# Patient Record
Sex: Male | Born: 1945
Health system: Southern US, Community
[De-identification: ages and names within clinical notes are randomized; demographics above are authoritative.]

## PROBLEM LIST (undated history)

## (undated) DIAGNOSIS — R062 Wheezing: Secondary | ICD-10-CM

## (undated) DIAGNOSIS — F419 Anxiety disorder, unspecified: Secondary | ICD-10-CM

## (undated) DIAGNOSIS — T7840XA Allergy, unspecified, initial encounter: Secondary | ICD-10-CM

## (undated) DIAGNOSIS — K922 Gastrointestinal hemorrhage, unspecified: Secondary | ICD-10-CM

## (undated) DIAGNOSIS — J45909 Unspecified asthma, uncomplicated: Secondary | ICD-10-CM

## (undated) DIAGNOSIS — B9562 Methicillin resistant Staphylococcus aureus infection as the cause of diseases classified elsewhere: Secondary | ICD-10-CM

## (undated) DIAGNOSIS — M109 Gout, unspecified: Secondary | ICD-10-CM

## (undated) DIAGNOSIS — G4730 Sleep apnea, unspecified: Secondary | ICD-10-CM

## (undated) DIAGNOSIS — IMO0001 Reserved for inherently not codable concepts without codable children: Secondary | ICD-10-CM

## (undated) DIAGNOSIS — F32A Depression, unspecified: Secondary | ICD-10-CM

## (undated) DIAGNOSIS — E785 Hyperlipidemia, unspecified: Secondary | ICD-10-CM

## (undated) DIAGNOSIS — I10 Essential (primary) hypertension: Secondary | ICD-10-CM

## (undated) DIAGNOSIS — G4700 Insomnia, unspecified: Secondary | ICD-10-CM

## (undated) DIAGNOSIS — G629 Polyneuropathy, unspecified: Secondary | ICD-10-CM

## (undated) DIAGNOSIS — E669 Obesity, unspecified: Secondary | ICD-10-CM

## (undated) DIAGNOSIS — K219 Gastro-esophageal reflux disease without esophagitis: Secondary | ICD-10-CM

## (undated) DIAGNOSIS — L0390 Cellulitis, unspecified: Secondary | ICD-10-CM

## (undated) DIAGNOSIS — I2510 Atherosclerotic heart disease of native coronary artery without angina pectoris: Secondary | ICD-10-CM

## (undated) DIAGNOSIS — J449 Chronic obstructive pulmonary disease, unspecified: Secondary | ICD-10-CM

## (undated) DIAGNOSIS — E291 Testicular hypofunction: Secondary | ICD-10-CM

## (undated) DIAGNOSIS — M797 Fibromyalgia: Secondary | ICD-10-CM

## (undated) DIAGNOSIS — I509 Heart failure, unspecified: Secondary | ICD-10-CM

## (undated) HISTORY — DX: Testicular hypofunction: E29.1

## (undated) HISTORY — DX: Gout, unspecified: M10.9

## (undated) HISTORY — DX: Wheezing: R06.2

## (undated) HISTORY — DX: Insomnia, unspecified: G47.00

## (undated) HISTORY — DX: Allergy, unspecified, initial encounter: T78.40XA

## (undated) HISTORY — PX: CARDIAC CATHETERIZATION: SHX172

## (undated) HISTORY — DX: Unspecified asthma, uncomplicated: J45.909

## (undated) HISTORY — DX: Sleep apnea, unspecified: G47.30

## (undated) HISTORY — DX: Polyneuropathy, unspecified: G62.9

## (undated) HISTORY — PX: BACK SURGERY: SHX140

## (undated) HISTORY — DX: Hyperlipidemia, unspecified: E78.5

## (undated) HISTORY — PX: COLONOSCOPY: SHX174

## (undated) HISTORY — DX: Essential (primary) hypertension: I10

## (undated) HISTORY — DX: Obesity, unspecified: E66.9

## (undated) HISTORY — PX: OTHER SURGICAL HISTORY: SHX169

## (undated) HISTORY — DX: Cellulitis, unspecified: L03.90

## (undated) HISTORY — DX: Atherosclerotic heart disease of native coronary artery without angina pectoris: I25.10

## (undated) HISTORY — DX: Chronic obstructive pulmonary disease, unspecified: J44.9

## (undated) HISTORY — DX: Fibromyalgia: M79.7

## (undated) HISTORY — DX: Cellulitis, unspecified: B95.62

## (undated) HISTORY — DX: Gastrointestinal hemorrhage, unspecified: K92.2

---

## 2003-01-22 ENCOUNTER — Ambulatory Visit (HOSPITAL_COMMUNITY): Admission: RE | Admit: 2003-01-22 | Discharge: 2003-01-22 | Payer: Self-pay | Admitting: *Deleted

## 2006-05-23 ENCOUNTER — Encounter: Admission: RE | Admit: 2006-05-23 | Discharge: 2006-05-23 | Payer: Self-pay | Admitting: Orthopedic Surgery

## 2006-10-28 ENCOUNTER — Ambulatory Visit: Payer: Self-pay | Admitting: Gastroenterology

## 2006-11-11 ENCOUNTER — Ambulatory Visit: Payer: Self-pay | Admitting: Gastroenterology

## 2006-11-11 ENCOUNTER — Encounter: Payer: Self-pay | Admitting: Gastroenterology

## 2006-11-23 ENCOUNTER — Inpatient Hospital Stay (HOSPITAL_COMMUNITY): Admission: EM | Admit: 2006-11-23 | Discharge: 2006-11-24 | Payer: Self-pay | Admitting: Emergency Medicine

## 2007-01-13 ENCOUNTER — Ambulatory Visit: Payer: Self-pay | Admitting: Gastroenterology

## 2007-01-23 ENCOUNTER — Ambulatory Visit: Payer: Self-pay | Admitting: Gastroenterology

## 2007-02-21 ENCOUNTER — Ambulatory Visit (HOSPITAL_COMMUNITY): Admission: RE | Admit: 2007-02-21 | Discharge: 2007-02-21 | Payer: Self-pay | Admitting: Family Medicine

## 2007-02-25 DIAGNOSIS — R5381 Other malaise: Secondary | ICD-10-CM | POA: Insufficient documentation

## 2007-02-25 DIAGNOSIS — M109 Gout, unspecified: Secondary | ICD-10-CM | POA: Insufficient documentation

## 2007-02-25 DIAGNOSIS — I10 Essential (primary) hypertension: Secondary | ICD-10-CM | POA: Insufficient documentation

## 2007-02-25 DIAGNOSIS — R5383 Other fatigue: Secondary | ICD-10-CM | POA: Insufficient documentation

## 2007-02-25 DIAGNOSIS — D126 Benign neoplasm of colon, unspecified: Secondary | ICD-10-CM | POA: Insufficient documentation

## 2007-02-25 DIAGNOSIS — Z9989 Dependence on other enabling machines and devices: Secondary | ICD-10-CM | POA: Insufficient documentation

## 2007-02-25 DIAGNOSIS — J45991 Cough variant asthma: Secondary | ICD-10-CM | POA: Insufficient documentation

## 2007-02-25 DIAGNOSIS — R079 Chest pain, unspecified: Secondary | ICD-10-CM | POA: Insufficient documentation

## 2007-02-25 DIAGNOSIS — R519 Headache, unspecified: Secondary | ICD-10-CM | POA: Insufficient documentation

## 2007-02-25 DIAGNOSIS — R51 Headache: Secondary | ICD-10-CM | POA: Insufficient documentation

## 2007-02-25 DIAGNOSIS — F411 Generalized anxiety disorder: Secondary | ICD-10-CM | POA: Insufficient documentation

## 2007-02-25 DIAGNOSIS — K219 Gastro-esophageal reflux disease without esophagitis: Secondary | ICD-10-CM | POA: Insufficient documentation

## 2007-02-25 DIAGNOSIS — E119 Type 2 diabetes mellitus without complications: Secondary | ICD-10-CM | POA: Insufficient documentation

## 2007-02-25 DIAGNOSIS — G4733 Obstructive sleep apnea (adult) (pediatric): Secondary | ICD-10-CM | POA: Insufficient documentation

## 2008-02-19 ENCOUNTER — Ambulatory Visit (HOSPITAL_COMMUNITY): Admission: RE | Admit: 2008-02-19 | Discharge: 2008-02-19 | Payer: Self-pay | Admitting: Family Medicine

## 2008-11-27 ENCOUNTER — Ambulatory Visit (HOSPITAL_COMMUNITY): Admission: RE | Admit: 2008-11-27 | Discharge: 2008-11-27 | Payer: Self-pay | Admitting: Family Medicine

## 2008-12-19 ENCOUNTER — Encounter: Admission: RE | Admit: 2008-12-19 | Discharge: 2008-12-19 | Payer: Self-pay | Admitting: Neurosurgery

## 2009-01-20 ENCOUNTER — Ambulatory Visit (HOSPITAL_COMMUNITY): Admission: RE | Admit: 2009-01-20 | Discharge: 2009-01-21 | Payer: Self-pay | Admitting: Neurosurgery

## 2009-04-15 ENCOUNTER — Encounter: Admission: RE | Admit: 2009-04-15 | Discharge: 2009-04-15 | Payer: Self-pay | Admitting: Neurosurgery

## 2009-12-05 ENCOUNTER — Inpatient Hospital Stay (HOSPITAL_COMMUNITY)
Admission: AD | Admit: 2009-12-05 | Discharge: 2009-12-09 | Payer: Self-pay | Source: Home / Self Care | Admitting: Cardiology

## 2010-01-31 ENCOUNTER — Encounter
Admission: RE | Admit: 2010-01-31 | Discharge: 2010-01-31 | Payer: Self-pay | Source: Home / Self Care | Attending: Neurosurgery | Admitting: Neurosurgery

## 2010-04-15 ENCOUNTER — Other Ambulatory Visit: Payer: Self-pay | Admitting: Neurosurgery

## 2010-04-15 DIAGNOSIS — M545 Low back pain, unspecified: Secondary | ICD-10-CM

## 2010-04-15 DIAGNOSIS — M549 Dorsalgia, unspecified: Secondary | ICD-10-CM

## 2010-04-21 LAB — HEMOGLOBIN A1C
Hgb A1c MFr Bld: 6.2 % — ABNORMAL HIGH (ref <5.7)
Mean Plasma Glucose: 131 mg/dL — ABNORMAL HIGH (ref <117)

## 2010-04-21 LAB — BASIC METABOLIC PANEL
BUN: 12 mg/dL (ref 6–23)
CO2: 30 mEq/L (ref 19–32)
Calcium: 9.3 mg/dL (ref 8.4–10.5)
Chloride: 103 mEq/L (ref 96–112)
Creatinine, Ser: 0.8 mg/dL (ref 0.4–1.5)
GFR calc Af Amer: >60 mL/min (ref >60)
GFR calc non Af Amer: >60 mL/min (ref >60)
Glucose, Bld: 105 mg/dL — ABNORMAL HIGH (ref 70–99)
Potassium: 4.1 mEq/L (ref 3.5–5.1)
Sodium: 142 mEq/L (ref 135–145)

## 2010-04-21 LAB — GLUCOSE, CAPILLARY: Glucose-Capillary: 126 mg/dL — ABNORMAL HIGH (ref 70–99)

## 2010-04-22 ENCOUNTER — Ambulatory Visit
Admission: RE | Admit: 2010-04-22 | Discharge: 2010-04-22 | Disposition: A | Discharge status: Home / Self Care | Payer: Medicare Other | Source: Ambulatory Visit | Attending: Neurosurgery | Admitting: Neurosurgery

## 2010-04-22 DIAGNOSIS — M545 Low back pain, unspecified: Secondary | ICD-10-CM

## 2010-04-22 DIAGNOSIS — M549 Dorsalgia, unspecified: Secondary | ICD-10-CM

## 2010-04-22 LAB — CBC
HCT: 47.1 % (ref 39.0–52.0)
Hemoglobin: 14.9 g/dL (ref 13.0–17.0)
Hemoglobin: 15.6 g/dL (ref 13.0–17.0)
MCH: 27.8 pg (ref 26.0–34.0)
MCH: 28 pg (ref 26.0–34.0)
MCH: 28.2 pg (ref 26.0–34.0)
MCHC: 32.8 g/dL (ref 30.0–36.0)
MCHC: 33.4 g/dL (ref 30.0–36.0)
MCV: 84.4 fL (ref 78.0–100.0)
MCV: 84.5 fL (ref 78.0–100.0)
MCV: 84.8 fL (ref 78.0–100.0)
Platelets: 208 10*3/uL (ref 150–400)
Platelets: 213 10*3/uL (ref 150–400)
RBC: 5.58 MIL/uL (ref 4.22–5.81)
RDW: 13.6 % (ref 11.5–15.5)
RDW: 13.8 % (ref 11.5–15.5)
WBC: 7.1 10*3/uL (ref 4.0–10.5)

## 2010-04-22 LAB — COMPREHENSIVE METABOLIC PANEL
ALT: 26 U/L (ref 0–53)
AST: 21 U/L (ref 0–37)
Albumin: 3.9 g/dL (ref 3.5–5.2)
Albumin: 4 g/dL (ref 3.5–5.2)
Alkaline Phosphatase: 62 U/L (ref 39–117)
BUN: 10 mg/dL (ref 6–23)
BUN: 10 mg/dL (ref 6–23)
BUN: 10 mg/dL (ref 6–23)
BUN: 10 mg/dL (ref 6–23)
CO2: 29 mEq/L (ref 19–32)
CO2: 29 mEq/L (ref 19–32)
Calcium: 8.8 mg/dL (ref 8.4–10.5)
Calcium: 9.3 mg/dL (ref 8.4–10.5)
Chloride: 102 mEq/L (ref 96–112)
Chloride: 103 mEq/L (ref 96–112)
Chloride: 103 mEq/L (ref 96–112)
Creatinine, Ser: 0.66 mg/dL (ref 0.4–1.5)
Creatinine, Ser: 0.79 mg/dL (ref 0.4–1.5)
Creatinine, Ser: 0.86 mg/dL (ref 0.4–1.5)
GFR calc Af Amer: >60 mL/min (ref >60)
GFR calc non Af Amer: >60 mL/min (ref >60)
GFR calc non Af Amer: >60 mL/min (ref >60)
Glucose, Bld: 116 mg/dL — ABNORMAL HIGH (ref 70–99)
Glucose, Bld: 99 mg/dL (ref 70–99)
Potassium: 3.8 mEq/L (ref 3.5–5.1)
Sodium: 139 mEq/L (ref 135–145)
Total Bilirubin: 0.4 mg/dL (ref 0.3–1.2)
Total Bilirubin: 0.6 mg/dL (ref 0.3–1.2)
Total Bilirubin: 0.8 mg/dL (ref 0.3–1.2)
Total Protein: 6.1 g/dL (ref 6.0–8.3)

## 2010-04-22 LAB — GLUCOSE, CAPILLARY
Glucose-Capillary: 103 mg/dL — ABNORMAL HIGH (ref 70–99)
Glucose-Capillary: 113 mg/dL — ABNORMAL HIGH (ref 70–99)
Glucose-Capillary: 121 mg/dL — ABNORMAL HIGH (ref 70–99)
Glucose-Capillary: 128 mg/dL — ABNORMAL HIGH (ref 70–99)
Glucose-Capillary: 129 mg/dL — ABNORMAL HIGH (ref 70–99)
Glucose-Capillary: 131 mg/dL — ABNORMAL HIGH (ref 70–99)
Glucose-Capillary: 150 mg/dL — ABNORMAL HIGH (ref 70–99)

## 2010-04-22 LAB — HEPATIC FUNCTION PANEL
ALT: 22 U/L (ref 0–53)
AST: 22 U/L (ref 0–37)
Bilirubin, Direct: 0.2 mg/dL (ref 0.0–0.3)
Total Protein: 6.2 g/dL (ref 6.0–8.3)

## 2010-04-22 LAB — CARDIAC PANEL(CRET KIN+CKTOT+MB+TROPI)
CK, MB: 1.1 ng/mL (ref 0.3–4.0)
CK, MB: 2.4 ng/mL (ref 0.3–4.0)
Relative Index: INVALID (ref 0.0–2.5)
Total CK: 67 U/L (ref 7–232)
Troponin I: <0.01 ng/mL (ref 0.00–0.06)
Troponin I: <0.01 ng/mL (ref 0.00–0.06)

## 2010-04-22 LAB — AMYLASE: Amylase: 36 U/L (ref 0–105)

## 2010-05-12 LAB — BASIC METABOLIC PANEL
CO2: 30 mEq/L (ref 19–32)
Calcium: 9.4 mg/dL (ref 8.4–10.5)
Chloride: 103 mEq/L (ref 96–112)
Creatinine, Ser: 0.86 mg/dL (ref 0.4–1.5)
Glucose, Bld: 99 mg/dL (ref 70–99)

## 2010-05-12 LAB — CBC
Hemoglobin: 16 g/dL (ref 13.0–17.0)
MCHC: 34.1 g/dL (ref 30.0–36.0)
MCV: 86.7 fL (ref 78.0–100.0)
RDW: 15.1 % (ref 11.5–15.5)

## 2010-05-12 LAB — GLUCOSE, CAPILLARY
Glucose-Capillary: 161 mg/dL — ABNORMAL HIGH (ref 70–99)
Glucose-Capillary: 179 mg/dL — ABNORMAL HIGH (ref 70–99)

## 2010-06-23 NOTE — Cardiovascular Report (Signed)
NAME:  CAYDON, FEASEL NO.:  [id number]   MEDICAL RECORD NO.:  [id number]          PATIENT TYPE:  INP   LOCATION:  2033                         FACILITY:  [facility]   PHYSICIAN:  Octavia Heir, MD  DATE OF BIRTH:  1946/01/03   DATE OF PROCEDURE:  11/23/2006  DATE OF DISCHARGE:                            CARDIAC CATHETERIZATION   Mr. Carranza is a 65 year old male patient of mine with a history of  hypertension, noninsulin dependent diabetes mellitus, gout who is status  post abnormal Cardiolite in 2004 with subsequent cardiac catheterization  in December 2004 with no significant CAD with a normal EF.  He recently  has complained of increasing fatigue and was seen by his primary MD on  November 22, 2006 with the complaint of suffering from chest pain  radiating to his left arm.  He was given several nitroglycerin, however,  the patient had a syncopal episode following that with a pressure of 70.  He was subsequently admitted to the hospital for rule out MI.  He did  subsequently rule out with negative enzymes.  He is now brought for  cardiac catheterization to reassess his coronary anatomy.   DESCRIPTION OF PROCEDURE:  After obtaining informed consent, the patient  was brought into the cardiac catheterization laboratory where the right  groin is shaved, prepped and draped in the sterile fashion.  ECG  monitoring was established.  Using modified Seldinger technique, a #6  French arterial sheath inserted up the right femoral artery.  The 6  French diagnostic catheter was used to perform diagnostic angiography.   The left main is a short vessel with no significant disease.   The LAD is a medium to large vessel which courses through the  two  diagonal branches.  The LAD has no significant disease.   The first and second diagonals are small vessels with no significant  disease.   The left circumflex is a large vessel coursing through and gives rise to  one obtuse  marginal as well as a PDA and is noted to be codominant.  AV  view of circumflex, there is no significant disease.   The first OM is a medium size vessel which bifurcates with no  significant disease.   PDA off the circumflex has no significant disease.   The right coronary artery is a medium to large vessel which is also  noted to be codominant and gives rise to a PDA.  There is on significant  disease in the RCA or PDA.   Left ventriculogram reveals a low normal EF of approximately 50%.   HEMODYNAMICS:  System in regard to pressure 99/66, LV significant  pressure 9/3, LV to PF 8.   CONCLUSION:  1. No significant coronary artery disease.  2. Low normal ejection fraction.      Octavia Heir, MD  Electronically Signed     RHM/MEDQ  D:  11/23/2006  T:  11/24/2006  Job:  [id number]

## 2010-06-23 NOTE — Assessment & Plan Note (Signed)
[facility] [place] OFFICE NOTE   HUMBERT, MOROZOV                     MRN:          [id number]  DATE:01/13/2007                            DOB:          11-13-1945    REASON FOR REFERRAL:  Dr. Laurance Flatten asked me to evaluate Mr. Clerk in  consultation regarding intermittent dysphagia and atypical chest pain.   HISTORY OF PRESENT ILLNESS:  Kenneth Fuller is a very pleasant 65-year-  old man who has had several months of atypical chest pain. He describes  a tenderness in his sternum, mid-sternum, and left sternum border. This  has been going on for several months. He actually presented to the  emergency room with this pain and with some other symptoms such as  nausea and diaphoresis. He was evaluated from a cardiac perspective and  underwent chest CT. His coronary workup was essentially negative. He  actually had an angiogram that was essentially normal. He had normal LV  function. He had a CT scan while he was hospitalized and this suggested  some peribronchial soft tissue and he was recommended to have follow up  CT in three months and that is already scheduled for mid-January.   He also has chronic GERD. He says that for years he has had on and off  heart, more typical pyrosis, acid regurgitation. He will have  intermittent, once-a-month or so, solid food dysphagia.   REVIEW OF SYSTEMS:  Notable for a 30 pound weight loss since he began  dieting in June 2008. Otherwise, essentially normal and is available on  his nursing intake sheet.   PAST MEDICAL HISTORY:  Hypertension, asthma, diabetes, anxiety, chronic  headaches, sleep apnea, history of gout, history of chronic fatigue  syndrome, type-2 diabetes, personal history of colon polyps status post  colonoscopy 11/2006, small tubular adenoma removed. He is scheduled for  repeat colonoscopy in 2013.   CURRENT MEDICATIONS:  1. Nexium 1 pill approximately 1 hour  prior to his breakfast meal.  2. Aspirin.  3. Fish oil.  4. Blood pressure medicine of which he does not know the name of.   ALLERGIES:  No known drug allergies.   SOCIAL HISTORY:  He is married with three children. He is not working  due to disability. Non-smoker and non-drinker.   FAMILY HISTORY:  Alcoholism and diabetes runs in his family. Mother with  colon cancer. Mother with ulcerative colitis. Brother and sister with  liver disease.   PHYSICAL EXAMINATION:  VITAL SIGNS:  Height 5 foot 7 inches, weight 246  pounds, blood pressure 122/78, pulse 80.  CONSTITUTIONAL:  Generally well appearing.  NEUROLOGIC:  Awake, alert, and oriented x3.  EYES:  Extraocular movements intact.  MOUTH:  Oropharynx moist, no lesions.  NECK:  Supple. No lymphadenopathy.  HEART:  Regular rate and rhythm.  CHEST:  His sternum is tender to palpation.  LUNGS:  Clear to auscultation bilaterally.  ABDOMEN:  Soft and nontender, nondistended, normal bowel sounds.  EXTREMITIES:  No lower extremity edema.  SKIN:  No rashes or lesions on visible extremities.   ASSESSMENT AND PLAN:  This is a 65 year old  man with atypical chest  pain, chest wall tenderness, GERD with intermittent dysphagia.   I do not think that his chest pains are esophageal related. His chest  wall is actually tender, so I do think this is musculoskeletal  predominantly. I have recommended that he take 1 to 2 extra strength  Tylenol twice daily to see if that helps. He does have chronic GERD  symptoms that are unrelated to his chest wall pain and he takes Nexium  once daily. He does, however, take the Nexium at the incorrect time in  relation to food and so I recommended that he take it 20 to 30 minutes  prior to his breakfast meal rather than an hour prior. He does have  intermittent dysphagia and for that reason and for the fact that he has  had chronic GERD symptoms, I will arrange for him to have an EGD  performed at his soonest  convenience.     Milus Banister, MD  Electronically Signed    DPJ/MedQ  DD: 01/13/2007  DT: 01/14/2007  Job #: [id number]   cc:   Chipper Herb, M.D.

## 2010-06-23 NOTE — H&P (Signed)
Kenneth Fuller, JARNIGAN NO.:  [id number]   MEDICAL RECORD NO.:  [id number]          PATIENT TYPE:  INP   LOCATION:  2033                         FACILITY:  [facility]   PHYSICIAN:  Octavia Heir, MD  DATE OF BIRTH:  Jun 30, 1945   DATE OF ADMISSION:  11/22/2006  DATE OF DISCHARGE:  11/24/2006                              HISTORY & PHYSICAL   CHIEF COMPLAINT:  Chest pain.   HISTORY OF PRESENT ILLNESS:  Mr. Stetzer is a 65 year old male with a  history of an abnormal Myoview in 2004.  Catheterization revealed normal  cores and normal LV function in December of 2004.  He does have a  history of hypertension.  He has normal renal arteries.  He had an  echocardiogram in May of 2008 that showed normal LV function with mild  LVH.  The patient is seen now as a transfer from his primary care  doctor's office by EMS.  He apparently had had some sharp chest pain  which went into his left arm.  He went to his primary care's office.  He  received a nitroglycerin and then became hypotensive.  He is transferred  now via EMS.  The patient does admit to increasing chest pain when he is  under stress and he feels like he is under some increased stress at  home.   MEDICATIONS:  His medications as best we can tell are:  1. Hyzaar 100/25 once a day.  2. Nexium 40 mg a day.  3. Aspirin daily.  4. Lasix 40 mg a day.  5. Allopurinol 100 mg a day.   PAST MEDICAL HISTORY:  His past medical history is remarkable for:  1. Hypertension.  2. Gout.  3. Chronic fatigue syndrome.  4. Non-insulin-dependent diabetes, diet-controlled.   ALLERGIES:  HE HAS NO KNOWN DRUG ALLERGIES, although he has been  intolerant to calcium blockers in the past because of lower extremity  edema.   SOCIAL HISTORY:  He is disabled.  He is a nonsmoker.  He is married.   FAMILY HISTORY:  Remarkable for coronary artery disease, his father died  at 24 of an MI.  He has two brothers with coronary disease.   REVIEW OF SYSTEMS:  He apparently had a syncopal spell after he got up  to use the bathroom last Thursday night.  He has had chest pain off and  on for several years.   PHYSICAL EXAMINATION:  VITAL SIGNS:  Blood pressure 114/76.  Pulse of  86.  Respirations 16.  GENERAL:  He is a well-developed, anxious male in no acute distress.  HEENT:  Normocephalic.  Extraocular movements are intact.  Sclerae are  nonicteric.  NECK:  Without JVD or bruits.  CHEST:  Clear to auscultation and percussion.  CARDIOVASCULAR EXAM:  Reveals regular rate and rhythm without murmurs,  rubs or gallops.  Normal S1, S2.  ABDOMEN:  Nontender, not distended, obese, soft, bowel sounds are  present.  EXTREMITIES:  Without edema.  Distal pulses are 3+/4.  NEUROLOGIC EXAM:  Grossly intact.   LABORATORY DATA:  His EKG shows sinus rhythm without acute changes.  IMPRESSION:  1. Syncope, probably secondary to orthostatic hypotension.  2. Chest pain, normal coronaries in December of 2004 after an abnormal      Myoview study.  3. Treated hypertension, now hypotensive.  4. History of gout.  5. History of stress.  6. Chronic fatigue syndrome.   PLAN:  The patient was seen by Dr. Leslye Peer in the emergency room.  He will be admitted to telemetry.  We will start IV heparin and rule out  MI.  CT scan will be obtained to rule out dissection or pulmonary  embolism.      Erlene Quan, P.A.      Octavia Heir, MD  Electronically Signed    LKK/MEDQ  D:  11/24/2006  T:  11/25/2006  Job:  [id number]   cc:   Chipper Herb, M.D.  Milus Banister, MD

## 2010-06-23 NOTE — Discharge Summary (Signed)
NAMEANDRW, MCGUIRT NO.:  [id number]   MEDICAL RECORD NO.:  [id number]          PATIENT TYPE:  INP   LOCATION:  2033                         FACILITY:  [facility]   PHYSICIAN:  Octavia Heir, MD  DATE OF BIRTH:  1945/06/24   DATE OF ADMISSION:  11/22/2006  DATE OF DISCHARGE:  11/24/2006                               DISCHARGE SUMMARY   DISCHARGE DIAGNOSES:  1. Chest pain, normal coronaries this admission with negative chest CT      this admission.  2. History of hypertension, now somewhat hypotensive with near syncope      on admission.  3. Non-insulin-dependent diabetes, diet-controlled.  4. Chronic fatigue syndrome.  5. History of gout.  6. Emotional stress.   HOSPITAL COURSE:  Mr. Sotomayor is a 65 year old male followed by Dr.  Alla German and seen by Dr. Redge Gainer.  He had an abnormal Myoview  study in 2004.  Catheterization revealed normal coronaries and normal LV  function in December of 2004.  He does have hypertension. He has had  normal renal arteries.  Echocardiogram in May of 2008 showed normal LV  function with mild LVH.  He was admitted November 22, 2006 as a transfer  from his family doctor's office.  He apparently had been having some  sharp left-sided chest pain.  He received a nitroglycerin at his family  doctor's office and became hypotensive.  He was transferred by EMS to  [facility].  CT scan was obtained on admission which was negative for  dissection or pulmonary embolism.  He does have some nonspecific  findings on his CT scan which need a follow-up study in three to six  months.  The patient underwent diagnostic catheterization on November 23, 2006 which revealed essentially normal coronaries and normal LV function  with an EF of 50%.  He tolerated this well.  He requested GI consult, he  apparently has seen Dr. Owens Loffler in the past.  He was seen by  [facility].  Ultimately, they felt the best management would be with  proton  pump inhibitor and they will see him as an outpatient p.r.n.  It  was noted that the patient had lost 30 pounds in the last five or six  months.  This may have contributed to his drop in blood pressure.   DISCHARGE MEDICATIONS:  Discharge medications will be:  1. Nexium 40 mg a day.  2. Allopurinol 100 mg a day.  3. Xanax and Ambien p.r.n.   LABORATORY DATA:  White count 6.0, hemoglobin 13.7, hematocrit 40.9,  platelets 215,000.  Sodium 137, potassium 3.6, BUN 4, creatinine 0.9,  glucose 129.  His LFTs are normal.  His EKG shows normal sinus rhythm  with nonspecific ST changes.  CT scan of his chest showed no aortic  dissection.  He did have prominent hilar peribronchial vascular soft  tissue changes, a follow-up scan was recommended in three to six months.  CT of his abdomen done at the same time showed no aortic dissection with  an enlarged portal node.  There were  incidental findings of DJD in the  lower lumbar spine.  Again, a follow-up CT of his abdomen for this  enlarged portal node was recommended in three to six months.   DISPOSITION:  The patient is discharged in stable condition.  He will  follow-up with Dr. Tami Ribas in a few weeks in the office.  We may need to  resume some antihypertensives in the future, I am not sure if he has  been on an ACE  inhibitor or not in the past.  He apparently cannot tolerate calcium  blockers.  He has also been instructed to follow-up with his primary  care doctor.  It may be that he would benefit from an SSRI, but we will  defer that to his primary care doctor.  He knows to get hold of Dr.  Ardis Hughs p.r.n.      Erlene Quan, P.A.      Octavia Heir, MD  Electronically Signed    LKK/MEDQ  D:  11/24/2006  T:  11/25/2006  Job:  [id number]   cc:   Chipper Herb, M.D.

## 2010-06-24 ENCOUNTER — Encounter: Payer: Self-pay | Admitting: Physician Assistant

## 2010-06-26 NOTE — Cardiovascular Report (Signed)
NAME:  Kenneth Fuller, Kenneth Fuller NO.:  [id number]   MEDICAL RECORD NO.:  [id number]                   PATIENT TYPE:  OIB   LOCATION:  2871                                 FACILITY:  [facility]   PHYSICIAN:  Octavia Heir, M.D.             DATE OF BIRTH:  Jan 15, 1946   DATE OF PROCEDURE:  01/22/2003  DATE OF DISCHARGE:                              CARDIAC CATHETERIZATION   PROCEDURES PERFORMED:  1. Left heart catheterization.  2. Coronary angiography.  3. Left ventriculogram.  4. Abdominal aortogram.   ATTENDING:  Octavia Heir, M.D.   COMPLICATIONS:  None.   INDICATIONS:  Kenneth Fuller is a 65 year old male patient of Dr. Tamera Stands, with a history of hypertension, obesity, unknown lipid status who  recently complained of substernal chest pain and shortness of breath at  rest.  He did undergo a Cardiolite scan in [place] revealing inferior  wall ischemia with a normal EF.  Because of his ongoing symptoms, positive  Cardiolite scan, he is now referred for cardiac catheterization to assess  his coronary status.   DESCRIPTION OF OPERATION:  After giving informed written consent, patient  brought to the cardiac catheterization laboratory.  Right and left groin  shaved, prepped and draped in usual sterile fashion.  ECG monitor  established.  Using a modified Seldinger technique, a number 6-French  arterial sheath inserted in right femoral artery.  A 6-French diagnostic  catheter was then used to perform diagnostic angiography.  This reveals a  large left main which is short.  The LAD is a large vessel coursing the  apex, giving rise to two diagonal branches.  The LAD has no significant  disease.  First and second diagonal branches are large vessels with no  significant disease.   Left circumflex is a large vessel coursing the AV groove and giving rise to  two obtuse marginal branches.  The AV groove circumflex has no significant  disease.  The first  and second OMs are medium sized vessels with no  significant disease.   The right coronary artery is a large vessel, dominant.  Gives rise to both  PDA/posterolateral branch.  There is no significant disease in the RCA, PDA,  or posterolateral branch.   Left ventriculogram reveals preserved EF of 60%.   Abdominal aortogram reveals no evidence of renal artery stenosis.   HEMODYNAMICS:  Systemic arterial pressure 137/94, LV systemic pressure  130/16, LVEDP 22.   CONCLUSION:  1. No significant coronary artery disease.  2. Normal left ventricular systolic function.  3. No evidence of renal artery stenosis.  4. Systemic hypertension.                                               Octavia Heir, M.D.    RHM/MEDQ  D:  01/22/2003  T:  01/22/2003  Job:  [id number]   cc:   Tamera Stands, M.D.  [place], [place]

## 2010-11-18 LAB — CBC
Hemoglobin: 13.7
Hemoglobin: 14
MCHC: 33.5
MCHC: 34
RBC: 4.9
RBC: 4.96
WBC: 6
WBC: 6.8

## 2010-11-18 LAB — BASIC METABOLIC PANEL
Calcium: 7.5 — ABNORMAL LOW
Creatinine, Ser: 0.95
GFR calc Af Amer: >60
GFR calc non Af Amer: >60
Sodium: 137

## 2010-11-18 LAB — TROPONIN I: Troponin I: 0.01

## 2010-11-18 LAB — HEPARIN LEVEL (UNFRACTIONATED): Heparin Unfractionated: 0.78 — ABNORMAL HIGH

## 2010-11-18 LAB — CK TOTAL AND CKMB (NOT AT ARMC)
CK, MB: 2.6
CK, MB: 3
Relative Index: 1.5
Relative Index: 1.6
Total CK: 162

## 2010-11-19 LAB — CK TOTAL AND CKMB (NOT AT ARMC): Total CK: 206

## 2010-11-19 LAB — CBC
Hemoglobin: 14.4
MCHC: 33.5
MCV: 82.5
RBC: 5.19
WBC: 8.4

## 2010-11-19 LAB — URINALYSIS, ROUTINE W REFLEX MICROSCOPIC
Hgb urine dipstick: NEGATIVE
Protein, ur: NEGATIVE
Urobilinogen, UA: 1

## 2010-11-19 LAB — AMYLASE: Amylase: 52

## 2010-11-19 LAB — COMPREHENSIVE METABOLIC PANEL
ALT: 50
AST: 32
Alkaline Phosphatase: 64
CO2: 30
Calcium: 9.4
GFR calc Af Amer: >60
GFR calc non Af Amer: 50 — ABNORMAL LOW
Glucose, Bld: 94
Potassium: 3.1 — ABNORMAL LOW
Sodium: 140
Total Protein: 6.3

## 2010-11-19 LAB — LIPID PANEL
Cholesterol: 154
HDL: 32 — ABNORMAL LOW
LDL Cholesterol: 83
Total CHOL/HDL Ratio: 4.8
Triglycerides: 195 — ABNORMAL HIGH
VLDL: 39

## 2010-11-19 LAB — DIFFERENTIAL
Basophils Relative: 1
Eosinophils Absolute: 0.1
Eosinophils Relative: 2
Lymphs Abs: 2.7
Monocytes Relative: 10

## 2010-11-19 LAB — URINE MICROSCOPIC-ADD ON

## 2010-11-19 LAB — TSH: TSH: 1.567

## 2010-11-19 LAB — LIPASE, BLOOD: Lipase: 29

## 2010-11-19 LAB — PROTIME-INR: Prothrombin Time: 13.9

## 2010-11-19 LAB — HEMOGLOBIN A1C
Hgb A1c MFr Bld: 6.2 — ABNORMAL HIGH
Mean Plasma Glucose: 143

## 2010-11-19 LAB — TROPONIN I: Troponin I: 0.01

## 2010-11-19 LAB — C-REACTIVE PROTEIN: CRP: 0.8 — ABNORMAL HIGH (ref <0.6)

## 2010-11-19 LAB — D-DIMER, QUANTITATIVE: D-Dimer, Quant: <0.22

## 2011-01-12 ENCOUNTER — Encounter: Payer: Self-pay | Admitting: Gastroenterology

## 2011-01-19 ENCOUNTER — Telehealth: Payer: Self-pay | Admitting: Gastroenterology

## 2011-01-19 NOTE — Telephone Encounter (Signed)
Pt has been rescheduled to 01/20/11,Kenneth Fuller will notify pt.

## 2011-01-20 ENCOUNTER — Ambulatory Visit (INDEPENDENT_AMBULATORY_CARE_PROVIDER_SITE_OTHER): Payer: PRIVATE HEALTH INSURANCE | Admitting: Gastroenterology

## 2011-01-20 VITALS — BP 128/82 | HR 62 | Ht 67.0 in | Wt 288.0 lb

## 2011-01-20 DIAGNOSIS — Z860100 Personal history of colon polyps, unspecified: Secondary | ICD-10-CM

## 2011-01-20 DIAGNOSIS — K625 Hemorrhage of anus and rectum: Secondary | ICD-10-CM

## 2011-01-20 MED ORDER — PEG-KCL-NACL-NASULF-NA ASC-C 100 G PO SOLR: 1.0000 | ORAL | Status: DC

## 2011-01-20 NOTE — Progress Notes (Signed)
HPI: This is a  very pleasant 65 year old man  Whom I last sw in 2008.  He has a gnawing, hunger like pain.  Eating makes the pain worse actually.  Had some fresh red blood in stool, started a month ago.  Had dripping blood.  Yesterday had blood with the BM.  No unusual anal symptoms (itching, pains, fullness). Didn't feel any hemorrhoid bumps.   Has his usual minor alternating stools, but no dramatic constipation prior to the bleeding.  He had labs done last week but does not know the results of those.  I performed a colonoscopy October 2008. I found a small tubular adenoma and recommended repeat colonoscopy at 5 year interval. He also had an EGD December 2008 that was done for chest discomfort, it was normal.    Review of systems: Pertinent positive and negative review of systems were noted in the above HPI section. Complete review of systems was performed and was otherwise normal.    Past Medical History  Diagnosis Date  . MRSA cellulitis   . Diabetes mellitus   . Hypogonadism male   . Obesity   . Hypertension   . Hyperlipidemia   . GI bleeding   . Fibromyalgia   . Sleep apnea     Past Surgical History  Procedure Date  . Neck fusion   . Back surgery     Current Outpatient Prescriptions  Medication Sig Dispense Refill  . aliskiren (TEKTURNA) 150 MG tablet Take 150 mg by mouth daily.        [name] amLODipine (NORVASC) 10 MG tablet Take 5 mg by mouth daily.        [name] aspirin 81 MG tablet Take 81 mg by mouth daily.        . diazepam (VALIUM) 5 MG tablet Take 5 mg by mouth every 8 (eight) hours as needed.        . etodolac (LODINE) 500 MG tablet Take 500 mg by mouth 2 (two) times daily.        . furosemide (LASIX) 40 MG tablet Take 80 mg by mouth daily.        . hydrochlorothiazide 25 MG tablet Take 25 mg by mouth daily.        [name] losartan (COZAAR) 100 MG tablet Take 100 mg by mouth daily.        . metFORMIN (GLUCOPHAGE) 500 MG tablet Take 500 mg by mouth daily.        .  metoprolol (LOPRESSOR) 50 MG tablet Take 50 mg by mouth 2 (two) times daily.        [name] omeprazole (PRILOSEC) 40 MG capsule Take 40 mg by mouth daily.        . potassium chloride (KLOR-CON) 10 MEQ CR tablet Take 10 mEq by mouth 2 (two) times daily.        . pravastatin (PRAVACHOL) 40 MG tablet Take 40 mg by mouth daily.          Allergies as of 01/20/2011 - Review Complete 01/20/2011  Allergen Reaction Noted  . Lotrel  06/24/2010    Family History  Problem Relation Age of Onset  . Colon cancer Mother   . Diabetes Father     siblings  . Heart disease Father     brother  . Kidney disease Sister     History   Social History  . Marital Status: Married    Spouse Name: N/A    Number of Children: 3  . Years of Education:  N/A   Occupational History  . retired    Social History Main Topics  . Smoking status: Former Research scientist (life sciences)  . Smokeless tobacco: Never Used  . Alcohol Use: No  . Drug Use: No  . Sexually Active: Not on file   Other Topics Concern  . Not on file   Social History Narrative  . No narrative on file       Physical Exam: BP 128/82  Pulse 62  Ht _0  (1.702 m)  Wt 288 lb (130.636 kg)  BMI 45.11 kg/m2  SpO2 97% Constitutional: generally well-appearing Psychiatric: alert and oriented x3 Eyes: extraocular movements intact Mouth: oral pharynx moist, no lesions Neck: supple no lymphadenopathy Cardiovascular: heart regular rate and rhythm Lungs: clear to auscultation bilaterally Abdomen: soft, nontender, nondistended, no obvious ascites, no peritoneal signs, normal bowel sounds Extremities: no lower extremity edema bilaterally Skin: no lesions on visible extremities Rectal exam: No stool in vault, no external anal hemorrhoids, no anal fissures, no masses in the distal rectum.   Assessment and plan: 65 y.o. male with  minor rectal bleeding, alternating bowel habits, history of tubular adenoma  He was due for surveillance colonoscopy in 2013 and we will  speed that up a bit given his new rectal bleeding. I suspect this is anorectal in origin however his examination today found no clear external or internal anal hemorrhoids or fissures.  We will get his records sent over from his recent labs at his primary care office. He does not appear to be anemic clinically.  I asked him this will probably end up being hemorrhoids and I have started him on fiber supplements to try to even out his alternating bowel habits. This usually results in less anal trauma and can decrease hemorrhoids.

## 2011-01-20 NOTE — Patient Instructions (Signed)
You will be set up for a colonoscopy. We will get lab results from Dr. Tawanna Sat office from last week's visit. Please start taking citrucel (orange flavored) powder fiber supplement.  This may cause some bloating at first but that usually goes away. Begin with a small spoonful and work your way up to a large, heaping spoonful daily over a week.

## 2011-01-21 ENCOUNTER — Telehealth: Payer: Self-pay | Admitting: Gastroenterology

## 2011-01-21 NOTE — Telephone Encounter (Signed)
CBC dated December 2012, done by his primary care office showed normal white count, normal hemoglobin, normal platelets.

## 2011-01-22 ENCOUNTER — Ambulatory Visit (AMBULATORY_SURGERY_CENTER): Payer: PRIVATE HEALTH INSURANCE | Admitting: Gastroenterology

## 2011-01-22 ENCOUNTER — Encounter: Payer: Self-pay | Admitting: Gastroenterology

## 2011-01-22 VITALS — BP 158/83 | HR 92 | Temp 100.3°F | Resp 16 | Ht 67.0 in | Wt 288.0 lb

## 2011-01-22 DIAGNOSIS — D126 Benign neoplasm of colon, unspecified: Secondary | ICD-10-CM

## 2011-01-22 DIAGNOSIS — K625 Hemorrhage of anus and rectum: Secondary | ICD-10-CM

## 2011-01-22 DIAGNOSIS — K648 Other hemorrhoids: Secondary | ICD-10-CM

## 2011-01-22 DIAGNOSIS — Z1211 Encounter for screening for malignant neoplasm of colon: Secondary | ICD-10-CM

## 2011-01-22 DIAGNOSIS — Z86010 Personal history of colonic polyps: Secondary | ICD-10-CM

## 2011-01-22 LAB — GLUCOSE, CAPILLARY
Glucose-Capillary: 150 mg/dL — ABNORMAL HIGH (ref 70–99)
Glucose-Capillary: 124 mg/dL — ABNORMAL HIGH (ref 70–99)

## 2011-01-22 MED ORDER — SODIUM CHLORIDE 0.9 % IV SOLN: 500.0000 mL | INTRAVENOUS | Status: DC

## 2011-01-22 NOTE — Progress Notes (Signed)
Patient did not experience any of the following events: a burn prior to discharge; a fall within the facility; wrong site/side/patient/procedure/implant event; or a hospital transfer or hospital admission upon discharge from the facility. (G8907) Patient did not have preoperative order for IV antibiotic SSI prophylaxis. (G8918)  

## 2011-01-22 NOTE — Op Note (Signed)
[facility] [address] [place], [place]  [id number]  COLONOSCOPY PROCEDURE REPORT  PATIENT:  Kenneth Fuller, Kenneth Fuller  MR#:  [id number] BIRTHDATE:  [birth year], 65 yrs. old  GENDER:  male ENDOSCOPIST:  Milus Banister, MD PROCEDURE DATE:  01/22/2011 PROCEDURE:  Colonoscopy with snare polypectomy ASA CLASS:  Class II INDICATIONS:  recent minor rectal bleeding, TA removed in 2008 MEDICATIONS:   Fentanyl 50 mcg IV, These medications were titrated to patient response per physician's verbal order, Versed 9 mg IV  DESCRIPTION OF PROCEDURE:   After the risks benefits and alternatives of the procedure were thoroughly explained, informed consent was obtained.  Digital rectal exam was performed and revealed no rectal masses.   The LB CF-[id number] endoscope was introduced through the anus and advanced to the cecum, which was identified by both the appendix and ileocecal valve, without limitations.  The quality of the prep was good..  The instrument was then slowly withdrawn as the colon was fully examined. <<PROCEDUREIMAGES>> FINDINGS:  A diminutive polyp was found in the sigmoid colon. This was removed with cold snare and sent to pathology (jar 1) (see image3).  Internal Hemorrhoids were found.  This was otherwise a normal examination of the colon (see image4, image2, and image1). Retroflexed views in the rectum revealed no abnormalities. COMPLICATIONS:  None  ENDOSCOPIC IMPRESSION: 1) Diminutive polyp in the sigmoid colon; removed and sent to pathology 2) Internal hemorrhoids 3) Otherwise normal examination  RECOMMENDATIONS: 1) Given your personal history of adenomatous (pre-cancerous) polyps, you will need a repeat colonoscopy in 5 years even if the polyp removed today is not pre-cancerous. 2) You will receive a letter within 1-2 weeks with the results of your biopsy as well as final recommendations. Please call my office if you have not received a letter after 3  weeks.  ______________________________ Milus Banister, MD  cc: Redge Gainer, MD  n. Lorrin Mais:   Milus Banister at 01/22/2011 03:37 PM  [name], [phone]

## 2011-01-22 NOTE — Progress Notes (Signed)
[employer] Dr. Ardis Hughs advised by Margie Ege RN that pt. Presented with temp. Of 100.3. Pt. Denies symptoms of any kind. No orders given,  Will proceed with colonoscopy.

## 2011-01-22 NOTE — Patient Instructions (Signed)
Please follow all discharge instructions given to you by the recovery room nurse. If you have any questions or problems after discharge please call [phone]. You will receive a phone call in the am to see how you are doing and answer any questions you may have. Thank you for choosing [facility] for your health care needs.

## 2011-01-25 ENCOUNTER — Telehealth: Payer: Self-pay | Admitting: *Deleted

## 2011-01-25 NOTE — Telephone Encounter (Signed)
Follow up Call- Patient questions:  Do you have a fever, pain , or abdominal swelling? no Pain Score  0 *  Have you tolerated food without any problems? yes  Have you been able to return to your normal activities? yes  Do you have any questions about your discharge instructions: Diet   no Medications  no Follow up visit  no  Do you have questions or concerns about your Care? no  Actions: * If pain score is 4 or above: No action needed, pain <4.  Pt states that he has had a fever but he thinks that it is from something else that he has going on.

## 2011-02-03 ENCOUNTER — Ambulatory Visit: Payer: Medicare Other | Admitting: Gastroenterology

## 2012-02-01 ENCOUNTER — Ambulatory Visit (INDEPENDENT_AMBULATORY_CARE_PROVIDER_SITE_OTHER): Payer: Medicare Other | Admitting: Gastroenterology

## 2012-02-01 ENCOUNTER — Encounter: Payer: Self-pay | Admitting: Gastroenterology

## 2012-02-01 ENCOUNTER — Other Ambulatory Visit (INDEPENDENT_AMBULATORY_CARE_PROVIDER_SITE_OTHER): Payer: Medicare Other

## 2012-02-01 VITALS — BP 102/64 | HR 84 | Ht 66.5 in | Wt 275.2 lb

## 2012-02-01 DIAGNOSIS — R109 Unspecified abdominal pain: Secondary | ICD-10-CM

## 2012-02-01 DIAGNOSIS — E119 Type 2 diabetes mellitus without complications: Secondary | ICD-10-CM

## 2012-02-01 LAB — COMPREHENSIVE METABOLIC PANEL
ALT: 30 U/L (ref 0–53)
AST: 22 U/L (ref 0–37)
Albumin: 4 g/dL (ref 3.5–5.2)
Alkaline Phosphatase: 52 U/L (ref 39–117)
BUN: 14 mg/dL (ref 6–23)
Calcium: 9.4 mg/dL (ref 8.4–10.5)
Chloride: 98 mEq/L (ref 96–112)
Potassium: 3.6 mEq/L (ref 3.5–5.1)
Sodium: 138 mEq/L (ref 135–145)
Total Protein: 7.4 g/dL (ref 6.0–8.3)

## 2012-02-01 LAB — CBC WITH DIFFERENTIAL/PLATELET
Basophils Relative: 0.6 % (ref 0.0–3.0)
Eosinophils Absolute: 0.2 10*3/uL (ref 0.0–0.7)
Lymphocytes Relative: 26.8 % (ref 12.0–46.0)
MCHC: 33.7 g/dL (ref 30.0–36.0)
MCV: 83.2 fl (ref 78.0–100.0)
Monocytes Absolute: 0.6 10*3/uL (ref 0.1–1.0)
Neutrophils Relative %: 64 % (ref 43.0–77.0)
Platelets: 287 10*3/uL (ref 150.0–400.0)
RBC: 5.36 Mil/uL (ref 4.22–5.81)
WBC: 9.7 10*3/uL (ref 4.5–10.5)

## 2012-02-01 NOTE — Patient Instructions (Addendum)
You will be set up for a CT scan of abdomen and pelvis with IV and oral contrast for abd pains.  You have been scheduled for a CT scan of the abdomen and pelvis at [facility] ([address]---this is in the same building as Press [facility]).   You are scheduled on 122/26/13 at 130 pm. You should arrive 15 minutes prior to your appointment time for registration. Please follow the written instructions below on the day of your exam:  WARNING: IF YOU ARE ALLERGIC TO IODINE/X-RAY DYE, PLEASE NOTIFY RADIOLOGY IMMEDIATELY AT [phone]! YOU WILL BE GIVEN A 13 HOUR PREMEDICATION PREP.  1) Do not eat or drink anything after 930 am (4 hours prior to your test) 2) You have been given 2 bottles of oral contrast to drink. The solution may taste better if refrigerated, but do NOT add ice or any other liquid to this solution. Shake  well before drinking.    Drink 1 bottle of contrast @ 1130 am (2 hours prior to your exam)  Drink 1 bottle of contrast @ 1230 pm  (1 hour prior to your exam)  You may take any medications as prescribed with a small amount of water except for the following: Metformin, Glucophage, Glucovance, Avandamet, Riomet, Fortamet, Actoplus Met, Janumet, Glumetza or Metaglip. The above medications must be held the day of the exam AND 48 hours after the exam.  The purpose of you drinking the oral contrast is to aid in the visualization of your intestinal tract. The contrast solution may cause some diarrhea. Before your exam is started, you will be given a small amount of fluid to drink. Depending on your individual set of symptoms, you may also receive an intravenous injection of x-ray contrast/dye. Plan on being at [facility] for 30 minutes or long, depending on the type of exam you are having performed.  If you have any questions regarding your exam or if you need to reschedule, you may call the CT department at [phone] between the hours of 8:00 am and 5:00 pm,  Monday-Friday.  ________________________________________________________________________  Start a single imodium (OTC) every morning shortly after waking. You will have labs checked today in the basement lab.  Please head down after you check out with the front desk  (cbc, cmet, esr, tsh, celiac panel). We will get records from PCP office, recent labs. Depending on workup above, you may need upper endoscopy.

## 2012-02-01 NOTE — Progress Notes (Signed)
Review of pertinent gastrointestinal problems: 1. Adenomatous polyp 2008, small; repeat colonoscopy 01/2011 for minor rectal bleeding found small HP, hemorrhoids; was told to have recall in 5 years. 2. EGD normal 2008 for chest discomfort.  HPI: This is a very pleasant        pleasant 66 year old man who is here with his wife today. I last saw him about a year ago.   Was sent by Josie Saunders.  4-5 months of loose stools, alternating with diarrhea.  Sometimes when he eats he will have brisk gastrocolic reflex.  Can have nausea.  Also can have left sided abdominal pains.  He tried 2-3 meds by PCP but he does not recall any of the names of those medicines.  We don't have any of the records from his primary care office regarding trials of medicines, lab test results  He has been on glucophage 500 twice daily.  Has tried pepto to "cool his stomach."  Has some pain with swallowing.  Calls it a sore throat in his belly.    Sometimes has abdominal pains after eating.  He has had recent lab tests by PCP (stool testing, and others).  He has lost a few pounds.    Past Medical History  Diagnosis Date  . MRSA cellulitis   . Diabetes mellitus   . Hypogonadism male   . Obesity   . Hypertension   . Hyperlipidemia   . GI bleeding   . Fibromyalgia   . Sleep apnea     Past Surgical History  Procedure Date  . Neck fusion   . Back surgery   . Colonoscopy     Current Outpatient Prescriptions  Medication Sig Dispense Refill  . amLODipine (NORVASC) 10 MG tablet Take 5 mg by mouth daily.        [name] aspirin 81 MG tablet Take 81 mg by mouth daily.        . diazepam (VALIUM) 5 MG tablet Take 5 mg by mouth every 8 (eight) hours as needed.        . furosemide (LASIX) 40 MG tablet Take 80 mg by mouth daily.        . hydrochlorothiazide 25 MG tablet Take 25 mg by mouth daily.        . metFORMIN (GLUCOPHAGE) 500 MG tablet Take 500 mg by mouth daily.        . metoprolol (LOPRESSOR) 50 MG tablet  Take 50 mg by mouth 2 (two) times daily.        [name] omeprazole (PRILOSEC) 40 MG capsule Take 40 mg by mouth daily.        . potassium chloride (KLOR-CON) 10 MEQ CR tablet Take 10 mEq by mouth 2 (two) times daily.        . pravastatin (PRAVACHOL) 40 MG tablet Take 40 mg by mouth daily.          Allergies as of 02/01/2012 - Review Complete 02/01/2012  Allergen Reaction Noted  . Amlodipine besy-benazepril hcl  06/24/2010    Family History  Problem Relation Age of Onset  . Colon cancer Mother   . Diabetes Father     siblings  . Heart disease Father     brother  . Kidney disease Sister     History   Social History  . Marital Status: Married    Spouse Name: N/A    Number of Children: 3  . Years of Education: N/A   Occupational History  . retired    Science writer  History Main Topics  . Smoking status: Former Research scientist (life sciences)  . Smokeless tobacco: Never Used  . Alcohol Use: No  . Drug Use: No  . Sexually Active: Not on file   Other Topics Concern  . Not on file   Social History Narrative  . No narrative on file      Physical Exam: BP 102/64  Pulse 84  Ht 5' 6.5" (1.689 m)  Wt 275 lb 4 oz (124.853 kg)  BMI 43.76 kg/m2 Constitutional: generally well-appearing Psychiatric: alert and oriented x3 Abdomen: soft, very mildly tender left lower quadrant, nondistended, no obvious ascites, no peritoneal signs, normal bowel sounds     Assessment and plan: 66 y.o. male with upper and lower GI symptoms  The most significant is his abdominal pain in the left sided abdomen like to pursue imaging to work that up as well as basic set of blood work including the labs listed below. He is going to start taking a single Imodium pill every day. We will request records from his primary care office. Depending on the results of the workup he might need EGD as well.

## 2012-02-03 ENCOUNTER — Other Ambulatory Visit: Payer: Medicare Other

## 2012-02-03 LAB — CELIAC PANEL 10
Gliadin IgA: 3 U/mL (ref <20)
Gliadin IgG: 3.7 U/mL (ref <20)
Tissue Transglut Ab: 4.1 U/mL (ref <20)
Tissue Transglutaminase Ab, IgA: 5.8 U/mL (ref <20)

## 2012-02-04 ENCOUNTER — Ambulatory Visit (INDEPENDENT_AMBULATORY_CARE_PROVIDER_SITE_OTHER)
Admission: RE | Admit: 2012-02-04 | Discharge: 2012-02-04 | Disposition: A | Discharge status: Home / Self Care | Payer: Medicare Other | Source: Ambulatory Visit | Attending: Gastroenterology | Admitting: Gastroenterology

## 2012-02-04 DIAGNOSIS — R109 Unspecified abdominal pain: Secondary | ICD-10-CM

## 2012-02-04 MED ORDER — IOHEXOL 300 MG/ML  SOLN
100.0000 mL | Freq: Once | INTRAMUSCULAR | Status: AC | PRN
  Administered 2012-02-04: 100 mL via INTRAVENOUS

## 2012-02-07 ENCOUNTER — Other Ambulatory Visit: Payer: Self-pay

## 2012-02-07 DIAGNOSIS — R1011 Right upper quadrant pain: Secondary | ICD-10-CM

## 2012-02-07 NOTE — Progress Notes (Signed)
[facility] [place] 8 am You have been scheduled for an abdominal ultrasound at [facility] (1st floor of hospital) on 02/08/12 at 8 am. Please arrive 15 minutes prior to your appointment for registration. Make certain not to have anything to eat or drink after midnight. Should you need to reschedule your appointment, please contact radiology at [phone]. This test typically takes about 30 minutes to perform.  Pt aware

## 2012-02-08 ENCOUNTER — Ambulatory Visit (HOSPITAL_COMMUNITY)
Admission: RE | Admit: 2012-02-08 | Discharge: 2012-02-08 | Disposition: A | Discharge status: Home / Self Care | Payer: Medicare Other | Source: Ambulatory Visit | Attending: Gastroenterology | Admitting: Gastroenterology

## 2012-02-08 DIAGNOSIS — R1011 Right upper quadrant pain: Secondary | ICD-10-CM

## 2012-02-11 ENCOUNTER — Telehealth: Payer: Self-pay | Admitting: Gastroenterology

## 2012-02-11 NOTE — Telephone Encounter (Signed)
Pt aware that the results have not been reviewed and as soon as Dr Ardis Hughs reviews I will call with recommendations

## 2012-02-16 ENCOUNTER — Ambulatory Visit (AMBULATORY_SURGERY_CENTER): Payer: Medicare Other

## 2012-02-16 VITALS — Ht 66.5 in | Wt 277.8 lb

## 2012-02-16 DIAGNOSIS — R109 Unspecified abdominal pain: Secondary | ICD-10-CM

## 2012-02-25 ENCOUNTER — Ambulatory Visit (AMBULATORY_SURGERY_CENTER): Payer: Medicare Other | Admitting: Gastroenterology

## 2012-02-25 ENCOUNTER — Encounter: Payer: Self-pay | Admitting: Gastroenterology

## 2012-02-25 VITALS — BP 123/80 | HR 81 | Temp 98.3°F | Resp 27 | Ht 66.5 in | Wt 277.0 lb

## 2012-02-25 DIAGNOSIS — K2970 Gastritis, unspecified, without bleeding: Secondary | ICD-10-CM

## 2012-02-25 DIAGNOSIS — K2990 Gastroduodenitis, unspecified, without bleeding: Secondary | ICD-10-CM

## 2012-02-25 DIAGNOSIS — R109 Unspecified abdominal pain: Secondary | ICD-10-CM

## 2012-02-25 LAB — GLUCOSE, CAPILLARY
Glucose-Capillary: 166 mg/dL — ABNORMAL HIGH (ref 70–99)
Glucose-Capillary: 134 mg/dL — ABNORMAL HIGH (ref 70–99)

## 2012-02-25 MED ORDER — SODIUM CHLORIDE 0.9 % IV SOLN: 500.0000 mL | INTRAVENOUS | Status: DC

## 2012-02-25 NOTE — Op Note (Signed)
[facility]  [address] [place], [id number]   ENDOSCOPY PROCEDURE REPORT  PATIENT: Kenneth Fuller, Kenneth Fuller  MR#: [id number] BIRTHDATE: November 11, 1945 , 66  yrs. old GENDER: Male ENDOSCOPIST: Milus Banister, MD PROCEDURE DATE:  02/25/2012 PROCEDURE:  EGD w/ biopsy ASA CLASS:     Class III INDICATIONS:  abdominal pain; unrevealing CT, [place], CBC, CMET. MEDICATIONS: Fentanyl 50 mcg IV, Versed 6 mg IV, and These medications were titrated to patient response per physician's verbal order TOPICAL ANESTHETIC: Cetacaine Spray  DESCRIPTION OF PROCEDURE: After the risks benefits and alternatives of the procedure were thoroughly explained, informed consent was obtained.  The [facility] GIF-H180 [id number] endoscope was introduced through the mouth and advanced to the second portion of the duodenum. Without limitations.  The instrument was slowly withdrawn as the mucosa was fully examined.    There was moderate, non-specific gastritis in distal stomach.  This was biopsied and sent to pathology.  The examination was otherwise normal.  Retroflexed views revealed no abnormalities.     The scope was then withdrawn from the patient and the procedure completed. COMPLICATIONS: There were no complications.  ENDOSCOPIC IMPRESSION: There was moderate, non-specific gastritis in distal stomach, biopsied to check for H. pylori The examination was otherwise normal.  RECOMMENDATIONS: Await biopsy results    eSigned:  Milus Banister, MD 02/25/2012 3:08 PM

## 2012-02-25 NOTE — Progress Notes (Signed)
The pt tolerated the egd well. Maw

## 2012-02-25 NOTE — Patient Instructions (Addendum)
YOU HAD AN ENDOSCOPIC PROCEDURE TODAY AT [facility]: Refer to the procedure report that was given to you for any specific questions about what was found during the examination.  If the procedure report does not answer your questions, please call your gastroenterologist to clarify.  If you requested that your care partner not be given the details of your procedure findings, then the procedure report has been included in a sealed envelope for you to review at your convenience later.  YOU SHOULD EXPECT: Some feelings of bloating in the abdomen. Passage of more gas than usual.  Walking can help get rid of the air that was put into your GI tract during the procedure and reduce the bloating. If you had a lower endoscopy (such as a colonoscopy or flexible sigmoidoscopy) you may notice spotting of blood in your stool or on the toilet paper. If you underwent a bowel prep for your procedure, then you may not have a normal bowel movement for a few days.  DIET: Your first meal following the procedure should be a light meal and then it is ok to progress to your normal diet.  A half-sandwich or bowl of soup is an example of a good first meal.  Heavy or fried foods are harder to digest and may make you feel nauseous or bloated.  Likewise meals heavy in dairy and vegetables can cause extra gas to form and this can also increase the bloating.  Drink plenty of fluids but you should avoid alcoholic beverages for 24 hours.  ACTIVITY: Your care partner should take you home directly after the procedure.  You should plan to take it easy, moving slowly for the rest of the day.  You can resume normal activity the day after the procedure however you should NOT DRIVE or use heavy machinery for 24 hours (because of the sedation medicines used during the test).    SYMPTOMS TO REPORT IMMEDIATELY: A gastroenterologist can be reached at any hour.  During normal business hours, 8:30 AM to 5:00 PM Monday through Friday,  call [phone].  After hours and on weekends, please call the GI answering service at [phone] who will take a message and have the physician on call contact you.     Following upper endoscopy (EGD)  Vomiting of blood or coffee ground material  New chest pain or pain under the shoulder blades  Painful or persistently difficult swallowing  New shortness of breath  Fever of 100F or higher  Black, tarry-looking stools  FOLLOW UP: If any biopsies were taken you will be contacted by phone or by letter within the next 1-3 weeks.  Call your gastroenterologist if you have not heard about the biopsies in 3 weeks.  Our staff will call the home number listed on your records the next business day following your procedure to check on you and address any questions or concerns that you may have at that time regarding the information given to you following your procedure. This is a courtesy call and so if there is no answer at the home number and we have not heard from you through the emergency physician on call, we will assume that you have returned to your regular daily activities without incident.  SIGNATURES/CONFIDENTIALITY: You and/or your care partner have signed paperwork which will be entered into your electronic medical record.  These signatures attest to the fact that that the information above on your After Visit Summary has been reviewed and is understood.  Full responsibility of the confidentiality of this discharge information lies with you and/or your care-partner.    Information on gastritis given to you today  Await biopsy results

## 2012-02-25 NOTE — Progress Notes (Signed)
Patient did not experience any of the following events: a burn prior to discharge; a fall within the facility; wrong site/side/patient/procedure/implant event; or a hospital transfer or hospital admission upon discharge from the facility. (G8907) Patient did not have preoperative order for IV antibiotic SSI prophylaxis. (G8918)  

## 2012-02-28 ENCOUNTER — Telehealth: Payer: Self-pay | Admitting: *Deleted

## 2012-02-28 NOTE — Telephone Encounter (Signed)
  Follow up Call-  Call back number 02/25/2012 01/22/2011  Post procedure Call Back phone  # [phone] message OK  Permission to leave phone message Yes -     Patient questions:  Do you have a fever, pain , or abdominal swelling? no Pain Score  0 *  Have you tolerated food without any problems? yes  Have you been able to return to your normal activities? yes  Do you have any questions about your discharge instructions: Diet   no Medications  no Follow up visit  no  Do you have questions or concerns about your Care? no  Actions: * If pain score is 4 or above: No action needed, pain <4. Pt having same type of pain he was having prior to procedure which is reason we did procedure but no new discomfort or problems from procedure

## 2012-03-03 ENCOUNTER — Telehealth: Payer: Self-pay | Admitting: Gastroenterology

## 2012-03-06 ENCOUNTER — Encounter: Payer: Self-pay | Admitting: Gastroenterology

## 2012-03-06 ENCOUNTER — Telehealth: Payer: Self-pay | Admitting: Gastroenterology

## 2012-03-06 NOTE — Telephone Encounter (Signed)
Labs 12/2011: stool tests all neg, including FOB; cbc normal, h. Pylori abx negative, amylase normal, cmet normal

## 2012-03-06 NOTE — Telephone Encounter (Signed)
Pt states his diarrhea has gotten better and will continue the imodium daily and call if the diarrhea worsens or he develops any further symptoms

## 2012-05-05 ENCOUNTER — Other Ambulatory Visit: Payer: Self-pay | Admitting: Nurse Practitioner

## 2012-05-05 ENCOUNTER — Telehealth: Payer: Self-pay | Admitting: Nurse Practitioner

## 2012-05-05 MED ORDER — AMOXICILLIN 875 MG PO TABS: 875.0000 mg | ORAL_TABLET | Freq: Two times a day (BID) | ORAL | Status: DC

## 2012-05-05 MED ORDER — HYDROCODONE-HOMATROPINE 5-1.5 MG/5ML PO SYRP: 5.0000 mL | ORAL_SOLUTION | Freq: Three times a day (TID) | ORAL | Status: DC | PRN

## 2012-05-05 NOTE — Telephone Encounter (Signed)
Please advise coughing and cant breath well

## 2012-05-05 NOTE — Telephone Encounter (Signed)
What symptoms is he having

## 2012-05-05 NOTE — Progress Notes (Signed)
Patient aware

## 2012-05-05 NOTE — Telephone Encounter (Signed)
Cant breath with out coughing. Chest congestion

## 2012-06-12 ENCOUNTER — Ambulatory Visit (INDEPENDENT_AMBULATORY_CARE_PROVIDER_SITE_OTHER): Payer: Medicare Other | Admitting: Nurse Practitioner

## 2012-06-12 ENCOUNTER — Encounter: Payer: Self-pay | Admitting: Nurse Practitioner

## 2012-06-12 VITALS — BP 101/77 | HR 74 | Temp 98.0°F | Ht 67.0 in | Wt 266.5 lb

## 2012-06-12 DIAGNOSIS — E785 Hyperlipidemia, unspecified: Secondary | ICD-10-CM

## 2012-06-12 DIAGNOSIS — I10 Essential (primary) hypertension: Secondary | ICD-10-CM

## 2012-06-12 DIAGNOSIS — E119 Type 2 diabetes mellitus without complications: Secondary | ICD-10-CM

## 2012-06-12 MED ORDER — METOPROLOL TARTRATE 50 MG PO TABS: ORAL_TABLET | ORAL | Status: DC

## 2012-06-12 NOTE — Progress Notes (Signed)
Subjective:    Patient ID: Kenneth Fuller, male    DOB: May 18, 1945, 67 y.o.   MRN: [id number]  Hypertension This is a chronic problem. The current episode started more than 1 year ago. The problem has been waxing and waning since onset. The problem is controlled. Associated symptoms include anxiety and shortness of breath. Pertinent negatives include no blurred vision, chest pain, headaches, orthopnea, palpitations, peripheral edema or sweats. There are no associated agents to hypertension. Risk factors for coronary artery disease include obesity, male gender, dyslipidemia and diabetes mellitus. Past treatments include beta blockers, calcium channel blockers and diuretics. The current treatment provides significant improvement. Compliance problems include exercise and diet.   Hyperlipidemia This is a chronic problem. The current episode started more than 1 year ago. The problem is uncontrolled. Recent lipid tests were reviewed and are high. Exacerbating diseases include obesity. He has no history of hypothyroidism or liver disease. Factors aggravating his hyperlipidemia include thiazides. Associated symptoms include shortness of breath. Pertinent negatives include no chest pain or myalgias. Current antihyperlipidemic treatment includes statins. The current treatment provides significant improvement of lipids. Compliance problems include adherence to diet and adherence to exercise.  Risk factors for coronary artery disease include hypertension and diabetes mellitus.  Anxiety Presents for follow-up visit. Symptoms include nausea and shortness of breath. Patient reports no chest pain, confusion, decreased concentration, dizziness, dry mouth, feeling of choking, impotence, insomnia, irritability or palpitations. Symptoms occur occasionally. The severity of symptoms is mild. The quality of sleep is fair. Nighttime awakenings: none.   His past medical history is significant for asthma.  Asthma He  complains of shortness of breath. There is no cough, hoarse voice, sputum production or wheezing. This is a chronic problem. The current episode started more than 1 year ago. The problem occurs intermittently. The problem has been waxing and waning. Pertinent negatives include no chest pain, headaches, myalgias, sweats or weight loss. His symptoms are aggravated by nothing. His past medical history is significant for asthma.  Diabetes He presents for his follow-up diabetic visit. He has type 2 diabetes mellitus. No MedicAlert identification noted. The initial diagnosis of diabetes was made 4 years ago. His disease course has been stable. Pertinent negatives for hypoglycemia include no confusion, dizziness, headaches or sweats. Pertinent negatives for diabetes include no blurred vision, no chest pain, no polydipsia, no polyphagia, no polyuria, no weakness and no weight loss. There are no hypoglycemic complications. Symptoms are stable. There are no diabetic complications. Pertinent negatives for diabetic complications include no impotence. Risk factors for coronary artery disease include dyslipidemia, male sex, obesity and hypertension. Current diabetic treatment includes diet and oral agent (monotherapy). He is compliant with treatment most of the time. His weight is stable. When asked about meal planning, he reported none. He has not had a previous visit with a dietician. He rarely participates in exercise. There is no change in his home blood glucose trend. His breakfast blood glucose is taken between 8-9 am. His breakfast blood glucose range is generally 110-130 mg/dl. His overall blood glucose range is 110-130 mg/dl. An ACE inhibitor/angiotensin II receptor blocker is being taken. He does not see a podiatrist.Eye exam is current (over a year ago).  Hypokalemia Occassional lower ext cramping. Gerd Controlled with omeprazole- No symptoms when takes meds. Peripheral edema Lasix 40 mg daily. Patient says if  he misses a pill then he starts to swell   Review of Systems  Constitutional: Negative for weight loss and irritability.  HENT: Negative for  hoarse voice.   Eyes: Negative for blurred vision.  Respiratory: Positive for shortness of breath. Negative for cough, sputum production and wheezing.   Cardiovascular: Negative for chest pain, palpitations and orthopnea.  Gastrointestinal: Positive for nausea.  Endocrine: Negative for polydipsia, polyphagia and polyuria.  Genitourinary: Negative for impotence.  Musculoskeletal: Negative for myalgias.  Neurological: Negative for dizziness, weakness and headaches.  Psychiatric/Behavioral: Negative for confusion and decreased concentration. The patient does not have insomnia.   All other systems reviewed and are negative.       Objective:   Physical Exam  Constitutional: He is oriented to person, place, and time. He appears well-developed and well-nourished.  HENT:  Head: Normocephalic.  Right Ear: External ear normal.  Left Ear: External ear normal.  Nose: Nose normal.  Mouth/Throat: Oropharynx is clear and moist.  Eyes: EOM are normal. Pupils are equal, round, and reactive to light.  Neck: Normal range of motion. Neck supple. No thyromegaly present.  Cardiovascular: Normal rate, regular rhythm, normal heart sounds and intact distal pulses.   No murmur heard. Pulmonary/Chest: Effort normal and breath sounds normal. He has no wheezes. He has no rales.  Abdominal: Soft. Bowel sounds are normal.  Genitourinary: Prostate normal and penis normal.  Musculoskeletal: Normal range of motion.  Neurological: He is alert and oriented to person, place, and time.  Positive 3/4 monofilament bil  Skin: Skin is warm and dry.  Callus formation bil heels  Psychiatric: He has a normal mood and affect. His behavior is normal. Judgment and thought content normal.    BP 101/77  Pulse 74  Temp(Src) 98 F (36.7 C) (Oral)  Ht _0  (1.702 m)  Wt 266 lb 8 oz  (120.884 kg)  BMI 41.73 kg/m2 Results for orders placed in visit on 06/12/12  POCT GLYCOSYLATED HEMOGLOBIN (HGB A1C)      Result Value Range   Hemoglobin A1C 6.6           Assessment & Plan:  1. DM Low Carb diet Continue glucophage as rx  - POCT glycosylated hemoglobin (Hb A1C) - COMPLETE METABOLIC PANEL WITH GFR - NMR Lipoprofile with Lipids  2. HYPERTENSION Low Na+ diet Continue norvasc, and lopressor as Rx StoP HCTZ - COMPLETE METABOLIC PANEL WITH GFR - NMR Lipoprofile with Lipids  3. Other and unspecified hyperlipidemia Low fat diet and exercise encouraged [place]in[place] Pravachol as Rx - COMPLETE METABOLIC PANEL WITH GFR  4. Hypokalemia Continue K dur as RX  5. Gerd Avoid spicy and fatty foods Do not eat 2 hrs prior to bedtime Continue omeprazole as rx  6. Peripheral edema Lasix as rx Elevate legs when sitting  Mary-Margaret Hassell Done, FNP  - NMR Lipoprofile with Lipids

## 2012-06-12 NOTE — Patient Instructions (Signed)

## 2012-06-13 ENCOUNTER — Other Ambulatory Visit: Payer: Self-pay

## 2012-06-13 LAB — COMPLETE METABOLIC PANEL WITH GFR
AST: 20 U/L (ref 0–37)
Albumin: 4.3 g/dL (ref 3.5–5.2)
Alkaline Phosphatase: 50 U/L (ref 39–117)
Potassium: 4.2 mEq/L (ref 3.5–5.3)
Sodium: 141 mEq/L (ref 135–145)
Total Protein: 6.9 g/dL (ref 6.0–8.3)

## 2012-06-13 MED ORDER — METOPROLOL TARTRATE 50 MG PO TABS: ORAL_TABLET | ORAL | Status: DC

## 2012-06-14 LAB — NMR LIPOPROFILE WITH LIPIDS
HDL Particle Number: 29.2 umol/L — ABNORMAL LOW (ref >=30.5)
HDL-C: 33 mg/dL — ABNORMAL LOW (ref >=40)
Large HDL-P: 1.4 umol/L — ABNORMAL LOW (ref >=4.8)
Triglycerides: 192 mg/dL — ABNORMAL HIGH (ref <150)

## 2012-06-21 NOTE — Telephone Encounter (Signed)
Amoxicillin and cough med called in per mmm. Pt was notified in another encounter per Waverly Ferrari CMA

## 2012-07-27 ENCOUNTER — Other Ambulatory Visit (HOSPITAL_COMMUNITY): Payer: Self-pay | Admitting: Cardiovascular Disease

## 2012-07-27 DIAGNOSIS — R0602 Shortness of breath: Secondary | ICD-10-CM

## 2012-08-14 ENCOUNTER — Ambulatory Visit (HOSPITAL_COMMUNITY)
Admission: RE | Admit: 2012-08-14 | Discharge: 2012-08-14 | Disposition: A | Discharge status: Home / Self Care | Payer: Medicare Other | Source: Ambulatory Visit | Attending: Cardiology | Admitting: Cardiology

## 2012-08-14 DIAGNOSIS — E119 Type 2 diabetes mellitus without complications: Secondary | ICD-10-CM | POA: Insufficient documentation

## 2012-08-14 DIAGNOSIS — R0989 Other specified symptoms and signs involving the circulatory and respiratory systems: Secondary | ICD-10-CM | POA: Insufficient documentation

## 2012-08-14 DIAGNOSIS — R0609 Other forms of dyspnea: Secondary | ICD-10-CM | POA: Insufficient documentation

## 2012-08-14 DIAGNOSIS — I10 Essential (primary) hypertension: Secondary | ICD-10-CM | POA: Insufficient documentation

## 2012-08-14 DIAGNOSIS — R0602 Shortness of breath: Secondary | ICD-10-CM

## 2012-08-14 DIAGNOSIS — I2510 Atherosclerotic heart disease of native coronary artery without angina pectoris: Secondary | ICD-10-CM | POA: Insufficient documentation

## 2012-08-14 DIAGNOSIS — E669 Obesity, unspecified: Secondary | ICD-10-CM | POA: Insufficient documentation

## 2012-08-14 NOTE — Progress Notes (Signed)
2D Echo Performed 08/14/2012    Marygrace Drought, RCS

## 2012-08-16 ENCOUNTER — Telehealth: Payer: Self-pay | Admitting: Nurse Practitioner

## 2012-08-18 ENCOUNTER — Telehealth: Payer: Self-pay | Admitting: Nurse Practitioner

## 2012-08-18 MED ORDER — PRODIGY BLOOD GLUCOSE MONITOR W/DEVICE KIT: 1.0000 [IU] | PACK | Freq: Every day | Status: DC

## 2012-08-18 MED ORDER — GLUCOSE BLOOD VI STRP: ORAL_STRIP | Status: DC

## 2012-08-18 NOTE — Telephone Encounter (Signed)
rx sent to pharmacy

## 2012-08-19 ENCOUNTER — Telehealth: Payer: Self-pay | Admitting: *Deleted

## 2012-08-19 MED ORDER — ONETOUCH DELICA LANCETS 33G MISC: 1.0000 | Freq: Every day | Status: DC

## 2012-08-19 NOTE — Telephone Encounter (Signed)
Left message that  rx sent to pharmacy

## 2012-08-19 NOTE — Telephone Encounter (Signed)
Needs rx for lancets one [facility]

## 2012-08-24 NOTE — Telephone Encounter (Signed)
No return call

## 2013-02-05 ENCOUNTER — Telehealth: Payer: Self-pay | Admitting: Nurse Practitioner

## 2013-02-05 NOTE — Telephone Encounter (Signed)
appt 12/30 with Kenneth Fuller

## 2013-02-06 ENCOUNTER — Ambulatory Visit: Payer: Medicare Other | Admitting: Family Medicine

## 2013-02-13 ENCOUNTER — Ambulatory Visit (INDEPENDENT_AMBULATORY_CARE_PROVIDER_SITE_OTHER): Payer: Medicare Other | Admitting: Nurse Practitioner

## 2013-02-13 ENCOUNTER — Encounter (INDEPENDENT_AMBULATORY_CARE_PROVIDER_SITE_OTHER): Payer: Self-pay

## 2013-02-13 VITALS — BP 140/84 | HR 81 | Temp 97.8°F | Ht 67.0 in | Wt 272.0 lb

## 2013-02-13 DIAGNOSIS — M545 Low back pain, unspecified: Secondary | ICD-10-CM

## 2013-02-13 DIAGNOSIS — R32 Unspecified urinary incontinence: Secondary | ICD-10-CM

## 2013-02-13 DIAGNOSIS — R300 Dysuria: Secondary | ICD-10-CM

## 2013-02-13 LAB — POCT URINALYSIS DIPSTICK
Bilirubin, UA: NEGATIVE
Blood, UA: NEGATIVE
Glucose, UA: NEGATIVE
KETONES UA: NEGATIVE
Leukocytes, UA: NEGATIVE
Nitrite, UA: NEGATIVE
PH UA: 6
PROTEIN UA: NEGATIVE
SPEC GRAV UA: 1.01
Urobilinogen, UA: NEGATIVE

## 2013-02-13 LAB — POCT UA - MICROSCOPIC ONLY
Bacteria, U Microscopic: NEGATIVE
CRYSTALS, UR, HPF, POC: NEGATIVE
Casts, Ur, LPF, POC: NEGATIVE
Mucus, UA: NEGATIVE
RBC, urine, microscopic: NEGATIVE
WBC, Ur, HPF, POC: NEGATIVE
YEAST UA: NEGATIVE

## 2013-02-13 MED ORDER — TRAMADOL HCL 50 MG PO TABS: 50.0000 mg | ORAL_TABLET | Freq: Three times a day (TID) | ORAL | Status: DC | PRN

## 2013-02-13 MED ORDER — METHYLPREDNISOLONE ACETATE 80 MG/ML IJ SUSP
80.0000 mg | Freq: Once | INTRAMUSCULAR | Status: AC
  Administered 2013-02-13: 80 mg via INTRAMUSCULAR

## 2013-02-13 NOTE — Patient Instructions (Signed)
Back Pain, Adult °Low back pain is very common. About 1 in 5 people have back pain. The cause of low back pain is rarely dangerous. The pain often gets better over time. About half of people with a sudden onset of back pain feel better in just 2 weeks. About 8 in 10 people feel better by 6 weeks.  °CAUSES °Some common causes of back pain include: °· Strain of the muscles or ligaments supporting the spine. °· Wear and tear (degeneration) of the spinal discs. °· Arthritis. °· Direct injury to the back. °DIAGNOSIS °Most of the time, the direct cause of low back pain is not known. However, back pain can be treated effectively even when the exact cause of the pain is unknown. Answering your caregiver's questions about your overall health and symptoms is one of the most accurate ways to make sure the cause of your pain is not dangerous. If your caregiver needs more information, he or she may order lab work or imaging tests (X-rays or MRIs). However, even if imaging tests show changes in your back, this usually does not require surgery. °HOME CARE INSTRUCTIONS °For many people, back pain returns. Since low back pain is rarely dangerous, it is often a condition that people can learn to manage on their own.  °· Remain active. It is stressful on the back to sit or stand in one place. Do not sit, drive, or stand in one place for more than 30 minutes at a time. Take short walks on level surfaces as soon as pain allows. Try to increase the length of time you walk each day. °· Do not stay in bed. Resting more than 1 or 2 days can delay your recovery. °· Do not avoid exercise or work. Your body is made to move. It is not dangerous to be active, even though your back may hurt. Your back will likely heal faster if you return to being active before your pain is gone. °· Pay attention to your body when you  bend and lift. Many people have less discomfort when lifting if they bend their knees, keep the load close to their bodies, and  avoid twisting. Often, the most comfortable positions are those that put less stress on your recovering back. °· Find a comfortable position to sleep. Use a firm mattress and lie on your side with your knees slightly bent. If you lie on your back, put a pillow under your knees. °· Only take over-the-counter or prescription medicines as directed by your caregiver. Over-the-counter medicines to reduce pain and inflammation are often the most helpful. Your caregiver may prescribe muscle relaxant drugs. These medicines help dull your pain so you can more quickly return to your normal activities and healthy exercise. °· Put ice on the injured area. °· Put ice in a plastic bag. °· Place a towel between your skin and the bag. °· Leave the ice on for 15-20 minutes, 03-04 times a day for the first 2 to 3 days. After that, ice and heat may be alternated to reduce pain and spasms. °· Ask your caregiver about trying back exercises and gentle massage. This may be of some benefit. °· Avoid feeling anxious or stressed. Stress increases muscle tension and can worsen back pain. It is important to recognize when you are anxious or stressed and learn ways to manage it. Exercise is a great option. °SEEK MEDICAL CARE IF: °· You have pain that is not relieved with rest or medicine. °· You have pain that does not improve in 1 week. °· You have new symptoms. °· You are generally not feeling well. °SEEK   IMMEDIATE MEDICAL CARE IF:   You have pain that radiates from your back into your legs.  You develop new bowel or bladder control problems.  You have unusual weakness or numbness in your arms or legs.  You develop nausea or vomiting.  You develop abdominal pain.  You feel faint. Document Released: 01/25/2005 Document Revised: 07/27/2011 Document Reviewed: 06/15/2010 [facility] Patient Information 2014 [place], [place].

## 2013-02-13 NOTE — Progress Notes (Signed)
Subjective:    Patient ID: Kenneth Fuller, male    DOB: [birth year], 68 y.o.   MRN: [id number]  HPI Patient in c/o lower back pain bilaterally- usually only on one side or the other. Says that he leaks urine after going to the bathroom when he thinks he is finished- Occurs daily.Has never seen a urologist- no trouble starting stream- strong stream- does have urgency when he needs to go.    Review of Systems  Constitutional: Negative.   Respiratory: Negative.   Cardiovascular: Negative.   Genitourinary: Positive for urgency and frequency. Negative for dysuria, hematuria, flank pain, decreased urine volume, discharge, penile swelling, scrotal swelling, enuresis, difficulty urinating, penile pain and testicular pain.  All other systems reviewed and are negative.       Objective:   Physical Exam  Constitutional: He appears well-developed and well-nourished.  Cardiovascular: Normal rate, regular rhythm and normal heart sounds.   Pulmonary/Chest: Effort normal and breath sounds normal.  Abdominal: Soft. Bowel sounds are normal. He exhibits no distension and no mass. There is no tenderness. There is no rebound and no guarding.  Genitourinary:  No CVA tenderness  Musculoskeletal: He exhibits no edema and no tenderness.  Low back pain on palpation biaterally- hx of DDD  Neurological: He has normal reflexes. He displays normal reflexes. He exhibits normal muscle tone.   BP 140/84  Pulse 81  Temp(Src) 97.8 F (36.6 C) (Oral)  Ht _0  (1.702 m)  Wt 272 lb (123.378 kg)  BMI 42.59 kg/m2 Results for orders placed in visit on 02/13/13  POCT URINALYSIS DIPSTICK      Result Value Range   Color, UA yellow     Clarity, UA clear     Glucose, UA neg     Bilirubin, UA neg     Ketones, UA neg     Spec Grav, UA 1.010     Blood, UA neg     pH, UA 6.0     Protein, UA neg     Urobilinogen, UA negative     Nitrite, UA neg     Leukocytes, UA Negative    POCT UA - MICROSCOPIC ONLY   Result Value Range   WBC, Ur, HPF, POC neg     RBC, urine, microscopic neg     Bacteria, U Microscopic neg     Mucus, UA neg     Epithelial cells, urine per micros occ     Crystals, Ur, HPF, POC neg     Casts, Ur, LPF, POC neg     Yeast, UA neg            Assessment & Plan:  1. Dysuria  - POCT urinalysis dipstick - POCT UA - Microscopic Only  2. Low back pain Follow up with specialist if not improving Moist heat Rest No heavy lifting - methylPREDNISolone acetate (DEPO-MEDROL) injection 80 mg; Inject 1 mL (80 mg total) into the muscle once. Meds ordered this encounter  Medications  . methylPREDNISolone acetate (DEPO-MEDROL) injection 80 mg    Sig:   . traMADol (ULTRAM) 50 MG tablet    Sig: Take 1 tablet (50 mg total) by mouth every 8 (eight) hours as needed.    Dispense:  30 tablet    Refill:  0    Order Specific Question:  Supervising Provider    Answer:  Chipper Herb [[id number]]    3. Urinary leakage Labs pending - Ambulatory referral to Urology - PSA, total and free  Mary-Margaret Hassell Done, FNP

## 2013-02-14 ENCOUNTER — Encounter: Payer: Self-pay | Admitting: Nurse Practitioner

## 2013-02-14 LAB — PSA, TOTAL AND FREE
PSA, Free Pct: 60 %
PSA, Free: 0.06 ng/mL
PSA: 0.1 ng/mL (ref 0.0–4.0)

## 2013-03-21 ENCOUNTER — Telehealth: Payer: Self-pay | Admitting: Nurse Practitioner

## 2013-03-21 MED ORDER — POTASSIUM CHLORIDE ER 10 MEQ PO TBCR: 10.0000 meq | EXTENDED_RELEASE_TABLET | Freq: Every day | ORAL | Status: DC

## 2013-03-21 NOTE — Telephone Encounter (Addendum)
No needs to stay on K- NTBS for routine follow up and labs

## 2013-03-23 ENCOUNTER — Telehealth: Payer: Self-pay | Admitting: Nurse Practitioner

## 2013-03-23 ENCOUNTER — Other Ambulatory Visit: Payer: Self-pay | Admitting: *Deleted

## 2013-03-23 NOTE — Telephone Encounter (Signed)
Does it have generic?

## 2013-03-23 NOTE — Telephone Encounter (Signed)
  Patient aware mmm will not be back in office till Monday to handle

## 2013-03-23 NOTE — Telephone Encounter (Addendum)
Last labs done on 06-12-12. Please advise for a 90 day supply. Also requesting a 90 day supply for HCTZ 66m 1 QD. Do not see on med list. Please advise

## 2013-03-25 MED ORDER — AMLODIPINE BESYLATE 10 MG PO TABS: 5.0000 mg | ORAL_TABLET | Freq: Every day | ORAL | Status: DC

## 2013-03-25 MED ORDER — POTASSIUM CHLORIDE ER 10 MEQ PO TBCR: 10.0000 meq | EXTENDED_RELEASE_TABLET | Freq: Every day | ORAL | Status: DC

## 2013-03-25 MED ORDER — OMEPRAZOLE 40 MG PO CPDR
40.0000 mg | DELAYED_RELEASE_CAPSULE | Freq: Every day | ORAL | Status: DC
Start: ? — End: 2014-03-06

## 2013-03-25 MED ORDER — PRAVASTATIN SODIUM 40 MG PO TABS
40.0000 mg | ORAL_TABLET | Freq: Every day | ORAL | Status: DC
Start: ? — End: 2013-08-28

## 2013-03-25 MED ORDER — METFORMIN HCL 500 MG PO TABS: 500.0000 mg | ORAL_TABLET | Freq: Two times a day (BID) | ORAL | Status: DC

## 2013-03-25 MED ORDER — METOPROLOL TARTRATE 50 MG PO TABS: ORAL_TABLET | ORAL | Status: DC

## 2013-03-25 MED ORDER — FUROSEMIDE 40 MG PO TABS: 40.0000 mg | ORAL_TABLET | Freq: Two times a day (BID) | ORAL | Status: DC

## 2013-03-29 ENCOUNTER — Other Ambulatory Visit: Payer: Self-pay

## 2013-03-29 NOTE — Telephone Encounter (Signed)
een 02/13/13  MMM this med not on EPIC list  Came over from mail order

## 2013-03-29 NOTE — Telephone Encounter (Signed)
Patient should not need this if he is taking lasix.

## 2013-05-01 ENCOUNTER — Telehealth: Payer: Self-pay | Admitting: Nurse Practitioner

## 2013-05-01 MED ORDER — METFORMIN HCL 1000 MG PO TABS: 1000.0000 mg | ORAL_TABLET | Freq: Two times a day (BID) | ORAL | Status: DC

## 2013-05-01 NOTE — Telephone Encounter (Signed)
rx sent to pharmacy

## 2013-05-02 NOTE — Telephone Encounter (Signed)
Pt aware this was fixed

## 2013-06-15 ENCOUNTER — Other Ambulatory Visit: Payer: Self-pay

## 2013-06-15 MED ORDER — FUROSEMIDE 40 MG PO TABS: 40.0000 mg | ORAL_TABLET | Freq: Two times a day (BID) | ORAL | Status: DC

## 2013-06-15 MED ORDER — METOPROLOL TARTRATE 50 MG PO TABS: ORAL_TABLET | ORAL | Status: DC

## 2013-06-15 NOTE — Telephone Encounter (Signed)
Last seen 02/13/13  MMM Last lipid 06/12/12  This is 90 day supply mail order

## 2013-06-15 NOTE — Telephone Encounter (Signed)
Patient NTBS for follow up and lab work Will not fill cholesterol meds until has labs-

## 2013-06-21 ENCOUNTER — Other Ambulatory Visit: Payer: Self-pay

## 2013-06-21 NOTE — Telephone Encounter (Signed)
Last seen 02/13/13 MMM  Last lipids 06/12/12

## 2013-06-21 NOTE — Telephone Encounter (Signed)
Patient NTBS for follow up and lab work for pravachol refill

## 2013-07-04 ENCOUNTER — Telehealth: Payer: Self-pay | Admitting: Nurse Practitioner

## 2013-07-04 MED ORDER — POTASSIUM CHLORIDE ER 10 MEQ PO TBCR: 10.0000 meq | EXTENDED_RELEASE_TABLET | Freq: Every day | ORAL | Status: DC

## 2013-07-04 MED ORDER — METOPROLOL TARTRATE 50 MG PO TABS: ORAL_TABLET | ORAL | Status: DC

## 2013-07-04 NOTE — Telephone Encounter (Signed)
Patient needs K+ to [facility] also

## 2013-07-04 NOTE — Telephone Encounter (Signed)
Patient aware appointment made

## 2013-07-04 NOTE — Telephone Encounter (Signed)
hasnt had regular labs done since 06/2012

## 2013-07-04 NOTE — Telephone Encounter (Signed)
sent metoprlol and potassium rx- did not do pravachol because NTBS for labs.

## 2013-07-09 ENCOUNTER — Ambulatory Visit (INDEPENDENT_AMBULATORY_CARE_PROVIDER_SITE_OTHER): Payer: Medicare Other

## 2013-07-09 ENCOUNTER — Encounter: Payer: Self-pay | Admitting: Nurse Practitioner

## 2013-07-09 ENCOUNTER — Ambulatory Visit (INDEPENDENT_AMBULATORY_CARE_PROVIDER_SITE_OTHER): Payer: Medicare Other | Admitting: Nurse Practitioner

## 2013-07-09 VITALS — BP 156/100 | HR 63 | Temp 98.3°F | Ht 67.0 in | Wt 273.0 lb

## 2013-07-09 DIAGNOSIS — R0609 Other forms of dyspnea: Secondary | ICD-10-CM

## 2013-07-09 DIAGNOSIS — I10 Essential (primary) hypertension: Secondary | ICD-10-CM

## 2013-07-09 DIAGNOSIS — M109 Gout, unspecified: Secondary | ICD-10-CM

## 2013-07-09 DIAGNOSIS — J45909 Unspecified asthma, uncomplicated: Secondary | ICD-10-CM

## 2013-07-09 DIAGNOSIS — R0600 Dyspnea, unspecified: Secondary | ICD-10-CM

## 2013-07-09 DIAGNOSIS — R0989 Other specified symptoms and signs involving the circulatory and respiratory systems: Secondary | ICD-10-CM

## 2013-07-09 DIAGNOSIS — E119 Type 2 diabetes mellitus without complications: Secondary | ICD-10-CM

## 2013-07-09 DIAGNOSIS — Z125 Encounter for screening for malignant neoplasm of prostate: Secondary | ICD-10-CM

## 2013-07-09 DIAGNOSIS — F411 Generalized anxiety disorder: Secondary | ICD-10-CM

## 2013-07-09 LAB — POCT GLYCOSYLATED HEMOGLOBIN (HGB A1C): Hemoglobin A1C: 6.4

## 2013-07-09 LAB — POCT UA - MICROALBUMIN: Microalbumin Ur, POC: 20 mg/L

## 2013-07-09 MED ORDER — FLUTICASONE-SALMETEROL 100-50 MCG/DOSE IN AEPB: 1.0000 | INHALATION_SPRAY | Freq: Two times a day (BID) | RESPIRATORY_TRACT | Status: DC

## 2013-07-09 NOTE — Patient Instructions (Signed)

## 2013-07-09 NOTE — Progress Notes (Signed)
Subjective:    Patient ID: Kenneth Fuller, male    DOB: 18-Jun-1945, 68 y.o.   MRN: [id number]  Hypertension This is a chronic problem. The current episode started more than 1 year ago. The problem has been waxing and waning since onset. The problem is controlled. Associated symptoms include anxiety and shortness of breath. Pertinent negatives include no blurred vision, chest pain, headaches, orthopnea, palpitations, peripheral edema or sweats. There are no associated agents to hypertension. Risk factors for coronary artery disease include obesity, male gender, dyslipidemia and diabetes mellitus. Past treatments include beta blockers, calcium channel blockers and diuretics. The current treatment provides significant improvement. Compliance problems include exercise and diet.   Hyperlipidemia This is a chronic problem. The current episode started more than 1 year ago. The problem is uncontrolled. Recent lipid tests were reviewed and are high. Exacerbating diseases include obesity. He has no history of hypothyroidism or liver disease. Factors aggravating his hyperlipidemia include thiazides. Associated symptoms include shortness of breath. Pertinent negatives include no chest pain or myalgias. Current antihyperlipidemic treatment includes statins. The current treatment provides significant improvement of lipids. Compliance problems include adherence to diet and adherence to exercise.  Risk factors for coronary artery disease include hypertension and diabetes mellitus.  Anxiety Presents for follow-up visit. Symptoms include nausea and shortness of breath. Patient reports no chest pain, confusion, decreased concentration, dizziness, dry mouth, feeling of choking, impotence, insomnia, irritability or palpitations. Symptoms occur occasionally. The severity of symptoms is mild. The quality of sleep is fair. Nighttime awakenings: none.   His past medical history is significant for asthma.  Asthma He  complains of shortness of breath. There is no cough, hoarse voice, sputum production or wheezing. This is a chronic problem. The current episode started more than 1 year ago. The problem occurs intermittently. The problem has been waxing and waning. Pertinent negatives include no chest pain, headaches, myalgias, sweats or weight loss. His symptoms are aggravated by nothing. His past medical history is significant for asthma.  Diabetes He presents for his follow-up diabetic visit. He has type 2 diabetes mellitus. No MedicAlert identification noted. The initial diagnosis of diabetes was made 4 years ago. His disease course has been stable. Pertinent negatives for hypoglycemia include no confusion, dizziness, headaches or sweats. Pertinent negatives for diabetes include no blurred vision, no chest pain, no polydipsia, no polyphagia, no polyuria, no weakness and no weight loss. There are no hypoglycemic complications. Symptoms are stable. There are no diabetic complications. Pertinent negatives for diabetic complications include no impotence. Risk factors for coronary artery disease include dyslipidemia, male sex, obesity and hypertension. Current diabetic treatment includes diet and oral agent (monotherapy). He is compliant with treatment most of the time. His weight is stable. When asked about meal planning, he reported none. He has not had a previous visit with a dietician. He rarely participates in exercise. There is no change in his home blood glucose trend. His breakfast blood glucose is taken between 8-9 am. His breakfast blood glucose range is generally 110-130 mg/dl. His overall blood glucose range is 110-130 mg/dl. An ACE inhibitor/angiotensin II receptor blocker is being taken. He does not see a podiatrist.Eye exam is current (over a year ago).  Hypokalemia Occassional lower ext cramping. Gerd Controlled with omeprazole- No symptoms when takes meds. Peripheral edema Lasix 40 mg daily. Patient says if  he misses a pill then he starts to swell  * patient says that he is experencing SOB- seems to notice it at night. He said  he felt that he could not breath in or out He had to get up at times to catch his breath.Ony really happened one night but lasted about 30 min and now he s afraid to go to sleep at night. Stopped smoking in 1971. No cough to speak of. No night sweats. Does have an occasional episodes of dyspnea during the day. Patient does have sleep apnea and is suppose to sleep with CPAPand has not been using it.   Review of Systems  Constitutional: Negative for weight loss and irritability.  HENT: Negative for hoarse voice.   Eyes: Negative for blurred vision.  Respiratory: Positive for shortness of breath. Negative for cough, sputum production and wheezing.   Cardiovascular: Negative for chest pain, palpitations and orthopnea.  Gastrointestinal: Positive for nausea.  Endocrine: Negative for polydipsia, polyphagia and polyuria.  Genitourinary: Negative for impotence.  Musculoskeletal: Negative for myalgias.  Neurological: Negative for dizziness, weakness and headaches.  Psychiatric/Behavioral: Negative for confusion and decreased concentration. The patient does not have insomnia.   All other systems reviewed and are negative.      Objective:   Physical Exam  Constitutional: He is oriented to person, place, and time. He appears well-developed and well-nourished.  HENT:  Head: Normocephalic.  Right Ear: External ear normal.  Left Ear: External ear normal.  Nose: Nose normal.  Mouth/Throat: Oropharynx is clear and moist.  Eyes: EOM are normal. Pupils are equal, round, and reactive to light.  Neck: Normal range of motion. Neck supple. No thyromegaly present.  Cardiovascular: Normal rate, regular rhythm, normal heart sounds and intact distal pulses.   No murmur heard. Pulmonary/Chest: Effort normal and breath sounds normal. He has no wheezes. He has no rales.  Abdominal: Soft.  Bowel sounds are normal.  Musculoskeletal: Normal range of motion.  Neurological: He is alert and oriented to person, place, and time.  Skin: Skin is warm and dry.  Callus formation bil heels  Psychiatric: He has a normal mood and affect. His behavior is normal. Judgment and thought content normal.   BP 156/100  Pulse 63  Temp(Src) 98.3 F (36.8 C) (Oral)  Ht _0  (1.702 m)  Wt 273 lb (123.832 kg)  BMI 42.75 kg/m2    Results for orders placed in visit on 07/09/13  POCT GLYCOSYLATED HEMOGLOBIN (HGB A1C)      Result Value Ref Range   Hemoglobin A1C 6.4    POCT UA - MICROALBUMIN      Result Value Ref Range   Microalbumin Ur, POC 20    EKG_NSR_Mary-Margaret Amiri Tritch, FNP Spirometry - slight improvement with xopenex neb Chest x ray- mild bronchitic changes- no acute findings-Preliminary reading by Ronnald Collum, FNP  [facility]      Assessment & Plan:   1. HYPERTENSION   2. Gout, unspecified   3. DM   4. ASTHMA   5. ANXIETY   6. Prostate cancer screening   7. Dyspnea    Orders Placed This Encounter  Procedures  . DG Chest 2 View    Standing Status: Future     Number of Occurrences: 1     Standing Expiration Date: 09/08/2014    Order Specific Question:  Reason for Exam (SYMPTOM  OR DIAGNOSIS REQUIRED)    Answer:  routine    Order Specific Question:  Preferred imaging location?    Answer:  Internal  . CMP14+EGFR  . NMR, lipoprofile  . PSA, total and free  . Microalbumin, urine  . POCT glycosylated hemoglobin (Hb A1C)  .  POCT UA - Microalbumin  . EKG 12-Lead   Meds ordered this encounter  Medications  . DISCONTD: Fluticasone-Salmeterol (ADVAIR) 100-50 MCG/DOSE AEPB    Sig: Inhale 1 puff into the lungs 2 (two) times daily.    Dispense:  3 each    Refill:  1    Order Specific Question:  Supervising Provider    Answer:  Chipper Herb [[id number]]  . Fluticasone-Salmeterol (ADVAIR) 100-50 MCG/DOSE AEPB    Sig: Inhale 1 puff into the lungs 2 (two) times daily.    Dispense:  1  each    Refill:  1    Order Specific Question:  Supervising Provider    Answer:  Joycelyn Man   Added advair back to medications Must use CPAPmachine at night I f dysnea continues need to see cardiologist Labs pending Health maintenance reviewed Diet and exercise encouraged Continue all meds Follow up  In 3 months    [place], FNP

## 2013-07-10 LAB — CMP14+EGFR
ALT: 29 IU/L (ref 0–44)
AST: 20 IU/L (ref 0–40)
Albumin/Globulin Ratio: 2 (ref 1.1–2.5)
Albumin: 4.5 g/dL (ref 3.6–4.8)
Alkaline Phosphatase: 53 IU/L (ref 39–117)
BILIRUBIN TOTAL: 0.4 mg/dL (ref 0.0–1.2)
BUN/Creatinine Ratio: 14 (ref 10–22)
BUN: 12 mg/dL (ref 8–27)
CHLORIDE: 97 mmol/L (ref 97–108)
CO2: 25 mmol/L (ref 18–29)
Calcium: 9.4 mg/dL (ref 8.6–10.2)
Creatinine, Ser: 0.84 mg/dL (ref 0.76–1.27)
GFR calc non Af Amer: 91 mL/min/{1.73_m2} (ref >59)
GFR, EST AFRICAN AMERICAN: 105 mL/min/{1.73_m2} (ref >59)
GLUCOSE: 140 mg/dL — AB (ref 65–99)
Globulin, Total: 2.2 g/dL (ref 1.5–4.5)
POTASSIUM: 4.5 mmol/L (ref 3.5–5.2)
Sodium: 141 mmol/L (ref 134–144)
Total Protein: 6.7 g/dL (ref 6.0–8.5)

## 2013-07-10 LAB — PSA, TOTAL AND FREE
PSA, Free Pct: 35 %
PSA, Free: 0.07 ng/mL
PSA: 0.2 ng/mL (ref 0.0–4.0)

## 2013-07-10 LAB — NMR, LIPOPROFILE
Cholesterol: 114 mg/dL (ref 100–199)
HDL Cholesterol by NMR: 37 mg/dL — ABNORMAL LOW (ref >39)
HDL Particle Number: 28.8 umol/L — ABNORMAL LOW (ref >=30.5)
LDL Particle Number: 791 nmol/L (ref <1000)
LDL SIZE: 20.1 nm (ref >20.5)
LDLC SERPL CALC-MCNC: 46 mg/dL (ref 0–99)
LP-IR SCORE: 77 — AB (ref <=45)
Small LDL Particle Number: 469 nmol/L (ref <=527)
Triglycerides by NMR: 157 mg/dL — ABNORMAL HIGH (ref 0–149)

## 2013-07-10 LAB — MICROALBUMIN, URINE: Microalbumin, Urine: 37 ug/mL — ABNORMAL HIGH (ref 0.0–17.0)

## 2013-07-11 ENCOUNTER — Telehealth: Payer: Self-pay | Admitting: Nurse Practitioner

## 2013-07-11 NOTE — Telephone Encounter (Signed)
Patient wife aware and will call and check with pharmacy

## 2013-07-11 NOTE — Telephone Encounter (Signed)
Call pharmacy and see if sybicort would be cheaper for u

## 2013-07-17 NOTE — Addendum Note (Signed)
Addended by: Ilean China on: 07/17/2013 11:10 AM   Modules accepted: Orders

## 2013-08-06 ENCOUNTER — Ambulatory Visit: Payer: Medicare Other | Admitting: Nurse Practitioner

## 2013-08-08 ENCOUNTER — Other Ambulatory Visit: Payer: Self-pay | Admitting: *Deleted

## 2013-08-08 ENCOUNTER — Other Ambulatory Visit: Payer: Self-pay | Admitting: Neurosurgery

## 2013-08-08 DIAGNOSIS — M545 Low back pain, unspecified: Secondary | ICD-10-CM

## 2013-08-08 MED ORDER — AMLODIPINE BESYLATE 10 MG PO TABS: 5.0000 mg | ORAL_TABLET | Freq: Every day | ORAL | Status: DC

## 2013-08-21 ENCOUNTER — Other Ambulatory Visit: Payer: Medicare Other

## 2013-08-28 ENCOUNTER — Telehealth: Payer: Self-pay | Admitting: Nurse Practitioner

## 2013-08-28 ENCOUNTER — Other Ambulatory Visit: Payer: Self-pay | Admitting: *Deleted

## 2013-08-28 ENCOUNTER — Ambulatory Visit
Admission: RE | Admit: 2013-08-28 | Discharge: 2013-08-28 | Disposition: A | Discharge status: Home / Self Care | Payer: Medicare Other | Source: Ambulatory Visit | Attending: Neurosurgery | Admitting: Neurosurgery

## 2013-08-28 DIAGNOSIS — M545 Low back pain, unspecified: Secondary | ICD-10-CM

## 2013-08-28 MED ORDER — PRAVASTATIN SODIUM 40 MG PO TABS: 40.0000 mg | ORAL_TABLET | Freq: Every day | ORAL | Status: DC

## 2013-08-28 MED ORDER — METOPROLOL TARTRATE 50 MG PO TABS: ORAL_TABLET | ORAL | Status: DC

## 2013-08-28 NOTE — Telephone Encounter (Signed)
done

## 2013-09-26 ENCOUNTER — Encounter: Payer: Self-pay | Admitting: *Deleted

## 2013-09-28 ENCOUNTER — Other Ambulatory Visit: Payer: Self-pay | Admitting: Specialist

## 2013-09-28 DIAGNOSIS — M1711 Unilateral primary osteoarthritis, right knee: Secondary | ICD-10-CM

## 2013-09-28 DIAGNOSIS — R2241 Localized swelling, mass and lump, right lower limb: Secondary | ICD-10-CM

## 2013-10-02 ENCOUNTER — Ambulatory Visit
Admission: RE | Admit: 2013-10-02 | Discharge: 2013-10-02 | Disposition: A | Discharge status: Home / Self Care | Payer: Medicare Other | Source: Ambulatory Visit | Attending: Specialist | Admitting: Specialist

## 2013-10-02 DIAGNOSIS — M1711 Unilateral primary osteoarthritis, right knee: Secondary | ICD-10-CM

## 2013-10-02 DIAGNOSIS — R2241 Localized swelling, mass and lump, right lower limb: Secondary | ICD-10-CM

## 2013-10-02 MED ORDER — GADOBENATE DIMEGLUMINE 529 MG/ML IV SOLN
20.0000 mL | Freq: Once | INTRAVENOUS | Status: AC | PRN
  Administered 2013-10-02: 20 mL via INTRAVENOUS

## 2013-10-08 ENCOUNTER — Other Ambulatory Visit: Payer: Medicare Other

## 2013-10-24 ENCOUNTER — Other Ambulatory Visit: Payer: Self-pay | Admitting: Neurosurgery

## 2013-10-24 DIAGNOSIS — M4120 Other idiopathic scoliosis, site unspecified: Secondary | ICD-10-CM

## 2013-10-25 ENCOUNTER — Ambulatory Visit (INDEPENDENT_AMBULATORY_CARE_PROVIDER_SITE_OTHER): Payer: Medicare Other | Admitting: Nurse Practitioner

## 2013-10-25 ENCOUNTER — Encounter: Payer: Self-pay | Admitting: Nurse Practitioner

## 2013-10-25 VITALS — BP 143/88 | HR 67 | Temp 98.3°F | Ht 67.0 in | Wt 278.0 lb

## 2013-10-25 DIAGNOSIS — E119 Type 2 diabetes mellitus without complications: Secondary | ICD-10-CM

## 2013-10-25 DIAGNOSIS — R5383 Other fatigue: Secondary | ICD-10-CM

## 2013-10-25 DIAGNOSIS — F411 Generalized anxiety disorder: Secondary | ICD-10-CM

## 2013-10-25 DIAGNOSIS — I10 Essential (primary) hypertension: Secondary | ICD-10-CM

## 2013-10-25 DIAGNOSIS — M109 Gout, unspecified: Secondary | ICD-10-CM

## 2013-10-25 DIAGNOSIS — E785 Hyperlipidemia, unspecified: Secondary | ICD-10-CM | POA: Insufficient documentation

## 2013-10-25 DIAGNOSIS — G4730 Sleep apnea, unspecified: Secondary | ICD-10-CM

## 2013-10-25 DIAGNOSIS — E1169 Type 2 diabetes mellitus with other specified complication: Secondary | ICD-10-CM | POA: Insufficient documentation

## 2013-10-25 DIAGNOSIS — K219 Gastro-esophageal reflux disease without esophagitis: Secondary | ICD-10-CM

## 2013-10-25 DIAGNOSIS — E876 Hypokalemia: Secondary | ICD-10-CM

## 2013-10-25 DIAGNOSIS — R5381 Other malaise: Secondary | ICD-10-CM

## 2013-10-25 DIAGNOSIS — Z713 Dietary counseling and surveillance: Secondary | ICD-10-CM

## 2013-10-25 LAB — POCT GLYCOSYLATED HEMOGLOBIN (HGB A1C): HEMOGLOBIN A1C: 6.3

## 2013-10-25 NOTE — Patient Instructions (Signed)

## 2013-10-25 NOTE — Progress Notes (Signed)
Subjective:    Patient ID: Kenneth Fuller, male    DOB: 04/17/1945, 68 y.o.   MRN: [id number]  Patient here today for follow up of chronic medical problems. Patient has been having back problems and is now walking with a cane- He is seeing specialist.   Diabetes He presents for his follow-up diabetic visit. He has type 2 diabetes mellitus. No MedicAlert identification noted. The initial diagnosis of diabetes was made 4 years ago. His disease course has been stable. Pertinent negatives for hypoglycemia include no confusion, dizziness, headaches or sweats. Pertinent negatives for diabetes include no blurred vision, no chest pain, no polydipsia, no polyphagia, no polyuria, no weakness and no weight loss. There are no hypoglycemic complications. Symptoms are stable. There are no diabetic complications. Pertinent negatives for diabetic complications include no impotence. Risk factors for coronary artery disease include dyslipidemia, male sex, obesity and hypertension. Current diabetic treatment includes diet and oral agent (monotherapy). He is compliant with treatment most of the time. His weight is stable. When asked about meal planning, he reported none. He has not had a previous visit with a dietician. He rarely participates in exercise. There is no change in his home blood glucose trend. His breakfast blood glucose is taken between 8-9 am. His breakfast blood glucose range is generally 110-130 mg/dl. His overall blood glucose range is 110-130 mg/dl. An ACE inhibitor/angiotensin II receptor blocker is being taken. He does not see a podiatrist.Eye exam is current (over a year ago).  Hypertension This is a chronic problem. The current episode started more than 1 year ago. The problem has been waxing and waning since onset. The problem is controlled. Associated symptoms include anxiety and shortness of breath. Pertinent negatives include no blurred vision, chest pain, headaches, orthopnea, palpitations,  peripheral edema or sweats. There are no associated agents to hypertension. Risk factors for coronary artery disease include obesity, male gender, dyslipidemia and diabetes mellitus. Past treatments include beta blockers, calcium channel blockers and diuretics. The current treatment provides significant improvement. Compliance problems include exercise and diet.   Hyperlipidemia This is a chronic problem. The current episode started more than 1 year ago. The problem is uncontrolled. Recent lipid tests were reviewed and are high. Exacerbating diseases include obesity. He has no history of hypothyroidism or liver disease. Factors aggravating his hyperlipidemia include thiazides. Associated symptoms include shortness of breath. Pertinent negatives include no chest pain or myalgias. Current antihyperlipidemic treatment includes statins. The current treatment provides significant improvement of lipids. Compliance problems include adherence to diet and adherence to exercise.  Risk factors for coronary artery disease include hypertension and diabetes mellitus.  Anxiety Presents for follow-up visit. Symptoms include nausea and shortness of breath. Patient reports no chest pain, confusion, decreased concentration, dizziness, dry mouth, feeling of choking, impotence, insomnia, irritability or palpitations. Symptoms occur occasionally. The severity of symptoms is mild. The quality of sleep is fair. Nighttime awakenings: none.   His past medical history is significant for asthma.  Asthma He complains of shortness of breath. There is no cough, hoarse voice, sputum production or wheezing. This is a chronic problem. The current episode started more than 1 year ago. The problem occurs intermittently. The problem has been waxing and waning. Pertinent negatives include no chest pain, headaches, myalgias, sweats or weight loss. His symptoms are aggravated by nothing. His past medical history is significant for asthma.   Hypokalemia Occassional lower ext cramping. Gerd Controlled with omeprazole- No symptoms when takes meds. Peripheral edema Lasix 40 mg daily.   Patient says if he misses a pill then he starts to swell     Review of Systems  Constitutional: Negative for weight loss and irritability.  HENT: Negative for hoarse voice.   Eyes: Negative for blurred vision.  Respiratory: Positive for shortness of breath. Negative for cough, sputum production and wheezing.   Cardiovascular: Negative for chest pain, palpitations and orthopnea.  Gastrointestinal: Positive for nausea.  Endocrine: Negative for polydipsia, polyphagia and polyuria.  Genitourinary: Negative for impotence.  Musculoskeletal: Negative for myalgias.  Neurological: Negative for dizziness, weakness and headaches.  Psychiatric/Behavioral: Negative for confusion and decreased concentration. The patient does not have insomnia.   All other systems reviewed and are negative.      Objective:   Physical Exam  Constitutional: He is oriented to person, place, and time. He appears well-developed and well-nourished.  HENT:  Head: Normocephalic.  Right Ear: External ear normal.  Left Ear: External ear normal.  Nose: Nose normal.  Mouth/Throat: Oropharynx is clear and moist.  Eyes: EOM are normal. Pupils are equal, round, and reactive to light.  Neck: Normal range of motion. Neck supple. No thyromegaly present.  Cardiovascular: Normal rate, regular rhythm, normal heart sounds and intact distal pulses.   No murmur heard. Pulmonary/Chest: Effort normal and breath sounds normal. He has no wheezes. He has no rales.  Abdominal: Soft. Bowel sounds are normal.  Musculoskeletal: Normal range of motion.  Currently walking with cane- gait unsteady  Neurological: He is alert and oriented to person, place, and time.  Skin: Skin is warm and dry.  Callus formation bil heels  Psychiatric: He has a normal mood and affect. His behavior is normal.  Judgment and thought content normal.   BP 143/88  Pulse 67  Temp(Src) 98.3 F (36.8 C) (Oral)  Ht 5' 7" (1.702 m)  Wt 278 lb (126.1 kg)  BMI 43.53 kg/m2    Results for orders placed in visit on 10/25/13  POCT GLYCOSYLATED HEMOGLOBIN (HGB A1C)      Result Value Ref Range   Hemoglobin A1C 6.3          Assessment & Plan:   1. Type II or unspecified type diabetes mellitus without mention of complication, not stated as uncontrolled - POCT glycosylated hemoglobin (Hb A1C) - NMR, lipoprofile  2. HYPERTENSION - CMP14+EGFR  3. SLEEP APNEA  4. Gout, unspecified  5. FATIGUE, CHRONIC  6. ANXIETY  7. Hypokalemia  8. Gastroesophageal reflux disease without esophagitis  9. Hyperlipidemia with target LDL less than 100  10. BMI >40 Discussed diet and exercise for person with BMI >25 Will recheck weight in 3-6 months  Keep appointments with specialist Labs pending Health maintenance reviewed Diet and exercise encouraged Continue all meds Follow up  In 3 months   [place], FNP

## 2013-10-26 ENCOUNTER — Ambulatory Visit
Admission: RE | Admit: 2013-10-26 | Discharge: 2013-10-26 | Disposition: A | Discharge status: Home / Self Care | Payer: Medicare Other | Source: Ambulatory Visit | Attending: Neurosurgery | Admitting: Neurosurgery

## 2013-10-26 DIAGNOSIS — M4120 Other idiopathic scoliosis, site unspecified: Secondary | ICD-10-CM

## 2013-10-26 LAB — CMP14+EGFR
A/G RATIO: 1.6 (ref 1.1–2.5)
ALT: 24 IU/L (ref 0–44)
AST: 13 IU/L (ref 0–40)
Albumin: 3.9 g/dL (ref 3.6–4.8)
Alkaline Phosphatase: 58 IU/L (ref 39–117)
BUN/Creatinine Ratio: 18 (ref 10–22)
BUN: 16 mg/dL (ref 8–27)
CO2: 27 mmol/L (ref 18–29)
Calcium: 9.4 mg/dL (ref 8.6–10.2)
Chloride: 99 mmol/L (ref 97–108)
Creatinine, Ser: 0.91 mg/dL (ref 0.76–1.27)
GFR calc Af Amer: 100 mL/min/{1.73_m2} (ref >59)
GFR, EST NON AFRICAN AMERICAN: 87 mL/min/{1.73_m2} (ref >59)
Globulin, Total: 2.4 g/dL (ref 1.5–4.5)
Glucose: 123 mg/dL — ABNORMAL HIGH (ref 65–99)
POTASSIUM: 4.5 mmol/L (ref 3.5–5.2)
Sodium: 140 mmol/L (ref 134–144)
TOTAL PROTEIN: 6.3 g/dL (ref 6.0–8.5)
Total Bilirubin: 0.4 mg/dL (ref 0.0–1.2)

## 2013-10-26 LAB — NMR, LIPOPROFILE
CHOLESTEROL: 111 mg/dL (ref 100–199)
HDL CHOLESTEROL BY NMR: 36 mg/dL — AB (ref >39)
HDL Particle Number: 28.2 umol/L — ABNORMAL LOW (ref >=30.5)
LDL Particle Number: 578 nmol/L (ref <1000)
LDL Size: 20.6 nm (ref >20.5)
LDLC SERPL CALC-MCNC: 27 mg/dL (ref 0–99)
LP-IR Score: 71 — ABNORMAL HIGH (ref <=45)
SMALL LDL PARTICLE NUMBER: 197 nmol/L (ref <=527)
TRIGLYCERIDES BY NMR: 238 mg/dL — AB (ref 0–149)

## 2013-11-07 ENCOUNTER — Other Ambulatory Visit: Payer: Self-pay

## 2013-11-07 MED ORDER — GLUCOSE BLOOD VI STRP: ORAL_STRIP | Status: DC

## 2013-11-08 ENCOUNTER — Other Ambulatory Visit: Payer: Self-pay | Admitting: *Deleted

## 2013-11-08 MED ORDER — GLUCOSE BLOOD VI STRP: ORAL_STRIP | Status: DC

## 2013-11-27 ENCOUNTER — Other Ambulatory Visit (HOSPITAL_COMMUNITY): Payer: Self-pay | Admitting: Orthopedic Surgery

## 2013-11-27 ENCOUNTER — Ambulatory Visit
Admission: RE | Admit: 2013-11-27 | Discharge: 2013-11-27 | Disposition: A | Discharge status: Home / Self Care | Payer: Medicare Other | Source: Ambulatory Visit | Attending: Orthopedic Surgery | Admitting: Orthopedic Surgery

## 2013-11-27 ENCOUNTER — Other Ambulatory Visit: Payer: Self-pay | Admitting: Orthopedic Surgery

## 2013-11-27 VITALS — BP 135/78 | HR 80

## 2013-11-27 DIAGNOSIS — M48061 Spinal stenosis, lumbar region without neurogenic claudication: Secondary | ICD-10-CM

## 2013-11-27 MED ORDER — IOHEXOL 180 MG/ML  SOLN
15.0000 mL | Freq: Once | INTRAMUSCULAR | Status: AC | PRN
  Administered 2013-11-27: 15 mL via INTRATHECAL

## 2013-11-27 MED ORDER — ONDANSETRON HCL 4 MG/2ML IJ SOLN
4.0000 mg | Freq: Once | INTRAMUSCULAR | Status: AC
  Administered 2013-11-27: 4 mg via INTRAMUSCULAR

## 2013-11-27 MED ORDER — DIAZEPAM 5 MG PO TABS
5.0000 mg | ORAL_TABLET | Freq: Once | ORAL | Status: AC
  Administered 2013-11-27: 5 mg via ORAL

## 2013-11-27 MED ORDER — MEPERIDINE HCL 100 MG/ML IJ SOLN
100.0000 mg | Freq: Once | INTRAMUSCULAR | Status: AC
  Administered 2013-11-27: 100 mg via INTRAMUSCULAR

## 2013-11-27 NOTE — Discharge Instructions (Signed)

## 2013-11-29 ENCOUNTER — Other Ambulatory Visit: Payer: Self-pay | Admitting: *Deleted

## 2013-11-29 MED ORDER — POTASSIUM CHLORIDE ER 10 MEQ PO TBCR: 10.0000 meq | EXTENDED_RELEASE_TABLET | Freq: Every day | ORAL | Status: DC

## 2013-12-03 ENCOUNTER — Telehealth: Payer: Self-pay | Admitting: Nurse Practitioner

## 2013-12-03 NOTE — Telephone Encounter (Signed)
Pt wants needs clearance for back surgery. Can you go without seeing him. Seen last month.

## 2013-12-04 ENCOUNTER — Ambulatory Visit (INDEPENDENT_AMBULATORY_CARE_PROVIDER_SITE_OTHER): Payer: Medicare Other

## 2013-12-04 ENCOUNTER — Encounter: Payer: Self-pay | Admitting: Nurse Practitioner

## 2013-12-04 ENCOUNTER — Other Ambulatory Visit: Payer: Self-pay | Admitting: *Deleted

## 2013-12-04 ENCOUNTER — Ambulatory Visit (INDEPENDENT_AMBULATORY_CARE_PROVIDER_SITE_OTHER): Payer: Medicare Other | Admitting: Nurse Practitioner

## 2013-12-04 VITALS — BP 128/83 | HR 74 | Temp 97.8°F | Ht 67.0 in | Wt 279.0 lb

## 2013-12-04 DIAGNOSIS — Z23 Encounter for immunization: Secondary | ICD-10-CM

## 2013-12-04 DIAGNOSIS — Z01818 Encounter for other preprocedural examination: Secondary | ICD-10-CM

## 2013-12-04 NOTE — Patient Instructions (Signed)
Health Maintenance A healthy lifestyle and preventative care can promote health and wellness.  Maintain regular health, dental, and eye exams.  Eat a healthy diet. Foods like vegetables, fruits, whole grains, low-fat dairy products, and lean protein foods contain the nutrients you need and are low in calories. Decrease your intake of foods high in solid fats, added sugars, and salt. Get information about a proper diet from your health care provider, if necessary.  Regular physical exercise is one of the most important things you can do for your health. Most adults should get at least 150 minutes of moderate-intensity exercise (any activity that increases your heart rate and causes you to sweat) each week. In addition, most adults need muscle-strengthening exercises on 2 or more days a week.   Maintain a healthy weight. The body mass index (BMI) is a screening tool to identify possible weight problems. It provides an estimate of body fat based on height and weight. Your health care provider can find your BMI and can help you achieve or maintain a healthy weight. For males 20 years and older:  A BMI below 18.5 is considered underweight.  A BMI of 18.5 to 24.9 is normal.  A BMI of 25 to 29.9 is considered overweight.  A BMI of 30 and above is considered obese.  Maintain normal blood lipids and cholesterol by exercising and minimizing your intake of saturated fat. Eat a balanced diet with plenty of fruits and vegetables. Blood tests for lipids and cholesterol should begin at age 23 and be repeated every 5 years. If your lipid or cholesterol levels are high, you are over age 74, or you are at high risk for heart disease, you may need your cholesterol levels checked more frequently.Ongoing high lipid and cholesterol levels should be treated with medicines if diet and exercise are not working.  If you smoke, find out from your health care provider how to quit. If you do not use tobacco, do not  start.  Lung cancer screening is recommended for adults aged 67-80 years who are at high risk for developing lung cancer because of a history of smoking. A yearly low-dose CT scan of the lungs is recommended for people who have at least a 30-pack-year history of smoking and are current smokers or have quit within the past 15 years. A pack year of smoking is smoking an average of 1 pack of cigarettes a day for 1 year (for example, a 30-pack-year history of smoking could mean smoking 1 pack a day for 30 years or 2 packs a day for 15 years). Yearly screening should continue until the smoker has stopped smoking for at least 15 years. Yearly screening should be stopped for people who develop a health problem that would prevent them from having lung cancer treatment.  If you choose to drink alcohol, do not have more than 2 drinks per day. One drink is considered to be 12 oz (360 mL) of beer, 5 oz (150 mL) of wine, or 1.5 oz (45 mL) of liquor.  Avoid the use of street drugs. Do not share needles with anyone. Ask for help if you need support or instructions about stopping the use of drugs.  High blood pressure causes heart disease and increases the risk of stroke. Blood pressure should be checked at least every 1-2 years. Ongoing high blood pressure should be treated with medicines if weight loss and exercise are not effective.  If you are 49-42 years old, ask your health care provider if  you should take aspirin to prevent heart disease.  Diabetes screening involves taking a blood sample to check your fasting blood sugar level. This should be done once every 3 years after age 58 if you are at a normal weight and without risk factors for diabetes. Testing should be considered at a younger age or be carried out more frequently if you are overweight and have at least 1 risk factor for diabetes.  Colorectal cancer can be detected and often prevented. Most routine colorectal cancer screening begins at the age of 71  and continues through age 13. However, your health care provider may recommend screening at an earlier age if you have risk factors for colon cancer. On a yearly basis, your health care provider may provide home test kits to check for hidden blood in the stool. A small camera at the end of a tube may be used to directly examine the colon (sigmoidoscopy or colonoscopy) to detect the earliest forms of colorectal cancer. Talk to your health care provider about this at age 35 when routine screening begins. A direct exam of the colon should be repeated every 5-10 years through age 65, unless early forms of precancerous polyps or small growths are found.  People who are at an increased risk for hepatitis B should be screened for this virus. You are considered at high risk for hepatitis B if:  You were born in a country where hepatitis B occurs often. Talk with your health care provider about which countries are considered high risk.  Your parents were born in a high-risk country and you have not received a shot to protect against hepatitis B (hepatitis B vaccine).  You have HIV or AIDS.  You use needles to inject street drugs.  You live with, or have sex with, someone who has hepatitis B.  You are a man who has sex with other men (MSM).  You get hemodialysis treatment.  You take certain medicines for conditions like cancer, organ transplantation, and autoimmune conditions.  Hepatitis C blood testing is recommended for all people born from [birth year] through 1964 and any individual with known risk factors for hepatitis C.  Healthy men should no longer receive prostate-specific antigen (PSA) blood tests as part of routine cancer screening. Talk to your health care provider about prostate cancer screening.  Testicular cancer screening is not recommended for adolescents or adult males who have no symptoms. Screening includes self-exam, a health care provider exam, and other screening tests. Consult with your  health care provider about any symptoms you have or any concerns you have about testicular cancer.  Practice safe sex. Use condoms and avoid high-risk sexual practices to reduce the spread of sexually transmitted infections (STIs).  You should be screened for STIs, including gonorrhea and chlamydia if:  You are sexually active and are younger than 24 years.  You are older than 24 years, and your health care provider tells you that you are at risk for this type of infection.  Your sexual activity has changed since you were last screened, and you are at an increased risk for chlamydia or gonorrhea. Ask your health care provider if you are at risk.  If you are at risk of being infected with HIV, it is recommended that you take a prescription medicine daily to prevent HIV infection. This is called pre-exposure prophylaxis (PrEP). You are considered at risk if:  You are a man who has sex with other men (MSM).  You are a heterosexual man who  is sexually active with multiple partners.  You take drugs by injection.  You are sexually active with a partner who has HIV.  Talk with your health care provider about whether you are at high risk of being infected with HIV. If you choose to begin PrEP, you should first be tested for HIV. You should then be tested every 3 months for as long as you are taking PrEP.  Use sunscreen. Apply sunscreen liberally and repeatedly throughout the day. You should seek shade when your shadow is shorter than you. Protect yourself by wearing long sleeves, pants, a wide-brimmed hat, and sunglasses year round whenever you are outdoors.  Tell your health care provider of new moles or changes in moles, especially if there is a change in shape or color. Also, tell your health care provider if a mole is larger than the size of a pencil eraser.  A one-time screening for abdominal aortic aneurysm (AAA) and surgical repair of large AAAs by ultrasound is recommended for men aged  45-75 years who are current or former smokers.  Stay current with your vaccines (immunizations). Document Released: 07/24/2007 Document Revised: 01/30/2013 Document Reviewed: 06/22/2010 [facility] Patient Information 2015 [place], [place]. This information is not intended to replace advice given to you by your health care provider. Make sure you discuss any questions you have with your health care provider.

## 2013-12-04 NOTE — Progress Notes (Signed)
   Subjective:    Patient ID: Kenneth Fuller, male    DOB: 1946-01-16, 68 y.o.   MRN: [id number]  HPI Patient in today for surgical clearance. He has been having back problems and they want to do surgery on him but he needs surgical clearance in order for them to schedule surgery. He has no complaints other than his chronic back pain- Pain radiates down his right leg and he is having to walk with a cane.    Review of Systems  Constitutional: Negative.   HENT: Negative.   Respiratory: Negative.   Cardiovascular: Positive for leg swelling.  Gastrointestinal: Negative.   Genitourinary: Negative.   Neurological: Negative.   Psychiatric/Behavioral: Negative.   All other systems reviewed and are negative.      Objective:   Physical Exam  Constitutional: He is oriented to person, place, and time. He appears well-developed and well-nourished.  HENT:  Head: Normocephalic.  Right Ear: External ear normal.  Left Ear: External ear normal.  Nose: Nose normal.  Mouth/Throat: Oropharynx is clear and moist.  Eyes: EOM are normal. Pupils are equal, round, and reactive to light.  Neck: Normal range of motion. Neck supple. No JVD present. No thyromegaly present.  Cardiovascular: Normal rate, regular rhythm, normal heart sounds and intact distal pulses.  Exam reveals no gallop and no friction rub.   No murmur heard. Pulmonary/Chest: Effort normal and breath sounds normal. No respiratory distress. He has no wheezes. He has no rales. He exhibits no tenderness.  Abdominal: Soft. Bowel sounds are normal. He exhibits no mass. There is no tenderness.  Musculoskeletal: Normal range of motion. He exhibits no edema.  Lymphadenopathy:    He has no cervical adenopathy.  Neurological: He is alert and oriented to person, place, and time. No cranial nerve deficit.  Skin: Skin is warm and dry.  Psychiatric: He has a normal mood and affect. His behavior is normal. Judgment and thought content normal.   BP  128/83  Pulse 74  Temp(Src) 97.8 F (36.6 C) (Oral)  Ht _0  (1.702 m)  Wt 279 lb (126.554 kg)  BMI 43.69 kg/m2  Adella Nissen, FNP Chest x ray- chroinic bronchitic changes-Preliminary reading by Ronnald Collum, FNP  [facility]       Assessment & Plan:   1. Preop general physical exam    Surgical clearance granted Letter to be sent to Dr. Raeanne Barry, FNP

## 2013-12-05 ENCOUNTER — Other Ambulatory Visit: Payer: Self-pay | Admitting: Surgical

## 2013-12-19 ENCOUNTER — Other Ambulatory Visit: Payer: Self-pay | Admitting: *Deleted

## 2013-12-19 MED ORDER — AMLODIPINE BESYLATE 10 MG PO TABS: 5.0000 mg | ORAL_TABLET | Freq: Every day | ORAL | Status: DC

## 2013-12-25 NOTE — Patient Instructions (Addendum)
Kenneth Fuller  12/25/2013   Your procedure is scheduled on:  01/02/2014    Come thru the Emergency Room entrance.   Follow the Signs to [facility] at   0530     am  Call this number if you have problems the morning of surgery: [phone]   Remember: Eat a good healthy snack prior to bedtime.  Bring CPAP mask and tubing.     Do not eat food or drink liquids after midnight.   Take these medicines the morning of surgery with A SIP OF WATER: Amlidipine ( Norvasc), Advair, Metoprolol, Prilosec, Systane eye drops if needed    Do not wear jewelry,   Do not wear lotions, powders, or perfumes.  deodorant.  . Men may shave face and neck.  Do not bring valuables to the hospital.  Contacts, dentures or bridgework may not be worn into surgery.  Leave suitcase in the car. After surgery it may be brought to your room.  For patients admitted to the hospital, checkout time is 11:00 AM the day of  discharge.        Please read over the following fact sheets that you were given: MRSA Information, coughing and deep breathing exercises, leg exercises            [facility] - Preparing for Surgery Before surgery, you can play an important role.  Because skin is not sterile, your skin needs to be as free of germs as possible.  You can reduce the number of germs on your skin by washing with CHG (chlorahexidine gluconate) soap before surgery.  CHG is an antiseptic cleaner which kills germs and bonds with the skin to continue killing germs even after washing. Please DO NOT use if you have an allergy to CHG or antibacterial soaps.  If your skin becomes reddened/irritated stop using the CHG and inform your nurse when you arrive at Short Stay. Do not shave (including legs and underarms) for at least 48 hours prior to the first CHG shower.  You may shave your face/neck. Please follow these instructions carefully:  1.  Shower with CHG Soap the night before surgery and the  morning of Surgery.  2.  If you  choose to wash your hair, wash your hair first as usual with your  normal  shampoo.  3.  After you shampoo, rinse your hair and body thoroughly to remove the  shampoo.                           4.  Use CHG as you would any other liquid soap.  You can apply chg directly  to the skin and wash                       Gently with a scrungie or clean washcloth.  5.  Apply the CHG Soap to your body ONLY FROM THE NECK DOWN.   Do not use on face/ open                           Wound or open sores. Avoid contact with eyes, ears mouth and genitals (private parts).                       Wash face,  Genitals (private parts) with your normal soap.  6.  Wash thoroughly, paying special attention to the area where your surgery  will be performed.  7.  Thoroughly rinse your body with warm water from the neck down.  8.  DO NOT shower/wash with your normal soap after using and rinsing off  the CHG Soap.                9.  Pat yourself dry with a clean towel.            10.  Wear clean pajamas.            11.  Place clean sheets on your bed the night of your first shower and do not  sleep with pets. Day of Surgery : Do not apply any lotions/deodorants the morning of surgery.  Please wear clean clothes to the hospital/surgery center.  FAILURE TO FOLLOW THESE INSTRUCTIONS MAY RESULT IN THE CANCELLATION OF YOUR SURGERY PATIENT SIGNATURE_________________________________  NURSE SIGNATURE__________________________________  ________________________________________________________________________   Adam Phenix  An incentive spirometer is a tool that can help keep your lungs clear and active. This tool measures how well you are filling your lungs with each breath. Taking long deep breaths may help reverse or decrease the chance of developing breathing (pulmonary) problems (especially infection) following:  A long period of time when you are unable to move or be active. BEFORE THE PROCEDURE   If  the spirometer includes an indicator to show your best effort, your nurse or respiratory therapist will set it to a desired goal.  If possible, sit up straight or lean slightly forward. Try not to slouch.  Hold the incentive spirometer in an upright position. INSTRUCTIONS FOR USE  1. Sit on the edge of your bed if possible, or sit up as far as you can in bed or on a chair. 2. Hold the incentive spirometer in an upright position. 3. Breathe out normally. 4. Place the mouthpiece in your mouth and seal your lips tightly around it. 5. Breathe in slowly and as deeply as possible, raising the piston or the ball toward the top of the column. 6. Hold your breath for 3-5 seconds or for as long as possible. Allow the piston or ball to fall to the bottom of the column. 7. Remove the mouthpiece from your mouth and breathe out normally. 8. Rest for a few seconds and repeat Steps 1 through 7 at least 10 times every 1-2 hours when you are awake. Take your time and take a few normal breaths between deep breaths. 9. The spirometer may include an indicator to show your best effort. Use the indicator as a goal to work toward during each repetition. 10. After each set of 10 deep breaths, practice coughing to be sure your lungs are clear. If you have an incision (the cut made at the time of surgery), support your incision when coughing by placing a pillow or rolled up towels firmly against it. Once you are able to get out of bed, walk around indoors and cough well. You may stop using the incentive spirometer when instructed by your caregiver.  RISKS AND COMPLICATIONS  Take your time so you do not get dizzy or light-headed.  If you are in pain, you may need to take or ask for pain medication before doing incentive spirometry. It is harder to take a deep breath if you are having pain. AFTER USE  Rest and breathe slowly and easily.  It can be helpful to keep track of a log of your  progress. Your caregiver can  provide you with a simple table to help with this. If you are using the spirometer at home, follow these instructions: [place] IF:   You are having difficultly using the spirometer.  You have trouble using the spirometer as often as instructed.  Your pain medication is not giving enough relief while using the spirometer.  You develop fever of 100.5 F (38.1 C) or higher. SEEK IMMEDIATE MEDICAL CARE IF:   You cough up bloody sputum that had not been present before.  You develop fever of 102 F (38.9 C) or greater.  You develop worsening pain at or near the incision site. MAKE SURE YOU:   Understand these instructions.  Will watch your condition.  Will get help right away if you are not doing well or get worse. Document Released: 06/07/2006 Document Revised: 04/19/2011 Document Reviewed: 08/08/2006 [facility] Patient Information 2014 [place], [place].   ________________________________________________________________________

## 2013-12-26 ENCOUNTER — Ambulatory Visit (HOSPITAL_COMMUNITY)
Admission: RE | Admit: 2013-12-26 | Discharge: 2013-12-26 | Disposition: A | Discharge status: Home / Self Care | Payer: Medicare Other | Source: Ambulatory Visit | Attending: Surgical | Admitting: Surgical

## 2013-12-26 ENCOUNTER — Encounter (HOSPITAL_COMMUNITY): Payer: Self-pay

## 2013-12-26 ENCOUNTER — Encounter (HOSPITAL_COMMUNITY)
Admission: RE | Admit: 2013-12-26 | Discharge: 2013-12-26 | Disposition: A | Discharge status: Home / Self Care | Payer: Medicare Other | Source: Ambulatory Visit | Attending: Orthopedic Surgery | Admitting: Orthopedic Surgery

## 2013-12-26 DIAGNOSIS — Z87891 Personal history of nicotine dependence: Secondary | ICD-10-CM | POA: Diagnosis not present

## 2013-12-26 DIAGNOSIS — M4806 Spinal stenosis, lumbar region: Secondary | ICD-10-CM | POA: Diagnosis not present

## 2013-12-26 DIAGNOSIS — M5136 Other intervertebral disc degeneration, lumbar region: Secondary | ICD-10-CM | POA: Insufficient documentation

## 2013-12-26 DIAGNOSIS — E119 Type 2 diabetes mellitus without complications: Secondary | ICD-10-CM | POA: Insufficient documentation

## 2013-12-26 DIAGNOSIS — M48061 Spinal stenosis, lumbar region without neurogenic claudication: Secondary | ICD-10-CM

## 2013-12-26 DIAGNOSIS — I10 Essential (primary) hypertension: Secondary | ICD-10-CM | POA: Insufficient documentation

## 2013-12-26 DIAGNOSIS — Z01818 Encounter for other preprocedural examination: Secondary | ICD-10-CM

## 2013-12-26 DIAGNOSIS — M47816 Spondylosis without myelopathy or radiculopathy, lumbar region: Secondary | ICD-10-CM | POA: Insufficient documentation

## 2013-12-26 DIAGNOSIS — M4322 Fusion of spine, cervical region: Secondary | ICD-10-CM | POA: Diagnosis not present

## 2013-12-26 HISTORY — DX: Gastro-esophageal reflux disease without esophagitis: K21.9

## 2013-12-26 HISTORY — DX: Reserved for inherently not codable concepts without codable children: IMO0001

## 2013-12-26 LAB — COMPREHENSIVE METABOLIC PANEL
ALT: 26 U/L (ref 0–53)
AST: 18 U/L (ref 0–37)
Albumin: 4.1 g/dL (ref 3.5–5.2)
Alkaline Phosphatase: 56 U/L (ref 39–117)
Anion gap: 11 (ref 5–15)
BUN: 17 mg/dL (ref 6–23)
CO2: 29 mEq/L (ref 19–32)
Calcium: 9.7 mg/dL (ref 8.4–10.5)
Chloride: 95 mEq/L — ABNORMAL LOW (ref 96–112)
Creatinine, Ser: 0.9 mg/dL (ref 0.50–1.35)
GFR calc Af Amer: >90 mL/min (ref >90)
GFR calc non Af Amer: 86 mL/min — ABNORMAL LOW (ref >90)
Glucose, Bld: 135 mg/dL — ABNORMAL HIGH (ref 70–99)
Potassium: 4.4 mEq/L (ref 3.7–5.3)
Sodium: 135 mEq/L — ABNORMAL LOW (ref 137–147)
Total Bilirubin: 0.4 mg/dL (ref 0.3–1.2)
Total Protein: 7.7 g/dL (ref 6.0–8.3)

## 2013-12-26 LAB — URINALYSIS, ROUTINE W REFLEX MICROSCOPIC
Bilirubin Urine: NEGATIVE
Glucose, UA: NEGATIVE mg/dL
Hgb urine dipstick: NEGATIVE
Ketones, ur: NEGATIVE mg/dL
Leukocytes, UA: NEGATIVE
Nitrite: NEGATIVE
Protein, ur: NEGATIVE mg/dL
Specific Gravity, Urine: 1.011 (ref 1.005–1.030)
Urobilinogen, UA: 0.2 mg/dL (ref 0.0–1.0)
pH: 5.5 (ref 5.0–8.0)

## 2013-12-26 LAB — CBC
HCT: 46.3 % (ref 39.0–52.0)
HEMOGLOBIN: 15.1 g/dL (ref 13.0–17.0)
MCH: 27.6 pg (ref 26.0–34.0)
MCHC: 32.6 g/dL (ref 30.0–36.0)
MCV: 84.6 fL (ref 78.0–100.0)
Platelets: 268 10*3/uL (ref 150–400)
RBC: 5.47 MIL/uL (ref 4.22–5.81)
RDW: 14.3 % (ref 11.5–15.5)
WBC: 10.3 10*3/uL (ref 4.0–10.5)

## 2013-12-26 LAB — PROTIME-INR
INR: 1.01 (ref 0.00–1.49)
Prothrombin Time: 13.4 seconds (ref 11.6–15.2)

## 2013-12-26 LAB — SURGICAL PCR SCREEN
MRSA, PCR: NEGATIVE
STAPHYLOCOCCUS AUREUS: POSITIVE — AB

## 2013-12-26 NOTE — Progress Notes (Signed)
EKG- 12/04/2013 - EPIC  CXR- 12/04/2013 EPIC  ECHO- 2014 EPIC  Preop exam with Dr Ronnald Collum- 12/04/13 EPIC

## 2014-01-01 MED ORDER — DEXTROSE 5 % IV SOLN
3.0000 g | INTRAVENOUS | Status: AC
  Administered 2014-01-02: 3 g via INTRAVENOUS
  Filled 2014-01-01: qty 3000

## 2014-01-01 NOTE — Anesthesia Preprocedure Evaluation (Addendum)
Anesthesia Evaluation  Patient identified by MRN, date of birth, ID band Patient awake    Reviewed: Allergy & Precautions, H&P , NPO status , Patient's Chart, lab work & pertinent test results  Airway Mallampati: II  TM Distance: >3 FB Neck ROM: Full    Dental no notable dental hx.    Pulmonary shortness of breath, asthma , sleep apnea , former smoker,  breath sounds clear to auscultation  Pulmonary exam normal       Cardiovascular hypertension, Pt. on medications + CAD Rhythm:Regular Rate:Normal     Neuro/Psych  Headaches, Anxiety negative psych ROS   GI/Hepatic Neg liver ROS, GERD-  Medicated,  Endo/Other  diabetes, Type 2, Oral Hypoglycemic AgentsMorbid obesity  Renal/GU negative Renal ROS  negative genitourinary   Musculoskeletal  (+) Fibromyalgia -  Abdominal   Peds  Hematology negative hematology ROS (+)   Anesthesia Other Findings   Reproductive/Obstetrics negative OB ROS                            Anesthesia Physical Anesthesia Plan  ASA: III  Anesthesia Plan: General   Post-op Pain Management:    Induction: Intravenous  Airway Management Planned: Oral ETT  Additional Equipment: None  Intra-op Plan:   Post-operative Plan: Extubation in OR  Informed Consent: I have reviewed the patients History and Physical, chart, labs and discussed the procedure including the risks, benefits and alternatives for the proposed anesthesia with the patient or authorized representative who has indicated his/her understanding and acceptance.   Dental advisory given  Plan Discussed with: CRNA  Anesthesia Plan Comments:         Anesthesia Quick Evaluation

## 2014-01-02 ENCOUNTER — Encounter (HOSPITAL_COMMUNITY)
Admission: RE | Disposition: A | Discharge status: Home / Self Care | Payer: Self-pay | Source: Ambulatory Visit | Attending: Orthopedic Surgery

## 2014-01-02 ENCOUNTER — Ambulatory Visit (HOSPITAL_COMMUNITY): Payer: Medicare Other

## 2014-01-02 ENCOUNTER — Ambulatory Visit (HOSPITAL_COMMUNITY): Payer: Medicare Other | Admitting: Anesthesiology

## 2014-01-02 ENCOUNTER — Observation Stay (HOSPITAL_COMMUNITY)
Admission: RE | Admit: 2014-01-02 | Discharge: 2014-01-03 | Disposition: A | Discharge status: Home / Self Care | Payer: Medicare Other | Source: Ambulatory Visit | Attending: Orthopedic Surgery | Admitting: Orthopedic Surgery

## 2014-01-02 ENCOUNTER — Encounter (HOSPITAL_COMMUNITY): Payer: Self-pay | Admitting: *Deleted

## 2014-01-02 DIAGNOSIS — Z981 Arthrodesis status: Secondary | ICD-10-CM | POA: Insufficient documentation

## 2014-01-02 DIAGNOSIS — Z87891 Personal history of nicotine dependence: Secondary | ICD-10-CM | POA: Diagnosis not present

## 2014-01-02 DIAGNOSIS — Z6841 Body Mass Index (BMI) 40.0 and over, adult: Secondary | ICD-10-CM | POA: Diagnosis not present

## 2014-01-02 DIAGNOSIS — Z8614 Personal history of Methicillin resistant Staphylococcus aureus infection: Secondary | ICD-10-CM | POA: Insufficient documentation

## 2014-01-02 DIAGNOSIS — E669 Obesity, unspecified: Secondary | ICD-10-CM | POA: Insufficient documentation

## 2014-01-02 DIAGNOSIS — I2510 Atherosclerotic heart disease of native coronary artery without angina pectoris: Secondary | ICD-10-CM | POA: Insufficient documentation

## 2014-01-02 DIAGNOSIS — E291 Testicular hypofunction: Secondary | ICD-10-CM | POA: Diagnosis not present

## 2014-01-02 DIAGNOSIS — E119 Type 2 diabetes mellitus without complications: Secondary | ICD-10-CM | POA: Insufficient documentation

## 2014-01-02 DIAGNOSIS — M4806 Spinal stenosis, lumbar region: Secondary | ICD-10-CM | POA: Diagnosis present

## 2014-01-02 DIAGNOSIS — E785 Hyperlipidemia, unspecified: Secondary | ICD-10-CM | POA: Diagnosis not present

## 2014-01-02 DIAGNOSIS — K219 Gastro-esophageal reflux disease without esophagitis: Secondary | ICD-10-CM | POA: Diagnosis not present

## 2014-01-02 DIAGNOSIS — I10 Essential (primary) hypertension: Secondary | ICD-10-CM | POA: Diagnosis not present

## 2014-01-02 DIAGNOSIS — M48062 Spinal stenosis, lumbar region with neurogenic claudication: Secondary | ICD-10-CM | POA: Diagnosis present

## 2014-01-02 DIAGNOSIS — Z800 Family history of malignant neoplasm of digestive organs: Secondary | ICD-10-CM | POA: Diagnosis not present

## 2014-01-02 DIAGNOSIS — M109 Gout, unspecified: Secondary | ICD-10-CM | POA: Diagnosis not present

## 2014-01-02 DIAGNOSIS — Z419 Encounter for procedure for purposes other than remedying health state, unspecified: Secondary | ICD-10-CM

## 2014-01-02 DIAGNOSIS — M21371 Foot drop, right foot: Secondary | ICD-10-CM | POA: Insufficient documentation

## 2014-01-02 DIAGNOSIS — M797 Fibromyalgia: Secondary | ICD-10-CM | POA: Insufficient documentation

## 2014-01-02 HISTORY — PX: LUMBAR LAMINECTOMY/DECOMPRESSION MICRODISCECTOMY: SHX5026

## 2014-01-02 LAB — GLUCOSE, CAPILLARY
GLUCOSE-CAPILLARY: 135 mg/dL — AB (ref 70–99)
GLUCOSE-CAPILLARY: 114 mg/dL — AB (ref 70–99)
Glucose-Capillary: 132 mg/dL — ABNORMAL HIGH (ref 70–99)
Glucose-Capillary: 148 mg/dL — ABNORMAL HIGH (ref 70–99)
Glucose-Capillary: 226 mg/dL — ABNORMAL HIGH (ref 70–99)

## 2014-01-02 SURGERY — LUMBAR LAMINECTOMY/DECOMPRESSION MICRODISCECTOMY 1 LEVEL
Anesthesia: General | Site: Back

## 2014-01-02 MED ORDER — NEOSTIGMINE METHYLSULFATE 10 MG/10ML IV SOLN
INTRAVENOUS | Status: DC | PRN
  Administered 2014-01-02: 4 mg via INTRAVENOUS

## 2014-01-02 MED ORDER — METFORMIN HCL 500 MG PO TABS
1000.0000 mg | ORAL_TABLET | Freq: Two times a day (BID) | ORAL | Status: DC
  Administered 2014-01-02 – 2014-01-03 (×2): 1000 mg via ORAL
  Filled 2014-01-02 (×4): qty 2

## 2014-01-02 MED ORDER — SODIUM CHLORIDE 0.9 % IJ SOLN
INTRAMUSCULAR | Status: AC
  Filled 2014-01-02: qty 10

## 2014-01-02 MED ORDER — SUCCINYLCHOLINE CHLORIDE 20 MG/ML IJ SOLN
INTRAMUSCULAR | Status: DC | PRN
  Administered 2014-01-02: 100 mg via INTRAVENOUS

## 2014-01-02 MED ORDER — ACETAMINOPHEN 650 MG RE SUPP: 650.0000 mg | RECTAL | Status: DC | PRN

## 2014-01-02 MED ORDER — MENTHOL 3 MG MT LOZG: 1.0000 | LOZENGE | OROMUCOSAL | Status: DC | PRN

## 2014-01-02 MED ORDER — BISACODYL 5 MG PO TBEC: 5.0000 mg | DELAYED_RELEASE_TABLET | Freq: Every day | ORAL | Status: DC | PRN

## 2014-01-02 MED ORDER — CEFAZOLIN SODIUM 1-5 GM-% IV SOLN
1.0000 g | Freq: Three times a day (TID) | INTRAVENOUS | Status: AC
  Administered 2014-01-02 – 2014-01-03 (×3): 1 g via INTRAVENOUS
  Filled 2014-01-02 (×3): qty 50

## 2014-01-02 MED ORDER — FLEET ENEMA 7-19 GM/118ML RE ENEM: 1.0000 | ENEMA | Freq: Once | RECTAL | Status: AC | PRN

## 2014-01-02 MED ORDER — ACETAMINOPHEN 325 MG PO TABS
650.0000 mg | ORAL_TABLET | ORAL | Status: DC | PRN
  Administered 2014-01-03: 650 mg via ORAL
  Filled 2014-01-02: qty 2

## 2014-01-02 MED ORDER — METOPROLOL TARTRATE 50 MG PO TABS
50.0000 mg | ORAL_TABLET | Freq: Every day | ORAL | Status: DC
  Administered 2014-01-02: 50 mg via ORAL
  Filled 2014-01-02 (×2): qty 1

## 2014-01-02 MED ORDER — ROCURONIUM BROMIDE 100 MG/10ML IV SOLN
INTRAVENOUS | Status: AC
  Filled 2014-01-02: qty 1

## 2014-01-02 MED ORDER — THROMBIN 5000 UNITS EX SOLR
OROMUCOSAL | Status: DC | PRN
  Administered 2014-01-02: 08:00:00 via TOPICAL

## 2014-01-02 MED ORDER — ONDANSETRON HCL 4 MG/2ML IJ SOLN
INTRAMUSCULAR | Status: AC
  Filled 2014-01-02: qty 2

## 2014-01-02 MED ORDER — ACETAMINOPHEN 10 MG/ML IV SOLN
1000.0000 mg | Freq: Once | INTRAVENOUS | Status: AC
  Administered 2014-01-02: 1000 mg via INTRAVENOUS
  Filled 2014-01-02: qty 100

## 2014-01-02 MED ORDER — EPHEDRINE SULFATE 50 MG/ML IJ SOLN
INTRAMUSCULAR | Status: AC
  Filled 2014-01-02: qty 1

## 2014-01-02 MED ORDER — KETOROLAC TROMETHAMINE 30 MG/ML IJ SOLN
30.0000 mg | Freq: Once | INTRAMUSCULAR | Status: AC
  Administered 2014-01-02: 30 mg via INTRAVENOUS

## 2014-01-02 MED ORDER — AMLODIPINE BESYLATE 10 MG PO TABS
10.0000 mg | ORAL_TABLET | Freq: Every day | ORAL | Status: DC
  Administered 2014-01-03: 10 mg via ORAL
  Filled 2014-01-02: qty 1

## 2014-01-02 MED ORDER — PROMETHAZINE HCL 25 MG/ML IJ SOLN: 6.2500 mg | INTRAMUSCULAR | Status: DC | PRN

## 2014-01-02 MED ORDER — FUROSEMIDE 40 MG PO TABS
40.0000 mg | ORAL_TABLET | Freq: Every morning | ORAL | Status: DC
  Administered 2014-01-02 – 2014-01-03 (×2): 40 mg via ORAL
  Filled 2014-01-02 (×2): qty 1

## 2014-01-02 MED ORDER — HYDROMORPHONE HCL 1 MG/ML IJ SOLN
INTRAMUSCULAR | Status: AC
  Filled 2014-01-02: qty 1

## 2014-01-02 MED ORDER — BUPIVACAINE-EPINEPHRINE (PF) 0.5% -1:200000 IJ SOLN
INTRAMUSCULAR | Status: AC
Start: 2014-01-02 — End: 2014-01-02
  Filled 2014-01-02: qty 30

## 2014-01-02 MED ORDER — EPHEDRINE SULFATE 50 MG/ML IJ SOLN
INTRAMUSCULAR | Status: DC | PRN
  Administered 2014-01-02: 10 mg via INTRAVENOUS
  Administered 2014-01-02: 5 mg via INTRAVENOUS

## 2014-01-02 MED ORDER — HYDROMORPHONE HCL 1 MG/ML IJ SOLN
0.5000 mg | INTRAMUSCULAR | Status: DC | PRN
  Administered 2014-01-02 – 2014-01-03 (×4): 1 mg via INTRAVENOUS
  Filled 2014-01-02 (×4): qty 1

## 2014-01-02 MED ORDER — HYDROMORPHONE HCL 2 MG PO TABS: 2.0000 mg | ORAL_TABLET | Freq: Four times a day (QID) | ORAL | Status: DC | PRN

## 2014-01-02 MED ORDER — ONDANSETRON HCL 4 MG/2ML IJ SOLN
INTRAMUSCULAR | Status: DC | PRN
  Administered 2014-01-02: 4 mg via INTRAVENOUS

## 2014-01-02 MED ORDER — LIDOCAINE HCL (CARDIAC) 20 MG/ML IV SOLN
INTRAVENOUS | Status: DC | PRN
  Administered 2014-01-02: 50 mg via INTRAVENOUS

## 2014-01-02 MED ORDER — SODIUM CHLORIDE 0.9 % IR SOLN
Status: AC
  Filled 2014-01-02: qty 1

## 2014-01-02 MED ORDER — PANTOPRAZOLE SODIUM 40 MG PO TBEC
40.0000 mg | DELAYED_RELEASE_TABLET | Freq: Every day | ORAL | Status: DC
  Administered 2014-01-02 – 2014-01-03 (×2): 40 mg via ORAL
  Filled 2014-01-02 (×2): qty 1

## 2014-01-02 MED ORDER — POLYMYXIN B SULFATE 500000 UNITS IJ SOLR
INTRAMUSCULAR | Status: DC | PRN
  Administered 2014-01-02: 500 mL

## 2014-01-02 MED ORDER — LACTATED RINGERS IV SOLN
INTRAVENOUS | Status: DC
  Administered 2014-01-02: 19:00:00 via INTRAVENOUS

## 2014-01-02 MED ORDER — MIDAZOLAM HCL 5 MG/5ML IJ SOLN
INTRAMUSCULAR | Status: DC | PRN
  Administered 2014-01-02: 2 mg via INTRAVENOUS

## 2014-01-02 MED ORDER — PHENYLEPHRINE 40 MCG/ML (10ML) SYRINGE FOR IV PUSH (FOR BLOOD PRESSURE SUPPORT)
PREFILLED_SYRINGE | INTRAVENOUS | Status: AC
  Filled 2014-01-02: qty 10

## 2014-01-02 MED ORDER — PHENOL 1.4 % MT LIQD: 1.0000 | OROMUCOSAL | Status: DC | PRN

## 2014-01-02 MED ORDER — MOMETASONE FURO-FORMOTEROL FUM 100-5 MCG/ACT IN AERO
2.0000 | INHALATION_SPRAY | Freq: Two times a day (BID) | RESPIRATORY_TRACT | Status: DC
  Administered 2014-01-02 – 2014-01-03 (×2): 2 via RESPIRATORY_TRACT
  Filled 2014-01-02: qty 8.8

## 2014-01-02 MED ORDER — BACITRACIN-NEOMYCIN-POLYMYXIN 400-5-5000 EX OINT
TOPICAL_OINTMENT | CUTANEOUS | Status: DC | PRN
  Administered 2014-01-02: 1 via TOPICAL

## 2014-01-02 MED ORDER — PRAVASTATIN SODIUM 40 MG PO TABS
40.0000 mg | ORAL_TABLET | Freq: Every day | ORAL | Status: DC
  Administered 2014-01-02: 40 mg via ORAL
  Filled 2014-01-02 (×2): qty 1

## 2014-01-02 MED ORDER — METHOCARBAMOL 500 MG PO TABS: 500.0000 mg | ORAL_TABLET | Freq: Four times a day (QID) | ORAL | Status: DC | PRN

## 2014-01-02 MED ORDER — MIDAZOLAM HCL 2 MG/2ML IJ SOLN
INTRAMUSCULAR | Status: AC
  Filled 2014-01-02: qty 2

## 2014-01-02 MED ORDER — LIDOCAINE HCL (CARDIAC) 20 MG/ML IV SOLN
INTRAVENOUS | Status: AC
  Filled 2014-01-02: qty 5

## 2014-01-02 MED ORDER — HYDROMORPHONE HCL 2 MG PO TABS
2.0000 mg | ORAL_TABLET | Freq: Three times a day (TID) | ORAL | Status: DC | PRN
Start: 2014-01-02 — End: 2014-01-03
  Administered 2014-01-02 – 2014-01-03 (×3): 2 mg via ORAL
  Filled 2014-01-02 (×3): qty 1

## 2014-01-02 MED ORDER — LACTATED RINGERS IV SOLN
INTRAVENOUS | Status: DC
  Administered 2014-01-02: 1000 mL via INTRAVENOUS
  Administered 2014-01-02 (×3): via INTRAVENOUS

## 2014-01-02 MED ORDER — KETOROLAC TROMETHAMINE 30 MG/ML IJ SOLN
INTRAMUSCULAR | Status: AC
  Filled 2014-01-02: qty 1

## 2014-01-02 MED ORDER — FENTANYL CITRATE 0.05 MG/ML IJ SOLN
INTRAMUSCULAR | Status: AC
  Filled 2014-01-02: qty 5

## 2014-01-02 MED ORDER — ALLOPURINOL 100 MG PO TABS
100.0000 mg | ORAL_TABLET | Freq: Every day | ORAL | Status: DC
  Administered 2014-01-02 – 2014-01-03 (×2): 100 mg via ORAL
  Filled 2014-01-02 (×2): qty 1

## 2014-01-02 MED ORDER — BUPIVACAINE LIPOSOME 1.3 % IJ SUSP
20.0000 mL | Freq: Once | INTRAMUSCULAR | Status: AC
  Administered 2014-01-02: 20 mL
  Filled 2014-01-02: qty 20

## 2014-01-02 MED ORDER — BUPIVACAINE-EPINEPHRINE 0.5% -1:200000 IJ SOLN
INTRAMUSCULAR | Status: DC | PRN
  Administered 2014-01-02: 20 mL

## 2014-01-02 MED ORDER — PROPOFOL 10 MG/ML IV BOLUS
INTRAVENOUS | Status: AC
  Filled 2014-01-02: qty 20

## 2014-01-02 MED ORDER — PROPOFOL 10 MG/ML IV BOLUS
INTRAVENOUS | Status: DC | PRN
  Administered 2014-01-02: 180 mg via INTRAVENOUS

## 2014-01-02 MED ORDER — HYDROMORPHONE HCL 1 MG/ML IJ SOLN
INTRAMUSCULAR | Status: AC
  Administered 2014-01-02: 1 mg via INTRAVENOUS
  Filled 2014-01-02: qty 1

## 2014-01-02 MED ORDER — FENTANYL CITRATE 0.05 MG/ML IJ SOLN
INTRAMUSCULAR | Status: DC | PRN
  Administered 2014-01-02 (×5): 50 ug via INTRAVENOUS

## 2014-01-02 MED ORDER — OXYCODONE HCL 5 MG PO TABS: 5.0000 mg | ORAL_TABLET | Freq: Once | ORAL | Status: DC | PRN

## 2014-01-02 MED ORDER — POLYETHYLENE GLYCOL 3350 17 G PO PACK: 17.0000 g | PACK | Freq: Every day | ORAL | Status: DC | PRN

## 2014-01-02 MED ORDER — GLYCOPYRROLATE 0.2 MG/ML IJ SOLN
INTRAMUSCULAR | Status: DC | PRN
  Administered 2014-01-02: 0.6 mg via INTRAVENOUS

## 2014-01-02 MED ORDER — PHENYLEPHRINE HCL 10 MG/ML IJ SOLN
INTRAMUSCULAR | Status: DC | PRN
  Administered 2014-01-02: 80 ug via INTRAVENOUS
  Administered 2014-01-02: 40 ug via INTRAVENOUS
  Administered 2014-01-02: 80 ug via INTRAVENOUS
  Administered 2014-01-02 (×2): 40 ug via INTRAVENOUS

## 2014-01-02 MED ORDER — METHOCARBAMOL 1000 MG/10ML IJ SOLN
500.0000 mg | Freq: Four times a day (QID) | INTRAVENOUS | Status: DC | PRN
  Administered 2014-01-02: 500 mg via INTRAVENOUS
  Filled 2014-01-02 (×2): qty 5

## 2014-01-02 MED ORDER — ONDANSETRON HCL 4 MG/2ML IJ SOLN
4.0000 mg | INTRAMUSCULAR | Status: DC | PRN
Start: 2014-01-02 — End: 2014-01-03

## 2014-01-02 MED ORDER — BACITRACIN-NEOMYCIN-POLYMYXIN 400-5-5000 EX OINT
TOPICAL_OINTMENT | CUTANEOUS | Status: AC
  Filled 2014-01-02: qty 1

## 2014-01-02 MED ORDER — HYDROMORPHONE HCL 1 MG/ML IJ SOLN
0.2500 mg | INTRAMUSCULAR | Status: DC | PRN
  Administered 2014-01-02 (×4): 0.5 mg via INTRAVENOUS

## 2014-01-02 MED ORDER — THROMBIN 5000 UNITS EX SOLR
CUTANEOUS | Status: AC
  Filled 2014-01-02: qty 10000

## 2014-01-02 MED ORDER — METHOCARBAMOL 500 MG PO TABS
500.0000 mg | ORAL_TABLET | Freq: Four times a day (QID) | ORAL | Status: DC | PRN
  Administered 2014-01-02 (×2): 500 mg via ORAL
  Filled 2014-01-02 (×2): qty 1

## 2014-01-02 MED ORDER — POTASSIUM CHLORIDE ER 10 MEQ PO TBCR
10.0000 meq | EXTENDED_RELEASE_TABLET | Freq: Every day | ORAL | Status: DC
  Administered 2014-01-02 – 2014-01-03 (×2): 10 meq via ORAL
  Filled 2014-01-02 (×2): qty 1

## 2014-01-02 MED ORDER — OXYCODONE HCL 5 MG/5ML PO SOLN: 5.0000 mg | Freq: Once | ORAL | Status: DC | PRN

## 2014-01-02 MED ORDER — MEPERIDINE HCL 50 MG/ML IJ SOLN
6.2500 mg | INTRAMUSCULAR | Status: DC | PRN
Start: 2014-01-02 — End: 2014-01-02

## 2014-01-02 MED ORDER — ROCURONIUM BROMIDE 100 MG/10ML IV SOLN
INTRAVENOUS | Status: DC | PRN
  Administered 2014-01-02: 25 mg via INTRAVENOUS
  Administered 2014-01-02: 5 mg via INTRAVENOUS

## 2014-01-02 MED ORDER — GELATIN ABSORBABLE MT POWD
OROMUCOSAL | Status: DC | PRN
  Administered 2014-01-02: 08:00:00 via TOPICAL

## 2014-01-02 SURGICAL SUPPLY — 39 items
BAG SPEC THK2 15X12 ZIP CLS (MISCELLANEOUS)
BAG ZIPLOCK 12X15 (MISCELLANEOUS) IMPLANT
CLEANER TIP ELECTROSURG 2X2 (MISCELLANEOUS) ×2 IMPLANT
DRAPE MICROSCOPE LEICA (MISCELLANEOUS) ×2 IMPLANT
DRAPE POUCH INSTRU U-SHP 10X18 (DRAPES) ×2 IMPLANT
DRAPE SURG 17X11 SM STRL (DRAPES) ×2 IMPLANT
DRSG ADAPTIC 3X8 NADH LF (GAUZE/BANDAGES/DRESSINGS) ×2 IMPLANT
DRSG PAD ABDOMINAL 8X10 ST (GAUZE/BANDAGES/DRESSINGS) ×2 IMPLANT
DURAPREP 26ML APPLICATOR (WOUND CARE) ×2 IMPLANT
ELECT BLADE TIP CTD 4 INCH (ELECTRODE) ×2 IMPLANT
ELECT REM PT RETURN 9FT ADLT (ELECTROSURGICAL) ×2
ELECTRODE REM PT RTRN 9FT ADLT (ELECTROSURGICAL) ×1 IMPLANT
GAUZE SPONGE 4X4 12PLY STRL (GAUZE/BANDAGES/DRESSINGS) ×2 IMPLANT
GLOVE BIO SURGEON STRL SZ8 (GLOVE) ×2 IMPLANT
GLOVE BIOGEL PI IND STRL 8 (GLOVE) ×1 IMPLANT
GLOVE BIOGEL PI IND STRL 8.5 (GLOVE) ×1 IMPLANT
GLOVE BIOGEL PI INDICATOR 8 (GLOVE) ×1
GLOVE BIOGEL PI INDICATOR 8.5 (GLOVE) ×1
GLOVE ECLIPSE 8.0 STRL XLNG CF (GLOVE) ×2 IMPLANT
GLOVE SURG SS PI 8.0 STRL IVOR (GLOVE) ×2 IMPLANT
GOWN STRL REUS W/TWL XL LVL3 (GOWN DISPOSABLE) ×6 IMPLANT
KIT BASIN OR (CUSTOM PROCEDURE TRAY) ×2 IMPLANT
KIT POSITIONING SURG ANDREWS (MISCELLANEOUS) ×2 IMPLANT
MANIFOLD NEPTUNE II (INSTRUMENTS) ×2 IMPLANT
NEEDLE SPNL 18GX3.5 QUINCKE PK (NEEDLE) ×4 IMPLANT
PACK LAMINECTOMY ORTHO (CUSTOM PROCEDURE TRAY) ×2 IMPLANT
PATTIES SURGICAL .5 X.5 (GAUZE/BANDAGES/DRESSINGS) IMPLANT
PATTIES SURGICAL .75X.75 (GAUZE/BANDAGES/DRESSINGS) ×2 IMPLANT
PATTIES SURGICAL 1X1 (DISPOSABLE) IMPLANT
SPONGE LAP 4X18 X RAY DECT (DISPOSABLE) ×4 IMPLANT
SPONGE SURGIFOAM ABS GEL 100 (HEMOSTASIS) ×2 IMPLANT
STAPLER VISISTAT 35W (STAPLE) ×2 IMPLANT
SUT VIC AB 0 CT1 27 (SUTURE)
SUT VIC AB 0 CT1 27XBRD ANTBC (SUTURE) IMPLANT
SUT VIC AB 1 CT1 27 (SUTURE) ×4
SUT VIC AB 1 CT1 27XBRD ANTBC (SUTURE) ×2 IMPLANT
SYR 20CC LL (SYRINGE) ×2 IMPLANT
TAPE CLOTH SURG 6X10 WHT LF (GAUZE/BANDAGES/DRESSINGS) ×2 IMPLANT
TOWEL OR 17X26 10 PK STRL BLUE (TOWEL DISPOSABLE) ×2 IMPLANT

## 2014-01-02 NOTE — Evaluation (Signed)
Physical Therapy Evaluation Patient Details Name: Kenneth Fuller MRN: [id number] DOB: [birth year] Today's Date: 01/02/2014   History of Present Illness  CENTRAL DECOMPRESSION LUMBAR LAMINECTOMY L3-L4, L4-L5 (N/A)  Clinical Impression  Patient tolerated very well. Has DME. Pt will benefit from PT to address problems listed.    Follow Up Recommendations Home health PT;Supervision/Assistance - 24 hour    Equipment Recommendations  Rolling walker with 5" wheels;3in1 (PT)    Recommendations for Other Services       Precautions / Restrictions Precautions Precautions: Back Precaution Comments: handout provided and reviewed.      Mobility  Bed Mobility Overal bed mobility: Needs Assistance Bed Mobility: Rolling;Sidelying to Sit Rolling: Min assist Sidelying to sit: Min assist       General bed mobility comments: cues for technique  Transfers Overall transfer level: Needs assistance Equipment used: Rolling walker (2 wheeled) Transfers: Sit to/from Stand Sit to Stand: Supervision;From elevated surface         General transfer comment: cues for technique, from bed and /bsc  Ambulation/Gait Ambulation/Gait assistance: Min assist Ambulation Distance (Feet): 50 Feet Assistive device: Rolling walker (2 wheeled) Gait Pattern/deviations: Step-through pattern     General Gait Details: cues for posture  Stairs            Wheelchair Mobility    Modified Rankin (Stroke Patients Only)       Balance                                             Pertinent Vitals/Pain Pain Assessment: 0-10 Pain Score: 4  Pain Descriptors / Indicators: Discomfort Pain Intervention(s): Patient requesting pain meds-RN notified;Repositioned    Home Living Family/patient expects to be discharged to:: Private residence Living Arrangements: Spouse/significant other Available Help at Discharge: Family Type of Home: House Home Access: Ramped entrance        Home Equipment: Environmental consultant - 4 wheels      Prior Function Level of Independence: Independent with assistive device(s)               Hand Dominance        Extremity/Trunk Assessment               Lower Extremity Assessment: RLE deficits/detail RLE Deficits / Details: dorsiflexion 3+       Communication   Communication: No difficulties  Cognition Arousal/Alertness: Awake/alert Behavior During Therapy: WFL for tasks assessed/performed Overall Cognitive Status: Within Functional Limits for tasks assessed                      General Comments      Exercises        Assessment/Plan    PT Assessment Patient needs continued PT services  PT Diagnosis Difficulty walking;Acute pain   PT Problem List Decreased activity tolerance;Decreased mobility;Decreased knowledge of precautions;Decreased safety awareness;Decreased knowledge of use of DME;Pain  PT Treatment Interventions DME instruction;Gait training;Functional mobility training;Therapeutic activities;Therapeutic exercise;Patient/family education   PT Goals (Current goals can be found in the Care Plan section) Acute Rehab PT Goals Patient Stated Goal: to go home PT Goal Formulation: With patient/family Time For Goal Achievement: 01/05/14 Potential to Achieve Goals: Good    Frequency Min 5X/week   Barriers to discharge        Co-evaluation  End of Session   Activity Tolerance: Patient tolerated treatment well Patient left: in chair;with call bell/phone within reach;with family/visitor present Nurse Communication: Mobility status    Functional Assessment Tool Used: clinical judgement Functional Limitation: Mobility: Walking and moving around Mobility: Walking and Moving Around Current Status ([id number]): At least 20 percent but less than 40 percent impaired, limited or restricted Mobility: Walking and Moving Around Goal Status [phone]): At least 1 percent but less than 20 percent  impaired, limited or restricted    Time: 8250-0370 PT Time Calculation (min) (ACUTE ONLY): 26 min   Charges:   PT Evaluation $Initial PT Evaluation Tier I: 1 Procedure PT Treatments $Gait Training: 8-22 mins $Self Care/Home Management: 8-22   PT G Codes:   Functional Assessment Tool Used: clinical judgement Functional Limitation: Mobility: Walking and moving around    [place] 01/02/2014, 5:49 PM

## 2014-01-02 NOTE — Plan of Care (Signed)
Problem: Phase I Progression Outcomes Goal: Incision/dressings dry and intact Outcome: Completed/Met Date Met:  01/02/14 Goal: Tubes/drains patent Outcome: Completed/Met Date Met:  01/02/14 iv

## 2014-01-02 NOTE — Interval H&P Note (Signed)
History and Physical Interval Note:  01/02/2014 7:30 AM  Kenneth Fuller  has presented today for surgery, with the diagnosis of SPINAL STENOSIS  The various methods of treatment have been discussed with the patient and family. After consideration of risks, benefits and other options for treatment, the patient has consented to  Procedure(s): CENTRAL DECOMPRESSION LUMBAR LAMINECTOMY L4-L5 (N/A) as a surgical intervention .  The patient's history has been reviewed, patient examined, no change in status, stable for surgery.  I have reviewed the patient's chart and labs.  Questions were answered to the patient's satisfaction.     Alayah Knouff A

## 2014-01-02 NOTE — Progress Notes (Signed)
PACU note----on arrival to PACU, pt waving arms around, moaning, unable to understand pt; attempting to reorient, pain med given by CRNA, pt given urinal in case of need to void

## 2014-01-02 NOTE — Progress Notes (Signed)
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 Lissa Hoard to medicate for pain and continue to attempt to reorient; have been unable to assess back dressing due to pt waving arms, attempting to get out of bed, and general condition

## 2014-01-02 NOTE — H&P (Signed)
Kenneth Fuller is an 68 y.o. male.   Chief Complaint: Back and Right Leg Pain HPI: Patient developed a foot drop and severe pain in his Right Leg.Lumbar Myelogram showed severe constriction at L-4-L-4.   Past Medical History  Diagnosis Date  . MRSA cellulitis   . Diabetes mellitus   . Hypogonadism male   . Obesity   . Hypertension   . Hyperlipidemia   . GI bleeding   . Fibromyalgia   . Gout   . CAD (coronary artery disease)   . Neuropathy   . Sleep apnea     cpap- 14   . Shortness of breath dyspnea     with exertion   . GERD (gastroesophageal reflux disease)     Past Surgical History  Procedure Laterality Date  . Neck fusion    . Back surgery    . Colonoscopy    . Cardiac catheterization      Family History  Problem Relation Age of Onset  . Colon cancer Mother   . Diabetes Father     siblings  . Heart disease Father     brother  . Kidney disease Sister    Social History:  reports that he quit smoking about 44 years ago. He has quit using smokeless tobacco. He reports that he does not drink alcohol or use illicit drugs.  Allergies:  Allergies  Allergen Reactions  . Amlodipine Besy-Benazepril Hcl     Makes tongue swell  . Phenergan [Promethazine Hcl]     "I can't remember."  . Hydrocodone Itching    Medications Prior to Admission  Medication Sig Dispense Refill  . amLODipine (NORVASC) 10 MG tablet Take 0.5 tablets (5 mg total) by mouth daily. (Patient taking differently: Take 5 mg by mouth every morning. ) 90 tablet 1  . Blood Glucose Monitoring Suppl (PRODIGY BLOOD GLUCOSE MONITOR) W/DEVICE KIT 1 Units by Does not apply route daily. Check blood sugars 1X per day-- dx 250.02 1 each 0  . Cholecalciferol (VITAMIN D PO) Take 1 tablet by mouth every morning.    . Fluticasone-Salmeterol (ADVAIR) 100-50 MCG/DOSE AEPB Inhale 1 puff into the lungs 2 (two) times daily. 1 each 1  . furosemide (LASIX) 40 MG tablet Take 1 tablet (40 mg total) by mouth 2 (two) times  daily. (Patient taking differently: Take 40 mg by mouth every morning. ) 180 tablet 0  . glucose blood test strip Test 1X per day and prn- Dx Code E11.9. One Touch Ultra Test Strips (Patient taking differently: 1 each by Other route daily. Test 1X per day and prn- Dx Code E11.9. One Touch Ultra Test Strips) 100 each 2  . HYDROmorphone (DILAUDID) 2 MG tablet Take 2 mg by mouth every 8 (eight) hours as needed for moderate pain.     . metFORMIN (GLUCOPHAGE) 1000 MG tablet Take 1 tablet (1,000 mg total) by mouth 2 (two) times daily with a meal. 180 tablet 3  . metoprolol (LOPRESSOR) 50 MG tablet 2 PO qAM and 1 PO qhs (Patient taking differently: Take 50 mg by mouth at bedtime. ) 270 tablet 2  . omeprazole (PRILOSEC) 40 MG capsule Take 1 capsule (40 mg total) by mouth daily. (Patient taking differently: Take 40 mg by mouth every morning. As needed) 90 capsule 0  . ONETOUCH DELICA LANCETS 95M MISC 1 each by Does not apply route daily. Test 1X per day and prn - DX 250.02 (Patient taking differently: 1 each by Other route daily. Test 1X per  day and prn - DX 250.02) 100 each 11  . potassium chloride (KLOR-CON 10) 10 MEQ tablet Take 1 tablet (10 mEq total) by mouth daily. (Patient taking differently: Take 10 mEq by mouth every morning. ) 90 tablet 0  . pravastatin (PRAVACHOL) 40 MG tablet Take 1 tablet (40 mg total) by mouth daily. (Patient taking differently: Take 40 mg by mouth at bedtime. ) 30 tablet 0  . Propylene Glycol (SYSTANE BALANCE OP) Place 1 drop into both eyes daily as needed (dry eyes.).    [name] allopurinol (ZYLOPRIM) 100 MG tablet 100 mg daily.     . traMADol (ULTRAM) 50 MG tablet Take 50 mg by mouth every morning.    . traMADol (ULTRAM-ER) 100 MG 24 hr tablet       Results for orders placed or performed during the hospital encounter of 01/02/14 (from the past 48 hour(s))  Glucose, capillary     Status: Abnormal   Collection Time: 01/02/14  5:16 AM  Result Value Ref Range   Glucose-Capillary 135  (H) 70 - 99 mg/dL   Comment 1 Notify RN    No results found.  Review of Systems  Constitutional: Negative.   HENT: Negative.   Eyes: Negative.   Respiratory: Negative.   Cardiovascular: Negative.   Gastrointestinal: Negative.   Genitourinary: Negative.   Musculoskeletal: Positive for back pain.  Skin: Negative.   Neurological: Positive for focal weakness.  Endo/Heme/Allergies: Negative.   Psychiatric/Behavioral: Negative.     Blood pressure 149/89, pulse 71, temperature 98.2 F (36.8 C), temperature source Oral, resp. rate 18, height _0  (1.702 m), weight 125.646 kg (277 lb), SpO2 97 %. Physical Exam  Constitutional: He appears well-developed.  HENT:  Head: Normocephalic.  Eyes: Pupils are equal, round, and reactive to light.  Neck: Normal range of motion.  Cardiovascular: Normal rate.   Respiratory: Effort normal.  GI: Soft.  Musculoskeletal:  Foot Drop on the Right.  Skin: Skin is warm.     Assessment/Plan Complete Decompression at L-4-L-5 for Spinal Stenosis.  Kenneth Fuller A 01/02/2014, 7:23 AM

## 2014-01-02 NOTE — Progress Notes (Signed)
PACU note----pt continues to wave arms around, attempts to get out of bed; moaning and speaking and unable to understand speech; speech is uncomprehensible; CRNA and Dr. Lissa Hoard and Dr. Gladstone Lighter in; aware of pt's status; continued attempts to reorient pt without success; CRNA to speak with pt's wife to ascertain pt's history----due to foreign speech????

## 2014-01-02 NOTE — Brief Op Note (Signed)
01/02/2014  9:30 AM  PATIENT:  [name] Sparling  68 y.o. male  PRE-OPERATIVE DIAGNOSIS:  SPINAL STENOSIS LUMBAR FOUR TO FIVE and L-3-L-4 and Foraminal Stenosis of L-4 and L-5 Nerve roots Bilaterally.Foot Drop on the Right. POST-OPERATIVE DIAGNOSIS:  Same as Pre-Op  PROCEDURE:  Procedure(s): CENTRAL DECOMPRESSION LUMBAR LAMINECTOMY L3-L4, L4-L5 (N/A) and Foraminotomies for L-4 and L-5 Bilaterally-TWO Levels.  SURGEON:  Surgeon(s) and Role:    * Tobi Bastos, MD - Primary    * Johnn Hai, MD - Assisting     ASSISTANTS: Susa Day MD  ANESTHESIA:   general  EBL:  Total I/O In: 2000 [I.V.:2000] Out: -   BLOOD ADMINISTERED:none  DRAINS: none   LOCAL MEDICATIONS USED:  MARCAINE 20cc of 0.50% with Epinephrine at start of case and Exparel 20cc at end of Case.     SPECIMEN:  No Specimen  DISPOSITION OF SPECIMEN:  N/A  COUNTS:  YES  TOURNIQUET:  * No tourniquets in log *  DICTATION: .Other Dictation: Dictation Number [id number]  PLAN OF CARE: Admit for overnight observation  PATIENT DISPOSITION:  PACU - hemodynamically stable.   Delay start of Pharmacological VTE agent (>24hrs) due to surgical blood loss or risk of bleeding: yes

## 2014-01-02 NOTE — Anesthesia Postprocedure Evaluation (Signed)
Anesthesia Post Note  Patient: Kenneth Fuller  Procedure(s) Performed: Procedure(s) (LRB): CENTRAL DECOMPRESSION LUMBAR LAMINECTOMY L3-L4, L4-L5 (N/A)  Anesthesia type: General  Patient location: PACU  Post pain: Pain level controlled  Post assessment: Post-op Vital signs reviewed  Last Vitals: BP 104/52 mmHg  Pulse 74  Temp(Src) 36.4 C (Oral)  Resp 15  Ht _0  (1.702 m)  Wt 277 lb (125.646 kg)  BMI 43.37 kg/m2  SpO2 96%  Post vital signs: Reviewed  Level of consciousness: sedated  Complications: No apparent anesthesia complications

## 2014-01-02 NOTE — Plan of Care (Signed)
Problem: Phase I Progression Outcomes Goal: Pain controlled with appropriate interventions Outcome: Completed/Met Date Met:  01/02/14 Goal: OOB as tolerated unless otherwise ordered Outcome: Completed/Met Date Met:  01/02/14 Goal: Sutures/staples intact Outcome: Not Applicable Date Met:  38/93/73 Goal: Initial discharge plan identified Outcome: Completed/Met Date Met:  01/02/14 Goal: Voiding-avoid urinary catheter unless indicated Outcome: Completed/Met Date Met:  01/02/14 Goal: Vital signs/hemodynamically stable Outcome: Completed/Met Date Met:  01/02/14 Goal: Other Phase I Outcomes/Goals Outcome: Not Applicable Date Met:  42/87/68

## 2014-01-02 NOTE — Transfer of Care (Signed)
Immediate Anesthesia Transfer of Care Note  Patient: Kenneth Fuller  Procedure(s) Performed: Procedure(s): CENTRAL DECOMPRESSION LUMBAR LAMINECTOMY L3-L4, L4-L5 (N/A)  Patient Location: PACU  Anesthesia Type:General  Level of Consciousness: awake and alert   Airway & Oxygen Therapy: Patient Spontanous Breathing and Patient connected to face mask oxygen  Post-op Assessment: Report given to PACU RN and Post -op Vital signs reviewed and stable  Post vital signs: Reviewed and stable  Complications: No apparent anesthesia complications

## 2014-01-02 NOTE — Discharge Instructions (Signed)
Change your dressing daily. Shower only, no tub bath. For the first few days, remove your dressing, tape a piece of saran wrap over your incision, take your shower. After showering, remove the saran wrap and put a clean dressing on. After two days you can shower without the saran wrap.  Call if any temperatures greater than 101 or any wound complications: [phone] during the day and ask for Dr. Charlestine Night nurse, Brunilda Payor.

## 2014-01-02 NOTE — Progress Notes (Signed)
PACU note----- pt able to state he is in hospital, Dr. Gladstone Lighter is his doctor, and his back hurts; pain meds continued

## 2014-01-02 NOTE — Op Note (Signed)
NAMEMarland Kitchen  KATLIN, CISZEWSKI NO.:  [id number]  MEDICAL RECORD NO.:  [id number]  LOCATION:  [facility]                         FACILITY:  [facility]  PHYSICIAN:  Kipp Brood. Siris Hoos, M.D.DATE OF BIRTH:  [birth year]  DATE OF PROCEDURE:  01/02/2014 DATE OF DISCHARGE:                              OPERATIVE REPORT   SURGEON:  Kipp Brood. Gladstone Lighter, MD  ASSISTANT:  Susa Day, M.D.  PREOPERATIVE DIAGNOSES: 1. Spinal stenosis at L3-4. 2. Spinal stenosis at L4-5. 3. Foraminal stenosis for the L4 and L5 roots bilaterally. 4. Footdrop on the right.  POSTOPERATIVE DIAGNOSES: 1. Spinal stenosis at L3-4. 2. Spinal stenosis at L4-5. 3. Foraminal stenosis for the L4 and L5 roots bilaterally. 4. Footdrop on the right.  OPERATION: 1. Complete decompressive lumbar laminectomy at L3-4. 2. Complete decompressive lumbar laminectomy at L4-5. 3. Foraminotomies for the L4 and L5 roots bilaterally at 2 levels.  DESCRIPTION OF PROCEDURE:  Under general anesthesia, routine orthopedic prep and draping of the lower back was carried out.  The appropriate time-out was carried out.  I did not need to mark the back since we were going central.  At this time, after the time-out was carried out, two needles were placed in the back for localization purposes and x-ray was taken.  This was done after the sterile prep and draping.  Following that, an incision was made over L3-4 and L4-5, bleeders identified and cauterized.  Self-retaining retractors were inserted.  I then separated the muscle from the lamina and spinous processes bilaterally.  Kocher clamp was placed over the spinous processes.  Another x-ray was taken to further identify the space.  At this time, the [employer] [facility] [employer] retractors then were inserted.  We had a clear visibility with a microscope at this point, went down and removed the entire spinous process of L4, portion of L3 and portion of L5.  I then went down and did a complete  central decompression of L4-5 and extended the decompression proximally as well as distally.  We protected the dura at all times as we removed the ligamentum flavum.  Following that, we identified the dura as I mentioned, we now thoroughly decompressed the lateral recesses bilaterally and did decompress the foramina bilaterally.  When the procedure was complete, we then utilized a hockey-stick to go proximal, distal, and out each foramina to make sure we were clear.  We were totally clear now, there were no further constriction.  We thoroughly irrigated out the area, loosely applied some thrombin-soaked Gelfoam and closed the wound in layers in usual fashion except I left a small distal deep and proximal part of the wound open for drainage purposes.  Subcu was closed with #1 Vicryl, skin with metal staples. Now at the beginning of the case, I injected 20 mL of 0.5% Marcaine with epinephrine into the muscle to prevent bleeding.  At the end of the case, I utilized 20 mL of Exparel into the muscle and soft tissue. Sterile Neosporin dressings were applied.  At the beginning of the case, he had 2 g of IV Ancef.          ______________________________ Kipp Brood. Gladstone Lighter, M.D.     RAG/MEDQ  D:  01/02/2014  T:  01/02/2014  Job:  [id number]

## 2014-01-03 DIAGNOSIS — M4806 Spinal stenosis, lumbar region: Secondary | ICD-10-CM | POA: Diagnosis not present

## 2014-01-03 LAB — GLUCOSE, CAPILLARY
GLUCOSE-CAPILLARY: 153 mg/dL — AB (ref 70–99)
GLUCOSE-CAPILLARY: 124 mg/dL — AB (ref 70–99)

## 2014-01-03 MED ORDER — HYDROMORPHONE HCL 2 MG PO TABS
2.0000 mg | ORAL_TABLET | Freq: Four times a day (QID) | ORAL | Status: DC | PRN
  Administered 2014-01-03: 2 mg via ORAL
  Filled 2014-01-03: qty 1

## 2014-01-03 MED ORDER — HYDROMORPHONE HCL 2 MG PO TABS: 2.0000 mg | ORAL_TABLET | ORAL | Status: DC | PRN

## 2014-01-03 NOTE — Progress Notes (Signed)
Pt to d/c home. AVS reviewed and "My Chart" discussed with pt. Pt capable of verbalizing medications, dressing changes, signs and symptoms of infection, and follow-up appointments. Remains hemodynamically stable. No signs and symptoms of distress. Educated pt to return to ER in the case of SOB, dizziness, or chest pain.

## 2014-01-03 NOTE — Evaluation (Signed)
Occupational Therapy Evaluation Patient Details Name: Kenneth Fuller MRN: [id number] DOB: [birth year] Today's Date: 01/03/2014    History of Present Illness CENTRAL DECOMPRESSION LUMBAR LAMINECTOMY L3-L4, L4-L5 (N/A)   Clinical Impression   This 68 year old man was admitted for the above surgery. All education was completed. Pt does not need any further OT at this time.    Follow Up Recommendations  No OT follow up    Equipment Recommendations  None recommended by OT    Recommendations for Other Services       Precautions / Restrictions Precautions Precautions: Back Precaution Comments: handout provided and reviewed. Restrictions Weight Bearing Restrictions: No      Mobility Bed Mobility               General bed mobility comments: pt OOB; verbalizes understanding of log roll and technique to get into/out of bed  Transfers   Equipment used: Rolling walker (2 wheeled) Transfers: Sit to/from Stand Sit to Stand: Supervision         General transfer comment: pt following back precautions    Balance                                            ADL Overall ADL's : Needs assistance/impaired                         Toilet Transfer: Supervision/safety;Ambulation;Grab bars;Comfort height toilet   Toileting- Clothing Manipulation and Hygiene: Moderate assistance;Sit to/from stand         General ADL Comments: pt is able to complete UB adls with set up.  He needs min A for LB bathing with AE and also for LB.  Pt helped aunt with AE:  reviewed with him and also showed toilet aide, as he would benefit from this.  Demonstrated stepping backwards into shower stall; pt has accessible shower in his hospital room.  Reviewed ADLs and back precautions.  Pt verbalizes understanding of all.       Vision                     Perception     Praxis      Pertinent Vitals/Pain Pain Score: 3  Pain Descriptors / Indicators:  Aching;Sore (back) Pain Intervention(s): Limited activity within patient's tolerance;Monitored during session;Premedicated before session;Repositioned     Hand Dominance     Extremity/Trunk Assessment Upper Extremity Assessment Upper Extremity Assessment: Overall WFL for tasks assessed           Communication Communication Communication: No difficulties   Cognition Arousal/Alertness: Awake/alert Behavior During Therapy: WFL for tasks assessed/performed Overall Cognitive Status: Within Functional Limits for tasks assessed                     General Comments       Exercises       Shoulder Instructions      Home Living Family/patient expects to be discharged to:: Private residence Living Arrangements: Spouse/significant other Available Help at Discharge: Family Type of Home: House             Bathroom Shower/Tub: Walk-in shower   Bathroom Toilet: Handicapped height     Home Equipment: Bedside commode;Tub bench;Shower seat (reacher, sock aide)   Additional Comments: 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  They have DME, AE including long tub bench for tub.  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  OT Frequency:     Barriers to D/C:            Co-evaluation              End of Session    Activity Tolerance: Patient tolerated treatment well Patient left: in chair;with call bell/phone within reach   Time: 7505-1833 OT Time Calculation (min): 29 min Charges:  OT General Charges $OT Visit: 1 Procedure OT Evaluation $Initial OT Evaluation Tier I: 1 Procedure OT Treatments $Self  Care/Home Management : 8-22 mins G-Codes: OT G-codes **NOT FOR INPATIENT CLASS** Functional Assessment Tool Used: clinical judgment and observation Functional Limitation: Self care Self Care Current Status (P8251): At least 40 percent but less than 60 percent impaired, limited or restricted Self Care Goal Status (G9842): At least 40 percent but less than 60 percent impaired, limited or restricted Self Care Discharge Status [phone]): At least 40 percent but less than 60 percent impaired, limited or restricted  [facility] 01/03/2014, 9:56 AM Lesle Chris, OTR/L [phone] 01/03/2014

## 2014-01-03 NOTE — Progress Notes (Signed)
Physical Therapy Treatment Patient Details Name: Kenneth Fuller MRN: [id number] DOB: 05/25/1945 Today's Date: 01/03/2014    History of Present Illness CENTRAL DECOMPRESSION LUMBAR LAMINECTOMY L3-L4, L4-L5 (N/A)    PT Comments    Progressing well with mobility.   Follow Up Recommendations  Home health PT;Supervision/Assistance - 24 hour     Equipment Recommendations   (pt states he already has rollator and standard RW)    Recommendations for Other Services       Precautions / Restrictions Precautions Precautions: Back Precaution Comments: Reviewed back precautions-pt able to recall 2/3 Restrictions Weight Bearing Restrictions: No    Mobility  Bed Mobility               General bed mobility comments: pt OOB in recliner  Transfers Overall transfer level: Needs assistance Equipment used: Rolling walker (2 wheeled) Transfers: Sit to/from Stand Sit to Stand: Modified independent (Device/Increase time)         General transfer comment: pt following back precautions  Ambulation/Gait Ambulation/Gait assistance: Supervision Ambulation Distance (Feet): 100 Feet Assistive device: Rolling walker (2 wheeled) Gait Pattern/deviations: Step-through pattern     General Gait Details: VCs adherence to precautions.   Stairs            Wheelchair Mobility    Modified Rankin (Stroke Patients Only)       Balance                                    Cognition Arousal/Alertness: Awake/alert Behavior During Therapy: WFL for tasks assessed/performed Overall Cognitive Status: Within Functional Limits for tasks assessed                      Exercises      General Comments        Pertinent Vitals/Pain Pain Assessment: 0-10 Pain Score: 4  Pain Location: back Pain Descriptors / Indicators: Aching;Sore (back) Pain Intervention(s): Monitored during session    Home Living Family/patient expects to be discharged to:: Private  residence Living Arrangements: Spouse/significant other Available Help at Discharge: Family Type of Home: House       Home Equipment: Bedside commode;Tub bench;Shower seat (reacher, sock aide) Additional Comments: 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  They have DME, AE including long tub bench for tub.  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 towards PT goals: Progressing toward goals    Frequency  Min 5X/week    PT Plan Current plan remains appropriate    Co-evaluation             End of Session   Activity Tolerance: Patient tolerated treatment well Patient left: in chair;with call bell/phone within reach     Time: 0950-1002 PT Time Calculation (min) (ACUTE ONLY): 12 min  Charges:  $Gait Training: 8-22 mins                    G Codes:  Functional Assessment Tool Used: clinical judgement Functional Limitation: Mobility: Walking and moving around Mobility: Walking and Moving Around Current Status [phone]): At least 1 percent but less than  20 percent impaired, limited or restricted Mobility: Walking and Moving Around Goal Status [phone]): At least 1 percent but less than 20 percent impaired, limited or restricted Mobility: Walking and Moving Around Discharge Status [phone]): At least 1 percent but less than 20 percent impaired, limited or restricted   Weston Anna, MPT Pager: [phone]

## 2014-01-03 NOTE — Plan of Care (Signed)
Problem: Phase II Progression Outcomes Goal: Pain controlled Outcome: Progressing Goal: Progress activity as tolerated unless otherwise ordered Outcome: Completed/Met Date Met:  01/03/14 Goal: Progressing with IS, TCDB Outcome: Completed/Met Date Met:  01/03/14 Goal: Vital signs stable Outcome: Completed/Met Date Met:  01/03/14 Goal: Dressings dry/intact Outcome: Completed/Met Date Met:  01/03/14 Goal: Tolerating diet Outcome: Completed/Met Date Met:  01/03/14  Problem: Phase III Progression Outcomes Goal: Activity at appropriate level-compared to baseline (UP IN CHAIR FOR HEMODIALYSIS)  Outcome: Progressing Goal: Voiding independently Outcome: Completed/Met Date Met:  01/03/14

## 2014-01-03 NOTE — Progress Notes (Signed)
Subjective: 1 Day Post-Op Procedure(s) (LRB): CENTRAL DECOMPRESSION LUMBAR LAMINECTOMY L3-L4, L4-L5 (N/A) Patient reports pain as mild.   Patient seen in rounds with Dr. Wynelle Link. Patient is well, but has had some minor complaints of pain in the back, requiring pain medications Patient did get up and walk yesterday.  Will see how he does today and maybe home later today.  Will setup discharge  Objective: Vital signs in last 24 hours: Temp:  [97.5 F (36.4 C)-98 F (36.7 C)] 98 F (36.7 C) (11/26 0600) Pulse Rate:  [68-88] 78 (11/26 0600) Resp:  [14-22] 20 (11/26 0600) BP: (95-134)/(52-99) 134/77 mmHg (11/26 0600) SpO2:  [91 %-99 %] 91 % (11/26 0600)  Intake/Output from previous day:  Intake/Output Summary (Last 24 hours) at 01/03/14 0702 Last data filed at 01/03/14 0600  Gross per 24 hour  Intake 5208.33 ml  Output     75 ml  Net 5133.33 ml    Intake/Output this shift:    Labs: No results for input(s): HGB in the last 72 hours. No results for input(s): WBC, RBC, HCT, PLT in the last 72 hours. No results for input(s): NA, K, CL, CO2, BUN, CREATININE, GLUCOSE, CALCIUM in the last 72 hours. No results for input(s): LABPT, INR in the last 72 hours.  EXAM: General - Patient is Alert and Appropriate Extremity - Neurovascular intact Sensation intact distally HE IS ACTIVELY FLEXING THE RIGHT FOOT (foot drop preop) Dressing - clean, dry Motor Function - intact, moving foot and toes well on exam. Preop foot drop improved.  Active dorsiflexion to right foot  Assessment/Plan: 1 Day Post-Op Procedure(s) (LRB): CENTRAL DECOMPRESSION LUMBAR LAMINECTOMY L3-L4, L4-L5 (N/A) Procedure(s) (LRB): CENTRAL DECOMPRESSION LUMBAR LAMINECTOMY L3-L4, L4-L5 (N/A) Past Medical History  Diagnosis Date  . MRSA cellulitis   . Diabetes mellitus   . Hypogonadism male   . Obesity   . Hypertension   . Hyperlipidemia   . GI bleeding   . Fibromyalgia   . Gout   . CAD (coronary artery  disease)   . Neuropathy   . Sleep apnea     cpap- 14   . Shortness of breath dyspnea     with exertion   . GERD (gastroesophageal reflux disease)    Active Problems:   Spinal stenosis, lumbar region, with neurogenic claudication  Estimated body mass index is 43.37 kg/(m^2) as calculated from the following:   Height as of this encounter: 5' 7" (1.702 m).   Weight as of this encounter: 125.646 kg (277 lb). Up with therapy Discharge home with home health Diet - Cardiac diet and Diabetic diet Follow up - in 2 weeks Activity - up ad lib Disposition - Home Condition Upon Discharge - pending upon therapy today D/C Meds - See DC Summary  Arlee Muslim, PA-C Orthopaedic Surgery 01/03/2014, 7:02 AM

## 2014-01-03 NOTE — Progress Notes (Signed)
UR completed.

## 2014-01-04 ENCOUNTER — Encounter (HOSPITAL_COMMUNITY): Payer: Self-pay | Admitting: Orthopedic Surgery

## 2014-01-07 NOTE — Discharge Summary (Signed)
Physician Discharge Summary   Patient ID: Kenneth Fuller MRN: [id number] DOB/AGE: Apr 28, 1945 68 y.o.  Admit date: 01/02/2014 Discharge date: 01/03/2014  Primary Diagnosis: Lumbar spinal stenosis  Admission Diagnoses:  Past Medical History  Diagnosis Date  . MRSA cellulitis   . Diabetes mellitus   . Hypogonadism male   . Obesity   . Hypertension   . Hyperlipidemia   . GI bleeding   . Fibromyalgia   . Gout   . CAD (coronary artery disease)   . Neuropathy   . Sleep apnea     cpap- 14   . Shortness of breath dyspnea     with exertion   . GERD (gastroesophageal reflux disease)    Discharge Diagnoses:   Active Problems:   Spinal stenosis, lumbar region, with neurogenic claudication  Estimated body mass index is 43.37 kg/(m^2) as calculated from the following:   Height as of this encounter: _0  (1.702 m).   Weight as of this encounter: 125.646 kg (277 lb).  Procedure:  Procedure(s) (LRB): CENTRAL DECOMPRESSION LUMBAR LAMINECTOMY L3-L4, L4-L5 (N/A)   Consults: None  HPI: Patient developed a foot drop and severe pain in his Right Leg. Lumbar Myelogram showed severe constriction at L-4-L-4.   Laboratory Data: Admission on 01/02/2014, Discharged on 01/03/2014  Component Date Value Ref Range Status  . Glucose-Capillary 01/02/2014 135* 70 - 99 mg/dL Final  . Comment 1 01/02/2014 Notify RN   Final  . Glucose-Capillary 01/02/2014 132* 70 - 99 mg/dL Final  . Glucose-Capillary 01/02/2014 148* 70 - 99 mg/dL Final  . Comment 1 01/02/2014 Notify RN   Final  . Comment 2 01/02/2014 Documented in Chart   Final  . Glucose-Capillary 01/02/2014 226* 70 - 99 mg/dL Final  . Comment 1 01/02/2014 Documented in Chart   Final  . Glucose-Capillary 01/02/2014 114* 70 - 99 mg/dL Final  . Comment 1 01/02/2014 Documented in Chart   Final  . Comment 2 01/02/2014 Notify RN   Final  . Glucose-Capillary 01/03/2014 153* 70 - 99 mg/dL Final  . Glucose-Capillary 01/03/2014 124* 70 - 99 mg/dL  Final  Hospital Outpatient Visit on 12/26/2013  Component Date Value Ref Range Status  . Sodium 12/26/2013 135* 137 - 147 mEq/L Final  . Potassium 12/26/2013 4.4  3.7 - 5.3 mEq/L Final  . Chloride 12/26/2013 95* 96 - 112 mEq/L Final  . CO2 12/26/2013 29  19 - 32 mEq/L Final  . Glucose, Bld 12/26/2013 135* 70 - 99 mg/dL Final  . BUN 12/26/2013 17  6 - 23 mg/dL Final  . Creatinine, Ser 12/26/2013 0.90  0.50 - 1.35 mg/dL Final  . Calcium 12/26/2013 9.7  8.4 - 10.5 mg/dL Final  . Total Protein 12/26/2013 7.7  6.0 - 8.3 g/dL Final  . Albumin 12/26/2013 4.1  3.5 - 5.2 g/dL Final  . AST 12/26/2013 18  0 - 37 U/L Final  . ALT 12/26/2013 26  0 - 53 U/L Final  . Alkaline Phosphatase 12/26/2013 56  39 - 117 U/L Final  . Total Bilirubin 12/26/2013 0.4  0.3 - 1.2 mg/dL Final  . GFR calc non Af Amer 12/26/2013 86* >90 mL/min Final  . GFR calc Af Amer 12/26/2013 >90  >90 mL/min Final   Comment: (NOTE) The eGFR has been calculated using the CKD EPI equation. This calculation has not been validated in all clinical situations. eGFR's persistently <90 mL/min signify possible Chronic Kidney Disease.   . Anion gap 12/26/2013 11  5 - 15 Final  .  Prothrombin Time 12/26/2013 13.4  11.6 - 15.2 seconds Final  . INR 12/26/2013 1.01  0.00 - 1.49 Final  . Color, Urine 12/26/2013 YELLOW  YELLOW Final  . APPearance 12/26/2013 CLEAR  CLEAR Final  . Specific Gravity, Urine 12/26/2013 1.011  1.005 - 1.030 Final  . pH 12/26/2013 5.5  5.0 - 8.0 Final  . Glucose, UA 12/26/2013 NEGATIVE  NEGATIVE mg/dL Final  . Hgb urine dipstick 12/26/2013 NEGATIVE  NEGATIVE Final  . Bilirubin Urine 12/26/2013 NEGATIVE  NEGATIVE Final  . Ketones, ur 12/26/2013 NEGATIVE  NEGATIVE mg/dL Final  . Protein, ur 12/26/2013 NEGATIVE  NEGATIVE mg/dL Final  . Urobilinogen, UA 12/26/2013 0.2  0.0 - 1.0 mg/dL Final  . Nitrite 12/26/2013 NEGATIVE  NEGATIVE Final  . Leukocytes, UA 12/26/2013 NEGATIVE  NEGATIVE Final   MICROSCOPIC NOT DONE ON  URINES WITH NEGATIVE PROTEIN, BLOOD, LEUKOCYTES, NITRITE, OR GLUCOSE <1000 mg/dL.  . WBC 12/26/2013 10.3  4.0 - 10.5 K/uL Final  . RBC 12/26/2013 5.47  4.22 - 5.81 MIL/uL Final  . Hemoglobin 12/26/2013 15.1  13.0 - 17.0 g/dL Final  . HCT 12/26/2013 46.3  39.0 - 52.0 % Final  . MCV 12/26/2013 84.6  78.0 - 100.0 fL Final  . MCH 12/26/2013 27.6  26.0 - 34.0 pg Final  . MCHC 12/26/2013 32.6  30.0 - 36.0 g/dL Final  . RDW 12/26/2013 14.3  11.5 - 15.5 % Final  . Platelets 12/26/2013 268  150 - 400 K/uL Final  . MRSA, PCR 12/26/2013 NEGATIVE  NEGATIVE Final  . Staphylococcus aureus 12/26/2013 POSITIVE* NEGATIVE Final   Comment:        The Xpert SA Assay (FDA approved for NASAL specimens in patients over 55 years of age), is one component of a comprehensive surveillance program.  Test performance has been validated by [employer] for patients greater than or equal to 1 year old. It is not intended to diagnose infection nor to guide or monitor treatment.      X-Rays:Dg Chest 2 View  12/26/2013   CLINICAL DATA:  Preop for lumbar spinal stenosis surgery. History of hypertension and diabetes. Ex smoker.  EXAM: CHEST  2 VIEW  COMPARISON:  12/04/2013.  FINDINGS: Cardiac silhouette is normal in size and configuration. Normal mediastinal and hilar contours.  Clear lungs.  No pleural effusion or pneumothorax.  There are changes from previous low anterior cervical spine fusion, stable. Bony thorax is intact.  No change from the prior study.  IMPRESSION: No active cardiopulmonary disease.   Electronically Signed   By: Lajean Manes M.D.   On: 12/26/2013 13:30   Dg Lumbar Spine 2-3 Views  12/26/2013   CLINICAL DATA:  68 year old male under preoperative evaluation prior to surgery for lumbar spinal stenosis.  EXAM: LUMBAR SPINE - 2-3 VIEW  COMPARISON:  CT of the lumbar spine 11/27/2013.  FINDINGS: Three views of the lumbar spine demonstrate no acute displaced fracture or compression type fracture.  Alignment is anatomic. Severe multilevel degenerative disc disease is noted, most pronounced at L4-L5. Severe multilevel facet arthropathy, most severe at L4-L5 and L5-S1.  IMPRESSION: 1. Severe multilevel degenerative disc disease and lumbar spondylosis, as above.   Electronically Signed   By: Vinnie Langton M.D.   On: 12/26/2013 13:31   Dg Spine Portable 1 View  01/02/2014   CLINICAL DATA:  L4-5 decompression  EXAM: PORTABLE SPINE - 1 VIEW  COMPARISON:  Film from earlier in the same day  FINDINGS: Numbering nomenclature is similar to that used  on the prior exams. Surgical instruments are now noted within the spinal canal at the L4 and L5 levels. Correlation with the operative findings is recommended.  IMPRESSION: Intraoperative localization with surgical instruments at L4 and L5 as described.   Electronically Signed   By: Inez Catalina M.D.   On: 01/02/2014 09:15   Dg Spine Portable 1 View  01/02/2014   CLINICAL DATA:  L4-5 decompression  EXAM: PORTABLE SPINE - 1 VIEW  COMPARISON:  Film from earlier in the same day  FINDINGS: The numbering nomenclature similar to that utilized on the initial image. A surgical instrument is now noted adjacent to the spinous process of L5.  IMPRESSION: Surgical instrument adjacent to the spinous process of L5 for lumbar localization.   Electronically Signed   By: Inez Catalina M.D.   On: 01/02/2014 08:34   Dg Spine Portable 1 View  01/02/2014   CLINICAL DATA:  L4-5 decompression  EXAM: PORTABLE SPINE - 1 VIEW  COMPARISON:  08/28/2013  FINDINGS: Lateral radiograph of the lumbar spine reveals multilevel degenerative change. Surgical instruments are noted in the posterior soft tissues at the level of the L4-5 and L5-S1 interspace. The numbering nomenclature similar to that utilized on the prior MRI examination.  IMPRESSION: Intraoperative lumbar localization   Electronically Signed   By: Inez Catalina M.D.   On: 01/02/2014 08:22    EKG: Orders placed or performed during  the hospital encounter of 12/26/13  . EKG  . EKG     Hospital Course: Kenneth Fuller is a 68 y.o. who was admitted to [facility]. They were brought to the operating room on 01/02/2014 and underwent Procedure(s): CENTRAL DECOMPRESSION LUMBAR LAMINECTOMY L3-L4, L4-L5.  Patient tolerated the procedure well and was later transferred to the recovery room and then to the orthopaedic floor for postoperative care.  They were given PO and IV analgesics for pain control following their surgery.  They were given 24 hours of postoperative antibiotics of  Anti-infectives    Start     Dose/Rate Route Frequency Ordered Stop   01/02/14 1600  ceFAZolin (ANCEF) IVPB 1 g/50 mL premix     1 g100 mL/hr over 30 Minutes Intravenous 3 times per day 01/02/14 1328 01/03/14 0640   01/02/14 0820  polymyxin B 500,000 Units, bacitracin 50,000 Units in sodium chloride irrigation 0.9 % 500 mL irrigation  Status:  Discontinued       As needed 01/02/14 0820 01/02/14 0939   01/02/14 0600  ceFAZolin (ANCEF) 3 g in dextrose 5 % 50 mL IVPB     3 g160 mL/hr over 30 Minutes Intravenous On call to O.R. 01/01/14 1510 01/02/14 0751     and started on DVT prophylaxis in the form of None.   PT was ordered.  Discharge planning consulted to help with postop disposition and equipment needs.  Patient had a fair night on the evening of surgery.  They started to get up OOB with therapy on day one.  Patient was seen in rounds and was ready to go home.   Diet: Diabetic diet Activity:WBAT Follow-up:in 2 weeks Disposition - Home Discharged Condition: stable   Discharge Instructions    Call MD / Call 911    Complete by:  As directed   If you experience chest pain or shortness of breath, CALL 911 and be transported to the hospital emergency room.  If you develope a fever above 101 F, pus (white drainage) or increased drainage or redness at the wound,  or calf pain, call your surgeon's office.     Constipation Prevention     Complete by:  As directed   Drink plenty of fluids.  Prune juice may be helpful.  You may use a stool softener, such as Colace (over the counter) 100 mg twice a day.  Use MiraLax (over the counter) for constipation as needed.     Diet Carb Modified    Complete by:  As directed      Discharge instructions    Complete by:  As directed   Change your dressing daily. Shower only, no tub bath. For the first few days, remove your dressing, tape a piece of saran wrap over your incision, take your shower. After showering, remove the saran wrap and put a clean dressing on. After two days you can shower without the saran wrap.  Call if any temperatures greater than 101 or any wound complications: [phone] during the day and ask for Dr. Charlestine Night nurse, Brunilda Payor.     Driving restrictions    Complete by:  As directed   No driving while taking pain medications     Increase activity slowly as tolerated    Complete by:  As directed      Lifting restrictions    Complete by:  As directed   No lifting            Medication List    STOP taking these medications        traMADol 100 MG 24 hr tablet  Commonly known as:  ULTRAM-ER     traMADol 50 MG tablet  Commonly known as:  ULTRAM      TAKE these medications        allopurinol 100 MG tablet  Commonly known as:  ZYLOPRIM  100 mg daily.     amLODipine 10 MG tablet  Commonly known as:  NORVASC  Take 0.5 tablets (5 mg total) by mouth daily.     Fluticasone-Salmeterol 100-50 MCG/DOSE Aepb  Commonly known as:  ADVAIR  Inhale 1 puff into the lungs 2 (two) times daily.     furosemide 40 MG tablet  Commonly known as:  LASIX  Take 1 tablet (40 mg total) by mouth 2 (two) times daily.     glucose blood test strip  Test 1X per day and prn- Dx Code E11.9. One Touch Ultra Test Strips     HYDROmorphone 2 MG tablet  Commonly known as:  DILAUDID  Take 1 tablet (2 mg total) by mouth every 6 (six) hours as needed for moderate pain.      HYDROmorphone 2 MG tablet  Commonly known as:  DILAUDID  Take 1 tablet (2 mg total) by mouth every 4 (four) hours as needed for moderate pain.     metFORMIN 1000 MG tablet  Commonly known as:  GLUCOPHAGE  Take 1 tablet (1,000 mg total) by mouth 2 (two) times daily with a meal.     methocarbamol 500 MG tablet  Commonly known as:  ROBAXIN  Take 1 tablet (500 mg total) by mouth every 6 (six) hours as needed for muscle spasms.     metoprolol 50 MG tablet  Commonly known as:  LOPRESSOR  2 PO qAM and 1 PO qhs     omeprazole 40 MG capsule  Commonly known as:  PRILOSEC  Take 1 capsule (40 mg total) by mouth daily.     ONETOUCH DELICA LANCETS 45W Misc  1 each by Does not apply route daily. Test  1X per day and prn - DX 250.02     potassium chloride 10 MEQ tablet  Commonly known as:  KLOR-CON 10  Take 1 tablet (10 mEq total) by mouth daily.     pravastatin 40 MG tablet  Commonly known as:  PRAVACHOL  Take 1 tablet (40 mg total) by mouth daily.     PRODIGY BLOOD GLUCOSE MONITOR W/DEVICE Kit  1 Units by Does not apply route daily. Check blood sugars 1X per day-- dx 250.02     SYSTANE BALANCE OP  Place 1 drop into both eyes daily as needed (dry eyes.).     VITAMIN D PO  Take 1 tablet by mouth every morning.           Follow-up Information    Follow up with GIOFFRE,RONALD A, MD. Schedule an appointment as soon as possible for a visit in 2 weeks.   Specialty:  Orthopedic Surgery   Contact information:   [address] [place] [address] [phone]       Signed: Ardeen Jourdain, PA-C Orthopaedic Surgery 01/07/2014, 8:58 AM

## 2014-01-19 ENCOUNTER — Other Ambulatory Visit: Payer: Self-pay | Admitting: Family Medicine

## 2014-01-21 ENCOUNTER — Ambulatory Visit: Payer: Medicare Other | Admitting: Nurse Practitioner

## 2014-01-21 NOTE — Telephone Encounter (Signed)
Pravastatin not on med list

## 2014-01-24 ENCOUNTER — Ambulatory Visit (INDEPENDENT_AMBULATORY_CARE_PROVIDER_SITE_OTHER): Payer: Medicare Other | Admitting: Nurse Practitioner

## 2014-01-24 ENCOUNTER — Encounter: Payer: Self-pay | Admitting: Nurse Practitioner

## 2014-01-24 VITALS — BP 137/90 | HR 80 | Temp 99.2°F | Ht 67.0 in | Wt 275.4 lb

## 2014-01-24 DIAGNOSIS — E119 Type 2 diabetes mellitus without complications: Secondary | ICD-10-CM

## 2014-01-24 DIAGNOSIS — E876 Hypokalemia: Secondary | ICD-10-CM

## 2014-01-24 DIAGNOSIS — K219 Gastro-esophageal reflux disease without esophagitis: Secondary | ICD-10-CM

## 2014-01-24 DIAGNOSIS — M109 Gout, unspecified: Secondary | ICD-10-CM | POA: Diagnosis not present

## 2014-01-24 DIAGNOSIS — I10 Essential (primary) hypertension: Secondary | ICD-10-CM

## 2014-01-24 DIAGNOSIS — J4520 Mild intermittent asthma, uncomplicated: Secondary | ICD-10-CM | POA: Diagnosis not present

## 2014-01-24 DIAGNOSIS — E785 Hyperlipidemia, unspecified: Secondary | ICD-10-CM

## 2014-01-24 DIAGNOSIS — F411 Generalized anxiety disorder: Secondary | ICD-10-CM

## 2014-01-24 LAB — POCT GLYCOSYLATED HEMOGLOBIN (HGB A1C): Hemoglobin A1C: 6.7

## 2014-01-24 NOTE — Progress Notes (Signed)
Subjective:    Patient ID: Kenneth Fuller, male    DOB: January 02, 1946, 68 y.o.   MRN: [id number]   Patient here today for follow up of chronic medical problems. Patient has had back surgery since last visit and says hat he is doing some better. STill recuperation but pain is better   Hypertension This is a recurrent problem. The problem has been waxing and waning since onset. Associated symptoms include anxiety and shortness of breath. Pertinent negatives include no chest pain, headaches or palpitations. Risk factors for coronary artery disease include dyslipidemia, male gender, obesity and sedentary lifestyle. Past treatments include calcium channel blockers and beta blockers. The current treatment provides moderate improvement. Compliance problems include diet and exercise.   Hyperlipidemia This is a chronic problem. The current episode started more than 1 year ago. The problem is controlled. Exacerbating diseases include diabetes and obesity. He has no history of hypothyroidism. Associated symptoms include shortness of breath. Pertinent negatives include no chest pain or myalgias. Current antihyperlipidemic treatment includes statins. The current treatment provides moderate improvement of lipids. Compliance problems include adherence to diet and adherence to exercise.  Risk factors for coronary artery disease include dyslipidemia, male sex and obesity.  Anxiety Symptoms include nausea and shortness of breath. Patient reports no chest pain, confusion, decreased concentration, dizziness or palpitations.   His past medical history is significant for asthma.  Asthma He complains of shortness of breath. There is no cough or wheezing. Pertinent negatives include no chest pain, headaches or myalgias. His past medical history is significant for asthma.  Diabetes He presents for his follow-up diabetic visit. He has type 2 diabetes mellitus. No MedicAlert identification noted. His disease course has been  fluctuating. Pertinent negatives for hypoglycemia include no confusion, dizziness or headaches. Pertinent negatives for diabetes include no chest pain, no polydipsia, no polyphagia, no polyuria and no weakness. Risk factors for coronary artery disease include dyslipidemia, family history, hypertension, male sex and obesity. Current diabetic treatment includes oral agent (monotherapy). He is compliant with treatment none of the time. His weight is stable. He has not had a previous visit with a dietitian. His breakfast blood glucose is taken between 9-10 am. His breakfast blood glucose range is generally 130-140 mg/dl. An ACE inhibitor/angiotensin II receptor blocker is not being taken. He does not see a podiatrist.Eye exam is not current.  Hypokalemia Occassional lower ext cramping. Gerd Controlled with omeprazole- No symptoms when takes meds. Peripheral edema Lasix 40 mg daily. Patient says if he misses a pill then he starts to swell  Review of Systems  Respiratory: Positive for shortness of breath. Negative for cough and wheezing.   Cardiovascular: Negative for chest pain and palpitations.  Gastrointestinal: Positive for nausea.  Endocrine: Negative for polydipsia, polyphagia and polyuria.  Musculoskeletal: Negative for myalgias.  Neurological: Negative for dizziness, weakness and headaches.  Psychiatric/Behavioral: Negative for confusion and decreased concentration.  All other systems reviewed and are negative.      Objective:   Physical Exam  Constitutional: He is oriented to person, place, and time. He appears well-developed and well-nourished.  HENT:  Head: Normocephalic.  Right Ear: External ear normal.  Left Ear: External ear normal.  Nose: Nose normal.  Mouth/Throat: Oropharynx is clear and moist.  Eyes: EOM are normal. Pupils are equal, round, and reactive to light.  Neck: Normal range of motion. Neck supple. No JVD present. No thyromegaly present.  Cardiovascular: Normal  rate, regular rhythm, normal heart sounds and intact distal pulses.  Exam  reveals no gallop and no friction rub.   No murmur heard. Pulmonary/Chest: Effort normal and breath sounds normal. No respiratory distress. He has no wheezes. He has no rales. He exhibits no tenderness.  Abdominal: Soft. Bowel sounds are normal. He exhibits no mass. There is no tenderness.  Genitourinary: Prostate normal and penis normal.  Musculoskeletal: Normal range of motion. He exhibits no edema.  Slow steady gait- s/p back surgery  Lymphadenopathy:    He has no cervical adenopathy.  Neurological: He is alert and oriented to person, place, and time. No cranial nerve deficit.  Skin: Skin is warm and dry.  Psychiatric: He has a normal mood and affect. His behavior is normal. Judgment and thought content normal.   BP 137/90 mmHg  Pulse 80  Temp(Src) 99.2 F (37.3 C) (Oral)  Ht _0  (1.702 m)  Wt 275 lb 6.4 oz (124.921 kg)  BMI 43.12 kg/m2    Results for orders placed or performed in visit on 01/24/14  POCT glycosylated hemoglobin (Hb A1C)  Result Value Ref Range   Hemoglobin A1C 6.7%      Assessment & Plan:   1. Hyperlipidemia with target LDL less than 100   2. Essential hypertension   3. Type 2 diabetes mellitus without complication   4. Asthma, mild intermittent, uncomplicated   5. Gastroesophageal reflux disease without esophagitis   6. Gout of right ankle, unspecified cause, unspecified chronicity   7. Anxiety state   8. Hypokalemia    Orders Placed This Encounter  Procedures  . CMP14+EGFR  . NMR, lipoprofile  . Arthritis Panel  . POCT glycosylated hemoglobin (Hb A1C)   Continue all meds Labs pending diet encouraged Health maintenance reviewed RTO in 3 months follow up  [place], FNP

## 2014-01-24 NOTE — Patient Instructions (Signed)
Health Maintenance A healthy lifestyle and preventative care can promote health and wellness.  Maintain regular health, dental, and eye exams.  Eat a healthy diet. Foods like vegetables, fruits, whole grains, low-fat dairy products, and lean protein foods contain the nutrients you need and are low in calories. Decrease your intake of foods high in solid fats, added sugars, and salt. Get information about a proper diet from your health care provider, if necessary.  Regular physical exercise is one of the most important things you can do for your health. Most adults should get at least 150 minutes of moderate-intensity exercise (any activity that increases your heart rate and causes you to sweat) each week. In addition, most adults need muscle-strengthening exercises on 2 or more days a week.   Maintain a healthy weight. The body mass index (BMI) is a screening tool to identify possible weight problems. It provides an estimate of body fat based on height and weight. Your health care provider can find your BMI and can help you achieve or maintain a healthy weight. For males 20 years and older:  A BMI below 18.5 is considered underweight.  A BMI of 18.5 to 24.9 is normal.  A BMI of 25 to 29.9 is considered overweight.  A BMI of 30 and above is considered obese.  Maintain normal blood lipids and cholesterol by exercising and minimizing your intake of saturated fat. Eat a balanced diet with plenty of fruits and vegetables. Blood tests for lipids and cholesterol should begin at age 24 and be repeated every 5 years. If your lipid or cholesterol levels are high, you are over age 84, or you are at high risk for heart disease, you may need your cholesterol levels checked more frequently.Ongoing high lipid and cholesterol levels should be treated with medicines if diet and exercise are not working.  If you smoke, find out from your health care provider how to quit. If you do not use tobacco, do not  start.  Lung cancer screening is recommended for adults aged 19-80 years who are at high risk for developing lung cancer because of a history of smoking. A yearly low-dose CT scan of the lungs is recommended for people who have at least a 30-pack-year history of smoking and are current smokers or have quit within the past 15 years. A pack year of smoking is smoking an average of 1 pack of cigarettes a day for 1 year (for example, a 30-pack-year history of smoking could mean smoking 1 pack a day for 30 years or 2 packs a day for 15 years). Yearly screening should continue until the smoker has stopped smoking for at least 15 years. Yearly screening should be stopped for people who develop a health problem that would prevent them from having lung cancer treatment.  If you choose to drink alcohol, do not have more than 2 drinks per day. One drink is considered to be 12 oz (360 mL) of beer, 5 oz (150 mL) of wine, or 1.5 oz (45 mL) of liquor.  Avoid the use of street drugs. Do not share needles with anyone. Ask for help if you need support or instructions about stopping the use of drugs.  High blood pressure causes heart disease and increases the risk of stroke. Blood pressure should be checked at least every 1-2 years. Ongoing high blood pressure should be treated with medicines if weight loss and exercise are not effective.  If you are 36-13 years old, ask your health care provider if  you should take aspirin to prevent heart disease.  Diabetes screening involves taking a blood sample to check your fasting blood sugar level. This should be done once every 3 years after age 50 if you are at a normal weight and without risk factors for diabetes. Testing should be considered at a younger age or be carried out more frequently if you are overweight and have at least 1 risk factor for diabetes.  Colorectal cancer can be detected and often prevented. Most routine colorectal cancer screening begins at the age of 43  and continues through age 76. However, your health care provider may recommend screening at an earlier age if you have risk factors for colon cancer. On a yearly basis, your health care provider may provide home test kits to check for hidden blood in the stool. A small camera at the end of a tube may be used to directly examine the colon (sigmoidoscopy or colonoscopy) to detect the earliest forms of colorectal cancer. Talk to your health care provider about this at age 17 when routine screening begins. A direct exam of the colon should be repeated every 5-10 years through age 28, unless early forms of precancerous polyps or small growths are found.  People who are at an increased risk for hepatitis B should be screened for this virus. You are considered at high risk for hepatitis B if:  You were born in a country where hepatitis B occurs often. Talk with your health care provider about which countries are considered high risk.  Your parents were born in a high-risk country and you have not received a shot to protect against hepatitis B (hepatitis B vaccine).  You have HIV or AIDS.  You use needles to inject street drugs.  You live with, or have sex with, someone who has hepatitis B.  You are a man who has sex with other men (MSM).  You get hemodialysis treatment.  You take certain medicines for conditions like cancer, organ transplantation, and autoimmune conditions.  Hepatitis C blood testing is recommended for all people born from [birth year] through 1964 and any individual with known risk factors for hepatitis C.  Healthy men should no longer receive prostate-specific antigen (PSA) blood tests as part of routine cancer screening. Talk to your health care provider about prostate cancer screening.  Testicular cancer screening is not recommended for adolescents or adult males who have no symptoms. Screening includes self-exam, a health care provider exam, and other screening tests. Consult with your  health care provider about any symptoms you have or any concerns you have about testicular cancer.  Practice safe sex. Use condoms and avoid high-risk sexual practices to reduce the spread of sexually transmitted infections (STIs).  You should be screened for STIs, including gonorrhea and chlamydia if:  You are sexually active and are younger than 24 years.  You are older than 24 years, and your health care provider tells you that you are at risk for this type of infection.  Your sexual activity has changed since you were last screened, and you are at an increased risk for chlamydia or gonorrhea. Ask your health care provider if you are at risk.  If you are at risk of being infected with HIV, it is recommended that you take a prescription medicine daily to prevent HIV infection. This is called pre-exposure prophylaxis (PrEP). You are considered at risk if:  You are a man who has sex with other men (MSM).  You are a heterosexual man who  is sexually active with multiple partners.  You take drugs by injection.  You are sexually active with a partner who has HIV.  Talk with your health care provider about whether you are at high risk of being infected with HIV. If you choose to begin PrEP, you should first be tested for HIV. You should then be tested every 3 months for as long as you are taking PrEP.  Use sunscreen. Apply sunscreen liberally and repeatedly throughout the day. You should seek shade when your shadow is shorter than you. Protect yourself by wearing long sleeves, pants, a wide-brimmed hat, and sunglasses year round whenever you are outdoors.  Tell your health care provider of new moles or changes in moles, especially if there is a change in shape or color. Also, tell your health care provider if a mole is larger than the size of a pencil eraser.  A one-time screening for abdominal aortic aneurysm (AAA) and surgical repair of large AAAs by ultrasound is recommended for men aged  45-75 years who are current or former smokers.  Stay current with your vaccines (immunizations). Document Released: 07/24/2007 Document Revised: 01/30/2013 Document Reviewed: 06/22/2010 [facility] Patient Information 2015 [place], [place]. This information is not intended to replace advice given to you by your health care provider. Make sure you discuss any questions you have with your health care provider.

## 2014-01-25 LAB — CMP14+EGFR
ALK PHOS: 58 IU/L (ref 39–117)
ALT: 28 IU/L (ref 0–44)
AST: 21 IU/L (ref 0–40)
Albumin/Globulin Ratio: 1.6 (ref 1.1–2.5)
Albumin: 4.2 g/dL (ref 3.6–4.8)
BUN/Creatinine Ratio: 11 (ref 10–22)
BUN: 9 mg/dL (ref 8–27)
CHLORIDE: 96 mmol/L — AB (ref 97–108)
CO2: 26 mmol/L (ref 18–29)
Calcium: 9.4 mg/dL (ref 8.6–10.2)
Creatinine, Ser: 0.8 mg/dL (ref 0.76–1.27)
GFR calc Af Amer: 107 mL/min/{1.73_m2} (ref >59)
GFR calc non Af Amer: 92 mL/min/{1.73_m2} (ref >59)
GLOBULIN, TOTAL: 2.6 g/dL (ref 1.5–4.5)
Glucose: 162 mg/dL — ABNORMAL HIGH (ref 65–99)
Potassium: 4.1 mmol/L (ref 3.5–5.2)
SODIUM: 139 mmol/L (ref 134–144)
Total Bilirubin: 0.4 mg/dL (ref 0.0–1.2)
Total Protein: 6.8 g/dL (ref 6.0–8.5)

## 2014-01-25 LAB — NMR, LIPOPROFILE
Cholesterol: 115 mg/dL (ref 100–199)
HDL Cholesterol by NMR: 38 mg/dL — ABNORMAL LOW (ref >39)
HDL PARTICLE NUMBER: 31.5 umol/L (ref >=30.5)
LDL PARTICLE NUMBER: 699 nmol/L (ref <1000)
LDL Size: 20.3 nm (ref >20.5)
LDL-C: 49 mg/dL (ref 0–99)
LP-IR SCORE: 63 — AB (ref <=45)
Small LDL Particle Number: 364 nmol/L (ref <=527)
Triglycerides by NMR: 141 mg/dL (ref 0–149)

## 2014-01-25 LAB — ARTHRITIS PANEL
BASOS ABS: 0.1 10*3/uL (ref 0.0–0.2)
Basos: 1 %
EOS ABS: 0.2 10*3/uL (ref 0.0–0.4)
EOS: 3 %
HCT: 43.1 % (ref 37.5–51.0)
Hemoglobin: 14.4 g/dL (ref 12.6–17.7)
IMMATURE GRANS (ABS): 0 10*3/uL (ref 0.0–0.1)
Immature Granulocytes: 0 %
Lymphocytes Absolute: 2.6 10*3/uL (ref 0.7–3.1)
Lymphs: 29 %
MCH: 27.5 pg (ref 26.6–33.0)
MCHC: 33.4 g/dL (ref 31.5–35.7)
MCV: 82 fL (ref 79–97)
MONOS ABS: 0.6 10*3/uL (ref 0.1–0.9)
Monocytes: 7 %
NEUTROS PCT: 60 %
Neutrophils Absolute: 5.5 10*3/uL (ref 1.4–7.0)
Platelets: 312 10*3/uL (ref 150–379)
RBC: 5.24 x10E6/uL (ref 4.14–5.80)
RDW: 14.9 % (ref 12.3–15.4)
Rhuematoid fact SerPl-aCnc: 7 IU/mL (ref 0.0–13.9)
Sed Rate: 15 mm/hr (ref 0–30)
URIC ACID: 7.3 mg/dL (ref 3.7–8.6)
WBC: 9 10*3/uL (ref 3.4–10.8)

## 2014-01-28 ENCOUNTER — Telehealth: Payer: Self-pay | Admitting: Nurse Practitioner

## 2014-01-28 NOTE — Telephone Encounter (Signed)
-----  Message from [facility] [place] sent at 01/25/2014  5:03 PM EST ----- Hgba1c discussed at appointment Kidney and liver function stable Cholesterol looks great Arthritis panel normal- uric acid level normal Continue current meds- low fat diet and exercise and recheck in 3 months

## 2014-01-28 NOTE — Telephone Encounter (Signed)
Patient aware

## 2014-03-06 ENCOUNTER — Other Ambulatory Visit: Payer: Self-pay | Admitting: Family Medicine

## 2014-03-18 ENCOUNTER — Other Ambulatory Visit: Payer: Self-pay | Admitting: Family Medicine

## 2014-03-29 ENCOUNTER — Other Ambulatory Visit: Payer: Self-pay | Admitting: Family Medicine

## 2014-04-05 ENCOUNTER — Ambulatory Visit (INDEPENDENT_AMBULATORY_CARE_PROVIDER_SITE_OTHER): Payer: Medicare Other | Admitting: Nurse Practitioner

## 2014-04-05 ENCOUNTER — Encounter: Payer: Self-pay | Admitting: Nurse Practitioner

## 2014-04-05 VITALS — BP 145/88 | HR 88 | Temp 98.9°F | Ht 67.0 in | Wt 281.0 lb

## 2014-04-05 DIAGNOSIS — J0101 Acute recurrent maxillary sinusitis: Secondary | ICD-10-CM

## 2014-04-05 DIAGNOSIS — J209 Acute bronchitis, unspecified: Secondary | ICD-10-CM

## 2014-04-05 MED ORDER — BENZONATATE 100 MG PO CAPS: 100.0000 mg | ORAL_CAPSULE | Freq: Three times a day (TID) | ORAL | Status: DC | PRN

## 2014-04-05 MED ORDER — LEVOFLOXACIN 500 MG PO TABS: 500.0000 mg | ORAL_TABLET | Freq: Every day | ORAL | Status: DC

## 2014-04-05 NOTE — Progress Notes (Signed)
  Subjective:     Kenneth Fuller is a 69 y.o. male who presents for evaluation of dyspnea, nasal congestion, productive cough and sore throat. Symptoms began 1 week ago. Symptoms have been gradually worsening since that time. Past history is significant for occasional episodes of bronchitis.  The following portions of the patient's history were reviewed and updated as appropriate: allergies, current medications, past family history, past medical history, past social history, past surgical history and problem list.  Review of Systems Pertinent items are noted in HPI.    Objective:    BP 145/88 mmHg  Pulse 88  Temp(Src) 98.9 F (37.2 C) (Oral)  Ht 5' 7" (1.702 m)  Wt 281 lb (127.461 kg)  BMI 44.00 kg/m2  SpO2 94% General appearance: alert and cooperative Head: Normocephalic, without obvious abnormality, atraumatic Eyes: conjunctivae/corneas clear. PERRL, EOM's intact. Fundi benign. Ears: normal TM's and external ear canals both ears Nose: copious discharge, moderate congestion, turbinates red, sinus tenderness bilateral Throat: lips, mucosa, and tongue normal; teeth and gums normal Lungs: rhonchi bibasilar Heart: regular rate and rhythm, S1, S2 normal, no murmur, click, rub or gallop    Assessment:    Acute Bronchitis and Sinusitis    Plan:  1. Take meds as prescribed 2. Use a cool mist humidifier especially during the winter months and when heat has been humid. 3. Use saline nose sprays frequently 4. Saline irrigations of the nose can be very helpful if done frequently.  * 4X daily for 1 week*  * Use of a nettie pot can be helpful with this. Follow directions with this* 5. Drink plenty of fluids 6. Keep thermostat turn down low 7.For any cough or congestion  Use plain Mucinex- regular strength or max strength is fine   * Children- consult with Pharmacist for dosing 8. For fever or aces or pains- take tylenol or ibuprofen appropriate for age and weight.  * for fevers  greater than 101 orally you may alternate ibuprofen and tylenol every  3 hours. Meds ordered this encounter  Medications  . levofloxacin (LEVAQUIN) 500 MG tablet    Sig: Take 1 tablet (500 mg total) by mouth daily.    Dispense:  7 tablet    Refill:  0    Order Specific Question:  Supervising Provider    Answer:  Chipper Herb [[id number]]  . benzonatate (TESSALON PERLES) 100 MG capsule    Sig: Take 1 capsule (100 mg total) by mouth 3 (three) times daily as needed for cough.    Dispense:  20 capsule    Refill:  0    Order Specific Question:  Supervising Provider    Answer:  Chipper Herb [place], FNP

## 2014-04-05 NOTE — Patient Instructions (Signed)
1. Take meds as prescribed 2. Use a cool mist humidifier especially during the winter months and when heat has been humid. 3. Use saline nose sprays frequently 4. Saline irrigations of the nose can be very helpful if done frequently.  * 4X daily for 1 week*  * Use of a nettie pot can be helpful with this. Follow directions with this* 5. Drink plenty of fluids 6. Keep thermostat turn down low 7.For any cough or congestion  Use plain Mucinex- regular strength or max strength is fine   * Children- consult with Pharmacist for dosing 8. For fever or aces or pains- take tylenol or ibuprofen appropriate for age and weight.  * for fevers greater than 101 orally you may alternate ibuprofen and tylenol every  3 hours.

## 2014-04-08 ENCOUNTER — Other Ambulatory Visit: Payer: Self-pay | Admitting: Family Medicine

## 2014-04-15 ENCOUNTER — Telehealth: Payer: Self-pay | Admitting: Nurse Practitioner

## 2014-04-15 NOTE — Telephone Encounter (Signed)
Stp advised of provider feedback.

## 2014-04-15 NOTE — Telephone Encounter (Signed)
Cough will last at least 2 weeks just needs to continue cough meds

## 2014-07-12 ENCOUNTER — Other Ambulatory Visit: Payer: Self-pay | Admitting: Family Medicine

## 2014-07-31 ENCOUNTER — Other Ambulatory Visit: Payer: Self-pay | Admitting: Nurse Practitioner

## 2014-08-01 ENCOUNTER — Other Ambulatory Visit: Payer: Self-pay

## 2014-08-01 ENCOUNTER — Telehealth: Payer: Self-pay | Admitting: Nurse Practitioner

## 2014-08-01 MED ORDER — POTASSIUM CHLORIDE CRYS ER 10 MEQ PO TBCR: EXTENDED_RELEASE_TABLET | ORAL | Status: DC

## 2014-10-04 ENCOUNTER — Other Ambulatory Visit: Payer: Self-pay | Admitting: Nurse Practitioner

## 2014-10-04 ENCOUNTER — Other Ambulatory Visit: Payer: Self-pay | Admitting: Family Medicine

## 2014-10-11 LAB — HM DIABETES EYE EXAM

## 2014-10-15 ENCOUNTER — Ambulatory Visit (INDEPENDENT_AMBULATORY_CARE_PROVIDER_SITE_OTHER): Payer: Medicare Other | Admitting: Nurse Practitioner

## 2014-10-15 ENCOUNTER — Encounter: Payer: Self-pay | Admitting: Nurse Practitioner

## 2014-10-15 VITALS — BP 130/86 | HR 67 | Temp 98.5°F | Ht 67.0 in | Wt 279.0 lb

## 2014-10-15 DIAGNOSIS — K219 Gastro-esophageal reflux disease without esophagitis: Secondary | ICD-10-CM

## 2014-10-15 DIAGNOSIS — J4520 Mild intermittent asthma, uncomplicated: Secondary | ICD-10-CM

## 2014-10-15 DIAGNOSIS — I10 Essential (primary) hypertension: Secondary | ICD-10-CM

## 2014-10-15 DIAGNOSIS — R3915 Urgency of urination: Secondary | ICD-10-CM | POA: Insufficient documentation

## 2014-10-15 DIAGNOSIS — E785 Hyperlipidemia, unspecified: Secondary | ICD-10-CM | POA: Diagnosis not present

## 2014-10-15 DIAGNOSIS — E119 Type 2 diabetes mellitus without complications: Secondary | ICD-10-CM

## 2014-10-15 LAB — POCT GLYCOSYLATED HEMOGLOBIN (HGB A1C): Hemoglobin A1C: 7.1

## 2014-10-15 LAB — POCT UA - MICROALBUMIN: MICROALBUMIN (UR) POC: NEGATIVE mg/L

## 2014-10-15 MED ORDER — IBUPROFEN 800 MG PO TABS: 800.0000 mg | ORAL_TABLET | Freq: Three times a day (TID) | ORAL | Status: DC | PRN

## 2014-10-15 MED ORDER — TOLTERODINE TARTRATE ER 4 MG PO CP24: 4.0000 mg | ORAL_CAPSULE | Freq: Every day | ORAL | Status: DC

## 2014-10-15 MED ORDER — NYSTATIN 100000 UNIT/GM EX CREA: 1.0000 "application " | TOPICAL_CREAM | Freq: Two times a day (BID) | CUTANEOUS | Status: DC

## 2014-10-15 NOTE — Patient Instructions (Signed)

## 2014-10-15 NOTE — Addendum Note (Signed)
Addended by: Chevis Pretty on: 10/15/2014 12:27 PM   Modules accepted: Orders

## 2014-10-15 NOTE — Progress Notes (Signed)
Subjective:    Patient ID: Kenneth Fuller, male    DOB: 1945/05/01, 69 y.o.   MRN: [id number]   Patient here today for follow up of chronic medical problems. Only complaint o day is urinary frequency urgency and occasional incontinence. Has seen dr. Jeffie Pollock with no diagnosis of BPh.   Hypertension This is a recurrent problem. The problem has been waxing and waning since onset. Associated symptoms include anxiety. Pertinent negatives include no chest pain, headaches, palpitations or shortness of breath. Risk factors for coronary artery disease include dyslipidemia, male gender, obesity, sedentary lifestyle and diabetes mellitus. Past treatments include calcium channel blockers and beta blockers. The current treatment provides moderate improvement. Compliance problems include diet and exercise.   Hyperlipidemia This is a chronic problem. The current episode started more than 1 year ago. The problem is controlled. Exacerbating diseases include diabetes and obesity. He has no history of hypothyroidism. Pertinent negatives include no chest pain, myalgias or shortness of breath. Current antihyperlipidemic treatment includes statins. The current treatment provides moderate improvement of lipids. Compliance problems include adherence to diet and adherence to exercise.  Risk factors for coronary artery disease include dyslipidemia, male sex and obesity.  Anxiety Patient reports no chest pain, confusion, decreased concentration, dizziness, nausea, palpitations or shortness of breath.   His past medical history is significant for asthma.  Asthma There is no cough, shortness of breath or wheezing. Pertinent negatives include no chest pain, headaches or myalgias. His past medical history is significant for asthma.  Diabetes He presents for his follow-up diabetic visit. He has type 2 diabetes mellitus. No MedicAlert identification noted. His disease course has been fluctuating. Pertinent negatives for  hypoglycemia include no confusion, dizziness or headaches. Pertinent negatives for diabetes include no chest pain, no polydipsia, no polyphagia, no polyuria and no weakness. Symptoms are stable. Risk factors for coronary artery disease include dyslipidemia, family history, hypertension, male sex and obesity. Current diabetic treatment includes oral agent (monotherapy). He is compliant with treatment none of the time. His weight is stable. He is following a high fat/cholesterol diet. He has not had a previous visit with a dietitian. His breakfast blood glucose is taken between 9-10 am. His breakfast blood glucose range is generally 130-140 mg/dl. An ACE inhibitor/angiotensin II receptor blocker is not being taken. He does not see a podiatrist.Eye exam is current (August 2016).  Hypokalemia Occassional lower ext cramping. Gerd Controlled with omeprazole- No symptoms when takes meds. Peripheral edema Lasix 40 mg daily. Patient says if he misses a pill then he starts to swell  Review of Systems  Constitutional: Negative.   Respiratory: Negative for cough, shortness of breath and wheezing.   Cardiovascular: Negative for chest pain and palpitations.  Gastrointestinal: Negative for nausea.  Endocrine: Negative for polydipsia, polyphagia and polyuria.  Genitourinary: Positive for urgency.       Ocassional  Incontinent episodes.   Musculoskeletal: Negative for myalgias.  Neurological: Negative for dizziness, weakness and headaches.  Psychiatric/Behavioral: Negative for confusion and decreased concentration.  All other systems reviewed and are negative.      Objective:   Physical Exam  Constitutional: He is oriented to person, place, and time. He appears well-developed and well-nourished.  HENT:  Head: Normocephalic.  Right Ear: External ear normal.  Left Ear: External ear normal.  Nose: Nose normal.  Mouth/Throat: Oropharynx is clear and moist.  Eyes: EOM are normal. Pupils are equal, round,  and reactive to light.  Neck: Normal range of motion. Neck supple. No JVD  present. No thyromegaly present.  Cardiovascular: Normal rate, regular rhythm, normal heart sounds and intact distal pulses.  Exam reveals no gallop and no friction rub.   No murmur heard. Pulmonary/Chest: Effort normal. No respiratory distress. He has no wheezes. He has no rales. He exhibits no tenderness.  Abdominal: Soft. Bowel sounds are normal. He exhibits no mass. There is no tenderness.  Musculoskeletal: Normal range of motion. He exhibits no edema.  Lymphadenopathy:    He has no cervical adenopathy.  Neurological: He is alert and oriented to person, place, and time. No cranial nerve deficit.  Skin: Skin is warm and dry.  Psychiatric: He has a normal mood and affect. His behavior is normal. Judgment and thought content normal.   BP 130/86 mmHg  Pulse 67  Temp(Src) 98.5 F (36.9 C) (Oral)  Ht _0  (1.702 m)  Wt 279 lb (126.554 kg)  BMI 43.69 kg/m2    Results for orders placed or performed in visit on 10/15/14  POCT glycosylated hemoglobin (Hb A1C)  Result Value Ref Range   Hemoglobin A1C 7.1   POCT UA - Microalbumin  Result Value Ref Range   Microalbumin Ur, POC negative mg/L     Assessment & Plan:  1. Hyperlipidemia with target LDL less than 100 No fatty foods. Continue with exercise on regular basis. - Lipid panel  2. Essential hypertension No added salt to your diet and continue with needed exercise - CMP14+EGFR  3. Type 2 diabetes mellitus without complication Carb counting, continue with CBGs at home. Repeat labs in 3 months - POCT glycosylated hemoglobin (Hb A1C) - POCT UA - Microalbumin  4. Asthma, mild intermittent, uncomplicated   5. Gastroesophageal reflux disease without esophagitis Avoid spicy foods and eating 2 hours prior to bedtime.  6.  Severe obesity (BMI>=40) Diet and exercise   7. Urinary urgency -detrol LA 30m 1 po qd   Continue all meds Labs pending Health  Maintenance reviewed Diet and exercise encouraged RTO 3 months  Mary-Margaret MHassell Done FNP

## 2014-10-16 ENCOUNTER — Other Ambulatory Visit: Payer: Self-pay | Admitting: Family Medicine

## 2014-10-16 LAB — CMP14+EGFR
ALBUMIN: 4.2 g/dL (ref 3.6–4.8)
ALT: 28 IU/L (ref 0–44)
AST: 19 IU/L (ref 0–40)
Albumin/Globulin Ratio: 1.6 (ref 1.1–2.5)
Alkaline Phosphatase: 66 IU/L (ref 39–117)
BUN/Creatinine Ratio: 14 (ref 10–22)
BUN: 13 mg/dL (ref 8–27)
Bilirubin Total: 0.4 mg/dL (ref 0.0–1.2)
CALCIUM: 9.6 mg/dL (ref 8.6–10.2)
CO2: 27 mmol/L (ref 18–29)
CREATININE: 0.94 mg/dL (ref 0.76–1.27)
Chloride: 96 mmol/L — ABNORMAL LOW (ref 97–108)
GFR calc Af Amer: 96 mL/min/{1.73_m2} (ref >59)
GFR, EST NON AFRICAN AMERICAN: 83 mL/min/{1.73_m2} (ref >59)
GLOBULIN, TOTAL: 2.7 g/dL (ref 1.5–4.5)
Glucose: 139 mg/dL — ABNORMAL HIGH (ref 65–99)
Potassium: 4.7 mmol/L (ref 3.5–5.2)
SODIUM: 139 mmol/L (ref 134–144)
Total Protein: 6.9 g/dL (ref 6.0–8.5)

## 2014-10-16 LAB — LIPID PANEL
CHOL/HDL RATIO: 3 ratio (ref 0.0–5.0)
Cholesterol, Total: 124 mg/dL (ref 100–199)
HDL: 41 mg/dL (ref >39)
LDL CALC: 48 mg/dL (ref 0–99)
TRIGLYCERIDES: 173 mg/dL — AB (ref 0–149)
VLDL CHOLESTEROL CAL: 35 mg/dL (ref 5–40)

## 2014-10-25 ENCOUNTER — Encounter: Payer: Self-pay | Admitting: Cardiovascular Disease

## 2014-11-07 ENCOUNTER — Ambulatory Visit: Payer: Medicare Other | Admitting: Family Medicine

## 2014-11-07 ENCOUNTER — Other Ambulatory Visit: Payer: Self-pay | Admitting: Family Medicine

## 2014-11-09 ENCOUNTER — Encounter: Payer: Self-pay | Admitting: *Deleted

## 2014-11-11 ENCOUNTER — Other Ambulatory Visit: Payer: Self-pay | Admitting: Nurse Practitioner

## 2014-11-25 ENCOUNTER — Other Ambulatory Visit: Payer: Self-pay | Admitting: *Deleted

## 2014-11-25 DIAGNOSIS — R3915 Urgency of urination: Secondary | ICD-10-CM

## 2014-11-25 MED ORDER — TOLTERODINE TARTRATE ER 4 MG PO CP24: 4.0000 mg | ORAL_CAPSULE | Freq: Every day | ORAL | Status: DC

## 2014-11-27 ENCOUNTER — Other Ambulatory Visit: Payer: Self-pay | Admitting: Family Medicine

## 2014-12-04 ENCOUNTER — Ambulatory Visit (INDEPENDENT_AMBULATORY_CARE_PROVIDER_SITE_OTHER): Payer: Medicare Other | Admitting: Family Medicine

## 2014-12-04 ENCOUNTER — Encounter: Payer: Self-pay | Admitting: Family Medicine

## 2014-12-04 VITALS — BP 136/85 | HR 65 | Temp 97.3°F | Ht 67.0 in | Wt 282.4 lb

## 2014-12-04 DIAGNOSIS — Z23 Encounter for immunization: Secondary | ICD-10-CM

## 2014-12-04 DIAGNOSIS — Z0000 Encounter for general adult medical examination without abnormal findings: Secondary | ICD-10-CM

## 2014-12-04 DIAGNOSIS — R3915 Urgency of urination: Secondary | ICD-10-CM

## 2014-12-04 DIAGNOSIS — D229 Melanocytic nevi, unspecified: Secondary | ICD-10-CM

## 2014-12-04 MED ORDER — MIRABEGRON ER 50 MG PO TB24: 50.0000 mg | ORAL_TABLET | Freq: Every day | ORAL | Status: DC

## 2014-12-04 NOTE — Progress Notes (Signed)
Subjective:   Kenneth Fuller is a 69 y.o. male who presents for Medicare Annual/Subsequent preventive examination.  Review of Systems:  Review of Systems  Constitutional: Negative for fever and chills.  HENT: Negative for ear pain and tinnitus.   Eyes: Negative for blurred vision and pain.  Respiratory: Negative for cough, shortness of breath and wheezing.   Cardiovascular: Negative for chest pain, palpitations and leg swelling.  Gastrointestinal: Negative for abdominal pain, diarrhea, constipation, blood in stool and melena.  Genitourinary: Positive for urgency and frequency. Negative for dysuria and hematuria.  Musculoskeletal: Negative for myalgias, back pain and joint pain.  Skin: Negative for rash.       Abnormal mole  Neurological: Negative for dizziness, sensory change, focal weakness, weakness and headaches.  Psychiatric/Behavioral: Negative for depression and suicidal ideas.    Cardiac Risk Factors include: advanced age (>43mn, >>75women);diabetes mellitus;dyslipidemia;sedentary lifestyle;obesity (BMI >30kg/m2);male gender;hypertension     Objective:    Vitals: BP 136/85 mmHg  Pulse 65  Temp(Src) 97.3 F (36.3 C) (Oral)  Ht _0  (1.702 m)  Wt 282 lb 6.4 oz (128.096 kg)  BMI 44.22 kg/m2  SpO2 96%  Tobacco History  Smoking status  . Former Smoker  . Quit date: 02/08/1969  Smokeless tobacco  . Former UEngineer, structuralgiven: Not Answered   Past Medical History  Diagnosis Date  . MRSA cellulitis   . Diabetes mellitus   . Hypogonadism male   . Obesity   . Hypertension   . Hyperlipidemia   . GI bleeding   . Fibromyalgia   . Gout   . CAD (coronary artery disease)   . Neuropathy ([place]   . Sleep apnea     cpap- 14   . Shortness of breath dyspnea     with exertion   . GERD (gastroesophageal reflux disease)    Past Surgical History  Procedure Laterality Date  . Neck fusion    . Back surgery    . Colonoscopy    . Cardiac catheterization     . Lumbar laminectomy/decompression microdiscectomy N/A 01/02/2014    Procedure: CENTRAL DECOMPRESSION LUMBAR LAMINECTOMY L3-L4, L4-L5;  Surgeon: RTobi Bastos MD;  Location: [facility];  Service: Orthopedics;  Laterality: N/A;   Family History  Problem Relation Age of Onset  . Colon cancer Mother   . Diabetes Father     siblings  . Heart disease Father     brother  . Kidney disease Sister    History  Sexual Activity  . Sexual Activity: Not on file    Outpatient Encounter Prescriptions as of 12/04/2014  Medication Sig  . allopurinol (ZYLOPRIM) 100 MG tablet 100 mg daily.   .Marland KitchenamLODipine (NORVASC) 10 MG tablet TAKE 1/2 (5MG) BY MOUTH DAILY --MChevis PrettyNP  . Blood Glucose Monitoring Suppl (PRODIGY BLOOD GLUCOSE MONITOR) W/DEVICE KIT 1 Units by Does not apply route daily. Check blood sugars 1X per day-- dx 250.02  . Cholecalciferol (VITAMIN D PO) Take 1 tablet by mouth every morning.  . Fluticasone-Salmeterol (ADVAIR) 100-50 MCG/DOSE AEPB Inhale 1 puff into the lungs 2 (two) times daily.  . furosemide (LASIX) 40 MG tablet TAKE 1 BY MOUTH TWICE DAILY --MARY-MARGARET MARTIN (Patient taking differently: daily)  . glucose blood test strip Test 1X per day and prn- Dx Code E11.9. One Touch Ultra Test Strips (Patient taking differently: 1 each by Other route daily. Test 1X per day and prn- Dx Code E11.9. One Touch Ultra Test  Strips)  . ibuprofen (ADVIL,MOTRIN) 800 MG tablet TAKE 1 (800MG TOTAL) BY MOUTH EVERY 8 HOURS AS NEEDED  . KLOR-CON M10 10 MEQ tablet TAKE ONE TABLET BY MOUTH ONCE DAILY  . metFORMIN (GLUCOPHAGE) 1000 MG tablet Take 1 tablet (1,000 mg total) by mouth 2 (two) times daily with a meal.  . metoprolol (LOPRESSOR) 50 MG tablet TAKE 2 BY MOUTH EVERY MORNING AND 1 BY MOUTH AT BEDTIME (Patient taking differently: one twice daily)  . nystatin cream (MYCOSTATIN) APPLY TOPICALLY 2 TIMES DAILY  . omeprazole (PRILOSEC) 40 MG capsule Take 1 capsule (40 mg total) by mouth daily.    [name] DELICA LANCETS 17O MISC 1 each by Does not apply route daily. Test 1X per day and prn - DX 250.02 (Patient taking differently: 1 each by Other route daily. Test 1X per day and prn - DX 250.02)  . potassium chloride (K-DUR,KLOR-CON) 10 MEQ tablet Take 1 tablet (10 mEq total) by mouth once.  . pravastatin (PRAVACHOL) 40 MG tablet Take 1 tablet (40 mg total) by mouth daily.  [name] tolterodine (DETROL LA) 4 MG 24 hr capsule Take 1 capsule (4 mg total) by mouth daily.  [name] HYDROmorphone (DILAUDID) 2 MG tablet Take 1 tablet (2 mg total) by mouth every 6 (six) hours as needed for moderate pain. (Patient not taking: Reported on 10/15/2014)  . meloxicam (MOBIC) 15 MG tablet   . Propylene Glycol (SYSTANE BALANCE OP) Place 1 drop into both eyes daily as needed (dry eyes.).   No facility-administered encounter medications on file as of 12/04/2014.    Activities of Daily Living In your present state of health, do you have any difficulty performing the following activities: 12/04/2014 01/24/2014  Hearing? N N  Vision? Y N  Difficulty concentrating or making decisions? N N  Walking or climbing stairs? Y Y  Dressing or bathing? N N  Doing errands, shopping? N N  Preparing Food and eating ? N -  Using the Toilet? N -  In the past six months, have you accidently leaked urine? Y -  Do you have problems with loss of bowel control? N -  Managing your Medications? N -  Managing your Finances? N -  Housekeeping or managing your Housekeeping? N -    Patient Care Team: Chevis Pretty, FNP as PCP - General (Nurse Practitioner)   Assessment:    Problem List Items Addressed This Visit      Other   Urinary urgency    Patient feels Detrol is not working and will switch to [employer]       Other Visit Diagnoses    Routine history and physical examination of adult    -  Primary    Nevus, atypical        right posterior arm       Exercise Activities and Dietary recommendations Current  Exercise Habits:: The patient does not participate in regular exercise at present  Goals    None     Fall Risk Fall Risk  12/04/2014 10/15/2014 01/24/2014 07/09/2013  Falls in the past year? No No Yes No  Number falls in past yr: - - 2 or more -   Depression Screen PHQ 2/9 Scores 12/04/2014 10/15/2014 01/24/2014 01/24/2014  PHQ - 2 Score 0 0 0 0    Cognitive Testing MMSE - Mini Mental State Exam 12/04/2014  Orientation to time 5  Orientation to Place 5  Registration 3  Attention/ Calculation 5  Recall 3  Language- name 2  objects 2  Language- repeat 1  Language- follow 3 step command 3  Language- read & follow direction 1  Write a sentence 1  Copy design 1  Total score 30    Immunization History  Administered Date(s) Administered  . Influenza Whole 10/09/2008  . Influenza,inj,Quad PF,36+ Mos 12/04/2013  . Pneumococcal Conjugate-13 12/04/2013   Screening Tests Health Maintenance  Topic Date Due  . INFLUENZA VACCINE  04/14/2015 (Originally 09/09/2014)  . ZOSTAVAX  04/14/2015 (Originally 05/03/2006)  . TETANUS/TDAP  04/14/2015 (Originally 05/02/1965)  . Hepatitis C Screening  04/14/2015 (Originally March 13, 1945)  . PNA vac Low Risk Adult (2 of 2 - PPSV23) 12/05/2014  . HEMOGLOBIN A1C  04/14/2015  . OPHTHALMOLOGY EXAM  10/11/2015  . FOOT EXAM  10/15/2015  . URINE MICROALBUMIN  10/15/2015  . COLONOSCOPY  01/22/2016      Plan:    During the course of the visit the patient was educated and counseled about the following appropriate screening and preventive services:   Vaccines to include Pneumoccal, Influenza, Hepatitis B, Td, Zostavax, HCV  Electrocardiogram  Cardiovascular Disease  Colorectal cancer screening  Diabetes screening  Prostate Cancer Screening  Glaucoma screening  Nutrition counseling   Smoking cessation counseling  Patient Instructions (the written plan) was given to the patient.    Worthy Rancher, MD  12/04/2014

## 2014-12-04 NOTE — Assessment & Plan Note (Signed)
Patient feels Detrol is not working and will switch to [employer]

## 2014-12-05 ENCOUNTER — Ambulatory Visit (INDEPENDENT_AMBULATORY_CARE_PROVIDER_SITE_OTHER): Payer: Medicare Other | Admitting: Cardiovascular Disease

## 2014-12-05 ENCOUNTER — Encounter: Payer: Self-pay | Admitting: Cardiovascular Disease

## 2014-12-05 VITALS — BP 128/88 | HR 62 | Resp 16 | Ht 67.0 in | Wt 283.0 lb

## 2014-12-05 DIAGNOSIS — E662 Morbid (severe) obesity with alveolar hypoventilation: Secondary | ICD-10-CM

## 2014-12-05 DIAGNOSIS — R0600 Dyspnea, unspecified: Secondary | ICD-10-CM

## 2014-12-05 DIAGNOSIS — I10 Essential (primary) hypertension: Secondary | ICD-10-CM

## 2014-12-05 DIAGNOSIS — I2510 Atherosclerotic heart disease of native coronary artery without angina pectoris: Secondary | ICD-10-CM

## 2014-12-05 DIAGNOSIS — G4730 Sleep apnea, unspecified: Secondary | ICD-10-CM

## 2014-12-05 NOTE — Progress Notes (Signed)
Patient ID: Kenneth Fuller, male   DOB: May 22, 1945, 69 y.o.   MRN: [id number]     Cardiology Office Note   Date:  12/05/2014   ID:  Kenneth Fuller, DOB 11-19-1945, MRN [id number]  PCP:  Kenneth Pretty, FNP  Cardiologist:   Sanda Klein, MD   Chief Complaint  Patient presents with  . OVERDUE FOLLOW UP  . Dizziness  . Shortness of Breath  . Chest Pain  . Edema    ankles      History of Present Illness: Kenneth Fuller is a 69 y.o. male who presents for  Follow-up for minor coronary artery disease , obstructive sleep apnea on CPAP, hypertension and hyperlipidemia.   this is his first cardiology evaluation since 2011. At that time he had atypical chest symptoms and his nuclear stress test showed an inferior defect. Cardiac catheterization confirmed that the inferior defect was an artifact related to diaphragmatic attenuation and there were no meaningful coronary obstructive lesions. He did have some 20-40 percent stenoses primarily in the LAD artery and its segmental branches. These were not flow-limiting.    since his last visit in 2011 he has noticed gradually worsening exertional dyspnea which has now reached functional class III. Even taking a shower makes him very short of breath. He does not have anginal chest pain.  He has had occasional chest discomfort after a meal that feels like heartburn and resolves promptly with antacids.   He has morbid obesity with a BMI of 44. He has obstructive sleep apnea but exhibits poor compliant with CPAP. His biggest problem is the fact that he wakes up with very dry mouth. His tongue is literally stuck to the roof of his mouth and he has to drink water to unstick it.   he denies syncope, palpitations, focal nausea deficits and lower extremity edema. He does describe dizziness consistent with brief orthostatic hypotension.   He expresses interest in bariatric surgery    Past Medical History  Diagnosis Date  . MRSA cellulitis    . Diabetes mellitus   . Hypogonadism male   . Obesity   . Hypertension   . Hyperlipidemia   . GI bleeding   . Fibromyalgia   . Gout   . CAD (coronary artery disease)   . Neuropathy ([place])   . Sleep apnea     cpap- 14   . Shortness of breath dyspnea     with exertion   . GERD (gastroesophageal reflux disease)     Past Surgical History  Procedure Laterality Date  . Neck fusion    . Back surgery    . Colonoscopy    . Cardiac catheterization    . Lumbar laminectomy/decompression microdiscectomy N/A 01/02/2014    Procedure: CENTRAL DECOMPRESSION LUMBAR LAMINECTOMY L3-L4, L4-L5;  Surgeon: Tobi Bastos, MD;  Location: [facility];  Service: Orthopedics;  Laterality: N/A;     Current Outpatient Prescriptions  Medication Sig Dispense Refill  . allopurinol (ZYLOPRIM) 100 MG tablet 100 mg as needed.     [name] amLODipine (NORVASC) 10 MG tablet TAKE 1/2 (5MG) BY MOUTH DAILY --MARY-MARGARET MARTIN NP 45 tablet 0  . atorvastatin (LIPITOR) 40 MG tablet Take 40 mg by mouth daily.    . Blood Glucose Monitoring Suppl (PRODIGY BLOOD GLUCOSE MONITOR) W/DEVICE KIT 1 Units by Does not apply route daily. Check blood sugars 1X per day-- dx 250.02 1 each 0  . Cholecalciferol (VITAMIN D PO) Take 1 tablet by mouth every morning.    [name]  Fluticasone-Salmeterol (ADVAIR) 100-50 MCG/DOSE AEPB Inhale 1 puff into the lungs 2 (two) times daily. 1 each 1  . furosemide (LASIX) 40 MG tablet TAKE 1 BY MOUTH TWICE DAILY --MARY-MARGARET MARTIN (Patient taking differently: daily) 180 tablet 0  . glucose blood test strip Test 1X per day and prn- Dx Code E11.9. One Touch Ultra Test Strips (Patient taking differently: 1 each by Other route daily. Test 1X per day and prn- Dx Code E11.9. One Touch Ultra Test Strips) 100 each 2  . ibuprofen (ADVIL,MOTRIN) 800 MG tablet TAKE 1 (800MG TOTAL) BY MOUTH EVERY 8 HOURS AS NEEDED 30 tablet 0  . KLOR-CON M10 10 MEQ tablet TAKE ONE TABLET BY MOUTH ONCE DAILY 90 tablet 1  . metFORMIN  (GLUCOPHAGE) 1000 MG tablet Take 1 tablet (1,000 mg total) by mouth 2 (two) times daily with a meal. 180 tablet 1  . metoprolol (LOPRESSOR) 50 MG tablet TAKE 2 BY MOUTH EVERY MORNING AND 1 BY MOUTH AT BEDTIME (Patient taking differently: one twice daily) 270 tablet 0  . omeprazole (PRILOSEC) 40 MG capsule Take 1 capsule (40 mg total) by mouth daily. 90 capsule 1  . ONETOUCH DELICA LANCETS 35T MISC 1 each by Does not apply route daily. Test 1X per day and prn - DX 250.02 (Patient taking differently: 1 each by Other route daily. Test 1X per day and prn - DX 250.02) 100 each 11  . potassium chloride (K-DUR,KLOR-CON) 10 MEQ tablet Take 1 tablet (10 mEq total) by mouth once. 90 tablet 1  . Propylene Glycol (SYSTANE BALANCE OP) Place 1 drop into both eyes daily as needed (dry eyes.).     No current facility-administered medications for this visit.    Allergies:   Amlodipine besy-benazepril hcl; Phenergan; and Hydrocodone    Social History:  The patient  reports that he quit smoking about 45 years ago. He has quit using smokeless tobacco. He reports that he does not drink alcohol or use illicit drugs.   Family History:  The patient's family history includes Colon cancer in his mother; Diabetes in his father; Heart disease in his father; Kidney disease in his sister.    ROS:  Please see the history of present illness.    Otherwise, review of systems positive for none.   All other systems are reviewed and negative.    PHYSICAL EXAM: VS:  BP 128/88 mmHg  Pulse 62  Resp 16  Ht _0  (1.702 m)  Wt 283 lb (128.368 kg)  BMI 44.31 kg/m2 , BMI Body mass index is 44.31 kg/(m^2).  General: Alert, oriented x3, no distress. Exam is limited somewhat by obesity Head: no evidence of trauma, PERRL, EOMI, no exophtalmos or lid lag, no myxedema, no xanthelasma; normal ears, nose and oropharynx Neck: normal jugular venous pulsations and no hepatojugular reflux; brisk carotid pulses without delay and no carotid  bruits Chest: clear to auscultation, no signs of consolidation by percussion or palpation, normal fremitus, symmetrical and full respiratory excursions Cardiovascular:  Difficult to locate the apical impulse, regular rhythm, normal first and second heart sounds, no  murmurs, rubs or gallops Abdomen: no tenderness or distention, no masses by palpation, no abnormal pulsatility or arterial bruits, normal bowel sounds, no hepatosplenomegaly Extremities: no clubbing, cyanosis or edema; 2+ radial, ulnar and brachial pulses bilaterally; 2+ right femoral, posterior tibial and dorsalis pedis pulses; 2+ left femoral, posterior tibial and dorsalis pedis pulses; no subclavian or femoral bruits Neurological: grossly nonfocal Psych: euthymic mood, full affect   EKG:  EKG is ordered today. The ekg ordered today demonstrates  Normal sinus rhythm, normal tracing   Recent Labs: 01/24/2014: Hemoglobin 14.4; Platelets 312 10/15/2014: ALT 28; BUN 13; Creatinine, Ser 0.94; Potassium 4.7; Sodium 139    Lipid Panel    Component Value Date/Time   CHOL 124 10/15/2014 1125   CHOL 115 01/24/2014 1143   CHOL 111 06/12/2012 1232   TRIG 173* 10/15/2014 1125   TRIG 141 01/24/2014 1143   TRIG 192* 06/12/2012 1232   HDL 41 10/15/2014 1125   HDL 38* 01/24/2014 1143   HDL 33* 06/12/2012 1232   HDL 32* 11/22/2006 1712   CHOLHDL 3.0 10/15/2014 1125   CHOLHDL 4.8 11/22/2006 1712   VLDL 39 11/22/2006 1712   LDLCALC 48 10/15/2014 1125   LDLCALC 27 10/25/2013 1224   LDLCALC 40 06/12/2012 1232   LDLCALC  11/22/2006 1712    83        Total Cholesterol/HDL:CHD Risk Coronary Heart Disease Risk Table                     Men   Women  1/2 Average Risk   3.4   3.3      Wt Readings from Last 3 Encounters:  12/05/14 283 lb (128.368 kg)  12/04/14 282 lb 6.4 oz (128.096 kg)  10/15/14 279 lb (126.554 kg)     ASSESSMENT AND PLAN:  1.  Exertional dyspnea. At his previous evaluation in 2011 he had normal left ventricular  ejection fraction by echo and nuclear stress testing and LV angiography. He also had normal left ventricular end-diastolic pressure by direct invasive measurement. He does have risk factors for diastolic dysfunction especially hypertension and obesity. Would like to repeat an echocardiogram.   is quite likely that his dyspnea is related to pulmonary hypertension , in turn related to obesity and poorly treated obstructive sleep apnea. I think his CPAP equipment should be moderate in eyes. I'm not sure that he has a bilevel machine or one that can record physiological data. I asked him to set up a sleep clinic appointment with Dr. Ellouise Newer and to bring his equipment with him to that appointment. Reviewed the pathophysiology of sleep apnea and how it can lead to pulmonary hypertension/cor pulmonale and congestive heart failure.  2.  Essential hypertension, well controlled  3.  Hyperlipidemia with excellent LDL cholesterol on statin therapy  4.  Coronary artery disease, nonobstructive by angiography in 2011. Current symptoms did not suggest coronary insufficiency. If echo does not provide a mechanism for his dyspnea, consider repeat nuclear stress testing , being wary of the presence of an inferior wall artifact due to diaphragmatic attenuation.  5.  Morbid obesity. If his cardiac workup is benign and we can establish good treatment of her obstructive sleep apnea, I would encourage him to pursue bariatric surgery.    Current medicines are reviewed at length with the patient today.  The patient does not have concerns regarding medicines.  The following changes have been made:  no change  Labs/ tests ordered today include:  Orders Placed This Encounter  Procedures  . EKG 12-Lead  . ECHOCARDIOGRAM COMPLETE    Patient Instructions  Your physician has requested that you have an echocardiogram. Echocardiography is a painless test that uses sound waves to create images of your heart. It provides your  doctor with information about the size and shape of your heart and how well your heart's chambers and valves are working. This procedure takes approximately  one hour. There are no restrictions for this procedure.  Your physician recommends that you schedule a follow-up appointment in: NEXT [place] TO ASSESS YOU CPAP DEVICE.  Dr. Sallyanne Kuster recommends that you schedule a follow-up appointment in: FOLLOWING ECHOCARDIOGRAM NEXT AVAILABLE.        Mikael Spray, MD  12/05/2014 9:58 PM    Sanda Klein, MD, [facility] [phone] office [phone] pager

## 2014-12-05 NOTE — Patient Instructions (Signed)
Your physician has requested that you have an echocardiogram. Echocardiography is a painless test that uses sound waves to create images of your heart. It provides your doctor with information about the size and shape of your heart and how well your heart's chambers and valves are working. This procedure takes approximately one hour. There are no restrictions for this procedure.  Your physician recommends that you schedule a follow-up appointment in: NEXT [place] TO ASSESS YOU CPAP DEVICE.  Dr. Sallyanne Kuster recommends that you schedule a follow-up appointment in: FOLLOWING ECHOCARDIOGRAM NEXT AVAILABLE.

## 2014-12-13 ENCOUNTER — Encounter: Payer: Self-pay | Admitting: *Deleted

## 2014-12-23 ENCOUNTER — Encounter: Payer: Self-pay | Admitting: Family Medicine

## 2014-12-23 ENCOUNTER — Ambulatory Visit (INDEPENDENT_AMBULATORY_CARE_PROVIDER_SITE_OTHER): Payer: Medicare Other | Admitting: Family Medicine

## 2014-12-23 VITALS — BP 125/80 | HR 70 | Temp 97.9°F | Ht 67.0 in | Wt 283.0 lb

## 2014-12-23 DIAGNOSIS — D2361 Other benign neoplasm of skin of right upper limb, including shoulder: Secondary | ICD-10-CM | POA: Diagnosis not present

## 2014-12-23 DIAGNOSIS — D2261 Melanocytic nevi of right upper limb, including shoulder: Secondary | ICD-10-CM

## 2014-12-23 NOTE — Addendum Note (Signed)
Addended by: Selmer Dominion on: 12/23/2014 02:22 PM   Modules accepted: Orders

## 2014-12-23 NOTE — Progress Notes (Signed)
BP 125/80 mmHg  Pulse 70  Temp(Src) 97.9 F (36.6 C) (Oral)  Ht _0  (1.702 m)  Wt 283 lb (128.368 kg)  BMI 44.31 kg/m2   Subjective:    Patient ID: Kenneth Fuller, male    DOB: Oct 12, 1945, 69 y.o.   MRN: [id number]  HPI: Kenneth Fuller is a 69 y.o. male presenting on 12/23/2014 for Mole removal   HPI Atypical nevus Patient comes in today for removal of an atypical skin lesion on his right anterior forearm that is slightly greater than a half a centimeter in size. Has irregular borders and at least 2 different colors to it. He also says that has been growing recently.  Relevant past medical, surgical, family and social history reviewed and updated as indicated. Interim medical history since our last visit reviewed. Allergies and medications reviewed and updated.  Review of Systems  Respiratory: Negative for shortness of breath and wheezing.   Cardiovascular: Negative for chest pain and leg swelling.  Skin: Negative for rash.       Skin lesion  All other systems reviewed and are negative.   Per HPI unless specifically indicated above     Medication List       This list is accurate as of: 12/23/14 11:29 AM.  Always use your most recent med list.               allopurinol 100 MG tablet  Commonly known as:  ZYLOPRIM  100 mg as needed.     amLODipine 10 MG tablet  Commonly known as:  NORVASC  TAKE 1/2 (5MG) BY MOUTH DAILY --MARY-MARGARET MARTIN NP     atorvastatin 40 MG tablet  Commonly known as:  LIPITOR  Take 40 mg by mouth daily.     Fluticasone-Salmeterol 100-50 MCG/DOSE Aepb  Commonly known as:  ADVAIR  Inhale 1 puff into the lungs 2 (two) times daily.     furosemide 40 MG tablet  Commonly known as:  LASIX  TAKE 1 BY MOUTH TWICE DAILY --MARY-MARGARET MARTIN     glucose blood test strip  Test 1X per day and prn- Dx Code E11.9. One Touch Ultra Test Strips     ibuprofen 800 MG tablet  Commonly known as:  ADVIL,MOTRIN  TAKE 1 (800MG TOTAL)  BY MOUTH EVERY 8 HOURS AS NEEDED     KLOR-CON M10 10 MEQ tablet  Generic drug:  potassium chloride  TAKE ONE TABLET BY MOUTH ONCE DAILY     metFORMIN 1000 MG tablet  Commonly known as:  GLUCOPHAGE  Take 1 tablet (1,000 mg total) by mouth 2 (two) times daily with a meal.     metoprolol 50 MG tablet  Commonly known as:  LOPRESSOR  TAKE 2 BY MOUTH EVERY MORNING AND 1 BY MOUTH AT BEDTIME     omeprazole 40 MG capsule  Commonly known as:  PRILOSEC  Take 1 capsule (40 mg total) by mouth daily.     ONETOUCH DELICA LANCETS 17C Misc  1 each by Does not apply route daily. Test 1X per day and prn - DX 250.02     PRODIGY BLOOD GLUCOSE MONITOR W/DEVICE Kit  1 Units by Does not apply route daily. Check blood sugars 1X per day-- dx 250.02     SYSTANE BALANCE OP  Place 1 drop into both eyes daily as needed (dry eyes.).     VITAMIN D PO  Take 1 tablet by mouth every morning.  Objective:    BP 125/80 mmHg  Pulse 70  Temp(Src) 97.9 F (36.6 C) (Oral)  Ht _0  (1.702 m)  Wt 283 lb (128.368 kg)  BMI 44.31 kg/m2  Wt Readings from Last 3 Encounters:  12/23/14 283 lb (128.368 kg)  12/05/14 283 lb (128.368 kg)  12/04/14 282 lb 6.4 oz (128.096 kg)    Physical Exam  Constitutional: He is oriented to person, place, and time. He appears well-developed and well-nourished. No distress.  Eyes: Conjunctivae and EOM are normal. Pupils are equal, round, and reactive to light. Right eye exhibits no discharge. No scleral icterus.  Cardiovascular: Normal rate, regular rhythm, normal heart sounds and intact distal pulses.   No murmur heard. Pulmonary/Chest: Effort normal and breath sounds normal. No respiratory distress. He has no wheezes.  Musculoskeletal: Normal range of motion. He exhibits no edema.  Neurological: He is alert and oriented to person, place, and time. Coordination normal.  Skin: Skin is warm and dry. No rash noted. He is not diaphoretic.     Psychiatric: He has a normal  mood and affect. His behavior is normal.  Vitals reviewed.   Results for orders placed or performed in visit on 11/09/14  HM DIABETES EYE EXAM  Result Value Ref Range   HM Diabetic Eye Exam No Retinopathy No Retinopathy   Skin lesion removal: Elliptical incision was made following along skin lines giving margins of 2 mm on either side of the lesion. 2% lidocaine with epinephrine was used for local anesthesia, 2m. 3-0 PGA was used to repair the wound.  Placed 7 sutures. Incision was approximated well and topical antibiotic was used and then it was covered by 4 x 4 and tape told in place. Procedure was tolerated well     Assessment & Plan:       Problem List Items Addressed This Visit    None    Visit Diagnoses    Atypical nevus of forearm, right    -  Primary    Relevant Orders    Dermatology pathology        Follow up plan: Return in about 9 days (around 01/01/2015), or if symptoms worsen or fail to improve, for Suture removal.  Counseling provided for all of the vaccine components No orders of the defined types were placed in this encounter.    JCaryl Pina MD [facility] 12/23/2014, 11:29 AM

## 2014-12-24 ENCOUNTER — Ambulatory Visit: Payer: Self-pay | Admitting: Family Medicine

## 2014-12-25 ENCOUNTER — Ambulatory Visit (HOSPITAL_COMMUNITY): Payer: Medicare Other | Attending: Cardiovascular Disease

## 2014-12-25 ENCOUNTER — Other Ambulatory Visit: Payer: Self-pay

## 2014-12-25 DIAGNOSIS — I10 Essential (primary) hypertension: Secondary | ICD-10-CM | POA: Diagnosis not present

## 2014-12-25 DIAGNOSIS — Z6841 Body Mass Index (BMI) 40.0 and over, adult: Secondary | ICD-10-CM | POA: Insufficient documentation

## 2014-12-25 DIAGNOSIS — R0600 Dyspnea, unspecified: Secondary | ICD-10-CM | POA: Diagnosis not present

## 2014-12-25 DIAGNOSIS — E785 Hyperlipidemia, unspecified: Secondary | ICD-10-CM | POA: Diagnosis not present

## 2014-12-25 DIAGNOSIS — I517 Cardiomegaly: Secondary | ICD-10-CM | POA: Diagnosis not present

## 2014-12-25 DIAGNOSIS — E119 Type 2 diabetes mellitus without complications: Secondary | ICD-10-CM | POA: Diagnosis not present

## 2014-12-25 LAB — PATHOLOGY

## 2014-12-26 NOTE — Progress Notes (Signed)
Patient informed

## 2015-01-01 ENCOUNTER — Encounter: Payer: Self-pay | Admitting: Family Medicine

## 2015-01-01 ENCOUNTER — Ambulatory Visit (INDEPENDENT_AMBULATORY_CARE_PROVIDER_SITE_OTHER): Payer: Medicare Other | Admitting: Family Medicine

## 2015-01-01 VITALS — BP 137/76 | HR 84 | Temp 97.6°F | Ht 67.0 in | Wt 282.6 lb

## 2015-01-01 DIAGNOSIS — L814 Other melanin hyperpigmentation: Secondary | ICD-10-CM

## 2015-01-01 DIAGNOSIS — B372 Candidiasis of skin and nail: Secondary | ICD-10-CM | POA: Diagnosis not present

## 2015-01-01 MED ORDER — FLUCONAZOLE 150 MG PO TABS: 150.0000 mg | ORAL_TABLET | Freq: Once | ORAL | Status: DC

## 2015-01-01 MED ORDER — SULFAMETHOXAZOLE-TRIMETHOPRIM 800-160 MG PO TABS: 1.0000 | ORAL_TABLET | Freq: Two times a day (BID) | ORAL | Status: DC

## 2015-01-01 NOTE — Progress Notes (Addendum)
BP 137/76 mmHg  Pulse 84  Temp(Src) 97.6 F (36.4 C) (Oral)  Ht _0  (1.702 m)  Wt 282 lb 9.6 oz (128.187 kg)  BMI 44.25 kg/m2   Subjective:    Patient ID: Kenneth Fuller, male    DOB: [birth year], 69 y.o.   MRN: [id number]  HPI: Kenneth Fuller is a 69 y.o. male presenting on 01/01/2015 for Suture / Staple Removal   HPI Solar lentigo and suture removal [name] and suture removal after having his abnormal lesion removed which came back to be a solar lentigo and benign. The lesion site has been looking good until the last couple days when he started to have some more erythema around the site, he denies any purulent drainage or drainage of any kind. He does feel like it's been a little warm on the site as well. He denies any fevers or chills or any redness or warmth anywhere else. 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  Rash in groin Patient has been having a persistent rash in his groin that is dermatitis or jock itch like. It is tender and pink and itchy. He does admit that he gets urinary drainage and plans to go back to see a urologist for this again. He has been trying topical jock itch ointments and athlete's foot powders. They have been keeping it at a steady state but are not working to get rid of it.  Relevant past medical, surgical, family and social history reviewed and updated as indicated. Interim medical history since our last visit reviewed. Allergies and medications reviewed and updated.  Review of Systems  Constitutional: Negative for fever.  HENT: Negative for ear discharge and ear pain.   Eyes: Negative for discharge and visual disturbance.  Respiratory: Negative for shortness of breath and wheezing.   Cardiovascular: Negative for chest pain and leg swelling.  Gastrointestinal: Negative for abdominal pain, diarrhea and constipation.  Genitourinary: Negative for difficulty urinating.  Musculoskeletal: Negative for back pain and  gait problem.  Skin: Positive for rash and wound (healing lesion with small amount of erythema and warmth surrounding it. Possibly early signs of infection).  Neurological: Negative for syncope, light-headedness and headaches.  All other systems reviewed and are negative.   Per HPI unless specifically indicated above     Medication List       This list is accurate as of: 01/01/15 10:28 AM.  Always use your most recent med list.               allopurinol 100 MG tablet  Commonly known as:  ZYLOPRIM  100 mg as needed.     amLODipine 10 MG tablet  Commonly known as:  NORVASC  TAKE 1/2 (5MG) BY MOUTH DAILY --MARY-MARGARET MARTIN NP     atorvastatin 40 MG tablet  Commonly known as:  LIPITOR  Take 40 mg by mouth daily.     fluconazole 150 MG tablet  Commonly known as:  DIFLUCAN  Take 1 tablet (150 mg total) by mouth once.     Fluticasone-Salmeterol 100-50 MCG/DOSE Aepb  Commonly known as:  ADVAIR  Inhale 1 puff into the lungs 2 (two) times daily.     furosemide 40 MG tablet  Commonly known as:  LASIX  TAKE 1 BY MOUTH TWICE DAILY --MARY-MARGARET MARTIN     glucose blood test strip  Test 1X per day and prn- Dx Code E11.9. One Touch Ultra Test Strips     ibuprofen  800 MG tablet  Commonly known as:  ADVIL,MOTRIN  TAKE 1 (800MG TOTAL) BY MOUTH EVERY 8 HOURS AS NEEDED     KLOR-CON M10 10 MEQ tablet  Generic drug:  potassium chloride  TAKE ONE TABLET BY MOUTH ONCE DAILY     metFORMIN 1000 MG tablet  Commonly known as:  GLUCOPHAGE  Take 1 tablet (1,000 mg total) by mouth 2 (two) times daily with a meal.     metoprolol 50 MG tablet  Commonly known as:  LOPRESSOR  TAKE 2 BY MOUTH EVERY MORNING AND 1 BY MOUTH AT BEDTIME     nystatin cream  Commonly known as:  MYCOSTATIN     omeprazole 40 MG capsule  Commonly known as:  PRILOSEC  Take 1 capsule (40 mg total) by mouth daily.     ONETOUCH DELICA LANCETS 58K Misc  1 each by Does not apply route daily. Test 1X per day  and prn - DX 250.02     PRODIGY BLOOD GLUCOSE MONITOR W/DEVICE Kit  1 Units by Does not apply route daily. Check blood sugars 1X per day-- dx 250.02     sulfamethoxazole-trimethoprim 800-160 MG tablet  Commonly known as:  BACTRIM DS,SEPTRA DS  Take 1 tablet by mouth 2 (two) times daily.     SYSTANE BALANCE OP  Place 1 drop into both eyes daily as needed (dry eyes.).     VITAMIN D PO  Take 1 tablet by mouth every morning.           Objective:    BP 137/76 mmHg  Pulse 84  Temp(Src) 97.6 F (36.4 C) (Oral)  Ht _0  (1.702 m)  Wt 282 lb 9.6 oz (128.187 kg)  BMI 44.25 kg/m2  Wt Readings from Last 3 Encounters:  01/01/15 282 lb 9.6 oz (128.187 kg)  12/23/14 283 lb (128.368 kg)  12/05/14 283 lb (128.368 kg)    Physical Exam  Constitutional: He is oriented to person, place, and time. He appears well-developed and well-nourished. No distress.  Eyes: Conjunctivae and EOM are normal. Pupils are equal, round, and reactive to light. Right eye exhibits no discharge. No scleral icterus.  Cardiovascular: Normal rate, regular rhythm, normal heart sounds and intact distal pulses.   No murmur heard. Pulmonary/Chest: Effort normal and breath sounds normal. No respiratory distress. He has no wheezes.  Musculoskeletal: Normal range of motion. He exhibits no edema.  Neurological: He is alert and oriented to person, place, and time. Coordination normal.  Skin: Skin is warm and dry. Rash (pink papules with satellite lesions in groin.) noted. He is not diaphoretic.     Psychiatric: He has a normal mood and affect. His behavior is normal.  Vitals reviewed.   Results for orders placed or performed in visit on 12/23/14  Pathology  Result Value Ref Range   PATH REPORT.SITE OF ORIGIN SPEC Comment    . Comment    PATH REPORT.FINAL DX SPEC Comment    SIGNED OUT BY: Comment    GROSS DESCRIPTION: Comment    . Comment    PAYMENT PROCEDURE Comment       Assessment & Plan:   Problem List  Items Addressed This Visit    None    Visit Diagnoses    Solar lentigo    -  Primary    Patient had lesion removed and came back a solar lentigo, sutures removed today, possibly early signs of infection we'll send Bactrim    Relevant Medications    sulfamethoxazole-trimethoprim (BACTRIM  DS,SEPTRA DS) 800-160 MG tablet    Yeast dermatitis        Patient seen urologist for urine leaking. He continues to have yeast dermatitis despite powder and creams will send Diflucan    Relevant Medications    nystatin cream (MYCOSTATIN)    sulfamethoxazole-trimethoprim (BACTRIM DS,SEPTRA DS) 800-160 MG tablet    fluconazole (DIFLUCAN) 150 MG tablet        Follow up plan: Return if symptoms worsen or fail to improve.  Counseling provided for all of the vaccine components No orders of the defined types were placed in this encounter.    Caryl Pina, MD [facility] 01/01/2015, 10:28 AM

## 2015-01-06 ENCOUNTER — Ambulatory Visit: Payer: Medicare Other | Admitting: Cardiovascular Disease

## 2015-01-06 DIAGNOSIS — R0989 Other specified symptoms and signs involving the circulatory and respiratory systems: Secondary | ICD-10-CM

## 2015-03-10 ENCOUNTER — Ambulatory Visit (INDEPENDENT_AMBULATORY_CARE_PROVIDER_SITE_OTHER): Payer: PPO | Admitting: Family Medicine

## 2015-03-10 ENCOUNTER — Encounter: Payer: Self-pay | Admitting: Family Medicine

## 2015-03-10 VITALS — BP 134/83 | HR 77 | Temp 97.1°F | Ht 67.0 in | Wt 284.4 lb

## 2015-03-10 DIAGNOSIS — L03119 Cellulitis of unspecified part of limb: Secondary | ICD-10-CM

## 2015-03-10 DIAGNOSIS — L02519 Cutaneous abscess of unspecified hand: Secondary | ICD-10-CM | POA: Diagnosis not present

## 2015-03-10 MED ORDER — OXYCODONE-ACETAMINOPHEN 5-325 MG PO TABS: 1.0000 | ORAL_TABLET | Freq: Three times a day (TID) | ORAL | Status: DC | PRN

## 2015-03-10 MED ORDER — SULFAMETHOXAZOLE-TRIMETHOPRIM 800-160 MG PO TABS: 1.0000 | ORAL_TABLET | Freq: Two times a day (BID) | ORAL | Status: DC

## 2015-03-10 NOTE — Progress Notes (Signed)
BP 134/83 mmHg  Pulse 77  Temp(Src) 97.1 F (36.2 C) (Oral)  Ht _0  (1.702 m)  Wt 284 lb 6.4 oz (129.003 kg)  BMI 44.53 kg/m2   Subjective:    Patient ID: Kenneth Fuller, male    DOB: 25-Feb-1945, 70 y.o.   MRN: [id number]  HPI: Kenneth Fuller is a 70 y.o. male presenting on 03/10/2015 for Pain and swelling in right thumb   HPI Pain and swelling at the base of his thumb on his hand Patient has been having trigger finger for quite some time but now he started having significant pain and swelling near the base of his thumb over the thenar eminence. The pain and swelling increased significantly over the past couple days. He also says it's been warm and now he has pain when he moves it in every direction and trouble with grip in that hand. He has never had a like this before. He denies any fevers or chills. He denies any pain or swelling or anywhere else.  Relevant past medical, surgical, family and social history reviewed and updated as indicated. Interim medical history since our last visit reviewed. Allergies and medications reviewed and updated.  Review of Systems  Constitutional: Negative for fever and chills.  HENT: Negative for ear discharge and ear pain.   Eyes: Negative for discharge and visual disturbance.  Respiratory: Negative for shortness of breath and wheezing.   Cardiovascular: Negative for chest pain and leg swelling.  Gastrointestinal: Negative for abdominal pain, diarrhea and constipation.  Genitourinary: Negative for difficulty urinating.  Musculoskeletal: Positive for joint swelling and arthralgias. Negative for back pain and gait problem.  Skin: Positive for color change. Negative for rash and wound.  Neurological: Negative for syncope, light-headedness and headaches.  All other systems reviewed and are negative.   Per HPI unless specifically indicated above     Medication List       This list is accurate as of: 03/10/15  4:44 PM.  Always use  your most recent med list.               allopurinol 100 MG tablet  Commonly known as:  ZYLOPRIM  100 mg as needed.     amLODipine 10 MG tablet  Commonly known as:  NORVASC  TAKE 1/2 (5MG) BY MOUTH DAILY --MARY-MARGARET MARTIN NP     atorvastatin 40 MG tablet  Commonly known as:  LIPITOR  Take 40 mg by mouth daily.     Fluticasone-Salmeterol 100-50 MCG/DOSE Aepb  Commonly known as:  ADVAIR  Inhale 1 puff into the lungs 2 (two) times daily.     furosemide 40 MG tablet  Commonly known as:  LASIX  TAKE 1 BY MOUTH TWICE DAILY --MARY-MARGARET MARTIN     glucose blood test strip  Test 1X per day and prn- Dx Code E11.9. One Touch Ultra Test Strips     ibuprofen 800 MG tablet  Commonly known as:  ADVIL,MOTRIN  TAKE 1 (800MG TOTAL) BY MOUTH EVERY 8 HOURS AS NEEDED     KLOR-CON M10 10 MEQ tablet  Generic drug:  potassium chloride  TAKE ONE TABLET BY MOUTH ONCE DAILY     metFORMIN 1000 MG tablet  Commonly known as:  GLUCOPHAGE  Take 1 tablet (1,000 mg total) by mouth 2 (two) times daily with a meal.     metoprolol 50 MG tablet  Commonly known as:  LOPRESSOR  TAKE 2 BY MOUTH EVERY MORNING AND 1 BY MOUTH AT  BEDTIME     omeprazole 40 MG capsule  Commonly known as:  PRILOSEC  Take 1 capsule (40 mg total) by mouth daily.     ONETOUCH DELICA LANCETS 67R Misc  1 each by Does not apply route daily. Test 1X per day and prn - DX 250.02     oxyCODONE-acetaminophen 5-325 MG tablet  Commonly known as:  ROXICET  Take 1 tablet by mouth every 8 (eight) hours as needed for severe pain.     PRODIGY BLOOD GLUCOSE MONITOR w/Device Kit  1 Units by Does not apply route daily. Check blood sugars 1X per day-- dx 250.02     sulfamethoxazole-trimethoprim 800-160 MG tablet  Commonly known as:  BACTRIM DS,SEPTRA DS  Take 1 tablet by mouth 2 (two) times daily.     VITAMIN D PO  Take 1 tablet by mouth every morning.           Objective:    BP 134/83 mmHg  Pulse 77  Temp(Src) 97.1 F  (36.2 C) (Oral)  Ht _0  (1.702 m)  Wt 284 lb 6.4 oz (129.003 kg)  BMI 44.53 kg/m2  Wt Readings from Last 3 Encounters:  03/10/15 284 lb 6.4 oz (129.003 kg)  01/01/15 282 lb 9.6 oz (128.187 kg)  12/23/14 283 lb (128.368 kg)    Physical Exam  Constitutional: He is oriented to person, place, and time. He appears well-developed and well-nourished. No distress.  Eyes: Conjunctivae and EOM are normal. Pupils are equal, round, and reactive to light. Right eye exhibits no discharge. No scleral icterus.  Cardiovascular: Normal rate, regular rhythm, normal heart sounds and intact distal pulses.   No murmur heard. Pulmonary/Chest: Effort normal and breath sounds normal. No respiratory distress. He has no wheezes.  Musculoskeletal: Normal range of motion. He exhibits no edema.       Right hand: He exhibits tenderness (Pain swelling and erythema and warmth overlying the right thenar eminence, pain with range of motion of thumb in every direction.) and swelling. He exhibits normal range of motion, normal capillary refill, no deformity and no laceration. Normal sensation noted. Decreased strength (Decreased strength due to pain) noted.  Neurological: He is alert and oriented to person, place, and time. Coordination normal.  Skin: Skin is warm and dry. No rash noted. He is not diaphoretic.  Psychiatric: He has a normal mood and affect. His behavior is normal.  Nursing note and vitals reviewed.      Assessment & Plan:   Problem List Items Addressed This Visit    None    Visit Diagnoses    Cellulitis and abscess of hand    -  Primary    near base of thumb, possbily started with trigger finger and then swelling and warmth and redness increased.    Relevant Medications    sulfamethoxazole-trimethoprim (BACTRIM DS,SEPTRA DS) 800-160 MG tablet    Other Relevant Orders    Ambulatory referral to Hand Surgery        Follow up plan: Return in about 1 week (around 03/17/2015), or if symptoms worsen or  fail to improve, for f/u cellulitis.  Counseling provided for all of the vaccine components Orders Placed This Encounter  Procedures  . Ambulatory referral to [place], MD [facility] 03/10/2015, 4:44 PM

## 2015-03-14 DIAGNOSIS — M65311 Trigger thumb, right thumb: Secondary | ICD-10-CM | POA: Diagnosis not present

## 2015-03-17 ENCOUNTER — Ambulatory Visit: Payer: PPO | Admitting: Family Medicine

## 2015-03-24 ENCOUNTER — Ambulatory Visit (INDEPENDENT_AMBULATORY_CARE_PROVIDER_SITE_OTHER): Payer: PPO | Admitting: Family Medicine

## 2015-03-24 ENCOUNTER — Encounter: Payer: Self-pay | Admitting: Family Medicine

## 2015-03-24 VITALS — BP 136/87 | HR 78 | Temp 97.5°F | Ht 67.0 in | Wt 280.6 lb

## 2015-03-24 DIAGNOSIS — J029 Acute pharyngitis, unspecified: Secondary | ICD-10-CM

## 2015-03-24 DIAGNOSIS — J4531 Mild persistent asthma with (acute) exacerbation: Secondary | ICD-10-CM

## 2015-03-24 DIAGNOSIS — R52 Pain, unspecified: Secondary | ICD-10-CM | POA: Diagnosis not present

## 2015-03-24 LAB — POCT INFLUENZA A/B
Influenza A, POC: NEGATIVE
Influenza B, POC: NEGATIVE

## 2015-03-24 MED ORDER — METHYLPREDNISOLONE ACETATE 80 MG/ML IJ SUSP
80.0000 mg | Freq: Once | INTRAMUSCULAR | Status: AC
  Administered 2015-03-24: 80 mg via INTRAMUSCULAR

## 2015-03-24 MED ORDER — PREDNISONE 20 MG PO TABS: ORAL_TABLET | ORAL | Status: DC

## 2015-03-24 MED ORDER — AZITHROMYCIN 250 MG PO TABS: ORAL_TABLET | ORAL | Status: DC

## 2015-03-24 MED ORDER — CEFTRIAXONE SODIUM 1 G IJ SOLR
1.0000 g | Freq: Once | INTRAMUSCULAR | Status: AC
  Administered 2015-03-24: 1 g via INTRAMUSCULAR

## 2015-03-24 MED ORDER — ALBUTEROL SULFATE HFA 108 (90 BASE) MCG/ACT IN AERS: 2.0000 | INHALATION_SPRAY | Freq: Four times a day (QID) | RESPIRATORY_TRACT | Status: DC | PRN

## 2015-03-24 MED ORDER — FLUTICASONE-SALMETEROL 100-50 MCG/DOSE IN AEPB: 1.0000 | INHALATION_SPRAY | Freq: Two times a day (BID) | RESPIRATORY_TRACT | Status: DC

## 2015-03-24 NOTE — Progress Notes (Signed)
BP 136/87 mmHg  Pulse 78  Temp(Src) 97.5 F (36.4 C) (Oral)  Ht _0  (1.702 m)  Wt 280 lb 9.6 oz (127.279 kg)  BMI 43.94 kg/m2   Subjective:    Patient ID: Kenneth Fuller, male    DOB: 19-Nov-1945, 70 y.o.   MRN: [id number]  HPI: Kenneth Fuller is a 70 y.o. male presenting on 03/24/2015 for Cough; Bronchitis; Fever & Chills; and Generalized Body Aches   HPI Cough and wheezing and generalized body aches Patient has had 3 days of cough and chest congestion and wheezing and postnasal drainage and sinus congestion and fevers and chills and body aches. He feels like it is getting worse over the past day. He has been using Advair the dose that he had previously but does not have a rescue inhaler currently. He does complain of the wheezing and coughing being worse at night. His cough is productive of yellow-green sputum. He denies any sick contacts that he knows of. He does have some minimal amount of shortness of breath and significant amount of wheezing. He has tried some over-the-counter Sudafed without much success.  Relevant past medical, surgical, family and social history reviewed and updated as indicated. Interim medical history since our last visit reviewed. Allergies and medications reviewed and updated.  Review of Systems  Constitutional: Positive for fever and chills.  HENT: Positive for congestion, postnasal drip, rhinorrhea, sinus pressure, sneezing and sore throat. Negative for ear discharge, ear pain and voice change.   Eyes: Negative for pain, discharge, redness and visual disturbance.  Respiratory: Positive for cough, chest tightness, shortness of breath and wheezing.   Cardiovascular: Negative for chest pain and leg swelling.  Gastrointestinal: Negative for abdominal pain, diarrhea and constipation.  Genitourinary: Negative for difficulty urinating.  Musculoskeletal: Negative for back pain and gait problem.  Skin: Negative for rash.  Neurological: Negative for  syncope, light-headedness and headaches.  All other systems reviewed and are negative.   Per HPI unless specifically indicated above     Medication List       This list is accurate as of: 03/24/15  7:03 PM.  Always use your most recent med list.               albuterol 108 (90 Base) MCG/ACT inhaler  Commonly known as:  PROVENTIL HFA;VENTOLIN HFA  Inhale 2 puffs into the lungs every 6 (six) hours as needed for wheezing or shortness of breath.     allopurinol 100 MG tablet  Commonly known as:  ZYLOPRIM  100 mg as needed.     amLODipine 10 MG tablet  Commonly known as:  NORVASC  TAKE 1/2 (5MG) BY MOUTH DAILY --MARY-MARGARET MARTIN NP     atorvastatin 40 MG tablet  Commonly known as:  LIPITOR  Take 40 mg by mouth daily.     azithromycin 250 MG tablet  Commonly known as:  ZITHROMAX  Take 2 the first day and then one each day after.     Fluticasone-Salmeterol 100-50 MCG/DOSE Aepb  Commonly known as:  ADVAIR  Inhale 1 puff into the lungs 2 (two) times daily.     furosemide 40 MG tablet  Commonly known as:  LASIX  TAKE 1 BY MOUTH TWICE DAILY --MARY-MARGARET MARTIN     glucose blood test strip  Test 1X per day and prn- Dx Code E11.9. One Touch Ultra Test Strips     ibuprofen 800 MG tablet  Commonly known as:  ADVIL,MOTRIN  TAKE 1 (800MG  TOTAL) BY MOUTH EVERY 8 HOURS AS NEEDED     KLOR-CON M10 10 MEQ tablet  Generic drug:  potassium chloride  TAKE ONE TABLET BY MOUTH ONCE DAILY     metFORMIN 1000 MG tablet  Commonly known as:  GLUCOPHAGE  Take 1 tablet (1,000 mg total) by mouth 2 (two) times daily with a meal.     metoprolol 50 MG tablet  Commonly known as:  LOPRESSOR  TAKE 2 BY MOUTH EVERY MORNING AND 1 BY MOUTH AT BEDTIME     omeprazole 40 MG capsule  Commonly known as:  PRILOSEC  Take 1 capsule (40 mg total) by mouth daily.     ONETOUCH DELICA LANCETS 18E Misc  1 each by Does not apply route daily. Test 1X per day and prn - DX 250.02      oxyCODONE-acetaminophen 5-325 MG tablet  Commonly known as:  ROXICET  Take 1 tablet by mouth every 8 (eight) hours as needed for severe pain.     phenylephrine 10 MG Tabs tablet  Commonly known as:  SUDAFED PE  Take 10 mg by mouth every 4 (four) hours as needed.     predniSONE 20 MG tablet  Commonly known as:  DELTASONE  2 po at same time daily for 5 days     PRODIGY BLOOD GLUCOSE MONITOR w/Device Kit  1 Units by Does not apply route daily. Check blood sugars 1X per day-- dx 250.02     VITAMIN D PO  Take 1 tablet by mouth every morning.           Objective:    BP 136/87 mmHg  Pulse 78  Temp(Src) 97.5 F (36.4 C) (Oral)  Ht _0  (1.702 m)  Wt 280 lb 9.6 oz (127.279 kg)  BMI 43.94 kg/m2  Wt Readings from Last 3 Encounters:  03/24/15 280 lb 9.6 oz (127.279 kg)  03/10/15 284 lb 6.4 oz (129.003 kg)  01/01/15 282 lb 9.6 oz (128.187 kg)    Physical Exam  Constitutional: He is oriented to person, place, and time. He appears well-developed and well-nourished. No distress.  HENT:  Right Ear: Tympanic membrane, external ear and ear canal normal.  Left Ear: Tympanic membrane, external ear and ear canal normal.  Nose: Mucosal edema and rhinorrhea present. No sinus tenderness. No epistaxis. Right sinus exhibits maxillary sinus tenderness. Right sinus exhibits no frontal sinus tenderness. Left sinus exhibits maxillary sinus tenderness. Left sinus exhibits no frontal sinus tenderness.  Mouth/Throat: Uvula is midline and mucous membranes are normal. Posterior oropharyngeal edema and posterior oropharyngeal erythema present. No oropharyngeal exudate or tonsillar abscesses.  Eyes: Conjunctivae and EOM are normal. Pupils are equal, round, and reactive to light. Right eye exhibits no discharge. No scleral icterus.  Neck: Neck supple. No thyromegaly present.  Cardiovascular: Normal rate, regular rhythm, normal heart sounds and intact distal pulses.   No murmur heard. Pulmonary/Chest: Effort  normal. No respiratory distress. He has wheezes. He has no rales.  Musculoskeletal: Normal range of motion. He exhibits no edema.  Lymphadenopathy:    He has no cervical adenopathy.  Neurological: He is alert and oriented to person, place, and time. Coordination normal.  Skin: Skin is warm and dry. No rash noted. He is not diaphoretic.  Psychiatric: He has a normal mood and affect. His behavior is normal.  Vitals reviewed.   Results for orders placed or performed in visit on 03/24/15  POCT Influenza A/B  Result Value Ref Range   Influenza A, POC Negative  Negative   Influenza B, POC Negative Negative      Assessment & Plan:   Problem List Items Addressed This Visit    None    Visit Diagnoses    Body aches    -  Primary    Relevant Medications    cefTRIAXone (ROCEPHIN) injection 1 g (Completed)    methylPREDNISolone acetate (DEPO-MEDROL) injection 80 mg (Completed)    Other Relevant Orders    POCT Influenza A/B (Completed)    Acute pharyngitis, unspecified etiology        Relevant Medications    cefTRIAXone (ROCEPHIN) injection 1 g (Completed)    methylPREDNISolone acetate (DEPO-MEDROL) injection 80 mg (Completed)    Asthma with acute exacerbation, mild persistent        Relevant Medications    albuterol (PROVENTIL HFA;VENTOLIN HFA) 108 (90 Base) MCG/ACT inhaler    Fluticasone-Salmeterol (ADVAIR) 100-50 MCG/DOSE AEPB    cefTRIAXone (ROCEPHIN) injection 1 g (Completed)    methylPREDNISolone acetate (DEPO-MEDROL) injection 80 mg (Completed)    azithromycin (ZITHROMAX) 250 MG tablet    predniSONE (DELTASONE) 20 MG tablet    Other Relevant Orders    POCT Influenza A/B (Completed)        Follow up plan: Return if symptoms worsen or fail to improve.  Counseling provided for all of the vaccine components Orders Placed This Encounter  Procedures  . POCT Influenza A/B    Caryl Pina, MD [facility] 03/24/2015, 7:03 PM

## 2015-04-23 ENCOUNTER — Other Ambulatory Visit: Payer: Self-pay | Admitting: Family Medicine

## 2015-04-23 DIAGNOSIS — J4531 Mild persistent asthma with (acute) exacerbation: Secondary | ICD-10-CM

## 2015-04-23 MED ORDER — METOPROLOL TARTRATE 50 MG PO TABS: ORAL_TABLET | ORAL | Status: DC

## 2015-04-23 MED ORDER — FUROSEMIDE 40 MG PO TABS: ORAL_TABLET | ORAL | Status: DC

## 2015-04-23 MED ORDER — FLUTICASONE-SALMETEROL 100-50 MCG/DOSE IN AEPB: 1.0000 | INHALATION_SPRAY | Freq: Two times a day (BID) | RESPIRATORY_TRACT | Status: DC

## 2015-04-23 MED ORDER — POTASSIUM CHLORIDE CRYS ER 10 MEQ PO TBCR: 10.0000 meq | EXTENDED_RELEASE_TABLET | Freq: Every day | ORAL | Status: DC

## 2015-04-23 MED ORDER — ATORVASTATIN CALCIUM 40 MG PO TABS: 40.0000 mg | ORAL_TABLET | Freq: Every day | ORAL | Status: DC

## 2015-04-23 MED ORDER — METFORMIN HCL 1000 MG PO TABS: 1000.0000 mg | ORAL_TABLET | Freq: Two times a day (BID) | ORAL | Status: DC

## 2015-04-23 MED ORDER — ALBUTEROL SULFATE HFA 108 (90 BASE) MCG/ACT IN AERS: 2.0000 | INHALATION_SPRAY | Freq: Four times a day (QID) | RESPIRATORY_TRACT | Status: DC | PRN

## 2015-04-23 MED ORDER — OMEPRAZOLE 40 MG PO CPDR: 40.0000 mg | DELAYED_RELEASE_CAPSULE | Freq: Every day | ORAL | Status: DC

## 2015-04-23 MED ORDER — AMLODIPINE BESYLATE 10 MG PO TABS: ORAL_TABLET | ORAL | Status: DC

## 2015-04-23 NOTE — Telephone Encounter (Signed)
Prescriptions sent to [facility] per patient's request, with the exception of oxycodone- advised patient he will need to discuss this refill with Dr. Warrick Parisian at the appointment scheduled for him on 05/02/15.  Patient states he does not need a refill on oxycodone at this time.

## 2015-05-02 ENCOUNTER — Encounter: Payer: Self-pay | Admitting: Family Medicine

## 2015-05-02 ENCOUNTER — Ambulatory Visit (INDEPENDENT_AMBULATORY_CARE_PROVIDER_SITE_OTHER): Payer: PPO | Admitting: Family Medicine

## 2015-05-02 VITALS — BP 157/94 | HR 72 | Temp 97.5°F | Ht 67.0 in | Wt 278.8 lb

## 2015-05-02 DIAGNOSIS — R195 Other fecal abnormalities: Secondary | ICD-10-CM | POA: Diagnosis not present

## 2015-05-02 DIAGNOSIS — J4531 Mild persistent asthma with (acute) exacerbation: Secondary | ICD-10-CM | POA: Diagnosis not present

## 2015-05-02 DIAGNOSIS — R1013 Epigastric pain: Secondary | ICD-10-CM

## 2015-05-02 LAB — FINGERSTICK HEMOGLOBIN: HEMOGLOBIN: 15.5 g/dL (ref 12.6–17.7)

## 2015-05-02 MED ORDER — AMLODIPINE BESYLATE 10 MG PO TABS: ORAL_TABLET | ORAL | Status: DC

## 2015-05-02 MED ORDER — ALLOPURINOL 100 MG PO TABS: 100.0000 mg | ORAL_TABLET | Freq: Every day | ORAL | Status: DC

## 2015-05-02 MED ORDER — METOPROLOL TARTRATE 50 MG PO TABS: ORAL_TABLET | ORAL | Status: DC

## 2015-05-02 MED ORDER — FUROSEMIDE 40 MG PO TABS: ORAL_TABLET | ORAL | Status: DC

## 2015-05-02 MED ORDER — OXYCODONE-ACETAMINOPHEN 5-325 MG PO TABS: 1.0000 | ORAL_TABLET | Freq: Three times a day (TID) | ORAL | Status: DC | PRN

## 2015-05-02 MED ORDER — ALBUTEROL SULFATE HFA 108 (90 BASE) MCG/ACT IN AERS: 2.0000 | INHALATION_SPRAY | Freq: Four times a day (QID) | RESPIRATORY_TRACT | Status: DC | PRN

## 2015-05-02 MED ORDER — FLUTICASONE-SALMETEROL 100-50 MCG/DOSE IN AEPB: 1.0000 | INHALATION_SPRAY | Freq: Two times a day (BID) | RESPIRATORY_TRACT | Status: DC

## 2015-05-02 MED ORDER — ATORVASTATIN CALCIUM 40 MG PO TABS: 40.0000 mg | ORAL_TABLET | Freq: Every day | ORAL | Status: DC

## 2015-05-02 MED ORDER — METFORMIN HCL 1000 MG PO TABS: 1000.0000 mg | ORAL_TABLET | Freq: Two times a day (BID) | ORAL | Status: DC

## 2015-05-02 MED ORDER — DEXLANSOPRAZOLE 60 MG PO CPDR: 60.0000 mg | DELAYED_RELEASE_CAPSULE | Freq: Every day | ORAL | Status: DC

## 2015-05-02 NOTE — Patient Instructions (Signed)
Thank you for allowing [place] to care for you today. We strive to provide exceptional quality and compassionate care. Please let us know how we are doing and how we can help serve you better by filling out the survey that you receive from [facility].

## 2015-05-02 NOTE — Progress Notes (Signed)
BP 157/94 mmHg  Pulse 72  Temp(Src) 97.5 F (36.4 C) (Oral)  Ht _0  (1.702 m)  Wt 278 lb 12.8 oz (126.463 kg)  BMI 43.66 kg/m2   Subjective:    Patient ID: Kenneth Fuller, male    DOB: 27-May-1945, 70 y.o.   MRN: [id number]  HPI: Kenneth Fuller is a 69 y.o. male presenting on 05/02/2015 for Follow-up and Abdominal Pain   HPI Epigastric abdominal pain and dark tarry stools Patient has been having epigastric abdominal pain and has had 2 episodes this week with black loose stools associated. Is also been having belching and nausea and decreased appetite associated with it. The pain does not radiate anywhere else. The black loose stools have happened twice in the last week and he has not had this previously. He has been on the omeprazole for his reflux for quite some time and feels like it is helping decrease the burning but not getting rid of the indigestion or the belching. He still has a lot of acid in metallic taste in his mouth when he lays down. Because of that he has started sleeping sitting up.  Asthma recheck Patient is coming in for refill of his asthma medications. He feels like he is doing very well on his asthma medications and rarely has to use his albuterol but once or twice a month. He denies any nighttime episodes of coughing or wheezing currently. He does have exacerbations over the winter but does better through the spring and summer. he continues to take Advair twice a day.  Relevant past medical, surgical, family and social history reviewed and updated as indicated. Interim medical history since our last visit reviewed. Allergies and medications reviewed and updated.  Review of Systems  Constitutional: Negative for fever and chills.  HENT: Negative for ear discharge and ear pain.   Eyes: Negative for discharge and visual disturbance.  Respiratory: Positive for cough (intermittent). Negative for shortness of breath and wheezing.   Cardiovascular: Negative for  chest pain and leg swelling.  Gastrointestinal: Positive for nausea, abdominal pain, diarrhea and blood in stool. Negative for vomiting, constipation and rectal pain.  Genitourinary: Negative for frequency, hematuria, flank pain and difficulty urinating.  Musculoskeletal: Negative for back pain and gait problem.  Skin: Negative for rash.  Neurological: Negative for dizziness, syncope, light-headedness and headaches.  All other systems reviewed and are negative.   Per HPI unless specifically indicated above     Medication List       This list is accurate as of: 05/02/15 10:47 AM.  Always use your most recent med list.               albuterol 108 (90 Base) MCG/ACT inhaler  Commonly known as:  PROVENTIL HFA;VENTOLIN HFA  Inhale 2 puffs into the lungs every 6 (six) hours as needed for wheezing or shortness of breath.     allopurinol 100 MG tablet  Commonly known as:  ZYLOPRIM  Take 1 tablet (100 mg total) by mouth daily.     amLODipine 10 MG tablet  Commonly known as:  NORVASC  TAKE 1/2 (5MG) BY MOUTH DAILY     atorvastatin 40 MG tablet  Commonly known as:  LIPITOR  Take 1 tablet (40 mg total) by mouth daily.     dexlansoprazole 60 MG capsule  Commonly known as:  DEXILANT  Take 1 capsule (60 mg total) by mouth daily.     Fluticasone-Salmeterol 100-50 MCG/DOSE Aepb  Commonly known as:  ADVAIR  Inhale 1 puff into the lungs 2 (two) times daily.     furosemide 40 MG tablet  Commonly known as:  LASIX  TAKE 1 BY MOUTH TWICE DAILY     glucose blood test strip  Test 1X per day and prn- Dx Code E11.9. One Touch Ultra Test Strips     ibuprofen 800 MG tablet  Commonly known as:  ADVIL,MOTRIN  TAKE 1 (800MG TOTAL) BY MOUTH EVERY 8 HOURS AS NEEDED     metFORMIN 1000 MG tablet  Commonly known as:  GLUCOPHAGE  Take 1 tablet (1,000 mg total) by mouth 2 (two) times daily with a meal.     metoprolol 50 MG tablet  Commonly known as:  LOPRESSOR  TAKE 2 BY MOUTH EVERY MORNING AND  1 BY MOUTH AT BEDTIME     omeprazole 40 MG capsule  Commonly known as:  PRILOSEC  Take 1 capsule (40 mg total) by mouth daily.     ONETOUCH DELICA LANCETS 50K Misc  1 each by Does not apply route daily. Test 1X per day and prn - DX 250.02     oxyCODONE-acetaminophen 5-325 MG tablet  Commonly known as:  ROXICET  Take 1 tablet by mouth every 8 (eight) hours as needed for severe pain.     phenylephrine 10 MG Tabs tablet  Commonly known as:  SUDAFED PE  Take 10 mg by mouth every 4 (four) hours as needed.     potassium chloride 10 MEQ tablet  Commonly known as:  KLOR-CON M10  Take 1 tablet (10 mEq total) by mouth daily.     PRODIGY BLOOD GLUCOSE MONITOR w/Device Kit  1 Units by Does not apply route daily. Check blood sugars 1X per day-- dx 250.02     VITAMIN D PO  Take 1 tablet by mouth every morning.           Objective:    BP 157/94 mmHg  Pulse 72  Temp(Src) 97.5 F (36.4 C) (Oral)  Ht _0  (1.702 m)  Wt 278 lb 12.8 oz (126.463 kg)  BMI 43.66 kg/m2  Wt Readings from Last 3 Encounters:  05/02/15 278 lb 12.8 oz (126.463 kg)  03/24/15 280 lb 9.6 oz (127.279 kg)  03/10/15 284 lb 6.4 oz (129.003 kg)    Physical Exam  Constitutional: He is oriented to person, place, and time. He appears well-developed and well-nourished. No distress.  Eyes: Conjunctivae and EOM are normal. Pupils are equal, round, and reactive to light. Right eye exhibits no discharge. No scleral icterus.  Neck: Neck supple. No thyromegaly present.  Cardiovascular: Normal rate, regular rhythm, normal heart sounds and intact distal pulses.   No murmur heard. Pulmonary/Chest: Effort normal and breath sounds normal. No respiratory distress. He has no wheezes.  Abdominal: Soft. Bowel sounds are normal. He exhibits no distension, no abdominal bruit and no mass. There is tenderness in the epigastric area. There is no rigidity, no rebound, no guarding, no CVA tenderness, no tenderness at McBurney's point and  negative Murphy's sign.  Musculoskeletal: Normal range of motion. He exhibits no edema.  Lymphadenopathy:    He has no cervical adenopathy.  Neurological: He is alert and oriented to person, place, and time. Coordination normal.  Skin: Skin is warm and dry. No rash noted. He is not diaphoretic.  Psychiatric: He has a normal mood and affect. His behavior is normal.  Vitals reviewed.     Assessment & Plan:   Problem List Items Addressed This Visit  None    Visit Diagnoses    Abdominal pain, epigastric    -  Primary    Relevant Medications    dexlansoprazole (DEXILANT) 60 MG capsule    Other Relevant Orders    CBC with Differential/Platelet (Completed)    Lipase (Completed)    CMP14+EGFR (Completed)    Fecal occult blood, imunochemical    GI COCKTAIL UP TO 45 CC    Ambulatory referral to Gastroenterology    Fingerstick Hemoglobin (Completed)    Dark stools        Relevant Medications    dexlansoprazole (DEXILANT) 60 MG capsule    Other Relevant Orders    CBC with Differential/Platelet (Completed)    Lipase (Completed)    CMP14+EGFR (Completed)    Fecal occult blood, imunochemical    GI COCKTAIL UP TO 45 CC    Ambulatory referral to Gastroenterology    Fingerstick Hemoglobin (Completed)    Asthma with acute exacerbation, mild persistent        Relevant Medications    Fluticasone-Salmeterol (ADVAIR) 100-50 MCG/DOSE AEPB    albuterol (PROVENTIL HFA;VENTOLIN HFA) 108 (90 Base) MCG/ACT inhaler       Follow up plan: Return in about 2 weeks (around 05/16/2015), or if symptoms worsen or fail to improve, for Follow-up GERD.  Counseling provided for all of the vaccine components Orders Placed This Encounter  Procedures  . Fecal occult blood, imunochemical  . CBC with Differential/Platelet  . Lipase  . CMP14+EGFR  . Fingerstick Hemoglobin  . Ambulatory referral to Gastroenterology  . GI COCKTAIL UP TO 72 CC    Caryl Pina, MD [facility] 05/02/2015, 10:47 AM

## 2015-05-03 LAB — LIPASE: LIPASE: 50 U/L (ref 0–59)

## 2015-05-03 LAB — CBC WITH DIFFERENTIAL/PLATELET
BASOS ABS: 0.1 10*3/uL (ref 0.0–0.2)
Basos: 1 %
EOS (ABSOLUTE): 0.2 10*3/uL (ref 0.0–0.4)
Eos: 2 %
Hematocrit: 45 % (ref 37.5–51.0)
Hemoglobin: 15.1 g/dL (ref 12.6–17.7)
IMMATURE GRANS (ABS): 0 10*3/uL (ref 0.0–0.1)
Immature Granulocytes: 0 %
LYMPHS: 26 %
Lymphocytes Absolute: 2.5 10*3/uL (ref 0.7–3.1)
MCH: 27.5 pg (ref 26.6–33.0)
MCHC: 33.6 g/dL (ref 31.5–35.7)
MCV: 82 fL (ref 79–97)
MONOCYTES: 8 %
Monocytes Absolute: 0.8 10*3/uL (ref 0.1–0.9)
NEUTROS ABS: 6.2 10*3/uL (ref 1.4–7.0)
NEUTROS PCT: 63 %
PLATELETS: 267 10*3/uL (ref 150–379)
RBC: 5.5 x10E6/uL (ref 4.14–5.80)
RDW: 15.4 % (ref 12.3–15.4)
WBC: 9.8 10*3/uL (ref 3.4–10.8)

## 2015-05-03 LAB — CMP14+EGFR
A/G RATIO: 1.7 (ref 1.2–2.2)
ALT: 30 IU/L (ref 0–44)
AST: 21 IU/L (ref 0–40)
Albumin: 4.4 g/dL (ref 3.6–4.8)
Alkaline Phosphatase: 64 IU/L (ref 39–117)
BILIRUBIN TOTAL: 0.3 mg/dL (ref 0.0–1.2)
BUN/Creatinine Ratio: 16 (ref 10–22)
BUN: 13 mg/dL (ref 8–27)
CALCIUM: 9.8 mg/dL (ref 8.6–10.2)
CHLORIDE: 95 mmol/L — AB (ref 96–106)
CO2: 25 mmol/L (ref 18–29)
Creatinine, Ser: 0.81 mg/dL (ref 0.76–1.27)
GFR calc Af Amer: 106 mL/min/{1.73_m2} (ref >59)
GFR calc non Af Amer: 91 mL/min/{1.73_m2} (ref >59)
GLUCOSE: 171 mg/dL — AB (ref 65–99)
Globulin, Total: 2.6 g/dL (ref 1.5–4.5)
POTASSIUM: 4.5 mmol/L (ref 3.5–5.2)
Sodium: 140 mmol/L (ref 134–144)
Total Protein: 7 g/dL (ref 6.0–8.5)

## 2015-05-09 ENCOUNTER — Other Ambulatory Visit: Payer: PPO

## 2015-05-09 DIAGNOSIS — R195 Other fecal abnormalities: Secondary | ICD-10-CM

## 2015-05-09 DIAGNOSIS — R1013 Epigastric pain: Secondary | ICD-10-CM

## 2015-05-12 ENCOUNTER — Other Ambulatory Visit: Payer: Self-pay

## 2015-05-13 LAB — FECAL OCCULT BLOOD, IMMUNOCHEMICAL: FECAL OCCULT BLD: NEGATIVE

## 2015-05-15 ENCOUNTER — Encounter: Payer: Self-pay | Admitting: *Deleted

## 2015-05-15 ENCOUNTER — Encounter (INDEPENDENT_AMBULATORY_CARE_PROVIDER_SITE_OTHER): Payer: Self-pay

## 2015-05-15 ENCOUNTER — Ambulatory Visit (INDEPENDENT_AMBULATORY_CARE_PROVIDER_SITE_OTHER): Payer: PPO

## 2015-05-15 ENCOUNTER — Encounter: Payer: Self-pay | Admitting: Family

## 2015-05-15 ENCOUNTER — Ambulatory Visit (INDEPENDENT_AMBULATORY_CARE_PROVIDER_SITE_OTHER): Payer: PPO | Admitting: Family

## 2015-05-15 VITALS — BP 149/96 | HR 84 | Temp 97.7°F | Ht 67.0 in | Wt 282.0 lb

## 2015-05-15 DIAGNOSIS — R059 Cough, unspecified: Secondary | ICD-10-CM

## 2015-05-15 DIAGNOSIS — R0602 Shortness of breath: Secondary | ICD-10-CM

## 2015-05-15 DIAGNOSIS — J4520 Mild intermittent asthma, uncomplicated: Secondary | ICD-10-CM

## 2015-05-15 DIAGNOSIS — J0101 Acute recurrent maxillary sinusitis: Secondary | ICD-10-CM | POA: Diagnosis not present

## 2015-05-15 DIAGNOSIS — R05 Cough: Secondary | ICD-10-CM

## 2015-05-15 MED ORDER — PREDNISONE 10 MG (21) PO TBPK: 10.0000 mg | ORAL_TABLET | Freq: Every day | ORAL | Status: DC

## 2015-05-15 MED ORDER — BENZONATATE 200 MG PO CAPS: 200.0000 mg | ORAL_CAPSULE | Freq: Three times a day (TID) | ORAL | Status: DC | PRN

## 2015-05-15 MED ORDER — AMOXICILLIN-POT CLAVULANATE 875-125 MG PO TABS: 1.0000 | ORAL_TABLET | Freq: Two times a day (BID) | ORAL | Status: DC

## 2015-05-15 MED ORDER — HYDROCODONE-HOMATROPINE 5-1.5 MG/5ML PO SYRP: 5.0000 mL | ORAL_SOLUTION | Freq: Three times a day (TID) | ORAL | Status: DC | PRN

## 2015-05-15 MED ORDER — FLUTICASONE PROPIONATE 50 MCG/ACT NA SUSP: 2.0000 | Freq: Every day | NASAL | Status: DC

## 2015-05-15 NOTE — Progress Notes (Signed)
Subjective:    Patient ID: Kenneth Fuller, male    DOB: February 21, 1945, 70 y.o.   MRN: [id number]  Pt presents to the office today for recurrent sinus infection and SOB.  Shortness of Breath Associated symptoms include a fever, headaches, rhinorrhea, a sore throat and wheezing. Pertinent negatives include no ear pain. His past medical history is significant for asthma (as a small child). There is no history of COPD.  Cough This is a new problem. The current episode started 1 to 4 weeks ago. The problem has been unchanged. The problem occurs every few minutes. The cough is non-productive. Associated symptoms include a fever, headaches, myalgias, nasal congestion, postnasal drip, rhinorrhea, a sore throat, shortness of breath and wheezing. Pertinent negatives include no chills, ear congestion or ear pain. The symptoms are aggravated by lying down. He has tried rest and OTC cough suppressant for the symptoms. The treatment provided moderate relief. His past medical history is significant for asthma (as a small child). There is no history of COPD.  Sinus Problem Associated symptoms include coughing, headaches, shortness of breath and a sore throat. Pertinent negatives include no chills or ear pain.      Review of Systems  Constitutional: Positive for fever. Negative for chills.  HENT: Positive for postnasal drip, rhinorrhea and sore throat. Negative for ear pain.   Respiratory: Positive for cough, shortness of breath and wheezing.   Cardiovascular: Negative.   Gastrointestinal: Negative.   Endocrine: Negative.   Genitourinary: Negative.   Musculoskeletal: Positive for myalgias.  Neurological: Positive for headaches.  Hematological: Negative.   Psychiatric/Behavioral: Negative.   All other systems reviewed and are negative.      Objective:   Physical Exam  Constitutional: He is oriented to person, place, and time. He appears well-developed and well-nourished. No distress.  HENT:    Head: Normocephalic.  Right Ear: External ear normal.  Left Ear: External ear normal.  Nose: Right sinus exhibits maxillary sinus tenderness. Left sinus exhibits maxillary sinus tenderness.  Mouth/Throat: Oropharynx is clear and moist.  Nasal passage erythemas with mild swelling    Eyes: Pupils are equal, round, and reactive to light. Right eye exhibits no discharge. Left eye exhibits no discharge.  Neck: Normal range of motion. Neck supple. No thyromegaly present.  Cardiovascular: Normal rate, regular rhythm, normal heart sounds and intact distal pulses.   No murmur heard. Pulmonary/Chest: Effort normal and breath sounds normal. No respiratory distress. He has no wheezes.  Dry constant cough  Abdominal: Soft. Bowel sounds are normal. He exhibits no distension. There is no tenderness.  Musculoskeletal: Normal range of motion. He exhibits no edema or tenderness.  Neurological: He is alert and oriented to person, place, and time.  Skin: Skin is warm and dry. No rash noted. No erythema.  Psychiatric: He has a normal mood and affect. His behavior is normal. Judgment and thought content normal.  Vitals reviewed.   BP 149/96 mmHg  Pulse 84  Temp(Src) 97.7 F (36.5 C) (Oral)  Ht _0  (1.702 m)  Wt 282 lb (127.914 kg)  BMI 44.16 kg/m2  SpO2 95%  Chest x-ray- WNL Preliminary reading by Evelina Dun, FNP [facility]      Assessment & Plan:  1. Shortness of breath - DG Chest 2 View; Future  2. Acute recurrent maxillary sinusitis -- Take meds as prescribed - Use a cool mist humidifier  -Use saline nose sprays frequently -Saline irrigations of the nose can be very helpful if done frequently.  *  4X daily for 1 week*  * Use of a nettie pot can be helpful with this. Follow directions with this* -Force fluids -For any cough or congestion  Use plain Mucinex- regular strength or max strength is fine   * Children- consult with Pharmacist for dosing -For fever or aces or pains- take tylenol  or ibuprofen appropriate for age and weight.  * for fevers greater than 101 orally you may alternate ibuprofen and tylenol every  3 hours. -Throat lozenges if help - amoxicillin-clavulanate (AUGMENTIN) 875-125 MG tablet; Take 1 tablet by mouth 2 (two) times daily.  Dispense: 14 tablet; Refill: 0 - fluticasone (FLONASE) 50 MCG/ACT nasal spray; Place 2 sprays into both nostrils daily.  Dispense: 16 g; Refill: 6 - predniSONE (STERAPRED UNI-PAK 21 TAB) 10 MG (21) TBPK tablet; Take 1 tablet (10 mg total) by mouth daily. As directed x 6 days  Dispense: 21 tablet; Refill: 0  3. Asthma, mild intermittent, uncomplicated -Continue Advair - fluticasone (FLONASE) 50 MCG/ACT nasal spray; Place 2 sprays into both nostrils daily.  Dispense: 16 g; Refill: 6 - predniSONE (STERAPRED UNI-PAK 21 TAB) 10 MG (21) TBPK tablet; Take 1 tablet (10 mg total) by mouth daily. As directed x 6 days  Dispense: 21 tablet; Refill: 0  4. Cough - HYDROcodone-homatropine (HYCODAN) 5-1.5 MG/5ML syrup; Take 5 mLs by mouth every 8 (eight) hours as needed for cough.  Dispense: 120 mL; Refill: 0 - benzonatate (TESSALON) 200 MG capsule; Take 1 capsule (200 mg total) by mouth 3 (three) times daily as needed.  Dispense: 30 capsule; Refill: [place], FNP

## 2015-05-15 NOTE — Patient Instructions (Signed)
Sinusitis, Adult °Sinusitis is redness, soreness, and inflammation of the paranasal sinuses. Paranasal sinuses are air pockets within the bones of your face. They are located beneath your eyes, in the middle of your forehead, and above your eyes. In healthy paranasal sinuses, mucus is able to drain out, and air is able to circulate through them by way of your nose. However, when your paranasal sinuses are inflamed, mucus and air can become trapped. This can allow bacteria and other germs to grow and cause infection. °Sinusitis can develop quickly and last only a short time (acute) or continue over a long period (chronic). Sinusitis that lasts for more than 12 weeks is considered chronic. °CAUSES °Causes of sinusitis include: °· Allergies. °· Structural abnormalities, such as displacement of the cartilage that separates your nostrils (deviated septum), which can decrease the air flow through your nose and sinuses and affect sinus drainage. °· Functional abnormalities, such as when the small hairs (cilia) that line your sinuses and help remove mucus do not work properly or are not present. °SIGNS AND SYMPTOMS °Symptoms of acute and chronic sinusitis are the same. The primary symptoms are pain and pressure around the affected sinuses. Other symptoms include: °· Upper toothache. °· Earache. °· Headache. °· Bad breath. °· Decreased sense of smell and taste. °· A cough, which worsens when you are lying flat. °· Fatigue. °· Fever. °· Thick drainage from your nose, which often is green and may contain pus (purulent). °· Swelling and warmth over the affected sinuses. °DIAGNOSIS °Your health care provider will perform a physical exam. During your exam, your health care provider may perform any of the following to help determine if you have acute sinusitis or chronic sinusitis: °· Look in your nose for signs of abnormal growths in your nostrils (nasal polyps). °· Tap over the affected sinus to check for signs of  infection. °· View the inside of your sinuses using an imaging device that has a light attached (endoscope). °If your health care provider suspects that you have chronic sinusitis, one or more of the following tests may be recommended: °· Allergy tests. °· Nasal culture. A sample of mucus is taken from your nose, sent to a lab, and screened for bacteria. °· Nasal cytology. A sample of mucus is taken from your nose and examined by your health care provider to determine if your sinusitis is related to an allergy. °TREATMENT °Most cases of acute sinusitis are related to a viral infection and will resolve on their own within 10 days. Sometimes, medicines are prescribed to help relieve symptoms of both acute and chronic sinusitis. These may include pain medicines, decongestants, nasal steroid sprays, or saline sprays. °However, for sinusitis related to a bacterial infection, your health care provider will prescribe antibiotic medicines. These are medicines that will help kill the bacteria causing the infection. °Rarely, sinusitis is caused by a fungal infection. In these cases, your health care provider will prescribe antifungal medicine. °For some cases of chronic sinusitis, surgery is needed. Generally, these are cases in which sinusitis recurs more than 3 times per year, despite other treatments. °HOME CARE INSTRUCTIONS °· Drink plenty of water. Water helps thin the mucus so your sinuses can drain more easily. °· Use a humidifier. °· Inhale steam 3-4 times a day (for example, sit in the bathroom with the shower running). °· Apply a warm, moist washcloth to your face 3-4 times a day, or as directed by your health care provider. °· Use saline nasal sprays to help   moisten and clean your sinuses.  Take medicines only as directed by your health care provider.  If you were prescribed either an antibiotic or antifungal medicine, finish it all even if you start to feel better. SEEK IMMEDIATE MEDICAL CARE IF:  You have  increasing pain or severe headaches.  You have nausea, vomiting, or drowsiness.  You have swelling around your face.  You have vision problems.  You have a stiff neck.  You have difficulty breathing.   This information is not intended to replace advice given to you by your health care provider. Make sure you discuss any questions you have with your health care provider.   Document Released: 01/25/2005 Document Revised: 02/15/2014 Document Reviewed: 02/09/2011 Elsevier Interactive Patient Education 2016 [place] meds as prescribed - Use a cool mist humidifier  -Use saline nose sprays frequently -Saline irrigations of the nose can be very helpful if done frequently.  * 4X daily for 1 week*  * Use of a nettie pot can be helpful with this. Follow directions with this* -Force fluids -For any cough or congestion  Use plain Mucinex- regular strength or max strength is fine   * Children- consult with Pharmacist for dosing -For fever or aces or pains- take tylenol or ibuprofen appropriate for age and weight.  * for fevers greater than 101 orally you may alternate ibuprofen and tylenol every  3 hours. -Throat lozenges if help -New toothbrush in 3 days   Evelina Dun, FNP

## 2015-05-16 ENCOUNTER — Telehealth: Payer: Self-pay | Admitting: Family

## 2015-05-16 MED ORDER — GUAIFENESIN-CODEINE 100-10 MG/5ML PO SYRP: 5.0000 mL | ORAL_SOLUTION | Freq: Three times a day (TID) | ORAL | Status: DC | PRN

## 2015-05-16 NOTE — Telephone Encounter (Signed)
RX ready for pick up

## 2015-05-16 NOTE — Telephone Encounter (Signed)
Pt aware

## 2015-05-19 ENCOUNTER — Ambulatory Visit: Payer: PPO | Admitting: Family Medicine

## 2015-06-24 ENCOUNTER — Other Ambulatory Visit (INDEPENDENT_AMBULATORY_CARE_PROVIDER_SITE_OTHER): Payer: PPO

## 2015-06-24 ENCOUNTER — Ambulatory Visit (INDEPENDENT_AMBULATORY_CARE_PROVIDER_SITE_OTHER): Payer: PPO | Admitting: Gastroenterology

## 2015-06-24 ENCOUNTER — Encounter: Payer: Self-pay | Admitting: Gastroenterology

## 2015-06-24 VITALS — BP 114/60 | HR 76 | Ht 66.0 in | Wt 284.2 lb

## 2015-06-24 DIAGNOSIS — K921 Melena: Secondary | ICD-10-CM | POA: Diagnosis not present

## 2015-06-24 DIAGNOSIS — K625 Hemorrhage of anus and rectum: Secondary | ICD-10-CM

## 2015-06-24 LAB — CBC WITH DIFFERENTIAL/PLATELET
BASOS PCT: 0.4 % (ref 0.0–3.0)
Basophils Absolute: 0 10*3/uL (ref 0.0–0.1)
EOS PCT: 2 % (ref 0.0–5.0)
Eosinophils Absolute: 0.2 10*3/uL (ref 0.0–0.7)
HEMATOCRIT: 41.9 % (ref 39.0–52.0)
HEMOGLOBIN: 13.8 g/dL (ref 13.0–17.0)
Lymphocytes Relative: 24.6 % (ref 12.0–46.0)
Lymphs Abs: 2.8 10*3/uL (ref 0.7–4.0)
MCHC: 33.1 g/dL (ref 30.0–36.0)
MCV: 81.8 fl (ref 78.0–100.0)
MONOS PCT: 6.6 % (ref 3.0–12.0)
Monocytes Absolute: 0.7 10*3/uL (ref 0.1–1.0)
Neutro Abs: 7.6 10*3/uL (ref 1.4–7.7)
Neutrophils Relative %: 66.4 % (ref 43.0–77.0)
Platelets: 265 10*3/uL (ref 150.0–400.0)
RBC: 5.11 Mil/uL (ref 4.22–5.81)
RDW: 15.8 % — ABNORMAL HIGH (ref 11.5–15.5)
WBC: 11.4 10*3/uL — AB (ref 4.0–10.5)

## 2015-06-24 LAB — BASIC METABOLIC PANEL
BUN: 14 mg/dL (ref 6–23)
CALCIUM: 9 mg/dL (ref 8.4–10.5)
CO2: 30 meq/L (ref 19–32)
CREATININE: 0.83 mg/dL (ref 0.40–1.50)
Chloride: 100 mEq/L (ref 96–112)
GFR: 97.6 mL/min (ref >60.00)
GLUCOSE: 208 mg/dL — AB (ref 70–99)
Potassium: 3.9 mEq/L (ref 3.5–5.1)
SODIUM: 140 meq/L (ref 135–145)

## 2015-06-24 MED ORDER — NA SULFATE-K SULFATE-MG SULF 17.5-3.13-1.6 GM/177ML PO SOLN: 1.0000 | Freq: Once | ORAL | Status: DC

## 2015-06-24 NOTE — Patient Instructions (Addendum)
You will be set up for an upper endoscopy and colonoscopy (for melena and red rectal bleeding).  Miralax/gatorade split dose prep. You will have labs checked today in the basement lab.  Please head down after you check out with the front desk  (cbc, bmet).

## 2015-06-24 NOTE — Progress Notes (Signed)
Review of pertinent gastrointestinal problems: 1. Adenomatous polyp 2008, small; repeat colonoscopy 01/2011 for minor rectal bleeding found small HP, hemorrhoids; was told to have recall in 5 years. 2. EGD normal 2008 for chest discomfort.   3. Intermittent abd pains 01/2012  CT, [place], CBC, cmet all essentially normal.  EGD Dr. Ardis Hughs 02/2012 showed mild gastritis, H. plori neg  HPI: This is a    very pleasant 70 year old man    who was referred to me by [name], [name], MD  to evaluate  melena, nausea, bright red blood per rectum .    Chief complaint is melena, nausea, bright red blood per rectum  Several weeks ago he had very messy, black colored loose stools. Over about a week, 5 dark loose stools.  At that time he was having intermittent abd pains.  Feels nauseas at times. PAins occur daily he's had this for a long time but probably a bit more sensitive.    No NSAIDs  Does not take peptobismol.  Overall his weight has been stable  He has intermittent mild bright red blood per rectum and burning sensation in his anus. He feels he has a lump at his bottom. Indeed I found hemorrhoids in 2012 colonoscopy.  CBC, FOBT testing when he presented to his primary care physician 6 weeks ago were normal.  Review of systems: Pertinent positive and negative review of systems were noted in the above HPI section. Complete review of systems was performed and was otherwise normal.   Past Medical History  Diagnosis Date  . MRSA cellulitis   . Diabetes mellitus   . Hypogonadism male   . Obesity   . Hypertension   . Hyperlipidemia   . GI bleeding   . Fibromyalgia   . Gout   . CAD (coronary artery disease)   . Neuropathy ([place])   . Sleep apnea     cpap- 14   . Shortness of breath dyspnea     with exertion   . GERD (gastroesophageal reflux disease)     Past Surgical History  Procedure Laterality Date  . Neck fusion    . Back surgery    . Colonoscopy    . Cardiac catheterization    .  Lumbar laminectomy/decompression microdiscectomy N/A 01/02/2014    Procedure: CENTRAL DECOMPRESSION LUMBAR LAMINECTOMY L3-L4, L4-L5;  Surgeon: Tobi Bastos, MD;  Location: [facility];  Service: Orthopedics;  Laterality: N/A;    Current Outpatient Prescriptions  Medication Sig Dispense Refill  . albuterol (PROVENTIL HFA;VENTOLIN HFA) 108 (90 Base) MCG/ACT inhaler Inhale 2 puffs into the lungs every 6 (six) hours as needed for wheezing or shortness of breath. 1 Inhaler 2  . allopurinol (ZYLOPRIM) 100 MG tablet Take 1 tablet (100 mg total) by mouth daily. 30 tablet 2  . amLODipine (NORVASC) 10 MG tablet TAKE 1/2 (5MG) BY MOUTH DAILY 45 tablet 0  . atorvastatin (LIPITOR) 40 MG tablet Take 1 tablet (40 mg total) by mouth daily. 90 tablet 0  . Blood Glucose Monitoring Suppl (PRODIGY BLOOD GLUCOSE MONITOR) W/DEVICE KIT 1 Units by Does not apply route daily. Check blood sugars 1X per day-- dx 250.02 1 each 0  . Cholecalciferol (VITAMIN D PO) Take 1 tablet by mouth every morning.    [name] dexlansoprazole (DEXILANT) 60 MG capsule Take 1 capsule (60 mg total) by mouth daily. 30 capsule 1  . fluticasone (FLONASE) 50 MCG/ACT nasal spray Place 2 sprays into both nostrils daily. 16 g 6  . Fluticasone-Salmeterol (ADVAIR) 100-50  MCG/DOSE AEPB Inhale 1 puff into the lungs 2 (two) times daily. 1 each 2  . furosemide (LASIX) 40 MG tablet TAKE 1 BY MOUTH TWICE DAILY 180 tablet 0  . glucose blood test strip Test 1X per day and prn- Dx Code E11.9. One Touch Ultra Test Strips (Patient taking differently: 1 each by Other route daily. Test 1X per day and prn- Dx Code E11.9. One Touch Ultra Test Strips) 100 each 2  . ibuprofen (ADVIL,MOTRIN) 800 MG tablet TAKE 1 (800MG TOTAL) BY MOUTH EVERY 8 HOURS AS NEEDED 30 tablet 0  . metFORMIN (GLUCOPHAGE) 1000 MG tablet Take 1 tablet (1,000 mg total) by mouth 2 (two) times daily with a meal. 180 tablet 0  . metoprolol (LOPRESSOR) 50 MG tablet TAKE 2 BY MOUTH EVERY MORNING AND 1 BY MOUTH  AT BEDTIME 270 tablet 0  . omeprazole (PRILOSEC) 40 MG capsule Take 1 capsule (40 mg total) by mouth daily. 90 capsule 0  . ONETOUCH DELICA LANCETS 55H MISC 1 each by Does not apply route daily. Test 1X per day and prn - DX 250.02 (Patient taking differently: 1 each by Other route daily. Test 1X per day and prn - DX 250.02) 100 each 11  . oxyCODONE-acetaminophen (ROXICET) 5-325 MG tablet Take 1 tablet by mouth every 8 (eight) hours as needed for severe pain. 20 tablet 0  . potassium chloride (KLOR-CON M10) 10 MEQ tablet Take 1 tablet (10 mEq total) by mouth daily. 90 tablet 0   No current facility-administered medications for this visit.    Allergies as of 06/24/2015 - Review Complete 06/24/2015  Allergen Reaction Noted  . Amlodipine besy-benazepril hcl  06/24/2010  . Phenergan [promethazine hcl]  06/12/2012  . Hydrocodone Itching 02/13/2013    Family History  Problem Relation Age of Onset  . Colon cancer Mother   . Diabetes Father     siblings  . Heart disease Father     brother  . Kidney disease Sister     Social History   Social History  . Marital Status: Married    Spouse Name: N/A  . Number of Children: 3  . Years of Education: N/A   Occupational History  . retired    Social History Main Topics  . Smoking status: Former Smoker    Quit date: 02/08/1969  . Smokeless tobacco: Former Systems developer  . Alcohol Use: No  . Drug Use: No  . Sexual Activity: Not on file   Other Topics Concern  . Not on file   Social History Narrative     Physical Exam: BP 114/60 mmHg  Pulse 76  Ht _0  (1.676 m)  Wt 284 lb 4 oz (128.935 kg)  BMI 45.90 kg/m2 Constitutional: generally well-appearing Except for morbid obesity  Psychiatric: alert and oriented x3 Eyes: extraocular movements intact Mouth: oral pharynx moist, no lesions Neck: supple no lymphadenopathy Cardiovascular: heart regular rate and rhythm Lungs: clear to auscultation bilaterally Abdomen: soft, nontender,  nondistended, no obvious ascites, no peritoneal signs, normal bowel sounds Extremities: no lower extremity edema bilaterally Skin: no lesions on visible extremities Rectal exam deferred for upcoming colonoscopy  Assessment and plan: 70 y.o. male with  melena, bright red blood per rectum, nausea he has seen dark stools 6 weeks ago and chronic intermittent red blood per rectum. He has nausea. I recommended we proceed with EGD and colonoscopy at his soonest convenience.  He is going to get a basic set of labs including a CBC, complete metabolic profile as  well.  Owens Loffler, MD [facility] 06/24/2015, 2:17 PM  Cc: [name], [name], MD

## 2015-07-15 ENCOUNTER — Telehealth: Payer: Self-pay | Admitting: Cardiovascular Disease

## 2015-07-15 NOTE — Telephone Encounter (Signed)
New message   Pt is calling because he believes he is due for an appt   Per last ov Dr.Croitoru put in AVS: Your physician recommends that you schedule a follow-up appointment in: [place] CPAP DEVICE.  Dr. Sallyanne Kuster recommends that you schedule a follow-up appointment in: FOLLOWING ECHOCARDIOGRAM NEXT AVAILABLE.    I do not see referral for Dr.Kelly nor the Echo for pt  Last visit with Dr.Croitoru was 12-05-2014 please call pt

## 2015-07-15 NOTE — Telephone Encounter (Signed)
Will send to scheduling so appointments can be made Does look like follow up scheduled with Dr Sallyanne Kuster patient was a no show

## 2015-07-21 ENCOUNTER — Telehealth: Payer: Self-pay | Admitting: Gastroenterology

## 2015-07-21 ENCOUNTER — Encounter: Payer: Self-pay | Admitting: Gastroenterology

## 2015-07-21 NOTE — Telephone Encounter (Signed)
Pt aware that the rx is OTC, he will pick up today

## 2015-07-28 ENCOUNTER — Ambulatory Visit (AMBULATORY_SURGERY_CENTER): Payer: PPO | Admitting: Gastroenterology

## 2015-07-28 ENCOUNTER — Encounter: Payer: Self-pay | Admitting: Gastroenterology

## 2015-07-28 VITALS — BP 117/58 | HR 74 | Temp 98.0°F | Resp 17 | Ht 66.0 in | Wt 284.0 lb

## 2015-07-28 DIAGNOSIS — D125 Benign neoplasm of sigmoid colon: Secondary | ICD-10-CM

## 2015-07-28 DIAGNOSIS — K921 Melena: Secondary | ICD-10-CM

## 2015-07-28 DIAGNOSIS — D120 Benign neoplasm of cecum: Secondary | ICD-10-CM | POA: Diagnosis not present

## 2015-07-28 DIAGNOSIS — E119 Type 2 diabetes mellitus without complications: Secondary | ICD-10-CM | POA: Diagnosis not present

## 2015-07-28 DIAGNOSIS — D122 Benign neoplasm of ascending colon: Secondary | ICD-10-CM

## 2015-07-28 DIAGNOSIS — G4730 Sleep apnea, unspecified: Secondary | ICD-10-CM | POA: Diagnosis not present

## 2015-07-28 LAB — GLUCOSE, CAPILLARY
GLUCOSE-CAPILLARY: 133 mg/dL — AB (ref 65–99)
Glucose-Capillary: 111 mg/dL — ABNORMAL HIGH (ref 65–99)

## 2015-07-28 MED ORDER — SODIUM CHLORIDE 0.9 % IV SOLN: 500.0000 mL | INTRAVENOUS | Status: DC

## 2015-07-28 NOTE — Progress Notes (Signed)
A/ox3, pleased with MAC, report to RN

## 2015-07-28 NOTE — Patient Instructions (Signed)
Discharge instructions given. Handout on polyps. Resume previous medications. YOU HAD AN ENDOSCOPIC PROCEDURE TODAY AT [facility]:   Refer to the procedure report that was given to you for any specific questions about what was found during the examination.  If the procedure report does not answer your questions, please call your gastroenterologist to clarify.  If you requested that your care partner not be given the details of your procedure findings, then the procedure report has been included in a sealed envelope for you to review at your convenience later.  YOU SHOULD EXPECT: Some feelings of bloating in the abdomen. Passage of more gas than usual.  Walking can help get rid of the air that was put into your GI tract during the procedure and reduce the bloating. If you had a lower endoscopy (such as a colonoscopy or flexible sigmoidoscopy) you may notice spotting of blood in your stool or on the toilet paper. If you underwent a bowel prep for your procedure, you may not have a normal bowel movement for a few days.  Please Note:  You might notice some irritation and congestion in your nose or some drainage.  This is from the oxygen used during your procedure.  There is no need for concern and it should clear up in a day or so.  SYMPTOMS TO REPORT IMMEDIATELY:   Following lower endoscopy (colonoscopy or flexible sigmoidoscopy):  Excessive amounts of blood in the stool  Significant tenderness or worsening of abdominal pains  Swelling of the abdomen that is new, acute  Fever of 100F or higher   Following upper endoscopy (EGD)  Vomiting of blood or coffee ground material  New chest pain or pain under the shoulder blades  Painful or persistently difficult swallowing  New shortness of breath  Fever of 100F or higher  Black, tarry-looking stools  For urgent or emergent issues, a gastroenterologist can be reached at any hour by calling [phone].   DIET: Your first  meal following the procedure should be a small meal and then it is ok to progress to your normal diet. Heavy or fried foods are harder to digest and may make you feel nauseous or bloated.  Likewise, meals heavy in dairy and vegetables can increase bloating.  Drink plenty of fluids but you should avoid alcoholic beverages for 24 hours.  ACTIVITY:  You should plan to take it easy for the rest of today and you should NOT DRIVE or use heavy machinery until tomorrow (because of the sedation medicines used during the test).    FOLLOW UP: Our staff will call the number listed on your records the next business day following your procedure to check on you and address any questions or concerns that you may have regarding the information given to you following your procedure. If we do not reach you, we will leave a message.  However, if you are feeling well and you are not experiencing any problems, there is no need to return our call.  We will assume that you have returned to your regular daily activities without incident.  If any biopsies were taken you will be contacted by phone or by letter within the next 1-3 weeks.  Please call us at [phone] if you have not heard about the biopsies in 3 weeks.    SIGNATURES/CONFIDENTIALITY: You and/or your care partner have signed paperwork which will be entered into your electronic medical record.  These signatures attest to the fact that that the information  above on your After Visit Summary has been reviewed and is understood.  Full responsibility of the confidentiality of this discharge information lies with you and/or your care-partner.

## 2015-07-28 NOTE — Progress Notes (Signed)
Called to room to assist during endoscopic procedure.  Patient ID and intended procedure confirmed with present staff. Received instructions for my participation in the procedure from the performing physician.

## 2015-07-28 NOTE — Progress Notes (Addendum)
No egg or soy allergy known to patient  No issues with past sedation with any surgeries  or procedures, no intubation problems  No diet pills per patient No home 02 use per patient  No blood thinners per patient  Pt denies issues with constipation  Pt states he ate white fish yesterday  , informed Ardis Hughs by Red Christians RN

## 2015-07-29 ENCOUNTER — Telehealth: Payer: Self-pay

## 2015-07-29 NOTE — Op Note (Addendum)
[place] Patient Name: Kenneth Fuller Procedure Date: 07/28/2015 2:38 PM MRN: [id number] Endoscopist: Milus Banister , MD Age: 70 Referring MD:  Date of Birth: 1945-07-10 Gender: Male Account #: [id number] Procedure:                Colonoscopy Indications:              Melena Medicines:                Monitored Anesthesia Care Procedure:                Pre-Anesthesia Assessment:                           - Prior to the procedure, a History and Physical                            was performed, and patient medications and                            allergies were reviewed. The patient's tolerance of                            previous anesthesia was also reviewed. The risks                            and benefits of the procedure and the sedation                            options and risks were discussed with the patient.                            All questions were answered, and informed consent                            was obtained. Prior Anticoagulants: The patient has                            taken no previous anticoagulant or antiplatelet                            agents. ASA Grade Assessment: III - A patient with                            severe systemic disease. After reviewing the risks                            and benefits, the patient was deemed in                            satisfactory condition to undergo the procedure.                           After obtaining informed consent, the colonoscope  was passed under direct vision. Throughout the                            procedure, the patient's blood pressure, pulse, and                            oxygen saturations were monitored continuously. The                            Model CF-HQ190L [id number]) scope was introduced                            through the anus and advanced to the the cecum,                            identified by appendiceal orifice and ileocecal                            valve. The colonoscopy was performed without                            difficulty. The patient tolerated the procedure                            well. The quality of the bowel preparation was                            good. The ileocecal valve, appendiceal orifice, and                            rectum were photographed. Scope In: 3:20:10 PM Scope Out: 3:29:27 PM Scope Withdrawal Time: 0 hours 8 minutes 11 seconds  Total Procedure Duration: 0 hours 9 minutes 17 seconds  Findings:                 Three sessile polyps were found in the sigmoid                            colon, ascending colon and cecum. The polyps were 3                            to 5 mm in size. These polyps were removed with a                            cold snare. Resection and retrieval were complete.                           The exam was otherwise without abnormality on                            direct and retroflexion views. Complications:            No immediate complications. Estimated blood loss:  None. Estimated Blood Loss:     Estimated blood loss: none. Impression:               - Three 3 to 5 mm polyps in the sigmoid colon, in                            the ascending colon and in the cecum, removed with                            a cold snare. Resected and retrieved.                           - The examination was otherwise normal on direct                            and retroflexion views. Recommendation:           - Patient has a contact number available for                            emergencies. The signs and symptoms of potential                            delayed complications were discussed with the                            patient. Return to normal activities tomorrow.                            Written discharge instructions were provided to the                            patient.                           - Resume previous diet.                            - Continue present medications.                           You will receive a letter within 2-3 weeks with the                            pathology results and my final recommendations.                           If the polyp(s) is proven to be 'pre-cancerous' on                            pathology, you will need repeat colonoscopy in 3-5                            years. If the polyp(s) is NOT 'precancerous' on  pathology then you should repeat colon cancer                            screening in 10 years with colonoscopy without need                            for colon cancer screening by any method prior to                            then (including stool testing). Milus Banister, MD 07/28/2015 3:32:10 PM This report has been signed electronically.

## 2015-07-29 NOTE — Telephone Encounter (Signed)
  Follow up Call-  Call back number 07/28/2015  Post procedure Call Back phone  # [phone]  Permission to leave phone message Yes    Patient was called for follow up after his procedure on 07/28/2015. No answer at the number given for follow up phone call. A message was left on the answering machine.

## 2015-07-29 NOTE — Op Note (Addendum)
[place] Patient Name: Kenneth Fuller Procedure Date: 07/28/2015 2:38 PM MRN: [id number] Endoscopist: Milus Banister , MD Age: 70 Referring MD:  Date of Birth: 1945/10/17 Gender: Male Account #: [id number] Procedure:                Upper GI endoscopy Indications:              Melena Medicines:                Monitored Anesthesia Care Procedure:                Pre-Anesthesia Assessment:                           - Prior to the procedure, a History and Physical                            was performed, and patient medications and                            allergies were reviewed. The patient's tolerance of                            previous anesthesia was also reviewed. The risks                            and benefits of the procedure and the sedation                            options and risks were discussed with the patient.                            All questions were answered, and informed consent                            was obtained. Prior Anticoagulants: The patient has                            taken no previous anticoagulant or antiplatelet                            agents. ASA Grade Assessment: III - A patient with                            severe systemic disease. After reviewing the risks                            and benefits, the patient was deemed in                            satisfactory condition to undergo the procedure.                           After obtaining informed consent, the endoscope was  passed under direct vision. Throughout the                            procedure, the patient's blood pressure, pulse, and                            oxygen saturations were monitored continuously. The                            Model GIF-[id number]) scope was introduced                            through the mouth, and advanced to the second part                            of duodenum. The upper GI endoscopy was                            accomplished without difficulty. The patient                            tolerated the procedure well. Scope In: Scope Out: Findings:                 The esophagus was normal.                           The stomach was normal.                           The examined duodenum was normal. Complications:            No immediate complications. Estimated blood loss:                            None. Estimated Blood Loss:     Estimated blood loss: none. Impression:               - Normal esophagus.                           - Normal stomach.                           - Normal examined duodenum.                           - No specimens collected. Recommendation:           - Patient has a contact number available for                            emergencies. The signs and symptoms of potential                            delayed complications were discussed with the  patient. Return to normal activities tomorrow.                            Written discharge instructions were provided to the                            patient.                           - Resume previous diet.                           - Continue present medications.                           - No repeat upper endoscopy. Milus Banister, MD 07/28/2015 3:36:38 PM This report has been signed electronically.

## 2015-08-10 ENCOUNTER — Encounter: Payer: Self-pay | Admitting: Gastroenterology

## 2015-08-19 ENCOUNTER — Other Ambulatory Visit: Payer: Self-pay | Admitting: Nurse Practitioner

## 2015-08-21 ENCOUNTER — Encounter: Payer: Self-pay | Admitting: Family Medicine

## 2015-08-21 ENCOUNTER — Ambulatory Visit (INDEPENDENT_AMBULATORY_CARE_PROVIDER_SITE_OTHER): Payer: PPO | Admitting: Family Medicine

## 2015-08-21 VITALS — BP 130/73 | HR 72 | Temp 97.3°F | Ht 66.0 in | Wt 282.0 lb

## 2015-08-21 DIAGNOSIS — E119 Type 2 diabetes mellitus without complications: Secondary | ICD-10-CM

## 2015-08-21 DIAGNOSIS — I10 Essential (primary) hypertension: Secondary | ICD-10-CM

## 2015-08-21 DIAGNOSIS — G4733 Obstructive sleep apnea (adult) (pediatric): Secondary | ICD-10-CM

## 2015-08-21 DIAGNOSIS — R0602 Shortness of breath: Secondary | ICD-10-CM | POA: Diagnosis not present

## 2015-08-21 DIAGNOSIS — Z1159 Encounter for screening for other viral diseases: Secondary | ICD-10-CM | POA: Diagnosis not present

## 2015-08-21 LAB — BAYER DCA HB A1C WAIVED: HB A1C (BAYER DCA - WAIVED): 7.9 % — ABNORMAL HIGH (ref <7.0)

## 2015-08-21 NOTE — Progress Notes (Signed)
BP 130/73 mmHg  Pulse 72  Temp(Src) 97.3 F (36.3 C) (Oral)  Ht 5' 6" (1.676 m)  Wt 282 lb (127.914 kg)  BMI 45.54 kg/m2  SpO2 96%   Subjective:    Patient ID: Kenneth Fuller, male    DOB: [birth year], 70 y.o.   MRN: [id number]  HPI: Kenneth Fuller is a 70 y.o. male presenting on 08/21/2015 for Diabetes; Back Pain; Shortness of Breath; and Insomnia   HPI Diabetes recheck Patient comes in for recheck of his type 2 diabetes. Patient is currently taking metformin 1000 twice a day. He denies any issues with the medication. He says he had an ophthalmology visit just 1 month ago but we did not get the report from it so he is going to request that for [place]. Patient is not currently on ACE inhibitor because he has been as a pro-listed on his allergy list. He denies any issues with his feet. He denies any chest pain. He does complain of some shortness of breath that is more prominent with laying flat. He does have known sleep apnea and is morbidly obese and is not currently using his CPAP like he should. Patient denies shortness of breath at rest but mainly gets it when he is lying flat or does prolonged exertion such as walking more than 3 or 4 blocks.  Morbid obesity Patient wants to discuss going to see a bariatric facility to see if it is possibility for bariatric surgery or weight loss surgery in the future.  Hypertension recheck Patient is coming in for hypertension recheck. His blood pressure is 130/73. He is currently on Norvasc and metoprolol. Patient denies headaches, blurred vision, chest pains or weakness. Denies any side effects from medication and is content with current medication.   Sleep apnea Patient has trouble with his sleep machine and has not gone back to see somebody for sleep apnea in quite some time and would like to be referred back to somebody for that.  Relevant past medical, surgical, family and social history reviewed and updated as indicated. Interim medical  history since our last visit reviewed. Allergies and medications reviewed and updated.  Review of Systems  Constitutional: Negative for fever.  HENT: Negative for congestion, ear discharge, ear pain and sore throat.   Eyes: Negative for discharge and visual disturbance.  Respiratory: Positive for shortness of breath. Negative for cough, chest tightness and wheezing.   Cardiovascular: Negative for chest pain, palpitations and leg swelling.  Gastrointestinal: Negative for abdominal pain, diarrhea and constipation.  Genitourinary: Negative for difficulty urinating.  Musculoskeletal: Negative for back pain and gait problem.  Skin: Negative for rash.  Neurological: Negative for syncope, light-headedness and headaches.  All other systems reviewed and are negative.   Per HPI unless specifically indicated above     Medication List       This list is accurate as of: 08/21/15  1:46 PM.  Always use your most recent med list.               albuterol 108 (90 Base) MCG/ACT inhaler  Commonly known as:  PROVENTIL HFA;VENTOLIN HFA  Inhale 2 puffs into the lungs every 6 (six) hours as needed for wheezing or shortness of breath.     allopurinol 100 MG tablet  Commonly known as:  ZYLOPRIM  Take 1 tablet (100 mg total) by mouth daily.     amLODipine 10 MG tablet  Commonly known as:  NORVASC  TAKE 1/2 (5MG) BY MOUTH DAILY  atorvastatin 40 MG tablet  Commonly known as:  LIPITOR  Take 1 tablet (40 mg total) by mouth daily.     dexlansoprazole 60 MG capsule  Commonly known as:  DEXILANT  Take 1 capsule (60 mg total) by mouth daily.     fluticasone 50 MCG/ACT nasal spray  Commonly known as:  FLONASE  Place 2 sprays into both nostrils daily.     Fluticasone-Salmeterol 100-50 MCG/DOSE Aepb  Commonly known as:  ADVAIR  Inhale 1 puff into the lungs 2 (two) times daily.     furosemide 40 MG tablet  Commonly known as:  LASIX  TAKE 1 BY MOUTH TWICE DAILY     glucose blood test strip    Test 1X per day and prn- Dx Code E11.9. One Touch Ultra Test Strips     ibuprofen 800 MG tablet  Commonly known as:  ADVIL,MOTRIN  TAKE 1 (800MG TOTAL) BY MOUTH EVERY 8 HOURS AS NEEDED     metFORMIN 1000 MG tablet  Commonly known as:  GLUCOPHAGE  Take 1 tablet (1,000 mg total) by mouth 2 (two) times daily with a meal.     metoprolol 50 MG tablet  Commonly known as:  LOPRESSOR  TAKE 2 BY MOUTH EVERY MORNING AND 1 BY MOUTH AT BEDTIME     Na Sulfate-K Sulfate-Mg Sulf 17.5-3.13-1.6 GM/180ML Soln  Take 1 kit by mouth once.     omeprazole 40 MG capsule  Commonly known as:  PRILOSEC  TAKE ONE CAPSULE BY MOUTH ONCE DAILY     ONETOUCH DELICA LANCETS 33G Misc  1 each by Does not apply route daily. Test 1X per day and prn - DX 250.02     potassium chloride 10 MEQ tablet  Commonly known as:  KLOR-CON M10  Take 1 tablet (10 mEq total) by mouth daily.     PRODIGY BLOOD GLUCOSE MONITOR w/Device Kit  1 Units by Does not apply route daily. Check blood sugars 1X per day-- dx 250.02     VITAMIN D PO  Take 1 tablet by mouth every morning.           Objective:    BP 130/73 mmHg  Pulse 72  Temp(Src) 97.3 F (36.3 C) (Oral)  Ht 5' 6" (1.676 m)  Wt 282 lb (127.914 kg)  BMI 45.54 kg/m2  SpO2 96%  Wt Readings from Last 3 Encounters:  08/21/15 282 lb (127.914 kg)  07/28/15 284 lb (128.822 kg)  06/24/15 284 lb 4 oz (128.935 kg)    Physical Exam  Constitutional: He is oriented to person, place, and time. He appears well-developed and well-nourished. No distress.  Obese  HENT:  Right Ear: External ear normal.  Left Ear: External ear normal.  Nose: Nose normal.  Mouth/Throat: Oropharynx is clear and moist. No oropharyngeal exudate.  Eyes: Conjunctivae and EOM are normal. Pupils are equal, round, and reactive to light. Right eye exhibits no discharge. No scleral icterus.  Neck: Neck supple. No thyromegaly present.  Cardiovascular: Normal rate, regular rhythm, normal heart sounds and  intact distal pulses.   No murmur heard. Pulmonary/Chest: Effort normal and breath sounds normal. No respiratory distress. He has no wheezes. He has no rales. He exhibits no tenderness.  Musculoskeletal: Normal range of motion. He exhibits no edema or tenderness.  Lymphadenopathy:    He has no cervical adenopathy.  Neurological: He is alert and oriented to person, place, and time. Coordination normal.  Skin: Skin is warm and dry. No rash noted. He is   not diaphoretic.  Psychiatric: He has a normal mood and affect. His behavior is normal.  Nursing note and vitals reviewed.     Assessment & Plan:       Problem List Items Addressed This Visit      Cardiovascular and Mediastinum   Essential hypertension - Primary   Relevant Orders   CMP14+EGFR     Endocrine   Diabetes ([place])   Relevant Orders   Bayer DCA Hb A1c Waived   CMP14+EGFR   Microalbumin / creatinine urine ratio   Lipid panel     Other   Morbid obesity ([place])   Relevant Orders   TSH   Amb ref to Medical Nutrition Therapy-MNT    Other Visit Diagnoses    Need for hepatitis C screening test        Relevant Orders    Hepatitis C antibody    SOB (shortness of breath)        Increased shortness of breath/persistent, inhalers not seeming to help, run echocardiogram, if normal then sent to pulmonology    Relevant Orders    ECHOCARDIOGRAM COMPLETE    Brain natriuretic peptide    Obstructive sleep apnea        Relevant Orders    Ambulatory referral to Sleep Studies        Follow up plan: Return in about 4 weeks (around 09/18/2015), or if symptoms worsen or fail to improve, for Recheck breathing.  Counseling provided for all of the vaccine components Orders Placed This Encounter  Procedures  . Hepatitis C antibody  . Bayer DCA Hb A1c Waived  . CMP14+EGFR  . Microalbumin / creatinine urine ratio  . TSH  . Lipid panel  . Brain natriuretic peptide  . Ambulatory referral to Sleep Studies  . Amb ref to Medical  Nutrition Therapy-MNT  . ECHOCARDIOGRAM COMPLETE    Caryl Pina, MD [facility] 08/21/2015, 1:46 PM

## 2015-08-22 LAB — CMP14+EGFR
ALT: 40 IU/L (ref 0–44)
AST: 34 IU/L (ref 0–40)
Albumin/Globulin Ratio: 1.7 (ref 1.2–2.2)
Albumin: 4.3 g/dL (ref 3.6–4.8)
Alkaline Phosphatase: 60 IU/L (ref 39–117)
BUN/Creatinine Ratio: 13 (ref 10–24)
BUN: 10 mg/dL (ref 8–27)
Bilirubin Total: 0.4 mg/dL (ref 0.0–1.2)
CO2: 27 mmol/L (ref 18–29)
Calcium: 9 mg/dL (ref 8.6–10.2)
Chloride: 96 mmol/L (ref 96–106)
Creatinine, Ser: 0.8 mg/dL (ref 0.76–1.27)
GFR calc Af Amer: 105 mL/min/{1.73_m2} (ref >59)
GFR calc non Af Amer: 91 mL/min/{1.73_m2} (ref >59)
Globulin, Total: 2.5 g/dL (ref 1.5–4.5)
Glucose: 154 mg/dL — ABNORMAL HIGH (ref 65–99)
Potassium: 4.4 mmol/L (ref 3.5–5.2)
Sodium: 141 mmol/L (ref 134–144)
Total Protein: 6.8 g/dL (ref 6.0–8.5)

## 2015-08-22 LAB — LIPID PANEL
CHOL/HDL RATIO: 2.4 ratio (ref 0.0–5.0)
Cholesterol, Total: 96 mg/dL — ABNORMAL LOW (ref 100–199)
HDL: 40 mg/dL (ref >39)
LDL Calculated: 27 mg/dL (ref 0–99)
Triglycerides: 145 mg/dL (ref 0–149)
VLDL Cholesterol Cal: 29 mg/dL (ref 5–40)

## 2015-08-22 LAB — BRAIN NATRIURETIC PEPTIDE: BNP: 72.6 pg/mL (ref 0.0–100.0)

## 2015-08-22 LAB — TSH: TSH: 2.3 u[IU]/mL (ref 0.450–4.500)

## 2015-08-22 LAB — HEPATITIS C ANTIBODY: Hep C Virus Ab: 0.3 s/co ratio (ref 0.0–0.9)

## 2015-08-23 MED ORDER — CANAGLIFLOZIN 100 MG PO TABS: 100.0000 mg | ORAL_TABLET | Freq: Every day | ORAL | Status: DC

## 2015-08-23 NOTE — Addendum Note (Signed)
Addended by: Shelbie Ammons on: 08/23/2015 10:34 AM   Modules accepted: Orders

## 2015-08-25 ENCOUNTER — Telehealth: Payer: Self-pay | Admitting: Family Medicine

## 2015-08-25 MED ORDER — EMPAGLIFLOZIN 10 MG PO TABS: 10.0000 mg | ORAL_TABLET | Freq: Every day | ORAL | Status: DC

## 2015-08-25 NOTE — Telephone Encounter (Signed)
Please tell patient I sent jardiance for him and have him come by and pick up a coupon for it before getting the medication to see how much this will cost

## 2015-08-25 NOTE — Telephone Encounter (Signed)
Patient aware. We have no coupons and patient is aware.

## 2015-09-05 ENCOUNTER — Other Ambulatory Visit: Payer: Self-pay | Admitting: Family Medicine

## 2015-09-17 ENCOUNTER — Telehealth: Payer: Self-pay | Admitting: Cardiovascular Disease

## 2015-09-17 ENCOUNTER — Ambulatory Visit (INDEPENDENT_AMBULATORY_CARE_PROVIDER_SITE_OTHER): Payer: PPO | Admitting: Cardiovascular Disease

## 2015-09-17 ENCOUNTER — Encounter: Payer: Self-pay | Admitting: Cardiovascular Disease

## 2015-09-17 ENCOUNTER — Telehealth: Payer: Self-pay

## 2015-09-17 VITALS — BP 130/82 | HR 64 | Ht 67.0 in | Wt 274.0 lb

## 2015-09-17 DIAGNOSIS — E785 Hyperlipidemia, unspecified: Secondary | ICD-10-CM

## 2015-09-17 DIAGNOSIS — G4730 Sleep apnea, unspecified: Secondary | ICD-10-CM

## 2015-09-17 DIAGNOSIS — E119 Type 2 diabetes mellitus without complications: Secondary | ICD-10-CM

## 2015-09-17 DIAGNOSIS — I10 Essential (primary) hypertension: Secondary | ICD-10-CM | POA: Diagnosis not present

## 2015-09-17 DIAGNOSIS — R0609 Other forms of dyspnea: Secondary | ICD-10-CM

## 2015-09-17 DIAGNOSIS — I2510 Atherosclerotic heart disease of native coronary artery without angina pectoris: Secondary | ICD-10-CM | POA: Diagnosis not present

## 2015-09-17 NOTE — Telephone Encounter (Signed)
New message   Pt verbalized that he is calling to return rn call

## 2015-09-17 NOTE — Patient Instructions (Signed)
Dr Sallyanne Kuster recommends that you schedule a follow-up appointment in 6 months. You will receive a reminder letter in the mail two months in advance. If you don't receive a letter, please call our office to schedule the follow-up appointment.  If you need a refill on your cardiac medications before your next appointment, please call your pharmacy.

## 2015-09-17 NOTE — Telephone Encounter (Signed)
Patient seen today, by Dr C, complained of issues with CPAP machine.   Returned call to patient.   Gave instructions to increase RAMP time and increase humidity level. Patient unsure how to do this. Recommendation made to contact DME co. I requested the name of DME co, patient did not know.   Patient has an appointment in Sept with Dr Claiborne Billings in sleep clinic. Recommended that he bring his machine to this office visit. Also notified patient that he may have to start the process all over again due to lack of compliance. Patient verbalized understanding and agreed with plan.

## 2015-09-17 NOTE — Telephone Encounter (Signed)
lmtcb

## 2015-09-17 NOTE — Progress Notes (Signed)
Cardiology Office Note    Date:  09/17/2015   ID:  Kenneth Fuller, DOB Sep 28, 1945, MRN [id number]  PCP:  Worthy Rancher, MD  Cardiologist:   Sanda Klein, MD   No chief complaint on file.   History of Present Illness:  Kenneth Fuller is a 70 y.o. male with morbid obesity, obstructive sleep apnea, hyperlipidemia, diabetes mellitus complicated by neuropathy, minor nonobstructive coronary atherosclerosis.  He continues to have complaints of NYHA functional class II-III dyspnea. Taking a shower makes him short of breath. He does not have angina either at rest or with exertion. He does describe occasional chest discomfort associated with meals, described as "heartburn" and resolving with antacids. He does not have leg edema.  The echocardiogram performed in November 2016 showed normal left ventricular systolic function and regional wall motion. There were some findings suggestive of diastolic dysfunction and there was mild left ventricular hypertrophy, but the Doppler study did not clearly demonstrate elevated filling pressure.  He has lost 10 pounds in weight, but this has had little positive impact on his breathing  He had coronary angiography in 2011 for what ended up being a "false positive" nuclear stress test related to diaphragmatic attenuation of the inferior wall. There was 20-40% scattered atherosclerosis in the LAD artery, without any lesions in the vessel supplying the inferior wall.  He is currently not using CPAP to the same complaints of severe dry mouth. He has a fairly old piece of equipment. He does have an in-line humidifier. He complains about the relatively short ramp time of his device that does not allow him to fall asleep before reaching maximum pressure. He has not yet had an appointment in the sleep clinic but is due to see Dr. Claiborne Billings on September 7.  Past Medical History:  Diagnosis Date  . Allergy   . CAD (coronary artery disease)   . Diabetes  mellitus   . Fibromyalgia   . GERD (gastroesophageal reflux disease)   . GI bleeding   . Gout   . Hyperlipidemia   . Hypertension   . Hypogonadism male   . MRSA cellulitis   . Neuropathy ([place])   . Obesity   . Shortness of breath dyspnea    with exertion   . Sleep apnea    cpap- 14   . Wheezing    no asthma diagnosis    Past Surgical History:  Procedure Laterality Date  . BACK SURGERY    . CARDIAC CATHETERIZATION    . COLONOSCOPY    . LUMBAR LAMINECTOMY/DECOMPRESSION MICRODISCECTOMY N/A 01/02/2014   Procedure: CENTRAL DECOMPRESSION LUMBAR LAMINECTOMY L3-L4, L4-L5;  Surgeon: Tobi Bastos, MD;  Location: [facility];  Service: Orthopedics;  Laterality: N/A;  . neck fusion      Current Medications: Outpatient Medications Prior to Visit  Medication Sig Dispense Refill  . albuterol (PROVENTIL HFA;VENTOLIN HFA) 108 (90 Base) MCG/ACT inhaler Inhale 2 puffs into the lungs every 6 (six) hours as needed for wheezing or shortness of breath. 1 Inhaler 2  . allopurinol (ZYLOPRIM) 100 MG tablet Take 1 tablet (100 mg total) by mouth daily. 30 tablet 2  . amLODipine (NORVASC) 10 MG tablet TAKE 1/2 (5MG) BY MOUTH DAILY 45 tablet 0  . atorvastatin (LIPITOR) 40 MG tablet Take 1 tablet (40 mg total) by mouth daily. 90 tablet 0  . Blood Glucose Monitoring Suppl (PRODIGY BLOOD GLUCOSE MONITOR) W/DEVICE KIT 1 Units by Does not apply route daily. Check blood sugars 1X per day-- dx  250.02 1 each 0  . canagliflozin (INVOKANA) 100 MG TABS tablet Take 1 tablet (100 mg total) by mouth daily before breakfast. 30 tablet 2  . Cholecalciferol (VITAMIN D PO) Take 1 tablet by mouth every morning.    . empagliflozin (JARDIANCE) 10 MG TABS tablet Take 10 mg by mouth daily. 30 tablet 3  . fluticasone (FLONASE) 50 MCG/ACT nasal spray Place 2 sprays into both nostrils daily. 16 g 6  . Fluticasone-Salmeterol (ADVAIR) 100-50 MCG/DOSE AEPB Inhale 1 puff into the lungs 2 (two) times daily. 1 each 2  . furosemide (LASIX)  40 MG tablet TAKE 1 BY MOUTH TWICE DAILY 180 tablet 0  . glucose blood test strip Test 1X per day and prn- Dx Code E11.9. One Touch Ultra Test Strips 100 each 2  . ibuprofen (ADVIL,MOTRIN) 800 MG tablet TAKE 1 (800MG TOTAL) BY MOUTH EVERY 8 HOURS AS NEEDED 30 tablet 0  . KLOR-CON M10 10 MEQ tablet TAKE ONE TABLET BY MOUTH ONCE DAILY 90 tablet 1  . metFORMIN (GLUCOPHAGE) 1000 MG tablet Take 1 tablet (1,000 mg total) by mouth 2 (two) times daily with a meal. 180 tablet 0  . omeprazole (PRILOSEC) 40 MG capsule TAKE ONE CAPSULE BY MOUTH ONCE DAILY 90 capsule 0  . ONETOUCH DELICA LANCETS 80D MISC 1 each by Does not apply route daily. Test 1X per day and prn - DX 250.02 100 each 11  . dexlansoprazole (DEXILANT) 60 MG capsule Take 1 capsule (60 mg total) by mouth daily. (Patient not taking: Reported on 09/17/2015) 30 capsule 1  . metoprolol (LOPRESSOR) 50 MG tablet TAKE 2 BY MOUTH EVERY MORNING AND 1 BY MOUTH AT BEDTIME (Patient not taking: Reported on 09/17/2015) 270 tablet 0  . Na Sulfate-K Sulfate-Mg Sulf SOLN Take 1 kit by mouth once. (Patient not taking: Reported on 09/17/2015) 354 mL 0   No facility-administered medications prior to visit.      Allergies:   Amlodipine besy-benazepril hcl; Phenergan [promethazine hcl]; and Hydrocodone   Social History   Social History  . Marital status: Married    Spouse name: N/A  . Number of children: 3  . Years of education: N/A   Occupational History  . retired Disabled   Social History Main Topics  . Smoking status: Former Smoker    Quit date: 02/08/1969  . Smokeless tobacco: Former Systems developer  . Alcohol use No  . Drug use: No  . Sexual activity: Not Asked   Other Topics Concern  . None   Social History Narrative  . None     Family History:  The patient's family history includes Colon cancer in his mother; Diabetes in his father; Heart disease in his father; Kidney disease in his sister.   ROS:   Please see the history of present illness.    ROS  All other systems reviewed and are negative.   PHYSICAL EXAM:   VS:  BP 130/82   Pulse 64   Ht _0  (1.702 m)   Wt 274 lb (124.3 kg)   BMI 42.91 kg/m    GEN: Morbid obesity, well developed, in no acute distress  HEENT: normal except crowded oropharynx Neck: no JVD, carotid bruits, or masses Cardiac: RRR; no murmurs, rubs, or gallops,no edema  Respiratory:  clear to auscultation bilaterally, normal work of breathing GI: soft, nontender, nondistended, + BS MS: no deformity or atrophy  Skin: warm and dry, no rash Neuro:  Alert and Oriented x 3, Strength and sensation are intact Psych:  euthymic mood, full affect  Wt Readings from Last 3 Encounters:  09/17/15 274 lb (124.3 kg)  08/21/15 282 lb (127.9 kg)  07/28/15 284 lb (128.8 kg)      Studies/Labs Reviewed:   EKG:  EKG is ordered today.  The ekg ordered today demonstrates Normal sinus rhythm with a relatively low voltage throughout, otherwise normal  Recent Labs: 06/24/2015: Hemoglobin 13.8; Platelets 265.0 08/21/2015: ALT 40; BNP 72.6; BUN 10; Creatinine, Ser 0.80; Potassium 4.4; Sodium 141; TSH 2.300   Lipid Panel    Component Value Date/Time   CHOL 96 (L) 08/21/2015 1352   CHOL 111 06/12/2012 1232   TRIG 145 08/21/2015 1352   TRIG 141 01/24/2014 1143   TRIG 192 (H) 06/12/2012 1232   HDL 40 08/21/2015 1352   HDL 38 (L) 01/24/2014 1143   HDL 33 (L) 06/12/2012 1232   CHOLHDL 2.4 08/21/2015 1352   CHOLHDL 4.8 11/22/2006 1712   VLDL 39 11/22/2006 1712   LDLCALC 27 08/21/2015 1352   LDLCALC 27 10/25/2013 1224   LDLCALC 40 06/12/2012 1232    Labs from 08/21/2015 BNP 72.6, hemoglobin A1c increased from 7.1% to 7.9%, creatinine 0.8, potassium 4.4, normal liver function tests, normal TSH Total cholesterol 96, triglycerides 145, HDL 40, LDL 27   ASSESSMENT:    1. Exertional dyspnea   2. Sleep apnea   3. Essential hypertension   4. Coronary artery disease involving native coronary artery of native heart without  angina pectoris   5. Hyperlipidemia with target LDL less than 100   6. Type 2 diabetes mellitus without complication, without long-term current use of insulin (HCC)   7. Morbid obesity, unspecified obesity type ([place])      PLAN:  In order of problems listed above:  1. Dyspnea: I think the most likely cause for his dyspnea remains morbid obesity and untreated obstructive sleep apnea. We might have to repeat a right left heart catheterization if he does not respond to treatment for sleep apnea 2. OSA: He has an appointment with Dr. Claiborne Billings scheduled for next month. In the meantime we have tried to put him in contact with the equipment provider to see if there is anything we can change about a solidified that might improve compliance. I think he will benefit from a more modern device 3. HTN: Blood pressure is well controlled 4. CAD: Previous coronary angiography showed only minor nonobstructive atherosclerosis 5. HLP: Lipid profile parameters are at target, even his HDL has improved 6. DM: Asked him to contact his diabetes specialist to make sure he is not simultaneously taking Invokana and Jardiance, as his medication list suggests. The patient believes he is indeed taking both 7. Ultimately his morbid obesity is his biggest liability and likely responsible for many of his symptoms    Medication Adjustments/Labs and Tests Ordered: Current medicines are reviewed at length with the patient today.  Concerns regarding medicines are outlined above.  Medication changes, Labs and Tests ordered today are listed in the Patient Instructions below. Patient Instructions  Dr Sallyanne Kuster recommends that you schedule a follow-up appointment in 6 months. You will receive a reminder letter in the mail two months in advance. If you don't receive a letter, please call our office to schedule the follow-up appointment.  If you need a refill on your cardiac medications before your next appointment, please call your  pharmacy.    Signed, Sanda Klein, MD  09/17/2015 8:07 PM    [place],  [place], [place]  [address] Phone: [phone]; Fax: [phone]

## 2015-09-22 NOTE — Telephone Encounter (Signed)
Patient returned call. Patient had been complaining of increased shortness of breath and that his CPAP machine drys his mouth out. Anderson Malta, a rep from [employer] - Recommended to increase RAMP time and increase humidity level. Patient didn't know how to do this. "Hasn't used CPAP in a while." Recommended that he contact his DME company with this and any questions he has about the machine. I inquired as to who the DME company is, patient didn't know at the time of call. Patient has an upcoming appointment with Dr Claiborne Billings in the sleep clinic. Patient verbalized understanding and agreed with plan.

## 2015-10-10 ENCOUNTER — Telehealth: Payer: Self-pay | Admitting: Family Medicine

## 2015-10-16 ENCOUNTER — Encounter: Payer: Self-pay | Admitting: Cardiovascular Disease

## 2015-10-16 ENCOUNTER — Ambulatory Visit (INDEPENDENT_AMBULATORY_CARE_PROVIDER_SITE_OTHER): Payer: PPO | Admitting: Cardiovascular Disease

## 2015-10-16 VITALS — BP 142/78 | HR 70 | Ht 67.0 in | Wt 270.8 lb

## 2015-10-16 DIAGNOSIS — I10 Essential (primary) hypertension: Secondary | ICD-10-CM | POA: Diagnosis not present

## 2015-10-16 DIAGNOSIS — G4730 Sleep apnea, unspecified: Secondary | ICD-10-CM

## 2015-10-16 DIAGNOSIS — G4733 Obstructive sleep apnea (adult) (pediatric): Secondary | ICD-10-CM | POA: Diagnosis not present

## 2015-10-16 DIAGNOSIS — E785 Hyperlipidemia, unspecified: Secondary | ICD-10-CM

## 2015-10-16 DIAGNOSIS — E662 Morbid (severe) obesity with alveolar hypoventilation: Secondary | ICD-10-CM

## 2015-10-16 DIAGNOSIS — E119 Type 2 diabetes mellitus without complications: Secondary | ICD-10-CM

## 2015-10-16 NOTE — Patient Instructions (Signed)
Your physician has recommended that you have a sleep study. This test records several body functions during sleep, including: brain activity, eye movement, oxygen and carbon dioxide blood levels, heart rate and rhythm, breathing rate and rhythm, the flow of air through your mouth and nose, snoring, body muscle movements, and chest and belly movement. This will be done at [facility].  Your physician recommends that you schedule a follow-up appointment in: 2-3 months in sleep clinic.

## 2015-10-18 NOTE — Progress Notes (Signed)
ID:  PRAVIN PEREZPEREZ, DOB [birth year], MRN [id number]  PCP:  Worthy Rancher, MD                      Cardiologist:   Sanda Klein, MD   HPI: LINZY DARLING is a 70 y.o. male who is referred for sleep clinic to the courtesy of Dr. Sallyanne Kuster for reassessment of obstructive sleep apnea.  Mr. Fitzmaurice states that he had undergone a sleep study approximately 4 years ago in [place], [place].  At that time, he never is evaluated by a sleep physician following his sleep study but CPAP therapy was recommended and he initially used a fullface mask.  He had significant difficulty with a dry mouth.  He ultimately stop using CPAP well over a year ago.  He has a history of morbid obesity, hypertension, and diabetes mellitus.  He previously had undergone coronary angiography which showed mild nonobstructive atherosclerosis.  Presently, he goes to bed between 10 PM and midnight and often times now that he is retired wakes up around 9:51 AM.  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  Presently, he has significant difficulty with nonrestorative sleep, and has frequent awakenings.  He does snore.  His weight has doubled since he initially married and increased from 135 pounds to 270 pounds.  He recently seen Dr. Sallyanne Kuster is referred for sleep evaluation.  Epworth Sleepiness Scale score was calculated today and this endorsed at 9 shown below.   Epworth Sleepiness Scale: Situation   Chance of Dozing/Sleeping (0 = never , 1 = slight chance , 2 = moderate chance , 3 = high chance )   sitting and reading 1   watching TV 2   sitting inactive in a public place 2   being a passenger in a motor vehicle for an hour or more 2   lying down in the afternoon 1   sitting and talking to someone 0   sitting quietly after lunch (no alcohol) 1   while stopped for a few minutes in traffic as the driver 0   Total Score  9    Past Medical History:  Diagnosis Date  .  Allergy   . CAD (coronary artery disease)   . Diabetes mellitus   . Fibromyalgia   . GERD (gastroesophageal reflux disease)   . GI bleeding   . Gout   . Hyperlipidemia   . Hypertension   . Hypogonadism male   . MRSA cellulitis   . Neuropathy ([place])   . Obesity   . Shortness of breath dyspnea    with exertion   . Sleep apnea    cpap- 14   . Wheezing    no asthma diagnosis    Past Surgical History:  Procedure Laterality Date  . BACK SURGERY    . CARDIAC CATHETERIZATION    . COLONOSCOPY    . LUMBAR LAMINECTOMY/DECOMPRESSION MICRODISCECTOMY N/A 01/02/2014   Procedure: CENTRAL DECOMPRESSION LUMBAR LAMINECTOMY L3-L4, L4-L5;  Surgeon: Tobi Bastos, MD;  Location: [facility];  Service: Orthopedics;  Laterality: N/A;  . neck fusion      Allergies  Allergen Reactions  . Amlodipine Besy-Benazepril Hcl     Makes tongue swell  . Phenergan [Promethazine Hcl]     "I can't remember."  . Hydrocodone Itching    Can tolerate in low doses    Current Outpatient Prescriptions  Medication Sig Dispense Refill  . albuterol (PROVENTIL  HFA;VENTOLIN HFA) 108 (90 Base) MCG/ACT inhaler Inhale 2 puffs into the lungs every 6 (six) hours as needed for wheezing or shortness of breath. 1 Inhaler 2  . allopurinol (ZYLOPRIM) 100 MG tablet Take 1 tablet (100 mg total) by mouth daily. 30 tablet 2  . amLODipine (NORVASC) 10 MG tablet TAKE 1/2 (5MG) BY MOUTH DAILY 45 tablet 0  . atorvastatin (LIPITOR) 40 MG tablet Take 1 tablet (40 mg total) by mouth daily. 90 tablet 0  . Blood Glucose Monitoring Suppl (PRODIGY BLOOD GLUCOSE MONITOR) W/DEVICE KIT 1 Units by Does not apply route daily. Check blood sugars 1X per day-- dx 250.02 1 each 0  . canagliflozin (INVOKANA) 100 MG TABS tablet Take 1 tablet (100 mg total) by mouth daily before breakfast. 30 tablet 2  . Cholecalciferol (VITAMIN D PO) Take 1 tablet by mouth every morning.    . empagliflozin (JARDIANCE) 10 MG TABS tablet Take 10 mg by mouth daily. 30 tablet  3  . fluticasone (FLONASE) 50 MCG/ACT nasal spray Place 2 sprays into both nostrils daily. 16 g 6  . Fluticasone-Salmeterol (ADVAIR) 100-50 MCG/DOSE AEPB Inhale 1 puff into the lungs 2 (two) times daily. 1 each 2  . furosemide (LASIX) 40 MG tablet TAKE 1 BY MOUTH TWICE DAILY 180 tablet 0  . glucose blood test strip Test 1X per day and prn- Dx Code E11.9. One Touch Ultra Test Strips 100 each 2  . ibuprofen (ADVIL,MOTRIN) 800 MG tablet TAKE 1 (800MG TOTAL) BY MOUTH EVERY 8 HOURS AS NEEDED 30 tablet 0  . KLOR-CON M10 10 MEQ tablet TAKE ONE TABLET BY MOUTH ONCE DAILY 90 tablet 1  . metFORMIN (GLUCOPHAGE) 1000 MG tablet Take 1 tablet (1,000 mg total) by mouth 2 (two) times daily with a meal. 180 tablet 0  . metoprolol (LOPRESSOR) 50 MG tablet Take 50 mg by mouth 2 (two) times daily.    [name] omeprazole (PRILOSEC) 40 MG capsule TAKE ONE CAPSULE BY MOUTH ONCE DAILY 90 capsule 0  . ONETOUCH DELICA LANCETS 50T MISC 1 each by Does not apply route daily. Test 1X per day and prn - DX 250.02 100 each 11   No current facility-administered medications for this visit.     Social History   Social History  . Marital status: Married    Spouse name: N/A  . Number of children: 3  . Years of education: N/A   Occupational History  . retired Disabled   Social History Main Topics  . Smoking status: Former Smoker    Quit date: 02/08/1969  . Smokeless tobacco: Former Systems developer  . Alcohol use No  . Drug use: No  . Sexual activity: Not on file   Other Topics Concern  . Not on file   Social History Narrative  . No narrative on file    Family History  Problem Relation Age of Onset  . Colon cancer Mother   . Diabetes Father     siblings  . Heart disease Father     brother  . Kidney disease Sister   . Colon polyps Neg Hx      ROS General: Negative; No fevers, chills, or night sweats HEENT: Negative; No changes in vision or hearing, sinus congestion, difficulty swallowing Pulmonary: Negative; No cough,  wheezing, shortness of breath, hemoptysis Cardiovascular: Negative; No chest pain, presyncope, syncope, palpatations GI: Negative; No nausea, vomiting, diarrhea, or abdominal pain GU: Negative; No dysuria, hematuria, or difficulty voiding Musculoskeletal: Negative; no myalgias, joint pain, or weakness Hematologic:  Negative; no easy bruising, bleeding Endocrine: Negative; no heat/cold intolerance Neuro: Negative; no changes in balance, headaches Skin: Negative; No rashes or skin lesions Psychiatric: Negative; No behavioral problems, depression Sleep: Positive for previously diagnosed obstructive sleep apnea.  Positive for occasional daytime sleepiness, no bruxism, restless legs, hypnogognic hallucinations, no cataplexy   Physical Exam BP (!) 142/78 (BP Location: Left Arm, Patient Position: Sitting, Cuff Size: Large)   Pulse 70   Ht 5' 7" (1.702 m)   Wt 270 lb 12.8 oz (122.8 kg)   BMI 42.41 kg/m    Repeat blood pressure by me 124/74.  Wt Readings from Last 3 Encounters:  10/16/15 270 lb 12.8 oz (122.8 kg)  09/17/15 274 lb (124.3 kg)  08/21/15 282 lb (127.9 kg)   General: Alert, oriented, no distress.  Skin: normal turgor, no rashes HEENT: Normocephalic, atraumatic. Pupils round and reactive; sclera anicteric; extraocular muscles intact; Fundi Disks flat.  No hemorrhages or exudates. Nose without nasal septal hypertrophy Mouth/Parynx benign; Mallinpatti scale 3 Neck: No JVD, no carotid bruits; faint 1/6 systolic murmur.  No S3 gallop.  No rubs thrills or heaves. Lungs: clear to ausculatation and percussion; no wheezing or rales  Chest wall: No tenderness to palpation Heart: RRR, s1 s2 normal  Abdomen: soft, nontender; no hepatosplenomehaly, BS+; abdominal aorta nontender and not dilated by palpation. Back: No CVA tenderness Pulses 2+ Extremities: no clubbinbg cyanosis or edema, Homan's sign negative  Neurologic: grossly nonfocal; cranial nerves intact. Psychological: Normal  affect and mood.  ECG (independently read by me): Not done today, but I reviewed his ECG from 09/17/2015 which showed normal sinus rhythm at 64.  There was poor anterior R-wave progression V1 through V3.  LABS:  BMP Latest Ref Rng & Units 08/21/2015 06/24/2015 05/02/2015  Glucose 65 - 99 mg/dL 154(H) 208(H) 171(H)  BUN 8 - 27 mg/dL _0 Creatinine 0.76 - 1.27 mg/dL 0.80 0.83 0.81  BUN/Creat Ratio 10 - 24 13 - 16  Sodium 134 - 144 mmol/L 141 140 140  Potassium 3.5 - 5.2 mmol/L 4.4 3.9 4.5  Chloride 96 - 106 mmol/L 96 100 95(L)  CO2 18 - 29 mmol/L _1 Calcium 8.6 - 10.2 mg/dL 9.0 9.0 9.8     Hepatic Function Latest Ref Rng & Units 08/21/2015 05/02/2015 10/15/2014  Total Protein 6.0 - 8.5 g/dL 6.8 7.0 6.9  Albumin 3.6 - 4.8 g/dL 4.3 4.4 4.2  AST 0 - 40 IU/L 34 21 19  ALT 0 - 44 IU/L 40 30 28  Alk Phosphatase 39 - 117 IU/L 60 64 66  Total Bilirubin 0.0 - 1.2 mg/dL 0.4 0.3 0.4  Bilirubin, Direct 0.0 - 0.3 mg/dL - - -     CBC Latest Ref Rng & Units 06/24/2015 05/02/2015 01/24/2014  WBC 4.0 - 10.5 K/uL 11.4(H) 9.8 9.0  Hemoglobin 13.0 - 17.0 g/dL 13.8 - 14.4  Hematocrit 39.0 - 52.0 % 41.9 45.0 43.1  Platelets 150.0 - 400.0 K/uL 265.0 267 312     Lipid Panel     Component Value Date/Time   CHOL 96 (L) 08/21/2015 1352   CHOL 111 06/12/2012 1232   TRIG 145 08/21/2015 1352   TRIG 141 01/24/2014 1143   TRIG 192 (H) 06/12/2012 1232   HDL 40 08/21/2015 1352   HDL 38 (L) 01/24/2014 1143   HDL 33 (L) 06/12/2012 1232   CHOLHDL 2.4 08/21/2015 1352   CHOLHDL 4.8 11/22/2006 1712   VLDL 39 11/22/2006 1712   LDLCALC  27 08/21/2015 1352   LDLCALC 27 10/25/2013 1224   LDLCALC 40 06/12/2012 1232     RADIOLOGY: No results found.    ASSESSMENT AND PLAN: Mr. Odel Schmid is a 70 year old gentleman who has a history of hypertension, diabetes mellitus, hyperlipidemia, and mild nonobstructive CAD.  Over 4 years ago a sleep study reportedly confirmed obstructive sleep apnea.  The  patient does not know the severity of his sleep apnea.  He had use CPAP initially but had difficulty with the full facemask and significant dry mouth.  He is never seen a sleep physician following his evaluation.  He brought his old machine to the office today.  His machine buns are not consistently working and he states that in the past.  His machine had malfunctioned.  He has a ResMed S9 VPAP auto unit which had been set at BiPAP 17/13.  In the office.  We'll change this to an auto mode with a pressure support of 4, a minimum EPAP of 6 and a maximum IPAP of 25.  In addition, we did turn the climate control to the auto mode.  I am  scheduling him for a sleep apnea reevaluation.  Will schedule him for a sleep study/titration study.  Of note, review of his medications seem to indicate that he is taking both interval, as well as chart.  He ends in addition to metformin for his diabetes mellitus.  I have recommended that he discontinue the interval, as long as he is taken, Giardia, and some in follow-up with his primary M.D.  I will see him in the sleep clinic in follow-up of his sleep study and with his new machine.  I answered all his questions.  Discussed the importance of weight loss with his morbid obesity and BMI of 42.4.   Time spent: 25 minutes  Troy Sine, MD, [facility]  10/18/2015 10:34 PM

## 2015-10-27 ENCOUNTER — Other Ambulatory Visit: Payer: Self-pay | Admitting: Family Medicine

## 2015-10-29 ENCOUNTER — Institutional Professional Consult (permissible substitution): Payer: PPO | Admitting: Pulmonary Disease

## 2015-11-03 ENCOUNTER — Other Ambulatory Visit: Payer: Self-pay | Admitting: Family Medicine

## 2015-11-11 ENCOUNTER — Other Ambulatory Visit: Payer: Self-pay | Admitting: Family Medicine

## 2015-11-13 ENCOUNTER — Ambulatory Visit (HOSPITAL_BASED_OUTPATIENT_CLINIC_OR_DEPARTMENT_OTHER): Payer: PPO | Attending: Cardiovascular Disease | Admitting: Cardiovascular Disease

## 2015-11-13 VITALS — Ht 67.0 in | Wt 270.0 lb

## 2015-11-13 DIAGNOSIS — Z79899 Other long term (current) drug therapy: Secondary | ICD-10-CM | POA: Insufficient documentation

## 2015-11-13 DIAGNOSIS — Z7984 Long term (current) use of oral hypoglycemic drugs: Secondary | ICD-10-CM | POA: Diagnosis not present

## 2015-11-13 DIAGNOSIS — G4730 Sleep apnea, unspecified: Secondary | ICD-10-CM

## 2015-11-13 DIAGNOSIS — G4733 Obstructive sleep apnea (adult) (pediatric): Secondary | ICD-10-CM

## 2015-11-13 DIAGNOSIS — R0683 Snoring: Secondary | ICD-10-CM | POA: Diagnosis not present

## 2015-11-13 DIAGNOSIS — I493 Ventricular premature depolarization: Secondary | ICD-10-CM | POA: Insufficient documentation

## 2015-11-15 NOTE — Procedures (Signed)
Patient Name: Kenneth Fuller, Kenneth Fuller Date: 11/13/2015 Gender: Male D.O.B: [birth year] Age (years): 4 Referring Provider: Shelva Majestic MD, ABSM Height (inches): 67 Interpreting Physician: Shelva Majestic MD, ABSM Weight (lbs): 274 RPSGT: Gerhard Perches BMI: 43 MRN: [id number] Neck Size: 18.75  CLINICAL INFORMATION Sleep Study Type: Split Night CPAP Indication for sleep study: OSA, Snoring Epworth Sleepiness Score: 5  SLEEP STUDY TECHNIQUE As per the AASM Manual for the Scoring of Sleep and Associated Events v2.3 (April 2016) with a hypopnea requiring 4% desaturations. The channels recorded and monitored were frontal, central and occipital EEG, electrooculogram (EOG), submentalis EMG (chin), nasal and oral airflow, thoracic and abdominal wall motion, anterior tibialis EMG, snore microphone, electrocardiogram, and pulse oximetry. Continuous positive airway pressure (CPAP) was initiated when the patient met split night criteria and was titrated according to treat sleep-disordered breathing.  MEDICATIONS albuterol (PROVENTIL HFA;VENTOLIN HFA) 108 (90 Base) MCG/ACT inhaler allopurinol (ZYLOPRIM) 100 MG tablet amLODipine (NORVASC) 10 MG tablet amLODipine (NORVASC) 10 MG tablet atorvastatin (LIPITOR) 40 MG tablet Blood Glucose Monitoring Suppl (PRODIGY BLOOD GLUCOSE MONITOR) W/DEVICE KIT canagliflozin (INVOKANA) 100 MG TABS tablet Cholecalciferol (VITAMIN D PO) empagliflozin (JARDIANCE) 10 MG TABS tablet fluticasone (FLONASE) 50 MCG/ACT nasal spray Fluticasone-Salmeterol (ADVAIR) 100-50 MCG/DOSE AEPB furosemide (LASIX) 40 MG tablet glucose blood test strip ibuprofen (ADVIL,MOTRIN) 800 MG tablet KLOR-CON M10 10 MEQ tablet metFORMIN (GLUCOPHAGE) 1000 MG tablet metoprolol (LOPRESSOR) 50 MG tablet omeprazole (PRILOSEC) 40 MG capsule ONETOUCH DELICA LANCETS 83M MISC  Medications administered by patient during sleep study : No sleep medicine administered.  RESPIRATORY  PARAMETERS Diagnostic Total AHI (/hr): 103.0 RDI (/hr): 103.9 OA Index (/hr): 98.3 CA Index (/hr): 4.7 REM AHI (/hr): N/A NREM AHI (/hr): 103.0               Supine AHI (/hr):  93.7    Non-supine AHI (/hr): 35.1   Min O2 Sat (%): 81.00 Mean O2 (%): 93.00   Time below 88% (min): 32.4      Titration Optimal Pressure (cm): 20 AHI at Optimal Pressure (/hr): 0.0 Min O2 at Optimal Pressure (%): 89.00 Supine % at Optimal (%):  Sleep % at Optimal (%):       SLEEP ARCHITECTURE The recording time for the entire night was 480.0 minutes. During a baseline period of 155.9. minutes, the patient slept for 146.3 minutes in REM and nonREM, yielding a sleep efficiency of  81.4%. Sleep onset after lights out was 9.7 minutes with a REM latency of  N/A. The patient spent 6.7% of the night in stage N1 sleep, 93.3% in stage N2 sleep, 0% in stage N3 and 0% in REM.   During the titration period of 255.6 minutes, the patient slept for 242.3 minutes in REM and nonREM, yielding a sleep efficiency of 87.2%. Sleep onset after CPAP initiation was 13.3 minutes with a REM latency of 41.5 minutes. The patient spent 4.3% of the night in stage N1 sleep, 48.9% in stage N2 sleep, 0% in stage N3 and 46.8% in REM.  CARDIAC DATA The 2 lead EKG demonstrated sinus rhythm. The mean heart rate was 100.00 beats per minute. Other EKG findings include: PVCs.  LEG MOVEMENT DATA The total Periodic Limb Movements of Sleep (PLMS) were 0 during the diagnostic and 200 during the therapeutic portions of the study. . The PLMS index was 53.9.  IMPRESSIONS               - Very severe severe sleep apnea with an AHI of 103.0/h  during the diagnostic portion of the split night study.  CPAP was titrated from 5 cm to 20 cm water pressure to achieve  optimal CPAP pressure. - Mild central sleep apnea occurred during the diagnostic portion of the study ([name] = 4.7/hour). - Abnormal sleep architecture with absence of Stage 3 slow wave sleep and REM sleep  during the diagnostic study. - Significant REM rebound with CPAP therapy. - Oxygen desaturation during the diagnostic portion of the study to a nadir of 81%. - Moderate snoring volume during the diagnostic portion of the study. - EKG findings include PVCs. - Mild periodic limb movements of sleep occurred during the study.  DIAGNOSIS - Obstructive Sleep Apnea (327.23 [G47.33 ICD-10])  RECOMMENDATIONS - Recommend an initial trial of CPAP therapy with an EPR of 3 at 20 cm H2O with heated humidification.  A Medium size Resmed Full Face Mask AirFit F20 mask was used for the titration. - Efforts should be made to optimize nasal and oropharyngeal patency. - If patient is symptomatic with restless legs consider a trial of pharmacotherapy. - Avoid alcohol, sedatives and other CNS depressants that may worsen sleep apnea and disrupt normal sleep architecture. - Sleep hygiene should be reviewed to assess factors that may improve sleep quality. - Weight management (BMI 43) and regular exercise should be initiated or continued. - Recommend a download be obtained after 4 weeks of therapy and sleep clinic evaluation.  [Electronically signed] 11/15/2015 04:05 PM  Shelva Majestic MD, [facility], [place], American Board of Sleep Medicine   NPI: [id number] [place] PH: [phone]   FX: [phone] [place]

## 2015-11-25 ENCOUNTER — Telehealth: Payer: Self-pay | Admitting: *Deleted

## 2015-11-25 NOTE — Telephone Encounter (Signed)
-----  Message from Kenneth Sine, MD sent at 11/15/2015  4:20 PM EDT ----- [name] please set up with dme for CPAP and f/u sleep clinic

## 2015-11-25 NOTE — Telephone Encounter (Signed)
Patient notified sleep study recommendations. Referral for CPAP made to [facility]. Patient already has follow up appointment scheduled in December.

## 2015-12-08 DIAGNOSIS — H2513 Age-related nuclear cataract, bilateral: Secondary | ICD-10-CM | POA: Diagnosis not present

## 2015-12-08 DIAGNOSIS — H40033 Anatomical narrow angle, bilateral: Secondary | ICD-10-CM | POA: Diagnosis not present

## 2015-12-08 LAB — HM DIABETES EYE EXAM

## 2015-12-23 ENCOUNTER — Ambulatory Visit (INDEPENDENT_AMBULATORY_CARE_PROVIDER_SITE_OTHER): Payer: PPO | Admitting: Family Medicine

## 2015-12-23 ENCOUNTER — Encounter: Payer: Self-pay | Admitting: Family Medicine

## 2015-12-23 VITALS — BP 133/87 | HR 70 | Temp 98.1°F | Ht 67.0 in | Wt 281.1 lb

## 2015-12-23 DIAGNOSIS — E1165 Type 2 diabetes mellitus with hyperglycemia: Secondary | ICD-10-CM

## 2015-12-23 DIAGNOSIS — R339 Retention of urine, unspecified: Secondary | ICD-10-CM

## 2015-12-23 DIAGNOSIS — G4700 Insomnia, unspecified: Secondary | ICD-10-CM

## 2015-12-23 DIAGNOSIS — Z0000 Encounter for general adult medical examination without abnormal findings: Secondary | ICD-10-CM

## 2015-12-23 DIAGNOSIS — E11620 Type 2 diabetes mellitus with diabetic dermatitis: Secondary | ICD-10-CM | POA: Diagnosis not present

## 2015-12-23 DIAGNOSIS — Z23 Encounter for immunization: Secondary | ICD-10-CM | POA: Diagnosis not present

## 2015-12-23 DIAGNOSIS — Z0001 Encounter for general adult medical examination with abnormal findings: Secondary | ICD-10-CM

## 2015-12-23 DIAGNOSIS — B3749 Other urogenital candidiasis: Secondary | ICD-10-CM

## 2015-12-23 DIAGNOSIS — IMO0002 Reserved for concepts with insufficient information to code with codable children: Secondary | ICD-10-CM

## 2015-12-23 LAB — BAYER DCA HB A1C WAIVED: HB A1C (BAYER DCA - WAIVED): 6.4 % (ref <7.0)

## 2015-12-23 MED ORDER — FLUCONAZOLE 150 MG PO TABS: 150.0000 mg | ORAL_TABLET | ORAL | 0 refills | Status: DC

## 2015-12-23 MED ORDER — MIRTAZAPINE 15 MG PO TABS: 15.0000 mg | ORAL_TABLET | Freq: Every day | ORAL | 3 refills | Status: DC

## 2015-12-23 NOTE — Progress Notes (Signed)
BP 133/87   Pulse 70   Temp 98.1 F (36.7 C) (Oral)   Ht _0  (1.702 m)   Wt 281 lb 2 oz (127.5 kg)   BMI 44.03 kg/m    Subjective:    Patient ID: Kenneth Fuller, male    DOB: [birth year], 70 y.o.   MRN: [id number]  HPI: Kenneth Fuller is a 70 y.o. male presenting on 12/23/2015 for Annual Exam; Insomnia; and Legs ache and move at night   HPI Adult well exam Patient is coming in today for an adult well exam and physical. He denies any chest pain, shortness of breath, headaches or vision issues, abdominal complaints, diarrhea, nausea, vomiting, or joint issues. The 2 complaints that he is most having is that he is having some problems with his penis and the foreskin that we'll come back anymore and urinary retention because of that. He is seen a urologist who checked his prostate did not think he was having prostate issues but I recommended for him to go back to the urologist for his issues with his foreskin. His frequent urination is waking him up at night which is causing him some issues with insomnia. He says he is also having issues at night with his legs twitching and moving and he feels like he has restless legs. He does have known sleep apnea and is most of the time using his treatment for that but he wants to know if there is something that could help him sleep better. He says the issue with his penis is been going on for couple months and the issues with this sleeping has been going on for 4 or 5 months. He is also due for recheck on his A1c for his diabetes.  Relevant past medical, surgical, family and social history reviewed and updated as indicated. Interim medical history since our last visit reviewed. Allergies and medications reviewed and updated.  Review of Systems  Constitutional: Negative for chills and fever.  Eyes: Negative for discharge.  Respiratory: Negative for shortness of breath and wheezing.   Cardiovascular: Negative for chest pain and leg swelling.    Genitourinary: Positive for difficulty urinating, dysuria, frequency and genital sores. Negative for discharge, scrotal swelling and testicular pain.  Musculoskeletal: Negative for back pain and gait problem.  Skin: Negative for rash.  Psychiatric/Behavioral: Positive for sleep disturbance.  All other systems reviewed and are negative.   Per HPI unless specifically indicated above     Medication List       Accurate as of 12/23/15 12:05 PM. Always use your most recent med list.          albuterol 108 (90 Base) MCG/ACT inhaler Commonly known as:  PROVENTIL HFA;VENTOLIN HFA Inhale 2 puffs into the lungs every 6 (six) hours as needed for wheezing or shortness of breath.   allopurinol 100 MG tablet Commonly known as:  ZYLOPRIM Take 1 tablet (100 mg total) by mouth daily.   amLODipine 10 MG tablet Commonly known as:  NORVASC TAKE 1/2 (5MG) BY MOUTH DAILY   atorvastatin 40 MG tablet Commonly known as:  LIPITOR TAKE ONE TABLET BY MOUTH ONCE DAILY   canagliflozin 100 MG Tabs tablet Commonly known as:  INVOKANA Take 1 tablet (100 mg total) by mouth daily before breakfast.   empagliflozin 10 MG Tabs tablet Commonly known as:  JARDIANCE Take 10 mg by mouth daily.   fluconazole 150 MG tablet Commonly known as:  DIFLUCAN Take 1 tablet (150 mg total) by  mouth once a week.   fluticasone 50 MCG/ACT nasal spray Commonly known as:  FLONASE Place 2 sprays into both nostrils daily.   Fluticasone-Salmeterol 100-50 MCG/DOSE Aepb Commonly known as:  ADVAIR Inhale 1 puff into the lungs 2 (two) times daily.   furosemide 40 MG tablet Commonly known as:  LASIX TAKE 1 BY MOUTH TWICE DAILY   glucose blood test strip Test 1X per day and prn- Dx Code E11.9. One Touch Ultra Test Strips   ibuprofen 800 MG tablet Commonly known as:  ADVIL,MOTRIN TAKE 1 (800MG TOTAL) BY MOUTH EVERY 8 HOURS AS NEEDED   KLOR-CON M10 10 MEQ tablet Generic drug:  potassium chloride TAKE ONE TABLET BY  MOUTH ONCE DAILY   metFORMIN 1000 MG tablet Commonly known as:  GLUCOPHAGE TAKE ONE TABLET BY MOUTH TWICE DAILY WITH A MEAL   metoprolol 50 MG tablet Commonly known as:  LOPRESSOR Take 50 mg by mouth 2 (two) times daily.   mirtazapine 15 MG tablet Commonly known as:  REMERON Take 1 tablet (15 mg total) by mouth at bedtime.   omeprazole 40 MG capsule Commonly known as:  PRILOSEC TAKE ONE CAPSULE BY MOUTH ONCE DAILY   ONETOUCH DELICA LANCETS 06C Misc 1 each by Does not apply route daily. Test 1X per day and prn - DX 250.02   PRODIGY BLOOD GLUCOSE MONITOR w/Device Kit 1 Units by Does not apply route daily. Check blood sugars 1X per day-- dx 250.02   VITAMIN D PO Take 1 tablet by mouth every morning.          Objective:    BP 133/87   Pulse 70   Temp 98.1 F (36.7 C) (Oral)   Ht _0  (1.702 m)   Wt 281 lb 2 oz (127.5 kg)   BMI 44.03 kg/m   Wt Readings from Last 3 Encounters:  12/23/15 281 lb 2 oz (127.5 kg)  11/13/15 270 lb (122.5 kg)  10/16/15 270 lb 12.8 oz (122.8 kg)    Physical Exam  Constitutional: He is oriented to person, place, and time. He appears well-developed and well-nourished. No distress.  Eyes: Conjunctivae are normal. Right eye exhibits no discharge. Left eye exhibits no discharge. No scleral icterus.  Cardiovascular: Normal rate, regular rhythm, normal heart sounds and intact distal pulses.   No murmur heard. Pulmonary/Chest: Effort normal and breath sounds normal. No respiratory distress. He has no wheezes. He has no rales.  Genitourinary: Testes normal. Uncircumcised. Penile erythema and penile tenderness (Patient has tenderness over his foreskin that is nonreducible over his penis.) present. No discharge found.  Musculoskeletal: Normal range of motion. He exhibits no edema.  Lymphadenopathy:       Right: No inguinal adenopathy present.       Left: No inguinal adenopathy present.  Neurological: He is alert and oriented to person, place, and  time. Coordination normal.  Skin: Skin is warm and dry. No rash noted. He is not diaphoretic.  Psychiatric: He has a normal mood and affect. His behavior is normal.  Nursing note and vitals reviewed.     Assessment & Plan:   Problem List Items Addressed This Visit    None    Visit Diagnoses    Well adult exam    -  Primary   Relevant Orders   PSA, total and free   Encounter for immunization       Relevant Orders   Flu Vaccine QUAD 36+ mos IM (Completed)   PSA, total and free  Candidal balano-posthitis       Relevant Medications   fluconazole (DIFLUCAN) 150 MG tablet   Uncontrolled type 2 diabetes mellitus with diabetic dermatitis, without long-term current use of insulin (HCC)       Relevant Medications   fluconazole (DIFLUCAN) 150 MG tablet   Other Relevant Orders   Bayer DCA Hb A1c Waived (Completed)   Insomnia, unspecified type       Relevant Medications   mirtazapine (REMERON) 15 MG tablet   Urinary retention       Relevant Orders   Ambulatory referral to Urology      Follow up plan: Return in about 3 months (around 03/24/2016), or if symptoms worsen or fail to improve, for Diabetes recheck.  Counseling provided for all of the vaccine components Orders Placed This Encounter  Procedures  . Flu Vaccine QUAD 36+ mos IM  . Bayer DCA Hb A1c Waived  . PSA, total and free  . Ambulatory referral to Urology    Caryl Pina, MD [facility] 12/23/2015, 12:05 PM

## 2015-12-24 LAB — PSA, TOTAL AND FREE
PSA, Free Pct: 60 %
PSA, Free: 0.06 ng/mL
Prostate Specific Ag, Serum: 0.1 ng/mL (ref 0.0–4.0)

## 2015-12-26 ENCOUNTER — Observation Stay (HOSPITAL_COMMUNITY)
Admission: EM | Admit: 2015-12-26 | Discharge: 2015-12-27 | Disposition: A | Discharge status: Home / Self Care | Payer: PPO | Attending: Student in an Organized Health Care Education/Training Program | Admitting: Student in an Organized Health Care Education/Training Program

## 2015-12-26 ENCOUNTER — Encounter (HOSPITAL_COMMUNITY): Payer: Self-pay | Admitting: Emergency Medicine

## 2015-12-26 ENCOUNTER — Emergency Department (HOSPITAL_COMMUNITY): Payer: PPO

## 2015-12-26 DIAGNOSIS — Z6841 Body Mass Index (BMI) 40.0 and over, adult: Secondary | ICD-10-CM | POA: Insufficient documentation

## 2015-12-26 DIAGNOSIS — E669 Obesity, unspecified: Secondary | ICD-10-CM | POA: Diagnosis not present

## 2015-12-26 DIAGNOSIS — W01198A Fall on same level from slipping, tripping and stumbling with subsequent striking against other object, initial encounter: Secondary | ICD-10-CM

## 2015-12-26 DIAGNOSIS — S0003XA Contusion of scalp, initial encounter: Secondary | ICD-10-CM | POA: Insufficient documentation

## 2015-12-26 DIAGNOSIS — M79602 Pain in left arm: Secondary | ICD-10-CM

## 2015-12-26 DIAGNOSIS — R40241 Glasgow coma scale score 13-15, unspecified time: Secondary | ICD-10-CM | POA: Diagnosis not present

## 2015-12-26 DIAGNOSIS — S46012A Strain of muscle(s) and tendon(s) of the rotator cuff of left shoulder, initial encounter: Secondary | ICD-10-CM | POA: Diagnosis not present

## 2015-12-26 DIAGNOSIS — Z79899 Other long term (current) drug therapy: Secondary | ICD-10-CM

## 2015-12-26 DIAGNOSIS — Z7951 Long term (current) use of inhaled steroids: Secondary | ICD-10-CM

## 2015-12-26 DIAGNOSIS — R300 Dysuria: Secondary | ICD-10-CM

## 2015-12-26 DIAGNOSIS — I2510 Atherosclerotic heart disease of native coronary artery without angina pectoris: Secondary | ICD-10-CM | POA: Diagnosis not present

## 2015-12-26 DIAGNOSIS — S12090A Other displaced fracture of first cervical vertebra, initial encounter for closed fracture: Secondary | ICD-10-CM | POA: Diagnosis not present

## 2015-12-26 DIAGNOSIS — Z7984 Long term (current) use of oral hypoglycemic drugs: Secondary | ICD-10-CM

## 2015-12-26 DIAGNOSIS — M75102 Unspecified rotator cuff tear or rupture of left shoulder, not specified as traumatic: Secondary | ICD-10-CM | POA: Insufficient documentation

## 2015-12-26 DIAGNOSIS — S59902A Unspecified injury of left elbow, initial encounter: Secondary | ICD-10-CM | POA: Diagnosis not present

## 2015-12-26 DIAGNOSIS — E785 Hyperlipidemia, unspecified: Secondary | ICD-10-CM | POA: Diagnosis not present

## 2015-12-26 DIAGNOSIS — Z800 Family history of malignant neoplasm of digestive organs: Secondary | ICD-10-CM

## 2015-12-26 DIAGNOSIS — S46892A Other injury of other muscles, fascia and tendons at shoulder and upper arm level, left arm, initial encounter: Secondary | ICD-10-CM

## 2015-12-26 DIAGNOSIS — Z8614 Personal history of Methicillin resistant Staphylococcus aureus infection: Secondary | ICD-10-CM | POA: Diagnosis not present

## 2015-12-26 DIAGNOSIS — W1830XA Fall on same level, unspecified, initial encounter: Secondary | ICD-10-CM | POA: Diagnosis not present

## 2015-12-26 DIAGNOSIS — J45909 Unspecified asthma, uncomplicated: Secondary | ICD-10-CM

## 2015-12-26 DIAGNOSIS — Z885 Allergy status to narcotic agent status: Secondary | ICD-10-CM

## 2015-12-26 DIAGNOSIS — S12000A Unspecified displaced fracture of first cervical vertebra, initial encounter for closed fracture: Principal | ICD-10-CM | POA: Diagnosis present

## 2015-12-26 DIAGNOSIS — S4992XA Unspecified injury of left shoulder and upper arm, initial encounter: Secondary | ICD-10-CM | POA: Diagnosis not present

## 2015-12-26 DIAGNOSIS — K219 Gastro-esophageal reflux disease without esophagitis: Secondary | ICD-10-CM | POA: Diagnosis not present

## 2015-12-26 DIAGNOSIS — R52 Pain, unspecified: Secondary | ICD-10-CM

## 2015-12-26 DIAGNOSIS — Y92002 Bathroom of unspecified non-institutional (private) residence single-family (private) house as the place of occurrence of the external cause: Secondary | ICD-10-CM | POA: Diagnosis not present

## 2015-12-26 DIAGNOSIS — E119 Type 2 diabetes mellitus without complications: Secondary | ICD-10-CM | POA: Insufficient documentation

## 2015-12-26 DIAGNOSIS — W19XXXA Unspecified fall, initial encounter: Secondary | ICD-10-CM

## 2015-12-26 DIAGNOSIS — Z888 Allergy status to other drugs, medicaments and biological substances status: Secondary | ICD-10-CM

## 2015-12-26 DIAGNOSIS — M542 Cervicalgia: Secondary | ICD-10-CM | POA: Diagnosis present

## 2015-12-26 DIAGNOSIS — I10 Essential (primary) hypertension: Secondary | ICD-10-CM | POA: Diagnosis not present

## 2015-12-26 DIAGNOSIS — S12091A Other nondisplaced fracture of first cervical vertebra, initial encounter for closed fracture: Secondary | ICD-10-CM | POA: Diagnosis not present

## 2015-12-26 DIAGNOSIS — S43492A Other sprain of left shoulder joint, initial encounter: Secondary | ICD-10-CM | POA: Diagnosis not present

## 2015-12-26 DIAGNOSIS — S41002A Unspecified open wound of left shoulder, initial encounter: Secondary | ICD-10-CM | POA: Diagnosis not present

## 2015-12-26 DIAGNOSIS — J449 Chronic obstructive pulmonary disease, unspecified: Secondary | ICD-10-CM | POA: Diagnosis not present

## 2015-12-26 DIAGNOSIS — M25512 Pain in left shoulder: Secondary | ICD-10-CM | POA: Diagnosis not present

## 2015-12-26 DIAGNOSIS — S0990XA Unspecified injury of head, initial encounter: Secondary | ICD-10-CM | POA: Diagnosis not present

## 2015-12-26 DIAGNOSIS — S199XXA Unspecified injury of neck, initial encounter: Secondary | ICD-10-CM | POA: Diagnosis not present

## 2015-12-26 DIAGNOSIS — Z8249 Family history of ischemic heart disease and other diseases of the circulatory system: Secondary | ICD-10-CM

## 2015-12-26 DIAGNOSIS — Z87891 Personal history of nicotine dependence: Secondary | ICD-10-CM | POA: Diagnosis not present

## 2015-12-26 LAB — URINALYSIS, ROUTINE W REFLEX MICROSCOPIC
BILIRUBIN URINE: NEGATIVE
Glucose, UA: >1000 mg/dL — AB
HGB URINE DIPSTICK: NEGATIVE
Ketones, ur: NEGATIVE mg/dL
Leukocytes, UA: NEGATIVE
Nitrite: NEGATIVE
PROTEIN: NEGATIVE mg/dL
SPECIFIC GRAVITY, URINE: 1.036 — AB (ref 1.005–1.030)
pH: 5 (ref 5.0–8.0)

## 2015-12-26 LAB — I-STAT CHEM 8, ED
BUN: 17 mg/dL (ref 6–20)
CREATININE: 0.7 mg/dL (ref 0.61–1.24)
Calcium, Ion: 1.11 mmol/L — ABNORMAL LOW (ref 1.15–1.40)
Chloride: 101 mmol/L (ref 101–111)
Glucose, Bld: 147 mg/dL — ABNORMAL HIGH (ref 65–99)
HEMATOCRIT: 44 % (ref 39.0–52.0)
HEMOGLOBIN: 15 g/dL (ref 13.0–17.0)
Potassium: 4.2 mmol/L (ref 3.5–5.1)
SODIUM: 141 mmol/L (ref 135–145)
TCO2: 30 mmol/L (ref 0–100)

## 2015-12-26 LAB — URINE MICROSCOPIC-ADD ON: RBC / HPF: NONE SEEN RBC/hpf (ref 0–5)

## 2015-12-26 LAB — CBC WITH DIFFERENTIAL/PLATELET
BASOS ABS: 0 10*3/uL (ref 0.0–0.1)
BASOS PCT: 0 %
EOS ABS: 0.2 10*3/uL (ref 0.0–0.7)
Eosinophils Relative: 2 %
HCT: 43 % (ref 39.0–52.0)
Hemoglobin: 13.9 g/dL (ref 13.0–17.0)
Lymphocytes Relative: 19 %
Lymphs Abs: 2 10*3/uL (ref 0.7–4.0)
MCH: 26.5 pg (ref 26.0–34.0)
MCHC: 32.3 g/dL (ref 30.0–36.0)
MCV: 82.1 fL (ref 78.0–100.0)
MONO ABS: 0.8 10*3/uL (ref 0.1–1.0)
MONOS PCT: 8 %
Neutro Abs: 7.4 10*3/uL (ref 1.7–7.7)
Neutrophils Relative %: 71 %
PLATELETS: 237 10*3/uL (ref 150–400)
RBC: 5.24 MIL/uL (ref 4.22–5.81)
RDW: 15.8 % — AB (ref 11.5–15.5)
WBC: 10.4 10*3/uL (ref 4.0–10.5)

## 2015-12-26 LAB — BASIC METABOLIC PANEL
ANION GAP: 10 (ref 5–15)
BUN: 13 mg/dL (ref 6–20)
CALCIUM: 8.9 mg/dL (ref 8.9–10.3)
CO2: 26 mmol/L (ref 22–32)
CREATININE: 0.85 mg/dL (ref 0.61–1.24)
Chloride: 104 mmol/L (ref 101–111)
Glucose, Bld: 150 mg/dL — ABNORMAL HIGH (ref 65–99)
Potassium: 4.1 mmol/L (ref 3.5–5.1)
SODIUM: 140 mmol/L (ref 135–145)

## 2015-12-26 LAB — TROPONIN I

## 2015-12-26 LAB — GLUCOSE, CAPILLARY
Glucose-Capillary: 123 mg/dL — ABNORMAL HIGH (ref 65–99)
Glucose-Capillary: 140 mg/dL — ABNORMAL HIGH (ref 65–99)

## 2015-12-26 MED ORDER — HYDROMORPHONE HCL 2 MG/ML IJ SOLN
1.0000 mg | INTRAMUSCULAR | Status: DC | PRN
  Administered 2015-12-26: 1 mg via INTRAVENOUS
  Filled 2015-12-26: qty 1

## 2015-12-26 MED ORDER — MOMETASONE FURO-FORMOTEROL FUM 100-5 MCG/ACT IN AERO
2.0000 | INHALATION_SPRAY | Freq: Two times a day (BID) | RESPIRATORY_TRACT | Status: DC
  Administered 2015-12-26 – 2015-12-27 (×3): 2 via RESPIRATORY_TRACT
  Filled 2015-12-26: qty 8.8

## 2015-12-26 MED ORDER — ONDANSETRON HCL 4 MG PO TABS: 4.0000 mg | ORAL_TABLET | Freq: Four times a day (QID) | ORAL | Status: DC | PRN

## 2015-12-26 MED ORDER — ALBUTEROL SULFATE (2.5 MG/3ML) 0.083% IN NEBU: 2.5000 mg | INHALATION_SOLUTION | RESPIRATORY_TRACT | Status: DC | PRN

## 2015-12-26 MED ORDER — DOCUSATE SODIUM 100 MG PO CAPS
100.0000 mg | ORAL_CAPSULE | Freq: Two times a day (BID) | ORAL | Status: DC
  Administered 2015-12-26 – 2015-12-27 (×3): 100 mg via ORAL
  Filled 2015-12-26 (×3): qty 1

## 2015-12-26 MED ORDER — HYDROMORPHONE HCL 2 MG/ML IJ SOLN
1.0000 mg | Freq: Once | INTRAMUSCULAR | Status: AC
  Administered 2015-12-26: 1 mg via INTRAVENOUS
  Filled 2015-12-26: qty 1

## 2015-12-26 MED ORDER — ATORVASTATIN CALCIUM 40 MG PO TABS
40.0000 mg | ORAL_TABLET | Freq: Every day | ORAL | Status: DC
  Administered 2015-12-26: 40 mg via ORAL
  Filled 2015-12-26: qty 1

## 2015-12-26 MED ORDER — SODIUM CHLORIDE 0.9 % IV SOLN
INTRAVENOUS | Status: DC
  Administered 2015-12-26: 08:00:00 via INTRAVENOUS

## 2015-12-26 MED ORDER — AMLODIPINE BESYLATE 5 MG PO TABS
5.0000 mg | ORAL_TABLET | Freq: Every day | ORAL | Status: DC
  Administered 2015-12-26 – 2015-12-27 (×2): 5 mg via ORAL
  Filled 2015-12-26 (×2): qty 1

## 2015-12-26 MED ORDER — MIRTAZAPINE 15 MG PO TABS: 15.0000 mg | ORAL_TABLET | Freq: Every day | ORAL | Status: DC

## 2015-12-26 MED ORDER — ACETAMINOPHEN 650 MG RE SUPP: 650.0000 mg | Freq: Four times a day (QID) | RECTAL | Status: DC | PRN

## 2015-12-26 MED ORDER — ACETAMINOPHEN 325 MG PO TABS: 650.0000 mg | ORAL_TABLET | Freq: Four times a day (QID) | ORAL | Status: DC | PRN

## 2015-12-26 MED ORDER — INSULIN ASPART 100 UNIT/ML ~~LOC~~ SOLN
0.0000 [IU] | Freq: Every day | SUBCUTANEOUS | Status: DC
  Administered 2015-12-26: 2 [IU] via SUBCUTANEOUS

## 2015-12-26 MED ORDER — METOPROLOL TARTRATE 50 MG PO TABS
50.0000 mg | ORAL_TABLET | Freq: Two times a day (BID) | ORAL | Status: DC
  Administered 2015-12-26 – 2015-12-27 (×3): 50 mg via ORAL
  Filled 2015-12-26: qty 1
  Filled 2015-12-26: qty 2
  Filled 2015-12-26: qty 1

## 2015-12-26 MED ORDER — INSULIN ASPART 100 UNIT/ML ~~LOC~~ SOLN
0.0000 [IU] | Freq: Three times a day (TID) | SUBCUTANEOUS | Status: DC
  Administered 2015-12-26 (×2): 2 [IU] via SUBCUTANEOUS
  Administered 2015-12-27: 3 [IU] via SUBCUTANEOUS
  Administered 2015-12-27: 2 [IU] via SUBCUTANEOUS

## 2015-12-26 MED ORDER — HYDROCODONE-ACETAMINOPHEN 5-325 MG PO TABS
1.0000 | ORAL_TABLET | Freq: Four times a day (QID) | ORAL | Status: DC | PRN
  Administered 2015-12-26: 1 via ORAL
  Administered 2015-12-26 – 2015-12-27 (×3): 2 via ORAL
  Filled 2015-12-26: qty 2
  Filled 2015-12-26 (×2): qty 1
  Filled 2015-12-26 (×2): qty 2

## 2015-12-26 MED ORDER — PANTOPRAZOLE SODIUM 40 MG PO TBEC
40.0000 mg | DELAYED_RELEASE_TABLET | Freq: Every day | ORAL | Status: DC
  Administered 2015-12-26 – 2015-12-27 (×2): 40 mg via ORAL
  Filled 2015-12-26 (×2): qty 1

## 2015-12-26 MED ORDER — SODIUM CHLORIDE 0.9 % IV SOLN
INTRAVENOUS | Status: AC
  Administered 2015-12-26 (×2): via INTRAVENOUS

## 2015-12-26 MED ORDER — HYDROMORPHONE HCL 2 MG/ML IJ SOLN
1.0000 mg | INTRAMUSCULAR | Status: DC | PRN
  Administered 2015-12-26 – 2015-12-27 (×2): 1 mg via INTRAVENOUS
  Filled 2015-12-26 (×2): qty 1

## 2015-12-26 MED ORDER — KETOROLAC TROMETHAMINE 15 MG/ML IJ SOLN
15.0000 mg | Freq: Four times a day (QID) | INTRAMUSCULAR | Status: DC
  Administered 2015-12-26 – 2015-12-27 (×5): 15 mg via INTRAVENOUS
  Filled 2015-12-26 (×5): qty 1

## 2015-12-26 MED ORDER — ONDANSETRON HCL 4 MG/2ML IJ SOLN: 4.0000 mg | Freq: Four times a day (QID) | INTRAMUSCULAR | Status: DC | PRN

## 2015-12-26 MED ORDER — ENOXAPARIN SODIUM 40 MG/0.4ML ~~LOC~~ SOLN
40.0000 mg | SUBCUTANEOUS | Status: DC
  Administered 2015-12-26: 40 mg via SUBCUTANEOUS
  Filled 2015-12-26 (×2): qty 0.4

## 2015-12-26 MED ORDER — FLUTICASONE PROPIONATE 50 MCG/ACT NA SUSP
2.0000 | Freq: Every day | NASAL | Status: DC
  Administered 2015-12-26 – 2015-12-27 (×2): 2 via NASAL
  Filled 2015-12-26: qty 16

## 2015-12-26 NOTE — ED Notes (Signed)
c-collar applied

## 2015-12-26 NOTE — ED Provider Notes (Signed)
10:56 AM Assumed care from Dr. Leonides Schanz, please see their note for full history, physical and decision making until this point. In brief this is a 70 y.o. year old male who presented to the ED tonight with Fall; Neck Pain; and Shoulder Pain     Possibly C1 fx on CT, significant neck pain. Awaiting MRI, neuro intact.  On my exam, has slight weakness of left arm and also a radicular pain down left arm when moving neck. Sensation intact.  NSG consulted, but likely non-surgical. They will see, but for now will recommend c collar and no surgery. Pain not well controlled on mujltiple IV doses of pain meds so discussed with medicine for obs admission for pain control.   Labs, studies and imaging reviewed by myself and considered in medical decision making if ordered. Imaging interpreted by radiology.  Labs Reviewed  CBC WITH DIFFERENTIAL/PLATELET - Abnormal; Notable for the following:       Result Value   RDW 15.8 (*)    All other components within normal limits  BASIC METABOLIC PANEL - Abnormal; Notable for the following:    Glucose, Bld 150 (*)    All other components within normal limits  I-STAT CHEM 8, ED - Abnormal; Notable for the following:    Glucose, Bld 147 (*)    Calcium, Ion 1.11 (*)    All other components within normal limits    MR Cervical Spine Wo Contrast  Final Result    CT CERVICAL SPINE WO CONTRAST  Final Result    CT Head Wo Contrast  Final Result    DG Elbow Complete Left  Final Result    DG Shoulder Left  Final Result    DG Humerus Left  Final Result      No Follow-up on file.    Merrily Pew, MD 12/26/15 1056

## 2015-12-26 NOTE — ED Notes (Addendum)
Pt's O2 read 85%-87% Nurse was notified. Placed pt on 2L nasal cannula. Pt's O2 now reads 96%.

## 2015-12-26 NOTE — Progress Notes (Signed)
Patient not wanting CPAP for tonight. Will call if he changes his mind. No issues at this time.

## 2015-12-26 NOTE — ED Notes (Signed)
Patient transported to MRI 

## 2015-12-26 NOTE — H&P (Signed)
Date: 12/26/2015               Patient Name:  Kenneth Fuller MRN: [id number]  DOB: 12/24/1945 Age / Sex: 70 y.o., male   PCP: Worthy Rancher, MD         Medical Service: Internal Medicine Teaching Service         Attending Physician: Dr. Axel Filler, MD    First Contact: Dr. Inda Castle Pager: [phone]  Second Contact: Dr. Benjamine Mola Pager: [phone]       After Hours (After 5p/  First Contact Pager: [phone]  weekends / holidays): Second Contact Pager: [phone]   Chief Complaint: fall  History of Present Illness: 70 year old man with history of CAD, DM2, GERD, HLD, HTN, OSA on CPAP, reactive airway disease presenting after fall. He woke up to use the bathroom. His room is dark. He has to get up often at night to use the restroom. He walked into his bathtub while trying to find the light switch. He hit his head on the tub. He thought he lost consciousness shortly after impact. He is uncertain how long he was unconscious for. When he awoke, he felt his left arm was numb and weak. He fell once before years ago - he did not lose consciousness at that time.  He was asymptomatic prior to falling. Denies post-ictal symptoms. No tongue biting. He did not have any urinary incontinence or stool incontinence. No recent illnesses. No fevers/chills, vision changes, cough, no dyspnea or chest pain, no nausea, vomiting. Occasional mild abdominal pain from taking metformin. No diarrhea. No hematochezia or melena. No hematuria. He has some dysuria. No edema.  He has dyspnea on exertion which has been stable. Denies chest pain with exertion.  Hypoxic to 85-87% on room air in ED. He was placed on 2L [place] and subsequent O2 sat 96%.  Meds:  Current Meds  Medication Sig  . albuterol (PROVENTIL HFA;VENTOLIN HFA) 108 (90 Base) MCG/ACT inhaler Inhale 2 puffs into the lungs every 6 (six) hours as needed for wheezing or shortness of breath.  [name] amLODipine (NORVASC) 10 MG tablet TAKE 1/2 (5MG) BY  MOUTH DAILY  . atorvastatin (LIPITOR) 40 MG tablet TAKE ONE TABLET BY MOUTH ONCE DAILY  . canagliflozin (INVOKANA) 100 MG TABS tablet Take 1 tablet (100 mg total) by mouth daily before breakfast.  . Cholecalciferol (VITAMIN D PO) Take 1 tablet by mouth every morning.  . empagliflozin (JARDIANCE) 10 MG TABS tablet Take 10 mg by mouth daily.  . fluconazole (DIFLUCAN) 150 MG tablet Take 1 tablet (150 mg total) by mouth once a week.  . fluticasone (FLONASE) 50 MCG/ACT nasal spray Place 2 sprays into both nostrils daily.  . Fluticasone-Salmeterol (ADVAIR) 100-50 MCG/DOSE AEPB Inhale 1 puff into the lungs 2 (two) times daily.  . furosemide (LASIX) 40 MG tablet TAKE 1 BY MOUTH TWICE DAILY (Patient taking differently: Take 40 mg by mouth daily. )  . KLOR-CON M10 10 MEQ tablet TAKE ONE TABLET BY MOUTH ONCE DAILY  . metFORMIN (GLUCOPHAGE) 1000 MG tablet TAKE ONE TABLET BY MOUTH TWICE DAILY WITH A MEAL  . metoprolol (LOPRESSOR) 50 MG tablet Take 50 mg by mouth 2 (two) times daily.  . mirtazapine (REMERON) 15 MG tablet Take 1 tablet (15 mg total) by mouth at bedtime.  [name] omeprazole (PRILOSEC) 40 MG capsule TAKE ONE CAPSULE BY MOUTH ONCE DAILY     Allergies: Allergies as of 12/26/2015 - Review Complete 12/26/2015  Allergen Reaction Noted  .  Amlodipine besy-benazepril hcl  06/24/2010  . Phenergan [promethazine hcl]  06/12/2012  . Hydrocodone Itching 02/13/2013   Past Medical History:  Diagnosis Date  . Allergy   . CAD (coronary artery disease)   . Diabetes mellitus   . Fibromyalgia   . GERD (gastroesophageal reflux disease)   . GI bleeding   . Gout   . Hyperlipidemia   . Hypertension   . Hypogonadism male   . MRSA cellulitis   . Neuropathy ([place])   . Obesity   . Shortness of breath dyspnea    with exertion   . Sleep apnea    cpap- 14   . Wheezing    no asthma diagnosis    Family History:  Father had MI at 19 Mother had colon cancer Older brother with thrombus in heart Younger  brother with CHF  Social History:  Quit cigarettes and chewing tobacco in 1971. Used tobacco products for 5 years. Quit alcohol in 1971. No illicits. 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  Review of Systems: A complete ROS was negative except as per HPI.   Physical Exam: Blood pressure 113/77, pulse 83, temperature 97.8 F (36.6 C), temperature source Oral, resp. rate 15, height _0  (1.702 m), weight 268 lb (121.6 kg), SpO2 95 %. General Apperance: NAD Head: Normocephalic, atraumatic Eyes: PERRL, EOMI, anicteric sclera Ears: Normal external ear canal Nose: Nares normal, septum midline, mucosa normal Throat: Lips, mucosa and tongue normal  Neck: Supple, trachea midline Back: No tenderness or bony abnormality  Lungs: Clear to auscultation bilaterally. No wheezes, rhonchi or rales. Breathing comfortably Chest Wall: Nontender, no deformity Heart: Regular rate and rhythm, no murmur/rub/gallop Abdomen: Soft, nontender, nondistended, no rebound/guarding Extremities: Warm and well perfused, no edema. Tenderness to palpation of upper left arm Pulses: 2+ throughout Skin: No rashes or lesions Neurologic: Alert and oriented x 3. CNII-XII intact. Normal strength and sensation  EKG: normal sinus rhythm, unchanged from previous EKG  XR left shoulder/humerus/elbow: Negative for acute fracture, dislocation or radiopaque foreign body.  CT Brain: No acute intracranial process. Small scalp contusion at the right frontal vertex. CT C Spine: Linear lucencies traversing the posterior ring of C1 bilaterally. MRI C Spine: A definite C1 fracture is not identified on the MRI but findings on the CT scan are real and there is abnormal prevertebral fluid and hemorrhage which would suggest a fracture. Central disc protrusion at C3-4 with mild mass effect on the ventral thecal sac and mild bilateral foraminal encroachment.  Assessment & Plan by Problem: 70 year old man with history of  CAD, DM2, GERD, HLD, HTN, OSA on CPAP, reactive airway disease presenting after fall.   Fall, neck pain: [place] this morning as he was trying to find the lightswitch and walked into the bathtub. He did lose consciousness. Syncope most consistent with neurology mediated etiology from pain from striking his head. CT head with no acute abnormalties. CT C spine concerning for C1 fracture and MRI without definite fracture but CT findings worrisome regardless. Neurosurgery was consulted by the ED. Etiology of fall likely from unsafe environment at home, and he will need to adjust his environment to ensure this does not happen in the future. Other factors that could contribute to his fall would be newly started mirtazepine. He is on two SGLT2 inhibitors and these have a risk for increasing falls as well -Appreciate neurosurgery recs -Aspen collar in place -Toradol 38m q6hr -Norco 5/3270m1-2 tablet q6 hr prn moderate pain, Dilaudid 6m40m2hr  prn severe pain -docusate 181m BID -PT/OT eval -Hold mirtazepine and both SGLT2 inhibitors for now -Incentive spirometer.   Dysuria: He is on two SGLT2 inhibitors which can increase risk of UTI. Recently evaluated by PCP for problems with his foreskin (phimosis) which is thought to be causing his urinary frequency. Thought to have candidal balano-posthitis. -Urology follow up as outpatient -Check post void residual -Check UA -Continue weekly fluconazole for two doses  DM2: Last A1c checked 11/14 6.4. -Hold metformin, canagliflozin, and empagliflozin -SSI, accuchecks  HTN: Continue home amlodipine, metoprolol tartrate.  HLD: Continue home Lipitor  Reactive airway disease: Stable presently. Continue home Advair as Dulera, albuterol prn  GERD: Continue Protonix  FEN: Carb mod VTE ppx: Lovenox Code status: FULL  Dispo: Admit patient to Observation with expected length of stay less than 2 midnights.  Signed: JMilagros Loll MD 12/26/2015, 11:04 AM    Pager: [phone]

## 2015-12-26 NOTE — Progress Notes (Signed)
Arrived to 6n19 from ED at this time, oriented to room and surroundings, denies nausea/pain at this time.

## 2015-12-26 NOTE — Progress Notes (Signed)
Orthopedic Tech Progress Note Patient Details:  Kenneth Fuller November 20, 1945 [id number]  Ortho Devices Type of Ortho Device: Arm sling Ortho Device/Splint Location: LUE Ortho Device/Splint Interventions: Ordered, Application   Braulio Bosch 12/26/2015, 11:05 PM

## 2015-12-26 NOTE — ED Notes (Signed)
Provider at bedside

## 2015-12-26 NOTE — ED Notes (Signed)
CT contacted for imaging

## 2015-12-26 NOTE — ED Triage Notes (Signed)
Patient got up this morning and fell and hit his head on the bathtub.  He also hurt his right shoulder, right arm.  He is holding right arm due to pain.  He also is having neck pain.  Movement gives more pain to patient.

## 2015-12-26 NOTE — ED Provider Notes (Signed)
TIME SEEN: 5:10 AM  CHIEF COMPLAINT: Mechanical fall  HPI: Pt is a 70 y.o. male with history of hypertension, diabetes, hyperlipidemia who presents emergency department after he had a fall in his bathroom. Reports he got up in the middle the night to use the restroom and tripped over 70 in the bathroom falling face forward and striking his head on the bathtub. States he did lose consciousness. Denies being on anticoagulation or antiplatelets. Is complaining of headache that is diffuse, neck pain, left humerus pain. No chest pain, abdominal pain, lower extremity pain, back pain. No numbness or focal weakness. Does have some shooting pains down the left arm and felt some numbness in his arm that has resolved. No focal weakness. Unable to move left arm is of pain.  ROS: See HPI Constitutional: no fever  Eyes: no drainage  ENT: no runny nose   Cardiovascular:  no chest pain  Resp: no SOB  GI: no vomiting GU: no dysuria Integumentary: no rash  Allergy: no hives  Musculoskeletal: no leg swelling  Neurological: no slurred speech ROS otherwise negative  PAST MEDICAL HISTORY/PAST SURGICAL HISTORY:  Past Medical History:  Diagnosis Date  . Allergy   . CAD (coronary artery disease)   . Diabetes mellitus   . Fibromyalgia   . GERD (gastroesophageal reflux disease)   . GI bleeding   . Gout   . Hyperlipidemia   . Hypertension   . Hypogonadism male   . MRSA cellulitis   . Neuropathy ([facility])   . Obesity   . Shortness of breath dyspnea    with exertion   . Sleep apnea    cpap- 14   . Wheezing    no asthma diagnosis    MEDICATIONS:  Prior to Admission medications   Medication Sig Start Date End Date Taking? Authorizing Provider  albuterol (PROVENTIL HFA;VENTOLIN HFA) 108 (90 Base) MCG/ACT inhaler Inhale 2 puffs into the lungs every 6 (six) hours as needed for wheezing or shortness of breath. 05/02/15   Joshua A [name], MD  allopurinol (ZYLOPRIM) 100 MG tablet Take 1 tablet (100 mg total)  by mouth daily. Patient not taking: Reported on 12/23/2015 05/02/15   Joshua A [name], MD  amLODipine (NORVASC) 10 MG tablet TAKE 1/2 (5MG) BY MOUTH DAILY 05/02/15   Joshua A [name], MD  atorvastatin (LIPITOR) 40 MG tablet TAKE ONE TABLET BY MOUTH ONCE DAILY 11/03/15   Joshua A [name], MD  Blood Glucose Monitoring Suppl (PRODIGY BLOOD GLUCOSE MONITOR) W/DEVICE KIT 1 Units by Does not apply route daily. Check blood sugars 1X per day-- dx 250.02 08/18/12   Mary-Margaret Martin, FNP  canagliflozin (INVOKANA) 100 MG TABS tablet Take 1 tablet (100 mg total) by mouth daily before breakfast. 08/23/15   Joshua A [name], MD  Cholecalciferol (VITAMIN D PO) Take 1 tablet by mouth every morning.    Historical Provider, MD  empagliflozin (JARDIANCE) 10 MG TABS tablet Take 10 mg by mouth daily. 08/25/15   Joshua A [name], MD  fluconazole (DIFLUCAN) 150 MG tablet Take 1 tablet (150 mg total) by mouth once a week. 12/23/15   Joshua A [name], MD  fluticasone (FLONASE) 50 MCG/ACT nasal spray Place 2 sprays into both nostrils daily. 05/15/15   Christy A Hawks, FNP  Fluticasone-Salmeterol (ADVAIR) 100-50 MCG/DOSE AEPB Inhale 1 puff into the lungs 2 (two) times daily. 05/02/15   Joshua A [name], MD  furosemide (LASIX) 40 MG tablet TAKE 1 BY MOUTH TWICE DAILY Patient taking differently: 40 mg daily. TAKE   1 BY MOUTH TWICE DAILY 05/02/15   Joshua A [name], MD  glucose blood test strip Test 1X per day and prn- Dx Code E11.9. One Touch Ultra Test Strips 11/08/13   Donald W Moore, MD  ibuprofen (ADVIL,MOTRIN) 800 MG tablet TAKE 1 (800MG TOTAL) BY MOUTH EVERY 8 HOURS AS NEEDED 11/11/14   Donald W Moore, MD  KLOR-CON M10 10 MEQ tablet TAKE ONE TABLET BY MOUTH ONCE DAILY 09/08/15   Joshua A [name], MD  metFORMIN (GLUCOPHAGE) 1000 MG tablet TAKE ONE TABLET BY MOUTH TWICE DAILY WITH A MEAL 10/28/15   Joshua A [name], MD  metoprolol (LOPRESSOR) 50 MG tablet Take 50 mg by mouth 2 (two) times daily.    Historical  Provider, MD  mirtazapine (REMERON) 15 MG tablet Take 1 tablet (15 mg total) by mouth at bedtime. 12/23/15   Joshua A [name], MD  omeprazole (PRILOSEC) 40 MG capsule TAKE ONE CAPSULE BY MOUTH ONCE DAILY 11/12/15   Joshua A [name], MD  ONETOUCH DELICA LANCETS 33G MISC 1 each by Does not apply route daily. Test 1X per day and prn - DX 250.02 08/19/12   Mary-Margaret Martin, FNP    ALLERGIES:  Allergies  Allergen Reactions  . Amlodipine Besy-Benazepril Hcl     Makes tongue swell  . Phenergan [Promethazine Hcl]     "I can't remember."  . Hydrocodone Itching    Can tolerate in low doses    SOCIAL HISTORY:  Social History  Substance Use Topics  . Smoking status: Former Smoker    Quit date: 02/08/1969  . Smokeless tobacco: Former User  . Alcohol use No    FAMILY HISTORY: Family History  Problem Relation Age of Onset  . Colon cancer Mother   . Diabetes Father     siblings  . Heart disease Father     brother  . Kidney disease Sister   . Colon polyps Neg Hx     EXAM: BP 162/98 (BP Location: Right Arm)   Pulse 93   Temp 97.8 F (36.6 C) (Oral)   Resp 17   Ht 5' 7" (1.702 m)   Wt 268 lb (121.6 kg)   SpO2 96%   BMI 41.97 kg/m  CONSTITUTIONAL: Alert and oriented and responds appropriately to questions. Obese, appears uncomfortable, GCS 15 HEAD: Normocephalic; atraumatic EYES: Conjunctivae clear, PERRL, EOMI ENT: normal nose; no rhinorrhea; moist mucous membranes; pharynx without lesions noted; no dental injury; no septal hematoma NECK: Supple, no meningismus, no LAD; patient does have diffuse midline spinal tenderness but no step-off or deformity; trachea midline, cervical collar in place CARD: RRR; S1 and S2 appreciated; no murmurs, no clicks, no rubs, no gallops RESP: Normal chest excursion without splinting or tachypnea; breath sounds clear and equal bilaterally; no wheezes, no rhonchi, no rales; no hypoxia or respiratory distress CHEST:  chest wall stable, no crepitus  or ecchymosis or deformity, nontender to palpation; no flail chest ABD/GI: Normal bowel sounds; non-distended; soft, non-tender, no rebound, no guarding; no ecchymosis or other lesions noted PELVIS:  stable, nontender to palpation BACK:  The back appears normal and is non-tender to palpation, there is no CVA tenderness; no midline spinal tenderness, step-off or deformity EXT: Tender throughout the left shoulder, left humerus and left elbow without obvious deformity. Exam is limited given his obesity. No ecchymosis or swelling. Decreased range of motion the left shoulder secondary to pain but normal range of motion in the left elbow and wrist. 2+ pulses bilaterally. Reports slightly diminished sensation at   the tip of the left third digit. No loss of fullness of the left shoulder. Otherwise Normal ROM in all joints; otherwise extremity is are non-tender to palpation; no edema; normal capillary refill; no cyanosis,  no joint effusion, compartments are soft, extremities are warm and well-perfused, no ecchymosis or lacerations    SKIN: Normal color for age and race; warm NEURO: Moves all extremities equally, sensation to light touch intact diffusely other than some numbness in the tip of the left third digit, cranial nerves II through XII intact PSYCH: The patient's mood and manner are appropriate. Grooming and personal hygiene are appropriate.  MEDICAL DECISION MAKING: Patient here with mechanical fall. Will obtain CT of his head, cervical spine, x-rays of his left shoulder, left humerus and left elbow. We'll provide Dilaudid for pain. He is currently in a cervical collar.  ED PROGRESS: 7:45 AM  X-rays of the left arm are unremarkable. CT of the head shows no acute abnormality. CT of the cervical spine shows lucencies of the C1 vertebra. Have discussed this with radiology who recommends noncontrast MRI. Discussed this with patient and family work on trouble with this plan. Will continue IV Dilaudid. He will  be kept NPO.  Signed out to Dr. Dayna Barker to follow-up on patient's MRI of his cervical spine.    I reviewed all nursing notes, vitals, pertinent old records, EKGs, labs, imaging (as available).     Goodman, DO 12/26/15 [phone]

## 2015-12-26 NOTE — ED Notes (Signed)
Pt taken to CT.

## 2015-12-26 NOTE — ED Notes (Signed)
Dr. Dayna Barker at bedside

## 2015-12-26 NOTE — ED Notes (Signed)
Admitting at bedside

## 2015-12-26 NOTE — Consult Note (Signed)
Reason for Consult: C1 fracture Referring Physician: Emergency department  Kenneth Fuller is an 69 y.o. male.  HPI: Kenneth Fuller is a 70 year old Kenneth Fuller with long-standing history of cervical lumbar spondylosis status post both cervical and lumbar fusion surgery Kenneth Fuller had a fall the bathroom last night and is complaining of severe shoulder pain which extends up into his neck workup has shown what appeared be nondisplaced C1 ring fractures with minimal prevertebral edema MRI scan shows no significant cord compression no significant cord injury mild to moderate foraminal stenosis from a chronic disc bulge at C3-4. Kenneth Fuller states most the pain is around the left shoulder.  Past Medical History:  Diagnosis Date  . Allergy   . CAD (coronary artery disease)   . Diabetes mellitus   . Fibromyalgia   . GERD (gastroesophageal reflux disease)   . GI bleeding   . Gout   . Hyperlipidemia   . Hypertension   . Hypogonadism male   . MRSA cellulitis   . Neuropathy ([place])   . Obesity   . Shortness of breath dyspnea    with exertion   . Sleep apnea    cpap- 14   . Wheezing    no asthma diagnosis    Past Surgical History:  Procedure Laterality Date  . BACK SURGERY    . CARDIAC CATHETERIZATION    . COLONOSCOPY    . LUMBAR LAMINECTOMY/DECOMPRESSION MICRODISCECTOMY N/A 01/02/2014   Procedure: CENTRAL DECOMPRESSION LUMBAR LAMINECTOMY L3-L4, L4-L5;  Surgeon: Tobi Bastos, MD;  Location: [facility];  Service: Orthopedics;  Laterality: N/A;  . neck fusion      Family History  Problem Relation Age of Onset  . Colon cancer Mother   . Diabetes Father     siblings  . Heart disease Father     brother  . Kidney disease Sister   . Colon polyps Neg Hx     Social History:  reports that Kenneth Fuller quit smoking about 46 years ago. Kenneth Fuller has quit using smokeless tobacco. Kenneth Fuller reports that Kenneth Fuller does not drink alcohol or use drugs.  Allergies:  Allergies  Allergen Reactions  . Amlodipine Besy-Benazepril Hcl     Makes  tongue swell  . Phenergan [Promethazine Hcl]     "I can't remember."  . Hydrocodone Itching    Can tolerate in low doses    Medications: I have reviewed the Kenneth Fuller's current medications.  Results for orders placed or performed during the hospital encounter of 12/26/15 (from the past 48 hour(s))  CBC with Differential     Status: Abnormal   Collection Time: 12/26/15 10:00 AM  Result Value Ref Range   WBC 10.4 4.0 - 10.5 K/uL   RBC 5.24 4.22 - 5.81 MIL/uL   Hemoglobin 13.9 13.0 - 17.0 g/dL   HCT 43.0 39.0 - 52.0 %   MCV 82.1 78.0 - 100.0 fL   MCH 26.5 26.0 - 34.0 pg   MCHC 32.3 30.0 - 36.0 g/dL   RDW 15.8 (H) 11.5 - 15.5 %   Platelets 237 150 - 400 K/uL   Neutrophils Relative % 71 %   Neutro Abs 7.4 1.7 - 7.7 K/uL   Lymphocytes Relative 19 %   Lymphs Abs 2.0 0.7 - 4.0 K/uL   Monocytes Relative 8 %   Monocytes Absolute 0.8 0.1 - 1.0 K/uL   Eosinophils Relative 2 %   Eosinophils Absolute 0.2 0.0 - 0.7 K/uL   Basophils Relative 0 %   Basophils Absolute 0.0 0.0 - 0.1 K/uL  Basic metabolic panel     Status: Abnormal   Collection Time: 12/26/15 10:00 AM  Result Value Ref Range   Sodium 140 135 - 145 mmol/L   Potassium 4.1 3.5 - 5.1 mmol/L   Chloride 104 101 - 111 mmol/L   CO2 26 22 - 32 mmol/L   Glucose, Bld 150 (H) 65 - 99 mg/dL   BUN 13 6 - 20 mg/dL   Creatinine, Ser 0.85 0.61 - 1.24 mg/dL   Calcium 8.9 8.9 - 10.3 mg/dL   GFR calc non Af Amer >60 >60 mL/min   GFR calc Af Amer >60 >60 mL/min    Comment: (NOTE) The eGFR has been calculated using the CKD EPI equation. This calculation has not been validated in all clinical situations. eGFR's persistently <60 mL/min signify possible Chronic Kidney Disease.    Anion gap 10 5 - 15  I-stat chem 8, ed     Status: Abnormal   Collection Time: 12/26/15 10:11 AM  Result Value Ref Range   Sodium 141 135 - 145 mmol/L   Potassium 4.2 3.5 - 5.1 mmol/L   Chloride 101 101 - 111 mmol/L   BUN 17 6 - 20 mg/dL   Creatinine, Ser 0.70  0.61 - 1.24 mg/dL   Glucose, Bld 147 (H) 65 - 99 mg/dL   Calcium, Ion 1.11 (L) 1.15 - 1.40 mmol/L   TCO2 30 0 - 100 mmol/L   Hemoglobin 15.0 13.0 - 17.0 g/dL   HCT 44.0 39.0 - 52.0 %    Dg Elbow Complete Left  Result Date: 12/26/2015 CLINICAL DATA:  [place] today when getting up to go to the bathroom. EXAM: LEFT ELBOW - COMPLETE 3+ VIEW COMPARISON:  None. FINDINGS: Negative for acute fracture or dislocation. Chronic fragmentation at the medial and lateral epicondyles. These are sequelae of remote trauma or inflammation. IMPRESSION: Negative for acute fracture, dislocation or radiopaque foreign body. Electronically Signed   By: Andreas Newport M.D.   On: 12/26/2015 06:38   Ct Head Wo Contrast  Result Date: 12/26/2015 CLINICAL DATA:  Initial evaluation for acute trauma, fall. EXAM: CT HEAD WITHOUT CONTRAST CT CERVICAL SPINE WITHOUT CONTRAST TECHNIQUE: Multidetector CT imaging of the head and cervical spine was performed following the standard protocol without intravenous contrast. Multiplanar CT image reconstructions of the cervical spine were also generated. COMPARISON:  Prior MRI from 01/31/2010. FINDINGS: CT HEAD FINDINGS Brain: Mild cerebral atrophy with chronic small vessel ischemic disease. No acute intracranial hemorrhage. No evidence for acute large vessel territory infarct. No mass lesion, midline shift or mass effect. No hydrocephalus. No extra-axial fluid collection. Vascular: No hyperdense vessel. Skull: Small soft tissue contusion at the right frontal scalp. No calvarial fracture. Sinuses/Orbits: Globes and orbital soft tissues within normal limits. Paranasal sinuses and mastoid air cells are clear. CT CERVICAL SPINE FINDINGS Alignment: Straightening of the normal cervical lordosis. No listhesis. Skull base and vertebrae: Normal skullbase articulations preserved. Occipital condyles intact. Normal C1-2 articulations preserved. Dens is intact. There are linear lucencies traversing the  posterior ring of C1 bilaterally (series 301, image 17). These are somewhat age indeterminate. Vertebral body heights maintained. No other acute fracture. Kenneth Fuller is status post ACDF at C4 through C7. Hardware intact without complication. There is complete bony osseous fusion at these levels. Soft tissues and spinal canal: Visualized soft tissues of the neck demonstrate no acute abnormality. No significant prevertebral edema. Disc levels: Status post ACDF at C4 through C7. Complete osseous fusion with bony incorporation at these levels. Mild diffuse  congenital canal stenosis. Upper chest: Visualized upper mediastinum within normal limits. Visualized lung apices are clear. No apical pneumothorax. IMPRESSION: CT BRAIN: 1. No acute intracranial process. 2. Small scalp contusion at the right frontal vertex. 3. Mild age-related cerebral atrophy with chronic small vessel ischemic disease. CT CERVICAL SPINE: 1. Linear lucencies traversing the posterior ring of C1 bilaterally. While these are favored to be chronic in nature, these are somewhat age indeterminate, with possible acute fractures not entirely excluded. Further evaluation with MRI is recommended to assess the chronicity of this finding. 2. No other acute traumatic injury within the cervical spine. 3. Status post ACDF at C4 through C7 without complication. Results were called by telephone at the time of interpretation on 12/26/2015 at 6:47 am to Dr. Pryor Curia , who verbally acknowledged these results. Electronically Signed   By: Jeannine Boga M.D.   On: 12/26/2015 06:55   Ct Cervical Spine Wo Contrast  Result Date: 12/26/2015 CLINICAL DATA:  Initial evaluation for acute trauma, fall. EXAM: CT HEAD WITHOUT CONTRAST CT CERVICAL SPINE WITHOUT CONTRAST TECHNIQUE: Multidetector CT imaging of the head and cervical spine was performed following the standard protocol without intravenous contrast. Multiplanar CT image reconstructions of the cervical spine  were also generated. COMPARISON:  Prior MRI from 01/31/2010. FINDINGS: CT HEAD FINDINGS Brain: Mild cerebral atrophy with chronic small vessel ischemic disease. No acute intracranial hemorrhage. No evidence for acute large vessel territory infarct. No mass lesion, midline shift or mass effect. No hydrocephalus. No extra-axial fluid collection. Vascular: No hyperdense vessel. Skull: Small soft tissue contusion at the right frontal scalp. No calvarial fracture. Sinuses/Orbits: Globes and orbital soft tissues within normal limits. Paranasal sinuses and mastoid air cells are clear. CT CERVICAL SPINE FINDINGS Alignment: Straightening of the normal cervical lordosis. No listhesis. Skull base and vertebrae: Normal skullbase articulations preserved. Occipital condyles intact. Normal C1-2 articulations preserved. Dens is intact. There are linear lucencies traversing the posterior ring of C1 bilaterally (series 301, image 17). These are somewhat age indeterminate. Vertebral body heights maintained. No other acute fracture. Kenneth Fuller is status post ACDF at C4 through C7. Hardware intact without complication. There is complete bony osseous fusion at these levels. Soft tissues and spinal canal: Visualized soft tissues of the neck demonstrate no acute abnormality. No significant prevertebral edema. Disc levels: Status post ACDF at C4 through C7. Complete osseous fusion with bony incorporation at these levels. Mild diffuse congenital canal stenosis. Upper chest: Visualized upper mediastinum within normal limits. Visualized lung apices are clear. No apical pneumothorax. IMPRESSION: CT BRAIN: 1. No acute intracranial process. 2. Small scalp contusion at the right frontal vertex. 3. Mild age-related cerebral atrophy with chronic small vessel ischemic disease. CT CERVICAL SPINE: 1. Linear lucencies traversing the posterior ring of C1 bilaterally. While these are favored to be chronic in nature, these are somewhat age indeterminate, with  possible acute fractures not entirely excluded. Further evaluation with MRI is recommended to assess the chronicity of this finding. 2. No other acute traumatic injury within the cervical spine. 3. Status post ACDF at C4 through C7 without complication. Results were called by telephone at the time of interpretation on 12/26/2015 at 6:47 am to Dr. Pryor Curia , who verbally acknowledged these results. Electronically Signed   By: Jeannine Boga M.D.   On: 12/26/2015 06:55   Mr Cervical Spine Wo Contrast  Result Date: 12/26/2015 CLINICAL DATA:  [place] last evening. Neck and left shoulder pain. Followup CT scan. Possible C1 fractures. EXAM: MRI CERVICAL SPINE  WITHOUT CONTRAST TECHNIQUE: Multiplanar, multisequence MR imaging of the cervical spine was performed. No intravenous contrast was administered. COMPARISON:  CT cervical spine 12/26/2015 FINDINGS: Alignment: Normal Vertebrae: Surgical changes with artifact from anterior plate and screws fusing C4-C7. No obvious fracture or bone lesion. Cord: Normal Posterior Fossa, vertebral arteries, paraspinal tissues: There is prevertebral soft tissue swelling/hematoma along the upper cervical region. Disc levels: C2-3:  No significant findings. C3-4: Focal central disc protrusion with mild mass effect on the ventral thecal sac. Mild foraminal encroachment bilaterally due to shallow disc osteophyte complexes. C4-5: Anterior and interbody fusion changes. Mild osteophytic ridging posteriorly but no significant spinal or foraminal stenosis. C5-6: Anterior and interbody fusion changes. Minimal osteophytic ridging but no spinal stenosis. Mild left foraminal encroachment due to uncinate spurring. C6-7: Anterior and interbody fusion changes. Mild osteophytic ridging but no significant spinal or foraminal stenosis. C7-T1:  No significant findings. IMPRESSION: 1. A definite C1 fracture is not identified on the MRI but I believe the findings on the CT scan are real and there is  abnormal prevertebral fluid and hemorrhage which would suggest a fracture. 2. Central disc protrusion at C3-4 with mild mass effect on the ventral thecal sac and mild bilateral foraminal encroachment. 3. Solid fusion changes from D2-K0 without complicating features. 4. Normal appearance of the cervical spinal cord. Electronically Signed   By: Marijo Sanes M.D.   On: 12/26/2015 09:41   Dg Shoulder Left  Result Date: 12/26/2015 CLINICAL DATA:  [place] today when getting up to go to the bathroom. EXAM: LEFT SHOULDER - 2+ VIEW COMPARISON:  None. FINDINGS: Negative for acute fracture or dislocation. Moderate degenerative AC joint changes are present. There is no bone lesion or bony destruction. IMPRESSION: Negative for acute fracture. Electronically Signed   By: Andreas Newport M.D.   On: 12/26/2015 06:38   Dg Humerus Left  Result Date: 12/26/2015 CLINICAL DATA:  [place] today when getting up to go to the bathroom. EXAM: LEFT HUMERUS - 2+ VIEW COMPARISON:  None. FINDINGS: There is no evidence of fracture or other focal bone lesions. Soft tissues are unremarkable. IMPRESSION: Negative. Electronically Signed   By: Andreas Newport M.D.   On: 12/26/2015 06:37    Review of Systems  Musculoskeletal: Positive for joint pain, myalgias and neck pain.  All other systems reviewed and are negative.  Blood pressure 143/73, pulse 73, temperature 97.8 F (36.6 C), temperature source Oral, resp. rate 21, height _0  (1.702 m), weight 121.6 kg (268 lb), SpO2 97 %. Physical Exam  Neurological: GCS eye subscore is 4. GCS verbal subscore is 5. GCS motor subscore is 6.  Kenneth Fuller is awake and alert pupils are equal extremities are intact Kenneth Fuller is wearing cervical collar unable to assess range of motion his neck strength in the right upper extremity is 5 out of 5 deltoid, bicep, tricep, wrist flexion, wrist extensor, hand intrinsics left upper extremity is pain limited secondary to Inc. severe pain left shoulder Kenneth Fuller denies any  numbness and tingling in his hand and fingers tricep appears to be strong grip is strong wrist flexion extension is strong bicep and deltoid are unable to be assessed because of the pain. Lower extremities are 5 out of 5    Assessment/Plan: 70 year old Kenneth Fuller sustained a fall and a C1 ring fracture. Aspirin collar for 3 months maintained at all times I recommend having orthopedics look at his left shoulder this is not consistent with radiculopathy. MRI scan shows no significant cord injury and no significant  foraminal stenosis related to any acute injury. Kenneth Fuller has some chronic spondylosis at C3-4 above his previous cervical fusion. Continue with maintaining aspirin collar at all times physical therapy and pain management.  Retina Bernardy P 12/26/2015, 3:33 PM

## 2015-12-26 NOTE — ED Notes (Signed)
Pt diaphoretic, cold wash cloth applied to his head.

## 2015-12-27 DIAGNOSIS — W01198A Fall on same level from slipping, tripping and stumbling with subsequent striking against other object, initial encounter: Secondary | ICD-10-CM | POA: Diagnosis not present

## 2015-12-27 DIAGNOSIS — S12091A Other nondisplaced fracture of first cervical vertebra, initial encounter for closed fracture: Secondary | ICD-10-CM | POA: Diagnosis not present

## 2015-12-27 DIAGNOSIS — Y92002 Bathroom of unspecified non-institutional (private) residence single-family (private) house as the place of occurrence of the external cause: Secondary | ICD-10-CM | POA: Diagnosis not present

## 2015-12-27 DIAGNOSIS — S12090A Other displaced fracture of first cervical vertebra, initial encounter for closed fracture: Secondary | ICD-10-CM | POA: Diagnosis not present

## 2015-12-27 DIAGNOSIS — S12000A Unspecified displaced fracture of first cervical vertebra, initial encounter for closed fracture: Secondary | ICD-10-CM | POA: Diagnosis present

## 2015-12-27 DIAGNOSIS — S46012A Strain of muscle(s) and tendon(s) of the rotator cuff of left shoulder, initial encounter: Secondary | ICD-10-CM | POA: Diagnosis not present

## 2015-12-27 LAB — CBC
HCT: 44.4 % (ref 39.0–52.0)
Hemoglobin: 13.7 g/dL (ref 13.0–17.0)
MCH: 25.9 pg — ABNORMAL LOW (ref 26.0–34.0)
MCHC: 30.9 g/dL (ref 30.0–36.0)
MCV: 84.1 fL (ref 78.0–100.0)
PLATELETS: 270 10*3/uL (ref 150–400)
RBC: 5.28 MIL/uL (ref 4.22–5.81)
RDW: 15.8 % — AB (ref 11.5–15.5)
WBC: 10.8 10*3/uL — AB (ref 4.0–10.5)

## 2015-12-27 LAB — BASIC METABOLIC PANEL
Anion gap: 10 (ref 5–15)
BUN: 13 mg/dL (ref 6–20)
CO2: 25 mmol/L (ref 22–32)
CREATININE: 0.85 mg/dL (ref 0.61–1.24)
Calcium: 8.4 mg/dL — ABNORMAL LOW (ref 8.9–10.3)
Chloride: 102 mmol/L (ref 101–111)
GFR calc Af Amer: >60 mL/min (ref >60)
GLUCOSE: 140 mg/dL — AB (ref 65–99)
Potassium: 4 mmol/L (ref 3.5–5.1)
SODIUM: 137 mmol/L (ref 135–145)

## 2015-12-27 LAB — GLUCOSE, CAPILLARY
GLUCOSE-CAPILLARY: 175 mg/dL — AB (ref 65–99)
Glucose-Capillary: 126 mg/dL — ABNORMAL HIGH (ref 65–99)

## 2015-12-27 MED ORDER — MUPIROCIN 2 % EX OINT
1.0000 "application " | TOPICAL_OINTMENT | Freq: Two times a day (BID) | CUTANEOUS | Status: DC
  Administered 2015-12-27: 1 via NASAL
  Filled 2015-12-27: qty 22

## 2015-12-27 MED ORDER — CHLORHEXIDINE GLUCONATE CLOTH 2 % EX PADS: 6.0000 | MEDICATED_PAD | Freq: Every day | CUTANEOUS | Status: DC

## 2015-12-27 MED ORDER — OXYCODONE HCL 5 MG PO TABS: 5.0000 mg | ORAL_TABLET | Freq: Four times a day (QID) | ORAL | 0 refills | Status: AC | PRN

## 2015-12-27 NOTE — Care Management Note (Signed)
Case Management Note  Patient Details  Name: Kenneth Fuller MRN: [id number] Date of Birth: [birth year]  Subjective/Objective:                  C1 fracture and rotator cuff injury following mechanical fall Action/Plan: Discharge planning Expected Discharge Date:                  Expected Discharge Plan:  [place]  In-House Referral:     Discharge planning Services  CM Consult  Post Acute Care Choice:    Choice offered to:  Patient  DME Arranged:  N/A DME Agency:  NA  HH Arranged:  Patient Refused [place] Agency:  NA  Status of Service:  Completed, signed off  If discussed at [employer] of Stay Meetings, dates discussed:    Additional Comments: CM met with pt in room to offer choice of home health agency. Pt declines all [facility] services.  Pt states he has walker, cane at home and denies need for additional DME. No other CM needs were communicated. Dellie Catholic, RN 12/27/2015, 4:17 PM

## 2015-12-27 NOTE — Progress Notes (Signed)
   Subjective: No events overnight.  Pain 4/10, worse in his shoulder while trying to sleep.  No numbness or weakness in arms or legs.  Objective:  Vital signs in last 24 hours: Vitals:   12/26/15 2121 12/26/15 2129 12/27/15 0551 12/27/15 0847  BP: (!) 117/57  116/62   Pulse: 68  76   Resp: 19  18   Temp: 97.7 F (36.5 C)  98.5 F (36.9 C)   TempSrc: Oral  Oral   SpO2: 96% 97% 97% 97%  Weight:      Height:       Physical Exam  Constitutional: He is oriented to person, place, and time.  Obese man in rigid collar and left arm in sling In no distress, occasionally wincing in pain  Eyes:  L subconj heme  Neck:  Immobilized in rigid collar  Cardiovascular: Normal rate and regular rhythm.   Pulmonary/Chest: Effort normal and breath sounds normal.  Neurological: He is alert and oriented to person, place, and time.  Psychiatric: He has a normal mood and affect. His behavior is normal.    Assessment/Plan:  Active Problems:   Neck pain   C1 cervical fracture ([place])  70 year old man with stable C1 fracture and rotator cuff injury following mechanical fall.  His pain is controlled and no surgery is indicated.  #Cervical Fracture Stable C1 ring fracture.  Neurosurgery evaluated, no surgery indicated, conservative management with Aspen collar for 3 months. -continue C collar -continue PRN Norco and Toradol  #Rotator Cuff Tear Avulsion of left subscapularis tendon on bedside ultrasound. -Pain management as above -Mobilize shoulder -Outpatient PT referral  #Diabetes -Hold metformin, canagliflozin, and empagliflozin -SSI  #HTN -Continue home amlodipine, metoprolol  #COPD -Continue home Advair and Dulera  Dispo: Anticipated discharge today.   Minus Liberty, MD 12/27/2015, 10:11 AM Pager: [phone]

## 2015-12-27 NOTE — Discharge Summary (Signed)
Name: Kenneth Fuller MRN: [id number] DOB: [birth year] 70 y.o. PCP: Worthy Rancher, MD  Date of Admission: 12/26/2015  4:48 AM Date of Discharge: 12/27/2015 4:43 PM Attending Physician: Lalla Brothers, MD  Discharge Diagnosis:  Active Problems:   Neck pain   C1 cervical fracture ([place])   Avulsion of left subscapularis   Discharge Medications:   Medication List    STOP taking these medications   allopurinol 100 MG tablet Commonly known as:  ZYLOPRIM   canagliflozin 100 MG Tabs tablet Commonly known as:  INVOKANA   ibuprofen 800 MG tablet Commonly known as:  ADVIL,MOTRIN   mirtazapine 15 MG tablet Commonly known as:  REMERON     TAKE these medications   albuterol 108 (90 Base) MCG/ACT inhaler Commonly known as:  PROVENTIL HFA;VENTOLIN HFA Inhale 2 puffs into the lungs every 6 (six) hours as needed for wheezing or shortness of breath.   amLODipine 10 MG tablet Commonly known as:  NORVASC TAKE 1/2 (5MG) BY MOUTH DAILY   atorvastatin 40 MG tablet Commonly known as:  LIPITOR TAKE ONE TABLET BY MOUTH ONCE DAILY   empagliflozin 10 MG Tabs tablet Commonly known as:  JARDIANCE Take 10 mg by mouth daily.   fluconazole 150 MG tablet Commonly known as:  DIFLUCAN Take 1 tablet (150 mg total) by mouth once a week.   fluticasone 50 MCG/ACT nasal spray Commonly known as:  FLONASE Place 2 sprays into both nostrils daily.   Fluticasone-Salmeterol 100-50 MCG/DOSE Aepb Commonly known as:  ADVAIR Inhale 1 puff into the lungs 2 (two) times daily.   furosemide 40 MG tablet Commonly known as:  LASIX TAKE 1 BY MOUTH TWICE DAILY What changed:  how much to take  how to take this  when to take this  additional instructions   glucose blood test strip Test 1X per day and prn- Dx Code E11.9. One Touch Ultra Test Strips   KLOR-CON M10 10 MEQ tablet Generic drug:  potassium chloride TAKE ONE TABLET BY MOUTH ONCE DAILY   metFORMIN 1000 MG tablet Commonly known  as:  GLUCOPHAGE TAKE ONE TABLET BY MOUTH TWICE DAILY WITH A MEAL   metoprolol 50 MG tablet Commonly known as:  LOPRESSOR Take 50 mg by mouth 2 (two) times daily.   omeprazole 40 MG capsule Commonly known as:  PRILOSEC TAKE ONE CAPSULE BY MOUTH ONCE DAILY   ONETOUCH DELICA LANCETS 31S Misc 1 each by Does not apply route daily. Test 1X per day and prn - DX 250.02   oxyCODONE 5 MG immediate release tablet Commonly known as:  Oxy IR/ROXICODONE Take 1 tablet (5 mg total) by mouth every 6 (six) hours as needed for severe pain.   PRODIGY BLOOD GLUCOSE MONITOR w/Device Kit 1 Units by Does not apply route daily. Check blood sugars 1X per day-- dx 250.02   VITAMIN D PO Take 1 tablet by mouth every morning.       Disposition and follow-up:   Kenneth Fuller was discharged from [facility] in Stable condition.  At the hospital follow up visit please address:  1.  Diabetes.  Please continue to manage hypoglycemics.  Discontinued Invokana on discharge since he reported two SGLT2 inhibitors.  2.  Rotator Cuff tear.  Assess improvement in pain and function.  3.  C1 fracture.  Ensure no neurologic symptoms to suggest cord compression have developed.  Emphasize importance of wearing collar at all times.  Ensure appropriate neurosurgery follow-up.  4.  Labs /  imaging needed at time of follow-up: none  5.  Pending labs/ test needing follow-up: none  Follow-up Appointments: Follow-up Information    CRAM,GARY P, MD. Schedule an appointment as soon as possible for a visit in 2 month(s).   Specialty:  Neurosurgery Why:  call them to make appointment in 2-3 months Contact information: [address] [place] [place] [address] [phone]        Worthy Rancher, MD. Schedule an appointment as soon as possible for a visit in 2 week(s).   Specialties:  Family Medicine, Cardiology Why:  Call to make appointment in 1-2 weeks Contact information: [place] [address] [phone]           Hospital Course by problem list:   1. Nondisplaced C1 Fracture Kenneth Fuller presented to the ED after a mechanical fall head-first into his bathtub.  He was in his usually state of health and woke up to urinate in the night and tripped; denied any presyncopal symptoms, palpitations, weakness, or any other symptoms prior to the fall.  He may have lost consciousness, and when he woke his neck and left arm were painful, and his left arm also felt numb and weak.  He was placed in a cervical collar, and CT and MRI of his C-spine found a non-displaced ring fracture of C1 and no bony injuries to his left arm.  His neurologic exam was nonfocal, with strength of left arm limited by pain. Neurosurgery evaluated him and determined that surgery was not indicated, recommended continuous immobilization for 3 months with Aspen collar.  He was observed overnight and pain was controlled with opiates, NSAIDs, and acetaminophen.  Mirtazapine, which was recently started for sleep, was discontinued on discharge in case it contributed to his nocturnal fall.  2. Left Subscapularis Tendon Tear In addition to neck pain, pain in his left shoulder and upper arm was his other principal complaint.  The shoulder was diffusely tender over the glenohumeral joint and lateral humerus, and strength exam was limited by pain.  Bedside ultrasound revealed avulsion of the subscapularis tendon.  Pain was controlled with opiates, NSAIDs, and acetaminophen.  He was evaluated by PT, and recommended to continue PT as outpatient.   3. Diabetes He reported a home regimen of metformin, canagliflozin, and empagliflozin.  His oral hypoglycemics were held and blood sugars controlled with sliding scale corrective insulin as an inpatient.  On discharge, he was instructed to discontinue canagliflozin due to redundancy of SGLT2 inhibitors.  Discharge Vitals:   BP (!) 141/59 (BP Location: Right  Arm)   Pulse 67   Temp 97.5 F (36.4 C) (Oral)   Resp 18   Ht _0  (1.702 m)   Wt 268 lb (121.6 kg)   SpO2 93%   BMI 41.97 kg/m   Pertinent Labs, Studies, and Procedures:   CT Brain and Cervical Spine without Contrast 12/26/2015 IMPRESSION: CT BRAIN:  1. No acute intracranial process. 2. Small scalp contusion at the right frontal vertex. 3. Mild age-related cerebral atrophy with chronic small vessel ischemic disease.  CT CERVICAL SPINE:  1. Linear lucencies traversing the posterior ring of C1 bilaterally. While these are favored to be chronic in nature, these are somewhat age indeterminate, with possible acute fractures not entirely excluded. Further evaluation with MRI is recommended to assess the chronicity of this finding. 2. No other acute traumatic injury within the cervical spine. 3. Status post ACDF at C4 through C7 without complication.  MRI  Cervical Spine 12/26/2015  IMPRESSION: 1. A definite C1 fracture is not identified on the MRI but I believe the findings on the CT scan are real and there is abnormal prevertebral fluid and hemorrhage which would suggest a fracture. 2. Central disc protrusion at C3-4 with mild mass effect on the ventral thecal sac and mild bilateral foraminal encroachment. 3. Solid fusion changes from R1-H6 without complicating features. 4. Normal appearance of the cervical spinal cord.  Radiographs of Left Shoulder, Humerus, and Elbow 12/26/2015 No fractures   Discharge Instructions: Discharge Instructions    Ambulatory referral to Physical Therapy    Complete by:  As directed    Diet - low sodium heart healthy    Complete by:  As directed    Increase activity slowly    Complete by:  As directed     You were admitted to the hospital following your fall.  We found that you have a fracture of the C1 vertebrae in your neck.  There is no need for surgery, but you must wear the collar all the time for 3 months!  This is important to  make sure the fracture heals properly and your spinal cord is not damaged.  Since you cannot turn your head it is not safe for you to drive.  I have prescribed you a few days of oxycodone for pain.  Be careful while taking this medicine, as it can make you sleepy and more likely to fall.  You can also take over-the counter Tylenol (no more than 3000 mg in a day) and Ibuprofen (800 mg three times per day) for pain.  Stop taking the mirtazapine (Remeron), as this could also make it more likely for you to fall.  Please stop taking the Invokana.  Follow-up with your PCP to continue working on your diabetes medicines.  Please call your PCP at the neurosurgeon to make follow-up appointments.  If you have numbness or weakness in your arms of legs, or difficulties urinating or defecating, please go the ED for urgent evaluation.  Signed: Minus Liberty, MD 12/28/2015, 8:58 PM   Pager: [phone]

## 2015-12-27 NOTE — Care Management Obs Status (Signed)
[facility] NOTIFICATION   Patient Details  Name: Kenneth Fuller MRN: [id number] Date of Birth: 08-Jul-1945   Medicare Observation Status Notification Given:  Yes    Dellie Catholic, RN 12/27/2015, 4:16 PM

## 2015-12-27 NOTE — Progress Notes (Signed)
Some neck pain Moving arms and legs well C-collar fitting well No new recommendations

## 2015-12-27 NOTE — Progress Notes (Signed)
Physical Therapy Evaluation Patient Details Name: Kenneth Fuller MRN: [id number] DOB: Aug 07, 1945 Today's Date: 12/27/2015   History of Present Illness  70 year old man with stable C1 fracture and rotator cuff injury following mechanical fall.  Prior history of Lumbar and Cervical spine surgeries.  Clinical Impression  Patient agreeable to therapy evaluation.  Has questions regarding long term pain management (wants to avoid opioids as much as possible).  Patient demonstrates Supervision ability for bed mobility and ambulation, pain in neck and L shoulder currently managed by medications.  Reviewed C-spine precautions (hard collar at all times), and L shoulder sling (as needed for comfort).  Patient is able to manage his mobility, is elevated fall risk at the moment, recommended assistive device use to mitigate risk.  Patient still with some outstanding questions, pain management, orthopedic care for L shoulder, and OT for dressing/bathing training, in addition to further PT for shoulder as indicated by orthopedics.  Patient would like to stay overnight again and hopefully get these issues resolved better before returning home.  Patient will remain on services acutely, may DC when medically appropriate.    Follow Up Recommendations Home health PT;Outpatient PT;Supervision - Intermittent (Home health initially, then Outpatient for shoulder)    Equipment Recommendations  None recommended by PT (Patient has equipment at home)    Recommendations for Other Services OT consult (Pain management? Patient with concerns about pain medication)     Precautions / Restrictions Precautions Precautions: Cervical;Shoulder;Fall Type of Shoulder Precautions: Subscapularis tear Shoulder Interventions: Shoulder sling/immobilizer;For comfort Required Braces or Orthoses: Cervical Brace;Sling Cervical Brace: Hard collar;At all times Restrictions Weight Bearing Restrictions: No Other Position/Activity  Restrictions: Instructed patient to let pain be guide for L shoulder ROM      Mobility  Bed Mobility Overal bed mobility: Needs Assistance Bed Mobility: Sidelying to Sit;Rolling Rolling: Modified independent (Device/Increase time) Sidelying to sit: Supervision       General bed mobility comments: Head of bed elevated, using bed rail.  Transfers Overall transfer level: Needs assistance Equipment used: None Transfers: Sit to/from Stand Sit to Stand: Supervision         General transfer comment: Support from Right arm mainly, mild unsteady on inital stand  Ambulation/Gait Ambulation/Gait assistance: Supervision;Modified independent (Device/Increase time) Ambulation Distance (Feet): 150 Feet Assistive device: None Gait Pattern/deviations: WFL(Within Functional Limits);Wide base of support     General Gait Details: Recommend assistive device (cane or walker) for safety  Stairs Stairs: Yes Stairs assistance: Min guard Stair Management: No rails;One rail Right Number of Stairs: 5 General stair comments: Patient has 2 steps to enter home, no rail, ramp entrance  Wheelchair Mobility    Modified Rankin (Stroke Patients Only)       Balance Overall balance assessment: Needs assistance   Sitting balance-Leahy Scale: Good       Standing balance-Leahy Scale: Good                               Pertinent Vitals/Pain Pain Assessment: 0-10 Pain Score: 3  Pain Location: Neck and L shoulder with movement Pain Descriptors / Indicators: Jabbing;Sharp;Sore    Home Living Family/patient expects to be discharged to:: Private residence Living Arrangements: Spouse/significant other Available Help at Discharge: Family Type of Home: House Home Access: Ramped entrance              Prior Function Level of Independence: Independent         Comments: Chronic  LBP, has cane and walker, does not use.     Hand Dominance        Extremity/Trunk  Assessment   Upper Extremity Assessment: LUE deficits/detail;Defer to OT evaluation       LUE Deficits / Details: Decreased AROM L shoulder, limited by pain to ~40 degree arc against gravity, pain with Internal rotation.   Lower Extremity Assessment: Overall WFL for tasks assessed         Communication   Communication: No difficulties  Cognition Arousal/Alertness: Awake/alert Behavior During Therapy: WFL for tasks assessed/performed Overall Cognitive Status: Within Functional Limits for tasks assessed                      General Comments      Exercises     Assessment/Plan    PT Assessment Patient needs continued PT services  PT Problem List Decreased range of motion;Decreased balance;Decreased mobility;Decreased knowledge of precautions;Obesity;Pain          PT Treatment Interventions Gait training;Functional mobility training;Stair training;Balance training;Patient/family education;Therapeutic exercise;Therapeutic activities    PT Goals (Current goals can be found in the Care Plan section)  Acute Rehab PT Goals Patient Stated Goal: To get the care he needs. PT Goal Formulation: With patient Time For Goal Achievement: 01/10/16 Potential to Achieve Goals: Good    Frequency Min 3X/week   Barriers to discharge        Co-evaluation               End of Session Equipment Utilized During Treatment: Gait belt;Cervical collar Activity Tolerance: Patient tolerated treatment well Patient left: in chair;with call bell/phone within reach;with family/visitor present Nurse Communication: Mobility status    Functional Assessment Tool Used: Clinical Judgement Functional Limitation: Mobility: Walking and moving around Mobility: Walking and Moving Around Current Status (P5361): At least 1 percent but less than 20 percent impaired, limited or restricted Mobility: Walking and Moving Around Goal Status [phone]): 0 percent impaired, limited or restricted     Time: 1250-1350 PT Time Calculation (min) (ACUTE ONLY): 60 min   Charges:   PT Evaluation $PT Eval Moderate Complexity: 1 Procedure PT Treatments $Gait Training: 8-22 mins $Therapeutic Activity: 8-22 mins   PT G Codes:   PT G-Codes **NOT FOR INPATIENT CLASS** Functional Assessment Tool Used: Clinical Judgement Functional Limitation: Mobility: Walking and moving around Mobility: Walking and Moving Around Current Status (Q0086): At least 1 percent but less than 20 percent impaired, limited or restricted Mobility: Walking and Moving Around Goal Status [phone]): 0 percent impaired, limited or restricted    Zenia Resides, Melita Villalona L 12/27/2015, 2:30 PM

## 2015-12-28 DIAGNOSIS — S46892A Other injury of other muscles, fascia and tendons at shoulder and upper arm level, left arm, initial encounter: Secondary | ICD-10-CM

## 2015-12-29 ENCOUNTER — Telehealth: Payer: Self-pay | Admitting: Family Medicine

## 2015-12-29 NOTE — Telephone Encounter (Signed)
Patient's call was returned by Mary Sella

## 2015-12-31 ENCOUNTER — Other Ambulatory Visit: Payer: Self-pay | Admitting: *Deleted

## 2015-12-31 ENCOUNTER — Other Ambulatory Visit: Payer: Self-pay | Admitting: Family Medicine

## 2015-12-31 DIAGNOSIS — E119 Type 2 diabetes mellitus without complications: Secondary | ICD-10-CM

## 2016-01-05 ENCOUNTER — Ambulatory Visit (INDEPENDENT_AMBULATORY_CARE_PROVIDER_SITE_OTHER): Payer: PPO | Admitting: Family Medicine

## 2016-01-05 ENCOUNTER — Encounter: Payer: Self-pay | Admitting: Family Medicine

## 2016-01-05 VITALS — BP 135/84 | HR 78 | Temp 97.1°F | Ht 67.0 in | Wt 270.0 lb

## 2016-01-05 DIAGNOSIS — S12001A Unspecified nondisplaced fracture of first cervical vertebra, initial encounter for closed fracture: Secondary | ICD-10-CM

## 2016-01-05 DIAGNOSIS — S46892D Other injury of other muscles, fascia and tendons at shoulder and upper arm level, left arm, subsequent encounter: Secondary | ICD-10-CM

## 2016-01-05 DIAGNOSIS — S46892A Other injury of other muscles, fascia and tendons at shoulder and upper arm level, left arm, initial encounter: Secondary | ICD-10-CM

## 2016-01-05 DIAGNOSIS — S12001D Unspecified nondisplaced fracture of first cervical vertebra, subsequent encounter for fracture with routine healing: Secondary | ICD-10-CM

## 2016-01-05 MED ORDER — TRAMADOL HCL 50 MG PO TABS: 50.0000 mg | ORAL_TABLET | Freq: Three times a day (TID) | ORAL | 0 refills | Status: DC | PRN

## 2016-01-05 MED ORDER — TIZANIDINE HCL 2 MG PO CAPS: 2.0000 mg | ORAL_CAPSULE | Freq: Three times a day (TID) | ORAL | 1 refills | Status: DC

## 2016-01-05 MED ORDER — METHYLPREDNISOLONE ACETATE 80 MG/ML IJ SUSP: 80.0000 mg | Freq: Once | INTRAMUSCULAR | Status: DC

## 2016-01-05 NOTE — Progress Notes (Signed)
BP 135/84   Pulse 78   Temp 97.1 F (36.2 C) (Oral)   Ht _0  (1.702 m)   Wt 270 lb (122.5 kg)   BMI 42.29 kg/m    Subjective:    Patient ID: Kenneth Fuller, male    DOB: Feb 26, 1945, 70 y.o.   MRN: [id number]  HPI: Kenneth Fuller is a 70 y.o. male presenting on 01/05/2016 for Hospital followup (having a lof of pain in both arms and neck)   HPI Hospital follow-up for a fall that resulted in C1 vertebral fracture and left rotator cuff tear Patient is coming in for hospital follow-up. He was in the hospital on 12/26/2015 for a fall where he hit his head and his shoulder on the left side on the edge of his bathtub. This occurred when he was trying to get up to use the restroom around 2 AM and trips in the dark and fell on the bathtub. He was found to have a C1 vertebral fracture and a partial left rotator cuff tear. He was seen by orthopedic and placed in a c-collar. He was also given some pain medications and told to follow-up if he wanted an injection in his shoulder. He is coming here today because he is still having significant pain and tightness in the musculature of his neck and the back of his head. He is also having significant pain in his left shoulder and especially pain with overhead range of motion. No orthopedic told him that he does not want them in a sling very much wants him moving his arm around. He denies any fevers or chills or numbness or weakness in either arm or leg. He denies any difficulties breathing. He is having significant difficulty sleeping because of the pain in the c-collar. He would like a referral to an orthopedic to follow-up and see if there is a different gynecology can wear. The pain is an 8 out of 10. He was given oxycodone 5 mg in the hospital which helped a little but not much with the pain but caused him to have significant discomfort in that he had a lot of itching all over his entire body. He did not want to take anything that strong anymore and  is wondering if there is something that could help him.  Relevant past medical, surgical, family and social history reviewed and updated as indicated. Interim medical history since our last visit reviewed. Allergies and medications reviewed and updated.  Review of Systems  Constitutional: Negative for chills and fever.  Respiratory: Negative for shortness of breath and wheezing.   Cardiovascular: Negative for chest pain and leg swelling.  Musculoskeletal: Positive for arthralgias, back pain, myalgias and neck pain. Negative for gait problem.  Skin: Negative for color change and rash.  All other systems reviewed and are negative.   Per HPI unless specifically indicated above      Objective:    BP 135/84   Pulse 78   Temp 97.1 F (36.2 C) (Oral)   Ht _1  (1.702 m)   Wt 270 lb (122.5 kg)   BMI 42.29 kg/m   Wt Readings from Last 3 Encounters:  01/05/16 270 lb (122.5 kg)  12/26/15 268 lb (121.6 kg)  12/23/15 281 lb 2 oz (127.5 kg)    Physical Exam  Constitutional: He is oriented to person, place, and time. He appears well-developed and well-nourished. No distress.  Eyes: Conjunctivae are normal. Right eye exhibits no discharge. Left eye exhibits no discharge.  No scleral icterus.  Cardiovascular: Normal rate, regular rhythm, normal heart sounds and intact distal pulses.   No murmur heard. Pulmonary/Chest: Effort normal and breath sounds normal. No respiratory distress. He has no wheezes. He has no rales.  Musculoskeletal: Normal range of motion. He exhibits no edema.       Left shoulder: He exhibits tenderness (Tenderness and swelling in the shoulder and extending down the upper arm.) and swelling. He exhibits normal range of motion, no deformity and normal pulse.       Cervical back: He exhibits tenderness (Patient in c-collar).  Neurological: He is alert and oriented to person, place, and time. Coordination normal.  Skin: Skin is warm and dry. No rash noted. He is not  diaphoretic.  Psychiatric: He has a normal mood and affect. His behavior is normal.  Nursing note and vitals reviewed.   Shoulder injection: Risk factors of bleeding and infection discussed with patient and patient is agreeable towards injection. Patient prepped with Betadine. Posterior approach towards injection used. Injected 80 mg of Depo-Medrol and 1 mL of 2% lidocaine. Patient tolerated procedure well and no side effects from noted. Minimal to no bleeding. Simple bandage applied after.     Assessment & Plan:   Problem List Items Addressed This Visit      Musculoskeletal and Integument   C1 cervical fracture ([place]) - Primary   Relevant Medications   traMADol (ULTRAM) 50 MG tablet   tizanidine (ZANAFLEX) 2 MG capsule   Other Relevant Orders   Ambulatory referral to Orthopedic Surgery     Other   Avulsion of left subscapularis   Relevant Medications   methylPREDNISolone acetate (DEPO-MEDROL) injection 80 mg (Start on 01/05/2016  2:15 PM)   traMADol (ULTRAM) 50 MG tablet   tizanidine (ZANAFLEX) 2 MG capsule   Other Relevant Orders   Ambulatory referral to Orthopedic Surgery       Follow up plan: Return if symptoms worsen or fail to improve.  Counseling provided for all of the vaccine components Orders Placed This Encounter  Procedures  . Ambulatory referral to [place] Rickeya Manus, MD [facility] 01/05/2016, 2:09 PM

## 2016-01-07 DIAGNOSIS — S12031A Nondisplaced posterior arch fracture of first cervical vertebra, initial encounter for closed fracture: Secondary | ICD-10-CM | POA: Diagnosis not present

## 2016-01-08 ENCOUNTER — Telehealth: Payer: Self-pay | Admitting: *Deleted

## 2016-01-08 NOTE — Telephone Encounter (Signed)
Called and spoke with patient to inform him that his sleep study appointment on December the 15th will be cancelled due to him not being set up on therapy yet. Per [facility] they have been waiting for his insurance approval and it was just received today. Patient informed me that 2 weeks ago he fell and fractured C-1 vertebrae in his neck. He will be wearing a neck brace for the next 3 months. Dr Claiborne Billings will be notified and further recommendation will follow. I did recommend that he call his orthopedic doctor and advise him that he will be started on CPAP therapy to see if he can make some adjustments to his brace. Also work with the respiratory therapist on what type of mask that may work with the collar.

## 2016-01-12 NOTE — Telephone Encounter (Signed)
Follow up      Pt is calling because he still has not had his CPAP therapy.  He has not heard from the home health people.  Please call

## 2016-01-15 DIAGNOSIS — G4733 Obstructive sleep apnea (adult) (pediatric): Secondary | ICD-10-CM | POA: Diagnosis not present

## 2016-01-23 ENCOUNTER — Ambulatory Visit: Payer: PPO | Admitting: Cardiovascular Disease

## 2016-01-23 ENCOUNTER — Other Ambulatory Visit: Payer: Self-pay | Admitting: Orthopedic Surgery

## 2016-01-23 DIAGNOSIS — S12031S Nondisplaced posterior arch fracture of first cervical vertebra, sequela: Secondary | ICD-10-CM | POA: Diagnosis not present

## 2016-01-26 ENCOUNTER — Telehealth: Payer: Self-pay | Admitting: Family Medicine

## 2016-01-26 NOTE — Telephone Encounter (Signed)
I don't know what type of physician Dr. Emeterio Reeve is, he does have a referral to urologist that has a name close to that, I don't know if that is what he is talking about. Please look into his referral that his urology and let him know or me know if there is anything different that I may have forgotten or missed.

## 2016-01-26 NOTE — Telephone Encounter (Signed)
Pt does not have a referral to Dr Jess Barters

## 2016-01-27 NOTE — Telephone Encounter (Signed)
Spoke with patient and gave him address for his appointment on 12/22 with [facility], urologist.

## 2016-01-29 ENCOUNTER — Other Ambulatory Visit: Payer: Self-pay | Admitting: Family Medicine

## 2016-01-30 DIAGNOSIS — R350 Frequency of micturition: Secondary | ICD-10-CM | POA: Diagnosis not present

## 2016-01-30 DIAGNOSIS — R3912 Poor urinary stream: Secondary | ICD-10-CM | POA: Diagnosis not present

## 2016-02-04 ENCOUNTER — Telehealth: Payer: Self-pay | Admitting: Family Medicine

## 2016-02-04 ENCOUNTER — Other Ambulatory Visit: Payer: Self-pay | Admitting: Family Medicine

## 2016-02-05 MED ORDER — DAPAGLIFLOZIN PROPANEDIOL 5 MG PO TABS: 5.0000 mg | ORAL_TABLET | Freq: Every day | ORAL | 2 refills | Status: DC

## 2016-02-05 NOTE — Telephone Encounter (Signed)
Pt notified of RX Verbalizes understanding

## 2016-02-06 ENCOUNTER — Ambulatory Visit
Admission: RE | Admit: 2016-02-06 | Discharge: 2016-02-06 | Disposition: A | Discharge status: Home / Self Care | Payer: PPO | Source: Ambulatory Visit | Attending: Orthopedic Surgery | Admitting: Orthopedic Surgery

## 2016-02-06 DIAGNOSIS — M542 Cervicalgia: Secondary | ICD-10-CM | POA: Diagnosis not present

## 2016-02-06 DIAGNOSIS — S12031S Nondisplaced posterior arch fracture of first cervical vertebra, sequela: Secondary | ICD-10-CM

## 2016-02-06 DIAGNOSIS — S199XXA Unspecified injury of neck, initial encounter: Secondary | ICD-10-CM | POA: Diagnosis not present

## 2016-02-11 NOTE — Telephone Encounter (Signed)
Spoke with [facility]. Patient was set up on CPAP therapy December 6th.

## 2016-02-12 ENCOUNTER — Other Ambulatory Visit: Payer: Self-pay | Admitting: Family Medicine

## 2016-02-12 DIAGNOSIS — S12001D Unspecified nondisplaced fracture of first cervical vertebra, subsequent encounter for fracture with routine healing: Secondary | ICD-10-CM

## 2016-02-12 DIAGNOSIS — S46892D Other injury of other muscles, fascia and tendons at shoulder and upper arm level, left arm, subsequent encounter: Secondary | ICD-10-CM

## 2016-02-12 NOTE — Telephone Encounter (Signed)
ok 

## 2016-02-15 DIAGNOSIS — G4733 Obstructive sleep apnea (adult) (pediatric): Secondary | ICD-10-CM | POA: Diagnosis not present

## 2016-02-17 DIAGNOSIS — S12031S Nondisplaced posterior arch fracture of first cervical vertebra, sequela: Secondary | ICD-10-CM | POA: Diagnosis not present

## 2016-02-18 ENCOUNTER — Telehealth: Payer: Self-pay | Admitting: Cardiovascular Disease

## 2016-02-18 DIAGNOSIS — S12031S Nondisplaced posterior arch fracture of first cervical vertebra, sequela: Secondary | ICD-10-CM | POA: Diagnosis not present

## 2016-02-18 NOTE — Telephone Encounter (Signed)
Dr Rolena Infante discussed patient's CPAP mask usage type with Dr Claiborne Billings and myself.  Dr Rolena Infante will Just have the patient sleep in a recliner. He sees no other way for the patient to use his CPAP machine with wearing a neck brace.

## 2016-02-19 ENCOUNTER — Telehealth: Payer: Self-pay | Admitting: Family Medicine

## 2016-02-19 ENCOUNTER — Encounter: Payer: Self-pay | Admitting: Neurology

## 2016-02-19 ENCOUNTER — Ambulatory Visit (INDEPENDENT_AMBULATORY_CARE_PROVIDER_SITE_OTHER): Payer: PPO | Admitting: Neurology

## 2016-02-19 VITALS — BP 140/86 | HR 90 | Resp 20 | Ht 67.0 in | Wt 275.0 lb

## 2016-02-19 DIAGNOSIS — S12001S Unspecified nondisplaced fracture of first cervical vertebra, sequela: Secondary | ICD-10-CM

## 2016-02-19 DIAGNOSIS — R2689 Other abnormalities of gait and mobility: Secondary | ICD-10-CM

## 2016-02-19 DIAGNOSIS — S060X9S Concussion with loss of consciousness of unspecified duration, sequela: Secondary | ICD-10-CM

## 2016-02-19 DIAGNOSIS — R51 Headache: Secondary | ICD-10-CM

## 2016-02-19 DIAGNOSIS — R519 Headache, unspecified: Secondary | ICD-10-CM

## 2016-02-19 NOTE — Telephone Encounter (Signed)
The pt says it is going to be $150 for 3 months, is there anything else that he can use?

## 2016-02-19 NOTE — Progress Notes (Signed)
Subjective:    Patient ID: Kenneth Fuller is a 71 y.o. male.  HPI    Star Age, MD, PhD [facility] [address], [address]O. [place], [place] [address]  Dear Dr. Rolena Infante,   I saw your patient, Kenneth Fuller, upon your kind request in my neurologic clinic today for initial consultation of his balance problem. The patient is accompanied by his wife today. As you know, Kenneth Fuller is a 71 year old right-handed gentleman with an underlying medical history of morbid obesity, neck surgery, low back surgery, GERD, OSA on CPAP, HTN, neck injury secondary to fall in November 2017, during which he sustained a C1 fracture, and left rotator cuff injury, who reports residual headaches and balace issues, post head injury on 12/25/16. He had LOC for some time and was calling out for his wife, but she could not hear him. He had gotten up to use the bathroom around 3 AM. He had not made enough light, she reports, and he did not like to turn on lights at night, but has been using a night light since the accidental fall. He has a cane and a walker from before, from when he had low back surgery.  I reviewed your office note from 02/18/15, which you kindly included. He was treated for his C1 fracture conservatively with a collar. He was admitted on 12/26/2015 after a fall in the bathtub at home. He was discharged on 12/27/2015. He had a head CT without contrast as well as a C-spine CT without contrast on 12/26/2015 which I reviewed: IMPRESSION: CT BRAIN:   1. No acute intracranial process. 2. Small scalp contusion at the right frontal vertex. 3. Mild age-related cerebral atrophy with chronic small vessel ischemic disease.   CT CERVICAL SPINE:   1. Linear lucencies traversing the posterior ring of C1 bilaterally. While these are favored to be chronic in nature, these are somewhat age indeterminate, with possible acute fractures not entirely excluded. Further evaluation  with MRI is recommended to assess the chronicity of this finding. 2. No other acute traumatic injury within the cervical spine. 3. Status post ACDF at C4 through C7 without complication. He had a cervical spine MRI without contrast on 12/26/2015 which I reviewed: IMPRESSION: 1. A definite C1 fracture is not identified on the MRI but I believe the findings on the CT scan are real and there is abnormal prevertebral fluid and hemorrhage which would suggest a fracture. 2. Central disc protrusion at C3-4 with mild mass effect on the ventral thecal sac and mild bilateral foraminal encroachment. 3. Solid fusion changes from I6-E7 without complicating features. 4. Normal appearance of the cervical spinal cord. He had a repeat CT cervical spine without contrast on 02/06/2016 which I reviewed: IMPRESSION: 1. Linear lucency through the inferior margin of the right and left posterior arches of C1 which are unchanged compared with 12/26/2015 and may reflect incomplete ununited nondisplaced fractures versus vascular foramina. No significant interval change compared with 12/26/2015. 2. Anterior cervical disc fusion from C4 through C7.  As I understand, he will be using his hard neck collar for at least 6 more weeks. He has not been driving and has been advised not to drive secondary to his neck fracture and using a hard collar. He has not had any episodes of confusion or convulsions. He does not have any one-sided weakness or numbness. He does have some tingling in the right shoulder area and proximal right arm area. When he fell he initially  was not able to move either arm or leg he remembers. He has a history of obstructive sleep apnea and has been trying to use his CPAP. He tries to sleep in the recliner for more comfort and ease of use with his CPAP. It is difficult for him to use his CPAP currently. He has intermittent right parietal headaches which are short-lived, sometimes just a few minutes, as long  as 5 or 10 minutes at most. They are moderately severe but not long enough for him to take any medication. Of note, he is not on any narcotic pain medication and takes as needed tramadol which he has been taking sparingly. He has not had any visual symptoms or speech impairment, memory seems stable and adequate.   His Past Medical History Is Significant For: Past Medical History:  Diagnosis Date  . Allergy   . CAD (coronary artery disease)   . Diabetes mellitus   . Fibromyalgia   . GERD (gastroesophageal reflux disease)   . GI bleeding   . Gout   . Hyperlipidemia   . Hypertension   . Hypogonadism male   . MRSA cellulitis   . Neuropathy ([place])   . Obesity   . Shortness of breath dyspnea    with exertion   . Sleep apnea    cpap- 14   . Wheezing    no asthma diagnosis    His Past Surgical History Is Significant For: Past Surgical History:  Procedure Laterality Date  . BACK SURGERY    . CARDIAC CATHETERIZATION    . COLONOSCOPY    . LUMBAR LAMINECTOMY/DECOMPRESSION MICRODISCECTOMY N/A 01/02/2014   Procedure: CENTRAL DECOMPRESSION LUMBAR LAMINECTOMY L3-L4, L4-L5;  Surgeon: Tobi Bastos, MD;  Location: [facility];  Service: Orthopedics;  Laterality: N/A;  . neck fusion      His Family History Is Significant For: Family History  Problem Relation Age of Onset  . Colon cancer Mother   . Diabetes Father     siblings  . Heart disease Father     brother  . Kidney disease Sister   . Colon polyps Neg Hx     His Social History Is Significant For: Social History   Social History  . Marital status: Married    Spouse name: N/A  . Number of children: 3  . Years of education: N/A   Occupational History  . retired Disabled   Social History Main Topics  . Smoking status: Former Smoker    Quit date: 02/08/1969  . Smokeless tobacco: Former Systems developer  . Alcohol use No  . Drug use: No  . Sexual activity: Not Asked   Other Topics Concern  . None   Social History Narrative    Drinks caffeine tea occasionally     His Allergies Are:  Allergies  Allergen Reactions  . Amlodipine Besy-Benazepril Hcl     Makes tongue swell  . Phenergan [Promethazine Hcl]     "I can't remember."  . Hydrocodone Itching    Can tolerate in low doses  :   His Current Medications Are:  Outpatient Encounter Prescriptions as of 02/19/2016  Medication Sig  . albuterol (PROVENTIL HFA;VENTOLIN HFA) 108 (90 Base) MCG/ACT inhaler Inhale 2 puffs into the lungs every 6 (six) hours as needed for wheezing or shortness of breath.  [name] amLODipine (NORVASC) 10 MG tablet TAKE 1/2 (5MG) BY MOUTH DAILY  . atorvastatin (LIPITOR) 40 MG tablet TAKE ONE TABLET BY MOUTH ONCE DAILY  . Blood Glucose Monitoring Suppl (PRODIGY BLOOD  GLUCOSE MONITOR) W/DEVICE KIT 1 Units by Does not apply route daily. Check blood sugars 1X per day-- dx 250.02  . Cholecalciferol (VITAMIN D PO) Take 1 tablet by mouth every morning.  . dapagliflozin propanediol (FARXIGA) 5 MG TABS tablet Take 5 mg by mouth daily.  . fluconazole (DIFLUCAN) 150 MG tablet Take 1 tablet (150 mg total) by mouth once a week.  . fluticasone (FLONASE) 50 MCG/ACT nasal spray Place 2 sprays into both nostrils daily.  . Fluticasone-Salmeterol (ADVAIR) 100-50 MCG/DOSE AEPB Inhale 1 puff into the lungs 2 (two) times daily.  . furosemide (LASIX) 40 MG tablet TAKE 1 BY MOUTH TWICE DAILY (Patient taking differently: Take 40 mg by mouth daily. )  . glucose blood test strip Test 1X per day and prn- Dx Code E11.9. One Touch Ultra Test Strips  . KLOR-CON M10 10 MEQ tablet TAKE ONE TABLET BY MOUTH ONCE DAILY  . metFORMIN (GLUCOPHAGE) 1000 MG tablet TAKE ONE TABLET BY MOUTH TWICE DAILY WITH A MEAL  . metoprolol (LOPRESSOR) 50 MG tablet Take 50 mg by mouth 2 (two) times daily.  [name] omeprazole (PRILOSEC) 40 MG capsule TAKE ONE CAPSULE BY MOUTH ONCE DAILY  . ONETOUCH DELICA LANCETS 69G MISC 1 each by Does not apply route daily. Test 1X per day and prn - DX 250.02  .  tizanidine (ZANAFLEX) 2 MG capsule Take 1 capsule (2 mg total) by mouth 3 (three) times daily.  . traMADol (ULTRAM) 50 MG tablet Take 1 tablet (50 mg total) by mouth every 8 (eight) hours as needed.   Facility-Administered Encounter Medications as of 02/19/2016  Medication  . methylPREDNISolone acetate (DEPO-MEDROL) injection 80 mg  :   Review of Systems:  Out of a complete 14 point review of systems, all are reviewed and negative with the exception of these symptoms as listed below:  Review of Systems  Neurological:       Patient had a head injury about 8 weeks ago from a fall into a bath tub. He was told that he has a C1 fracture and rotator cuff injury.  He now has pain in shoulders, arms, hand, unsteady balance, light headedness, and sharp pains in his head.     Objective:  Neurologic Exam  Physical Exam Physical Examination:   Vitals:   02/19/16 1023  BP: 140/86  Pulse: 90  Resp: 20   General Examination: The patient is a very pleasant 71 y.o. male in no acute distress. He appears well-developed and well-nourished and well groomed.   HEENT: Normocephalic, atraumatic, pupils are equal, round and reactive to light and accommodation. Funduscopic exam is normal with sharp disc margins noted. Extraocular tracking is good without limitation to gaze excursion or nystagmus noted. Normal smooth pursuit is noted. Hearing is grossly intact. Face is symmetric with normal facial animation and normal facial sensation. Speech is clear with no dysarthria noted. There is no hypophonia. There is no lip, neck/head, jaw or voice tremor. Neck is in a hard collar, no passive movements done and limited range of active motion. Oropharynx exam reveals: mild mouth dryness, adequate dental hygiene. Tongue protrudes centrally and palate elevates symmetrically.   Chest: Clear to auscultation without wheezing, rhonchi or crackles noted.  Heart: S1+S2+0, regular and normal without murmurs, rubs or gallops  noted.   Abdomen: Soft, non-tender and non-distended with normal bowel sounds appreciated on auscultation.  Extremities: There is some puffiness in the distal lower extremities bilaterally.  Skin: Warm and dry without trophic changes noted.  Musculoskeletal:  exam reveals no obvious joint deformities, tenderness or joint swelling or erythema.   Neurologically:  Mental status: The patient is awake, alert and oriented in all 4 spheres. His immediate and remote memory, attention, language skills and fund of knowledge are appropriate. There is no evidence of aphasia, agnosia, apraxia or anomia. Speech is clear with normal prosody and enunciation. Thought process is linear. Mood is normal and affect is normal.  Cranial nerves II - XII are as described above under HEENT exam. In addition: shoulder shrug is normal with equal shoulder height noted. Motor exam: Normal bulk, strength and tone is noted. There is no drift, tremor or rebound. Romberg is not tested for safety reasons. Reflexes are 1+ throughout. Babinski: Toes are flexor bilaterally. Fine motor skills and coordination: intact with normal finger taps, normal hand movements, normal rapid alternating patting, normal foot taps and normal foot agility.  Cerebellar testing: No dysmetria or intention tremor on finger to nose testing. Heel to shin is unremarkable bilaterally. There is no truncal or gait ataxia.  Sensory exam: intact to light touch, pinprick, vibration, temperature sense in the upper and lower extremities.  Gait, station and balance: He stands with mild difficulty. No veering to one side is noted. No leaning to one side is noted. Posture is age-appropriate and stance is fairly narrow based. Gait shows cautious gate, no cane, no walker brought in today.                Assessment and Plan:    In summary, ARSHAN JABS is a very pleasant 71 y.o.-year old male morbid obesity, neck surgery, low back surgery, GERD, OSA on CPAP, HTN, neck  injury secondary to fall in November 2017, during which she sustained a C1 fracture, who presents for initial consultation for his balance issues and recurrent right-sided headaches. His history suggests concussion with loss of consciousness of unclear or unknown duration. I had a long discussion with the patient and his wife regarding concussion and that it can take months to feel better after a head injury. He had initial head CT which was negative for any acute findings. Nevertheless, because of the recurrence of his brief right-sided headaches, I would like to proceed with a brain MRI without contrast and also proceed with an EEG. Neurologically, he has a nonfocal exam and of course is still in a hard neck collar which he is to continue for at least 6 weeks as I understand. He is advised to change positions slowly, stay well hydrated and well rested and be compliant with his CPAP therapy. He is furthermore advised to avoid any sedating medications including over-the-counter sleep aids. He was at the time of his fall on Remeron as I understand which was since then discontinued. He is advised to continue to stay off of Remeron. We will call him with his MRI and EEG results. Thankfully, there is nothing on exam that suggests any structural lesion in the brain. Symptomatically, he has been using tramadol for pain as needed and sparingly. He can certainly continue to do that and also utilize Tylenol and over-the-counter ibuprofen as needed. Should his headache persist beyond 3 months or longer, we can certainly consider prophylactic headache medication. We will pick up our discussion about headache prevention next time. We talked about fall prevention quite a bit today as well. He is advised to make enough light at night if he has to get up to use the bathroom, furthermore, he is advised to use his walker at  night for gait safety. I will see him back routinely in 3 months, sooner as needed. I answered all their  questions today the patient and his wife were in agreement.  Thank you very much for allowing me to participate in the care of this nice patient. If I can be of any further assistance to you please do not hesitate to call me at [phone].  Sincerely,   Star Age, MD, PhD

## 2016-02-19 NOTE — Patient Instructions (Addendum)
Please refrain from taking any sleep aids, including mirtazapine.  Please refrain from taking any sedating medications, as they can affect your balance.  Post-concussion syndrome is a complex disorder which presents with a variety of symptoms, including headache, dizziness, cognitive issues, balance issues, mood related symptoms, visual disturbances, ringing in the ears, sleeplessness or sleepiness, and motor issues. These can last for weeks and sometimes months after the initial head injury. Concussion is a mild form of traumatic brain injury, and usually occurs after a blow to the head. Loss of consciousness is not a requirement for the diagnosis of concussion or of postconcussion syndrome. The risk of post concussive symptoms does not appear to be correlated with the severity of the initial injury. Most people who have a concussion are without any residual symptoms. Some patients have symptoms that occur within the first 7-10 days of the initial injury and had complete resolution of her symptoms within 3 months. However, keep in mind that postconcussive symptoms can persist for a year and sometimes even longer. There is no specific test for the diagnosis of concussion. There is no specific treatment for concussion or postconcussive symptoms and care is supportive. Treatment is geared towards improving symptoms. Medications commonly used for migraines or tension headaches, including some antidepressants, appear to be helpful with postconcussive headaches. Medications include amitriptyline, Topamax, or gabapentin and others. Keep in mind that overuse of over-the-counter pain medications can exacerbate post concussion headaches.   There are no medications currently recommended specifically for cognitive complaints after mild traumatic brain injury. Usually, time is the best treatment for postconcussive cognitive issues as most of the cognitive complaints resolve on their own in the first few weeks or months  after the injury. Sometimes a referral to a psychiatrist or psychologist is helpful. Certain forms of cognitive therapy may be helpful, and relaxation therapy may also help.  Use your walker if you have to get up at night.   Stay well hydrated and change positions slowly.

## 2016-02-22 NOTE — Telephone Encounter (Signed)
Go ahead and give him either samples for farxiga or jardiance

## 2016-02-23 MED ORDER — CANAGLIFLOZIN 300 MG PO TABS: 300.0000 mg | ORAL_TABLET | Freq: Every day | ORAL | 1 refills | Status: DC

## 2016-02-23 NOTE — Telephone Encounter (Signed)
Aware, samples ready for Jardiance 10 mg .  He says Vania Rea, (tried already), is also very expensive so he will take the samples until provider can send in a cheaper script.

## 2016-02-23 NOTE — Telephone Encounter (Signed)
Pt aware

## 2016-02-24 ENCOUNTER — Ambulatory Visit
Admission: RE | Admit: 2016-02-24 | Discharge: 2016-02-24 | Disposition: A | Discharge status: Home / Self Care | Payer: PPO | Source: Ambulatory Visit | Attending: Neurology | Admitting: Neurology

## 2016-02-24 DIAGNOSIS — R2689 Other abnormalities of gait and mobility: Secondary | ICD-10-CM

## 2016-02-24 DIAGNOSIS — S12001S Unspecified nondisplaced fracture of first cervical vertebra, sequela: Secondary | ICD-10-CM

## 2016-02-24 DIAGNOSIS — R269 Unspecified abnormalities of gait and mobility: Secondary | ICD-10-CM | POA: Diagnosis not present

## 2016-02-24 DIAGNOSIS — S060X9S Concussion with loss of consciousness of unspecified duration, sequela: Secondary | ICD-10-CM

## 2016-02-24 DIAGNOSIS — R519 Headache, unspecified: Secondary | ICD-10-CM

## 2016-02-24 DIAGNOSIS — R51 Headache: Secondary | ICD-10-CM

## 2016-02-27 NOTE — Progress Notes (Signed)
Please call patient regarding the recent brain MRI: The brain scan showed a normal structure of the brain and mild volume loss which we call atrophy. There were changes in the deeper structures of the brain, which we call white matter changes or microvascular changes. These were reported as mild in His case. These are tiny white spots, that occur with time and are seen in a variety of conditions, including with normal aging, chronic hypertension, chronic headaches, especially migraine HAs, chronic diabetes, chronic hyperlipidemia. These are not strokes and no mass or lesion were seen which is reassuring. Again, there were no acute findings, such as a stroke, or mass or blood products. All in all, fairly age-appropriate findings.  No further action is required on this test at this time, other than re-enforcing the importance of good blood pressure control, good cholesterol control, good blood sugar control, and weight management. Please remind patient to keep any upcoming appointments or tests and to call us with any interim questions, concerns, problems or updates.   As far as his cervical fracture and spinal cord of there cervical spine (the parts that were visible on the brain MRI): at C3-4 there was a disc protrusion and spinal cord compression as well as surgical changes from his fusion at the C4 level. For all this, he is advised to follow up with his neck surgeon.   Thanks,  Star Age, MD, PhD

## 2016-03-01 ENCOUNTER — Telehealth: Payer: Self-pay | Admitting: Cardiovascular Disease

## 2016-03-01 NOTE — Telephone Encounter (Signed)
New Message    Pt wife wants to know if you have chin straps for when Jacinto is sleeping it keeps mouth closed

## 2016-03-01 NOTE — Telephone Encounter (Signed)
Spoke to wife, she's requesting chin straps for pt CPAP and wanted to know if we have these. I've informed her we do not keep this type of equipment on hand. advised to contact DME supplier for this request. She voiced understanding and thanks.

## 2016-03-02 ENCOUNTER — Ambulatory Visit: Payer: PPO | Admitting: Cardiovascular Disease

## 2016-03-03 ENCOUNTER — Telehealth: Payer: Self-pay

## 2016-03-03 NOTE — Telephone Encounter (Signed)
I spoke to patient and he is aware of results and recommendations. I have faxed results to Dr. Rolena Infante' office.

## 2016-03-03 NOTE — Telephone Encounter (Signed)
-----  Message from Star Age, MD sent at 02/27/2016  2:45 PM EST ----- Please call patient regarding the recent brain MRI: The brain scan showed a normal structure of the brain and mild volume loss which we call atrophy. There were changes in the deeper structures of the brain, which we call white matter changes or microvascular changes. These were reported as mild in His case. These are tiny white spots, that occur with time and are seen in a variety of conditions, including with normal aging, chronic hypertension, chronic headaches, especially migraine HAs, chronic diabetes, chronic hyperlipidemia. These are not strokes and no mass or lesion were seen which is reassuring. Again, there were no acute findings, such as a stroke, or mass or blood products. All in all, fairly age-appropriate findings.  No further action is required on this test at this time, other than re-enforcing the importance of good blood pressure control, good cholesterol control, good blood sugar control, and weight management. Please remind patient to keep any upcoming appointments or tests and to call us with any interim questions, concerns, problems or updates.   As far as his cervical fracture and spinal cord of there cervical spine (the parts that were visible on the brain MRI): at C3-4 there was a disc protrusion and spinal cord compression as well as surgical changes from his fusion at the C4 level. For all this, he is advised to follow up with his neck surgeon.   Thanks,  Star Age, MD, PhD

## 2016-03-04 ENCOUNTER — Ambulatory Visit (INDEPENDENT_AMBULATORY_CARE_PROVIDER_SITE_OTHER): Payer: PPO | Admitting: Cardiovascular Disease

## 2016-03-04 ENCOUNTER — Encounter: Payer: Self-pay | Admitting: Cardiovascular Disease

## 2016-03-04 VITALS — BP 134/77 | HR 88 | Ht 67.0 in | Wt 278.4 lb

## 2016-03-04 DIAGNOSIS — E662 Morbid (severe) obesity with alveolar hypoventilation: Secondary | ICD-10-CM

## 2016-03-04 DIAGNOSIS — G4733 Obstructive sleep apnea (adult) (pediatric): Secondary | ICD-10-CM

## 2016-03-04 DIAGNOSIS — E785 Hyperlipidemia, unspecified: Secondary | ICD-10-CM

## 2016-03-04 DIAGNOSIS — E119 Type 2 diabetes mellitus without complications: Secondary | ICD-10-CM

## 2016-03-04 DIAGNOSIS — I10 Essential (primary) hypertension: Secondary | ICD-10-CM | POA: Diagnosis not present

## 2016-03-04 NOTE — Patient Instructions (Signed)
Your physician wants you to follow-up in: 1 year or sooner if needed. You will receive a reminder letter in the mail two months in advance. If you don't receive a letter, please call our office to schedule the follow-up appointment.
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
## 2016-03-17 ENCOUNTER — Ambulatory Visit (INDEPENDENT_AMBULATORY_CARE_PROVIDER_SITE_OTHER): Payer: PPO | Admitting: Neurology

## 2016-03-17 DIAGNOSIS — S06891S Other specified intracranial injury with loss of consciousness of 30 minutes or less, sequela: Secondary | ICD-10-CM

## 2016-03-17 DIAGNOSIS — R2689 Other abnormalities of gait and mobility: Secondary | ICD-10-CM

## 2016-03-17 DIAGNOSIS — R519 Headache, unspecified: Secondary | ICD-10-CM

## 2016-03-17 DIAGNOSIS — R51 Headache: Secondary | ICD-10-CM

## 2016-03-17 DIAGNOSIS — S12001S Unspecified nondisplaced fracture of first cervical vertebra, sequela: Secondary | ICD-10-CM

## 2016-03-17 DIAGNOSIS — S060X9S Concussion with loss of consciousness of unspecified duration, sequela: Secondary | ICD-10-CM

## 2016-03-17 DIAGNOSIS — G4733 Obstructive sleep apnea (adult) (pediatric): Secondary | ICD-10-CM | POA: Diagnosis not present

## 2016-03-17 NOTE — Progress Notes (Signed)
Please call and advise the patient that the EEG or brain wave test we performed was reported as normal in the awake state. We checked for abnormal electrical discharges in the brain waves and the report suggested normal findings. No further action is required on this test at this time. Please remind patient to keep any upcoming appointments or tests and to call us with any interim questions, concerns, problems or updates. Thanks,  Star Age, MD, PhD
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
## 2016-03-18 ENCOUNTER — Other Ambulatory Visit: Payer: Self-pay | Admitting: Family Medicine

## 2016-03-18 ENCOUNTER — Telehealth: Payer: Self-pay | Admitting: *Deleted

## 2016-03-18 NOTE — Telephone Encounter (Signed)
Per Dr Rexene Alberts, called patient and left detailed VM informing him his EEG results showed that the EEG or brain wave test we performed was reported as normal in the awake state. We checked for abnormal electrical discharges in the brain waves and the report suggested normal findings. No further action is required on this test at this time. Reminded patient to keep his upcoming appointments in April and to call with any interim questions, concerns, problems or updates. Left office number.

## 2016-03-19 ENCOUNTER — Telehealth: Payer: Self-pay | Admitting: Family Medicine

## 2016-03-19 MED ORDER — EMPAGLIFLOZIN 10 MG PO TABS: 10.0000 mg | ORAL_TABLET | Freq: Every day | ORAL | 0 refills | Status: DC

## 2016-03-19 NOTE — Telephone Encounter (Signed)
Pt aware samples at front desk

## 2016-03-23 DIAGNOSIS — S12031S Nondisplaced posterior arch fracture of first cervical vertebra, sequela: Secondary | ICD-10-CM | POA: Diagnosis not present

## 2016-03-25 ENCOUNTER — Ambulatory Visit (INDEPENDENT_AMBULATORY_CARE_PROVIDER_SITE_OTHER): Payer: PPO | Admitting: Family Medicine

## 2016-03-25 ENCOUNTER — Encounter: Payer: Self-pay | Admitting: Family Medicine

## 2016-03-25 ENCOUNTER — Other Ambulatory Visit: Payer: Self-pay | Admitting: Physician Assistant

## 2016-03-25 VITALS — BP 128/80 | HR 77 | Temp 97.1°F | Ht 67.0 in | Wt 278.6 lb

## 2016-03-25 DIAGNOSIS — E119 Type 2 diabetes mellitus without complications: Secondary | ICD-10-CM

## 2016-03-25 DIAGNOSIS — M109 Gout, unspecified: Secondary | ICD-10-CM

## 2016-03-25 DIAGNOSIS — I10 Essential (primary) hypertension: Secondary | ICD-10-CM | POA: Diagnosis not present

## 2016-03-25 DIAGNOSIS — S12031S Nondisplaced posterior arch fracture of first cervical vertebra, sequela: Secondary | ICD-10-CM

## 2016-03-25 LAB — BAYER DCA HB A1C WAIVED: HB A1C: 6.8 % (ref <7.0)

## 2016-03-25 LAB — CMP14+EGFR
ALBUMIN: 4.2 g/dL (ref 3.6–4.8)
ALT: 37 IU/L (ref 0–44)
AST: 21 IU/L (ref 0–40)
Albumin/Globulin Ratio: 1.7 (ref 1.2–2.2)
Alkaline Phosphatase: 70 IU/L (ref 39–117)
BILIRUBIN TOTAL: 0.4 mg/dL (ref 0.0–1.2)
BUN / CREAT RATIO: 12 (ref 10–24)
BUN: 10 mg/dL (ref 8–27)
CALCIUM: 9.3 mg/dL (ref 8.6–10.2)
CO2: 23 mmol/L (ref 18–29)
CREATININE: 0.81 mg/dL (ref 0.76–1.27)
Chloride: 98 mmol/L (ref 96–106)
GFR, EST AFRICAN AMERICAN: 105 mL/min/{1.73_m2} (ref >59)
GFR, EST NON AFRICAN AMERICAN: 91 mL/min/{1.73_m2} (ref >59)
GLUCOSE: 154 mg/dL — AB (ref 65–99)
Globulin, Total: 2.5 g/dL (ref 1.5–4.5)
Potassium: 4.3 mmol/L (ref 3.5–5.2)
Sodium: 140 mmol/L (ref 134–144)
TOTAL PROTEIN: 6.7 g/dL (ref 6.0–8.5)

## 2016-03-25 LAB — LIPID PANEL
CHOL/HDL RATIO: 2.5 ratio (ref 0.0–5.0)
Cholesterol, Total: 84 mg/dL — ABNORMAL LOW (ref 100–199)
HDL: 33 mg/dL — AB (ref >39)
LDL CALC: 13 mg/dL (ref 0–99)
Triglycerides: 191 mg/dL — ABNORMAL HIGH (ref 0–149)
VLDL CHOLESTEROL CAL: 38 mg/dL (ref 5–40)

## 2016-03-25 MED ORDER — COLCHICINE 0.6 MG PO TABS: 0.6000 mg | ORAL_TABLET | Freq: Every day | ORAL | 3 refills | Status: DC

## 2016-03-25 NOTE — Progress Notes (Signed)
BP 128/80   Pulse 77   Temp 97.1 F (36.2 C) (Oral)   Ht _0  (1.702 m)   Wt 278 lb 9.6 oz (126.4 kg)   BMI 43.63 kg/m    Subjective:    Patient ID: Kenneth Fuller, male    DOB: [birth year], 70 y.o.   MRN: [id number]  HPI: Kenneth Fuller is a 71 y.o. male presenting on 03/25/2016 for Diabetes (3 month recheck); Gout; and Hypertension   HPI Diabetes recheck Patient is coming in today for recheck on his type 2 diabetes. He is currently on Jardiance and metformin. Patient is also on a statin but is not currently on an ACE inhibitor. He has seen an ophthalmologist about 2 months ago up at the local [place] he had his diabetic eye exam and says that he was not having any issues with it. We do not have these records but will request them. He says his blood sugars been running around 100-120 in the mornings.  Hypertension Patient's blood pressure is 128/80 and he is currently on amlodipine and Lopressor. Patient denies headaches, blurred vision, chest pains, shortness of breath, or weakness. Denies any side effects from medication and is content with current medication.   Right ankle pain Patient has had gout in his right ankle before and he feels like it is flaring up again like it has previously. He has used colchicine previously which worked pretty well for him. He says is been at least 2 years since he had another flare like this. The pain has been coming on over the past 2 days. He has not taken anything to help with that just yet.  Relevant past medical, surgical, family and social history reviewed and updated as indicated. Interim medical history since our last visit reviewed. Allergies and medications reviewed and updated.  Review of Systems  Constitutional: Negative for chills and fever.  Respiratory: Negative for shortness of breath and wheezing.   Cardiovascular: Negative for chest pain and leg swelling.  Musculoskeletal: Positive for arthralgias and joint swelling.  Negative for back pain and gait problem.  Skin: Negative for rash.  Neurological: Negative for dizziness, weakness and headaches.  All other systems reviewed and are negative.  Per HPI unless specifically indicated above     Objective:    BP 128/80   Pulse 77   Temp 97.1 F (36.2 C) (Oral)   Ht _1  (1.702 m)   Wt 278 lb 9.6 oz (126.4 kg)   BMI 43.63 kg/m   Wt Readings from Last 3 Encounters:  03/25/16 278 lb 9.6 oz (126.4 kg)  03/04/16 278 lb 6.4 oz (126.3 kg)  02/19/16 275 lb (124.7 kg)    Physical Exam  Constitutional: He is oriented to person, place, and time. He appears well-developed and well-nourished. No distress.  Eyes: Conjunctivae are normal. Right eye exhibits no discharge. Left eye exhibits no discharge. No scleral icterus.  Cardiovascular: Normal rate, regular rhythm, normal heart sounds and intact distal pulses.   No murmur heard. Pulmonary/Chest: Effort normal and breath sounds normal. No respiratory distress. He has no wheezes. He has no rales.  Musculoskeletal: Normal range of motion. He exhibits no edema.       Right ankle: He exhibits swelling. Tenderness (Internist throughout the right joint).  Neurological: He is alert and oriented to person, place, and time. Coordination normal.  Skin: Skin is warm and dry. No rash noted. He is not diaphoretic.  Psychiatric: He has a normal mood and  affect. His behavior is normal.  Nursing note and vitals reviewed.   Diabetic Foot Exam - Simple   Simple Foot Form Diabetic Foot exam was performed with the following findings:  Yes 03/25/2016 11:46 AM  Visual Inspection No deformities, no ulcerations, no other skin breakdown bilaterally:  Yes Sensation Testing Intact to touch and monofilament testing bilaterally:  Yes Pulse Check Posterior Tibialis and Dorsalis pulse intact bilaterally:  Yes Comments Has some swelling and left ankle from gout but otherwise normal exam        Assessment & Plan:   Problem List  Items Addressed This Visit      Cardiovascular and Mediastinum   Essential hypertension - Primary   Relevant Orders   CMP14+EGFR (Completed)   Lipid panel (Completed)   Microalbumin / creatinine urine ratio (Completed)     Endocrine   Diabetes (HCC)   Relevant Orders   CMP14+EGFR (Completed)   Lipid panel (Completed)   Bayer DCA Hb A1c Waived (Completed)   Microalbumin / creatinine urine ratio (Completed)     Other   Gout   Relevant Medications   colchicine 0.6 MG tablet       Follow up plan: Return in about 3 months (around 06/22/2016), or if symptoms worsen or fail to improve, for Recheck hypertension and cholesterol and diabetes.  Counseling provided for all of the vaccine components Orders Placed This Encounter  Procedures  . CMP14+EGFR  . Lipid panel  . Bayer DCA Hb A1c Waived  . Microalbumin / creatinine urine ratio    Caryl Pina, MD [facility] 03/25/2016, 11:45 AM

## 2016-03-26 ENCOUNTER — Encounter: Payer: Self-pay | Admitting: Cardiovascular Disease

## 2016-03-26 ENCOUNTER — Ambulatory Visit
Admission: RE | Admit: 2016-03-26 | Discharge: 2016-03-26 | Disposition: A | Discharge status: Home / Self Care | Payer: PPO | Source: Ambulatory Visit | Attending: Physician Assistant | Admitting: Physician Assistant

## 2016-03-26 ENCOUNTER — Ambulatory Visit (INDEPENDENT_AMBULATORY_CARE_PROVIDER_SITE_OTHER): Payer: PPO | Admitting: Cardiovascular Disease

## 2016-03-26 VITALS — BP 143/82 | HR 81 | Ht 67.0 in | Wt 279.6 lb

## 2016-03-26 DIAGNOSIS — S12001D Unspecified nondisplaced fracture of first cervical vertebra, subsequent encounter for fracture with routine healing: Secondary | ICD-10-CM | POA: Diagnosis not present

## 2016-03-26 DIAGNOSIS — E119 Type 2 diabetes mellitus without complications: Secondary | ICD-10-CM

## 2016-03-26 DIAGNOSIS — E785 Hyperlipidemia, unspecified: Secondary | ICD-10-CM | POA: Diagnosis not present

## 2016-03-26 DIAGNOSIS — G4733 Obstructive sleep apnea (adult) (pediatric): Secondary | ICD-10-CM

## 2016-03-26 DIAGNOSIS — I2510 Atherosclerotic heart disease of native coronary artery without angina pectoris: Secondary | ICD-10-CM

## 2016-03-26 DIAGNOSIS — S12031S Nondisplaced posterior arch fracture of first cervical vertebra, sequela: Secondary | ICD-10-CM

## 2016-03-26 DIAGNOSIS — I10 Essential (primary) hypertension: Secondary | ICD-10-CM

## 2016-03-26 DIAGNOSIS — R0609 Other forms of dyspnea: Secondary | ICD-10-CM | POA: Diagnosis not present

## 2016-03-26 DIAGNOSIS — Z9989 Dependence on other enabling machines and devices: Secondary | ICD-10-CM | POA: Diagnosis not present

## 2016-03-26 DIAGNOSIS — S12000A Unspecified displaced fracture of first cervical vertebra, initial encounter for closed fracture: Secondary | ICD-10-CM | POA: Diagnosis not present

## 2016-03-26 LAB — MICROALBUMIN / CREATININE URINE RATIO
CREATININE, UR: 54.2 mg/dL
Microalb/Creat Ratio: 29.7 mg/g creat (ref 0.0–30.0)
Microalbumin, Urine: 16.1 ug/mL

## 2016-03-26 NOTE — Patient Instructions (Signed)
Dr Croitoru recommends that you schedule a follow-up appointment in 12 months. You will receive a reminder letter in the mail two months in advance. If you don't receive a letter, please call our office to schedule the follow-up appointment.  If you need a refill on your cardiac medications before your next appointment, please call your pharmacy. 

## 2016-03-26 NOTE — Progress Notes (Signed)
Cardiology Office Note    Date:  03/26/2016   ID:  Kenneth Fuller, DOB 07-13-1945, MRN [id number]  PCP:  Worthy Rancher, MD  Cardiologist:   Sanda Klein, MD   Chief Complaint  Patient presents with  . Follow-up  . Dizziness    occasionally.  . Edema    in lef ankle.    History of Present Illness:  Kenneth Fuller is a 71 y.o. male with morbid obesity, obstructive sleep apnea, hyperlipidemia, diabetes mellitus complicated by neuropathy, minor nonobstructive coronary atherosclerosis.  In November, he tripped in the dark and fell head first into his bathtub. He fractured C1 vertebral and had transient symptoms of paraplegia and loss of sensation beneath shoulder level. He is wearing a hard collar and has not required surgery. He is being followed by Dr. Rolena Infante at [place].  He is wearing CPAP consistently now. Adding a chinstrap has made all the difference to tolerability and deficiency of the device.  Most recent labs were fair. He does point out that he had coffee with creamer and a pastry before the labs were drawn. This might explain the mild hyperglycemia and hypertriglyceridemia he is not taking any medication for hyperuricemia and uric acid was not checked on the most recent labs.  Following his neck injury, he has been less active. It's harder to assess his functional status. Activities that he can engage him did not make him short of breath. He does describe occasional chest discomfort associated with meals, described as "heartburn" and resolving with antacids. He does not have leg edema. He has had a gout attack in his left ankle. He has very mild bilateral ankle edema.  The echocardiogram performed in November 2016 showed normal left ventricular systolic function and regional wall motion. There were some findings suggestive of diastolic dysfunction and there was mild left ventricular hypertrophy, but the Doppler study did not clearly demonstrate  elevated filling pressure. He had coronary angiography in 2011 for what ended up being a "false positive" nuclear stress test related to diaphragmatic attenuation of the inferior wall. There was 20-40% scattered atherosclerosis in the LAD artery, without any lesions in the vessel supplying the inferior wall.    Past Medical History:  Diagnosis Date  . Allergy   . CAD (coronary artery disease)   . Diabetes mellitus   . Fibromyalgia   . GERD (gastroesophageal reflux disease)   . GI bleeding   . Gout   . Hyperlipidemia   . Hypertension   . Hypogonadism male   . MRSA cellulitis   . Neuropathy ([place])   . Obesity   . Shortness of breath dyspnea    with exertion   . Sleep apnea    cpap- 14   . Wheezing    no asthma diagnosis    Past Surgical History:  Procedure Laterality Date  . BACK SURGERY    . CARDIAC CATHETERIZATION    . COLONOSCOPY    . LUMBAR LAMINECTOMY/DECOMPRESSION MICRODISCECTOMY N/A 01/02/2014   Procedure: CENTRAL DECOMPRESSION LUMBAR LAMINECTOMY L3-L4, L4-L5;  Surgeon: Tobi Bastos, MD;  Location: [facility];  Service: Orthopedics;  Laterality: N/A;  . neck fusion      Current Medications: Outpatient Medications Prior to Visit  Medication Sig Dispense Refill  . albuterol (PROVENTIL HFA;VENTOLIN HFA) 108 (90 Base) MCG/ACT inhaler Inhale 2 puffs into the lungs every 6 (six) hours as needed for wheezing or shortness of breath. 1 Inhaler 2  . amLODipine (NORVASC) 10 MG tablet TAKE  1/2 (5MG) BY MOUTH DAILY 45 tablet 0  . atorvastatin (LIPITOR) 40 MG tablet TAKE ONE TABLET BY MOUTH ONCE DAILY 90 tablet 0  . Blood Glucose Monitoring Suppl (PRODIGY BLOOD GLUCOSE MONITOR) W/DEVICE KIT 1 Units by Does not apply route daily. Check blood sugars 1X per day-- dx 250.02 1 each 0  . Cholecalciferol (VITAMIN D PO) Take 1 tablet by mouth every morning.    . colchicine 0.6 MG tablet Take 1 tablet (0.6 mg total) by mouth daily. 30 tablet 3  . empagliflozin (JARDIANCE) 10 MG TABS tablet  Take 10 mg by mouth daily. 28 tablet 0  . fluticasone (FLONASE) 50 MCG/ACT nasal spray Place 2 sprays into both nostrils daily. 16 g 6  . Fluticasone-Salmeterol (ADVAIR) 100-50 MCG/DOSE AEPB Inhale 1 puff into the lungs 2 (two) times daily. 1 each 2  . furosemide (LASIX) 40 MG tablet TAKE 1 BY MOUTH TWICE DAILY (Patient taking differently: Take 40 mg by mouth daily. ) 180 tablet 0  . glucose blood test strip Test 1X per day and prn- Dx Code E11.9. One Touch Ultra Test Strips 100 each 2  . KLOR-CON M10 10 MEQ tablet TAKE ONE TABLET BY MOUTH ONCE DAILY 90 tablet 0  . metFORMIN (GLUCOPHAGE) 1000 MG tablet TAKE ONE TABLET BY MOUTH TWICE DAILY WITH A MEAL 180 tablet 0  . metoprolol (LOPRESSOR) 50 MG tablet Take 50 mg by mouth 2 (two) times daily.    [name] omeprazole (PRILOSEC) 40 MG capsule TAKE ONE CAPSULE BY MOUTH ONCE DAILY 90 capsule 1  . ONETOUCH DELICA LANCETS 05L MISC 1 each by Does not apply route daily. Test 1X per day and prn - DX 250.02 100 each 11  . tizanidine (ZANAFLEX) 2 MG capsule Take 1 capsule (2 mg total) by mouth 3 (three) times daily. 30 capsule 1  . traMADol (ULTRAM) 50 MG tablet Take 1 tablet (50 mg total) by mouth every 8 (eight) hours as needed. 40 tablet 0   No facility-administered medications prior to visit.      Allergies:   Amlodipine besy-benazepril hcl; Phenergan [promethazine hcl]; and Hydrocodone   Social History   Social History  . Marital status: Married    Spouse name: N/A  . Number of children: 3  . Years of education: N/A   Occupational History  . retired Disabled   Social History Main Topics  . Smoking status: Former Smoker    Quit date: 02/08/1969  . Smokeless tobacco: Former Systems developer  . Alcohol use No  . Drug use: No  . Sexual activity: Not Asked   Other Topics Concern  . None   Social History Narrative   Drinks caffeine tea occasionally      Family History:  The patient's family history includes Colon cancer in his mother; Diabetes in his  father; Heart disease in his father; Kidney disease in his sister.   ROS:   Please see the history of present illness.    ROS All other systems reviewed and are negative.   PHYSICAL EXAM:   VS:  BP (!) 143/82   Pulse 81   Ht _0  (1.702 m)   Wt 126.8 kg (279 lb 9.6 oz)   BMI 43.79 kg/m    GEN: Morbid obesity, well developed, in no acute distress  HEENT: normal except crowded oropharynx Neck: no JVD, carotid bruits, or masses Cardiac: RRR; no murmurs, rubs, or gallops,no edema  Respiratory:  clear to auscultation bilaterally, normal work of breathing GI: soft, nontender,  nondistended, + BS MS: no deformity or atrophy  Skin: warm and dry, no rash Neuro:  Alert and Oriented x 3, Strength and sensation are intact Psych: euthymic mood, full affect  Wt Readings from Last 3 Encounters:  03/26/16 126.8 kg (279 lb 9.6 oz)  03/25/16 126.4 kg (278 lb 9.6 oz)  03/04/16 126.3 kg (278 lb 6.4 oz)      Studies/Labs Reviewed:   EKG:  EKG is not ordered today.   Recent Labs: 08/21/2015: BNP 72.6; TSH 2.300 12/27/2015: Hemoglobin 13.7; Platelets 270 03/25/2016: ALT 37; BUN 10; Creatinine, Ser 0.81; Potassium 4.3; Sodium 140   Lipid Panel    Component Value Date/Time   CHOL 84 (L) 03/25/2016 1148   CHOL 111 06/12/2012 1232   TRIG 191 (H) 03/25/2016 1148   TRIG 141 01/24/2014 1143   TRIG 192 (H) 06/12/2012 1232   HDL 33 (L) 03/25/2016 1148   HDL 38 (L) 01/24/2014 1143   HDL 33 (L) 06/12/2012 1232   CHOLHDL 2.5 03/25/2016 1148   CHOLHDL 4.8 11/22/2006 1712   VLDL 39 11/22/2006 1712   LDLCALC 13 03/25/2016 1148   LDLCALC 27 10/25/2013 1224   LDLCALC 40 06/12/2012 1232    Labs from 08/21/2015 BNP 72.6, hemoglobin A1c increased from 7.1% to 7.9%, creatinine 0.8, potassium 4.4, normal liver function tests, normal TSH Total cholesterol 96, triglycerides 145, HDL 40, LDL 27   ASSESSMENT:    1. Dyspnea on exertion   2. OSA on CPAP   3. Essential hypertension   4. Coronary  artery disease involving native coronary artery of native heart without angina pectoris   5. Hyperlipidemia with target LDL less than 100   6. Type 2 diabetes mellitus without complication, without long-term current use of insulin (HCC)   7. Morbid obesity ([place])   8. Closed nondisplaced fracture of first cervical vertebra with routine healing, unspecified fracture morphology, subsequent encounter      PLAN:  In order of problems listed above:  1. Dyspnea: I think the most likely cause for his dyspnea remains morbid obesity and obstructive sleep apnea. Currently very inactive due to his neck injury. We'll have to reassess when he is able to do more physical activity. 2. OSA: Adding a chinstrap has improved compliance.  3. HTN: Blood pressure is well controlled 4. CAD: Previous coronary angiography showed only minor nonobstructive atherosclerosis 5. HLP: Lipid profile parameters are at target, very mild elevation in triglycerides, but labs were not truly fasting 6. DM: Don't have his most recent A1c 7. Ultimately his morbid obesity is his biggest liability and likely responsible for many of his symptoms. 8. C1 vertebral fracture: Managed conservatively, seeing Dr. Rolena Infante. Needs to ramp up physical activity once cleared    Medication Adjustments/Labs and Tests Ordered: Current medicines are reviewed at length with the patient today.  Concerns regarding medicines are outlined above.  Medication changes, Labs and Tests ordered today are listed in the Patient Instructions below. Patient Instructions  Dr Sallyanne Kuster recommends that you schedule a follow-up appointment in 12 months. You will receive a reminder letter in the mail two months in advance. If you don't receive a letter, please call our office to schedule the follow-up appointment.  If you need a refill on your cardiac medications before your next appointment, please call your pharmacy.    Signed, Sanda Klein, MD  03/26/2016 12:53  PM    [address], [place], [place]  [address] Phone: [phone]; Fax: [phone]

## 2016-04-07 DIAGNOSIS — N3942 Incontinence without sensory awareness: Secondary | ICD-10-CM | POA: Diagnosis not present

## 2016-04-07 DIAGNOSIS — R3912 Poor urinary stream: Secondary | ICD-10-CM | POA: Diagnosis not present

## 2016-04-09 ENCOUNTER — Telehealth: Payer: Self-pay | Admitting: Family Medicine

## 2016-04-12 NOTE — Telephone Encounter (Signed)
Faxed to [facility] of [place]

## 2016-04-14 DIAGNOSIS — G4733 Obstructive sleep apnea (adult) (pediatric): Secondary | ICD-10-CM | POA: Diagnosis not present

## 2016-04-27 ENCOUNTER — Other Ambulatory Visit: Payer: Self-pay | Admitting: Family Medicine

## 2016-04-28 ENCOUNTER — Other Ambulatory Visit: Payer: Self-pay | Admitting: Urology

## 2016-04-28 DIAGNOSIS — Z711 Person with feared health complaint in whom no diagnosis is made: Secondary | ICD-10-CM | POA: Diagnosis not present

## 2016-04-28 DIAGNOSIS — N358 Other urethral stricture: Secondary | ICD-10-CM | POA: Diagnosis not present

## 2016-04-28 DIAGNOSIS — N471 Phimosis: Secondary | ICD-10-CM | POA: Diagnosis not present

## 2016-04-28 DIAGNOSIS — L986 Other infiltrative disorders of the skin and subcutaneous tissue: Secondary | ICD-10-CM | POA: Diagnosis not present

## 2016-05-03 ENCOUNTER — Telehealth: Payer: Self-pay | Admitting: Family Medicine

## 2016-05-03 MED ORDER — SITAGLIPTIN PHOSPHATE 100 MG PO TABS
100.0000 mg | ORAL_TABLET | Freq: Every day | ORAL | 3 refills | Status: DC
Start: 2016-05-03 — End: 2016-05-06

## 2016-05-03 NOTE — Telephone Encounter (Signed)
Samples of jardiance placed up front. Patient wants to know if he can be switched to something cheaper.

## 2016-05-03 NOTE — Telephone Encounter (Signed)
Pt notified of Dr [name]'s recommendation Will call back to schedule appt

## 2016-05-06 ENCOUNTER — Encounter: Payer: Self-pay | Admitting: Pharmacist

## 2016-05-06 ENCOUNTER — Ambulatory Visit (INDEPENDENT_AMBULATORY_CARE_PROVIDER_SITE_OTHER): Payer: PPO | Admitting: Pharmacist

## 2016-05-06 VITALS — BP 120/82 | Ht 67.0 in | Wt 273.5 lb

## 2016-05-06 DIAGNOSIS — E119 Type 2 diabetes mellitus without complications: Secondary | ICD-10-CM | POA: Diagnosis not present

## 2016-05-06 MED ORDER — EMPAGLIFLOZIN 10 MG PO TABS: 10.0000 mg | ORAL_TABLET | Freq: Every day | ORAL | 0 refills | Status: DC

## 2016-05-06 NOTE — Progress Notes (Signed)
Patient ID: Kenneth Fuller, male   DOB: [birth year], 71 y.o.   MRN: [id number]   Subjective:    Kenneth Fuller is a 71 y.o. male who presents for an initial evaluation of Type 2 diabetes mellitus.  Kenneth Fuller was referred by his PCP, Dr [name] to discuss medication options for type 2 DM.  Kenneth Fuller has been taking metformin 1067m bid and Jardiance 154mdaily.  His last A1c was 6.8%.  However he is concerned with the cost of Jardiance.    Kenneth Fuller [employer] Ad[employer]age (level 2) benefits with Medicare.  I pulled up benefits summary and formulary.  [name] consider tier 3 (preferred Brand name medication)  Tier 1: Preferred Generics - $0 copay for retail and $0 copay for 90 days mail order Tier 2: Generics - $12 copay for retail and $24 copay for 90 days mail order Tier 3: Preferred Brand - $40 copay for retail and $80 copay for 90 days mail order Tier 4: Non-Preferred Drugs - $75 for retail and $150 copay for 90 days mail order Tier 5: Specialty Drugs - 33% of cost for retail and 33% of cost for 90 days mail order   Current monitoring regimen: home blood tests - daily to every other day Home blood sugar records: per patient ranges from 120 to 191 Any episodes of hypoglycemia? no  Known diabetic complications: peripheral neuropathy and cardiovascular disease Cardiovascular risk factors: advanced age (older than 5545or men, 6572or women), diabetes mellitus, dyslipidemia, hypertension, male gender, obesity (BMI >= 30 kg/m2) and sedentary lifestyle  Eye exam current (within one year): yes Weight trend: decreasing steadily - since starting Jardiance Prior visit with CDE: no Current diet: in general, an "unhealthy" diet Current exercise: none Medication Compliance?  Yes   Is He on ACE inhibitor or angiotensin II receptor blocker?  No  - Kenneth Fuller had taken losartan in 2013 but I was unable to determine why it was stopped      The following portions of the  patient's history were reviewed and updated as appropriate: allergies, current medications and problem list.    Objective:    BP 120/82   Ht _0  (1.702 m)   Wt 273 lb 8 oz (124.1 kg)   BMI 42.84 kg/m   Lab Review Glucose (mg/dL)  Date Value  03/25/2016 154 (H)   Glucose, Bld (mg/dL)  Date Value  12/27/2015 140 (H)  12/26/2015 147 (H)  12/26/2015 150 (H)   CO2 (mmol/L)  Date Value  03/25/2016 23  12/27/2015 25  12/26/2015 26   BUN (mg/dL)  Date Value  03/25/2016 10  12/27/2015 13  12/26/2015 17  12/26/2015 13  08/21/2015 10  05/02/2015 13   Creat (mg/dL)  Date Value  06/12/2012 1.04   Creatinine, Ser (mg/dL)  Date Value  03/25/2016 0.81  12/27/2015 0.85  12/26/2015 0.70   A1c = 6.8% (03/25/2016)    Assessment:    Diabetes Mellitus type II, under adequate control.   Obesity - weight has decreased about 6lbs since starting Jardiance   Plan:    1.  Rx changes: none   Discussed pros and cons of various meds to treat DM - SGLT2 (jardiance), sulfonylureas, TZDs.  Discussed insurance coverage, Medicare coverage gap considerations.    I gave patient #[phone] samples for Jardiance 1020mo continue until next appt with PCP.  He will discuss options with his wife and let me know - he has leaning toward continueing jardiance  and getting through mail order.   Continue metformin 1035m bid 2.  Education: Reviewed 'ABCs' of diabetes management (respective goals in parentheses):  A1C (<7), blood pressure (<130/80), and cholesterol (LDL <100). 3. CHO counting diet discussed.  Reviewed CHO amount in various foods and how to read nutrition labels.  Discussed recommended serving sizes.  4.  Recommend check BG every other day But instructed to vary times he checks - am, pm, pre and post prandial. 5.  Recommended increase physical activity - goal is 150 minutes per week 6. Follow up: 4 weeks with PCP

## 2016-05-06 NOTE — Patient Instructions (Signed)
Diabetes and Standards of Medical Care   Diabetes is complicated. You may find that your diabetes team includes a dietitian, nurse, diabetes educator, eye doctor, and more. To help everyone know what is going on and to help you get the care you deserve, the following schedule of care was developed to help keep you on track. Below are the tests, exams, vaccines, medicines, education, and plans you will need.  Blood Glucose Goals Prior to meals = 80 - 130 Within 2 hours of the start of a meal = less than 180  HbA1c test (goal is less than 7.0% - your last value was 6.8%) This test shows how well you have controlled your glucose over the past 2 to 3 months. It is used to see if your diabetes management plan needs to be adjusted.   It is performed at least 2 times a year if you are meeting treatment goals.  It is performed 4 times a year if therapy has changed or if you are not meeting treatment goals.  Blood pressure test  This test is performed at every routine medical visit. The goal is less than 140/90 mmHg for most people, but 130/80 mmHg in some cases. Ask your health care provider about your goal.  Dental exam  Follow up with the dentist regularly.  Eye exam  If you are diagnosed with type 1 diabetes as a child, get an exam upon reaching the age of 56 years or older and have had diabetes for 3 to 5 years. Yearly eye exams are recommended after that initial eye exam.  If you are diagnosed with type 1 diabetes as an adult, get an exam within 5 years of diagnosis and then yearly.  If you are diagnosed with type 2 diabetes, get an exam as soon as possible after the diagnosis and then yearly.  Foot care exam  Visual foot exams are performed at every routine medical visit. The exams check for cuts, injuries, or other problems with the feet.  A comprehensive foot exam should be done yearly. This includes visual inspection as well as assessing foot pulses and testing for loss of  sensation.  Check your feet nightly for cuts, injuries, or other problems with your feet. Tell your health care provider if anything is not healing.  Kidney function test (urine microalbumin)  This test is performed once a year.  Type 1 diabetes: The first test is performed 5 years after diagnosis.  Type 2 diabetes: The first test is performed at the time of diagnosis.  A serum creatinine and estimated glomerular filtration rate (eGFR) test is done once a year to assess the level of chronic kidney disease (CKD), if present.  Lipid profile (cholesterol, HDL, LDL, triglycerides)  Performed every 5 years for most people.  The goal for LDL is less than 100 mg/dL. If you are at high risk, the goal is less than 70 mg/dL.  The goal for HDL is 40 mg/dL to 50 mg/dL for men and 50 mg/dL to 60 mg/dL for women. An HDL cholesterol of 60 mg/dL or higher gives some protection against heart disease.  The goal for triglycerides is less than 150 mg/dL.  Influenza vaccine, pneumococcal vaccine, and hepatitis B vaccine  The influenza vaccine is recommended yearly.  The pneumococcal vaccine is generally given once in a lifetime. However, there are some instances when another vaccination is recommended. Check with your health care provider.  The hepatitis B vaccine is also recommended for adults with diabetes.  Diabetes self-management education  Education is recommended at diagnosis and ongoing as needed.  Treatment plan  Your treatment plan is reviewed at every medical visit.  Document Released: 11/22/2008 Document Revised: 09/27/2012 Document Reviewed: 06/27/2012 [facility] Patient Information 2014 [place].

## 2016-05-10 ENCOUNTER — Other Ambulatory Visit: Payer: Self-pay | Admitting: Family Medicine

## 2016-05-15 DIAGNOSIS — G4733 Obstructive sleep apnea (adult) (pediatric): Secondary | ICD-10-CM | POA: Diagnosis not present

## 2016-05-17 ENCOUNTER — Other Ambulatory Visit: Payer: Self-pay | Admitting: Family Medicine

## 2016-05-19 ENCOUNTER — Encounter: Payer: Self-pay | Admitting: Neurology

## 2016-05-19 ENCOUNTER — Ambulatory Visit (INDEPENDENT_AMBULATORY_CARE_PROVIDER_SITE_OTHER): Payer: PPO | Admitting: Neurology

## 2016-05-19 VITALS — BP 183/97 | HR 78 | Resp 20 | Ht 67.0 in | Wt 268.0 lb

## 2016-05-19 DIAGNOSIS — R2689 Other abnormalities of gait and mobility: Secondary | ICD-10-CM | POA: Diagnosis not present

## 2016-05-19 DIAGNOSIS — S060X9S Concussion with loss of consciousness of unspecified duration, sequela: Secondary | ICD-10-CM

## 2016-05-19 NOTE — Progress Notes (Signed)
Subjective:    Patient ID: Kenneth Fuller is a 71 y.o. male.  HPI     Interim history:   Kenneth Fuller is a 71 year old right-handed gentleman with an underlying complex medical history of morbid obesity, Hx of neck surgery and low back surgery, GERD, OSA on CPAP, HTN, neck injury secondary to fall in November 2017, during which he sustained a C1 fracture, and left rotator cuff injury, who presents for follow-up consultation of his headaches and balance issues with status post head injury in November 2017. The patient is unaccompanied today. I first met him on 02/19/2016 at the request of his neurosurgeon, at which time the patient reported residual headaches and balance issues after he he had sustained a head injury secondary to accidental fall at home which resulted in C1 fracture. I suggested we proceed with a brain MRI and EEG. I also asked patient to refrain from taking any potentially sedating medications and use pain medication sparingly. He had a brain MRI without contrast on 02/24/2016 which showed: IMPRESSION:  Abnormal MRI brain showing mild changes of chronic microvascular ischemia and cortical atrophy. Mild paranasal chronic sinusitis changes. Large disc protusion at C 3-4 with cord compression and post operative changes of anterior cervical fusion at C 4 and below. Recomend MRI C Spine if clinically indicated   We called him with his test results.  He had an EEG on 03/17/2016 which showed: Impression: This is a normal EEG recording in the waking state. No evidence of ictal or interictal discharges are seen.  We called him with his test results.  Today, 05/19/2016: He reports doing better overall, but has tingling in both arms and hands. This, he feels is progressive. He reports a neck brace for about 12 weeks. After he had his latest neck CT which we reviewed today he was able to take the neck brace off per Dr. Rolena Infante instructions. He is worried about having problems with his neck  again. He had his prior neck surgery under Dr. Saintclair Halsted with great results several years ago, could be 10 years ago. He saw Dr. Gladstone Lighter for LBP. He reports no recurrent headaches or lightheadedness or new balance problems, has been active but develops tingling in both shoulder areas and radiating to both arms and hands with even normal day-to-day activities such as reaching. He has had no falls. Diabetes control is good. He has a routine follow-up with his primary care physician next month.  The patient's allergies, current medications, family history, past medical history, past social history, past surgical history and problem list were reviewed and updated as appropriate.   Previously (copied from previous notes for reference):   02/19/2016: He reports residual headaches and balace issues, post head injury on 12/26/15. He had LOC for some time and was calling out for his wife, but she could not hear him. He had gotten up to use the bathroom around 3 AM. He had not made enough light, she reports, and he did not like to turn on lights at night, but has been using a night light since the accidental fall. He has a cane and a walker from before, from when he had low back surgery.  I reviewed your office note from 02/18/15, which you kindly included. He was treated for his C1 fracture conservatively with a collar. He was admitted on 12/26/2015 after a fall in the bathtub at home. He was discharged on 12/27/2015. He had a head CT without contrast as well as a C-spine CT  without contrast on 12/26/2015 which I reviewed: IMPRESSION: CT BRAIN:   1. No acute intracranial process. 2. Small scalp contusion at the right frontal vertex. 3. Mild age-related cerebral atrophy with chronic small vessel ischemic disease.   CT CERVICAL SPINE:   1. Linear lucencies traversing the posterior ring of C1 bilaterally. While these are favored to be chronic in nature, these are somewhat age indeterminate, with possible acute  fractures not entirely excluded. Further evaluation with MRI is recommended to assess the chronicity of this finding. 2. No other acute traumatic injury within the cervical spine. 3. Status post ACDF at C4 through C7 without complication. He had a cervical spine MRI without contrast on 12/26/2015 which I reviewed: IMPRESSION: 1. A definite C1 fracture is not identified on the MRI but I believe the findings on the CT scan are real and there is abnormal prevertebral fluid and hemorrhage which would suggest a fracture. 2. Central disc protrusion at C3-4 with mild mass effect on the ventral thecal sac and mild bilateral foraminal encroachment. 3. Solid fusion changes from T4-S5 without complicating features. 4. Normal appearance of the cervical spinal cord. He had a repeat CT cervical spine without contrast on 02/06/2016 which I reviewed: IMPRESSION: 1. Linear lucency through the inferior margin of the right and left posterior arches of C1 which are unchanged compared with 12/26/2015 and may reflect incomplete ununited nondisplaced fractures versus vascular foramina. No significant interval change compared with 12/26/2015. 2. Anterior cervical disc fusion from C4 through C7.   As I understand, he will be using his hard neck collar for at least 6 more weeks. He has not been driving and has been advised not to drive secondary to his neck fracture and using a hard collar. He has not had any episodes of confusion or convulsions. He does not have any one-sided weakness or numbness. He does have some tingling in the right shoulder area and proximal right arm area. When he fell he initially was not able to move either arm or leg he remembers. He has a history of obstructive sleep apnea and has been trying to use his CPAP. He tries to sleep in the recliner for more comfort and ease of use with his CPAP. It is difficult for him to use his CPAP currently. He has intermittent right parietal headaches which  are short-lived, sometimes just a few minutes, as long as 5 or 10 minutes at most. They are moderately severe but not long enough for him to take any medication. Of note, he is not on any narcotic pain medication and takes as needed tramadol which he has been taking sparingly. He has not had any visual symptoms or speech impairment, memory seems stable and adequate.   His Past Medical History Is Significant For: Past Medical History:  Diagnosis Date  . Allergy   . CAD (coronary artery disease)   . Diabetes mellitus   . Fibromyalgia   . GERD (gastroesophageal reflux disease)   . GI bleeding   . Gout   . Hyperlipidemia   . Hypertension   . Hypogonadism male   . MRSA cellulitis   . Neuropathy ([place])   . Obesity   . Shortness of breath dyspnea    with exertion   . Sleep apnea    cpap- 14   . Wheezing    no asthma diagnosis    His Past Surgical History Is Significant For: Past Surgical History:  Procedure Laterality Date  . BACK SURGERY    .  CARDIAC CATHETERIZATION    . COLONOSCOPY    . LUMBAR LAMINECTOMY/DECOMPRESSION MICRODISCECTOMY N/A 01/02/2014   Procedure: CENTRAL DECOMPRESSION LUMBAR LAMINECTOMY L3-L4, L4-L5;  Surgeon: Tobi Bastos, MD;  Location: [facility];  Service: Orthopedics;  Laterality: N/A;  . neck fusion      His Family History Is Significant For: Family History  Problem Relation Age of Onset  . Colon cancer Mother   . Diabetes Father     siblings  . Heart disease Father     brother  . Kidney disease Sister   . Colon polyps Neg Hx     His Social History Is Significant For: Social History   Social History  . Marital status: Married    Spouse name: N/A  . Number of children: 3  . Years of education: N/A   Occupational History  . retired Disabled   Social History Main Topics  . Smoking status: Former Smoker    Quit date: 02/08/1969  . Smokeless tobacco: Former Systems developer  . Alcohol use No  . Drug use: No  . Sexual activity: Not Asked   Other Topics  Concern  . None   Social History Narrative   Drinks caffeine tea occasionally     His Allergies Are:  Allergies  Allergen Reactions  . Amlodipine Besy-Benazepril Hcl     Makes tongue swell  . Phenergan [Promethazine Hcl]     "I can't remember."  . Hydrocodone Itching    Can tolerate in low doses  :   His Current Medications Are:  Outpatient Encounter Prescriptions as of 05/19/2016  Medication Sig  . albuterol (PROVENTIL HFA;VENTOLIN HFA) 108 (90 Base) MCG/ACT inhaler Inhale 2 puffs into the lungs every 6 (six) hours as needed for wheezing or shortness of breath.  [name] amLODipine (NORVASC) 10 MG tablet TAKE 1/2 (5MG) BY MOUTH DAILY  . atorvastatin (LIPITOR) 40 MG tablet TAKE ONE TABLET BY MOUTH ONCE DAILY  . Blood Glucose Monitoring Suppl (PRODIGY BLOOD GLUCOSE MONITOR) W/DEVICE KIT 1 Units by Does not apply route daily. Check blood sugars 1X per day-- dx 250.02  . Cholecalciferol (VITAMIN D PO) Take 1 tablet by mouth every morning.  . colchicine 0.6 MG tablet Take 1 tablet (0.6 mg total) by mouth daily.  . empagliflozin (JARDIANCE) 10 MG TABS tablet Take 10 mg by mouth daily.  . fluticasone (FLONASE) 50 MCG/ACT nasal spray Place 2 sprays into both nostrils daily.  . Fluticasone-Salmeterol (ADVAIR) 100-50 MCG/DOSE AEPB Inhale 1 puff into the lungs 2 (two) times daily. (Patient taking differently: Inhale 1 puff into the lungs 2 (two) times daily as needed. )  . furosemide (LASIX) 40 MG tablet TAKE 1 BY MOUTH TWICE DAILY (Patient taking differently: Take 40 mg by mouth daily. )  . glucose blood test strip Test 1X per day and prn- Dx Code E11.9. One Touch Ultra Test Strips  . KLOR-CON M10 10 MEQ tablet TAKE ONE TABLET BY MOUTH ONCE DAILY  . metFORMIN (GLUCOPHAGE) 1000 MG tablet TAKE ONE TABLET BY MOUTH TWICE DAILY WITH A MEAL  . metoprolol (LOPRESSOR) 50 MG tablet Take 50 mg by mouth 2 (two) times daily.  [name] omeprazole (PRILOSEC) 40 MG capsule TAKE ONE CAPSULE BY MOUTH ONCE DAILY  .  ONETOUCH DELICA LANCETS 51Z MISC 1 each by Does not apply route daily. Test 1X per day and prn - DX 250.02  . tizanidine (ZANAFLEX) 2 MG capsule Take 1 capsule (2 mg total) by mouth 3 (three) times daily. (Patient taking differently: Take  2 mg by mouth 3 (three) times daily as needed. )  . traMADol (ULTRAM) 50 MG tablet Take 1 tablet (50 mg total) by mouth every 8 (eight) hours as needed.   No facility-administered encounter medications on file as of 05/19/2016.   :  Review of Systems:  Out of a complete 14 point review of systems, all are reviewed and negative with the exception of these symptoms as listed below:  Review of Systems  Neurological:       Pt presents today to discuss his numbness in both arms. Pt believes this may be related to his fall. Pt also wants to discuss MRI and CT results (even though the CT was not ordered by [facility]).     Objective:  Neurologic Exam  Physical Exam Physical Examination:   Vitals:   05/19/16 1111  BP: (!) 183/97  Pulse: 78  Resp: 20   General Examination: The patient is a very pleasant 71 y.o. male in no acute distress. He appears well-developed and well-nourished and well groomed. Good spirits.   HEENT: Normocephalic, atraumatic, pupils are equal, round and reactive to light and accommodation. Extraocular tracking is good without limitation to gaze excursion or nystagmus noted. Normal smooth pursuit is noted. Hearing is grossly intact. Face is symmetric with normal facial animation and normal facial sensation. Speech is clear with no dysarthria noted. There is no hypophonia. There is no lip, neck/head, jaw or voice tremor. Neck is mildly limited in range of motion and I did not do passive range of motion. He has an anterior neck scar. Airway examination reveals moderate mouth dryness, adequate dental hygiene, full dentures on top. Moderate airway crowding. Tongue protrudes centrally and palate elevates symmetrically.   Chest: Clear to auscultation  without wheezing, rhonchi or crackles noted.  Heart: S1+S2+0, regular and normal without murmurs, rubs or gallops noted.   Abdomen: Soft, non-tender and non-distended with normal bowel sounds appreciated on auscultation.  Extremities: There is no pitting edema in the distal lower extremities bilaterally. Pedal pulses are intact.  Skin: Warm and dry without trophic changes noted.  Musculoskeletal: exam reveals no obvious joint deformities, tenderness or joint swelling or erythema.   Neurologically:  Mental status: The patient is awake, alert and oriented in all 4 spheres. His immediate and remote memory, attention, language skills and fund of knowledge are appropriate. There is no evidence of aphasia, agnosia, apraxia or anomia. Speech is clear with normal prosody and enunciation. Thought process is linear. Mood is normal and affect is normal.  Cranial nerves II - XII are as described above under HEENT exam. In addition: shoulder shrug is normal with equal shoulder height noted. Motor exam: Normal bulk, strength and tone is noted. He reports mild tingling in both hands. Romberg is not tested for safety. Reflexes are 1+. Fine motor skills and coordination: intact with normal finger taps, normal hand movements, normal rapid alternating patting, normal foot taps and normal foot agility.  Cerebellar testing: No dysmetria or intention tremor on finger to nose testing. Heel to shin is unremarkable bilaterally. There is no truncal or gait ataxia.  Sensory exam: intact to light touch in the upper and lower extremities.  Gait, station and balance: He stands without significant difficulty. He stands slightly wide-based. Gait shows no limp and preserved arm swing. He turns well, tandem walk is not tested for safety.   Assessment and Plan:  In summary, Kenneth Fuller is a very pleasant 71 y.o.-year old male with an underlying complex medical  history of morbid obesity, Hx of neck surgery and low back  surgery, GERD, OSA on CPAP, HTN, neck injury secondary to fall in November 2017, during which he sustained a C1 fracture, and left rotator cuff injury, who presents for follow-up consultation of his headaches and balance issues with status post head injury in November 2017. From the neurological standpoint he is stable, workup with brain MRI and EEG was nonrevealing for any additional structural lesion or abnormal EEG findings. He has been able to remove his neck collar. He had an interim neck CT on 03/26/2016 which we reviewed. This was ordered by Dr. Rolena Infante' office as I understand. Patient is complaining of tingling that affects both upper extremities, radiating from the neck down to the shoulders and sometimes all the way to both hands. He does have a history of neck degenerative disease and multilevel surgery several years ago under Dr. Saintclair Halsted and has a disc protrusion at C3-4. He is encouraged to talk to Dr. Rolena Infante about his symptoms. From my end of things I suggested as needed follow-up. I answered all his questions today and he was in agreement.  I spent 25 minutes in total face-to-face time with the patient, more than 50% of which was spent in counseling and coordination of care, reviewing test results, reviewing medication and discussing or reviewing the diagnosis of concussion, its prognosis and treatment options. Pertinent laboratory and imaging test results that were available during this visit with the patient were reviewed by me and considered in my medical decision making (see chart for details).

## 2016-05-19 NOTE — Patient Instructions (Signed)
I think you should talk to Dr. Rolena Infante again.  Your exam from my end is stable and your tests were other otherwise non-revealing.

## 2016-06-02 IMAGING — CR DG CHEST 2V
2 series · 2 of 2 positions shown · non-contrast
Comparison: None.

CLINICAL DATA: Preop lumbar surgery.  Diabetes.  Hypertension.

EXAM:
CHEST  2 VIEW

[view not recorded (1 of 2)]
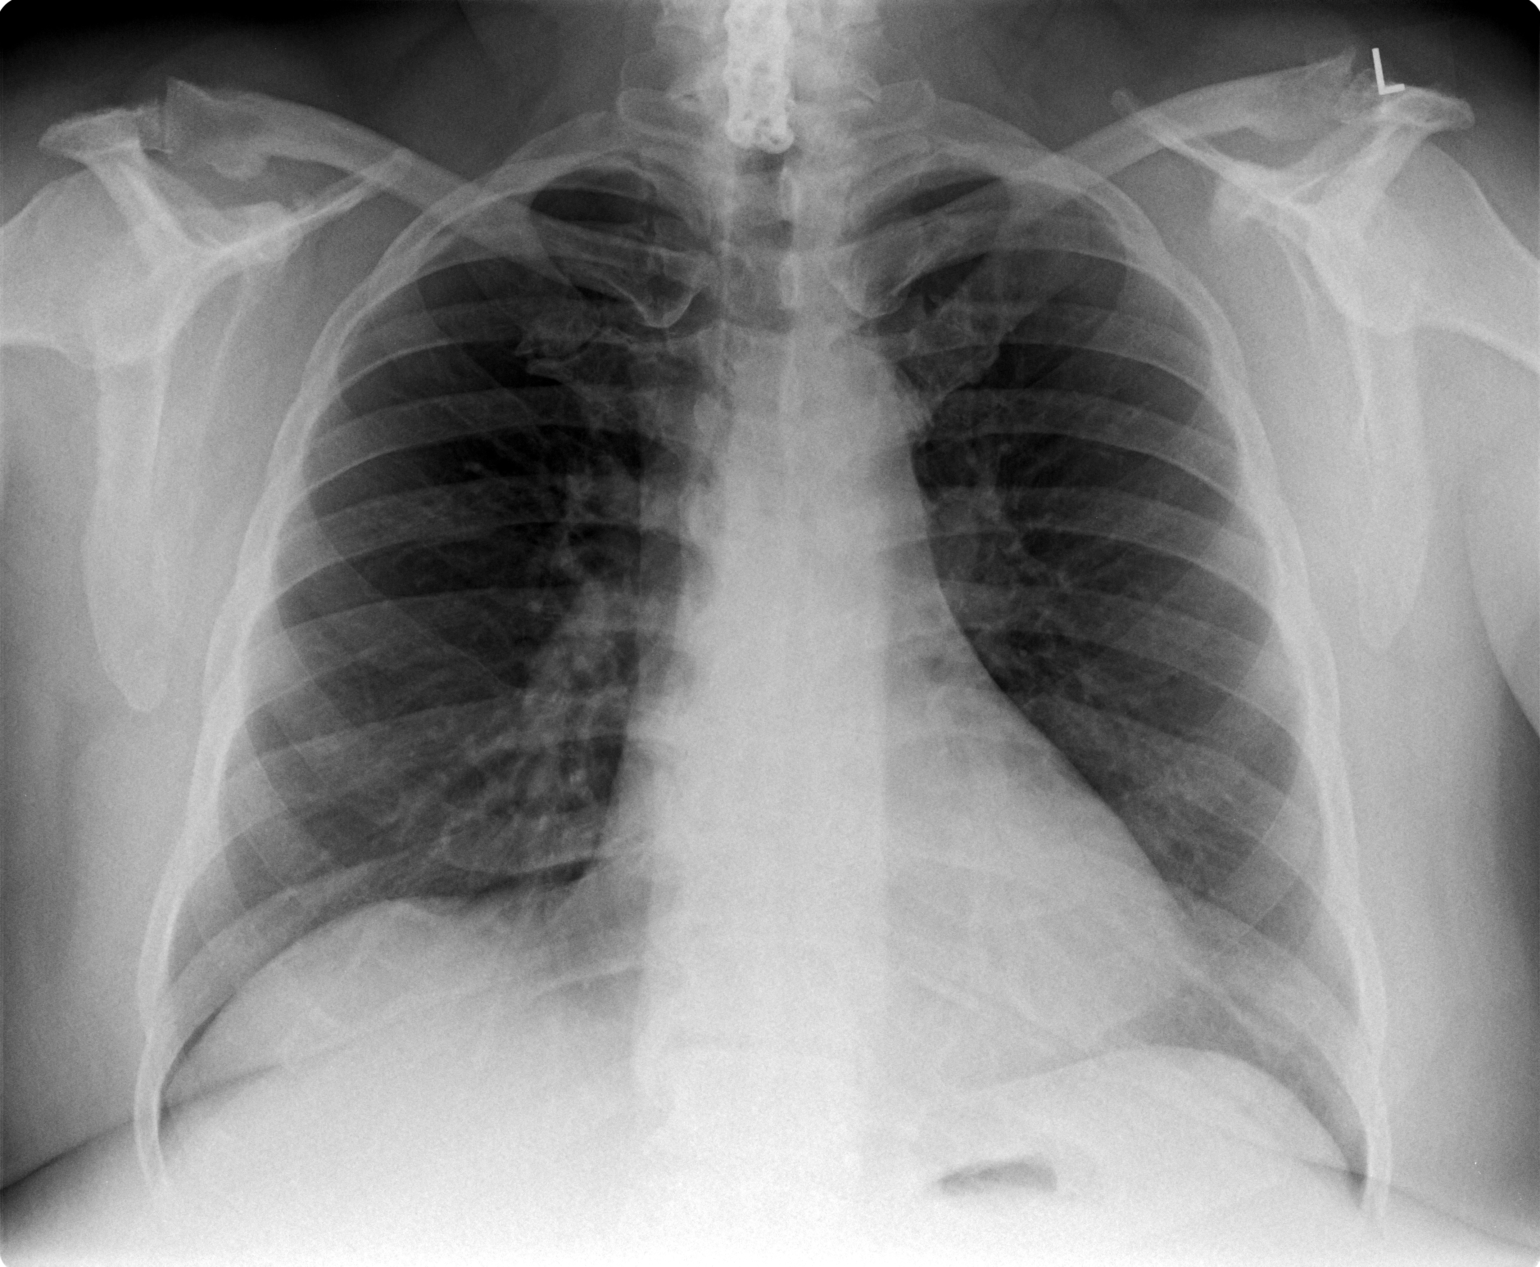

[view not recorded (2 of 2)]
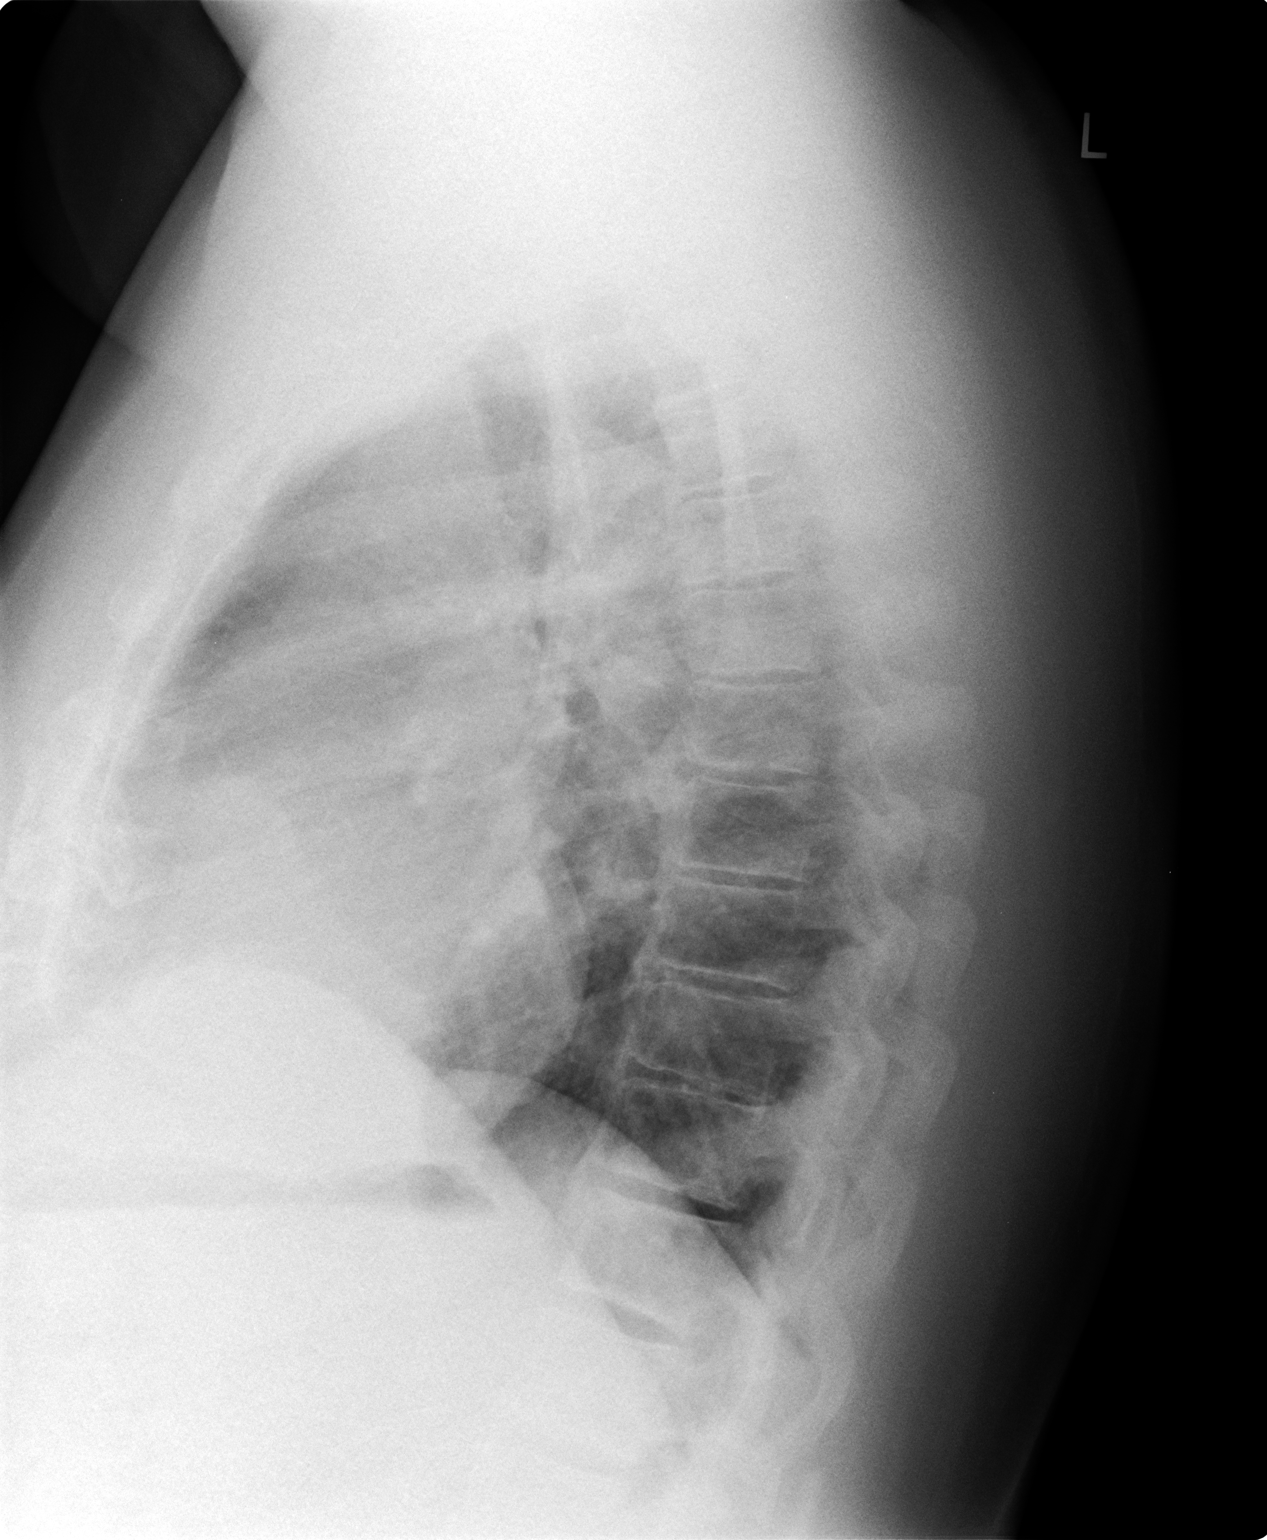

[2 of 2 positions shown; findings below may reference images not displayed]

FINDINGS: Mediastinum hilar structures normal. Lungs are clear. Cardiomegaly
with normal pulmonary vascularity. Cervical spine fusion.
Degenerative changes both shoulders and thoracic spine.
IMPRESSION: 1. Cardiomegaly, no evidence of overt congestive heart failure.
2. No acute pulmonary disease.

## 2016-06-14 DIAGNOSIS — G4733 Obstructive sleep apnea (adult) (pediatric): Secondary | ICD-10-CM | POA: Diagnosis not present

## 2016-06-15 ENCOUNTER — Other Ambulatory Visit: Payer: Self-pay | Admitting: Family Medicine

## 2016-06-23 ENCOUNTER — Other Ambulatory Visit: Payer: Self-pay | Admitting: Family Medicine

## 2016-06-23 ENCOUNTER — Encounter: Payer: Self-pay | Admitting: Family Medicine

## 2016-06-23 ENCOUNTER — Ambulatory Visit (INDEPENDENT_AMBULATORY_CARE_PROVIDER_SITE_OTHER): Payer: PPO | Admitting: Family Medicine

## 2016-06-23 DIAGNOSIS — I10 Essential (primary) hypertension: Secondary | ICD-10-CM

## 2016-06-23 DIAGNOSIS — E119 Type 2 diabetes mellitus without complications: Secondary | ICD-10-CM

## 2016-06-23 DIAGNOSIS — E785 Hyperlipidemia, unspecified: Secondary | ICD-10-CM

## 2016-06-23 LAB — BAYER DCA HB A1C WAIVED: HB A1C: 6.8 % (ref <7.0)

## 2016-06-23 NOTE — Progress Notes (Signed)
BP 135/80   Pulse 72   Temp 97.2 F (36.2 C) (Oral)   Ht _0  (1.702 m)   Wt 270 lb (122.5 kg)   BMI 42.29 kg/m    Subjective:    Patient ID: Kenneth Fuller, male    DOB: [birth year], 71 y.o.   MRN: [id number]  HPI: Kenneth Fuller is a 71 y.o. male presenting on 06/23/2016 for Diabetes (3 month followup; patient is not fasting; patient reports he has not been able to afford Jardiance and has getting samples from [place] - wondering if he can just stop it) and Hypertension   HPI Type 2 diabetes mellitus Patient comes in today for recheck of his diabetes. Patient has been currently taking Jardiance and. Patient is not currently on an ACE inhibitor. Patient has seen an ophthalmologist this year. Patient denies any issues with his feet. Patient given samples for Jardiance.  Hypertension Patient is currently on amlodipine and Lasix and metoprolol, and her blood pressure today is 135/80. Patient denies any lightheadedness or dizziness. Patient denies headaches, blurred vision, chest pains, shortness of breath, or weakness. Denies any side effects from medication and is content with current medication. Patient is morbidly obese and is something that we've discussed and he is trying to make changes but has not made any drastic changes.  Hyperlipidemia Patient is coming in for recheck of his hyperlipidemia. He is currently taking Lipitor 40 mg. He denies any issues with myalgias or history of liver damage from it. He denies any focal numbness or weakness or chest pain.   Relevant past medical, surgical, family and social history reviewed and updated as indicated. Interim medical history since our last visit reviewed. Allergies and medications reviewed and updated.  Review of Systems  Constitutional: Negative for chills and fever.  Respiratory: Negative for shortness of breath and wheezing.   Cardiovascular: Negative for chest pain and leg swelling.  Musculoskeletal: Negative for back  pain and gait problem.  Skin: Negative for rash.  Neurological: Negative for dizziness, weakness, light-headedness and headaches.  All other systems reviewed and are negative.   Per HPI unless specifically indicated above        Objective:    BP 135/80   Pulse 72   Temp 97.2 F (36.2 C) (Oral)   Ht _1  (1.702 m)   Wt 270 lb (122.5 kg)   BMI 42.29 kg/m   Wt Readings from Last 3 Encounters:  06/23/16 270 lb (122.5 kg)  05/19/16 268 lb (121.6 kg)  05/06/16 273 lb 8 oz (124.1 kg)    Physical Exam  Constitutional: He is oriented to person, place, and time. He appears well-developed and well-nourished. No distress.  Eyes: Conjunctivae are normal. No scleral icterus.  Neck: Neck supple. No thyromegaly present.  Cardiovascular: Normal rate, regular rhythm, normal heart sounds and intact distal pulses.   No murmur heard. Pulmonary/Chest: Effort normal and breath sounds normal. No respiratory distress. He has no wheezes. He has no rales.  Musculoskeletal: Normal range of motion. He exhibits no edema.  Lymphadenopathy:    He has no cervical adenopathy.  Neurological: He is alert and oriented to person, place, and time. Coordination normal.  Skin: Skin is warm and dry. No rash noted. He is not diaphoretic.  Psychiatric: He has a normal mood and affect. His behavior is normal.  Nursing note and vitals reviewed.   Results for orders placed or performed in visit on 05/12/16  [facility] DIABETES EYE EXAM  Result Value  Ref Range   HM Diabetic Eye Exam No Retinopathy No Retinopathy      Assessment & Plan:   Problem List Items Addressed This Visit      Cardiovascular and Mediastinum   Essential hypertension     Endocrine   Diabetes ([place])   Relevant Orders   Bayer DCA Hb A1c Waived (Completed)     Other   Hyperlipidemia with target LDL less than 100   Morbid obesity ([place]) - Primary     Continue current medications, no changes, check A1c today.   Follow up plan: Return in  about 3 months (around 09/23/2016), or if symptoms worsen or fail to improve, for Follow-up diabetes.  Counseling provided for all of the vaccine components Orders Placed This Encounter  Procedures  . [facility] Hb A1c [place], MD [facility] 06/23/2016, 11:10 AM

## 2016-07-15 DIAGNOSIS — G4733 Obstructive sleep apnea (adult) (pediatric): Secondary | ICD-10-CM | POA: Diagnosis not present

## 2016-07-28 ENCOUNTER — Ambulatory Visit (INDEPENDENT_AMBULATORY_CARE_PROVIDER_SITE_OTHER): Payer: PPO | Admitting: Family Medicine

## 2016-07-28 ENCOUNTER — Encounter: Payer: Self-pay | Admitting: Family Medicine

## 2016-07-28 VITALS — BP 140/79 | HR 85 | Temp 97.8°F | Resp 20 | Ht 67.0 in | Wt 270.0 lb

## 2016-07-28 DIAGNOSIS — J4531 Mild persistent asthma with (acute) exacerbation: Secondary | ICD-10-CM | POA: Diagnosis not present

## 2016-07-28 MED ORDER — ALBUTEROL SULFATE HFA 108 (90 BASE) MCG/ACT IN AERS: 2.0000 | INHALATION_SPRAY | Freq: Four times a day (QID) | RESPIRATORY_TRACT | 2 refills | Status: DC | PRN

## 2016-07-28 MED ORDER — AZITHROMYCIN 250 MG PO TABS: ORAL_TABLET | ORAL | 0 refills | Status: DC

## 2016-07-28 MED ORDER — PREDNISONE 20 MG PO TABS: ORAL_TABLET | ORAL | 0 refills | Status: DC

## 2016-07-28 MED ORDER — METHYLPREDNISOLONE ACETATE 80 MG/ML IJ SUSP
80.0000 mg | Freq: Once | INTRAMUSCULAR | Status: AC
  Administered 2016-07-28: 80 mg via INTRAMUSCULAR

## 2016-07-28 NOTE — Progress Notes (Signed)
BP 140/79 (BP Location: Left Wrist, Patient Position: Sitting, Cuff Size: Normal)   Pulse 85   Temp 97.8 F (36.6 C) (Oral)   Resp 20   Ht 5' 7" (1.702 m)   Wt 270 lb (122.5 kg)   SpO2 96%   BMI 42.29 kg/m    Subjective:    Patient ID: Kenneth Fuller, male    DOB: 06/04/1945, 71 y.o.   MRN: [id number]  HPI: JYDEN KROMER is a 71 y.o. male presenting on 07/28/2016 for Nasal Congestion (x 3 days. Has taken Dayquil with no relief. ) and Cough (nonproductive cough x 3 days)   HPI Coughing and wheezing and congestion Patient has been having coughing and wheezing and congestion with been going on for the past 3 days. He's also had a nonproductive cough over the past 3 days. He has taken DayQuil but it does not seem to be helping. He says he has been a little bit short of breath mainly just wheezy. He denies any fevers or chills. He was just at a church day camp over the past couple weeks with lots of children there may have been exposed to any number of things. He says is getting worse every day over the past few days.  Relevant past medical, surgical, family and social history reviewed and updated as indicated. Interim medical history since our last visit reviewed. Allergies and medications reviewed and updated.  Review of Systems  Constitutional: Negative for chills and fever.  HENT: Positive for congestion, postnasal drip and rhinorrhea. Negative for ear discharge, ear pain, sinus pressure, sneezing, sore throat and voice change.   Eyes: Negative for pain, discharge, redness and visual disturbance.  Respiratory: Positive for cough, shortness of breath and wheezing.   Cardiovascular: Negative for chest pain and leg swelling.  Musculoskeletal: Negative for gait problem.  Skin: Negative for rash.  All other systems reviewed and are negative.   Per HPI unless specifically indicated above        Objective:    BP 140/79 (BP Location: Left Wrist, Patient Position:  Sitting, Cuff Size: Normal)   Pulse 85   Temp 97.8 F (36.6 C) (Oral)   Resp 20   Ht 5' 7" (1.702 m)   Wt 270 lb (122.5 kg)   SpO2 96%   BMI 42.29 kg/m   Wt Readings from Last 3 Encounters:  07/28/16 270 lb (122.5 kg)  06/23/16 270 lb (122.5 kg)  05/19/16 268 lb (121.6 kg)    Physical Exam  Constitutional: He is oriented to person, place, and time. He appears well-developed and well-nourished. No distress.  HENT:  Right Ear: Tympanic membrane, external ear and ear canal normal.  Left Ear: Tympanic membrane, external ear and ear canal normal.  Nose: Mucosal edema and rhinorrhea present. No sinus tenderness. No epistaxis. Right sinus exhibits maxillary sinus tenderness. Right sinus exhibits no frontal sinus tenderness. Left sinus exhibits maxillary sinus tenderness. Left sinus exhibits no frontal sinus tenderness.  Mouth/Throat: Uvula is midline and mucous membranes are normal. Posterior oropharyngeal edema and posterior oropharyngeal erythema present. No oropharyngeal exudate or tonsillar abscesses.  Eyes: Conjunctivae and EOM are normal. Pupils are equal, round, and reactive to light. Right eye exhibits no discharge. No scleral icterus.  Neck: Neck supple. No thyromegaly present.  Cardiovascular: Normal rate, regular rhythm, normal heart sounds and intact distal pulses.   No murmur heard. Pulmonary/Chest: Effort normal. No accessory muscle usage. No respiratory distress. He has decreased breath sounds in the right  middle field, the right lower field, the left middle field and the left lower field. He has wheezes in the right upper field, the right middle field, the right lower field, the left upper field, the left middle field and the left lower field. He has no rhonchi. He has no rales.  Musculoskeletal: Normal range of motion. He exhibits no edema.  Lymphadenopathy:    He has no cervical adenopathy.  Neurological: He is alert and oriented to person, place, and time. Coordination  normal.  Skin: Skin is warm and dry. No rash noted. He is not diaphoretic.  Psychiatric: He has a normal mood and affect. His behavior is normal.  Nursing note and vitals reviewed.       Assessment & Plan:   Problem List Items Addressed This Visit    None    Visit Diagnoses    Mild persistent asthma with exacerbation    -  Primary   Relevant Medications   albuterol (PROVENTIL HFA;VENTOLIN HFA) 108 (90 Base) MCG/ACT inhaler   predniSONE (DELTASONE) 20 MG tablet   azithromycin (ZITHROMAX) 250 MG tablet   methylPREDNISolone acetate (DEPO-MEDROL) injection 80 mg (Start on 07/28/2016  6:45 PM)      Instructed patient that we will try a Depo-Medrol shot and albuterol and prednisone and azithromycin, if it worsens at all he needs to go to the emergency department.  Follow up plan: Return if symptoms worsen or fail to improve.  Counseling provided for all of the vaccine components No orders of the defined types were placed in this encounter.   Caryl Pina, MD [facility] 07/28/2016, 6:34 PM

## 2016-08-04 ENCOUNTER — Encounter: Payer: Self-pay | Admitting: Family Medicine

## 2016-08-04 ENCOUNTER — Ambulatory Visit (INDEPENDENT_AMBULATORY_CARE_PROVIDER_SITE_OTHER): Payer: PPO | Admitting: Family Medicine

## 2016-08-04 ENCOUNTER — Ambulatory Visit (INDEPENDENT_AMBULATORY_CARE_PROVIDER_SITE_OTHER): Payer: PPO

## 2016-08-04 VITALS — BP 138/83 | HR 69 | Temp 97.1°F | Ht 67.0 in | Wt 267.0 lb

## 2016-08-04 DIAGNOSIS — R062 Wheezing: Secondary | ICD-10-CM

## 2016-08-04 DIAGNOSIS — J4531 Mild persistent asthma with (acute) exacerbation: Secondary | ICD-10-CM

## 2016-08-04 MED ORDER — PREDNISONE 20 MG PO TABS: ORAL_TABLET | ORAL | 0 refills | Status: DC

## 2016-08-04 MED ORDER — METHYLPREDNISOLONE ACETATE 80 MG/ML IJ SUSP
80.0000 mg | Freq: Once | INTRAMUSCULAR | Status: AC
  Administered 2016-08-04: 80 mg via INTRAMUSCULAR

## 2016-08-04 MED ORDER — CEFTRIAXONE SODIUM 1 G IJ SOLR
1.0000 g | Freq: Once | INTRAMUSCULAR | Status: AC
  Administered 2016-08-04: 1 g via INTRAMUSCULAR

## 2016-08-04 MED ORDER — LEVOFLOXACIN 500 MG PO TABS: 500.0000 mg | ORAL_TABLET | Freq: Every day | ORAL | 0 refills | Status: DC

## 2016-08-04 NOTE — Progress Notes (Signed)
BP 138/83   Pulse 69   Temp 97.1 F (36.2 C) (Oral)   Ht _0  (1.702 m)   Wt 267 lb (121.1 kg)   SpO2 95%   BMI 41.82 kg/m    Subjective:    Patient ID: Kenneth Fuller, male    DOB: 1945-06-20, 71 y.o.   MRN: [id number]  HPI: Kenneth Fuller is a 71 y.o. male presenting on 08/04/2016 for Nasal congestion, cough (has not improved since last week)   HPI Nasal congestion and cough and wheezing Patient has been having persistent nasal congestion and cough and wheezing. He did azithromycin and the course of prednisone and had a Depo-Medrol shot a week ago and he got slightly better but it is come right back and he is still having a lot of congestion and wheezing and trouble moving air in trouble sleeping at night because of how tight his chest is. 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 He does feel short of breath and tight. He denies any fevers or chills.  Relevant past medical, surgical, family and social history reviewed and updated as indicated. Interim medical history since our last visit reviewed. Allergies and medications reviewed and updated.  Review of Systems  Constitutional: Negative for chills and fever.  HENT: Positive for congestion, postnasal drip, rhinorrhea, sinus pressure, sneezing and sore throat. Negative for ear discharge, ear pain and voice change.   Eyes: Negative for pain, discharge, redness and visual disturbance.  Respiratory: Positive for cough, shortness of breath and wheezing.   Cardiovascular: Negative for chest pain and leg swelling.  Musculoskeletal: Negative for gait problem.  Skin: Negative for rash.  All other systems reviewed and are negative.   Per HPI unless specifically indicated above        Objective:    BP 138/83   Pulse 69   Temp 97.1 F (36.2 C) (Oral)   Ht _1  (1.702 m)   Wt 267 lb (121.1  kg)   SpO2 95%   BMI 41.82 kg/m   Wt Readings from Last 3 Encounters:  08/04/16 267 lb (121.1 kg)  07/28/16 270 lb (122.5 kg)  06/23/16 270 lb (122.5 kg)    Physical Exam  Constitutional: He is oriented to person, place, and time. He appears well-developed and well-nourished. No distress.  HENT:  Right Ear: Tympanic membrane, external ear and ear canal normal.  Left Ear: Tympanic membrane, external ear and ear canal normal.  Nose: Mucosal edema and rhinorrhea present. No sinus tenderness. No epistaxis. Right sinus exhibits no maxillary sinus tenderness and no frontal sinus tenderness. Left sinus exhibits no maxillary sinus tenderness and no frontal sinus tenderness.  Mouth/Throat: Uvula is midline and mucous membranes are normal. Posterior oropharyngeal edema present. No oropharyngeal exudate, posterior oropharyngeal erythema or tonsillar abscesses.  Eyes: Conjunctivae are normal. No scleral icterus.  Neck: Neck supple. No thyromegaly present.  Cardiovascular: Normal rate, regular rhythm, normal heart sounds and intact distal pulses.   No murmur heard. Pulmonary/Chest: Effort normal. No respiratory distress. He has decreased breath sounds in the right lower field and the left lower field. He has wheezes in the right upper field, the right middle field, the left upper field and the left middle field. He has no rhonchi. He has no rales.  Musculoskeletal: Normal range of motion. He exhibits no  edema.  Lymphadenopathy:    He has no cervical adenopathy.  Neurological: He is alert and oriented to person, place, and time. Coordination normal.  Skin: Skin is warm and dry. No rash noted. He is not diaphoretic.  Psychiatric: He has a normal mood and affect. His behavior is normal.  Nursing note and vitals reviewed.       Assessment & Plan:   Problem List Items Addressed This Visit    None    Visit Diagnoses    Wheezing    -  Primary   Relevant Medications   cefTRIAXone (ROCEPHIN)  injection 1 g (Start on 08/04/2016 11:00 AM)   methylPREDNISolone acetate (DEPO-MEDROL) injection 80 mg (Start on 08/04/2016 11:00 AM)   predniSONE (DELTASONE) 20 MG tablet   levofloxacin (LEVAQUIN) 500 MG tablet   Other Relevant Orders   DG Chest 2 View   Mild persistent asthma with exacerbation       Relevant Medications   cefTRIAXone (ROCEPHIN) injection 1 g (Start on 08/04/2016 11:00 AM)   methylPREDNISolone acetate (DEPO-MEDROL) injection 80 mg (Start on 08/04/2016 11:00 AM)   predniSONE (DELTASONE) 20 MG tablet   levofloxacin (LEVAQUIN) 500 MG tablet       Follow up plan: Return if symptoms worsen or fail to improve.  Counseling provided for all of the vaccine components Orders Placed This Encounter  Procedures  . DG Chest 2 View    Caryl Pina, MD [facility] 08/04/2016, 10:54 AM

## 2016-08-14 DIAGNOSIS — G4733 Obstructive sleep apnea (adult) (pediatric): Secondary | ICD-10-CM | POA: Diagnosis not present

## 2016-08-27 ENCOUNTER — Other Ambulatory Visit: Payer: Self-pay | Admitting: Family Medicine

## 2016-09-14 DIAGNOSIS — G4733 Obstructive sleep apnea (adult) (pediatric): Secondary | ICD-10-CM | POA: Diagnosis not present

## 2016-10-15 DIAGNOSIS — G4733 Obstructive sleep apnea (adult) (pediatric): Secondary | ICD-10-CM | POA: Diagnosis not present

## 2016-10-22 ENCOUNTER — Other Ambulatory Visit: Payer: Self-pay | Admitting: Family Medicine

## 2016-11-13 ENCOUNTER — Other Ambulatory Visit: Payer: Self-pay | Admitting: Family Medicine

## 2016-11-14 DIAGNOSIS — G4733 Obstructive sleep apnea (adult) (pediatric): Secondary | ICD-10-CM | POA: Diagnosis not present

## 2016-11-29 ENCOUNTER — Ambulatory Visit (INDEPENDENT_AMBULATORY_CARE_PROVIDER_SITE_OTHER): Payer: PPO | Admitting: Nurse Practitioner

## 2016-11-29 ENCOUNTER — Ambulatory Visit (INDEPENDENT_AMBULATORY_CARE_PROVIDER_SITE_OTHER): Payer: PPO

## 2016-11-29 ENCOUNTER — Encounter: Payer: Self-pay | Admitting: Nurse Practitioner

## 2016-11-29 ENCOUNTER — Other Ambulatory Visit: Payer: PPO

## 2016-11-29 VITALS — BP 140/85 | HR 76 | Temp 97.1°F | Ht 67.0 in | Wt 275.0 lb

## 2016-11-29 DIAGNOSIS — E119 Type 2 diabetes mellitus without complications: Secondary | ICD-10-CM

## 2016-11-29 DIAGNOSIS — E785 Hyperlipidemia, unspecified: Secondary | ICD-10-CM | POA: Diagnosis not present

## 2016-11-29 DIAGNOSIS — R079 Chest pain, unspecified: Secondary | ICD-10-CM | POA: Diagnosis not present

## 2016-11-29 DIAGNOSIS — I10 Essential (primary) hypertension: Secondary | ICD-10-CM | POA: Diagnosis not present

## 2016-11-29 DIAGNOSIS — Z23 Encounter for immunization: Secondary | ICD-10-CM

## 2016-11-29 DIAGNOSIS — R0602 Shortness of breath: Secondary | ICD-10-CM | POA: Diagnosis not present

## 2016-11-29 LAB — BAYER DCA HB A1C WAIVED: HB A1C: 7.4 % — AB (ref <7.0)

## 2016-11-29 NOTE — Progress Notes (Signed)
   Subjective:    Patient ID: Kenneth Fuller, male    DOB: November 25, 1945, 71 y.o.   MRN: [id number]  HPI Patient has been having SOB and intermittent chest pain for 2 weeks. Describes chest pain as heavy feeling in chest that lasts about a few seconds. The sob is constant. Says he cant do anything without out feeling like he cannot breathe. Breathing is very difficult when he lays down at night. Saw cardiology in February 2018 with dyspnea but EKG was not done and he attributed the dyspnea to his weight and OSA. Has not used CPAP in awhile. Makes him dry so he stopped using it. Rates chest pain right now a 2/10. When it gets bad he rates it a 7/10.   Review of Systems  Constitutional: Positive for fatigue.  HENT: Negative.   Respiratory: Positive for shortness of breath.   Cardiovascular: Positive for chest pain. Negative for palpitations and leg swelling.  Gastrointestinal: Negative.   Genitourinary: Negative.   Neurological: Positive for dizziness (slight).  Psychiatric/Behavioral: Negative.   All other systems reviewed and are negative.      Objective:   Physical Exam  Constitutional: He is oriented to person, place, and time. He appears well-developed and well-nourished. No distress.  Cardiovascular: Normal rate, regular rhythm and normal heart sounds.   Pulmonary/Chest: Effort normal and breath sounds normal.  Neurological: He is alert and oriented to person, place, and time.  Skin: Skin is warm.  Psychiatric: He has a normal mood and affect. His behavior is normal. Judgment and thought content normal.    BP 140/85   Pulse 76   Temp (!) 97.1 F (36.2 C) (Oral)   Ht _0  (1.702 m)   Wt 275 lb (124.7 kg)   SpO2 95%   BMI 43.07 kg/m   EKG- NSR- no changes noted from Larae Grooms, FNP Chest x ray- no changes form previous-Preliminary reading by Ronnald Collum, FNP  [facility]      Assessment & Plan:  1. Chest pain, unspecified type No strenuous activity till  see cardiology If pain develops and doe snot go away need to go to ER - DG Chest 2 View; Future - Ambulatory referral to Cardiology  2. SOB (shortness of breath) - EKG 12-Lead  Mary-Margaret Hassell Done, FNP

## 2016-11-29 NOTE — Patient Instructions (Signed)
Angina Pectoris Angina pectoris is a very bad feeling in the chest, neck, or arm. Your doctor may call it angina. There are four types of angina. Angina is caused by a lack of blood in the middle and thickest layer of the heart wall (myocardium). Angina may feel like a crushing or squeezing pain in the chest. It may feel like tightness or heavy pressure in the chest. Some people say it feels like gas, heartburn, or indigestion. Some people have symptoms other than pain. These include:  Shortness of breath.  Cold sweats.  Feeling sick to your stomach (nausea).  Feeling light-headed.  Many women have chest discomfort and some of the other symptoms. However, women often have different symptoms, such as:  Feeling tired (fatigue).  Feeling nervous for no reason.  Feeling weak for no reason.  Dizziness or fainting.  Women may have angina without any symptoms. Follow these instructions at home:  Take medicines only as told by your doctor.  Take care of other health issues as told by your doctor. These include: ? High blood pressure (hypertension). ? Diabetes.  Follow a heart-healthy diet. Your doctor can help you to choose healthy food options and make changes.  Talk to your doctor to learn more about healthy cooking methods and use them. These include: ? Roasting. ? Grilling. ? Broiling. ? Baking. ? Poaching. ? Steaming. ? Stir-frying.  Follow an exercise program approved by your doctor.  Keep a healthy weight. Lose weight as told by your doctor.  Rest when you are tired.  Learn to manage stress.  Do not use any tobacco, such as cigarettes, chewing tobacco, or electronic cigarettes. If you need help quitting, ask your doctor.  If you drink alcohol, and your doctor says it is okay, limit yourself to no more than 1 drink per day. One drink equals 12 ounces of beer, 5 ounces of wine, or 1 ounces of hard liquor.  Stop illegal drug use.  Keep all follow-up visits as told  by your doctor. This is important. Do not take these medicines unless your doctor says that you can:  Nonsteroidal anti-inflammatory drugs (NSAIDs). These include: ? Ibuprofen. ? Naproxen. ? Celecoxib.  Vitamin supplements that have vitamin A, vitamin E, or both.  Hormone therapy that contains estrogen with or without progestin.  Get help right away if:  You have pain in your chest, neck, arm, jaw, stomach, or back that: ? Lasts more than a few minutes. ? Comes back. ? Does not get better after you take medicine under your tongue (sublingual nitroglycerin).  You have any of these symptoms for no reason: ? Gas, heartburn, or indigestion. ? Sweating a lot. ? Shortness of breath or trouble breathing. ? Feeling sick to your stomach or throwing up. ? Feeling more tired than usual. ? Feeling nervous or worrying more than usual. ? Feeling weak. ? Diarrhea.  You are suddenly dizzy or light-headed.  You faint or pass out. These symptoms may be an emergency. Do not wait to see if the symptoms will go away. Get medical help right away. Call your local emergency services (911 in the [place] Do not drive yourself to the hospital. This information is not intended to replace advice given to you by your health care provider. Make sure you discuss any questions you have with your health care provider. Document Released: 07/14/2007 Document Revised: 07/03/2015 Document Reviewed: 05/29/2013 Elsevier Interactive Patient Education  2017 [employer].

## 2016-11-30 ENCOUNTER — Other Ambulatory Visit: Payer: Self-pay

## 2016-11-30 ENCOUNTER — Ambulatory Visit (INDEPENDENT_AMBULATORY_CARE_PROVIDER_SITE_OTHER): Payer: PPO | Admitting: Cardiology

## 2016-11-30 ENCOUNTER — Encounter: Payer: Self-pay | Admitting: Cardiology

## 2016-11-30 VITALS — BP 138/72 | HR 74 | Resp 16 | Ht 67.0 in | Wt 279.4 lb

## 2016-11-30 DIAGNOSIS — R079 Chest pain, unspecified: Secondary | ICD-10-CM

## 2016-11-30 DIAGNOSIS — R072 Precordial pain: Secondary | ICD-10-CM

## 2016-11-30 DIAGNOSIS — Z01812 Encounter for preprocedural laboratory examination: Secondary | ICD-10-CM

## 2016-11-30 DIAGNOSIS — M7989 Other specified soft tissue disorders: Secondary | ICD-10-CM | POA: Insufficient documentation

## 2016-11-30 DIAGNOSIS — R0602 Shortness of breath: Secondary | ICD-10-CM

## 2016-11-30 DIAGNOSIS — I10 Essential (primary) hypertension: Secondary | ICD-10-CM

## 2016-11-30 DIAGNOSIS — E118 Type 2 diabetes mellitus with unspecified complications: Secondary | ICD-10-CM

## 2016-11-30 DIAGNOSIS — E785 Hyperlipidemia, unspecified: Secondary | ICD-10-CM | POA: Diagnosis not present

## 2016-11-30 DIAGNOSIS — R600 Localized edema: Secondary | ICD-10-CM

## 2016-11-30 LAB — CMP14+EGFR
ALT: 23 IU/L (ref 0–44)
AST: 17 IU/L (ref 0–40)
Albumin/Globulin Ratio: 1.8 (ref 1.2–2.2)
Albumin: 4.3 g/dL (ref 3.5–4.8)
Alkaline Phosphatase: 70 IU/L (ref 39–117)
BUN / CREAT RATIO: 17 (ref 10–24)
BUN: 12 mg/dL (ref 8–27)
Bilirubin Total: 0.4 mg/dL (ref 0.0–1.2)
CO2: 25 mmol/L (ref 20–29)
CREATININE: 0.72 mg/dL — AB (ref 0.76–1.27)
Calcium: 9.1 mg/dL (ref 8.6–10.2)
Chloride: 96 mmol/L (ref 96–106)
GFR calc non Af Amer: 95 mL/min/{1.73_m2} (ref >59)
GFR, EST AFRICAN AMERICAN: 109 mL/min/{1.73_m2} (ref >59)
GLUCOSE: 177 mg/dL — AB (ref 65–99)
Globulin, Total: 2.4 g/dL (ref 1.5–4.5)
Potassium: 3.9 mmol/L (ref 3.5–5.2)
Sodium: 141 mmol/L (ref 134–144)
TOTAL PROTEIN: 6.7 g/dL (ref 6.0–8.5)

## 2016-11-30 LAB — LIPID PANEL
CHOLESTEROL TOTAL: 89 mg/dL — AB (ref 100–199)
Chol/HDL Ratio: 2.5 ratio (ref 0.0–5.0)
HDL: 36 mg/dL — AB (ref >39)
LDL Calculated: 30 mg/dL (ref 0–99)
Triglycerides: 113 mg/dL (ref 0–149)
VLDL Cholesterol Cal: 23 mg/dL (ref 5–40)

## 2016-11-30 NOTE — Assessment & Plan Note (Addendum)
Pt presents with 2-3 week history of exertional chest pain that radiates to his back and neck, associated with SOB, and is relieved with rest and time. Risk factors for ACS include HTN, HLD, DM, obesity, and family history. He will undergo echocardiogram and elective left heart catheterization later this week. He was started on 81 mg ASA daily.

## 2016-11-30 NOTE — Assessment & Plan Note (Signed)
He recently ran out of jardiance. I asked him to call his PCP for a refill as this is a good medication for heart disease.

## 2016-11-30 NOTE — Assessment & Plan Note (Signed)
Bilateral lower extremity swelling noted 3 weeks ago. Echocardiogram scheduled.

## 2016-11-30 NOTE — Assessment & Plan Note (Signed)
Barriers to exercise include chronic back pain from his C1 cervical fractures in 2017. Discussed weight goals.

## 2016-11-30 NOTE — Progress Notes (Signed)
11/30/2016 Kenneth Fuller   [birth year]  [id number]  Primary Physician [name], [name], MD Primary Cardiologist: Dr. Sallyanne Kuster  HPI:    [name] is a 71 yo male with a PMH significant for HTN, HLD, DM, remote accidental C1 cervical fracture, GERD, and morbid obesity. He has a family history of heart disease: father died of heart attack at age 22 and his son just had a MI at age 63 with stent placement. He has a history of heart catheterization in 2008 with nonobstructive disease and myoview stress test in 2011 that was negative for reversible ischemia.   He returns today after presenting to his PCP yesterday with a 2-3 week history of exertional chest pain and shortness of breath. He states that he can no longer walk to the mailbox or shower without chest pain and shortness of breath. The chest pain is located substernally and is described as similar to heart burn, but it lasts longer. He states it feels sharp at first, but transitions to a dull pain and lasts for 15-20 min. It is relieved with time and rest. It is associated with SOB, but he denies palpitations, diaphoresis, nausea, and vomiting. He also states he has new lower extremity swelling that started about 3 weeks ago.    Current Outpatient Prescriptions  Medication Sig Dispense Refill  . albuterol (PROVENTIL HFA;VENTOLIN HFA) 108 (90 Base) MCG/ACT inhaler Inhale 2 puffs into the lungs every 6 (six) hours as needed for wheezing or shortness of breath. 1 Inhaler 2  . amLODipine (NORVASC) 10 MG tablet TAKE ONE-HALF TABLET BY MOUTH ONCE DAILY 45 tablet 0  . atorvastatin (LIPITOR) 40 MG tablet TAKE 1 TABLET BY MOUTH ONCE DAILY 90 tablet 0  . Blood Glucose Monitoring Suppl (PRODIGY BLOOD GLUCOSE MONITOR) W/DEVICE KIT 1 Units by Does not apply route daily. Check blood sugars 1X per day-- dx 250.02 1 each 0  . Cholecalciferol (VITAMIN D PO) Take 1 tablet by mouth every morning.    . colchicine 0.6 MG tablet Take 1 tablet (0.6  mg total) by mouth daily. 30 tablet 3  . empagliflozin (JARDIANCE) 10 MG TABS tablet Take 10 mg by mouth daily. 28 tablet 0  . fluticasone (FLONASE) 50 MCG/ACT nasal spray Place 2 sprays into both nostrils daily. 16 g 6  . Fluticasone-Salmeterol (ADVAIR) 100-50 MCG/DOSE AEPB Inhale 1 puff into the lungs 2 (two) times daily. (Patient taking differently: Inhale 1 puff into the lungs 2 (two) times daily as needed. ) 1 each 2  . furosemide (LASIX) 40 MG tablet TAKE 1 BY MOUTH TWICE DAILY (Patient taking differently: Take 40 mg by mouth daily. ) 180 tablet 0  . glucose blood test strip Test 1X per day and prn- Dx Code E11.9. One Touch Ultra Test Strips 100 each 2  . KLOR-CON M10 10 MEQ tablet TAKE 1 TABLET BY MOUTH ONCE DAILY 90 tablet 1  . metFORMIN (GLUCOPHAGE) 1000 MG tablet TAKE 1 TABLET BY MOUTH TWICE DAILY WITH  A  MEAL 180 tablet 0  . metoprolol (LOPRESSOR) 50 MG tablet Take 50 mg by mouth 2 (two) times daily.    . metoprolol tartrate (LOPRESSOR) 50 MG tablet TAKE TWO TABLETS BY MOUTH IN THE MORNING AND ONE AT BEDTIME 270 tablet 1  . omeprazole (PRILOSEC) 40 MG capsule TAKE 1 CAPSULE BY MOUTH ONCE DAILY 90 capsule 0  . ONETOUCH DELICA LANCETS 28N MISC 1 each by Does not apply route daily. Test 1X per day and prn -  DX 250.02 100 each 11  . tizanidine (ZANAFLEX) 2 MG capsule Take 1 capsule (2 mg total) by mouth 3 (three) times daily. (Patient taking differently: Take 2 mg by mouth 3 (three) times daily as needed. ) 30 capsule 1  . traMADol (ULTRAM) 50 MG tablet Take 1 tablet (50 mg total) by mouth every 8 (eight) hours as needed. 40 tablet 0   No current facility-administered medications for this visit.     Allergies  Allergen Reactions  . Amlodipine Besy-Benazepril Hcl     Makes tongue swell  . Phenergan [Promethazine Hcl]     "I can't remember."  . Hydrocodone Itching    Can tolerate in low doses    Past Medical History:  Diagnosis Date  . Allergy   . Asthma   . CAD (coronary artery  disease)   . Diabetes mellitus   . Fibromyalgia   . GERD (gastroesophageal reflux disease)   . GI bleeding   . Gout   . Hyperlipidemia   . Hypertension   . Hypogonadism male   . MRSA cellulitis   . Neuropathy   . Obesity   . Shortness of breath dyspnea    with exertion   . Sleep apnea    cpap- 14   . Wheezing    no asthma diagnosis    Social History   Social History  . Marital status: Married    Spouse name: N/A  . Number of children: 3  . Years of education: N/A   Occupational History  . retired Disabled   Social History Main Topics  . Smoking status: Former Smoker    Quit date: 02/08/1969  . Smokeless tobacco: Former User  . Alcohol use No  . Drug use: No  . Sexual activity: Not on file   Other Topics Concern  . Not on file   Social History Narrative   Drinks caffeine tea occasionally      Family History  Problem Relation Age of Onset  . Colon cancer Mother   . Diabetes Father        siblings  . Heart disease Father        brother  . Kidney disease Sister   . Colon polyps Neg Hx      Review of Systems: General: negative for chills, fever, night sweats or weight changes.  Cardiovascular: + for chest pain, + dyspnea on exertion, + edema, no orthopnea, palpitations, paroxysmal nocturnal dyspnea, + shortness of breath Dermatological: negative for rash Respiratory: + cough, no wheezing Urologic: negative for hematuria Abdominal: negative for nausea, vomiting, diarrhea, bright red blood per rectum, melena, or hematemesis Neurologic: negative for visual changes, syncope, or dizziness All other systems reviewed and are otherwise negative except as noted above.    Blood pressure 138/72, pulse 74, resp. rate 16, height 5' 7" (1.702 m), weight 279 lb 6.4 oz (126.7 kg), SpO2 96 %.  General appearance: alert, cooperative and no distress Neck: no carotid bruit, no JVD and supple, symmetrical, trachea midline Lungs: clear to auscultation bilaterally Heart:  regular rate and rhythm, S1, S2 normal, no murmur, click, rub or gallop Abdomen: soft, non-tender; bowel sounds normal; no masses,  no organomegaly Extremities: edema trace to 1+ bilateral lower extermities, pulses intact Pulses: 2+ and symmetric Skin: Skin color, texture, turgor normal. No rashes or lesions  EKG NSR  ASSESSMENT AND PLAN:   Chest pain with moderate risk for cardiac etiology Pt presents with 2-3 week history of exertional chest pain that radiates   to his back and neck, associated with SOB, and is relieved with rest and time. Risk factors for ACS include HTN, HLD, DM, obesity, and family history. He will undergo echocardiogram and elective left heart catheterization later this week. He was started on 81 mg ASA daily.  Diabetes He recently ran out of jardiance. I asked him to call his PCP for a refill as this is a good medication for heart disease.  Essential hypertension No medication changes today  Hyperlipidemia with target LDL less than 100 Continue current regimen. LDL below 50.  Swelling of lower extremity Bilateral lower extremity swelling noted 3 weeks ago. Echocardiogram scheduled.  Morbid obesity ([place]) Barriers to exercise include chronic back pain from his C1 cervical fractures in 2017. Discussed weight goals.   PLAN    Patient was seen by Dr. Gwenlyn Found and myself. He will be scheduled for echocardiogram tomorrow in the [facility] [place] and is scheduled for [facility] by Dr. Gwenlyn Found on Thursday, 12/02/16. He was started on 81 mg ASA.  The patient understands that risks included but are not limited to stroke (1 in 1000), death (1 in 10), kidney failure [usually temporary] (1 in 500), bleeding (1 in 200), allergic reaction [possibly serious] (1 in 200).  The patient understands and agrees to proceed.    Kerin Ransom PA-C 11/30/2016 11:44 AM

## 2016-11-30 NOTE — Assessment & Plan Note (Signed)
No medication changes today.

## 2016-11-30 NOTE — Progress Notes (Signed)
Sounds appropriate to cath! Thanks, [employer]

## 2016-11-30 NOTE — Assessment & Plan Note (Signed)
Continue current regimen. LDL below 50.

## 2016-11-30 NOTE — Patient Instructions (Addendum)
Your physician has recommended you make the following change in your medication: START Aspirin 81 mg daily  Your physician has requested that you have an echocardiogram TOMORROW. Echocardiography is a painless test that uses sound waves to create images of your heart. It provides your doctor with information about the size and shape of your heart and how well your heart's chambers and valves are working. This procedure takes approximately one hour. There are no restrictions for this procedure.    [place] [address] [place] [address] Dept: [phone] Loc: [phone]  Kenneth Fuller  11/30/2016  You are scheduled for a Cardiac Catheterization on Thursday, October 25 with Dr. Quay Burow.  1. Please arrive at the [facility] (Main Entrance A) at [facility]: [address] [place], [place] [address] at 11:30 AM (two hours before your procedure to ensure your preparation). Free valet parking service is available.   Special note: Every effort is made to have your procedure done on time. Please understand that emergencies sometimes delay scheduled procedures.  2. Diet: Do not eat or drink anything after midnight prior to your procedure except sips of water to take medications.  3. Labs: You will need to have blood drawn TODAY.  4. Medication instructions in preparation for your procedure:  Stop taking, Glucophage (Metformin) on Wednesday, October 24.  Restart metformin 2 days after the procedure.  HOLD Jardiance Thursday morning.  On the morning of your procedure, take your Aspirin 81 mg and any morning medicines NOT listed above.  You may use sips of water.  5. Plan for one night stay--bring personal belongings.  6. Bring a current list of your medications and current insurance cards.  7. You MUST have a responsible person to drive you home.  8. Someone MUST be with  you the first 24 hours after you arrive home or your discharge will be delayed.  9. Please wear clothes that are easy to get on and off and wear slip-on shoes.  Thank you for allowing [place] to care for you!   -- [facility]

## 2016-12-01 ENCOUNTER — Ambulatory Visit (HOSPITAL_COMMUNITY)
Admission: RE | Admit: 2016-12-01 | Discharge: 2016-12-01 | Disposition: A | Discharge status: Home / Self Care | Payer: PPO | Source: Ambulatory Visit | Attending: Cardiology | Admitting: Cardiology

## 2016-12-01 ENCOUNTER — Telehealth: Payer: Self-pay

## 2016-12-01 ENCOUNTER — Ambulatory Visit: Payer: PPO | Admitting: Family Medicine

## 2016-12-01 DIAGNOSIS — I2510 Atherosclerotic heart disease of native coronary artery without angina pectoris: Secondary | ICD-10-CM | POA: Insufficient documentation

## 2016-12-01 DIAGNOSIS — E785 Hyperlipidemia, unspecified: Secondary | ICD-10-CM | POA: Insufficient documentation

## 2016-12-01 DIAGNOSIS — R600 Localized edema: Secondary | ICD-10-CM | POA: Insufficient documentation

## 2016-12-01 DIAGNOSIS — Z87891 Personal history of nicotine dependence: Secondary | ICD-10-CM | POA: Diagnosis not present

## 2016-12-01 DIAGNOSIS — E119 Type 2 diabetes mellitus without complications: Secondary | ICD-10-CM | POA: Diagnosis not present

## 2016-12-01 DIAGNOSIS — I10 Essential (primary) hypertension: Secondary | ICD-10-CM | POA: Insufficient documentation

## 2016-12-01 DIAGNOSIS — R072 Precordial pain: Secondary | ICD-10-CM | POA: Insufficient documentation

## 2016-12-01 DIAGNOSIS — R0602 Shortness of breath: Secondary | ICD-10-CM | POA: Diagnosis not present

## 2016-12-01 LAB — CBC
Hematocrit: 42 % (ref 37.5–51.0)
Hemoglobin: 13.7 g/dL (ref 13.0–17.7)
MCH: 28.3 pg (ref 26.6–33.0)
MCHC: 32.6 g/dL (ref 31.5–35.7)
MCV: 87 fL (ref 79–97)
Platelets: 285 10*3/uL (ref 150–379)
RBC: 4.84 x10E6/uL (ref 4.14–5.80)
RDW: 15.8 % — ABNORMAL HIGH (ref 12.3–15.4)
WBC: 9.2 10*3/uL (ref 3.4–10.8)

## 2016-12-01 LAB — BASIC METABOLIC PANEL
BUN/Creatinine Ratio: 17 (ref 10–24)
BUN: 13 mg/dL (ref 8–27)
CO2: 24 mmol/L (ref 20–29)
Calcium: 10.1 mg/dL (ref 8.6–10.2)
Chloride: 97 mmol/L (ref 96–106)
Creatinine, Ser: 0.76 mg/dL (ref 0.76–1.27)
GFR calc Af Amer: 107 mL/min/{1.73_m2} (ref >59)
GFR calc non Af Amer: 92 mL/min/{1.73_m2} (ref >59)
Glucose: 137 mg/dL — ABNORMAL HIGH (ref 65–99)
Potassium: 4.5 mmol/L (ref 3.5–5.2)
Sodium: 141 mmol/L (ref 134–144)

## 2016-12-01 LAB — PROTIME-INR
INR: 1 (ref 0.8–1.2)
Prothrombin Time: 10.6 s (ref 9.1–12.0)

## 2016-12-01 MED ORDER — PERFLUTREN LIPID MICROSPHERE
1.0000 mL | INTRAVENOUS | Status: AC | PRN
  Administered 2016-12-01: 2 mL via INTRAVENOUS
  Filled 2016-12-01: qty 10

## 2016-12-01 NOTE — Progress Notes (Signed)
  Echocardiogram 2D Echocardiogram has been performed.  Jennette Dubin 12/01/2016, 11:52 AM

## 2016-12-01 NOTE — Telephone Encounter (Signed)
Patient contacted pre-catheterization at [facility] scheduled for:  12/02/2016 @ 1330 Verified arrival time and place:  NT @ 1130 Confirmed AM meds to be taken pre-cath with sip of water: Take ASA Hold lasix, jardiance morning of Hold metformin-last dose tues pm Confirmed patient has responsible person to drive home post procedure and observe patient for 24 hours:  yes Addl concerns:  none

## 2016-12-02 ENCOUNTER — Ambulatory Visit (HOSPITAL_COMMUNITY)
Admission: RE | Admit: 2016-12-02 | Discharge: 2016-12-02 | Disposition: A | Discharge status: Home / Self Care | Payer: PPO | Source: Ambulatory Visit | Attending: Cardiovascular Disease | Admitting: Cardiovascular Disease

## 2016-12-02 ENCOUNTER — Encounter (HOSPITAL_COMMUNITY)
Admission: RE | Disposition: A | Discharge status: Home / Self Care | Payer: Self-pay | Source: Ambulatory Visit | Attending: Cardiovascular Disease

## 2016-12-02 DIAGNOSIS — M797 Fibromyalgia: Secondary | ICD-10-CM | POA: Insufficient documentation

## 2016-12-02 DIAGNOSIS — E8770 Fluid overload, unspecified: Secondary | ICD-10-CM | POA: Insufficient documentation

## 2016-12-02 DIAGNOSIS — Z9989 Dependence on other enabling machines and devices: Secondary | ICD-10-CM | POA: Insufficient documentation

## 2016-12-02 DIAGNOSIS — E291 Testicular hypofunction: Secondary | ICD-10-CM | POA: Insufficient documentation

## 2016-12-02 DIAGNOSIS — I10 Essential (primary) hypertension: Secondary | ICD-10-CM | POA: Insufficient documentation

## 2016-12-02 DIAGNOSIS — Z87891 Personal history of nicotine dependence: Secondary | ICD-10-CM | POA: Diagnosis not present

## 2016-12-02 DIAGNOSIS — E785 Hyperlipidemia, unspecified: Secondary | ICD-10-CM | POA: Diagnosis not present

## 2016-12-02 DIAGNOSIS — R079 Chest pain, unspecified: Secondary | ICD-10-CM | POA: Diagnosis not present

## 2016-12-02 DIAGNOSIS — Z8614 Personal history of Methicillin resistant Staphylococcus aureus infection: Secondary | ICD-10-CM | POA: Diagnosis not present

## 2016-12-02 DIAGNOSIS — K219 Gastro-esophageal reflux disease without esophagitis: Secondary | ICD-10-CM | POA: Diagnosis not present

## 2016-12-02 DIAGNOSIS — M109 Gout, unspecified: Secondary | ICD-10-CM | POA: Diagnosis not present

## 2016-12-02 DIAGNOSIS — Z8249 Family history of ischemic heart disease and other diseases of the circulatory system: Secondary | ICD-10-CM | POA: Diagnosis not present

## 2016-12-02 DIAGNOSIS — Z6841 Body Mass Index (BMI) 40.0 and over, adult: Secondary | ICD-10-CM | POA: Insufficient documentation

## 2016-12-02 DIAGNOSIS — I2510 Atherosclerotic heart disease of native coronary artery without angina pectoris: Secondary | ICD-10-CM | POA: Diagnosis not present

## 2016-12-02 DIAGNOSIS — E119 Type 2 diabetes mellitus without complications: Secondary | ICD-10-CM | POA: Insufficient documentation

## 2016-12-02 DIAGNOSIS — Z79899 Other long term (current) drug therapy: Secondary | ICD-10-CM | POA: Diagnosis not present

## 2016-12-02 DIAGNOSIS — R600 Localized edema: Secondary | ICD-10-CM | POA: Insufficient documentation

## 2016-12-02 DIAGNOSIS — Z888 Allergy status to other drugs, medicaments and biological substances status: Secondary | ICD-10-CM | POA: Diagnosis not present

## 2016-12-02 DIAGNOSIS — Z7984 Long term (current) use of oral hypoglycemic drugs: Secondary | ICD-10-CM | POA: Insufficient documentation

## 2016-12-02 DIAGNOSIS — G4730 Sleep apnea, unspecified: Secondary | ICD-10-CM | POA: Diagnosis not present

## 2016-12-02 DIAGNOSIS — Z885 Allergy status to narcotic agent status: Secondary | ICD-10-CM | POA: Diagnosis not present

## 2016-12-02 DIAGNOSIS — E1140 Type 2 diabetes mellitus with diabetic neuropathy, unspecified: Secondary | ICD-10-CM | POA: Insufficient documentation

## 2016-12-02 HISTORY — PX: LEFT HEART CATH AND CORONARY ANGIOGRAPHY: CATH118249

## 2016-12-02 LAB — GLUCOSE, CAPILLARY: Glucose-Capillary: 191 mg/dL — ABNORMAL HIGH (ref 65–99)

## 2016-12-02 SURGERY — LEFT HEART CATH AND CORONARY ANGIOGRAPHY
Anesthesia: LOCAL

## 2016-12-02 MED ORDER — MIDAZOLAM HCL 2 MG/2ML IJ SOLN
INTRAMUSCULAR | Status: AC
  Filled 2016-12-02: qty 2

## 2016-12-02 MED ORDER — HEPARIN SODIUM (PORCINE) 1000 UNIT/ML IJ SOLN
INTRAMUSCULAR | Status: AC
  Filled 2016-12-02: qty 1

## 2016-12-02 MED ORDER — LIDOCAINE HCL 2 % IJ SOLN
INTRAMUSCULAR | Status: DC | PRN
  Administered 2016-12-02: 2 mL

## 2016-12-02 MED ORDER — IOPAMIDOL (ISOVUE-370) INJECTION 76%
INTRAVENOUS | Status: AC
  Filled 2016-12-02: qty 100

## 2016-12-02 MED ORDER — ASPIRIN 81 MG PO CHEW
CHEWABLE_TABLET | ORAL | Status: AC
  Administered 2016-12-02: 81 mg via ORAL
  Filled 2016-12-02: qty 1

## 2016-12-02 MED ORDER — SODIUM CHLORIDE 0.9 % IV SOLN
INTRAVENOUS | Status: DC
  Administered 2016-12-02: 13:00:00 via INTRAVENOUS

## 2016-12-02 MED ORDER — VERAPAMIL HCL 2.5 MG/ML IV SOLN
INTRAVENOUS | Status: DC | PRN
  Administered 2016-12-02: 10 mL via INTRA_ARTERIAL

## 2016-12-02 MED ORDER — SODIUM CHLORIDE 0.9% FLUSH: 3.0000 mL | INTRAVENOUS | Status: DC | PRN

## 2016-12-02 MED ORDER — HEPARIN (PORCINE) IN NACL 2-0.9 UNIT/ML-% IJ SOLN
INTRAMUSCULAR | Status: AC
  Filled 2016-12-02: qty 1000

## 2016-12-02 MED ORDER — HEPARIN SODIUM (PORCINE) 1000 UNIT/ML IJ SOLN
INTRAMUSCULAR | Status: DC | PRN
Start: 2016-12-02 — End: 2016-12-02
  Administered 2016-12-02: 6000 [IU] via INTRAVENOUS

## 2016-12-02 MED ORDER — ASPIRIN 81 MG PO CHEW
81.0000 mg | CHEWABLE_TABLET | ORAL | Status: AC
  Administered 2016-12-02: 81 mg via ORAL

## 2016-12-02 MED ORDER — FENTANYL CITRATE (PF) 100 MCG/2ML IJ SOLN
INTRAMUSCULAR | Status: DC | PRN
  Administered 2016-12-02 (×2): 25 ug via INTRAVENOUS

## 2016-12-02 MED ORDER — IOPAMIDOL (ISOVUE-370) INJECTION 76%
INTRAVENOUS | Status: DC | PRN
Start: 2016-12-02 — End: 2016-12-02
  Administered 2016-12-02: 45 mL via INTRA_ARTERIAL

## 2016-12-02 MED ORDER — FENTANYL CITRATE (PF) 100 MCG/2ML IJ SOLN
INTRAMUSCULAR | Status: AC
  Filled 2016-12-02: qty 2

## 2016-12-02 MED ORDER — HEPARIN (PORCINE) IN NACL 2-0.9 UNIT/ML-% IJ SOLN
INTRAMUSCULAR | Status: AC | PRN
  Administered 2016-12-02: 1500 mL

## 2016-12-02 MED ORDER — LIDOCAINE HCL 2 % IJ SOLN
INTRAMUSCULAR | Status: AC
  Filled 2016-12-02: qty 20

## 2016-12-02 MED ORDER — VERAPAMIL HCL 2.5 MG/ML IV SOLN
INTRAVENOUS | Status: AC
  Filled 2016-12-02: qty 2

## 2016-12-02 MED ORDER — MIDAZOLAM HCL 2 MG/2ML IJ SOLN
INTRAMUSCULAR | Status: DC | PRN
  Administered 2016-12-02: 2 mg via INTRAVENOUS
  Administered 2016-12-02: 1 mg via INTRAVENOUS

## 2016-12-02 SURGICAL SUPPLY — 10 items
CATH INFINITI 5 FR JL3.5 (CATHETERS) ×2 IMPLANT
CATH INFINITI JR4 5F (CATHETERS) ×2 IMPLANT
DEVICE RAD COMP TR BAND LRG (VASCULAR PRODUCTS) ×2 IMPLANT
GLIDESHEATH SLEND SS 6F .021 (SHEATH) ×2 IMPLANT
GUIDEWIRE INQWIRE 1.5J.035X260 (WIRE) ×1 IMPLANT
INQWIRE 1.5J .035X260CM (WIRE) ×2
KIT HEART LEFT (KITS) ×2 IMPLANT
PACK CARDIAC CATHETERIZATION (CUSTOM PROCEDURE TRAY) ×2 IMPLANT
TRANSDUCER W/STOPCOCK (MISCELLANEOUS) ×2 IMPLANT
TUBING CIL FLEX 10 FLL-RA (TUBING) ×2 IMPLANT

## 2016-12-02 NOTE — Interval H&P Note (Signed)
Cath Lab Visit (complete for each Cath Lab visit)  Clinical Evaluation Leading to the Procedure:   ACS: No.  Non-ACS:    Anginal Classification: CCS III  Anti-ischemic medical therapy: No Therapy  Non-Invasive Test Results: No non-invasive testing performed  Prior CABG: No previous CABG      History and Physical Interval Note:  12/02/2016 2:12 PM  Kenneth Fuller  has presented today for surgery, with the diagnosis of cp  The various methods of treatment have been discussed with the patient and family. After consideration of risks, benefits and other options for treatment, the patient has consented to  Procedure(s): LEFT HEART CATH AND CORONARY ANGIOGRAPHY (N/A) as a surgical intervention .  The patient's history has been reviewed, patient examined, no change in status, stable for surgery.  I have reviewed the patient's chart and labs.  Questions were answered to the patient's satisfaction.     Larae Grooms

## 2016-12-02 NOTE — Discharge Instructions (Signed)
**Note -Identified via Obfuscation** Radial Site Care  DO NOT RESTART METFORMIN UNTIL Sunday 12/05/2016.   Refer to this sheet in the next few weeks. These instructions provide you with information about caring for yourself after your procedure. Your health care provider may also give you more specific instructions. Your treatment has been planned according to current medical practices, but problems sometimes occur. Call your health care provider if you have any problems or questions after your procedure. What can I expect after the procedure? After your procedure, it is typical to have the following:  Bruising at the radial site that usually fades within 1-2 weeks.  Blood collecting in the tissue (hematoma) that may be painful to the touch. It should usually decrease in size and tenderness within 1-2 weeks.  Follow these instructions at home:  Take medicines only as directed by your health care provider.  You may shower 24-48 hours after the procedure or as directed by your health care provider. Remove the bandage (dressing) and gently wash the site with plain soap and water. Pat the area dry with a clean towel. Do not rub the site, because this may cause bleeding.  Do not take baths, swim, or use a hot tub until your health care provider approves.  Check your insertion site every day for redness, swelling, or drainage.  Do not apply powder or lotion to the site.  Do not flex or bend the affected arm for 24 hours or as directed by your health care provider.  Do not push or pull heavy objects with the affected arm for 24 hours or as directed by your health care provider.  Do not lift over 10 lb (4.5 kg) for 5 days after your procedure or as directed by your health care provider.  Ask your health care provider when it is okay to: ? Return to work or school. ? Resume usual physical activities or sports. ? Resume sexual activity.  Do not drive home if you are discharged the same day as the procedure. Have someone else  drive you.  You may drive 24 hours after the procedure unless otherwise instructed by your health care provider.  Do not operate machinery or power tools for 24 hours after the procedure.  If your procedure was done as an outpatient procedure, which means that you went home the same day as your procedure, a responsible adult should be with you for the first 24 hours after you arrive home.  Keep all follow-up visits as directed by your health care provider. This is important. Contact a health care provider if:  You have a fever.  You have chills.  You have increased bleeding from the radial site. Hold pressure on the site. Get help right away if:  You have unusual pain at the radial site.  You have redness, warmth, or swelling at the radial site.  You have drainage (other than a small amount of blood on the dressing) from the radial site.  The radial site is bleeding, and the bleeding does not stop after 30 minutes of holding steady pressure on the site.  Your arm or hand becomes pale, cool, tingly, or numb. This information is not intended to replace advice given to you by your health care provider. Make sure you discuss any questions you have with your health care provider. Document Released: 02/27/2010 Document Revised: 07/03/2015 Document Reviewed: 08/13/2013 Elsevier Interactive Patient Education  2018 [employer].

## 2016-12-02 NOTE — H&P (View-Only) (Signed)
11/30/2016 Kenneth Fuller   [birth year]  [id number]  Primary Physician [name], [name], MD Primary Cardiologist: Dr. Sallyanne Kuster  HPI:    [name] is a 71 yo male with a PMH significant for HTN, HLD, DM, remote accidental C1 cervical fracture, GERD, and morbid obesity. He has a family history of heart disease: father died of heart attack at age 22 and his son just had a MI at age 63 with stent placement. He has a history of heart catheterization in 2008 with nonobstructive disease and myoview stress test in 2011 that was negative for reversible ischemia.   He returns today after presenting to his PCP yesterday with a 2-3 week history of exertional chest pain and shortness of breath. He states that he can no longer walk to the mailbox or shower without chest pain and shortness of breath. The chest pain is located substernally and is described as similar to heart burn, but it lasts longer. He states it feels sharp at first, but transitions to a dull pain and lasts for 15-20 min. It is relieved with time and rest. It is associated with SOB, but he denies palpitations, diaphoresis, nausea, and vomiting. He also states he has new lower extremity swelling that started about 3 weeks ago.    Current Outpatient Prescriptions  Medication Sig Dispense Refill  . albuterol (PROVENTIL HFA;VENTOLIN HFA) 108 (90 Base) MCG/ACT inhaler Inhale 2 puffs into the lungs every 6 (six) hours as needed for wheezing or shortness of breath. 1 Inhaler 2  . amLODipine (NORVASC) 10 MG tablet TAKE ONE-HALF TABLET BY MOUTH ONCE DAILY 45 tablet 0  . atorvastatin (LIPITOR) 40 MG tablet TAKE 1 TABLET BY MOUTH ONCE DAILY 90 tablet 0  . Blood Glucose Monitoring Suppl (PRODIGY BLOOD GLUCOSE MONITOR) W/DEVICE KIT 1 Units by Does not apply route daily. Check blood sugars 1X per day-- dx 250.02 1 each 0  . Cholecalciferol (VITAMIN D PO) Take 1 tablet by mouth every morning.    . colchicine 0.6 MG tablet Take 1 tablet (0.6  mg total) by mouth daily. 30 tablet 3  . empagliflozin (JARDIANCE) 10 MG TABS tablet Take 10 mg by mouth daily. 28 tablet 0  . fluticasone (FLONASE) 50 MCG/ACT nasal spray Place 2 sprays into both nostrils daily. 16 g 6  . Fluticasone-Salmeterol (ADVAIR) 100-50 MCG/DOSE AEPB Inhale 1 puff into the lungs 2 (two) times daily. (Patient taking differently: Inhale 1 puff into the lungs 2 (two) times daily as needed. ) 1 each 2  . furosemide (LASIX) 40 MG tablet TAKE 1 BY MOUTH TWICE DAILY (Patient taking differently: Take 40 mg by mouth daily. ) 180 tablet 0  . glucose blood test strip Test 1X per day and prn- Dx Code E11.9. One Touch Ultra Test Strips 100 each 2  . KLOR-CON M10 10 MEQ tablet TAKE 1 TABLET BY MOUTH ONCE DAILY 90 tablet 1  . metFORMIN (GLUCOPHAGE) 1000 MG tablet TAKE 1 TABLET BY MOUTH TWICE DAILY WITH  A  MEAL 180 tablet 0  . metoprolol (LOPRESSOR) 50 MG tablet Take 50 mg by mouth 2 (two) times daily.    . metoprolol tartrate (LOPRESSOR) 50 MG tablet TAKE TWO TABLETS BY MOUTH IN THE MORNING AND ONE AT BEDTIME 270 tablet 1  . omeprazole (PRILOSEC) 40 MG capsule TAKE 1 CAPSULE BY MOUTH ONCE DAILY 90 capsule 0  . ONETOUCH DELICA LANCETS 28N MISC 1 each by Does not apply route daily. Test 1X per day and prn -  DX 250.02 100 each 11  . tizanidine (ZANAFLEX) 2 MG capsule Take 1 capsule (2 mg total) by mouth 3 (three) times daily. (Patient taking differently: Take 2 mg by mouth 3 (three) times daily as needed. ) 30 capsule 1  . traMADol (ULTRAM) 50 MG tablet Take 1 tablet (50 mg total) by mouth every 8 (eight) hours as needed. 40 tablet 0   No current facility-administered medications for this visit.     Allergies  Allergen Reactions  . Amlodipine Besy-Benazepril Hcl     Makes tongue swell  . Phenergan [Promethazine Hcl]     "I can't remember."  . Hydrocodone Itching    Can tolerate in low doses    Past Medical History:  Diagnosis Date  . Allergy   . Asthma   . CAD (coronary artery  disease)   . Diabetes mellitus   . Fibromyalgia   . GERD (gastroesophageal reflux disease)   . GI bleeding   . Gout   . Hyperlipidemia   . Hypertension   . Hypogonadism male   . MRSA cellulitis   . Neuropathy   . Obesity   . Shortness of breath dyspnea    with exertion   . Sleep apnea    cpap- 15   . Wheezing    no asthma diagnosis    Social History   Social History  . Marital status: Married    Spouse name: N/A  . Number of children: 3  . Years of education: N/A   Occupational History  . retired Disabled   Social History Main Topics  . Smoking status: Former Smoker    Quit date: 02/08/1969  . Smokeless tobacco: Former Systems developer  . Alcohol use No  . Drug use: No  . Sexual activity: Not on file   Other Topics Concern  . Not on file   Social History Narrative   Drinks caffeine tea occasionally      Family History  Problem Relation Age of Onset  . Colon cancer Mother   . Diabetes Father        siblings  . Heart disease Father        brother  . Kidney disease Sister   . Colon polyps Neg Hx      Review of Systems: General: negative for chills, fever, night sweats or weight changes.  Cardiovascular: + for chest pain, + dyspnea on exertion, + edema, no orthopnea, palpitations, paroxysmal nocturnal dyspnea, + shortness of breath Dermatological: negative for rash Respiratory: + cough, no wheezing Urologic: negative for hematuria Abdominal: negative for nausea, vomiting, diarrhea, bright red blood per rectum, melena, or hematemesis Neurologic: negative for visual changes, syncope, or dizziness All other systems reviewed and are otherwise negative except as noted above.    Blood pressure 138/72, pulse 74, resp. rate 16, height _0  (1.702 m), weight 279 lb 6.4 oz (126.7 kg), SpO2 96 %.  General appearance: alert, cooperative and no distress Neck: no carotid bruit, no JVD and supple, symmetrical, trachea midline Lungs: clear to auscultation bilaterally Heart:  regular rate and rhythm, S1, S2 normal, no murmur, click, rub or gallop Abdomen: soft, non-tender; bowel sounds normal; no masses,  no organomegaly Extremities: edema trace to 1+ bilateral lower extermities, pulses intact Pulses: 2+ and symmetric Skin: Skin color, texture, turgor normal. No rashes or lesions  EKG NSR  ASSESSMENT AND PLAN:   Chest pain with moderate risk for cardiac etiology Pt presents with 2-3 week history of exertional chest pain that radiates  to his back and neck, associated with SOB, and is relieved with rest and time. Risk factors for ACS include HTN, HLD, DM, obesity, and family history. He will undergo echocardiogram and elective left heart catheterization later this week. He was started on 81 mg ASA daily.  Diabetes He recently ran out of jardiance. I asked him to call his PCP for a refill as this is a good medication for heart disease.  Essential hypertension No medication changes today  Hyperlipidemia with target LDL less than 100 Continue current regimen. LDL below 50.  Swelling of lower extremity Bilateral lower extremity swelling noted 3 weeks ago. Echocardiogram scheduled.  Morbid obesity ([place]) Barriers to exercise include chronic back pain from his C1 cervical fractures in 2017. Discussed weight goals.   PLAN    Patient was seen by Dr. Gwenlyn Found and myself. He will be scheduled for echocardiogram tomorrow in the [facility] [place] office and is scheduled for [facility] by Dr. Gwenlyn Found on Thursday, 12/02/16. He was started on 81 mg ASA.  The patient understands that risks included but are not limited to stroke (1 in 1000), death (1 in 10), kidney failure [usually temporary] (1 in 500), bleeding (1 in 200), allergic reaction [possibly serious] (1 in 200).  The patient understands and agrees to proceed.    Kerin Ransom PA-C 11/30/2016 11:44 AM

## 2016-12-03 ENCOUNTER — Encounter (HOSPITAL_COMMUNITY): Payer: Self-pay | Admitting: Interventional Cardiology

## 2016-12-03 ENCOUNTER — Telehealth: Payer: Self-pay | Admitting: Cardiovascular Disease

## 2016-12-03 NOTE — Telephone Encounter (Signed)
Spoke to patient.   Reviewed  D/c from 12/02/16- no appointment schedule. Informed patient -- appointment schedule at 12/13/16 1:40 pm for him  discuss with DR C. About next plan. Patient verbalized understanding.   patient primary is cardiology DR C NOT DR Gwenlyn Found

## 2016-12-03 NOTE — Telephone Encounter (Signed)
Pt had Cath on yesterday,he wants to know what are Dr Gwenlyn Found future plans please?

## 2016-12-10 ENCOUNTER — Ambulatory Visit: Payer: PPO | Admitting: Cardiology

## 2016-12-13 ENCOUNTER — Ambulatory Visit: Payer: PPO | Admitting: Cardiovascular Disease

## 2016-12-13 VITALS — BP 128/74 | HR 73 | Ht 67.0 in | Wt 278.0 lb

## 2016-12-13 DIAGNOSIS — Z9989 Dependence on other enabling machines and devices: Secondary | ICD-10-CM | POA: Diagnosis not present

## 2016-12-13 DIAGNOSIS — I10 Essential (primary) hypertension: Secondary | ICD-10-CM

## 2016-12-13 DIAGNOSIS — R062 Wheezing: Secondary | ICD-10-CM | POA: Diagnosis not present

## 2016-12-13 DIAGNOSIS — E118 Type 2 diabetes mellitus with unspecified complications: Secondary | ICD-10-CM

## 2016-12-13 DIAGNOSIS — E785 Hyperlipidemia, unspecified: Secondary | ICD-10-CM

## 2016-12-13 DIAGNOSIS — G4733 Obstructive sleep apnea (adult) (pediatric): Secondary | ICD-10-CM

## 2016-12-13 DIAGNOSIS — J4531 Mild persistent asthma with (acute) exacerbation: Secondary | ICD-10-CM

## 2016-12-13 DIAGNOSIS — I5032 Chronic diastolic (congestive) heart failure: Secondary | ICD-10-CM | POA: Diagnosis not present

## 2016-12-13 DIAGNOSIS — R0602 Shortness of breath: Secondary | ICD-10-CM

## 2016-12-13 DIAGNOSIS — I2510 Atherosclerotic heart disease of native coronary artery without angina pectoris: Secondary | ICD-10-CM

## 2016-12-13 MED ORDER — FUROSEMIDE 40 MG PO TABS: ORAL_TABLET | ORAL | 3 refills | Status: DC

## 2016-12-13 MED ORDER — POTASSIUM CHLORIDE CRYS ER 10 MEQ PO TBCR: 10.0000 meq | EXTENDED_RELEASE_TABLET | Freq: Two times a day (BID) | ORAL | 3 refills | Status: DC

## 2016-12-13 MED ORDER — ALBUTEROL SULFATE HFA 108 (90 BASE) MCG/ACT IN AERS: 2.0000 | INHALATION_SPRAY | Freq: Four times a day (QID) | RESPIRATORY_TRACT | 3 refills | Status: DC | PRN

## 2016-12-13 NOTE — Patient Instructions (Signed)
Medication Instructions: Dr Sallyanne Kuster has recommended making the following medication changes: 1. INCREASE Furosemide to 80 mg (2 tablets) in the morning and 40 mg (1 tablet) in the afternoon 2. INCREASE Potassium to 10 mEq TWICE daily  Labwork: NONE ORDERED  Testing/Procedures: 1. Pulmonary Function Test - Your physician has recommended that you have a pulmonary function test. Pulmonary Function Tests are a group of tests that measure how well air moves in and out of your lungs.  Follow-up: Dr Sallyanne Kuster recommends that you schedule a follow-up appointment in 4 months.  If you need a refill on your cardiac medications before your next appointment, please call your pharmacy.

## 2016-12-13 NOTE — Progress Notes (Signed)
Cardiology Office Note    Date:  12/14/2016   ID:  [name], DOB 1945-04-28, MRN [id number]  PCP:  [name], [name], MD  Cardiologist:   Sanda Klein, MD   Chief Complaint  Patient presents with  . Follow-up  . Shortness of Breath  . Chest Pain  . Edema    Some    History of Present Illness:  Kenneth Fuller is a 71 y.o. male with morbid obesity, obstructive sleep apnea, hyperlipidemia, diabetes mellitus complicated by neuropathy, minor nonobstructive coronary atherosclerosis.  He is here in follow-up after undergoing left heart catheterization.  The study showed no change from previous catheterization: He has mild scattered plaque, without significant stenoses.  Left ventricular end-diastolic pressure was slightly elevated at 20 mmHg.  There was no evidence of significant aortic or mitral valve disease in the left ventricular ejection fraction was estimated to be 55-65%.  His echocardiogram did show evidence of grade 2 diastolic dysfunction, elevated filling pressure.  There were no significant valvular problems and the EF report was similar with angiography.  He continues to complain of shortness of breath with activity, NYHA functional class 2-3 and also still describes pressure to the left of his sternum with activity.  He is frustrated that we have yet to identify the mechanism for his symptoms.  He also seems to describe orthopnea and he states that he dreads going to bed at night sometimes because he knows the breathing will worsen.  Due to financial reasons he ran out of empagliflozin.  Interestingly his breathing worsened after that, suggesting that may be losing the diuretic effect of this medication may have led to heart failure exacerbation.  Also due to cost issues, he has not been taking his prescription for Advair or albuterol.  He does take a beta 1 selective beta-blocker, metoprolol.  He often has cough productive of thick tenacious sputum.  He will  often have wheezing.  He is wearing CPAP consistently and is using a chin strap.   Past Medical History:  Diagnosis Date  . Allergy   . Asthma   . CAD (coronary artery disease)   . Diabetes mellitus   . Fibromyalgia   . GERD (gastroesophageal reflux disease)   . GI bleeding   . Gout   . Hyperlipidemia   . Hypertension   . Hypogonadism male   . MRSA cellulitis   . Neuropathy   . Obesity   . Shortness of breath dyspnea    with exertion   . Sleep apnea    cpap- 14   . Wheezing    no asthma diagnosis    Past Surgical History:  Procedure Laterality Date  . BACK SURGERY    . CARDIAC CATHETERIZATION    . COLONOSCOPY    . neck fusion      Current Medications: Outpatient Medications Prior to Visit  Medication Sig Dispense Refill  . amLODipine (NORVASC) 10 MG tablet TAKE ONE-HALF TABLET BY MOUTH ONCE DAILY (Patient taking differently: TAKE ONE-HALF (5MG) TABLET BY MOUTH ONCE DAILY) 45 tablet 0  . atorvastatin (LIPITOR) 40 MG tablet TAKE 1 TABLET BY MOUTH ONCE DAILY 90 tablet 0  . benzonatate (TESSALON) 200 MG capsule Take 200 mg by mouth 3 (three) times daily as needed for cough.    . Blood Glucose Monitoring Suppl (PRODIGY BLOOD GLUCOSE MONITOR) W/DEVICE KIT 1 Units by Does not apply route daily. Check blood sugars 1X per day-- dx 250.02 1 each 0  . Cholecalciferol (VITAMIN  D PO) Take 1 tablet by mouth every morning.    . colchicine 0.6 MG tablet Take 1 tablet (0.6 mg total) by mouth daily. (Patient taking differently: Take 0.6 mg by mouth daily as needed (for gout flare ups). ) 30 tablet 3  . cyclobenzaprine (FLEXERIL) 5 MG tablet Take 5 mg by mouth 3 (three) times daily as needed for muscle spasms.    . empagliflozin (JARDIANCE) 10 MG TABS tablet Take 10 mg by mouth daily. 28 tablet 0  . fluticasone (FLONASE) 50 MCG/ACT nasal spray Place 2 sprays into both nostrils daily. (Patient taking differently: Place 2 sprays into both nostrils daily as needed for allergies. ) 16 g 6    . Fluticasone-Salmeterol (ADVAIR) 100-50 MCG/DOSE AEPB Inhale 1 puff into the lungs 2 (two) times daily. (Patient taking differently: Inhale 1 puff into the lungs 2 (two) times daily as needed (for respiratory issues.). ) 1 each 2  . glucose blood test strip Test 1X per day and prn- Dx Code E11.9. One Touch Ultra Test Strips 100 each 2  . metoprolol tartrate (LOPRESSOR) 50 MG tablet TAKE TWO TABLETS BY MOUTH IN THE MORNING AND ONE AT BEDTIME (Patient taking differently: TAKE 1 TABLET (50 MG) BY MOUTH TWICE DAILY) 270 tablet 1  . naphazoline-glycerin (CLEAR EYES) 0.012-0.2 % SOLN Place 1 drop into both eyes 4 (four) times daily as needed for eye irritation.    [name] omeprazole (PRILOSEC) 40 MG capsule TAKE 1 CAPSULE BY MOUTH ONCE DAILY 90 capsule 0  . ONETOUCH DELICA LANCETS 09O MISC 1 each by Does not apply route daily. Test 1X per day and prn - DX 250.02 100 each 11  . tetrahydrozoline 0.05 % ophthalmic solution Place 1 drop into both eyes 3 (three) times daily as needed (for dry eyes).    [name] albuterol (PROVENTIL HFA;VENTOLIN HFA) 108 (90 Base) MCG/ACT inhaler Inhale 2 puffs into the lungs every 6 (six) hours as needed for wheezing or shortness of breath. 1 Inhaler 2  . furosemide (LASIX) 40 MG tablet TAKE 1 BY MOUTH TWICE DAILY (Patient taking differently: Take 40 mg by mouth 2 (two) times daily. ) 180 tablet 0  . KLOR-CON M10 10 MEQ tablet TAKE 1 TABLET BY MOUTH ONCE DAILY 90 tablet 1   No facility-administered medications prior to visit.      Allergies:   Amlodipine besy-benazepril hcl; Phenergan [promethazine hcl]; and Hydrocodone   Social History   Socioeconomic History  . Marital status: Married    Spouse name: Not on file  . Number of children: 3  . Years of education: Not on file  . Highest education level: Not on file  Social Needs  . Financial resource strain: Not on file  . Food insecurity - worry: Not on file  . Food insecurity - inability: Not on file  . Transportation needs -  medical: Not on file  . Transportation needs - non-medical: Not on file  Occupational History  . Occupation: retired    Fish farm manager: DISABLED  Tobacco Use  . Smoking status: Former Smoker    Last attempt to quit: 02/08/1969    Years since quitting: 47.8  . Smokeless tobacco: Former Network engineer and Sexual Activity  . Alcohol use: No    Alcohol/week: 0.0 oz  . Drug use: No  . Sexual activity: Not on file  Other Topics Concern  . Not on file  Social History Narrative   Drinks caffeine tea occasionally      Family History:  The patient's family history includes Colon cancer in his mother; Diabetes in his father; Heart disease in his father; Kidney disease in his sister.   ROS:   Please see the history of present illness.    ROS All other systems reviewed and are negative.   PHYSICAL EXAM:   VS:  BP 128/74   Pulse 73   Ht _0  (1.702 m)   Wt 278 lb (126.1 kg)   BMI 43.54 kg/m     General: Alert, oriented x3, no distress, morbidly obese Head: no evidence of trauma, PERRL, EOMI, no exophtalmos or lid lag, no myxedema, no xanthelasma; normal ears, nose and oropharynx Neck: normal jugular venous pulsations and no hepatojugular reflux; brisk carotid pulses without delay and no carotid bruits Chest: clear to auscultation, no signs of consolidation by percussion or palpation, normal fremitus, symmetrical and full respiratory excursions Cardiovascular: normal position and quality of the apical impulse, regular rhythm, normal first and second heart sounds, no murmurs, rubs or gallops Abdomen: no tenderness or distention, no masses by palpation, no abnormal pulsatility or arterial bruits, normal bowel sounds, no hepatosplenomegaly Extremities: no clubbing, cyanosis or edema; 2+ radial, ulnar and brachial pulses bilaterally; 2+ right femoral, posterior tibial and dorsalis pedis pulses; 2+ left femoral, posterior tibial and dorsalis pedis pulses; no subclavian or femoral  bruits Neurological: grossly nonfocal Psych: Normal mood and affect   Wt Readings from Last 3 Encounters:  12/13/16 278 lb (126.1 kg)  12/02/16 275 lb (124.7 kg)  11/30/16 279 lb 6.4 oz (126.7 kg)      Studies/Labs Reviewed:   EKG:  EKG is not ordered today.  ECG from 11/30/2016 shows normal sinus rhythm and rather low voltage throughout related to obesity  Recent Labs: 11/29/2016: ALT 23 11/30/2016: BUN 13; Creatinine, Ser 0.76; Hemoglobin 13.7; Platelets 285; Potassium 4.5; Sodium 141   Lipid Panel    Component Value Date/Time   CHOL 89 (L) 11/29/2016 0800   CHOL 111 06/12/2012 1232   TRIG 113 11/29/2016 0800   TRIG 141 01/24/2014 1143   TRIG 192 (H) 06/12/2012 1232   HDL 36 (L) 11/29/2016 0800   HDL 38 (L) 01/24/2014 1143   HDL 33 (L) 06/12/2012 1232   CHOLHDL 2.5 11/29/2016 0800   CHOLHDL 4.8 11/22/2006 1712   VLDL 39 11/22/2006 1712   LDLCALC 30 11/29/2016 0800   LDLCALC 27 10/25/2013 1224   LDLCALC 40 06/12/2012 1232    Labs from 08/21/2015 BNP 72.6, hemoglobin A1c increased from 7.1% to 7.9%, creatinine 0.8, potassium 4.4, normal liver function tests, normal TSH Total cholesterol 96, triglycerides 145, HDL 40, LDL 27   ASSESSMENT:    1. Chronic diastolic heart failure ([place])   2. OSA on CPAP   3. Essential hypertension   4. Coronary artery disease involving native coronary artery of native heart without angina pectoris   5. Hyperlipidemia with target LDL less than 100   6. Type 2 diabetes mellitus with complication, without long-term current use of insulin (H[facility])   7. Morbid obesity ([place])   8. Wheezing   9. Shortness of breath   10. Mild persistent asthma with exacerbation      PLAN:  In order of problems listed above:  1. CHF: Although it is clear that morbid obesity and obstructive sleep apnea are a big part of his problem, there is also evidence of diastolic heart failure.  He appears to report orthopnea, but the filling pressures were only mildly  elevated at the time of  his cardiac catheterization.  Clearly there is some discrepancy.  In addition he describes cough productive of thick sputum and wheezing suggesting some component of chronic airway disease such as asthma or COPD.  He has not smoked since 1971 and was never a heavy smoker.  He did work in a Industry in [place] when younger, but only for a couple of years.  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  Plan to increase his diuretics but also refer him for pulmonary function testing before and after bronchodilators.  If PFTs  demonstrate substantial evidence for reactive airway disease, consider switching to an even more selective beta-blocker such as bisoprolol. 2. OSA: Compliant with the device.  He may have significant pulmonary hypertension.  He did not have a right heart catheterization.  His echo could not provide estimation of PA pressure. 3. HTN: Excellent blood pressure control 4. CAD: Repeat coronary angiography showed only minor nonobstructive atherosclerosis, no progression from 2011 5. HLP: his latest lipid profile was excellent with all parameters well within target range, except for the stubbornly decreased HDL 6. DM: Most recent hemoglobin A1c slightly above target at 7.4% 7. morbid obesity is clearly a big part of his problems with shortness of breath, but likely does not explain his orthopnea     Medication Adjustments/Labs and Tests Ordered: Current medicines are reviewed at length with the patient today.  Concerns regarding medicines are outlined above.  Medication changes, Labs and Tests ordered today are listed in the Patient Instructions below. Patient Instructions  Medication Instructions: Dr Sallyanne Kuster has recommended making the following medication changes: 1. INCREASE Furosemide to 80 mg (2 tablets) in the morning and 40 mg (1 tablet) in the afternoon 2. INCREASE Potassium to 10 mEq TWICE  daily  Labwork: NONE ORDERED  Testing/Procedures: 1. Pulmonary Function Test - Your physician has recommended that you have a pulmonary function test. Pulmonary Function Tests are a group of tests that measure how well air moves in and out of your lungs.  Follow-up: Dr Sallyanne Kuster recommends that you schedule a follow-up appointment in 4 months.  If you need a refill on your cardiac medications before your next appointment, please call your pharmacy.    Signed, Sanda Klein, MD  12/14/2016 9:13 AM    [facility] [address], [place], [place]  [address] Phone: [phone]; Fax: [phone]

## 2016-12-14 ENCOUNTER — Encounter: Payer: Self-pay | Admitting: Cardiovascular Disease

## 2016-12-15 DIAGNOSIS — G4733 Obstructive sleep apnea (adult) (pediatric): Secondary | ICD-10-CM | POA: Diagnosis not present

## 2016-12-20 ENCOUNTER — Ambulatory Visit (INDEPENDENT_AMBULATORY_CARE_PROVIDER_SITE_OTHER): Payer: PPO | Admitting: *Deleted

## 2016-12-20 ENCOUNTER — Encounter: Payer: Self-pay | Admitting: *Deleted

## 2016-12-20 VITALS — BP 149/91 | HR 69 | Ht 66.0 in | Wt 283.0 lb

## 2016-12-20 DIAGNOSIS — Z0000 Encounter for general adult medical examination without abnormal findings: Secondary | ICD-10-CM

## 2016-12-20 MED ORDER — AMLODIPINE BESYLATE 5 MG PO TABS: 5.0000 mg | ORAL_TABLET | Freq: Every day | ORAL | 0 refills | Status: DC

## 2016-12-20 NOTE — Patient Instructions (Signed)
  Mr. Powell , Thank you for taking time to come for your Medicare Wellness Visit. I appreciate your ongoing commitment to your health goals. Please review the following plan we discussed and let me know if I can assist you in the future.   These are the goals we discussed: Try to perform chair exercises daily. See handout.   This is a list of the screening recommended for you and due dates:  Health Maintenance  Topic Date Due  . Eye exam for diabetics  12/07/2016  . Tetanus Vaccine  12/24/2016*  . Complete foot exam   03/25/2017  . Urine Protein Check  03/25/2017  . Hemoglobin A1C  05/30/2017  . Colon Cancer Screening  07/30/2020  . Flu Shot  Completed  .  Hepatitis C: One time screening is recommended by Center for Disease Control  (CDC) for  adults born from [birth year] through 1964.   Completed  . Pneumonia vaccines  Addressed  *Topic was postponed. The date shown is not the original due date.

## 2016-12-20 NOTE — Progress Notes (Addendum)
Subjective:   Kenneth Fuller is a 71 y.o. male who presents for a subsequent Medicare Annual Wellness Visit.  Review of Systems  Health is about the same as last year.   Cardiac Risk Factors include: advanced age (>3mn, >>3women);diabetes mellitus;dyslipidemia;male gender;sedentary lifestyle;hypertension;obesity (BMI >30kg/m2);family history of premature cardiovascular disease  Musculoskeletal: chronic back pain. Hx of multiple surgeries.   Urology: Frequent urination and nocturia. Gets up about every 1.5 hours to urinate.     Other systems negative.   Objective:    Today's Vitals   12/20/16 1056  BP: (!) 149/91  Pulse: 69  Weight: 283 lb (128.4 kg)  Height: 5' 6" (1.676 m)   Body mass index is 45.68 kg/m.  Current Medications (verified) Outpatient Encounter Medications as of 12/20/2016  Medication Sig  . albuterol (PROVENTIL HFA;VENTOLIN HFA) 108 (90 Base) MCG/ACT inhaler Inhale 2 puffs every 6 (six) hours as needed into the lungs for wheezing or shortness of breath.  .Marland KitchenamLODipine (NORVASC) 5 MG tablet Take 1 tablet (5 mg total) daily by mouth.  .Marland Kitchenatorvastatin (LIPITOR) 40 MG tablet TAKE 1 TABLET BY MOUTH ONCE DAILY  . benzonatate (TESSALON) 200 MG capsule Take 200 mg by mouth 3 (three) times daily as needed for cough.  . Blood Glucose Monitoring Suppl (PRODIGY BLOOD GLUCOSE MONITOR) W/DEVICE KIT 1 Units by Does not apply route daily. Check blood sugars 1X per day-- dx 250.02  . Cholecalciferol (VITAMIN D PO) Take 1 tablet by mouth every morning.  . colchicine 0.6 MG tablet Take 1 tablet (0.6 mg total) by mouth daily. (Patient taking differently: Take 0.6 mg by mouth daily as needed (for gout flare ups). )  . cyclobenzaprine (FLEXERIL) 5 MG tablet Take 5 mg by mouth 3 (three) times daily as needed for muscle spasms.  . fluticasone (FLONASE) 50 MCG/ACT nasal spray Place 2 sprays into both nostrils daily. (Patient taking differently: Place 2 sprays into both nostrils  daily as needed for allergies. )  . Fluticasone-Salmeterol (ADVAIR) 100-50 MCG/DOSE AEPB Inhale 1 puff into the lungs 2 (two) times daily. (Patient taking differently: Inhale 1 puff into the lungs 2 (two) times daily as needed (for respiratory issues.). )  . furosemide (LASIX) 40 MG tablet Take 2 tablets (80 mg total) every morning by mouth AND 1 tablet (40 mg total) every evening.  .Marland Kitchenglucose blood test strip Test 1X per day and prn- Dx Code E11.9. One Touch Ultra Test Strips  . metoprolol tartrate (LOPRESSOR) 50 MG tablet TAKE TWO TABLETS BY MOUTH IN THE MORNING AND ONE AT BEDTIME (Patient taking differently: TAKE 1 TABLET (50 MG) BY MOUTH TWICE DAILY)  . omeprazole (PRILOSEC) 40 MG capsule TAKE 1 CAPSULE BY MOUTH ONCE DAILY  . ONETOUCH DELICA LANCETS 317CMISC 1 each by Does not apply route daily. Test 1X per day and prn - DX 250.02  . potassium chloride (KLOR-CON M10) 10 MEQ tablet Take 1 tablet (10 mEq total) 2 (two) times daily by mouth.  . empagliflozin (JARDIANCE) 10 MG TABS tablet Take 10 mg by mouth daily. (Patient not taking: Reported on 12/20/2016)  . naphazoline-glycerin (CLEAR EYES) 0.012-0.2 % SOLN Place 1 drop into both eyes 4 (four) times daily as needed for eye irritation.  . [DISCONTINUED] amLODipine (NORVASC) 10 MG tablet TAKE ONE-HALF TABLET BY MOUTH ONCE DAILY (Patient taking differently: TAKE ONE-HALF (5MG) TABLET BY MOUTH ONCE DAILY)  . [DISCONTINUED] tetrahydrozoline 0.05 % ophthalmic solution Place 1 drop into both eyes 3 (three) times daily  as needed (for dry eyes).   No facility-administered encounter medications on file as of 12/20/2016.     Allergies (verified) Amlodipine besy-benazepril hcl; Phenergan [promethazine hcl]; and Hydrocodone   History: Past Medical History:  Diagnosis Date  . Allergy   . Asthma   . CAD (coronary artery disease)   . Diabetes mellitus   . Fibromyalgia   . GERD (gastroesophageal reflux disease)   . GI bleeding   . Gout   .  Hyperlipidemia   . Hypertension   . Hypogonadism male   . MRSA cellulitis   . Neuropathy   . Obesity   . Shortness of breath dyspnea    with exertion   . Sleep apnea    cpap- 14   . Wheezing    no asthma diagnosis   Past Surgical History:  Procedure Laterality Date  . BACK SURGERY    . CARDIAC CATHETERIZATION    . COLONOSCOPY    . neck fusion     Family History  Problem Relation Age of Onset  . Colon cancer Mother   . Diabetes Father        siblings  . Heart disease Father        brother  . Heart attack Father   . Kidney disease Sister   . Heart failure Sister   . Heart disease Brother   . Heart attack Son 42  . Drug abuse Sister   . Heart disease Brother   . Deep vein thrombosis Brother   . Colon polyps Neg Hx    Social History   Occupational History  . 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 Smoking status: Former Smoker    Last attempt to quit: 02/08/1969    Years since quitting: 47.8  . Smokeless tobacco: Former Network engineer and Sexual Activity  . Alcohol use: No    Alcohol/week: 0.0 oz  . Drug use: No  . Sexual activity: Not on file   Tobacco Counseling No tobacco use   Activities of Daily Living In your present state of health, do you have any difficulty performing the following activities: 12/20/2016 12/02/2016  Hearing? N N  Vision? N N  Difficulty concentrating or making decisions? N N  Walking or climbing stairs? Y N  Comment back and leg pain and dyspnea -  Dressing or bathing? N N  Doing errands, shopping? N -  Preparing Food and eating ? N -  Using the Toilet? N -  In the past six months, have you accidently leaked urine? N -  Do you have problems with loss of bowel control? N -  Managing your Medications? N -  Comment uses pill box -  Managing your Finances? N -  Housekeeping or managing your Housekeeping? N -  Some recent data might be hidden    Immunizations and Health  Maintenance Immunization History  Administered Date(s) Administered  . Influenza Whole 10/09/2008  . Influenza, High Dose Seasonal PF 11/29/2016  . Influenza,inj,Quad PF,6+ Mos 12/04/2013, 12/04/2014, 12/23/2015  . Pneumococcal Conjugate-13 12/04/2013   Health Maintenance Due  Topic Date Due  . OPHTHALMOLOGY EXAM  12/07/2016    Patient Care Team: [name], [name], MD as PCP - General (Family Medicine) Melina Schools, MD as Consulting Physician (Orthopedic Surgery) Harlen Labs, MD as Referring Physician (Optometry)   No hospitalizations, ER visits, or surgeries this past year.     Assessment:   This  is a routine wellness examination for [place].   Hearing/Vision screen No deficits noted during visit. Eye exam is due. Will schedule in Jan when insurance changes.   Dietary issues and exercise activities discussed: Current Exercise Habits: The patient does not participate in regular exercise at present, Exercise limited by: cardiac condition(s);orthopedic condition(s)  Goals Chair Exercises daily. Handout given and reviewed.   Depression Screen PHQ 2/9 Scores 11/29/2016 07/28/2016 03/25/2016 12/23/2015  PHQ - 2 Score 0 0 0 2  PHQ- 9 Score - - - 10    Fall Risk Fall Risk  11/29/2016 08/04/2016 07/28/2016 06/23/2016 03/25/2016  Falls in the past year? No Yes No Yes Yes  Number falls in past yr: - 2 or more - 1 1  Injury with Fall? - Yes - Yes Yes  Comment - fractured neck - broken C-1 vertebrate Broken vertibrae in neck.  Follow up - - - - -    Cognitive Function: MMSE - Mini Mental State Exam 12/20/2016 12/04/2014  Orientation to time 5 5  Orientation to Place 5 5  Registration 3 3  Attention/ Calculation 5 5  Recall 3 3  Language- name 2 objects 2 2  Language- repeat 1 1  Language- follow 3 step command 3 3  Language- read & follow direction 1 1  Write a sentence 1 1  Copy design 1 1  Total score 30 30    Normal exam    Screening Tests Health Maintenance   Topic Date Due  . OPHTHALMOLOGY EXAM  12/07/2016  . TETANUS/TDAP  12/24/2016 (Originally 05/02/1964)  . FOOT EXAM  03/25/2017  . URINE MICROALBUMIN  03/25/2017  . HEMOGLOBIN A1C  05/30/2017  . COLONOSCOPY  07/30/2020  . INFLUENZA VACCINE  Completed  . Hepatitis C Screening  Completed  . PNA vac Low Risk Adult  Addressed        Plan:  Chair exercises daily.  Schedule eye exam. Have report sent to our office.  Keep f/u with PCP  I have personally reviewed and noted the following in the patient's chart:   . Medical and social history . Use of alcohol, tobacco or illicit drugs  . Current medications and supplements . Functional ability and status . Nutritional status . Physical activity . Advanced directives . List of other physicians . Hospitalizations, surgeries, and ER visits in previous 12 months . Vitals . Screenings to include cognitive, depression, and falls . Referrals and appointments  In addition, I have reviewed and discussed with patient certain preventive protocols, quality metrics, and best practice recommendations. A written personalized care plan for preventive services as well as general preventive health recommendations were provided to patient.     [name], RN  12/20/2016   I have reviewed and agree with the above AWV documentation.   Evelina Dun, FNP

## 2016-12-23 ENCOUNTER — Ambulatory Visit (HOSPITAL_COMMUNITY)
Admission: RE | Admit: 2016-12-23 | Discharge: 2016-12-23 | Disposition: A | Discharge status: Home / Self Care | Payer: PPO | Source: Ambulatory Visit | Attending: Cardiovascular Disease | Admitting: Cardiovascular Disease

## 2016-12-23 DIAGNOSIS — R062 Wheezing: Secondary | ICD-10-CM | POA: Insufficient documentation

## 2016-12-23 DIAGNOSIS — R0602 Shortness of breath: Secondary | ICD-10-CM

## 2016-12-23 LAB — PULMONARY FUNCTION TEST
DL/VA % pred: 126 %
DL/VA: 5.56 ml/min/mmHg/L
DLCO UNC % PRED: 68 %
DLCO UNC: 19.37 ml/min/mmHg
FEF 25-75 POST: 2.83 L/s
FEF 25-75 Pre: 1.55 L/sec
FEF2575-%Change-Post: 82 %
FEF2575-%PRED-POST: 130 %
FEF2575-%Pred-Pre: 71 %
FEV1-%CHANGE-POST: 22 %
FEV1-%PRED-POST: 56 %
FEV1-%Pred-Pre: 45 %
FEV1-POST: 1.61 L
FEV1-Pre: 1.32 L
FEV1FVC-%CHANGE-POST: -5 %
FEV1FVC-%PRED-PRE: 114 %
FEV6-%Change-Post: 30 %
FEV6-%PRED-PRE: 42 %
FEV6-%Pred-Post: 55 %
FEV6-Post: 2.06 L
FEV6-Pre: 1.58 L
FEV6FVC-%Pred-Post: 106 %
FEV6FVC-%Pred-Pre: 106 %
FVC-%CHANGE-POST: 30 %
FVC-%PRED-PRE: 40 %
FVC-%Pred-Post: 52 %
FVC-POST: 2.06 L
FVC-PRE: 1.58 L
POST FEV6/FVC RATIO: 100 %
PRE FEV1/FVC RATIO: 84 %
PRE FEV6/FVC RATIO: 100 %
Post FEV1/FVC ratio: 79 %
RV % PRED: 115 %
RV: 2.65 L
TLC % pred: 72 %
TLC: 4.64 L

## 2016-12-23 MED ORDER — ALBUTEROL SULFATE (2.5 MG/3ML) 0.083% IN NEBU
2.5000 mg | INHALATION_SOLUTION | Freq: Once | RESPIRATORY_TRACT | Status: AC
  Administered 2016-12-23: 2.5 mg via RESPIRATORY_TRACT

## 2016-12-27 ENCOUNTER — Telehealth: Payer: Self-pay

## 2016-12-27 DIAGNOSIS — R942 Abnormal results of pulmonary function studies: Secondary | ICD-10-CM

## 2016-12-27 MED ORDER — BISOPROLOL FUMARATE 10 MG PO TABS: 10.0000 mg | ORAL_TABLET | Freq: Every day | ORAL | 3 refills | Status: DC

## 2016-12-27 NOTE — Telephone Encounter (Signed)
Called patient with results. Patient verbalized understanding and agreed with plan. Rx(s) sent to patient's preferred pharmacy electronically. Pulmonology referral ordered.

## 2016-12-27 NOTE — Telephone Encounter (Signed)
-----  Message from Sanda Klein, MD sent at 12/23/2016 11:06 AM EST ----- Pulmonary function tests are quite abnormal.  Please refer to pulmonology.  Stop metoprolol and take bisoprolol 10 mg daily instead

## 2017-01-14 ENCOUNTER — Encounter: Payer: Self-pay | Admitting: Family

## 2017-01-14 ENCOUNTER — Other Ambulatory Visit: Payer: Self-pay | Admitting: Family

## 2017-01-14 ENCOUNTER — Ambulatory Visit (INDEPENDENT_AMBULATORY_CARE_PROVIDER_SITE_OTHER): Payer: PPO

## 2017-01-14 ENCOUNTER — Ambulatory Visit: Payer: PPO | Admitting: Family

## 2017-01-14 VITALS — BP 166/86 | HR 74 | Temp 97.1°F | Ht 66.0 in | Wt 286.6 lb

## 2017-01-14 DIAGNOSIS — R05 Cough: Secondary | ICD-10-CM

## 2017-01-14 DIAGNOSIS — J4551 Severe persistent asthma with (acute) exacerbation: Secondary | ICD-10-CM | POA: Diagnosis not present

## 2017-01-14 DIAGNOSIS — G4733 Obstructive sleep apnea (adult) (pediatric): Secondary | ICD-10-CM | POA: Diagnosis not present

## 2017-01-14 DIAGNOSIS — R0602 Shortness of breath: Secondary | ICD-10-CM

## 2017-01-14 DIAGNOSIS — R059 Cough, unspecified: Secondary | ICD-10-CM

## 2017-01-14 MED ORDER — MONTELUKAST SODIUM 10 MG PO TABS: 10.0000 mg | ORAL_TABLET | Freq: Every day | ORAL | 3 refills | Status: DC

## 2017-01-14 MED ORDER — FLUTICASONE-SALMETEROL 250-50 MCG/DOSE IN AEPB: 1.0000 | INHALATION_SPRAY | Freq: Two times a day (BID) | RESPIRATORY_TRACT | 3 refills | Status: DC

## 2017-01-14 MED ORDER — PREDNISONE 10 MG (21) PO TBPK: ORAL_TABLET | ORAL | 0 refills | Status: DC

## 2017-01-14 MED ORDER — LEVOFLOXACIN 500 MG PO TABS: 500.0000 mg | ORAL_TABLET | Freq: Every day | ORAL | 0 refills | Status: AC

## 2017-01-14 NOTE — Patient Instructions (Addendum)

## 2017-01-14 NOTE — Progress Notes (Addendum)
Subjective:    Patient ID: Kenneth Fuller, male    DOB: 1945/11/06, 71 y.o.   MRN: [id number]  PT presents to the office today with complaints of cough and SOB. PT has seen his Cardiologists 12/13/16 for similar symptoms. Pt had Heart cath and pulmonary function test. Pt was referred to Pulmonologist. Pt has appt in next couple of weeks.    Cough  This is a new problem. The current episode started 1 to 4 weeks ago. The problem has been gradually worsening. The problem occurs every few minutes. The cough is non-productive. Associated symptoms include ear congestion, headaches, myalgias, nasal congestion, shortness of breath and wheezing. Pertinent negatives include no chills, ear pain, fever or sore throat. The symptoms are aggravated by lying down. He has tried rest for the symptoms. The treatment provided mild relief. His past medical history is significant for COPD.      Review of Systems  Constitutional: Negative for chills and fever.  HENT: Negative for ear pain and sore throat.   Respiratory: Positive for cough, shortness of breath and wheezing.   Musculoskeletal: Positive for myalgias.  Neurological: Positive for headaches.  All other systems reviewed and are negative.      Objective:   Physical Exam  Constitutional: He is oriented to person, place, and time. He appears well-developed and well-nourished. No distress.  Morbid obese, very strong heavy cologne  HENT:  Head: Normocephalic.  Right Ear: External ear normal.  Left Ear: External ear normal.  Nose: Mucosal edema and rhinorrhea present.  Mouth/Throat: Posterior oropharyngeal erythema present.  Eyes: Pupils are equal, round, and reactive to light. Right eye exhibits no discharge. Left eye exhibits no discharge.  Neck: Normal range of motion. Neck supple. No thyromegaly present.  Cardiovascular: Normal rate, regular rhythm, normal heart sounds and intact distal pulses.  No murmur heard. Pulmonary/Chest: Effort  normal and breath sounds normal. No respiratory distress. He has no wheezes.  Dry constant cough  Abdominal: Soft. Bowel sounds are normal. He exhibits no distension. There is no tenderness.  Musculoskeletal: Normal range of motion. He exhibits no edema or tenderness.  Neurological: He is alert and oriented to person, place, and time.  Skin: Skin is warm and dry. No rash noted. No erythema.  Psychiatric: He has a normal mood and affect. His behavior is normal. Judgment and thought content normal.  Vitals reviewed.  Chest x-ray- Negative  BP (!) 166/86   Pulse 74   Temp (!) 97.1 F (36.2 C) (Oral)   Ht _0  (1.676 m)   Wt 286 lb 9.6 oz (130 kg)   SpO2 93%   BMI 46.26 kg/m      Assessment & Plan:  1. SOB (shortness of breath) - DG Chest 2 View; Future - predniSONE (STERAPRED UNI-PAK 21 TAB) 10 MG (21) TBPK tablet; Use as directed  Dispense: 21 tablet; Refill: 0  2. Cough - DG Chest 2 View; Future - predniSONE (STERAPRED UNI-PAK 21 TAB) 10 MG (21) TBPK tablet; Use as directed  Dispense: 21 tablet; Refill: 0  3. Severe persistent asthma with acute exacerbation - predniSONE (STERAPRED UNI-PAK 21 TAB) 10 MG (21) TBPK tablet; Use as directed  Dispense: 21 tablet; Refill: 0 - Fluticasone-Salmeterol (ADVAIR DISKUS) 250-50 MCG/DOSE AEPB; Inhale 1 puff into the lungs 2 (two) times daily.  Dispense: 1 each; Refill: 3   Pt has VERY heavy cologne on today Discussed importance that triggers of asthma attack- Avoid perfumes, cleaning products, air fresheners, ect  Will  give prednisone and increase Advair to 250-50 mcg from 100-58mg Keep Pul monologists appt!! If SOB worsens or does not improve go to the ED!!!  CEvelina Dun FNP

## 2017-01-14 NOTE — Addendum Note (Signed)
Addended by: Evelina Dun A on: 01/14/2017 03:44 PM   Modules accepted: Orders

## 2017-01-24 ENCOUNTER — Other Ambulatory Visit: Payer: Self-pay | Admitting: Family Medicine

## 2017-01-26 ENCOUNTER — Encounter: Payer: Self-pay | Admitting: Internal Medicine

## 2017-01-26 ENCOUNTER — Ambulatory Visit: Payer: PPO | Admitting: Internal Medicine

## 2017-01-26 VITALS — BP 132/68 | HR 63 | Ht 67.0 in | Wt 281.2 lb

## 2017-01-26 DIAGNOSIS — J4551 Severe persistent asthma with (acute) exacerbation: Secondary | ICD-10-CM

## 2017-01-26 DIAGNOSIS — R0600 Dyspnea, unspecified: Secondary | ICD-10-CM

## 2017-01-26 DIAGNOSIS — R05 Cough: Secondary | ICD-10-CM

## 2017-01-26 DIAGNOSIS — R0602 Shortness of breath: Secondary | ICD-10-CM | POA: Insufficient documentation

## 2017-01-26 DIAGNOSIS — R059 Cough, unspecified: Secondary | ICD-10-CM

## 2017-01-26 DIAGNOSIS — R0609 Other forms of dyspnea: Secondary | ICD-10-CM

## 2017-01-26 LAB — NITRIC OXIDE: Nitric Oxide: 8

## 2017-01-26 NOTE — Progress Notes (Signed)
Subjective:    Patient ID: Kenneth Fuller, male    DOB: Jun 01, 1945, 71 y.o.   MRN: [id number]   PCP [name], Kenneth Kaufmann, MD  HPI  IOV 01/26/2017  Chief Complaint  Patient presents with  . Advice Only    Referred by cardiology due to abn PFT. PFT done 12/23/16.     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  He is here for shortness of breath and cough.  History is somewhat poor and is gathered by talking to him and review of the past medical history in chart.  As best as I can gather he is to have asthma as a child but then he says he outgrew it.  For the last 5 years or so he is on Advair and Singulair for shortness of breath although he says specifically it is not meant to be for asthma or COPD.  However he also tells me that he is only been symptomatic for the last year or so.  He had insidious onset of shortness of breath for the last 1 year progressively worse.  Insidious onset of cough for the last 6 months and progressively worse.  Cough does not bother him at night.  But in the daytime he is coughing all the time it is a dry cough in quality.  He gets short of breath changing clothes and doing minimal activities class III exertion.  Relieved by rest no associated chest pain.  Chart review shows that he has had cardiac workup.  Echocardiogram October 2018 showed grade 2 diastolic dysfunction.  He then underwent cardiac catheterization October 2018 that was nonobstructive coronary artery disease.  Walking desaturation test on 01/26/2017 185 feet x 3 laps on ROOM AIR:  did not desaturate. Rest pulse ox was 99%, final pulse ox was 97%. HR response 67/min at rest to 81/min at peak exertion. Patient Kenneth Fuller  no Desaturate < 88% . Kenneth Fuller did not  Desaturated </= 3% points. Kenneth Fuller did not get tachyardic.  He did get pulmonary function test on December 23, 2016 that shows restriction with reduced diffusion capacity.  Total lung capacity 72% FVC, 1.6  L / 40% and DLCO of 19.4/68%.  Feno 01/26/2017 ->  8ppb and normal  CXR in 2018 personally visualied - suggestive of atelectasis and effusions  Of note in October 2017 he was diagnosed to have very severe sleep apnea with apnea hypotony index of greater than 100/h and with desaturations.[name]  He is being managed by Dr. Ellouise Newer.  He tells me that no oxygen was prescribed.  He supposed to be using CPAP but he is noncompliant with it.   Review of Systems  Constitutional: Negative for fever and unexpected weight change.  HENT: Positive for postnasal drip and sinus pressure. Negative for congestion, dental problem, ear pain, nosebleeds, rhinorrhea, sneezing, sore throat and trouble swallowing.   Eyes: Negative for redness and itching.  Respiratory: Positive for cough, chest tightness and shortness of breath. Negative for wheezing.   Cardiovascular: Positive for leg swelling. Negative for palpitations.  Gastrointestinal: Negative for nausea and vomiting.  Genitourinary: Negative for dysuria.  Musculoskeletal: Negative for joint swelling.  Skin: Negative for rash.  Allergic/Immunologic: Negative.  Negative for environmental allergies, food allergies and immunocompromised state.  Neurological: Negative for headaches.  Hematological: Bruises/bleeds easily.  Psychiatric/Behavioral: Negative for dysphoric mood. The patient is nervous/anxious.      has a past medical history of Allergy, Asthma, CAD (coronary artery disease),  Diabetes mellitus, Fibromyalgia, GERD (gastroesophageal reflux disease), GI bleeding, Gout, Hyperlipidemia, Hypertension, Hypogonadism male, MRSA cellulitis, Neuropathy, Obesity, Shortness of breath dyspnea, Sleep apnea, and Wheezing.   reports that he quit smoking about 47 years ago. He has quit using smokeless tobacco.  Past Surgical History:  Procedure Laterality Date  . BACK SURGERY    . CARDIAC CATHETERIZATION    . COLONOSCOPY    . LEFT HEART CATH AND CORONARY  ANGIOGRAPHY N/A 12/02/2016   Procedure: LEFT HEART CATH AND CORONARY ANGIOGRAPHY;  Surgeon: Jettie Booze, MD;  Location: [place] [facility];  Service: Cardiovascular;  Laterality: N/A;  . LUMBAR LAMINECTOMY/DECOMPRESSION MICRODISCECTOMY N/A 01/02/2014   Procedure: CENTRAL DECOMPRESSION LUMBAR LAMINECTOMY L3-L4, L4-L5;  Surgeon: Tobi Bastos, MD;  Location: [facility];  Service: Orthopedics;  Laterality: N/A;  . neck fusion      Allergies  Allergen Reactions  . Amlodipine Besy-Benazepril Hcl Swelling and Other (See Comments)    Makes tongue swell (lotrel)  . Phenergan [Promethazine Hcl] Other (See Comments)    "I can't remember."  . Hydrocodone Itching    Can tolerate in low doses    Immunization History  Administered Date(s) Administered  . Influenza Whole 10/09/2008  . Influenza, High Dose Seasonal PF 11/29/2016  . Influenza,inj,Quad PF,6+ Mos 12/04/2013, 12/04/2014, 12/23/2015  . Pneumococcal Conjugate-13 12/04/2013    Family History  Problem Relation Age of Onset  . Colon cancer Mother   . Diabetes Father        siblings  . Heart disease Father        brother  . Heart attack Father   . Kidney disease Sister   . Heart failure Sister   . Heart disease Brother   . Heart attack Son 1  . Drug abuse Sister   . Heart disease Brother   . Deep vein thrombosis Brother   . Colon polyps Neg Hx      Current Outpatient Medications:  .  albuterol (PROVENTIL HFA;VENTOLIN HFA) 108 (90 Base) MCG/ACT inhaler, Inhale 2 puffs every 6 (six) hours as needed into the lungs for wheezing or shortness of breath., Disp: 1 Inhaler, Rfl: 3 .  amLODipine (NORVASC) 5 MG tablet, Take 1 tablet (5 mg total) daily by mouth., Disp: 90 tablet, Rfl: 0 .  atorvastatin (LIPITOR) 40 MG tablet, TAKE 1 TABLET BY MOUTH ONCE DAILY, Disp: 90 tablet, Rfl: 0 .  bisoprolol (ZEBETA) 10 MG tablet, Take 1 tablet (10 mg total) daily by mouth., Disp: 90 tablet, Rfl: 3 .  Blood Glucose Monitoring Suppl (PRODIGY  BLOOD GLUCOSE MONITOR) W/DEVICE KIT, 1 Units by Does not apply route daily. Check blood sugars 1X per day-- dx 250.02, Disp: 1 each, Rfl: 0 .  Cholecalciferol (VITAMIN D PO), Take 1 tablet by mouth every morning., Disp: , Rfl:  .  colchicine 0.6 MG tablet, Take 1 tablet (0.6 mg total) by mouth daily. (Patient taking differently: Take 0.6 mg by mouth daily as needed (for gout flare ups). ), Disp: 30 tablet, Rfl: 3 .  cyclobenzaprine (FLEXERIL) 5 MG tablet, Take 5 mg by mouth 3 (three) times daily as needed for muscle spasms., Disp: , Rfl:  .  empagliflozin (JARDIANCE) 10 MG TABS tablet, Take 10 mg by mouth daily., Disp: 28 tablet, Rfl: 0 .  fluticasone (FLONASE) 50 MCG/ACT nasal spray, Place 2 sprays into both nostrils daily. (Patient taking differently: Place 2 sprays into both nostrils daily as needed for allergies. ), Disp: 16 g, Rfl: 6 .  Fluticasone-Salmeterol (ADVAIR  DISKUS) 250-50 MCG/DOSE AEPB, Inhale 1 puff into the lungs 2 (two) times daily., Disp: 1 each, Rfl: 3 .  furosemide (LASIX) 40 MG tablet, Take 2 tablets (80 mg total) every morning by mouth AND 1 tablet (40 mg total) every evening., Disp: 270 tablet, Rfl: 3 .  glucose blood test strip, Test 1X per day and prn- Dx Code E11.9. One Touch Ultra Test Strips, Disp: 100 each, Rfl: 2 .  montelukast (SINGULAIR) 10 MG tablet, Take 1 tablet (10 mg total) by mouth at bedtime., Disp: 30 tablet, Rfl: 3 .  omeprazole (PRILOSEC) 40 MG capsule, TAKE 1 CAPSULE BY MOUTH ONCE DAILY, Disp: 90 capsule, Rfl: 0 .  ONETOUCH DELICA LANCETS 81X MISC, 1 each by Does not apply route daily. Test 1X per day and prn - DX 250.02, Disp: 100 each, Rfl: 11 .  potassium chloride (KLOR-CON M10) 10 MEQ tablet, Take 1 tablet (10 mEq total) 2 (two) times daily by mouth., Disp: 180 tablet, Rfl: 3     Objective:   Physical Exam  Constitutional: He is oriented to person, place, and time. He appears well-developed and well-nourished. No distress.  HENT:  Head: Normocephalic  and atraumatic.  Right Ear: External ear normal.  Left Ear: External ear normal.  Mouth/Throat: Oropharynx is clear and moist. No oropharyngeal exudate.  Eyes: Conjunctivae and EOM are normal. Pupils are equal, round, and reactive to light. Right eye exhibits no discharge. Left eye exhibits no discharge. No scleral icterus.  Neck: Normal range of motion. Neck supple. No JVD present. No tracheal deviation present. No thyromegaly present.  Cardiovascular: Normal rate, regular rhythm and intact distal pulses. Exam reveals no gallop and no friction rub.  No murmur heard. Pulmonary/Chest: Effort normal and breath sounds normal. No respiratory distress. He has no wheezes. He has no rales. He exhibits no tenderness.  Abdominal: Soft. Bowel sounds are normal. He exhibits no distension and no mass. There is no tenderness. There is no rebound and no guarding.  Musculoskeletal: Normal range of motion. He exhibits no edema or tenderness.  Lymphadenopathy:    He has no cervical adenopathy.  Neurological: He is alert and oriented to person, place, and time. He has normal reflexes. No cranial nerve deficit. Coordination normal.  Skin: Skin is warm and dry. No rash noted. He is not diaphoretic. No erythema. No pallor.  Psychiatric: He has a normal mood and affect. His behavior is normal. Judgment and thought content normal.  Nursing note and vitals reviewed.   Vitals:   01/26/17 1400  BP: 132/68  Pulse: 63  SpO2: 95%  Weight: 281 lb 3.2 oz (127.6 kg)  Height: _0  (1.702 m)    Estimated body mass index is 44.04 kg/m as calculated from the following:   Height as of this encounter: _1  (1.702 m).   Weight as of this encounter: 281 lb 3.2 oz (127.6 kg).       Assessment & Plan:     ICD-10-CM   1.     2. Cough R05 Nitric oxide    CANCELED: POCT EXHALED NITRIC OXIDE  3. Dyspnea on exertion R06.09     - remains unexplained but likely due to stiff heart muscle, physical decondtioning - need  to rule out scar tissue or fibrosis of lung  Plan - do HRCT supine and prone  - do ONO test on room air  Followup  - return to see APP after above; if tests are non-contributory then will need CPST bikle test  -  return in next few weeks    Dr. Brand Males, M.D., [facility].C.P Pulmonary and Critical Care Medicine Staff Physician, [place] Director - Interstitial Lung Disease  Program  Pulmonary [place] at [place], [place], [address]  Pager: [phone], If no answer or between  15:00h - 7:00h: call [phone] Telephone: [phone]

## 2017-01-26 NOTE — Addendum Note (Signed)
Addended by: Rosana Berger on: 01/26/2017 02:54 PM   Modules accepted: Orders

## 2017-01-26 NOTE — Patient Instructions (Addendum)
Cough Shortness of breath  - remains unexplained but likely due to stiff heart muscle, physical decondtioning - need to rule out scar tissue or fibrosis of lung  Plan - do HRCT supine and prone  - do ONO test on room air  Followup  - return to see APP after above; if tests are non-contributory then will need CPST bikle test  - return in next few weeks

## 2017-01-28 ENCOUNTER — Encounter: Payer: Self-pay | Admitting: Internal Medicine

## 2017-01-28 ENCOUNTER — Telehealth: Payer: Self-pay | Admitting: Family Medicine

## 2017-01-28 DIAGNOSIS — R0600 Dyspnea, unspecified: Secondary | ICD-10-CM | POA: Diagnosis not present

## 2017-01-28 MED ORDER — METFORMIN HCL 1000 MG PO TABS: 1000.0000 mg | ORAL_TABLET | Freq: Two times a day (BID) | ORAL | 1 refills | Status: DC

## 2017-01-28 NOTE — Telephone Encounter (Signed)
Patient aware and rx sent to pharmacy.

## 2017-01-28 NOTE — Telephone Encounter (Signed)
In chart Metformin 127m was d/c on 10/25 (stop taking at discharge). Patient states he was never told to d/c and he has been taking it. Is out of medication and would like to know if he needs to d/c or continue taking? Please advise and send to pools.

## 2017-01-28 NOTE — Telephone Encounter (Signed)
As far as I am concerned I cannot see a reason why he should stop the metformin, I would have him continue to take the metformin 1000 and give him a refill for at least 3 months of it

## 2017-02-02 ENCOUNTER — Ambulatory Visit (INDEPENDENT_AMBULATORY_CARE_PROVIDER_SITE_OTHER)
Admission: RE | Admit: 2017-02-02 | Discharge: 2017-02-02 | Disposition: A | Discharge status: Home / Self Care | Payer: PPO | Source: Ambulatory Visit | Attending: Internal Medicine | Admitting: Internal Medicine

## 2017-02-02 DIAGNOSIS — R0602 Shortness of breath: Secondary | ICD-10-CM

## 2017-02-02 DIAGNOSIS — R0609 Other forms of dyspnea: Secondary | ICD-10-CM

## 2017-02-02 DIAGNOSIS — R0600 Dyspnea, unspecified: Secondary | ICD-10-CM

## 2017-02-02 DIAGNOSIS — R05 Cough: Secondary | ICD-10-CM | POA: Diagnosis not present

## 2017-02-07 ENCOUNTER — Other Ambulatory Visit: Payer: Self-pay | Admitting: Family Medicine

## 2017-02-09 ENCOUNTER — Telehealth: Payer: Self-pay | Admitting: Internal Medicine

## 2017-02-09 DIAGNOSIS — G4734 Idiopathic sleep related nonobstructive alveolar hypoventilation: Secondary | ICD-10-CM

## 2017-02-09 NOTE — Telephone Encounter (Signed)
Called pt who stated that he has a non prod cough and SOB. Pt stated that he denies any fever or body aches. Scheduled an appt for pt tomorrow, 02/10/17 with MW at 4:00. Nothing further needed.

## 2017-02-09 NOTE — Telephone Encounter (Signed)
Spoke with Danae Chen at [facility]. Gae Gallop that we have received this order request and pt's ONO report. We are waiting for MR to review these results. Erica verbalized understanding.  MR - please advise. Thanks.

## 2017-02-10 ENCOUNTER — Ambulatory Visit: Payer: PPO | Admitting: Internal Medicine

## 2017-02-10 ENCOUNTER — Encounter: Payer: Self-pay | Admitting: Internal Medicine

## 2017-02-10 VITALS — BP 148/96 | HR 72 | Ht 67.0 in | Wt 278.0 lb

## 2017-02-10 DIAGNOSIS — J45991 Cough variant asthma: Secondary | ICD-10-CM

## 2017-02-10 DIAGNOSIS — G4733 Obstructive sleep apnea (adult) (pediatric): Secondary | ICD-10-CM | POA: Diagnosis not present

## 2017-02-10 DIAGNOSIS — R058 Other specified cough: Secondary | ICD-10-CM

## 2017-02-10 DIAGNOSIS — R05 Cough: Secondary | ICD-10-CM | POA: Diagnosis not present

## 2017-02-10 DIAGNOSIS — Z9989 Dependence on other enabling machines and devices: Secondary | ICD-10-CM

## 2017-02-10 MED ORDER — TRAMADOL HCL 50 MG PO TABS: ORAL_TABLET | ORAL | 0 refills | Status: DC

## 2017-02-10 MED ORDER — PREDNISONE 10 MG PO TABS: ORAL_TABLET | ORAL | 0 refills | Status: DC

## 2017-02-10 NOTE — Progress Notes (Signed)
Subjective:    Patient ID: Kenneth Fuller, male    DOB: 1945/12/22, 72 y.o.   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  He is here for shortness of breath and cough.  History is somewhat poor and is gathered by talking to him and review of the past medical history in chart.  As best as I can gather he is to have asthma as a child but then he says he outgrew it.  For the last 5 years prior to OV    he is on Advair and Singulair for shortness of breath although he says specifically it is not meant to be for asthma or COPD.  However he also tells me that he is only been symptomatic for the last year or so.  He had insidious onset of shortness of breath for the last 1 year progressively worse.  Insidious onset of cough for the last 6 months and progressively worse.  Cough/breathing  worse when lie down   he is coughing all the time it is a dry cough in quality min  Green  Mucus.  He gets short of breath changing clothes and doing minimal activities class III exertion.  Relieved by rest no associated chest pain.  Chart review shows that he has had cardiac workup.  Echocardiogram October 2018 showed grade 2 diastolic dysfunction.  He then underwent cardiac catheterization October 2018 that was nonobstructive coronary artery disease.  Walking desaturation test on 01/26/2017 185 feet x 3 laps on ROOM AIR:  did not desaturate. Rest pulse ox was 99%, final pulse ox was 97%. HR response 67/min at rest to 81/min at peak exertion. Patient [name]  no Desaturate < 88% . [name] did not  Desaturated </= 3% points. [name] did not get tachyardic.  He did get pulmonary function test on December 23, 2016 that shows restriction with reduced diffusion capacity.  Total lung capacity 72% FVC, 1.6 L / 40% and DLCO of 19.4/68%.  Feno 01/26/2017 ->  8ppb       Of  note in October 2017 he was diagnosed to have very severe sleep apnea with apnea hypotony index of greater than 100/h and with desaturations.[name]  He is being managed by Dr. Ellouise Newer.   He supposed to be using CPAP but he is noncompliant with it due to smothering when on it  rec No change in medications W/u for ILD > neg (see CT below)     02/10/2017 acute extended ov/Heru Montz re: refractory cough >> sob  Chief Complaint  Patient presents with  . Acute Visit    Breathing has gradually been worse since his last visit. He is coughing more. Hard to produce sputum- yellow to green when he does. He has been waking up in the night coughing. He has been having to sleep in his recliner. He is using his albuterol inhaler 3 x per day on average.    cough is 24/7 and very minimally productive, severe fits to point of feeling choked and cannot lie back at all and breath comfortable more due to cough than sob so has been sleeping in recliner Insists onset was x 5 y and steadily downhill, no better on Advair hfa   No obvious day to day or daytime variability or assoc excess  sputum or mucus plugs or  hemoptysis or cp or chest tightness, subjective wheeze or overt sinus or hb symptoms. No unusual exposure hx or h/o childhood pna/ asthma or knowledge of premature birth.    Also denies any obvious fluctuation of symptoms with weather or environmental changes or other aggravating or alleviating factors except as outlined above   Current Allergies, Complete Past Medical History, Past Surgical History, Family History, and Social History were reviewed in [facility] record.  ROS  The following are not active complaints unless bolded Hoarseness, sore throat, dysphagia, dental problems, itching, sneezing,  nasal congestion or discharge of excess mucus or purulent secretions, ear ache,   fever, chills, sweats, unintended wt loss or wt gain, classically pleuritic or exertional cp,  orthopnea pnd or leg  swelling, presyncope, palpitations, abdominal pain, anorexia, nausea, vomiting, diarrhea  or change in bowel habits or change in bladder habits, change in stools or change in urine, dysuria, hematuria,  rash, arthralgias, visual complaints, headache, numbness, weakness or ataxia or problems with walking or coordination,  change in mood/affect or memory.        Current Meds  Medication Sig  . albuterol (PROVENTIL HFA;VENTOLIN HFA) 108 (90 Base) MCG/ACT inhaler Inhale 2 puffs every 6 (six) hours as needed into the lungs for wheezing or shortness of breath.  [name] amLODipine (NORVASC) 5 MG tablet Take 1 tablet (5 mg total) daily by mouth.  [name] atorvastatin (LIPITOR) 40 MG tablet TAKE 1 TABLET BY MOUTH ONCE DAILY  . bisoprolol (ZEBETA) 10 MG tablet Take 1 tablet (10 mg total) daily by mouth.  . Blood Glucose Monitoring Suppl (PRODIGY BLOOD GLUCOSE MONITOR) W/DEVICE KIT 1 Units by Does not apply route daily. Check blood sugars 1X per day-- dx 250.02  . Cholecalciferol (VITAMIN D PO) Take 1 tablet by mouth every morning.  . colchicine 0.6 MG tablet Take 1 tablet (0.6 mg total) by mouth daily. (Patient taking differently: Take 0.6 mg by mouth daily as needed (for gout flare ups). )  . cyclobenzaprine (FLEXERIL) 5 MG tablet Take 5 mg by mouth 3 (three) times daily as needed for muscle spasms.  . empagliflozin (JARDIANCE) 10 MG TABS tablet Take 10 mg by mouth daily.  . fluticasone (FLONASE) 50 MCG/ACT nasal spray Place 2 sprays into both nostrils daily. (Patient taking differently: Place 2 sprays into both nostrils daily as needed for allergies. )  . furosemide (LASIX) 40 MG tablet Take 2 tablets (80 mg total) every morning by mouth AND 1 tablet (40 mg total) every evening.  [name] glucose blood test strip Test 1X per day and prn- Dx Code E11.9. One Touch Ultra Test Strips  . metFORMIN (GLUCOPHAGE) 1000 MG tablet Take 1 tablet (1,000 mg total) by mouth 2 (two) times daily with a meal.  . montelukast (SINGULAIR) 10 MG  tablet Take 1 tablet (10 mg total) by mouth at bedtime.  [name] omeprazole (PRILOSEC) 40 MG capsule TAKE 1 CAPSULE BY MOUTH ONCE DAILY  . ONETOUCH DELICA LANCETS 29J MISC 1 each by Does not apply route daily. Test 1X per day and prn - DX 250.02  . potassium chloride (KLOR-CON M10) 10 MEQ tablet Take 1 tablet (10 mEq total) 2 (two) times daily by mouth.  . [  Fluticasone-Salmeterol (ADVAIR DISKUS) 250-50 MCG/DOSE AEPB Inhale 1 puff into the lungs 2 (two) times daily.                          Objective:   Physical Exam  amb obese wm severe dry hacking cough    Wt Readings from Last 3 Encounters:  02/10/17 278 lb (126.1 kg)  01/26/17 281 lb 3.2 oz (127.6 kg)  01/14/17 286 lb 9.6 oz (130 kg)     Vital signs reviewed - Note on arrival 02 sats  95% on RA         HEENT: nl  turbinates bilaterally, and oropharynx. Nl external ear canals without cough reflex - full dentures   NECK :  without JVD/Nodes/TM/ nl carotid upstrokes bilaterally   LUNGS: no acc muscle use,  Nl contour chest distant bs bilaterally   without cough on insp or exp maneuvers   CV:  RRR  no s3 or murmur or increase in P2, and no edema   ABD:  Tensely obese but  nontender with limited  inspiratory excursion in the supine position. No bruits or organomegaly appreciated, bowel sounds nl  MS:  Nl gait/ ext warm without deformities, calf tenderness, cyanosis or clubbing No obvious joint restrictions   SKIN: warm and dry without lesions    NEURO:  alert, approp, nl sensorium with  no motor or cerebellar deficits apparent.     I personally reviewed images and agree with radiology impression as follows:   Chest CT  02/02/17 1. No evidence of interstitial lung disease. 2. Mild air trapping indicative of mild small airways disease            Assessment & Plan:

## 2017-02-10 NOTE — Patient Instructions (Addendum)
Omeprazole 40 mg Take 30- 60 min before your first and last meals of the day   GERD (REFLUX)  is an extremely common cause of respiratory symptoms just like yours , many times with no obvious heartburn at all.    It can be treated with medication, but also with lifestyle changes including elevation of the head of your bed (ideally with 6 inch  bed blocks),  Smoking cessation, avoidance of late meals, excessive alcohol, and avoid fatty foods, chocolate, peppermint, colas, red wine, and acidic juices such as orange juice.  NO MINT OR MENTHOL PRODUCTS SO NO COUGH DROPS   USE SUGARLESS CANDY INSTEAD (Jolley ranchers or Stover's or Life Savers) or even ice chips will also do - the key is to swallow to prevent all throat clearing. NO OIL BASED VITAMINS - use powdered substitutes.   Stop advair for now and we'll consider changing you to low dose symbicort or dulera when you return in 2 weeks   Only use your albuterol as a rescue medication to be used if you can't catch your breath by resting or doing a relaxed purse lip breathing pattern.  - The less you use it, the better it will work when you need it. - Ok to use up to 2 puffs  every 4 hours if you must but call for immediate appointment if use goes up over your usual need - Don't leave home without it !!  (think of it like the spare tire for your car)    Prednisone 10 mg take  4 each am x 2 days,   2 each am x 2 days,  1 each am x 2 days and stop    Take delsym two tsp every 12 hours and supplement if needed with  tramadol 50 mg up to 1-2 every 4 hours to suppress the urge to cough. Swallowing water or using ice chips/non mint and menthol containing candies (such as lifesavers or sugarless jolly ranchers) are also effective.  You should rest your voice and avoid activities that you know make you cough.  Once you have eliminated the cough for 3 straight days try reducing the tramadol first,  then the delsym as tolerated.    Ok to re-try the cpap  once you get the cough under control for  A night or two    Keep appt to see Kenneth Fuller - call sooner if needed       .

## 2017-02-11 ENCOUNTER — Encounter: Payer: Self-pay | Admitting: Internal Medicine

## 2017-02-11 DIAGNOSIS — R058 Other specified cough: Secondary | ICD-10-CM | POA: Insufficient documentation

## 2017-02-11 DIAGNOSIS — R05 Cough: Secondary | ICD-10-CM | POA: Insufficient documentation

## 2017-02-11 NOTE — Assessment & Plan Note (Signed)
rec restart cpap as soon as control noct cough

## 2017-02-11 NOTE — Assessment & Plan Note (Addendum)
Spirometry  12/23/16   FEV1 1.61 (56%)  Ratio 79 p 22% improvement from saba    - Advair dpi d/c'd 02/10/2017 due to uacs component >> asthma   On return in 2 weeks consider low dose ics like dulera 100 or symb 80 / continue singulair and in meantime cover airways also with pred x 6 d  And prn saba

## 2017-02-11 NOTE — Assessment & Plan Note (Signed)
DDX of  difficult airways management almost all start with A and  include Adherence, Ace Inhibitors, Acid Reflux, Active Sinus Disease, Alpha 1 Antitripsin deficiency, Anxiety masquerading as Airways dz,  ABPA,  Allergy(esp in young), Aspiration (esp in elderly), Adverse effects of meds,  Active smokers, A bunch of PE's (a small clot burden can't cause this syndrome unless there is already severe underlying pulm or vascular dz with poor reserve) plus two Bs  = Bronchiectasis and Beta blocker use..and one C= CHF   Adherence is always the initial "prime suspect" and is a multilayered concern that requires a "trust but verify" approach in every patient - starting with knowing how to use medications, especially inhalers, correctly, keeping up with refills and understanding the fundamental difference between maintenance and prns vs those medications only taken for a very short course and then stopped and not refilled.  - return with all meds in hand using a trust but verify approach to confirm accurate Medication  Reconciliation The principal here is that until we are certain that the  patients are doing what we've asked, it makes no sense to ask them to do more.    ? Acid (or non-acid) GERD > always difficult to exclude as up to 75% of pts in some series report no assoc GI/ Heartburn symptoms> rec max (24h)  acid suppression and diet restrictions/ reviewed and instructions given in writing.   ? Adverse effects of dpi > try off   ? Allergy/ asthma > Prednisone 10 mg take  4 each am x 2 days,   2 each am x 2 days,  1 each am x 2 days and stop and re-eval in 2 weeks ? Add back low dose dulera or symb hfa / continue singulair and consider allergy profile next ov    ? Anxiety/depression/ deconditioning > usually at the bottom of this list of usual suspects but should be   higher on this pt's based on H and P     ? BB > very unlikely on low dose bisoprolol, the most selective B1 on market   ? CHF > note  lvedp 20 on 12/02/16 LHC> likely related to obesity with mild diastolic dysfunction > f/u cards   I had an extended discussion with the patient reviewing all relevant studies completed to date and  lasting 25 minutes of a 40  minute acute office  visit with pt new to me    re  severe non-specific but potentially very serious refractory respiratory symptoms of uncertain and potentially multiple  etiologies.   Each maintenance medication was reviewed in detail including most importantly the difference between maintenance and prns and under what circumstances the prns are to be triggered using an action plan format that is not reflected in the computer generated alphabetically organized AVS.    Please see AVS for specific instructions unique to this office visit that I personally wrote and verbalized to the the pt in detail and then reviewed with pt  by my nurse highlighting any changes in therapy/plan of care  recommended at today's visit.

## 2017-02-11 NOTE — Assessment & Plan Note (Signed)
Body mass index is 43.54 kg/m.  -    Lab Results  Component Value Date   TSH 2.300 08/21/2015     Contributing to gerd risk/ doe/reviewed the need and the process to achieve and maintain neg calorie balance > defer f/u primary care including intermittently monitoring thyroid status

## 2017-02-15 NOTE — Telephone Encounter (Signed)
ONO 01/28/17 -> on RA </ = 88% is 5h 39 minutes. STart 2LNC at night   Dr. Brand Males, M.D., [facility].C.P Pulmonary and Critical Care Medicine Staff Physician, [place] Director - Interstitial Lung Disease  Program  P[facility] [place] at [place], [place], [address]  Pager: [phone], If no answer or between  15:00h - 7:00h: call [phone] Telephone: [phone]

## 2017-02-15 NOTE — Telephone Encounter (Signed)
Order placed for nocturnal O2.  Nothing further needed.

## 2017-02-15 NOTE — Telephone Encounter (Signed)
MR please advise. Thanks! 

## 2017-02-16 DIAGNOSIS — R0600 Dyspnea, unspecified: Secondary | ICD-10-CM | POA: Diagnosis not present

## 2017-02-16 DIAGNOSIS — R0902 Hypoxemia: Secondary | ICD-10-CM | POA: Diagnosis not present

## 2017-02-17 ENCOUNTER — Ambulatory Visit: Payer: PPO | Admitting: Acute Care

## 2017-02-17 DIAGNOSIS — E119 Type 2 diabetes mellitus without complications: Secondary | ICD-10-CM | POA: Diagnosis not present

## 2017-02-17 DIAGNOSIS — H40033 Anatomical narrow angle, bilateral: Secondary | ICD-10-CM | POA: Diagnosis not present

## 2017-02-17 LAB — HM DIABETES EYE EXAM

## 2017-02-22 ENCOUNTER — Telehealth: Payer: Self-pay

## 2017-02-22 NOTE — Telephone Encounter (Signed)
Getting a prior auth for Kenneth Glade  Do not see this med on his med list??????

## 2017-02-22 NOTE — Telephone Encounter (Signed)
It was on his past medical list and was ordered in 02/05/2016 last, I do not know why it showing up for prior off now.

## 2017-02-25 ENCOUNTER — Ambulatory Visit (INDEPENDENT_AMBULATORY_CARE_PROVIDER_SITE_OTHER): Payer: PPO | Admitting: Internal Medicine

## 2017-02-25 ENCOUNTER — Encounter: Payer: Self-pay | Admitting: Internal Medicine

## 2017-02-25 ENCOUNTER — Other Ambulatory Visit (INDEPENDENT_AMBULATORY_CARE_PROVIDER_SITE_OTHER): Payer: PPO

## 2017-02-25 ENCOUNTER — Ambulatory Visit: Payer: PPO | Admitting: Internal Medicine

## 2017-02-25 VITALS — BP 142/90 | HR 88 | Ht 67.0 in | Wt 281.0 lb

## 2017-02-25 DIAGNOSIS — J9612 Chronic respiratory failure with hypercapnia: Secondary | ICD-10-CM | POA: Diagnosis not present

## 2017-02-25 DIAGNOSIS — R0609 Other forms of dyspnea: Secondary | ICD-10-CM

## 2017-02-25 DIAGNOSIS — R058 Other specified cough: Secondary | ICD-10-CM

## 2017-02-25 DIAGNOSIS — J9621 Acute and chronic respiratory failure with hypoxia: Secondary | ICD-10-CM | POA: Insufficient documentation

## 2017-02-25 DIAGNOSIS — J45991 Cough variant asthma: Secondary | ICD-10-CM

## 2017-02-25 DIAGNOSIS — R05 Cough: Secondary | ICD-10-CM

## 2017-02-25 DIAGNOSIS — J9611 Chronic respiratory failure with hypoxia: Secondary | ICD-10-CM | POA: Diagnosis not present

## 2017-02-25 DIAGNOSIS — R059 Cough, unspecified: Secondary | ICD-10-CM

## 2017-02-25 LAB — BASIC METABOLIC PANEL
BUN: 21 mg/dL (ref 6–23)
CALCIUM: 9.5 mg/dL (ref 8.4–10.5)
CO2: 35 mEq/L — ABNORMAL HIGH (ref 19–32)
Chloride: 95 mEq/L — ABNORMAL LOW (ref 96–112)
Creatinine, Ser: 1.12 mg/dL (ref 0.40–1.50)
GFR: 68.73 mL/min (ref >60.00)
GLUCOSE: 173 mg/dL — AB (ref 70–99)
Potassium: 4.2 mEq/L (ref 3.5–5.1)
Sodium: 138 mEq/L (ref 135–145)

## 2017-02-25 LAB — PULMONARY FUNCTION TEST
FEF 25-75 PRE: 0.55 L/s
FEF 25-75 Post: 0.83 L/sec
FEF2575-%Change-Post: 51 %
FEF2575-%PRED-POST: 38 %
FEF2575-%Pred-Pre: 25 %
FEV1-%Change-Post: 8 %
FEV1-%PRED-POST: 45 %
FEV1-%Pred-Pre: 41 %
FEV1-POST: 1.29 L
FEV1-PRE: 1.19 L
FEV1FVC-%Change-Post: 1 %
FEV1FVC-%Pred-Pre: 91 %
FEV6-%CHANGE-POST: 7 %
FEV6-%PRED-POST: 51 %
FEV6-%Pred-Pre: 47 %
FEV6-PRE: 1.73 L
FEV6-Post: 1.87 L
FEV6FVC-%CHANGE-POST: 0 %
FEV6FVC-%PRED-PRE: 104 %
FEV6FVC-%Pred-Post: 105 %
FVC-%CHANGE-POST: 6 %
FVC-%Pred-Post: 48 %
FVC-%Pred-Pre: 45 %
FVC-Post: 1.89 L
FVC-Pre: 1.77 L
POST FEV1/FVC RATIO: 68 %
POST FEV6/FVC RATIO: 99 %
Pre FEV1/FVC ratio: 67 %
Pre FEV6/FVC Ratio: 98 %

## 2017-02-25 LAB — NITRIC OXIDE: Nitric Oxide: 12

## 2017-02-25 LAB — BRAIN NATRIURETIC PEPTIDE: Pro B Natriuretic peptide (BNP): 97 pg/mL (ref 0.0–100.0)

## 2017-02-25 LAB — TSH: TSH: 3.3 u[IU]/mL (ref 0.35–4.50)

## 2017-02-25 MED ORDER — GABAPENTIN 100 MG PO CAPS: 100.0000 mg | ORAL_CAPSULE | Freq: Three times a day (TID) | ORAL | 2 refills | Status: DC

## 2017-02-25 MED ORDER — TRAMADOL HCL 50 MG PO TABS: ORAL_TABLET | ORAL | 0 refills | Status: DC

## 2017-02-25 NOTE — Assessment & Plan Note (Signed)
Spirometry  12/23/16   FEV1 1.61 (56%)  Ratio 79 p 22% improvement from saba    - Advair dpi d/c'd 02/10/2017 due to uacs component >> asthma  - Spirometry 02/25/2017  FEV1 1.29 (45%)  Ratio 68 s prior rx with mild curvature off advair x 2 weeks with 8% response to saba   He does appear to have a mild asthmatic component but did not note much change from prednisone and any of the ICS but esp advair can aggravate uacs so will leave off maint rx for now and just use saba prn/ check allergy profile to be complete.

## 2017-02-25 NOTE — Progress Notes (Signed)
Spirometry pre and post done today. 

## 2017-02-25 NOTE — Assessment & Plan Note (Addendum)
Stop advair 02/10/2017 with max rx for cyclical cough > 02/17/3157 f/u ov did not follow protocol  - FENO 02/25/2017  =   12 off inhaled steroids  - Allergy profile 02/25/2017 >    IgE  - Sinus CT 02/25/2017 >>>  - Gabapentin 100 tid 02/25/2017 >>>  Min response to prednisone and no flair or demonstrable reversibile airflow obst off advair are most suggestive of large component here of Upper airway cough syndrome (previously labeled PNDS),  is so named because it's frequently impossible to sort out how much is  CR/sinusitis with freq throat clearing (which can be related to primary GERD)   vs  causing  secondary (" extra esophageal")  GERD from wide swings in gastric pressure that occur with throat clearing, often  promoting self use of mint and menthol lozenges that reduce the lower esophageal sphincter tone and exacerbate the problem further in a cyclical fashion.   These are the same pts (now being labeled as having "irritable larynx syndrome" by some cough centers) who not infrequently have a history of having failed to tolerate ace inhibitors,  dry powder inhalers or biphosphonates or report having atypical/extraesophageal reflux symptoms that don't respond to standard doses of PPI  and are easily confused as having aecopd or asthma flares by even experienced allergists/ pulmonologists (myself included).   Will continue max rx for gerd/ repeat cyclical cough protocol with tramadol, and rx for pnds with 1st gen H1 blockers per guidelines    Also based on 5 years of coughing supporting irritable larynx syndrome rec add now gabapentin 100 tid if tolerates  I had an extended discussion with the patient/wife  reviewing all relevant studies completed to date and  lasting 25 minutes of a 40  Minute office visit addressing severe non-specific but potentially very serious refractory respiratory symptoms of uncertain and potentially multiple  etiologies.   Each maintenance medication was reviewed in detail  including most importantly the difference between maintenance and prns and under what circumstances the prns are to be triggered using an action plan format that is not reflected in the computer generated alphabetically organized AVS.    Please see AVS for specific instructions unique to this office visit that I personally wrote and verbalized to the the pt in detail and then reviewed with pt  by my nurse highlighting any changes in therapy/plan of care  recommended at today's visit.   rec return in 2 weeks with all meds in hand using a trust but verify approach to confirm accurate Medication  Reconciliation The principal here is that until we are certain that the  patients are doing what we've asked, it makes no sense to ask them to do more.

## 2017-02-25 NOTE — Assessment & Plan Note (Signed)
ONO 01/28/17 -> on RA  < 89% x 5h 39 minutes. Start 2LNC at night - HCO3  02/25/2017  = 35   Apparently has cpap also but does not use > main rec is wt loss

## 2017-02-25 NOTE — Progress Notes (Signed)
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  He is here for shortness of breath and cough.  History is somewhat poor and is gathered by talking to him and review of the past medical history in chart.  As best as I can gather he is to have asthma as a child but then he says he outgrew it.  For the last 5 years prior to OV    he is on Advair and Singulair for shortness of breath although he says specifically it is not meant to be for asthma or COPD.  However he also tells me that he is only been symptomatic for the last year or so.  He had insidious onset of shortness of breath for the last 1 year progressively worse.  Insidious onset of cough for the last 6 months and progressively worse.  Cough/breathing  worse when lie down   he is coughing all the time it is a dry cough in quality min  Green  Mucus.  He gets short of breath changing clothes and doing minimal activities class III exertion.  Relieved by rest no associated chest pain.  Chart review shows that he has had cardiac workup.  Echocardiogram October 2018 showed grade 2 diastolic dysfunction.  He then underwent cardiac catheterization October 2018 that was nonobstructive coronary artery disease.  Walking desaturation test on 01/26/2017 185 feet x 3 laps on ROOM AIR:  did not desaturate. Rest pulse ox was 99%, final pulse ox was 97%. HR response 67/min at rest to 81/min at peak exertion. Patient Kenneth Fuller  no Desaturate < 88% . Kenneth Fuller did not  Desaturated </= 3% points. Kenneth Fuller did not get tachyardic.  He did get pulmonary function test on December 23, 2016 that shows restriction with reduced diffusion capacity.  Total lung capacity 72% FVC, 1.6 L / 40% and DLCO of 19.4/68%.  Feno 01/26/2017 ->  8ppb       Of note in  October 2017 he was diagnosed to have very severe sleep apnea with apnea hypotony index of greater than 100/h and with desaturations.[name]  He is being managed by Dr. Ellouise Newer.   He supposed to be using CPAP but he is noncompliant with it due to smothering when on it  rec No change in medications W/u for ILD > neg (see CT below)    ONO 01/28/17 -> on RA </ = 88% is 5h 39 minutes. STart 2LNC at night    02/10/2017 acute extended ov/Kenneth Fuller re: refractory cough >> sob  Chief Complaint  Patient presents with  . Acute Visit    Breathing has gradually been worse since his last visit. He is coughing more. Hard to produce sputum- yellow to green when he does. He has been waking up in the night coughing. He has been having to sleep in his recliner. He is using his albuterol inhaler 3 x per day on average.   cough is 24/7 and very minimally productive, severe fits to point of feeling choked and cannot lie back at all and breath comfortably more due to cough than sob so has been sleeping in recliner @ 60 degrees  Insists onset was x 5 y and steadily downhill, no better on Advair hfa  rec Omeprazole 40 mg Take 30- 60 min before your first and last meals of the day  GERD diet   Stop advair for now and we'll consider changing you to low dose symbicort or dulera when you return in 2 weeks  Only use your albuterol  Prednisone 10 mg take  4 each am x 2 days,   2 each am x 2 days,  1 each am x 2 days and stop  Take delsym two tsp every 12 hours and supplement if needed with  tramadol 50 mg up to 1-2 every 4 hours.   Ok to re-try the cpap once you get the cough under control for  A night or two      02/25/2017 acute extended ov/Kenneth Fuller re:  Chief Complaint  Patient presents with  . Follow-up    Breathing is slightly worse. He is coughing slightly less.  He states he is retaining more fluid. He is using his albuterol inhaler once daily on average.   sleeping better not aware of cough keeping him up p tramadol but  still sleepiing 60 degrees recliner  s noct need for saba Has choking spells >  No  better on alb and no increase in alb since d/c'd advair Min improved with pred > mild leg swelling  Constant sense of pnds daytime   No obvious other patterns in day to day or daytime variability or assoc excess/ purulent sputum or mucus plugs or hemoptysis or cp or chest tightness, subjective wheeze or overt sinus or hb symptoms. No unusual exposure hx or h/o childhood pna/ asthma or knowledge of premature birth.  Sleeping ok at 60 degrees in recliner on 2lpm without nocturnal  or early am exacerbation  of respiratory  c/o's or need for noct saba. Also denies any obvious fluctuation of symptoms with weather or environmental changes or other aggravating or alleviating factors except as outlined above   Current Allergies, Complete Past Medical History, Past Surgical History, Family History, and Social History were reviewed in [employer] record.  ROS  The following are not active complaints unless bolded Hoarseness, sore throat, dysphagia, dental problems, itching, sneezing,  nasal congestion or discharge of excess mucus or purulent secretions, ear ache,   fever, chills, sweats, unintended wt loss or wt gain, classically pleuritic or exertional cp,  orthopnea pnd or leg swelling, presyncope, palpitations, abdominal pain, anorexia, nausea, vomiting, diarrhea  or change in bowel habits or change in bladder habits, change in stools or change in urine, dysuria, hematuria,  rash, arthralgias, visual complaints, headache, numbness, weakness or ataxia or problems with walking or coordination,  change in mood/affect or memory.        Current Meds  Medication Sig  . albuterol (PROVENTIL HFA;VENTOLIN HFA) 108 (90 Base) MCG/ACT inhaler Inhale 2 puffs every 6 (six) hours as needed into the lungs for wheezing or shortness of breath.  [name] amLODipine (NORVASC) 5 MG tablet Take 1 tablet (5 mg total) daily by  mouth.  [name] atorvastatin (LIPITOR) 40 MG tablet TAKE 1 TABLET BY MOUTH ONCE DAILY  . bisoprolol (ZEBETA) 10 MG tablet Take 1 tablet (10 mg total) daily by mouth.  . Blood Glucose Monitoring Suppl (PRODIGY BLOOD GLUCOSE MONITOR) W/DEVICE KIT 1 Units by Does not apply route daily. Check blood sugars 1X per day-- dx 250.02  . Cholecalciferol (VITAMIN D PO) Take 1 tablet by mouth every morning.  . colchicine 0.6 MG tablet Take 1 tablet (0.6 mg total) by mouth daily. (Patient  taking differently: Take 0.6 mg by mouth daily as needed (for gout flare ups). )  . cyclobenzaprine (FLEXERIL) 5 MG tablet Take 5 mg by mouth 3 (three) times daily as needed for muscle spasms.  . fluticasone (FLONASE) 50 MCG/ACT nasal spray Place 2 sprays into both nostrils daily. (Patient taking differently: Place 2 sprays into both nostrils daily as needed for allergies. )  . furosemide (LASIX) 40 MG tablet Take 2 tablets (80 mg total) every morning by mouth AND 1 tablet (40 mg total) every evening.  [name] glucose blood test strip Test 1X per day and prn- Dx Code E11.9. One Touch Ultra Test Strips  . metFORMIN (GLUCOPHAGE) 1000 MG tablet Take 1 tablet (1,000 mg total) by mouth 2 (two) times daily with a meal.  . montelukast (SINGULAIR) 10 MG tablet Take 1 tablet (10 mg total) by mouth at bedtime.  [name] omeprazole (PRILOSEC) 40 MG capsule TAKE 1 CAPSULE BY MOUTH ONCE DAILY (Patient taking differently: 1 twice daily before meals)  . ONETOUCH DELICA LANCETS 13Y MISC 1 each by Does not apply route daily. Test 1X per day and prn - DX 250.02  . OXYGEN 2lpm with sleep  . potassium chloride (KLOR-CON M10) 10 MEQ tablet Take 1 tablet (10 mEq total) 2 (two) times daily by mouth.  . traMADol (ULTRAM) 50 MG tablet 1-2 every 4 hours as needed for cough or pain  .                 Objective:   Physical Exam     amb obese wm extreme voice fatigue/ freq throat cleaing     02/25/2017      281   02/10/17 278 lb (126.1 kg)  01/26/17 281 lb 3.2  oz (127.6 kg)  01/14/17 286 lb 9.6 oz (130 kg)    Vital signs reviewed - Note on arrival 02 sats  92% on RA       HEENT: nl   Oropharynx which is pristine/ upper dentures/ moderate bilateral non-specific turbinate edema  . Nl external ear canals without cough reflex   NECK :  without JVD/Nodes/TM/ nl carotid upstrokes bilaterally   LUNGS: no acc muscle use,  Nl contour chest which is clear to A and P with cough early on inspiration and mild pseudowheeze    CV:  RRR  no s3 or murmur or increase in P2, and only trace bilaterally sym pedal edema   ABD:  Quite obese but soft and nontender with limited  inspiratory excursion in the supine position. No bruits or organomegaly appreciated, bowel sounds nl  MS:  Nl gait/ ext warm without deformities, calf tenderness, cyanosis or clubbing No obvious joint restrictions   SKIN: warm and dry without lesions    NEURO:  alert, approp, nl sensorium with  no motor or cerebellar deficits apparent.      Labs ordered 02/25/2017  Allergy profile    Labs ordered/ reviewed:      Chemistry      Component Value Date/Time   NA 138 02/25/2017 1028   NA 141 11/30/2016 1149   K 4.2 02/25/2017 1028   CL 95 (L) 02/25/2017 1028   CO2 35 (H) 02/25/2017 1028   BUN 21 02/25/2017 1028   BUN 13 11/30/2016 1149   CREATININE 1.12 02/25/2017 1028   CREATININE 1.04 06/12/2012 1232      Component Value Date/Time   CALCIUM 9.5 02/25/2017 1028   ALKPHOS 70 11/29/2016 0800   AST 17 11/29/2016 0800  ALT 23 11/29/2016 0800   BILITOT 0.4 11/29/2016 0800        Lab Results  Component Value Date   WBC 9.2 11/30/2016   HGB 13.7 11/30/2016   HCT 42.0 11/30/2016   MCV 87 11/30/2016   PLT 285 11/30/2016         Lab Results  Component Value Date   TSH 3.30 02/25/2017     Lab Results  Component Value Date   PROBNP 97.0 02/25/2017              Assessment & Plan:

## 2017-02-25 NOTE — Patient Instructions (Addendum)
Start Gabapentin 100 mg three times a day   For drainage / throat tickle try take CHLORPHENIRAMINE  4 mg - take one every 4 hours as needed - available over the counter- may cause drowsiness so start with just a bedtime dose or two and see how you tolerate it before trying in daytime    Take delsym two tsp every 12 hours and supplement if needed with  tramadol 50 mg up to 2 every 4 hours to suppress the urge to cough. Swallowing water or using ice chips/non mint and menthol containing candies (such as lifesavers or sugarless jolly ranchers) are also effective.  You should rest your voice and avoid activities that you know make you cough.  Once you have eliminated the cough for 3 straight days try reducing the tramadol first,  then the delsym as tolerated.   Please see patient coordinator before you leave today  to schedule sinus ct  Please remember to go to the lab department downstairs in the basement  for your tests - we will call you with the results when they are available.      Please schedule a follow up office visit in 2  weeks, sooner if needed  with all medications /inhalers/ solutions in hand so we can verify exactly what you are taking. This includes all medications from all doctors and over the counters and should be separated into two bags as we discussed:  All the automatics "no matter what" meds you take until you see the doctor again vs  Bag # 2 up to you = as neededs  rec revisit issue of cpap next ov

## 2017-02-25 NOTE — Assessment & Plan Note (Signed)
Body mass index is 44.01 kg/m.  -  trending up  Lab Results  Component Value Date   TSH 3.30 02/25/2017     Contributing to gerd risk/ doe/reviewed the need and the process to achieve and maintain neg calorie balance > defer f/u primary care including intermittently monitoring thyroid status

## 2017-02-28 LAB — RESPIRATORY ALLERGY PROFILE REGION II ~~LOC~~
Allergen, Cedar tree, t12: <0.1 kU/L
Allergen, D pternoyssinus,d7: <0.1 kU/L
Allergen, Mouse Urine Protein, e78: <0.1 kU/L
Allergen, Mulberry, t76: <0.1 kU/L
Allergen, Oak,t7: <0.1 kU/L
Allergen, P. notatum, m1: <0.1 kU/L
Aspergillus fumigatus, m3: <0.1 kU/L
Box Elder IgE: <0.1 kU/L
CLASS: 0
CLASS: 0
CLASS: 0
CLASS: 0
CLASS: 0
CLASS: 0
CLASS: 0
CLASS: 0
CLASS: 0
CLASS: 0
Cat Dander: <0.1 kU/L
Class: 0
Class: 0
Class: 0
Class: 0
Class: 0
Class: 0
Class: 0
Class: 0
Class: 0
Class: 0
Class: 0
Class: 0
Class: 0
Class: 0
D. farinae: <0.1 kU/L
Dog Dander: 0.11 kU/L — ABNORMAL HIGH
Elm IgE: <0.1 kU/L
IgE (Immunoglobulin E), Serum: 22 kU/L (ref <=114)
Johnson Grass: <0.1 kU/L
Rough Pigweed  IgE: <0.1 kU/L
Timothy Grass: <0.1 kU/L

## 2017-02-28 LAB — INTERPRETATION:

## 2017-03-09 ENCOUNTER — Ambulatory Visit (HOSPITAL_COMMUNITY)
Admission: RE | Admit: 2017-03-09 | Discharge: 2017-03-09 | Disposition: A | Discharge status: Home / Self Care | Payer: PPO | Source: Ambulatory Visit | Attending: Internal Medicine | Admitting: Internal Medicine

## 2017-03-09 DIAGNOSIS — R058 Other specified cough: Secondary | ICD-10-CM

## 2017-03-09 DIAGNOSIS — R05 Cough: Secondary | ICD-10-CM | POA: Diagnosis not present

## 2017-03-09 DIAGNOSIS — R059 Cough, unspecified: Secondary | ICD-10-CM

## 2017-03-10 NOTE — Progress Notes (Signed)
Spoke with pt and notified of results per Dr. Wert. Pt verbalized understanding and denied any questions. 

## 2017-03-11 ENCOUNTER — Encounter: Payer: Self-pay | Admitting: Adult Health

## 2017-03-11 ENCOUNTER — Ambulatory Visit: Payer: PPO | Admitting: Adult Health

## 2017-03-11 DIAGNOSIS — J45991 Cough variant asthma: Secondary | ICD-10-CM

## 2017-03-11 DIAGNOSIS — R05 Cough: Secondary | ICD-10-CM

## 2017-03-11 DIAGNOSIS — J9611 Chronic respiratory failure with hypoxia: Secondary | ICD-10-CM | POA: Diagnosis not present

## 2017-03-11 DIAGNOSIS — R0609 Other forms of dyspnea: Secondary | ICD-10-CM | POA: Diagnosis not present

## 2017-03-11 DIAGNOSIS — J9612 Chronic respiratory failure with hypercapnia: Secondary | ICD-10-CM

## 2017-03-11 DIAGNOSIS — G4733 Obstructive sleep apnea (adult) (pediatric): Secondary | ICD-10-CM

## 2017-03-11 DIAGNOSIS — R058 Other specified cough: Secondary | ICD-10-CM

## 2017-03-11 DIAGNOSIS — K219 Gastro-esophageal reflux disease without esophagitis: Secondary | ICD-10-CM

## 2017-03-11 DIAGNOSIS — Z9989 Dependence on other enabling machines and devices: Secondary | ICD-10-CM | POA: Diagnosis not present

## 2017-03-11 NOTE — Assessment & Plan Note (Signed)
Continue on cough control regimen with Delsym and tramadol.  Advised on cautious use with tramadol. Continue on sugar control. Patient is tolerating gabapentin and cough is decreasing. Patient will follow-up in 4 weeks.  If cough is improving will consider decreasing dose.

## 2017-03-11 NOTE — Progress Notes (Signed)
_0  ID: Kenneth Fuller, male    DOB: [birth year], 71 y.o.   MRN: [id number]  Chief Complaint  Patient presents with  . Follow-up    asthma     Referring provider: [name], [name], MD  HPI: 72 year old male former smoker followed for cough variant asthma and chronic cough Past medical history significant for Severe OSA -CPAP non compliant   TEST  Echocardiogram October 2018 showed grade 2 diastolic dysfunction  cardiac catheterization October 2018 that was nonobstructive coronary artery disease.  Walking desaturation test on 01/26/2017 185 feet x 3 laps on ROOM AIR:  did not desaturate. Rest pulse ox was 99%, final pulse ox was 97%. HR response 67/min at rest to 81/min at peak exertion   pulmonary function test on December 23, 2016 that shows restriction with reduced diffusion capacity.  Total lung capacity 72% FVC, 1.6 L / 40% and DLCO of 19.4/68%.  [place] 01/26/2017 ->  8ppb    High resolution CT chest February 02 2017- for ILD  CT sinus March 09, 2017 clear sinuses  03/11/2017 Follow up : Cough variant Asthma/Dyspnea  Patient presents for a 2-week follow-up.  Patient was seen in December for a pulmonary consult for progressive shortness of breath.  Patient complains over the last 5 years his breathing has been getting worse.  He gets more short of breath with activity.  He is also had associated severe violent cough that has been worse for the last several months.  Patient was set up for a high resolution CT chest that showed no evidence of interstitial lung disease.  Mild small airway trapping.  Pulmonary function test showed minimal airflow obstruction.  Moderate restriction with reduced diffusing capacity.  Exhaled nitric oxide testing was normal.  Advair was stopped due to upper airway irritation.  A CT sinus was clear.  Patient was seen in the office 2 weeks ago with worsening of symptoms and cough.  Patient was started on Delsym and tramadol for cough control.  He  was started on gabapentin 100 mg 3 times daily to help with severe chronic cough.  Patient says his cough is improved significantly.  However has not resolved.  He has had no change in shortness of breath.  Patient has known very severe sleep apnea with previous sleep study showing AHI greater than 100.  He has been tried on CPAP but says he has not been able to tolerate very well.  He had a split-night study that showed optimal control with CPAP 5-20.  Patient says that he has trouble with with exhalation pressures.  Also mask is not comfortable and feels very dry.  We discussed the importance of controlling his sleep apnea and potential complications from severe sleep apnea.  We looked at several new mask and he would like to retry this.  Patient has known diastolic heart failure.  He is on Lasix daily.  But continues to have lower extremity edema.   Patient is followed by cardiology.  Notes were reviewed.  Patient was changed from metoprolol to bisoprolol in November however patient did not understand instructions and continued on both medications also in December metoprolol was increased from his primary care physician.  We reviewed all his medications organize them into a medication encounter with patient education.  Adjustments were made.  Patient has several medications that are on his MAR that he is not taking.      Allergies  Allergen Reactions  . Amlodipine Besy-Benazepril Hcl Swelling and Other (See  Comments)    Makes tongue swell (lotrel)  . Phenergan [Promethazine Hcl] Other (See Comments)    "I can't remember."  . Hydrocodone Itching    Can tolerate in low doses    Immunization History  Administered Date(s) Administered  . Influenza Whole 10/09/2008  . Influenza, High Dose Seasonal PF 11/29/2016  . Influenza,inj,Quad PF,6+ Mos 12/04/2013, 12/04/2014, 12/23/2015  . Pneumococcal Conjugate-13 12/04/2013    Past Medical History:  Diagnosis Date  . Allergy   . Asthma   .  CAD (coronary artery disease)   . Diabetes mellitus   . Fibromyalgia   . GERD (gastroesophageal reflux disease)   . GI bleeding   . Gout   . Hyperlipidemia   . Hypertension   . Hypogonadism male   . MRSA cellulitis   . Neuropathy   . Obesity   . Shortness of breath dyspnea    with exertion   . Sleep apnea    cpap- 14   . Wheezing    no asthma diagnosis    Tobacco History: Social History   Tobacco Use  Smoking Status Former Smoker  . Last attempt to quit: 02/08/1969  . 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 albuterol (PROVENTIL HFA;VENTOLIN HFA) 108 (90 Base) MCG/ACT inhaler Inhale 2 puffs every 6 (six) hours as needed into the lungs for wheezing or shortness of breath.  [name] atorvastatin (LIPITOR) 40 MG tablet TAKE 1 TABLET BY MOUTH ONCE DAILY  . bisoprolol (ZEBETA) 10 MG tablet Take 1 tablet (10 mg total) daily by mouth.  . Blood Glucose Monitoring Suppl (PRODIGY BLOOD GLUCOSE MONITOR) W/DEVICE KIT 1 Units by Does not apply route daily. Check blood sugars 1X per day-- dx 250.02  . Cholecalciferol (VITAMIN D PO) Take 1 tablet by mouth every morning.  . fluticasone (FLONASE) 50 MCG/ACT nasal spray Place 2 sprays into both nostrils daily. (Patient taking differently: Place 2 sprays into both nostrils daily as needed for allergies. )  . furosemide (LASIX) 40 MG tablet Take 2 tablets (80 mg total) every morning by mouth AND 1 tablet (40 mg total) every evening.  . gabapentin (NEURONTIN) 100 MG capsule Take 1 capsule (100 mg total) by mouth 3 (three) times daily. One three times daily  . glucose blood test strip Test 1X per day and prn- Dx Code E11.9. One Touch Ultra Test Strips  . metFORMIN (GLUCOPHAGE) 1000 MG tablet Take 1 tablet (1,000 mg total) by mouth 2 (two) times daily with a meal.  . omeprazole (PRILOSEC) 40 MG capsule TAKE 1 CAPSULE BY MOUTH ONCE DAILY  (Patient taking differently: 1 twice daily before meals)  . ONETOUCH DELICA LANCETS 00Q MISC 1 each by Does not apply route daily. Test 1X per day and prn - DX 250.02  . OXYGEN 2lpm with sleep  . potassium chloride (KLOR-CON M10) 10 MEQ tablet Take 1 tablet (10 mEq total) 2 (two) times daily by mouth.  . traMADol (ULTRAM) 50 MG tablet 1-2 every 4 hours as needed for cough or pain  . amLODipine (NORVASC) 5 MG tablet Take 1 tablet (5 mg total) daily by mouth. (Patient not taking: Reported on 03/11/2017)  . colchicine 0.6 MG tablet Take 1 tablet (0.6 mg total) by mouth daily. (Patient not taking: Reported on 03/11/2017)  . cyclobenzaprine (FLEXERIL) 5 MG tablet Take 5 mg by mouth 3 (three) times daily as needed for  muscle spasms.  . empagliflozin (JARDIANCE) 10 MG TABS tablet Take 10 mg by mouth daily. (Patient not taking: Reported on 03/11/2017)  . montelukast (SINGULAIR) 10 MG tablet Take 1 tablet (10 mg total) by mouth at bedtime. (Patient not taking: Reported on 03/11/2017)   No facility-administered encounter medications on file as of 03/11/2017.      Review of Systems  Constitutional:   No  weight loss, night sweats,  Fevers, chills, + fatigue, or  lassitude.  HEENT:   No headaches,  Difficulty swallowing,  Tooth/dental problems, or  Sore throat,                No sneezing, itching, ear ache,  +nasal congestion, post nasal drip,   CV:  No chest pain,  Orthopnea, PND, swelling in lower extremities, anasarca, dizziness, palpitations, syncope.   GI  No heartburn, indigestion, abdominal pain, nausea, vomiting, diarrhea, change in bowel habits, loss of appetite, bloody stools.   Resp:    No chest wall deformity  Skin: no rash or lesions.  GU: no dysuria, change in color of urine, no urgency or frequency.  No flank pain, no hematuria   MS:  No joint pain or swelling.  No decreased range of motion.  No back pain.    Physical Exam  BP 136/76 (BP Location: Left Arm, Cuff Size: Large)   Pulse  92   Ht _0  (1.702 m)   Wt 286 lb (129.7 kg)   SpO2 93%   BMI 44.79 kg/m   GEN: A/Ox3; pleasant , NAD, obese    HEENT:  [place]/AT,  EACs-clear, TMs-wnl, NOSE-clear, THROAT-clear, no lesions, no postnasal drip or exudate noted. Class 3 MP airway   NECK:  Supple w/ fair ROM; no JVD; normal carotid impulses w/o bruits; no thyromegaly or nodules palpated; no lymphadenopathy.    RESP  Clear  P & A; w/o, wheezes/ rales/ or rhonchi. no accessory muscle use, no dullness to percussion  CARD:  RRR, no m/r/g, 2+ peripheral edema, pulses intact, no cyanosis or clubbing.  GI:   Soft & nt; nml bowel sounds; no organomegaly or masses detected.   Musco: Warm bil, no deformities or joint swelling noted.   Neuro: alert, no focal deficits noted.    Skin: Warm, no lesions or rashes    Lab Results:  CBC    Component Value Date/Time   WBC 9.2 11/30/2016 1149   WBC 10.8 (H) 12/27/2015 0528   RBC 4.84 11/30/2016 1149   RBC 5.28 12/27/2015 0528   HGB 13.7 11/30/2016 1149   HCT 42.0 11/30/2016 1149   PLT 285 11/30/2016 1149   MCV 87 11/30/2016 1149   MCH 28.3 11/30/2016 1149   MCH 25.9 (L) 12/27/2015 0528   MCHC 32.6 11/30/2016 1149   MCHC 30.9 12/27/2015 0528   RDW 15.8 (H) 11/30/2016 1149   LYMPHSABS 2.0 12/26/2015 1000   LYMPHSABS 2.5 05/02/2015 1045   MONOABS 0.8 12/26/2015 1000   EOSABS 0.2 12/26/2015 1000   EOSABS 0.2 05/02/2015 1045   BASOSABS 0.0 12/26/2015 1000   BASOSABS 0.1 05/02/2015 1045    BMET    Component Value Date/Time   NA 138 02/25/2017 1028   NA 141 11/30/2016 1149   K 4.2 02/25/2017 1028   CL 95 (L) 02/25/2017 1028   CO2 35 (H) 02/25/2017 1028   GLUCOSE 173 (H) 02/25/2017 1028   BUN 21 02/25/2017 1028   BUN 13 11/30/2016 1149   CREATININE 1.12 02/25/2017 1028   CREATININE  1.04 06/12/2012 1232   CALCIUM 9.5 02/25/2017 1028   GFRNONAA 92 11/30/2016 1149   GFRNONAA 74 06/12/2012 1232   GFRAA 107 11/30/2016 1149   GFRAA 86 06/12/2012 1232    BNP      Component Value Date/Time   BNP 72.6 08/21/2015 1352    ProBNP    Component Value Date/Time   PROBNP 97.0 02/25/2017 1028    Imaging: [place] [facility] Cm  Result Date: 03/09/2017 CLINICAL DATA:  Initial evaluation for chronic cough and drainage. EXAM: CT PARANASAL SINUS LIMITED WITHOUT CONTRAST TECHNIQUE: Non-contiguous multidetector CT images of the paranasal sinuses were obtained in a single plane without contrast. COMPARISON:  None. FINDINGS: Partially visualized right frontal sinuses clear. Left frontal sinus appears to be underpneumatized. Visualized ethmoidal air cells are largely clear bilaterally. Sphenoid sinuses are clear. Visualized maxillary sinuses are clear bilaterally. Mild rightward bowing of the nasal septum. No concha bullosa. Nasal cavities otherwise clear. Visualized mastoids and middle ear cavities are well pneumatized and clear. Visualized globes and orbital soft tissues within normal limits. Visualized soft tissues of the face within normal limits. Visualized intracranial contents unremarkable. IMPRESSION: Clear sinuses. Electronically Signed   By: Jeannine Boga M.D.   On: 03/09/2017 15:09     Assessment & Plan:   Chronic respiratory failure with hypoxia and hypercapnia (HCC) likely  OHS  Needs to restart CPAP At bedtime   May need CPAP /BIPAP titration if not able to tolerate  Trial of dreamwear full face -sample given   Plan Patient Instructions  Stop Metoprolol .  Continue on Bisoprolol.  Restart CPAP At bedtime  .  Try to wear for at least 4-6hr each night  Work on healthy weight .  Do not drive if sleepy  Follow med calendar closely and bring to each visit.  Follow up with Dr. Chase Caller  Or Jeromey Kruer in 4 weeks.  Please contact office for sooner follow up if symptoms do not improve or worsen or seek emergency care       Cough variant asthma Mild asthma with severe upper airway cough.  Symptoms did not worsen off of Advair.  In fact  cough has improved on current regimen.  Will continue off of maintenance inhaler for now.  May use albuterol as needed.  Control cough and triggers including reflux and rhinitis. Patient's medications were reviewed today and patient education was given. Computerized medication calendar was adjusted/completed  May consider adding Symbicort or Dulera going forward if needed.  D/c Metoprolol as on Bisoprolol   Plan  Patient Instructions  Stop Metoprolol .  Continue on Bisoprolol.  Restart CPAP At bedtime  .  Try to wear for at least 4-6hr each night  Work on healthy weight .  Do not drive if sleepy  Follow med calendar closely and bring to each visit.  Follow up with Dr. Chase Caller  Or Odena Mcquaid in 4 weeks.  Please contact office for sooner follow up if symptoms do not improve or worsen or seek emergency care       Dyspnea on exertion Chronic dyspnea times 5 years suspect is multifactorial with morbid obesity uncontrolled sleep apnea, asthma and diastolic heart failure. Workup with high resolution CT chest and sinus CT are unrevealing.  Patient does have some restriction on PFT suspect is due to morbid obesity.  There is some mild airflow obstruction consistent with asthma.  Patient has grade 2 diastolic dysfunction this may be contributing to shortness of breath along with deconditioning.  Continue  on diuretics as tolerated.    GERD (gastroesophageal reflux disease) Continue on GERD treatment  OSA on CPAP Severe obstructive sleep apnea with suspected OHS.  Patient will restart CPAP.  May need CPAP titration study.   Plan  Patient Instructions  Stop Metoprolol .  Continue on Bisoprolol.  Restart CPAP At bedtime  .  Try to wear for at least 4-6hr each night  Work on healthy weight .  Do not drive if sleepy  Follow med calendar closely and bring to each visit.  Follow up with Dr. Chase Caller  Or Quianna Avery in 4 weeks.  Please contact office for sooner follow up if symptoms do not improve  or worsen or seek emergency care       Upper airway cough syndrome Continue on cough control regimen with Delsym and tramadol.  Advised on cautious use with tramadol. Continue on sugar control. Patient is tolerating gabapentin and cough is decreasing. Patient will follow-up in 4 weeks.  If cough is improving will consider decreasing dose.     Rexene Edison, NP 03/11/2017

## 2017-03-11 NOTE — Patient Instructions (Addendum)
Stop Metoprolol .  Continue on Bisoprolol.  Restart CPAP At bedtime  .  Try to wear for at least 4-6hr each night  Work on healthy weight .  Do not drive if sleepy  Follow med calendar closely and bring to each visit.  Follow up with Dr. Chase Caller  Or Clemmie Buelna in 4 weeks.  Please contact office for sooner follow up if symptoms do not improve or worsen or seek emergency care

## 2017-03-11 NOTE — Assessment & Plan Note (Signed)
Continue on GERD treatment

## 2017-03-11 NOTE — Assessment & Plan Note (Addendum)
Mild asthma with severe upper airway cough.  Symptoms did not worsen off of Advair.  In fact cough has improved on current regimen.  Will continue off of maintenance inhaler for now.  May use albuterol as needed.  Control cough and triggers including reflux and rhinitis. Patient's medications were reviewed today and patient education was given. Computerized medication calendar was adjusted/completed  May consider adding Symbicort or Dulera going forward if needed.  D/c Metoprolol as on Bisoprolol   Plan  Patient Instructions  Stop Metoprolol .  Continue on Bisoprolol.  Restart CPAP At bedtime  .  Try to wear for at least 4-6hr each night  Work on healthy weight .  Do not drive if sleepy  Follow med calendar closely and bring to each visit.  Follow up with Dr. Chase Caller  Or Kip Kautzman in 4 weeks.  Please contact office for sooner follow up if symptoms do not improve or worsen or seek emergency care

## 2017-03-11 NOTE — Assessment & Plan Note (Signed)
Severe obstructive sleep apnea with suspected OHS.  Patient will restart CPAP.  May need CPAP titration study.   Plan  Patient Instructions  Stop Metoprolol .  Continue on Bisoprolol.  Restart CPAP At bedtime  .  Try to wear for at least 4-6hr each night  Work on healthy weight .  Do not drive if sleepy  Follow med calendar closely and bring to each visit.  Follow up with Dr. Chase Caller  Or Yeriel Mineo in 4 weeks.  Please contact office for sooner follow up if symptoms do not improve or worsen or seek emergency care

## 2017-03-11 NOTE — Assessment & Plan Note (Signed)
Needs to restart CPAP At bedtime   May need CPAP /BIPAP titration if not able to tolerate  Trial of dreamwear full face -sample given   Plan Patient Instructions  Stop Metoprolol .  Continue on Bisoprolol.  Restart CPAP At bedtime  .  Try to wear for at least 4-6hr each night  Work on healthy weight .  Do not drive if sleepy  Follow med calendar closely and bring to each visit.  Follow up with Dr. Chase Caller  Or Mahlik Lenn in 4 weeks.  Please contact office for sooner follow up if symptoms do not improve or worsen or seek emergency care

## 2017-03-11 NOTE — Assessment & Plan Note (Signed)
Chronic dyspnea times 5 years suspect is multifactorial with morbid obesity uncontrolled sleep apnea, asthma and diastolic heart failure. Workup with high resolution CT chest and sinus CT are unrevealing.  Patient does have some restriction on PFT suspect is due to morbid obesity.  There is some mild airflow obstruction consistent with asthma.  Patient has grade 2 diastolic dysfunction this may be contributing to shortness of breath along with deconditioning.  Continue on diuretics as tolerated.

## 2017-03-11 NOTE — Addendum Note (Signed)
Addended by: Della Goo C on: 03/11/2017 11:48 AM   Modules accepted: Orders

## 2017-03-14 NOTE — Progress Notes (Signed)
Thank you for fixing the beta blocker error. I see him back March 5. MCr

## 2017-03-15 ENCOUNTER — Encounter: Payer: Self-pay | Admitting: Family Medicine

## 2017-03-15 ENCOUNTER — Ambulatory Visit (INDEPENDENT_AMBULATORY_CARE_PROVIDER_SITE_OTHER): Payer: PPO | Admitting: Family Medicine

## 2017-03-15 VITALS — BP 186/91 | HR 76 | Temp 97.5°F | Ht 67.0 in | Wt 282.0 lb

## 2017-03-15 DIAGNOSIS — E118 Type 2 diabetes mellitus with unspecified complications: Secondary | ICD-10-CM | POA: Diagnosis not present

## 2017-03-15 DIAGNOSIS — I152 Hypertension secondary to endocrine disorders: Secondary | ICD-10-CM

## 2017-03-15 DIAGNOSIS — E1159 Type 2 diabetes mellitus with other circulatory complications: Secondary | ICD-10-CM

## 2017-03-15 DIAGNOSIS — I10 Essential (primary) hypertension: Secondary | ICD-10-CM | POA: Diagnosis not present

## 2017-03-15 DIAGNOSIS — E785 Hyperlipidemia, unspecified: Secondary | ICD-10-CM | POA: Diagnosis not present

## 2017-03-15 DIAGNOSIS — E1169 Type 2 diabetes mellitus with other specified complication: Secondary | ICD-10-CM

## 2017-03-15 LAB — BAYER DCA HB A1C WAIVED: HB A1C (BAYER DCA - WAIVED): 7.6 % — ABNORMAL HIGH (ref <7.0)

## 2017-03-15 MED ORDER — CHLORTHALIDONE 25 MG PO TABS: 25.0000 mg | ORAL_TABLET | Freq: Every day | ORAL | 3 refills | Status: DC

## 2017-03-15 NOTE — Progress Notes (Signed)
BP (!) 186/91   Pulse 76   Temp (!) 97.5 F (36.4 C) (Oral)   Ht 5' 7" (1.702 m)   Wt 282 lb (127.9 kg)   BMI 44.17 kg/m    Subjective:    Patient ID: Kenneth Fuller, male    DOB: 1946/01/20, 72 y.o.   MRN: [id number]  HPI: Kenneth Fuller is a 72 y.o. male presenting on 03/15/2017 for Discuss medications (recently had visit with [facility], they took him off of Metoprolol because they felt it could be causing some of his breathing problems, he only )   HPI Type 2 diabetes mellitus Patient comes in today for recheck of his diabetes. Patient has been currently taking metformin. Patient is not currently on an ACE inhibitor/ARB because had possible angioedema to an ACE inhibitor. Patient has not seen an ophthalmologist this year. Patient denies any issues with their feet.   Hyperlipidemia Patient is coming in for recheck of his hyperlipidemia. The patient is currently taking Lipitor. They deny any issues with myalgias or history of liver damage from it. They deny any focal numbness or weakness or chest pain.   Hypertension Patient is currently on bisoprolol, he was switched from metoprolol because of his breathing issues, and their blood pressure today is 187/96. Patient denies any lightheadedness or dizziness. Patient denies headaches, blurred vision, chest pains, shortness of breath, or weakness. Denies any side effects from medication and is content with current medication.   Difficulty breathing Patient is still having a lot of difficulty with breathing and especially at night.  He was prescribed CPAP and oxygen and he has been using oxygen at night but says that he cannot use the CPAP because it dries him out.  He is still seeing the pulmonologist and he has an appointment back with them to evaluate this further.  He was also given tramadol to help with his cough.  Relevant past medical, surgical, family and social history reviewed and updated as indicated. Interim  medical history since our last visit reviewed. Allergies and medications reviewed and updated.  Review of Systems  Constitutional: Positive for fatigue. Negative for chills and fever.  Respiratory: Positive for chest tightness and shortness of breath. Negative for cough and wheezing.   Cardiovascular: Negative for chest pain and leg swelling.  Musculoskeletal: Negative for back pain and gait problem.  Skin: Negative for rash.  Neurological: Negative for dizziness, weakness and numbness.  Psychiatric/Behavioral: Positive for sleep disturbance. Negative for self-injury and suicidal ideas.  All other systems reviewed and are negative.   Per HPI unless specifically indicated above   Allergies as of 03/15/2017      Reactions   Amlodipine Besy-benazepril Hcl Swelling, Other (See Comments)   Makes tongue swell (lotrel)   Phenergan [promethazine Hcl] Other (See Comments)   "I can't remember."   Hydrocodone Itching   Can tolerate in low doses      Medication List        Accurate as of 03/15/17 11:25 AM. Always use your most recent med list.          albuterol 108 (90 Base) MCG/ACT inhaler Commonly known as:  PROVENTIL HFA;VENTOLIN HFA Inhale 2 puffs every 6 (six) hours as needed into the lungs for wheezing or shortness of breath.   atorvastatin 40 MG tablet Commonly known as:  LIPITOR TAKE 1 TABLET BY MOUTH ONCE DAILY   bisoprolol 10 MG tablet Commonly known as:  ZEBETA Take 1 tablet (10 mg total) daily  by mouth.   chlorpheniramine 2 MG/5ML syrup Commonly known as:  CHLOR-TRIMETON Take 2 mg by mouth every 4 (four) hours as needed for allergies.   chlorthalidone 25 MG tablet Commonly known as:  HYGROTON Take 1 tablet (25 mg total) by mouth daily.   DELSYM PO Take by mouth as needed.   furosemide 40 MG tablet Commonly known as:  LASIX Take 2 tablets (80 mg total) every morning by mouth AND 1 tablet (40 mg total) every evening.   gabapentin 100 MG capsule Commonly known  as:  NEURONTIN Take 1 capsule (100 mg total) by mouth 3 (three) times daily. One three times daily   glucose blood test strip Test 1X per day and prn- Dx Code E11.9. One Touch Ultra Test Strips   LUMIFY OP Apply to eye.   metFORMIN 1000 MG tablet Commonly known as:  GLUCOPHAGE Take 1 tablet (1,000 mg total) by mouth 2 (two) times daily with a meal.   omeprazole 40 MG capsule Commonly known as:  PRILOSEC TAKE 1 CAPSULE BY MOUTH ONCE DAILY   ONETOUCH DELICA LANCETS 09W Misc 1 each by Does not apply route daily. Test 1X per day and prn - DX 250.02   OXYGEN 2lpm with sleep   potassium chloride 10 MEQ tablet Commonly known as:  KLOR-CON M10 Take 1 tablet (10 mEq total) 2 (two) times daily by mouth.   PRODIGY BLOOD GLUCOSE MONITOR w/Device Kit 1 Units by Does not apply route daily. Check blood sugars 1X per day-- dx 250.02   SYSTANE OP Apply to eye.   traMADol 50 MG tablet Commonly known as:  ULTRAM 1-2 every 4 hours as needed for cough or pain   VITAMIN D PO Take 1 tablet by mouth every morning.          Objective:    BP (!) 187/96   Pulse 76   Temp (!) 97.5 F (36.4 C) (Oral)   Ht 5' 7" (1.702 m)   Wt 282 lb (127.9 kg)   BMI 44.17 kg/m   Wt Readings from Last 3 Encounters:  03/15/17 282 lb (127.9 kg)  03/11/17 286 lb (129.7 kg)  02/25/17 281 lb (127.5 kg)    Physical Exam  Constitutional: He is oriented to person, place, and time. He appears well-developed and well-nourished. No distress.  Morbidly obese  Eyes: Conjunctivae are normal. No scleral icterus.  Neck: Neck supple. No thyromegaly present.  Cardiovascular: Normal rate, regular rhythm, normal heart sounds and intact distal pulses.  No murmur heard. Pulmonary/Chest: Effort normal and breath sounds normal. No respiratory distress. He has no wheezes. He has no rales.  Musculoskeletal: Normal range of motion. He exhibits no edema.  Lymphadenopathy:    He has no cervical adenopathy.  Neurological:  He is alert and oriented to person, place, and time. Coordination normal.  Skin: Skin is warm and dry. No rash noted. He is not diaphoretic.  Psychiatric: He has a normal mood and affect. His behavior is normal.  Nursing note and vitals reviewed.       Assessment & Plan:   Problem List Items Addressed This Visit      Cardiovascular and Mediastinum   Hypertension associated with diabetes ([place])   Relevant Medications   chlorthalidone (HYGROTON) 25 MG tablet   Other Relevant Orders   CMP14+EGFR     Endocrine   Diabetes ([place]) - Primary   Relevant Orders   Bayer DCA Hb A1c Waived   CMP14+EGFR   Hyperlipidemia associated with  type 2 diabetes mellitus (HCC)   Relevant Medications   chlorthalidone (HYGROTON) 25 MG tablet     Other   Morbid obesity due to excess calories ([place])       Follow up plan: Return in about 3 months (around 06/12/2017), or if symptoms worsen or fail to improve, for Recheck diabetes and hypertension.  Counseling provided for all of the vaccine components Orders Placed This Encounter  Procedures  . Bayer DCA Hb A1c Waived  . [place] [name], MD [facility] 03/15/2017, 11:25 AM

## 2017-03-16 ENCOUNTER — Other Ambulatory Visit: Payer: Self-pay | Admitting: *Deleted

## 2017-03-16 MED ORDER — GLIPIZIDE ER 2.5 MG PO TB24: 2.5000 mg | ORAL_TABLET | Freq: Every day | ORAL | 0 refills | Status: DC

## 2017-03-19 DIAGNOSIS — R0600 Dyspnea, unspecified: Secondary | ICD-10-CM | POA: Diagnosis not present

## 2017-03-19 DIAGNOSIS — R0902 Hypoxemia: Secondary | ICD-10-CM | POA: Diagnosis not present

## 2017-03-25 DIAGNOSIS — Z961 Presence of intraocular lens: Secondary | ICD-10-CM | POA: Diagnosis not present

## 2017-03-25 DIAGNOSIS — E119 Type 2 diabetes mellitus without complications: Secondary | ICD-10-CM | POA: Diagnosis not present

## 2017-03-25 DIAGNOSIS — H5203 Hypermetropia, bilateral: Secondary | ICD-10-CM | POA: Diagnosis not present

## 2017-03-25 DIAGNOSIS — H52221 Regular astigmatism, right eye: Secondary | ICD-10-CM | POA: Diagnosis not present

## 2017-03-26 ENCOUNTER — Other Ambulatory Visit: Payer: Self-pay | Admitting: Family Medicine

## 2017-03-30 ENCOUNTER — Other Ambulatory Visit: Payer: Self-pay | Admitting: *Deleted

## 2017-03-30 MED ORDER — ONETOUCH DELICA LANCETS 33G MISC: 1.0000 | Freq: Every day | 2 refills | Status: DC

## 2017-03-30 MED ORDER — GLUCOSE BLOOD VI STRP: ORAL_STRIP | 2 refills | Status: DC

## 2017-03-30 NOTE — Addendum Note (Signed)
Addended by: Antonietta Barcelona D on: 03/30/2017 09:00 AM   Modules accepted: Orders

## 2017-04-07 ENCOUNTER — Encounter: Payer: Self-pay | Admitting: Family Medicine

## 2017-04-07 ENCOUNTER — Ambulatory Visit (INDEPENDENT_AMBULATORY_CARE_PROVIDER_SITE_OTHER): Payer: PPO | Admitting: Family Medicine

## 2017-04-07 VITALS — BP 119/77 | HR 66 | Temp 97.7°F | Ht 67.0 in | Wt 276.0 lb

## 2017-04-07 DIAGNOSIS — H1031 Unspecified acute conjunctivitis, right eye: Secondary | ICD-10-CM | POA: Diagnosis not present

## 2017-04-07 DIAGNOSIS — I152 Hypertension secondary to endocrine disorders: Secondary | ICD-10-CM

## 2017-04-07 DIAGNOSIS — I10 Essential (primary) hypertension: Secondary | ICD-10-CM | POA: Diagnosis not present

## 2017-04-07 DIAGNOSIS — E1159 Type 2 diabetes mellitus with other circulatory complications: Secondary | ICD-10-CM | POA: Diagnosis not present

## 2017-04-07 DIAGNOSIS — E118 Type 2 diabetes mellitus with unspecified complications: Secondary | ICD-10-CM

## 2017-04-07 MED ORDER — GLIPIZIDE ER 5 MG PO TB24: 5.0000 mg | ORAL_TABLET | Freq: Every day | ORAL | 1 refills | Status: DC

## 2017-04-07 MED ORDER — POLYMYXIN B-TRIMETHOPRIM 10000-0.1 UNIT/ML-% OP SOLN: 1.0000 [drp] | OPHTHALMIC | 0 refills | Status: DC

## 2017-04-07 MED ORDER — LOSARTAN POTASSIUM 50 MG PO TABS: 25.0000 mg | ORAL_TABLET | Freq: Every day | ORAL | 1 refills | Status: DC

## 2017-04-07 NOTE — Progress Notes (Signed)
BP 119/77   Pulse 66   Temp 97.7 F (36.5 C) (Oral)   Ht 5' 7" (1.702 m)   Wt 276 lb (125.2 kg)   BMI 43.23 kg/m    Subjective:    Patient ID: Kenneth Fuller, male    DOB: 02-20-1945, 72 y.o.   MRN: [id number]  HPI: Kenneth Fuller is a 72 y.o. male presenting on 04/07/2017 for Irritation in right eye and Hypertension, elevated glucose (patient was taking Metoprolol and Bisoprolol both, was seen at pulmonologist on 03/11/17, who recommended he d/c the metoprolol because it could be causing his cough.  patient reports the cough has improved since making this change, however, his blood pressure and glucose have both been elevated.)   HPI  Right eye irritation Pt reports right eye irritation x 1 week. Feels like something is in his eye. Has itching and moderate pain. Has some photophobia as well. Left eye is mildly irritated. He states he wakes up with some matting and also has white discharge. No blurred vision or contact use. Has been using visine and other eye drops, which have not helped. Will try abx eye drops.  Elevated HTN Pt was taking Metoprolol, then was seen at pulmonologist and was told to d/c the medication d/t a persistent dry cough. He was placed on Bisoprolol and the cough has resolved. However, he has had elevated blood pressure since then- running around 160/95 at home. Reports he is taking all of his BP meds-- Bisoprolol and Chlorthaladone and it remains elevated since the change in medications. Will add Losartan.  Elevated blood glucose Pt reports elevated blood glucose since one month ago--around the time of switching blood pressure medications. He is worried this is related to the change in meds. He has been taking his blood sugars and they are around 164-311 and this is very concerning to him. Prior to this, he was not checking his sugars very often because he thinks they were good--around 120. His last A1C was 3 weeks ago and was 7.6. He states he takes Metformin  and Glipizide as instructed. Discussed possibility of beta cell burn out with glipizide. Will trial increase of glipizide to 5 mg and reassess at next visit.   Relevant past medical, surgical, family and social history reviewed and updated as indicated. Interim medical history since our last visit reviewed. Allergies and medications reviewed and updated.  Review of Systems  Constitutional: Negative for activity change, appetite change, chills, fever and unexpected weight change (has lost ~8#--watching wt more closely d/t elevated blood sugars).  HENT: Negative for congestion, ear discharge, ear pain, rhinorrhea and sore throat.   Eyes: Positive for photophobia (right eye), pain (right eye), discharge (right eye clear and white discharge and matting), redness (right eye) and itching (bilateral). Negative for visual disturbance.  Respiratory: Negative for cough, shortness of breath and wheezing.   Endocrine: Negative for polydipsia, polyphagia and polyuria.  Neurological: Negative for dizziness, syncope and light-headedness.    Per HPI unless specifically indicated above   Allergies as of 04/07/2017      Reactions   Amlodipine Besy-benazepril Hcl Swelling, Other (See Comments)   Makes tongue swell (lotrel)   Phenergan [promethazine Hcl] Other (See Comments)   "I can't remember."   Hydrocodone Itching   Can tolerate in low doses      Medication List        Accurate as of 04/07/17 11:15 AM. Always use your most recent med list.  albuterol 108 (90 Base) MCG/ACT inhaler Commonly known as:  PROVENTIL HFA;VENTOLIN HFA Inhale 2 puffs every 6 (six) hours as needed into the lungs for wheezing or shortness of breath.   atorvastatin 40 MG tablet Commonly known as:  LIPITOR TAKE 1 TABLET BY MOUTH ONCE DAILY   bisoprolol 10 MG tablet Commonly known as:  ZEBETA Take 1 tablet (10 mg total) daily by mouth.   chlorpheniramine 2 MG/5ML syrup Commonly known as:  CHLOR-TRIMETON Take  2 mg by mouth every 4 (four) hours as needed for allergies.   chlorthalidone 25 MG tablet Commonly known as:  HYGROTON Take 1 tablet (25 mg total) by mouth daily.   DELSYM PO Take by mouth as needed.   furosemide 40 MG tablet Commonly known as:  LASIX Take 2 tablets (80 mg total) every morning by mouth AND 1 tablet (40 mg total) every evening.   gabapentin 100 MG capsule Commonly known as:  NEURONTIN Take 1 capsule (100 mg total) by mouth 3 (three) times daily. One three times daily   glipiZIDE 5 MG 24 hr tablet Commonly known as:  GLUCOTROL XL Take 1 tablet (5 mg total) by mouth daily with breakfast.   glucose blood test strip Test 1X per day and prn One Touch Ultra test strips   losartan 50 MG tablet Commonly known as:  COZAAR Take 0.5 tablets (25 mg total) by mouth daily.   LUMIFY OP Apply to eye.   metFORMIN 1000 MG tablet Commonly known as:  GLUCOPHAGE Take 1 tablet (1,000 mg total) by mouth 2 (two) times daily with a meal.   omeprazole 40 MG capsule Commonly known as:  PRILOSEC TAKE 1 CAPSULE BY MOUTH ONCE DAILY   ONETOUCH DELICA LANCETS 23J Misc 1 each by Does not apply route daily. Test 1X per day and prn   OXYGEN 2lpm with sleep   potassium chloride 10 MEQ tablet Commonly known as:  KLOR-CON M10 Take 1 tablet (10 mEq total) 2 (two) times daily by mouth.   PRODIGY BLOOD GLUCOSE MONITOR w/Device Kit 1 Units by Does not apply route daily. Check blood sugars 1X per day-- dx 250.02   SYSTANE OP Apply to eye.   traMADol 50 MG tablet Commonly known as:  ULTRAM 1-2 every 4 hours as needed for cough or pain   trimethoprim-polymyxin b ophthalmic solution Commonly known as:  POLYTRIM Place 1 drop into the right eye every 4 (four) hours. Use for 7 days   VITAMIN D PO Take 1 tablet by mouth every morning.          Objective:    BP 119/77   Pulse 66   Temp 97.7 F (36.5 C) (Oral)   Ht _0  (1.702 m)   Wt 276 lb (125.2 kg)   BMI 43.23 kg/m     Wt Readings from Last 3 Encounters:  04/07/17 276 lb (125.2 kg)  03/15/17 282 lb (127.9 kg)  03/11/17 286 lb (129.7 kg)    Physical Exam  Constitutional: He is oriented to person, place, and time. He appears well-developed and well-nourished.  HENT:  Mouth/Throat: Oropharynx is clear and moist and mucous membranes are normal.  Eyes: EOM are normal. Right eye exhibits discharge (clear). Right eye exhibits no hordeolum. No foreign body present in the right eye. Left eye exhibits no discharge. Right conjunctiva is injected (mildly). Left conjunctiva is not injected.  Cardiovascular: Normal rate, regular rhythm and normal heart sounds.  Pulmonary/Chest: Effort normal and breath sounds normal.  Lymphadenopathy:  He has no cervical adenopathy.  Neurological: He is alert and oriented to person, place, and time.  Vitals reviewed.     Assessment & Plan:   Problem List Items Addressed This Visit      Cardiovascular and Mediastinum   Hypertension associated with diabetes ([place])   Relevant Medications   glipiZIDE (GLUCOTROL XL) 5 MG 24 hr tablet   losartan (COZAAR) 50 MG tablet     Endocrine   Diabetes (HCC)   Relevant Medications   glipiZIDE (GLUCOTROL XL) 5 MG 24 hr tablet   losartan (COZAAR) 50 MG tablet    Other Visit Diagnoses    Acute conjunctivitis of right eye, unspecified acute conjunctivitis type    -  Primary   Relevant Medications   trimethoprim-polymyxin b (POLYTRIM) ophthalmic solution     Add losartan 25 mg tab for elevated blood pressure, and beneficial for DM. Continue bisoprolol and chlorthalidone. Monitor BP daily. Increase glipizide to 5 mg daily for elevated blood glucose levels. Monitor for symptoms of low blood sugars. Continue metformin.  Order polytrim for right eye bacterial conjunctivitis.  Follow up plan: Return in about 4 weeks (around 05/05/2017), or if symptoms worsen or fail to improve, for Recheck hypertension and diabetes.  Counseling provided  for all of the vaccine components No orders of the defined types were placed in this encounter.   Patient was seen and examined with Chaney Malling PA student, agree with assessment and plan above. Caryl Pina, MD [facility] 04/12/2017, 6:03 PM

## 2017-04-08 ENCOUNTER — Ambulatory Visit: Payer: PPO | Admitting: Adult Health

## 2017-04-08 ENCOUNTER — Telehealth: Payer: Self-pay | Admitting: Family Medicine

## 2017-04-08 ENCOUNTER — Encounter: Payer: Self-pay | Admitting: Adult Health

## 2017-04-08 VITALS — BP 132/78 | HR 73 | Ht 67.0 in | Wt 273.8 lb

## 2017-04-08 DIAGNOSIS — H1031 Unspecified acute conjunctivitis, right eye: Secondary | ICD-10-CM

## 2017-04-08 DIAGNOSIS — J45991 Cough variant asthma: Secondary | ICD-10-CM | POA: Diagnosis not present

## 2017-04-08 DIAGNOSIS — G4733 Obstructive sleep apnea (adult) (pediatric): Secondary | ICD-10-CM | POA: Diagnosis not present

## 2017-04-08 DIAGNOSIS — Z9989 Dependence on other enabling machines and devices: Secondary | ICD-10-CM | POA: Diagnosis not present

## 2017-04-08 DIAGNOSIS — J9611 Chronic respiratory failure with hypoxia: Secondary | ICD-10-CM

## 2017-04-08 DIAGNOSIS — J9612 Chronic respiratory failure with hypercapnia: Secondary | ICD-10-CM

## 2017-04-08 MED ORDER — BUDESONIDE-FORMOTEROL FUMARATE 80-4.5 MCG/ACT IN AERO: 2.0000 | INHALATION_SPRAY | Freq: Two times a day (BID) | RESPIRATORY_TRACT | 5 refills | Status: DC

## 2017-04-08 MED ORDER — BUDESONIDE-FORMOTEROL FUMARATE 80-4.5 MCG/ACT IN AERO: 2.0000 | INHALATION_SPRAY | Freq: Two times a day (BID) | RESPIRATORY_TRACT | 0 refills | Status: DC

## 2017-04-08 NOTE — Patient Instructions (Addendum)
Begin Symbicort 80 2 puffs Twice daily  , rinse after use.  Decrease Gabapentin 19m Twice daily   Restart CPAP At bedtime  .  Try new mask , call back if you need a referral for mask fitting .  Try to wear for at least 4-6hr each night  Work on healthy weight .  Do not drive if sleepy  Follow up with Dr. RChase Callerin 2  months and As needed   Please contact office for sooner follow up if symptoms do not improve or worsen or seek emergency care

## 2017-04-08 NOTE — Assessment & Plan Note (Addendum)
Moderate persistent asthma with upper airway cough. Improved control with upper airway cough on gabapentin we will decrease this to twice daily.  Continue on cough control regimen.  Discontinuation of Advair with suspected upper airway irritation.  Would like to restart patient on asthma controller.  We will try Symbicort   Plan  Patient Instructions  Begin Symbicort 80 2 puffs Twice daily  , rinse after use.  Decrease Gabapentin 160m Twice daily   Restart CPAP At bedtime  .  Try new mask , call back if you need a referral for mask fitting .  Try to wear for at least 4-6hr each night  Work on healthy weight .  Do not drive if sleepy  Follow up with Dr. RChase Callerin 2  months and As needed   Please contact office for sooner follow up if symptoms do not improve or worsen or seek emergency care

## 2017-04-08 NOTE — Assessment & Plan Note (Signed)
She has severe sleep apnea.  Discussed with patient about restarting CPAP.  We will try a new full facemask to see if this is more comfortable.  Plan  Patient Instructions  Begin Symbicort 80 2 puffs Twice daily  , rinse after use.  Decrease Gabapentin 166m Twice daily   Restart CPAP At bedtime  .  Try new mask , call back if you need a referral for mask fitting .  Try to wear for at least 4-6hr each night  Work on healthy weight .  Do not drive if sleepy  Follow up with Dr. RChase Callerin 2  months and As needed   Please contact office for sooner follow up if symptoms do not improve or worsen or seek emergency care

## 2017-04-08 NOTE — Progress Notes (Signed)
_0  ID: Kenneth Fuller, male    DOB: 1946/01/08, 72 y.o.   MRN: [id number]  Chief Complaint  Patient presents with  . Follow-up    OSA     Referring provider: [name], [name], MD  HPI: 72 year old male former smoker followed for cough variant asthma and chronic cough Past medical history significant for Severe OSA -CPAP non compliant   TEST  Echocardiogram October 2018 showed grade 2 diastolic dysfunction  cardiac catheterization October 2018 that was nonobstructive coronary artery disease.  Walking desaturation test on 01/26/2017 185 feet x 3 laps on ROOM AIR: did not desaturate. Rest pulse ox was 99%, final pulse ox was 97%. HR response 67/min at rest to 81/min at peak exertion   pulmonary function test on December 23, 2016 that shows restriction with reduced diffusion capacity. Total lung capacity 72% FVC, 1.6 L / 40% and DLCO of 19.4/68%.  PFT February 25, 2017 showed FEV1 45%, ratio 68, FVC 48% consistent with moderate to severe airflow obstruction  [place] 01/26/2017 ->8ppb   High resolution CT chest February 02 2017- for ILD  CT sinus March 09, 2017 clear sinuses  04/08/2017 Follow up : OSA  Patient returns for a one-month follow-up.  Patient has underlying severe sleep apnea has been having trouble with his CPAP.  Last visit he was changed to a new mask and restarted on CPAP.  Patient says he does not like his new mask and is having trouble with humidity issues and dry mouth.  Discussed various options to help with CPAP compliance.  Patient has ongoing dyspnea over the last 5 years he was set up for high resolution CT chest that showed no evidence of interstitial lung disease.  Some mild small airway trapping.  Pulmonary function test showed moderate to severe airflow obstruction and   Moderate restriction with reduced diffusing capacity.  Exhaled nitric oxide testing was normal previously on Advair with no perceived benefit and potential upper airway  irritation.  A CT sinus was done and was clear.  Patient was started on gabapentin for upper airway cough.  Patient noticed a significant decrease in cough . Cough is still not totally gone.  Denies any fever chest pain orthopnea PND or increased leg swelling.  He was previously on Advair.  He denies any flare cough or wheezing.  Patient says he had childhood asthma quite severe.     Allergies  Allergen Reactions  . Amlodipine Besy-Benazepril Hcl Swelling and Other (See Comments)    Makes tongue swell (lotrel)  . Phenergan [Promethazine Hcl] Other (See Comments)    "I can't remember."  . Hydrocodone Itching    Can tolerate in low doses    Immunization History  Administered Date(s) Administered  . Influenza Whole 10/09/2008  . Influenza, High Dose Seasonal PF 11/29/2016  . Influenza,inj,Quad PF,6+ Mos 12/04/2013, 12/04/2014, 12/23/2015  . Pneumococcal Conjugate-13 12/04/2013    Past Medical History:  Diagnosis Date  . Allergy   . Asthma   . CAD (coronary artery disease)   . Diabetes mellitus   . Fibromyalgia   . GERD (gastroesophageal reflux disease)   . GI bleeding   . Gout   . Hyperlipidemia   . Hypertension   . Hypogonadism male   . MRSA cellulitis   . Neuropathy   . Obesity   . Shortness of breath dyspnea    with exertion   . Sleep apnea    cpap- 14   . Wheezing    no asthma  diagnosis    Tobacco History: Social History   Tobacco Use  Smoking Status Former Smoker  . Last attempt to quit: 02/08/1969  . Years since quitting: 48.1  Smokeless Tobacco Former Air traffic controller given: Not Answered   Outpatient Encounter Medications as of 04/08/2017  Medication Sig  . albuterol (PROVENTIL HFA;VENTOLIN HFA) 108 (90 Base) MCG/ACT inhaler Inhale 2 puffs every 6 (six) hours as needed into the lungs for wheezing or shortness of breath.  [name] atorvastatin (LIPITOR) 40 MG tablet TAKE 1 TABLET BY MOUTH ONCE DAILY  . bisoprolol (ZEBETA) 10 MG tablet Take 1 tablet (10 mg  total) daily by mouth.  . Blood Glucose Monitoring Suppl (PRODIGY BLOOD GLUCOSE MONITOR) W/DEVICE KIT 1 Units by Does not apply route daily. Check blood sugars 1X per day-- dx 250.02  . Brimonidine Tartrate (LUMIFY OP) Apply to eye.  . chlorpheniramine (CHLOR-TRIMETON) 2 MG/5ML syrup Take 2 mg by mouth every 4 (four) hours as needed for allergies.  . chlorthalidone (HYGROTON) 25 MG tablet Take 1 tablet (25 mg total) by mouth daily.  . Cholecalciferol (VITAMIN D PO) Take 1 tablet by mouth every morning.  [name] Dextromethorphan Polistirex (DELSYM PO) Take by mouth as needed.   . furosemide (LASIX) 40 MG tablet Take 2 tablets (80 mg total) every morning by mouth AND 1 tablet (40 mg total) every evening.  . gabapentin (NEURONTIN) 100 MG capsule Take 1 capsule (100 mg total) by mouth 3 (three) times daily. One three times daily  . glipiZIDE (GLUCOTROL XL) 5 MG 24 hr tablet Take 1 tablet (5 mg total) by mouth daily with breakfast.  . glucose blood test strip Test 1X per day and prn One Touch Ultra test strips  . losartan (COZAAR) 50 MG tablet Take 0.5 tablets (25 mg total) by mouth daily.  . metFORMIN (GLUCOPHAGE) 1000 MG tablet Take 1 tablet (1,000 mg total) by mouth 2 (two) times daily with a meal.  . omeprazole (PRILOSEC) 40 MG capsule TAKE 1 CAPSULE BY MOUTH ONCE DAILY  . ONETOUCH DELICA LANCETS 50P MISC 1 each by Does not apply route daily. Test 1X per day and prn  . OXYGEN 2lpm with sleep  . Polyethyl Glycol-Propyl Glycol (SYSTANE OP) Apply to eye.  . potassium chloride (KLOR-CON M10) 10 MEQ tablet Take 1 tablet (10 mEq total) 2 (two) times daily by mouth.  . traMADol (ULTRAM) 50 MG tablet 1-2 every 4 hours as needed for cough or pain  . trimethoprim-polymyxin b (POLYTRIM) ophthalmic solution Place 1 drop into the right eye every 4 (four) hours. Use for 7 days  . budesonide-formoterol (SYMBICORT) 80-4.5 MCG/ACT inhaler Inhale 2 puffs into the lungs 2 (two) times daily.  . budesonide-formoterol  (SYMBICORT) 80-4.5 MCG/ACT inhaler Inhale 2 puffs into the lungs 2 (two) times daily.   No facility-administered encounter medications on file as of 04/08/2017.      Review of Systems  Constitutional:   No  weight loss, night sweats,  Fevers, chills,  +fatigue, or  lassitude.  HEENT:   No headaches,  Difficulty swallowing,  Tooth/dental problems, or  Sore throat,                No sneezing, itching, ear ache,  +nasal congestion, post nasal drip,   CV:  No chest pain,  Orthopnea, PND, swelling in lower extremities, anasarca, dizziness, palpitations, syncope.   GI  No heartburn, indigestion, abdominal pain, nausea, vomiting, diarrhea, change in bowel habits, loss of appetite, bloody stools.  Resp:  .  No wheezing.  No chest wall deformity  Skin: no rash or lesions.  GU: no dysuria, change in color of urine, no urgency or frequency.  No flank pain, no hematuria   MS:  No joint pain or swelling.  No decreased range of motion.  No back pain.    Physical Exam  BP 132/78 (BP Location: Right Arm, Cuff Size: Normal)   Pulse 73   Ht 5' 7" (1.702 m)   Wt 273 lb 12.8 oz (124.2 kg)   SpO2 91%   BMI 42.88 kg/m   GEN: A/Ox3; pleasant , NAD, obese    HEENT:  [place]/AT,  EACs-clear, TMs-wnl, NOSE-clear, THROAT-clear, no lesions, no postnasal drip or exudate noted. Class 2-3 MP airway   NECK:  Supple w/ fair ROM; no JVD; normal carotid impulses w/o bruits; no thyromegaly or nodules palpated; no lymphadenopathy.    RESP  Clear  P & A; w/o, wheezes/ rales/ or rhonchi. no accessory muscle use, no dullness to percussion  CARD:  RRR, no m/r/g, tr  peripheral edema, pulses intact, no cyanosis or clubbing.  GI:   Soft & nt; nml bowel sounds; no organomegaly or masses detected.   Musco: Warm bil, no deformities or joint swelling noted.   Neuro: alert, no focal deficits noted.    Skin: Warm, no lesions or rashes    Lab Results:  CBC    Component Value Date/Time   WBC 9.2 11/30/2016  1149   WBC 10.8 (H) 12/27/2015 0528   RBC 4.84 11/30/2016 1149   RBC 5.28 12/27/2015 0528   HGB 13.7 11/30/2016 1149   HCT 42.0 11/30/2016 1149   PLT 285 11/30/2016 1149   MCV 87 11/30/2016 1149   MCH 28.3 11/30/2016 1149   MCH 25.9 (L) 12/27/2015 0528   MCHC 32.6 11/30/2016 1149   MCHC 30.9 12/27/2015 0528   RDW 15.8 (H) 11/30/2016 1149   LYMPHSABS 2.0 12/26/2015 1000   LYMPHSABS 2.5 05/02/2015 1045   MONOABS 0.8 12/26/2015 1000   EOSABS 0.2 12/26/2015 1000   EOSABS 0.2 05/02/2015 1045   BASOSABS 0.0 12/26/2015 1000   BASOSABS 0.1 05/02/2015 1045    BMET    Component Value Date/Time   NA 138 02/25/2017 1028   NA 141 11/30/2016 1149   K 4.2 02/25/2017 1028   CL 95 (L) 02/25/2017 1028   CO2 35 (H) 02/25/2017 1028   GLUCOSE 173 (H) 02/25/2017 1028   BUN 21 02/25/2017 1028   BUN 13 11/30/2016 1149   CREATININE 1.12 02/25/2017 1028   CREATININE 1.04 06/12/2012 1232   CALCIUM 9.5 02/25/2017 1028   GFRNONAA 92 11/30/2016 1149   GFRNONAA 74 06/12/2012 1232   GFRAA 107 11/30/2016 1149   GFRAA 86 06/12/2012 1232    BNP    Component Value Date/Time   BNP 72.6 08/21/2015 1352    ProBNP    Component Value Date/Time   PROBNP 97.0 02/25/2017 1028    Imaging: No results found.   Assessment & Plan:   Cough variant asthma Moderate persistent asthma with upper airway cough. Improved control with upper airway cough on gabapentin we will decrease this to twice daily.  Continue on cough control regimen.  Discontinuation of Advair with suspected upper airway irritation.  Would like to restart patient on asthma controller.  We will try Symbicort   Plan  Patient Instructions  Begin Symbicort 80 2 puffs Twice daily  , rinse after use.  Decrease Gabapentin 171m Twice daily  Restart CPAP At bedtime  .  Try new mask , call back if you need a referral for mask fitting .  Try to wear for at least 4-6hr each night  Work on healthy weight .  Do not drive if sleepy  Follow  up with Dr. Chase Caller in 2  months and As needed   Please contact office for sooner follow up if symptoms do not improve or worsen or seek emergency care       OSA on CPAP She has severe sleep apnea.  Discussed with patient about restarting CPAP.  We will try a new full facemask to see if this is more comfortable.  Plan  Patient Instructions  Begin Symbicort 80 2 puffs Twice daily  , rinse after use.  Decrease Gabapentin 132m Twice daily   Restart CPAP At bedtime  .  Try new mask , call back if you need a referral for mask fitting .  Try to wear for at least 4-6hr each night  Work on healthy weight .  Do not drive if sleepy  Follow up with Dr. RChase Callerin 2  months and As needed   Please contact office for sooner follow up if symptoms do not improve or worsen or seek emergency care       Chronic respiratory failure with hypoxia and hypercapnia [place] likely  [facility]  Patient is continue on oxygen at bedtime until CPAP can be restarted.     TRexene Edison NP 04/08/2017

## 2017-04-08 NOTE — Assessment & Plan Note (Signed)
Patient is continue on oxygen at bedtime until CPAP can be restarted.

## 2017-04-08 NOTE — Telephone Encounter (Signed)
Go ahead and do referral to ophthalmology, urgent

## 2017-04-08 NOTE — Telephone Encounter (Signed)
Referral placed, patient aware

## 2017-04-11 ENCOUNTER — Other Ambulatory Visit: Payer: Self-pay | Admitting: Adult Health

## 2017-04-11 MED ORDER — TRAMADOL HCL 50 MG PO TABS: ORAL_TABLET | ORAL | 0 refills | Status: DC

## 2017-04-11 NOTE — Telephone Encounter (Signed)
Called pt letting him know we were sending a refill of Tramadol to his preferred pharmacy.  Pt expressed understanding. Called [facility] in [place] and spoke with pharmacist Robin giving verbal refill instructions of pt's med.  Nothing further needed at this current time.

## 2017-04-11 NOTE — Telephone Encounter (Signed)
Per TP: okay to refill #30, no refills, same directions.  Sorry we forgot to do this at the last office visit.  Thanks.

## 2017-04-12 ENCOUNTER — Encounter: Payer: Self-pay | Admitting: Cardiovascular Disease

## 2017-04-12 ENCOUNTER — Ambulatory Visit: Payer: PPO | Admitting: Cardiovascular Disease

## 2017-04-12 VITALS — BP 132/80 | HR 71 | Ht 67.0 in | Wt 275.0 lb

## 2017-04-12 DIAGNOSIS — I2510 Atherosclerotic heart disease of native coronary artery without angina pectoris: Secondary | ICD-10-CM

## 2017-04-12 DIAGNOSIS — E669 Obesity, unspecified: Secondary | ICD-10-CM

## 2017-04-12 DIAGNOSIS — E785 Hyperlipidemia, unspecified: Secondary | ICD-10-CM

## 2017-04-12 DIAGNOSIS — I5032 Chronic diastolic (congestive) heart failure: Secondary | ICD-10-CM | POA: Diagnosis not present

## 2017-04-12 DIAGNOSIS — E1169 Type 2 diabetes mellitus with other specified complication: Secondary | ICD-10-CM

## 2017-04-12 DIAGNOSIS — J449 Chronic obstructive pulmonary disease, unspecified: Secondary | ICD-10-CM

## 2017-04-12 DIAGNOSIS — I10 Essential (primary) hypertension: Secondary | ICD-10-CM

## 2017-04-12 DIAGNOSIS — G4733 Obstructive sleep apnea (adult) (pediatric): Secondary | ICD-10-CM | POA: Diagnosis not present

## 2017-04-12 NOTE — Patient Instructions (Signed)
Dr Croitoru recommends that you schedule a follow-up appointment in 12 months. You will receive a reminder letter in the mail two months in advance. If you don't receive a letter, please call our office to schedule the follow-up appointment.  If you need a refill on your cardiac medications before your next appointment, please call your pharmacy. 

## 2017-04-12 NOTE — Progress Notes (Signed)
Cardiology Office Note    Date:  04/13/2017   ID:  Kenneth Fuller, DOB 1945-09-06, MRN [id number]  PCP:  [name], [name], MD  Cardiologist:   Sanda Klein, MD   Chief Complaint  Patient presents with  . Follow-up    History of Present Illness:  Kenneth Fuller is a 72 y.o. male with morbid obesity, obstructive sleep apnea, hyperlipidemia, diabetes mellitus complicated by neuropathy, minor nonobstructive coronary atherosclerosis, moderately severe restrictive lung disease.   He has preserved left ventricular systolic function and minimal coronary artery disease.  LVEDP was borderline at the time of catheterization (10-14 mmHg).  His echo did show grade 2 diastolic dysfunction.  However pulmonary function test had shown fairly severe obstructive lung disease with FEV1 in the 40-45% of predicted range.  He did not have much improvement with bronchodilators either on the PFTs or clinically.  He does have a history of roughly 5 pack years of smoking, but quit 40 years ago.  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  Chest CT did not show evidence of severe interstitial lung disease.  Overall clinically seems to be doing a little better, although he still has NYHA functional class II dyspnea.  He still has occasional wheezing.  His cough is substantially better after he started treatment with gabapentin, in addition to switching him to bisoprolol as a more selective beta-blocker and a variety of other interventions by his pulmonary specialist.  He does not have orthopnea or lower extremity edema.  The patient specifically denies any chest pain at rest or exertionorthopnea, paroxysmal nocturnal dyspnea, syncope, palpitations, focal neurological deficits, intermittent claudication, lower extremity edema, unexplained weight changes.  He remains very compliant with CPAP.  He is interested in bariatric surgery.   Past Medical History:  Diagnosis Date    . Allergy   . Asthma   . CAD (coronary artery disease)   . Diabetes mellitus   . Fibromyalgia   . GERD (gastroesophageal reflux disease)   . GI bleeding   . Gout   . Hyperlipidemia   . Hypertension   . Hypogonadism male   . MRSA cellulitis   . Neuropathy   . Obesity   . Shortness of breath dyspnea    with exertion   . Sleep apnea    cpap- 14   . Wheezing    no asthma diagnosis    Past Surgical History:  Procedure Laterality Date  . BACK SURGERY    . CARDIAC CATHETERIZATION    . COLONOSCOPY    . LEFT HEART CATH AND CORONARY ANGIOGRAPHY N/A 12/02/2016   Procedure: LEFT HEART CATH AND CORONARY ANGIOGRAPHY;  Surgeon: Jettie Booze, MD;  Location: [facility];  Service: Cardiovascular;  Laterality: N/A;  . LUMBAR LAMINECTOMY/DECOMPRESSION MICRODISCECTOMY N/A 01/02/2014   Procedure: CENTRAL DECOMPRESSION LUMBAR LAMINECTOMY L3-L4, L4-L5;  Surgeon: Tobi Bastos, MD;  Location: [facility];  Service: Orthopedics;  Laterality: N/A;  . neck fusion      Current Medications: Outpatient Medications Prior to Visit  Medication Sig Dispense Refill  . albuterol (PROVENTIL HFA;VENTOLIN HFA) 108 (90 Base) MCG/ACT inhaler Inhale 2 puffs every 6 (six) hours as needed into the lungs for wheezing or shortness of breath. 1 Inhaler 3  . atorvastatin (LIPITOR) 40 MG tablet TAKE 1 TABLET BY MOUTH ONCE DAILY 90 tablet 0  . bisoprolol (ZEBETA) 10 MG tablet Take 1 tablet (10 mg total) daily by mouth. 90 tablet 3  .  Blood Glucose Monitoring Suppl (PRODIGY BLOOD GLUCOSE MONITOR) W/DEVICE KIT 1 Units by Does not apply route daily. Check blood sugars 1X per day-- dx 250.02 1 each 0  . Brimonidine Tartrate (LUMIFY OP) Apply to eye.    . budesonide-formoterol (SYMBICORT) 80-4.5 MCG/ACT inhaler Inhale 2 puffs into the lungs 2 (two) times daily. 1 Inhaler 0  . budesonide-formoterol (SYMBICORT) 80-4.5 MCG/ACT inhaler Inhale 2 puffs into the lungs 2 (two) times daily. 1 Inhaler 5  .  chlorpheniramine (CHLOR-TRIMETON) 2 MG/5ML syrup Take 2 mg by mouth every 4 (four) hours as needed for allergies.    . chlorthalidone (HYGROTON) 25 MG tablet Take 1 tablet (25 mg total) by mouth daily. 30 tablet 3  . Cholecalciferol (VITAMIN D PO) Take 1 tablet by mouth every morning.    [name] Dextromethorphan Polistirex (DELSYM PO) Take by mouth as needed.     . furosemide (LASIX) 40 MG tablet Take 2 tablets (80 mg total) every morning by mouth AND 1 tablet (40 mg total) every evening. 270 tablet 3  . gabapentin (NEURONTIN) 100 MG capsule Take 1 capsule (100 mg total) by mouth 3 (three) times daily. One three times daily 90 capsule 2  . glipiZIDE (GLUCOTROL XL) 5 MG 24 hr tablet Take 1 tablet (5 mg total) by mouth daily with breakfast. 90 tablet 1  . glucose blood test strip Test 1X per day and prn One Touch Ultra test strips 100 each 2  . losartan (COZAAR) 50 MG tablet Take 0.5 tablets (25 mg total) by mouth daily. 60 tablet 1  . metFORMIN (GLUCOPHAGE) 1000 MG tablet Take 1 tablet (1,000 mg total) by mouth 2 (two) times daily with a meal. 180 tablet 1  . omeprazole (PRILOSEC) 40 MG capsule TAKE 1 CAPSULE BY MOUTH ONCE DAILY 90 capsule 1  . ONETOUCH DELICA LANCETS 38V MISC 1 each by Does not apply route daily. Test 1X per day and prn 100 each 2  . OXYGEN 2lpm with sleep    . Polyethyl Glycol-Propyl Glycol (SYSTANE OP) Apply to eye.    . potassium chloride (KLOR-CON M10) 10 MEQ tablet Take 1 tablet (10 mEq total) 2 (two) times daily by mouth. 180 tablet 3  . traMADol (ULTRAM) 50 MG tablet 1-2 every 4 hours as needed for cough or pain 30 tablet 0  . trimethoprim-polymyxin b (POLYTRIM) ophthalmic solution Place 1 drop into the right eye every 4 (four) hours. Use for 7 days 10 mL 0   No facility-administered medications prior to visit.      Allergies:   Amlodipine besy-benazepril hcl; Phenergan [promethazine hcl]; and Hydrocodone   Social History   Socioeconomic History  . Marital status: Married     Spouse name: None  . Number of children: 3  . Years of education: None  . Highest education level: None  Social Needs  . Financial resource strain: None  . Food insecurity - worry: None  . Food insecurity - inability: None  . Transportation needs - medical: None  . Transportation needs - non-medical: None  Occupational History  . 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 Smoking status: Former Smoker    Last attempt to quit: 02/08/1969    Years since quitting: 48.2  . Smokeless tobacco: Former Network engineer and Sexual Activity  . Alcohol use: No    Alcohol/week: 0.0 oz  . Drug use: No  . Sexual activity: None  Other Topics Concern  .  None  Social History Narrative   Drinks caffeine tea occasionally      Family History:  The patient's family history includes Colon cancer in his mother; Deep vein thrombosis in his brother; Diabetes in his father; Drug abuse in his sister; Heart attack in his father; Heart attack (age of onset: 50) in his son; Heart disease in his brother, brother, and father; Heart failure in his sister; Kidney disease in his sister.   ROS:   Please see the history of present illness.    ROS All other systems reviewed and are negative.   PHYSICAL EXAM:   VS:  BP 132/80   Pulse 71   Ht _0  (1.702 m)   Wt 275 lb (124.7 kg)   BMI 43.07 kg/m      General: Alert, oriented x3, no distress, morbidly obese Head: no evidence of trauma, PERRL, EOMI, no exophtalmos or lid lag, no myxedema, no xanthelasma; normal ears, nose and oropharynx Neck: normal jugular venous pulsations and no hepatojugular reflux; brisk carotid pulses without delay and no carotid bruits Chest: clear to auscultation, no signs of consolidation by percussion or palpation, normal fremitus, symmetrical and full respiratory excursions Cardiovascular: normal position and quality of the apical impulse, regular rhythm, normal first and  second heart sounds, no murmurs, rubs or gallops Abdomen: no tenderness or distention, no masses by palpation, no abnormal pulsatility or arterial bruits, normal bowel sounds, no hepatosplenomegaly Extremities: no clubbing, cyanosis or edema; 2+ radial, ulnar and brachial pulses bilaterally; 2+ right femoral, posterior tibial and dorsalis pedis pulses; 2+ left femoral, posterior tibial and dorsalis pedis pulses; no subclavian or femoral bruits Neurological: grossly nonfocal Psych: Normal mood and affect   Wt Readings from Last 3 Encounters:  04/12/17 275 lb (124.7 kg)  04/08/17 273 lb 12.8 oz (124.2 kg)  04/07/17 276 lb (125.2 kg)      Studies/Labs Reviewed:   EKG:  EKG is ordered today.  It shows normal sinus rhythm with low voltage due to obesity.  Normal repolarization.  QTc 421 ms. Recent Labs: 11/29/2016: ALT 23 11/30/2016: Hemoglobin 13.7; Platelets 285 02/25/2017: BUN 21; Creatinine, Ser 1.12; Potassium 4.2; Pro B Natriuretic peptide (BNP) 97.0; Sodium 138; TSH 3.30   Lipid Panel    Component Value Date/Time   CHOL 89 (L) 11/29/2016 0800   CHOL 111 06/12/2012 1232   TRIG 113 11/29/2016 0800   TRIG 141 01/24/2014 1143   TRIG 192 (H) 06/12/2012 1232   HDL 36 (L) 11/29/2016 0800   HDL 38 (L) 01/24/2014 1143   HDL 33 (L) 06/12/2012 1232   CHOLHDL 2.5 11/29/2016 0800   CHOLHDL 4.8 11/22/2006 1712   VLDL 39 11/22/2006 1712   LDLCALC 30 11/29/2016 0800   LDLCALC 27 10/25/2013 1224   LDLCALC 40 06/12/2012 1232    Labs from 11/29/2016 Total cholesterol 89, HDL 36, LDL 30, triglycerides 113, normal liver function tests, hemoglobin 13.7 02/25/2017 Creatinine 1.12, potassium 4.2, BNP 97, TSH 3.3 03/15/2017 Hemoglobin A1c 7.6%  ASSESSMENT:    1. Chronic diastolic heart failure ([place])   2. Chronic obstructive pulmonary disease, unspecified COPD type ([place])   3. OSA (obstructive sleep apnea)   4. Essential hypertension   5. Coronary artery disease involving native coronary  artery of native heart without angina pectoris   6. Dyslipidemia   7. Diabetes mellitus type 2 in obese ([place])   8. Morbid obesity ([place])      PLAN:  In order of problems listed above:  1. CHF:  It is hard to assess his volume status due to obesity, but there is no overt sign of hypervolemia and his BNP is very low.  He does not have orthopnea.  I do not think he requires any additional diuretics.   2. COPD: I guess this diagnosis is appropriate for the findings on his pulmonary function tests, although the mechanism is not entirely clear since he was never a heavy smoker and did not have heavy industrial exposure.  It seems that he is better after he was switched to bisoprolol.  He has obtained relief from his cough and his overall doing better breathing wise.  Followed by Dr. Melvyn Novas and Tammy Parrett 3. OSA: Compliant with the device.  Pulmonary artery pressures have not been evaluated either by echo or by catheterization. 4. HTN: Very good blood pressure control 5. CAD: Repeat coronary angiography showed only minor nonobstructive atherosclerosis, no progression from 2011 6. HLP: HDL will only improve with marked weight loss, but otherwise his lipid profile is pretty good 7. DM: Most recent hemoglobin A1c above target at 7.6 % 8. Morbid obesity is clearly a big part of his problems with shortness of breath.  I was surprised by his mention of bariatric surgery, but this is not entirely out of the realm of possibility.  I think from a cardiac point of view he would tolerate the surgery well, with a low risk for major cardiovascular complications.  I do not know what the bariatric surgeons would think about his age or the pulmonary situation, whether these would be limiting factors.  From a cardiac point of view, I do not have any hesitation to refer him for such procedure.     Medication Adjustments/Labs and Tests Ordered: Current medicines are reviewed at length with the patient today.  Concerns  regarding medicines are outlined above.  Medication changes, Labs and Tests ordered today are listed in the Patient Instructions below. Patient Instructions  Dr Sallyanne Kuster recommends that you schedule a follow-up appointment in 12 months. You will receive a reminder letter in the mail two months in advance. If you don't receive a letter, please call our office to schedule the follow-up appointment.  If you need a refill on your cardiac medications before your next appointment, please call your pharmacy.    Signed, Sanda Klein, MD  04/13/2017 4:46 PM    [facility] [place], [place], [place]  [address] Phone: [phone]; Fax: [phone]

## 2017-04-13 ENCOUNTER — Encounter: Payer: Self-pay | Admitting: Cardiovascular Disease

## 2017-04-13 ENCOUNTER — Other Ambulatory Visit: Payer: PPO

## 2017-04-13 DIAGNOSIS — E1159 Type 2 diabetes mellitus with other circulatory complications: Secondary | ICD-10-CM

## 2017-04-13 DIAGNOSIS — I5032 Chronic diastolic (congestive) heart failure: Secondary | ICD-10-CM | POA: Insufficient documentation

## 2017-04-13 DIAGNOSIS — E118 Type 2 diabetes mellitus with unspecified complications: Secondary | ICD-10-CM | POA: Diagnosis not present

## 2017-04-13 DIAGNOSIS — I152 Hypertension secondary to endocrine disorders: Secondary | ICD-10-CM

## 2017-04-13 DIAGNOSIS — I10 Essential (primary) hypertension: Secondary | ICD-10-CM | POA: Diagnosis not present

## 2017-04-13 LAB — CMP14+EGFR
A/G RATIO: 1.6 (ref 1.2–2.2)
ALT: 26 IU/L (ref 0–44)
AST: 19 IU/L (ref 0–40)
Albumin: 4.2 g/dL (ref 3.5–4.8)
Alkaline Phosphatase: 63 IU/L (ref 39–117)
BUN/Creatinine Ratio: 17 (ref 10–24)
BUN: 25 mg/dL (ref 8–27)
Bilirubin Total: 0.3 mg/dL (ref 0.0–1.2)
CHLORIDE: 93 mmol/L — AB (ref 96–106)
CO2: 31 mmol/L — ABNORMAL HIGH (ref 20–29)
Calcium: 9.9 mg/dL (ref 8.6–10.2)
Creatinine, Ser: 1.51 mg/dL — ABNORMAL HIGH (ref 0.76–1.27)
GFR calc non Af Amer: 46 mL/min/{1.73_m2} — ABNORMAL LOW (ref >59)
GFR, EST AFRICAN AMERICAN: 53 mL/min/{1.73_m2} — AB (ref >59)
GLOBULIN, TOTAL: 2.6 g/dL (ref 1.5–4.5)
Glucose: 168 mg/dL — ABNORMAL HIGH (ref 65–99)
POTASSIUM: 4 mmol/L (ref 3.5–5.2)
SODIUM: 141 mmol/L (ref 134–144)
TOTAL PROTEIN: 6.8 g/dL (ref 6.0–8.5)

## 2017-04-14 ENCOUNTER — Other Ambulatory Visit: Payer: Self-pay | Admitting: *Deleted

## 2017-04-14 ENCOUNTER — Telehealth: Payer: Self-pay | Admitting: Family Medicine

## 2017-04-14 DIAGNOSIS — N289 Disorder of kidney and ureter, unspecified: Secondary | ICD-10-CM

## 2017-04-14 NOTE — Telephone Encounter (Signed)
See lab results documentation. Pt is aware of labs.

## 2017-04-15 DIAGNOSIS — G4733 Obstructive sleep apnea (adult) (pediatric): Secondary | ICD-10-CM | POA: Diagnosis not present

## 2017-04-16 DIAGNOSIS — R0600 Dyspnea, unspecified: Secondary | ICD-10-CM | POA: Diagnosis not present

## 2017-04-16 DIAGNOSIS — R0902 Hypoxemia: Secondary | ICD-10-CM | POA: Diagnosis not present

## 2017-04-18 ENCOUNTER — Other Ambulatory Visit: Payer: PPO

## 2017-04-18 DIAGNOSIS — N289 Disorder of kidney and ureter, unspecified: Secondary | ICD-10-CM | POA: Diagnosis not present

## 2017-04-18 LAB — CMP14+EGFR
A/G RATIO: 1.5 (ref 1.2–2.2)
ALK PHOS: 62 IU/L (ref 39–117)
ALT: 30 IU/L (ref 0–44)
AST: 26 IU/L (ref 0–40)
Albumin: 4.1 g/dL (ref 3.5–4.8)
BILIRUBIN TOTAL: 0.4 mg/dL (ref 0.0–1.2)
BUN / CREAT RATIO: 17 (ref 10–24)
BUN: 31 mg/dL — AB (ref 8–27)
CHLORIDE: 91 mmol/L — AB (ref 96–106)
CO2: 29 mmol/L (ref 20–29)
Calcium: 9.6 mg/dL (ref 8.6–10.2)
Creatinine, Ser: 1.81 mg/dL — ABNORMAL HIGH (ref 0.76–1.27)
GFR calc Af Amer: 43 mL/min/{1.73_m2} — ABNORMAL LOW (ref >59)
GFR calc non Af Amer: 37 mL/min/{1.73_m2} — ABNORMAL LOW (ref >59)
GLUCOSE: 194 mg/dL — AB (ref 65–99)
Globulin, Total: 2.8 g/dL (ref 1.5–4.5)
POTASSIUM: 3.9 mmol/L (ref 3.5–5.2)
SODIUM: 141 mmol/L (ref 134–144)
Total Protein: 6.9 g/dL (ref 6.0–8.5)

## 2017-04-20 ENCOUNTER — Telehealth: Payer: Self-pay | Admitting: Family Medicine

## 2017-04-20 NOTE — Telephone Encounter (Signed)
Refer to lab notes

## 2017-04-25 ENCOUNTER — Telehealth: Payer: Self-pay | Admitting: Family Medicine

## 2017-04-25 MED ORDER — TELMISARTAN 40 MG PO TABS: 40.0000 mg | ORAL_TABLET | Freq: Every day | ORAL | 2 refills | Status: DC

## 2017-04-25 NOTE — Telephone Encounter (Signed)
Please tell the patient that I have sent in Micardis for him

## 2017-04-28 DIAGNOSIS — T1501XA Foreign body in cornea, right eye, initial encounter: Secondary | ICD-10-CM | POA: Diagnosis not present

## 2017-05-06 DIAGNOSIS — S0501XS Injury of conjunctiva and corneal abrasion without foreign body, right eye, sequela: Secondary | ICD-10-CM | POA: Diagnosis not present

## 2017-05-06 DIAGNOSIS — T1501XA Foreign body in cornea, right eye, initial encounter: Secondary | ICD-10-CM | POA: Diagnosis not present

## 2017-05-06 DIAGNOSIS — H04123 Dry eye syndrome of bilateral lacrimal glands: Secondary | ICD-10-CM | POA: Diagnosis not present

## 2017-05-06 DIAGNOSIS — H2513 Age-related nuclear cataract, bilateral: Secondary | ICD-10-CM | POA: Diagnosis not present

## 2017-05-10 ENCOUNTER — Ambulatory Visit (INDEPENDENT_AMBULATORY_CARE_PROVIDER_SITE_OTHER): Payer: PPO | Admitting: Family Medicine

## 2017-05-10 ENCOUNTER — Encounter: Payer: Self-pay | Admitting: Family Medicine

## 2017-05-10 ENCOUNTER — Telehealth: Payer: Self-pay | Admitting: Cardiovascular Disease

## 2017-05-10 VITALS — BP 149/80 | HR 66 | Temp 97.4°F | Ht 67.0 in | Wt 282.0 lb

## 2017-05-10 DIAGNOSIS — I152 Hypertension secondary to endocrine disorders: Secondary | ICD-10-CM

## 2017-05-10 DIAGNOSIS — I10 Essential (primary) hypertension: Secondary | ICD-10-CM

## 2017-05-10 DIAGNOSIS — R0789 Other chest pain: Secondary | ICD-10-CM | POA: Diagnosis not present

## 2017-05-10 DIAGNOSIS — E1159 Type 2 diabetes mellitus with other circulatory complications: Secondary | ICD-10-CM

## 2017-05-10 LAB — BMP8+EGFR
BUN/Creatinine Ratio: 18 (ref 10–24)
BUN: 23 mg/dL (ref 8–27)
CALCIUM: 9.6 mg/dL (ref 8.6–10.2)
CHLORIDE: 98 mmol/L (ref 96–106)
CO2: 29 mmol/L (ref 20–29)
Creatinine, Ser: 1.25 mg/dL (ref 0.76–1.27)
GFR calc Af Amer: 67 mL/min/{1.73_m2} (ref >59)
GFR calc non Af Amer: 58 mL/min/{1.73_m2} — ABNORMAL LOW (ref >59)
GLUCOSE: 115 mg/dL — AB (ref 65–99)
POTASSIUM: 4.8 mmol/L (ref 3.5–5.2)
Sodium: 141 mmol/L (ref 134–144)

## 2017-05-10 LAB — TROPONIN I

## 2017-05-10 MED ORDER — CLONAZEPAM 0.5 MG PO TABS: 0.5000 mg | ORAL_TABLET | Freq: Two times a day (BID) | ORAL | 1 refills | Status: DC | PRN

## 2017-05-10 NOTE — Telephone Encounter (Signed)
Dr Ellyn Hack spoke to Dr [name] . Appointment schedule for patient to follow up appointment on 05/12/17 at 11:30 am with extender Eulas Post PA

## 2017-05-10 NOTE — Telephone Encounter (Signed)
NEW MESSAGE    Dr [name] calling, requesting to speak with nurse/MD regarding patient having chest discomfort and SOB.

## 2017-05-10 NOTE — Progress Notes (Signed)
BP (!) 149/80 (BP Location: Left Arm, Cuff Size: Large)   Pulse 66   Temp (!) 97.4 F (36.3 C) (Oral)   Ht _0  (1.702 m)   Wt 282 lb (127.9 kg)   BMI 44.17 kg/m    Subjective:    Patient ID: Kenneth Fuller, male    DOB: Aug 11, 1945, 72 y.o.   MRN: [id number]  HPI: Kenneth Fuller is a 72 y.o. male presenting on 05/10/2017 for Hypertension, chest discomfort (nurse from insurance company called yesterday to discuss his meds and have him check BP which was elevated at that time, it was 199/105 this morning, has had some chest discomfort, dizziness)   HPI Elevated blood pressure and chest discomfort and shortness of breath on exertion Patient comes in today with a 12-hour history of elevated blood pressure and chest discomfort and exertional shortness of breath.  Patient says is been going on intermittently since yesterday and the chest discomfort is located on the left lower side of his chest.  He says when he is up moving around he feels the chest discomfort more and feels short of breath.  He does say he has been a lot more stressed over the past couple days because his brother is in the hospital and a lot of his family is calling him for updates and so has had to answer a lot of those updates and have that stress frequently.  Patient did just have a recent catheterization in October 2018 which showed less than 10% blockages in LAD and RCA and an ejection fraction of 55-65%.  His blood pressure when he woke this morning was 199/105 and then here in the office is 149/80.  He denies any shortness of breath at baseline and has been using his albuterol inhaler to see if it can help with the shortness of breath but it does not seem to be making a difference with the shortness of breath.  He denies any cough or fevers or chills or congestion.  Relevant past medical, surgical, family and social history reviewed and updated as indicated. Interim medical history since our last visit  reviewed. Allergies and medications reviewed and updated.  Review of Systems  Constitutional: Negative for chills and fever.  Respiratory: Positive for chest tightness and shortness of breath (on exertion). Negative for wheezing.   Cardiovascular: Positive for chest pain. Negative for leg swelling.  Musculoskeletal: Negative for back pain and gait problem.  Skin: Negative for rash.  Neurological: Negative for dizziness.  All other systems reviewed and are negative.   Per HPI unless specifically indicated above   Allergies as of 05/10/2017      Reactions   Amlodipine Besy-benazepril Hcl Swelling, Other (See Comments)   Makes tongue swell (lotrel)   Phenergan [promethazine Hcl] Other (See Comments)   "I can't remember."   Hydrocodone Itching   Can tolerate in low doses      Medication List        Accurate as of 05/10/17  9:23 AM. Always use your most recent med list.          albuterol 108 (90 Base) MCG/ACT inhaler Commonly known as:  PROVENTIL HFA;VENTOLIN HFA Inhale 2 puffs every 6 (six) hours as needed into the lungs for wheezing or shortness of breath.   atorvastatin 40 MG tablet Commonly known as:  LIPITOR TAKE 1 TABLET BY MOUTH ONCE DAILY   bisoprolol 10 MG tablet Commonly known as:  ZEBETA Take 1 tablet (10 mg total) daily  by mouth.   budesonide-formoterol 80-4.5 MCG/ACT inhaler Commonly known as:  SYMBICORT Inhale 2 puffs into the lungs 2 (two) times daily.   chlorpheniramine 2 MG/5ML syrup Commonly known as:  CHLOR-TRIMETON Take 2 mg by mouth every 4 (four) hours as needed for allergies.   chlorthalidone 25 MG tablet Commonly known as:  HYGROTON Take 1 tablet (25 mg total) by mouth daily.   DELSYM PO Take by mouth as needed.   furosemide 40 MG tablet Commonly known as:  LASIX Take 2 tablets (80 mg total) every morning by mouth AND 1 tablet (40 mg total) every evening.   gabapentin 100 MG capsule Commonly known as:  NEURONTIN Take 1 capsule (100 mg  total) by mouth 3 (three) times daily. One three times daily   glipiZIDE 5 MG 24 hr tablet Commonly known as:  GLUCOTROL XL Take 1 tablet (5 mg total) by mouth daily with breakfast.   glucose blood test strip Test 1X per day and prn One Touch Ultra test strips   LUMIFY OP Apply to eye.   metFORMIN 1000 MG tablet Commonly known as:  GLUCOPHAGE Take 1 tablet (1,000 mg total) by mouth 2 (two) times daily with a meal.   omeprazole 40 MG capsule Commonly known as:  PRILOSEC TAKE 1 CAPSULE BY MOUTH ONCE DAILY   ONETOUCH DELICA LANCETS 58N Misc 1 each by Does not apply route daily. Test 1X per day and prn   OXYGEN 2lpm with sleep   potassium chloride 10 MEQ tablet Commonly known as:  KLOR-CON M10 Take 1 tablet (10 mEq total) 2 (two) times daily by mouth.   PRODIGY BLOOD GLUCOSE MONITOR w/Device Kit 1 Units by Does not apply route daily. Check blood sugars 1X per day-- dx 250.02   SYSTANE OP Apply to eye.   telmisartan 40 MG tablet Commonly known as:  MICARDIS Take 1 tablet (40 mg total) by mouth daily.   traMADol 50 MG tablet Commonly known as:  ULTRAM 1-2 every 4 hours as needed for cough or pain   trimethoprim-polymyxin b ophthalmic solution Commonly known as:  POLYTRIM Place 1 drop into the right eye every 4 (four) hours. Use for 7 days   VITAMIN D PO Take 1 tablet by mouth every morning.          Objective:    BP (!) 149/80 (BP Location: Left Arm, Cuff Size: Large)   Pulse 66   Temp (!) 97.4 F (36.3 C) (Oral)   Ht _0  (1.702 m)   Wt 282 lb (127.9 kg)   BMI 44.17 kg/m   Wt Readings from Last 3 Encounters:  05/10/17 282 lb (127.9 kg)  04/12/17 275 lb (124.7 kg)  04/08/17 273 lb 12.8 oz (124.2 kg)    Physical Exam  Constitutional: He is oriented to person, place, and time. He appears well-developed and well-nourished. No distress.  Eyes: Conjunctivae are normal. No scleral icterus.  Neck: Neck supple. No thyromegaly present.  Cardiovascular:  Normal rate, regular rhythm, normal heart sounds and intact distal pulses.  No murmur heard. Pulmonary/Chest: Effort normal and breath sounds normal. No respiratory distress. He has no wheezes. He has no rales. He exhibits tenderness (Reproducible lower chest wall tenderness).  Lymphadenopathy:    He has no cervical adenopathy.  Neurological: He is alert and oriented to person, place, and time. Coordination normal.  Skin: Skin is warm and dry. No rash noted. He is not diaphoretic.  Psychiatric: He has a normal mood and affect. His  behavior is normal.  Nursing note and vitals reviewed.   EKG shows T wave inversion on V1 V2 but is unchanged from previous.    Assessment & Plan:   Problem List Items Addressed This Visit      Cardiovascular and Mediastinum   Hypertension associated with diabetes ([place])   Relevant Orders   BMP8+EGFR    Other Visit Diagnoses    Chest discomfort    -  Primary   Relevant Orders   EKG 12-Lead (Completed)   BMP8+EGFR   Troponin I      Spoke with Dr. Ellyn Hack who is a partner of Dr. Recardo Evangelist and he has an appointment to go see them on Thursday at 1130 after [address] with 1 of his physician assistants.  He agrees with recent normal catheterization it is unlikely this is cardiac in nature.  Spoke with cardiology and he said to do one stat troponin.  Follow up plan: Return if symptoms worsen or fail to improve.  Counseling provided for all of the vaccine components Orders Placed This Encounter  Procedures  . BMP8+EGFR  . EKG 12-Lead    Caryl Pina, MD [facility] 05/10/2017, 9:23 AM

## 2017-05-10 NOTE — Patient Instructions (Addendum)
Thursday at 11:30 AM appointment with cardiology PA at their [place]. Office.

## 2017-05-10 NOTE — Telephone Encounter (Signed)
Pt with recent Cath & non-obstructive CAD seen by PCP with off & on persistent CP - per PCP somewhat Atypical in nature.  Pt. Non-toxic.  Plan is to check Troponin level & schedule APP f/u.  Glenetta Hew, MD

## 2017-05-11 DIAGNOSIS — H17821 Peripheral opacity of cornea, right eye: Secondary | ICD-10-CM | POA: Diagnosis not present

## 2017-05-11 DIAGNOSIS — H2513 Age-related nuclear cataract, bilateral: Secondary | ICD-10-CM | POA: Diagnosis not present

## 2017-05-11 DIAGNOSIS — H04123 Dry eye syndrome of bilateral lacrimal glands: Secondary | ICD-10-CM | POA: Diagnosis not present

## 2017-05-12 ENCOUNTER — Encounter: Payer: Self-pay | Admitting: Physician Assistant

## 2017-05-12 ENCOUNTER — Ambulatory Visit: Payer: PPO | Admitting: Physician Assistant

## 2017-05-12 VITALS — BP 132/84 | HR 78 | Ht 67.0 in | Wt 282.0 lb

## 2017-05-12 DIAGNOSIS — J984 Other disorders of lung: Secondary | ICD-10-CM | POA: Diagnosis not present

## 2017-05-12 DIAGNOSIS — R0789 Other chest pain: Secondary | ICD-10-CM | POA: Diagnosis not present

## 2017-05-12 DIAGNOSIS — E119 Type 2 diabetes mellitus without complications: Secondary | ICD-10-CM

## 2017-05-12 DIAGNOSIS — E785 Hyperlipidemia, unspecified: Secondary | ICD-10-CM | POA: Diagnosis not present

## 2017-05-12 DIAGNOSIS — I25119 Atherosclerotic heart disease of native coronary artery with unspecified angina pectoris: Secondary | ICD-10-CM

## 2017-05-12 NOTE — Patient Instructions (Signed)
Almyra Deforest, PA-c recommends that you schedule a follow-up appointment in 12 months with Dr Sallyanne Kuster. You will receive a reminder letter in the mail two months in advance. If you don't receive a letter, please call our office to schedule the follow-up appointment.  If you need a refill on your cardiac medications before your next appointment, please call your pharmacy.

## 2017-05-12 NOTE — Progress Notes (Signed)
Cardiology Office Note    Date:  05/13/2017   ID:  Kenneth Fuller, DOB 22-Jun-1945, MRN [id number]  PCP:  [name], Kenneth Kaufmann, MD  Cardiologist:  Dr. Sallyanne Fuller  Chief Complaint  Patient presents with  . Follow-up    seen for Dr. Sallyanne Fuller.     History of Present Illness:  Kenneth Fuller is a 72 y.o. male with PMH of morbid obesity, OSA, HLD, DM II with neuropathy, mild CAD, and moderately severe restrictive lung disease.  Last echocardiogram obtained on 12/01/2016 showed EF 60-65%, grade 2 DD.  Last cardiac catheterization on 12/02/2016 showed 10% disease in mid LAD in the mid RCA, otherwise normal coronaries.  Pulmonary function test previously showed fairly severe obstructive lung disease with FEV1 of 40-45%.  He did not have much improvement with bronchodilator on PFT.  He quit smoking 40 years ago.  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   Patient presents today for cardiology office visit.  He recently had some intermittent chest pain.  He also mentioned his older brother is currently on life support in the ICU in [place].  Prior to that, his brother was admitted for 2 weeks in [facility]. His recent chest pain would last several minutes each time, however he denies any exertional component.  He feels his chest pain is worse when he is anxious.  Given lack of exertional symptoms and essentially normal coronary artery recent cardiac catheterization no further workup is needed.  He says after today's visit, he is going to drive up to [facility] and potentially make final decision for his brother's care.  He also states, he has not been using CPAP, instead he uses 2 L oxygen at night.  He has trouble tolerating the CPAP machine.   Past Medical History:  Diagnosis Date  . Allergy   . Asthma   . CAD (coronary artery disease)   . Diabetes mellitus   . Fibromyalgia   . GERD (gastroesophageal reflux disease)   . GI  bleeding   . Gout   . Hyperlipidemia   . Hypertension   . Hypogonadism male   . MRSA cellulitis   . Neuropathy   . Obesity   . Shortness of breath dyspnea    with exertion   . Sleep apnea    cpap- 14   . Wheezing    no asthma diagnosis    Past Surgical History:  Procedure Laterality Date  . BACK SURGERY    . CARDIAC CATHETERIZATION    . COLONOSCOPY    . LEFT HEART CATH AND CORONARY ANGIOGRAPHY N/A 12/02/2016   Procedure: LEFT HEART CATH AND CORONARY ANGIOGRAPHY;  Surgeon: Jettie Booze, MD;  Location: [facility];  Service: Cardiovascular;  Laterality: N/A;  . LUMBAR LAMINECTOMY/DECOMPRESSION MICRODISCECTOMY N/A 01/02/2014   Procedure: CENTRAL DECOMPRESSION LUMBAR LAMINECTOMY L3-L4, L4-L5;  Surgeon: Tobi Bastos, MD;  Location: [facility];  Service: Orthopedics;  Laterality: N/A;  . neck fusion      Current Medications: Outpatient Medications Prior to Visit  Medication Sig Dispense Refill  . albuterol (PROVENTIL HFA;VENTOLIN HFA) 108 (90 Base) MCG/ACT inhaler Inhale 2 puffs every 6 (six) hours as needed into the lungs for wheezing or shortness of breath. 1 Inhaler 3  . atorvastatin (LIPITOR) 40 MG tablet TAKE 1 TABLET BY MOUTH ONCE DAILY 90 tablet 0  . bisoprolol (ZEBETA) 10 MG tablet Take 1 tablet (10 mg total) daily by  mouth. 90 tablet 3  . Blood Glucose Monitoring Suppl (PRODIGY BLOOD GLUCOSE MONITOR) W/DEVICE KIT 1 Units by Does not apply route daily. Check blood sugars 1X per day-- dx 250.02 1 each 0  . Brimonidine Tartrate (LUMIFY OP) Apply to eye.    . budesonide-formoterol (SYMBICORT) 80-4.5 MCG/ACT inhaler Inhale 2 puffs into the lungs 2 (two) times daily. 1 Inhaler 5  . chlorpheniramine (CHLOR-TRIMETON) 2 MG/5ML syrup Take 2 mg by mouth every 4 (four) hours as needed for allergies.    . chlorthalidone (HYGROTON) 25 MG tablet Take 1 tablet (25 mg total) by mouth daily. 30 tablet 3  . Cholecalciferol (VITAMIN D PO) Take 1 tablet by mouth every morning.      . clonazePAM (KLONOPIN) 0.5 MG tablet Take 1 tablet (0.5 mg total) by mouth 2 (two) times daily as needed for anxiety. 20 tablet 1  . Dextromethorphan Polistirex (DELSYM PO) Take by mouth as needed.     . furosemide (LASIX) 40 MG tablet Take 2 tablets (80 mg total) every morning by mouth AND 1 tablet (40 mg total) every evening. 270 tablet 3  . gabapentin (NEURONTIN) 100 MG capsule Take 1 capsule (100 mg total) by mouth 3 (three) times daily. One three times daily 90 capsule 2  . glipiZIDE (GLUCOTROL XL) 5 MG 24 hr tablet Take 1 tablet (5 mg total) by mouth daily with breakfast. 90 tablet 1  . glucose blood test strip Test 1X per day and prn One Touch Ultra test strips 100 each 2  . metFORMIN (GLUCOPHAGE) 1000 MG tablet Take 1 tablet (1,000 mg total) by mouth 2 (two) times daily with a meal. 180 tablet 1  . omeprazole (PRILOSEC) 40 MG capsule TAKE 1 CAPSULE BY MOUTH ONCE DAILY 90 capsule 1  . ONETOUCH DELICA LANCETS 16S MISC 1 each by Does not apply route daily. Test 1X per day and prn 100 each 2  . OXYGEN 2lpm with sleep    . Polyethyl Glycol-Propyl Glycol (SYSTANE OP) Apply to eye.    . potassium chloride (KLOR-CON M10) 10 MEQ tablet Take 1 tablet (10 mEq total) 2 (two) times daily by mouth. 180 tablet 3  . telmisartan (MICARDIS) 40 MG tablet Take 1 tablet (40 mg total) by mouth daily. 90 tablet 2  . traMADol (ULTRAM) 50 MG tablet 1-2 every 4 hours as needed for cough or pain 30 tablet 0  . trimethoprim-polymyxin b (POLYTRIM) ophthalmic solution Place 1 drop into the right eye every 4 (four) hours. Use for 7 days 10 mL 0   No facility-administered medications prior to visit.      Allergies:   Amlodipine besy-benazepril hcl; Phenergan [promethazine hcl]; and Hydrocodone   Social History   Socioeconomic History  . Marital status: Married    Spouse name: Not on file  . Number of children: 3  . Years of education: Not on file  . Highest education level: Not on file  Occupational History   . Occupation: retired/disability 1999    Employer: DISABLED    Comment: Textiles  Social Needs  . Financial resource strain: Not on file  . Food insecurity:    Worry: Not on file    Inability: Not on file  . Transportation needs:    Medical: Not on file    Non-medical: Not on file  Tobacco Use  . Smoking status: Former Smoker    Last attempt to quit: 02/08/1969    Years since quitting: 48.2  . Smokeless tobacco: Former Systems developer  Substance and Sexual Activity  . Alcohol use: No    Alcohol/week: 0.0 oz  . Drug use: No  . Sexual activity: Not on file  Lifestyle  . Physical activity:    Days per week: Not on file    Minutes per session: Not on file  . Stress: Not on file  Relationships  . Social connections:    Talks on phone: Not on file    Gets together: Not on file    Attends religious service: Not on file    Active member of club or organization: Not on file    Attends meetings of clubs or organizations: Not on file    Relationship status: Not on file  Other Topics Concern  . Not on file  Social History Narrative   Drinks caffeine tea occasionally      Family History:  The patient's family history includes Colon cancer in his mother; Deep vein thrombosis in his brother; Diabetes in his father; Drug abuse in his sister; Heart attack in his father; Heart attack (age of onset: 61) in his son; Heart disease in his brother, brother, and father; Heart failure in his sister; Kidney disease in his sister.   ROS:   Please see the history of present illness.    ROS All other systems reviewed and are negative.   PHYSICAL EXAM:   VS:  BP 132/84   Pulse 78   Ht _0  (1.702 m)   Wt 282 lb (127.9 kg)   SpO2 94%   BMI 44.17 kg/m    GEN: Well nourished, well developed, in no acute distress  HEENT: normal  Neck: no JVD, carotid bruits, or masses Cardiac: RRR; no murmurs, rubs, or gallops,no edema  Respiratory:  clear to auscultation bilaterally, normal work of breathing GI:  soft, nontender, nondistended, + BS MS: no deformity or atrophy  Skin: warm and dry, no rash Neuro:  Alert and Oriented x 3, Strength and sensation are intact Psych: euthymic mood, full affect  Wt Readings from Last 3 Encounters:  05/12/17 282 lb (127.9 kg)  05/10/17 282 lb (127.9 kg)  04/12/17 275 lb (124.7 kg)      Studies/Labs Reviewed:   EKG:  EKG is not ordered today.    Recent Labs: 11/30/2016: Hemoglobin 13.7; Platelets 285 02/25/2017: Pro B Natriuretic peptide (BNP) 97.0; TSH 3.30 04/18/2017: ALT 30 05/10/2017: BUN 23; Creatinine, Ser 1.25; Potassium 4.8; Sodium 141   Lipid Panel    Component Value Date/Time   CHOL 89 (L) 11/29/2016 0800   CHOL 111 06/12/2012 1232   TRIG 113 11/29/2016 0800   TRIG 141 01/24/2014 1143   TRIG 192 (H) 06/12/2012 1232   HDL 36 (L) 11/29/2016 0800   HDL 38 (L) 01/24/2014 1143   HDL 33 (L) 06/12/2012 1232   CHOLHDL 2.5 11/29/2016 0800   CHOLHDL 4.8 11/22/2006 1712   VLDL 39 11/22/2006 1712   LDLCALC 30 11/29/2016 0800   LDLCALC 27 10/25/2013 1224   LDLCALC 40 06/12/2012 1232    Additional studies/ records that were reviewed today include:   Echo 12/01/2016 LV EF: 60% -   65% Study Conclusions  - Left ventricle: The cavity size was normal. Wall thickness was   normal. Systolic function was normal. The estimated ejection   fraction was in the range of 60% to 65%. Wall motion was normal;   there were no regional wall motion abnormalities. Features are   consistent with a pseudonormal left ventricular filling pattern,   with  concomitant abnormal relaxation and increased filling   pressure (grade 2 diastolic dysfunction).  Impressions:  - The echo is technically very difficult and the images are of poor   quality.   Cath 12/02/2016 Conclusion     Mid LAD lesion, 10 %stenosed.  Mid RCA lesion, 10 %stenosed.  The left ventricular systolic function is normal.  LV end diastolic pressure is mildly elevated.  The left  ventricular ejection fraction is 55-65% by visual estimate.  There is no aortic valve stenosis.   Mild, nonobstructive coronary atherosclerosis.  Continue aggressive preventive therapy.    Mild volume overload.  Continue diuretics.      ASSESSMENT:    1. Atypical chest pain   2. Hyperlipidemia, unspecified hyperlipidemia type   3. Controlled type 2 diabetes mellitus without complication, without long-term current use of insulin ([place])   4. Atherosclerosis of native coronary artery of native heart with angina pectoris ([place])   5. Restrictive lung disease      PLAN:  In order of problems listed above:  1. Atypical chest pain: Troponin obtained on 05/10/2017 was negative.  His chest pain seems to be more related to emotion.  His older brother is currently on life support on [place].  He is under a lot of stress.  Recent cardiac catheterization in late 2018 only showed 10% disease, his symptoms does not occur with exertion, no further ischemic workup is needed  2. Hyperlipidemia: On Lipitor 40 mg daily.  Last lipid panel obtained in October 2018 showed total cholesterol 89, triglyceride 113, HDL 36, LDL 30.  3. DM 2: Managed by primary care provider.  4. Chronic restrictive lung disease: No acute disease recently.  His breathing is stable    Medication Adjustments/Labs and Tests Ordered: Current medicines are reviewed at length with the patient today.  Concerns regarding medicines are outlined above.  Medication changes, Labs and Tests ordered today are listed in the Patient Instructions below. Patient Instructions  Almyra Deforest, PA-c recommends that you schedule a follow-up appointment in 12 months with Dr Kenneth Fuller. You will receive a reminder letter in the mail two months in advance. If you don't receive a letter, please call our office to schedule the follow-up appointment.  If you need a refill on your cardiac medications before your next appointment, please call your  pharmacy.    Hilbert Corrigan, [place]  05/13/2017 4:00 PM    [facility] [place], [place], [place]  [address] Phone: [phone]; Fax: [phone]

## 2017-05-13 ENCOUNTER — Encounter: Payer: Self-pay | Admitting: Physician Assistant

## 2017-05-14 ENCOUNTER — Other Ambulatory Visit: Payer: Self-pay | Admitting: Family Medicine

## 2017-05-15 NOTE — Progress Notes (Signed)
Thanks, [employer]

## 2017-05-17 DIAGNOSIS — R0600 Dyspnea, unspecified: Secondary | ICD-10-CM | POA: Diagnosis not present

## 2017-05-17 DIAGNOSIS — R0902 Hypoxemia: Secondary | ICD-10-CM | POA: Diagnosis not present

## 2017-05-31 ENCOUNTER — Ambulatory Visit (INDEPENDENT_AMBULATORY_CARE_PROVIDER_SITE_OTHER): Payer: PPO | Admitting: Family Medicine

## 2017-05-31 ENCOUNTER — Encounter: Payer: Self-pay | Admitting: Family Medicine

## 2017-05-31 VITALS — BP 151/79 | HR 81 | Temp 97.4°F | Ht 67.0 in | Wt 282.0 lb

## 2017-05-31 DIAGNOSIS — J9611 Chronic respiratory failure with hypoxia: Secondary | ICD-10-CM | POA: Diagnosis not present

## 2017-05-31 DIAGNOSIS — J9612 Chronic respiratory failure with hypercapnia: Secondary | ICD-10-CM

## 2017-05-31 DIAGNOSIS — G4733 Obstructive sleep apnea (adult) (pediatric): Secondary | ICD-10-CM | POA: Diagnosis not present

## 2017-05-31 DIAGNOSIS — I10 Essential (primary) hypertension: Secondary | ICD-10-CM | POA: Diagnosis not present

## 2017-05-31 DIAGNOSIS — E1159 Type 2 diabetes mellitus with other circulatory complications: Secondary | ICD-10-CM

## 2017-05-31 DIAGNOSIS — Z9989 Dependence on other enabling machines and devices: Secondary | ICD-10-CM

## 2017-05-31 DIAGNOSIS — I152 Hypertension secondary to endocrine disorders: Secondary | ICD-10-CM

## 2017-05-31 MED ORDER — ALBUTEROL SULFATE 108 (90 BASE) MCG/ACT IN AEPB: 2.0000 | INHALATION_SPRAY | Freq: Four times a day (QID) | RESPIRATORY_TRACT | 0 refills | Status: DC | PRN

## 2017-05-31 NOTE — Progress Notes (Signed)
Subjective: CC: HTN/ SOB PCP: [name], [name], MD BBC:[id number] Kenneth Fuller is a 72 y.o. male presenting to clinic today for:  1. Hypertension Patient reports Blood pressure at home: 200/100s; he has been using a wrist blood pressure cuff, which he brings to the office today.  Meds: Compliant with Micardis, bisoprolol, chlorthalidone.  He has discontinued Lasix, as he notes that he was told that he will need to see a nephrologist, Side effects: None.  He reports that he has intermittent atypical chest pain but he has been evaluated by cardiology regarding this.  Denies headache, nausea, vomiting, LE swelling, abdominal pain.  He does have shortness of breath as below  2. SOB This is a chronic issue for the patient but he feels like it perhaps getting worse.  He sees pulmonology and was evaluated in March.  At that time, he was not using his CPAP secondary to discomfort.  He notes that he wears oxygen at nighttime but has not been utilizing the CPAP.  He states that last week, he was "thinking about going back to it" but that he did not have the proper hose to connected and therefore has not utilized it.  He has a follow-up appointment with pulmonology on 06/08/2017.  He notes compliance with the Symbicort but has not been using the albuterol, as he did not know that he could use this in conjunction with the Symbicort.  He sometimes uses the Symbicort more than twice a day to help with shortness of breath.  He reports that dyspnea is typically on exertion.  He experiences dyspnea with activities such as showering.  Denies any lower extremity edema.  ROS: Per HPI  Allergies  Allergen Reactions  . Amlodipine Besy-Benazepril Hcl Swelling and Other (See Comments)    Makes tongue swell (lotrel)  . Phenergan [Promethazine Hcl] Other (See Comments)    "I can't remember."  . Hydrocodone Itching    Can tolerate in low doses   Past Medical History:  Diagnosis Date  . Allergy   . Asthma   . CAD  (coronary artery disease)   . Diabetes mellitus   . Fibromyalgia   . GERD (gastroesophageal reflux disease)   . GI bleeding   . Gout   . Hyperlipidemia   . Hypertension   . Hypogonadism male   . MRSA cellulitis   . Neuropathy   . Obesity   . Shortness of breath dyspnea    with exertion   . Sleep apnea    cpap- 14   . Wheezing    no asthma diagnosis    Current Outpatient Medications:  .  albuterol (PROVENTIL HFA;VENTOLIN HFA) 108 (90 Base) MCG/ACT inhaler, Inhale 2 puffs every 6 (six) hours as needed into the lungs for wheezing or shortness of breath., Disp: 1 Inhaler, Rfl: 3 .  atorvastatin (LIPITOR) 40 MG tablet, TAKE 1 TABLET BY MOUTH ONCE DAILY, Disp: 90 tablet, Rfl: 0 .  bisoprolol (ZEBETA) 10 MG tablet, Take 1 tablet (10 mg total) daily by mouth., Disp: 90 tablet, Rfl: 3 .  Blood Glucose Monitoring Suppl (PRODIGY BLOOD GLUCOSE MONITOR) W/DEVICE KIT, 1 Units by Does not apply route daily. Check blood sugars 1X per day-- dx 250.02, Disp: 1 each, Rfl: 0 .  Brimonidine Tartrate (LUMIFY OP), Apply to eye., Disp: , Rfl:  .  budesonide-formoterol (SYMBICORT) 80-4.5 MCG/ACT inhaler, Inhale 2 puffs into the lungs 2 (two) times daily., Disp: 1 Inhaler, Rfl: 5 .  chlorpheniramine (CHLOR-TRIMETON) 2 MG/5ML syrup, Take 2  mg by mouth every 4 (four) hours as needed for allergies., Disp: , Rfl:  .  chlorthalidone (HYGROTON) 25 MG tablet, Take 1 tablet (25 mg total) by mouth daily., Disp: 30 tablet, Rfl: 3 .  Cholecalciferol (VITAMIN Kenneth PO), Take 1 tablet by mouth every morning., Disp: , Rfl:  .  clonazePAM (KLONOPIN) 0.5 MG tablet, Take 1 tablet (0.5 mg total) by mouth 2 (two) times daily as needed for anxiety., Disp: 20 tablet, Rfl: 1 .  Dextromethorphan Polistirex (DELSYM PO), Take by mouth as needed. , Disp: , Rfl:  .  furosemide (LASIX) 40 MG tablet, Take 2 tablets (80 mg total) every morning by mouth AND 1 tablet (40 mg total) every evening., Disp: 270 tablet, Rfl: 3 .  gabapentin  (NEURONTIN) 100 MG capsule, Take 1 capsule (100 mg total) by mouth 3 (three) times daily. One three times daily, Disp: 90 capsule, Rfl: 2 .  glipiZIDE (GLUCOTROL XL) 5 MG 24 hr tablet, Take 1 tablet (5 mg total) by mouth daily with breakfast., Disp: 90 tablet, Rfl: 1 .  glucose blood test strip, Test 1X per day and prn One Touch Ultra test strips, Disp: 100 each, Rfl: 2 .  metFORMIN (GLUCOPHAGE) 1000 MG tablet, Take 1 tablet (1,000 mg total) by mouth 2 (two) times daily with a meal., Disp: 180 tablet, Rfl: 1 .  omeprazole (PRILOSEC) 40 MG capsule, TAKE 1 CAPSULE BY MOUTH ONCE DAILY, Disp: 90 capsule, Rfl: 1 .  ONETOUCH DELICA LANCETS 66A MISC, 1 each by Does not apply route daily. Test 1X per day and prn, Disp: 100 each, Rfl: 2 .  OXYGEN, 2lpm with sleep, Disp: , Rfl:  .  Polyethyl Glycol-Propyl Glycol (SYSTANE OP), Apply to eye., Disp: , Rfl:  .  potassium chloride (KLOR-CON M10) 10 MEQ tablet, Take 1 tablet (10 mEq total) 2 (two) times daily by mouth., Disp: 180 tablet, Rfl: 3 .  telmisartan (MICARDIS) 40 MG tablet, Take 1 tablet (40 mg total) by mouth daily., Disp: 90 tablet, Rfl: 2 .  traMADol (ULTRAM) 50 MG tablet, 1-2 every 4 hours as needed for cough or pain, Disp: 30 tablet, Rfl: 0 .  trimethoprim-polymyxin b (POLYTRIM) ophthalmic solution, Place 1 drop into the right eye every 4 (four) hours. Use for 7 days, Disp: 10 mL, Rfl: 0 Social History   Socioeconomic History  . Marital status: Married    Spouse name: Not on file  . Number of children: 3  . Years of education: Not on file  . Highest education level: Not on file  Occupational History  . Occupation: retired/disability 1999    Employer: DISABLED    Comment: Textiles  Social Needs  . Financial resource strain: Not on file  . Food insecurity:    Worry: Not on file    Inability: Not on file  . Transportation needs:    Medical: Not on file    Non-medical: Not on file  Tobacco Use  . Smoking status: Former Smoker    Last  attempt to quit: 02/08/1969    Years since quitting: 48.3  . Smokeless tobacco: Former Network engineer and Sexual Activity  . Alcohol use: No    Alcohol/week: 0.0 oz  . Drug use: No  . Sexual activity: Not on file  Lifestyle  . Physical activity:    Days per week: Not on file    Minutes per session: Not on file  . Stress: Not on file  Relationships  . Social connections:  Talks on phone: Not on file    Gets together: Not on file    Attends religious service: Not on file    Active member of club or organization: Not on file    Attends meetings of clubs or organizations: Not on file    Relationship status: Not on file  . Intimate partner violence:    Fear of current or ex partner: Not on file    Emotionally abused: Not on file    Physically abused: Not on file    Forced sexual activity: Not on file  Other Topics Concern  . Not on file  Social History Narrative   Drinks caffeine tea occasionally    Family History  Problem Relation Age of Onset  . Colon cancer Mother   . Diabetes Father        siblings  . Heart disease Father        brother  . Heart attack Father   . Kidney disease Sister   . Heart failure Sister   . Heart disease Brother   . Heart attack Son 4  . Drug abuse Sister   . Heart disease Brother   . Deep vein thrombosis Brother   . Colon polyps Neg Hx     Objective: Office vital signs reviewed. BP (!) 151/79   Pulse 81   Temp (!) 97.4 F (36.3 C) (Oral)   Ht _0  (1.702 m)   Wt 282 lb (127.9 kg)   SpO2 94%   BMI 44.17 kg/m   Physical Examination:  General: Awake, alert, morbidly obese, No acute distress HEENT: Normal    Neck: No masses palpated. No lymphadenopathy; no JVD Cardio: regular rate and rhythm, S1S2 heard, no murmurs appreciated Pulm: clear to auscultation bilaterally, no wheezes, rhonchi or rales; normal work of breathing on room air Extremities: warm, well perfused, No edema, cyanosis or clubbing; +2 pulses  bilaterally  Assessment/ Plan: 72 y.o. male   1. Chronic respiratory failure with hypoxia and hypercapnia (HCC) likely  [facility]  Not compliant with CPAP.  We discussed the importance of CPAP use.  No evidence of fluid overload on today's exam. I have renewed his albuterol inhaler.  Instructions for use discussed.  We discussed proper use of Symbicort.  He has an appointment next week with pulmonology.  I will defer to them for further medication management.  2. OSA on CPAP See above.  3. Hypertension associated with diabetes ([place]) Not at goal.  Blood pressure was at goal during his last several visits.  Therefore, no medication changes were made today.  I did advise him to reduce salt.  I wonder if the holiday meals have impacted his blood pressure today.  He has a follow-up with his PCP in the next 2 weeks.  I recommended that he keep this.  Given the significant discrepancy in home blood pressures and office blood pressures, I also recommended that he replace his wrist blood pressure cuff with a true arm cuff for blood pressure monitoring.  He voiced good understanding.  Meds ordered this encounter  Medications  . Albuterol Sulfate (PROAIR RESPICLICK) 878 (90 Base) MCG/ACT AEPB    Sig: Inhale 2 puffs into the lungs every 6 (six) hours as needed (for shortness of breath/ wheeze).    Dispense:  1 each    Refill:  [place] [place] [phone]

## 2017-05-31 NOTE — Patient Instructions (Signed)
I have ordered albuterol inhaler for you to use for shortness of breath on exertion.  Follow up with Dr Warrick Parisian as scheduled.  I will wait to change blood pressure medications until that time.   DASH Eating Plan DASH stands for "Dietary Approaches to Stop Hypertension." The DASH eating plan is a healthy eating plan that has been shown to reduce high blood pressure (hypertension). It may also reduce your risk for type 2 diabetes, heart disease, and stroke. The DASH eating plan may also help with weight loss. What are tips for following this plan? General guidelines  Avoid eating more than 2,300 mg (milligrams) of salt (sodium) a day. If you have hypertension, you may need to reduce your sodium intake to 1,500 mg a day.  Limit alcohol intake to no more than 1 drink a day for nonpregnant women and 2 drinks a day for men. One drink equals 12 oz of beer, 5 oz of wine, or 1 oz of hard liquor.  Work with your health care provider to maintain a healthy body weight or to lose weight. Ask what an ideal weight is for you.  Get at least 30 minutes of exercise that causes your heart to beat faster (aerobic exercise) most days of the week. Activities may include walking, swimming, or biking.  Work with your health care provider or diet and nutrition specialist (dietitian) to adjust your eating plan to your individual calorie needs. Reading food labels  Check food labels for the amount of sodium per serving. Choose foods with less than 5 percent of the Daily Value of sodium. Generally, foods with less than 300 mg of sodium per serving fit into this eating plan.  To find whole grains, look for the word "whole" as the first word in the ingredient list. Shopping  Buy products labeled as "low-sodium" or "no salt added."  Buy fresh foods. Avoid canned foods and premade or frozen meals. Cooking  Avoid adding salt when cooking. Use salt-free seasonings or herbs instead of table salt or sea salt. Check  with your health care provider or pharmacist before using salt substitutes.  Do not fry foods. Cook foods using healthy methods such as baking, boiling, grilling, and broiling instead.  Cook with heart-healthy oils, such as olive, canola, soybean, or sunflower oil. Meal planning   Eat a balanced diet that includes: ? 5 or more servings of fruits and vegetables each day. At each meal, try to fill half of your plate with fruits and vegetables. ? Up to 6-8 servings of whole grains each day. ? Less than 6 oz of lean meat, poultry, or fish each day. A 3-oz serving of meat is about the same size as a deck of cards. One egg equals 1 oz. ? 2 servings of low-fat dairy each day. ? A serving of nuts, seeds, or beans 5 times each week. ? Heart-healthy fats. Healthy fats called Omega-3 fatty acids are found in foods such as flaxseeds and coldwater fish, like sardines, salmon, and mackerel.  Limit how much you eat of the following: ? Canned or prepackaged foods. ? Food that is high in trans fat, such as fried foods. ? Food that is high in saturated fat, such as fatty meat. ? Sweets, desserts, sugary drinks, and other foods with added sugar. ? Full-fat dairy products.  Do not salt foods before eating.  Try to eat at least 2 vegetarian meals each week.  Eat more home-cooked food and less restaurant, buffet, and fast food.  When  eating at a restaurant, ask that your food be prepared with less salt or no salt, if possible. What foods are recommended? The items listed may not be a complete list. Talk with your dietitian about what dietary choices are best for you. Grains Whole-grain or whole-wheat bread. Whole-grain or whole-wheat pasta. Brown rice. [place]. Bulgur. Whole-grain and low-sodium cereals. Pita bread. Low-fat, low-sodium crackers. Whole-wheat flour tortillas. Vegetables Fresh or frozen vegetables (raw, steamed, roasted, or grilled). Low-sodium or reduced-sodium tomato and  vegetable juice. Low-sodium or reduced-sodium tomato sauce and tomato paste. Low-sodium or reduced-sodium canned vegetables. Fruits All fresh, dried, or frozen fruit. Canned fruit in natural juice (without added sugar). Meat and other protein foods Skinless chicken or [place]. Ground chicken or [place]. Pork with fat trimmed off. Fish and seafood. Egg whites. Dried beans, peas, or lentils. Unsalted nuts, nut butters, and seeds. Unsalted canned beans. Lean cuts of beef with fat trimmed off. Low-sodium, lean deli meat. Dairy Low-fat (1%) or fat-free (skim) milk. Fat-free, low-fat, or reduced-fat cheeses. Nonfat, low-sodium ricotta or cottage cheese. Low-fat or nonfat yogurt. Low-fat, low-sodium cheese. Fats and oils Soft margarine without trans fats. Vegetable oil. Low-fat, reduced-fat, or light mayonnaise and salad dressings (reduced-sodium). Canola, safflower, olive, soybean, and sunflower oils. Avocado. Seasoning and other foods Herbs. Spices. Seasoning mixes without salt. Unsalted popcorn and pretzels. Fat-free sweets. What foods are not recommended? The items listed may not be a complete list. Talk with your dietitian about what dietary choices are best for you. Grains Baked goods made with fat, such as croissants, muffins, or some breads. Dry pasta or rice meal packs. Vegetables Creamed or fried vegetables. Vegetables in a cheese sauce. Regular canned vegetables (not low-sodium or reduced-sodium). Regular canned tomato sauce and paste (not low-sodium or reduced-sodium). Regular tomato and vegetable juice (not low-sodium or reduced-sodium). Angie Fava. Olives. Fruits Canned fruit in a light or heavy syrup. Fried fruit. Fruit in cream or butter sauce. Meat and other protein foods Fatty cuts of meat. Ribs. Fried meat. Berniece Salines. Sausage. Bologna and other processed lunch meats. Salami. Fatback. Hotdogs. Bratwurst. Salted nuts and seeds. Canned beans with added salt. Canned or smoked fish. Whole eggs or  egg yolks. Chicken or [place] with skin. Dairy Whole or 2% milk, cream, and half-and-half. Whole or full-fat cream cheese. Whole-fat or sweetened yogurt. Full-fat cheese. Nondairy creamers. Whipped toppings. Processed cheese and cheese spreads. Fats and oils Butter. Stick margarine. Lard. Shortening. Ghee. Bacon fat. Tropical oils, such as coconut, palm kernel, or palm oil. Seasoning and other foods Salted popcorn and pretzels. Onion salt, garlic salt, seasoned salt, table salt, and sea salt. Worcestershire sauce. Tartar sauce. Barbecue sauce. Teriyaki sauce. Soy sauce, including reduced-sodium. Steak sauce. Canned and packaged gravies. Fish sauce. Oyster sauce. Cocktail sauce. Horseradish that you find on the shelf. Ketchup. Mustard. Meat flavorings and tenderizers. Bouillon cubes. Hot sauce and Tabasco sauce. Premade or packaged marinades. Premade or packaged taco seasonings. Relishes. Regular salad dressings. Where to find more information:  National Heart, Lung, and [place]: https://wilson-eaton.com/  American Heart Association: www.heart.org Summary  The DASH eating plan is a healthy eating plan that has been shown to reduce high blood pressure (hypertension). It may also reduce your risk for type 2 diabetes, heart disease, and stroke.  With the DASH eating plan, you should limit salt (sodium) intake to 2,300 mg a day. If you have hypertension, you may need to reduce your sodium intake to 1,500 mg a day.  When on the DASH eating plan, aim  to eat more fresh fruits and vegetables, whole grains, lean proteins, low-fat dairy, and heart-healthy fats.  Work with your health care provider or diet and nutrition specialist (dietitian) to adjust your eating plan to your individual calorie needs. This information is not intended to replace advice given to you by your health care provider. Make sure you discuss any questions you have with your health care provider. Document Released: 01/14/2011  Document Revised: 01/19/2016 Document Reviewed: 01/19/2016 Elsevier Interactive Patient Education  [employer].

## 2017-06-03 DIAGNOSIS — N183 Chronic kidney disease, stage 3 (moderate): Secondary | ICD-10-CM | POA: Diagnosis not present

## 2017-06-03 DIAGNOSIS — G4733 Obstructive sleep apnea (adult) (pediatric): Secondary | ICD-10-CM | POA: Diagnosis not present

## 2017-06-03 DIAGNOSIS — E1129 Type 2 diabetes mellitus with other diabetic kidney complication: Secondary | ICD-10-CM | POA: Diagnosis not present

## 2017-06-03 DIAGNOSIS — I5020 Unspecified systolic (congestive) heart failure: Secondary | ICD-10-CM | POA: Diagnosis not present

## 2017-06-03 DIAGNOSIS — R809 Proteinuria, unspecified: Secondary | ICD-10-CM | POA: Diagnosis not present

## 2017-06-03 DIAGNOSIS — I10 Essential (primary) hypertension: Secondary | ICD-10-CM | POA: Diagnosis not present

## 2017-06-08 ENCOUNTER — Encounter: Payer: Self-pay | Admitting: Internal Medicine

## 2017-06-08 ENCOUNTER — Ambulatory Visit: Payer: PPO | Admitting: Internal Medicine

## 2017-06-08 VITALS — BP 122/70 | HR 78 | Ht 67.0 in | Wt 282.4 lb

## 2017-06-08 DIAGNOSIS — R0609 Other forms of dyspnea: Secondary | ICD-10-CM | POA: Diagnosis not present

## 2017-06-08 DIAGNOSIS — G4733 Obstructive sleep apnea (adult) (pediatric): Secondary | ICD-10-CM | POA: Diagnosis not present

## 2017-06-08 DIAGNOSIS — Z9989 Dependence on other enabling machines and devices: Secondary | ICD-10-CM | POA: Diagnosis not present

## 2017-06-08 DIAGNOSIS — R05 Cough: Secondary | ICD-10-CM | POA: Diagnosis not present

## 2017-06-08 DIAGNOSIS — J45991 Cough variant asthma: Secondary | ICD-10-CM

## 2017-06-08 DIAGNOSIS — R058 Other specified cough: Secondary | ICD-10-CM

## 2017-06-08 NOTE — Patient Instructions (Addendum)
OSA on CPAP  - glad you are doing well and better with all symptoms after restart cpap and figuring complianceo ut  - continue cpap and night o2  - ok to skip night o2 for few night due to travel but if prolonged recommend you talk to home care company about options  Cough variant asthma - stable - continue symbicort  Upper airway cough syndrome - this was big issue; now improved - slowly stop gabapentin as follows  - 120m twice daily for May 2019  - 1068monce daily for June 2019  - 10073mvery other day through July 2019  - and then stop  Dyspnea on exertion - due to multiple reasons - obesity , diastolic dysfunctip - refer pulm rehab   Followup 3 months or sooner

## 2017-06-08 NOTE — Progress Notes (Signed)
Subjective:     Patient ID: Kenneth Fuller, male   DOB: 01-04-1946, 72 y.o.   MRN: [id number]  PCP   HPI   IOV 01/26/2017  Chief Complaint  Patient presents with  . Advice Only    Referred by cardiology due to abn PFT. PFT done 12/23/16.     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  He is here for shortness of breath and cough.  History is somewhat poor and is gathered by talking to him and review of the past medical history in chart.  As best as I can gather he is to have asthma as a child but then he says he outgrew it.  For the last 5 years or so he is on Advair and Singulair for shortness of breath although he says specifically it is not meant to be for asthma or COPD.  However he also tells me that he is only been symptomatic for the last year or so.  He had insidious onset of shortness of breath for the last 1 year progressively worse.  Insidious onset of cough for the last 6 months and progressively worse.  Cough does not bother him at night.  But in the daytime he is coughing all the time it is a dry cough in quality.  He gets short of breath changing clothes and doing minimal activities class III exertion.  Relieved by rest no associated chest pain.  Chart review shows that he has had cardiac workup.  Echocardiogram October 2018 showed grade 2 diastolic dysfunction.  He then underwent cardiac catheterization October 2018 that was nonobstructive coronary artery disease.  Walking desaturation test on 01/26/2017 185 feet x 3 laps on ROOM AIR:  did not desaturate. Rest pulse ox was 99%, final pulse ox was 97%. HR response 67/min at rest to 81/min at peak exertion. Patient Kenneth Fuller  no Desaturate < 88% . Kenneth Fuller did not  Desaturated </= 3% points. Kenneth Fuller did not get tachyardic.  He did get pulmonary function test on December 23, 2016 that shows restriction with reduced diffusion capacity.  Total lung capacity 72% FVC, 1.6 L / 40% and DLCO of  19.4/68%.  Feno 01/26/2017 ->  8ppb and normal  CXR in 2018 personally visualied - suggestive of atelectas   ONO 01/28/17 -> on RA </ = 88% is 5h 39 minutes. STart 2LNC at night  02/10/2017 acute extended ov/Wert re: refractory cough >> sob  Chief Complaint  Patient presents with  . Acute Visit    Breathing has gradually been worse since his last visit. He is coughing more. Hard to produce sputum- yellow to green when he does. He has been waking up in the night coughing. He has been having to sleep in his recliner. He is using his albuterol inhaler 3 x per day on average.    cough is 24/7 and very minimally productive, severe fits to point of feeling choked and cannot lie back at all and breath comfortable more due to cough than sob so has been sleeping in recliner Insists onset was x 5 y and steadily downhill, no better on Advair hfais and effusions  Of note in October 2017 he was diagnosed to have very severe sleep apnea with apnea hypotony index of greater than 100/h and with desaturations.Kenneth Fuller  He is being managed by Dr. Ellouise Newer.  He tells me that no oxygen was prescribed.  He supposed to be using CPAP but he is noncompliant with  it.   03/11/2017 Follow up : Cough variant Asthma/Dyspnea  Patient presents for a 2-week follow-up.  Patient was seen in December for a pulmonary consult for progressive shortness of breath.  Patient complains over the last 5 years his breathing has been getting worse.  He gets more short of breath with activity.  He is also had associated severe violent cough that has been worse for the last several months.  Patient was set up for a high resolution CT chest that showed no evidence of interstitial lung disease.  Mild small airway trapping.  Pulmonary function test showed minimal airflow obstruction.  Moderate restriction with reduced diffusing capacity.  Exhaled nitric oxide testing was normal.  Advair was stopped due to upper airway irritation.  A CT sinus was clear.   Patient was seen in the office 2 weeks ago with worsening of symptoms and cough.  Patient was started on Delsym and tramadol for cough control.  He was started on gabapentin 100 mg 3 times daily to help with severe chronic cough.  Patient says his cough is improved significantly.  However has not resolved.  He has had no change in shortness of breath.  Patient has known very severe sleep apnea with previous sleep study showing AHI greater than 100.  He has been tried on CPAP but says he has not been able to tolerate very well.  He had a split-night study that showed optimal control with CPAP 5-20.  Patient says that he has trouble with with exhalation pressures.  Also mask is not comfortable and feels very dry.  We discussed the importance of controlling his sleep apnea and potential complications from severe sleep apnea.  We looked at several new mask and he would like to retry this.  Patient has known diastolic heart failure.  He is on Lasix daily.  But continues to have lower extremity edema.   Patient is followed by cardiology.  Notes were reviewed.  Patient was changed from metoprolol to bisoprolol in November however patient did not understand instructions and continued on both medications also in December metoprolol was increased from his primary care physician.   04/08/2017 Follow up : OSA  Patient returns for a one-month follow-up.  Patient has underlying severe sleep apnea has been having trouble with his CPAP.  Last visit he was changed to a new mask and restarted on CPAP.  Patient says he does not like his new mask and is having trouble with humidity issues and dry mouth.  Discussed various options to help with CPAP compliance.  Patient has ongoing dyspnea over the last 5 years he was set up for high resolution CT chest that showed no evidence of interstitial lung disease.  Some mild small airway trapping.  Pulmonary function test showed moderate to severe airflow obstruction and   Moderate  restriction with reduced diffusing capacity.  Exhaled nitric oxide testing was normal previously on Advair with no perceived benefit and potential upper airway irritation.  A CT sinus was done and was clear.  Patient was started on gabapentin for upper airway cough.  Patient noticed a significant decrease in cough . Cough is still not totally gone.  Denies any fever chest pain orthopnea PND or increased leg swelling.  He was previously on Advair.  He denies any flare cough or wheezing.  Patient says he had childhood asthma quite severe.     OV 06/08/2017  Chief Complaint  Patient presents with  . Follow-up    CPAP, asthma minor  flare ups but has needed to use rescue inhaler,     72 year old morbidly obese male follows for multiple issues chief of them includes  -Chronic cough: This is mostly irritable larynx syndrome upper airway cough syndrome.  At baseline even before I saw him in December 2018 he was already on Advair.  Therefore his exam nitric oxide to be normal.  In January 2019 Dr. Marlene Lard put him on gabapentin and with this the cough is resolved.  In addition he feels that starting CPAP therapy has helped his cough.  At this point in time since March 2019 with the help of nurse practitioner Tammy.  He is on Symbicort.  He is now willing to come down on the gabapentin.  His exam nitric oxide is 15 ppb and normal.  -Sleep apnea: This was not an issue that I was seeing him for but in the time that I saw him since December 2018 by nurse practitioner counseled him and and showed CPAP compliance.  With the CPAP compliance his cough is improved.  He was also found to have nocturnal desaturations and he takes oxygen at night.  He is wondering if he can skip the oxygen when he travels.  He says now he is taking his life seriously and will be compliant with his CPAP  -Shortness of breath: This is also significantly improved after controlling for the cough and CPAP sleep apnea and using nocturnal oxygen.   He is willing to undergo pulmonary rehab.      has a past medical history of Allergy, Asthma, CAD (coronary artery disease), Diabetes mellitus, Fibromyalgia, GERD (gastroesophageal reflux disease), GI bleeding, Gout, Hyperlipidemia, Hypertension, Hypogonadism male, MRSA cellulitis, Neuropathy, Obesity, Shortness of breath dyspnea, Sleep apnea, and Wheezing.   reports that he quit smoking about 48 years ago. He has quit using smokeless tobacco.  Past Surgical History:  Procedure Laterality Date  . BACK SURGERY    . CARDIAC CATHETERIZATION    . COLONOSCOPY    . LEFT HEART CATH AND CORONARY ANGIOGRAPHY N/A 12/02/2016   Procedure: LEFT HEART CATH AND CORONARY ANGIOGRAPHY;  Surgeon: Jettie Booze, MD;  Location: [facility];  Service: Cardiovascular;  Laterality: N/A;  . LUMBAR LAMINECTOMY/DECOMPRESSION MICRODISCECTOMY N/A 01/02/2014   Procedure: CENTRAL DECOMPRESSION LUMBAR LAMINECTOMY L3-L4, L4-L5;  Surgeon: Tobi Bastos, MD;  Location: [facility];  Service: Orthopedics;  Laterality: N/A;  . neck fusion      Allergies  Allergen Reactions  . Amlodipine Besy-Benazepril Hcl Swelling and Other (See Comments)    Makes tongue swell (lotrel)  . Phenergan [Promethazine Hcl] Other (See Comments)    "I can't remember."  . Hydrocodone Itching    Can tolerate in low doses    Immunization History  Administered Date(s) Administered  . Influenza Whole 10/09/2008  . Influenza, High Dose Seasonal PF 11/29/2016  . Influenza,inj,Quad PF,6+ Mos 12/04/2013, 12/04/2014, 12/23/2015  . Pneumococcal Conjugate-13 12/04/2013    Family History  Problem Relation Age of Onset  . Colon cancer Mother   . Diabetes Father        siblings  . Heart disease Father        brother  . Heart attack Father   . Kidney disease Sister   . Heart failure Sister   . Heart disease Brother   . Heart attack Son 39  . Drug abuse Sister   . Heart disease Brother   . Deep vein thrombosis Brother   .  Colon polyps Neg Hx  Current Outpatient Medications:  .  Albuterol Sulfate (PROAIR RESPICLICK) 768 (90 Base) MCG/ACT AEPB, Inhale 2 puffs into the lungs every 6 (six) hours as needed (for shortness of breath/ wheeze)., Disp: 1 each, Rfl: 0 .  atorvastatin (LIPITOR) 40 MG tablet, TAKE 1 TABLET BY MOUTH ONCE DAILY, Disp: 90 tablet, Rfl: 0 .  bisoprolol (ZEBETA) 10 MG tablet, Take 1 tablet (10 mg total) daily by mouth., Disp: 90 tablet, Rfl: 3 .  Blood Glucose Monitoring Suppl (PRODIGY BLOOD GLUCOSE MONITOR) W/DEVICE KIT, 1 Units by Does not apply route daily. Check blood sugars 1X per day-- dx 250.02, Disp: 1 each, Rfl: 0 .  budesonide-formoterol (SYMBICORT) 80-4.5 MCG/ACT inhaler, Inhale 2 puffs into the lungs 2 (two) times daily., Disp: 1 Inhaler, Rfl: 5 .  chlorpheniramine (CHLOR-TRIMETON) 2 MG/5ML syrup, Take 2 mg by mouth every 4 (four) hours as needed for allergies., Disp: , Rfl:  .  chlorthalidone (HYGROTON) 25 MG tablet, Take 1 tablet (25 mg total) by mouth daily., Disp: 30 tablet, Rfl: 3 .  Cholecalciferol (VITAMIN D PO), Take 1 tablet by mouth every morning., Disp: , Rfl:  .  clonazePAM (KLONOPIN) 0.5 MG tablet, Take 1 tablet (0.5 mg total) by mouth 2 (two) times daily as needed for anxiety., Disp: 20 tablet, Rfl: 1 .  Dextromethorphan Polistirex (DELSYM PO), Take by mouth as needed. , Disp: , Rfl:  .  furosemide (LASIX) 40 MG tablet, Take 2 tablets (80 mg total) every morning by mouth AND 1 tablet (40 mg total) every evening., Disp: 270 tablet, Rfl: 3 .  gabapentin (NEURONTIN) 100 MG capsule, Take 1 capsule (100 mg total) by mouth 3 (three) times daily. One three times daily, Disp: 90 capsule, Rfl: 2 .  glipiZIDE (GLUCOTROL XL) 5 MG 24 hr tablet, Take 1 tablet (5 mg total) by mouth daily with breakfast., Disp: 90 tablet, Rfl: 1 .  glucose blood test strip, Test 1X per day and prn One Touch Ultra test strips, Disp: 100 each, Rfl: 2 .  metFORMIN (GLUCOPHAGE) 1000 MG tablet, Take 1  tablet (1,000 mg total) by mouth 2 (two) times daily with a meal., Disp: 180 tablet, Rfl: 1 .  omeprazole (PRILOSEC) 40 MG capsule, TAKE 1 CAPSULE BY MOUTH ONCE DAILY, Disp: 90 capsule, Rfl: 1 .  ONETOUCH DELICA LANCETS 11X MISC, 1 each by Does not apply route daily. Test 1X per day and prn, Disp: 100 each, Rfl: 2 .  OXYGEN, 2lpm with sleep, Disp: , Rfl:  .  Polyethyl Glycol-Propyl Glycol (SYSTANE OP), Apply to eye., Disp: , Rfl:  .  potassium chloride (KLOR-CON M10) 10 MEQ tablet, Take 1 tablet (10 mEq total) 2 (two) times daily by mouth., Disp: 180 tablet, Rfl: 3 .  telmisartan (MICARDIS) 40 MG tablet, Take 1 tablet (40 mg total) by mouth daily., Disp: 90 tablet, Rfl: 2 .  traMADol (ULTRAM) 50 MG tablet, 1-2 every 4 hours as needed for cough or pain, Disp: 30 tablet, Rfl: 0 .  trimethoprim-polymyxin b (POLYTRIM) ophthalmic solution, Place 1 drop into the right eye every 4 (four) hours. Use for 7 days, Disp: 10 mL, Rfl: 0 .  Brimonidine Tartrate (LUMIFY OP), Apply to eye., Disp: , Rfl:    Review of Systems     Objective:   Physical Exam  Constitutional: He is oriented to person, place, and time. He appears well-developed and well-nourished. No distress.  Morbid obese  HENT:  Head: Normocephalic and atraumatic.  Right Ear: External ear normal.  Left Ear:  External ear normal.  Mouth/Throat: Oropharynx is clear and moist. No oropharyngeal exudate.  Eyes: Pupils are equal, round, and reactive to light. Conjunctivae and EOM are normal. Right eye exhibits no discharge. Left eye exhibits no discharge. No scleral icterus.  Neck: Normal range of motion. Neck supple. No JVD present. No tracheal deviation present. No thyromegaly present.  Cardiovascular: Normal rate, regular rhythm and intact distal pulses. Exam reveals no gallop and no friction rub.  No murmur heard. Pulmonary/Chest: Effort normal and breath sounds normal. No respiratory distress. He has no wheezes. He has no rales. He exhibits no  tenderness.  Abdominal: Soft. Bowel sounds are normal. He exhibits no distension and no mass. There is no tenderness. There is no rebound and no guarding.  Musculoskeletal: Normal range of motion. He exhibits no edema or tenderness.  Lymphadenopathy:    He has no cervical adenopathy.  Neurological: He is alert and oriented to person, place, and time. He has normal reflexes. No cranial nerve deficit. Coordination normal.  Skin: Skin is warm and dry. No rash noted. He is not diaphoretic. No erythema. No pallor.  Psychiatric: He has a normal mood and affect. His behavior is normal. Judgment and thought content normal.  Nursing note and vitals reviewed.  . Today's Vitals   06/08/17 1159 06/08/17 1200  BP:  122/70  Pulse:  78  SpO2:  96%  Weight: 282 lb 6.4 oz (128.1 kg)   Height: _0  (1.702 m)     Estimated body mass index is 44.23 kg/m as calculated from the following:   Height as of this encounter: _1  (1.702 m).   Weight as of this encounter: 282 lb 6.4 oz (128.1 kg).      Assessment:       ICD-10-CM   1. OSA on CPAP G47.33    Z99.89   2. Cough variant asthma J45.991 AMB referral to pulmonary rehabilitation  3. Upper airway cough syndrome R05   4. Dyspnea on exertion R06.09 AMB referral to pulmonary rehabilitation       Plan:     OSA on CPAP  - glad you are doing well and better with all symptoms after restart cpap and figuring complianceo ut  - continue cpap and night o2  - ok to skip night o2 for few night due to travel but if prolonged recommend you talk to home care company about options  Cough variant asthma - stable - continue symbicort  Upper airway cough syndrome - this was big issue; now improved - slowly stop gabapentin as follows  - 143m twice daily for May 2019  - 1025monce daily for June 2019  - 10022mvery other day through July 2019  - and then stop  Dyspnea on exertion - due to multiple reasons - obesity , diastolic dysfunctip - refer  pulm rehab   Followup 3 months or sooner    Dr. MurBrand Males.D., F.CWestern Nevada Surgical Center IncP Pulmonary and Critical Care Medicine Staff Physician, ConPicachorector - Interstitial Lung Disease  Program  Pulmonary FibBristow LebHermantown[id number]: [phone] no answer or between  15:00h - 7:00h: call [phone] Telephone: [phone]

## 2017-06-15 ENCOUNTER — Ambulatory Visit (INDEPENDENT_AMBULATORY_CARE_PROVIDER_SITE_OTHER): Payer: PPO | Admitting: Family Medicine

## 2017-06-15 ENCOUNTER — Encounter: Payer: Self-pay | Admitting: Family Medicine

## 2017-06-15 VITALS — BP 137/80 | HR 71 | Temp 98.1°F | Ht 67.0 in | Wt 285.0 lb

## 2017-06-15 DIAGNOSIS — E785 Hyperlipidemia, unspecified: Secondary | ICD-10-CM | POA: Diagnosis not present

## 2017-06-15 DIAGNOSIS — E1142 Type 2 diabetes mellitus with diabetic polyneuropathy: Secondary | ICD-10-CM | POA: Diagnosis not present

## 2017-06-15 DIAGNOSIS — E1159 Type 2 diabetes mellitus with other circulatory complications: Secondary | ICD-10-CM

## 2017-06-15 DIAGNOSIS — I10 Essential (primary) hypertension: Secondary | ICD-10-CM | POA: Diagnosis not present

## 2017-06-15 DIAGNOSIS — E1169 Type 2 diabetes mellitus with other specified complication: Secondary | ICD-10-CM

## 2017-06-15 DIAGNOSIS — I152 Hypertension secondary to endocrine disorders: Secondary | ICD-10-CM

## 2017-06-15 LAB — BAYER DCA HB A1C WAIVED: HB A1C (BAYER DCA - WAIVED): 6.6 % (ref <7.0)

## 2017-06-15 MED ORDER — GLIMEPIRIDE 4 MG PO TABS: 4.0000 mg | ORAL_TABLET | Freq: Every day | ORAL | 1 refills | Status: DC

## 2017-06-15 NOTE — Progress Notes (Signed)
BP 137/80   Pulse 71   Temp 98.1 F (36.7 C) (Oral)   Ht 5' 7" (1.702 m)   Wt 285 lb (129.3 kg)   BMI 44.64 kg/m    Subjective:    Patient ID: Kenneth Fuller, male    DOB: [birth year], 72 y.o.   MRN: [id number]  HPI: Kenneth Fuller is a 72 y.o. male presenting on 06/15/2017 for Diabetes (3 mo - would like to discuss taking Glimepiride; FYI pulmonologist discontinued Gabapentin last week, was told to taper off but patient went ahead and discontinued); Hyperlipidemia; and Hypertension   HPI Hypertension Patient is currently on telmisartan and bisoprolol and chlorthalidone, and their blood pressure today is 137/80. Patient denies any lightheadedness or dizziness. Patient denies headaches, blurred vision, chest pains, shortness of breath, or weakness. Denies any side effects from medication and is content with current medication.   Hyperlipidemia Patient is coming in for recheck of his hyperlipidemia. The patient is currently taking Lipitor. They deny any issues with myalgias or history of liver damage from it. They deny any focal numbness or weakness or chest pain.   Type 2 diabetes mellitus Patient comes in today for recheck of his diabetes. Patient has been currently taking glimepiride and metformin. Patient is currently on an ACE inhibitor/ARB. Patient has not seen an ophthalmologist this year. Patient denies any issues with their feet.  She does have neuropathy but it is stable currently  Relevant past medical, surgical, family and social history reviewed and updated as indicated. Interim medical history since our last visit reviewed. Allergies and medications reviewed and updated.  Review of Systems  Constitutional: Negative for chills and fever.  Respiratory: Negative for shortness of breath and wheezing.   Cardiovascular: Negative for chest pain and leg swelling.  Musculoskeletal: Negative for back pain and gait problem.  Skin: Negative for rash.  Neurological: Negative  for dizziness, weakness, light-headedness, numbness and headaches.  All other systems reviewed and are negative.   Per HPI unless specifically indicated above   Allergies as of 06/15/2017      Reactions   Amlodipine Besy-benazepril Hcl Swelling, Other (See Comments)   Makes tongue swell (lotrel)   Phenergan [promethazine Hcl] Other (See Comments)   "I can't remember."   Hydrocodone Itching   Can tolerate in low doses      Medication List        Accurate as of 06/15/17 11:59 PM. Always use your most recent med list.          Albuterol Sulfate 108 (90 Base) MCG/ACT Aepb Commonly known as:  PROAIR RESPICLICK Inhale 2 puffs into the lungs every 6 (six) hours as needed (for shortness of breath/ wheeze).   atorvastatin 40 MG tablet Commonly known as:  LIPITOR TAKE 1 TABLET BY MOUTH ONCE DAILY   bisoprolol 10 MG tablet Commonly known as:  ZEBETA Take 1 tablet (10 mg total) daily by mouth.   budesonide-formoterol 80-4.5 MCG/ACT inhaler Commonly known as:  SYMBICORT Inhale 2 puffs into the lungs 2 (two) times daily.   chlorpheniramine 2 MG/5ML syrup Commonly known as:  CHLOR-TRIMETON Take 2 mg by mouth every 4 (four) hours as needed for allergies.   chlorthalidone 25 MG tablet Commonly known as:  HYGROTON Take 1 tablet (25 mg total) by mouth daily.   clonazePAM 0.5 MG tablet Commonly known as:  KLONOPIN Take 1 tablet (0.5 mg total) by mouth 2 (two) times daily as needed for anxiety.   DELSYM PO Take by  mouth as needed.   furosemide 40 MG tablet Commonly known as:  LASIX Take 2 tablets (80 mg total) every morning by mouth AND 1 tablet (40 mg total) every evening.   glimepiride 4 MG tablet Commonly known as:  AMARYL Take 1 tablet (4 mg total) by mouth daily with breakfast.   glucose blood test strip Test 1X per day and prn One Touch Ultra test strips   LUMIFY OP Apply to eye.   metFORMIN 1000 MG tablet Commonly known as:  GLUCOPHAGE Take 1 tablet (1,000 mg  total) by mouth 2 (two) times daily with a meal.   omeprazole 40 MG capsule Commonly known as:  PRILOSEC TAKE 1 CAPSULE BY MOUTH ONCE DAILY   ONETOUCH DELICA LANCETS 59B Misc 1 each by Does not apply route daily. Test 1X per day and prn   OXYGEN 2lpm with sleep   potassium chloride 10 MEQ tablet Commonly known as:  KLOR-CON M10 Take 1 tablet (10 mEq total) 2 (two) times daily by mouth.   PRODIGY BLOOD GLUCOSE MONITOR w/Device Kit 1 Units by Does not apply route daily. Check blood sugars 1X per day-- dx 250.02   SYSTANE OP Apply to eye.   telmisartan 40 MG tablet Commonly known as:  MICARDIS Take 1 tablet (40 mg total) by mouth daily.   traMADol 50 MG tablet Commonly known as:  ULTRAM 1-2 every 4 hours as needed for cough or pain   trimethoprim-polymyxin b ophthalmic solution Commonly known as:  POLYTRIM Place 1 drop into the right eye every 4 (four) hours. Use for 7 days   VITAMIN D PO Take 1 tablet by mouth every morning.          Objective:    BP 137/80   Pulse 71   Temp 98.1 F (36.7 C) (Oral)   Ht _0  (1.702 m)   Wt 285 lb (129.3 kg)   BMI 44.64 kg/m   Wt Readings from Last 3 Encounters:  06/15/17 285 lb (129.3 kg)  06/08/17 282 lb 6.4 oz (128.1 kg)  05/31/17 282 lb (127.9 kg)    Physical Exam  Constitutional: He is oriented to person, place, and time. He appears well-developed and well-nourished. No distress.  Eyes: Conjunctivae are normal. No scleral icterus.  Neck: Neck supple. No thyromegaly present.  Cardiovascular: Normal rate, regular rhythm, normal heart sounds and intact distal pulses.  No murmur heard. Pulmonary/Chest: Effort normal and breath sounds normal. No respiratory distress. He has no wheezes.  Musculoskeletal: Normal range of motion. He exhibits no edema.  Lymphadenopathy:    He has no cervical adenopathy.  Neurological: He is alert and oriented to person, place, and time. Coordination normal.  Skin: Skin is warm and dry. No  rash noted. He is not diaphoretic.  Psychiatric: He has a normal mood and affect. His behavior is normal.  Nursing note and vitals reviewed.       Assessment & Plan:   Problem List Items Addressed This Visit      Cardiovascular and Mediastinum   Hypertension associated with diabetes ([place]) - Primary   Relevant Medications   glimepiride (AMARYL) 4 MG tablet     Endocrine   Diabetes (HCC)   Relevant Medications   glimepiride (AMARYL) 4 MG tablet   Other Relevant Orders   Bayer DCA Hb A1c Waived (Completed)   Hyperlipidemia associated with type 2 diabetes mellitus (HCC)   Relevant Medications   glimepiride (AMARYL) 4 MG tablet     Other  Morbid obesity due to excess calories (HCC)   Relevant Medications   glimepiride (AMARYL) 4 MG tablet       Follow up plan: Return in about 3 months (around 09/15/2017), or if symptoms worsen or fail to improve, for Hypertension and cholesterol.  Counseling provided for all of the vaccine components Orders Placed This Encounter  Procedures  . Bayer [facility] Hb A1c [place], MD [facility] 06/21/2017, 9:27 PM

## 2017-06-16 DIAGNOSIS — R0600 Dyspnea, unspecified: Secondary | ICD-10-CM | POA: Diagnosis not present

## 2017-06-16 DIAGNOSIS — R0902 Hypoxemia: Secondary | ICD-10-CM | POA: Diagnosis not present

## 2017-06-20 ENCOUNTER — Other Ambulatory Visit (HOSPITAL_COMMUNITY): Payer: Self-pay | Admitting: Medical

## 2017-06-20 DIAGNOSIS — N183 Chronic kidney disease, stage 3 unspecified: Secondary | ICD-10-CM

## 2017-06-30 ENCOUNTER — Ambulatory Visit (HOSPITAL_COMMUNITY)
Admission: RE | Admit: 2017-06-30 | Discharge: 2017-06-30 | Disposition: A | Discharge status: Home / Self Care | Payer: PPO | Source: Ambulatory Visit | Attending: Medical | Admitting: Medical

## 2017-06-30 DIAGNOSIS — N183 Chronic kidney disease, stage 3 unspecified: Secondary | ICD-10-CM

## 2017-07-01 ENCOUNTER — Encounter: Payer: Self-pay | Admitting: Family Medicine

## 2017-07-01 ENCOUNTER — Ambulatory Visit (INDEPENDENT_AMBULATORY_CARE_PROVIDER_SITE_OTHER): Payer: PPO | Admitting: Family Medicine

## 2017-07-01 VITALS — BP 131/84 | HR 88 | Temp 97.7°F | Ht 67.0 in | Wt 282.0 lb

## 2017-07-01 DIAGNOSIS — J4521 Mild intermittent asthma with (acute) exacerbation: Secondary | ICD-10-CM

## 2017-07-01 DIAGNOSIS — J40 Bronchitis, not specified as acute or chronic: Secondary | ICD-10-CM | POA: Diagnosis not present

## 2017-07-01 MED ORDER — PREDNISONE 20 MG PO TABS: ORAL_TABLET | ORAL | 0 refills | Status: DC

## 2017-07-01 MED ORDER — AMOXICILLIN-POT CLAVULANATE 875-125 MG PO TABS
1.0000 | ORAL_TABLET | Freq: Two times a day (BID) | ORAL | 0 refills | Status: DC
Start: 2017-07-01 — End: 2017-07-11

## 2017-07-01 MED ORDER — CLONAZEPAM 0.5 MG PO TABS: 0.5000 mg | ORAL_TABLET | Freq: Two times a day (BID) | ORAL | 1 refills | Status: DC | PRN

## 2017-07-01 NOTE — Progress Notes (Signed)
BP 131/84   Pulse 88   Temp 97.7 F (36.5 C) (Oral)   Ht _0  (1.702 m)   Wt 282 lb (127.9 kg)   SpO2 95%   BMI 44.17 kg/m    Subjective:    Patient ID: Kenneth Fuller, male    DOB: 07-21-1945, 72 y.o.   MRN: [id number]  HPI: Kenneth Fuller is a 72 y.o. male presenting on 07/01/2017 for Sinusitis (nasal congestion, runny nose x 3 days; taking Sudafed) and Cough   HPI Sinus congestion and cough and runny nose and wheezing Patient is coming in with sinus congestion and cough and runny nose and wheezing that has been going on for the past 3 days.  He does have chronic asthma and frequently gets flareups this time a year.  He has been having low-grade temperatures as well along with this and he is just feeling really ill.  He has been using his inhalers and they have been helping some.  He denies any sick contacts that he knows of but he does work as a Regulatory affairs officer.  He feels like it is worsening and he is getting more more chest congestion  Relevant past medical, surgical, family and social history reviewed and updated as indicated. Interim medical history since our last visit reviewed. Allergies and medications reviewed and updated.  Review of Systems  Constitutional: Positive for fever. Negative for chills.  HENT: Positive for congestion, postnasal drip, rhinorrhea, sinus pressure, sneezing and sore throat. Negative for ear discharge, ear pain and voice change.   Eyes: Negative for pain, discharge, redness and visual disturbance.  Respiratory: Positive for cough. Negative for shortness of breath and wheezing.   Cardiovascular: Negative for chest pain and leg swelling.  Musculoskeletal: Negative for gait problem.  Skin: Negative for rash.  All other systems reviewed and are negative.   Per HPI unless specifically indicated above   Allergies as of 07/01/2017      Reactions   Amlodipine Besy-benazepril Hcl Swelling, Other (See Comments)   Makes tongue swell  (lotrel)   Phenergan [promethazine Hcl] Other (See Comments)   "I can't remember."   Hydrocodone Itching   Can tolerate in low doses      Medication List        Accurate as of 07/01/17  8:30 AM. Always use your most recent med list.          Albuterol Sulfate 108 (90 Base) MCG/ACT Aepb Commonly known as:  PROAIR RESPICLICK Inhale 2 puffs into the lungs every 6 (six) hours as needed (for shortness of breath/ wheeze).   amoxicillin-clavulanate 875-125 MG tablet Commonly known as:  AUGMENTIN Take 1 tablet by mouth 2 (two) times daily.   atorvastatin 40 MG tablet Commonly known as:  LIPITOR TAKE 1 TABLET BY MOUTH ONCE DAILY   bisoprolol 10 MG tablet Commonly known as:  ZEBETA Take 1 tablet (10 mg total) daily by mouth.   budesonide-formoterol 80-4.5 MCG/ACT inhaler Commonly known as:  SYMBICORT Inhale 2 puffs into the lungs 2 (two) times daily.   chlorthalidone 25 MG tablet Commonly known as:  HYGROTON Take 1 tablet (25 mg total) by mouth daily.   clonazePAM 0.5 MG tablet Commonly known as:  KLONOPIN Take 1 tablet (0.5 mg total) by mouth 2 (two) times daily as needed for anxiety.   DELSYM PO Take by mouth as needed.   furosemide 40 MG tablet Commonly known as:  LASIX Take 2 tablets (80 mg total) every  morning by mouth AND 1 tablet (40 mg total) every evening.   glimepiride 4 MG tablet Commonly known as:  AMARYL Take 1 tablet (4 mg total) by mouth daily with breakfast.   glucose blood test strip Test 1X per day and prn One Touch Ultra test strips   LUMIFY OP Apply to eye.   metFORMIN 1000 MG tablet Commonly known as:  GLUCOPHAGE Take 1 tablet (1,000 mg total) by mouth 2 (two) times daily with a meal.   omeprazole 40 MG capsule Commonly known as:  PRILOSEC TAKE 1 CAPSULE BY MOUTH ONCE DAILY   ONETOUCH DELICA LANCETS 16X Misc 1 each by Does not apply route daily. Test 1X per day and prn   OXYGEN 2lpm with sleep   potassium chloride 10 MEQ  tablet Commonly known as:  KLOR-CON M10 Take 1 tablet (10 mEq total) 2 (two) times daily by mouth.   predniSONE 20 MG tablet Commonly known as:  DELTASONE 2 po at same time daily for 5 days   PRODIGY BLOOD GLUCOSE MONITOR w/Device Kit 1 Units by Does not apply route daily. Check blood sugars 1X per day-- dx 250.02   SYSTANE OP Apply to eye.   telmisartan 40 MG tablet Commonly known as:  MICARDIS Take 1 tablet (40 mg total) by mouth daily.   traMADol 50 MG tablet Commonly known as:  ULTRAM 1-2 every 4 hours as needed for cough or pain   trimethoprim-polymyxin b ophthalmic solution Commonly known as:  POLYTRIM Place 1 drop into the right eye every 4 (four) hours. Use for 7 days   VITAMIN D PO Take 1 tablet by mouth every morning.          Objective:    BP 131/84   Pulse 88   Temp 97.7 F (36.5 C) (Oral)   Ht 5' 7" (1.702 m)   Wt 282 lb (127.9 kg)   SpO2 95%   BMI 44.17 kg/m   Wt Readings from Last 3 Encounters:  07/01/17 282 lb (127.9 kg)  06/15/17 285 lb (129.3 kg)  06/08/17 282 lb 6.4 oz (128.1 kg)    Physical Exam  Constitutional: He is oriented to person, place, and time. He appears well-developed and well-nourished. No distress.  HENT:  Right Ear: Tympanic membrane, external ear and ear canal normal.  Left Ear: Tympanic membrane, external ear and ear canal normal.  Nose: Mucosal edema and rhinorrhea present. No sinus tenderness. No epistaxis. Right sinus exhibits maxillary sinus tenderness. Right sinus exhibits no frontal sinus tenderness. Left sinus exhibits maxillary sinus tenderness. Left sinus exhibits no frontal sinus tenderness.  Mouth/Throat: Uvula is midline and mucous membranes are normal. Posterior oropharyngeal edema and posterior oropharyngeal erythema present. No oropharyngeal exudate or tonsillar abscesses.  Eyes: Pupils are equal, round, and reactive to light. Conjunctivae and EOM are normal. Right eye exhibits no discharge. No scleral  icterus.  Neck: Neck supple. No thyromegaly present.  Cardiovascular: Normal rate, regular rhythm, normal heart sounds and intact distal pulses.  No murmur heard. Pulmonary/Chest: Effort normal. No respiratory distress. He has wheezes. He has no rales.  Musculoskeletal: Normal range of motion. He exhibits no edema.  Lymphadenopathy:    He has no cervical adenopathy.  Neurological: He is alert and oriented to person, place, and time. Coordination normal.  Skin: Skin is warm and dry. No rash noted. He is not diaphoretic.  Psychiatric: He has a normal mood and affect. His behavior is normal.  Nursing note and vitals reviewed.  Results for orders placed or performed in visit on 06/15/17  Bayer DCA Hb A1c Waived  Result Value Ref Range   HB A1C (BAYER DCA - WAIVED) 6.6 <7.0 %      Assessment & Plan:   Problem List Items Addressed This Visit    None    Visit Diagnoses    Bronchitis    -  Primary   Relevant Medications   amoxicillin-clavulanate (AUGMENTIN) 875-125 MG tablet   predniSONE (DELTASONE) 20 MG tablet   Mild intermittent asthma with exacerbation       Relevant Medications   amoxicillin-clavulanate (AUGMENTIN) 875-125 MG tablet   predniSONE (DELTASONE) 20 MG tablet       Follow up plan: Return if symptoms worsen or fail to improve.  Counseling provided for all of the vaccine components No orders of the defined types were placed in this encounter.   Caryl Pina, MD [facility] 07/01/2017, 8:30 AM

## 2017-07-04 ENCOUNTER — Emergency Department (HOSPITAL_COMMUNITY)
Admission: EM | Admit: 2017-07-04 | Discharge: 2017-07-04 | Disposition: A | Discharge status: Home / Self Care | Payer: PPO | Attending: Emergency Medicine | Admitting: Emergency Medicine

## 2017-07-04 ENCOUNTER — Emergency Department (HOSPITAL_COMMUNITY): Payer: PPO

## 2017-07-04 ENCOUNTER — Other Ambulatory Visit: Payer: Self-pay

## 2017-07-04 ENCOUNTER — Encounter (HOSPITAL_COMMUNITY): Payer: Self-pay | Admitting: Emergency Medicine

## 2017-07-04 DIAGNOSIS — Z79899 Other long term (current) drug therapy: Secondary | ICD-10-CM | POA: Insufficient documentation

## 2017-07-04 DIAGNOSIS — Z7984 Long term (current) use of oral hypoglycemic drugs: Secondary | ICD-10-CM | POA: Insufficient documentation

## 2017-07-04 DIAGNOSIS — J40 Bronchitis, not specified as acute or chronic: Secondary | ICD-10-CM | POA: Insufficient documentation

## 2017-07-04 DIAGNOSIS — R079 Chest pain, unspecified: Secondary | ICD-10-CM | POA: Diagnosis not present

## 2017-07-04 DIAGNOSIS — Z87891 Personal history of nicotine dependence: Secondary | ICD-10-CM | POA: Diagnosis not present

## 2017-07-04 DIAGNOSIS — I110 Hypertensive heart disease with heart failure: Secondary | ICD-10-CM | POA: Insufficient documentation

## 2017-07-04 DIAGNOSIS — E119 Type 2 diabetes mellitus without complications: Secondary | ICD-10-CM | POA: Diagnosis not present

## 2017-07-04 DIAGNOSIS — R5383 Other fatigue: Secondary | ICD-10-CM | POA: Diagnosis not present

## 2017-07-04 DIAGNOSIS — R0981 Nasal congestion: Secondary | ICD-10-CM | POA: Diagnosis not present

## 2017-07-04 DIAGNOSIS — I5032 Chronic diastolic (congestive) heart failure: Secondary | ICD-10-CM | POA: Insufficient documentation

## 2017-07-04 DIAGNOSIS — R0602 Shortness of breath: Secondary | ICD-10-CM | POA: Diagnosis not present

## 2017-07-04 LAB — CBC
HEMATOCRIT: 35 % — AB (ref 39.0–52.0)
Hemoglobin: 11.4 g/dL — ABNORMAL LOW (ref 13.0–17.0)
MCH: 28.1 pg (ref 26.0–34.0)
MCHC: 32.6 g/dL (ref 30.0–36.0)
MCV: 86.4 fL (ref 78.0–100.0)
Platelets: 268 10*3/uL (ref 150–400)
RBC: 4.05 MIL/uL — ABNORMAL LOW (ref 4.22–5.81)
RDW: 15.4 % (ref 11.5–15.5)
WBC: 11.6 10*3/uL — ABNORMAL HIGH (ref 4.0–10.5)

## 2017-07-04 LAB — BASIC METABOLIC PANEL
Anion gap: 13 (ref 5–15)
BUN: 31 mg/dL — AB (ref 6–20)
CO2: 25 mmol/L (ref 22–32)
CREATININE: 1.65 mg/dL — AB (ref 0.61–1.24)
Calcium: 8.4 mg/dL — ABNORMAL LOW (ref 8.9–10.3)
Chloride: 96 mmol/L — ABNORMAL LOW (ref 101–111)
GFR calc Af Amer: 47 mL/min — ABNORMAL LOW (ref >60)
GFR, EST NON AFRICAN AMERICAN: 40 mL/min — AB (ref >60)
Glucose, Bld: 281 mg/dL — ABNORMAL HIGH (ref 65–99)
POTASSIUM: 4 mmol/L (ref 3.5–5.1)
Sodium: 134 mmol/L — ABNORMAL LOW (ref 135–145)

## 2017-07-04 LAB — TROPONIN I: Troponin I: <0.03 ng/mL (ref <0.03)

## 2017-07-04 MED ORDER — SODIUM CHLORIDE 0.9 % IV BOLUS
500.0000 mL | Freq: Once | INTRAVENOUS | Status: AC
  Administered 2017-07-04: 500 mL via INTRAVENOUS

## 2017-07-04 MED ORDER — PREDNISONE 10 MG PO TABS: 40.0000 mg | ORAL_TABLET | Freq: Every day | ORAL | 0 refills | Status: DC

## 2017-07-04 MED ORDER — IOPAMIDOL (ISOVUE-370) INJECTION 76%
100.0000 mL | Freq: Once | INTRAVENOUS | Status: AC | PRN
  Administered 2017-07-04: 100 mL via INTRAVENOUS

## 2017-07-04 MED ORDER — SODIUM CHLORIDE 0.9 % IV SOLN
INTRAVENOUS | Status: DC
  Administered 2017-07-04: 21:00:00 via INTRAVENOUS

## 2017-07-04 MED ORDER — DM-GUAIFENESIN ER 30-600 MG PO TB12: 1.0000 | ORAL_TABLET | Freq: Two times a day (BID) | ORAL | 1 refills | Status: DC

## 2017-07-04 NOTE — ED Triage Notes (Signed)
Patient complaining of shortness of breath since mowing his grass last week. States he saw PCP last week and was given prednisone and ampicillin. States shortness of breath is worse today, he's been outside a lot today.

## 2017-07-04 NOTE — Discharge Instructions (Addendum)
Use the albuterol inhaler 2 puffs every 6 hours.  Take the Mucinex every 12 hours to help clear the mucus and suppress the cough.  Continue the prednisone for another 5 days.  Feel that you can probably stop the antibiotic.  Make an appointment to follow-up with your doctor.  Return for any new or worse symptoms.  CT scan was negative suspect this is a bronchitis.

## 2017-07-04 NOTE — ED Provider Notes (Signed)
[facility] EMERGENCY DEPARTMENT Provider Note   CSN: [id number] Arrival date & time: 07/04/17  1732     History   Chief Complaint No chief complaint on file.   HPI Kenneth Fuller is a 72 y.o. male.  Patient 72 year old male complaining of shortness of breath since mowing his grass last week.  States he saw his primary care provider last week was given prednisone and Augmentin states that shortness of breath is worse today.  He is been outside a lot today.  Patient uses oxygen at night with his CPAP.  But he has been very consistent with this.  Patient's had some problems with diarrhea and he has been on Augmentin for the past week.  No improvement in his shortness of breath.     Past Medical History:  Diagnosis Date  . Allergy   . Asthma   . CAD (coronary artery disease)   . Diabetes mellitus   . Fibromyalgia   . GERD (gastroesophageal reflux disease)   . GI bleeding   . Gout   . Hyperlipidemia   . Hypertension   . Hypogonadism male   . MRSA cellulitis   . Neuropathy   . Obesity   . Shortness of breath dyspnea    with exertion   . Sleep apnea    cpap- 14   . Wheezing    no asthma diagnosis    Patient Active Problem List   Diagnosis Date Noted  . Chronic diastolic heart failure ([place]) 04/13/2017  . Chronic respiratory failure with hypoxia and hypercapnia ([place]) likely  [facility]  02/25/2017  . Swelling of lower extremity 11/30/2016  . Avulsion of left subscapularis 12/28/2015  . C1 cervical fracture ([place]) 12/27/2015  . Morbid obesity due to excess calories ([place]) 08/21/2015  . Urinary urgency 10/15/2014  . Spinal stenosis, lumbar region, with neurogenic claudication 01/02/2014  . Hyperlipidemia associated with type 2 diabetes mellitus ([place]) 10/25/2013  . COLONIC POLYPS, ADENOMATOUS 02/25/2007  . Diabetes ([place]) 02/25/2007  . Gout 02/25/2007  . Anxiety state 02/25/2007  . Hypertension associated with diabetes ([place]) 02/25/2007  . Cough variant asthma 02/25/2007    . OSA on CPAP 02/25/2007  . FATIGUE, CHRONIC 02/25/2007  . HEADACHE, CHRONIC 02/25/2007  . GERD (gastroesophageal reflux disease) 02/25/2007    Past Surgical History:  Procedure Laterality Date  . BACK SURGERY    . CARDIAC CATHETERIZATION    . COLONOSCOPY    . LEFT HEART CATH AND CORONARY ANGIOGRAPHY N/A 12/02/2016   Procedure: LEFT HEART CATH AND CORONARY ANGIOGRAPHY;  Surgeon: Jettie Booze, MD;  Location: [facility];  Service: Cardiovascular;  Laterality: N/A;  . LUMBAR LAMINECTOMY/DECOMPRESSION MICRODISCECTOMY N/A 01/02/2014   Procedure: CENTRAL DECOMPRESSION LUMBAR LAMINECTOMY L3-L4, L4-L5;  Surgeon: Tobi Bastos, MD;  Location: [facility];  Service: Orthopedics;  Laterality: N/A;  . neck fusion          Home Medications    Prior to Admission medications   Medication Sig Start Date End Date Taking? Authorizing Provider  Albuterol Sulfate (PROAIR RESPICLICK) 947 (90 Base) MCG/ACT AEPB Inhale 2 puffs into the lungs every 6 (six) hours as needed (for shortness of breath/ wheeze). 05/31/17  Yes Ronnie Doss M, DO  amoxicillin-clavulanate (AUGMENTIN) 875-125 MG tablet Take 1 tablet by mouth 2 (two) times daily. 07/01/17  Yes [name], [name], MD  atorvastatin (LIPITOR) 40 MG tablet TAKE 1 TABLET BY MOUTH ONCE DAILY 05/16/17  Yes [name], [name], MD  bisoprolol (ZEBETA) 10 MG tablet  Take 1 tablet (10 mg total) daily by mouth. 12/27/16  Yes Croitoru, Mihai, MD  budesonide-formoterol (SYMBICORT) 80-4.5 MCG/ACT inhaler Inhale 2 puffs into the lungs 2 (two) times daily. Patient taking differently: Inhale 2 puffs into the lungs daily as needed.  04/08/17  Yes Parrett, Tammy S, NP  chlorthalidone (HYGROTON) 25 MG tablet Take 1 tablet (25 mg total) by mouth daily. 03/15/17  Yes [name], [name], MD  Cholecalciferol (VITAMIN D PO) Take 1 tablet by mouth every morning.   Yes [provider]  clonazePAM (KLONOPIN) 0.5 MG tablet Take 1 tablet (0.5 mg total) by  mouth 2 (two) times daily as needed for anxiety. 07/01/17  Yes [name], [name], MD  furosemide (LASIX) 40 MG tablet Take 2 tablets (80 mg total) every morning by mouth AND 1 tablet (40 mg total) every evening. Patient taking differently: TAKE ONE TABLET BY MOUTH DAILY AS NEEDED FOR FLUID RETENTION 12/13/16  Yes Croitoru, Mihai, MD  glimepiride (AMARYL) 4 MG tablet Take 1 tablet (4 mg total) by mouth daily with breakfast. 06/15/17  Yes [name], [name], MD  metFORMIN (GLUCOPHAGE) 1000 MG tablet Take 1 tablet (1,000 mg total) by mouth 2 (two) times daily with a meal. 01/28/17  Yes [name], [name], MD  omeprazole (PRILOSEC) 40 MG capsule TAKE 1 CAPSULE BY MOUTH ONCE DAILY 03/28/17  Yes [name], [name], MD  OXYGEN Inhale 2 L into the lungs at bedtime. 2lpm with sleep    Yes [provider]  potassium chloride (KLOR-CON M10) 10 MEQ tablet Take 1 tablet (10 mEq total) 2 (two) times daily by mouth. 12/13/16  Yes Croitoru, Mihai, MD  predniSONE (DELTASONE) 20 MG tablet 2 po at same time daily for 5 days Patient taking differently: Take 40 mg by mouth daily. 2 po at same time daily for 5 days 07/01/17  Yes [name], [name], MD  telmisartan (MICARDIS) 40 MG tablet Take 1 tablet (40 mg total) by mouth daily. 04/25/17  Yes [name], [name], MD  Blood Glucose Monitoring Suppl (PRODIGY BLOOD GLUCOSE MONITOR) W/DEVICE KIT 1 Units by Does not apply route daily. Check blood sugars 1X per day-- dx 250.02 08/18/12   Hassell Done, Mary-Margaret, FNP  dextromethorphan-guaiFENesin [facility] DM) 30-600 MG 12hr tablet Take 1 tablet by mouth 2 (two) times daily. 07/04/17   Fredia Sorrow, MD  glucose blood test strip Test 1X per day and prn One Touch Ultra test strips 03/30/17   [name], [name], MD  [facility] DELICA LANCETS 26V MISC 1 each by Does not apply route daily. Test 1X per day and prn 03/30/17   [name], [name], MD  Polyethyl Glycol-Propyl Glycol (SYSTANE OP) Apply to eye.    [provider]  predniSONE (DELTASONE) 10 MG tablet Take 4 tablets (40 mg total) by mouth daily. 07/04/17   Fredia Sorrow, MD    Family History Family History  Problem Relation Age of Onset  . Colon cancer Mother   . Diabetes Father        siblings  . Heart disease Father        brother  . Heart attack Father   . Kidney disease Sister   . Heart failure Sister   . Heart disease Brother   . Heart attack Son 75  . Drug abuse Sister   . Heart disease Brother   . Deep vein thrombosis Brother   . Colon polyps Neg Hx     Social History Social History   Tobacco Use  .  Smoking status: Former Smoker    Last attempt to quit: 02/08/1969    Years since quitting: 48.4  . Smokeless tobacco: Former Network engineer Use Topics  . Alcohol use: No    Alcohol/week: 0.0 oz  . Drug use: No     Allergies   Amlodipine besy-benazepril hcl; Phenergan [promethazine hcl]; and Hydrocodone   Review of Systems Review of Systems  Constitutional: Positive for fatigue. Negative for fever.  HENT: Positive for congestion. Negative for sore throat.   Eyes: Negative for visual disturbance.  Respiratory: Positive for cough and shortness of breath.   Cardiovascular: Negative for chest pain.  Gastrointestinal: Positive for diarrhea. Negative for abdominal pain, nausea and vomiting.  Genitourinary: Negative for dysuria.  Musculoskeletal: Negative for back pain.  Neurological: Negative for headaches.  Hematological: Does not bruise/bleed easily.  Psychiatric/Behavioral: Negative for confusion.     Physical Exam Updated Vital Signs BP (!) 151/86   Pulse 86   Temp 98.3 F (36.8 C) (Oral)   Resp 18   Ht 1.702 m (_0 )   Wt 127.9 kg (282 lb)   SpO2 97%   BMI 44.17 kg/m   Physical Exam  Constitutional: He is oriented to person, place, and time. He appears well-developed and well-nourished. He appears distressed.  HENT:  Head: Normocephalic and atraumatic.  Mouth/Throat: Oropharynx is clear and moist.    Eyes: Pupils are equal, round, and reactive to light. Conjunctivae and EOM are normal.  Neck: Normal range of motion. Neck supple.  Cardiovascular: Normal rate.  Pulmonary/Chest: Effort normal and breath sounds normal. He has no wheezes. He has no rales.  Abdominal: Soft. Bowel sounds are normal. There is no tenderness.  Musculoskeletal: Normal range of motion. He exhibits no edema.  Neurological: He is alert and oriented to person, place, and time. No cranial nerve deficit or sensory deficit. He exhibits normal muscle tone. Coordination normal.  Nursing note and vitals reviewed.    ED Treatments / Results  Labs (all labs ordered are listed, but only abnormal results are displayed) Labs Reviewed  BASIC METABOLIC PANEL - Abnormal; Notable for the following components:      Result Value   Sodium 134 (*)    Chloride 96 (*)    Glucose, Bld 281 (*)    BUN 31 (*)    Creatinine, Ser 1.65 (*)    Calcium 8.4 (*)    GFR calc non Af Amer 40 (*)    GFR calc Af Amer 47 (*)    All other components within normal limits  CBC - Abnormal; Notable for the following components:   WBC 11.6 (*)    RBC 4.05 (*)    Hemoglobin 11.4 (*)    HCT 35.0 (*)    All other components within normal limits  TROPONIN I    EKG EKG Interpretation  Date/Time:  Monday Jul 04 2017 17:38:10 EDT Ventricular Rate:  96 PR Interval:  142 QRS Duration: 92 QT Interval:  374 QTC Calculation: 472 R Axis:   52 Text Interpretation:  Normal sinus rhythm Normal ECG Confirmed by Fredia Sorrow [phone]) on 07/04/2017 7:13:54 PM   Radiology Dg Chest 2 View  Result Date: 07/04/2017 CLINICAL DATA:  Acute onset of shortness of breath and generalized chest pain. EXAM: CHEST - 2 VIEW COMPARISON:  Chest radiograph performed 01/14/2017, and CT of the chest performed 02/02/2017 FINDINGS: The lungs are well-aerated. Mild left basilar airspace opacity likely reflects atelectasis. There is no evidence of pleural effusion or  pneumothorax. The heart is normal in size; the mediastinal contour is within normal limits. No acute osseous abnormalities are seen. Cervical spinal fusion hardware is partially imaged. IMPRESSION: Mild left basilar airspace opacity likely reflects atelectasis; lungs otherwise clear. Electronically Signed   By: Garald Balding M.D.   On: 07/04/2017 18:29   Ct Angio Chest Pe W/cm &/or Wo Cm  Result Date: 07/04/2017 CLINICAL DATA:  Worsening shortness of breath over the past week. Ex-smoker. EXAM: CT ANGIOGRAPHY CHEST WITH CONTRAST TECHNIQUE: Multidetector CT imaging of the chest was performed using the standard protocol during bolus administration of intravenous contrast. Multiplanar CT image reconstructions and MIPs were obtained to evaluate the vascular anatomy. CONTRAST:  173m ISOVUE-370 IOPAMIDOL (ISOVUE-370) INJECTION 76% COMPARISON:  Chest radiographs obtained earlier today. High-resolution chest CT dated 02/02/2017. FINDINGS: Cardiovascular: Mild atheromatous arterial calcifications, including the thoracic aorta. Normally opacified pulmonary arteries with no pulmonary arterial filling defects seen. Mediastinum/Nodes: Minimally prominent subcarinal and right hilar lymph nodes without significant change. A subcarinal node measures 9 mm in short axis diameter on image number 45 series 4. A right hilar node measures 8 mm in short axis diameter on image number 48 series 4. Unremarkable esophagus and thyroid gland. Lungs/Pleura: Mild linear scarring at the left lung base without significant change. No pleural fluid or airspace consolidation. Prominent extrapleural fat at the posterior left lung base. Upper Abdomen: Mild diffuse low density of the liver with mild diffuse enlargement of the liver, including all lobes. Minimal, tiny punctate calcified granulomata in the liver and spleen. Proximal descending colon diverticulosis. Musculoskeletal: Thoracic spine degenerative changes with anterior fusion at multiple  levels. Cervical spine fixation hardware. Review of the MIP images confirms the above findings. IMPRESSION: 1. No pulmonary emboli or acute abnormality. 2. Stable minimally prominent subcarinal and right hilar lymph nodes with no definite pathologically enlarged lymph nodes. 3. Stable hepatomegaly with minimal steatosis. 4. Mild calcific aortic atherosclerosis. Aortic Atherosclerosis (ICD10-I70.0). Electronically Signed   By: SClaudie ReveringM.D.   On: 07/04/2017 20:57    Procedures Procedures (including critical care time)  Medications Ordered in ED Medications  0.9 %  sodium chloride infusion ( Intravenous New Bag/Given 07/04/17 2046)  sodium chloride 0.9 % bolus 500 mL (0 mLs Intravenous Stopped 07/04/17 2123)  iopamidol (ISOVUE-370) 76 % injection 100 mL (100 mLs Intravenous Contrast Given 07/04/17 2027)     Initial Impression / Assessment and Plan / ED Course  I have reviewed the triage vital signs and the nursing notes.  Pertinent labs & imaging results that were available during my care of the patient were reviewed by me and considered in my medical decision making (see chart for details).     With extensive work-up for the shortness of breath.  No evidence of pulmonary embolus.  No ends of pneumonia.  Suspect this is probably a bronchitis treated with steroids and albuterol nebulizers here with some improvement.  Will be continued on Mucinex DM and prednisone and an albuterol inhaler.  Close follow-up by his primary care doctor.  Probably does not need to take the Augmentin anymore.  In addition patient's troponin was negative.  No evidence of an acute cardiac event.  Final Clinical Impressions(s) / ED Diagnoses   Final diagnoses:  Bronchitis    ED Discharge Orders        Ordered    dextromethorphan-guaiFENesin (MUCINEX DM) 30-600 MG 12hr tablet  2 times daily     07/04/17 2242    predniSONE (DELTASONE) 10 MG tablet  Daily     07/04/17 2242       Fredia Sorrow, MD 07/07/17  [phone]

## 2017-07-04 NOTE — ED Notes (Signed)
Pt tolerated ambulation well with o2 sats 98-100%.

## 2017-07-08 ENCOUNTER — Encounter: Payer: Self-pay | Admitting: *Deleted

## 2017-07-08 ENCOUNTER — Other Ambulatory Visit: Payer: Self-pay | Admitting: *Deleted

## 2017-07-08 NOTE — Patient Outreach (Signed)
Initial telephone outreach for [facility] referal. Pt was recently in ED for SOB, hx asthma, this episode thought to be bronchitis. Pt had been outside all day in the extreme heat and became very SOB. He did call MD office but it was closed for [facility] Day and he went to the ED. He had been on an antibiotic and the ER MD felt he didn't need to continue this but did start him on a prednisone pack and mucinex.  When pt answered the phone today, he was clearly SOB, unable to complete a sentence without stopping for a breath. He was also outside working in the heat.  I advised him of our program and referral from [facility]. He agreed to participate and be a "good student."  I asked him if he had his inhalers and if he was taking them as directed. He said he had them but only uses them if he feels he needs them because they are so expensive and he would never be able to afford them if he used them as directed.  I explained that in his case, he needs to use these inhalers as directed for control and for increased SOB. I told him we have a pharmacy dept and they could possibly assist him with medication costs and he agreed to a referral for this. I advised he would be asked about his income for this assessment.  I encouraged him to avoid the extreme heat, stay in the [facility], do outdoor chores in the early am. If he has noted anything else triggers his SOB he should try to avoid this.  I advised that his assigned nurse care manager will be in touch with him next week.  Eulah Pont. Myrtie Neither, MSN, [facility] Gerontological Nurse Practitioner [facility] Care Management [phone]

## 2017-07-11 ENCOUNTER — Other Ambulatory Visit: Payer: Self-pay

## 2017-07-11 NOTE — Addendum Note (Signed)
Addended by: [name] D on: 07/11/2017 04:44 PM   Modules accepted: Orders

## 2017-07-11 NOTE — Patient Outreach (Addendum)
[facility]) Care Management  71/04/2017  [place]ledge Selsor Ferdig [birth year] [id number]  72 year old male referred to [place] Management.  [place] services requested for medication management and medication assistance with his inhalers.  PMHx includes, but not limited to, hypertension, heart failure, asthma, GERD, gout, Type 2 diabetes mellitus and anxiety.   Successful  outreach attempt to Mr. Glidden.  HIPAA identifiers verified.   Incoming call received from Mr. And Mrs. Alden to further discuss medications.  HIPAA identifiers verified.   Subjective: Mr. Boschert reports that he is doing better, but that he had recently had a rough time when he had to go to the ED on [facility] Day for bronchitis.  He reports that he is non compliant with his Symbicort because the copay is $45 and he tries to make it last as long as possible.  He reports that he was using Symbicort as needed, but since his ED visit he has been using 1 puff twice daily.  The prescription is for 2 puffs twice daily.  He reports that he checks his CBGs about every 3 days with the last value of 143 mg/dL.    Objective:  Scr 1.83m/dL  ~ 41 ml/min on 71/27/19 HgA1c 6.6% on 71/8/19 down from 7.6% on 71/5/19  Current Medications: Current Outpatient Medications  Medication Sig Dispense Refill  . atorvastatin (LIPITOR) 40 MG tablet TAKE 1 TABLET BY MOUTH ONCE DAILY 90 tablet 0  . Blood Glucose Monitoring Suppl (PRODIGY BLOOD GLUCOSE MONITOR) W/DEVICE KIT 1 Units by Does not apply route daily. Check blood sugars 1X per day-- dx 250.02 1 each 0  . budesonide-formoterol (SYMBICORT) 80-4.5 MCG/ACT inhaler Inhale 2 puffs into the lungs 2 (two) times daily. (Patient taking differently: Inhale 2 puffs into the lungs daily as needed. Takes 1 puff twice daily.) 1 Inhaler 5  . chlorthalidone (HYGROTON) 25 MG tablet Take 1 tablet (25 mg total) by mouth daily. 30 tablet 3  . Cholecalciferol (VITAMIN D PO) Take 5,000  Units by mouth every morning.     . clonazePAM (KLONOPIN) 0.5 MG tablet Take 1 tablet (0.5 mg total) by mouth 2 (two) times daily as needed for anxiety. 20 tablet 1  . dextromethorphan-guaiFENesin (MUCINEX DM) 30-600 MG 12hr tablet Take 1 tablet by mouth 2 (two) times daily. 14 tablet 1  . glimepiride (AMARYL) 4 MG tablet Take 1 tablet (4 mg total) by mouth daily with breakfast. 90 tablet 1  . glucose blood test strip Test 1X per day and prn One Touch Ultra test strips 100 each 2  . metFORMIN (GLUCOPHAGE) 1000 MG tablet Take 1 tablet (1,000 mg total) by mouth 2 (two) times daily with a meal. 180 tablet 1  . omeprazole (PRILOSEC) 40 MG capsule TAKE 1 CAPSULE BY MOUTH ONCE DAILY 90 capsule 1  . ONETOUCH DELICA LANCETS 309OMISC 1 each by Does not apply route daily. Test 1X per day and prn 100 each 2  . OXYGEN Inhale 2 L into the lungs at bedtime. 2lpm with sleep     . Polyethyl Glycol-Propyl Glycol (SYSTANE OP) Apply to eye.    . potassium chloride (KLOR-CON M10) 10 MEQ tablet Take 1 tablet (10 mEq total) 2 (two) times daily by mouth. 180 tablet 3  . telmisartan (MICARDIS) 40 MG tablet Take 1 tablet (40 mg total) by mouth daily. 90 tablet 2  . Albuterol Sulfate (PROAIR RESPICLICK) 1709(90 Base) MCG/ACT AEPB Inhale 2 puffs into the lungs every 6 (six)  hours as needed (for shortness of breath/ wheeze). 1 each 0  . bisoprolol (ZEBETA) 10 MG tablet Take 1 tablet (10 mg total) daily by mouth. (Patient not taking: Reported on 71/04/2017) 90 tablet 3  . furosemide (LASIX) 40 MG tablet Take 2 tablets (80 mg total) every morning by mouth AND 1 tablet (40 mg total) every evening. (Patient not taking: Reported on 71/04/2017) 270 tablet 3   No current facility-administered medications for this visit.     Functional Status: In your present state of health, do you have any difficulty performing the following activities: 71/01/2017 71/25/2018  Hearing? N N  Vision? N N  Difficulty concentrating or making decisions?  N N  Walking or climbing stairs? Y N  Comment back and leg pain and dyspnea -  Dressing or bathing? N N  Doing errands, shopping? N -  Preparing Food and eating ? N -  Using the Toilet? N -  In the past six months, have you accidently leaked urine? N -  Do you have problems with loss of bowel control? N -  Managing your Medications? N -  Comment uses pill box -  Managing your Finances? N -  Housekeeping or managing your Housekeeping? N -  Some recent data might be hidden    Fall/Depression Screening: Fall Risk  71/24/2019 71/23/2019 71/03/2017  Falls in the past year? No No No  Number falls in past yr: - - -  Injury with Fall? - - -  Comment - - -  Follow up - - -   PHQ 2/9 Scores 71/24/2019 06/15/2017 71/23/2019 71/03/2017 71/28/2019 71/06/2017 71/08/2016  PHQ - 2 Score _0 0 0 4 2  PHQ- 9 Score _1 - - 15 10   ASSESSMENT: Date Discharged from Hospital: 71/27/19 Date Medication Reconciliation Performed: 71/04/2017  Medications Discontinued at Discharge:  Augmentin  New Medications at Discharge:  Mucinex DM   Patient has recently discharged from hospital and all medications have been reviewed   Drugs sorted by system:  Neurologic/Psychologic: clonazepam  Cardiovascular: atorvastatin, bisoprolol, chlorthalidone, furosemide, potassium chloride, telmisartan  Pulmonary/Allergy: albuterol MDI, budesonide/formoterol  Gastrointestinal: omeprazole  Endocrine: glimepiride, metformin  Topical: systane eye drops  Vitamins/Minerals: cholecalciferol  Miscellaneous: dextromethorphan/guaifenesin  Medications to avoid in the elderly:  Per the [place] List, glimepiride has a higher risk of severe, prolonged hypoglycemia in older adults.   There is strong recommendation to avoid use in the elderly.  Other issues noted:   Symbicort-uses 1 puff twice daily.  He was taking less frequently until his last ED visit for bronchitis.    Bisoprolol is not in the home.  Is patient supposed  to be on this medication?    Furosemide is taking prn weigh gain.  Patient reports Dr. Warrick Parisian told him to weigh daily and take when he notices his weight is up and to stop taking it when he is back to his usual weight. Patient reports taking 80 mg last night and losing 8 lbs.  Patient states he can tell he needs lasix because it is harder to breath and his feet/ankels swell.  Patient reports he has an appointment with his nephrologist on 6/11.  Metformin- watch closely as renal function continues to decline.  His estimated CrCl is 41 ml/min.    Gabapentin- patient discontinued in May, despite Dr.Ramaswamy's taper regimen through July.    Medication Assistance: Symbicort is a LABA/ICS made by [place] that requires 3% OOP prescription expenditure to apply for patient  assistance.  If pulmonologist would allow a switch to [facility], another LABA/ICS made by [employer], [place] could apply for patient assistance along with Proventil HFA which is also made by [employer].  [employer] does not require any OOP expenditure to apply for their patient assistance program.    Medication Management: Reviewed the difference between his maintenance inhaler (Symbicort) and his rescue inhaler (albuterol) and why he MUST be compliant with his Symbicort.  Discussed his perceived short term savings of not getting a medication refilled verses the expense of an ED visit or hospital stay.  Patient verbalized understanding.   Plan: Route note to Pulmonologist, Dr. Chase Caller to see if he will allow switch to [facility] and Proventil HFA.  If approved, begin patient assistance application through [employer]. Inform him that patient discontinued gabapentin last month.  Route note to PCP, Dr. Warrick Parisian, to see if patient is supposed to take bisoprolol.  Inform him of patient's diuresis last night after lasix.   [name], PharmD Clinical Pharmacist [place] [phone]  Addendum: Via in-basket message, the  bisoprolol was discontinued because of frequent respiratory exacerbations.    [name], PharmD [place] [phone]

## 2017-07-12 ENCOUNTER — Other Ambulatory Visit: Payer: Self-pay | Admitting: *Deleted

## 2017-07-12 NOTE — Patient Outreach (Signed)
Telephone call to pt to schedule initial home visit, spoke with pt, HIPAA verified, pt is in the car and can only have a brief conversation, pt states he is using inhalers as prescribed. 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  RN CM scheduled visit for week of 07/25/17.  PLAN See pt for initial home visit week of 07/25/17  Jacqlyn Larsen [facility], [place] Coordinator [phone]

## 2017-07-13 ENCOUNTER — Telehealth: Payer: Self-pay | Admitting: Internal Medicine

## 2017-07-13 NOTE — Telephone Encounter (Signed)
Attempted to call [facility] with [facility]. I did not receive an answer. I have left a message for Anderson Malta to return our call.

## 2017-07-13 NOTE — Telephone Encounter (Signed)
Anderson Malta, [facility], pharm is returning call. Cb is [phone]

## 2017-07-13 NOTE — Telephone Encounter (Signed)
ATC Jennifer, no answer. Left message for her to call back.

## 2017-07-14 ENCOUNTER — Telehealth: Payer: Self-pay | Admitting: Family Medicine

## 2017-07-14 ENCOUNTER — Other Ambulatory Visit: Payer: Self-pay | Admitting: Family Medicine

## 2017-07-14 NOTE — Telephone Encounter (Signed)
Pharmacist calling to verify patient is no longer taking the losartan along with the telmisartan.  Verified to patient that he is only taking the telmisartan.

## 2017-07-15 NOTE — Telephone Encounter (Signed)
lmtcb x2 for [place] with [facility].

## 2017-07-17 DIAGNOSIS — R0600 Dyspnea, unspecified: Secondary | ICD-10-CM | POA: Diagnosis not present

## 2017-07-17 DIAGNOSIS — R0902 Hypoxemia: Secondary | ICD-10-CM | POA: Diagnosis not present

## 2017-07-18 ENCOUNTER — Ambulatory Visit: Payer: Self-pay

## 2017-07-18 NOTE — Telephone Encounter (Signed)
Ok to make the switch

## 2017-07-18 NOTE — Telephone Encounter (Signed)
Anderson Malta with [facility] returning call, [phone]

## 2017-07-18 NOTE — Telephone Encounter (Signed)
Spoke with [place] from [facility]. She stated that she wanted to see if MR would be willing to switch the patient over to [facility] and Proventil. Patient is currently taking Symbicort 80 and Pro Air and those manufacturers have very strict guidelines for patient assistance, [employer] does not.   MR, please advise if you are ok with switching his medications. Thanks!   Anderson Malta also stated that she is constantly on the phone and it is ok to send her an in-basket message. Her name is [name].

## 2017-07-18 NOTE — Telephone Encounter (Signed)
Will send an in-basket message to Anderson Malta so she is aware of MR's approval.    MR, would like for him to be on Dulera 100 or 200?

## 2017-07-19 ENCOUNTER — Other Ambulatory Visit: Payer: Self-pay | Admitting: Pharmacy Technician

## 2017-07-19 ENCOUNTER — Other Ambulatory Visit: Payer: Self-pay

## 2017-07-19 NOTE — Patient Outreach (Signed)
[facility]) Care Management  07/19/2017  Delfino Friesen Offenberger March 31, 1945 [id number]   Received [employer] patient assistance referral from [facility] for [facility] and Proventil HFA. Prepared patient and provider (Dr. Chase Caller) to be mailed out.  Will follow up with patient in 5-7 business days.  Maud Deed L[place], [place] Management [phone]

## 2017-07-19 NOTE — Patient Outreach (Signed)
[facility]) Care Management  07/19/2017  Dilraj Killgore Dipiero Sep 24, 1945 [id number]  72 year old male referred to [place] Management.  [place] services requested for medication management and medication assistance with his inhalers.  PMHx includes, but not limited to, hypertension, heart failure, asthma, GERD, gout, Type 2 diabetes mellitus and anxiety.   Successful  outreach attempt to Mr. Malmberg.  HIPAA identifiers verified.   Medication Assistance: In-basket message received from [place] at  Dr. Golden Pop office.  MD has approved switch to [facility] and Proventil HFA manufacturered by [employer].  I have spoken with Mr. Schrom and he verbalized that he would like to apply to [facility] patient assistance program.    Plan: Route note to CPhT, Heywood Bene to initiate patient assistance  application process for [facility] and Proventil HFA made by [employer].   [name], PharmD Clinical Pharmacist [place] [phone]

## 2017-07-19 NOTE — Telephone Encounter (Signed)
Dulera 11mg 2 puff bid  Dr. MBrand Males M.D., FBridgepoint National HarborC.P Pulmonary and Critical Care Medicine Staff Physician, CRaymondDirector - Interstitial Lung Disease  Program  Pulmonary FWyn[place]hamat [place] [id number] Pager: [phone] If no answer or between  15:00h - 7:00h: call [phone] Telephone: [phone]

## 2017-07-20 MED ORDER — MOMETASONE FURO-FORMOTEROL FUM 100-5 MCG/ACT IN AERO: 2.0000 | INHALATION_SPRAY | Freq: Two times a day (BID) | RESPIRATORY_TRACT | 6 refills | Status: DC

## 2017-07-20 MED ORDER — ALBUTEROL SULFATE HFA 108 (90 BASE) MCG/ACT IN AERS: 2.0000 | INHALATION_SPRAY | Freq: Four times a day (QID) | RESPIRATORY_TRACT | 2 refills | Status: DC | PRN

## 2017-07-20 NOTE — Telephone Encounter (Signed)
Called [place], unable to reach. Left message to give [place] a call back. Per Cherina's last message we are to send this to Anderson Malta to make her aware of the change. Will switch medications.

## 2017-07-20 NOTE — Telephone Encounter (Addendum)
Message closed in error.

## 2017-07-26 ENCOUNTER — Encounter: Payer: Self-pay | Admitting: *Deleted

## 2017-07-26 ENCOUNTER — Other Ambulatory Visit: Payer: Self-pay | Admitting: *Deleted

## 2017-07-26 NOTE — Patient Outreach (Signed)
[name]) Care Management   07/26/2017  Kenneth Fuller 17-Apr-1945 [id number]  Kenneth Fuller Henk is an 72 y.o. male  Subjective: Initial home visit with pt, HIPAA verified, pt reports he has CHF and weighs daily and feels he is managing well, states AIC recently came down to 6.6 "and not all that concerned with diabetes" although pt states he would like to come off metformin at some point and could work on diet, pt checks CBG daily. Pt reports he thinks dust from mowing the yard precipitated his most recent asthma attack and ED visit 07/04/17.  Pt states he is very active with his church and busy around his house.  Pt states he has had "slight depression at times or down days at times" but much better and states " I'm not taking a pill for that"   Objective:   Vitals:   07/26/17 1206  BP: 130/64  Pulse: 87  Resp: 18  SpO2: 97%  Weight: 283 lb (128.4 kg)  Height: 1.702 m (_0 )  CBG today 174 Fasting ranges 130-180's ROS  Physical Exam  Constitutional: He is oriented to person, place, and time. He appears well-developed and well-nourished.  HENT:  Head: Normocephalic.  Neck: Normal range of motion. Neck supple.  Cardiovascular: Normal rate.  Respiratory: Effort normal and breath sounds normal.  GI: Soft. Bowel sounds are normal.  Musculoskeletal: Normal range of motion.  Dependent edema lower extremities bil  Neurological: He is alert and oriented to person, place, and time.  Skin: Skin is warm and dry.  Psychiatric: He has a normal mood and affect. His behavior is normal. Judgment and thought content normal.    Encounter Medications:   Outpatient Encounter Medications as of 07/26/2017  Medication Sig Note  . albuterol (PROVENTIL HFA;VENTOLIN HFA) 108 (90 Base) MCG/ACT inhaler Inhale 2 puffs into the lungs every 6 (six) hours as needed for wheezing or shortness of breath.   [name] atorvastatin (LIPITOR) 40 MG tablet TAKE 1 TABLET BY MOUTH ONCE DAILY   .  Blood Glucose Monitoring Suppl (PRODIGY BLOOD GLUCOSE MONITOR) W/DEVICE KIT 1 Units by Does not apply route daily. Check blood sugars 1X per day-- dx 250.02   . budesonide-formoterol (SYMBICORT) 80-4.5 MCG/ACT inhaler Inhale 2 puffs into the lungs 2 (two) times daily. (Patient taking differently: Inhale 2 puffs into the lungs daily as needed. Takes 1 puff twice daily.)   . Cholecalciferol (VITAMIN D PO) Take 5,000 Units by mouth every morning.    . clonazePAM (KLONOPIN) 0.5 MG tablet Take 1 tablet (0.5 mg total) by mouth 2 (two) times daily as needed for anxiety.   [name] dextromethorphan-guaiFENesin (MUCINEX DM) 30-600 MG 12hr tablet Take 1 tablet by mouth 2 (two) times daily.   [name] glimepiride (AMARYL) 4 MG tablet Take 1 tablet (4 mg total) by mouth daily with breakfast.   . glucose blood test strip Test 1X per day and prn One Touch Ultra test strips   . metFORMIN (GLUCOPHAGE) 1000 MG tablet Take 1 tablet (1,000 mg total) by mouth 2 (two) times daily with a meal.   . omeprazole (PRILOSEC) 40 MG capsule TAKE 1 CAPSULE BY MOUTH ONCE DAILY 07/04/2017: AWAITING REFILL  . ONETOUCH DELICA LANCETS 40J MISC 1 each by Does not apply route daily. Test 1X per day and prn   . OXYGEN Inhale 2 L into the lungs at bedtime. 2lpm with sleep    . potassium chloride (KLOR-CON M10) 10 MEQ tablet Take 1 tablet (10 mEq  total) 2 (two) times daily by mouth.   . telmisartan (MICARDIS) 40 MG tablet Take 1 tablet (40 mg total) by mouth daily.   . chlorthalidone (HYGROTON) 25 MG tablet TAKE 1 TABLET BY MOUTH ONCE DAILY (Patient not taking: Reported on 07/26/2017)   . furosemide (LASIX) 40 MG tablet Take 2 tablets (80 mg total) every morning by mouth AND 1 tablet (40 mg total) every evening. (Patient not taking: Reported on 07/11/2017)   . mometasone-formoterol (DULERA) 100-5 MCG/ACT AERO Inhale 2 puffs into the lungs 2 (two) times daily. (Patient not taking: Reported on 07/26/2017)   . Polyethyl Glycol-Propyl Glycol (SYSTANE OP) Apply to  eye.    No facility-administered encounter medications on file as of 07/26/2017.     Functional Status:   In your present state of health, do you have any difficulty performing the following activities: 07/26/2017 12/20/2016  Hearing? N N  Vision? N N  Difficulty concentrating or making decisions? N N  Walking or climbing stairs? N Y  Comment - back and leg pain and dyspnea  Dressing or bathing? N N  Doing errands, shopping? N N  Preparing Food and eating ? N N  Using the Toilet? N N  In the past six months, have you accidently leaked urine? N N  Do you have problems with loss of bowel control? N N  Managing your Medications? N N  Comment - uses pill box  Managing your Finances? N N  Housekeeping or managing your Housekeeping? Y N  Some recent data might be hidden    Fall/Depression Screening:    Fall Risk  07/26/2017 07/01/2017 05/31/2017  Falls in the past year? No No No  Number falls in past yr: - - -  Injury with Fall? - - -  Comment - - -  Follow up - - -   PHQ 2/9 Scores 07/26/2017 07/26/2017 07/01/2017 06/15/2017 05/31/2017 05/10/2017 04/07/2017  PHQ - 2 Score _0 0 0  PHQ- 9 Score _1 - -    Assessment:  RN CM observed and reviewed medications with pt, reviewed co-morbidities and preserving kidney function, pt states his main goal is to " maintain my status and not get worse"  RN CM praised pt for weighing daily, checking blood sugar and encouraged pt to continuing doing so.  RN CM reviewed EMMI handouts with emphasis on environmental triggers.  RN CM faxed initial home visit and barrier letter to primary MD.  Pt prefers telephone assessment next month.  THN CM Care Plan Problem One     Most Recent Value  Care Plan Problem One  Knowledge deficit related to asthma  Role Documenting the Problem One  Care Management Coordinator  Care Plan for Problem One  Active  THN Long Term Goal   Pt will have improved outcomes related to asthma with no ED/ hospitalizations within  60 days{"  THN Long Term Goal Start Date  07/26/17  Interventions for Problem One Long Term Goal  RN CM gave EMMI handouts and reviewed with pt, gave 24 hour nurse line magnet, reviewed resources to call and which resource to utilize, (MD, urgent care, ED)  [facility] CM Short Term Goal #1   pt will verbalize asthma action plan within 30 days  THN CM Short Term Goal #1 Start Date  07/26/17  Interventions for Short Term Goal #1  RN CM reviewed asthma action plan and importance of calling MD early on for changes in health  status/ symptoms    THN CM Care Plan Problem Two     Most Recent Value  Care Plan Problem Two  Knowledge deficit related to diabetes  Role Documenting the Problem Two  Care Management Coordinator  Care Plan for Problem Two  Active  THN CM Short Term Goal #1   pt will verbalize plate method and appropriate food choices within 30 days  THN CM Short Term Goal #1 Start Date  07/26/17  Interventions for Short Term Goal #2   RN CM reviewed plate method, being mindful of carbohydrates and how many at each meal, reviewed portion sizes      Plan: call pt next month for telephone assessment  Jacqlyn Larsen [facility], [place] Coordinator [phone]

## 2017-07-27 ENCOUNTER — Other Ambulatory Visit: Payer: PPO

## 2017-07-27 DIAGNOSIS — D509 Iron deficiency anemia, unspecified: Secondary | ICD-10-CM | POA: Diagnosis not present

## 2017-07-27 DIAGNOSIS — E559 Vitamin D deficiency, unspecified: Secondary | ICD-10-CM | POA: Diagnosis not present

## 2017-07-27 DIAGNOSIS — Z79899 Other long term (current) drug therapy: Secondary | ICD-10-CM | POA: Diagnosis not present

## 2017-07-27 DIAGNOSIS — N183 Chronic kidney disease, stage 3 (moderate): Secondary | ICD-10-CM | POA: Diagnosis not present

## 2017-07-27 DIAGNOSIS — Z1159 Encounter for screening for other viral diseases: Secondary | ICD-10-CM | POA: Diagnosis not present

## 2017-07-27 DIAGNOSIS — I10 Essential (primary) hypertension: Secondary | ICD-10-CM | POA: Diagnosis not present

## 2017-07-27 DIAGNOSIS — R809 Proteinuria, unspecified: Secondary | ICD-10-CM | POA: Diagnosis not present

## 2017-08-01 ENCOUNTER — Other Ambulatory Visit: Payer: Self-pay | Admitting: Family Medicine

## 2017-08-04 DIAGNOSIS — L03031 Cellulitis of right toe: Secondary | ICD-10-CM | POA: Diagnosis not present

## 2017-08-04 DIAGNOSIS — M79674 Pain in right toe(s): Secondary | ICD-10-CM | POA: Diagnosis not present

## 2017-08-07 ENCOUNTER — Other Ambulatory Visit: Payer: Self-pay | Admitting: Family Medicine

## 2017-08-08 NOTE — Telephone Encounter (Signed)
Last lipid 11/29/16

## 2017-08-09 DIAGNOSIS — M25562 Pain in left knee: Secondary | ICD-10-CM | POA: Diagnosis not present

## 2017-08-09 DIAGNOSIS — M2392 Unspecified internal derangement of left knee: Secondary | ICD-10-CM | POA: Diagnosis not present

## 2017-08-12 ENCOUNTER — Other Ambulatory Visit: Payer: Self-pay | Admitting: Pharmacy Technician

## 2017-08-12 NOTE — Patient Outreach (Addendum)
[facility]) Care Management  08/12/2017  Kenneth Fuller [birth year] [id number]   Received provider portion of [employer] patient assistance application, prepared completed application to be mailed to [employer] patient assistance.  Will follow up with [employer] to check status of application in 26-71 business days.  Maud Deed [place], [place] Management [phone]

## 2017-08-16 DIAGNOSIS — R0600 Dyspnea, unspecified: Secondary | ICD-10-CM | POA: Diagnosis not present

## 2017-08-16 DIAGNOSIS — N183 Chronic kidney disease, stage 3 (moderate): Secondary | ICD-10-CM | POA: Diagnosis not present

## 2017-08-16 DIAGNOSIS — D638 Anemia in other chronic diseases classified elsewhere: Secondary | ICD-10-CM | POA: Diagnosis not present

## 2017-08-16 DIAGNOSIS — R809 Proteinuria, unspecified: Secondary | ICD-10-CM | POA: Diagnosis not present

## 2017-08-16 DIAGNOSIS — E1121 Type 2 diabetes mellitus with diabetic nephropathy: Secondary | ICD-10-CM | POA: Diagnosis not present

## 2017-08-16 DIAGNOSIS — R0902 Hypoxemia: Secondary | ICD-10-CM | POA: Diagnosis not present

## 2017-08-16 DIAGNOSIS — I10 Essential (primary) hypertension: Secondary | ICD-10-CM | POA: Diagnosis not present

## 2017-08-22 ENCOUNTER — Other Ambulatory Visit: Payer: Self-pay | Admitting: *Deleted

## 2017-08-22 NOTE — Patient Outreach (Signed)
Telephone call to pt for telephonic assessment, no answer to telephone, left voicemail requesting return phone call. Unsuccessful outreach letter mailed to pt home.  PLAN Outreach pt 3-4 business days  Jacqlyn Larsen [facility], [place] Coordinator [phone]

## 2017-08-25 ENCOUNTER — Other Ambulatory Visit: Payer: Self-pay | Admitting: *Deleted

## 2017-08-25 ENCOUNTER — Encounter: Payer: Self-pay | Admitting: *Deleted

## 2017-08-25 NOTE — Patient Outreach (Signed)
Telephone call to pt for telephone assessment, spoke with pt, HIPAA verified, pt states he has been busy with church functions/ activities, states weight 278 pounds and he is taking lasix as needed, CBG ranges 130-140's, pt not concerned with CBG. Pt states no medication changes.  Pt agreeable to telephone assessment next month.  THN CM Care Plan Problem One     Most Recent Value  Care Plan Problem One  Knowledge deficit related to asthma  Role Documenting the Problem One  Care Management Coordinator  Care Plan for Problem One  Active  THN Long Term Goal   Pt will have improved outcomes related to asthma with no ED/ hospitalizations within 60 days{"  THN Long Term Goal Start Date  07/26/17  Interventions for Problem One Long Term Goal  RN CM reviewed medications, no changes per pt, pt is taking lasix prn, pt is staying active  THN CM Short Term Goal #1   pt will verbalize asthma action plan within 30 days  THN CM Short Term Goal #1 Start Date  08/25/17 [goal re-established]  Interventions for Short Term Goal #1  RN CM reviewed action plan, importance of avoiding the outside extreme hot weather    [facility] CM Care Plan Problem Two     Most Recent Value  Care Plan Problem Two  Knowledge deficit related to diabetes  Role Documenting the Problem Two  Care Management Coordinator  Care Plan for Problem Two  Active  THN CM Short Term Goal #1   pt will verbalize plate method and appropriate food choices within 30 days  THN CM Short Term Goal #1 Start Date  07/26/17  [facility] CM Short Term Goal #1 Met Date   08/25/17  Interventions for Short Term Goal #2   RN CM reviewed plate method, nutritious food choices

## 2017-08-28 ENCOUNTER — Other Ambulatory Visit: Payer: Self-pay | Admitting: Family Medicine

## 2017-09-08 ENCOUNTER — Other Ambulatory Visit: Payer: Self-pay | Admitting: Pharmacy Technician

## 2017-09-08 NOTE — Patient Outreach (Signed)
Kenneth Fuller) Care Management  09/08/2017  Kenneth Fuller [place] 08-06-1945 [id number]   Follow up call to Kenneth Fuller patient assistance to check the status of patients application for Kenneth Fuller and Kenneth Fuller. Kenneth Fuller stated that the application had been received and the attestation letter had been mailed out to patient on 07/16.   Successful outreach call to patient, HIPAA identifiers verifed. Kenneth Fuller states he does not remember receiving the attestation letter in the mail from Kenneth Fuller, but he would look around in case it was over looked.  Will follow up with patient in 7-10 days to see if letter has been received.  Kenneth Fuller [place], [place] Management [phone]

## 2017-09-16 DIAGNOSIS — R0902 Hypoxemia: Secondary | ICD-10-CM | POA: Diagnosis not present

## 2017-09-16 DIAGNOSIS — R0600 Dyspnea, unspecified: Secondary | ICD-10-CM | POA: Diagnosis not present

## 2017-09-23 DIAGNOSIS — M1712 Unilateral primary osteoarthritis, left knee: Secondary | ICD-10-CM | POA: Diagnosis not present

## 2017-09-23 DIAGNOSIS — M2392 Unspecified internal derangement of left knee: Secondary | ICD-10-CM | POA: Diagnosis not present

## 2017-09-23 DIAGNOSIS — M25562 Pain in left knee: Secondary | ICD-10-CM | POA: Diagnosis not present

## 2017-09-26 ENCOUNTER — Encounter: Payer: Self-pay | Admitting: *Deleted

## 2017-09-26 ENCOUNTER — Other Ambulatory Visit: Payer: Self-pay | Admitting: *Deleted

## 2017-09-26 NOTE — Patient Outreach (Signed)
Outreach call to pt for telephonic assessment, spoke with pt, HIPAA verified, pt reports he is at work today and continues to be very active and busy, reports will be having MRI this Friday for left knee citing several weeks ago his knee "gave way on me when I was getting in my jeep and hasn't been right since"  Pt reports he continues taking diuretic as ordered, continues weighing daily with weight today 280 pounds, CBG 120-130's range.  [facility] pharmacist continues to assist pt.  RN CM discussed discharge plan and pt not interested in RN health coach due to being extremely busy and pt feels he is knowledgeable with action plan.  RN CM mailed case closure letter to patient's home for RN CM services and faxed primary MD case closure letter for RN CM services, sent in basket to [facility] pharmacist informing of RN CM closure.  THN CM Care Plan Problem One     Most Recent Value  Care Plan Problem One  Knowledge deficit related to asthma  Role Documenting the Problem One  Care Management Coordinator  Care Plan for Problem One  Active  THN Long Term Goal   Pt will have improved outcomes related to asthma with no ED/ hospitalizations within 60 days{"  THN Long Term Goal Start Date  07/26/17  [facility] Long Term Goal Met Date  09/26/17  Interventions for Problem One Long Term Goal  Pt self reports he has all medications and taking as prescribed, using inhalers.  RN CM encouraged pt to remain active and reviewed energy conservation.  THN CM Short Term Goal #1   pt will verbalize asthma action plan within 30 days  THN CM Short Term Goal #1 Start Date  08/25/17 [goal re-established]  THN CM Short Term Goal #1 Met Date  09/26/17  Interventions for Short Term Goal #1  RN CM reviewed action plan for asthma, importance of calling MD early for change in health status, symptoms    THN CM Care Plan Problem Two     Most Recent Value  Care Plan Problem Two  Knowledge deficit related to diabetes  Role Documenting the Problem Two   Care Management Coordinator  Care Plan for Problem Two  Active  THN CM Short Term Goal #1   pt will verbalize plate method and appropriate food choices within 30 days  THN CM Short Term Goal #1 Start Date  07/26/17  [facility] CM Short Term Goal #1 Met Date   08/25/17      PLAN Close case for RN CM today [facility] pharmacist continues to assist  Jacqlyn Larsen [facility] Coordinator [phone]

## 2017-09-28 DIAGNOSIS — E559 Vitamin D deficiency, unspecified: Secondary | ICD-10-CM | POA: Diagnosis not present

## 2017-09-28 DIAGNOSIS — Z79899 Other long term (current) drug therapy: Secondary | ICD-10-CM | POA: Diagnosis not present

## 2017-09-28 DIAGNOSIS — R809 Proteinuria, unspecified: Secondary | ICD-10-CM | POA: Diagnosis not present

## 2017-09-28 DIAGNOSIS — D509 Iron deficiency anemia, unspecified: Secondary | ICD-10-CM | POA: Diagnosis not present

## 2017-09-28 DIAGNOSIS — I10 Essential (primary) hypertension: Secondary | ICD-10-CM | POA: Diagnosis not present

## 2017-09-28 DIAGNOSIS — N183 Chronic kidney disease, stage 3 (moderate): Secondary | ICD-10-CM | POA: Diagnosis not present

## 2017-09-30 ENCOUNTER — Other Ambulatory Visit: Payer: Self-pay

## 2017-09-30 DIAGNOSIS — M25562 Pain in left knee: Secondary | ICD-10-CM | POA: Diagnosis not present

## 2017-09-30 NOTE — Patient Outreach (Addendum)
[place] [facility]) Care Management  09/30/2017  Kenneth Fuller February 08, 1946 [id number]  Successful outreach to [name]. HIPAA identifiers verified.  Medication Assistance: [name] states that he believes the threw away the attestation letter from [employer] believing that it was a denial letter.    Informed him that we will have to start the application process over again.  Requested that he call either CPhT, Etter Sjogren or myself when he receives the application, when he mails it back to [place] and when he gets any mail from [employer].  He verbalized understanding.   Plan: Route note to ChT, Etter Sjogren to reinitiate application process for [employer]).  [name], PharmD Clinical Pharmacist [place] [phone]

## 2017-10-04 DIAGNOSIS — N183 Chronic kidney disease, stage 3 (moderate): Secondary | ICD-10-CM | POA: Diagnosis not present

## 2017-10-04 DIAGNOSIS — I10 Essential (primary) hypertension: Secondary | ICD-10-CM | POA: Diagnosis not present

## 2017-10-04 DIAGNOSIS — R809 Proteinuria, unspecified: Secondary | ICD-10-CM | POA: Diagnosis not present

## 2017-10-04 DIAGNOSIS — E1121 Type 2 diabetes mellitus with diabetic nephropathy: Secondary | ICD-10-CM | POA: Diagnosis not present

## 2017-10-05 ENCOUNTER — Other Ambulatory Visit: Payer: Self-pay | Admitting: Pharmacy Technician

## 2017-10-05 NOTE — Patient Outreach (Signed)
[place] [facility]) Care Management  10/05/2017  Lerone Onder Splinter 1945-05-20 [id number]   Informed per [facility] [name] that patient would like to re-start [employer] patient assistance process due to misplacement of documents from old application. Prepared patient portion to be mailed and prepared provider portion to be interofficed to Dr. Chase Caller.  Will follow up with patient in 5-7 business days to confirm application has been received.  Maud Deed [place], [place] Management [phone]

## 2017-10-17 ENCOUNTER — Other Ambulatory Visit: Payer: Self-pay

## 2017-10-17 DIAGNOSIS — R0600 Dyspnea, unspecified: Secondary | ICD-10-CM | POA: Diagnosis not present

## 2017-10-17 DIAGNOSIS — R0902 Hypoxemia: Secondary | ICD-10-CM | POA: Diagnosis not present

## 2017-10-17 NOTE — Patient Outreach (Signed)
[place] [facility]) Care Management  10/17/2017  Kenneth Fuller Feb 28, 1945 [id number]  Unsuccessful outreach to [name].  Left HIPAA compliant voice message requesting a return call.  Plan: Outreach attempt in 3-4 business days to determine if [name] received his patient assistance application for [employer] [name] and Proventil HFA.)  [name], [place] [phone]

## 2017-10-20 ENCOUNTER — Ambulatory Visit: Payer: Self-pay

## 2017-10-20 ENCOUNTER — Other Ambulatory Visit: Payer: Self-pay

## 2017-10-20 NOTE — Patient Outreach (Signed)
[place] [facility]) Care Management  10/20/2017  Kenneth Fuller [birth year] [id number]  Unsuccessful outreach attempt to [name], HIPAA identifiers verified.    Plan: Outreach attempt in 3-4 business days to determine if [name] received his patient assistance application for [employer] [name] and Proventil HFA.)  [name], [place] [phone]

## 2017-10-25 ENCOUNTER — Ambulatory Visit: Payer: Self-pay

## 2017-10-25 ENCOUNTER — Other Ambulatory Visit: Payer: Self-pay

## 2017-10-25 NOTE — Patient Outreach (Addendum)
[place] [facility]) Care Management  10/25/2017  Kenneth Fuller May 08, 1945 [id number]  Incoming voice message received from Kenneth Fuller.  He states that he and his wife have "looked all over and must have misplaced" the second [employer] patient assistance application that was sent to him for [facility] and Proventil HFA.   Per [facility] Etter Sjogren, she has not received the prescriptions for these medications from the provider either.    Because [employer] requires all paperwork via mail, starting the  application process for the third time will take at least another 1-1.5 months to get approval.  Realistically, he will only receive 1-2 months of patient assistance before the process is restarted again for 2020.  It may be better to wait and start paperwork in mid-December for next year, than to restart for the third time in 2019.  Informed Kenneth Fuller of the options and he states that he would prefer to wait until December to begin the 5615 application.    Kenneth Fuller stated that he has no further medication related questions or concerns at this time.  Informed him that I will close his [place] case.  Requested that he call me in early December to begin the 3794 application process.   Plan: Route note to CPhT, Etter Sjogren.  Send case closure letter to Dr. Warrick Parisian.   [name], PharmD Clinical Pharmacist [place] [phone]

## 2017-10-31 ENCOUNTER — Other Ambulatory Visit: Payer: Self-pay | Admitting: Family Medicine

## 2017-11-01 NOTE — Telephone Encounter (Signed)
Ov 11/11/17

## 2017-11-04 DIAGNOSIS — S83242A Other tear of medial meniscus, current injury, left knee, initial encounter: Secondary | ICD-10-CM | POA: Diagnosis not present

## 2017-11-04 DIAGNOSIS — M7712 Lateral epicondylitis, left elbow: Secondary | ICD-10-CM | POA: Diagnosis not present

## 2017-11-04 DIAGNOSIS — S83282A Other tear of lateral meniscus, current injury, left knee, initial encounter: Secondary | ICD-10-CM | POA: Diagnosis not present

## 2017-11-04 DIAGNOSIS — M25562 Pain in left knee: Secondary | ICD-10-CM | POA: Diagnosis not present

## 2017-11-07 ENCOUNTER — Telehealth: Payer: Self-pay | Admitting: Family Medicine

## 2017-11-07 NOTE — Telephone Encounter (Signed)
appt scheduled Pt notified 

## 2017-11-08 ENCOUNTER — Ambulatory Visit (INDEPENDENT_AMBULATORY_CARE_PROVIDER_SITE_OTHER): Payer: PPO | Admitting: Nurse Practitioner

## 2017-11-08 ENCOUNTER — Encounter: Payer: Self-pay | Admitting: Nurse Practitioner

## 2017-11-08 VITALS — BP 167/95 | HR 88 | Temp 98.1°F | Ht 67.0 in | Wt 283.0 lb

## 2017-11-08 DIAGNOSIS — S91332A Puncture wound without foreign body, left foot, initial encounter: Secondary | ICD-10-CM | POA: Diagnosis not present

## 2017-11-08 DIAGNOSIS — Z23 Encounter for immunization: Secondary | ICD-10-CM

## 2017-11-08 NOTE — Progress Notes (Signed)
   Subjective:    Patient ID: Kenneth Fuller, male    DOB: Apr 16, 1945, 72 y.o.   MRN: [id number]   Chief Complaint: Stepped on nail yesterday with left foot   HPI Patient was doing some weeding eating yesterday and stepped on a booard that had a nail in it and nail went up in his foot. He has not had tetanus shot in many years.   Review of Systems  Constitutional: Negative for activity change and appetite change.  HENT: Negative.   Eyes: Negative for pain.  Respiratory: Negative for shortness of breath.   Cardiovascular: Negative for chest pain, palpitations and leg swelling.  Gastrointestinal: Negative for abdominal pain.  Endocrine: Negative for polydipsia.  Genitourinary: Negative.   Skin: Negative for rash.  Neurological: Negative for dizziness, weakness and headaches.  Hematological: Does not bruise/bleed easily.  Psychiatric/Behavioral: Negative.   All other systems reviewed and are negative.      Objective:   Physical Exam  Constitutional: He is oriented to person, place, and time. He appears well-developed and well-nourished.  HENT:  Head: Normocephalic.  Nose: Nose normal.  Mouth/Throat: Oropharynx is clear and moist.  Eyes: Pupils are equal, round, and reactive to light. EOM are normal.  Neck: Normal range of motion and phonation normal. Neck supple. No JVD present. Carotid bruit is not present. No thyroid mass and no thyromegaly present.  Cardiovascular: Normal rate and regular rhythm.  Pulmonary/Chest: Effort normal and breath sounds normal. No respiratory distress.  Abdominal: Soft. Normal appearance, normal aorta and bowel sounds are normal. There is no tenderness.  Musculoskeletal: Normal range of motion.  Lymphadenopathy:    He has no cervical adenopathy.  Neurological: He is alert and oriented to person, place, and time.  Skin: Skin is warm and dry.  Puncture wound to ball of left foot  Psychiatric: He has a normal mood and affect. His behavior is  normal. Judgment and thought content normal.  Nursing note and vitals reviewed.  BP (!) 167/95   Pulse 88   Temp 98.1 F (36.7 C) (Oral)   Ht _0  (1.702 m)   Wt 283 lb (128.4 kg)   BMI 44.32 kg/m      Assessment & Plan:  Kenneth Fuller in today with chief complaint of Stepped on nail yesterday with left foot   1. Puncture wound of left foot, initial encounter Soak in epsom salt bid Watch for signs of infection Tetanus shot today rto prn  Mary-Margaret Hassell Done, FNP

## 2017-11-08 NOTE — Patient Instructions (Signed)
Tetanus Immune Globulin, Human, TIG injection What is this medicine? TETANUS IMMUNE GLOBULIN, TIG, (TET n uhs i MYOON GLOB yoo lin) is used to prevent or to treat tetanus infection. This medicine may be used for other purposes; ask your health care provider or pharmacist if you have questions. COMMON BRAND NAME(S): BayTet, HyperTET S/D What should I tell my health care provider before I take this medicine? They need to know if you have any of these conditions: -bleeding disorders -low levels of platelets in the blood -an unusual or allergic reaction to human immune globulin, other medicines, foods, dyes, or preservatives -pregnant or trying to get pregnant -breast-feeding How should I use this medicine? This medicine is for injection into a muscle. It is given by a health care professional in a hospital or clinic setting. Talk to your pediatrician regarding the use of this medicine in children. While this drug may be prescribed for selected conditions, precautions do apply. Overdosage: If you think you have taken too much of this medicine contact a poison control center or emergency room at once. NOTE: This medicine is only for you. Do not share this medicine with others. What if I miss a dose? This does not apply. What may interact with this medicine? -live virus vaccines, like measles, mumps, or rubella This list may not describe all possible interactions. Give your health care provider a list of all the medicines, herbs, non-prescription drugs, or dietary supplements you use. Also tell them if you smoke, drink alcohol, or use illegal drugs. Some items may interact with your medicine. What should I watch for while using this medicine? Your condition will be monitored carefully while you are receiving this medicine. This medicine is made from human blood. It may be possible to pass an infection in this medicine. Talk to your doctor about the risks and benefits of this medicine. This  medicine may interfere with live virus vaccines. Before you get other live virus vaccines tell your health care professional if you have received this medicine within the past 3 months. What side effects may I notice from receiving this medicine? Side effects that you should report to your doctor or health care professional as soon as possible: -allergic reactions like skin rash, itching or hives, swelling of the face, lips, or tongue -breathing problems -chest pain or tightness -trouble passing urine or change in the amount of urine Side effects that usually do not require medical attention (report to your doctor or health care professional if they continue or are bothersome): -fever -pain and tenderness at site where injected This list may not describe all possible side effects. Call your doctor for medical advice about side effects. You may report side effects to FDA at 1-800-FDA-1088. Where should I keep my medicine? This drug is given in a hospital or clinic and will not be stored at home. NOTE: This sheet is a summary. It may not cover all possible information. If you have questions about this medicine, talk to your doctor, pharmacist, or health care provider.  2018 Elsevier/Gold Standard (2007-10-12 11:27:35)

## 2017-11-10 DIAGNOSIS — G4733 Obstructive sleep apnea (adult) (pediatric): Secondary | ICD-10-CM | POA: Diagnosis not present

## 2017-11-11 ENCOUNTER — Encounter: Payer: Self-pay | Admitting: Family Medicine

## 2017-11-11 ENCOUNTER — Ambulatory Visit (INDEPENDENT_AMBULATORY_CARE_PROVIDER_SITE_OTHER): Payer: PPO | Admitting: Family Medicine

## 2017-11-11 VITALS — BP 148/82 | HR 95 | Temp 97.6°F | Ht 67.0 in | Wt 289.4 lb

## 2017-11-11 DIAGNOSIS — F411 Generalized anxiety disorder: Secondary | ICD-10-CM | POA: Diagnosis not present

## 2017-11-11 DIAGNOSIS — E1142 Type 2 diabetes mellitus with diabetic polyneuropathy: Secondary | ICD-10-CM

## 2017-11-11 DIAGNOSIS — F339 Major depressive disorder, recurrent, unspecified: Secondary | ICD-10-CM | POA: Diagnosis not present

## 2017-11-11 LAB — BAYER DCA HB A1C WAIVED: HB A1C: 6 % (ref <7.0)

## 2017-11-11 MED ORDER — CLONAZEPAM 0.5 MG PO TABS: 0.5000 mg | ORAL_TABLET | Freq: Two times a day (BID) | ORAL | 1 refills | Status: DC | PRN

## 2017-11-11 MED ORDER — SERTRALINE HCL 50 MG PO TABS: 50.0000 mg | ORAL_TABLET | Freq: Every day | ORAL | 1 refills | Status: DC

## 2017-11-11 NOTE — Progress Notes (Signed)
BP (!) 148/82   Pulse 95   Temp 97.6 F (36.4 C) (Oral)   Ht 5' 7" (1.702 m)   Wt 289 lb 6.4 oz (131.3 kg)   BMI 45.33 kg/m    Subjective:    Patient ID: Kenneth Fuller, male    DOB: [birth year], 72 y.o.   MRN: [id number]  HPI: Kenneth Fuller is a 72 y.o. male presenting on 11/11/2017 for Anxiety (Patient states that this has been going on for 5 months and has gotten worse.); Fatigue; and Depression   HPI Anxiety depression Patient comes in to discuss anxiety depression.  A lot of it stems from his health issues and his wife's health issues and she is been in out of the hospital over the past 5 months with a fall and a fracture and then he had a fall and now he is going to go need an ACL repair possibly.  He just feels down and just feels depressed and just not feeling like himself and is coming in for help for this.  He says he is not sleeping well at night.  He denies any suicidal ideations or thoughts of hurting himself. Depression screen [facility] 2/9 11/11/2017 11/08/2017 07/26/2017 07/26/2017 07/01/2017  Decreased Interest _0 Down, Depressed, Hopeless _1 PHQ - 2 Score _2 Altered sleeping 3 3 0 1 1  Tired, decreased energy _3 Change in appetite _4 Feeling bad or failure about yourself  3 0 _5 Trouble concentrating 3 1 0 1 1  Moving slowly or fidgety/restless 1 0 0 0 0  Suicidal thoughts 0 0 0 0 0  PHQ-9 Score _6 Difficult doing work/chores Somewhat difficult - Not difficult at all Somewhat difficult -  Some recent data might be hidden     Type 2 diabetes mellitus Patient comes in today for recheck of his diabetes. Patient has been currently taking glimepiride and metformin. Patient is currently on an ACE inhibitor/ARB. Patient has not seen an ophthalmologist this year. Patient denies any new issues with their feet.  She has known diabetic neuropathy  Relevant past medical, surgical, family and social history reviewed and  updated as indicated. Interim medical history since our last visit reviewed. Allergies and medications reviewed and updated.  Review of Systems  Constitutional: Negative for chills and fever.  Eyes: Negative for visual disturbance.  Respiratory: Negative for shortness of breath and wheezing.   Cardiovascular: Negative for chest pain and leg swelling.  Musculoskeletal: Negative for back pain and gait problem.  Skin: Negative for rash.  Neurological: Negative for dizziness, weakness and light-headedness.  All other systems reviewed and are negative.   Per HPI unless specifically indicated above   Allergies as of 11/11/2017      Reactions   Amlodipine Besy-benazepril Hcl Swelling, Other (See Comments)   Makes tongue swell (lotrel)   Oxycodone Itching   Phenergan [promethazine Hcl] Other (See Comments)   "I can't remember."   Hydrocodone Itching   Can tolerate in low doses      Medication List        Accurate as of 11/11/17  2:58 PM. Always use your most recent med list.          albuterol 108 (90 Base) MCG/ACT inhaler Commonly known as:  PROVENTIL HFA;VENTOLIN HFA Inhale 2 puffs into the  lungs every 6 (six) hours as needed for wheezing or shortness of breath.   atorvastatin 40 MG tablet Commonly known as:  LIPITOR TAKE 1 TABLET BY MOUTH ONCE DAILY   budesonide-formoterol 80-4.5 MCG/ACT inhaler Commonly known as:  SYMBICORT Inhale 2 puffs into the lungs 2 (two) times daily.   chlorthalidone 25 MG tablet Commonly known as:  HYGROTON TAKE 1 TABLET BY MOUTH ONCE DAILY   clonazePAM 0.5 MG tablet Commonly known as:  KLONOPIN Take 1 tablet (0.5 mg total) by mouth 2 (two) times daily as needed for anxiety.   dextromethorphan-guaiFENesin 30-600 MG 12hr tablet Commonly known as:  MUCINEX DM Take 1 tablet by mouth 2 (two) times daily.   furosemide 40 MG tablet Commonly known as:  LASIX Take 2 tablets (80 mg total) every morning by mouth AND 1 tablet (40 mg total) every  evening.   glimepiride 4 MG tablet Commonly known as:  AMARYL Take 1 tablet (4 mg total) by mouth daily with breakfast.   glucose blood test strip Test 1X per day and prn One Touch Ultra test strips   metFORMIN 1000 MG tablet Commonly known as:  GLUCOPHAGE TAKE 1 TABLET BY MOUTH TWICE DAILY WITH MEALS   mometasone-formoterol 100-5 MCG/ACT Aero Commonly known as:  DULERA Inhale 2 puffs into the lungs 2 (two) times daily.   omeprazole 40 MG capsule Commonly known as:  PRILOSEC TAKE 1 CAPSULE BY MOUTH ONCE DAILY   ONETOUCH DELICA LANCETS 47W Misc 1 each by Does not apply route daily. Test 1X per day and prn   OXYGEN Inhale 2 L into the lungs at bedtime. 2lpm with sleep   potassium chloride 10 MEQ tablet Commonly known as:  K-DUR,KLOR-CON Take 1 tablet (10 mEq total) 2 (two) times daily by mouth.   PRODIGY BLOOD GLUCOSE MONITOR w/Device Kit 1 Units by Does not apply route daily. Check blood sugars 1X per day-- dx 250.02   sertraline 50 MG tablet Commonly known as:  ZOLOFT Take 1 tablet (50 mg total) by mouth daily.   SYSTANE OP Apply to eye.   telmisartan 40 MG tablet Commonly known as:  MICARDIS Take 1 tablet (40 mg total) by mouth daily.   VITAMIN D PO Take 5,000 Units by mouth every morning.          Objective:    BP (!) 148/82   Pulse 95   Temp 97.6 F (36.4 C) (Oral)   Ht 5' 7" (1.702 m)   Wt 289 lb 6.4 oz (131.3 kg)   BMI 45.33 kg/m   Wt Readings from Last 3 Encounters:  11/11/17 289 lb 6.4 oz (131.3 kg)  11/08/17 283 lb (128.4 kg)  07/26/17 283 lb (128.4 kg)    Physical Exam  Constitutional: He is oriented to person, place, and time. He appears well-developed and well-nourished. No distress.  Eyes: Conjunctivae are normal. No scleral icterus.  Neck: Neck supple. No thyromegaly present.  Cardiovascular: Normal rate, regular rhythm, normal heart sounds and intact distal pulses.  No murmur heard. Pulmonary/Chest: Effort normal and breath sounds  normal. No respiratory distress. He has no wheezes.  Musculoskeletal: Normal range of motion. He exhibits no edema.  Lymphadenopathy:    He has no cervical adenopathy.  Neurological: He is alert and oriented to person, place, and time. Coordination normal.  Skin: Skin is warm and dry. No rash noted. He is not diaphoretic.  Psychiatric: His behavior is normal. His mood appears anxious. He exhibits a depressed mood. He expresses  no suicidal ideation. He expresses no suicidal plans.  Nursing note and vitals reviewed.       Assessment & Plan:   Problem List Items Addressed This Visit      Endocrine   Diabetes ([place])   Relevant Orders   CMP14+EGFR   Bayer DCA Hb A1c Waived     Other   Anxiety state - Primary   Relevant Medications   sertraline (ZOLOFT) 50 MG tablet   clonazePAM (KLONOPIN) 0.5 MG tablet   Depression, recurrent (HCC)   Relevant Medications   sertraline (ZOLOFT) 50 MG tablet   clonazePAM (KLONOPIN) 0.5 MG tablet       Follow up plan: Return in about 3 months (around 02/11/2018), or if symptoms worsen or fail to improve, for Depression recheck.  Counseling provided for all of the vaccine components Orders Placed This Encounter  Procedures  . CMP14+EGFR  . Bayer [facility] Hb A1c Waived    Caryl Pina, MD [facility] 11/11/2017, 2:58 PM

## 2017-11-12 LAB — CMP14+EGFR
ALBUMIN: 4.3 g/dL (ref 3.5–4.8)
ALT: 28 IU/L (ref 0–44)
AST: 20 IU/L (ref 0–40)
Albumin/Globulin Ratio: 2 (ref 1.2–2.2)
Alkaline Phosphatase: 67 IU/L (ref 39–117)
BUN / CREAT RATIO: 11 (ref 10–24)
BUN: 15 mg/dL (ref 8–27)
Bilirubin Total: 0.3 mg/dL (ref 0.0–1.2)
CO2: 21 mmol/L (ref 20–29)
CREATININE: 1.36 mg/dL — AB (ref 0.76–1.27)
Calcium: 8.7 mg/dL (ref 8.6–10.2)
Chloride: 99 mmol/L (ref 96–106)
GFR, EST AFRICAN AMERICAN: 60 mL/min/{1.73_m2} (ref >59)
GFR, EST NON AFRICAN AMERICAN: 52 mL/min/{1.73_m2} — AB (ref >59)
GLOBULIN, TOTAL: 2.1 g/dL (ref 1.5–4.5)
GLUCOSE: 167 mg/dL — AB (ref 65–99)
Potassium: 3.8 mmol/L (ref 3.5–5.2)
Sodium: 138 mmol/L (ref 134–144)
Total Protein: 6.4 g/dL (ref 6.0–8.5)

## 2017-11-16 DIAGNOSIS — R0902 Hypoxemia: Secondary | ICD-10-CM | POA: Diagnosis not present

## 2017-11-16 DIAGNOSIS — R0600 Dyspnea, unspecified: Secondary | ICD-10-CM | POA: Diagnosis not present

## 2017-11-29 ENCOUNTER — Other Ambulatory Visit: Payer: Self-pay | Admitting: Family Medicine

## 2017-12-14 ENCOUNTER — Encounter: Payer: Self-pay | Admitting: Family Medicine

## 2017-12-14 ENCOUNTER — Ambulatory Visit (INDEPENDENT_AMBULATORY_CARE_PROVIDER_SITE_OTHER): Payer: PPO | Admitting: Family Medicine

## 2017-12-14 VITALS — BP 141/80 | HR 93 | Temp 97.7°F | Ht 67.0 in | Wt 282.6 lb

## 2017-12-14 DIAGNOSIS — F339 Major depressive disorder, recurrent, unspecified: Secondary | ICD-10-CM

## 2017-12-14 DIAGNOSIS — F411 Generalized anxiety disorder: Secondary | ICD-10-CM | POA: Diagnosis not present

## 2017-12-14 MED ORDER — BUPROPION HCL ER (XL) 150 MG PO TB24: 150.0000 mg | ORAL_TABLET | Freq: Every day | ORAL | 1 refills | Status: DC

## 2017-12-14 MED ORDER — CLONAZEPAM 0.5 MG PO TABS: 0.5000 mg | ORAL_TABLET | Freq: Every evening | ORAL | 1 refills | Status: DC | PRN

## 2017-12-14 NOTE — Progress Notes (Signed)
BP (!) 160/94   Pulse 93   Temp 97.7 F (36.5 C) (Oral)   Ht _0  (1.702 m)   Wt 282 lb 9.6 oz (128.2 kg)   BMI 44.26 kg/m    Subjective:    Patient ID: Kenneth Fuller, male    DOB: 1945-04-19, 72 y.o.   MRN: [id number]  HPI: [name] is a 72 y.o. male presenting on 12/14/2017 for Anxiety (4 week re check - Patient states that the Zoloft is not working and it has caused him to feel lightheaded and sick on his stomach.)   HPI Anxiety depression recheck Patient is coming in for anxiety depression recheck.  He says that the Zoloft has made him feel very jittery and he has had a lot of stomach issues and diarrhea basically the whole time that he is been on it.  He says he is felt lightheaded as well at times.  He denies any suicidal ideations or thoughts of hurting himself.  He says his anxiety is not any better than when he was not on the Zoloft.  He does state that the Klonopin does help him some and he has been taking it mainly at night to help calm down and sleep. Depression screen [facility] 2/9 12/14/2017 11/11/2017 11/08/2017 07/26/2017 07/26/2017  Decreased Interest _1 Down, Depressed, Hopeless _2 PHQ - 2 Score _3 Altered sleeping _4 0 1  Tired, decreased energy _5 Change in appetite _6 Feeling bad or failure about yourself  1 3 0 1 1  Trouble concentrating _7 0 1  Moving slowly or fidgety/restless 1 1 0 0 0  Suicidal thoughts 0 0 0 0 0  PHQ-9 Score _8 Difficult doing work/chores - Somewhat difficult - Not difficult at all Somewhat difficult  Some recent data might be hidden     Relevant past medical, surgical, family and social history reviewed and updated as indicated. Interim medical history since our last visit reviewed. Allergies and medications reviewed and updated.  Review of Systems  Constitutional: Negative for chills and fever.  Eyes: Negative for discharge.  Respiratory: Negative for shortness of  breath and wheezing.   Cardiovascular: Negative for chest pain and leg swelling.  Musculoskeletal: Negative for back pain and gait problem.  Skin: Negative for rash.  Psychiatric/Behavioral: Positive for decreased concentration and dysphoric mood. Negative for self-injury, sleep disturbance and suicidal ideas. The patient is nervous/anxious.   All other systems reviewed and are negative.   Per HPI unless specifically indicated above   Allergies as of 12/14/2017      Reactions   Amlodipine Besy-benazepril Hcl Swelling, Other (See Comments)   Makes tongue swell (lotrel)   Oxycodone Itching   Phenergan [promethazine Hcl] Other (See Comments)   "I can't remember."   Hydrocodone Itching   Can tolerate in low doses      Medication List        Accurate as of 12/14/17  1:21 PM. Always use your most recent med list.          albuterol 108 (90 Base) MCG/ACT inhaler Commonly known as:  PROVENTIL HFA;VENTOLIN HFA Inhale 2 puffs into the lungs every 6 (six) hours as needed for wheezing or shortness of breath.   atorvastatin 40 MG tablet Commonly known as:  LIPITOR TAKE 1 TABLET  BY MOUTH ONCE DAILY   budesonide-formoterol 80-4.5 MCG/ACT inhaler Commonly known as:  SYMBICORT Inhale 2 puffs into the lungs 2 (two) times daily.   buPROPion 150 MG 24 hr tablet Commonly known as:  WELLBUTRIN XL Take 1 tablet (150 mg total) by mouth daily.   chlorthalidone 25 MG tablet Commonly known as:  HYGROTON TAKE 1 TABLET BY MOUTH ONCE DAILY   clonazePAM 0.5 MG tablet Commonly known as:  KLONOPIN Take 1 tablet (0.5 mg total) by mouth at bedtime as needed for anxiety.   dextromethorphan-guaiFENesin 30-600 MG 12hr tablet Commonly known as:  MUCINEX DM Take 1 tablet by mouth 2 (two) times daily.   furosemide 40 MG tablet Commonly known as:  LASIX Take 2 tablets (80 mg total) every morning by mouth AND 1 tablet (40 mg total) every evening.   glimepiride 4 MG tablet Commonly known as:   AMARYL Take 1 tablet (4 mg total) by mouth daily with breakfast.   glucose blood test strip Test 1X per day and prn One Touch Ultra test strips   metFORMIN 1000 MG tablet Commonly known as:  GLUCOPHAGE TAKE 1 TABLET BY MOUTH TWICE DAILY WITH MEALS   mometasone-formoterol 100-5 MCG/ACT Aero Commonly known as:  DULERA Inhale 2 puffs into the lungs 2 (two) times daily.   omeprazole 40 MG capsule Commonly known as:  PRILOSEC TAKE 1 CAPSULE BY MOUTH ONCE DAILY   ONETOUCH DELICA LANCETS 16X Misc 1 each by Does not apply route daily. Test 1X per day and prn   OXYGEN Inhale 2 L into the lungs at bedtime. 2lpm with sleep   potassium chloride 10 MEQ tablet Commonly known as:  K-DUR,KLOR-CON Take 1 tablet (10 mEq total) 2 (two) times daily by mouth.   PRODIGY BLOOD GLUCOSE MONITOR w/Device Kit 1 Units by Does not apply route daily. Check blood sugars 1X per day-- dx 250.02   SYSTANE OP Apply to eye.   telmisartan 40 MG tablet Commonly known as:  MICARDIS Take 1 tablet (40 mg total) by mouth daily.   VITAMIN D PO Take 5,000 Units by mouth every morning.          Objective:    BP (!) 160/94   Pulse 93   Temp 97.7 F (36.5 C) (Oral)   Ht _0  (1.702 m)   Wt 282 lb 9.6 oz (128.2 kg)   BMI 44.26 kg/m   Wt Readings from Last 3 Encounters:  12/14/17 282 lb 9.6 oz (128.2 kg)  11/11/17 289 lb 6.4 oz (131.3 kg)  11/08/17 283 lb (128.4 kg)    Physical Exam  Constitutional: He is oriented to person, place, and time. He appears well-developed and well-nourished. No distress.  Eyes: Conjunctivae are normal. Right eye exhibits no discharge. No scleral icterus.  Neck: Neck supple. No thyromegaly present.  Cardiovascular: Normal rate, regular rhythm, normal heart sounds and intact distal pulses.  No murmur heard. Pulmonary/Chest: Effort normal and breath sounds normal. No respiratory distress. He has no wheezes.  Lymphadenopathy:    He has no cervical adenopathy.   Neurological: He is alert and oriented to person, place, and time. Coordination normal.  Skin: Skin is warm and dry. No rash noted. He is not diaphoretic.  Psychiatric: His behavior is normal. His mood appears anxious. He exhibits a depressed mood. He expresses no suicidal ideation. He expresses no suicidal plans.  Nursing note and vitals reviewed.       Assessment & Plan:   Problem List Items Addressed  This Visit      Other   Anxiety state   Relevant Medications   clonazePAM (KLONOPIN) 0.5 MG tablet   buPROPion (WELLBUTRIN XL) 150 MG 24 hr tablet   Depression, recurrent (HCC) - Primary   Relevant Medications   clonazePAM (KLONOPIN) 0.5 MG tablet   buPROPion (WELLBUTRIN XL) 150 MG 24 hr tablet      Added Wellbutrin to his regiment will try and see if he does better on that, Follow up plan: Return in about 4 weeks (around 01/11/2018), or if symptoms worsen or fail to improve, for Return in 3 to 4 weeks for recheck of anxiety.  Counseling provided for all of the vaccine components No orders of the defined types were placed in this encounter.   Caryl Pina, MD [facility] 12/14/2017, 1:21 PM

## 2017-12-15 ENCOUNTER — Telehealth: Payer: Self-pay

## 2017-12-15 NOTE — Telephone Encounter (Signed)
[facility] - Left Message

## 2017-12-17 ENCOUNTER — Other Ambulatory Visit: Payer: Self-pay | Admitting: Cardiovascular Disease

## 2017-12-17 DIAGNOSIS — R0902 Hypoxemia: Secondary | ICD-10-CM | POA: Diagnosis not present

## 2017-12-17 DIAGNOSIS — R0600 Dyspnea, unspecified: Secondary | ICD-10-CM | POA: Diagnosis not present

## 2018-01-02 ENCOUNTER — Other Ambulatory Visit: Payer: Self-pay | Admitting: Pharmacy Technician

## 2018-01-02 ENCOUNTER — Other Ambulatory Visit: Payer: Self-pay

## 2018-01-02 NOTE — Patient Outreach (Addendum)
[facility]) Care Management  01/02/2018  Kenneth Fuller Oct 15, 1945 [id number]  Incoming call received from Kenneth Fuller.  HIPAA identifiers verified.   Medication Assistance: [name] previously lost two sets of [employer] patient assistance applications this past fall and decided to wait and apply for 2020.  He outreached to me today and requested that [place] send him another pateint assistance application for [facility] and Proventil HFA from [employer].     Plan: Route note to Marvin, Etter Sjogren.  [name], PharmD Clinical Pharmacist [place] [phone]

## 2018-01-02 NOTE — Patient Outreach (Signed)
[facility]) Care Management  01/02/2018  Kenneth Fuller 08/24/1945 [id number]   Informed by [facility] [name] that patient wants to apply for 2020 [employer] patient assistance for [facility] and Proventil. Prepared patient portion of app to be mailed and provider portion to be inter-officed to Dr. Warrick Parisian.  Will follow up with patient in 7-10 business days (due to holiday) to confirm application has been received.  Maud Deed Chana Bode [place] Certified Pharmacy Technician [place] Management Direct Dial:[phone]

## 2018-01-04 ENCOUNTER — Inpatient Hospital Stay (HOSPITAL_COMMUNITY)
Admission: EM | Admit: 2018-01-04 | Discharge: 2018-01-09 | DRG: 291 | Disposition: A | Discharge status: Home / Self Care | Payer: PPO | Attending: Internal Medicine | Admitting: Internal Medicine

## 2018-01-04 ENCOUNTER — Emergency Department (HOSPITAL_COMMUNITY): Payer: PPO

## 2018-01-04 ENCOUNTER — Encounter (HOSPITAL_COMMUNITY): Payer: Self-pay

## 2018-01-04 ENCOUNTER — Other Ambulatory Visit: Payer: Self-pay

## 2018-01-04 DIAGNOSIS — Z7984 Long term (current) use of oral hypoglycemic drugs: Secondary | ICD-10-CM | POA: Diagnosis not present

## 2018-01-04 DIAGNOSIS — I152 Hypertension secondary to endocrine disorders: Secondary | ICD-10-CM | POA: Diagnosis not present

## 2018-01-04 DIAGNOSIS — K219 Gastro-esophageal reflux disease without esophagitis: Secondary | ICD-10-CM | POA: Diagnosis present

## 2018-01-04 DIAGNOSIS — J449 Chronic obstructive pulmonary disease, unspecified: Secondary | ICD-10-CM | POA: Diagnosis present

## 2018-01-04 DIAGNOSIS — J9621 Acute and chronic respiratory failure with hypoxia: Secondary | ICD-10-CM | POA: Diagnosis not present

## 2018-01-04 DIAGNOSIS — I5043 Acute on chronic combined systolic (congestive) and diastolic (congestive) heart failure: Secondary | ICD-10-CM | POA: Diagnosis not present

## 2018-01-04 DIAGNOSIS — J441 Chronic obstructive pulmonary disease with (acute) exacerbation: Secondary | ICD-10-CM

## 2018-01-04 DIAGNOSIS — E1159 Type 2 diabetes mellitus with other circulatory complications: Secondary | ICD-10-CM

## 2018-01-04 DIAGNOSIS — J471 Bronchiectasis with (acute) exacerbation: Secondary | ICD-10-CM | POA: Diagnosis present

## 2018-01-04 DIAGNOSIS — I10 Essential (primary) hypertension: Secondary | ICD-10-CM | POA: Diagnosis present

## 2018-01-04 DIAGNOSIS — Z885 Allergy status to narcotic agent status: Secondary | ICD-10-CM | POA: Diagnosis not present

## 2018-01-04 DIAGNOSIS — Z7951 Long term (current) use of inhaled steroids: Secondary | ICD-10-CM

## 2018-01-04 DIAGNOSIS — Z6841 Body Mass Index (BMI) 40.0 and over, adult: Secondary | ICD-10-CM

## 2018-01-04 DIAGNOSIS — J4489 Other specified chronic obstructive pulmonary disease: Secondary | ICD-10-CM | POA: Diagnosis present

## 2018-01-04 DIAGNOSIS — E785 Hyperlipidemia, unspecified: Secondary | ICD-10-CM | POA: Diagnosis not present

## 2018-01-04 DIAGNOSIS — I509 Heart failure, unspecified: Secondary | ICD-10-CM

## 2018-01-04 DIAGNOSIS — I340 Nonrheumatic mitral (valve) insufficiency: Secondary | ICD-10-CM | POA: Diagnosis not present

## 2018-01-04 DIAGNOSIS — Z888 Allergy status to other drugs, medicaments and biological substances status: Secondary | ICD-10-CM

## 2018-01-04 DIAGNOSIS — E1122 Type 2 diabetes mellitus with diabetic chronic kidney disease: Secondary | ICD-10-CM | POA: Diagnosis not present

## 2018-01-04 DIAGNOSIS — F418 Other specified anxiety disorders: Secondary | ICD-10-CM | POA: Diagnosis not present

## 2018-01-04 DIAGNOSIS — Z79899 Other long term (current) drug therapy: Secondary | ICD-10-CM | POA: Diagnosis not present

## 2018-01-04 DIAGNOSIS — Z9989 Dependence on other enabling machines and devices: Secondary | ICD-10-CM | POA: Diagnosis not present

## 2018-01-04 DIAGNOSIS — I160 Hypertensive urgency: Secondary | ICD-10-CM | POA: Diagnosis not present

## 2018-01-04 DIAGNOSIS — I130 Hypertensive heart and chronic kidney disease with heart failure and stage 1 through stage 4 chronic kidney disease, or unspecified chronic kidney disease: Secondary | ICD-10-CM | POA: Diagnosis not present

## 2018-01-04 DIAGNOSIS — G4733 Obstructive sleep apnea (adult) (pediatric): Secondary | ICD-10-CM

## 2018-01-04 DIAGNOSIS — N183 Chronic kidney disease, stage 3 unspecified: Secondary | ICD-10-CM | POA: Diagnosis present

## 2018-01-04 DIAGNOSIS — I110 Hypertensive heart disease with heart failure: Secondary | ICD-10-CM | POA: Diagnosis not present

## 2018-01-04 DIAGNOSIS — E119 Type 2 diabetes mellitus without complications: Secondary | ICD-10-CM

## 2018-01-04 DIAGNOSIS — E66813 Obesity, class 3: Secondary | ICD-10-CM

## 2018-01-04 DIAGNOSIS — I5033 Acute on chronic diastolic (congestive) heart failure: Secondary | ICD-10-CM | POA: Diagnosis not present

## 2018-01-04 DIAGNOSIS — R0602 Shortness of breath: Secondary | ICD-10-CM | POA: Diagnosis not present

## 2018-01-04 LAB — BASIC METABOLIC PANEL
Anion gap: 7 (ref 5–15)
BUN: 18 mg/dL (ref 8–23)
CHLORIDE: 107 mmol/L (ref 98–111)
CO2: 24 mmol/L (ref 22–32)
Calcium: 8.7 mg/dL — ABNORMAL LOW (ref 8.9–10.3)
Creatinine, Ser: 1.35 mg/dL — ABNORMAL HIGH (ref 0.61–1.24)
GFR, EST NON AFRICAN AMERICAN: 52 mL/min — AB (ref >60)
Glucose, Bld: 116 mg/dL — ABNORMAL HIGH (ref 70–99)
Potassium: 4.1 mmol/L (ref 3.5–5.1)
SODIUM: 138 mmol/L (ref 135–145)

## 2018-01-04 LAB — CBC
HEMATOCRIT: 35.3 % — AB (ref 39.0–52.0)
Hemoglobin: 10.8 g/dL — ABNORMAL LOW (ref 13.0–17.0)
MCH: 26.4 pg (ref 26.0–34.0)
MCHC: 30.6 g/dL (ref 30.0–36.0)
MCV: 86.3 fL (ref 80.0–100.0)
NRBC: 0 % (ref 0.0–0.2)
Platelets: 260 10*3/uL (ref 150–400)
RBC: 4.09 MIL/uL — AB (ref 4.22–5.81)
RDW: 15 % (ref 11.5–15.5)
WBC: 10.1 10*3/uL (ref 4.0–10.5)

## 2018-01-04 LAB — GLUCOSE, CAPILLARY: Glucose-Capillary: 192 mg/dL — ABNORMAL HIGH (ref 70–99)

## 2018-01-04 LAB — BRAIN NATRIURETIC PEPTIDE: B NATRIURETIC PEPTIDE 5: 330 pg/mL — AB (ref 0.0–100.0)

## 2018-01-04 LAB — TROPONIN I

## 2018-01-04 MED ORDER — ALBUTEROL SULFATE (2.5 MG/3ML) 0.083% IN NEBU
2.5000 mg | INHALATION_SOLUTION | RESPIRATORY_TRACT | Status: DC | PRN
  Administered 2018-01-05: 2.5 mg via RESPIRATORY_TRACT
  Filled 2018-01-04 (×2): qty 3

## 2018-01-04 MED ORDER — MOMETASONE FURO-FORMOTEROL FUM 100-5 MCG/ACT IN AERO
2.0000 | INHALATION_SPRAY | Freq: Two times a day (BID) | RESPIRATORY_TRACT | Status: DC
  Administered 2018-01-05: 2 via RESPIRATORY_TRACT
  Filled 2018-01-04: qty 8.8

## 2018-01-04 MED ORDER — ACETAMINOPHEN 325 MG PO TABS
650.0000 mg | ORAL_TABLET | ORAL | Status: DC | PRN
  Administered 2018-01-05 – 2018-01-08 (×8): 650 mg via ORAL
  Filled 2018-01-04 (×8): qty 2

## 2018-01-04 MED ORDER — INSULIN ASPART 100 UNIT/ML ~~LOC~~ SOLN
0.0000 [IU] | Freq: Every day | SUBCUTANEOUS | Status: DC
  Administered 2018-01-05: 3 [IU] via SUBCUTANEOUS
  Administered 2018-01-07: 2 [IU] via SUBCUTANEOUS
  Administered 2018-01-08: 3 [IU] via SUBCUTANEOUS

## 2018-01-04 MED ORDER — HEPARIN SODIUM (PORCINE) 5000 UNIT/ML IJ SOLN
5000.0000 [IU] | Freq: Three times a day (TID) | INTRAMUSCULAR | Status: DC
  Administered 2018-01-05 – 2018-01-09 (×12): 5000 [IU] via SUBCUTANEOUS
  Filled 2018-01-04 (×10): qty 1

## 2018-01-04 MED ORDER — IRBESARTAN 150 MG PO TABS
150.0000 mg | ORAL_TABLET | Freq: Every day | ORAL | Status: DC
  Filled 2018-01-04: qty 1

## 2018-01-04 MED ORDER — FUROSEMIDE 10 MG/ML IJ SOLN
40.0000 mg | Freq: Two times a day (BID) | INTRAMUSCULAR | Status: DC
  Administered 2018-01-05 – 2018-01-07 (×4): 40 mg via INTRAVENOUS
  Filled 2018-01-04 (×4): qty 4

## 2018-01-04 MED ORDER — PANTOPRAZOLE SODIUM 40 MG PO TBEC
40.0000 mg | DELAYED_RELEASE_TABLET | Freq: Every day | ORAL | Status: DC
  Administered 2018-01-05 – 2018-01-09 (×5): 40 mg via ORAL
  Filled 2018-01-04 (×5): qty 1

## 2018-01-04 MED ORDER — ONDANSETRON HCL 4 MG/2ML IJ SOLN: 4.0000 mg | Freq: Four times a day (QID) | INTRAMUSCULAR | Status: DC | PRN

## 2018-01-04 MED ORDER — SODIUM CHLORIDE 0.9% FLUSH
3.0000 mL | Freq: Two times a day (BID) | INTRAVENOUS | Status: DC
  Administered 2018-01-05 – 2018-01-09 (×9): 3 mL via INTRAVENOUS

## 2018-01-04 MED ORDER — METHYLPREDNISOLONE SODIUM SUCC 125 MG IJ SOLR
125.0000 mg | Freq: Once | INTRAMUSCULAR | Status: AC
  Administered 2018-01-04: 125 mg via INTRAVENOUS
  Filled 2018-01-04: qty 2

## 2018-01-04 MED ORDER — SODIUM CHLORIDE 0.9% FLUSH: 3.0000 mL | INTRAVENOUS | Status: DC | PRN

## 2018-01-04 MED ORDER — ATORVASTATIN CALCIUM 40 MG PO TABS
40.0000 mg | ORAL_TABLET | Freq: Every day | ORAL | Status: DC
  Administered 2018-01-05 – 2018-01-08 (×4): 40 mg via ORAL
  Filled 2018-01-04 (×4): qty 1

## 2018-01-04 MED ORDER — FUROSEMIDE 10 MG/ML IJ SOLN
40.0000 mg | Freq: Once | INTRAMUSCULAR | Status: AC
  Administered 2018-01-04: 40 mg via INTRAVENOUS
  Filled 2018-01-04: qty 4

## 2018-01-04 MED ORDER — BUPROPION HCL ER (XL) 150 MG PO TB24: 150.0000 mg | ORAL_TABLET | Freq: Every day | ORAL | Status: DC

## 2018-01-04 MED ORDER — SODIUM CHLORIDE 0.9 % IV SOLN
500.0000 mg | INTRAVENOUS | Status: DC
  Administered 2018-01-04: 500 mg via INTRAVENOUS
  Filled 2018-01-04 (×3): qty 500

## 2018-01-04 MED ORDER — IPRATROPIUM-ALBUTEROL 0.5-2.5 (3) MG/3ML IN SOLN
3.0000 mL | Freq: Once | RESPIRATORY_TRACT | Status: AC
  Administered 2018-01-04: 3 mL via RESPIRATORY_TRACT
  Filled 2018-01-04: qty 3

## 2018-01-04 MED ORDER — PREDNISOLONE 5 MG PO TABS
40.0000 mg | ORAL_TABLET | Freq: Every day | ORAL | Status: DC
  Filled 2018-01-04 (×2): qty 8

## 2018-01-04 MED ORDER — DM-GUAIFENESIN ER 30-600 MG PO TB12
1.0000 | ORAL_TABLET | Freq: Two times a day (BID) | ORAL | Status: DC
  Administered 2018-01-04 – 2018-01-06 (×4): 1 via ORAL
  Filled 2018-01-04 (×4): qty 1

## 2018-01-04 MED ORDER — INSULIN ASPART 100 UNIT/ML ~~LOC~~ SOLN
0.0000 [IU] | Freq: Three times a day (TID) | SUBCUTANEOUS | Status: DC
  Administered 2018-01-05: 3 [IU] via SUBCUTANEOUS
  Administered 2018-01-05: 7 [IU] via SUBCUTANEOUS
  Administered 2018-01-05: 5 [IU] via SUBCUTANEOUS
  Administered 2018-01-06: 2 [IU] via SUBCUTANEOUS
  Administered 2018-01-06: 9 [IU] via SUBCUTANEOUS
  Administered 2018-01-06: 3 [IU] via SUBCUTANEOUS
  Administered 2018-01-07 (×3): 5 [IU] via SUBCUTANEOUS
  Administered 2018-01-08: 2 [IU] via SUBCUTANEOUS
  Administered 2018-01-08: 9 [IU] via SUBCUTANEOUS
  Administered 2018-01-08: 5 [IU] via SUBCUTANEOUS
  Administered 2018-01-09: 1 [IU] via SUBCUTANEOUS

## 2018-01-04 MED ORDER — CHLORTHALIDONE 25 MG PO TABS
25.0000 mg | ORAL_TABLET | Freq: Every day | ORAL | Status: DC
  Administered 2018-01-04: 25 mg via ORAL
  Filled 2018-01-04: qty 1

## 2018-01-04 MED ORDER — SODIUM CHLORIDE 0.9 % IV SOLN: 250.0000 mL | INTRAVENOUS | Status: DC | PRN

## 2018-01-04 MED ORDER — CLONAZEPAM 0.5 MG PO TABS
0.5000 mg | ORAL_TABLET | Freq: Every evening | ORAL | Status: DC | PRN
  Administered 2018-01-05 – 2018-01-08 (×4): 0.5 mg via ORAL
  Filled 2018-01-04 (×4): qty 1

## 2018-01-04 MED ORDER — HYDRALAZINE HCL 20 MG/ML IJ SOLN: 10.0000 mg | INTRAMUSCULAR | Status: DC | PRN

## 2018-01-04 MED ORDER — METHYLPREDNISOLONE SODIUM SUCC 40 MG IJ SOLR
40.0000 mg | Freq: Three times a day (TID) | INTRAMUSCULAR | Status: DC
  Administered 2018-01-05 – 2018-01-07 (×8): 40 mg via INTRAVENOUS
  Filled 2018-01-04 (×8): qty 1

## 2018-01-04 NOTE — ED Triage Notes (Signed)
Pt reports SOB for one hour. Pt states he has been coughing for 2 weeks. Non productive . Chest feels tight

## 2018-01-04 NOTE — ED Provider Notes (Signed)
[facility] EMERGENCY DEPARTMENT Provider Note   CSN: [id number] Arrival date & time: 01/04/18  1805     History   Chief Complaint Chief Complaint  Patient presents with  . Shortness of Breath    HPI Kenneth Fuller is a 72 y.o. male.  Patient is a 72 year old male with past medical history of coronary artery disease, diabetes, congestive heart failure, and COPD.  He presents today with complaints of shortness of breath.  This is been going on for the past several days.  It is worse when he exerts himself and lies flat.  He denies any worsening ankle swelling or weight gain.  He denies any fevers, chills, or productive cough.  The history is provided by the patient.  Shortness of Breath  This is a recurrent problem. The average episode lasts 2 days. The problem occurs continuously.The problem has been rapidly worsening. Associated symptoms include orthopnea. Pertinent negatives include no fever, no cough and no sputum production. It is unknown what precipitated the problem. He has tried beta-agonist inhalers for the symptoms. The treatment provided mild relief.    Past Medical History:  Diagnosis Date  . Allergy   . Asthma   . CAD (coronary artery disease)   . Diabetes mellitus   . Fibromyalgia   . GERD (gastroesophageal reflux disease)   . GI bleeding   . Gout   . Hyperlipidemia   . Hypertension   . Hypogonadism male   . MRSA cellulitis   . Neuropathy   . Obesity   . Shortness of breath dyspnea    with exertion   . Sleep apnea    cpap- 14   . Wheezing    no asthma diagnosis    Patient Active Problem List   Diagnosis Date Noted  . Depression, recurrent ([place]) 11/11/2017  . Chronic diastolic heart failure ([place]) 04/13/2017  . Chronic respiratory failure with hypoxia and hypercapnia ([place]) likely  OHS  02/25/2017  . Swelling of lower extremity 11/30/2016  . Avulsion of left subscapularis 12/28/2015  . C1 cervical fracture ([place]) 12/27/2015  . Morbid obesity due  to excess calories ([place]) 08/21/2015  . Urinary urgency 10/15/2014  . Spinal stenosis, lumbar region, with neurogenic claudication 01/02/2014  . Hyperlipidemia associated with type 2 diabetes mellitus ([place]) 10/25/2013  . COLONIC POLYPS, ADENOMATOUS 02/25/2007  . Diabetes ([place]) 02/25/2007  . Gout 02/25/2007  . Anxiety state 02/25/2007  . Hypertension associated with diabetes ([place]) 02/25/2007  . Cough variant asthma 02/25/2007  . OSA on CPAP 02/25/2007  . FATIGUE, CHRONIC 02/25/2007  . HEADACHE, CHRONIC 02/25/2007  . GERD (gastroesophageal reflux disease) 02/25/2007    Past Surgical History:  Procedure Laterality Date  . BACK SURGERY    . CARDIAC CATHETERIZATION    . COLONOSCOPY    . LEFT HEART CATH AND CORONARY ANGIOGRAPHY N/A 12/02/2016   Procedure: LEFT HEART CATH AND CORONARY ANGIOGRAPHY;  Surgeon: Jettie Booze, MD;  Location: [place] [facility];  Service: Cardiovascular;  Laterality: N/A;  . LUMBAR LAMINECTOMY/DECOMPRESSION MICRODISCECTOMY N/A 01/02/2014   Procedure: CENTRAL DECOMPRESSION LUMBAR LAMINECTOMY L3-L4, L4-L5;  Surgeon: Tobi Bastos, MD;  Location: [facility];  Service: Orthopedics;  Laterality: N/A;  . neck fusion          Home Medications    Prior to Admission medications   Medication Sig Start Date End Date Taking? Authorizing Provider  albuterol (PROVENTIL HFA;VENTOLIN HFA) 108 (90 Base) MCG/ACT inhaler Inhale 2 puffs into the lungs every 6 (six) hours as  needed for wheezing or shortness of breath. 07/20/17   Brand Males, MD  atorvastatin (LIPITOR) 40 MG tablet TAKE 1 TABLET BY MOUTH ONCE DAILY 11/01/17   [name], [name], MD  Blood Glucose Monitoring Suppl (PRODIGY BLOOD GLUCOSE MONITOR) W/DEVICE KIT 1 Units by Does not apply route daily. Check blood sugars 1X per day-- dx 250.02 08/18/12   Chevis Pretty, FNP  budesonide-formoterol (SYMBICORT) 80-4.5 MCG/ACT inhaler Inhale 2 puffs into the lungs 2 (two) times daily. Patient taking  differently: Inhale 2 puffs into the lungs daily as needed. Takes 1 puff twice daily. 04/08/17   Parrett, Fonnie Mu, NP  buPROPion (WELLBUTRIN XL) 150 MG 24 hr tablet Take 1 tablet (150 mg total) by mouth daily. 12/14/17   [name], [name], MD  chlorthalidone (HYGROTON) 25 MG tablet TAKE 1 TABLET BY MOUTH ONCE DAILY 07/14/17   [name], [name], MD  Cholecalciferol (VITAMIN D PO) Take 5,000 Units by mouth every morning.     [provider]  clonazePAM (KLONOPIN) 0.5 MG tablet Take 1 tablet (0.5 mg total) by mouth at bedtime as needed for anxiety. 12/14/17   [name], [name], MD  dextromethorphan-guaiFENesin (MUCINEX DM) 30-600 MG 12hr tablet Take 1 tablet by mouth 2 (two) times daily. 07/04/17   Fredia Sorrow, MD  furosemide (LASIX) 40 MG tablet Take 2 tablets (80 mg total) every morning by mouth AND 1 tablet (40 mg total) every evening. 12/13/16   Croitoru, Mihai, MD  glimepiride (AMARYL) 4 MG tablet Take 1 tablet (4 mg total) by mouth daily with breakfast. 06/15/17   [name], [name], MD  glucose blood test strip Test 1X per day and prn One Touch Ultra test strips 03/30/17   [name], [name], MD  metFORMIN (GLUCOPHAGE) 1000 MG tablet TAKE 1 TABLET BY MOUTH TWICE DAILY WITH MEALS 11/01/17   [name], [name], MD  mometasone-formoterol (DULERA) 100-5 MCG/ACT AERO Inhale 2 puffs into the lungs 2 (two) times daily. 07/20/17   Brand Males, MD  omeprazole (PRILOSEC) 40 MG capsule TAKE 1 CAPSULE BY MOUTH ONCE DAILY 11/30/17   [name], [name], MD  [facility] DELICA LANCETS 20N MISC 1 each by Does not apply route daily. Test 1X per day and prn 03/30/17   [name], [name], MD  OXYGEN Inhale 2 L into the lungs at bedtime. 2lpm with sleep     [provider]  Polyethyl Glycol-Propyl Glycol (SYSTANE OP) Apply to eye.    [provider]  potassium chloride (K-DUR) 10 MEQ tablet TAKE 1 TABLET BY MOUTH TWICE DAILY 12/19/17   Croitoru, Mihai, MD  telmisartan (MICARDIS) 40 MG  tablet Take 1 tablet (40 mg total) by mouth daily. 04/25/17   [name], [name], MD    Family History Family History  Problem Relation Age of Onset  . Colon cancer Mother   . Diabetes Father        siblings  . Heart disease Father        brother  . Heart attack Father   . Kidney disease Sister   . Heart failure Sister   . Heart disease Brother   . Heart attack Son 86  . Drug abuse Sister   . Heart disease Brother   . Deep vein thrombosis Brother   . Colon polyps Neg Hx     Social History Social History   Tobacco Use  . Smoking status: Former Smoker    Last attempt to quit: 02/08/1969    Years since quitting: 48.9  .  Smokeless tobacco: Former Network engineer Use Topics  . Alcohol use: No    Alcohol/week: 0.0 standard drinks  . Drug use: No     Allergies   Amlodipine besy-benazepril hcl; Oxycodone; Phenergan [promethazine hcl]; and Hydrocodone   Review of Systems Review of Systems  Constitutional: Negative for fever.  Respiratory: Positive for shortness of breath. Negative for cough and sputum production.   Cardiovascular: Positive for orthopnea.  All other systems reviewed and are negative.    Physical Exam Updated Vital Signs BP (!) 156/97   Pulse 93   Temp 97.7 F (36.5 C) (Oral)   Resp (!) 21   Wt 124.7 kg   SpO2 100%   BMI 43.07 kg/m   Physical Exam  Constitutional: He is oriented to person, place, and time. He appears well-developed and well-nourished. No distress.  HENT:  Head: Normocephalic and atraumatic.  Mouth/Throat: Oropharynx is clear and moist.  Neck: Normal range of motion. Neck supple.  Cardiovascular: Normal rate and regular rhythm. Exam reveals no friction rub.  No murmur heard. Pulmonary/Chest: Effort normal. No respiratory distress. He has no wheezes. He has rales in the right lower field and the left lower field.  Abdominal: Soft. Bowel sounds are normal. He exhibits no distension. There is no tenderness.  Musculoskeletal:  Normal range of motion.       Right lower leg: He exhibits edema.       Left lower leg: He exhibits edema.  There is trace edema of both lower extremities.  Neurological: He is alert and oriented to person, place, and time. Coordination normal.  Skin: Skin is warm and dry. He is not diaphoretic.  Nursing note and vitals reviewed.    ED Treatments / Results  Labs (all labs ordered are listed, but only abnormal results are displayed) Labs Reviewed  BASIC METABOLIC PANEL - Abnormal; Notable for the following components:      Result Value   Glucose, Bld 116 (*)    Creatinine, Ser 1.35 (*)    Calcium 8.7 (*)    GFR calc non Af Amer 52 (*)    All other components within normal limits  CBC - Abnormal; Notable for the following components:   RBC 4.09 (*)    Hemoglobin 10.8 (*)    HCT 35.3 (*)    All other components within normal limits  BRAIN NATRIURETIC PEPTIDE - Abnormal; Notable for the following components:   B Natriuretic Peptide 330.0 (*)    All other components within normal limits  TROPONIN I    EKG None  Radiology Dg Chest 2 View  Result Date: 01/04/2018 CLINICAL DATA:  Shortness of breath EXAM: CHEST - 2 VIEW COMPARISON:  07/04/2017 FINDINGS: Surgical changes in the cervical spine. Trace pleural effusions. Cardiomegaly with vascular congestion. Mild diffuse interstitial opacity suspect for edema. IMPRESSION: Borderline to mild cardiomegaly with small pleural effusion, vascular congestion and mild interstitial edema Electronically Signed   By: Donavan Foil M.D.   On: 01/04/2018 19:24    Procedures Procedures (including critical care time)  Medications Ordered in ED Medications  ipratropium-albuterol (DUONEB) 0.5-2.5 (3) MG/3ML nebulizer solution 3 mL (3 mLs Nebulization Given 01/04/18 1923)  methylPREDNISolone sodium succinate (SOLU-MEDROL) 125 mg/2 mL injection 125 mg (125 mg Intravenous Given 01/04/18 1917)  furosemide (LASIX) injection 40 mg (40 mg Intravenous  Given 01/04/18 1955)     Initial Impression / Assessment and Plan / ED Course  I have reviewed the triage vital signs and the nursing notes.  Pertinent labs & imaging results that were available during my care of the patient were reviewed by me and considered in my medical decision making (see chart for details).  Patient presenting with shortness of breath that based on the work-up is likely related to CHF.  There may also be a component of COPD as well.  Chest x-ray shows mild pulmonary edema, BNP is mildly elevated, but remainder of the work-up is unremarkable.  Patient given Lasix, albuterol, and Solu-Medrol.  He was ambulated to the bathroom but became more dyspneic and oxygen saturations dropped to the 90s.  Patient will be admitted for additional treatment.  CRITICAL CARE Performed by: Veryl Speak Total critical care time: 35 minutes Critical care time was exclusive of separately billable procedures and treating other patients. Critical care was necessary to treat or prevent imminent or life-threatening deterioration. Critical care was time spent personally by me on the following activities: development of treatment plan with patient and/or surrogate as well as nursing, discussions with consultants, evaluation of patient's response to treatment, examination of patient, obtaining history from patient or surrogate, ordering and performing treatments and interventions, ordering and review of laboratory studies, ordering and review of radiographic studies, pulse oximetry and re-evaluation of patient's condition.   Final Clinical Impressions(s) / ED Diagnoses   Final diagnoses:  None    ED Discharge Orders    None       Veryl Speak, MD 01/04/18 2048

## 2018-01-04 NOTE — Progress Notes (Signed)
Patient states he will place self on when ready. RT informed patient if he has any trouble have RN contact RT.

## 2018-01-04 NOTE — ED Notes (Signed)
Roselyn Reef, RRT notified and stated she would be down shortly to complete breathing treatment.

## 2018-01-04 NOTE — H&P (Signed)
History and Physical    Kenneth Fuller TDH:[id number] DOB: 1945-03-12 DOA: 01/04/2018  PCP: [name], [name], MD   Patient coming from: Home   Chief Complaint: SOB   HPI: Kenneth Fuller is a 72 y.o. male with medical history significant for COPD with chronic hypoxic respiratory failure, chronic diastolic CHF, depression with anxiety, hypertension, type 2 diabetes mellitus, and OSA on CPAP, now presenting to the emergency department for evaluation of progressive shortness of breath and cough.  Patient reports that he developed a cough approximately 2 weeks ago, mainly nonproductive, and has also experienced insidious worsening in his chronic dyspnea since that time.  Shortness of breath worsened significantly today, he laid down, but this only seemed to worsen his breathing, and he asked his wife to call EMS for transport to the hospital.  He denies any chest pain, fevers, or chills.  Reports that his chronic mild leg swelling seems to be unchanged.  ED Course: Upon arrival to the ED, patient is found to be afebrile, saturating mid 90s on 2 L/min supplemental oxygen, tachypneic in the 30s, slightly tachycardic, and hypertensive to 160/105.  EKG features sinus tachycardia with rate 104.  Chest x-ray is notable for cardiomegaly with small pleural effusion and mild interstitial edema.  Chemistry panel features a creatinine 1.35, similar to priors.  CBC is notable for a hemoglobin of 10.8, down from 11.4 last May.  Troponin is undetectable and BNP elevated to 330.  Patient was given DuoNeb's, 125 mg IV Solu-Medrol, and 40 mg IV Lasix in the ED.  He has begun to diurese, reports some slight improvement, but remains dyspneic at rest and will be observed for ongoing evaluation and management.  Review of Systems:  All other systems reviewed and apart from HPI, are negative.  Past Medical History:  Diagnosis Date  . Allergy   . Asthma   . CAD (coronary artery disease)   . Diabetes mellitus     . Fibromyalgia   . GERD (gastroesophageal reflux disease)   . GI bleeding   . Gout   . Hyperlipidemia   . Hypertension   . Hypogonadism male   . MRSA cellulitis   . Neuropathy   . Obesity   . Shortness of breath dyspnea    with exertion   . Sleep apnea    cpap- 14   . Wheezing    no asthma diagnosis    Past Surgical History:  Procedure Laterality Date  . BACK SURGERY    . CARDIAC CATHETERIZATION    . COLONOSCOPY    . LEFT HEART CATH AND CORONARY ANGIOGRAPHY N/A 12/02/2016   Procedure: LEFT HEART CATH AND CORONARY ANGIOGRAPHY;  Surgeon: Jettie Booze, MD;  Location: [facility];  Service: Cardiovascular;  Laterality: N/A;  . LUMBAR LAMINECTOMY/DECOMPRESSION MICRODISCECTOMY N/A 01/02/2014   Procedure: CENTRAL DECOMPRESSION LUMBAR LAMINECTOMY L3-L4, L4-L5;  Surgeon: Tobi Bastos, MD;  Location: [facility];  Service: Orthopedics;  Laterality: N/A;  . neck fusion       reports that he quit smoking about 48 years ago. He has quit using smokeless tobacco. He reports that he does not drink alcohol or use drugs.  Allergies  Allergen Reactions  . Amlodipine Besy-Benazepril Hcl Swelling and Other (See Comments)    Makes tongue swell (lotrel)  . Oxycodone Itching  . Phenergan [Promethazine Hcl] Other (See Comments)    "I can't remember."  . Hydrocodone Itching    Can tolerate in low doses    Family  History  Problem Relation Age of Onset  . Colon cancer Mother   . Diabetes Father        siblings  . Heart disease Father        brother  . Heart attack Father   . Kidney disease Sister   . Heart failure Sister   . Heart disease Brother   . Heart attack Son 65  . Drug abuse Sister   . Heart disease Brother   . Deep vein thrombosis Brother   . Colon polyps Neg Hx      Prior to Admission medications   Medication Sig Start Date End Date Taking? Authorizing Provider  albuterol (PROVENTIL HFA;VENTOLIN HFA) 108 (90 Base) MCG/ACT inhaler Inhale 2 puffs into the  lungs every 6 (six) hours as needed for wheezing or shortness of breath. 07/20/17   Brand Males, MD  atorvastatin (LIPITOR) 40 MG tablet TAKE 1 TABLET BY MOUTH ONCE DAILY 11/01/17   [name], [name], MD  Blood Glucose Monitoring Suppl (PRODIGY BLOOD GLUCOSE MONITOR) W/DEVICE KIT 1 Units by Does not apply route daily. Check blood sugars 1X per day-- dx 250.02 08/18/12   Chevis Pretty, FNP  budesonide-formoterol (SYMBICORT) 80-4.5 MCG/ACT inhaler Inhale 2 puffs into the lungs 2 (two) times daily. Patient taking differently: Inhale 2 puffs into the lungs daily as needed. Takes 1 puff twice daily. 04/08/17   Parrett, Fonnie Mu, NP  buPROPion (WELLBUTRIN XL) 150 MG 24 hr tablet Take 1 tablet (150 mg total) by mouth daily. 12/14/17   [name], [name], MD  chlorthalidone (HYGROTON) 25 MG tablet TAKE 1 TABLET BY MOUTH ONCE DAILY 07/14/17   [name], [name], MD  Cholecalciferol (VITAMIN D PO) Take 5,000 Units by mouth every morning.     [provider]  clonazePAM (KLONOPIN) 0.5 MG tablet Take 1 tablet (0.5 mg total) by mouth at bedtime as needed for anxiety. 12/14/17   [name], [name], MD  dextromethorphan-guaiFENesin (MUCINEX DM) 30-600 MG 12hr tablet Take 1 tablet by mouth 2 (two) times daily. 07/04/17   Fredia Sorrow, MD  furosemide (LASIX) 40 MG tablet Take 2 tablets (80 mg total) every morning by mouth AND 1 tablet (40 mg total) every evening. 12/13/16   Croitoru, Mihai, MD  glimepiride (AMARYL) 4 MG tablet Take 1 tablet (4 mg total) by mouth daily with breakfast. 06/15/17   [name], [name], MD  glucose blood test strip Test 1X per day and prn One Touch Ultra test strips 03/30/17   [name], [name], MD  metFORMIN (GLUCOPHAGE) 1000 MG tablet TAKE 1 TABLET BY MOUTH TWICE DAILY WITH MEALS 11/01/17   [name], [name], MD  mometasone-formoterol (DULERA) 100-5 MCG/ACT AERO Inhale 2 puffs into the lungs 2 (two) times daily. 07/20/17   Brand Males, MD  omeprazole (PRILOSEC)  40 MG capsule TAKE 1 CAPSULE BY MOUTH ONCE DAILY 11/30/17   [name], [name], MD  [facility] DELICA LANCETS 41S MISC 1 each by Does not apply route daily. Test 1X per day and prn 03/30/17   [name], [name], MD  OXYGEN Inhale 2 L into the lungs at bedtime. 2lpm with sleep     [provider]  Polyethyl Glycol-Propyl Glycol (SYSTANE OP) Apply to eye.    [provider]  potassium chloride (K-DUR) 10 MEQ tablet TAKE 1 TABLET BY MOUTH TWICE DAILY 12/19/17   Croitoru, Mihai, MD  telmisartan (MICARDIS) 40 MG tablet Take 1 tablet (40 mg total) by mouth daily. 04/25/17   [name], [name],  MD    Physical Exam: Vitals:   01/04/18 1851 01/04/18 1900 01/04/18 1923 01/04/18 1930  BP: (!) 153/98 (!) 156/97  (!) 164/100  Pulse: 97 93  94  Resp: 20 (!) 21  18  Temp:      TempSrc:      SpO2: 98% 99% 100% 99%  Weight:        Constitutional: Tachypneic at rest, dyspneic with speech, no acute distress, no pallor  Eyes: PERTLA, lids and conjunctivae normal ENMT: Mucous membranes are moist. Posterior pharynx clear of any exudate or lesions.   Neck: normal, supple, no masses, no thyromegaly Respiratory: Breath sounds diminished bilaterally with prolonged expiratory phase. Rales at bilateral bases. No accessory muscle use.  Cardiovascular: S1 & S2 heard, regular rate and rhythm. 1+ pretibial pitting edema bilaterally. Abdomen: No distension, no tenderness, soft. Bowel sounds active.  Musculoskeletal: no clubbing / cyanosis. No joint deformity upper and lower extremities.    Skin: no significant rashes, lesions, ulcers. Warm, dry, well-perfused. Neurologic: No facial asymmetry. Sensation intact. Moving all extremities.  Psychiatric: Alert and oriented x 3. Pleasant, cooperative.    Labs on Admission: I have personally reviewed following labs and imaging studies  CBC: Recent Labs  Lab 01/04/18 1843  WBC 10.1  HGB 10.8*  HCT 35.3*  MCV 86.3  PLT 382   Basic Metabolic  Panel: Recent Labs  Lab 01/04/18 1843  NA 138  K 4.1  CL 107  CO2 24  GLUCOSE 116*  BUN 18  CREATININE 1.35*  CALCIUM 8.7*   GFR: Estimated Creatinine Clearance: 63.5 mL/min (A) (by C-G formula based on SCr of 1.35 mg/dL (H)). Liver Function Tests: No results for input(s): AST, ALT, ALKPHOS, BILITOT, PROT, ALBUMIN in the last 168 hours. No results for input(s): LIPASE, AMYLASE in the last 168 hours. No results for input(s): AMMONIA in the last 168 hours. Coagulation Profile: No results for input(s): INR, PROTIME in the last 168 hours. Cardiac Enzymes: Recent Labs  Lab 01/04/18 1843  TROPONINI <0.03   BNP (last 3 results) Recent Labs    02/25/17 1028  PROBNP 97.0   HbA1C: No results for input(s): HGBA1C in the last 72 hours. CBG: No results for input(s): GLUCAP in the last 168 hours. Lipid Profile: No results for input(s): CHOL, HDL, LDLCALC, TRIG, CHOLHDL, LDLDIRECT in the last 72 hours. Thyroid Function Tests: No results for input(s): TSH, T4TOTAL, FREET4, T3FREE, THYROIDAB in the last 72 hours. Anemia Panel: No results for input(s): VITAMINB12, FOLATE, FERRITIN, TIBC, IRON, RETICCTPCT in the last 72 hours. Urine analysis:    Component Value Date/Time   COLORURINE YELLOW 12/26/2015 2033   APPEARANCEUR CLEAR 12/26/2015 2033   LABSPEC 1.036 (H) 12/26/2015 2033   PHURINE 5.0 12/26/2015 2033   GLUCOSEU >1000 (A) 12/26/2015 2033   HGBUR NEGATIVE 12/26/2015 2033   BILIRUBINUR NEGATIVE 12/26/2015 2033   BILIRUBINUR neg 02/13/2013 1428   KETONESUR NEGATIVE 12/26/2015 2033   PROTEINUR NEGATIVE 12/26/2015 2033   UROBILINOGEN 0.2 12/26/2013 1135   NITRITE NEGATIVE 12/26/2015 2033   LEUKOCYTESUR NEGATIVE 12/26/2015 2033   Sepsis Labs: _0 (procalcitonin:4,lacticidven:4) )No results found for this or any previous visit (from the past 240 hour(s)).   Radiological Exams on Admission: Dg Chest 2 View  Result Date: 01/04/2018 CLINICAL DATA:  Shortness of  breath EXAM: CHEST - 2 VIEW COMPARISON:  07/04/2017 FINDINGS: Surgical changes in the cervical spine. Trace pleural effusions. Cardiomegaly with vascular congestion. Mild diffuse interstitial opacity suspect for edema. IMPRESSION: Borderline to mild cardiomegaly  with small pleural effusion, vascular congestion and mild interstitial edema Electronically Signed   By: Donavan Foil M.D.   On: 01/04/2018 19:24    EKG: Independently reviewed. Sinus tachycardia (rate 104).   Assessment/Plan  1. Acute on chronic diastolic CHF  - Presents with 2 wks of increased SOB and non-productive cough  - Wt appears to be stable and pt reports leg swelling to be unchanged, but was experiencing orthopnea, noted to have rales on exam, and found to have edema on CXR and elevated BNP  - Treated in ED with 40 mg IV Lasix and has begun to diurese  - Continue diuresis with Lasix 40 mg IV q12h, continue ARB, follow daily wt and I/O's, update echocardiogram   2. COPD with acute exacerbation  - Presents with 2 wks of increased cough and increased SOB  - No fever or apparent PNA on CXR  - Treated with steroids and nebs in ED  - Check sputum culture, continue systemic steroid and albuterol nebs, start azithromycin, continue ICS/LABA and supplemental O2    3. Hypertension with hypertensive urgency  - SBP in low 100's in ED, anticipate improvement with diuresis  - Continue ARB and chlorthalidone, use hydralazine IVP's as needed   4. Type II DM  - A1c was 6.0% in October  - Managed at home with glimepiride and metformin, held on admission  - Check CBG's and use a SSI with Novolog while in hospital    5. CKD stage III  - SCr is 1.35 on admission, consistent with his apparent baseline  - Renally-dose medications, follow daily chem panel during diuresis   6. Depression, anxiety  - Continue Wellbutrin and Klonopin     DVT prophylaxis: sq heparin  Code Status: Full  Family Communication: Wife updated at  bedside Consults called: none Admission status: Observation     Vianne Bulls, MD [facility] Pager [phone]  If 7PM-7AM, please contact night-coverage [email or web address] Password [email or web address]  01/04/2018, 9:02 PM

## 2018-01-04 NOTE — ED Notes (Signed)
Pt ambulated to the bathroom and back to the room on Room Air, once back in the room O2 sats were 90% and pt was very short of breath

## 2018-01-05 ENCOUNTER — Observation Stay (HOSPITAL_BASED_OUTPATIENT_CLINIC_OR_DEPARTMENT_OTHER): Payer: PPO

## 2018-01-05 DIAGNOSIS — Z9989 Dependence on other enabling machines and devices: Secondary | ICD-10-CM

## 2018-01-05 DIAGNOSIS — I340 Nonrheumatic mitral (valve) insufficiency: Secondary | ICD-10-CM

## 2018-01-05 DIAGNOSIS — G4733 Obstructive sleep apnea (adult) (pediatric): Secondary | ICD-10-CM

## 2018-01-05 LAB — BASIC METABOLIC PANEL
ANION GAP: 11 (ref 5–15)
BUN: 23 mg/dL (ref 8–23)
CO2: 24 mmol/L (ref 22–32)
Calcium: 8.8 mg/dL — ABNORMAL LOW (ref 8.9–10.3)
Chloride: 101 mmol/L (ref 98–111)
Creatinine, Ser: 1.5 mg/dL — ABNORMAL HIGH (ref 0.61–1.24)
GFR calc non Af Amer: 46 mL/min — ABNORMAL LOW (ref >60)
GFR, EST AFRICAN AMERICAN: 54 mL/min — AB (ref >60)
Glucose, Bld: 369 mg/dL — ABNORMAL HIGH (ref 70–99)
POTASSIUM: 4.1 mmol/L (ref 3.5–5.1)
SODIUM: 136 mmol/L (ref 135–145)

## 2018-01-05 LAB — GLUCOSE, CAPILLARY
GLUCOSE-CAPILLARY: 206 mg/dL — AB (ref 70–99)
Glucose-Capillary: 281 mg/dL — ABNORMAL HIGH (ref 70–99)
Glucose-Capillary: 310 mg/dL — ABNORMAL HIGH (ref 70–99)
Glucose-Capillary: 251 mg/dL — ABNORMAL HIGH (ref 70–99)

## 2018-01-05 LAB — ECHOCARDIOGRAM COMPLETE
HEIGHTINCHES: 67 in
WEIGHTICAEL: 4419.78 [oz_av]

## 2018-01-05 MED ORDER — INSULIN DETEMIR 100 UNIT/ML ~~LOC~~ SOLN
10.0000 [IU] | Freq: Every day | SUBCUTANEOUS | Status: DC
  Administered 2018-01-05 – 2018-01-08 (×4): 10 [IU] via SUBCUTANEOUS
  Filled 2018-01-05 (×5): qty 0.1

## 2018-01-05 MED ORDER — ALBUTEROL SULFATE (2.5 MG/3ML) 0.083% IN NEBU
2.5000 mg | INHALATION_SOLUTION | Freq: Two times a day (BID) | RESPIRATORY_TRACT | Status: DC
  Administered 2018-01-05 – 2018-01-09 (×8): 2.5 mg via RESPIRATORY_TRACT
  Filled 2018-01-05 (×9): qty 3

## 2018-01-05 MED ORDER — BUDESONIDE 0.5 MG/2ML IN SUSP
0.5000 mg | Freq: Two times a day (BID) | RESPIRATORY_TRACT | Status: DC
  Administered 2018-01-05 – 2018-01-09 (×8): 0.5 mg via RESPIRATORY_TRACT
  Filled 2018-01-05 (×8): qty 2

## 2018-01-05 MED ORDER — DOXYCYCLINE HYCLATE 100 MG PO TABS
100.0000 mg | ORAL_TABLET | Freq: Two times a day (BID) | ORAL | Status: DC
  Administered 2018-01-05 – 2018-01-09 (×9): 100 mg via ORAL
  Filled 2018-01-05 (×9): qty 1

## 2018-01-05 MED ORDER — ISOSORB DINITRATE-HYDRALAZINE 20-37.5 MG PO TABS
1.0000 | ORAL_TABLET | Freq: Two times a day (BID) | ORAL | Status: DC
  Administered 2018-01-05 – 2018-01-09 (×9): 1 via ORAL
  Filled 2018-01-05 (×13): qty 1

## 2018-01-05 MED ORDER — ARFORMOTEROL TARTRATE 15 MCG/2ML IN NEBU
15.0000 ug | INHALATION_SOLUTION | Freq: Two times a day (BID) | RESPIRATORY_TRACT | Status: DC
  Administered 2018-01-05 – 2018-01-09 (×8): 15 ug via RESPIRATORY_TRACT
  Filled 2018-01-05 (×8): qty 2

## 2018-01-05 NOTE — Care Management (Signed)
Pt from home with strong family support. Pt has insurance and PCP. Pt reports compliance with medications, not with diet. Pt has a scale. Pt drives. Pt has home O2 for nighttime use only through [employer]. Pt on supplemental oxygen continuously now. Pt has had [place] nurse come out before but pta. Pt active with [facility] but does not mention this when asked if followed by anyone. Pt sates, "I don't know why you'll are so worried about my heart when my lungs are the real problem".

## 2018-01-05 NOTE — Progress Notes (Addendum)
PROGRESS NOTE    Kenneth Fuller  ZHY:[id number] DOB: 1945/04/21 DOA: 01/04/2018 PCP: [name], [name], MD     Brief Narrative:  72 y.o. male with medical history significant for COPD with chronic hypoxic respiratory failure, chronic diastolic CHF, depression with anxiety, hypertension, type 2 diabetes mellitus, and OSA on CPAP, now presenting to the emergency department for evaluation of progressive shortness of breath and cough.  Patient reports that he developed a cough approximately 2 weeks ago, mainly nonproductive, and has also experienced insidious worsening in his chronic dyspnea since that time.  Shortness of breath worsened significantly today, he laid down, but this only seemed to worsen his breathing, and he asked his wife to call EMS for transport to the hospital.  He denies any chest pain, fevers, or chills.  Reports that his chronic mild leg swelling seems to be unchanged.  ED Course: Upon arrival to the ED, patient is found to be afebrile, saturating mid 90s on 2 L/min supplemental oxygen, tachypneic in the 30s, slightly tachycardic, and hypertensive to 160/105.  EKG features sinus tachycardia with rate 104.  Chest x-ray is notable for cardiomegaly with small pleural effusion and mild interstitial edema.  Chemistry panel features a creatinine 1.35, similar to priors.  CBC is notable for a hemoglobin of 10.8, down from 11.4 last May.  Troponin is undetectable and BNP elevated to 330.  Patient was given DuoNeb's, 125 mg IV Solu-Medrol, and 40 mg IV Lasix in the ED.  He has begun to diurese, reports some slight improvement, but remains dyspneic at rest. Admitted for further management of CHF and COPD exacerbation.   Assessment & Plan: 1-acute on chronic respiratory failure: In the setting of CHF and COPD exacerbation. -Patient chronically uses about 2 L of oxygen supplementation only at bedtime -Presented with symptoms of orthopnea, elevated BNP, vascular congestion on chest  x-ray and hypoxia. -Currently using 2.5 L of nasal cannula supplementation -Feeling better; good response in urine output to IV Lasix. -Will continue daily weights, strict intake and output, low-sodium diet and IV diuresis -For his COPD treatment will continue steroids, duo neb, start flutter valve, continue Mucinex and initiate treatment with Brovana and Pulmicort.   -Wean oxygen supplementation as tolerated -complete treatment with oral doxycycline for associated bronchiectasis.  -Follow clinical response.   -Patient is still with significant shortness of breath, diffuse wheezing and easily winded with any exertion.  2-Acute on chronic diastolic CHF (congestive heart failure) ([place]) -Continue treatment as mentioned above -Follow results of repeat 2D echo -Continue daily weights and low sodium diet. -Continue strict intake and output. -Close follow-up to patient's electrolytes and renal function.  3-Diabetes mellitus type II, non insulin dependent ([place]): With nephropathy -CBGs elevated in the setting of his steroids use -Will continue the use of a sliding scale insulin and Levemir while inpatient -Anticipate a stabilization of his CBGs once his steroids completely tapered off. -Continue holding oral hypoglycemic agents while inpatient.  4-Hypertension associated with diabetes ([place]) -Overall stable -Will hold ARB's acutely while actively diuresing him for CHF exacerbation -Will start the use of BiDil -Continue beta-blocker.  5-OSA -Continue nightly CPAP  6-Depression with anxiety -Stable mood -No suicidal ideation -continue home antidepressant regimen.  7-hyperlipidemia -Continue statins  8-GERD -Continue PPI.  9-CKD stage 3 -Cr stable and essentially at baseline -will monitor closely and minimize use of nephrotoxic agents while actively diuresing him  10-morbid obesity -Body mass index is 43.26 kg/m. -Low calorie diet, portion control and increase physical activity  has been recommended.   DVT prophylaxis: Heparin Code Status: Full code Family Communication: Daughter at bedside Disposition Plan: Remains inpatient.  Continue IV diuresis, steroids and antibiotics by mouth.  Patient is still with significant shortness of breath and requiring inpatient treatment.  Consultants:   None  Procedures:   2D echo: Pending  See below for x-ray reports  Antimicrobials:  Anti-infectives (From admission, onward)   Start     Dose/Rate Route Frequency Ordered Stop   01/05/18 1545  doxycycline (VIBRA-TABS) tablet 100 mg     100 mg Oral Every 12 hours 01/05/18 1536     01/04/18 2200  azithromycin (ZITHROMAX) 500 mg in sodium chloride 0.9 % 250 mL IVPB  Status:  Discontinued     500 mg 250 mL/hr over 60 Minutes Intravenous Every 24 hours 01/04/18 2157 01/05/18 1536       Subjective: Patient reports an improvement, still short of breath and requiring oxygen supplementation (2.5 L through nasal cannula).  Patient denies chest pain, no nausea, no vomiting, no dysuria.  Reports good urine output.  Objective: Vitals:   01/05/18 0605 01/05/18 0645 01/05/18 1001 01/05/18 1444  BP: (!) 150/94   132/76  Pulse: (!) 102   94  Resp:    18  Temp: 97.6 F (36.4 C)   97.6 F (36.4 C)  TempSrc: Oral   Oral  SpO2: 97%  93% 95%  Weight:  125.3 kg    Height:        Intake/Output Summary (Last 24 hours) at 01/05/2018 1537 Last data filed at 01/05/2018 1300 Gross per 24 hour  Intake 480 ml  Output -  Net 480 ml   Filed Weights   01/04/18 1812 01/04/18 2231 01/05/18 0645  Weight: 124.7 kg 126.7 kg 125.3 kg    Examination: General exam: Alert, awake, oriented x 3; denies chest pain, no nausea, no vomiting.  Patient reports shortness of breath, is requiring around-the-clock oxygen supplementation and express positive orthopnea. Respiratory system: Fine crackles at baseline, positive expiratory wheezing; positive diffuse rhonchi.  No using accessory muscle.    Cardiovascular system:RRR. No murmurs, rubs, gallops; unable to properly assess JVD secondary to body habitus. Gastrointestinal system: Abdomen is obese, nondistended, soft and nontender. No organomegaly or masses felt. Normal bowel sounds heard. Central nervous system: Alert and oriented. No focal neurological deficits. Extremities: No cyanosis or clubbing; trace edema appreciated bilaterally. Skin: No rashes, no open wounds, no lesions or ulcers Psychiatry: Judgement and insight appear normal. Mood & affect appropriate.     Data Reviewed: I have personally reviewed following labs and imaging studies  CBC: Recent Labs  Lab 01/04/18 1843  WBC 10.1  HGB 10.8*  HCT 35.3*  MCV 86.3  PLT 262   Basic Metabolic Panel: Recent Labs  Lab 01/04/18 1843 01/05/18 0434  NA 138 136  K 4.1 4.1  CL 107 101  CO2 24 24  GLUCOSE 116* 369*  BUN 18 23  CREATININE 1.35* 1.50*  CALCIUM 8.7* 8.8*   GFR: Estimated Creatinine Clearance: 57.4 mL/min (A) (by C-G formula based on SCr of 1.5 mg/dL (H)).  Cardiac Enzymes: Recent Labs  Lab 01/04/18 1843  TROPONINI <0.03   BNP (last 3 results) Recent Labs    02/25/17 1028  PROBNP 97.0   HbA1C: No results for input(s): HGBA1C in the last 72 hours.   CBG: Recent Labs  Lab 01/04/18 2341 01/05/18 0724 01/05/18 1107  GLUCAP 192* 281* 206*   Urine analysis:    Component  Value Date/Time   COLORURINE YELLOW 12/26/2015 2033   APPEARANCEUR CLEAR 12/26/2015 2033   LABSPEC 1.036 (H) 12/26/2015 2033   PHURINE 5.0 12/26/2015 2033   GLUCOSEU >1000 (A) 12/26/2015 2033   HGBUR NEGATIVE 12/26/2015 2033   BILIRUBINUR NEGATIVE 12/26/2015 2033   BILIRUBINUR neg 02/13/2013 1428   KETONESUR NEGATIVE 12/26/2015 2033   PROTEINUR NEGATIVE 12/26/2015 2033   UROBILINOGEN 0.2 12/26/2013 1135   NITRITE NEGATIVE 12/26/2015 2033   LEUKOCYTESUR NEGATIVE 12/26/2015 2033   Radiology Studies: Dg Chest 2 View  Result Date: 01/04/2018 CLINICAL DATA:   Shortness of breath EXAM: CHEST - 2 VIEW COMPARISON:  07/04/2017 FINDINGS: Surgical changes in the cervical spine. Trace pleural effusions. Cardiomegaly with vascular congestion. Mild diffuse interstitial opacity suspect for edema. IMPRESSION: Borderline to mild cardiomegaly with small pleural effusion, vascular congestion and mild interstitial edema Electronically Signed   By: Donavan Foil M.D.   On: 01/04/2018 19:24    Scheduled Meds: . albuterol  2.5 mg Nebulization BID  . arformoterol  15 mcg Nebulization BID  . atorvastatin  40 mg Oral q1800  . budesonide (PULMICORT) nebulizer solution  0.5 mg Nebulization BID  . dextromethorphan-guaiFENesin  1 tablet Oral BID  . doxycycline  100 mg Oral Q12H  . furosemide  40 mg Intravenous Q12H  . heparin  5,000 Units Subcutaneous Q8H  . insulin aspart  0-5 Units Subcutaneous QHS  . insulin aspart  0-9 Units Subcutaneous TID WC  . isosorbide-hydrALAZINE  1 tablet Oral BID  . methylPREDNISolone (SOLU-MEDROL) injection  40 mg Intravenous Q8H  . pantoprazole  40 mg Oral Daily  . sodium chloride flush  3 mL Intravenous Q12H   Continuous Infusions: . sodium chloride       LOS: 0 days    Time spent: 30 minutes   Barton Dubois, MD [facility] Pager [phone]  If 7PM-7AM, please contact night-coverage [email or web address] Password [facility] 01/05/2018, 3:37 PM

## 2018-01-05 NOTE — Progress Notes (Signed)
Patient states he will place self on CPAP when ready if he decides to wear it tonight.

## 2018-01-05 NOTE — Care Management Obs Status (Signed)
[facility] NOTIFICATION   Patient Details  Name: DEAKIN LACEK MRN: [id number] Date of Birth: Jul 19, 1945   Medicare Observation Status Notification Given:  Yes    Sherald Barge, RN 01/05/2018, 10:20 AM

## 2018-01-05 NOTE — Progress Notes (Signed)
Echocardiogram 2D Echocardiogram has been performed.  Kenneth Fuller 01/05/2018, 9:21 AM

## 2018-01-06 DIAGNOSIS — K219 Gastro-esophageal reflux disease without esophagitis: Secondary | ICD-10-CM | POA: Diagnosis present

## 2018-01-06 DIAGNOSIS — Z9989 Dependence on other enabling machines and devices: Secondary | ICD-10-CM | POA: Diagnosis not present

## 2018-01-06 DIAGNOSIS — J9621 Acute and chronic respiratory failure with hypoxia: Secondary | ICD-10-CM

## 2018-01-06 DIAGNOSIS — I509 Heart failure, unspecified: Secondary | ICD-10-CM

## 2018-01-06 DIAGNOSIS — I130 Hypertensive heart and chronic kidney disease with heart failure and stage 1 through stage 4 chronic kidney disease, or unspecified chronic kidney disease: Secondary | ICD-10-CM | POA: Diagnosis present

## 2018-01-06 DIAGNOSIS — Z79899 Other long term (current) drug therapy: Secondary | ICD-10-CM | POA: Diagnosis not present

## 2018-01-06 DIAGNOSIS — J471 Bronchiectasis with (acute) exacerbation: Secondary | ICD-10-CM | POA: Diagnosis present

## 2018-01-06 DIAGNOSIS — Z7951 Long term (current) use of inhaled steroids: Secondary | ICD-10-CM | POA: Diagnosis not present

## 2018-01-06 DIAGNOSIS — I160 Hypertensive urgency: Secondary | ICD-10-CM | POA: Diagnosis present

## 2018-01-06 DIAGNOSIS — Z6841 Body Mass Index (BMI) 40.0 and over, adult: Secondary | ICD-10-CM | POA: Diagnosis not present

## 2018-01-06 DIAGNOSIS — F418 Other specified anxiety disorders: Secondary | ICD-10-CM | POA: Diagnosis present

## 2018-01-06 DIAGNOSIS — E1122 Type 2 diabetes mellitus with diabetic chronic kidney disease: Secondary | ICD-10-CM | POA: Diagnosis present

## 2018-01-06 DIAGNOSIS — Z7984 Long term (current) use of oral hypoglycemic drugs: Secondary | ICD-10-CM | POA: Diagnosis not present

## 2018-01-06 DIAGNOSIS — N183 Chronic kidney disease, stage 3 (moderate): Secondary | ICD-10-CM | POA: Diagnosis present

## 2018-01-06 DIAGNOSIS — I152 Hypertension secondary to endocrine disorders: Secondary | ICD-10-CM | POA: Diagnosis present

## 2018-01-06 DIAGNOSIS — Z888 Allergy status to other drugs, medicaments and biological substances status: Secondary | ICD-10-CM | POA: Diagnosis not present

## 2018-01-06 DIAGNOSIS — E119 Type 2 diabetes mellitus without complications: Secondary | ICD-10-CM | POA: Diagnosis not present

## 2018-01-06 DIAGNOSIS — I5043 Acute on chronic combined systolic (congestive) and diastolic (congestive) heart failure: Secondary | ICD-10-CM | POA: Diagnosis not present

## 2018-01-06 DIAGNOSIS — G4733 Obstructive sleep apnea (adult) (pediatric): Secondary | ICD-10-CM | POA: Diagnosis present

## 2018-01-06 DIAGNOSIS — J441 Chronic obstructive pulmonary disease with (acute) exacerbation: Secondary | ICD-10-CM | POA: Diagnosis not present

## 2018-01-06 DIAGNOSIS — I5033 Acute on chronic diastolic (congestive) heart failure: Secondary | ICD-10-CM | POA: Diagnosis present

## 2018-01-06 DIAGNOSIS — Z885 Allergy status to narcotic agent status: Secondary | ICD-10-CM | POA: Diagnosis not present

## 2018-01-06 DIAGNOSIS — E785 Hyperlipidemia, unspecified: Secondary | ICD-10-CM | POA: Diagnosis present

## 2018-01-06 LAB — BASIC METABOLIC PANEL
Anion gap: 10 (ref 5–15)
BUN: 31 mg/dL — ABNORMAL HIGH (ref 8–23)
CO2: 24 mmol/L (ref 22–32)
Calcium: 8.7 mg/dL — ABNORMAL LOW (ref 8.9–10.3)
Chloride: 101 mmol/L (ref 98–111)
Creatinine, Ser: 1.5 mg/dL — ABNORMAL HIGH (ref 0.61–1.24)
GFR calc Af Amer: 54 mL/min — ABNORMAL LOW (ref >60)
GFR calc non Af Amer: 46 mL/min — ABNORMAL LOW (ref >60)
Glucose, Bld: 205 mg/dL — ABNORMAL HIGH (ref 70–99)
Potassium: 3.9 mmol/L (ref 3.5–5.1)
Sodium: 135 mmol/L (ref 135–145)

## 2018-01-06 LAB — GLUCOSE, CAPILLARY
GLUCOSE-CAPILLARY: 199 mg/dL — AB (ref 70–99)
Glucose-Capillary: 214 mg/dL — ABNORMAL HIGH (ref 70–99)
Glucose-Capillary: 327 mg/dL — ABNORMAL HIGH (ref 70–99)
Glucose-Capillary: 166 mg/dL — ABNORMAL HIGH (ref 70–99)

## 2018-01-06 MED ORDER — METHOCARBAMOL 500 MG PO TABS
500.0000 mg | ORAL_TABLET | Freq: Three times a day (TID) | ORAL | Status: DC | PRN
  Administered 2018-01-07 – 2018-01-08 (×5): 500 mg via ORAL
  Filled 2018-01-06 (×5): qty 1

## 2018-01-06 MED ORDER — BENZONATATE 100 MG PO CAPS
200.0000 mg | ORAL_CAPSULE | Freq: Three times a day (TID) | ORAL | Status: DC | PRN
  Administered 2018-01-06 – 2018-01-07 (×2): 200 mg via ORAL
  Filled 2018-01-06 (×2): qty 2

## 2018-01-06 NOTE — Progress Notes (Signed)
PROGRESS NOTE    Kenneth Fuller  NLZ:[id number] DOB: Mar 08, 1945 DOA: 01/04/2018 PCP: [name], [name], MD     Brief Narrative:  72 y.o. male with medical history significant for COPD with chronic hypoxic respiratory failure, chronic diastolic CHF, depression with anxiety, hypertension, type 2 diabetes mellitus, and OSA on CPAP, now presenting to the emergency department for evaluation of progressive shortness of breath and cough.  Patient reports that he developed a cough approximately 2 weeks ago, mainly nonproductive, and has also experienced insidious worsening in his chronic dyspnea since that time.  Shortness of breath worsened significantly today, he laid down, but this only seemed to worsen his breathing, and he asked his wife to call EMS for transport to the hospital.  He denies any chest pain, fevers, or chills.  Reports that his chronic mild leg swelling seems to be unchanged.  ED Course: Upon arrival to the ED, patient is found to be afebrile, saturating mid 90s on 2 L/min supplemental oxygen, tachypneic in the 30s, slightly tachycardic, and hypertensive to 160/105.  EKG features sinus tachycardia with rate 104.  Chest x-ray is notable for cardiomegaly with small pleural effusion and mild interstitial edema.  Chemistry panel features a creatinine 1.35, similar to priors.  CBC is notable for a hemoglobin of 10.8, down from 11.4 last May.  Troponin is undetectable and BNP elevated to 330.  Patient was given DuoNeb's, 125 mg IV Solu-Medrol, and 40 mg IV Lasix in the ED.  He has begun to diurese, reports some slight improvement, but remains dyspneic at rest. Admitted for further management of CHF and COPD exacerbation.   Assessment & Plan: 1-acute on chronic respiratory failure: In the setting of CHF and COPD exacerbation. -Patient chronically uses about 2 L of oxygen supplementation only at bedtime -Presented with symptoms of orthopnea, elevated BNP, vascular congestion on chest  x-ray and hypoxia. -Currently using 2.0 L of nasal cannula supplementation -Feeling better; good response in urine output to IV Lasix. -Will continue daily weights, strict intake and output, low-sodium diet and IV diuresis -For his COPD treatment will continue steroids, duo neb, start flutter valve,  and continue treatment with Brovana and Pulmicort.   -Wean oxygen supplementation as tolerated -complete treatment with oral doxycycline for associated bronchiectasis.  -Follow clinical response.   -Patient is still with significant shortness of breath, diffuse wheezing and easily winded with minimal exertion.  2-Acute on chronic diastolic CHF (congestive heart failure) ([place]) -Continue treatment as mentioned above -Repeat 2D echo demonstrated grade 1 diastolic dysfunction, no wall motion abnormalities and decreasing his systolic function (ejection fraction 45-50%). -Case discussed with Dr. Domenic Polite who has recommended continue acute treatment for heart failure with close outpatient follow-up at discharge.  He is in agreement with the use of BiDil. -Will resume ARB once renal function and fluid overload stabilizes. -Continue daily weights and low sodium diet. -Continue strict intake and output. -Close follow-up to patient's electrolytes and renal function.  3-Diabetes mellitus type II, non insulin dependent ([place]): With nephropathy -CBGs elevated in the setting of his steroids use -Will continue the use of a sliding scale insulin and Levemir while inpatient -Anticipate a stabilization of his CBGs once his steroids completely tapered off. -Continue holding oral hypoglycemic agents while inpatient.  4-Hypertension associated with diabetes ([place]) -Overall stable -Will continue holding ARB's acutely while actively diuresing him for CHF exacerbation -Continue BiDil and continue beta-blocker  5-OSA -Continue nightly CPAP  6-Depression with anxiety -Stable mood -No suicidal ideation -continue  home  antidepressant regimen.  7-hyperlipidemia -Continue statins  8-GERD -Continue PPI.  9-CKD stage 3 -Cr stable and essentially at baseline -will continue to monitor closely and minimize use of nephrotoxic agents while actively diuresing him  10-morbid obesity -Body mass index is 43.96 kg/m. -Low calorie diet, portion control and increase physical activity has been recommended.   DVT prophylaxis: Heparin Code Status: Full code Family Communication: Daughter at bedside Disposition Plan: Remains inpatient.  Continue IV diuresis, steroids and antibiotics by mouth.  Patient is still with significant shortness of breath and requiring inpatient treatment.  Follow clinical response.  Consultants:   None  Procedures:   2D echo: Ejection fraction 45-50%, no wall motion abnormalities, grade 1 diastolic dysfunction.  See below for x-ray reports  Antimicrobials:  Anti-infectives (From admission, onward)   Start     Dose/Rate Route Frequency Ordered Stop   01/05/18 1545  doxycycline (VIBRA-TABS) tablet 100 mg     100 mg Oral Every 12 hours 01/05/18 1536     01/04/18 2200  azithromycin (ZITHROMAX) 500 mg in sodium chloride 0.9 % 250 mL IVPB  Status:  Discontinued     500 mg 250 mL/hr over 60 Minutes Intravenous Every 24 hours 01/04/18 2157 01/05/18 1536      Subjective: Patient reports significant dry coughing spells throughout the night and worsening shortness of breath.  No chest pain and orbital feeling slightly better this morning.  Still easily winded with any activity.  Objective: Vitals:   01/06/18 0748 01/06/18 0756 01/06/18 0802 01/06/18 1342  BP:    120/71  Pulse:    93  Resp:    20  Temp:    97.7 F (36.5 C)  TempSrc:    Oral  SpO2: 98% 100% 100% 95%  Weight:      Height:        Intake/Output Summary (Last 24 hours) at 01/06/2018 1816 Last data filed at 01/06/2018 1300 Gross per 24 hour  Intake 600 ml  Output 800 ml  Net -200 ml   Filed Weights    01/04/18 2231 01/05/18 0645 01/06/18 0737  Weight: 126.7 kg 125.3 kg 127.3 kg    Examination: General exam: Alert, awake, oriented x 3; still short of breath with minimal exertion and requiring oxygen supplementation (2 L).  No chest pain, no nausea, no vomiting. Respiratory system: Poor air movement bilaterally, fine crackles at the bases, mild expiratory wheezing.  No using accessory muscles.  2 L nasal cannula supplementation in place. Cardiovascular system:RRR. No murmurs, rubs, gallops. Gastrointestinal system: Abdomen is obese, nondistended, soft and nontender. No organomegaly or masses felt. Normal bowel sounds heard. Central nervous system: Alert and oriented. No focal neurological deficits. Extremities: No cyanosis or clubbing; trace edema bilaterally on his lower extremities Skin: No rashes, lesions or ulcers Psychiatry: Judgement and insight appear normal. Mood & affect appropriate.    Data Reviewed: I have personally reviewed following labs and imaging studies  CBC: Recent Labs  Lab 01/04/18 1843  WBC 10.1  HGB 10.8*  HCT 35.3*  MCV 86.3  PLT 827   Basic Metabolic Panel: Recent Labs  Lab 01/04/18 1843 01/05/18 0434 01/06/18 0529  NA 138 136 135  K 4.1 4.1 3.9  CL 107 101 101  CO2 _0 GLUCOSE 116* 369* 205*  BUN 18 23 31*  CREATININE 1.35* 1.50* 1.50*  CALCIUM 8.7* 8.8* 8.7*   GFR: Estimated Creatinine Clearance: 57.9 mL/min (A) (by C-G formula based on SCr of 1.5 mg/dL (H)).  Cardiac Enzymes: Recent Labs  Lab 01/04/18 1843  TROPONINI <0.03   BNP (last 3 results) Recent Labs    02/25/17 1028  PROBNP 97.0   CBG: Recent Labs  Lab 01/05/18 1630 01/05/18 2231 01/06/18 0736 01/06/18 1158 01/06/18 1649  GLUCAP 310* 251* 214* 327* 166*   Urine analysis:    Component Value Date/Time   COLORURINE YELLOW 12/26/2015 2033   APPEARANCEUR CLEAR 12/26/2015 2033   LABSPEC 1.036 (H) 12/26/2015 2033   PHURINE 5.0 12/26/2015 2033   GLUCOSEU  >1000 (A) 12/26/2015 2033   HGBUR NEGATIVE 12/26/2015 2033   BILIRUBINUR NEGATIVE 12/26/2015 2033   BILIRUBINUR neg 02/13/2013 1428   KETONESUR NEGATIVE 12/26/2015 2033   PROTEINUR NEGATIVE 12/26/2015 2033   UROBILINOGEN 0.2 12/26/2013 1135   NITRITE NEGATIVE 12/26/2015 2033   LEUKOCYTESUR NEGATIVE 12/26/2015 2033   Radiology Studies: Dg Chest 2 View  Result Date: 01/04/2018 CLINICAL DATA:  Shortness of breath EXAM: CHEST - 2 VIEW COMPARISON:  07/04/2017 FINDINGS: Surgical changes in the cervical spine. Trace pleural effusions. Cardiomegaly with vascular congestion. Mild diffuse interstitial opacity suspect for edema. IMPRESSION: Borderline to mild cardiomegaly with small pleural effusion, vascular congestion and mild interstitial edema Electronically Signed   By: Donavan Foil M.D.   On: 01/04/2018 19:24    Scheduled Meds: . albuterol  2.5 mg Nebulization BID  . arformoterol  15 mcg Nebulization BID  . atorvastatin  40 mg Oral q1800  . budesonide (PULMICORT) nebulizer solution  0.5 mg Nebulization BID  . doxycycline  100 mg Oral Q12H  . furosemide  40 mg Intravenous Q12H  . heparin  5,000 Units Subcutaneous Q8H  . insulin aspart  0-5 Units Subcutaneous QHS  . insulin aspart  0-9 Units Subcutaneous TID WC  . insulin detemir  10 Units Subcutaneous QHS  . isosorbide-hydrALAZINE  1 tablet Oral BID  . methylPREDNISolone (SOLU-MEDROL) injection  40 mg Intravenous Q8H  . pantoprazole  40 mg Oral Daily  . sodium chloride flush  3 mL Intravenous Q12H   Continuous Infusions: . sodium chloride       LOS: 0 days    Time spent: 30 minutes   Barton Dubois, MD [facility] Pager [phone]  If 7PM-7AM, please contact night-coverage [email or web address] Password [facility] 01/06/2018, 6:16 PM

## 2018-01-06 NOTE — Progress Notes (Signed)
CPAP at bedside. Patient said he probably wouldn't use again tonight due to having Lasix and having to get up to use the bathroom often. Will call if he needs assistance with machine.

## 2018-01-07 LAB — GLUCOSE, CAPILLARY
Glucose-Capillary: 277 mg/dL — ABNORMAL HIGH (ref 70–99)
Glucose-Capillary: 281 mg/dL — ABNORMAL HIGH (ref 70–99)
Glucose-Capillary: 260 mg/dL — ABNORMAL HIGH (ref 70–99)
Glucose-Capillary: 209 mg/dL — ABNORMAL HIGH (ref 70–99)

## 2018-01-07 LAB — BASIC METABOLIC PANEL
ANION GAP: 11 (ref 5–15)
BUN: 40 mg/dL — ABNORMAL HIGH (ref 8–23)
CHLORIDE: 101 mmol/L (ref 98–111)
CO2: 24 mmol/L (ref 22–32)
Calcium: 8.5 mg/dL — ABNORMAL LOW (ref 8.9–10.3)
Creatinine, Ser: 1.62 mg/dL — ABNORMAL HIGH (ref 0.61–1.24)
GFR calc non Af Amer: 42 mL/min — ABNORMAL LOW (ref >60)
GFR, EST AFRICAN AMERICAN: 49 mL/min — AB (ref >60)
Glucose, Bld: 300 mg/dL — ABNORMAL HIGH (ref 70–99)
POTASSIUM: 4 mmol/L (ref 3.5–5.1)
SODIUM: 136 mmol/L (ref 135–145)

## 2018-01-07 MED ORDER — METHYLPREDNISOLONE SODIUM SUCC 40 MG IJ SOLR
40.0000 mg | Freq: Two times a day (BID) | INTRAMUSCULAR | Status: DC
  Administered 2018-01-07 – 2018-01-08 (×2): 40 mg via INTRAVENOUS
  Filled 2018-01-07 (×2): qty 1

## 2018-01-07 MED ORDER — FUROSEMIDE 40 MG PO TABS
40.0000 mg | ORAL_TABLET | Freq: Two times a day (BID) | ORAL | Status: DC
  Administered 2018-01-07 – 2018-01-09 (×5): 40 mg via ORAL
  Filled 2018-01-07 (×5): qty 1

## 2018-01-07 NOTE — Progress Notes (Signed)
Patient states he will call if he decides to wear CPAP and has any trouble with it.

## 2018-01-07 NOTE — Progress Notes (Signed)
PROGRESS NOTE    Kenneth Fuller  QIO:[id number] DOB: February 03, 1946 DOA: 01/04/2018 PCP: [name], [name], MD    Brief Narrative:  72 y.o. male with medical history significant for COPD with chronic hypoxic respiratory failure, chronic diastolic CHF, depression with anxiety, hypertension, type 2 diabetes mellitus, and OSA on CPAP, now presenting to the emergency department for evaluation of progressive shortness of breath and cough.  Patient reports that he developed a cough approximately 2 weeks ago, mainly nonproductive, and has also experienced insidious worsening in his chronic dyspnea since that time.  Shortness of breath worsened significantly today, he laid down, but this only seemed to worsen his breathing, and he asked his wife to call EMS for transport to the hospital.  He denies any chest pain, fevers, or chills.  Reports that his chronic mild leg swelling seems to be unchanged.  ED Course: Upon arrival to the ED, patient is found to be afebrile, saturating mid 90s on 2 L/min supplemental oxygen, tachypneic in the 30s, slightly tachycardic, and hypertensive to 160/105.  EKG features sinus tachycardia with rate 104.  Chest x-ray is notable for cardiomegaly with small pleural effusion and mild interstitial edema.  Chemistry panel features a creatinine 1.35, similar to priors.  CBC is notable for a hemoglobin of 10.8, down from 11.4 last May.  Troponin is undetectable and BNP elevated to 330.  Patient was given DuoNeb's, 125 mg IV Solu-Medrol, and 40 mg IV Lasix in the ED.  He has begun to diurese, reports some slight improvement, but remains dyspneic at rest. Admitted for further management of CHF and COPD exacerbation.   Assessment & Plan: 1-acute on chronic respiratory failure: In the setting of CHF and COPD exacerbation. -Patient chronically uses about 2 L of oxygen supplementation only at bedtime -Presented with symptoms of orthopnea, elevated BNP, vascular congestion on chest x-ray  and hypoxia. -Currently using 2.0 L of nasal cannula supplementation around the clock. -Feeling better; good response in urine output to diuretics; will transition to PO. -Will continue daily weights, strict intake and output, low-sodium diet and transition lasix to Po -For his COPD treatment will continue steroids (starting tapering), duo neb, start flutter valve,  and continue treatment with Brovana and Pulmicort.   -Wean oxygen supplementation as tolerated -complete treatment with oral doxycycline for associated bronchiectasis.  -Follow clinical response.   -Patient is still complaining of shortness of breath and having diffuse wheezing and easily winded with exertion.  2-Acute on chronic diastolic CHF (congestive heart failure) ([place]) -Continue treatment as mentioned above -Repeat 2D echo demonstrated grade 1 diastolic dysfunction, no wall motion abnormalities and decreasing his systolic function (ejection fraction 45-50%). -Case discussed with Dr. Domenic Polite who has recommended continue acute treatment for heart failure with close outpatient follow-up at discharge.  He is in agreement with the use of BiDil. -Will resume ARB once renal function and fluid overload stabilizes. -Continue daily weights and low sodium diet. -Continue strict intake and output. -Close follow-up to patient's electrolytes and renal function.  3-Diabetes mellitus type II, non insulin dependent ([place]): With nephropathy -CBGs elevated in the setting of his steroids use -Will continue the use of a sliding scale insulin and Levemir while inpatient -Anticipate a stabilization of his CBGs once his steroids completely tapered off. -Continue holding oral hypoglycemic agents while inpatient.  4-Hypertension associated with diabetes ([place]) -Overall stable and well controlled -Will continue holding ARB's acutely while actively diuresing him for CHF exacerbation -Continue BiDil and continue beta-blocker  5-OSA -Continue  nightly CPAP -patient instructed to be compliant with it  6-Depression with anxiety -Stable mood -No suicidal ideation -continue home antidepressant regimen.  7-hyperlipidemia -Continue statins  8-GERD -Continue PPI.  9-CKD stage 3 -GFR stable, Cr slightly up -will continue to monitor closely and minimize use of nephrotoxic agents while actively diuresing him  10-morbid obesity -Body mass index is 44.02 kg/m. -Low calorie diet, portion control and increase physical activity has been recommended.   DVT prophylaxis: Heparin Code Status: Full code Family Communication: Daughter at bedside Disposition Plan: Remains inpatient. transition diuretics to PO. Continue IV steroids and antibiotics by mouth.  Patient is still with significant shortness of breath and requiring inpatient treatment.  Follow clinical response.  Consultants:   None  Procedures:   2D echo: Ejection fraction 45-50%, no wall motion abnormalities, grade 1 diastolic dysfunction.  See below for x-ray reports  Antimicrobials:  Anti-infectives (From admission, onward)   Start     Dose/Rate Route Frequency Ordered Stop   01/05/18 1545  doxycycline (VIBRA-TABS) tablet 100 mg     100 mg Oral Every 12 hours 01/05/18 1536     01/04/18 2200  azithromycin (ZITHROMAX) 500 mg in sodium chloride 0.9 % 250 mL IVPB  Status:  Discontinued     500 mg 250 mL/hr over 60 Minutes Intravenous Every 24 hours 01/04/18 2157 01/05/18 1536      Subjective: No fever, no CP, no nausea, no vomiting. Feeling better and breathing somewhat easier. Positive coughing spells and SOB with exertion still present.   Objective: Vitals:   01/06/18 2109 01/07/18 0500 01/07/18 0550 01/07/18 0734  BP: (!) 145/82  (!) 150/90   Pulse: 100  100   Resp:   17   Temp: 97.8 F (36.6 C)  97.6 F (36.4 C)   TempSrc: Oral  Oral   SpO2: 98%  96% 99%  Weight:  127.5 kg    Height:        Intake/Output Summary (Last 24 hours) at 01/07/2018  1031 Last data filed at 01/06/2018 2218 Gross per 24 hour  Intake 240 ml  Output 600 ml  Net -360 ml   Filed Weights   01/05/18 0645 01/06/18 0737 01/07/18 0500  Weight: 125.3 kg 127.3 kg 127.5 kg    Examination: General exam: Alert, awake, oriented x 3; feeling better, no CP, no nausea, no vomiting. Reports still having SOB with exertion and is requiring O2 supplementation. No fever and overall feeling better. Still with intermittent coughing spells.  Respiratory system: no crackles, improved air movement, positive exp wheezing and rhonchi  Cardiovascular system:RRR. No murmurs, rubs, gallops. Gastrointestinal system: Abdomen is obese, nondistended, soft and nontender. No organomegaly or masses felt. Normal bowel sounds heard. Central nervous system: Alert and oriented. No focal neurological deficits. Extremities: No Cyanosis, no clubbing, no edema  Skin: No rashes, lesions or ulcers Psychiatry: Judgement and insight appear normal. Mood & affect appropriate.    Data Reviewed: I have personally reviewed following labs and imaging studies  CBC: Recent Labs  Lab 01/04/18 1843  WBC 10.1  HGB 10.8*  HCT 35.3*  MCV 86.3  PLT 712   Basic Metabolic Panel: Recent Labs  Lab 01/04/18 1843 01/05/18 0434 01/06/18 0529 01/07/18 0703  NA 138 136 135 136  K 4.1 4.1 3.9 4.0  CL 107 101 101 101  CO2 _0 GLUCOSE 116* 369* 205* 300*  BUN 18 23 31* 40*  CREATININE 1.35* 1.50* 1.50* 1.62*  CALCIUM 8.7* 8.8* 8.7*  8.5*   GFR: Estimated Creatinine Clearance: 53.7 mL/min (A) (by C-G formula based on SCr of 1.62 mg/dL (H)).  Cardiac Enzymes: Recent Labs  Lab 01/04/18 1843  TROPONINI <0.03   BNP (last 3 results) Recent Labs    02/25/17 1028  PROBNP 97.0   CBG: Recent Labs  Lab 01/06/18 0736 01/06/18 1158 01/06/18 1649 01/06/18 2110 01/07/18 0741  GLUCAP 214* 327* 166* 199* 277*   Urine analysis:    Component Value Date/Time   COLORURINE YELLOW 12/26/2015  2033   APPEARANCEUR CLEAR 12/26/2015 2033   LABSPEC 1.036 (H) 12/26/2015 2033   PHURINE 5.0 12/26/2015 2033   GLUCOSEU >1000 (A) 12/26/2015 2033   HGBUR NEGATIVE 12/26/2015 2033   BILIRUBINUR NEGATIVE 12/26/2015 2033   BILIRUBINUR neg 02/13/2013 1428   KETONESUR NEGATIVE 12/26/2015 2033   PROTEINUR NEGATIVE 12/26/2015 2033   UROBILINOGEN 0.2 12/26/2013 1135   NITRITE NEGATIVE 12/26/2015 2033   LEUKOCYTESUR NEGATIVE 12/26/2015 2033   Radiology Studies: No results found.  Scheduled Meds: . albuterol  2.5 mg Nebulization BID  . arformoterol  15 mcg Nebulization BID  . atorvastatin  40 mg Oral q1800  . budesonide (PULMICORT) nebulizer solution  0.5 mg Nebulization BID  . doxycycline  100 mg Oral Q12H  . furosemide  40 mg Oral BID  . heparin  5,000 Units Subcutaneous Q8H  . insulin aspart  0-5 Units Subcutaneous QHS  . insulin aspart  0-9 Units Subcutaneous TID WC  . insulin detemir  10 Units Subcutaneous QHS  . isosorbide-hydrALAZINE  1 tablet Oral BID  . methylPREDNISolone (SOLU-MEDROL) injection  40 mg Intravenous Q12H  . pantoprazole  40 mg Oral Daily  . sodium chloride flush  3 mL Intravenous Q12H   Continuous Infusions: . sodium chloride       LOS: 1 day    Time spent: 30 minutes   Barton Dubois, MD [facility] Pager [phone]  If 7PM-7AM, please contact night-coverage [email or web address] Password [email or web address] 01/07/2018, 10:31 AM

## 2018-01-08 LAB — GLUCOSE, CAPILLARY
Glucose-Capillary: 192 mg/dL — ABNORMAL HIGH (ref 70–99)
Glucose-Capillary: 268 mg/dL — ABNORMAL HIGH (ref 70–99)
Glucose-Capillary: 377 mg/dL — ABNORMAL HIGH (ref 70–99)
Glucose-Capillary: 287 mg/dL — ABNORMAL HIGH (ref 70–99)

## 2018-01-08 LAB — BASIC METABOLIC PANEL
Anion gap: 9 (ref 5–15)
BUN: 40 mg/dL — ABNORMAL HIGH (ref 8–23)
CHLORIDE: 99 mmol/L (ref 98–111)
CO2: 27 mmol/L (ref 22–32)
CREATININE: 1.47 mg/dL — AB (ref 0.61–1.24)
Calcium: 8.3 mg/dL — ABNORMAL LOW (ref 8.9–10.3)
GFR calc Af Amer: 55 mL/min — ABNORMAL LOW (ref >60)
GFR calc non Af Amer: 47 mL/min — ABNORMAL LOW (ref >60)
Glucose, Bld: 217 mg/dL — ABNORMAL HIGH (ref 70–99)
Potassium: 4 mmol/L (ref 3.5–5.1)
Sodium: 135 mmol/L (ref 135–145)

## 2018-01-08 MED ORDER — PREDNISONE 20 MG PO TABS
60.0000 mg | ORAL_TABLET | Freq: Every day | ORAL | Status: DC
  Administered 2018-01-09: 60 mg via ORAL
  Filled 2018-01-08: qty 3

## 2018-01-08 NOTE — Progress Notes (Signed)
PROGRESS NOTE    Kenneth Fuller  DEY:[id number] DOB: 13-Mar-1945 DOA: 01/04/2018 PCP: [name], [name], MD    Brief Narrative:  72 y.o. male with medical history significant for COPD with chronic hypoxic respiratory failure, chronic diastolic CHF, depression with anxiety, hypertension, type 2 diabetes mellitus, and OSA on CPAP, now presenting to the emergency department for evaluation of progressive shortness of breath and cough.  Patient reports that he developed a cough approximately 2 weeks ago, mainly nonproductive, and has also experienced insidious worsening in his chronic dyspnea since that time.  Shortness of breath worsened significantly today, he laid down, but this only seemed to worsen his breathing, and he asked his wife to call EMS for transport to the hospital.  He denies any chest pain, fevers, or chills.  Reports that his chronic mild leg swelling seems to be unchanged.  ED Course: Upon arrival to the ED, patient is found to be afebrile, saturating mid 90s on 2 L/min supplemental oxygen, tachypneic in the 30s, slightly tachycardic, and hypertensive to 160/105.  EKG features sinus tachycardia with rate 104.  Chest x-ray is notable for cardiomegaly with small pleural effusion and mild interstitial edema.  Chemistry panel features a creatinine 1.35, similar to priors.  CBC is notable for a hemoglobin of 10.8, down from 11.4 last May.  Troponin is undetectable and BNP elevated to 330.  Patient was given DuoNeb's, 125 mg IV Solu-Medrol, and 40 mg IV Lasix in the ED.  He has begun to diurese, reports some slight improvement, but remains dyspneic at rest. Admitted for further management of CHF and COPD exacerbation.   Assessment & Plan: 1-acute on chronic respiratory failure: In the setting of CHF and COPD exacerbation. -Patient chronically uses about 2 L of oxygen supplementation only at bedtime -Presented with symptoms of orthopnea, elevated BNP, vascular congestion on chest x-ray  and hypoxia. -Currently using 2.0 L of nasal cannula supplementation around the clock. -Feeling better; good response in urine output to diuretics; will transition to PO. -Will continue daily weights, strict intake and output, low-sodium diet and continue oral diuretics.  -For his COPD treatment will continue steroids (transitioning to oral regimen and continue tapering), continue duoneb, start flutter valve,  and continue treatment with Brovana and Pulmicort.   -Continue to wean oxygen supplementation as tolerated -complete treatment with oral doxycycline for associated bronchiectasis.  -If stable will discharge home in a.m. -Patient is still complaining of shortness of breath with exertion; even overall reports improvement.   2-Acute on chronic diastolic CHF (congestive heart failure) ([place]) -Continue treatment as mentioned above -Repeat 2D echo demonstrated grade 1 diastolic dysfunction, no wall motion abnormalities and decreasing his systolic function (ejection fraction 45-50%). -Case discussed with Dr. Domenic Polite who has recommended continue acute treatment for heart failure with close outpatient follow-up at discharge.  He is in agreement with the use of BiDil. -Will resume ARB at discharge once renal function and fluid overload stabilizes. -Continue daily weights and low sodium diet. -Continue strict intake and output. -Close follow-up to patient's electrolytes and renal function.  3-Diabetes mellitus type II, non insulin dependent ([place]): With nephropathy -CBGs elevated in the setting of his steroids use -Will continue the use of a sliding scale insulin and Levemir while inpatient -Anticipate a stabilization of his CBGs once his steroids completely tapered off. -Continue holding oral hypoglycemic agents while inpatient.  4-Hypertension associated with diabetes ([place]) -Overall stable and well controlled -Will continue holding ARB's acutely while actively diuresing him for CHF  exacerbation -Continue BiDil and continue beta-blocker  5-OSA -Continue nightly CPAP -patient instructed to be compliant with it  6-Depression with anxiety -Stable mood -No suicidal ideation -continue home antidepressant regimen.  7-hyperlipidemia -Continue statins  8-GERD -Continue PPI.  9-CKD stage 3 -GFR stable, Cr slightly up -will continue to monitor closely and minimize use of nephrotoxic agents while actively diuresing him  10-morbid obesity -Body mass index is 43.96 kg/m. -Low calorie diet, portion control and increase physical activity has been recommended.   DVT prophylaxis: Heparin Code Status: Full code Family Communication: Daughter at bedside Disposition Plan: Remains inpatient.  Continue oral Lasix and transition his steroids to p.o.  Continue nebulizer treatments and continue to wean oxygen supplementation as tolerated.    Consultants:   None  Procedures:   2D echo: Ejection fraction 45-50%, no wall motion abnormalities, grade 1 diastolic dysfunction.  See below for x-ray reports  Antimicrobials:  Anti-infectives (From admission, onward)   Start     Dose/Rate Route Frequency Ordered Stop   01/05/18 1545  doxycycline (VIBRA-TABS) tablet 100 mg     100 mg Oral Every 12 hours 01/05/18 1536     01/04/18 2200  azithromycin (ZITHROMAX) 500 mg in sodium chloride 0.9 % 250 mL IVPB  Status:  Discontinued     500 mg 250 mL/hr over 60 Minutes Intravenous Every 24 hours 01/04/18 2157 01/05/18 1536      Subjective: Afebrile, denies chest pain, no nausea, no vomiting.  Patient reports breathing continue improving, even is still requiring oxygen supplementation and feeling short of breath with exertion.  Objective: Vitals:   01/07/18 2053 01/08/18 0505 01/08/18 0822 01/08/18 1347  BP: (!) 144/84 (!) 151/92  134/75  Pulse: 83 86  96  Resp: 18 19  (!) 22  Temp: 97.7 F (36.5 C) (!) 97.5 F (36.4 C)  97.8 F (36.6 C)  TempSrc: Oral Oral  Oral  SpO2:  98% 96% 97% 95%  Weight:  127.3 kg    Height:        Intake/Output Summary (Last 24 hours) at 01/08/2018 1456 Last data filed at 01/08/2018 0500 Gross per 24 hour  Intake 360 ml  Output 1900 ml  Net -1540 ml   Filed Weights   01/06/18 0737 01/07/18 0500 01/08/18 0505  Weight: 127.3 kg 127.5 kg 127.3 kg    Examination: General exam: Alert, awake, oriented x 3; reports improvement in his breathing and is still having good urine output.  Still wearing oxygen supplementation and reported having a restless night.  No chest pain.  Patient is still short of breath with exertion. Respiratory system: No frank crackles, improved air movement bilaterally; mild expiratory wheezing, positive rhonchi.  No using accessory muscle.   Cardiovascular system:RRR. No murmurs, rubs, gallops. Gastrointestinal system: Abdomen is obese, nondistended, soft and nontender. No organomegaly or masses felt. Normal bowel sounds heard. Central nervous system: Alert and oriented. No focal neurological deficits. Extremities: No cyanosis or clubbing.  No edema appreciated on his lower extremities. Skin: No rashes, lesions or ulcers Psychiatry: Judgement and insight appear normal. Mood & affect appropriate.   Data Reviewed: I have personally reviewed following labs and imaging studies  CBC: Recent Labs  Lab 01/04/18 1843  WBC 10.1  HGB 10.8*  HCT 35.3*  MCV 86.3  PLT 213   Basic Metabolic Panel: Recent Labs  Lab 01/04/18 1843 01/05/18 0434 01/06/18 0529 01/07/18 0703 01/08/18 0516  NA 138 136 135 136 135  K 4.1 4.1 3.9 4.0 4.0  CL 107 101 101 101 99  CO2 _0 GLUCOSE 116* 369* 205* 300* 217*  BUN 18 23 31* 40* 40*  CREATININE 1.35* 1.50* 1.50* 1.62* 1.47*  CALCIUM 8.7* 8.8* 8.7* 8.5* 8.3*   GFR: Estimated Creatinine Clearance: 59.1 mL/min (A) (by C-G formula based on SCr of 1.47 mg/dL (H)).  Cardiac Enzymes: Recent Labs  Lab 01/04/18 1843  TROPONINI <0.03   BNP (last 3  results) Recent Labs    02/25/17 1028  PROBNP 97.0   CBG: Recent Labs  Lab 01/07/18 1154 01/07/18 1610 01/07/18 2050 01/08/18 0729 01/08/18 1206  GLUCAP 281* 260* 209* 192* 268*   Urine analysis:    Component Value Date/Time   COLORURINE YELLOW 12/26/2015 2033   APPEARANCEUR CLEAR 12/26/2015 2033   LABSPEC 1.036 (H) 12/26/2015 2033   PHURINE 5.0 12/26/2015 2033   GLUCOSEU >1000 (A) 12/26/2015 2033   HGBUR NEGATIVE 12/26/2015 2033   BILIRUBINUR NEGATIVE 12/26/2015 2033   BILIRUBINUR neg 02/13/2013 1428   KETONESUR NEGATIVE 12/26/2015 2033   PROTEINUR NEGATIVE 12/26/2015 2033   UROBILINOGEN 0.2 12/26/2013 1135   NITRITE NEGATIVE 12/26/2015 2033   LEUKOCYTESUR NEGATIVE 12/26/2015 2033   Radiology Studies: No results found.  Scheduled Meds: . albuterol  2.5 mg Nebulization BID  . arformoterol  15 mcg Nebulization BID  . atorvastatin  40 mg Oral q1800  . budesonide (PULMICORT) nebulizer solution  0.5 mg Nebulization BID  . doxycycline  100 mg Oral Q12H  . furosemide  40 mg Oral BID  . heparin  5,000 Units Subcutaneous Q8H  . insulin aspart  0-5 Units Subcutaneous QHS  . insulin aspart  0-9 Units Subcutaneous TID WC  . insulin detemir  10 Units Subcutaneous QHS  . isosorbide-hydrALAZINE  1 tablet Oral BID  . pantoprazole  40 mg Oral Daily  . predniSONE  60 mg Oral Q breakfast  . sodium chloride flush  3 mL Intravenous Q12H   Continuous Infusions: . sodium chloride       LOS: 2 days    Time spent: 25 minutes   Barton Dubois, MD [facility] Pager [phone]  If 7PM-7AM, please contact night-coverage [email or web address] Password [email or web address] 01/08/2018, 2:56 PM

## 2018-01-09 DIAGNOSIS — I5043 Acute on chronic combined systolic (congestive) and diastolic (congestive) heart failure: Secondary | ICD-10-CM

## 2018-01-09 LAB — BASIC METABOLIC PANEL
Anion gap: 7 (ref 5–15)
BUN: 38 mg/dL — AB (ref 8–23)
CO2: 32 mmol/L (ref 22–32)
Calcium: 8.4 mg/dL — ABNORMAL LOW (ref 8.9–10.3)
Chloride: 101 mmol/L (ref 98–111)
Creatinine, Ser: 1.51 mg/dL — ABNORMAL HIGH (ref 0.61–1.24)
GFR calc Af Amer: 53 mL/min — ABNORMAL LOW (ref >60)
GFR calc non Af Amer: 46 mL/min — ABNORMAL LOW (ref >60)
Glucose, Bld: 143 mg/dL — ABNORMAL HIGH (ref 70–99)
Potassium: 4.3 mmol/L (ref 3.5–5.1)
Sodium: 140 mmol/L (ref 135–145)

## 2018-01-09 LAB — GLUCOSE, CAPILLARY
Glucose-Capillary: 121 mg/dL — ABNORMAL HIGH (ref 70–99)
Glucose-Capillary: 244 mg/dL — ABNORMAL HIGH (ref 70–99)

## 2018-01-09 MED ORDER — ISOSORBIDE DINITRATE 30 MG PO TABS: 30.0000 mg | ORAL_TABLET | Freq: Two times a day (BID) | ORAL | 1 refills | Status: DC

## 2018-01-09 MED ORDER — METOPROLOL TARTRATE 25 MG PO TABS: 12.5000 mg | ORAL_TABLET | Freq: Two times a day (BID) | ORAL | 3 refills | Status: DC

## 2018-01-09 MED ORDER — POTASSIUM CHLORIDE ER 20 MEQ PO TBCR: 20.0000 meq | EXTENDED_RELEASE_TABLET | Freq: Every day | ORAL | 1 refills | Status: DC

## 2018-01-09 MED ORDER — DOXYCYCLINE HYCLATE 100 MG PO TABS: 100.0000 mg | ORAL_TABLET | Freq: Two times a day (BID) | ORAL | 0 refills | Status: AC

## 2018-01-09 MED ORDER — BUDESONIDE-FORMOTEROL FUMARATE 160-4.5 MCG/ACT IN AERO: 2.0000 | INHALATION_SPRAY | Freq: Two times a day (BID) | RESPIRATORY_TRACT | 3 refills | Status: DC

## 2018-01-09 MED ORDER — PREDNISONE 20 MG PO TABS: ORAL_TABLET | ORAL | 0 refills | Status: DC

## 2018-01-09 MED ORDER — FUROSEMIDE 40 MG PO TABS: 40.0000 mg | ORAL_TABLET | Freq: Two times a day (BID) | ORAL | 1 refills | Status: DC

## 2018-01-09 MED ORDER — BENZONATATE 200 MG PO CAPS: 200.0000 mg | ORAL_CAPSULE | Freq: Three times a day (TID) | ORAL | 0 refills | Status: DC | PRN

## 2018-01-09 MED ORDER — HYDRALAZINE HCL 25 MG PO TABS: 25.0000 mg | ORAL_TABLET | Freq: Two times a day (BID) | ORAL | 1 refills | Status: DC

## 2018-01-09 MED ORDER — TELMISARTAN 40 MG PO TABS: 40.0000 mg | ORAL_TABLET | Freq: Every day | ORAL | Status: DC

## 2018-01-09 NOTE — Progress Notes (Signed)
SATURATION QUALIFICATIONS: (This note is used to comply with regulatory documentation for home oxygen)  Patient Saturations on Room Air at Rest = 95%  Patient Saturations on Room Air while Ambulating = 90%  Patient Saturations on 2 Liters of oxygen while Ambulating = n/a  Please briefly explain why patient needs home oxygen: Pt does not require oxygen  Garwin Brothers Dishmon, RN

## 2018-01-09 NOTE — Discharge Summary (Signed)
Physician Discharge Summary  Kenneth Fuller:[id number] DOB: 14-Jun-1945 DOA: 01/04/2018  PCP: [name], [name], MD  Admit date: 01/04/2018 Discharge date: 01/09/2018  Time spent: 35 minutes  Recommendations for Outpatient Follow-up:  Repeat basic metabolic panel to follow electrolytes and renal function Reassess blood pressure and further adjust antihypertensive regimen Patient needs outpatient follow-up with his cardiologist in 2 weeks (Dr. Sallyanne Kuster) and also will benefit of follow-up with his pulmonologist (Dr. Halford Chessman).  Discharge Diagnoses:  Principal Problem:   Acute on chronic diastolic CHF (congestive heart failure) (HCC) Active Problems:   Diabetes mellitus type II, non insulin dependent ([place])   Hypertension associated with diabetes (HCC)   OSA on CPAP   Obesity, Class III, BMI 40-49.9 (morbid obesity) ([place])   Acute on chronic respiratory failure with hypoxia (HCC)   Depression with anxiety   CKD (chronic kidney disease), stage III (HCC)   COPD with acute exacerbation (HCC)   Hypertensive urgency   CHF (congestive heart failure) ([place])   Discharge Condition: Stable and improved.  Patient discharged home with instruction to follow-up with PCP in 3 days and also to follow-up with his cardiologist in 2 weeks.  Diet recommendation: Heart healthy, low calorie diet and modify carbohydrates.  Filed Weights   01/07/18 0500 01/08/18 0505 01/09/18 0500  Weight: 127.5 kg 127.3 kg 127.4 kg    History of present illness:  As per H&P written by Dr. Myna Hidalgo on 01/04/2017 72 y.o.malewith medical history significant forCOPD with chronic hypoxic respiratory failure, chronic diastolic CHF, depression with anxiety, hypertension, type 2 diabetes mellitus, and OSA on CPAP, now presenting to the emergency department for evaluation of progressive shortness of breath and cough. Patient reports that he developed a cough approximately 2 weeks ago, mainly nonproductive, and has also  experienced insidious worsening in his chronic dyspnea since that time. Shortness of breath worsened significantly today, he laid down, but this only seemed to worsen his breathing, and he asked his wife to call EMS for transport to the hospital. He denies any chest pain, fevers, or chills. Reports that his chronic mild leg swelling seems to be unchanged.  ED Course:Upon arrival to the ED, patient is found to be afebrile, saturating mid 90s on 2 L/min supplemental oxygen, tachypneic in the 30s, slightly tachycardic, and hypertensive to 160/105. EKG features sinus tachycardia with rate 104. Chest x-ray is notable for cardiomegaly with small pleural effusion and mild interstitial edema. Chemistry panel features a creatinine 1.35, similar to priors. CBC is notable for a hemoglobin of 10.8, down from 11.4 last May. Troponin is undetectable and BNP elevated to 330. Patient was given DuoNeb's, 125 mg IV Solu-Medrol, and 40 mg IV Lasix in the ED. He has begun to diurese, reports some slight improvement, but remains dyspneic at rest. Admitted for further management of CHF and COPD exacerbation.  Hospital Course:  1-acute on chronic respiratory failure: In the setting of CHF and COPD exacerbation. -Patient chronically uses about 2 L of oxygen supplementation only at bedtime -Presented with symptoms of orthopnea, elevated BNP, vascular congestion on chest x-ray and hypoxia. -at discharge only using oxygen at night time (back to baseline) -Feeling better; good response in urine output to diuretics; will discharge on lasix 26m BID -Will continue daily weights, low-sodium diet and  outpatient follow-up with cardiology service. -For his COPD treatment will continue steroids (transitioning to oral regimen and continue tapering), continue duoneb, flutter valve,  and continue treatment with as needed albuterol for rescue and initiation of Symbicort. -complete  treatment with oral doxycycline for associated  bronchiectasis.   2-Acute on chronic diastolic CHF (congestive heart failure) ([place]) -Continue treatment as mentioned above -Repeat 2D echo demonstrated grade 1 diastolic dysfunction, no wall motion abnormalities and decreasing his systolic function (ejection fraction 45-50%). -Case discussed with Dr. Domenic Polite who has recommended continue acute treatment for heart failure with close outpatient follow-up at discharge.  He is in agreement with the use of isordil and hydralazine. -Will resume ARB at follow up visit, once renal function stability is proved.  -Continue daily weights and low sodium diet. -repeat electrolytes and renal function at follow up visit  3-Diabetes mellitus type II, non insulin dependent ([place]): With nephropathy -CBGs elevated in the setting of his steroids use -Anticipate a stabilization of his CBGs once his steroids completely tapered off. -resume oral hypoglycemic agents at discharge -Patient advised to follow modified carbohydrate diet.  4-Hypertension associated with diabetes ([place]) -Overall stable and well controlled -Will continue holding ARB's acutely due to elevation in Cr and the need for diuresis. -discharge on beta-blocker, lasix isordil and hydralazine.  5-OSA -Continue nightly CPAP -patient instructed to be compliant with it  6-Depression with anxiety -Stable mood -No suicidal ideation -continue home antidepressant regimen.  7-hyperlipidemia -Continue statins  8-GERD -Continue PPI.  9-CKD stage 3 -GFR stable, Cr 1.4 -Continue to monitor closely his basic metabolic panel to assess renal function trend. -Patient advised to maintain adequate hydration. -Continue holding Micardis also follow-up with PCP.  10-morbid obesity -Body mass index is 43.96 kg/m. -Low calorie diet, portion control and increase physical activity has been recommended.  Procedures:  2D echo: Ejection fraction 45-50%, no wall motion abnormalities, grade 1  diastolic dysfunction.  See below for x-ray reports  Consultations:  None  Discharge Exam: Vitals:   01/09/18 0908 01/09/18 1504  BP: 138/71 (!) 159/83  Pulse: 95 87  Resp:  16  Temp:  98 F (36.7 C)  SpO2: 95% 93%   General exam: Alert, awake, oriented x 3; reports significant improvement in his breathing and is having good urine output.    Currently off oxygen supplementation and able to speak in full sentences.  Patient reported having a better night sleep.  No chest pain and expressed in coughing spells. Respiratory system: No frank crackles, improved air movement bilaterally; mild expiratory wheezing, scattered rhonchi.  No using accessory muscle.   Cardiovascular system:RRR. No murmurs, rubs, gallops. Gastrointestinal system: Abdomen is obese, nondistended, soft and nontender. No organomegaly or masses felt. Normal bowel sounds heard. Central nervous system: Alert and oriented. No focal neurological deficits. Extremities: No cyanosis or clubbing.  No edema appreciated on his lower extremities. Skin: No rashes, lesions or ulcers Psychiatry: Judgement and insight appear normal. Mood & affect appropriate.   Discharge Instructions   Discharge Instructions    (HEART FAILURE PATIENTS) Call MD:  Anytime you have any of the following symptoms: 1) 3 pound weight gain in 24 hours or 5 pounds in 1 week 2) shortness of breath, with or without a dry hacking cough 3) swelling in the hands, feet or stomach 4) if you have to sleep on extra pillows at night in order to breathe.   Complete by:  As directed    Diet - low sodium heart healthy   Complete by:  As directed    Discharge instructions   Complete by:  As directed    Take medications as prescribed Follow heart healthy/low-sodium diet (less than 2-2.5 g of sodium daily) Maintain adequate hydration Arrange follow-up  with PCP in 10 days Follow-up with cardiology service (Dr. Sallyanne Kuster) in 2 weeks. Check your weight on daily  basis Follow-up with pulmonology and nephrologist as previously scheduled.     Allergies as of 01/09/2018      Reactions   Amlodipine Besy-benazepril Hcl Swelling, Other (See Comments)   Makes tongue swell (lotrel)   Oxycodone Itching   Phenergan [promethazine Hcl] Other (See Comments)   "I can't remember."   Hydrocodone Itching   Can tolerate in low doses      Medication List    STOP taking these medications   chlorthalidone 25 MG tablet Commonly known as:  HYGROTON   dextromethorphan-guaiFENesin 30-600 MG 12hr tablet Commonly known as:  MUCINEX DM     TAKE these medications   albuterol 108 (90 Base) MCG/ACT inhaler Commonly known as:  PROVENTIL HFA;VENTOLIN HFA Inhale 2 puffs into the lungs every 6 (six) hours as needed for wheezing or shortness of breath.   atorvastatin 40 MG tablet Commonly known as:  LIPITOR TAKE 1 TABLET BY MOUTH ONCE DAILY What changed:  when to take this   benzonatate 200 MG capsule Commonly known as:  TESSALON Take 1 capsule (200 mg total) by mouth 3 (three) times daily as needed (Recurrent uncontrolled coughing spells.).   budesonide-formoterol 160-4.5 MCG/ACT inhaler Commonly known as:  SYMBICORT Inhale 2 puffs into the lungs 2 (two) times daily.   clonazePAM 0.5 MG tablet Commonly known as:  KLONOPIN Take 1 tablet (0.5 mg total) by mouth at bedtime as needed for anxiety. What changed:  when to take this   doxycycline 100 MG tablet Commonly known as:  VIBRA-TABS Take 1 tablet (100 mg total) by mouth every 12 (twelve) hours for 3 days.   furosemide 40 MG tablet Commonly known as:  LASIX Take 1 tablet (40 mg total) by mouth 2 (two) times daily. What changed:  See the new instructions.   glimepiride 4 MG tablet Commonly known as:  AMARYL Take 1 tablet (4 mg total) by mouth daily with breakfast. What changed:    how much to take  when to take this   glucose blood test strip Test 1X per day and prn One Touch Ultra test strips    hydrALAZINE 25 MG tablet Commonly known as:  APRESOLINE Take 1 tablet (25 mg total) by mouth 2 (two) times daily.   isosorbide dinitrate 30 MG tablet Commonly known as:  ISORDIL Take 1 tablet (30 mg total) by mouth 2 (two) times daily.   metFORMIN 1000 MG tablet Commonly known as:  GLUCOPHAGE TAKE 1 TABLET BY MOUTH TWICE DAILY WITH MEALS   metoprolol tartrate 25 MG tablet Commonly known as:  LOPRESSOR Take 0.5 tablets (12.5 mg total) by mouth 2 (two) times daily.   omeprazole 40 MG capsule Commonly known as:  PRILOSEC TAKE 1 CAPSULE BY MOUTH ONCE DAILY What changed:  when to take this   ONETOUCH DELICA LANCETS 51V Misc 1 each by Does not apply route daily. Test 1X per day and prn   OXYGEN Inhale 2 L into the lungs at bedtime. 2lpm with sleep   Potassium Chloride ER 20 MEQ Tbcr Take 20 mEq by mouth daily. What changed:    medication strength  how much to take  when to take this   predniSONE 20 MG tablet Commonly known as:  DELTASONE Take 3 tablets by mouth daily x1 day; then 2 tablets by mouth daily x2 days; then 1 tablet by mouth daily x2 days; then 0.5  tablet by mouth daily x3 days and stop prednisone.   PRODIGY BLOOD GLUCOSE MONITOR w/Device Kit 1 Units by Does not apply route daily. Check blood sugars 1X per day-- dx 250.02   sertraline 50 MG tablet Commonly known as:  ZOLOFT Take 50 mg by mouth every morning.   SYSTANE OP Apply 1 drop to eye daily as needed (for dry eye relief).   telmisartan 40 MG tablet Commonly known as:  MICARDIS Take 1 tablet (40 mg total) by mouth daily. Hold this medication until follow-up with cardiology service. What changed:  additional instructions   VITAMIN D PO Take 5,000 Units by mouth every morning.      Allergies  Allergen Reactions  . Amlodipine Besy-Benazepril Hcl Swelling and Other (See Comments)    Makes tongue swell (lotrel)  . Oxycodone Itching  . Phenergan [Promethazine Hcl] Other (See Comments)    "I  can't remember."  . Hydrocodone Itching    Can tolerate in low doses   Follow-up Information    [name], [name], MD. Schedule an appointment as soon as possible for a visit in 10 day(s).   Specialties:  Family Medicine, Cardiology Contact information: [place] [address] [phone]        Croitoru, Dani Gobble, MD. Schedule an appointment as soon as possible for a visit in 2 week(s).   Specialty:  Cardiology Contact information: [address] [place] [address] [phone]           The results of significant diagnostics from this hospitalization (including imaging, microbiology, ancillary and laboratory) are listed below for reference.    Significant Diagnostic Studies: Dg Chest 2 View  Result Date: 01/04/2018 CLINICAL DATA:  Shortness of breath EXAM: CHEST - 2 VIEW COMPARISON:  07/04/2017 FINDINGS: Surgical changes in the cervical spine. Trace pleural effusions. Cardiomegaly with vascular congestion. Mild diffuse interstitial opacity suspect for edema. IMPRESSION: Borderline to mild cardiomegaly with small pleural effusion, vascular congestion and mild interstitial edema Electronically Signed   By: Donavan Foil M.D.   On: 01/04/2018 19:24   Labs: Basic Metabolic Panel: Recent Labs  Lab 01/05/18 0434 01/06/18 0529 01/07/18 0703 01/08/18 0516 01/09/18 0527  NA 136 135 136 135 140  K 4.1 3.9 4.0 4.0 4.3  CL 101 101 101 99 101  CO2 _0 32  GLUCOSE 369* 205* 300* 217* 143*  BUN 23 31* 40* 40* 38*  CREATININE 1.50* 1.50* 1.62* 1.47* 1.51*  CALCIUM 8.8* 8.7* 8.5* 8.3* 8.4*   CBC: Recent Labs  Lab 01/04/18 1843  WBC 10.1  HGB 10.8*  HCT 35.3*  MCV 86.3  PLT 260   Cardiac Enzymes: Recent Labs  Lab 01/04/18 1843  TROPONINI <0.03   BNP: BNP (last 3 results) Recent Labs    01/04/18 1843  BNP 330.0*    ProBNP (last 3 results) Recent Labs    02/25/17 1028  PROBNP 97.0    CBG: Recent Labs  Lab  01/08/18 1206 01/08/18 1620 01/08/18 2107 01/09/18 0757 01/09/18 1209  GLUCAP 268* 377* 287* 121* 244*   Signed:  Barton Dubois MD.  [facility] 01/09/2018, 3:53 PM

## 2018-01-09 NOTE — Care Management Note (Signed)
Case Management Note  Patient Details  Name: TRAVERS GOODLEY MRN: [id number] Date of Birth: [birth year]  Action/Plan: DC home today. Pt has weaned from supplemental oxygen. CM contacted [facility] to verify pt has continuous oxygen with port tanks pta. Plans to continue to wear O2 HS after DC. Pt will continued to be followed by [facility] at DC. Pt concerned about cost of new medications. CM could not identify new medication that may be costly on pt's MAR. CM encouraged pt to discuss further with MD and offered to do a benefits check if new costly med identified.   Expected Discharge Date:     01/09/18             Expected Discharge Plan:  Home/Self Care  In-House Referral:  NA  Discharge planning Services  CM Consult  Post Acute Care Choice:  NA Choice offered to:  NA  Status of Service:  Completed, signed off  Sherald Barge, RN 01/09/2018, 11:00 AM

## 2018-01-09 NOTE — Progress Notes (Signed)
Patient is to be discharged home and in stable condition. Patient's IV and telemetry removed, WNL. Patient given discharge instructions and verbalized understanding. Patient to be escorted out by staff via wheelchair.  Celestia Khat, RN

## 2018-01-09 NOTE — Care Management Important Message (Signed)
Important Message  Patient Details  Name: Kenneth Fuller MRN: [id number] Date of Birth: March 20, 1945   Medicare Important Message Given:  Yes    Shelda Altes 01/09/2018, 2:20 PM

## 2018-01-10 ENCOUNTER — Telehealth: Payer: Self-pay | Admitting: *Deleted

## 2018-01-10 DIAGNOSIS — I5032 Chronic diastolic (congestive) heart failure: Secondary | ICD-10-CM

## 2018-01-10 NOTE — Telephone Encounter (Signed)
Call Completed and Appointment Scheduled: Yes, Date: 01/13/18 (pre-existing appt. Will add appt notes for TCM) with Dr [name]   DISCHARGE INFORMATION Date of Discharge:01/09/2018  Discharge Facility: [facility]  Principal Discharge Diagnosis: Acute on chronic heart failure  Patient and/or caregiver is knowledgeable of his/her condition(s) and treatment: Yes  MEDICATION RECONCILIATION Current medication list reviewed with patient:Yes  Outpatient Encounter Medications as of 01/10/2018  Medication Sig  . albuterol (PROVENTIL HFA;VENTOLIN HFA) 108 (90 Base) MCG/ACT inhaler Inhale 2 puffs into the lungs every 6 (six) hours as needed for wheezing or shortness of breath.  [name] atorvastatin (LIPITOR) 40 MG tablet TAKE 1 TABLET BY MOUTH ONCE DAILY (Patient taking differently: Take 40 mg by mouth every evening. )  . benzonatate (TESSALON) 200 MG capsule Take 1 capsule (200 mg total) by mouth 3 (three) times daily as needed (Recurrent uncontrolled coughing spells.).  [name] Blood Glucose Monitoring Suppl (PRODIGY BLOOD GLUCOSE MONITOR) W/DEVICE KIT 1 Units by Does not apply route daily. Check blood sugars 1X per day-- dx 250.02  . budesonide-formoterol (SYMBICORT) 160-4.5 MCG/ACT inhaler Inhale 2 puffs into the lungs 2 (two) times daily.  . Cholecalciferol (VITAMIN D PO) Take 5,000 Units by mouth every morning.   . clonazePAM (KLONOPIN) 0.5 MG tablet Take 1 tablet (0.5 mg total) by mouth at bedtime as needed for anxiety. (Patient taking differently: Take 0.5 mg by mouth at bedtime. )  . doxycycline (VIBRA-TABS) 100 MG tablet Take 1 tablet (100 mg total) by mouth every 12 (twelve) hours for 3 days.  . furosemide (LASIX) 40 MG tablet Take 1 tablet (40 mg total) by mouth 2 (two) times daily.  [name] glimepiride (AMARYL) 4 MG tablet Take 1 tablet (4 mg total) by mouth daily with breakfast. (Patient taking differently: Take 2 mg by mouth every morning. )  . glucose blood test strip Test 1X per day and prn One Touch  Ultra test strips  . hydrALAZINE (APRESOLINE) 25 MG tablet Take 1 tablet (25 mg total) by mouth 2 (two) times daily.  . isosorbide dinitrate (ISORDIL) 30 MG tablet Take 1 tablet (30 mg total) by mouth 2 (two) times daily.  . metFORMIN (GLUCOPHAGE) 1000 MG tablet TAKE 1 TABLET BY MOUTH TWICE DAILY WITH MEALS (Patient taking differently: Take 1,000 mg by mouth 2 (two) times daily with a meal. )  . metoprolol tartrate (LOPRESSOR) 25 MG tablet Take 0.5 tablets (12.5 mg total) by mouth 2 (two) times daily.  [name] omeprazole (PRILOSEC) 40 MG capsule TAKE 1 CAPSULE BY MOUTH ONCE DAILY (Patient taking differently: Take 40 mg by mouth every morning. )  . ONETOUCH DELICA LANCETS 30Z MISC 1 each by Does not apply route daily. Test 1X per day and prn  . OXYGEN Inhale 2 L into the lungs at bedtime. 2lpm with sleep   . Polyethyl Glycol-Propyl Glycol (SYSTANE OP) Apply 1 drop to eye daily as needed (for dry eye relief).   . potassium chloride 20 MEQ TBCR Take 20 mEq by mouth daily.  . predniSONE (DELTASONE) 20 MG tablet Take 3 tablets by mouth daily x1 day; then 2 tablets by mouth daily x2 days; then 1 tablet by mouth daily x2 days; then 0.5 tablet by mouth daily x3 days and stop prednisone.  . sertraline (ZOLOFT) 50 MG tablet Take 50 mg by mouth every morning.  [name] telmisartan (MICARDIS) 40 MG tablet Take 1 tablet (40 mg total) by mouth daily. Hold this medication until follow-up with cardiology service.   No facility-administered encounter medications  on file as of 01/10/2018.     Discharge Medications reviewed and reconciled with current medications.yes  Patient is able to obtain needed medications:Yes  ACTIVITIES OF DAILY LIVING  Is the patient able to perform his/her own ADLs: Yes.    Patient is receiving home health services: No. Has [facility] services  PATIENT EDUCATION Questions/Concerns Discussed: Questioned if he needed to setup his own cardiology appointment.    Recommendations for Outpatient Follow-up:   Repeat basic metabolic panel to follow electrolytes and renal function Reassess blood pressure and further adjust antihypertensive regimen Patient needs outpatient follow-up with his cardiologist in 2 weeks (Dr. Sallyanne Kuster) and also will benefit of follow-up with his pulmonologist (Dr. Halford Chessman).  Orders Placed This Encounter  Procedures  . Ambulatory referral to Cardiology    Referral Priority:   Urgent    Referral Type:   Consultation    Referral Reason:   Specialty Services Required    Referred to Provider:   Sanda Klein, MD    Requested Specialty:   Cardiology    Number of Visits Requested:   1  Established patient that I would like for them to call and schedule hosp f/u within 2 weeks of discharge date of 01/09/2018.   [name], RN-BC, BSN Nurse Case Manager [facility] Ph: [phone]

## 2018-01-11 ENCOUNTER — Other Ambulatory Visit: Payer: PPO

## 2018-01-11 ENCOUNTER — Telehealth: Payer: Self-pay | Admitting: Family Medicine

## 2018-01-11 DIAGNOSIS — I10 Essential (primary) hypertension: Secondary | ICD-10-CM | POA: Diagnosis not present

## 2018-01-11 DIAGNOSIS — I509 Heart failure, unspecified: Secondary | ICD-10-CM | POA: Diagnosis not present

## 2018-01-11 DIAGNOSIS — N183 Chronic kidney disease, stage 3 (moderate): Secondary | ICD-10-CM | POA: Diagnosis not present

## 2018-01-11 DIAGNOSIS — E1129 Type 2 diabetes mellitus with other diabetic kidney complication: Secondary | ICD-10-CM | POA: Diagnosis not present

## 2018-01-11 DIAGNOSIS — R809 Proteinuria, unspecified: Secondary | ICD-10-CM | POA: Diagnosis not present

## 2018-01-11 DIAGNOSIS — Z79899 Other long term (current) drug therapy: Secondary | ICD-10-CM | POA: Diagnosis not present

## 2018-01-12 ENCOUNTER — Other Ambulatory Visit: Payer: Self-pay

## 2018-01-12 ENCOUNTER — Ambulatory Visit (INDEPENDENT_AMBULATORY_CARE_PROVIDER_SITE_OTHER): Payer: PPO | Admitting: Family Medicine

## 2018-01-12 ENCOUNTER — Encounter: Payer: Self-pay | Admitting: Family Medicine

## 2018-01-12 VITALS — BP 125/73 | HR 69 | Temp 97.2°F | Ht 67.0 in | Wt 284.0 lb

## 2018-01-12 DIAGNOSIS — I5021 Acute systolic (congestive) heart failure: Secondary | ICD-10-CM

## 2018-01-12 NOTE — Progress Notes (Signed)
BP 125/73   Pulse 69   Temp (!) 97.2 F (36.2 C) (Oral)   Ht 5' 7" (1.702 m)   Wt 284 lb (128.8 kg)   SpO2 92%   BMI 44.48 kg/m    Subjective:    Patient ID: Kenneth Fuller, male    DOB: 04-17-1945, 72 y.o.   MRN: [id number]  HPI: Kenneth Fuller is a 72 y.o. male presenting on 01/12/2018 for Hospitalization Follow-up (AP CHF, COPD)   HPI Hospital follow-up for CHF and COPD Patient is coming in for hospital follow-up.  Patient was in the hospital from 01/04/2017 until 01/09/2017.  Patient since leaving the hospital says that his swelling has come down and his fluid has improved and his breathing has improved but is still just feeling fatigued and does not have the energy that he had before.  He says his energy is just sit down and it is gradually coming back but slowly.  He does have a hospital follow-up appointment with his cardiologist as well.  Patient says is been taking his daily weights and it has been stable  Relevant past medical, surgical, family and social history reviewed and updated as indicated. Interim medical history since our last visit reviewed. Allergies and medications reviewed and updated.  Review of Systems  Constitutional: Negative for chills and fever.  Eyes: Negative for visual disturbance.  Respiratory: Positive for shortness of breath. Negative for cough and wheezing.   Cardiovascular: Negative for chest pain and leg swelling.  Musculoskeletal: Negative for back pain and gait problem.  Skin: Negative for rash.  Neurological: Negative for dizziness, weakness, light-headedness and numbness.  All other systems reviewed and are negative.   Per HPI unless specifically indicated above   Allergies as of 01/12/2018      Reactions   Amlodipine Besy-benazepril Hcl Swelling, Other (See Comments)   Makes tongue swell (lotrel)   Oxycodone Itching   Phenergan [promethazine Hcl] Other (See Comments)   "I can't remember."   Hydrocodone Itching   Can  tolerate in low doses      Medication List        Accurate as of 01/12/18 11:59 PM. Always use your most recent med list.          albuterol 108 (90 Base) MCG/ACT inhaler Commonly known as:  PROVENTIL HFA;VENTOLIN HFA Inhale 2 puffs into the lungs every 6 (six) hours as needed for wheezing or shortness of breath.   atorvastatin 40 MG tablet Commonly known as:  LIPITOR TAKE 1 TABLET BY MOUTH ONCE DAILY   budesonide-formoterol 160-4.5 MCG/ACT inhaler Commonly known as:  SYMBICORT Inhale 2 puffs into the lungs 2 (two) times daily.   clonazePAM 0.5 MG tablet Commonly known as:  KLONOPIN Take 1 tablet (0.5 mg total) by mouth at bedtime as needed for anxiety.   doxycycline 100 MG tablet Commonly known as:  VIBRA-TABS Take 1 tablet (100 mg total) by mouth every 12 (twelve) hours for 3 days.   furosemide 40 MG tablet Commonly known as:  LASIX Take 1 tablet (40 mg total) by mouth 2 (two) times daily.   glimepiride 4 MG tablet Commonly known as:  AMARYL Take 1 tablet (4 mg total) by mouth daily with breakfast.   glucose blood test strip Test 1X per day and prn One Touch Ultra test strips   hydrALAZINE 25 MG tablet Commonly known as:  APRESOLINE Take 1 tablet (25 mg total) by mouth 2 (two) times daily.   isosorbide dinitrate 30  MG tablet Commonly known as:  ISORDIL Take 1 tablet (30 mg total) by mouth 2 (two) times daily.   metFORMIN 1000 MG tablet Commonly known as:  GLUCOPHAGE TAKE 1 TABLET BY MOUTH TWICE DAILY WITH MEALS   metoprolol tartrate 25 MG tablet Commonly known as:  LOPRESSOR Take 0.5 tablets (12.5 mg total) by mouth 2 (two) times daily.   omeprazole 40 MG capsule Commonly known as:  PRILOSEC TAKE 1 CAPSULE BY MOUTH ONCE DAILY   ONETOUCH DELICA LANCETS 32R Misc 1 each by Does not apply route daily. Test 1X per day and prn   OXYGEN Inhale 2 L into the lungs at bedtime. 2lpm with sleep   Potassium Chloride ER 20 MEQ Tbcr Take 20 mEq by mouth daily.    predniSONE 20 MG tablet Commonly known as:  DELTASONE Take 3 tablets by mouth daily x1 day; then 2 tablets by mouth daily x2 days; then 1 tablet by mouth daily x2 days; then 0.5 tablet by mouth daily x3 days and stop prednisone.   PRODIGY BLOOD GLUCOSE MONITOR w/Device Kit 1 Units by Does not apply route daily. Check blood sugars 1X per day-- dx 250.02   sertraline 50 MG tablet Commonly known as:  ZOLOFT Take 50 mg by mouth every morning.   SYSTANE OP Apply 1 drop to eye daily as needed (for dry eye relief).   telmisartan 40 MG tablet Commonly known as:  MICARDIS Take 1 tablet (40 mg total) by mouth daily. Hold this medication until follow-up with cardiology service.   VITAMIN D PO Take 5,000 Units by mouth every morning.          Objective:    BP 125/73   Pulse 69   Temp (!) 97.2 F (36.2 C) (Oral)   Ht 5' 7" (1.702 m)   Wt 284 lb (128.8 kg)   SpO2 92%   BMI 44.48 kg/m   Wt Readings from Last 3 Encounters:  01/17/18 275 lb 6.4 oz (124.9 kg)  01/13/18 280 lb 3.2 oz (127.1 kg)  01/12/18 284 lb (128.8 kg)    Physical Exam  Constitutional: He is oriented to person, place, and time. He appears well-developed and well-nourished. No distress.  Eyes: Conjunctivae are normal. No scleral icterus.  Neck: Neck supple. No thyromegaly present.  Cardiovascular: Normal rate, regular rhythm, normal heart sounds and intact distal pulses.  No murmur heard. Pulmonary/Chest: Effort normal and breath sounds normal. No respiratory distress. He has no wheezes. He has no rales. He exhibits no tenderness.  Musculoskeletal: Normal range of motion. He exhibits no edema.  Lymphadenopathy:    He has no cervical adenopathy.  Neurological: He is alert and oriented to person, place, and time. Coordination normal.  Skin: Skin is warm and dry. No rash noted. He is not diaphoretic.  Psychiatric: He has a normal mood and affect. His behavior is normal.  Nursing note and vitals  reviewed.   Results for orders placed or performed during the hospital encounter of 51/88/41  Basic metabolic panel  Result Value Ref Range   Sodium 138 135 - 145 mmol/L   Potassium 4.1 3.5 - 5.1 mmol/L   Chloride 107 98 - 111 mmol/L   CO2 24 22 - 32 mmol/L   Glucose, Bld 116 (H) 70 - 99 mg/dL   BUN 18 8 - 23 mg/dL   Creatinine, Ser 1.35 (H) 0.61 - 1.24 mg/dL   Calcium 8.7 (L) 8.9 - 10.3 mg/dL   GFR calc non Af Amer 52 (  L) >60 mL/min   GFR calc Af Amer >60 >60 mL/min   Anion gap 7 5 - 15  CBC  Result Value Ref Range   WBC 10.1 4.0 - 10.5 K/uL   RBC 4.09 (L) 4.22 - 5.81 MIL/uL   Hemoglobin 10.8 (L) 13.0 - 17.0 g/dL   HCT 35.3 (L) 39.0 - 52.0 %   MCV 86.3 80.0 - 100.0 fL   MCH 26.4 26.0 - 34.0 pg   MCHC 30.6 30.0 - 36.0 g/dL   RDW 15.0 11.5 - 15.5 %   Platelets 260 150 - 400 K/uL   nRBC 0.0 0.0 - 0.2 %  Troponin I - ONCE - STAT  Result Value Ref Range   Troponin I <0.03 <0.03 ng/mL  Brain natriuretic peptide  Result Value Ref Range   B Natriuretic Peptide 330.0 (H) 0.0 - 100.0 pg/mL  Basic metabolic panel  Result Value Ref Range   Sodium 136 135 - 145 mmol/L   Potassium 4.1 3.5 - 5.1 mmol/L   Chloride 101 98 - 111 mmol/L   CO2 24 22 - 32 mmol/L   Glucose, Bld 369 (H) 70 - 99 mg/dL   BUN 23 8 - 23 mg/dL   Creatinine, Ser 1.50 (H) 0.61 - 1.24 mg/dL   Calcium 8.8 (L) 8.9 - 10.3 mg/dL   GFR calc non Af Amer 46 (L) >60 mL/min   GFR calc Af Amer 54 (L) >60 mL/min   Anion gap 11 5 - 15  Glucose, capillary  Result Value Ref Range   Glucose-Capillary 192 (H) 70 - 99 mg/dL   Comment 1 Notify RN    Comment 2 Document in Chart   Glucose, capillary  Result Value Ref Range   Glucose-Capillary 281 (H) 70 - 99 mg/dL   Comment 1 Notify RN   Glucose, capillary  Result Value Ref Range   Glucose-Capillary 206 (H) 70 - 99 mg/dL  Glucose, capillary  Result Value Ref Range   Glucose-Capillary 310 (H) 70 - 99 mg/dL  Basic metabolic panel  Result Value Ref Range   Sodium 135 135  - 145 mmol/L   Potassium 3.9 3.5 - 5.1 mmol/L   Chloride 101 98 - 111 mmol/L   CO2 24 22 - 32 mmol/L   Glucose, Bld 205 (H) 70 - 99 mg/dL   BUN 31 (H) 8 - 23 mg/dL   Creatinine, Ser 1.50 (H) 0.61 - 1.24 mg/dL   Calcium 8.7 (L) 8.9 - 10.3 mg/dL   GFR calc non Af Amer 46 (L) >60 mL/min   GFR calc Af Amer 54 (L) >60 mL/min   Anion gap 10 5 - 15  Glucose, capillary  Result Value Ref Range   Glucose-Capillary 251 (H) 70 - 99 mg/dL   Comment 1 Notify RN   Glucose, capillary  Result Value Ref Range   Glucose-Capillary 214 (H) 70 - 99 mg/dL  Glucose, capillary  Result Value Ref Range   Glucose-Capillary 327 (H) 70 - 99 mg/dL  Glucose, capillary  Result Value Ref Range   Glucose-Capillary 166 (H) 70 - 99 mg/dL  Basic metabolic panel  Result Value Ref Range   Sodium 136 135 - 145 mmol/L   Potassium 4.0 3.5 - 5.1 mmol/L   Chloride 101 98 - 111 mmol/L   CO2 24 22 - 32 mmol/L   Glucose, Bld 300 (H) 70 - 99 mg/dL   BUN 40 (H) 8 - 23 mg/dL   Creatinine, Ser 1.62 (H) 0.61 - 1.24 mg/dL  Calcium 8.5 (L) 8.9 - 10.3 mg/dL   GFR calc non Af Amer 42 (L) >60 mL/min   GFR calc Af Amer 49 (L) >60 mL/min   Anion gap 11 5 - 15  Glucose, capillary  Result Value Ref Range   Glucose-Capillary 199 (H) 70 - 99 mg/dL   Comment 1 Notify RN    Comment 2 Document in Chart   Glucose, capillary  Result Value Ref Range   Glucose-Capillary 277 (H) 70 - 99 mg/dL   Comment 1 Notify RN    Comment 2 Document in Chart   Glucose, capillary  Result Value Ref Range   Glucose-Capillary 281 (H) 70 - 99 mg/dL   Comment 1 Notify RN    Comment 2 Document in Chart   Glucose, capillary  Result Value Ref Range   Glucose-Capillary 260 (H) 70 - 99 mg/dL   Comment 1 Notify RN    Comment 2 Document in Chart   Basic metabolic panel  Result Value Ref Range   Sodium 135 135 - 145 mmol/L   Potassium 4.0 3.5 - 5.1 mmol/L   Chloride 99 98 - 111 mmol/L   CO2 27 22 - 32 mmol/L   Glucose, Bld 217 (H) 70 - 99 mg/dL   BUN  40 (H) 8 - 23 mg/dL   Creatinine, Ser 1.47 (H) 0.61 - 1.24 mg/dL   Calcium 8.3 (L) 8.9 - 10.3 mg/dL   GFR calc non Af Amer 47 (L) >60 mL/min   GFR calc Af Amer 55 (L) >60 mL/min   Anion gap 9 5 - 15  Glucose, capillary  Result Value Ref Range   Glucose-Capillary 209 (H) 70 - 99 mg/dL   Comment 1 Notify RN    Comment 2 Document in Chart   Glucose, capillary  Result Value Ref Range   Glucose-Capillary 192 (H) 70 - 99 mg/dL   Comment 1 Notify RN    Comment 2 Document in Chart   Glucose, capillary  Result Value Ref Range   Glucose-Capillary 268 (H) 70 - 99 mg/dL   Comment 1 Notify RN    Comment 2 Document in Chart   Glucose, capillary  Result Value Ref Range   Glucose-Capillary 377 (H) 70 - 99 mg/dL   Comment 1 Notify RN    Comment 2 Document in Chart   Basic metabolic panel  Result Value Ref Range   Sodium 140 135 - 145 mmol/L   Potassium 4.3 3.5 - 5.1 mmol/L   Chloride 101 98 - 111 mmol/L   CO2 32 22 - 32 mmol/L   Glucose, Bld 143 (H) 70 - 99 mg/dL   BUN 38 (H) 8 - 23 mg/dL   Creatinine, Ser 1.51 (H) 0.61 - 1.24 mg/dL   Calcium 8.4 (L) 8.9 - 10.3 mg/dL   GFR calc non Af Amer 46 (L) >60 mL/min   GFR calc Af Amer 53 (L) >60 mL/min   Anion gap 7 5 - 15  Glucose, capillary  Result Value Ref Range   Glucose-Capillary 287 (H) 70 - 99 mg/dL   Comment 1 Notify RN    Comment 2 Document in Chart   Glucose, capillary  Result Value Ref Range   Glucose-Capillary 121 (H) 70 - 99 mg/dL   Comment 1 Notify RN   Glucose, capillary  Result Value Ref Range   Glucose-Capillary 244 (H) 70 - 99 mg/dL  ECHOCARDIOGRAM COMPLETE  Result Value Ref Range   Weight 4,419.78 oz   Height 67 in  BP 150/94 mmHg      Assessment & Plan:   Problem List Items Addressed This Visit      Cardiovascular and Mediastinum   CHF (congestive heart failure) ([place]) - Primary      Patient has 2 prescriptions of potassium 10 mEq twice daily and 20 once daily, I told him he could take either of those  because it was still 20 mEq daily  Patient has been started on a few different new blood pressure medications including Imdur and metoprolol by cardiology, continue these, he is currently holding his telmisartan and since his blood pressure today is down we will continue to hold his telmisartan for now   Follow up plan: Return in about 4 weeks (around 02/09/2018), or if symptoms worsen or fail to improve, for CHF follow-up.  Counseling provided for all of the vaccine components No orders of the defined types were placed in this encounter.   Caryl Pina, MD [facility] 01/18/2018, 9:02 PM

## 2018-01-12 NOTE — Patient Outreach (Signed)
[place] [facility]) Care Management  [place]  01/12/2018  Peighton Edgin Mcclatchey [birth year] [phone]  Reason for referral: 30 day post discharge medication review  Successful telephone call attempt #1 to Mr. Neely.  He states he just saw his PCP this morning and was tired.  He requested that I call him back next week to complete the medication review.  Plan:  I will make another outreach attempt to patient within 3-4 business days.  [name], PharmD Clinical Pharmacist [place] [phone]

## 2018-01-13 ENCOUNTER — Encounter: Payer: Self-pay | Admitting: Cardiovascular Disease

## 2018-01-13 ENCOUNTER — Ambulatory Visit: Payer: PPO | Admitting: Family Medicine

## 2018-01-13 ENCOUNTER — Ambulatory Visit: Payer: PPO | Admitting: Cardiovascular Disease

## 2018-01-13 VITALS — BP 124/77 | HR 71 | Ht 67.0 in | Wt 280.2 lb

## 2018-01-13 DIAGNOSIS — I5032 Chronic diastolic (congestive) heart failure: Secondary | ICD-10-CM | POA: Diagnosis not present

## 2018-01-13 DIAGNOSIS — E785 Hyperlipidemia, unspecified: Secondary | ICD-10-CM | POA: Diagnosis not present

## 2018-01-13 DIAGNOSIS — I2510 Atherosclerotic heart disease of native coronary artery without angina pectoris: Secondary | ICD-10-CM

## 2018-01-13 DIAGNOSIS — E669 Obesity, unspecified: Secondary | ICD-10-CM

## 2018-01-13 DIAGNOSIS — J441 Chronic obstructive pulmonary disease with (acute) exacerbation: Secondary | ICD-10-CM | POA: Diagnosis not present

## 2018-01-13 DIAGNOSIS — N183 Chronic kidney disease, stage 3 unspecified: Secondary | ICD-10-CM

## 2018-01-13 DIAGNOSIS — G4733 Obstructive sleep apnea (adult) (pediatric): Secondary | ICD-10-CM

## 2018-01-13 DIAGNOSIS — I10 Essential (primary) hypertension: Secondary | ICD-10-CM

## 2018-01-13 DIAGNOSIS — E1169 Type 2 diabetes mellitus with other specified complication: Secondary | ICD-10-CM | POA: Diagnosis not present

## 2018-01-13 MED ORDER — HYDRALAZINE HCL 25 MG PO TABS: 25.0000 mg | ORAL_TABLET | Freq: Three times a day (TID) | ORAL | 3 refills | Status: DC

## 2018-01-13 MED ORDER — ISOSORBIDE DINITRATE 30 MG PO TABS: 30.0000 mg | ORAL_TABLET | Freq: Three times a day (TID) | ORAL | 3 refills | Status: DC

## 2018-01-13 MED ORDER — FUROSEMIDE 40 MG PO TABS: ORAL_TABLET | ORAL | 3 refills | Status: DC

## 2018-01-13 MED ORDER — BISOPROLOL FUMARATE 5 MG PO TABS: 2.5000 mg | ORAL_TABLET | Freq: Every day | ORAL | 3 refills | Status: DC

## 2018-01-13 NOTE — Patient Instructions (Signed)
Medication Instructions:  Dr Sallyanne Kuster has recommended making the following medication changes: 1. INCREASE Furosemide to 80 mg (2 tablets) in the morning and 40 mg (1 tablet) in the afternoon 2. INCREASE Hydralazine to 25 mg THREE times daily 3. INCREASE Isosorbide to 30 mg THREE times daily 4. STOP Metoprolol 5.START Bisoprolol 2.5 mg (0.5 tablet) daily  If you need a refill on your cardiac medications before your next appointment, please call your pharmacy.   Lab work: Your physician recommends that you return for lab work prior to your appointment with Almyra Deforest, PA.  If you have labs (blood work) drawn today and your tests are completely normal, you will receive your results only by: [name] MyChart Message (if you have MyChart) OR . A paper copy in the mail If you have any lab test that is abnormal or we need to change your treatment, we will call you to review the results.  Follow-Up: Your physician recommends that you schedule a follow-up appointment in 1 week with Almyra Deforest, PA.   Your physician recommends that you weigh, daily, at the same time every day, and in the same amount of clothing. Please record your daily weights on the handout provided and bring it to your next appointment.

## 2018-01-13 NOTE — Progress Notes (Signed)
Cardiology Office Note    Date:  01/13/2018   ID:  [name], DOB 1945-07-30, MRN [id number]  PCP:  [name], [name], MD  Cardiologist:   Sanda Klein, MD   Chief Complaint  Patient presents with  . Shortness of Breath    History of Present Illness:  Kenneth Fuller is a 72 y.o. male with chronic diastolic heart failure, morbid obesity, obstructive sleep apnea, hyperlipidemia, diabetes mellitus complicated by neuropathy, minor nonobstructive coronary atherosclerosis, moderately severe restrictive lung disease.   He is short of breath.  He appears to report orthopnea.  He sleeps in a recliner.  He had wheezing earlier in November, but this is no longer present.  He frequently coughs, without producing sputum or hemoptysis.  He denies chest pain, either pleuritic or anginal.  He has not had fever or chills.  He has not had edema.  He reports that his weight at home is steady at around 275 pounds, which is what he weighed today (5 pounds less than our office scale).  He recalls weighing 286 pounds on his home scale when he first got home from the hospital.  Since returning home he has had difficulty using the CPAP.  That makes him feel worse.  He was recently hospitalized for severe shortness of breath from November 27 through December 2.  Dyspnea onset was insidious.  It was accompanied by coughing and wheezing.  Chronic mild leg swelling was described as unchanged.  On admission his BNP was slightly elevated at 330 (previous baseline 72.6 in 2017).  His chest x-ray showed "borderline to mild cardiomegaly with small pleural effusion, vascular congestion and mild interstitial edema".  The ECG rhythm was sinus tachycardia.  An echo during that hospitalization which was a mediocre quality study but showed an ejection fraction decreased at 45-50%, lower than previously reported.  Data was missing to permit full evaluation of diastolic function, but the A-wave dominant mitral inflow  suggested that he was "not wet".  Admission weight was not recorded, but the next day on November 28 he weighed 125.3 kg (276.6 pounds).  He was treated with intravenous steroids, nebulized bronchodilators, antibiotics as well as loop diuretics and oxygen supplementation.  Cardiology was not consulted.  Discharge weight is documented at 127.4 kg (281.2 pounds).  He reports "peeing a whole lot".  But there is no formal record of his in/out available for review.  The patient was prescribed an angiotensin receptor blocker prior to this admission, but this was held at discharge due to elevation in creatinine level.  Noted that he has a history of previous angioedema when taking Lotrel (amlodipine-benazepril).  He was discharged on hydralazine and long-acting nitrates in a twice daily regimen.  His beta-blocker, bisoprolol was held several months ago by Dr. Warrick Parisian, due to concerns about frequent respiratory exacerbations.  He was prescribed metoprolol at discharge from this last hospitalization.  He underwent cardiac catheterization in October 2018 he has preserved left ventricular systolic function and minimal coronary artery disease.  LVEDP was borderline at the time of catheterization (10-14 mmHg).  His echo did show grade 2 diastolic dysfunction.  However pulmonary function test had shown fairly severe obstructive lung disease with FEV1 in the 40-45% of predicted range.  He did not have much improvement with bronchodilators either on the PFTs or clinically.  He does have a history of roughly 5 pack years of smoking, but quit 40 years ago.  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  Chest CT did not show evidence of severe interstitial lung disease.    Past Medical History:  Diagnosis Date  . Allergy   . Asthma   . CAD (coronary artery disease)   . Diabetes mellitus   . Fibromyalgia   . GERD (gastroesophageal reflux disease)   . GI bleeding   . Gout   .  Hyperlipidemia   . Hypertension   . Hypogonadism male   . MRSA cellulitis   . Neuropathy   . Obesity   . Shortness of breath dyspnea    with exertion   . Sleep apnea    cpap- 14   . Wheezing    no asthma diagnosis    Past Surgical History:  Procedure Laterality Date  . BACK SURGERY    . CARDIAC CATHETERIZATION    . COLONOSCOPY    . LEFT HEART CATH AND CORONARY ANGIOGRAPHY N/A 12/02/2016   Procedure: LEFT HEART CATH AND CORONARY ANGIOGRAPHY;  Surgeon: Jettie Booze, MD;  Location: [facility];  Service: Cardiovascular;  Laterality: N/A;  . LUMBAR LAMINECTOMY/DECOMPRESSION MICRODISCECTOMY N/A 01/02/2014   Procedure: CENTRAL DECOMPRESSION LUMBAR LAMINECTOMY L3-L4, L4-L5;  Surgeon: Tobi Bastos, MD;  Location: [facility];  Service: Orthopedics;  Laterality: N/A;  . neck fusion      Current Medications: Outpatient Medications Prior to Visit  Medication Sig Dispense Refill  . albuterol (PROVENTIL HFA;VENTOLIN HFA) 108 (90 Base) MCG/ACT inhaler Inhale 2 puffs into the lungs every 6 (six) hours as needed for wheezing or shortness of breath. 1 Inhaler 2  . atorvastatin (LIPITOR) 40 MG tablet TAKE 1 TABLET BY MOUTH ONCE DAILY (Patient taking differently: Take 40 mg by mouth every evening. ) 90 tablet 0  . Blood Glucose Monitoring Suppl (PRODIGY BLOOD GLUCOSE MONITOR) W/DEVICE KIT 1 Units by Does not apply route daily. Check blood sugars 1X per day-- dx 250.02 1 each 0  . budesonide-formoterol (SYMBICORT) 160-4.5 MCG/ACT inhaler Inhale 2 puffs into the lungs 2 (two) times daily. 1 Inhaler 3  . buPROPion (ZYBAN) 150 MG 12 hr tablet Take 150 mg by mouth daily.    . Cholecalciferol (VITAMIN D PO) Take 5,000 Units by mouth every morning.     . clonazePAM (KLONOPIN) 0.5 MG tablet Take 1 tablet (0.5 mg total) by mouth at bedtime as needed for anxiety. (Patient taking differently: Take 0.5 mg by mouth at bedtime. ) 25 tablet 1  . glimepiride (AMARYL) 4 MG tablet Take 1 tablet (4 mg  total) by mouth daily with breakfast. (Patient taking differently: Take 2 mg by mouth every morning. ) 90 tablet 1  . glucose blood test strip Test 1X per day and prn One Touch Ultra test strips 100 each 2  . metFORMIN (GLUCOPHAGE) 1000 MG tablet TAKE 1 TABLET BY MOUTH TWICE DAILY WITH MEALS (Patient taking differently: Take 1,000 mg by mouth 2 (two) times daily with a meal. ) 180 tablet 0  . omeprazole (PRILOSEC) 40 MG capsule TAKE 1 CAPSULE BY MOUTH ONCE DAILY (Patient taking differently: Take 40 mg by mouth every morning. ) 90 capsule 1  . ONETOUCH DELICA LANCETS 84O MISC 1 each by Does not apply route daily. Test 1X per day and prn 100 each 2  . OXYGEN Inhale 2 L into the lungs at bedtime. 2lpm with sleep     . Polyethyl Glycol-Propyl Glycol (SYSTANE OP) Apply 1 drop to eye daily as needed (for dry eye relief).     [name]  potassium chloride 20 MEQ TBCR Take 20 mEq by mouth daily. 30 tablet 1  . predniSONE (DELTASONE) 20 MG tablet Take 3 tablets by mouth daily x1 day; then 2 tablets by mouth daily x2 days; then 1 tablet by mouth daily x2 days; then 0.5 tablet by mouth daily x3 days and stop prednisone. 12 tablet 0  . sertraline (ZOLOFT) 50 MG tablet Take 50 mg by mouth every morning.  1  . telmisartan (MICARDIS) 40 MG tablet Take 1 tablet (40 mg total) by mouth daily. Hold this medication until follow-up with cardiology service.    . furosemide (LASIX) 40 MG tablet Take 1 tablet (40 mg total) by mouth 2 (two) times daily. 60 tablet 1  . hydrALAZINE (APRESOLINE) 25 MG tablet Take 1 tablet (25 mg total) by mouth 2 (two) times daily. 60 tablet 1  . isosorbide dinitrate (ISORDIL) 30 MG tablet Take 1 tablet (30 mg total) by mouth 2 (two) times daily. 60 tablet 1  . metoprolol tartrate (LOPRESSOR) 25 MG tablet Take 0.5 tablets (12.5 mg total) by mouth 2 (two) times daily. 60 tablet 3   No facility-administered medications prior to visit.      Allergies:   Amlodipine besy-benazepril hcl; Oxycodone;  Phenergan [promethazine hcl]; and Hydrocodone   Social History   Socioeconomic History  . Marital status: Married    Spouse name: Not on file  . Number of children: 3  . Years of education: Not on file  . Highest education level: Not on file  Occupational History  . Occupation: retired/disability 1999    Employer: DISABLED    Comment: Textiles  Social Needs  . Financial resource strain: Not on file  . Food insecurity:    Worry: Not on file    Inability: Not on file  . Transportation needs:    Medical: Not on file    Non-medical: Not on file  Tobacco Use  . Smoking status: Former Smoker    Last attempt to quit: 02/08/1969    Years since quitting: 48.9  . Smokeless tobacco: Former Network engineer and Sexual Activity  . Alcohol use: No    Alcohol/week: 0.0 standard drinks  . Drug use: No  . Sexual activity: Not on file  Lifestyle  . Physical activity:    Days per week: Not on file    Minutes per session: Not on file  . Stress: Not on file  Relationships  . Social connections:    Talks on phone: Not on file    Gets together: Not on file    Attends religious service: Not on file    Active member of club or organization: Not on file    Attends meetings of clubs or organizations: Not on file    Relationship status: Not on file  Other Topics Concern  . Not on file  Social History Narrative   Drinks caffeine tea occasionally      Family History:  The patient's family history includes Colon cancer in his mother; Deep vein thrombosis in his brother; Diabetes in his father; Drug abuse in his sister; Heart attack in his father; Heart attack (age of onset: 60) in his son; Heart disease in his brother, brother, and father; Heart failure in his sister; Kidney disease in his sister.   ROS:   Please see the history of present illness.    ROS All other systems reviewed and are negative.   PHYSICAL EXAM:   VS:  BP 124/77   Pulse 71  Ht _0  (1.702 m)   Wt 280 lb 3.2 oz  (127.1 kg)   BMI 43.89 kg/m     General: Alert, oriented x3, no distress, morbid obesity Head: no evidence of trauma, PERRL, EOMI, no exophtalmos or lid lag, no myxedema, no xanthelasma; normal ears, nose and oropharynx Neck: normal jugular venous pulsations and no hepatojugular reflux; brisk carotid pulses without delay and no carotid bruits Chest: clear to auscultation, no signs of consolidation by percussion or palpation, normal fremitus, symmetrical and full respiratory excursions Cardiovascular: normal position and quality of the apical impulse, regular rhythm, normal first and second heart sounds, no murmurs, rubs or gallops Abdomen: no tenderness or distention, no masses by palpation, no abnormal pulsatility or arterial bruits, normal bowel sounds, no hepatosplenomegaly Extremities: no clubbing, cyanosis or edema; 2+ radial, ulnar and brachial pulses bilaterally; 2+ right femoral, posterior tibial and dorsalis pedis pulses; 2+ left femoral, posterior tibial and dorsalis pedis pulses; no subclavian or femoral bruits Neurological: grossly nonfocal Psych: Normal mood and affect    Wt Readings from Last 3 Encounters:  01/13/18 280 lb 3.2 oz (127.1 kg)  01/12/18 284 lb (128.8 kg)  01/09/18 280 lb 14.4 oz (127.4 kg)      Studies/Labs Reviewed:   EKG:  EKG is not ordered today. 01/04/2018 tracing shows mild sinus tachycardia, rightward axis, no repolarization abnormalities. Recent Labs: 02/25/2017: Pro B Natriuretic peptide (BNP) 97.0; TSH 3.30 11/11/2017: ALT 28 01/04/2018: B Natriuretic Peptide 330.0; Hemoglobin 10.8; Platelets 260 01/09/2018: BUN 38; Creatinine, Ser 1.51; Potassium 4.3; Sodium 140   Lipid Panel    Component Value Date/Time   CHOL 89 (L) 11/29/2016 0800   CHOL 111 06/12/2012 1232   TRIG 113 11/29/2016 0800   TRIG 141 01/24/2014 1143   TRIG 192 (H) 06/12/2012 1232   HDL 36 (L) 11/29/2016 0800   HDL 38 (L) 01/24/2014 1143   HDL 33 (L) 06/12/2012 1232   CHOLHDL  2.5 11/29/2016 0800   CHOLHDL 4.8 11/22/2006 1712   VLDL 39 11/22/2006 1712   LDLCALC 30 11/29/2016 0800   LDLCALC 27 10/25/2013 1224   LDLCALC 40 06/12/2012 1232    Labs from 11/29/2016 Total cholesterol 89, HDL 36, LDL 30, triglycerides 113, normal liver function tests, hemoglobin 13.7 02/25/2017 Creatinine 1.12, potassium 4.2, BNP 97, TSH 3.3 03/15/2017 Hemoglobin A1c 7.6%  ASSESSMENT:    1. Chronic diastolic heart failure ([place])   2. Chronic obstructive pulmonary disease with acute exacerbation ([place])   3. OSA (obstructive sleep apnea)   4. Essential hypertension   5. Atherosclerosis of native coronary artery of native heart without angina pectoris   6. Dyslipidemia (high LDL; low HDL)   7. Diabetes mellitus type 2 in obese ([place])   8. CKD (chronic kidney disease) stage 3, GFR 30-59 ml/min ([facility])   9. Morbid obesity ([place])      PLAN:  In order of problems listed above:  1. CHF: Very puzzling situation.  Hard to decide what degree his dyspnea was due to cardiac illness.  His BNP was higher than previously reported baseline and his chest x-ray showed "mild interstitial edema".  On the other hand his weight is discharge was higher than his admission weight and despite this he felt better at discharge.  His weight today is average for the range throughout the last 12 months.  His physical exam is very challenging due to morbid obesity, but I really do not find any overt signs of hypervolemia.  His echo was an incomplete  study, but did suggest that there is been a decrease in left ventricular systolic function (he did not have meaningful coronary disease on cardiac catheterization performed just a year ago).  We will increase his diuretic to 80 mg in the morning and 40 mg in the afternoon, increase the hydralazine/nitrates to a 3 times daily prescription and bring him back in a week with his weight log, repeat BNP and metabolic panel.  We might have to resort to a right heart  catheterization to clarify the cause for his dyspnea. 2. COPD: He had markedly abnormal PFTs in the past which I thought were responsible for his chronic dyspnea.  If his EF is low he would indeed benefit from a beta-blocker and will switch back to bisoprolol rather than metoprolol for its better cardiac selectivity.  It may be worthwhile to have him follow-up again with his pulmonary specialist. 3. OSA: Compliant with the device in the recent years.  Cannot tolerate it right now.  We have never been able to evaluate his pulmonary artery pressure by echo. 4. HTN:  well-controlled 5. CAD: Repeat coronary angiography in October 2018 showed only minor nonobstructive atherosclerosis, no progression from 2011 6. HLP: Very good LDL on lipid profile, chronically low HDL related to obesity. 7. DM: Earlier in the year his glycemic control was slightly inadequate with an A1c of 7.6%, but in October his hemoglobin A1c had decreased to 6.0%.  Currently his blood sugars are elevated due to treatment with steroids. 8. CKD: Admission creatinine was 1.35, discharge creatinine was 1.51, GFR around 50.  In 2017 his creatinine was less than 1.0, with a lowest creatinine in the last 12 months was 1.25 in April 9. Morbid obesity is clearly a big part of his problems with shortness of breath.  It definitely makes it hard to assess his volume status.    Medication Adjustments/Labs and Tests Ordered: Current medicines are reviewed at length with the patient today.  Concerns regarding medicines are outlined above.  Medication changes, Labs and Tests ordered today are listed in the Patient Instructions below. Patient Instructions  Medication Instructions:  Dr Sallyanne Kuster has recommended making the following medication changes: 1. INCREASE Furosemide to 80 mg (2 tablets) in the morning and 40 mg (1 tablet) in the afternoon 2. INCREASE Hydralazine to 25 mg THREE times daily 3. INCREASE Isosorbide to 30 mg THREE times daily 4.  STOP Metoprolol 5.START Bisoprolol 2.5 mg (0.5 tablet) daily  If you need a refill on your cardiac medications before your next appointment, please call your pharmacy.   Lab work: Your physician recommends that you return for lab work prior to your appointment with Almyra Deforest, PA.  If you have labs (blood work) drawn today and your tests are completely normal, you will receive your results only by: [name] MyChart Message (if you have MyChart) OR . A paper copy in the mail If you have any lab test that is abnormal or we need to change your treatment, we will call you to review the results.  Follow-Up: Your physician recommends that you schedule a follow-up appointment in 1 week with Almyra Deforest, PA.   Your physician recommends that you weigh, daily, at the same time every day, and in the same amount of clothing. Please record your daily weights on the handout provided and bring it to your next appointment.    Signed, Sanda Klein, MD  01/13/2018 3:08 PM    [place],  [place]  [address] Phone: [phone]; Fax: [phone]

## 2018-01-16 ENCOUNTER — Other Ambulatory Visit: Payer: PPO

## 2018-01-16 ENCOUNTER — Other Ambulatory Visit: Payer: Self-pay

## 2018-01-16 ENCOUNTER — Other Ambulatory Visit: Payer: Self-pay | Admitting: Family Medicine

## 2018-01-16 ENCOUNTER — Ambulatory Visit: Payer: Self-pay | Admitting: Pharmacy Technician

## 2018-01-16 DIAGNOSIS — R0600 Dyspnea, unspecified: Secondary | ICD-10-CM | POA: Diagnosis not present

## 2018-01-16 DIAGNOSIS — R0902 Hypoxemia: Secondary | ICD-10-CM | POA: Diagnosis not present

## 2018-01-16 DIAGNOSIS — I5032 Chronic diastolic (congestive) heart failure: Secondary | ICD-10-CM | POA: Diagnosis not present

## 2018-01-16 MED ORDER — PRODIGY BLOOD GLUCOSE MONITOR W/DEVICE KIT: 1.0000 [IU] | PACK | Freq: Every day | 0 refills | Status: DC

## 2018-01-17 ENCOUNTER — Other Ambulatory Visit: Payer: Self-pay | Admitting: Family Medicine

## 2018-01-17 ENCOUNTER — Encounter: Payer: Self-pay | Admitting: Physician Assistant

## 2018-01-17 ENCOUNTER — Other Ambulatory Visit: Payer: Self-pay

## 2018-01-17 ENCOUNTER — Ambulatory Visit (INDEPENDENT_AMBULATORY_CARE_PROVIDER_SITE_OTHER): Payer: PPO | Admitting: Physician Assistant

## 2018-01-17 ENCOUNTER — Other Ambulatory Visit: Payer: Self-pay | Admitting: *Deleted

## 2018-01-17 ENCOUNTER — Other Ambulatory Visit: Payer: PPO

## 2018-01-17 VITALS — BP 130/82 | HR 74 | Ht 67.0 in | Wt 275.4 lb

## 2018-01-17 DIAGNOSIS — R809 Proteinuria, unspecified: Secondary | ICD-10-CM | POA: Diagnosis not present

## 2018-01-17 DIAGNOSIS — E1129 Type 2 diabetes mellitus with other diabetic kidney complication: Secondary | ICD-10-CM | POA: Diagnosis not present

## 2018-01-17 DIAGNOSIS — N183 Chronic kidney disease, stage 3 (moderate): Secondary | ICD-10-CM | POA: Diagnosis not present

## 2018-01-17 DIAGNOSIS — E1142 Type 2 diabetes mellitus with diabetic polyneuropathy: Secondary | ICD-10-CM

## 2018-01-17 DIAGNOSIS — J984 Other disorders of lung: Secondary | ICD-10-CM

## 2018-01-17 DIAGNOSIS — E785 Hyperlipidemia, unspecified: Secondary | ICD-10-CM

## 2018-01-17 DIAGNOSIS — E119 Type 2 diabetes mellitus without complications: Secondary | ICD-10-CM

## 2018-01-17 DIAGNOSIS — D509 Iron deficiency anemia, unspecified: Secondary | ICD-10-CM | POA: Diagnosis not present

## 2018-01-17 DIAGNOSIS — I5032 Chronic diastolic (congestive) heart failure: Secondary | ICD-10-CM

## 2018-01-17 DIAGNOSIS — I2510 Atherosclerotic heart disease of native coronary artery without angina pectoris: Secondary | ICD-10-CM | POA: Diagnosis not present

## 2018-01-17 DIAGNOSIS — I509 Heart failure, unspecified: Secondary | ICD-10-CM | POA: Diagnosis not present

## 2018-01-17 DIAGNOSIS — I10 Essential (primary) hypertension: Secondary | ICD-10-CM | POA: Diagnosis not present

## 2018-01-17 DIAGNOSIS — E876 Hypokalemia: Secondary | ICD-10-CM | POA: Diagnosis not present

## 2018-01-17 DIAGNOSIS — Z79899 Other long term (current) drug therapy: Secondary | ICD-10-CM | POA: Diagnosis not present

## 2018-01-17 DIAGNOSIS — G4733 Obstructive sleep apnea (adult) (pediatric): Secondary | ICD-10-CM | POA: Diagnosis not present

## 2018-01-17 LAB — PRO B NATRIURETIC PEPTIDE: NT-Pro BNP: 652 pg/mL — ABNORMAL HIGH (ref 0–376)

## 2018-01-17 LAB — BASIC METABOLIC PANEL
BUN/Creatinine Ratio: 18 (ref 10–24)
BUN: 21 mg/dL (ref 8–27)
CO2: 25 mmol/L (ref 20–29)
Calcium: 9.6 mg/dL (ref 8.6–10.2)
Chloride: 96 mmol/L (ref 96–106)
Creatinine, Ser: 1.14 mg/dL (ref 0.76–1.27)
GFR calc Af Amer: 74 mL/min/{1.73_m2} (ref >59)
GFR calc non Af Amer: 64 mL/min/{1.73_m2} (ref >59)
Glucose: 199 mg/dL — ABNORMAL HIGH (ref 65–99)
Potassium: 4.8 mmol/L (ref 3.5–5.2)
Sodium: 138 mmol/L (ref 134–144)

## 2018-01-17 LAB — SPECIMEN STATUS REPORT

## 2018-01-17 MED ORDER — POTASSIUM CHLORIDE ER 20 MEQ PO TBCR: 20.0000 meq | EXTENDED_RELEASE_TABLET | Freq: Two times a day (BID) | ORAL | 1 refills | Status: DC

## 2018-01-17 MED ORDER — FUROSEMIDE 40 MG PO TABS: ORAL_TABLET | ORAL | 3 refills | Status: DC

## 2018-01-17 NOTE — Progress Notes (Signed)
Cardiology Office Note    Date:  01/20/2018   ID:  Kenneth Fuller, DOB 04-20-1945, MRN [id number]  PCP:  [name], [name], MD  Cardiologist: Dr. Sallyanne Fuller  Chief Complaint  Patient presents with  . Follow-up    seen for Dr. Sallyanne Fuller.     History of Present Illness:  Kenneth Fuller is a 72 y.o. male with PMH of chronic diastolic heart failure, morbid obesity, obstructive sleep apnea, hyperlipidemia, DM 2 with diabetic neuropathy, history of nonobstructive coronary artery disease and moderate severe restrictive lung disease.  He had a cardiac catheterization in October 2018 which showed minimal coronary artery disease.  LVEDP was borderline.  Echocardiogram showed grade 2 DD.  Pulmonary function test showed fairly severe obstructive disease but FEV1 in the 40 to 45% of predicted range.  He did not have significant improvement with bronchodilator.  He used to work in a [employer] in [place]. Patient was recently admitted in November with severe shortness of breath with coughing and wheezing.  BNP was mildly elevated at 330.  Chest x-ray reported borderline to mild cardiomegaly with a small pleural effusion, vascular congestion and interstitial edema.  Echocardiogram showed EF 45 to 50%, which is lower than previously reported.  He was treated with IV steroid, nebulized bronchodilator, antibiotic, loop diuretic and oxygen supplementation.  Discharge weight was 281.2 pounds.  His diuretic was increased to 80 mg in the a.m. and 40 mg in p.m.  Hydralazine/nitrate was increased to 3 times daily.  [name], renal function is actually quite stable on the current therapy.  He continues to have mild lower extremity edema on the current therapy, I wish to do a trial on increased dose of diuretic, I plan to increase Lasix to 80 mg twice daily.  He will need basic metabolic panel in 1 week.  I plan to bring the patient back in 2 weeks for reassessment.  Otherwise he  denies any chest pain, orthopnea or PND.  Past Medical History:  Diagnosis Date  . Allergy   . Asthma   . CAD (coronary artery disease)   . Diabetes mellitus   . Fibromyalgia   . GERD (gastroesophageal reflux disease)   . GI bleeding   . Gout   . Hyperlipidemia   . Hypertension   . Hypogonadism male   . MRSA cellulitis   . Neuropathy   . Obesity   . Shortness of breath dyspnea    with exertion   . Sleep apnea    cpap- 14   . Wheezing    no asthma diagnosis    Past Surgical History:  Procedure Laterality Date  . BACK SURGERY    . CARDIAC CATHETERIZATION    . COLONOSCOPY    . LEFT HEART CATH AND CORONARY ANGIOGRAPHY N/A 12/02/2016   Procedure: LEFT HEART CATH AND CORONARY ANGIOGRAPHY;  Surgeon: Kenneth Booze, MD;  Location: [facility];  Service: Cardiovascular;  Laterality: N/A;  . LUMBAR LAMINECTOMY/DECOMPRESSION MICRODISCECTOMY N/A 01/02/2014   Procedure: CENTRAL DECOMPRESSION LUMBAR LAMINECTOMY L3-L4, L4-L5;  Surgeon: Kenneth Bastos, MD;  Location: [facility];  Service: Orthopedics;  Laterality: N/A;  . neck fusion      Current Medications: Outpatient Medications Prior to Visit  Medication Sig Dispense Refill  . albuterol (PROVENTIL HFA;VENTOLIN HFA) 108 (90 Base) MCG/ACT inhaler Inhale 2 puffs into the lungs every 6 (six) hours as needed for wheezing or shortness of breath. 1 Inhaler 2  . atorvastatin (LIPITOR)  40 MG tablet TAKE 1 TABLET BY MOUTH ONCE DAILY (Patient taking differently: Take 40 mg by mouth every evening. ) 90 tablet 0  . bisoprolol (ZEBETA) 5 MG tablet Take 0.5 tablets (2.5 mg total) by mouth daily. 45 tablet 3  . budesonide-formoterol (SYMBICORT) 160-4.5 MCG/ACT inhaler Inhale 2 puffs into the lungs 2 (two) times daily. 1 Inhaler 3  . buPROPion (WELLBUTRIN XL) 150 MG 24 hr tablet Take 150 mg by mouth daily.    . Cholecalciferol (VITAMIN D PO) Take 5,000 Units by mouth every morning.     . clonazePAM (KLONOPIN) 0.5 MG tablet Take 1 tablet  (0.5 mg total) by mouth at bedtime as needed for anxiety. (Patient taking differently: Take 0.5 mg by mouth at bedtime. ) 25 tablet 1  . hydrALAZINE (APRESOLINE) 25 MG tablet Take 1 tablet (25 mg total) by mouth 3 (three) times daily. 270 tablet 3  . isosorbide dinitrate (ISORDIL) 30 MG tablet Take 1 tablet (30 mg total) by mouth 3 (three) times daily. 270 tablet 3  . metFORMIN (GLUCOPHAGE) 1000 MG tablet TAKE 1 TABLET BY MOUTH TWICE DAILY WITH MEALS (Patient taking differently: Take 1,000 mg by mouth 2 (two) times daily with a meal. ) 180 tablet 0  . omeprazole (PRILOSEC) 40 MG capsule TAKE 1 CAPSULE BY MOUTH ONCE DAILY (Patient taking differently: Take 40 mg by mouth every morning. ) 90 capsule 1  . ONETOUCH DELICA LANCETS 90W MISC 1 each by Does not apply route daily. Test 1X per day and prn 100 each 2  . OXYGEN Inhale 2 L into the lungs at bedtime. 2lpm with sleep     . Polyethyl Glycol-Propyl Glycol (SYSTANE OP) Apply 1 drop to eye daily as needed (for dry eye relief).     [name] PRODIGY NO CODING BLOOD GLUC test strip CHECK BLOOD SUGARS ONCE DAILY 50 each 11  . sertraline (ZOLOFT) 50 MG tablet Take 50 mg by mouth every morning.  1  . furosemide (LASIX) 40 MG tablet Take 2 tablets (80 mg total) by mouth every morning AND 1 tablet (40 mg total) every evening. 270 tablet 3  . glimepiride (AMARYL) 4 MG tablet Take 1 tablet (4 mg total) by mouth daily with breakfast. 90 tablet 1  . potassium chloride 20 MEQ TBCR Take 20 mEq by mouth daily. 30 tablet 1   No facility-administered medications prior to visit.      Allergies:   Amlodipine besy-benazepril hcl; Oxycodone; Phenergan [promethazine hcl]; and Hydrocodone   Social History   Socioeconomic History  . Marital status: Married    Spouse name: Not on file  . Number of children: 3  . Years of education: Not on file  . Highest education level: Not on file  Occupational History  . Occupation: retired/disability 1999    Employer: DISABLED     Comment: Textiles  Social Needs  . Financial resource strain: Not on file  . Food insecurity:    Worry: Not on file    Inability: Not on file  . Transportation needs:    Medical: Not on file    Non-medical: Not on file  Tobacco Use  . Smoking status: Former Smoker    Last attempt to quit: 02/08/1969    Years since quitting: 48.9  . Smokeless tobacco: Former Network engineer and Sexual Activity  . Alcohol use: No    Alcohol/week: 0.0 standard drinks  . Drug use: No  . Sexual activity: Not on file  Lifestyle  . Physical  activity:    Days per week: Not on file    Minutes per session: Not on file  . Stress: Not on file  Relationships  . Social connections:    Talks on phone: Not on file    Gets together: Not on file    Attends religious service: Not on file    Active member of club or organization: Not on file    Attends meetings of clubs or organizations: Not on file    Relationship status: Not on file  Other Topics Concern  . Not on file  Social History Narrative   Drinks caffeine tea occasionally      Family History:  The patient's family history includes Colon cancer in his mother; Deep vein thrombosis in his brother; Diabetes in his father; Drug abuse in his sister; Heart attack in his father; Heart attack (age of onset: 65) in his son; Heart disease in his brother, brother, and father; Heart failure in his sister; Kidney disease in his sister.   ROS:   Please see the history of present illness.    ROS All other systems reviewed and are negative.   PHYSICAL EXAM:   VS:  BP 130/82 (BP Location: Right Arm, Patient Position: Sitting)   Pulse 74   Ht _0  (1.702 m)   Wt 275 lb 6.4 oz (124.9 kg)   BMI 43.13 kg/m    GEN: Well nourished, well developed, in no acute distress  HEENT: normal  Neck: no JVD, carotid bruits, or masses Cardiac: RRR; no murmurs, rubs, or gallops. 1+ pitting edema  Respiratory:  clear to auscultation bilaterally, normal work of breathing GI:  soft, nontender, nondistended, + BS MS: no deformity or atrophy  Skin: warm and dry, no rash Neuro:  Alert and Oriented x 3, Strength and sensation are intact Psych: euthymic mood, full affect  Wt Readings from Last 3 Encounters:  01/17/18 275 lb 6.4 oz (124.9 kg)  01/13/18 280 lb 3.2 oz (127.1 kg)  01/12/18 284 lb (128.8 kg)      Studies/Labs Reviewed:   EKG:  EKG is not ordered today.   Recent Labs: 02/25/2017: TSH 3.30 11/11/2017: ALT 28 01/04/2018: B Natriuretic Peptide 330.0; Hemoglobin 10.8; Platelets 260 01/16/2018: BUN 21; Creatinine, Ser 1.14; NT-Pro BNP 652; Potassium 4.8; Sodium 138   Lipid Panel    Component Value Date/Time   CHOL 89 (L) 11/29/2016 0800   CHOL 111 06/12/2012 1232   TRIG 113 11/29/2016 0800   TRIG 141 01/24/2014 1143   TRIG 192 (H) 06/12/2012 1232   HDL 36 (L) 11/29/2016 0800   HDL 38 (L) 01/24/2014 1143   HDL 33 (L) 06/12/2012 1232   CHOLHDL 2.5 11/29/2016 0800   CHOLHDL 4.8 11/22/2006 1712   VLDL 39 11/22/2006 1712   LDLCALC 30 11/29/2016 0800   LDLCALC 27 10/25/2013 1224   LDLCALC 40 06/12/2012 1232    Additional studies/ records that were reviewed today include:   Echo 01/05/2018 LV EF: 45% -   50% Study Conclusions  - Left ventricle: The cavity size was mildly dilated. Wall   thickness was normal. Systolic function was mildly reduced. The   estimated ejection fraction was in the range of 45% to 50%. There   was an increased relative contribution of atrial contraction to   ventricular filling. Doppler parameters are consistent with   abnormal left ventricular relaxation (grade 1 diastolic   dysfunction). Mitral annulus tissue Doppler was not recorded.   Diastolic function assessment is incomplete,  but avalable data   suggest normal mean left atrial pressure. - Mitral valve: There was mild regurgitation. - Left atrium: The atrium was moderately dilated.  Impressions:  - Poor quality apical views. The study should be repeated  with   Definity contrast.   LV systolic function appears to have worsened since the previous   study, but there is low confidence in this assessment due to   image quality.    ASSESSMENT:    1. Chronic diastolic heart failure ([place])   2. OSA (obstructive sleep apnea)   3. Hyperlipidemia, unspecified hyperlipidemia type   4. Controlled type 2 diabetes mellitus without complication, without long-term current use of insulin ([place])   5. Coronary artery disease involving native coronary artery of native heart without angina pectoris   6. Restrictive lung disease      PLAN:  In order of problems listed above:  1. Chronic diastolic heart failure: Echocardiogram shows borderline low ejection fraction.  His diuretic was increased to 80 mg a.m. and 40 mg p.m., he did notice some improvement in his lower extremity edema.  However he continued to have at least 1+ pitting edema in bilateral lower extremity, I plan to do a trial with higher dose of Lasix with 80 mg twice daily.  I did advise him to increase potassium to 20 meq twice daily as well. He will need 1 week base metabolic panel  2. Restrictive lung disease: Noted on previous PFT.  3. Minimal nonobstructive CAD: Noted on previous cardiac catheterization.  Denies any obvious chest discomfort  4. Hyperlipidemia: Continue Lipitor  5. DM 2: Managed by primary care provider.    Medication Adjustments/Labs and Tests Ordered: Current medicines are reviewed at length with the patient today.  Concerns regarding medicines are outlined above.  Medication changes, Labs and Tests ordered today are listed in the Patient Instructions below. Patient Instructions  Medication Instructions:  Increase lasix to 80 mg twice daily. Increase potassium to 20 mg twice daily.  Lab work: BMP If you have labs (blood work) drawn today and your tests are completely normal, you will receive your results only by: [name] MyChart Message (if you have MyChart) OR . A  paper copy in the mail If you have any lab test that is abnormal or we need to change your treatment, we will call you to review the results.  Testing/Procedures: None ordered.  Follow-Up: At [facility], you and your health needs are our priority.  As part of our continuing mission to provide you with exceptional heart care, we have created designated Provider Care Teams.  These Care Teams include your primary Cardiologist (physician) and Advanced Practice Providers (APPs -  Physician Assistants and Nurse Practitioners) who all work together to provide you with the care you need, when you need it. You will need a follow up appointment in 2 weeks.  Please call our office 2 months in advance to schedule this appointment.  You may see Dr. Sallyanne Fuller or one of the following Advanced Practice Providers on your designated Care Team: Almyra Deforest, [place] . Fabian Sharp, PA-C  Any Other Special Instructions Will Be Listed Below (If Applicable). None      Hilbert Corrigan, [place]  01/20/2018 12:13 AM    [address], [place], [place]  [address] Phone: [phone]; Fax: [phone]

## 2018-01-17 NOTE — Patient Instructions (Addendum)
Medication Instructions:  Increase lasix to 80 mg twice daily. Increase potassium to 20 mg twice daily.  Lab work: BMP If you have labs (blood work) drawn today and your tests are completely normal, you will receive your results only by: [name] MyChart Message (if you have MyChart) OR . A paper copy in the mail If you have any lab test that is abnormal or we need to change your treatment, we will call you to review the results.  Testing/Procedures: None ordered.  Follow-Up: At [facility], you and your health needs are our priority.  As part of our continuing mission to provide you with exceptional heart care, we have created designated Provider Care Teams.  These Care Teams include your primary Cardiologist (physician) and Advanced Practice Providers (APPs -  Physician Assistants and Nurse Practitioners) who all work together to provide you with the care you need, when you need it. You will need a follow up appointment in 2 weeks.  Please call our office 2 months in advance to schedule this appointment.  You may see Dr. Sallyanne Kuster or one of the following Advanced Practice Providers on your designated Care Team: Almyra Deforest, [place] . Fabian Sharp, PA-C  Any Other Special Instructions Will Be Listed Below (If Applicable). None

## 2018-01-17 NOTE — Patient Outreach (Signed)
  [facility]) Care Management  01/17/2018  Nicholes Hibler [place] 04-Jan-1946 [id number]  EMMI-general discharge, COPD, Heart failure, medication adherence, pneumonia, stroke  RED ON EMMI ALERT Day # 4 Date: 01/14/18 1012 Red Alert Reason: Lost interest in things? No    Insurance: HTA  [facility] admissions x 1 ED visitsx 1 in the last 6 months    Outreach attempt # 1 No answer. [facility] RN CM left HIPAA compliant voicemail message along with CM's contact info.   Plan: [facility] RN CM sent an unsuccessful outreach letter and scheduled this patient for another call attempt within 4 business days  Manessa Buley L. Lavina Hamman, RN, BSN, [place] Coordinator Office number [phone] Mobile number [phone]  Main [facility] number [phone] Fax number [phone]

## 2018-01-17 NOTE — Patient Outreach (Addendum)
[facility]) Care Management  [place]   01/17/2018  [name] Bomar [birth year] [id number]  Reason for referral: 30 day post discharge medication review  Current insurance:HTA  PMHx: Hypertension, chronic diastolic heart failure, COPD, GERD, type 2 diabetes mellitus, CKD Stage III, gout and hyperlipidemia  HPI:  Mr. Kresse reports he was recently hospitalized for  heart failure.  He states that he is still not feeling well and is retaining some fluid.  He reports that he weighs daily and knows to call his cardiologist if he gains 3 lbs/day or 5 lbs/week.   Mr. Recore states he has a cardiology appointment this afternoon.    Objective: Lab Results  Component Value Date   CREATININE 1.14 01/16/2018   CREATININE 1.51 (H) 01/09/2018   CREATININE 1.47 (H) 01/08/2018    Lab Results  Component Value Date   HGBA1C 7.1 10/15/2014    Lipid Panel     Component Value Date/Time   CHOL 89 (L) 11/29/2016 0800   CHOL 111 06/12/2012 1232   TRIG 113 11/29/2016 0800   TRIG 141 01/24/2014 1143   TRIG 192 (H) 06/12/2012 1232   HDL 36 (L) 11/29/2016 0800   HDL 38 (L) 01/24/2014 1143   HDL 33 (L) 06/12/2012 1232   CHOLHDL 2.5 11/29/2016 0800   CHOLHDL 4.8 11/22/2006 1712   VLDL 39 11/22/2006 1712   LDLCALC 30 11/29/2016 0800   LDLCALC 27 10/25/2013 1224   LDLCALC 40 06/12/2012 1232    BP Readings from Last 3 Encounters:  01/13/18 124/77  01/12/18 125/73  01/09/18 (!) 159/83    Allergies  Allergen Reactions  . Amlodipine Besy-Benazepril Hcl Swelling and Other (See Comments)    Makes tongue swell (lotrel)  . Oxycodone Itching  . Phenergan [Promethazine Hcl] Other (See Comments)    "I can't remember."  . Hydrocodone Itching    Can tolerate in low doses    Medications Reviewed Today    Reviewed by Dionne Milo, [facility] (Pharmacist) on 01/17/18 at 1523  Med List Status: <None>  Medication Order Taking? Sig Documenting Provider Last  Dose Status Informant  albuterol (PROVENTIL HFA;VENTOLIN HFA) 108 (90 Base) MCG/ACT inhaler [id number] Yes Inhale 2 puffs into the lungs every 6 (six) hours as needed for wheezing or shortness of breath. Brand Males, MD Taking Active Self  atorvastatin (LIPITOR) 40 MG tablet [id number] Yes TAKE 1 TABLET BY MOUTH ONCE DAILY  Patient taking differently:  Take 40 mg by mouth every evening.    [name], [name], MD Taking Active Self  bisoprolol (ZEBETA) 5 MG tablet [id number] Yes Take 0.5 tablets (2.5 mg total) by mouth daily. Croitoru, Mihai, MD Taking Active   budesonide-formoterol [facility]) 160-4.5 MCG/ACT inhaler [id number] Yes Inhale 2 puffs into the lungs 2 (two) times daily. Barton Dubois, MD Taking Active   buPROPion (WELLBUTRIN XL) 150 MG 24 hr tablet [id number] Yes Take 150 mg by mouth daily. [provider] Taking Active         Discontinued 01/17/18 1522 (Change in therapy)   Cholecalciferol (VITAMIN D PO) [id number] Yes Take 5,000 Units by mouth every morning.  [provider] Taking Active Self  clonazePAM (KLONOPIN) 0.5 MG tablet [id number] Yes Take 1 tablet (0.5 mg total) by mouth at bedtime as needed for anxiety.  Patient taking differently:  Take 0.5 mg by mouth at bedtime.    [name], [name], MD Taking Active Self  furosemide (LASIX) 40 MG tablet [id number] Yes Take 2 tablets (80 mg  total) by mouth every morning AND 1 tablet (40 mg total) every evening. Croitoru, Mihai, MD Taking Active   glimepiride (AMARYL) 4 MG tablet [id number] No Take 1 tablet (4 mg total) by mouth daily with breakfast.  Patient not taking:  Reported on 01/17/2018   [name], [name], MD Not Taking Active Self  hydrALAZINE (APRESOLINE) 25 MG tablet [id number] Yes Take 1 tablet (25 mg total) by mouth 3 (three) times daily. Croitoru, Mihai, MD Taking Active   isosorbide dinitrate (ISORDIL) 30 MG tablet [id number] Yes Take 1 tablet (30 mg total) by mouth 3 (three) times daily. Croitoru,  Mihai, MD Taking Active   metFORMIN (GLUCOPHAGE) 1000 MG tablet [id number] Yes TAKE 1 TABLET BY MOUTH TWICE DAILY WITH MEALS  Patient taking differently:  Take 1,000 mg by mouth 2 (two) times daily with a meal.    [name], [name], MD Taking Active Self  omeprazole (PRILOSEC) 40 MG capsule [id number] Yes TAKE 1 CAPSULE BY MOUTH ONCE DAILY  Patient taking differently:  Take 40 mg by mouth every morning.    [name], [name], MD Taking Active Self  [facility] LANCETS 70J MISC [id number] Yes 1 each by Does not apply route daily. Test 1X per day and prn [name], [name], MD Taking Active Self  OXYGEN [id number] Yes Inhale 2 L into the lungs at bedtime. 2lpm with sleep  [provider] Taking Active Self  Polyethyl Glycol-Propyl Glycol (SYSTANE OP) [id number] Yes Apply 1 drop to eye daily as needed (for dry eye relief).  [provider] Taking Active Self  potassium chloride 20 MEQ TBCR [id number] Yes Take 20 mEq by mouth daily. Barton Dubois, MD Taking Active   PRODIGY NO CODING BLOOD GLUC test strip [id number] Yes CHECK BLOOD SUGARS ONCE DAILY [name], [name], MD Taking Active   sertraline (ZOLOFT) 50 MG tablet [id number] Yes Take 50 mg by mouth every morning. [provider] Taking Active Self          ASSESSMENT: Date Discharged from Hospital: 01/09/18 Date Medication Reconciliation Performed: 01/17/2018  Medications Discontinued at Discharge:   Chlorthalidone  dextromethorphan/guifenesin  New Medications at Discharge:   Bisoprolol started 01/13/18  Hydralazine  Isosorbide dinitrate  potassium  Medications with Dose Adjustments at Discharge: . furosemide . glimepiride Patient was recently discharged from hospital and all medications have been reviewed.  Drugs sorted by system:  Neurologic/Psychologic: bupropion, clonazepam, sertraline  Cardiovascular: atorvastatin, bisoprolol, furosemide, hydralazine, isosorbide  dinitrate  Pulmonary/Allergy: albuterol MDI, budesonide/formoterol  Gastrointestinal: omeprazole  Endocrine: glimepiride, metformin  Topical: Systane opthal.  Vitamins/Minerals/Supplements: cholecalciferol, potassium chloride,  Medication Review Findings:  . Glimepiride- Patient states is unsure he is taking glimepiride.  This medication is on his PCP med list from 12/5.  [facility] reports he had it last filled on 9/15 for a 90 day supply.  Glimepiride is on the [employer] list as having the potential to cause severe and prolonged hypoglycemia in the elderly.  There is strong evidence to avoid this medication in advanced age. . Prednisone taper- Patient reports completing prednisone taper on Sunday.  He states he was told his WBC were high, up to 22K.  Elevated WBC may be due to steroids vs infection.   PLAN: Route note to PCP, Dr. Warrick Parisian.   Follow up with patient 12/11 when he is at home to make sure he can locate his bottle of glimepiride.   [name], PharmD Clinical Pharmacist [place] [phone]

## 2018-01-18 ENCOUNTER — Other Ambulatory Visit: Payer: Self-pay | Admitting: *Deleted

## 2018-01-18 ENCOUNTER — Other Ambulatory Visit: Payer: Self-pay | Admitting: Pharmacy Technician

## 2018-01-18 ENCOUNTER — Other Ambulatory Visit: Payer: Self-pay

## 2018-01-18 ENCOUNTER — Ambulatory Visit: Payer: Self-pay

## 2018-01-18 ENCOUNTER — Telehealth: Payer: Self-pay | Admitting: Pharmacist Clinician (PhC)/ Clinical Pharmacy Specialist

## 2018-01-18 NOTE — Patient Outreach (Addendum)
[facility]) Care Management  01/18/2018  Kenneth Fuller Aug 05, 1945 [id number]  Successful outreach to [name].  HIPAA identifiers verified.   I had spoken with [name] yesterday to complete a medication review and he was unable to confirm that he was still taking glimepiride.  Shortly after my call he had an episode of hypoglycemia at his doctor's office. Follow up outreach to [name] today and he states that he is still taking glimepiride 4 mg daily.  He states that he has gotten hypoglycemic before, but that this incident was the lowest it had ever dropped.  Discussed foods to eat if he becomes hypoglycemic along with making sure that he eats enough, so that it doesn't happen again.  He confirms that he does have glucose tablets available.   [name] states that he did not receive his [employer] patient assistance application.  Plan: Route note to [facility] CPhT, Etter Sjogren and request that she resend the application.  [name], PharmD Clinical Pharmacist [place] [phone]

## 2018-01-18 NOTE — Patient Outreach (Signed)
[facility]) Care Management  01/18/2018  Kenneth Fuller Apr 13, 1945 [id number]   EMMI- Heart failure,  RED ON EMMI ALERT Day #4 Date:01/14/18 1012 Red Alert Reason:Lost interest in things?yes   Insurance:HTA [facility] admissions[facility] 1ED visits x 1in the last 6 months    Outreach attempt # 3 successful [name] reports "I don't even remember that question. Half of the time I forget."  He confirms the answer should have been no He states he does not have as much stamina, not much energy but is on CPAP which dries him out  He reports he does not need [facility] SW referral after [facility] RN CM reviewed available resources an [facility] SW services for counseling. [facility] RN CM and [name] discussed medical issues related to low stamina and energy. He was recommended to address issues also with his MD which he states he will do    Social Mr Nodal is married and has support from his wife. He denies transportation concerns. He reports remaining active as possible  Condition COPD, asthma, HTN, CHF, OSA with CPAP Has home oxygen, CKD, DM,  Advance directives Denies need for assist with advance directives   Consent: [facility] RN CM reviewed [facility] services with patient. Patient gave verbal consent for services.   Advised patient that there will be further automated EMMI- post discharge calls to assess how the patient is doing following the recent hospitalization Advised the patient that another call may be received from a nurse if any of their responses were abnormal. Patient voiced understanding and was appreciative of f/u call.  Plan: [facility] RN CM will close case at this time as patient has been assessed and no needs identified.   Pt encouraged to return a call to [facility] RN CM prn  [name], RN, BSN, [place] Coordinator Office number [phone] Mobile number [phone]  Main [facility] number [phone] Fax number [phone]

## 2018-01-18 NOTE — Telephone Encounter (Signed)
Patient saw Almyra Deforest in office today.  When at checkout desk he became weak, shaky and diaphoretic.  [name] (scheduling) called me and I checked his blood sugar.  Was at 65.  Patient had a piece of hard candy and was given a cup of orange juice.  After 10 minutes blood sugar was back up to 77, however he was still feeling weak.  He was then given another cup of orange juice (with 4-5 packs of sugar) as well as a peanut granola bar.   He was further watched for another 15-20 minutes and his blood sugar was up to 11.  He was feeling much better and left the office shortly thereafter.  I advised that he check in with his endocrinologist, as he has recently started dieting, and as of today's incident had only eaten a banana and cup of yogurt.  Explained that with his glimipiride he would need to eat a little more than than to avoid hypoglycemia.

## 2018-01-18 NOTE — Patient Outreach (Signed)
[facility]) Care Management  01/18/2018  Ted Goodner Franckowiak [birth year] [id number]   Informed by [facility] RPh [name] that patient states he has not received patient assistance application that was mailed out to him. Prepared another copy of application to be mailed to patient.  Will follow up with patient in 5-7 business days to confirm application has been received.  Maud Deed Chana Bode [place] Certified Pharmacy Technician [place] Management Direct Dial:[phone]

## 2018-01-18 NOTE — Patient Outreach (Signed)
[place]) Care Management  01/18/2018  Kenneth Fuller [birth year] [id number]   EMMI-general discharge, COPD, Heart failure, medication adherence, pneumonia, stroke  RED ON EMMI ALERT Day # 4 Date: 01/14/18 1012 Red Alert Reason: Lost interest in things? No    Insurance: HTA  [facility] admissions x 1 ED visitsx 1 in the last 6 months    Outreach attempt # 2 No answer. [facility] RN CM left HIPAA compliant voicemail message along with CM's contact info.   Plan: [facility] RN CM scheduled this patient for another call attempt within 4 business days  [name] L. Lavina Hamman, RN, BSN, [place] Coordinator Office number [phone] Mobile number [phone]  Main [facility] number [phone] Fax number [phone]

## 2018-01-19 ENCOUNTER — Ambulatory Visit: Payer: PPO | Admitting: Physician Assistant

## 2018-01-20 ENCOUNTER — Ambulatory Visit: Payer: Self-pay | Admitting: Pharmacy Technician

## 2018-01-20 ENCOUNTER — Ambulatory Visit: Payer: Self-pay | Admitting: *Deleted

## 2018-01-20 ENCOUNTER — Encounter: Payer: Self-pay | Admitting: Physician Assistant

## 2018-01-25 ENCOUNTER — Other Ambulatory Visit: Payer: PPO

## 2018-01-25 DIAGNOSIS — I5032 Chronic diastolic (congestive) heart failure: Secondary | ICD-10-CM | POA: Diagnosis not present

## 2018-01-26 ENCOUNTER — Other Ambulatory Visit: Payer: Self-pay | Admitting: Pharmacy Technician

## 2018-01-26 LAB — BASIC METABOLIC PANEL
BUN/Creatinine Ratio: 15 (ref 10–24)
BUN: 27 mg/dL (ref 8–27)
CO2: 21 mmol/L (ref 20–29)
Calcium: 9.5 mg/dL (ref 8.6–10.2)
Chloride: 92 mmol/L — ABNORMAL LOW (ref 96–106)
Creatinine, Ser: 1.79 mg/dL — ABNORMAL HIGH (ref 0.76–1.27)
GFR calc non Af Amer: 37 mL/min/{1.73_m2} — ABNORMAL LOW (ref >59)
GFR, EST AFRICAN AMERICAN: 43 mL/min/{1.73_m2} — AB (ref >59)
Glucose: 205 mg/dL — ABNORMAL HIGH (ref 65–99)
Potassium: 4.9 mmol/L (ref 3.5–5.2)
Sodium: 140 mmol/L (ref 134–144)

## 2018-01-27 ENCOUNTER — Telehealth: Payer: Self-pay | Admitting: Family Medicine

## 2018-01-27 ENCOUNTER — Other Ambulatory Visit: Payer: Self-pay | Admitting: Pharmacy Technician

## 2018-01-27 MED ORDER — OSELTAMIVIR PHOSPHATE 75 MG PO CAPS: 75.0000 mg | ORAL_CAPSULE | Freq: Two times a day (BID) | ORAL | 0 refills | Status: DC

## 2018-01-27 MED ORDER — ONDANSETRON 4 MG PO TBDP: 4.0000 mg | ORAL_TABLET | Freq: Three times a day (TID) | ORAL | 0 refills | Status: DC | PRN

## 2018-01-27 NOTE — Telephone Encounter (Signed)
Pt states he has been in the bed for 2 days feeling really weak, diarrhea, fever of 102 and body hurts all over with some nausea. Pt thinks he may have the flu. We don't have any openings left today, can you send something in or should I have him come in to be seen tomorrow.

## 2018-01-27 NOTE — Telephone Encounter (Signed)
I sent in Tamiflu for him and Zofran

## 2018-01-27 NOTE — Telephone Encounter (Signed)
Pt aware rx sent in.

## 2018-01-30 ENCOUNTER — Other Ambulatory Visit: Payer: Self-pay | Admitting: Pharmacy Technician

## 2018-01-30 NOTE — Patient Outreach (Signed)
Kenneth Fuller  01/30/2018  Kenneth Fuller Oct 27, 1945 [id number]     Successful call placed to patient regarding patient assistance application(s) for [facility] and Proventil HFA , HIPAA identifiers verified. Patient states he does not think he has received the 2nd application that was mailed out for [facility] and Proventil. Requested that he check with his wife who checks the mail primarily, also confirmed mailing address.   Will follow up with patient in 5-10 business days due to holidays to verify is application has been received.   Maud Deed Chana Bode [place] Certified Pharmacy Technician [place] Fuller Direct [phone]

## 2018-02-03 ENCOUNTER — Encounter: Payer: Self-pay | Admitting: Physician Assistant

## 2018-02-03 ENCOUNTER — Ambulatory Visit (INDEPENDENT_AMBULATORY_CARE_PROVIDER_SITE_OTHER): Payer: PPO | Admitting: Physician Assistant

## 2018-02-03 ENCOUNTER — Other Ambulatory Visit: Payer: Self-pay | Admitting: Family Medicine

## 2018-02-03 VITALS — BP 120/70 | HR 91 | Ht 67.0 in | Wt 274.0 lb

## 2018-02-03 DIAGNOSIS — Z794 Long term (current) use of insulin: Secondary | ICD-10-CM | POA: Diagnosis not present

## 2018-02-03 DIAGNOSIS — E785 Hyperlipidemia, unspecified: Secondary | ICD-10-CM | POA: Diagnosis not present

## 2018-02-03 DIAGNOSIS — J449 Chronic obstructive pulmonary disease, unspecified: Secondary | ICD-10-CM | POA: Diagnosis not present

## 2018-02-03 DIAGNOSIS — I2510 Atherosclerotic heart disease of native coronary artery without angina pectoris: Secondary | ICD-10-CM

## 2018-02-03 DIAGNOSIS — G4733 Obstructive sleep apnea (adult) (pediatric): Secondary | ICD-10-CM | POA: Diagnosis not present

## 2018-02-03 DIAGNOSIS — R531 Weakness: Secondary | ICD-10-CM | POA: Diagnosis not present

## 2018-02-03 DIAGNOSIS — E1169 Type 2 diabetes mellitus with other specified complication: Secondary | ICD-10-CM

## 2018-02-03 DIAGNOSIS — I5032 Chronic diastolic (congestive) heart failure: Secondary | ICD-10-CM | POA: Diagnosis not present

## 2018-02-03 LAB — COMPREHENSIVE METABOLIC PANEL
ALT: 48 IU/L — ABNORMAL HIGH (ref 0–44)
AST: 27 IU/L (ref 0–40)
Albumin/Globulin Ratio: 1.5 (ref 1.2–2.2)
Albumin: 3.7 g/dL (ref 3.5–4.8)
Alkaline Phosphatase: 80 IU/L (ref 39–117)
BUN/Creatinine Ratio: 15 (ref 10–24)
BUN: 22 mg/dL (ref 8–27)
Bilirubin Total: 0.4 mg/dL (ref 0.0–1.2)
CO2: 23 mmol/L (ref 20–29)
Calcium: 9.2 mg/dL (ref 8.6–10.2)
Chloride: 96 mmol/L (ref 96–106)
Creatinine, Ser: 1.42 mg/dL — ABNORMAL HIGH (ref 0.76–1.27)
GFR calc non Af Amer: 49 mL/min/{1.73_m2} — ABNORMAL LOW (ref >59)
GFR, EST AFRICAN AMERICAN: 57 mL/min/{1.73_m2} — AB (ref >59)
Globulin, Total: 2.5 g/dL (ref 1.5–4.5)
Glucose: 121 mg/dL — ABNORMAL HIGH (ref 65–99)
Potassium: 5.1 mmol/L (ref 3.5–5.2)
Sodium: 137 mmol/L (ref 134–144)
TOTAL PROTEIN: 6.2 g/dL (ref 6.0–8.5)

## 2018-02-03 LAB — CBC
Hematocrit: 33.4 % — ABNORMAL LOW (ref 37.5–51.0)
Hemoglobin: 10.9 g/dL — ABNORMAL LOW (ref 13.0–17.7)
MCH: 26.6 pg (ref 26.6–33.0)
MCHC: 32.6 g/dL (ref 31.5–35.7)
MCV: 82 fL (ref 79–97)
Platelets: 388 10*3/uL (ref 150–450)
RBC: 4.1 x10E6/uL — ABNORMAL LOW (ref 4.14–5.80)
RDW: 15.1 % (ref 12.3–15.4)
WBC: 9.2 10*3/uL (ref 3.4–10.8)

## 2018-02-03 LAB — LIPASE: Lipase: 97 U/L — ABNORMAL HIGH (ref 13–78)

## 2018-02-03 MED ORDER — HYDRALAZINE HCL 25 MG PO TABS: 25.0000 mg | ORAL_TABLET | Freq: Three times a day (TID) | ORAL | 3 refills | Status: DC

## 2018-02-03 NOTE — Progress Notes (Signed)
Cardiology Office Note    Date:  02/05/2018   ID:  LATHANIEL LEGATE, DOB [birth year], MRN [id number]  PCP:  [name], [name], MD  Cardiologist:  Dr. Sallyanne Kuster  Chief Complaint  Patient presents with  . Follow-up    seen for Dr. Sallyanne Kuster.     History of Present Illness:  Kenneth Fuller is a 72 y.o. male with PMH of chronic diastolic heart failure, morbid obesity, obstructive sleep apnea, hyperlipidemia, DM 2 with diabetic neuropathy, history of nonobstructive coronary artery disease and moderate severe restrictive lung disease.  He had a cardiac catheterization in October 2018 which showed minimal coronary artery disease.  LVEDP was borderline.  Echocardiogram showed grade 2 DD.  Pulmonary function test showed fairly severe obstructive disease but FEV1 in the 40 to 45% of predicted range.  He did not have significant improvement with bronchodilator.  He used to work in a [employer] in [place]. Patient was recently admitted in November with severe shortness of breath with coughing and wheezing.  BNP was mildly elevated at 330.  Chest x-ray reported borderline to mild cardiomegaly with a small pleural effusion, vascular congestion and interstitial edema.  Echocardiogram showed EF 45 to 50%, which is lower than previously reported.  He was treated with IV steroid, nebulized bronchodilator, antibiotic, loop diuretic and oxygen supplementation.  Discharge weight was 281.2 pounds.  During the last office visit, he still appears to be mildly volume overloaded, so I did a trial of higher dose of Lasix with 80 mg twice daily dosing.  Based on his home blood pressure and weight diary, his weight did drop back down to 265 pounds based on home scale from previous 275 pounds, however later increased back up to 271 pounds.  His recent lab work shows some mild dehydration, therefore I reduced his Lasix back down to the previous dose.  However patient misunderstood instead of going back to 80 mg a.m.  and 40 mg p.m., he has been taking 80 mg a.m. only.  I will switch him back to 80 mg a.m. and 40 mg p.m. at this time.  He is complaining of a lot of other issues today.  One of the issue is weakness and occasional fever since his November discharge.  He says over a week ago, he had a fever of 102.  He does complain of occasional nonproductive cough.  On physical exam today, it was noted he has severely tender epigastric area, left upper quadrant and the left lower quadrant on physical exam.  I am not sure if the tenderness is due to the recent cough or is there another underlying process.  However given the recent weakness, I recommend a complete metabolic panel, CBC and lipase.  He is aware that if his symptoms continue to worsen, he will need to seek medical attention in the hospital again.   Past Medical History:  Diagnosis Date  . Allergy   . Asthma   . CAD (coronary artery disease)   . Diabetes mellitus   . Fibromyalgia   . GERD (gastroesophageal reflux disease)   . GI bleeding   . Gout   . Hyperlipidemia   . Hypertension   . Hypogonadism male   . MRSA cellulitis   . Neuropathy   . Obesity   . Shortness of breath dyspnea    with exertion   . Sleep apnea    cpap- 14   . Wheezing    no asthma diagnosis    Past  Surgical History:  Procedure Laterality Date  . BACK SURGERY    . CARDIAC CATHETERIZATION    . COLONOSCOPY    . LEFT HEART CATH AND CORONARY ANGIOGRAPHY N/A 12/02/2016   Procedure: LEFT HEART CATH AND CORONARY ANGIOGRAPHY;  Surgeon: Jettie Booze, MD;  Location: [place] [facility];  Service: Cardiovascular;  Laterality: N/A;  . LUMBAR LAMINECTOMY/DECOMPRESSION MICRODISCECTOMY N/A 01/02/2014   Procedure: CENTRAL DECOMPRESSION LUMBAR LAMINECTOMY L3-L4, L4-L5;  Surgeon: Tobi Bastos, MD;  Location: [facility];  Service: Orthopedics;  Laterality: N/A;  . neck fusion      Current Medications: Outpatient Medications Prior to Visit  Medication Sig Dispense Refill    . albuterol (PROVENTIL HFA;VENTOLIN HFA) 108 (90 Base) MCG/ACT inhaler Inhale 2 puffs into the lungs every 6 (six) hours as needed for wheezing or shortness of breath. 1 Inhaler 2  . atorvastatin (LIPITOR) 40 MG tablet TAKE 1 TABLET BY MOUTH ONCE DAILY 90 tablet 0  . bisoprolol (ZEBETA) 5 MG tablet Take 0.5 tablets (2.5 mg total) by mouth daily. 45 tablet 3  . budesonide-formoterol (SYMBICORT) 160-4.5 MCG/ACT inhaler Inhale 2 puffs into the lungs 2 (two) times daily. 1 Inhaler 3  . buPROPion (WELLBUTRIN XL) 150 MG 24 hr tablet Take 150 mg by mouth daily.    . Cholecalciferol (VITAMIN D PO) Take 5,000 Units by mouth every morning.     . clonazePAM (KLONOPIN) 0.5 MG tablet Take 1 tablet (0.5 mg total) by mouth at bedtime as needed for anxiety. (Patient taking differently: Take 0.5 mg by mouth at bedtime. ) 25 tablet 1  . furosemide (LASIX) 40 MG tablet Take 80 mg by mouth twice daily. 270 tablet 3  . glimepiride (AMARYL) 4 MG tablet TAKE 1 TABLET BY MOUTH WITH BREAKFAST 90 tablet 0  . isosorbide dinitrate (ISORDIL) 30 MG tablet Take 1 tablet (30 mg total) by mouth 3 (three) times daily. 270 tablet 3  . metFORMIN (GLUCOPHAGE) 1000 MG tablet TAKE 1 TABLET BY MOUTH TWICE DAILY WITH MEALS 180 tablet 0  . omeprazole (PRILOSEC) 40 MG capsule TAKE 1 CAPSULE BY MOUTH ONCE DAILY (Patient taking differently: Take 40 mg by mouth every morning. ) 90 capsule 1  . ondansetron (ZOFRAN ODT) 4 MG disintegrating tablet Take 1 tablet (4 mg total) by mouth every 8 (eight) hours as needed for nausea or vomiting. 20 tablet 0  . ONETOUCH DELICA LANCETS 30S MISC 1 each by Does not apply route daily. Test 1X per day and prn 100 each 2  . oseltamivir (TAMIFLU) 75 MG capsule Take 1 capsule (75 mg total) by mouth 2 (two) times daily. 10 capsule 0  . OXYGEN Inhale 2 L into the lungs at bedtime. 2lpm with sleep     . Polyethyl Glycol-Propyl Glycol (SYSTANE OP) Apply 1 drop to eye daily as needed (for dry eye relief).     .  Potassium Chloride ER 20 MEQ TBCR Take 20 mEq by mouth 2 (two) times daily. 30 tablet 1  . PRODIGY NO CODING BLOOD GLUC test strip CHECK BLOOD SUGARS ONCE DAILY 50 each 11  . sertraline (ZOLOFT) 50 MG tablet TAKE 1 TABLET BY MOUTH ONCE DAILY 30 tablet 2  . hydrALAZINE (APRESOLINE) 25 MG tablet Take 1 tablet (25 mg total) by mouth 3 (three) times daily. 270 tablet 3   No facility-administered medications prior to visit.      Allergies:   Amlodipine besy-benazepril hcl; Oxycodone; Phenergan [promethazine hcl]; and Hydrocodone   Social History   Socioeconomic  History  . Marital status: Married    Spouse name: Not on file  . Number of children: 3  . Years of education: Not on file  . Highest education level: Not on file  Occupational History  . Occupation: retired/disability 1999    Employer: DISABLED    Comment: Textiles  Social Needs  . Financial resource strain: Not on file  . Food insecurity:    Worry: Not on file    Inability: Not on file  . Transportation needs:    Medical: Not on file    Non-medical: Not on file  Tobacco Use  . Smoking status: Former Smoker    Last attempt to quit: 02/08/1969    Years since quitting: 49.0  . Smokeless tobacco: Former Network engineer and Sexual Activity  . Alcohol use: No    Alcohol/week: 0.0 standard drinks  . Drug use: No  . Sexual activity: Not on file  Lifestyle  . Physical activity:    Days per week: Not on file    Minutes per session: Not on file  . Stress: Not on file  Relationships  . Social connections:    Talks on phone: Not on file    Gets together: Not on file    Attends religious service: Not on file    Active member of club or organization: Not on file    Attends meetings of clubs or organizations: Not on file    Relationship status: Not on file  Other Topics Concern  . Not on file  Social History Narrative   Drinks caffeine tea occasionally      Family History:  The patient's family history includes Colon  cancer in his mother; Deep vein thrombosis in his brother; Diabetes in his father; Drug abuse in his sister; Heart attack in his father; Heart attack (age of onset: 20) in his son; Heart disease in his brother, brother, and father; Heart failure in his sister; Kidney disease in his sister.   ROS:   Please see the history of present illness.    ROS All other systems reviewed and are negative.   PHYSICAL EXAM:   VS:  BP 120/70   Pulse 91   Ht _0  (1.702 m)   Wt 274 lb (124.3 kg)   BMI 42.91 kg/m    GEN: Well nourished, well developed, in no acute distress  HEENT: normal  Neck: no JVD, carotid bruits, or masses Cardiac: RRR; no murmurs, rubs, or gallops,no edema  Respiratory:  clear to auscultation bilaterally, normal work of breathing GI: soft, nondistended, + BS +epigastric tenderness MS: no deformity or atrophy  Skin: warm and dry, no rash Neuro:  Alert and Oriented x 3, Strength and sensation are intact Psych: euthymic mood, full affect  Wt Readings from Last 3 Encounters:  02/03/18 274 lb (124.3 kg)  01/17/18 275 lb 6.4 oz (124.9 kg)  01/13/18 280 lb 3.2 oz (127.1 kg)      Studies/Labs Reviewed:   EKG:  EKG is not ordered today.   Recent Labs: 02/25/2017: TSH 3.30 01/04/2018: B Natriuretic Peptide 330.0 01/16/2018: NT-Pro BNP 652 02/03/2018: ALT 48; BUN 22; Creatinine, Ser 1.42; Hemoglobin 10.9; Platelets 388; Potassium 5.1; Sodium 137   Lipid Panel    Component Value Date/Time   CHOL 89 (L) 11/29/2016 0800   CHOL 111 06/12/2012 1232   TRIG 113 11/29/2016 0800   TRIG 141 01/24/2014 1143   TRIG 192 (H) 06/12/2012 1232   HDL 36 (L) 11/29/2016 0800  HDL 38 (L) 01/24/2014 1143   HDL 33 (L) 06/12/2012 1232   CHOLHDL 2.5 11/29/2016 0800   CHOLHDL 4.8 11/22/2006 1712   VLDL 39 11/22/2006 1712   LDLCALC 30 11/29/2016 0800   LDLCALC 27 10/25/2013 1224   LDLCALC 40 06/12/2012 1232    Additional studies/ records that were reviewed today include:    Echo  01/05/2018 LV EF: 45% -   50% Study Conclusions  - Left ventricle: The cavity size was mildly dilated. Wall   thickness was normal. Systolic function was mildly reduced. The   estimated ejection fraction was in the range of 45% to 50%. There   was an increased relative contribution of atrial contraction to   ventricular filling. Doppler parameters are consistent with   abnormal left ventricular relaxation (grade 1 diastolic   dysfunction). Mitral annulus tissue Doppler was not recorded.   Diastolic function assessment is incomplete, but avalable data   suggest normal mean left atrial pressure. - Mitral valve: There was mild regurgitation. - Left atrium: The atrium was moderately dilated.  Impressions:  - Poor quality apical views. The study should be repeated with   Definity contrast.   LV systolic function appears to have worsened since the previous   study, but there is low confidence in this assessment due to   image quality.  ASSESSMENT:    1. Weakness   2. Chronic diastolic heart failure ([place])   3. OSA (obstructive sleep apnea)   4. Hyperlipidemia, unspecified hyperlipidemia type   5. Type 2 diabetes mellitus with other specified complication, with long-term current use of insulin ([place])   6. Coronary artery disease involving native coronary artery of native heart without angina pectoris   7. Chronic obstructive pulmonary disease, unspecified COPD type ([place])      PLAN:  In order of problems listed above:  1. Weakness: This is a nonspecific finding, patient complains of fever and occasional nonproductive cough.  He also has a very tender epigastric area and also left upper and lower quadrant area on physical exam.  I am not entirely sure if the 2 are interrelated.  I will obtain complete metabolic panel, CBC and lipase as initial work-up.  He likely will need additional work-up by primary care provider.  2. Chronic diastolic heart failure: Due to worsening renal  function, I did decrease his Lasix to 80 mg a.m. and 40 mg p.m.  3. CAD: Minimal disease noted on cardiac catheterization in 2018   4. Hyperlipidemia: On Lipitor 40 mg daily  5. DM2: Managed by primary care provider  6. COPD: Severe obstructive disease noted on previous PFT.  Not oxygen dependent.    Medication Adjustments/Labs and Tests Ordered: Current medicines are reviewed at length with the patient today.  Concerns regarding medicines are outlined above.  Medication changes, Labs and Tests ordered today are listed in the Patient Instructions below. Patient Instructions  Medication Instructions:  Start Lasix 80 mg in the morning, and 40 mg in the evening. If you need a refill on your cardiac medications before your next appointment, please call your pharmacy.   Lab work: CMP, CBC, Lipase today. If you have labs (blood work) drawn today and your tests are completely normal, you will receive your results only by: [name] MyChart Message (if you have MyChart) OR . A paper copy in the mail If you have any lab test that is abnormal or we need to change your treatment, we will call you to review the results.  Follow-Up:  At [facility], you and your health needs are our priority.  As part of our continuing mission to provide you with exceptional heart care, we have created designated Provider Care Teams.  These Care Teams include your primary Cardiologist (physician) and Advanced Practice Providers (APPs -  Physician Assistants and Nurse Practitioners) who all work together to provide you with the care you need, when you need it. [name] Keep follow up appointment as scheduled.      Hilbert Corrigan, [place]  02/05/2018 11:22 PM    [address], [place], [place]  [address] Phone: [phone]; Fax: [phone]

## 2018-02-03 NOTE — Patient Instructions (Signed)
Medication Instructions:  Start Lasix 80 mg in the morning, and 40 mg in the evening. If you need a refill on your cardiac medications before your next appointment, please call your pharmacy.   Lab work: CMP, CBC, Lipase today. If you have labs (blood work) drawn today and your tests are completely normal, you will receive your results only by: [name] MyChart Message (if you have MyChart) OR . A paper copy in the mail If you have any lab test that is abnormal or we need to change your treatment, we will call you to review the results.  Follow-Up: At [facility], you and your health needs are our priority.  As part of our continuing mission to provide you with exceptional heart care, we have created designated Provider Care Teams.  These Care Teams include your primary Cardiologist (physician) and Advanced Practice Providers (APPs -  Physician Assistants and Nurse Practitioners) who all work together to provide you with the care you need, when you need it. [name] Keep follow up appointment as scheduled.

## 2018-02-05 ENCOUNTER — Encounter: Payer: Self-pay | Admitting: Physician Assistant

## 2018-02-06 ENCOUNTER — Other Ambulatory Visit: Payer: Self-pay | Admitting: Pharmacy Technician

## 2018-02-06 ENCOUNTER — Other Ambulatory Visit: Payer: PPO

## 2018-02-06 DIAGNOSIS — E1142 Type 2 diabetes mellitus with diabetic polyneuropathy: Secondary | ICD-10-CM

## 2018-02-06 DIAGNOSIS — I10 Essential (primary) hypertension: Secondary | ICD-10-CM

## 2018-02-06 DIAGNOSIS — E1169 Type 2 diabetes mellitus with other specified complication: Secondary | ICD-10-CM | POA: Diagnosis not present

## 2018-02-06 DIAGNOSIS — E1159 Type 2 diabetes mellitus with other circulatory complications: Secondary | ICD-10-CM

## 2018-02-06 DIAGNOSIS — E785 Hyperlipidemia, unspecified: Secondary | ICD-10-CM | POA: Diagnosis not present

## 2018-02-06 DIAGNOSIS — I152 Hypertension secondary to endocrine disorders: Secondary | ICD-10-CM

## 2018-02-06 NOTE — Progress Notes (Signed)
Omeprazole, statins and furosemide are among the drugs that can cause pancreatitis. Have we ordered repeat lipase? Do you know when he was to see his PCP? MCr

## 2018-02-06 NOTE — Patient Outreach (Signed)
[facility]) Care Management  02/06/2018  Breckan Cafiero [place]an[place] [birth year] [id number]    Successful call placed to patient regarding patient assistance application(s) for [facility] and Proventil HFA , HIPAA identifiers verified. Patient states that he received assistance applications and thinks his wife mailed them back in last week.  Will follow up with patient in 10-14 business days if application has not been received.   Maud Deed Chana Bode [place] Certified Pharmacy Technician [place] Management Direct Dial:[phone]

## 2018-02-06 NOTE — Progress Notes (Signed)
Kidney function slowly improving, repeat BMET in 1 week. Red blood cell count stable. Lipase is borderline high, given his recent abdominal pain, he will need to make sure he is seen by PCP as soon as possible. Forward lab to primary care provider

## 2018-02-07 LAB — PSA, TOTAL AND FREE
PSA, Free Pct: 20 %
PSA, Free: 0.04 ng/mL
Prostate Specific Ag, Serum: 0.2 ng/mL (ref 0.0–4.0)

## 2018-02-07 LAB — LIPID PANEL
Chol/HDL Ratio: 2.3 ratio (ref 0.0–5.0)
Cholesterol, Total: 74 mg/dL — ABNORMAL LOW (ref 100–199)
HDL: 32 mg/dL — ABNORMAL LOW (ref >39)
LDL Calculated: 19 mg/dL (ref 0–99)
Triglycerides: 113 mg/dL (ref 0–149)
VLDL Cholesterol Cal: 23 mg/dL (ref 5–40)

## 2018-02-07 LAB — HEPATIC FUNCTION PANEL
ALT: 35 IU/L (ref 0–44)
AST: 20 IU/L (ref 0–40)
Albumin: 3.8 g/dL (ref 3.5–4.8)
Alkaline Phosphatase: 71 IU/L (ref 39–117)
Bilirubin Total: 0.3 mg/dL (ref 0.0–1.2)
Bilirubin, Direct: 0.16 mg/dL (ref 0.00–0.40)
TOTAL PROTEIN: 6.1 g/dL (ref 6.0–8.5)

## 2018-02-13 ENCOUNTER — Encounter

## 2018-02-13 ENCOUNTER — Encounter: Payer: Self-pay | Admitting: Cardiovascular Disease

## 2018-02-13 ENCOUNTER — Ambulatory Visit: Payer: PPO | Admitting: Cardiovascular Disease

## 2018-02-13 VITALS — BP 114/60 | HR 78 | Ht 67.0 in | Wt 267.0 lb

## 2018-02-13 DIAGNOSIS — J449 Chronic obstructive pulmonary disease, unspecified: Secondary | ICD-10-CM | POA: Diagnosis not present

## 2018-02-13 DIAGNOSIS — I10 Essential (primary) hypertension: Secondary | ICD-10-CM

## 2018-02-13 DIAGNOSIS — I5042 Chronic combined systolic (congestive) and diastolic (congestive) heart failure: Secondary | ICD-10-CM

## 2018-02-13 DIAGNOSIS — G4733 Obstructive sleep apnea (adult) (pediatric): Secondary | ICD-10-CM | POA: Diagnosis not present

## 2018-02-13 DIAGNOSIS — E1169 Type 2 diabetes mellitus with other specified complication: Secondary | ICD-10-CM | POA: Diagnosis not present

## 2018-02-13 DIAGNOSIS — E785 Hyperlipidemia, unspecified: Secondary | ICD-10-CM | POA: Diagnosis not present

## 2018-02-13 DIAGNOSIS — K859 Acute pancreatitis without necrosis or infection, unspecified: Secondary | ICD-10-CM

## 2018-02-13 DIAGNOSIS — E669 Obesity, unspecified: Secondary | ICD-10-CM | POA: Diagnosis not present

## 2018-02-13 DIAGNOSIS — I2510 Atherosclerotic heart disease of native coronary artery without angina pectoris: Secondary | ICD-10-CM | POA: Diagnosis not present

## 2018-02-13 DIAGNOSIS — N183 Chronic kidney disease, stage 3 unspecified: Secondary | ICD-10-CM

## 2018-02-13 LAB — BASIC METABOLIC PANEL
BUN/Creatinine Ratio: 12 (ref 10–24)
BUN: 23 mg/dL (ref 8–27)
CO2: 24 mmol/L (ref 20–29)
Calcium: 9.5 mg/dL (ref 8.6–10.2)
Chloride: 92 mmol/L — ABNORMAL LOW (ref 96–106)
Creatinine, Ser: 1.92 mg/dL — ABNORMAL HIGH (ref 0.76–1.27)
GFR calc Af Amer: 40 mL/min/{1.73_m2} — ABNORMAL LOW (ref >59)
GFR, EST NON AFRICAN AMERICAN: 34 mL/min/{1.73_m2} — AB (ref >59)
Glucose: 91 mg/dL (ref 65–99)
POTASSIUM: 4.5 mmol/L (ref 3.5–5.2)
Sodium: 135 mmol/L (ref 134–144)

## 2018-02-13 LAB — LIPASE: Lipase: 65 U/L (ref 13–78)

## 2018-02-13 MED ORDER — FUROSEMIDE 40 MG PO TABS: ORAL_TABLET | ORAL | 3 refills | Status: DC

## 2018-02-13 NOTE — Patient Instructions (Signed)
Medication Instructions:  Dr Sallyanne Kuster has recommended making the following medication changes: 1. STOP Metformin 2. STOP Omeprazole 3. TAKE Furosemide based on your daily weight Take 80 mg (2 tablets) in the morning Take 40 mg (1 tablet) in the afternoon ONLY if your weight greater than 265 pounds on your home scale  If you need a refill on your cardiac medications before your next appointment, please call your pharmacy.   Lab work: Your physician recommends that you return for lab work TODAY.  If you have labs (blood work) drawn today and your tests are completely normal, you will receive your results only by: [name] MyChart Message (if you have MyChart) OR . A paper copy in the mail If you have any lab test that is abnormal or we need to change your treatment, we will call you to review the results.  Follow-Up: Your physician recommends that you schedule a follow-up appointment in 2 months with Almyra Deforest, PA.  Dr Sallyanne Kuster recommends that you schedule a follow-up appointment in 6 months. You will receive a reminder letter in the mail two months in advance. If you don't receive a letter, please call our office to schedule the follow-up appointment.

## 2018-02-13 NOTE — Progress Notes (Signed)
Cardiology Office Note    Date:  02/13/2018   ID:  Kenneth Fuller, DOB February 27, 1945, MRN [id number]  PCP:  [name], [name], MD  Cardiologist:   Sanda Klein, MD   Chief Complaint  Patient presents with  . Follow-up  . Shortness of Breath    At times.  . Headache    History of Present Illness:  Kenneth Fuller is a 73 y.o. male with chronic diastolic heart failure, morbid obesity, obstructive sleep apnea, hyperlipidemia, diabetes mellitus complicated by neuropathy, minor nonobstructive coronary atherosclerosis, moderately severe restrictive lung disease.  She has a history of angioedema with ACE inhibitors.  He is breathing better.  He denies orthopnea or PND and is able to climb a flight of stairs without stopping to catch his breath.  Seems to have NYHA functional class II.  Occasionally gets dizzy when getting out of bed first thing in the morning.  Currently taking furosemide 80 mg in the morning and 40 mg in the afternoon.  On his home scale today he weighed 264 pounds (3 pounds less than our office scale, the lowest he is been in a long time.  Continues to have problems with nausea and "dry heaves".  Had some transient cold sweats.  Abdominal pain has resolved.  His lipase was slightly elevated at his last appointment in late December and has not been rechecked.  Liver tests were normal.  Potassium was borderline high.  Creatinine was normal.  Had taken Tamiflu about 2 or 3 weeks before that.  Reviewed his list of medications with furosemide and omeprazole standing out of potential causes for pancreatitis, although both medications are long-term therapy.  He was recently hospitalized for severe shortness of breath from November 27 through December 2.  Dyspnea onset was insidious.  It was accompanied by coughing and wheezing.  Chronic mild leg swelling was described as unchanged.  On admission his BNP was slightly elevated at 330 (previous baseline 72.6 in 2017).  His chest  x-ray showed "borderline to mild cardiomegaly with small pleural effusion, vascular congestion and mild interstitial edema".  The ECG rhythm was sinus tachycardia.  An echo during that hospitalization which was a mediocre quality study but showed an ejection fraction decreased at 45-50%, lower than previously reported.  Data was missing to permit full evaluation of diastolic function, but the A-wave dominant mitral inflow suggested that he was "not wet".  Admission weight was not recorded, but the next day on November 28 he weighed 125.3 kg (276.6 pounds).  He was treated with intravenous steroids, nebulized bronchodilators, antibiotics as well as loop diuretics and oxygen supplementation.  Cardiology was not consulted.  Discharge weight is documented at 127.4 kg (281.2 pounds).  He reports "peeing a whole lot".  But there is no formal record of his in/out available for review.  The patient was prescribed an angiotensin receptor blocker prior to this admission, but this was held at discharge due to elevation in creatinine level.  Noted that he has a history of previous angioedema when taking Lotrel (amlodipine-benazepril).  He was discharged on hydralazine and long-acting nitrates in a twice daily regimen.  His beta-blocker, bisoprolol was held several months ago by Dr. Warrick Parisian, due to concerns about frequent respiratory exacerbations.  He was prescribed metoprolol at discharge from this last hospitalization.  He underwent cardiac catheterization in October 2018 he has preserved left ventricular systolic function and minimal coronary artery disease.  LVEDP was borderline at the time of catheterization (10-14 mmHg).  His echo did show grade 2 diastolic dysfunction.  However pulmonary function test had shown fairly severe obstructive lung disease with FEV1 in the 40-45% of predicted range.  He did not have much improvement with bronchodilators either on the PFTs or clinically.  He does have a history of  roughly 5 pack years of smoking, but quit 40 years ago.  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  Chest CT did not show evidence of severe interstitial lung disease.    Past Medical History:  Diagnosis Date  . Allergy   . Asthma   . CAD (coronary artery disease)   . Diabetes mellitus   . Fibromyalgia   . GERD (gastroesophageal reflux disease)   . GI bleeding   . Gout   . Hyperlipidemia   . Hypertension   . Hypogonadism male   . MRSA cellulitis   . Neuropathy   . Obesity   . Shortness of breath dyspnea    with exertion   . Sleep apnea    cpap- 14   . Wheezing    no asthma diagnosis    Past Surgical History:  Procedure Laterality Date  . BACK SURGERY    . CARDIAC CATHETERIZATION    . COLONOSCOPY    . LEFT HEART CATH AND CORONARY ANGIOGRAPHY N/A 12/02/2016   Procedure: LEFT HEART CATH AND CORONARY ANGIOGRAPHY;  Surgeon: Jettie Booze, MD;  Location: [facility];  Service: Cardiovascular;  Laterality: N/A;  . LUMBAR LAMINECTOMY/DECOMPRESSION MICRODISCECTOMY N/A 01/02/2014   Procedure: CENTRAL DECOMPRESSION LUMBAR LAMINECTOMY L3-L4, L4-L5;  Surgeon: Tobi Bastos, MD;  Location: [facility];  Service: Orthopedics;  Laterality: N/A;  . neck fusion      Current Medications: Outpatient Medications Prior to Visit  Medication Sig Dispense Refill  . albuterol (PROVENTIL HFA;VENTOLIN HFA) 108 (90 Base) MCG/ACT inhaler Inhale 2 puffs into the lungs every 6 (six) hours as needed for wheezing or shortness of breath. 1 Inhaler 2  . atorvastatin (LIPITOR) 40 MG tablet TAKE 1 TABLET BY MOUTH ONCE DAILY 90 tablet 0  . bisoprolol (ZEBETA) 5 MG tablet Take 0.5 tablets (2.5 mg total) by mouth daily. 45 tablet 3  . budesonide-formoterol (SYMBICORT) 160-4.5 MCG/ACT inhaler Inhale 2 puffs into the lungs 2 (two) times daily. 1 Inhaler 3  . buPROPion (WELLBUTRIN XL) 150 MG 24 hr tablet Take 150 mg by mouth daily.    . Cholecalciferol (VITAMIN D PO)  Take 5,000 Units by mouth every morning.     . clonazePAM (KLONOPIN) 0.5 MG tablet Take 1 tablet (0.5 mg total) by mouth at bedtime as needed for anxiety. (Patient taking differently: Take 0.5 mg by mouth at bedtime. ) 25 tablet 1  . glimepiride (AMARYL) 4 MG tablet TAKE 1 TABLET BY MOUTH WITH BREAKFAST 90 tablet 0  . hydrALAZINE (APRESOLINE) 25 MG tablet Take 1 tablet (25 mg total) by mouth 3 (three) times daily. 270 tablet 3  . isosorbide dinitrate (ISORDIL) 30 MG tablet Take 1 tablet (30 mg total) by mouth 3 (three) times daily. 270 tablet 3  . ondansetron (ZOFRAN ODT) 4 MG disintegrating tablet Take 1 tablet (4 mg total) by mouth every 8 (eight) hours as needed for nausea or vomiting. 20 tablet 0  . ONETOUCH DELICA LANCETS 76L MISC 1 each by Does not apply route daily. Test 1X per day and prn 100 each 2  . oseltamivir (TAMIFLU) 75 MG capsule Take 1 capsule (75 mg total) by  mouth 2 (two) times daily. 10 capsule 0  . OXYGEN Inhale 2 L into the lungs at bedtime. 2lpm with sleep     . Polyethyl Glycol-Propyl Glycol (SYSTANE OP) Apply 1 drop to eye daily as needed (for dry eye relief).     . Potassium Chloride ER 20 MEQ TBCR Take 20 mEq by mouth 2 (two) times daily. 30 tablet 1  . PRODIGY NO CODING BLOOD GLUC test strip CHECK BLOOD SUGARS ONCE DAILY 50 each 11  . sertraline (ZOLOFT) 50 MG tablet TAKE 1 TABLET BY MOUTH ONCE DAILY 30 tablet 2  . furosemide (LASIX) 40 MG tablet Take 80 mg by mouth twice daily. 270 tablet 3  . metFORMIN (GLUCOPHAGE) 1000 MG tablet TAKE 1 TABLET BY MOUTH TWICE DAILY WITH MEALS 180 tablet 0  . omeprazole (PRILOSEC) 40 MG capsule TAKE 1 CAPSULE BY MOUTH ONCE DAILY (Patient taking differently: Take 40 mg by mouth every morning. ) 90 capsule 1   No facility-administered medications prior to visit.      Allergies:   Amlodipine besy-benazepril hcl; Oxycodone; Phenergan [promethazine hcl]; and Hydrocodone   Social History   Socioeconomic History  . Marital status:  Married    Spouse name: Not on file  . Number of children: 3  . Years of education: Not on file  . Highest education level: Not on file  Occupational History  . Occupation: retired/disability 1999    Employer: DISABLED    Comment: Textiles  Social Needs  . Financial resource strain: Not on file  . Food insecurity:    Worry: Not on file    Inability: Not on file  . Transportation needs:    Medical: Not on file    Non-medical: Not on file  Tobacco Use  . Smoking status: Former Smoker    Last attempt to quit: 02/08/1969    Years since quitting: 49.0  . Smokeless tobacco: Former Network engineer and Sexual Activity  . Alcohol use: No    Alcohol/week: 0.0 standard drinks  . Drug use: No  . Sexual activity: Not on file  Lifestyle  . Physical activity:    Days per week: Not on file    Minutes per session: Not on file  . Stress: Not on file  Relationships  . Social connections:    Talks on phone: Not on file    Gets together: Not on file    Attends religious service: Not on file    Active member of club or organization: Not on file    Attends meetings of clubs or organizations: Not on file    Relationship status: Not on file  Other Topics Concern  . Not on file  Social History Narrative   Drinks caffeine tea occasionally      Family History:  The patient's family history includes Colon cancer in his mother; Deep vein thrombosis in his brother; Diabetes in his father; Drug abuse in his sister; Heart attack in his father; Heart attack (age of onset: 30) in his son; Heart disease in his brother, brother, and father; Heart failure in his sister; Kidney disease in his sister.   ROS:   Please see the history of present illness.    ROS all other symptoms are reviewed and are negative   PHYSICAL EXAM:   VS:  BP 114/60 (BP Location: Left Arm, Patient Position: Sitting, Cuff Size: Large)   Pulse 78   Ht _0  (1.702 m)   Wt 267 lb (121.1 kg)  BMI 41.82 kg/m      General:  Alert, oriented x3, no distress, exam is limited by morbid obesity Head: no evidence of trauma, PERRL, EOMI, no exophtalmos or lid lag, no myxedema, no xanthelasma; normal ears, nose and oropharynx Neck: normal jugular venous pulsations and no hepatojugular reflux; brisk carotid pulses without delay and no carotid bruits Chest: clear to auscultation, no signs of consolidation by percussion or palpation, normal fremitus, symmetrical and full respiratory excursions Cardiovascular: normal position and quality of the apical impulse, regular rhythm, normal first and second heart sounds, no murmurs, rubs or gallops Abdomen: no tenderness or distention, no masses by palpation, no abnormal pulsatility or arterial bruits, normal bowel sounds, no hepatosplenomegaly Extremities: no clubbing, cyanosis or edema; 2+ radial, ulnar and brachial pulses bilaterally; 2+ right femoral, posterior tibial and dorsalis pedis pulses; 2+ left femoral, posterior tibial and dorsalis pedis pulses; no subclavian or femoral bruits Neurological: grossly nonfocal Psych: Normal mood and affect   Wt Readings from Last 3 Encounters:  02/13/18 267 lb (121.1 kg)  02/03/18 274 lb (124.3 kg)  01/17/18 275 lb 6.4 oz (124.9 kg)      Studies/Labs Reviewed:   EKG:  EKG is not ordered today. 01/04/2018 tracing shows mild sinus tachycardia, rightward axis, no repolarization abnormalities. Recent Labs: 02/25/2017: TSH 3.30 01/04/2018: B Natriuretic Peptide 330.0 01/16/2018: NT-Pro BNP 652 02/03/2018: BUN 22; Creatinine, Ser 1.42; Hemoglobin 10.9; Platelets 388; Potassium 5.1; Sodium 137 02/06/2018: ALT 35   Lipid Panel    Component Value Date/Time   CHOL 74 (L) 02/06/2018 1113   CHOL 111 06/12/2012 1232   TRIG 113 02/06/2018 1113   TRIG 141 01/24/2014 1143   TRIG 192 (H) 06/12/2012 1232   HDL 32 (L) 02/06/2018 1113   HDL 38 (L) 01/24/2014 1143   HDL 33 (L) 06/12/2012 1232   CHOLHDL 2.3 02/06/2018 1113   CHOLHDL 4.8  11/22/2006 1712   VLDL 39 11/22/2006 1712   LDLCALC 19 02/06/2018 1113   LDLCALC 27 10/25/2013 1224   LDLCALC 40 06/12/2012 1232    Labs from 11/29/2016 Total cholesterol 89, HDL 36, LDL 30, triglycerides 113, normal liver function tests, hemoglobin 13.7 02/25/2017 Creatinine 1.12, potassium 4.2, BNP 97, TSH 3.3 03/15/2017 Hemoglobin A1c 7.6%  ASSESSMENT:    1. Chronic combined systolic and diastolic heart failure ([place])   2. Chronic obstructive pulmonary disease, unspecified COPD type ([place])   3. OSA (obstructive sleep apnea)   4. Essential hypertension   5. Coronary artery disease involving native coronary artery of native heart without angina pectoris   6. Dyslipidemia   7. Diabetes mellitus type 2 in obese ([place])   8. CKD (chronic kidney disease) stage 3, GFR 30-59 ml/min ([facility])   9. Morbid obesity ([place])   10. Acute pancreatitis without infection or necrosis, unspecified pancreatitis type      PLAN:  In order of problems listed above:  1. CHF: EF estimated to be 45-50% most recently.  He has improved dyspnea on a higher dose of diuretic and with approximately 10 pounds of weight loss.  As far as I can tell he is NYHA functional class II and euvolemic, may be slightly hypovolemic.  He now has some symptoms suggesting orthostatic hypotension.  We will give him a weight-based prescription for furosemide.  If he weighs less than 265 pounds he should only take 80 mg of furosemide in the morning.  If he weighs more he will take an additional 40 mg in the afternoon.  Continue 3  times daily hydralazine/nitrates. 2. COPD: He had markedly abnormal PFTs in the past which I thought were responsible for his chronic dyspnea.  Using bisoprolol rather than less selective agent since he has reactive airway disease. 3. OSA: Compliant with therapy and tolerating it well with good results. 4. HTN: Well-controlled 5. CAD: No angina pectoris reported.  Repeat coronary angiography in October 2018 showed  only minor nonobstructive atherosclerosis, no progression from 2011 6. HLP: Very good LDL on lipid profile, chronically low HDL related to obesity. 7. DM: Earlier in the year his glycemic control was slightly inadequate with an A1c of 7.6%, but in October his hemoglobin A1c had decreased to 6.0%.  Currently his blood sugars are elevated due to treatment with steroids. 8. CKD: Most recent creatinine 1.4, in his usual range.  Admission creatinine was 1.35, discharge creatinine was 1.51, GFR around 50.  In 2017 his creatinine was less than 1.0, with a lowest creatinine in the last 12 months was 1.25 in April 9. Morbid obesity is clearly a big part of his problems with shortness of breath.  It definitely makes it hard to assess his volume status. 10. Elevated lipase: Together with nausea/vomiting and abdominal discomfort this would suggest 30 diagnosis of mild pancreatitis.  Symptoms seem to be improving but have not resolved completely.  Hold omeprazole and metformin which may be causing diarrhea.  Omeprazole has also been occasionally implicated in pancreatitis.  Recheck a lipase level and basic metabolic panel.  He has an appointment with his PCP in another couple of days.    Medication Adjustments/Labs and Tests Ordered: Current medicines are reviewed at length with the patient today.  Concerns regarding medicines are outlined above.  Medication changes, Labs and Tests ordered today are listed in the Patient Instructions below. Patient Instructions  Medication Instructions:  Dr Sallyanne Kuster has recommended making the following medication changes: 1. STOP Metformin 2. STOP Omeprazole 3. TAKE Furosemide based on your daily weight Take 80 mg (2 tablets) in the morning Take 40 mg (1 tablet) in the afternoon ONLY if your weight greater than 265 pounds on your home scale  If you need a refill on your cardiac medications before your next appointment, please call your pharmacy.   Lab work: Your physician  recommends that you return for lab work TODAY.  If you have labs (blood work) drawn today and your tests are completely normal, you will receive your results only by: [name] MyChart Message (if you have MyChart) OR . A paper copy in the mail If you have any lab test that is abnormal or we need to change your treatment, we will call you to review the results.  Follow-Up: Your physician recommends that you schedule a follow-up appointment in 2 months with Almyra Deforest, PA.  Dr Sallyanne Kuster recommends that you schedule a follow-up appointment in 6 months. You will receive a reminder letter in the mail two months in advance. If you don't receive a letter, please call our office to schedule the follow-up appointment.    Signed, Sanda Klein, MD  02/13/2018 12:00 PM    [address], [place], [place]  [address] Phone: [phone]; Fax: [phone]

## 2018-02-14 ENCOUNTER — Other Ambulatory Visit: Payer: Self-pay | Admitting: Pharmacy Technician

## 2018-02-14 ENCOUNTER — Other Ambulatory Visit: Payer: PPO

## 2018-02-14 NOTE — Patient Outreach (Signed)
[facility] [facility]) Care Management  02/14/2018  Kenneth Fuller 1945/03/16 [id number]   Received patient portion of [employer] patient assistance application for [facility] and [employer] HFA. Prepared completed application to be mailed to company.   Will follow up with [employer] in 10-14 business days to check status of application.  Maud Deed Chana Bode [place] Certified Pharmacy Technician [place] Management Direct [phone]

## 2018-02-16 ENCOUNTER — Ambulatory Visit (INDEPENDENT_AMBULATORY_CARE_PROVIDER_SITE_OTHER): Payer: PPO | Admitting: Family Medicine

## 2018-02-16 ENCOUNTER — Encounter: Payer: Self-pay | Admitting: Family Medicine

## 2018-02-16 VITALS — BP 118/70 | HR 69 | Temp 97.6°F | Ht 67.0 in | Wt 268.8 lb

## 2018-02-16 DIAGNOSIS — I5042 Chronic combined systolic (congestive) and diastolic (congestive) heart failure: Secondary | ICD-10-CM | POA: Diagnosis not present

## 2018-02-16 DIAGNOSIS — E119 Type 2 diabetes mellitus without complications: Secondary | ICD-10-CM | POA: Diagnosis not present

## 2018-02-16 DIAGNOSIS — K8020 Calculus of gallbladder without cholecystitis without obstruction: Secondary | ICD-10-CM | POA: Diagnosis not present

## 2018-02-16 DIAGNOSIS — N183 Chronic kidney disease, stage 3 unspecified: Secondary | ICD-10-CM

## 2018-02-16 DIAGNOSIS — R0902 Hypoxemia: Secondary | ICD-10-CM | POA: Diagnosis not present

## 2018-02-16 DIAGNOSIS — Z79899 Other long term (current) drug therapy: Secondary | ICD-10-CM | POA: Diagnosis not present

## 2018-02-16 DIAGNOSIS — D649 Anemia, unspecified: Secondary | ICD-10-CM | POA: Diagnosis not present

## 2018-02-16 DIAGNOSIS — R0600 Dyspnea, unspecified: Secondary | ICD-10-CM | POA: Diagnosis not present

## 2018-02-16 LAB — CMP14+EGFR
ALT: 23 IU/L (ref 0–44)
AST: 21 IU/L (ref 0–40)
Albumin/Globulin Ratio: 1.7 (ref 1.2–2.2)
Albumin: 4 g/dL (ref 3.5–4.8)
Alkaline Phosphatase: 67 IU/L (ref 39–117)
BUN/Creatinine Ratio: 10 (ref 10–24)
BUN: 15 mg/dL (ref 8–27)
Bilirubin Total: 0.5 mg/dL (ref 0.0–1.2)
CO2: 23 mmol/L (ref 20–29)
CREATININE: 1.54 mg/dL — AB (ref 0.76–1.27)
Calcium: 9.5 mg/dL (ref 8.6–10.2)
Chloride: 96 mmol/L (ref 96–106)
GFR calc Af Amer: 52 mL/min/{1.73_m2} — ABNORMAL LOW (ref >59)
GFR, EST NON AFRICAN AMERICAN: 45 mL/min/{1.73_m2} — AB (ref >59)
Globulin, Total: 2.4 g/dL (ref 1.5–4.5)
Glucose: 142 mg/dL — ABNORMAL HIGH (ref 65–99)
Potassium: 4.3 mmol/L (ref 3.5–5.2)
Sodium: 136 mmol/L (ref 134–144)
Total Protein: 6.4 g/dL (ref 6.0–8.5)

## 2018-02-16 LAB — BAYER DCA HB A1C WAIVED: HB A1C (BAYER DCA - WAIVED): 6.5 % (ref <7.0)

## 2018-02-16 NOTE — Progress Notes (Signed)
BP 118/70   Pulse 69   Temp 97.6 F (36.4 C) (Oral)   Ht _0  (1.702 m)   Wt 268 lb 12.8 oz (121.9 kg)   BMI 42.10 kg/m    Subjective:    Patient ID: Kenneth Fuller, male    DOB: 05/20/1945, 73 y.o.   MRN: [id number]  HPI: Kenneth Fuller is a 73 y.o. male presenting on 02/16/2018 for Congestive Heart Failure (1 month follow up. Review labs from 1/6 ) and Nausea (Patient states it has been going on sicne 12/20- phone call in chart )   HPI Type 2 diabetes mellitus Patient comes in today for recheck of his diabetes. Patient has been currently taking glimepiride, he has been having some hypoglycemic episodes down in the 50s, recommended for him to take the glimepiride 2 mg at lunchtime and breakfast instead of taking the full 4 at breakfast to see if that reduces the morning time hypoglycemic episodes, patient did have to stop metformin because of renal function already.. Patient is not currently on an ACE inhibitor/ARB. Patient has not seen an ophthalmologist this year. Patient denies any issues with their feet.   Patient had a recent pancreatitis bout and had a history of gallstones in the past, will do a right upper quadrant ultrasound.  Patient also has increased renal function recently and they did back off on his diuretic for that and stopped his metformin and we will recheck his renal function today, he does have a nephrologist already.  Relevant past medical, surgical, family and social history reviewed and updated as indicated. Interim medical history since our last visit reviewed. Allergies and medications reviewed and updated.  Review of Systems  Constitutional: Negative for chills and fever.  Eyes: Negative for visual disturbance.  Respiratory: Negative for shortness of breath and wheezing.   Cardiovascular: Negative for chest pain and leg swelling.  Musculoskeletal: Negative for arthralgias, back pain and gait problem.  Skin: Negative for rash.  All other systems  reviewed and are negative.   Per HPI unless specifically indicated above   Allergies as of 02/16/2018      Reactions   Amlodipine Besy-benazepril Hcl Swelling, Other (See Comments)   Makes tongue swell (lotrel)   Oxycodone Itching   Phenergan [promethazine Hcl] Other (See Comments)   "I can't remember."   Hydrocodone Itching   Can tolerate in low doses      Medication List       Accurate as of February 16, 2018 10:54 AM. Always use your most recent med list.        albuterol 108 (90 Base) MCG/ACT inhaler Commonly known as:  PROVENTIL HFA;VENTOLIN HFA Inhale 2 puffs into the lungs every 6 (six) hours as needed for wheezing or shortness of breath.   atorvastatin 40 MG tablet Commonly known as:  LIPITOR TAKE 1 TABLET BY MOUTH ONCE DAILY   bisoprolol 5 MG tablet Commonly known as:  ZEBETA Take 0.5 tablets (2.5 mg total) by mouth daily.   budesonide-formoterol 160-4.5 MCG/ACT inhaler Commonly known as:  SYMBICORT Inhale 2 puffs into the lungs 2 (two) times daily.   buPROPion 150 MG 24 hr tablet Commonly known as:  WELLBUTRIN XL Take 150 mg by mouth daily.   clonazePAM 0.5 MG tablet Commonly known as:  KLONOPIN Take 1 tablet (0.5 mg total) by mouth at bedtime as needed for anxiety.   furosemide 40 MG tablet Commonly known as:  LASIX Take 2 tablets (80 mg total) by mouth  every morning. Take extra 40 mg tablet as directed.   glimepiride 4 MG tablet Commonly known as:  AMARYL TAKE 1 TABLET BY MOUTH WITH BREAKFAST   hydrALAZINE 25 MG tablet Commonly known as:  APRESOLINE Take 1 tablet (25 mg total) by mouth 3 (three) times daily.   isosorbide dinitrate 30 MG tablet Commonly known as:  ISORDIL Take 1 tablet (30 mg total) by mouth 3 (three) times daily.   ondansetron 4 MG disintegrating tablet Commonly known as:  ZOFRAN ODT Take 1 tablet (4 mg total) by mouth every 8 (eight) hours as needed for nausea or vomiting.   ONETOUCH DELICA LANCETS 16X Misc 1 each by Does  not apply route daily. Test 1X per day and prn   OXYGEN Inhale 2 L into the lungs at bedtime. 2lpm with sleep   Potassium Chloride ER 20 MEQ Tbcr Take 20 mEq by mouth 2 (two) times daily.   PRODIGY NO CODING BLOOD GLUC test strip Generic drug:  glucose blood CHECK BLOOD SUGARS ONCE DAILY   sertraline 50 MG tablet Commonly known as:  ZOLOFT TAKE 1 TABLET BY MOUTH ONCE DAILY   SYSTANE OP Apply 1 drop to eye daily as needed (for dry eye relief).   VITAMIN D PO Take 5,000 Units by mouth every morning.          Objective:    BP 118/70   Pulse 69   Temp 97.6 F (36.4 C) (Oral)   Ht _0  (1.702 m)   Wt 268 lb 12.8 oz (121.9 kg)   BMI 42.10 kg/m   Wt Readings from Last 3 Encounters:  02/16/18 268 lb 12.8 oz (121.9 kg)  02/13/18 267 lb (121.1 kg)  02/03/18 274 lb (124.3 kg)    Physical Exam Vitals signs and nursing note reviewed.  Constitutional:      General: He is not in acute distress.    Appearance: He is well-developed. He is not diaphoretic.  Eyes:     General: No scleral icterus.    Conjunctiva/sclera: Conjunctivae normal.  Neck:     Musculoskeletal: Neck supple.     Thyroid: No thyromegaly.  Cardiovascular:     Rate and Rhythm: Normal rate and regular rhythm.     Heart sounds: Normal heart sounds. No murmur.  Pulmonary:     Effort: Pulmonary effort is normal. No respiratory distress.     Breath sounds: Normal breath sounds. No wheezing.  Abdominal:     General: Abdomen is flat. Bowel sounds are normal. There is no distension.     Tenderness: There is no abdominal tenderness. There is no guarding or rebound.  Musculoskeletal: Normal range of motion.        General: Swelling (1+ peripheral edema bilaterally) present.  Lymphadenopathy:     Cervical: No cervical adenopathy.  Skin:    General: Skin is warm and dry.     Findings: No rash.  Neurological:     Mental Status: He is alert and oriented to person, place, and time.     Coordination:  Coordination normal.  Psychiatric:        Behavior: Behavior normal.     Results for orders placed or performed in visit on 09/60/45  Basic metabolic panel  Result Value Ref Range   Glucose 91 65 - 99 mg/dL   BUN 23 8 - 27 mg/dL   Creatinine, Ser 1.92 (H) 0.76 - 1.27 mg/dL   GFR calc non Af Amer 34 (L) >59 mL/min/1.73   GFR  calc Af Amer 40 (L) >59 mL/min/1.73   BUN/Creatinine Ratio 12 10 - 24   Sodium 135 134 - 144 mmol/L   Potassium 4.5 3.5 - 5.2 mmol/L   Chloride 92 (L) 96 - 106 mmol/L   CO2 24 20 - 29 mmol/L   Calcium 9.5 8.6 - 10.2 mg/dL  Lipase  Result Value Ref Range   Lipase 65 13 - 78 U/L      Assessment & Plan:   Problem List Items Addressed This Visit      Cardiovascular and Mediastinum   CHF (congestive heart failure) (HCC)     Endocrine   Diabetes mellitus type II, non insulin dependent (HCC)   Relevant Orders   CMP14+EGFR   Bayer DCA Hb A1c Waived     Genitourinary   CKD (chronic kidney disease), stage III (HCC) - Primary   Relevant Orders   CMP14+EGFR    Other Visit Diagnoses    Gallstones       Patient has a history of gallstones and recently had a bout of pancreatitis, will rescan for gallstone to see if it is because   Relevant Orders   US Abdomen Limited RUQ      Patient has a nephrologist already but will follow up with them, we will recheck the renal function today.  Continue to hold the metformin, continue on the glimepiride but split it in half so he takes 2 in the morning and 2 at lunch to hopefully prevent hypoglycemic episodes in the near future.  We will recheck renal function today. Follow up plan: Return in about 3 months (around 05/18/2018), or if symptoms worsen or fail to improve, for Diabetes recheck.  Counseling provided for all of the vaccine components Orders Placed This Encounter  Procedures  . US Abdomen Limited RUQ  . CMP14+EGFR  . Bayer [facility] Hb A1c Waived    Caryl Pina, MD [facility] 02/16/2018, 10:54 AM

## 2018-02-17 ENCOUNTER — Other Ambulatory Visit: Payer: Self-pay | Admitting: Family Medicine

## 2018-02-22 ENCOUNTER — Ambulatory Visit (INDEPENDENT_AMBULATORY_CARE_PROVIDER_SITE_OTHER): Payer: PPO | Admitting: Family Medicine

## 2018-02-22 VITALS — BP 115/67 | HR 81 | Temp 100.3°F | Ht 67.0 in | Wt 268.0 lb

## 2018-02-22 DIAGNOSIS — R062 Wheezing: Secondary | ICD-10-CM | POA: Diagnosis not present

## 2018-02-22 DIAGNOSIS — J101 Influenza due to other identified influenza virus with other respiratory manifestations: Secondary | ICD-10-CM | POA: Diagnosis not present

## 2018-02-22 DIAGNOSIS — R059 Cough, unspecified: Secondary | ICD-10-CM

## 2018-02-22 DIAGNOSIS — R05 Cough: Secondary | ICD-10-CM | POA: Diagnosis not present

## 2018-02-22 LAB — VERITOR FLU A/B WAIVED
INFLUENZA A: POSITIVE — AB
Influenza B: NEGATIVE

## 2018-02-22 MED ORDER — METHYLPREDNISOLONE ACETATE 80 MG/ML IJ SUSP
80.0000 mg | Freq: Once | INTRAMUSCULAR | Status: AC
  Administered 2018-02-22: 80 mg via INTRAMUSCULAR

## 2018-02-22 MED ORDER — BENZONATATE 100 MG PO CAPS: 100.0000 mg | ORAL_CAPSULE | Freq: Three times a day (TID) | ORAL | 0 refills | Status: DC | PRN

## 2018-02-22 MED ORDER — OSELTAMIVIR PHOSPHATE 30 MG PO CAPS: 30.0000 mg | ORAL_CAPSULE | Freq: Two times a day (BID) | ORAL | 0 refills | Status: AC

## 2018-02-22 MED ORDER — PREDNISONE 20 MG PO TABS: 40.0000 mg | ORAL_TABLET | Freq: Every day | ORAL | 0 refills | Status: AC

## 2018-02-22 MED ORDER — IPRATROPIUM-ALBUTEROL 0.5-2.5 (3) MG/3ML IN SOLN
3.0000 mL | Freq: Once | RESPIRATORY_TRACT | Status: AC
  Administered 2018-02-22: 3 mL via RESPIRATORY_TRACT

## 2018-02-22 NOTE — Progress Notes (Signed)
Subjective: CC: Flulike symptoms PCP: [name], [name], MD NOM:VEHMCN D Carelli is a 73 y.o. male presenting to clinic today for:  1.  Flulike symptoms Patient reports abrupt onset of flulike symptoms Monday evening.  He describes cough, wheeze, sore throat, chills and subjective fevers.  He has been using Mucinex but this has not been helping.  He is also used his albuterol with last use this morning.  Cough is not productive.  No hemoptysis.  Blood sugars have been running in the 80s.  He has had his flu shot this year.   ROS: Per HPI  Allergies  Allergen Reactions  . Amlodipine Besy-Benazepril Hcl Swelling and Other (See Comments)    Makes tongue swell (lotrel)  . Oxycodone Itching  . Phenergan [Promethazine Hcl] Other (See Comments)    "I can't remember."  . Hydrocodone Itching    Can tolerate in low doses   Past Medical History:  Diagnosis Date  . Allergy   . Asthma   . CAD (coronary artery disease)   . Diabetes mellitus   . Fibromyalgia   . GERD (gastroesophageal reflux disease)   . GI bleeding   . Gout   . Hyperlipidemia   . Hypertension   . Hypogonadism male   . MRSA cellulitis   . Neuropathy   . Obesity   . Shortness of breath dyspnea    with exertion   . Sleep apnea    cpap- 14   . Wheezing    no asthma diagnosis    Current Outpatient Medications:  .  albuterol (PROVENTIL HFA;VENTOLIN HFA) 108 (90 Base) MCG/ACT inhaler, Inhale 2 puffs into the lungs every 6 (six) hours as needed for wheezing or shortness of breath., Disp: 1 Inhaler, Rfl: 2 .  atorvastatin (LIPITOR) 40 MG tablet, TAKE 1 TABLET BY MOUTH ONCE DAILY, Disp: 90 tablet, Rfl: 0 .  bisoprolol (ZEBETA) 5 MG tablet, Take 0.5 tablets (2.5 mg total) by mouth daily., Disp: 45 tablet, Rfl: 3 .  budesonide-formoterol (SYMBICORT) 160-4.5 MCG/ACT inhaler, Inhale 2 puffs into the lungs 2 (two) times daily., Disp: 1 Inhaler, Rfl: 3 .  buPROPion (WELLBUTRIN XL) 150 MG 24 hr tablet, TAKE 1 TABLET BY  MOUTH ONCE DAILY, Disp: 30 tablet, Rfl: 0 .  Cholecalciferol (VITAMIN D PO), Take 5,000 Units by mouth every morning. , Disp: , Rfl:  .  clonazePAM (KLONOPIN) 0.5 MG tablet, Take 1 tablet (0.5 mg total) by mouth at bedtime as needed for anxiety. (Patient taking differently: Take 0.5 mg by mouth at bedtime. ), Disp: 25 tablet, Rfl: 1 .  furosemide (LASIX) 40 MG tablet, Take 2 tablets (80 mg total) by mouth every morning. Take extra 40 mg tablet as directed., Disp: 270 tablet, Rfl: 3 .  glimepiride (AMARYL) 4 MG tablet, TAKE 1 TABLET BY MOUTH WITH BREAKFAST, Disp: 90 tablet, Rfl: 0 .  hydrALAZINE (APRESOLINE) 25 MG tablet, Take 1 tablet (25 mg total) by mouth 3 (three) times daily., Disp: 270 tablet, Rfl: 3 .  isosorbide dinitrate (ISORDIL) 30 MG tablet, Take 1 tablet (30 mg total) by mouth 3 (three) times daily., Disp: 270 tablet, Rfl: 3 .  ondansetron (ZOFRAN ODT) 4 MG disintegrating tablet, Take 1 tablet (4 mg total) by mouth every 8 (eight) hours as needed for nausea or vomiting., Disp: 20 tablet, Rfl: 0 .  ONETOUCH DELICA LANCETS 47S MISC, 1 each by Does not apply route daily. Test 1X per day and prn, Disp: 100 each, Rfl: 2 .  OXYGEN, Inhale  2 L into the lungs at bedtime. 2lpm with sleep , Disp: , Rfl:  .  Polyethyl Glycol-Propyl Glycol (SYSTANE OP), Apply 1 drop to eye daily as needed (for dry eye relief). , Disp: , Rfl:  .  Potassium Chloride ER 20 MEQ TBCR, Take 20 mEq by mouth 2 (two) times daily., Disp: 30 tablet, Rfl: 1 .  PRODIGY NO CODING BLOOD GLUC test strip, CHECK BLOOD SUGARS ONCE DAILY, Disp: 50 each, Rfl: 11 .  sertraline (ZOLOFT) 50 MG tablet, TAKE 1 TABLET BY MOUTH ONCE DAILY, Disp: 30 tablet, Rfl: 2 Social History   Socioeconomic History  . Marital status: Married    Spouse name: Not on file  . Number of children: 3  . Years of education: Not on file  . Highest education level: Not on file  Occupational History  . Occupation: retired/disability 1999    Employer: DISABLED      Comment: Textiles  Social Needs  . Financial resource strain: Not on file  . Food insecurity:    Worry: Not on file    Inability: Not on file  . Transportation needs:    Medical: Not on file    Non-medical: Not on file  Tobacco Use  . Smoking status: Former Smoker    Last attempt to quit: 02/08/1969    Years since quitting: 49.0  . Smokeless tobacco: Former Network engineer and Sexual Activity  . Alcohol use: No    Alcohol/week: 0.0 standard drinks  . Drug use: No  . Sexual activity: Not on file  Lifestyle  . Physical activity:    Days per week: Not on file    Minutes per session: Not on file  . Stress: Not on file  Relationships  . Social connections:    Talks on phone: Not on file    Gets together: Not on file    Attends religious service: Not on file    Active member of club or organization: Not on file    Attends meetings of clubs or organizations: Not on file    Relationship status: Not on file  . Intimate partner violence:    Fear of current or ex partner: Not on file    Emotionally abused: Not on file    Physically abused: Not on file    Forced sexual activity: Not on file  Other Topics Concern  . Not on file  Social History Narrative   Drinks caffeine tea occasionally    Family History  Problem Relation Age of Onset  . Colon cancer Mother   . Diabetes Father        siblings  . Heart disease Father        brother  . Heart attack Father   . Kidney disease Sister   . Heart failure Sister   . Heart disease Brother   . Heart attack Son 35  . Drug abuse Sister   . Heart disease Brother   . Deep vein thrombosis Brother   . Colon polyps Neg Hx     Objective: Office vital signs reviewed. BP 115/67 (BP Location: Left Arm, Patient Position: Sitting, Cuff Size: Large)   Pulse 81   Temp 100.3 F (37.9 C) (Oral)   Ht _0  (1.702 m)   Wt 268 lb (121.6 kg)   SpO2 95%   BMI 41.97 kg/m   Physical Examination:  General: Awake, alert, ill appearing. No  acute distress HEENT: Normal    Neck: No masses palpated. No lymphadenopathy  Ears: Tympanic membranes intact, normal light reflex, no erythema, no bulging    Eyes: PERRLA, extraocular membranes intact, sclera white    Nose: nasal turbinates moist, clear nasal discharge    Throat: moist mucus membranes, no erythema, no tonsillar exudate.  Airway is patent Cardio: regular rate and rhythm, S1S2 heard, no murmurs appreciated Pulm: Global expiratory wheezes.  Air movement good. No rhonchi or rales; normal work of breathing on room air; coughing intermittently  Results for orders placed or performed in visit on 02/22/18 (from the past 24 hour(s))  Veritor Flu A/B Waived     Status: Abnormal   Collection Time: 02/22/18  6:08 PM  Result Value Ref Range   Influenza A Positive (A) Negative   Influenza B Negative Negative   Narrative   Performed at:  [facility] [address], [place], [place]  [id number] Lab Director: Colletta [place] [facility], Phone:  [phone]     Assessment/ Plan: 73 y.o. male   1. Influenza A Patient is febrile and ill-appearing.  His physical exam was remarkable for global expiratory wheezes.  I suspect exacerbation of COPD related to influenza infection.  He was positive for influenza A here in office.  He was given a dose of Depo-Medrol 80 and also provided a DuoNeb here in office.  I have advised him to use his albuterol inhaler 2 puffs every 6 hours scheduled for the next 2 days.  Tamiflu also renally dose today to 30 mg twice daily for the next 5 days.  Tessalon Perles also prescribed for cough.  Home care instructions were reviewed and reasons for return discussed.  He will follow-up PRN. - methylPREDNISolone acetate (DEPO-MEDROL) injection 80 mg - ipratropium-albuterol (DUONEB) 0.5-2.5 (3) MG/3ML nebulizer solution 3 mL  2. Cough - Veritor Flu A/B Waived - methylPREDNISolone acetate (DEPO-MEDROL) injection 80 mg - ipratropium-albuterol (DUONEB) 0.5-2.5  (3) MG/3ML nebulizer solution 3 mL  3. Wheezing - ipratropium-albuterol (DUONEB) 0.5-2.5 (3) MG/3ML nebulizer solution 3 mL   Orders Placed This Encounter  Procedures  . Veritor Flu A/B Waived    Order Specific Question:   Source    Answer:   cough   Meds ordered this encounter  Medications  . benzonatate (TESSALON PERLES) 100 MG capsule    Sig: Take 1 capsule (100 mg total) by mouth 3 (three) times daily as needed for cough.    Dispense:  20 capsule    Refill:  0  . predniSONE (DELTASONE) 20 MG tablet    Sig: Take 2 tablets (40 mg total) by mouth daily with breakfast for 5 days.    Dispense:  10 tablet    Refill:  0  . oseltamivir (TAMIFLU) 30 MG capsule    Sig: Take 1 capsule (30 mg total) by mouth 2 (two) times daily for 5 days.    Dispense:  10 capsule    Refill:  0  . methylPREDNISolone acetate (DEPO-MEDROL) injection 80 mg  . ipratropium-albuterol (DUONEB) 0.5-2.5 (3) MG/3ML nebulizer solution 3 mL     Janora Norlander, DO [place] [phone]

## 2018-02-22 NOTE — Patient Instructions (Addendum)
I have prescribed you Tamiflu 30 mg to take twice daily for the next 5 days for influenza.  I have also prescribed you prednisone burst I would like you to start tomorrow.  I think that you are having some bronchial spasm and flare of your COPD related to the virus.  If your symptoms worsen or do not improve, please seek immediate medical attention.   Influenza, Adult Influenza is also called "the flu." It is an infection in the lungs, nose, and throat (respiratory tract). It is caused by a virus. The flu causes symptoms that are similar to symptoms of a cold. It also causes a high fever and body aches. The flu spreads easily from person to person (is contagious). Getting a flu shot (influenza vaccination) every year is the best way to prevent the flu. What are the causes? This condition is caused by the influenza virus. You can get the virus by:  Breathing in droplets that are in the air from the cough or sneeze of a person who has the virus.  Touching something that has the virus on it (is contaminated) and then touching your mouth, nose, or eyes. What increases the risk? Certain things may make you more likely to get the flu. These include:  Not washing your hands often.  Having close contact with many people during cold and flu season.  Touching your mouth, eyes, or nose without first washing your hands.  Not getting a flu shot every year. You may have a higher risk for the flu, along with serious problems such as a lung infection (pneumonia), if you:  Are older than 65.  Are pregnant.  Have a weakened disease-fighting system (immune system) because of a disease or taking certain medicines.  Have a long-term (chronic) illness, such as: ? Heart, kidney, or lung disease. ? Diabetes. ? Asthma.  Have a liver disorder.  Are very overweight (morbidly obese).  Have anemia. This is a condition that affects your red blood cells. What are the signs or symptoms? Symptoms usually  begin suddenly and last 4-14 days. They may include:  Fever and chills.  Headaches, body aches, or muscle aches.  Sore throat.  Cough.  Runny or stuffy (congested) nose.  Chest discomfort.  Not wanting to eat as much as normal (poor appetite).  Weakness or feeling tired (fatigue).  Dizziness.  Feeling sick to your stomach (nauseous) or throwing up (vomiting). How is this treated? If the flu is found early, you can be treated with medicine that can help reduce how bad the illness is and how long it lasts (antiviral medicine). This may be given by mouth (orally) or through an IV tube. Taking care of yourself at home can help your symptoms get better. Your doctor may suggest:  Taking over-the-counter medicines.  Drinking plenty of fluids. The flu often goes away on its own. If you have very bad symptoms or other problems, you may be treated in a hospital. Follow these instructions at home:     Activity  Rest as needed. Get plenty of sleep.  Stay home from work or school as told by your doctor. ? Do not leave home until you do not have a fever for 24 hours without taking medicine. ? Leave home only to visit your doctor. Eating and drinking  Take an ORS (oral rehydration solution). This is a drink that is sold at pharmacies and stores.  Drink enough fluid to keep your pee (urine) pale yellow.  Drink clear fluids in  small amounts as you are able. Clear fluids include: ? Water. ? Ice chips. ? Fruit juice that has water added (diluted fruit juice). ? Low-calorie sports drinks.  Eat bland, easy-to-digest foods in small amounts as you are able. These foods include: ? Bananas. ? Applesauce. ? Rice. ? Lean meats. ? Toast. ? Crackers.  Do not eat or drink: ? Fluids that have a lot of sugar or caffeine. ? Alcohol. ? Spicy or fatty foods. General instructions  Take over-the-counter and prescription medicines only as told by your doctor.  Use a cool mist humidifier  to add moisture to the air in your home. This can make it easier for you to breathe.  Cover your mouth and nose when you cough or sneeze.  Wash your hands with soap and water often, especially after you cough or sneeze. If you cannot use soap and water, use alcohol-based hand sanitizer.  Keep all follow-up visits as told by your doctor. This is important. How is this prevented?   Get a flu shot every year. You may get the flu shot in late summer, fall, or winter. Ask your doctor when you should get your flu shot.  Avoid contact with people who are sick during fall and winter (cold and flu season). Contact a doctor if:  You get new symptoms.  You have: ? Chest pain. ? Watery poop (diarrhea). ? A fever.  Your cough gets worse.  You start to have more mucus.  You feel sick to your stomach.  You throw up. Get help right away if you:  Have shortness of breath.  Have trouble breathing.  Have skin or nails that turn a bluish color.  Have very bad pain or stiffness in your neck.  Get a sudden headache.  Get sudden pain in your face or ear.  Cannot eat or drink without throwing up. Summary  Influenza ("the flu") is an infection in the lungs, nose, and throat. It is caused by a virus.  Take over-the-counter and prescription medicines only as told by your doctor.  Getting a flu shot every year is the best way to avoid getting the flu. This information is not intended to replace advice given to you by your health care provider. Make sure you discuss any questions you have with your health care provider. Document Released: 11/04/2007 Document Revised: 07/13/2017 Document Reviewed: 07/13/2017 Elsevier Interactive Patient Education  2019 [employer].

## 2018-02-23 ENCOUNTER — Telehealth: Payer: Self-pay | Admitting: Family Medicine

## 2018-02-23 NOTE — Telephone Encounter (Signed)
Spoke with pt- he is concerned about rapid spike in blood sugar after starting prednisone.  Advised him this is a normal response the prednisone with diabetes.  Advised him to drink plenty of water, avoid foods and drinks with sugar especially while taking prednisone.  Advised him to check blood sugars a couple times tomorrow and call to let us know what they are running.  Spoke with Dr. Evette Doffing, she agrees with plan.

## 2018-02-24 NOTE — Telephone Encounter (Signed)
I agree, if he continues to run high then we may have to give him something temporarily help but for now just keep a close eye on it.

## 2018-02-24 NOTE — Telephone Encounter (Signed)
Pt aware - this morning was 131.

## 2018-02-28 ENCOUNTER — Ambulatory Visit (HOSPITAL_COMMUNITY)
Admission: RE | Admit: 2018-02-28 | Discharge: 2018-02-28 | Disposition: A | Discharge status: Home / Self Care | Payer: PPO | Source: Ambulatory Visit | Attending: Family Medicine | Admitting: Family Medicine

## 2018-02-28 DIAGNOSIS — K8020 Calculus of gallbladder without cholecystitis without obstruction: Secondary | ICD-10-CM | POA: Diagnosis not present

## 2018-02-28 DIAGNOSIS — K859 Acute pancreatitis without necrosis or infection, unspecified: Secondary | ICD-10-CM | POA: Diagnosis not present

## 2018-03-13 ENCOUNTER — Other Ambulatory Visit: Payer: Self-pay | Admitting: Pharmacy Technician

## 2018-03-13 NOTE — Patient Outreach (Signed)
[facility]) Care Management  03/13/2018  Kenneth Fuller [birth year] [id number]    Follow up call placed to [employer] regarding patient assistance application(s) for [facility] and Proventil HFA , Inrene confirms that application was received and attestation form was mailed out to patient on 1/31.    Successful call placed to patient regarding patient assistance update for [facility] and Proventil HFA, HIPAA identifiers verified. Informed [name] of attestation form being mailed out and requested that he contact me when he receives it so that I may assist him with filling it out. Confirmed that he has my contact phone number.  Will follow up with patient in 3-5 business days if he has not contacted me.  Maud Deed Chana Bode Saunemin Certified Pharmacy Technician [place] [facility] Management Direct [phone]

## 2018-03-17 ENCOUNTER — Other Ambulatory Visit: Payer: Self-pay | Admitting: Family Medicine

## 2018-03-19 DIAGNOSIS — R0600 Dyspnea, unspecified: Secondary | ICD-10-CM | POA: Diagnosis not present

## 2018-03-19 DIAGNOSIS — R0902 Hypoxemia: Secondary | ICD-10-CM | POA: Diagnosis not present

## 2018-03-20 ENCOUNTER — Other Ambulatory Visit: Payer: Self-pay | Admitting: Pharmacy Technician

## 2018-03-20 NOTE — Patient Outreach (Signed)
[place]) Care Management  03/20/2018  Daemyn Gariepy Esterly 03-Feb-1946 [id number]    Unsuccessful call #1 placed to patient regarding patient assistance application(s) for [facility] and Proventil HFA , detailed voicemail left. Calling to confirm verify attestation form from [employer] patient assistance has been received.  Will make 2nd call attempt in 2-3 business days if call has not been returned.  Maud Deed Chana Bode [place] Certified Pharmacy Technician [place] Management Direct [phone]

## 2018-03-22 ENCOUNTER — Telehealth: Payer: Self-pay | Admitting: *Deleted

## 2018-03-22 NOTE — Telephone Encounter (Signed)
Left message for patient to call and schedule March 2020 appointment with Almyra Deforest, PA

## 2018-03-23 DIAGNOSIS — H04123 Dry eye syndrome of bilateral lacrimal glands: Secondary | ICD-10-CM | POA: Diagnosis not present

## 2018-03-23 DIAGNOSIS — E119 Type 2 diabetes mellitus without complications: Secondary | ICD-10-CM | POA: Diagnosis not present

## 2018-03-23 DIAGNOSIS — H2513 Age-related nuclear cataract, bilateral: Secondary | ICD-10-CM | POA: Diagnosis not present

## 2018-03-23 DIAGNOSIS — Z7984 Long term (current) use of oral hypoglycemic drugs: Secondary | ICD-10-CM | POA: Diagnosis not present

## 2018-03-23 LAB — HM DIABETES EYE EXAM

## 2018-04-05 ENCOUNTER — Telehealth: Payer: Self-pay | Admitting: Family Medicine

## 2018-04-05 NOTE — Telephone Encounter (Signed)
LM due for AWV

## 2018-04-06 ENCOUNTER — Other Ambulatory Visit: Payer: Self-pay | Admitting: Pharmacy Technician

## 2018-04-06 NOTE — Patient Outreach (Signed)
[place]) Care Management  04/06/2018  Marwin Primmer Achenbach 1945-10-29 [id number]    Follow up call placed to [employer] regarding patient assistance application(s) for [facility] and Proventil HFA , Ralph Leyden confirms patient has been approved as of 2/20 until 02/08/2019. Medication should arrive at patient home in 10-14 business days from the 20th.    Successful call placed to patient regarding patient assistance update for [facility] and Proventil HFA, HIPAA identifiers verified. Informed patient of details above.  Will follow up with patient in 10-14 business days to confirm medication has been received.  Maud Deed Chana Bode [place] Certified Pharmacy Technician [place] Management Direct Dial:[phone]

## 2018-04-17 DIAGNOSIS — R0902 Hypoxemia: Secondary | ICD-10-CM | POA: Diagnosis not present

## 2018-04-17 DIAGNOSIS — R0600 Dyspnea, unspecified: Secondary | ICD-10-CM | POA: Diagnosis not present

## 2018-04-18 ENCOUNTER — Other Ambulatory Visit: Payer: Self-pay | Admitting: Family Medicine

## 2018-04-18 MED ORDER — ACCU-CHEK SOFT TOUCH LANCETS MISC: 1.0000 | Freq: Every day | 12 refills | Status: DC

## 2018-04-18 MED ORDER — ACCU-CHEK AVIVA DEVI: 0 refills | Status: AC

## 2018-04-18 MED ORDER — GLUCOSE BLOOD VI STRP: 1.0000 | ORAL_STRIP | Freq: Every day | 12 refills | Status: DC

## 2018-04-18 NOTE — Telephone Encounter (Signed)
Glucometer sent and patient aware

## 2018-04-19 ENCOUNTER — Ambulatory Visit: Payer: PPO | Admitting: Family Medicine

## 2018-04-24 ENCOUNTER — Other Ambulatory Visit: Payer: Self-pay

## 2018-04-24 ENCOUNTER — Ambulatory Visit (INDEPENDENT_AMBULATORY_CARE_PROVIDER_SITE_OTHER): Payer: PPO | Admitting: Family Medicine

## 2018-04-24 ENCOUNTER — Encounter: Payer: Self-pay | Admitting: Family Medicine

## 2018-04-24 ENCOUNTER — Encounter: Payer: Self-pay | Admitting: *Deleted

## 2018-04-24 ENCOUNTER — Other Ambulatory Visit: Payer: Self-pay | Admitting: Family Medicine

## 2018-04-24 VITALS — BP 135/69 | HR 73 | Temp 97.5°F | Ht 67.0 in | Wt 265.4 lb

## 2018-04-24 DIAGNOSIS — R6889 Other general symptoms and signs: Secondary | ICD-10-CM

## 2018-04-24 DIAGNOSIS — J441 Chronic obstructive pulmonary disease with (acute) exacerbation: Secondary | ICD-10-CM

## 2018-04-24 LAB — VERITOR FLU A/B WAIVED
Influenza A: NEGATIVE
Influenza B: NEGATIVE

## 2018-04-24 MED ORDER — AMOXICILLIN-POT CLAVULANATE 875-125 MG PO TABS: 1.0000 | ORAL_TABLET | Freq: Two times a day (BID) | ORAL | 0 refills | Status: DC

## 2018-04-24 MED ORDER — PREDNISONE 20 MG PO TABS: ORAL_TABLET | ORAL | 0 refills | Status: DC

## 2018-04-24 NOTE — Addendum Note (Signed)
Addended by: Caryl Pina on: 04/24/2018 03:42 PM   Modules accepted: Orders

## 2018-04-24 NOTE — Addendum Note (Signed)
Addended by: Caryl Pina on: 04/24/2018 03:26 PM   Modules accepted: Orders

## 2018-04-24 NOTE — Progress Notes (Signed)
BP 135/69   Pulse 73   Temp (!) 97.5 F (36.4 C) (Oral)   Ht _0  (1.702 m)   Wt 265 lb 6.4 oz (120.4 kg)   SpO2 92%   BMI 41.57 kg/m    Subjective:    Patient ID: BENEDICTO CAPOZZI, male    DOB: [birth year], 73 y.o.   MRN: [id number]  HPI: NICKOLAS CHALFIN is a 73 y.o. male presenting on 04/24/2018 for Sore Throat (x 2 days); Cough; Chills; and Generalized Body Aches   HPI Patient comes in today complaining of constant congestion and sore throat and chills and body aches and subjective fevers that have been going on for the past 2 days.  Patient does have known lung disease and cardiac disease.  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  Patient complains of increasingly short of breath and tightness and a cough that is productive.  He has not traveled anywhere outside the county but he does work as a Theme park manager and sees multiple people on a regular basis.  Relevant past medical, surgical, family and social history reviewed and updated as indicated. Interim medical history since our last visit reviewed. Allergies and medications reviewed and updated.  Review of Systems  Constitutional: Positive for fever. Negative for chills.  HENT: Positive for congestion, postnasal drip, rhinorrhea, sinus pressure, sneezing and sore throat. Negative for ear discharge, ear pain and voice change.   Eyes: Negative for pain, discharge, redness and visual disturbance.  Respiratory: Positive for cough, shortness of breath and wheezing.   Cardiovascular: Negative for chest pain and leg swelling.  Musculoskeletal: Negative for gait problem.  Skin: Negative for rash.  All other systems reviewed and are negative.   Per HPI unless specifically indicated above   Allergies as of 04/24/2018      Reactions   Amlodipine Besy-benazepril Hcl Swelling, Other (See Comments)   Makes tongue swell (lotrel)   Oxycodone Itching   Phenergan  [promethazine Hcl] Other (See Comments)   "I can't remember."   Hydrocodone Itching   Can tolerate in low doses      Medication List       Accurate as of April 24, 2018 12:58 PM. Always use your most recent med list.        Accu-Chek Aviva device Use as instructed   albuterol 108 (90 Base) MCG/ACT inhaler Commonly known as:  PROVENTIL HFA;VENTOLIN HFA Inhale 2 puffs into the lungs every 6 (six) hours as needed for wheezing or shortness of breath.   amoxicillin-clavulanate 875-125 MG tablet Commonly known as:  AUGMENTIN Take 1 tablet by mouth 2 (two) times daily.   atorvastatin 40 MG tablet Commonly known as:  LIPITOR TAKE 1 TABLET BY MOUTH ONCE DAILY   bisoprolol 5 MG tablet Commonly known as:  ZEBETA Take 0.5 tablets (2.5 mg total) by mouth daily.   budesonide-formoterol 160-4.5 MCG/ACT inhaler Commonly known as:  Symbicort Inhale 2 puffs into the lungs 2 (two) times daily.   buPROPion 150 MG 24 hr tablet Commonly known as:  WELLBUTRIN XL TAKE 1 TABLET BY MOUTH ONCE DAILY   clonazePAM 0.5 MG tablet Commonly known as:  KLONOPIN Take 1 tablet (0.5 mg total) by mouth at bedtime as needed for anxiety.   furosemide 40 MG tablet Commonly known as:  LASIX Take 2 tablets (80 mg total) by mouth every morning. Take extra 40 mg tablet as directed.  glimepiride 4 MG tablet Commonly known as:  AMARYL TAKE 1 TABLET BY MOUTH WITH BREAKFAST   hydrALAZINE 25 MG tablet Commonly known as:  APRESOLINE Take 1 tablet (25 mg total) by mouth 3 (three) times daily.   isosorbide dinitrate 30 MG tablet Commonly known as:  ISORDIL Take 1 tablet (30 mg total) by mouth 3 (three) times daily.   ondansetron 4 MG disintegrating tablet Commonly known as:  Zofran ODT Take 1 tablet (4 mg total) by mouth every 8 (eight) hours as needed for nausea or vomiting.   OneTouch Delica Lancets 29V Misc 1 each by Does not apply route daily. Test 1X per day and prn   accu-chek soft touch lancets  1 each by Other route daily. Use as instructed   OXYGEN Inhale 2 L into the lungs at bedtime. 2lpm with sleep   Potassium Chloride ER 20 MEQ Tbcr Take 20 mEq by mouth 2 (two) times daily.   predniSONE 20 MG tablet Commonly known as:  DELTASONE 2 po at same time daily for 5 days   Prodigy No Coding Blood Gluc test strip Generic drug:  glucose blood CHECK BLOOD SUGARS ONCE DAILY   glucose blood test strip Commonly known as:  Accu-Chek Aviva 1 each by Other route daily. Use as instructed   sertraline 50 MG tablet Commonly known as:  ZOLOFT TAKE 1 TABLET BY MOUTH ONCE DAILY   SYSTANE OP Apply 1 drop to eye daily as needed (for dry eye relief).   VITAMIN D PO Take 5,000 Units by mouth every morning.          Objective:    BP 135/69   Pulse 73   Temp (!) 97.5 F (36.4 C) (Oral)   Ht _0  (1.702 m)   Wt 265 lb 6.4 oz (120.4 kg)   SpO2 92%   BMI 41.57 kg/m   Wt Readings from Last 3 Encounters:  04/24/18 265 lb 6.4 oz (120.4 kg)  02/22/18 268 lb (121.6 kg)  02/16/18 268 lb 12.8 oz (121.9 kg)    Physical Exam Vitals signs and nursing note reviewed.  Constitutional:      General: He is not in acute distress.    Appearance: He is well-developed. He is not diaphoretic.  HENT:     Right Ear: Tympanic membrane, ear canal and external ear normal.     Left Ear: Tympanic membrane, ear canal and external ear normal.     Nose: Mucosal edema and rhinorrhea present.     Right Sinus: Maxillary sinus tenderness present. No frontal sinus tenderness.     Left Sinus: Maxillary sinus tenderness present. No frontal sinus tenderness.     Mouth/Throat:     Pharynx: Uvula midline. Posterior oropharyngeal erythema present. No oropharyngeal exudate.     Tonsils: No tonsillar abscesses.  Eyes:     General: No scleral icterus.       Right eye: No discharge.     Conjunctiva/sclera: Conjunctivae normal.     Pupils: Pupils are equal, round, and reactive to light.  Neck:      Musculoskeletal: Neck supple.     Thyroid: No thyromegaly.  Cardiovascular:     Rate and Rhythm: Normal rate and regular rhythm.     Heart sounds: Normal heart sounds. No murmur.  Pulmonary:     Effort: Pulmonary effort is normal. No respiratory distress.     Breath sounds: No stridor. Wheezing and rhonchi present. No rales.  Chest:     Chest wall:  No tenderness.  Musculoskeletal: Normal range of motion.  Lymphadenopathy:     Cervical: No cervical adenopathy.  Skin:    General: Skin is warm and dry.     Findings: No rash.  Neurological:     Mental Status: He is alert and oriented to person, place, and time.     Coordination: Coordination normal.  Psychiatric:        Behavior: Behavior normal.         Assessment & Plan:   Problem List Items Addressed This Visit    None    Visit Diagnoses    COPD exacerbation ([place])    -  Primary   Relevant Medications   predniSONE (DELTASONE) 20 MG tablet   amoxicillin-clavulanate (AUGMENTIN) 875-125 MG tablet   Other Relevant Orders   Coronavirus CoVID-19 ([facility])   Novel Coronavirus, NAA ([facility])   Flu-like symptoms       Relevant Medications   predniSONE (DELTASONE) 20 MG tablet   amoxicillin-clavulanate (AUGMENTIN) 875-125 MG tablet   Other Relevant Orders   Veritor Flu A/B Waived (Completed)   Coronavirus CoVID-19 ([facility])   Novel Coronavirus, NAA ([facility])      We contacted the county health and their triage nurse let us know that because the patient is high risk 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  Counseling provided for all of the vaccine components Orders Placed This Encounter  Procedures  . Coronavirus CoVID-19 ([facility])  . Novel Coronavirus, NAA ([facility])  . Veritor Flu A/B Ulyses [place], MD [facility] 04/24/2018, 12:58 PM

## 2018-04-25 ENCOUNTER — Ambulatory Visit: Payer: Self-pay | Admitting: Licensed Clinical Social Worker

## 2018-04-25 DIAGNOSIS — N183 Chronic kidney disease, stage 3 unspecified: Secondary | ICD-10-CM

## 2018-04-25 DIAGNOSIS — K219 Gastro-esophageal reflux disease without esophagitis: Secondary | ICD-10-CM

## 2018-04-25 DIAGNOSIS — J441 Chronic obstructive pulmonary disease with (acute) exacerbation: Secondary | ICD-10-CM

## 2018-04-25 DIAGNOSIS — E1142 Type 2 diabetes mellitus with diabetic polyneuropathy: Secondary | ICD-10-CM

## 2018-04-25 DIAGNOSIS — F411 Generalized anxiety disorder: Secondary | ICD-10-CM

## 2018-04-25 NOTE — Chronic Care Management (AMB) (Signed)
  Care Management Note   Kenneth Fuller is a 73 y.o. year old male who is a primary care patient of [name], [name], MD. The CM team was consulted for assistance with chronic disease management and care coordination.   I reached out to [name] by phone today.   [name] was given information about Chronic Care Management services today including:  1. CCM service includes personalized support from designated clinical staff supervised by his physician, including individualized plan of care and coordination with other care providers 2. 24/7 contact phone numbers for assistance for urgent and routine care needs. 3. Service will only be billed when office clinical staff spend 20 minutes or more in a month to coordinate care. 4. Only one practitioner may furnish and bill the service in a calendar month. 5. The patient may stop CCM services at any time (effective at the end of the month) by phone call to the office staff. 6. The patient will be responsible for cost sharing (co-pay) of up to 20% of the service fee (after annual deductible is met). Patient did not agree to services and wishes to consider information provided before deciding about enrollment in CCM services.    Review of patient status, including review of consultants reports, relevant laboratory and other test results, and collaboration with appropriate care team members and the patient's provider was performed as part of comprehensive patient evaluation and provision of chronic care management services.   Follow Up Plan: LCSW spoke with client about LCSW services and RN CM services as part of CCM program. Client agreed for RN CM [name] to return call to him to talk further with him about CCM nursing services available. RN CM to call client in next 2 weeks to further discuss CCM program services with client  [name].Eban Weick MSW, LCSW Licensed Clinical Social Worker [facility]  Care Management [phone]

## 2018-04-25 NOTE — Patient Instructions (Signed)
Licensed Clinical Social Worker Visit Information  Materials provided: No  Mr. Cavallero was given information about Chronic Care Management services today including:  1. CCM service includes personalized support from designated clinical staff supervised by his physician, including individualized plan of care and coordination with other care providers 2. 24/7 contact phone numbers for assistance for urgent and routine care needs. 3. Service will only be billed when office clinical staff spend 20 minutes or more in a month to coordinate care. 4. Only one practitioner may furnish and bill the service in a calendar month. 5. The patient may stop CCM services at any time (effective at the end of the month) by phone call to the office staff. 6. The patient will be responsible for cost sharing (co-pay) of up to 20% of the service fee (after annual deductible is met).  Patient did not agree to services and wishes to consider information provided before deciding about enrollment in CCM services.    Follow Up Plan:  RNCM Kristen Hudy to call client in next 2 weeks to talk further with client about CCM nursing services available  The patient verbalized understanding of instructions provided today and declined a print copy of patient instruction materials.   [name].Hallis Meditz MSW, LCSW Licensed Clinical Social Worker [facility] Care Management [phone]

## 2018-04-26 ENCOUNTER — Telehealth: Payer: PPO | Admitting: *Deleted

## 2018-04-27 LAB — NOVEL CORONAVIRUS, NAA: SARS-CoV-2, NAA: NOT DETECTED

## 2018-04-28 ENCOUNTER — Telehealth: Payer: Self-pay | Admitting: Family Medicine

## 2018-04-28 ENCOUNTER — Telehealth: Payer: Self-pay | Admitting: Cardiovascular Disease

## 2018-04-28 ENCOUNTER — Encounter: Payer: Self-pay | Admitting: Family Medicine

## 2018-04-28 MED ORDER — PREDNISONE 20 MG PO TABS: ORAL_TABLET | ORAL | 0 refills | Status: DC

## 2018-04-28 NOTE — Telephone Encounter (Signed)
New Message    Pt is calling about his upcoming appt. He did say he has had an upper respiratory infection this week  But he does not want to cancel his appointment at this time   Please call

## 2018-04-28 NOTE — Telephone Encounter (Signed)
Spoke with Pt as well As Dr Building control surveyor.  Per Dr D - will will send in prednisone for pt.

## 2018-04-28 NOTE — Telephone Encounter (Signed)
Message sent to Dr.Croitoru  through Epic messenger to advise. Awaiting MD response.

## 2018-04-28 NOTE — Telephone Encounter (Signed)
I sent in prednisone for him

## 2018-05-01 NOTE — Telephone Encounter (Signed)
Spoke with the pt. Pt sts that he is doing well from a cardiac standpoint, and agreeable with cancelling his 05/04/18 appt. With Almyra Deforest, PA. Per COVID-19 protocol cardiac questionnaire answered. Adv pt that he will be contacted to have his appt rescheduled. He understands to contact the office sooner if cardiac symptoms develop.     Cardiac Questionnaire:    Since your last visit or hospitalization:    1. Have you been having new or worsening chest pain? No   2. Have you been having new or worsening shortness of breath? No 3. Have you been having new or worsening leg swelling, wt gain, or increase in abdominal girth (pants fitting more tightly)? No   4. Have you had any passing out spells? No   _____________   EAKLT-07 Pre-Screening Questions:  . Do you currently have a fever? No (yes = cancel and refer to pcp for e-visit) . Have you recently travelled on a cruise, internationally, or to [place], [place], [place], [place], [place], or [place], [place] [employer]) ? No (yes = cancel, stay home, monitor symptoms, and contact pcp or initiate e-visit if symptoms develop) . Have you been in contact with someone that is currently pending confirmation of Covid19 testing or has been confirmed to have the [place] virus?  No (yes = cancel, stay home, away from tested individual, monitor symptoms, and contact pcp or initiate e-visit if symptoms develop) Are you currently experiencing fatigue or cough? yes                    Primary Cardiologist: Dr.Croitoru  Patient contacted.  History reviewed.  No symptoms to suggest any unstable cardiac conditions.  Based on discussion, with current pandemic situation, we will be postponing this appointment for _0 @.  If symptoms change, he has been instructed to contact our office.   Routing to C19 CANCEL pool for tracking (P CV DIV CV19 CANCEL) and assigning priority (1 = 4-6 wks, 2 = 6-12 wks, 3 = >12 wks).  Lamar Laundry, RN  05/01/2018 8:50 AM          .

## 2018-05-02 ENCOUNTER — Encounter: Payer: Self-pay | Admitting: Family Medicine

## 2018-05-02 ENCOUNTER — Other Ambulatory Visit: Payer: Self-pay | Admitting: Pharmacy Technician

## 2018-05-02 NOTE — Patient Outreach (Signed)
[facility]) Care Management  05/02/2018  Kenneth Fuller [birth year] [id number]    Successful call placed to patient regarding patient assistance medication receipt from [place], HIPAA identifiers verified. [name] confirms her received his Dulera and Proventil HFA inhalers. Reviewed with patient how to obtain refills and requested he contact me if he runs into any issues. He stated he would and does not currently have any additional questions.  Follow up:  Will route note to [place] for case closure  [employer]. Chana Bode [place] Certified Pharmacy Technician [place] Management Direct Dial:[phone]

## 2018-05-04 ENCOUNTER — Ambulatory Visit: Payer: PPO | Admitting: Physician Assistant

## 2018-05-04 ENCOUNTER — Other Ambulatory Visit: Payer: Self-pay | Admitting: Pharmacist

## 2018-05-04 NOTE — Patient Outreach (Signed)
Kenneth Fuller  05/04/2018  Kenneth Fuller 08-May-1945 [id number]  Received message from pharmacy technician Caryl Pina, patient received his patient assistance supplied [facility] and Proventil inhalers from manufacturer patient assistance program.    Plan:  Pharmacy episode closed.  Dr [name] made aware via in-basket message patient received patient assistance.   Karrie Meres, PharmD, [place] [phone]

## 2018-05-05 ENCOUNTER — Other Ambulatory Visit: Payer: Self-pay | Admitting: Family Medicine

## 2018-05-10 ENCOUNTER — Telehealth: Payer: PPO

## 2018-05-13 ENCOUNTER — Other Ambulatory Visit: Payer: Self-pay | Admitting: Family Medicine

## 2018-05-14 ENCOUNTER — Other Ambulatory Visit: Payer: Self-pay | Admitting: Family Medicine

## 2018-05-14 DIAGNOSIS — F339 Major depressive disorder, recurrent, unspecified: Secondary | ICD-10-CM

## 2018-05-14 DIAGNOSIS — F411 Generalized anxiety disorder: Secondary | ICD-10-CM

## 2018-05-15 ENCOUNTER — Telehealth: Payer: PPO | Admitting: *Deleted

## 2018-05-16 ENCOUNTER — Ambulatory Visit: Payer: Self-pay | Admitting: Licensed Clinical Social Worker

## 2018-05-16 ENCOUNTER — Telehealth: Payer: Self-pay

## 2018-05-16 ENCOUNTER — Other Ambulatory Visit: Payer: Self-pay

## 2018-05-16 DIAGNOSIS — F411 Generalized anxiety disorder: Secondary | ICD-10-CM

## 2018-05-16 DIAGNOSIS — K219 Gastro-esophageal reflux disease without esophagitis: Secondary | ICD-10-CM

## 2018-05-16 DIAGNOSIS — E1142 Type 2 diabetes mellitus with diabetic polyneuropathy: Secondary | ICD-10-CM

## 2018-05-16 DIAGNOSIS — N183 Chronic kidney disease, stage 3 unspecified: Secondary | ICD-10-CM

## 2018-05-16 DIAGNOSIS — J441 Chronic obstructive pulmonary disease with (acute) exacerbation: Secondary | ICD-10-CM

## 2018-05-16 NOTE — Telephone Encounter (Signed)
Virtual Visit Pre-Appointment Phone Call  Kenneth Fuller has been deemed a candidate for a follow-up tele-health visit to limit community exposure during the Covid-19 pandemic. I spoke with the patient via phone to ensure availability of phone/video source, confirm preferred email & phone number, and discuss instructions and expectations.  I reminded Kenneth Fuller to be prepared with any vital sign and/or heart rhythm information that could potentially be obtained via home monitoring, at the time of his visit. I reminded Kenneth Fuller to expect a phone call at the time of his visit if his visit.  Did the patient verbally acknowledge consent to treatment? Patient provided verbal consent. Consent information sent to mychart.  Kenneth Fuller, Kenneth Fuller, Kenneth Fuller 05/16/2018 3:01 PM   DOWNLOADING THE [place]  - If Apple, go to [employer] and type in WebEx in the search bar. Kenneth Fuller Hotels, the blue/green circle. The app is free but as with any other app downloads, their phone may require them to verify saved payment information or Apple password. The patient does NOT have to create an account.  - If Android, ask patient to go to [employer] and type in WebEx in the search bar. Kenneth Fuller Hotels, the blue/green circle. The app is free but as with any other app downloads, their phone may require them to verify saved payment information or Android password. The patient does NOT have to create an account.   CONSENT FOR TELE-HEALTH VISIT - PLEASE REVIEW  I hereby voluntarily request, consent and authorize [facility] and its employed or contracted physicians, physician assistants, nurse practitioners or other licensed health care professionals (the Practitioner), to provide me with telemedicine health care services (the "Services") as deemed necessary by the treating Practitioner. I acknowledge and consent to receive the Services by the  Practitioner via telemedicine. I understand that the telemedicine visit will involve communicating with the Practitioner through live audiovisual communication technology and the disclosure of certain medical information by electronic transmission. I acknowledge that I have been given the opportunity to request an in-person assessment or other available alternative prior to the telemedicine visit and am voluntarily participating in the telemedicine visit.  I understand that I have the right to withhold or withdraw my consent to the use of telemedicine in the course of my care at any time, without affecting my right to future care or treatment, and that the Practitioner or I may terminate the telemedicine visit at any time. I understand that I have the right to inspect all information obtained and/or recorded in the course of the telemedicine visit and may receive copies of available information for a reasonable fee.  I understand that some of the potential risks of receiving the Services via telemedicine include:  [name] Delay or interruption in medical evaluation due to technological equipment failure or disruption; . Information transmitted may not be sufficient (e.g. poor resolution of images) to allow for appropriate medical decision making by the Practitioner; and/or  . In rare instances, security protocols could fail, causing a breach of personal health information.  Furthermore, I acknowledge that it is my responsibility to provide information about my medical history, conditions and care that is complete and accurate to the best of my ability. I acknowledge that Practitioner's advice, recommendations, and/or decision may be based on factors not within their control, such as incomplete or inaccurate data provided by me or distortions of diagnostic images or specimens that may result from electronic transmissions.  I understand that the practice of medicine is not an exact science and that Practitioner makes  no warranties or guarantees regarding treatment outcomes. I acknowledge that I will receive a copy of this consent concurrently upon execution via email to the email address I last provided but may also request a printed copy by calling the office of [place].    I understand that my insurance will be billed for this visit.   I have read or had this consent read to me. . I understand the contents of this consent, which adequately explains the benefits and risks of the Services being provided via telemedicine.  . I have been provided ample opportunity to ask questions regarding this consent and the Services and have had my questions answered to my satisfaction. . I give my informed consent for the services to be provided through the use of telemedicine in my medical care  By participating in this telemedicine visit I agree to the above.

## 2018-05-16 NOTE — Patient Instructions (Signed)
Licensed Clinical Social Worker Visit Information  Materials provided: No  Mr. Wessell was given information about Chronic Care Management services today including:  1. CCM service includes personalized support from designated clinical staff supervised by his physician, including individualized plan of care and coordination with other care providers 2. 24/7 contact phone numbers for assistance for urgent and routine care needs. 3. Service will only be billed when office clinical staff spend 20 minutes or more in a month to coordinate care. 4. Only one practitioner may furnish and bill the service in a calendar month. 5. The patient may stop CCM services at any time (effective at the end of the month) by phone call to the office staff. 6. The patient will be responsible for cost sharing (co-pay) of up to 20% of the service fee (after annual deductible is met).  Patient did not agree to services and wishes to consider information provided before deciding about enrollment in CCM services.    Follow Up Plan:  Client requested that RN CM [name] call him to further discuss nursing support with CCM program services. LCSW to call client In 2 weeks to further discuss CCM program services  The patient verbalized understanding of instructions provided today and declined a print copy of patient instruction materials.   [name].Truc Winfree MSW, LCSW Licensed Clinical Social Worker [facility] Care Management [phone]

## 2018-05-16 NOTE — Chronic Care Management (AMB) (Signed)
  Care Management Note   Kenneth Fuller is a 73 y.o. year old male who is a primary care patient of [name], [name], MD. The CM team was consulted for assistance with chronic disease management and care coordination.   I reached out to Kenneth Fuller by phone today.   [name] was given information about Chronic Care Management services today including:  1. CCM service includes personalized support from designated clinical staff supervised by his physician, including individualized plan of care and coordination with other care providers 2. 24/7 contact phone numbers for assistance for urgent and routine care needs. 3. Service will only be billed when office clinical staff spend 20 minutes or more in a month to coordinate care. 4. Only one practitioner may furnish and bill the service in a calendar month. 5. The patient may stop CCM services at any time (effective at the end of the month) by phone call to the office staff. 6. The patient will be responsible for cost sharing (co-pay) of up to 20% of the service fee (after annual deductible is met). Patient did not agree to services and wishes to consider information provided before deciding about enrollment in CCM services.  Client did request that RN CM Kenneth Fuller call him to talk with him further about nursing support with CCM program.  Review of patient status, including review of consultants reports, relevant laboratory and other test results, and collaboration with appropriate care team members and the patient's provider was performed as part of comprehensive patient evaluation and provision of chronic care management services.  Of note:  Client did communicate with LCSW that client uses oxygen at night to assist with breathing. He said he had a heavy oxygen unit and was wondering about portable oxygen unit to use. 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 He asked about portable oxygen unit. He asked for Kenneth Fuller to call him to discuss CCM program.    Follow Up Plan: LCSW to call client in 2 weeks to talk further with client about CCM program services  [name].Klaryssa Fauth MSW, LCSW Licensed Clinical Social Worker [facility] [phone]

## 2018-05-17 ENCOUNTER — Telehealth: Payer: PPO | Admitting: *Deleted

## 2018-05-17 ENCOUNTER — Telehealth (INDEPENDENT_AMBULATORY_CARE_PROVIDER_SITE_OTHER): Payer: PPO | Admitting: Cardiovascular Disease

## 2018-05-17 ENCOUNTER — Encounter: Payer: Self-pay | Admitting: Cardiovascular Disease

## 2018-05-17 VITALS — Ht 67.0 in | Wt 262.0 lb

## 2018-05-17 DIAGNOSIS — N183 Chronic kidney disease, stage 3 unspecified: Secondary | ICD-10-CM

## 2018-05-17 DIAGNOSIS — E785 Hyperlipidemia, unspecified: Secondary | ICD-10-CM

## 2018-05-17 DIAGNOSIS — I5042 Chronic combined systolic (congestive) and diastolic (congestive) heart failure: Secondary | ICD-10-CM | POA: Diagnosis not present

## 2018-05-17 DIAGNOSIS — Z9989 Dependence on other enabling machines and devices: Secondary | ICD-10-CM

## 2018-05-17 DIAGNOSIS — J449 Chronic obstructive pulmonary disease, unspecified: Secondary | ICD-10-CM

## 2018-05-17 DIAGNOSIS — I10 Essential (primary) hypertension: Secondary | ICD-10-CM

## 2018-05-17 DIAGNOSIS — I110 Hypertensive heart disease with heart failure: Secondary | ICD-10-CM

## 2018-05-17 DIAGNOSIS — I2510 Atherosclerotic heart disease of native coronary artery without angina pectoris: Secondary | ICD-10-CM | POA: Diagnosis not present

## 2018-05-17 DIAGNOSIS — J9621 Acute and chronic respiratory failure with hypoxia: Secondary | ICD-10-CM | POA: Diagnosis not present

## 2018-05-17 DIAGNOSIS — G4733 Obstructive sleep apnea (adult) (pediatric): Secondary | ICD-10-CM | POA: Diagnosis not present

## 2018-05-17 DIAGNOSIS — E119 Type 2 diabetes mellitus without complications: Secondary | ICD-10-CM

## 2018-05-17 DIAGNOSIS — E1169 Type 2 diabetes mellitus with other specified complication: Secondary | ICD-10-CM

## 2018-05-17 NOTE — Progress Notes (Signed)
Virtual Visit via Video Note   This visit type was conducted due to national recommendations for restrictions regarding the COVID-19 Pandemic (e.g. social distancing) in an effort to limit this patient's exposure and mitigate transmission in our community.  Due to his co-morbid illnesses, this patient is at least at moderate risk for complications without adequate follow up.  This format is felt to be most appropriate for this patient at this time.  All issues noted in this document were discussed and addressed.  A limited physical exam was performed with this format.  Please refer to the patient's chart for his consent to telehealth for [facility].   Evaluation Performed:  Follow-up visit  Date:  05/17/2018   ID:  ALEXY HELDT, DOB Apr 23, 1945, MRN [id number]  Patient Location: Other:  campground  Provider Location: Home  PCP:  [name], [name], MD  Cardiologist:  Takeia Ciaravino Electrophysiologist:  None   Chief Complaint:  CHF follow up  History of Present Illness:    Kenneth Fuller is a 73 y.o. male who presents via audio/video conferencing for a telehealth visit today.    Donnie has chronic diastolic heart failure, morbid obesity, obstructive sleep apnea, type 2 diabetes mellitus complicated by neuropathy and CKD stage III, moderate-to-severe restrictive lung disease, pulmonary artery hypertension.  He had only minor nonobstructive CAD by cardiac catheterization.  He has a history of angioedema with ACE inhibitors and has had pancreatitis of uncertain etiology.  He's done well since his last appointment.  Continues to have NYHA functional class 2-3 exertional dyspnea.  He has not had problems with edema and has been monitoring his weight closely.  He takes furosemide 80 mg every morning and will take an extra 40 mg in the afternoon if his weight exceeds 265 pounds.  Typically he weighs around 262 pounds, but his weight has gone down as low as 259 pounds without developing  dizziness or syncope.  He denies orthopnea and PND.  He has not had chest pain.  Glycemic control is good with the most recent hemoglobin A1c of 6.5%.  Creatinine has been stable around 1.4-1.5.  Potassium level was normal when last checked in January.  He underwent cardiac catheterization in October 2018 he has preserved left ventricular systolic function and minimal coronary artery disease.  LVEDP was borderline at the time of catheterization (10-14 mmHg).  His echo did show grade 2 diastolic dysfunction.  However pulmonary function test had shown fairly severe obstructive lung disease with FEV1 in the 40-45% of predicted range.  He did not have much improvement with bronchodilators either on the PFTs or clinically.  He does have a history of roughly 5 pack years of smoking, but quit 40 years ago.  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  Chest CT did not show evidence of severe interstitial lung disease.  His most recent echocardiogram was a technically difficult study performed with [facility] in November 2019 showed borderline left ventricular systolic function with ejection fraction of 45-50%.  The PA pressure could not be calculated.  Previous studies have shown ejection fraction of 55-60% and varying degrees of diastolic dysfunction.  The patient does not have symptoms concerning for COVID-19 infection (fever, chills, cough, or new shortness of breath).    Past Medical History:  Diagnosis Date  . Allergy   . Asthma   . CAD (coronary artery disease)   . Diabetes mellitus   . Fibromyalgia   . GERD (gastroesophageal  reflux disease)   . GI bleeding   . Gout   . Hyperlipidemia   . Hypertension   . Hypogonadism male   . MRSA cellulitis   . Neuropathy   . Obesity   . Shortness of breath dyspnea    with exertion   . Sleep apnea    cpap- 14   . Wheezing    no asthma diagnosis   Past Surgical History:  Procedure Laterality Date  . BACK SURGERY    .  CARDIAC CATHETERIZATION    . COLONOSCOPY    . LEFT HEART CATH AND CORONARY ANGIOGRAPHY N/A 12/02/2016   Procedure: LEFT HEART CATH AND CORONARY ANGIOGRAPHY;  Surgeon: Jettie Booze, MD;  Location: [facility];  Service: Cardiovascular;  Laterality: N/A;  . LUMBAR LAMINECTOMY/DECOMPRESSION MICRODISCECTOMY N/A 01/02/2014   Procedure: CENTRAL DECOMPRESSION LUMBAR LAMINECTOMY L3-L4, L4-L5;  Surgeon: Tobi Bastos, MD;  Location: [facility];  Service: Orthopedics;  Laterality: N/A;  . neck fusion       Current Meds  Medication Sig  . albuterol (PROVENTIL HFA;VENTOLIN HFA) 108 (90 Base) MCG/ACT inhaler Inhale 2 puffs into the lungs every 6 (six) hours as needed for wheezing or shortness of breath.  [name] atorvastatin (LIPITOR) 40 MG tablet Take 1 tablet by mouth once daily  . bisoprolol (ZEBETA) 5 MG tablet Take 0.5 tablets (2.5 mg total) by mouth daily.  . Blood Glucose Monitoring Suppl (ACCU-CHEK AVIVA) device Use as instructed  . budesonide-formoterol (SYMBICORT) 160-4.5 MCG/ACT inhaler Inhale 2 puffs into the lungs 2 (two) times daily.  [name] buPROPion (WELLBUTRIN XL) 150 MG 24 hr tablet Take 1 tablet by mouth once daily  . Cholecalciferol (VITAMIN D PO) Take 5,000 Units by mouth every morning.   . clonazePAM (KLONOPIN) 0.5 MG tablet TAKE 1 TABLET BY MOUTH AT BEDTIME AS NEEDED  . furosemide (LASIX) 40 MG tablet Take 2 tablets (80 mg total) by mouth every morning. Take extra 40 mg tablet as directed.  [name] glimepiride (AMARYL) 4 MG tablet TAKE 1 TABLET BY MOUTH WITH BREAKFAST  . glucose blood (ACCU-CHEK AVIVA) test strip 1 each by Other route daily. Use as instructed  . hydrALAZINE (APRESOLINE) 25 MG tablet Take 1 tablet (25 mg total) by mouth 3 (three) times daily.  . isosorbide dinitrate (ISORDIL) 30 MG tablet Take 1 tablet (30 mg total) by mouth 3 (three) times daily.  . Lancets (ACCU-CHEK SOFT TOUCH) lancets 1 each by Other route daily. Use as instructed  . ondansetron (ZOFRAN ODT) 4 MG  disintegrating tablet Take 1 tablet (4 mg total) by mouth every 8 (eight) hours as needed for nausea or vomiting.  [name] DELICA LANCETS 10F MISC 1 each by Does not apply route daily. Test 1X per day and prn  . OXYGEN Inhale 2 L into the lungs at bedtime. 2lpm with sleep   . Polyethyl Glycol-Propyl Glycol (SYSTANE OP) Apply 1 drop to eye daily as needed (for dry eye relief).   . potassium chloride SA (K-DUR,KLOR-CON) 20 MEQ tablet Take 20 mEq by mouth daily.  . predniSONE (DELTASONE) 20 MG tablet Take 3 tabs daily for 1 week, then 2 tabs daily for week 2, then 1 tab daily for week 3.  . PRODIGY NO CODING BLOOD GLUC test strip CHECK BLOOD SUGARS ONCE DAILY     Allergies:   Amlodipine besy-benazepril hcl; Oxycodone; Phenergan [promethazine hcl]; and Hydrocodone   Social History   Tobacco Use  . Smoking status: Former Smoker    Last  attempt to quit: 02/08/1969    Years since quitting: 49.3  . Smokeless tobacco: Former Network engineer Use Topics  . Alcohol use: No    Alcohol/week: 0.0 standard drinks  . Drug use: No     Family Hx: The patient's family history includes Colon cancer in his mother; Deep vein thrombosis in his brother; Diabetes in his father; Drug abuse in his sister; Heart attack in his father; Heart attack (age of onset: 66) in his son; Heart disease in his brother, brother, and father; Heart failure in his sister; Kidney disease in his sister. There is no history of Colon polyps.  ROS:   Please see the history of present illness.     All other systems reviewed and are negative.   Prior CV studies:   The following studies were reviewed today:   Labs/Other Tests and Data Reviewed:    EKG:  An ECG dated 01/04/2018 was personally reviewed today and demonstrated:  Mild sinus tachycardia, rightward axis deviation  Recent Labs: 01/04/2018: B Natriuretic Peptide 330.0 01/16/2018: NT-Pro BNP 652 02/03/2018: Hemoglobin 10.9; Platelets 388 02/16/2018: ALT 23; BUN 15;  Creatinine, Ser 1.54; Potassium 4.3; Sodium 136   Recent Lipid Panel Lab Results  Component Value Date/Time   CHOL 74 (L) 02/06/2018 11:13 AM   CHOL 111 06/12/2012 12:32 PM   TRIG 113 02/06/2018 11:13 AM   TRIG 141 01/24/2014 11:43 AM   TRIG 192 (H) 06/12/2012 12:32 PM   HDL 32 (L) 02/06/2018 11:13 AM   HDL 38 (L) 01/24/2014 11:43 AM   HDL 33 (L) 06/12/2012 12:32 PM   CHOLHDL 2.3 02/06/2018 11:13 AM   CHOLHDL 4.8 11/22/2006 05:12 PM   LDLCALC 19 02/06/2018 11:13 AM   LDLCALC 27 10/25/2013 12:24 PM   LDLCALC 40 06/12/2012 12:32 PM    Wt Readings from Last 3 Encounters:  05/17/18 262 lb (118.8 kg)  04/24/18 265 lb 6.4 oz (120.4 kg)  02/22/18 268 lb (121.6 kg)     Objective:    Vital Signs:  Ht _0  (1.702 m)   Wt 262 lb (118.8 kg)   BMI 41.04 kg/m    Well nourished, well developed male in no acute distress. Obese.  No ankle edema.  Unable to evaluate jugular venous pulsations.  Breathing comfortably and speaking in uninterrupted sentences.  ASSESSMENT & PLAN:    1. Chronic combined systolic and diastolic heart failure ([place])   2. Chronic obstructive pulmonary disease, unspecified COPD type ([place])   3. OSA (obstructive sleep apnea)   4. Essential hypertension   5. Coronary artery disease involving native coronary artery of native heart without angina pectoris   6. Hyperlipidemia associated with type 2 diabetes mellitus ([place])   7. Diabetes mellitus type II, non insulin dependent ([place])   8. CKD (chronic kidney disease), stage III (H[facility])   9. Obesity, Class III, BMI 40-49.9 (morbid obesity) ([place])     1. CHF:  Assessment of volume status was a challenge even when we were able to meet in person, due to his obesity.  As far as I can tell he is close to euvolemia although we could try to push his "dry weight" lower.  His dyspnea is clearly multifactorial.  He is doing a good job of adjusting his own diuretic dose based on weight and symptoms.  EF estimated to be 45-50% most  recently, but I am not sure that was necessarily an accurate assessment.    History of ACE inhibitor induced angioedema.  He  is on hydralazine/nitrates.  Recommended that he get a blood pressure cuff.  No recent symptoms of orthostatic hypotension.   2. COPD:  He has both reactive airway disease and significant restrictive lung abnormalities (probably a combination of obesity, maybe occupational exposure in his youth). Using bisoprolol rather than less selective agent since he has reactive airway disease.  He has a large oxygen concentrator at home that is not portable and finds it difficult to be autonomous with the oxygen tanks which do not last enough.  We will try to get him a portable oxygen concentrator. 3. OSA: Compliant with therapy and tolerating it well with good results. 4. HTN: reportedly well controlled 5. CAD:  Asymptomatic.  Repeat coronary angiography in October 2018 showed only minor nonobstructive atherosclerosis, no progression from 2011 6. HLP: LDL<70, chronically low HDL related to obesity. 7. DM: Good glycemic control. 8. CKD:  Renal function at baseline creatinine around 1.4-1.5, GFR around 50.   9. Morbid obesity is clearly a big part of his problems with shortness of breath.  He is trying to lose some true weight and we will have to adjust his "dry weight" accordingly.   COVID-19 Education: The signs and symptoms of COVID-19 were discussed with the patient and how to seek care for testing (follow up with PCP or arrange E-visit).  The importance of social distancing was discussed today.  Time:   Today, I have spent 22 minutes with the patient with telehealth technology discussing the above problems.     Medication Adjustments/Labs and Tests Ordered: Current medicines are reviewed at length with the patient today.  Concerns regarding medicines are outlined above.  Tests Ordered: Orders Placed This Encounter  Procedures  . PR PORTABLE OXYGEN CONCENTRATOR   Medication  Changes: No orders of the defined types were placed in this encounter.   Disposition:  Follow up 12 months  Signed, Sanda Klein, MD  05/17/2018 12:52 PM    [place]

## 2018-05-17 NOTE — Patient Instructions (Signed)
Medication Instructions:  Your physician recommends that you continue on your current medications as directed. Please refer to the Current Medication list given to you today.  If you need a refill on your cardiac medications before your next appointment, please call your pharmacy.   Lab work: NONE  Testing/Procedures: NONE  Follow-Up: At [facility], you and your health needs are our priority.  As part of our continuing mission to provide you with exceptional heart care, we have created designated Provider Care Teams.  These Care Teams include your primary Cardiologist (physician) and Advanced Practice Providers (APPs -  Physician Assistants and Nurse Practitioners) who all work together to provide you with the care you need, when you need it. You will need a follow up appointment in 12 months.  Please call our office 2 months in advance to schedule this appointment.  You may see Dr Sallyanne Kuster or one of the following Advanced Practice Providers on your designated Care Team: Almyra Deforest, [place] . Fabian Sharp, PA-C  Any Other Special Instructions Will Be Listed Below (If Applicable). RX FOR PORTABLE OXYGEN SENT TO [facility]

## 2018-05-18 DIAGNOSIS — R0902 Hypoxemia: Secondary | ICD-10-CM | POA: Diagnosis not present

## 2018-05-18 DIAGNOSIS — R0600 Dyspnea, unspecified: Secondary | ICD-10-CM | POA: Diagnosis not present

## 2018-05-19 ENCOUNTER — Other Ambulatory Visit: Payer: Self-pay | Admitting: Family Medicine

## 2018-05-22 ENCOUNTER — Encounter: Payer: Self-pay | Admitting: Family Medicine

## 2018-05-22 ENCOUNTER — Other Ambulatory Visit: Payer: Self-pay

## 2018-05-22 ENCOUNTER — Ambulatory Visit (INDEPENDENT_AMBULATORY_CARE_PROVIDER_SITE_OTHER): Payer: PPO | Admitting: Family Medicine

## 2018-05-22 DIAGNOSIS — E785 Hyperlipidemia, unspecified: Secondary | ICD-10-CM | POA: Diagnosis not present

## 2018-05-22 DIAGNOSIS — E1169 Type 2 diabetes mellitus with other specified complication: Secondary | ICD-10-CM

## 2018-05-22 DIAGNOSIS — K219 Gastro-esophageal reflux disease without esophagitis: Secondary | ICD-10-CM

## 2018-05-22 DIAGNOSIS — F339 Major depressive disorder, recurrent, unspecified: Secondary | ICD-10-CM | POA: Diagnosis not present

## 2018-05-22 DIAGNOSIS — I10 Essential (primary) hypertension: Secondary | ICD-10-CM | POA: Diagnosis not present

## 2018-05-22 MED ORDER — BLOOD PRESSURE CUFF MISC: 1.0000 | Freq: Every day | 0 refills | Status: DC

## 2018-05-22 NOTE — Progress Notes (Signed)
Virtual Visit via telephone Note  I connected with Kenneth Fuller on 05/22/18 at 1008 by telephone and verified that I am speaking with the correct person using two identifiers. Kenneth Fuller is currently located at home and no other people are currently with her during visit. The provider, [name] Maraki Macquarrie, MD is located in their office at time of visit.  Call ended at 1021  I discussed the limitations, risks, security and privacy concerns of performing an evaluation and management service by telephone and the availability of in person appointments. I also discussed with the patient that there may be a patient responsible charge related to this service. The patient expressed understanding and agreed to proceed.   History and Present Illness: Hyperlipidemia Patient is coming in for recheck of his hyperlipidemia. The patient is currently taking atorvastatin. They deny any issues with myalgias or history of liver damage from it. They deny any focal numbness or weakness or chest pain.   Hypertension Patient is currently on isosorbide dinitrate and hydralazine and bisoprolol, and their blood pressure today is unknown because patient does not have a blood pressure cuff at home and we did a tele-visit today. Patient denies any lightheadedness or dizziness. Patient denies headaches, blurred vision, chest pains, shortness of breath, or weakness. Denies any side effects from medication and is content with current medication.   GERD Patient is currently on no medication and denies issues.  She denies any major symptoms or abdominal pain or belching or burping. She denies any blood in her stool or lightheadedness or dizziness.   Depression and anxiety Patient feels like depression and anxiety are doing well.  He is currently taking Zoloft and Wellbutrin and feels like they are both doing so well for him that he would like to try seeing if he can come off of them without needing it.  We  recommended not to stop both of them at the same time but to stop 1 for couple weeks and see how it goes and if it goes well then consider the other one.  He will consider that.  Patient denies any suicidal ideations or major depression or anxiety currently.  No diagnosis found.  Outpatient Encounter Medications as of 05/22/2018  Medication Sig  . albuterol (PROVENTIL HFA;VENTOLIN HFA) 108 (90 Base) MCG/ACT inhaler Inhale 2 puffs into the lungs every 6 (six) hours as needed for wheezing or shortness of breath.  [name] atorvastatin (LIPITOR) 40 MG tablet Take 1 tablet by mouth once daily  . bisoprolol (ZEBETA) 5 MG tablet Take 0.5 tablets (2.5 mg total) by mouth daily.  . Blood Glucose Monitoring Suppl (ACCU-CHEK AVIVA) device Use as instructed  . budesonide-formoterol (SYMBICORT) 160-4.5 MCG/ACT inhaler Inhale 2 puffs into the lungs 2 (two) times daily.  [name] buPROPion (WELLBUTRIN XL) 150 MG 24 hr tablet Take 1 tablet by mouth once daily  . Cholecalciferol (VITAMIN D PO) Take 5,000 Units by mouth every morning.   . clonazePAM (KLONOPIN) 0.5 MG tablet TAKE 1 TABLET BY MOUTH AT BEDTIME AS NEEDED  . furosemide (LASIX) 40 MG tablet Take 2 tablets (80 mg total) by mouth every morning. Take extra 40 mg tablet as directed.  [name] glimepiride (AMARYL) 4 MG tablet TAKE 1 TABLET BY MOUTH WITH BREAKFAST  . glucose blood (ACCU-CHEK AVIVA) test strip 1 each by Other route daily. Use as instructed  . hydrALAZINE (APRESOLINE) 25 MG tablet Take 1 tablet (25 mg total) by mouth 3 (three) times daily.  . isosorbide dinitrate (  ISORDIL) 30 MG tablet Take 1 tablet (30 mg total) by mouth 3 (three) times daily.  . Lancets (ACCU-CHEK SOFT TOUCH) lancets 1 each by Other route daily. Use as instructed  . ondansetron (ZOFRAN ODT) 4 MG disintegrating tablet Take 1 tablet (4 mg total) by mouth every 8 (eight) hours as needed for nausea or vomiting.  [name] DELICA LANCETS 87Z MISC 1 each by Does not apply route daily. Test 1X per day  and prn  . OXYGEN Inhale 2 L into the lungs at bedtime. 2lpm with sleep   . Polyethyl Glycol-Propyl Glycol (SYSTANE OP) Apply 1 drop to eye daily as needed (for dry eye relief).   . potassium chloride SA (K-DUR,KLOR-CON) 20 MEQ tablet Take 20 mEq by mouth daily.  . predniSONE (DELTASONE) 20 MG tablet Take 3 tabs daily for 1 week, then 2 tabs daily for week 2, then 1 tab daily for week 3.  . PRODIGY NO CODING BLOOD GLUC test strip CHECK BLOOD SUGARS ONCE DAILY  . sertraline (ZOLOFT) 50 MG tablet Take 1 tablet by mouth once daily   No facility-administered encounter medications on file as of 05/22/2018.     Review of Systems  Constitutional: Negative for chills and fever.  Eyes: Negative for visual disturbance.  Respiratory: Negative for shortness of breath and wheezing.   Cardiovascular: Negative for chest pain and leg swelling.  Musculoskeletal: Negative for back pain and gait problem.  Skin: Negative for rash.  Neurological: Negative for dizziness and weakness.  Psychiatric/Behavioral: Negative for decreased concentration, dysphoric mood and sleep disturbance. The patient is not nervous/anxious.   All other systems reviewed and are negative.   Observations/Objective: Patient sounds comfortable on the phone and in no acute distress  Assessment and Plan: Problem List Items Addressed This Visit      Cardiovascular and Mediastinum   Essential hypertension     Digestive   GERD (gastroesophageal reflux disease)     Endocrine   Hyperlipidemia associated with type 2 diabetes mellitus ([place]) - Primary     Other   Depression, recurrent ([place])       Follow Up Instructions: Patient is happy with current medications denies any major issues and we will have him come back in 3 months for recheck    I discussed the assessment and treatment plan with the patient. The patient was provided an opportunity to ask questions and all were answered. The patient agreed with the plan and  demonstrated an understanding of the instructions.   The patient was advised to call back or seek an in-person evaluation if the symptoms worsen or if the condition fails to improve as anticipated.  The above assessment and management plan was discussed with the patient. The patient verbalized understanding of and has agreed to the management plan. Patient is aware to call the clinic if symptoms persist or worsen. Patient is aware when to return to the clinic for a follow-up visit. Patient educated on when it is appropriate to go to the emergency department.    I provided 13 minutes of non-face-to-face time during this encounter.    Worthy Rancher, MD

## 2018-05-29 ENCOUNTER — Other Ambulatory Visit: Payer: Self-pay | Admitting: Family Medicine

## 2018-05-30 ENCOUNTER — Telehealth: Payer: Self-pay

## 2018-05-31 ENCOUNTER — Ambulatory Visit: Payer: Self-pay | Admitting: Licensed Clinical Social Worker

## 2018-05-31 DIAGNOSIS — F411 Generalized anxiety disorder: Secondary | ICD-10-CM

## 2018-05-31 DIAGNOSIS — I10 Essential (primary) hypertension: Secondary | ICD-10-CM

## 2018-05-31 DIAGNOSIS — K219 Gastro-esophageal reflux disease without esophagitis: Secondary | ICD-10-CM

## 2018-05-31 DIAGNOSIS — J441 Chronic obstructive pulmonary disease with (acute) exacerbation: Secondary | ICD-10-CM

## 2018-05-31 DIAGNOSIS — E1142 Type 2 diabetes mellitus with diabetic polyneuropathy: Secondary | ICD-10-CM

## 2018-05-31 DIAGNOSIS — N183 Chronic kidney disease, stage 3 unspecified: Secondary | ICD-10-CM

## 2018-05-31 NOTE — Chronic Care Management (AMB) (Signed)
  Care Management Note   Kenneth Fuller is a 73 y.o. year old male who is a primary care patient of [name], [name], MD. The CM team was consulted for assistance with chronic disease management and care coordination.   I reached out to Kenneth Fuller by phone today.   Kenneth Fuller was given information about Chronic Care Management services today including:  1. CCM service includes personalized support from designated clinical staff supervised by his physician, including individualized plan of care and coordination with other care providers 2. 24/7 contact phone numbers for assistance for urgent and routine care needs. 3. Service will only be billed when office clinical staff spend 20 minutes or more in a month to coordinate care. 4. Only one practitioner may furnish and bill the service in a calendar month. 5. The patient may stop CCM services at any time (effective at the end of the month) by phone call to the office staff. 6. The patient will be responsible for cost sharing (co-pay) of up to 20% of the service fee (after annual deductible is met). Patient did not agree to services and wishes to consider information provided before deciding about enrollment in CCM services. Client has asked for RN CM to call him to discuss CCM services. He also asked for  CCM literature to  be mailed to him for him to review.   Review of patient status, including review of consultants reports, relevant laboratory and other test results, and collaboration with appropriate care team members and the patient's provider was performed as part of comprehensive patient evaluation and provision of chronic care management services.   Follow Up Plan:  LCSW to collaborate with [facility] regarding client requests. LCSW to call client in next 2 weeks to talk with client about CCM program services.  Kenneth Fuller.Kenneth Fuller MSW, LCSW Licensed Clinical Social Worker [facility]  [phone]

## 2018-05-31 NOTE — Patient Instructions (Signed)
Licensed Clinical Social Worker Visit Information  Materials Provided: No  Kenneth Fuller was given information about Chronic Care Management services today including:  1. CCM service includes personalized support from designated clinical staff supervised by his physician, including individualized plan of care and coordination with other care providers 2. 24/7 contact phone numbers for assistance for urgent and routine care needs. 3. Service will only be billed when office clinical staff spend 20 minutes or more in a month to coordinate care. 4. Only one practitioner may furnish and bill the service in a calendar month. 5. The patient may stop CCM services at any time (effective at the end of the month) by phone call to the office staff. 6. The patient will be responsible for cost sharing (co-pay) of up to 20% of the service fee (after annual deductible is met).  Patient did not agree to services and wishes to consider information provided before deciding about enrollment in CCM services.  Kenneth Fuller requested that RNCM call him to talk further with him about nursing support with CCM program. Kenneth Fuller also asked if CCM materials  Could be mailed to him for him to review  Follow Up Plan: LCSW to collaborate with RNCM regarding client requests. LCSW to call client in next 2 weeks to talk further with client about CCM program services.  The patient verbalized understanding of instructions provided today and declined a print copy of patient instruction materials.    S. MSW, LCSW Licensed Clinical Social Worker [facility] Care [facility] [phone] 

## 2018-06-02 ENCOUNTER — Ambulatory Visit: Payer: PPO | Admitting: *Deleted

## 2018-06-02 DIAGNOSIS — E119 Type 2 diabetes mellitus without complications: Secondary | ICD-10-CM

## 2018-06-02 DIAGNOSIS — I10 Essential (primary) hypertension: Secondary | ICD-10-CM

## 2018-06-02 DIAGNOSIS — G4700 Insomnia, unspecified: Secondary | ICD-10-CM | POA: Insufficient documentation

## 2018-06-02 NOTE — Chronic Care Management (AMB) (Signed)
Chronic Care Management   Telephone Consult Note  06/02/2018 Name: Kenneth Fuller MRN: [id number] DOB: 12/08/1945  Referred by: [name], Joshua A, MD Reason for referral : Chronic Care Management (RN initial consult)  Kenneth Fuller is a 73 y.o. year old male who sees [name], Joshua A, MD for primary care. The CM team was consulted for assistance with care coordination and chronic disease management. I spoke with Kenneth Fuller by telephone today. He has been talking with Kenneth "Scott" Forrest, LCSW with the [facility] CCM Team regarding his psychosocial needs and wanted to consult with me to discuss nursing needs prior to consenting to CCM services. Our conversation today primarily focused on the role of CCM in assisting with self management of chronic medical conditions. Kenneth Fuller did want to address his difficulty sleeping during today's telephone call.    Kenneth. Kenneth Fuller was given information about Chronic Care Management services today including:  1. CCM service includes personalized support from designated clinical staff supervised by his physician, including individualized plan of care and coordination with other care providers 2. 24/7 contact phone numbers for assistance for urgent and routine care needs. 3. Service will only be billed when office clinical staff spend 20 minutes or more in a month to coordinate care. 4. Only one practitioner may furnish and bill the service in a calendar month. 5. The patient may stop CCM services at any time (effective at the end of the month) by phone call to the office staff. 6. The patient will be responsible for cost sharing (co-pay) of up to 20% of the service fee (after annual deductible is met). HTA covers CCM services at 100% with no cost to the patient.   Patient agreed to services and verbal consent obtained.     Goals Addressed      Patient Stated   . "I would like to sleep better at night" (pt-stated)       Current Barriers:   . Knowledge Deficits related to sleep hygiene.  Nurse Case Manager Clinical Goal(s):  . Over the next 30 days, patient will verbalize understanding of plan for insomnia. . Over the next 30 days, patient will work with RN Case Manager, PCP, and LCSW to address needs related to insomnia. . Over the next 30 days, patient will verbalize basic understanding of insomnia disease process and self health management plan as evidenced by improved quality of sleep.  Interventions:  . Evaluation of current treatment plan related to insomnia and patient's adherence to plan as established by provider. . Provided education to patient re: sleep hygiene . Reviewed medications with patient and discussed clonazepam usage and perceived effectiveness. o He is falling asleep better with the clonazepam but he still wakes up during the night and early morning and is unable to go back to sleep . Discussed plans with patient for ongoing care management follow up and provided patient with direct contact information for care management team  . Questioned previous treatments o Has used Ambien in the past with good results . Planned collaboration with PCP regarding medications  Patient Self Care Activities:  . Self administers medications as prescribed . Attends all scheduled provider appointments . Calls pharmacy for medication refills . Attends church or other social activities . Performs ADL's independently . Performs IADL's independently . Calls provider office for new concerns or questions        Follow Up Plan:  RNCM will reach out to patient via telephone over the next 7 days      , RN-BC, BSN Nurse Case Manager [facility] [phone]   

## 2018-06-02 NOTE — Patient Instructions (Signed)
Visit Information  Goals Addressed      Patient Stated   . "I would like to sleep better at night" (pt-stated)       Current Barriers:  [name] Knowledge Deficits related to sleep hygiene.  Nurse Case Manager Clinical Goal(s):  [name] Over the next 30 days, patient will verbalize understanding of plan for insomnia. . Over the next 30 days, patient will work with Chief Strategy Officer, PCP, and LCSW to address needs related to insomnia. . Over the next 30 days, patient will verbalize basic understanding of insomnia disease process and self health management plan as evidenced by improved quality of sleep.  Interventions:  . Evaluation of current treatment plan related to insomnia and patient's adherence to plan as established by provider. . Provided education to patient re: sleep hygiene . Reviewed medications with patient and discussed clonazepam usage and perceived effectiveness. o He is falling asleep better with the clonazepam but he still wakes up during the night and early morning and is unable to go back to sleep . Discussed plans with patient for ongoing care management follow up and provided patient with direct contact information for care management team  . Questioned previous treatments o Has used Ambien in the past with good results . Planned collaboration with PCP regarding medications  Patient Self Care Activities:  . Self administers medications as prescribed . Attends all scheduled provider appointments . Calls pharmacy for medication refills . Attends church or other social activities . Performs ADL's independently . Performs IADL's independently . Calls provider office for new concerns or questions  Initial goal documentation         The patient verbalized understanding of instructions provided today and declined a print copy of patient instruction materials.   The CM team will reach out to the patient again over the next 7 days.   [name], RN-BC, BSN Nurse Case  Manager [facility] [phone]

## 2018-06-06 ENCOUNTER — Telehealth: Payer: Self-pay | Admitting: Family Medicine

## 2018-06-06 ENCOUNTER — Telehealth: Payer: PPO | Admitting: *Deleted

## 2018-06-06 NOTE — Telephone Encounter (Signed)
Error == "so that they will NOT refill again.

## 2018-06-06 NOTE — Telephone Encounter (Signed)
Pt aware not to take metformin - of DR [name] told him not to.  Removed from list - so that they will fill again.

## 2018-06-14 ENCOUNTER — Ambulatory Visit (INDEPENDENT_AMBULATORY_CARE_PROVIDER_SITE_OTHER): Payer: PPO | Admitting: Licensed Clinical Social Worker

## 2018-06-14 DIAGNOSIS — N183 Chronic kidney disease, stage 3 unspecified: Secondary | ICD-10-CM

## 2018-06-14 DIAGNOSIS — J441 Chronic obstructive pulmonary disease with (acute) exacerbation: Secondary | ICD-10-CM

## 2018-06-14 DIAGNOSIS — K219 Gastro-esophageal reflux disease without esophagitis: Secondary | ICD-10-CM

## 2018-06-14 DIAGNOSIS — I10 Essential (primary) hypertension: Secondary | ICD-10-CM

## 2018-06-14 DIAGNOSIS — G4700 Insomnia, unspecified: Secondary | ICD-10-CM

## 2018-06-14 DIAGNOSIS — F411 Generalized anxiety disorder: Secondary | ICD-10-CM

## 2018-06-14 DIAGNOSIS — E119 Type 2 diabetes mellitus without complications: Secondary | ICD-10-CM | POA: Diagnosis not present
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
## 2018-06-14 NOTE — Patient Instructions (Signed)
Licensed Clinical Social Worker Visit Information  Goals we discussed today:  Goals Addressed               .       . Client has stress related to ongoing health issues and wishes to talk more about stress over current health issues (pt-stated)       Current Barriers:  [name] Mental Health Concerns    .  Challenges related to managing health issues of client   Clinical Social Work Clinical Goal(s):  [name] Over the next 30 days, client will work with SW to address concerns related to health issues of client and client management of health issues faced  Interventions: . Provided patient with information about CCM program services  . Encouraged client to communicate with RNCM to discuss nursing needs of client .   Patient Self Care Activities:  . Self administers medications as prescribed . Attends all scheduled provider appointments . Performs ADL's independently   Plan:  LCSW to call client in next 3 weeks to talk with client about stress issues related to his management of health issues faced Client to communicate with RNCM to discuss nursing needs of client Client to attend scheduled client medical appointments   Initial goal documentation      Materials Provided: No  Follow Up Plan: LCSW to call client in next 3 weeks to talk with client about stress issues related to his  management of health issues faced.  The patient verbalized understanding of instructions provided today and declined a print copy of patient instruction materials.   Kenneth Fuller.Kenneth Fuller MSW, LCSW Licensed Clinical Social Worker [facility] [phone]

## 2018-06-17 DIAGNOSIS — R0600 Dyspnea, unspecified: Secondary | ICD-10-CM | POA: Diagnosis not present

## 2018-06-17 DIAGNOSIS — R0902 Hypoxemia: Secondary | ICD-10-CM | POA: Diagnosis not present

## 2018-06-19 ENCOUNTER — Other Ambulatory Visit: Payer: Self-pay | Admitting: Family Medicine

## 2018-06-19 DIAGNOSIS — F411 Generalized anxiety disorder: Secondary | ICD-10-CM

## 2018-06-19 DIAGNOSIS — F339 Major depressive disorder, recurrent, unspecified: Secondary | ICD-10-CM

## 2018-07-05 ENCOUNTER — Ambulatory Visit: Payer: Self-pay | Admitting: Licensed Clinical Social Worker

## 2018-07-05 DIAGNOSIS — N183 Chronic kidney disease, stage 3 unspecified: Secondary | ICD-10-CM

## 2018-07-05 DIAGNOSIS — G4700 Insomnia, unspecified: Secondary | ICD-10-CM

## 2018-07-05 DIAGNOSIS — F411 Generalized anxiety disorder: Secondary | ICD-10-CM

## 2018-07-05 DIAGNOSIS — J441 Chronic obstructive pulmonary disease with (acute) exacerbation: Secondary | ICD-10-CM

## 2018-07-05 DIAGNOSIS — E119 Type 2 diabetes mellitus without complications: Secondary | ICD-10-CM

## 2018-07-05 DIAGNOSIS — I10 Essential (primary) hypertension: Secondary | ICD-10-CM

## 2018-07-05 DIAGNOSIS — K219 Gastro-esophageal reflux disease without esophagitis: Secondary | ICD-10-CM

## 2018-07-05 NOTE — Chronic Care Management (AMB) (Signed)
Care Management Note   Kenneth Fuller is a 73 y.o. year old male who is a primary care patient of [name], [name], MD. The CM team was consulted for assistance with chronic disease management and care coordination.   I reached out to Kenneth Fuller by phone today.   Review of patient status, including review of consultants reports, relevant laboratory and other test results, and collaboration with appropriate care team members and the patient's provider was performed as part of comprehensive patient evaluation and provision of chronic care management services.   Social Determinants of Health Risk of tobacco exposure    Office Visit from 12/14/2017 in [place]  PHQ-9 Total Score  12     Goals Addressed            This Visit's Progress   . Client has stress related to ongoing health issues and wishes to talk more about stress over current health issues (pt-stated)       Current Barriers:  [name] Mental Health Concerns    .  Challenges related to managing health issues of client   Clinical Social Work Clinical Goal(s):  [name] Over the next 30 days, client will work with LCSW to address concerns related to health issues of client and client management of health issues faced  Interventions: . Provided patient with information about CCM program services  . Encouraged client to communicate with RNCM to discuss nursing needs of client . Talked with client about stress issues related to health issues faced by client . Talked with client about sleep issues faced by client . Talked with client about breathing issues for client .   Patient Self Care Activities:  . Self administers medications as prescribed . Attends all scheduled provider appointments . Performs ADL's independently   Plan:  LCSW to call client in next 3 weeks to talk with client about stress issues related to his management of health issues faced Client to communicate with RNCM to  discuss nursing needs of client Client to attend scheduled client medical appointments   Initial goal documentation        Client spoke of being independent with ADLs and IADLs. He said he has prescribed medications and is taking medications as prescribed. He did speak of insomnia and said he sleeps about 4-5 hours per night. Client said he has restless nights. Client has C Pap equipment and said it is working well . Client said that C Pap machine dries him out. Nasal passage is dry, throat is dry, tongue is dry."Patient said that sometimes his tongue is so dry that it has been stuck to roof of his mouth and he had to use water to loosen tongue." He did say when he uses CPap he does sleep better.  CSW encouraged client to speak with RNCM to discuss nursing needs of client. Client said he has breathing issues.  He said he has oxygen concentrator he uses at night but it is so heavy; he asked about options related to portable oxygen cannister use.  Client said he has back up oxygen cannisters at home.  Client said he spoke with cardiologist recently about portable oxygen. Kenneth Fuller said that cardiologist planned to send in prescription for a portable oxygen cannister system . Kenneth Fuller said he spoke with cardiologist in past 2 weeks regarding portable oxgyen system Kenneth Fuller said that  breathing issues are a main concern at present. Client has no transport needs.  Client has good appetite.  LCSW talked  with client about CCM program support services.  Follow Up Plan: LCSW to call client in next 3 weeks to talk with client about stress issues related to his management of health issues faced.  [name].Lemma Tetro MSW, LCSW Licensed Clinical Social Worker [facility] Care [facility] [phone]

## 2018-07-05 NOTE — Patient Instructions (Addendum)
Licensed Clinical Social Worker Visit Information  Goals we discussed today:  Goals Addressed            This Visit's Progress   . Client has stress related to ongoing health issues and wishes to talk more about stress over current health issues (pt-stated)       Current Barriers:  [name] Mental Health Concerns    .  Challenges related to managing health issues of client   Clinical Social Work Clinical Goal(s):  [name] Over the next 30 days, client will work with LCSW to address concerns related to health issues of client and client management of health issues faced  Interventions: . Provided patient with information about CCM program services  . Encouraged client to communicate with RNCM to discuss nursing needs of client . Talked with client about sleep issues for client . Talked with client about breathing issues for client . Talked with client about stress issues related to health management for client  Patient Self Care Activities:  . Self administers medications as prescribed . Attends all scheduled provider appointments . Performs ADL's independently   Plan:  LCSW to call client in next 3 weeks to talk with client about stress issues related to his management of health issues faced Client to communicate with RNCM to discuss nursing needs of client Client to attend scheduled client medical appointments   Initial goal documentation      Materials Provided: No  Follow Up Plan:LCSW to call client in next 3 weeks to talk with client about stress issues related to his management of health issues faced.  The patient verbalized understanding of instructions provided today and declined a print copy of patient instruction materials.   [name].Mikiya Nebergall MSW, LCSW Licensed Clinical Social Worker [facility] [phone]

## 2018-07-06 ENCOUNTER — Telehealth: Payer: Self-pay | Admitting: *Deleted

## 2018-07-06 DIAGNOSIS — J449 Chronic obstructive pulmonary disease, unspecified: Secondary | ICD-10-CM

## 2018-07-06 DIAGNOSIS — I5042 Chronic combined systolic (congestive) and diastolic (congestive) heart failure: Secondary | ICD-10-CM

## 2018-07-06 NOTE — Telephone Encounter (Signed)
The patient has been called and notified that a prescription for a portable oxygen condenser will be sent to [facility] once it has the physician's signature.

## 2018-07-10 ENCOUNTER — Ambulatory Visit: Payer: PPO | Admitting: *Deleted

## 2018-07-10 ENCOUNTER — Other Ambulatory Visit: Payer: Self-pay | Admitting: *Deleted

## 2018-07-10 DIAGNOSIS — J449 Chronic obstructive pulmonary disease, unspecified: Secondary | ICD-10-CM

## 2018-07-10 DIAGNOSIS — G4700 Insomnia, unspecified: Secondary | ICD-10-CM

## 2018-07-10 MED ORDER — ZOLPIDEM TARTRATE ER 12.5 MG PO TBCR: 12.5000 mg | EXTENDED_RELEASE_TABLET | Freq: Every evening | ORAL | 0 refills | Status: DC | PRN

## 2018-07-10 NOTE — Progress Notes (Signed)
See CCM note. We talked about a month ago about switching from Clonazepam to Ambien for sleep because he had good results with Ambien. He stated that his insurance wouldn't cover a full 30 day supply back then though. He is still having trouble with staying asleep and would like to see if his insurance will cover Ambien now.   Ambien CR 6.25 or 12.81m is on the formulary but the immediate release form is not covered. I've set the Rx up if you would like to prescribe it. He will d/c clonazepam if he is able to purchase Ambien.   KChong Sicilian RN-BC, BSN Nurse Care Manager [facility] ([phone]

## 2018-07-10 NOTE — Chronic Care Management (AMB) (Signed)
Chronic Care Management   Follow Up Note   07/10/2018 Name: Kenneth Fuller MRN: [id number] DOB: [birth year]  Referred by: [name], [name], MD Reason for referral : Chronic Care Management (RN follow up)   Kenneth Fuller is a 73 y.o. year old male who is a primary care patient of [name], [name], MD. The CCM team was consulted for assistance with chronic disease management and care coordination needs.    Review of patient status, including review of consultants reports, relevant laboratory and other test results, and collaboration with appropriate care team members and the patient's provider was performed as part of comprehensive patient evaluation and provision of chronic care management services.    Goals Addressed            This Visit's Progress     Patient Stated   . "I would like to sleep better at night" (pt-stated)       Current Barriers:  [name] Knowledge Deficits related to sleep hygiene.  Nurse Case Manager Clinical Goal(s):  [name] Over the next 30 days, patient will verbalize understanding of plan for managing insomnia. . Over the next 30 days, patient will work with Consulting civil engineer and PCP to address needs related to insomnia.  . Over the next 30 days, patient will verbalize basic understanding of insomina disease process and self health management plan as evidenced by reporting better quality of sleep.  Interventions:  . Evaluation of current treatment plan related to insomnia and patient's adherence to plan as established by provider. . Evaluated prior treatments.  o Ambien worked well.  Kenneth Fuller with PCP regarding sleep medications.  o Authorized trial of Ambien  o D/C clonazepam  . Will setup script and send to provider for authorization o Quantity limit when used previously o ambien CR is on formulary, other forms are not . Discussed desired effects and side effects of Ambien . Advised to report any unexpected side effects to our office by calling  [phone]  Patient Self Care Activities:  . Performs ADL's independently . Performs IADL's independently      . I would like to be able to leave my house and not have to carry around an oxygen tank" (pt-stated)       Current Barriers:  . None  Nurse Case Manager Clinical Goal(s):  [name] Over the next 7 days, patient will work with Consulting civil engineer to address needs related to obtaining a portable oxygen concentrator.   Interventions:  . Evaluation of current treatment plan related to oxygen therapy and patient's adherence to plan as established by provider. . Advised patient to contact me by calling [phone] if [facility] is unable to fill the Rx for a portable oxygen concentrator. . Discussed plans with patient for ongoing care management follow up and provided patient with direct contact information for care management team  . Recent cardiology and PCP notes reviewed . Cardiology has written a script for portable oxygen and they plan to send it to [facility].  . Verified with Kenneth Fuller that he does have an oxygen concentrator in his home and portable oxygen tank. He is leasing these from [employer].  . Advised that he will likely need additional office notes and documented O2 saturations for medicare to cover the cost of a portable oxygen concentrator.   Patient Self Care Activities:  . Performs ADL's independently . Performs IADL's independently  Initial goal documentation         Follow up Plan  The care management team will reach out to the patient again over the next 14 days.    Kenneth Sicilian, RN-BC, BSN Nurse Care Manager [facility] [phone]

## 2018-07-10 NOTE — Progress Notes (Signed)
Please let him know that I did send in the medication and not use both of them together, I sent him a 30-day supply and if it does work does have him recheck with me in 4 weeks and we can always discuss further treatment at that point. Caryl Pina, MD [facility] 07/10/2018, 3:15 PM

## 2018-07-10 NOTE — Patient Instructions (Signed)
Visit Information  Goals Addressed            This Visit's Progress     Patient Stated   . "I would like to sleep better at night" (pt-stated)       Current Barriers:  [name] Knowledge Deficits related to sleep hygiene.  Nurse Case Manager Clinical Goal(s):  [name] Over the next 30 days, patient will verbalize understanding of plan for managing insomnia. . Over the next 30 days, patient will work with Consulting civil engineer and PCP to address needs related to insomnia.  . Over the next 30 days, patient will verbalize basic understanding of insomina disease process and self health management plan as evidenced by reporting better quality of sleep.  Interventions:  . Evaluation of current treatment plan related to insomnia and patient's adherence to plan as established by provider. . Evaluated prior treatments.  o Ambien worked well.  Geanie Cooley with PCP regarding sleep medications.  o Authorized trial of Ambien  o D/C clonazepam  . Will setup script and send to provider for authorization o Quantity limit when used previously o Ambien CR is on formulary but other is not . Discussed desired effects and side effects of Ambien . Advised to report any unexpected side effects to our office by calling [phone]  Patient Self Care Activities:  . Performs ADL's independently . Performs IADL's independently  Please see past updates related to this goal by clicking on the "Past Updates" button in the selected goal       . I would like to be able to leave my house and not have to carry around an oxygen tank" (pt-stated)       Current Barriers:  . None  Nurse Case Manager Clinical Goal(s):  [name] Over the next 7 days, patient will work with Consulting civil engineer to address needs related to obtaining a portable oxygen concentrator.   Interventions:  . Evaluation of current treatment plan related to oxygen therapy and patient's adherence to plan as established by provider. . Advised patient to contact me by  calling [phone] if [facility] is unable to fill the Rx for a portable oxygen concentrator. . Discussed plans with patient for ongoing care management follow up and provided patient with direct contact information for care management team  . Recent cardiology and PCP notes reviewed . Cardiology has written a script for portable oxygen and they plan to send it to [facility].  . Verified with Mr Skeens that he does have an oxygen concentrator in his home and portable oxygen tank. He is leasing these from [employer].  . Advised that he will likely need additional office notes and documented O2 saturations for medicare to cover the cost of a portable oxygen concentrator.   Patient Self Care Activities:  . Performs ADL's independently . Performs IADL's independently  Initial goal documentation         The patient verbalized understanding of instructions provided today and declined a print copy of patient instruction materials.   The care management team will reach out to the patient again over the next 14 days.    [name], RN-BC, BSN Nurse Care Manager [facility] [phone]

## 2018-07-17 ENCOUNTER — Ambulatory Visit (INDEPENDENT_AMBULATORY_CARE_PROVIDER_SITE_OTHER): Payer: PPO | Admitting: *Deleted

## 2018-07-17 ENCOUNTER — Other Ambulatory Visit: Payer: Self-pay

## 2018-07-17 DIAGNOSIS — Z0000 Encounter for general adult medical examination without abnormal findings: Secondary | ICD-10-CM

## 2018-07-17 NOTE — Patient Instructions (Signed)
Preventive Care 2 Years and Older, Male Preventive care refers to lifestyle choices and visits with your health care provider that can promote health and wellness. What does preventive care include?   A yearly physical exam. This is also called an annual well check.  Dental exams once or twice a year.  Routine eye exams. Ask your health care provider how often you should have your eyes checked.  Personal lifestyle choices, including: ? Daily care of your teeth and gums. ? Regular physical activity. ? Eating a healthy diet. ? Avoiding tobacco and drug use. ? Limiting alcohol use. ? Practicing safe sex. ? Taking low doses of aspirin every day. ? Taking vitamin and mineral supplements as recommended by your health care provider. What happens during an annual well check? The services and screenings done by your health care provider during your annual well check will depend on your age, overall health, lifestyle risk factors, and family history of disease. Counseling Your health care provider may ask you questions about your:  Alcohol use.  Tobacco use.  Drug use.  Emotional well-being.  Home and relationship well-being.  Sexual activity.  Eating habits.  History of falls.  Memory and ability to understand (cognition).  Work and work Statistician. Screening You may have the following tests or measurements:  Height, weight, and BMI.  Blood pressure.  Lipid and cholesterol levels. These may be checked every 5 years, or more frequently if you are over 9 years old.  Skin check.  Lung cancer screening. You may have this screening every year starting at age 57 if you have a 30-pack-year history of smoking and currently smoke or have quit within the past 15 years.  Colorectal cancer screening. All adults should have this screening starting at age 90 and continuing until age 69. You will have tests every 1-10 years, depending on your results and the type of screening  test. People at increased risk should start screening at an earlier age. Screening tests may include: ? Guaiac-based fecal occult blood testing. ? Fecal immunochemical test (FIT). ? Stool DNA test. ? Virtual colonoscopy. ? Sigmoidoscopy. During this test, a flexible tube with a tiny camera (sigmoidoscope) is used to examine your rectum and lower colon. The sigmoidoscope is inserted through your anus into your rectum and lower colon. ? Colonoscopy. During this test, a long, thin, flexible tube with a tiny camera (colonoscope) is used to examine your entire colon and rectum.  Prostate cancer screening. Recommendations will vary depending on your family history and other risks.  Hepatitis C blood test.  Hepatitis B blood test.  Sexually transmitted disease (STD) testing.  Diabetes screening. This is done by checking your blood sugar (glucose) after you have not eaten for a while (fasting). You may have this done every 1-3 years.  Abdominal aortic aneurysm (AAA) screening. You may need this if you are a current or former smoker.  Osteoporosis. You may be screened starting at age 30 if you are at high risk. Talk with your health care provider about your test results, treatment options, and if necessary, the need for more tests. Vaccines Your health care provider may recommend certain vaccines, such as:  Influenza vaccine. This is recommended every year.  Tetanus, diphtheria, and acellular pertussis (Tdap, Td) vaccine. You may need a Td booster every 10 years.  Varicella vaccine. You may need this if you have not been vaccinated.  Zoster vaccine. You may need this after age 42.  Measles, mumps, and rubella (MMR) vaccine.  You may need at least one dose of MMR if you were born in 1957 or later. You may also need a second dose.  Pneumococcal 13-valent conjugate (PCV13) vaccine. One dose is recommended after age 65.  Pneumococcal polysaccharide (PPSV23) vaccine. One dose is recommended  after age 65.  Meningococcal vaccine. You may need this if you have certain conditions.  Hepatitis A vaccine. You may need this if you have certain conditions or if you travel or work in places where you may be exposed to hepatitis A.  Hepatitis B vaccine. You may need this if you have certain conditions or if you travel or work in places where you may be exposed to hepatitis B.  Haemophilus influenzae type b (Hib) vaccine. You may need this if you have certain risk factors. Talk to your health care provider about which screenings and vaccines you need and how often you need them. This information is not intended to replace advice given to you by your health care provider. Make sure you discuss any questions you have with your health care provider. Document Released: 02/21/2015 Document Revised: 03/17/2017 Document Reviewed: 11/26/2014 Elsevier Interactive Patient Education  2019 Elsevier Inc.  

## 2018-07-17 NOTE — Progress Notes (Addendum)
MEDICARE ANNUAL WELLNESS VISIT  07/17/2018  Telephone Visit Disclaimer This Medicare AWV was conducted by telephone due to national recommendations for restrictions regarding the COVID-19 Pandemic (e.g. social distancing).  I verified, using two identifiers, that I am speaking with Kenneth Fuller or their authorized healthcare agent. I discussed the limitations, risks, security, and privacy concerns of performing an evaluation and management service by telephone and the potential availability of an in-person appointment in the future. The patient expressed understanding and agreed to proceed.   Subjective:  Kenneth Fuller is a 73 y.o. male patient of [name], [name], MD who had a Medicare Annual Wellness Visit today via telephone. Kenneth Fuller is Retired and lives with their spouse. he has 3 children. he reports that he is socially active and does interact with friends/family regularly. he is not physically active and enjoys worshiping, ministering at church and reading religious articles.  Patient Care Team: [name], [name], MD as PCP - General (Family Medicine) Melina Schools, MD as Consulting Physician (Orthopedic Surgery) Harlen Labs, MD as Referring Physician (Optometry) Shea Evans, [name], LCSW as Social Worker (Licensed Clinical Social Worker) Ilean China, RN as Case Manager  Advanced Directives 07/17/2018 01/18/2018 01/04/2018 01/04/2018 07/26/2017 07/04/2017 12/02/2016  Does Patient Have a Medical Advance Directive? _0  No No  Would patient like information on creating a medical advance directive? No - Patient declined No - Patient declined No - Patient declined - No - Patient declined - No - Patient declined    Hospital Utilization Over the Past 12 Months: # of hospitalizations or ER visits: 1 # of surgeries: 0  Review of Systems    Patient reports that his overall health is unchanged compared to last year.  Patient Reported Readings (BP, Pulse,  CBG, Weight, etc) none  Review of Systems: lower back pain which is r/t his lumbar surgeries, no new pain  All other systems negative.  Pain Assessment Pain : 0-10 Pain Score: 5  Pain Type: Chronic pain Pain Location: Back Pain Orientation: (lower) Pain Descriptors / Indicators: Aching Pain Onset: More than a month ago Pain Frequency: Occasional Pain Relieving Factors: Sitting down, resting Effect of Pain on Daily Activities: can still do activities but has to take frequent breaks  Pain Relieving Factors: Sitting down, resting  Current Medications & Allergies (verified) Allergies as of 07/17/2018      Reactions   Amlodipine Besy-benazepril Hcl Swelling, Other (See Comments)   Makes tongue swell (lotrel)   Oxycodone Itching   Phenergan [promethazine Hcl] Other (See Comments)   "I can't remember."   Hydrocodone Itching   Can tolerate in low doses      Medication List       Accurate as of July 17, 2018  4:00 PM. If you have any questions, ask your nurse or doctor.        STOP taking these medications   ondansetron 4 MG disintegrating tablet Commonly known as:  Zofran ODT   SYSTANE OP     TAKE these medications   Accu-Chek Aviva device Use as instructed   albuterol 108 (90 Base) MCG/ACT inhaler Commonly known as:  VENTOLIN HFA Inhale 2 puffs into the lungs every 6 (six) hours as needed for wheezing or shortness of breath.   atorvastatin 40 MG tablet Commonly known as:  LIPITOR Take 1 tablet by mouth once daily   bisoprolol 5 MG tablet Commonly known as:  ZEBETA Take 0.5 tablets (2.5 mg  total) by mouth daily.   Blood Pressure Cuff Misc 1 each by Does not apply route daily.   budesonide-formoterol 160-4.5 MCG/ACT inhaler Commonly known as:  Symbicort Inhale 2 puffs into the lungs 2 (two) times daily.   buPROPion 150 MG 24 hr tablet Commonly known as:  WELLBUTRIN XL Take 1 tablet by mouth once daily   clonazePAM 0.5 MG tablet Commonly known as:   KLONOPIN TAKE 1 TABLET BY MOUTH AT BEDTIME AS NEEDED   furosemide 40 MG tablet Commonly known as:  LASIX Take 2 tablets (80 mg total) by mouth every morning. Take extra 40 mg tablet as directed.   glimepiride 4 MG tablet Commonly known as:  AMARYL TAKE 1 TABLET BY MOUTH WITH BREAKFAST   hydrALAZINE 25 MG tablet Commonly known as:  APRESOLINE Take 1 tablet (25 mg total) by mouth 3 (three) times daily.   isosorbide dinitrate 30 MG tablet Commonly known as:  ISORDIL Take 1 tablet (30 mg total) by mouth 3 (three) times daily.   lisinopril 20 MG tablet Commonly known as:  ZESTRIL Take 20 mg by mouth daily.   OneTouch Delica Lancets 71U Misc 1 each by Does not apply route daily. Test 1X per day and prn   accu-chek soft touch lancets 1 each by Other route daily. Use as instructed   OXYGEN Inhale 2 L into the lungs at bedtime. 2lpm with sleep   potassium chloride SA 20 MEQ tablet Commonly known as:  K-DUR Take 20 mEq by mouth daily.   Prodigy No Coding Blood Gluc test strip Generic drug:  glucose blood CHECK BLOOD SUGARS ONCE DAILY   glucose blood test strip Commonly known as:  Accu-Chek Aviva 1 each by Other route daily. Use as instructed   sertraline 50 MG tablet Commonly known as:  ZOLOFT Take 1 tablet by mouth once daily   VITAMIN D PO Take 5,000 Units by mouth every morning.   zolpidem 12.5 MG CR tablet Commonly known as:  AMBIEN CR Take 1 tablet (12.5 mg total) by mouth at bedtime as needed for sleep.       History (reviewed): Past Medical History:  Diagnosis Date  . Allergy   . Asthma   . CAD (coronary artery disease)   . Diabetes mellitus   . Fibromyalgia   . GERD (gastroesophageal reflux disease)   . GI bleeding   . Gout   . Hyperlipidemia   . Hypertension   . Hypogonadism male   . Insomnia   . MRSA cellulitis   . Neuropathy   . Obesity   . Shortness of breath dyspnea    with exertion   . Sleep apnea    cpap- 14   . Wheezing    no  asthma diagnosis   Past Surgical History:  Procedure Laterality Date  . BACK SURGERY    . CARDIAC CATHETERIZATION    . COLONOSCOPY    . LEFT HEART CATH AND CORONARY ANGIOGRAPHY N/A 12/02/2016   Procedure: LEFT HEART CATH AND CORONARY ANGIOGRAPHY;  Surgeon: Jettie Booze, MD;  Location: [facility];  Service: Cardiovascular;  Laterality: N/A;  . LUMBAR LAMINECTOMY/DECOMPRESSION MICRODISCECTOMY N/A 01/02/2014   Procedure: CENTRAL DECOMPRESSION LUMBAR LAMINECTOMY L3-L4, L4-L5;  Surgeon: Tobi Bastos, MD;  Location: [facility];  Service: Orthopedics;  Laterality: N/A;  . neck fusion     Family History  Problem Relation Age of Onset  . Colon cancer Mother   . Diabetes Father  siblings  . Heart disease Father        brother  . Heart attack Father   . Kidney disease Sister   . Heart failure Sister   . Heart disease Brother   . Heart attack Son 91  . Drug abuse Sister   . Heart disease Brother   . Deep vein thrombosis Brother   . Colon polyps Neg Hx    Social History   Socioeconomic History  . Marital status: Married    Spouse name: Butch Penny  . Number of children: 3  . Years of education: 8  . Highest education level: GED or equivalent  Occupational History  . Occupation: retired/disability 1999    Employer: DISABLED    Comment: Textiles  Social Needs  . Financial resource strain: Not hard at all  . Food insecurity:    Worry: Never true    Inability: Never true  . Transportation needs:    Medical: No    Non-medical: No  Tobacco Use  . Smoking status: Former Smoker    Last attempt to quit: 02/08/1969    Years since quitting: 49.4  . Smokeless tobacco: Former Systems developer    Quit date: 1971  Substance and Sexual Activity  . Alcohol use: No    Alcohol/week: 0.0 standard drinks  . Drug use: No  . Sexual activity: Not Currently  Lifestyle  . Physical activity:    Days per week: 0 days    Minutes per session: 0 min  . Stress: Only a little  Relationships  .  Social connections:    Talks on phone: More than three times a week    Gets together: More than three times a week    Attends religious service: More than 4 times per year    Active member of club or organization: Yes    Attends meetings of clubs or organizations: More than 4 times per year    Relationship status: Married  Other Topics Concern  . Not on file  Social History Narrative   Drinks caffeine tea occasionally     Activities of Daily Living In your present state of health, do you have any difficulty performing the following activities: 07/17/2018 01/04/2018  Hearing? N N  Vision? N N  Difficulty concentrating or making decisions? N N  Walking or climbing stairs? Y N  Comment due to SOB -  Dressing or bathing? N N  Doing errands, shopping? N N  Preparing Food and eating ? N -  Using the Toilet? N -  In the past six months, have you accidently leaked urine? Y -  Comment pt states when he has to go he has to get there quick or he will leak -  Do you have problems with loss of bowel control? N -  Managing your Medications? N -  Managing your Finances? N -  Housekeeping or managing your Housekeeping? N -  Some recent data might be hidden    Patient Literacy How often do you need to have someone help you when you read instructions, pamphlets, or other written materials from your doctor or pharmacy?: 1 - Never What is the last grade level you completed in school?: GED  Exercise Current Exercise Habits: The patient does not participate in regular exercise at present, Exercise limited by: respiratory conditions(s);orthopedic condition(s)  Diet Patient reports consuming 2 meals a day and 1 snack(s) a day Patient reports that his primary diet is: Regular Patient reports that she does have regular access to  food.   Depression Screen PHQ 2/9 Scores 07/17/2018 04/24/2018 02/16/2018 01/12/2018 12/14/2017 11/11/2017 11/08/2017  PHQ - 2 Score 0 1 0 _0 PHQ- 9 Score - - - - _1 Fall Risk Fall Risk  07/17/2018 02/16/2018 01/12/2018 11/08/2017 08/25/2017  Falls in the past year? 0 0 0 No No  Number falls in past yr: - - - - -  Injury with Fall? - - - - -  Comment - - - - -  Risk for fall due to : - - - - Medication side effect  Follow up - - - - -     Objective:  Kenneth Fuller seemed alert and oriented and he participated appropriately during our telephone visit.  Blood Pressure Weight BMI  BP Readings from Last 3 Encounters:  04/24/18 135/69  02/22/18 115/67  02/16/18 118/70   Wt Readings from Last 3 Encounters:  05/17/18 262 lb (118.8 kg)  04/24/18 265 lb 6.4 oz (120.4 kg)  02/22/18 268 lb (121.6 kg)   BMI Readings from Last 1 Encounters:  05/17/18 41.04 kg/m    *Unable to obtain current vital signs, weight, and BMI due to telephone visit type  Hearing/Vision  . Kenneth Fuller did not seem to have difficulty with hearing/understanding during the telephone conversation . Reports that he has not had a formal eye exam by an eye care professional within the past year . Reports that he has not had a formal hearing evaluation within the past year *Unable to fully assess hearing and vision during telephone visit type  Cognitive Function: 6CIT Screen 07/17/2018  What Year? 0 points  What month? 0 points  What time? 0 points  Count back from 20 0 points  Months in reverse 2 points  Repeat phrase 0 points  Total Score 2    Normal Cognitive Function Screening: Yes (Normal:0-7, Significant for Dysfunction: >8)  Immunization & Health Maintenance Record Immunization History  Administered Date(s) Administered  . Influenza Whole 10/09/2008  . Influenza, High Dose Seasonal PF 11/29/2016, 11/08/2017  . Influenza,inj,Quad PF,6+ Mos 12/04/2013, 12/04/2014, 12/23/2015  . Pneumococcal Conjugate-13 12/04/2013  . Pneumococcal Polysaccharide-23 08/26/2011  . Td 11/08/2017    Health Maintenance  Topic Date Due  . FOOT EXAM  06/16/2018  . HEMOGLOBIN A1C   08/17/2018  . INFLUENZA VACCINE  09/09/2018  . OPHTHALMOLOGY EXAM  03/24/2019  . COLONOSCOPY  07/30/2020  . TETANUS/TDAP  11/09/2027  . Hepatitis C Screening  Completed  . PNA vac Low Risk Adult  Addressed       Assessment  This is a routine wellness examination for Kenneth Fuller.  Health Maintenance: Due or Overdue Health Maintenance Due  Topic Date Due  . FOOT EXAM  06/16/2018    Kenneth Fuller does not need a referral for Community Assistance: Care Management:   no Social Work:    no Prescription Assistance:  no Nutrition/Diabetes Education:  no   Plan:  Personalized Goals Goals Addressed            This Visit's Progress   . Patient Stated (pt-stated)       " I would like to keep on keeping on"      Personalized Health Maintenance & Screening Recommendations  Shingles vaccine  Lung Cancer Screening Recommended: no (Low Dose CT Chest recommended if Age 73-80 years, 30 pack-year currently smoking OR have quit w/in past 15 years) Hepatitis C Screening recommended: no HIV Screening recommended:  no  Advanced Directives: Written information was not prepared per patient's request.  Referrals & Orders No orders of the defined types were placed in this encounter.   Follow-up Plan . Follow-up with [name], [name], MD as planned . Consider Shingles vaccine at your next visit with your PCP    I have personally reviewed and noted the following in the patient's chart:   . Medical and social history . Use of alcohol, tobacco or illicit drugs  . Current medications and supplements . Functional ability and status . Nutritional status . Physical activity . Advanced directives . List of other physicians . Hospitalizations, surgeries, and ER visits in previous 12 months . Vitals . Screenings to include cognitive, depression, and falls . Referrals and appointments  In addition, I have reviewed and discussed with Kenneth Fuller certain preventive  protocols, quality metrics, and best practice recommendations. A written personalized care plan for preventive services as well as general preventive health recommendations is available and can be mailed to the patient at his request.      Marylin Crosby  07/17/2018   I have reviewed and agree with the above AWV documentation.   Evelina Dun, FNP

## 2018-07-18 DIAGNOSIS — R0902 Hypoxemia: Secondary | ICD-10-CM | POA: Diagnosis not present

## 2018-07-18 DIAGNOSIS — R0600 Dyspnea, unspecified: Secondary | ICD-10-CM | POA: Diagnosis not present

## 2018-07-21 ENCOUNTER — Telehealth: Payer: Self-pay | Admitting: Family Medicine

## 2018-07-21 NOTE — Telephone Encounter (Signed)
lmtcb

## 2018-07-21 NOTE — Telephone Encounter (Signed)
As far as I know these are the current testing guidelines Criteria for [facility] COVID-19 Testing .Patients in long-term care facilities with symptoms .Patients of any age with symptoms  .Patients with underlying conditions with symptoms  .First responders with symptoms  .Asymptomatic patients from intrinsically risky/high density environments .Asymptomatic COVID-19 convalescent plasma donors   As far as him for these currently I do not think that he would be a candidate for testing but if he develops any symptoms at all have him schedule an E-visit with that team or a tele-visit with [place] and can do the testing, just monitor closely for now and if anything develops including cough sinus congestion fevers chills body aches shortness of breath sinus pressure or ear pressure or changes in taste or smell then have him call us. Caryl Pina, MD [facility] 07/21/2018, 2:15 PM

## 2018-07-21 NOTE — Telephone Encounter (Signed)
Patient aware and verbalized understanding.

## 2018-07-21 NOTE — Telephone Encounter (Signed)
Pt exposed at church on Sunday to Covid + person Pt wants to be tested Pt is not symptomatic Please advise

## 2018-07-25 ENCOUNTER — Ambulatory Visit: Payer: Self-pay | Admitting: Licensed Clinical Social Worker

## 2018-07-25 DIAGNOSIS — E1142 Type 2 diabetes mellitus with diabetic polyneuropathy: Secondary | ICD-10-CM

## 2018-07-25 DIAGNOSIS — J441 Chronic obstructive pulmonary disease with (acute) exacerbation: Secondary | ICD-10-CM

## 2018-07-25 DIAGNOSIS — N183 Chronic kidney disease, stage 3 unspecified: Secondary | ICD-10-CM

## 2018-07-25 DIAGNOSIS — E119 Type 2 diabetes mellitus without complications: Secondary | ICD-10-CM

## 2018-07-25 DIAGNOSIS — G4700 Insomnia, unspecified: Secondary | ICD-10-CM

## 2018-07-25 DIAGNOSIS — F411 Generalized anxiety disorder: Secondary | ICD-10-CM

## 2018-07-25 DIAGNOSIS — K219 Gastro-esophageal reflux disease without esophagitis: Secondary | ICD-10-CM

## 2018-07-25 DIAGNOSIS — I10 Essential (primary) hypertension: Secondary | ICD-10-CM

## 2018-07-25 NOTE — Chronic Care Management (AMB) (Signed)
Chronic Care Management    Clinical Social Work CCM Outreach Note  07/25/2018 Name: Kenneth Fuller MRN: [id number] DOB: Jul 05, 1945  [name] is a 73 y.o. year old male who is a primary care patient of [name], [name], MD . The CCM team was consulted for assistance with assessment of psychosocial needs.   LCSW reached out via phone to client , Kenneth Fuller, today.  Review of patient status, including review of consultants reports, relevant laboratory and other test results, and collaboration with appropriate care team members and the patient's provider was performed as part of comprehensive patient evaluation and provision of chronic care management services.   Social Determinants of Health Risk of tobacco exposure      PHQ-9 Total Score  12              Goals Addressed                           . Client has stress related to ongoing health issues and wishes to talk more about stress over current health issues (pt-stated)        Current Barriers:   Mental Health Concerns      Challenges related to managing health issues of client   Clinical Social Work Clinical Goal(s):   Over the next 30 days, client will work with LCSW to address concerns related to health issues of client and client management of health issues faced  Interventions:  Encouraged client to communicate with RNCM to discuss nursing needs of client  Talked with client about stress issues related to health issues faced by client  Talked with client about sleep issues faced by client  Provided counseling support for client   Patient Self Care Activities:   Self administers medications as prescribed  Attends all scheduled provider appointments  Performs ADL's independently   Plan:  LCSW to call client in next 3 weeks to talk with client about stress issues related to his management of health issues faced Client to communicate with RNCM to discuss nursing  needs of client Client to attend scheduled client medical appointments   Initial goal documentation    Client spoke of being independent with ADLs and IADLs. He said he has prescribed medications and is taking medications as prescribed. He did speak of insomnia and said he sleeps about 4-5 hours per night. Client said he has restless nights. Client has C Pap equipment and said it is working well . Client said that C Pap machine dries him out. Nasal passage is dry, throat is dry, tongue is dry."Patient said that sometimes his tongue is so dry that it has been stuck to roof of his mouth and he had to use water to loosen tongue." He did say when he uses CPap he does sleep better.  CSW encouraged client to speak with RNCM to discuss nursing needs of client. Client said he has breathing issues.  He said he has oxygen concentrator he uses at night but it is so heavy; he asked about options related to portable oxygen cannister use.  Client said he has back up oxygen cannisters at home.  Client said he spoke with cardiologist recently about portable oxygen. Abayomi said that cardiologist planned to send in prescription for a portable oxygen cannister system . Edric said he spoke with cardiologist in past 2 weeks regarding portable oxgyen system Symeon said that  breathing issues are a main concern at present. Client  has no transport needs.  Client has good appetite.  LCSW talked with client about CCM program support services.  Client said he has his prescribed medications and is taking medications as prescribed.  Client said he thought that he may not be approved for portable oxygen system.  He said he had talked with representative at [facility] in [place], [place] about portable oxygen system. Client said he is fatigued occasionally.  He said he did not have a fever, did not have nausea, did not have a headache. LCSW encouraged client to communicate with RNCM regarding nursing needs of client  Follow  Up Plan: LCSW to call client in next 3 weeks to talk with client about stress issues related to his management of health issues faced.  [name].Noor Witte MSW, LCSW Licensed Clinical Social Worker [facility] [phone]

## 2018-07-25 NOTE — Patient Instructions (Addendum)
Licensed Clinical Water engineer Provided: No   LCSW reached out via phone to client , Kenneth Fuller, today.   Review of patient status, including review of consultants reports, relevant laboratory and other test results, and collaboration with appropriate care team members and the patient's provider was performed as part of comprehensive patient evaluation and provision of chronic care management services.    Social Determinants of Health  Risk of tobacco exposure        PHQ-9 Total Score 12                                      Goals Addressed                           . Client has stress related to ongoing health issues and wishes to talk more about stress over current health issues (pt-stated)         Current Barriers:  Mental Health Concerns  Challenges related to managing health issues of client  Clinical Social Work Clinical Goal(s):  Over the next 30 days, client will work with LCSW to address concerns related to health issues of client and client management of health issues faced Interventions:  Encouraged client to communicate with RNCM to discuss nursing needs of client  Talked with client about stress issues related to health issues faced by client  Talked with client about sleep issues faced by client    Provided counseling support for client   Patient Self Care Activities:  Self administers medications as prescribed  Attends all scheduled provider appointments   Performs ADL's independently    Plan:   LCSW to call client in next 3 weeks to talk with client about stress issues related to his management of health issues faced  Client to communicate with RNCM to discuss nursing needs of client  Client to attend scheduled client medical appointments   Initial goal documentation     Client spoke of being independent with ADLs and IADLs. He said he has prescribed medications and is taking medications as prescribed. He did speak of  insomnia and said he sleeps about 4-5 hours per night. Client said he has restless nights. Client has C Pap equipment and said it is working well . Client said that C Pap machine dries him out. Nasal passage is dry, throat is dry, tongue is dry."Patient said that sometimes his tongue is so dry that it has been stuck to roof of his mouth and he had to use water to loosen tongue." He did say when he uses CPap he does sleep better. CSW encouraged client to speak with RNCM to discuss nursing needs of client. Client said he has breathing issues. He said he has oxygen concentrator he uses at night but it is so heavy; he asked about options related to portable oxygen cannister use. Client said he has back up oxygen cannisters at home. Client said he spoke with cardiologist recently about portable oxygen. Kenneth Fuller said that cardiologist planned to send in prescription for a portable oxygen cannister system . Kenneth Fuller said he spoke with cardiologist in past 2 weeks regarding portable oxgyen system Kenneth Fuller said that breathing issues are a main concern at present. Client has no transport needs. Client has good appetite. LCSW talked with client about CCM program support services.    Client said he has  his prescribed medications and is taking medications as prescribed. Client said he thought that he may not be approved for portable oxygen system. He said he had talked with representative at [facility] in [place], [place] about portable oxygen system. Client said he is fatigued occasionally. He said he did not have a fever, did not have nausea, did not have a headache.  LCSW encouraged client to communicate with RNCM regarding nursing needs of client   Follow Up Plan: LCSW to call client in next 3 weeks to talk with client about stress issues related to his management of health issues faced.  The patient verbalized understanding of instructions provided today and declined a print copy of patient instruction materials.     [name].Jackee Glasner MSW, LCSW  Licensed Clinical Social Worker  [facility]  [phone]

## 2018-07-26 ENCOUNTER — Ambulatory Visit (INDEPENDENT_AMBULATORY_CARE_PROVIDER_SITE_OTHER): Payer: PPO

## 2018-07-26 DIAGNOSIS — J449 Chronic obstructive pulmonary disease, unspecified: Secondary | ICD-10-CM | POA: Diagnosis not present

## 2018-07-26 DIAGNOSIS — I5042 Chronic combined systolic (congestive) and diastolic (congestive) heart failure: Secondary | ICD-10-CM | POA: Diagnosis not present

## 2018-07-26 DIAGNOSIS — G4700 Insomnia, unspecified: Secondary | ICD-10-CM

## 2018-07-26 MED ORDER — PULSE OXIMETER MISC: 1.0000 | 0 refills | Status: DC | PRN

## 2018-07-26 NOTE — Patient Instructions (Signed)
Visit Information  Goals Addressed            This Visit's Progress     Patient Stated   . "I would like to sleep better at night" (pt-stated)       Current Barriers:  [name] Knowledge Deficits related to sleep hygiene.  Nurse Case Manager Clinical Goal(s):  [name] Over the next 30 days, patient will verbalize understanding of plan for managing insomnia. . Over the next 30 days, patient will work with Consulting civil engineer and PCP to address needs related to insomnia.  . Over the next 30 days, patient will verbalize basic understanding of insomina disease process and self health management plan as evidenced by reporting better quality of sleep.  Interventions:  . Evaluation of current treatment plan  o Taking zolpidem 72m at bedtime o Sleep has improved since starting zolpidem  o Only complaint is higher copay of $35 . Advised to report any unexpected side effects to our office by calling [phone] Patient Self Care Activities:  . Performs ADL's independently . Performs IADL's independently  Please see past updates related to this goal by clicking on the "Past Updates" button in the selected goal       . I would like to be able to leave my house and not have to carry around an oxygen tank" (pt-stated)       Current Barriers:  . None  Nurse Case Manager Clinical Goal(s):  .Marland KitchenOver the next 14 days, patient will work with RConsulting civil engineerto address needs related to obtaining a portable oxygen concentrator.   Interventions:  . Reviewed and discussed current oxygen recommendations and usage . Insurance did not cover script for portable oxygen from cardiologist . Scheduled appointment for patient next with RNCM to obtain the O2 sats required for Medicare coverage of portable oxygen . Script for pulse oximeter printed for patient to pickup at his visit next week  o Pharmacy does not have then in stock due to backorder but said that they would be able to hold one for Mr RDelmundoif he had  a prescription from his doctor  Patient Self Care Activities:  . Performs ADL's independently . Performs IADL's independently  Please see past updates related to this goal by clicking on the "Past Updates" button in the selected goal         Meds ordered this encounter  Medications  . Misc. Devices (PULSE OXIMETER) MISC    Sig: 1 each by Does not apply route continuous as needed.    Dispense:  1 each    Refill:  0    The patient verbalized understanding of instructions provided today and declined a print copy of patient instruction materials.   Face to Face appointment with care management team member scheduled for: 08/03/18 with RNCM   KChong Sicilian RN-BC, BSN Nurse Care Manager [place]([phone]

## 2018-07-26 NOTE — Chronic Care Management (AMB) (Signed)
  Chronic Care Management   Follow Up Note   07/26/2018 Name: Kenneth Fuller MRN: [id number] DOB: [birth year]  Referred by: [name], [name], MD Reason for referral : Chronic Care Management (Follow Up)   TOBBY FAWCETT is a 73 y.o. year old male who is a primary care patient of [name], [name], MD. The CCM team was consulted for assistance with chronic disease management and care coordination needs.    Review of patient status, including review of consultants reports, relevant laboratory and other test results, and collaboration with appropriate care team members and the patient's provider was performed as part of comprehensive patient evaluation and provision of chronic care management services.    I spoke with Kenneth Fuller by telephone today.   Goals Addressed      Patient Stated   . "I would like to sleep better at night" (pt-stated)       Current Barriers:  [name] Knowledge Deficits related to sleep hygiene.  Nurse Case Manager Clinical Goal(s):  [name] Over the next 30 days, patient will verbalize understanding of plan for managing insomnia. . Over the next 30 days, patient will work with Consulting civil engineer and PCP to address needs related to insomnia.  . Over the next 30 days, patient will verbalize basic understanding of insomina disease process and self health management plan as evidenced by reporting better quality of sleep.  Interventions:  . Evaluation of current treatment plan  o Taking zolpidem 33m at bedtime o Sleep has improved since starting zolpidem  o Only complaint is higher copay of $35 . Advised to report any unexpected side effects to our office by calling [phone] Patient Self Care Activities:  . Performs ADL's independently . Performs IADL's independently     . I would like to be able to leave my house and not have to carry around an oxygen tank" (pt-stated)       Current Barriers:  . None  Nurse Case Manager Clinical Goal(s):  .Marland KitchenOver the  next 14 days, patient will work with RConsulting civil engineerto address needs related to obtaining a portable oxygen concentrator.   Interventions:  . Reviewed and discussed current oxygen recommendations and usage . Insurance did not cover script for portable oxygen from cardiologist . Scheduled appointment for patient next with RNCM to obtain the O2 sats required for Medicare coverage of portable oxygen . Script for pulse oximeter printed for patient to pickup at his visit next week  o Pharmacy does not have then in stock due to backorder but said that they would be able to hold one for Kenneth RKnobelif he had a prescription from his doctor  Patient Self Care Activities:  . Performs ADL's independently . Performs IADL's independently        Meds ordered this encounter  Medications  . Misc. Devices (PULSE OXIMETER) MISC    Sig: 1 each by Does not apply route continuous as needed.    Dispense:  1 each    Refill:  0     Follow Up Plan Face to Face appointment with care management team member scheduled for: 08/03/2018 with RNCM   KChong Sicilian RN-BC, BSN Nurse Care Manager [place]([phone]

## 2018-08-03 ENCOUNTER — Other Ambulatory Visit: Payer: Self-pay

## 2018-08-03 ENCOUNTER — Ambulatory Visit: Payer: PPO | Admitting: *Deleted

## 2018-08-03 ENCOUNTER — Other Ambulatory Visit: Payer: Self-pay | Admitting: Family Medicine

## 2018-08-03 ENCOUNTER — Other Ambulatory Visit: Payer: Self-pay | Admitting: Physician Assistant

## 2018-08-03 VITALS — HR 95

## 2018-08-03 DIAGNOSIS — F339 Major depressive disorder, recurrent, unspecified: Secondary | ICD-10-CM

## 2018-08-03 DIAGNOSIS — F411 Generalized anxiety disorder: Secondary | ICD-10-CM

## 2018-08-03 DIAGNOSIS — I5032 Chronic diastolic (congestive) heart failure: Secondary | ICD-10-CM

## 2018-08-03 DIAGNOSIS — J449 Chronic obstructive pulmonary disease, unspecified: Secondary | ICD-10-CM

## 2018-08-03 NOTE — Patient Instructions (Signed)
Visit Information  Goals Addressed            This Visit's Progress     Patient Stated   . "I would like get a portable oxygen concentrator so that I can be more active and perform IADLs better" (pt-stated)       Current Barriers:  [name] Knowledge deficit related to portable oxygen procurement   Nurse Case Manager Clinical Goal(s):  Over the next 14 days, patient will work with Consulting civil engineer and [employer] to address needs related to obtaining a portable oxygen concentrator Over the next 14 days, patient will obtain a portable pulse oximeter  Interventions:  . Script given for pulse oximeter for patient to take to pharmacy of his choice . Oxygen saturation measured sitting on room air, ambulating on room air, and ambulating on 3L of O2. . Reviewed results with patient: o Current Encounter SPO2 o  08/03/18 o 1144 97% ambulating on 3L of O2 via [place] o  08/03/18 o 1143 (!) 89% ambulating on room air o  08/03/18 o 1142 93% at rest on room air .  Will send order and supporting notes for portable oxygen concentrator to [place] [facility]  Patient Self Care Activities:  . Performs ADL's independently . Performs IADL's independently-has difficulty due to shortness of breath  Please see past updates related to this goal by clicking on the "Past Updates" button in the selected goal          The patient verbalized understanding of instructions provided today and declined a print copy of patient instruction materials.   The care management team will reach out to the patient again over the next 7 days.   [name], RN-BC, BSN Nurse Care Manager [facility] [phone]

## 2018-08-03 NOTE — Chronic Care Management (AMB) (Signed)
  Chronic Care Management   Follow Up Note   08/03/2018 Name: Kenneth Fuller MRN: [id number] DOB: 1945-10-31  Referred by: [name], [name], MD Reason for referral : No chief complaint on file.   Kenneth Fuller is a 73 y.o. year old male who is a primary care patient of [name], [name], MD. The CCM team was consulted for assistance with chronic disease management and care coordination needs.    Review of patient status, including review of consultants reports, relevant laboratory and other test results, and collaboration with appropriate care team members and the patient's provider was performed as part of comprehensive patient evaluation and provision of chronic care management services.    Subective I saw [name] in the office today specifically to evaluate his oxygen needs. He currently uses 2.5L of O2 via nasal canula at home. His ability to care for himself is limited because of his oxygen needs and he does not have a portable oxygen concentrator.  Goals Addressed      Patient Stated   . "I would like get a portable oxygen concentrator so that I can be more active and perform IADLs better" (pt-stated)       Current Barriers:  [name] Knowledge deficit related to portable oxygen procurement   Nurse Case Manager Clinical Goal(s):   Over the next 14 days, patient will work with Consulting civil engineer and [employer] to address needs related to obtaining a portable oxygen concentrator   Over the next 14 days, patient will obtain a portable pulse oximeter  Interventions:  . Script given for pulse oximeter for patient to take to pharmacy of his choice . Oxygen saturation measured on room air at rest, ambulating on room air, and ambulating on 3L of O2. . Reviewed results with patient: Current Encounter SPO2  08/03/18 1144 97% ambulating on 3L of O2 via [place]  08/03/18 1143 (!) 89% ambulating on room air  08/03/18 1142 93% at rest on room air .  Recommendation: A  portable oxygen concentrator would provide him with more freedom of movement. He will be able to increase his activity level and will be able to more directly participate in ADLs and IADLs. . Will send order and supporting notes for portable oxygen concentrator to [place]  Patient Self Care Activities:  . Performs ADL's independently . Performs IADL's independently-has difficulty due to shortness of breath  Please see past updates related to this goal by clicking on the "Past Updates" button in the selected goal          Follow-Up Plan The care management team will reach out to the patient again over the next 7 days.    [name], RN-BC, BSN Nurse Care Manager [facility] [phone]

## 2018-08-08 NOTE — Telephone Encounter (Signed)
Please verify if patient is actually taking the potassium or not. If not taking, no need to refill

## 2018-08-08 NOTE — Telephone Encounter (Signed)
Called patient and he verified that he is taking Potassium 1 tablet (20 Meq) daily. Verified pharmacy and he asked for a 90 supply

## 2018-08-09 NOTE — Telephone Encounter (Signed)
Saline with refill

## 2018-08-10 ENCOUNTER — Ambulatory Visit: Payer: Self-pay | Admitting: *Deleted

## 2018-08-10 DIAGNOSIS — J449 Chronic obstructive pulmonary disease, unspecified: Secondary | ICD-10-CM

## 2018-08-10 DIAGNOSIS — I5032 Chronic diastolic (congestive) heart failure: Secondary | ICD-10-CM

## 2018-08-10 NOTE — Progress Notes (Addendum)
  Chronic Care Management   Follow Up Note  08/10/2018 Name: Kenneth Fuller MRN: [id number] DOB: Sep 21, 1945  Received a prior authorization approval from [employer] for a portable oxygen concentrator from [employer]. The approval letter was sent to Mr Minor as well as Psychiatrist.   Follow up plan: The care management team will reach out to the patient again over the next 30 days.   [name], RN-BC, BSN Nurse Care Manager [facility] [phone]    I have reviewed this encounter including the documentation in this note and/or discussed this patient with the provider. I am certifying that I agree with the content of this note as supervising physician.  Caryl Pina, MD [facility] 08/13/2018, 8:16 PM

## 2018-08-11 ENCOUNTER — Encounter: Payer: Self-pay | Admitting: Nurse Practitioner

## 2018-08-11 ENCOUNTER — Telehealth: Payer: PPO

## 2018-08-11 ENCOUNTER — Other Ambulatory Visit: Payer: Self-pay

## 2018-08-11 ENCOUNTER — Ambulatory Visit (INDEPENDENT_AMBULATORY_CARE_PROVIDER_SITE_OTHER): Payer: PPO | Admitting: Nurse Practitioner

## 2018-08-11 ENCOUNTER — Telehealth: Payer: Self-pay | Admitting: Family Medicine

## 2018-08-11 VITALS — BP 111/58 | HR 83 | Temp 99.0°F | Ht 67.0 in | Wt 277.0 lb

## 2018-08-11 DIAGNOSIS — R0600 Dyspnea, unspecified: Secondary | ICD-10-CM | POA: Diagnosis not present

## 2018-08-11 DIAGNOSIS — R0902 Hypoxemia: Secondary | ICD-10-CM | POA: Diagnosis not present

## 2018-08-11 DIAGNOSIS — N3000 Acute cystitis without hematuria: Secondary | ICD-10-CM

## 2018-08-11 DIAGNOSIS — R300 Dysuria: Secondary | ICD-10-CM

## 2018-08-11 DIAGNOSIS — I5032 Chronic diastolic (congestive) heart failure: Secondary | ICD-10-CM | POA: Diagnosis not present

## 2018-08-11 LAB — MICROSCOPIC EXAMINATION
RBC, Urine: NONE SEEN /hpf (ref 0–2)
Renal Epithel, UA: NONE SEEN /hpf

## 2018-08-11 LAB — URINALYSIS, COMPLETE
Bilirubin, UA: NEGATIVE
Glucose, UA: NEGATIVE
Ketones, UA: NEGATIVE
Nitrite, UA: NEGATIVE
Protein,UA: NEGATIVE
RBC, UA: NEGATIVE
Specific Gravity, UA: 1.015 (ref 1.005–1.030)
Urobilinogen, Ur: 0.2 mg/dL (ref 0.2–1.0)
pH, UA: 5.5 (ref 5.0–7.5)

## 2018-08-11 MED ORDER — SULFAMETHOXAZOLE-TRIMETHOPRIM 800-160 MG PO TABS: 1.0000 | ORAL_TABLET | Freq: Two times a day (BID) | ORAL | 0 refills | Status: DC

## 2018-08-11 NOTE — Progress Notes (Signed)
   Subjective:    Patient ID: Kenneth Fuller, male    DOB: Nov 27, 1945, 73 y.o.   MRN: [id number]   Chief Complaint: Urinary Retention   HPI Patient come sin c/o urinary retention. The last several days he feels like he is going to pee on hisself then he gets to the bathroom and he just pees a little. Mild dysuria. Some trouble with stream. Started about 2 days ago.   Review of Systems  Constitutional: Negative for activity change and appetite change.  HENT: Negative.   Eyes: Negative for pain.  Respiratory: Negative for shortness of breath.   Cardiovascular: Negative for chest pain, palpitations and leg swelling.  Gastrointestinal: Negative for abdominal pain.  Endocrine: Negative for polydipsia.  Genitourinary: Positive for difficulty urinating, dysuria, frequency and urgency.  Skin: Negative for rash.  Neurological: Negative for dizziness, weakness and headaches.  Hematological: Does not bruise/bleed easily.  Psychiatric/Behavioral: Negative.   All other systems reviewed and are negative.      Objective:   Physical Exam Vitals signs and nursing note reviewed.  Constitutional:      Appearance: Normal appearance.  Cardiovascular:     Rate and Rhythm: Normal rate and regular rhythm.     Heart sounds: Normal heart sounds.  Pulmonary:     Effort: Pulmonary effort is normal.     Breath sounds: Normal breath sounds.  Abdominal:     General: Abdomen is flat. Bowel sounds are normal.     Palpations: Abdomen is soft.     Tenderness: There is no right CVA tenderness or left CVA tenderness.  Neurological:     General: No focal deficit present.     Mental Status: He is alert and oriented to person, place, and time.    BP (!) 111/58   Pulse 83   Temp 99 F (37.2 C) (Oral)   Ht _0  (1.702 m)   Wt 277 lb (125.6 kg)   BMI 43.38 kg/m        Assessment & Plan:  Kenneth Fuller in today with chief complaint of Urinary Retention   1. Dysuria - Urinalysis,  Complete  2. Acute cystitis without hematuria Take medication as prescribe Cotton underwear Take shower not bath Cranberry juice, yogurt Force fluids AZO over the counter X2 days Culture pending RTO prn  Meds ordered this encounter  Medications  . sulfamethoxazole-trimethoprim (BACTRIM DS) 800-160 MG tablet    Sig: Take 1 tablet by mouth 2 (two) times daily.    Dispense:  20 tablet    Refill:  0    Order Specific Question:   Supervising Provider    Answer:   Worthy Rancher [[id number]]   [place], FNP

## 2018-08-11 NOTE — Patient Instructions (Signed)
Urinary Tract Infection, Adult A urinary tract infection (UTI) is an infection of any part of the urinary tract. The urinary tract includes:  The kidneys.  The ureters.  The bladder.  The urethra. These organs make, store, and get rid of pee (urine) in the body. What are the causes? This is caused by germs (bacteria) in your genital area. These germs grow and cause swelling (inflammation) of your urinary tract. What increases the risk? You are more likely to develop this condition if:  You have a small, thin tube (catheter) to drain pee.  You cannot control when you pee or poop (incontinence).  You are male, and: ? You use these methods to prevent pregnancy: ? A medicine that kills sperm (spermicide). ? A device that blocks sperm (diaphragm). ? You have low levels of a male hormone (estrogen). ? You are pregnant.  You have genes that add to your risk.  You are sexually active.  You take antibiotic medicines.  You have trouble peeing because of: ? A prostate that is bigger than normal, if you are male. ? A blockage in the part of your body that drains pee from the bladder (urethra). ? A kidney stone. ? A nerve condition that affects your bladder (neurogenic bladder). ? Not getting enough to drink. ? Not peeing often enough.  You have other conditions, such as: ? Diabetes. ? A weak disease-fighting system (immune system). ? Sickle cell disease. ? Gout. ? Injury of the spine. What are the signs or symptoms? Symptoms of this condition include:  Needing to pee right away (urgently).  Peeing often.  Peeing small amounts often.  Pain or burning when peeing.  Blood in the pee.  Pee that smells bad or not like normal.  Trouble peeing.  Pee that is cloudy.  Fluid coming from the vagina, if you are male.  Pain in the belly or lower back. Other symptoms include:  Throwing up (vomiting).  No urge to eat.  Feeling mixed up (confused).  Being tired  and grouchy (irritable).  A fever.  Watery poop (diarrhea). How is this treated? This condition may be treated with:  Antibiotic medicine.  Other medicines.  Drinking enough water. Follow these instructions at home:  Medicines  Take over-the-counter and prescription medicines only as told by your doctor.  If you were prescribed an antibiotic medicine, take it as told by your doctor. Do not stop taking it even if you start to feel better. General instructions  Make sure you: ? Pee until your bladder is empty. ? Do not hold pee for a long time. ? Empty your bladder after sex. ? Wipe from front to back after pooping if you are a male. Use each tissue one time when you wipe.  Drink enough fluid to keep your pee pale yellow.  Keep all follow-up visits as told by your doctor. This is important. Contact a doctor if:  You do not get better after 1-2 days.  Your symptoms go away and then come back. Get help right away if:  You have very bad back pain.  You have very bad pain in your lower belly.  You have a fever.  You are sick to your stomach (nauseous).  You are throwing up. Summary  A urinary tract infection (UTI) is an infection of any part of the urinary tract.  This condition is caused by germs in your genital area.  There are many risk factors for a UTI. These include having a small, thin  tube to drain pee and not being able to control when you pee or poop.  Treatment includes antibiotic medicines for germs.  Drink enough fluid to keep your pee pale yellow. This information is not intended to replace advice given to you by your health care provider. Make sure you discuss any questions you have with your health care provider. Document Released: 07/14/2007 Document Revised: 01/12/2018 Document Reviewed: 08/04/2017 Elsevier Patient Education  2020 [employer].

## 2018-08-11 NOTE — Telephone Encounter (Signed)
Appt made with MMM

## 2018-08-14 ENCOUNTER — Ambulatory Visit (INDEPENDENT_AMBULATORY_CARE_PROVIDER_SITE_OTHER): Payer: PPO | Admitting: Licensed Clinical Social Worker

## 2018-08-14 DIAGNOSIS — E1142 Type 2 diabetes mellitus with diabetic polyneuropathy: Secondary | ICD-10-CM | POA: Diagnosis not present

## 2018-08-14 DIAGNOSIS — I10 Essential (primary) hypertension: Secondary | ICD-10-CM | POA: Diagnosis not present

## 2018-08-14 DIAGNOSIS — J441 Chronic obstructive pulmonary disease with (acute) exacerbation: Secondary | ICD-10-CM

## 2018-08-14 DIAGNOSIS — N183 Chronic kidney disease, stage 3 unspecified: Secondary | ICD-10-CM

## 2018-08-14 DIAGNOSIS — G4700 Insomnia, unspecified: Secondary | ICD-10-CM

## 2018-08-14 DIAGNOSIS — K219 Gastro-esophageal reflux disease without esophagitis: Secondary | ICD-10-CM

## 2018-08-14 DIAGNOSIS — F411 Generalized anxiety disorder: Secondary | ICD-10-CM

## 2018-08-14 NOTE — Patient Instructions (Signed)
Licensed Clinical Social Worker Visit Information  Goals we discussed today:  Goals Addressed            This Visit's Progress   . Client has stress related to ongoing health issues and wishes to talk more about stress over current health issues (pt-stated)       Current Barriers:  Kenneth Fuller Mental Health Concerns    .  Challenges related to managing health issues of client   Clinical Social Work Clinical Goal(s):  Kenneth Fuller Over the next 30 days, client will work with LCSW to address concerns related to health issues of client and client management of health issues faced  Interventions: . Provided patient with information about CCM program services   . Encouraged client to communicate with RNCM to discuss nursing needs of client . Talked with client about his management of health issues faced by client  Patient Self Care Activities:  . Self administers medications as prescribed . Attends all scheduled provider appointments . Performs ADL's independently   Plan:  LCSW to call client in next 3 weeks to talk with client about stress issues related to his management of health issues faced Client to communicate with RNCM to discuss nursing needs of client Client to attend scheduled client medical appointments   Initial goal documentation       Materials Provided: No  Follow Up Plan: LCSW to call client in next 3 weeks to talk with client about stress issues related to management of health issues faced.  The patient verbalized understanding of instructions provided today and declined a print copy of patient instruction materials.   [name].Ardine Iacovelli MSW, LCSW Licensed Clinical Social Worker [facility] [phone]

## 2018-08-14 NOTE — Chronic Care Management (AMB) (Signed)
  Care Management Note   Kenneth Fuller is a 73 y.o. year old male who is a primary care patient of [name], Kenneth Kaufmann, MD. The CM team was consulted for assistance with chronic disease management and care coordination.   I reached out to Kenneth Fuller by phone today.   Review of patient status, including review of consultants reports, relevant laboratory and other test results, and collaboration with appropriate care team members and the patient's provider was performed as part of comprehensive patient evaluation and provision of chronic care management services.    Social Determinants of Health: Risk for Inactivity, physically; risk for tobacco use    Office Visit from 12/14/2017 in [place]  PHQ-9 Total Score  12     GAD 7 : Generalized Anxiety Score 11/11/2017  Nervous, Anxious, on Edge 3  Control/stop worrying 3  Worry too much - different things 3  Trouble relaxing 3  Restless 3  Easily annoyed or irritable 3  Afraid - awful might happen 0  Total GAD 7 Score 18  Anxiety Difficulty Somewhat difficult    Goals Addressed            This Visit's Progress   . Client has stress related to ongoing health issues and wishes to talk more about stress over current health issues (pt-stated)       Current Barriers:  Kenneth Fuller Kitchen Mental Health Concerns    .  Challenges related to managing health issues of client   Clinical Social Work Clinical Goal(s):  Kenneth Fuller Kitchen Over the next 30 days, client will work with LCSW to address concerns related to health issues of client and client management of health issues faced  Interventions: . Provided patient with information about CCM program services   . Encouraged client to communicate with RNCM to discuss nursing needs of client . Talked with client about his management of health issues faced by client  Patient Self Care Activities:  . Self administers medications as prescribed . Attends all scheduled provider appointments  . Performs ADL's independently   Plan:  LCSW to call client in next 3 weeks to talk with client about stress issues related to his management of health issues faced Client to communicate with RNCM to discuss nursing needs of client Client to attend scheduled client medical appointments   Initial goal documentation     Client said he received portable oxygen system last Friday from Kenneth Fuller.  He said he is using portable system around the house at present. LCSW encouraged Kenneth Fuller to talk with [facility] about nursing needs faced by client.   Client said he is taking antibiotic as prescribed. Client said he still has pain when urinating. He is sleeping adequately. He said he had his prescribed medications and was taking medications as prescribed. LCSW encouraged Kenneth Fuller to call LCSW as needed to discuss social work needs of client.  Follow Up Plan: LCSW to call client in next 3 weeks to talk with client about stress issues related to his management of health issues faced.  [name].Makailey Hodgkin MSW, LCSW Licensed Clinical Social Worker [facility] [phone]

## 2018-08-15 ENCOUNTER — Telehealth: Payer: Self-pay

## 2018-08-16 ENCOUNTER — Other Ambulatory Visit: Payer: Self-pay | Admitting: Family Medicine

## 2018-08-17 DIAGNOSIS — R0902 Hypoxemia: Secondary | ICD-10-CM | POA: Diagnosis not present

## 2018-08-17 DIAGNOSIS — R0600 Dyspnea, unspecified: Secondary | ICD-10-CM | POA: Diagnosis not present

## 2018-08-17 LAB — URINE CULTURE

## 2018-08-18 ENCOUNTER — Other Ambulatory Visit: Payer: Self-pay | Admitting: *Deleted

## 2018-08-18 MED ORDER — LISINOPRIL 20 MG PO TABS: 20.0000 mg | ORAL_TABLET | Freq: Every day | ORAL | 0 refills | Status: DC

## 2018-08-23 ENCOUNTER — Telehealth: Payer: Self-pay | Admitting: Family Medicine

## 2018-08-23 NOTE — Telephone Encounter (Signed)
Spoke with [employer] is ready for pick up Pt notified

## 2018-09-04 ENCOUNTER — Ambulatory Visit: Payer: Self-pay | Admitting: Licensed Clinical Social Worker

## 2018-09-04 DIAGNOSIS — G4700 Insomnia, unspecified: Secondary | ICD-10-CM

## 2018-09-04 DIAGNOSIS — K219 Gastro-esophageal reflux disease without esophagitis: Secondary | ICD-10-CM

## 2018-09-04 DIAGNOSIS — J449 Chronic obstructive pulmonary disease, unspecified: Secondary | ICD-10-CM

## 2018-09-04 DIAGNOSIS — E1142 Type 2 diabetes mellitus with diabetic polyneuropathy: Secondary | ICD-10-CM

## 2018-09-04 DIAGNOSIS — F411 Generalized anxiety disorder: Secondary | ICD-10-CM

## 2018-09-04 DIAGNOSIS — J441 Chronic obstructive pulmonary disease with (acute) exacerbation: Secondary | ICD-10-CM

## 2018-09-04 DIAGNOSIS — I10 Essential (primary) hypertension: Secondary | ICD-10-CM

## 2018-09-04 DIAGNOSIS — N183 Chronic kidney disease, stage 3 unspecified: Secondary | ICD-10-CM

## 2018-09-04 NOTE — Chronic Care Management (AMB) (Signed)
  Care Management Note   Kenneth Fuller is a 73 y.o. year old male who is a primary care patient of [name], [name], MD. The CM team was consulted for assistance with chronic disease management and care coordination.   I reached out to [name] by phone today.   Review of patient status, including review of consultants reports, relevant laboratory and other test results, and collaboration with appropriate care team members and the patient's provider was performed as part of comprehensive patient evaluation and provision of chronic care management services.   Social Determinants of Health: risk of tobacco use; risk of Stress; risk for depression   Office Visit from 12/14/2017 in [place]  PHQ-9 Total Score  12     GAD 7 : Generalized Anxiety Score 11/11/2017  Nervous, Anxious, on Edge 3  Control/stop worrying 3  Worry too much - different things 3  Trouble relaxing 3  Restless 3  Easily annoyed or irritable 3  Afraid - awful might happen 0  Total GAD 7 Score 18  Anxiety Difficulty Somewhat difficult   Goals        . Client has stress related to ongoing health issues and wishes to talk more about stress over current health issues (pt-stated)     Current Barriers:  [name] Mental Health Concerns    .  Challenges related to managing health issues of client   Clinical Social Work Clinical Goal(s):  [name] Over the next 30 days, client will work with LCSW to address concerns related to health issues of client and client management of health issues faced  Interventions: . Previously encouraged client to communicate with RNCM to discuss nursing needs of client . Previously talked with client about his management of health issues faced by client . Previously talked with client about relaxation techniques of choice  Patient Self Care Activities:  . Self administers medications as prescribed . Attends all scheduled provider appointments . Performs ADL's  independently   Plan:  LCSW to call client in next 3 weeks to talk with client about stress issues related to his management of health issues faced Client to communicate with RNCM to discuss nursing needs of client Client to attend scheduled client medical appointments   Initial goal documentation     Client previously said he received portable oxygen system recently from [place].  He had said he was using portable system around the house at present. LCSW encouraged [name] to talk with [facility] about nursing needs faced by client.  He is sleeping adequately. He said he had his prescribed medications and was taking medications as prescribed. LCSW encouraged [name] to call LCSW as needed to discuss social work needs of client.  Follow Up Plan: LCSW to call client in next 3 weeks to talk with client about stress issues related to management of health issues faced  [name].Zylee Marchiano MSW, LCSW Licensed Clinical Social Worker [facility] [phone]

## 2018-09-04 NOTE — Patient Instructions (Addendum)
Licensed Clinical Social Worker Visit Information  Goals we discussed today:  Goals        . Client has stress related to ongoing health issues and wishes to talk more about stress over current health issues (pt-stated)     Current Barriers:  [name] Mental Health Concerns    .  Challenges related to managing health issues of client   Clinical Social Work Clinical Goal(s):  [name] Over the next 30 days, client will work with LCSW to address concerns related to health issues of client and client management of health issues faced  Interventions: . Previously encouraged client to communicate with RNCM to discuss nursing needs of client .  Talked with client about his management of health issues faced by client . Previously talked with client about relaxation techniques of choice  Patient Self Care Activities:  . Self administers medications as prescribed . Attends all scheduled provider appointments . Performs ADL's independently   Plan:  LCSW to call client in next 3 weeks to talk with client about stress issues related to his management of health issues faced Client to communicate with RNCM to discuss nursing needs of client Client to attend scheduled client medical appointments   Initial goal documentation    .      Materials Provided: No  Follow Up Plan: LCSW to call client in next 3 weeks to talk with client about  stress issues related to management of health issues faced.  The patient verbalized understanding of instructions provided today and declined a print copy of patient instruction materials.   [name].Javier Mamone MSW, LCSW Licensed Clinical Social Worker [facility] [phone]

## 2018-09-11 DIAGNOSIS — I5032 Chronic diastolic (congestive) heart failure: Secondary | ICD-10-CM | POA: Diagnosis not present

## 2018-09-11 DIAGNOSIS — R0902 Hypoxemia: Secondary | ICD-10-CM | POA: Diagnosis not present

## 2018-09-11 DIAGNOSIS — R0600 Dyspnea, unspecified: Secondary | ICD-10-CM | POA: Diagnosis not present

## 2018-09-17 DIAGNOSIS — R0902 Hypoxemia: Secondary | ICD-10-CM | POA: Diagnosis not present

## 2018-09-17 DIAGNOSIS — R0600 Dyspnea, unspecified: Secondary | ICD-10-CM | POA: Diagnosis not present

## 2018-09-21 ENCOUNTER — Ambulatory Visit: Payer: PPO | Admitting: Family Medicine

## 2018-09-22 ENCOUNTER — Other Ambulatory Visit: Payer: Self-pay

## 2018-09-22 ENCOUNTER — Emergency Department (HOSPITAL_COMMUNITY): Payer: PPO

## 2018-09-22 ENCOUNTER — Ambulatory Visit (INDEPENDENT_AMBULATORY_CARE_PROVIDER_SITE_OTHER): Payer: PPO | Admitting: Licensed Clinical Social Worker

## 2018-09-22 ENCOUNTER — Inpatient Hospital Stay (HOSPITAL_COMMUNITY)
Admission: EM | Admit: 2018-09-22 | Discharge: 2018-09-28 | DRG: 291 | Disposition: A | Discharge status: Home / Self Care | Payer: PPO | Attending: Internal Medicine | Admitting: Internal Medicine

## 2018-09-22 ENCOUNTER — Telehealth: Payer: Self-pay | Admitting: *Deleted

## 2018-09-22 ENCOUNTER — Encounter (HOSPITAL_COMMUNITY): Payer: Self-pay | Admitting: Emergency Medicine

## 2018-09-22 DIAGNOSIS — E876 Hypokalemia: Secondary | ICD-10-CM | POA: Diagnosis not present

## 2018-09-22 DIAGNOSIS — I10 Essential (primary) hypertension: Secondary | ICD-10-CM

## 2018-09-22 DIAGNOSIS — D649 Anemia, unspecified: Secondary | ICD-10-CM | POA: Diagnosis not present

## 2018-09-22 DIAGNOSIS — E1142 Type 2 diabetes mellitus with diabetic polyneuropathy: Secondary | ICD-10-CM | POA: Diagnosis not present

## 2018-09-22 DIAGNOSIS — Z833 Family history of diabetes mellitus: Secondary | ICD-10-CM

## 2018-09-22 DIAGNOSIS — M109 Gout, unspecified: Secondary | ICD-10-CM | POA: Diagnosis present

## 2018-09-22 DIAGNOSIS — Z6841 Body Mass Index (BMI) 40.0 and over, adult: Secondary | ICD-10-CM | POA: Diagnosis not present

## 2018-09-22 DIAGNOSIS — Z885 Allergy status to narcotic agent status: Secondary | ICD-10-CM

## 2018-09-22 DIAGNOSIS — Z981 Arthrodesis status: Secondary | ICD-10-CM | POA: Diagnosis not present

## 2018-09-22 DIAGNOSIS — J449 Chronic obstructive pulmonary disease, unspecified: Secondary | ICD-10-CM | POA: Diagnosis present

## 2018-09-22 DIAGNOSIS — M797 Fibromyalgia: Secondary | ICD-10-CM | POA: Diagnosis present

## 2018-09-22 DIAGNOSIS — N183 Chronic kidney disease, stage 3 unspecified: Secondary | ICD-10-CM | POA: Diagnosis present

## 2018-09-22 DIAGNOSIS — I2510 Atherosclerotic heart disease of native coronary artery without angina pectoris: Secondary | ICD-10-CM | POA: Diagnosis present

## 2018-09-22 DIAGNOSIS — E1140 Type 2 diabetes mellitus with diabetic neuropathy, unspecified: Secondary | ICD-10-CM | POA: Diagnosis present

## 2018-09-22 DIAGNOSIS — K219 Gastro-esophageal reflux disease without esophagitis: Secondary | ICD-10-CM

## 2018-09-22 DIAGNOSIS — E1122 Type 2 diabetes mellitus with diabetic chronic kidney disease: Secondary | ICD-10-CM | POA: Diagnosis present

## 2018-09-22 DIAGNOSIS — J441 Chronic obstructive pulmonary disease with (acute) exacerbation: Secondary | ICD-10-CM | POA: Diagnosis not present

## 2018-09-22 DIAGNOSIS — R079 Chest pain, unspecified: Secondary | ICD-10-CM

## 2018-09-22 DIAGNOSIS — J9611 Chronic respiratory failure with hypoxia: Secondary | ICD-10-CM | POA: Diagnosis present

## 2018-09-22 DIAGNOSIS — E559 Vitamin D deficiency, unspecified: Secondary | ICD-10-CM | POA: Diagnosis not present

## 2018-09-22 DIAGNOSIS — F419 Anxiety disorder, unspecified: Secondary | ICD-10-CM | POA: Diagnosis present

## 2018-09-22 DIAGNOSIS — G4733 Obstructive sleep apnea (adult) (pediatric): Secondary | ICD-10-CM | POA: Diagnosis present

## 2018-09-22 DIAGNOSIS — Z841 Family history of disorders of kidney and ureter: Secondary | ICD-10-CM

## 2018-09-22 DIAGNOSIS — E1169 Type 2 diabetes mellitus with other specified complication: Secondary | ICD-10-CM | POA: Diagnosis present

## 2018-09-22 DIAGNOSIS — Z800 Family history of malignant neoplasm of digestive organs: Secondary | ICD-10-CM

## 2018-09-22 DIAGNOSIS — Z87891 Personal history of nicotine dependence: Secondary | ICD-10-CM

## 2018-09-22 DIAGNOSIS — I5032 Chronic diastolic (congestive) heart failure: Secondary | ICD-10-CM | POA: Diagnosis not present

## 2018-09-22 DIAGNOSIS — Z79899 Other long term (current) drug therapy: Secondary | ICD-10-CM

## 2018-09-22 DIAGNOSIS — I2723 Pulmonary hypertension due to lung diseases and hypoxia: Secondary | ICD-10-CM | POA: Diagnosis present

## 2018-09-22 DIAGNOSIS — F411 Generalized anxiety disorder: Secondary | ICD-10-CM

## 2018-09-22 DIAGNOSIS — I130 Hypertensive heart and chronic kidney disease with heart failure and stage 1 through stage 4 chronic kidney disease, or unspecified chronic kidney disease: Principal | ICD-10-CM | POA: Diagnosis present

## 2018-09-22 DIAGNOSIS — Z7951 Long term (current) use of inhaled steroids: Secondary | ICD-10-CM

## 2018-09-22 DIAGNOSIS — Z888 Allergy status to other drugs, medicaments and biological substances status: Secondary | ICD-10-CM

## 2018-09-22 DIAGNOSIS — R809 Proteinuria, unspecified: Secondary | ICD-10-CM | POA: Diagnosis not present

## 2018-09-22 DIAGNOSIS — I371 Nonrheumatic pulmonary valve insufficiency: Secondary | ICD-10-CM | POA: Diagnosis not present

## 2018-09-22 DIAGNOSIS — E785 Hyperlipidemia, unspecified: Secondary | ICD-10-CM | POA: Diagnosis present

## 2018-09-22 DIAGNOSIS — N179 Acute kidney failure, unspecified: Secondary | ICD-10-CM | POA: Diagnosis present

## 2018-09-22 DIAGNOSIS — Z20828 Contact with and (suspected) exposure to other viral communicable diseases: Secondary | ICD-10-CM | POA: Diagnosis present

## 2018-09-22 DIAGNOSIS — Z8249 Family history of ischemic heart disease and other diseases of the circulatory system: Secondary | ICD-10-CM

## 2018-09-22 DIAGNOSIS — I5033 Acute on chronic diastolic (congestive) heart failure: Secondary | ICD-10-CM | POA: Diagnosis present

## 2018-09-22 DIAGNOSIS — R0602 Shortness of breath: Secondary | ICD-10-CM | POA: Diagnosis not present

## 2018-09-22 DIAGNOSIS — G4700 Insomnia, unspecified: Secondary | ICD-10-CM

## 2018-09-22 DIAGNOSIS — R0989 Other specified symptoms and signs involving the circulatory and respiratory systems: Secondary | ICD-10-CM | POA: Diagnosis not present

## 2018-09-22 DIAGNOSIS — I2781 Cor pulmonale (chronic): Secondary | ICD-10-CM | POA: Diagnosis present

## 2018-09-22 DIAGNOSIS — F329 Major depressive disorder, single episode, unspecified: Secondary | ICD-10-CM | POA: Diagnosis present

## 2018-09-22 DIAGNOSIS — Z7984 Long term (current) use of oral hypoglycemic drugs: Secondary | ICD-10-CM

## 2018-09-22 DIAGNOSIS — E119 Type 2 diabetes mellitus without complications: Secondary | ICD-10-CM | POA: Diagnosis not present

## 2018-09-22 DIAGNOSIS — Z9981 Dependence on supplemental oxygen: Secondary | ICD-10-CM

## 2018-09-22 LAB — BASIC METABOLIC PANEL
Anion gap: 11 (ref 5–15)
BUN: 19 mg/dL (ref 8–23)
CO2: 28 mmol/L (ref 22–32)
Calcium: 9.1 mg/dL (ref 8.9–10.3)
Chloride: 102 mmol/L (ref 98–111)
Creatinine, Ser: 1.38 mg/dL — ABNORMAL HIGH (ref 0.61–1.24)
GFR calc Af Amer: 59 mL/min — ABNORMAL LOW (ref >60)
GFR calc non Af Amer: 51 mL/min — ABNORMAL LOW (ref >60)
Glucose, Bld: 110 mg/dL — ABNORMAL HIGH (ref 70–99)
Potassium: 4 mmol/L (ref 3.5–5.1)
Sodium: 141 mmol/L (ref 135–145)

## 2018-09-22 LAB — TROPONIN I (HIGH SENSITIVITY)
Troponin I (High Sensitivity): 6 ng/L (ref <18)
Troponin I (High Sensitivity): 6 ng/L (ref <18)

## 2018-09-22 LAB — CBC
HCT: 34.6 % — ABNORMAL LOW (ref 39.0–52.0)
Hemoglobin: 10.4 g/dL — ABNORMAL LOW (ref 13.0–17.0)
MCH: 24.6 pg — ABNORMAL LOW (ref 26.0–34.0)
MCHC: 30.1 g/dL (ref 30.0–36.0)
MCV: 82 fL (ref 80.0–100.0)
Platelets: 248 10*3/uL (ref 150–400)
RBC: 4.22 MIL/uL (ref 4.22–5.81)
RDW: 16.3 % — ABNORMAL HIGH (ref 11.5–15.5)
WBC: 9.1 10*3/uL (ref 4.0–10.5)
nRBC: 0 % (ref 0.0–0.2)

## 2018-09-22 LAB — BRAIN NATRIURETIC PEPTIDE: B Natriuretic Peptide: 176 pg/mL — ABNORMAL HIGH (ref 0.0–100.0)

## 2018-09-22 LAB — SARS CORONAVIRUS 2 BY RT PCR (HOSPITAL ORDER, PERFORMED IN ~~LOC~~ HOSPITAL LAB): SARS Coronavirus 2: NEGATIVE

## 2018-09-22 MED ORDER — NITROGLYCERIN 0.4 MG SL SUBL
0.4000 mg | SUBLINGUAL_TABLET | SUBLINGUAL | Status: AC | PRN
  Administered 2018-09-23 (×3): 0.4 mg via SUBLINGUAL
  Filled 2018-09-22 (×3): qty 1

## 2018-09-22 MED ORDER — IOHEXOL 350 MG/ML SOLN
100.0000 mL | Freq: Once | INTRAVENOUS | Status: AC | PRN
  Administered 2018-09-22: 100 mL via INTRAVENOUS

## 2018-09-22 MED ORDER — ASPIRIN 81 MG PO CHEW
324.0000 mg | CHEWABLE_TABLET | Freq: Once | ORAL | Status: AC
  Administered 2018-09-22: 21:00:00 324 mg via ORAL
  Filled 2018-09-22: qty 4

## 2018-09-22 MED ORDER — MORPHINE SULFATE (PF) 4 MG/ML IV SOLN
4.0000 mg | Freq: Once | INTRAVENOUS | Status: AC
  Administered 2018-09-23: 4 mg via INTRAVENOUS
  Filled 2018-09-22: qty 1

## 2018-09-22 NOTE — Chronic Care Management (AMB) (Signed)
Care Management Note   Kenneth Fuller is a 73 y.o. year old male who is a primary care patient of [name], Kenneth Kaufmann, MD. The CM team was consulted for assistance with chronic disease management and care coordination.   I reached out to Kenneth Fuller by phone today.   Review of patient status, including review of consultants reports, relevant laboratory and other test results, and collaboration with appropriate care team members and the patient's provider was performed as part of comprehensive patient evaluation and provision of chronic care management services.  Social Determinants of Health:risk of tobacco use; risk of physical inactivity    Office Visit from 12/14/2017 in [place]  PHQ-9 Total Score  12      GAD 7 : Generalized Anxiety Score 11/11/2017  Nervous, Anxious, on Edge 3  Control/stop worrying 3  Worry too much - different things 3  Trouble relaxing 3  Restless 3  Easily annoyed or irritable 3  Afraid - awful might happen 0  Total GAD 7 Score 18  Anxiety Difficulty Somewhat difficult   Goals    .     [name] Client has stress related to ongoing health issues and wishes to talk more about stress over current health issues (pt-stated)     Current Barriers:  [name] Mental Health Concerns    .  Challenges related to managing health issues of client   Clinical Social Work Clinical Goal(s):  [name] Over the next 30 days, client will work with LCSW to address concerns related to health issues of client and client management of health issues faced  Interventions: . Provided patient with information about CCM program services   . Encouraged client to communicate with RNCM to discuss nursing needs of client . Talked with client about his management of health issues faced by client  Patient Self Care Activities:  . Self administers medications as prescribed . Attends all scheduled provider appointments . Performs ADL's independently   Plan:  LCSW  to call client in next 3 weeks to talk with client about stress issues related to his management of health issues faced Client to communicate with RNCM to discuss nursing needs of client Client to attend scheduled client medical appointments   Initial goal documentation     Client said he has received portable oxygen system from [place].  He said he is using portable system around the house at present. LCSW encouraged Kenneth Fuller to talk with Kenneth Fuller about nursing needs faced by client.   He said he had his prescribed medications and was taking medications as prescribed. LCSW encouraged Kenneth Fuller to call LCSW as needed to discuss social work needs of client. Client said he only felt as if he got puffs of oxygen from portable oxygen system. He said he had been having pressure in his chest recently and felt that he had been more short of breath in recent days. LCSW informed client that LCSW would contact nurse at [facility] to contact patient today to discuss client symptoms. Client agreed to plan. LCSW talked with [name] Kenneth Fuller today and updated Kenneth Fuller on client symptoms. Kenneth Fuller called Kenneth Fuller today and spoke with client about client symptoms. Kenneth Fuller recommended to client today that client go immediately to emergency room for medical evaluation. Client said he did have a family member to transport him to emergency room. Plan was for client and family member to go immediately to emergency room for client to be medically evaluated. Client agreed  to this plan.   Follow Up Plan: LCSW to call client in next 3 weeks to talk with client about stress issues related to his management of health issues faced  Kenneth Fuller.Kenneth Fuller MSW, LCSW Licensed Clinical Social Worker [facility] [phone]

## 2018-09-22 NOTE — Telephone Encounter (Signed)
Spoke with pt regarding SOB Pt is having chest pressure with SOB Chest pressure is "pretty bad at times" Advised pt to go to ER for evaluation Pt verbalizes understanding

## 2018-09-22 NOTE — ED Triage Notes (Signed)
Patient has COPD, starting having increased shortness of breath and chest pain on Tuesday evening, inhaler and breathing treatments, didn't decrease shortness of breath.

## 2018-09-22 NOTE — ED Provider Notes (Signed)
[facility] EMERGENCY DEPARTMENT Provider Note   CSN: [id number] Arrival date & time: 09/22/18  1656     History   Chief Complaint Chief Complaint  Patient presents with  . Chest Pain  . Shortness of Breath    HPI Kenneth Fuller is a 73 y.o. male.     Pt complains of shortness of breath and pain on the left side of his chest since Tuesday.  Pt reports he drove to [place] on Tuesday and stayed at his daughter house.  Pt reports he had his portable 02 but felt more short of breath. Pt thought it could be portable 02.  Pt states he has continued to feel short of breath.  Pt has had CHF in the past,  He does not have any extremity swelling   The history is provided by the patient. No language interpreter was used.  Shortness of Breath Pt has a history of hypertension, high cholesterol and diabetes.  Pt reports he has been having chest pain since Tuesday.  Pt is on home 02 but feels more short of breath than usual. Exertion causes increased shortness of breath.  Pt denies any history of coronary artery disease.  Pt had a cardiac cath in 11/2016. 10% stenosis of mid lad and mid rca. Pt's last EF was 45-50 12/2017.    Past Medical History:  Diagnosis Date  . Allergy   . Asthma   . CAD (coronary artery disease)   . Diabetes mellitus   . Fibromyalgia   . GERD (gastroesophageal reflux disease)   . GI bleeding   . Gout   . Hyperlipidemia   . Hypertension   . Hypogonadism male   . Insomnia   . MRSA cellulitis   . Neuropathy   . Obesity   . Shortness of breath dyspnea    with exertion   . Sleep apnea    cpap- 14   . Wheezing    no asthma diagnosis    Patient Active Problem List   Diagnosis Date Noted  . Insomnia   . CHF (congestive heart failure) ([place]) 01/06/2018  . Depression with anxiety 01/04/2018  . CKD (chronic kidney disease), stage III ([place]) 01/04/2018  . Chronic obstructive pulmonary disease ([place]) 01/04/2018  . Depression, recurrent ([place]) 11/11/2017  .  Chronic diastolic heart failure ([place]) 04/13/2017  . Swelling of lower extremity 11/30/2016  . Obesity, Class III, BMI 40-49.9 (morbid obesity) ([place]) 08/21/2015  . Coronary artery disease involving native coronary artery of native heart without angina pectoris 12/05/2014  . Urinary urgency 10/15/2014  . Spinal stenosis, lumbar region, with neurogenic claudication 01/02/2014  . Hyperlipidemia associated with type 2 diabetes mellitus ([place]) 10/25/2013  . COLONIC POLYPS, ADENOMATOUS 02/25/2007  . Diabetes mellitus type II, non insulin dependent ([place]) 02/25/2007  . Gout 02/25/2007  . Essential hypertension 02/25/2007  . Cough variant asthma 02/25/2007  . OSA on CPAP 02/25/2007  . FATIGUE, CHRONIC 02/25/2007  . HEADACHE, CHRONIC 02/25/2007  . GERD (gastroesophageal reflux disease) 02/25/2007    Past Surgical History:  Procedure Laterality Date  . BACK SURGERY    . CARDIAC CATHETERIZATION    . COLONOSCOPY    . LEFT HEART CATH AND CORONARY ANGIOGRAPHY N/A 12/02/2016   Procedure: LEFT HEART CATH AND CORONARY ANGIOGRAPHY;  Surgeon: Jettie Booze, MD;  Location: [facility];  Service: Cardiovascular;  Laterality: N/A;  . LUMBAR LAMINECTOMY/DECOMPRESSION MICRODISCECTOMY N/A 01/02/2014   Procedure: CENTRAL DECOMPRESSION LUMBAR LAMINECTOMY L3-L4, L4-L5;  Surgeon: Tobi Bastos,  MD;  Location: [facility];  Service: Orthopedics;  Laterality: N/A;  . neck fusion          Home Medications    Prior to Admission medications   Medication Sig Start Date End Date Taking? Authorizing Provider  albuterol (PROVENTIL HFA;VENTOLIN HFA) 108 (90 Base) MCG/ACT inhaler Inhale 2 puffs into the lungs every 6 (six) hours as needed for wheezing or shortness of breath. 07/20/17   Brand Males, MD  atorvastatin (LIPITOR) 40 MG tablet Take 1 tablet by mouth once daily 08/03/18   [name], [name], MD  bisoprolol (ZEBETA) 5 MG tablet Take 0.5 tablets (2.5 mg total) by mouth daily. 01/13/18   Croitoru,  Mihai, MD  Blood Glucose Monitoring Suppl (ACCU-CHEK AVIVA) device Use as instructed 04/18/18 04/18/19  [name], [name], MD  Blood Pressure Monitoring (BLOOD PRESSURE CUFF) MISC 1 each by Does not apply route daily. 05/22/18   [name], [name], MD  budesonide-formoterol (SYMBICORT) 160-4.5 MCG/ACT inhaler Inhale 2 puffs into the lungs 2 (two) times daily. 01/09/18   Barton Dubois, MD  buPROPion (WELLBUTRIN XL) 150 MG 24 hr tablet Take 1 tablet by mouth once daily 05/22/18   [name], [name], MD  Cholecalciferol (VITAMIN D PO) Take 5,000 Units by mouth every morning.     [provider]  clonazePAM (KLONOPIN) 0.5 MG tablet TAKE 1 TABLET BY MOUTH AT BEDTIME AS NEEDED 08/03/18   [name], [name], MD  furosemide (LASIX) 40 MG tablet Take 2 tablets (80 mg total) by mouth every morning. Take extra 40 mg tablet as directed. 02/13/18   Croitoru, Mihai, MD  glimepiride (AMARYL) 4 MG tablet TAKE 1 TABLET BY MOUTH WITH BREAKFAST 01/18/18   [name], [name], MD  glucose blood (ACCU-CHEK AVIVA) test strip 1 each by Other route daily. Use as instructed 04/18/18   [name], [name], MD  hydrALAZINE (APRESOLINE) 25 MG tablet Take 1 tablet (25 mg total) by mouth 3 (three) times daily. 02/03/18 02/03/19  Almyra Deforest, PA  isosorbide dinitrate (ISORDIL) 30 MG tablet Take 1 tablet (30 mg total) by mouth 3 (three) times daily. 01/13/18   Croitoru, Mihai, MD  Lancets (ACCU-CHEK SOFT TOUCH) lancets 1 each by Other route daily. Use as instructed 04/18/18   [name], [name], MD  lisinopril (ZESTRIL) 20 MG tablet Take 1 tablet (20 mg total) by mouth daily. 08/18/18   [name], [name], MD  Misc. Devices (PULSE OXIMETER) MISC 1 each by Does not apply route continuous as needed. 07/26/18   [name], [name], MD  [facility] DELICA LANCETS 91M MISC 1 each by Does not apply route daily. Test 1X per day and prn 03/30/17   [name], [name], MD  OXYGEN Inhale 2 L into the lungs at bedtime. 2lpm with sleep      [provider]  Potassium Chloride ER 20 MEQ TBCR Take 1 tablet by mouth daily. 08/08/18   Almyra Deforest, PA  potassium chloride SA (K-DUR,KLOR-CON) 20 MEQ tablet Take 20 mEq by mouth daily.    [provider]  PRODIGY NO CODING BLOOD GLUC test strip CHECK BLOOD SUGARS ONCE DAILY 01/17/18   [name], [name], MD  sertraline (ZOLOFT) 50 MG tablet Take 1 tablet by mouth once daily 05/05/18   [name], [name], MD  sulfamethoxazole-trimethoprim (BACTRIM DS) 800-160 MG tablet Take 1 tablet by mouth 2 (two) times daily. 08/11/18   Hassell Done, Mary-Margaret, FNP  zolpidem (AMBIEN CR) 12.5 MG CR tablet TAKE 1 TABLET BY MOUTH AT BEDTIME AS  NEEDED FOR SLEEP 08/17/18   [name], [name], MD    Family History Family History  Problem Relation Age of Onset  . Colon cancer Mother   . Diabetes Father        siblings  . Heart disease Father        brother  . Heart attack Father   . Kidney disease Sister   . Heart failure Sister   . Heart disease Brother   . Heart attack Son 16  . Drug abuse Sister   . Heart disease Brother   . Deep vein thrombosis Brother   . Colon polyps Neg Hx     Social History Social History   Tobacco Use  . Smoking status: Former Smoker    Quit date: 02/08/1969    Years since quitting: 49.6  . Smokeless tobacco: Former Systems developer    Quit date: 1971  Substance Use Topics  . Alcohol use: No    Alcohol/week: 0.0 standard drinks  . Drug use: No     Allergies   Amlodipine besy-benazepril hcl, Oxycodone, Phenergan [promethazine hcl], and Hydrocodone   Review of Systems Review of Systems  All other systems reviewed and are negative.    Physical Exam Updated Vital Signs BP (!) 181/85 (BP Location: Left Arm)   Pulse 65   Temp 97.9 F (36.6 C) (Oral)   Resp 15   Ht _0  (1.702 m)   Wt 127 kg   SpO2 100%   BMI 43.85 kg/m   Physical Exam Vitals signs and nursing note reviewed.  Constitutional:      Appearance: He is well-developed.  HENT:      Head: Normocephalic and atraumatic.  Eyes:     Conjunctiva/sclera: Conjunctivae normal.  Neck:     Musculoskeletal: Normal range of motion and neck supple.  Cardiovascular:     Rate and Rhythm: Normal rate and regular rhythm.     Heart sounds: Normal heart sounds. No murmur.  Pulmonary:     Effort: Pulmonary effort is normal. No respiratory distress.     Breath sounds: Normal breath sounds.  Abdominal:     Palpations: Abdomen is soft.     Tenderness: There is no abdominal tenderness.  Skin:    General: Skin is warm and dry.  Neurological:     General: No focal deficit present.     Mental Status: He is alert.      ED Treatments / Results  Labs (all labs ordered are listed, but only abnormal results are displayed) Labs Reviewed  BASIC METABOLIC PANEL - Abnormal; Notable for the following components:      Result Value   Glucose, Bld 110 (*)    Creatinine, Ser 1.38 (*)    GFR calc non Af Amer 51 (*)    GFR calc Af Amer 59 (*)    All other components within normal limits  CBC - Abnormal; Notable for the following components:   Hemoglobin 10.4 (*)    HCT 34.6 (*)    MCH 24.6 (*)    RDW 16.3 (*)    All other components within normal limits  BRAIN NATRIURETIC PEPTIDE - Abnormal; Notable for the following components:   B Natriuretic Peptide 176.0 (*)    All other components within normal limits  TROPONIN I (HIGH SENSITIVITY)  TROPONIN I (HIGH SENSITIVITY)    EKG EKG Interpretation  Date/Time:  Friday September 22 2018 17:22:48 EDT Ventricular Rate:  66 PR Interval:    QRS Duration: 105 QT  Interval:  442 QTC Calculation: 464 R Axis:   59 Text Interpretation:  Sinus rhythm When compared with ECG of 01/04/2018 Rate slower Confirmed by Francine Graven [phone]) on 09/22/2018 5:52:36 PM   Radiology Dg Chest Port 1 View  Result Date: 09/22/2018 CLINICAL DATA:  Increased short of breath EXAM: PORTABLE CHEST 1 VIEW COMPARISON:  01/04/2018 FINDINGS: Hardware in the cervical  spine. Borderline cardiomegaly. Mild central congestion without acute acute focal opacity or pleural effusion. Aortic atherosclerosis. No pneumothorax. IMPRESSION: Borderline to mild cardiomegaly with slight central congestion Electronically Signed   By: Donavan Foil M.D.   On: 09/22/2018 18:05    Procedures Procedures (including critical care time)  Medications Ordered in ED Medications - No data to display   Initial Impression / Assessment and Plan / ED Course  I have reviewed the triage vital signs and the nursing notes.  Pertinent labs & imaging results that were available during my care of the patient were reviewed by me and considered in my medical decision making (see chart for details).        MDM   Pt's troponin is negative.  Ct scan of chest pending.  Pt's care turned over to [place] pending  Final Clinical Impressions(s) / ED Diagnoses   Final diagnoses:  Chest pain    ED Discharge Orders    None       Sidney Ace 09/22/18 2158    Francine Graven, DO 09/27/18 1347

## 2018-09-22 NOTE — Patient Instructions (Addendum)
Licensed Clinical Water engineer Provided: No   Client said he has received portable oxygen system from [employer]. He said he is using portable system around the house at present. LCSW encouraged [place] to [place] about nursing needs faced by client.He said he had his prescribed medications and was taking medications as prescribed. LCSW encouraged [place] to call LCSW as needed to discuss social work needs of client. Client said he only felt as if he got puffs of oxygen from portable oxygen system. He said he had been having pressure in his chest recently and felt that he had been more short of breath in recent days. LCSW informed client that LCSW would contact nurse at [facility] to contact patient today to discuss client symptoms. Client agreed to plan. LCSW talked with [facility] Triage Nurse [name] today and updated [name] on client symptoms. [name] called [name] today and spoke with client about client symptoms. [name] recommended to client today that client go immediately to emergency room for medical evaluation. Client said he did have a family member to transport him to emergency room. Plan was for client and family member to go immediately to emergency room for client to be medically evaluated. Client agreed to this plan.   Follow Up Plan: LCSW to call client in next 3 weeks to talk with client about stress issues related to management of his health condition  The patient verbalized understanding of instructions provided today and declined a print copy of patient instruction materials.   [name].Olvin Rohr MSW, LCSW Licensed Clinical Social Worker [facility] [phone]

## 2018-09-23 ENCOUNTER — Encounter (HOSPITAL_COMMUNITY): Payer: Self-pay | Admitting: Internal Medicine

## 2018-09-23 DIAGNOSIS — I5032 Chronic diastolic (congestive) heart failure: Secondary | ICD-10-CM | POA: Diagnosis not present

## 2018-09-23 DIAGNOSIS — J9611 Chronic respiratory failure with hypoxia: Secondary | ICD-10-CM | POA: Diagnosis present

## 2018-09-23 DIAGNOSIS — N179 Acute kidney failure, unspecified: Secondary | ICD-10-CM | POA: Diagnosis present

## 2018-09-23 DIAGNOSIS — I5033 Acute on chronic diastolic (congestive) heart failure: Secondary | ICD-10-CM | POA: Diagnosis present

## 2018-09-23 DIAGNOSIS — I2510 Atherosclerotic heart disease of native coronary artery without angina pectoris: Secondary | ICD-10-CM

## 2018-09-23 DIAGNOSIS — G4733 Obstructive sleep apnea (adult) (pediatric): Secondary | ICD-10-CM | POA: Diagnosis present

## 2018-09-23 DIAGNOSIS — I2723 Pulmonary hypertension due to lung diseases and hypoxia: Secondary | ICD-10-CM | POA: Diagnosis present

## 2018-09-23 DIAGNOSIS — K219 Gastro-esophageal reflux disease without esophagitis: Secondary | ICD-10-CM | POA: Diagnosis present

## 2018-09-23 DIAGNOSIS — R079 Chest pain, unspecified: Secondary | ICD-10-CM | POA: Diagnosis present

## 2018-09-23 DIAGNOSIS — Z981 Arthrodesis status: Secondary | ICD-10-CM | POA: Diagnosis not present

## 2018-09-23 DIAGNOSIS — E1122 Type 2 diabetes mellitus with diabetic chronic kidney disease: Secondary | ICD-10-CM | POA: Diagnosis present

## 2018-09-23 DIAGNOSIS — M109 Gout, unspecified: Secondary | ICD-10-CM | POA: Diagnosis present

## 2018-09-23 DIAGNOSIS — I371 Nonrheumatic pulmonary valve insufficiency: Secondary | ICD-10-CM | POA: Diagnosis not present

## 2018-09-23 DIAGNOSIS — Z20828 Contact with and (suspected) exposure to other viral communicable diseases: Secondary | ICD-10-CM | POA: Diagnosis present

## 2018-09-23 DIAGNOSIS — J449 Chronic obstructive pulmonary disease, unspecified: Secondary | ICD-10-CM | POA: Diagnosis present

## 2018-09-23 DIAGNOSIS — F329 Major depressive disorder, single episode, unspecified: Secondary | ICD-10-CM | POA: Diagnosis present

## 2018-09-23 DIAGNOSIS — N183 Chronic kidney disease, stage 3 (moderate): Secondary | ICD-10-CM

## 2018-09-23 DIAGNOSIS — Z6841 Body Mass Index (BMI) 40.0 and over, adult: Secondary | ICD-10-CM | POA: Diagnosis not present

## 2018-09-23 DIAGNOSIS — I2781 Cor pulmonale (chronic): Secondary | ICD-10-CM | POA: Diagnosis present

## 2018-09-23 DIAGNOSIS — E785 Hyperlipidemia, unspecified: Secondary | ICD-10-CM | POA: Diagnosis present

## 2018-09-23 DIAGNOSIS — E119 Type 2 diabetes mellitus without complications: Secondary | ICD-10-CM | POA: Diagnosis not present

## 2018-09-23 DIAGNOSIS — E876 Hypokalemia: Secondary | ICD-10-CM | POA: Diagnosis not present

## 2018-09-23 DIAGNOSIS — E1140 Type 2 diabetes mellitus with diabetic neuropathy, unspecified: Secondary | ICD-10-CM | POA: Diagnosis present

## 2018-09-23 DIAGNOSIS — M797 Fibromyalgia: Secondary | ICD-10-CM | POA: Diagnosis present

## 2018-09-23 DIAGNOSIS — I130 Hypertensive heart and chronic kidney disease with heart failure and stage 1 through stage 4 chronic kidney disease, or unspecified chronic kidney disease: Secondary | ICD-10-CM | POA: Diagnosis present

## 2018-09-23 DIAGNOSIS — E1169 Type 2 diabetes mellitus with other specified complication: Secondary | ICD-10-CM | POA: Diagnosis present

## 2018-09-23 DIAGNOSIS — F419 Anxiety disorder, unspecified: Secondary | ICD-10-CM | POA: Diagnosis present

## 2018-09-23 LAB — GLUCOSE, CAPILLARY
Glucose-Capillary: 84 mg/dL (ref 70–99)
Glucose-Capillary: 98 mg/dL (ref 70–99)
Glucose-Capillary: 116 mg/dL — ABNORMAL HIGH (ref 70–99)
Glucose-Capillary: 129 mg/dL — ABNORMAL HIGH (ref 70–99)

## 2018-09-23 LAB — CBG MONITORING, ED
Glucose-Capillary: 87 mg/dL (ref 70–99)
Glucose-Capillary: 88 mg/dL (ref 70–99)

## 2018-09-23 MED ORDER — FUROSEMIDE 10 MG/ML IJ SOLN
60.0000 mg | Freq: Three times a day (TID) | INTRAMUSCULAR | Status: DC
  Administered 2018-09-23 – 2018-09-24 (×3): 60 mg via INTRAVENOUS
  Filled 2018-09-23 (×3): qty 6

## 2018-09-23 MED ORDER — ZOLPIDEM TARTRATE 5 MG PO TABS
5.0000 mg | ORAL_TABLET | Freq: Every evening | ORAL | Status: DC | PRN
  Administered 2018-09-24 – 2018-09-27 (×4): 5 mg via ORAL
  Filled 2018-09-23 (×5): qty 1

## 2018-09-23 MED ORDER — MOMETASONE FURO-FORMOTEROL FUM 200-5 MCG/ACT IN AERO
2.0000 | INHALATION_SPRAY | Freq: Two times a day (BID) | RESPIRATORY_TRACT | Status: DC
  Administered 2018-09-23 – 2018-09-28 (×11): 2 via RESPIRATORY_TRACT
  Filled 2018-09-23: qty 8.8

## 2018-09-23 MED ORDER — ENOXAPARIN SODIUM 80 MG/0.8ML ~~LOC~~ SOLN
0.5000 mg/kg | SUBCUTANEOUS | Status: DC
  Administered 2018-09-23: 65 mg via SUBCUTANEOUS
  Filled 2018-09-23: qty 0.8

## 2018-09-23 MED ORDER — SODIUM CHLORIDE 0.9 % IV SOLN: INTRAVENOUS | Status: DC

## 2018-09-23 MED ORDER — LISINOPRIL 20 MG PO TABS
20.0000 mg | ORAL_TABLET | Freq: Every day | ORAL | Status: DC
  Administered 2018-09-23: 20 mg via ORAL
  Filled 2018-09-23: qty 1

## 2018-09-23 MED ORDER — BUPROPION HCL ER (XL) 150 MG PO TB24
150.0000 mg | ORAL_TABLET | Freq: Every day | ORAL | Status: DC
  Administered 2018-09-23 – 2018-09-28 (×6): 150 mg via ORAL
  Filled 2018-09-23 (×9): qty 1

## 2018-09-23 MED ORDER — ACETAMINOPHEN 650 MG RE SUPP: 650.0000 mg | Freq: Four times a day (QID) | RECTAL | Status: DC | PRN

## 2018-09-23 MED ORDER — CLONAZEPAM 0.5 MG PO TABS
0.5000 mg | ORAL_TABLET | Freq: Every evening | ORAL | Status: DC | PRN
  Administered 2018-09-23: 0.5 mg via ORAL
  Filled 2018-09-23: qty 1

## 2018-09-23 MED ORDER — ISOSORBIDE DINITRATE 10 MG PO TABS
30.0000 mg | ORAL_TABLET | Freq: Three times a day (TID) | ORAL | Status: DC
  Administered 2018-09-23 – 2018-09-26 (×10): 30 mg via ORAL
  Filled 2018-09-23 (×3): qty 1
  Filled 2018-09-23: qty 3
  Filled 2018-09-23: qty 1
  Filled 2018-09-23: qty 3
  Filled 2018-09-23 (×2): qty 1
  Filled 2018-09-23 (×7): qty 3
  Filled 2018-09-23: qty 1
  Filled 2018-09-23: qty 3

## 2018-09-23 MED ORDER — POTASSIUM CHLORIDE CRYS ER 20 MEQ PO TBCR
20.0000 meq | EXTENDED_RELEASE_TABLET | Freq: Every day | ORAL | Status: DC
  Administered 2018-09-23: 20 meq via ORAL
  Filled 2018-09-23: qty 1

## 2018-09-23 MED ORDER — HYDRALAZINE HCL 25 MG PO TABS
25.0000 mg | ORAL_TABLET | Freq: Three times a day (TID) | ORAL | Status: DC
  Administered 2018-09-23 – 2018-09-24 (×4): 25 mg via ORAL
  Filled 2018-09-23 (×4): qty 1

## 2018-09-23 MED ORDER — ALBUTEROL SULFATE HFA 108 (90 BASE) MCG/ACT IN AERS
2.0000 | INHALATION_SPRAY | Freq: Four times a day (QID) | RESPIRATORY_TRACT | Status: DC | PRN
  Filled 2018-09-23: qty 6.7

## 2018-09-23 MED ORDER — ENOXAPARIN SODIUM 40 MG/0.4ML ~~LOC~~ SOLN: 40.0000 mg | SUBCUTANEOUS | Status: DC

## 2018-09-23 MED ORDER — GUAIFENESIN 100 MG/5ML PO SOLN
5.0000 mL | ORAL | Status: DC | PRN
  Administered 2018-09-23 – 2018-09-24 (×3): 100 mg via ORAL
  Filled 2018-09-23 (×3): qty 5

## 2018-09-23 MED ORDER — INSULIN ASPART 100 UNIT/ML ~~LOC~~ SOLN
0.0000 [IU] | SUBCUTANEOUS | Status: DC
  Administered 2018-09-24: 12:00:00 2 [IU] via SUBCUTANEOUS
  Administered 2018-09-24: 19:00:00 1 [IU] via SUBCUTANEOUS
  Administered 2018-09-24: 2 [IU] via SUBCUTANEOUS
  Administered 2018-09-25: 09:00:00 1 [IU] via SUBCUTANEOUS
  Administered 2018-09-25: 2 [IU] via SUBCUTANEOUS
  Administered 2018-09-26: 3 [IU] via SUBCUTANEOUS
  Administered 2018-09-27 – 2018-09-28 (×2): 1 [IU] via SUBCUTANEOUS

## 2018-09-23 MED ORDER — HEPARIN SODIUM (PORCINE) 5000 UNIT/ML IJ SOLN
5000.0000 [IU] | Freq: Three times a day (TID) | INTRAMUSCULAR | Status: DC
  Administered 2018-09-23 – 2018-09-28 (×8): 5000 [IU] via SUBCUTANEOUS
  Filled 2018-09-23 (×8): qty 1

## 2018-09-23 MED ORDER — VITAMIN D 25 MCG (1000 UNIT) PO TABS
5000.0000 [IU] | ORAL_TABLET | Freq: Every day | ORAL | Status: DC
  Administered 2018-09-23 – 2018-09-28 (×6): 5000 [IU] via ORAL
  Filled 2018-09-23 (×6): qty 5

## 2018-09-23 MED ORDER — SERTRALINE HCL 50 MG PO TABS
50.0000 mg | ORAL_TABLET | Freq: Every day | ORAL | Status: DC
  Administered 2018-09-23 – 2018-09-28 (×6): 50 mg via ORAL
  Filled 2018-09-23 (×6): qty 1

## 2018-09-23 MED ORDER — FUROSEMIDE 80 MG PO TABS
80.0000 mg | ORAL_TABLET | Freq: Every day | ORAL | Status: DC
  Administered 2018-09-23: 10:00:00 80 mg via ORAL
  Filled 2018-09-23: qty 1

## 2018-09-23 MED ORDER — BISOPROLOL FUMARATE 5 MG PO TABS
2.5000 mg | ORAL_TABLET | Freq: Every day | ORAL | Status: DC
  Administered 2018-09-23 – 2018-09-28 (×6): 2.5 mg via ORAL
  Filled 2018-09-23 (×2): qty 1
  Filled 2018-09-23: qty 0.5
  Filled 2018-09-23 (×4): qty 1
  Filled 2018-09-23 (×2): qty 0.5

## 2018-09-23 MED ORDER — ASPIRIN 325 MG PO TABS: 325.0000 mg | ORAL_TABLET | Freq: Every day | ORAL | Status: DC

## 2018-09-23 MED ORDER — ATORVASTATIN CALCIUM 40 MG PO TABS
40.0000 mg | ORAL_TABLET | Freq: Every day | ORAL | Status: DC
  Administered 2018-09-23 – 2018-09-28 (×6): 40 mg via ORAL
  Filled 2018-09-23 (×6): qty 1

## 2018-09-23 MED ORDER — ACETAMINOPHEN 325 MG PO TABS
650.0000 mg | ORAL_TABLET | Freq: Four times a day (QID) | ORAL | Status: DC | PRN
  Administered 2018-09-26: 21:00:00 650 mg via ORAL
  Filled 2018-09-23: qty 2

## 2018-09-23 MED ORDER — ALBUTEROL SULFATE (2.5 MG/3ML) 0.083% IN NEBU: 2.5000 mg | INHALATION_SOLUTION | Freq: Four times a day (QID) | RESPIRATORY_TRACT | Status: DC | PRN

## 2018-09-23 NOTE — Plan of Care (Signed)
  Problem: Education: Goal: Knowledge of General Education information will improve Description: Including pain rating scale, medication(s)/side effects and non-pharmacologic comfort measures Outcome: Progressing

## 2018-09-23 NOTE — ED Notes (Signed)
Moved onto hospital bed.

## 2018-09-23 NOTE — ED Provider Notes (Signed)
Patient signed out to me by Marcene Brawn, PA-C at end of shift.  CT scan of chest pain pending.  Kenneth Fuller is a 73 year old male with complaints of left-sided chest pain since Tuesday.  He is oxygen dependent at home and also has a history of CHF.  His high-sensitivity troponins were within normal limits and CT angiogram of the chest is negative for PE.  Patient continues to have left-sided chest pain despite aspirin and morphine here.  It is felt that he needs hospital admission for further work-up of his ongoing chest pain.  I will consult hospitalist.  [phone]  Consulted Dr. Maudie Mercury who agrees to admit and arrange transfer to [facility] given that no cardiology is available her on weekend        Results for orders placed or performed during the hospital encounter of 09/22/18  SARS Coronavirus 2 [facility] order, Performed in [facility] hospital lab) Nasopharyngeal Nasopharyngeal Swab   Specimen: Nasopharyngeal Swab  Result Value Ref Range   SARS Coronavirus 2 NEGATIVE NEGATIVE  Basic metabolic panel  Result Value Ref Range   Sodium 141 135 - 145 mmol/L   Potassium 4.0 3.5 - 5.1 mmol/L   Chloride 102 98 - 111 mmol/L   CO2 28 22 - 32 mmol/L   Glucose, Bld 110 (H) 70 - 99 mg/dL   BUN 19 8 - 23 mg/dL   Creatinine, Ser 1.38 (H) 0.61 - 1.24 mg/dL   Calcium 9.1 8.9 - 10.3 mg/dL   GFR calc non Af Amer 51 (L) >60 mL/min   GFR calc Af Amer 59 (L) >60 mL/min   Anion gap 11 5 - 15  CBC  Result Value Ref Range   WBC 9.1 4.0 - 10.5 K/uL   RBC 4.22 4.22 - 5.81 MIL/uL   Hemoglobin 10.4 (L) 13.0 - 17.0 g/dL   HCT 34.6 (L) 39.0 - 52.0 %   MCV 82.0 80.0 - 100.0 fL   MCH 24.6 (L) 26.0 - 34.0 pg   MCHC 30.1 30.0 - 36.0 g/dL   RDW 16.3 (H) 11.5 - 15.5 %   Platelets 248 150 - 400 K/uL   nRBC 0.0 0.0 - 0.2 %  Brain natriuretic peptide  Result Value Ref Range   B Natriuretic Peptide 176.0 (H) 0.0 - 100.0 pg/mL  Troponin I (High Sensitivity)  Result Value Ref Range   Troponin I (High Sensitivity) 6  <18 ng/L  Troponin I (High Sensitivity)  Result Value Ref Range   Troponin I (High Sensitivity) 6 <18 ng/L   Ct Angio Chest Pe W And/or Wo Contrast  Result Date: 09/23/2018 CLINICAL DATA:  73 year old male with COPD presenting with increased shortness of breath. EXAM: CT ANGIOGRAPHY CHEST WITH CONTRAST TECHNIQUE: Multidetector CT imaging of the chest was performed using the standard protocol during bolus administration of intravenous contrast. Multiplanar CT image reconstructions and MIPs were obtained to evaluate the vascular anatomy. CONTRAST:  186m OMNIPAQUE IOHEXOL 350 MG/ML SOLN COMPARISON:  Chest CT dated 07/04/2017 FINDINGS: Cardiovascular: Stable cardiomegaly. No pericardial effusion. Mild atherosclerotic calcification of the thoracic aorta. There is no CT evidence of pulmonary embolism. Mediastinum/Nodes: No hilar or mediastinal adenopathy. The esophagus and the thyroid gland are grossly unremarkable. No mediastinal fluid collection. Lungs/Pleura: Minimal left lung base linear atelectasis. There is no focal consolidation, pleural effusion, or pneumothorax. The central airways are patent. Upper Abdomen: No acute abnormality. Musculoskeletal: Degenerative changes of the spine. No acute osseous pathology partially visualized lower cervical ACDF. Review of the MIP images confirms  the above findings. IMPRESSION: 1. No acute intrathoracic pathology. No CT evidence of pulmonary embolism. 2. Stable cardiomegaly. 3. Aortic Atherosclerosis (ICD10-I70.0). Electronically Signed   By: Anner Crete M.D.   On: 09/23/2018 00:08   Dg Chest Port 1 View  Result Date: 09/22/2018 CLINICAL DATA:  Increased short of breath EXAM: PORTABLE CHEST 1 VIEW COMPARISON:  01/04/2018 FINDINGS: Hardware in the cervical spine. Borderline cardiomegaly. Mild central congestion without acute acute focal opacity or pleural effusion. Aortic atherosclerosis. No pneumothorax. IMPRESSION: Borderline to mild cardiomegaly with slight  central congestion Electronically Signed   By: Donavan Foil M.D.   On: 09/22/2018 18:05      Kenneth Parkinson, PA-C 09/23/18 [place], [place], DO 09/27/18 1348

## 2018-09-23 NOTE — ED Notes (Signed)
Pt states he is having mild pain and requesting nitro.  Pain 3/10 prior to ntg given.

## 2018-09-23 NOTE — ED Notes (Signed)
Report given to Beatrice at [facility].

## 2018-09-23 NOTE — H&P (Signed)
TRH H&P    Patient Demographics:    Kenneth Fuller, is a 73 y.o. male  MRN: [id number]  DOB - 1945-12-21  Admit Date - 09/22/2018  Referring MD/NP/PA:  Kem Parkinson  Outpatient Primary MD for the patient is [name], [name], MD  Patient coming from:   home  Chief complaint- chest pain   HPI:    Kenneth Fuller  is a 73 y.o. male,  w hypertension, hyperlipidemia, Dm2, , COPD , OSA , CAD w L heart cath 12/02/2016-> 10% mid LAD, 10% mid RCA,  presents with c/o chest pain . Substernal  Tightness since Tuesday, no radiation. Pain intermittent but fairly persistent. Pt notes slight dysnea, in particular with exertion.  Pt denies fever, chills, cough, palp, n/v, abd pain, diarrhea, brbpr, black stool, dysuria, hematuria.   In ED,  T 97.9 P 65 R 15  Bp 181/85 pox 100% WT 127kg   CTA chest IMPRESSION: 1. No acute intrathoracic pathology. No CT evidence of pulmonary embolism. 2. Stable cardiomegaly. 3. Aortic Atherosclerosis (ICD10-I70.0).   BNP 176 Na 141, K 4.0, Bun 19, Creatinine 1.38 Glucose 110 Wbc 9.1, Hgb 10.4, Plt 248  Trop 6-> 6  covid - 19 negative  EKG ST at 105, nl axis, nl int, no st-t changes c/w ischemia  Pt will be admitted for w/up of chest pain   Reviewed cardiac catheterization 12/02/2016  Mid LAD lesion, 10 %stenosed.  Mid RCA lesion, 10 %stenosed.  The left ventricular systolic function is normal.  LV end diastolic pressure is mildly elevated.  The left ventricular ejection fraction is 55-65% by visual estimate.  There is no aortic valve stenosis.    Review of systems:    In addition to the HPI above,  No Fever-chills, No Headache, No changes with Vision or hearing, No problems swallowing food or Liquids, No Cough No Abdominal pain, No Nausea or Vomiting, bowel movements are regular, No Blood in stool or Urine, No dysuria, No new skin rashes or  bruises, No new joints pains-aches,  No new weakness, tingling, numbness in any extremity, No recent weight gain or loss, No polyuria, polydypsia or polyphagia, No significant Mental Stressors.  All other systems reviewed and are negative.    Past History of the following :    Past Medical History:  Diagnosis Date  . Allergy   . Asthma   . CAD (coronary artery disease)   . Diabetes mellitus   . Fibromyalgia   . GERD (gastroesophageal reflux disease)   . GI bleeding   . Gout   . Hyperlipidemia   . Hypertension   . Hypogonadism male   . Insomnia   . MRSA cellulitis   . Neuropathy   . Obesity   . Shortness of breath dyspnea    with exertion   . Sleep apnea    cpap- 14   . Wheezing    no asthma diagnosis      Past Surgical History:  Procedure Laterality Date  . BACK SURGERY    . CARDIAC CATHETERIZATION    .  COLONOSCOPY    . LEFT HEART CATH AND CORONARY ANGIOGRAPHY N/A 12/02/2016   Procedure: LEFT HEART CATH AND CORONARY ANGIOGRAPHY;  Surgeon: Jettie Booze, MD;  Location: [place] [facility];  Service: Cardiovascular;  Laterality: N/A;  . LUMBAR LAMINECTOMY/DECOMPRESSION MICRODISCECTOMY N/A 01/02/2014   Procedure: CENTRAL DECOMPRESSION LUMBAR LAMINECTOMY L3-L4, L4-L5;  Surgeon: Tobi Bastos, MD;  Location: [facility];  Service: Orthopedics;  Laterality: N/A;  . neck fusion        Social History:      Social History   Tobacco Use  . Smoking status: Former Smoker    Quit date: 02/08/1969    Years since quitting: 49.6  . Smokeless tobacco: Former Systems developer    Quit date: 1971  Substance Use Topics  . Alcohol use: No    Alcohol/week: 0.0 standard drinks       Family History :     Family History  Problem Relation Age of Onset  . Colon cancer Mother   . Diabetes Father        siblings  . Heart disease Father        brother  . Heart attack Father   . Kidney disease Sister   . Heart failure Sister   . Heart disease Brother   . Heart attack Son 36   . Drug abuse Sister   . Heart disease Brother   . Deep vein thrombosis Brother   . Colon polyps Neg Hx        Home Medications:   Prior to Admission medications   Medication Sig Start Date End Date Taking? Authorizing Provider  albuterol (PROVENTIL HFA;VENTOLIN HFA) 108 (90 Base) MCG/ACT inhaler Inhale 2 puffs into the lungs every 6 (six) hours as needed for wheezing or shortness of breath. 07/20/17   Brand Males, MD  atorvastatin (LIPITOR) 40 MG tablet Take 1 tablet by mouth once daily 08/03/18   [name], [name], MD  bisoprolol (ZEBETA) 5 MG tablet Take 0.5 tablets (2.5 mg total) by mouth daily. 01/13/18   Croitoru, Mihai, MD  Blood Glucose Monitoring Suppl (ACCU-CHEK AVIVA) device Use as instructed 04/18/18 04/18/19  [name], [name], MD  Blood Pressure Monitoring (BLOOD PRESSURE CUFF) MISC 1 each by Does not apply route daily. 05/22/18   [name], [name], MD  budesonide-formoterol (SYMBICORT) 160-4.5 MCG/ACT inhaler Inhale 2 puffs into the lungs 2 (two) times daily. 01/09/18   Barton Dubois, MD  buPROPion (WELLBUTRIN XL) 150 MG 24 hr tablet Take 1 tablet by mouth once daily 05/22/18   [name], [name], MD  Cholecalciferol (VITAMIN D PO) Take 5,000 Units by mouth every morning.     [provider]  clonazePAM (KLONOPIN) 0.5 MG tablet TAKE 1 TABLET BY MOUTH AT BEDTIME AS NEEDED 08/03/18   [name], [name], MD  furosemide (LASIX) 40 MG tablet Take 2 tablets (80 mg total) by mouth every morning. Take extra 40 mg tablet as directed. 02/13/18   Croitoru, Mihai, MD  glimepiride (AMARYL) 4 MG tablet TAKE 1 TABLET BY MOUTH WITH BREAKFAST 01/18/18   [name], [name], MD  glucose blood (ACCU-CHEK AVIVA) test strip 1 each by Other route daily. Use as instructed 04/18/18   [name], [name], MD  hydrALAZINE (APRESOLINE) 25 MG tablet Take 1 tablet (25 mg total) by mouth 3 (three) times daily. 02/03/18 02/03/19  Almyra Deforest, PA  isosorbide dinitrate (ISORDIL) 30 MG tablet Take 1  tablet (30 mg total) by mouth 3 (three) times daily. 01/13/18   Croitoru, [place],  MD  Lancets (ACCU-CHEK SOFT TOUCH) lancets 1 each by Other route daily. Use as instructed 04/18/18   [name], [name], MD  lisinopril (ZESTRIL) 20 MG tablet Take 1 tablet (20 mg total) by mouth daily. 08/18/18   [name], [name], MD  Misc. Devices (PULSE OXIMETER) MISC 1 each by Does not apply route continuous as needed. 07/26/18   [name], [name], MD  [facility] DELICA LANCETS 29H MISC 1 each by Does not apply route daily. Test 1X per day and prn 03/30/17   [name], [name], MD  OXYGEN Inhale 2 L into the lungs at bedtime. 2lpm with sleep     [provider]  Potassium Chloride ER 20 MEQ TBCR Take 1 tablet by mouth daily. 08/08/18   Almyra Deforest, PA  potassium chloride SA (K-DUR,KLOR-CON) 20 MEQ tablet Take 20 mEq by mouth daily.    [provider]  PRODIGY NO CODING BLOOD GLUC test strip CHECK BLOOD SUGARS ONCE DAILY 01/17/18   [name], [name], MD  sertraline (ZOLOFT) 50 MG tablet Take 1 tablet by mouth once daily 05/05/18   [name], [name], MD  sulfamethoxazole-trimethoprim (BACTRIM DS) 800-160 MG tablet Take 1 tablet by mouth 2 (two) times daily. 08/11/18   Hassell Done, Mary-Margaret, FNP  zolpidem (AMBIEN CR) 12.5 MG CR tablet TAKE 1 TABLET BY MOUTH AT BEDTIME AS NEEDED FOR SLEEP 08/17/18   [name], [name], MD     Allergies:     Allergies  Allergen Reactions  . Amlodipine Besy-Benazepril Hcl Swelling and Other (See Comments)    Makes tongue swell (lotrel)  . Oxycodone Itching  . Phenergan [Promethazine Hcl] Other (See Comments)    "I can't remember."  . Hydrocodone Itching    Can tolerate in low doses     Physical Exam:   Vitals  Blood pressure (!) 146/91, pulse (!) 59, temperature 97.9 F (36.6 C), temperature source Oral, resp. rate (!) 21, height _0  (1.702 m), weight 127 kg, SpO2 100 %.  1.  General: axoxo3  2. Psychiatric: euthymic  3. Neurologic: cn2-12 intact,  reflexes 2+ symmetric, diffuse with no clonus, motor 5/5 in all 4 ext  4. HEENMT:  Anicteric, pupils 1.71m symmetric, direct, consensual, near intact  5. Respiratory : CTAB  6. Cardiovascular : rrr s1, s2, no m/g/r  7. Gastrointestinal:  Abd: soft, nt, nd, +bs  8. Skin:  Ext: no c/c/e,  No rash  9.Musculoskeletal:  Good ROM    Data Review:    CBC Recent Labs  Lab 09/22/18 1730  WBC 9.1  HGB 10.4*  HCT 34.6*  PLT 248  MCV 82.0  MCH 24.6*  MCHC 30.1  RDW 16.3*   ------------------------------------------------------------------------------------------------------------------  Results for orders placed or performed during the hospital encounter of 09/22/18 (from the past 48 hour(s))  Basic metabolic panel     Status: Abnormal   Collection Time: 09/22/18  5:30 PM  Result Value Ref Range   Sodium 141 135 - 145 mmol/L   Potassium 4.0 3.5 - 5.1 mmol/L   Chloride 102 98 - 111 mmol/L   CO2 28 22 - 32 mmol/L   Glucose, Bld 110 (H) 70 - 99 mg/dL   BUN 19 8 - 23 mg/dL   Creatinine, Ser 1.38 (H) 0.61 - 1.24 mg/dL   Calcium 9.1 8.9 - 10.3 mg/dL   GFR calc non Af Amer 51 (L) >60 mL/min   GFR calc Af Amer 59 (L) >60 mL/min   Anion gap 11 5 - 15  Comment: Performed at [facility], [address], [place], [place] [address]  CBC     Status: Abnormal   Collection Time: 09/22/18  5:30 PM  Result Value Ref Range   WBC 9.1 4.0 - 10.5 K/uL   RBC 4.22 4.22 - 5.81 MIL/uL   Hemoglobin 10.4 (L) 13.0 - 17.0 g/dL   HCT 34.6 (L) 39.0 - 52.0 %   MCV 82.0 80.0 - 100.0 fL   MCH 24.6 (L) 26.0 - 34.0 pg   MCHC 30.1 30.0 - 36.0 g/dL   RDW 16.3 (H) 11.5 - 15.5 %   Platelets 248 150 - 400 K/uL   nRBC 0.0 0.0 - 0.2 %    Comment: Performed at [facility], [address], [place], [place] [address]  Troponin I (High Sensitivity)     Status: None   Collection Time: 09/22/18  5:30 PM  Result Value Ref Range   Troponin I (High Sensitivity) 6 <18 ng/L    Comment: (NOTE) Elevated high  sensitivity troponin I (hsTnI) values and significant  changes across serial measurements may suggest ACS but many other  chronic and acute conditions are known to elevate hsTnI results.  Refer to the "Links" section for chest pain algorithms and additional  guidance. Performed at [facility], [address] [place], [place] [address]   Brain natriuretic peptide     Status: Abnormal   Collection Time: 09/22/18  5:30 PM  Result Value Ref Range   B Natriuretic Peptide 176.0 (H) 0.0 - 100.0 pg/mL    Comment: Performed at [facility], [address], [place], [place] [address]  Troponin I (High Sensitivity)     Status: None   Collection Time: 09/22/18  7:37 PM  Result Value Ref Range   Troponin I (High Sensitivity) 6 <18 ng/L    Comment: (NOTE) Elevated high sensitivity troponin I (hsTnI) values and significant  changes across serial measurements may suggest ACS but many other  chronic and acute conditions are known to elevate hsTnI results.  Refer to the "Links" section for chest pain algorithms and additional  guidance. Performed at [facility], [address] [place], [place] [address]   SARS Coronavirus 2 [facility] order, Performed in [facility] hospital lab) Nasopharyngeal Nasopharyngeal Swab     Status: None   Collection Time: 09/22/18  8:59 PM   Specimen: Nasopharyngeal Swab  Result Value Ref Range   SARS Coronavirus 2 NEGATIVE NEGATIVE    Comment: (NOTE) If result is NEGATIVE SARS-CoV-2 target nucleic acids are NOT DETECTED. The SARS-CoV-2 RNA is generally detectable in upper and lower  respiratory specimens during the acute phase of infection. The lowest  concentration of SARS-CoV-2 viral copies this assay can detect is 250  copies / mL. A negative result does not preclude SARS-CoV-2 infection  and should not be used as the sole basis for treatment or other  patient management decisions.  A negative result may occur with  improper specimen collection / handling,  submission of specimen other  than nasopharyngeal swab, presence of viral mutation(s) within the  areas targeted by this assay, and inadequate number of viral copies  (<250 copies / mL). A negative result must be combined with clinical  observations, patient history, and epidemiological information. If result is POSITIVE SARS-CoV-2 target nucleic acids are DETECTED. The SARS-CoV-2 RNA is generally detectable in upper and lower  respiratory specimens dur ing the acute phase of infection.  Positive  results are indicative of active infection with SARS-CoV-2.  Clinical  correlation with patient history and other diagnostic information is  necessary to determine patient infection status.  Positive results do  not rule out bacterial infection or co-infection with other viruses. If result is PRESUMPTIVE POSTIVE SARS-CoV-2 nucleic acids MAY BE PRESENT.   A presumptive positive result was obtained on the submitted specimen  and confirmed on repeat testing.  While 2019 novel coronavirus  (SARS-CoV-2) nucleic acids may be present in the submitted sample  additional confirmatory testing may be necessary for epidemiological  and / or clinical management purposes  to differentiate between  SARS-CoV-2 and other Sarbecovirus currently known to infect humans.  If clinically indicated additional testing with an alternate test  methodology [phone]) is advised. The SARS-CoV-2 RNA is generally  detectable in upper and lower respiratory sp ecimens during the acute  phase of infection. The expected result is Negative. Fact Sheet for Patients:  StrictlyIdeas.no Fact Sheet for Healthcare Providers: BankingDealers.co.za This test is not yet approved or cleared by the [place] FDA and has been authorized for detection and/or diagnosis of SARS-CoV-2 by FDA under an Emergency Use Authorization (EUA).  This EUA will remain in effect (meaning this test can be  used) for the duration of the COVID-19 declaration under Section 564(b)(1) of the Act, 21 U.S.C. section 360bbb-3(b)(1), unless the authorization is terminated or revoked sooner. Performed at [facility], [address], [place], [place] [address]     Chemistries  Recent Labs  Lab 09/22/18 1730  NA 141  K 4.0  CL 102  CO2 28  GLUCOSE 110*  BUN 19  CREATININE 1.38*  CALCIUM 9.1   ------------------------------------------------------------------------------------------------------------------  ------------------------------------------------------------------------------------------------------------------ GFR: Estimated Creatinine Clearance: 61.9 mL/min (A) (by C-G formula based on SCr of 1.38 mg/dL (H)). Liver Function Tests: No results for input(s): AST, ALT, ALKPHOS, BILITOT, PROT, ALBUMIN in the last 168 hours. No results for input(s): LIPASE, AMYLASE in the last 168 hours. No results for input(s): AMMONIA in the last 168 hours. Coagulation Profile: No results for input(s): INR, PROTIME in the last 168 hours. Cardiac Enzymes: No results for input(s): CKTOTAL, CKMB, CKMBINDEX, TROPONINI in the last 168 hours. BNP (last 3 results) Recent Labs    01/16/18 0000  PROBNP 652*   HbA1C: No results for input(s): HGBA1C in the last 72 hours. CBG: No results for input(s): GLUCAP in the last 168 hours. Lipid Profile: No results for input(s): CHOL, HDL, LDLCALC, TRIG, CHOLHDL, LDLDIRECT in the last 72 hours. Thyroid Function Tests: No results for input(s): TSH, T4TOTAL, FREET4, T3FREE, THYROIDAB in the last 72 hours. Anemia Panel: No results for input(s): VITAMINB12, FOLATE, FERRITIN, TIBC, IRON, RETICCTPCT in the last 72 hours.  --------------------------------------------------------------------------------------------------------------- Urine analysis:    Component Value Date/Time   COLORURINE YELLOW 12/26/2015 2033   APPEARANCEUR Clear 08/11/2018 1439   LABSPEC  1.036 (H) 12/26/2015 2033   PHURINE 5.0 12/26/2015 2033   GLUCOSEU Negative 08/11/2018 1439   HGBUR NEGATIVE 12/26/2015 2033   BILIRUBINUR Negative 08/11/2018 1439   KETONESUR NEGATIVE 12/26/2015 2033   PROTEINUR Negative 08/11/2018 1439   PROTEINUR NEGATIVE 12/26/2015 2033   UROBILINOGEN 0.2 12/26/2013 1135   NITRITE Negative 08/11/2018 1439   NITRITE NEGATIVE 12/26/2015 2033   LEUKOCYTESUR 1+ (A) 08/11/2018 1439      Imaging Results:    Ct Angio Chest Pe W And/or Wo Contrast  Result Date: 09/23/2018 CLINICAL DATA:  73 year old male with COPD presenting with increased shortness of breath. EXAM: CT ANGIOGRAPHY CHEST WITH CONTRAST TECHNIQUE: Multidetector CT imaging of the chest was  performed using the standard protocol during bolus administration of intravenous contrast. Multiplanar CT image reconstructions and MIPs were obtained to evaluate the vascular anatomy. CONTRAST:  173m OMNIPAQUE IOHEXOL 350 MG/ML SOLN COMPARISON:  Chest CT dated 07/04/2017 FINDINGS: Cardiovascular: Stable cardiomegaly. No pericardial effusion. Mild atherosclerotic calcification of the thoracic aorta. There is no CT evidence of pulmonary embolism. Mediastinum/Nodes: No hilar or mediastinal adenopathy. The esophagus and the thyroid gland are grossly unremarkable. No mediastinal fluid collection. Lungs/Pleura: Minimal left lung base linear atelectasis. There is no focal consolidation, pleural effusion, or pneumothorax. The central airways are patent. Upper Abdomen: No acute abnormality. Musculoskeletal: Degenerative changes of the spine. No acute osseous pathology partially visualized lower cervical ACDF. Review of the MIP images confirms the above findings. IMPRESSION: 1. No acute intrathoracic pathology. No CT evidence of pulmonary embolism. 2. Stable cardiomegaly. 3. Aortic Atherosclerosis (ICD10-I70.0). Electronically Signed   By: AAnner CreteM.D.   On: 09/23/2018 00:08   Dg Chest Port 1 View  Result Date:  09/22/2018 CLINICAL DATA:  Increased short of breath EXAM: PORTABLE CHEST 1 VIEW COMPARISON:  01/04/2018 FINDINGS: Hardware in the cervical spine. Borderline cardiomegaly. Mild central congestion without acute acute focal opacity or pleural effusion. Aortic atherosclerosis. No pneumothorax. IMPRESSION: Borderline to mild cardiomegaly with slight central congestion Electronically Signed   By: KDonavan FoilM.D.   On: 09/22/2018 18:05       Assessment & Plan:    Principal Problem:   Chest pain Active Problems:   Diabetes mellitus type II, non insulin dependent (HCC)   Gout   Essential hypertension   Hyperlipidemia associated with type 2 diabetes mellitus (HCC)   Coronary artery disease involving native coronary artery of native heart without angina pectoris   Chronic diastolic heart failure (HCC)   CKD (chronic kidney disease), stage III (HCC)  Chest pain Tele Trop I q3h x3 Check cardiac echo  Check hga1c, lipid Aspirin 3240mpo qday Cont Lipitor 4074mo qhs Cont Zebeta 2.5mg29m qday Cont Isordil 30mg58mqday Cont Lisinopril 20mg 67mday  Chronic Diastolic CHF Cont Lasix 80mg p18DUay Cont Kcl 20 meq po qday Cont Hydralazine 25mg p37md Cont Isordil Cont Lisinopril Cont Zebeta  Dm2 Hold Amaryl fsbs q4h, ISS  Anxiety Cont Wellbutrin XL 150mg po84my Cont Zoloft 50mg po 71m Cont Klonopin 0.5mg po qd64mprn   Copd Cont Symbicort 2puff bid Cont Albuterol prn   DVT Prophylaxis-   Lovenox - SCDs   AM Labs Ordered, also please review Full Orders  Family Communication: Admission, patients condition and plan of care including tests being ordered have been discussed with the patient  who indicate understanding and agree with the plan and Code Status.  Code Status:  FULL CODE, spoke with wife regarding pt will be admitted to [facility]  [facility] Observation: Based on patients clinical presentation and evaluation of above clinical data, I have made determination that  patient meets observation criteria at this time.  Time spent in minutes : 70   Sayge Brienza Jani Gravel15/2020 at 1:14 AM

## 2018-09-23 NOTE — ED Notes (Signed)
Report given to [facility] with [place].

## 2018-09-23 NOTE — Plan of Care (Signed)
  Problem: Health Behavior/Discharge Planning: Goal: Ability to manage health-related needs will improve Outcome: Progressing

## 2018-09-23 NOTE — Progress Notes (Signed)
PROGRESS NOTE    Kenneth Fuller  UXL:[id number] DOB: [birth year] DOA: 09/22/2018 PCP: [name], [name], MD    Brief Narrative:  73 year old male who presented with chest pain.  He does have significant past medical history for hypertension, dyslipidemia, type II that is mellitus, COPD, coronary artery disease who presents with chest pain.  Reports upper sternal chest pressure for the last 3 days, persistent in nature, associated with dyspnea, symptoms worse with exertion.  On his initial physical examination his blood pressure 146/91, heart rate 59, temperature 97.9, respirate 21, his lungs are clear to auscultation bilaterally, heart S1-S2 present rhythm, abdomen soft, no lower extremity edema. Sodium 141, potassium 4.0, chloride 102, bicarb 28, glucose 110, BUN 19, creatinine 1.38, high-sensitivity troponin 6, 6, white count 9.1, hemoglobin 10.4, hematocrit 34.6, platelets 248.  SARS COVID-19 was negative. Chest film with no infiltrates, chest CT negative for PE. EKG 66 bpm, with normal axis, normal intervals, with no st segment or T wave changes.  Patient was admitted to the hospital with the working diagnosis of chest pain to rule out acute coronary syndrome.    Assessment & Plan:   Principal Problem:   Chest pain Active Problems:   Diabetes mellitus type II, non insulin dependent (HCC)   Gout   Essential hypertension   Hyperlipidemia associated with type 2 diabetes mellitus ([place])   Coronary artery disease involving native coronary artery of native heart without angina pectoris   Chronic diastolic heart failure (HCC)   CKD (chronic kidney disease), stage III ([place])   1. Acute on chronic diastolic heart failure exacerbation/ EF 45 to 50% (11/19). Patient with persistent dyspnea and lower extremity edema, at home on 80 mg of furosemide. Still hypervolemic today. Will increase furosemide to 60 mg IV q8 H and follow on strict in and out, target a negative fluid balance. Continue  blood pressure control and supplemental 02 per [place]. Follow on repeat echocardiogram. Continue telemetry monitoring, patient at high risk of clinical deterioration.   2. HTN. Blood pressure 143/78 mmHg, will hold on lisinopril for now, until patient more euvolemic, continue hydralazone and isosorbide.   3. CKD stage 3a. Base cr at 1,5 (old records personally reviewed), will continue aggressive diuresis with furosemide, follow with renal function in am.   4. T2DM. Will continue glucose cover and monitoring with insulin sliding scale, patient tolerating po well.   5. CAD. Chest pressure not angina type, high sensitive troponin negative, patient ruled out for a acute coronary syndrome.     DVT prophylaxis: heparin   Code Status: full Family Communication: no family at the bedside Disposition Plan/ discharge barriers: pending clinical improvement.   Body mass index is 44.26 kg/m. Malnutrition Type:      Malnutrition Characteristics:      Nutrition Interventions:     RN Pressure Injury Documentation:     Consultants:     Procedures:     Antimicrobials:       Subjective: Patient with with persistent dyspnea, associated with dry cough and occasional wheezing, positive lower extremity edema and pnd, at home on 80 mg of furosemide daily, and 2 lpm 02 per [place] at night.   Objective: Vitals:   09/23/18 0859 09/23/18 1021 09/23/18 1125 09/23/18 1300  BP: (!) 146/77 121/72  (!) 143/78  Pulse: (!) 55  (!) 58   Resp:   16   Temp: 97.8 F (36.6 C)     TempSrc:      SpO2: 100%  98%  Weight:      Height:       No intake or output data in the 24 hours ending 09/23/18 1344 Filed Weights   09/22/18 1714 09/23/18 0853  Weight: 127 kg 128.2 kg    Examination:   General: deconditioned and ill looking appearing Neurology: Awake and alert, non focal  E ENT: no pallor, no icterus, oral mucosa moist/ short neck Cardiovascular: No JVD. S1-S2 present, rhythmic, no gallops,  rubs, or murmurs. ++ pitting bilateral lower extremity edema. Pulmonary: positive breath sounds bilaterally, no wheezing, or rhonchi, scattered rales. Gastrointestinal. Abdomen with no organomegaly, non tender, no rebound or guarding Skin. No rashes Musculoskeletal: no joint deformities     Data Reviewed: I have personally reviewed following labs and imaging studies  CBC: Recent Labs  Lab 09/22/18 1730  WBC 9.1  HGB 10.4*  HCT 34.6*  MCV 82.0  PLT 735   Basic Metabolic Panel: Recent Labs  Lab 09/22/18 1730  NA 141  K 4.0  CL 102  CO2 28  GLUCOSE 110*  BUN 19  CREATININE 1.38*  CALCIUM 9.1   GFR: Estimated Creatinine Clearance: 62.2 mL/min (A) (by C-G formula based on SCr of 1.38 mg/dL (H)). Liver Function Tests: No results for input(s): AST, ALT, ALKPHOS, BILITOT, PROT, ALBUMIN in the last 168 hours. No results for input(s): LIPASE, AMYLASE in the last 168 hours. No results for input(s): AMMONIA in the last 168 hours. Coagulation Profile: No results for input(s): INR, PROTIME in the last 168 hours. Cardiac Enzymes: No results for input(s): CKTOTAL, CKMB, CKMBINDEX, TROPONINI in the last 168 hours. BNP (last 3 results) Recent Labs    01/16/18 0000  PROBNP 652*   HbA1C: No results for input(s): HGBA1C in the last 72 hours. CBG: Recent Labs  Lab 09/23/18 0353 09/23/18 0730 09/23/18 0857 09/23/18 1148  GLUCAP 87 88 84 98   Lipid Profile: No results for input(s): CHOL, HDL, LDLCALC, TRIG, CHOLHDL, LDLDIRECT in the last 72 hours. Thyroid Function Tests: No results for input(s): TSH, T4TOTAL, FREET4, T3FREE, THYROIDAB in the last 72 hours. Anemia Panel: No results for input(s): VITAMINB12, FOLATE, FERRITIN, TIBC, IRON, RETICCTPCT in the last 72 hours.    Radiology Studies: I have reviewed all of the imaging during this hospital visit personally     Scheduled Meds: . aspirin  325 mg Oral Daily  . atorvastatin  40 mg Oral Daily  . bisoprolol  2.5  mg Oral Daily  . buPROPion  150 mg Oral Daily  . cholecalciferol  5,000 Units Oral Daily  . enoxaparin (LOVENOX) injection  0.5 mg/kg Subcutaneous Q24H  . furosemide  80 mg Oral Daily  . hydrALAZINE  25 mg Oral TID  . insulin aspart  0-9 Units Subcutaneous Q4H  . isosorbide dinitrate  30 mg Oral TID  . lisinopril  20 mg Oral Daily  . mometasone-formoterol  2 puff Inhalation BID  . potassium chloride SA  20 mEq Oral Daily  . sertraline  50 mg Oral Daily   Continuous Infusions:   LOS: 0 days        Kenneth Fuller Gerome Apley, MD

## 2018-09-24 ENCOUNTER — Inpatient Hospital Stay (HOSPITAL_COMMUNITY): Payer: PPO

## 2018-09-24 DIAGNOSIS — I371 Nonrheumatic pulmonary valve insufficiency: Secondary | ICD-10-CM

## 2018-09-24 LAB — BASIC METABOLIC PANEL
Anion gap: 11 (ref 5–15)
BUN: 21 mg/dL (ref 8–23)
CO2: 30 mmol/L (ref 22–32)
Calcium: 8.6 mg/dL — ABNORMAL LOW (ref 8.9–10.3)
Chloride: 99 mmol/L (ref 98–111)
Creatinine, Ser: 1.3 mg/dL — ABNORMAL HIGH (ref 0.61–1.24)
GFR calc Af Amer: >60 mL/min (ref >60)
GFR calc non Af Amer: 55 mL/min — ABNORMAL LOW (ref >60)
Glucose, Bld: 123 mg/dL — ABNORMAL HIGH (ref 70–99)
Potassium: 3.6 mmol/L (ref 3.5–5.1)
Sodium: 140 mmol/L (ref 135–145)

## 2018-09-24 LAB — ECHOCARDIOGRAM COMPLETE
Height: 67 in
Weight: 4452.8 oz

## 2018-09-24 LAB — GLUCOSE, CAPILLARY
Glucose-Capillary: 112 mg/dL — ABNORMAL HIGH (ref 70–99)
Glucose-Capillary: 179 mg/dL — ABNORMAL HIGH (ref 70–99)
Glucose-Capillary: 140 mg/dL — ABNORMAL HIGH (ref 70–99)
Glucose-Capillary: 167 mg/dL — ABNORMAL HIGH (ref 70–99)

## 2018-09-24 MED ORDER — POTASSIUM CHLORIDE CRYS ER 20 MEQ PO TBCR
40.0000 meq | EXTENDED_RELEASE_TABLET | Freq: Once | ORAL | Status: AC
  Administered 2018-09-24: 15:00:00 40 meq via ORAL
  Filled 2018-09-24: qty 2

## 2018-09-24 MED ORDER — HYDROCHLOROTHIAZIDE 25 MG PO TABS
25.0000 mg | ORAL_TABLET | Freq: Every day | ORAL | Status: DC
  Administered 2018-09-24 – 2018-09-27 (×4): 25 mg via ORAL
  Filled 2018-09-24 (×4): qty 1

## 2018-09-24 MED ORDER — PERFLUTREN LIPID MICROSPHERE
INTRAVENOUS | Status: AC
  Administered 2018-09-24: 15:00:00 3 mL via INTRAVENOUS
  Filled 2018-09-24: qty 10

## 2018-09-24 MED ORDER — PERFLUTREN LIPID MICROSPHERE
1.0000 mL | INTRAVENOUS | Status: AC | PRN
  Filled 2018-09-24: qty 10

## 2018-09-24 MED ORDER — TORSEMIDE 20 MG PO TABS
40.0000 mg | ORAL_TABLET | Freq: Every day | ORAL | Status: DC
  Administered 2018-09-25: 10:00:00 40 mg via ORAL
  Filled 2018-09-24: qty 2

## 2018-09-24 MED ORDER — SALINE SPRAY 0.65 % NA SOLN
1.0000 | NASAL | Status: DC | PRN
  Filled 2018-09-24 (×2): qty 44

## 2018-09-24 MED ORDER — BISACODYL 5 MG PO TBEC
10.0000 mg | DELAYED_RELEASE_TABLET | Freq: Once | ORAL | Status: AC
  Administered 2018-09-24: 21:00:00 10 mg via ORAL
  Filled 2018-09-24: qty 2

## 2018-09-24 NOTE — Progress Notes (Signed)
  Echocardiogram 2D Echocardiogram has been performed.  Kenneth Fuller 09/24/2018, 3:33 PM

## 2018-09-24 NOTE — Progress Notes (Signed)
Patient states he is not ready for CPAP at this time and will place self on when ready. RT informed patient to call if he has any trouble.

## 2018-09-24 NOTE — Progress Notes (Addendum)
PROGRESS NOTE    Kenneth Fuller  QIH:[id number] DOB: 11-02-1945 DOA: 09/22/2018 PCP: [name], [name], MD    Brief Narrative:  73 year old male who presented with chest pain.  He does have significant past medical history for hypertension, dyslipidemia, type II that is mellitus, COPD, coronary artery disease who presents with chest pain.  Reports upper sternal chest pressure for the last 3 days, persistent in nature, associated with dyspnea, symptoms worse with exertion.  On his initial physical examination his blood pressure 146/91, heart rate 59, temperature 97.9, respirate 21, his lungs are clear to auscultation bilaterally, heart S1-S2 present rhythm, abdomen soft, no lower extremity edema. Sodium 141, potassium 4.0, chloride 102, bicarb 28, glucose 110, BUN 19, creatinine 1.38, high-sensitivity troponin 6, 6, white count 9.1, hemoglobin 10.4, hematocrit 34.6, platelets 248.  SARS COVID-19 was negative. Chest film with no infiltrates, chest CT negative for PE. EKG 66 bpm, with normal axis, normal intervals, with no st segment or T wave changes.  Patient was admitted to the hospital with the working diagnosis of chest pain to rule out acute coronary syndrome.    Assessment & Plan:   Principal Problem:   Chest pain Active Problems:   Diabetes mellitus type II, non insulin dependent (HCC)   Gout   Essential hypertension   Hyperlipidemia associated with type 2 diabetes mellitus (HCC)   Coronary artery disease involving native coronary artery of native heart without angina pectoris   Chronic diastolic heart failure (HCC)   CKD (chronic kidney disease), stage III (HCC)   Acute on chronic diastolic CHF (congestive heart failure) ([place])   1. Acute on chronic diastolic heart failure exacerbation/ EF 45 to 50% (11/19). Urine output over last 24 2,200 ml with improvement of his symptoms, will continue diuresis with oral torsemide to start in am, add hctz, to keep negative fluid balance.  Will need heart failure teaching. Follow with echocardiogram.   2. HTN.  Stable blood pressure, 163/84, will continue with bisoprolol, and isosorbide. Will add hctz in exchange from hydralazine for now.   3. CKD stage 3a with hypokalemia. Stable renal function with serum cr at 1,30 with K at 3,6 and serum bicarbonate at 30. Will follow renal panel in am, will continue diuresis with torsemide and hctz.  Continue K correction with Kcl.   4. T2DM. Glucose cover and monitoring with insulin sliding scale, patient tolerating po well. Fasting gflucose 123.   5. CAD. Ruled out for a acute coronary syndrome.    6. Chronic hypoxic respiratory failure due to COPD, complicated with core pulmonale. Contiune diuresis to keep negative fluid balance, will continue supplemental 02 per [place]. Out of bed tid to the chair and physical therapy. Continue dulera and as needed albuterol, no signs of COPD exacerbation.   7. Obesity. Calculated BMI is 43, will need life style modifications.   DVT prophylaxis: heparin   Code Status: full Family Communication: no family at the bedside Disposition Plan/ discharge barriers: pending clinical improvement.    Body mass index is 43.59 kg/m. Malnutrition Type:      Malnutrition Characteristics:      Nutrition Interventions:     RN Pressure Injury Documentation:     Consultants:     Procedures:     Antimicrobials:       Subjective: Patient is feeling better but not back to baseline, he has not been walking yet, dull chest pressure, no nausea or vomiting. At home uses supplemental 02 per [place]. At home not following  strict sodium restriction.   Objective: Vitals:   09/23/18 2137 09/23/18 2241 09/24/18 0633 09/24/18 0752  BP:   (!) 163/84   Pulse: 62 61 62 62  Resp:  18  15  Temp:   98.1 F (36.7 C)   TempSrc:   Oral   SpO2: 98%  95% 95%  Weight:   126.2 kg   Height:        Intake/Output Summary (Last 24 hours) at 09/24/2018 1309 Last  data filed at 09/24/2018 9233 Gross per 24 hour  Intake -  Output 2200 ml  Net -2200 ml   Filed Weights   09/22/18 1714 09/23/18 0853 09/24/18 0076  Weight: 127 kg 128.2 kg 126.2 kg    Examination:   General: deconditioned  Neurology: Awake and alert, non focal  E ENT: mild pallor, no icterus, oral mucosa moist Cardiovascular: No JVD. S1-S2 present, rhythmic, no gallops, rubs, or murmurs. + non pitting lower extremity edema. Pulmonary: positive breath sounds bilaterally, decreased breath sounds at bases, no wheezing, rhonchi or rales. Gastrointestinal. Abdomen protuberant with no organomegaly, non tender, no rebound or guarding Skin. No rashes Musculoskeletal: no joint deformities     Data Reviewed: I have personally reviewed following labs and imaging studies  CBC: Recent Labs  Lab 09/22/18 1730  WBC 9.1  HGB 10.4*  HCT 34.6*  MCV 82.0  PLT 226   Basic Metabolic Panel: Recent Labs  Lab 09/22/18 1730 09/24/18 0347  NA 141 140  K 4.0 3.6  CL 102 99  CO2 28 30  GLUCOSE 110* 123*  BUN 19 21  CREATININE 1.38* 1.30*  CALCIUM 9.1 8.6*   GFR: Estimated Creatinine Clearance: 65.5 mL/min (A) (by C-G formula based on SCr of 1.3 mg/dL (H)). Liver Function Tests: No results for input(s): AST, ALT, ALKPHOS, BILITOT, PROT, ALBUMIN in the last 168 hours. No results for input(s): LIPASE, AMYLASE in the last 168 hours. No results for input(s): AMMONIA in the last 168 hours. Coagulation Profile: No results for input(s): INR, PROTIME in the last 168 hours. Cardiac Enzymes: No results for input(s): CKTOTAL, CKMB, CKMBINDEX, TROPONINI in the last 168 hours. BNP (last 3 results) Recent Labs    01/16/18 0000  PROBNP 652*   HbA1C: No results for input(s): HGBA1C in the last 72 hours. CBG: Recent Labs  Lab 09/23/18 1148 09/23/18 1618 09/23/18 2134 09/24/18 0803 09/24/18 1128  GLUCAP 98 116* 129* 112* 179*   Lipid Profile: No results for input(s): CHOL, HDL,  LDLCALC, TRIG, CHOLHDL, LDLDIRECT in the last 72 hours. Thyroid Function Tests: No results for input(s): TSH, T4TOTAL, FREET4, T3FREE, THYROIDAB in the last 72 hours. Anemia Panel: No results for input(s): VITAMINB12, FOLATE, FERRITIN, TIBC, IRON, RETICCTPCT in the last 72 hours.    Radiology Studies: I have reviewed all of the imaging during this hospital visit personally     Scheduled Meds: . atorvastatin  40 mg Oral Daily  . bisoprolol  2.5 mg Oral Daily  . buPROPion  150 mg Oral Daily  . cholecalciferol  5,000 Units Oral Daily  . furosemide  60 mg Intravenous TID  . heparin injection (subcutaneous)  5,000 Units Subcutaneous Q8H  . hydrALAZINE  25 mg Oral TID  . insulin aspart  0-9 Units Subcutaneous Q4H  . isosorbide dinitrate  30 mg Oral TID  . mometasone-formoterol  2 puff Inhalation BID  . sertraline  50 mg Oral Daily   Continuous Infusions:   LOS: 1 day  Dijon Cosens Gerome Apley, MD

## 2018-09-25 LAB — BASIC METABOLIC PANEL
Anion gap: 10 (ref 5–15)
BUN: 21 mg/dL (ref 8–23)
CO2: 29 mmol/L (ref 22–32)
Calcium: 8.8 mg/dL — ABNORMAL LOW (ref 8.9–10.3)
Chloride: 99 mmol/L (ref 98–111)
Creatinine, Ser: 1.35 mg/dL — ABNORMAL HIGH (ref 0.61–1.24)
GFR calc Af Amer: >60 mL/min (ref >60)
GFR calc non Af Amer: 52 mL/min — ABNORMAL LOW (ref >60)
Glucose, Bld: 141 mg/dL — ABNORMAL HIGH (ref 70–99)
Potassium: 3.8 mmol/L (ref 3.5–5.1)
Sodium: 138 mmol/L (ref 135–145)

## 2018-09-25 LAB — GLUCOSE, CAPILLARY
Glucose-Capillary: 125 mg/dL — ABNORMAL HIGH (ref 70–99)
Glucose-Capillary: 128 mg/dL — ABNORMAL HIGH (ref 70–99)
Glucose-Capillary: 184 mg/dL — ABNORMAL HIGH (ref 70–99)
Glucose-Capillary: 95 mg/dL (ref 70–99)
Glucose-Capillary: 143 mg/dL — ABNORMAL HIGH (ref 70–99)

## 2018-09-25 MED ORDER — TORSEMIDE 20 MG PO TABS
40.0000 mg | ORAL_TABLET | Freq: Two times a day (BID) | ORAL | Status: DC
  Administered 2018-09-25 – 2018-09-26 (×2): 40 mg via ORAL
  Filled 2018-09-25 (×2): qty 2

## 2018-09-25 NOTE — Progress Notes (Signed)
Pt has CPAP at beside within reach and states ge will place himself on it when he is ready for bed.

## 2018-09-25 NOTE — Progress Notes (Signed)
PROGRESS NOTE    Kenneth Fuller  ASN:[id number] DOB: 11-06-1945 DOA: 09/22/2018 PCP: [name], [name], MD    Brief Narrative:  73 year old male who presented with chest pain. He does have significant past medical history for hypertension, dyslipidemia, type II that is mellitus, COPD, coronary artery disease who presents with chest pain. Reports upper sternal chest pressure for the last 3 days, persistent in nature, associated with dyspnea, symptoms worse with exertion. On his initial physical examination his blood pressure 146/91, heart rate 59, temperature 97.9, respirate 21, his lungs are clear to auscultation bilaterally, heart S1-S2 present rhythm, abdomen soft, no lower extremity edema. Sodium 141, potassium 4.0, chloride 102, bicarb 28, glucose 110, BUN 19, creatinine 1.38, high-sensitivity troponin6, 6, white count 9.1, hemoglobin 10.4, hematocrit 34.6, platelets 248.SARS COVID-19 was negative. Chest film with no infiltrates, chest CT negative for PE. EKG 66 bpm, with normal axis, normal intervals, with no st segment or T wave changes.  Patient was admitted to the hospital with the working diagnosis of chest pain to rule out acute coronary syndrome, in the setting of acute decompensation of chronic heart failure.    Assessment & Plan:   Principal Problem:   Chest pain Active Problems:   Diabetes mellitus type II, non insulin dependent (HCC)   Gout   Essential hypertension   Hyperlipidemia associated with type 2 diabetes mellitus (HCC)   Coronary artery disease involving native coronary artery of native heart without angina pectoris   Chronic diastolic heart failure (HCC)   CKD (chronic kidney disease), stage III (HCC)   Acute on chronic diastolic CHF (congestive heart failure) ([place])     1. Acute on chronic diastolic heart failure exacerbation/ EF 45 to 50% (11/19). Urine output over last 24 is 1,450 ml, but continue to have lower extremity edema and dyspnea, will  continue diuresis with torsemide 40 mg bid and hctz, follow on urine out put and symptoms. Follow up echocardiogram with preserved LV systolic dysfunction.   2. HTN. Continue  Blood pressure control with isosorbide, bisoprolol, continue to hold on lisinopril for now.  3. CKD stage 3a with hypokalemia. Stable renal function with serum cr at 1,35 with K at 3,8 and serum bicarbonate at 29, will follow on renal panel in am.  4. T2DM. Glucose this am at 141, will continue sliding scale for glucose cover and monitoring.    5. CAD. Stable with no angina. Continue with atorvastatin.   6. Chronic hypoxic respiratory failure due to COPD, complicated with core pulmonale. No signs of acute COPD exacerbation, will continue with needed albuterol. Suspected pulmonary HTN due to OSA and  COPD.    7. Obesity. BMI is 43, follow as outpatient.   DVT prophylaxis:heparin Code Status:full Family Communication:no family at the bedside Disposition Plan/ discharge barriers:pending clinical improvement, possible dc in am.   Body mass index is 43.96 kg/m. Malnutrition Type:      Malnutrition Characteristics:      Nutrition Interventions:     RN Pressure Injury Documentation:     Consultants:     Procedures:     Antimicrobials:       Subjective: Patient with improved dyspnea but not yet back to baseline, continue to have lower extremity edema, able to ambulate and get out of bed, no nausea or vomiting.   Objective: Vitals:   09/24/18 1953 09/24/18 2103 09/25/18 0400 09/25/18 0722  BP:  (!) 170/83 (!) 155/82   Pulse:  65    Resp:  Temp:  98.4 F (36.9 C) 97.7 F (36.5 C)   TempSrc:  Oral Oral   SpO2: 96% 94% 96% 96%  Weight:   127.3 kg   Height:        Intake/Output Summary (Last 24 hours) at 09/25/2018 1057 Last data filed at 09/25/2018 0400 Gross per 24 hour  Intake 720 ml  Output 1450 ml  Net -730 ml   Filed Weights   09/23/18 0853 09/24/18 0633  09/25/18 0400  Weight: 128.2 kg 126.2 kg 127.3 kg    Examination:   General: Not in pain or dyspnea, deconditioned  Neurology: Awake and alert, non focal  E ENT: mild pallor, no icterus, oral mucosa moist Cardiovascular: No JVD. S1-S2 present, rhythmic, no gallops, rubs, or murmurs. ++ pitting bilateral lower extremity edema. Pulmonary: positive breath sounds bilaterally,  poor air movement, no wheezing, rhonchi or rales. Gastrointestinal. Abdomen protuberant with no organomegaly, non tender, no rebound or guarding Skin. No rashes Musculoskeletal: no joint deformities     Data Reviewed: I have personally reviewed following labs and imaging studies  CBC: Recent Labs  Lab 09/22/18 1730  WBC 9.1  HGB 10.4*  HCT 34.6*  MCV 82.0  PLT 295   Basic Metabolic Panel: Recent Labs  Lab 09/22/18 1730 09/24/18 0347 09/25/18 0643  NA 141 140 138  K 4.0 3.6 3.8  CL 102 99 99  CO2 _0 GLUCOSE 110* 123* 141*  BUN _1 CREATININE 1.38* 1.30* 1.35*  CALCIUM 9.1 8.6* 8.8*   GFR: Estimated Creatinine Clearance: 63.4 mL/min (A) (by C-G formula based on SCr of 1.35 mg/dL (H)). Liver Function Tests: No results for input(s): AST, ALT, ALKPHOS, BILITOT, PROT, ALBUMIN in the last 168 hours. No results for input(s): LIPASE, AMYLASE in the last 168 hours. No results for input(s): AMMONIA in the last 168 hours. Coagulation Profile: No results for input(s): INR, PROTIME in the last 168 hours. Cardiac Enzymes: No results for input(s): CKTOTAL, CKMB, CKMBINDEX, TROPONINI in the last 168 hours. BNP (last 3 results) Recent Labs    01/16/18 0000  PROBNP 652*   HbA1C: No results for input(s): HGBA1C in the last 72 hours. CBG: Recent Labs  Lab 09/24/18 1128 09/24/18 1657 09/24/18 2102 09/25/18 0453 09/25/18 0744  GLUCAP 179* 140* 167* 125* 128*   Lipid Profile: No results for input(s): CHOL, HDL, LDLCALC, TRIG, CHOLHDL, LDLDIRECT in the last 72 hours. Thyroid Function  Tests: No results for input(s): TSH, T4TOTAL, FREET4, T3FREE, THYROIDAB in the last 72 hours. Anemia Panel: No results for input(s): VITAMINB12, FOLATE, FERRITIN, TIBC, IRON, RETICCTPCT in the last 72 hours.    Radiology Studies: I have reviewed all of the imaging during this hospital visit personally     Scheduled Meds: . atorvastatin  40 mg Oral Daily  . bisoprolol  2.5 mg Oral Daily  . buPROPion  150 mg Oral Daily  . cholecalciferol  5,000 Units Oral Daily  . heparin injection (subcutaneous)  5,000 Units Subcutaneous Q8H  . hydrochlorothiazide  25 mg Oral Daily  . insulin aspart  0-9 Units Subcutaneous Q4H  . isosorbide dinitrate  30 mg Oral TID  . mometasone-formoterol  2 puff Inhalation BID  . sertraline  50 mg Oral Daily  . torsemide  40 mg Oral Daily   Continuous Infusions:   LOS: 2 days        Auda Finfrock Gerome Apley, MD

## 2018-09-25 NOTE — Evaluation (Signed)
Physical Therapy Evaluation Patient Details Name: Kenneth Fuller MRN: [id number] DOB: 01-23-1946 Today's Date: 09/25/2018   History of Present Illness  73 y.o. male,  w hypertension, hyperlipidemia, Dm2, , COPD , OSA , CAD w L heart cath 12/02/2016-> 10% mid LAD, 10% mid RCA,  presents with c/o chest pain. Admitted 09/23/18 for treatment of Acute on chronic diastolic heart failure exacerbation/ EF 45 to 50%  Clinical Impression  PTA pt living with wife and son in multi-floor home with bed and bath on main level and ramped entrance. Pt is independent with mobility and iADLs. Pt is mod I for transfers and ambulation. Pt has no PT or equipment needs at this time. PT is signing off.     Follow Up Recommendations No PT follow up    Equipment Recommendations  None recommended by PT       Precautions / Restrictions Precautions Precautions: None Restrictions Weight Bearing Restrictions: No      Mobility  Bed Mobility               General bed mobility comments: OOB in bathroom on entry  Transfers Overall transfer level: Modified independent Equipment used: (handrail in bathroom)             General transfer comment: good power up and steadying with use of handrail in bathroom  Ambulation/Gait Ambulation/Gait assistance: Modified independent (Device/Increase time) Gait Distance (Feet): 450 Feet Assistive device: None Gait Pattern/deviations: WFL(Within Functional Limits);Decreased stride length Gait velocity: WFL Gait velocity interpretation: <1.31 ft/sec, indicative of household ambulator General Gait Details: slow, steady pace with 2/4 DOE        Balance Overall balance assessment: Mild deficits observed, not formally tested                                           Pertinent Vitals/Pain Pain Assessment: No/denies pain    Home Living Family/patient expects to be discharged to:: Private residence Living Arrangements: Spouse/significant  other Available Help at Discharge: Family;Available 24 hours/day Type of Home: House Home Access: Ramped entrance     Home Layout: Multi-level;Laundry or work area in basement;Able to live on main level with bedroom/bathroom Home Equipment: Environmental consultant - 2 wheels;Grab bars - tub/shower;Shower seat - built in      Prior Function Level of Independence: Independent                  Extremity/Trunk Assessment   Upper Extremity Assessment Upper Extremity Assessment: Overall WFL for tasks assessed    Lower Extremity Assessment Lower Extremity Assessment: Overall WFL for tasks assessed       Communication   Communication: No difficulties  Cognition Arousal/Alertness: Awake/alert Behavior During Therapy: WFL for tasks assessed/performed Overall Cognitive Status: Within Functional Limits for tasks assessed                                        General Comments General comments (skin integrity, edema, etc.):  2/4 DoE with ambulation of 450 ft SaO2 on RA 96%O2     Assessment/Plan    PT Assessment Patent does not need any further PT services         PT Goals (Current goals can be found in the Care Plan section)  Acute Rehab PT Goals Patient Stated Goal: go  home today PT Goal Formulation: With patient     AM-PAC PT "6 Clicks" Mobility  Outcome Measure Help needed turning from your back to your side while in a flat bed without using bedrails?: None Help needed moving from lying on your back to sitting on the side of a flat bed without using bedrails?: None Help needed moving to and from a bed to a chair (including a wheelchair)?: None Help needed standing up from a chair using your arms (e.g., wheelchair or bedside chair)?: None Help needed to walk in hospital room?: None Help needed climbing 3-5 steps with a railing? : None 6 Click Score: 24    End of Session   Activity Tolerance: Patient tolerated treatment well Patient left: in bed;with call  bell/phone within reach Nurse Communication: Mobility status PT Visit Diagnosis: Muscle weakness (generalized) (M62.81)    Time: 4081-4481 PT Time Calculation (min) (ACUTE ONLY): 17 min   Charges:   PT Evaluation $PT Eval Moderate Complexity: 1 Mod          Loni Abdon B. Migdalia Dk PT, DPT Acute Rehabilitation Services Pager [phone] Office [phone]   [place] 09/25/2018, 9:31 AM

## 2018-09-26 LAB — GLUCOSE, CAPILLARY
Glucose-Capillary: 202 mg/dL — ABNORMAL HIGH (ref 70–99)
Glucose-Capillary: 142 mg/dL — ABNORMAL HIGH (ref 70–99)
Glucose-Capillary: 111 mg/dL — ABNORMAL HIGH (ref 70–99)
Glucose-Capillary: 129 mg/dL — ABNORMAL HIGH (ref 70–99)

## 2018-09-26 LAB — BASIC METABOLIC PANEL
Anion gap: 13 (ref 5–15)
BUN: 25 mg/dL — ABNORMAL HIGH (ref 8–23)
CO2: 30 mmol/L (ref 22–32)
Calcium: 8.9 mg/dL (ref 8.9–10.3)
Chloride: 95 mmol/L — ABNORMAL LOW (ref 98–111)
Creatinine, Ser: 1.49 mg/dL — ABNORMAL HIGH (ref 0.61–1.24)
GFR calc Af Amer: 54 mL/min — ABNORMAL LOW (ref >60)
GFR calc non Af Amer: 46 mL/min — ABNORMAL LOW (ref >60)
Glucose, Bld: 114 mg/dL — ABNORMAL HIGH (ref 70–99)
Potassium: 3.3 mmol/L — ABNORMAL LOW (ref 3.5–5.1)
Sodium: 138 mmol/L (ref 135–145)

## 2018-09-26 MED ORDER — TORSEMIDE 20 MG PO TABS: 40.0000 mg | ORAL_TABLET | Freq: Two times a day (BID) | ORAL | Status: DC

## 2018-09-26 MED ORDER — POTASSIUM CHLORIDE CRYS ER 20 MEQ PO TBCR
40.0000 meq | EXTENDED_RELEASE_TABLET | Freq: Once | ORAL | Status: AC
  Administered 2018-09-26: 40 meq via ORAL
  Filled 2018-09-26: qty 2

## 2018-09-26 NOTE — Care Management Important Message (Signed)
Important Message  Patient Details  Name: BENTLEY FISSEL MRN: [id number] Date of Birth: 02/16/1945   Medicare Important Message Given:  Yes     Shelda Altes 09/26/2018, 12:42 PM

## 2018-09-26 NOTE — Progress Notes (Signed)
Pt self administers CPAP.  CPAP H20 chamber has been filled with H20 and is ready for patient use.

## 2018-09-26 NOTE — Progress Notes (Addendum)
PROGRESS NOTE    Kenneth Fuller  BMW:[id number] DOB: 04-Oct-1945 DOA: 09/22/2018 PCP: [name], [name], MD    Brief Narrative:  73 year old male who presented with chest pain. He does have significant past medical history for hypertension, dyslipidemia, type II that is mellitus, COPD, coronary artery disease who presents with chest pain. Reports upper sternal chest pressure for the last 3 days, persistent in nature, associated with dyspnea, symptoms worse with exertion. On his initial physical examination his blood pressure 146/91, heart rate 59, temperature 97.9, respirate 21, his lungs are clear to auscultation bilaterally, heart S1-S2 present rhythm, abdomen soft, no lower extremity edema. Sodium 141, potassium 4.0, chloride 102, bicarb 28, glucose 110, BUN 19, creatinine 1.38, high-sensitivity troponin6, 6, white count 9.1, hemoglobin 10.4, hematocrit 34.6, platelets 248.SARS COVID-19 was negative. Chest film with no infiltrates, chest CT negative for PE. EKG 66 bpm, with normal axis, normal intervals, with no st segment or T wave changes.  Patient was admitted to the hospital with the working diagnosis of chest pain to rule out acute coronary syndrome, in the setting of acute decompensation of chronic heart failure.    Assessment & Plan:   Principal Problem:   Chest pain Active Problems:   Diabetes mellitus type II, non insulin dependent (HCC)   Gout   Essential hypertension   Hyperlipidemia associated with type 2 diabetes mellitus (HCC)   Coronary artery disease involving native coronary artery of native heart without angina pectoris   Chronic diastolic heart failure (HCC)   CKD (chronic kidney disease), stage III (HCC)   Acute on chronic diastolic CHF (congestive heart failure) ([place])    1. Acute on chronic diastolic heart failure exacerbation/ EF 50 to 55% with moderately dilated cavity with mild LVH. Urine output over last 24 is 3,500 ml, since admission is 5,790 ml  negative fluid balance with significant improvement in his symptoms, will continue torsemide 40 mg daily and follow up renal function in am. Patient with preserved LV function heart failure will dc after load reducing agents  hydralazine and isosorbide. Plan to dc patient on once daily torsemide with instruction to take extra dose in pm if signs of volume overlaod. Heart failure teaching before discharge.   2. HTN. Continue bisoprolol, and hctz for blood pressure control will  continue to hold on lisinopril due to low gfr per worsening serum creatine.  3. CKD stage 3awith hypokalemia.Worsening renal function with serum ct this am at 1,34, will change torsemide to 40 mg daily and will follow on renal panel in am. Continue K correction with Kcl.   4. T2DM.Glucose this am at 114. On insulin sliding scale for glucose cover and monitoring.   5. CAD.On atorvastatin.   6. Chronic hypoxic respiratory failure due to COPD, complicated with core pulmonale/ Suspected pulmonary HTN due to OSA and  COPD.[name]  No COPD exacerbation, on as needed albuterol. Continue supplemental 02 per [place].     7. Obesity. BMI is 43, needs lifestyle modifications.    8. Depression. No confusion or agitation, will continue with sertraline, clonazepam and bupropion.   DVT prophylaxis:heparin Code Status:full Family Communication:no family at the bedside Disposition Plan/ discharge barriers:pending clinical improvement, possible dc in am.   Body mass index is 42.77 kg/m. Malnutrition Type:      Malnutrition Characteristics:      Nutrition Interventions:     RN Pressure Injury Documentation:     Consultants:     Procedures:     Antimicrobials:  Subjective: Patient is feeling dizzy and light headed, no nausea or vomiting, dyspnea and lower extremity edema continue to improve, no nausea or vomiting, No further chest pain.   Objective: Vitals:   09/25/18 2008 09/25/18 2015  09/26/18 0550 09/26/18 0713  BP:  (!) 173/89 (!) 166/93   Pulse:  61 65   Resp:      Temp:  98.4 F (36.9 C) 98.2 F (36.8 C)   TempSrc:  Oral Oral   SpO2: 96% 93% 93% 96%  Weight:   123.9 kg   Height:        Intake/Output Summary (Last 24 hours) at 09/26/2018 1018 Last data filed at 09/26/2018 0717 Gross per 24 hour  Intake 920 ml  Output 4500 ml  Net -3580 ml   Filed Weights   09/24/18 0633 09/25/18 0400 09/26/18 0550  Weight: 126.2 kg 127.3 kg 123.9 kg    Examination:   General: Not in pain or dyspnea, deconditioned  Neurology: Awake and alert, non focal  E ENT: mild pallor, no icterus, oral mucosa moist Cardiovascular: No JVD. S1-S2 present, rhythmic, no gallops, rubs, or murmurs. + pitting lower extremity edema. Pulmonary: positive breath sounds bilaterally, adequate air movement, no wheezing, rhonchi or rales. Gastrointestinal. Abdomen protuberant with no organomegaly, non tender, no rebound or guarding Skin. No rashes Musculoskeletal: no joint deformities     Data Reviewed: I have personally reviewed following labs and imaging studies  CBC: Recent Labs  Lab 09/22/18 1730  WBC 9.1  HGB 10.4*  HCT 34.6*  MCV 82.0  PLT 574   Basic Metabolic Panel: Recent Labs  Lab 09/22/18 1730 09/24/18 0347 09/25/18 0643 09/26/18 0322  NA 141 140 138 138  K 4.0 3.6 3.8 3.3*  CL 102 99 99 95*  CO2 _0 GLUCOSE 110* 123* 141* 114*  BUN _1 25*  CREATININE 1.38* 1.30* 1.35* 1.49*  CALCIUM 9.1 8.6* 8.8* 8.9   GFR: Estimated Creatinine Clearance: 56.5 mL/min (A) (by C-G formula based on SCr of 1.49 mg/dL (H)). Liver Function Tests: No results for input(s): AST, ALT, ALKPHOS, BILITOT, PROT, ALBUMIN in the last 168 hours. No results for input(s): LIPASE, AMYLASE in the last 168 hours. No results for input(s): AMMONIA in the last 168 hours. Coagulation Profile: No results for input(s): INR, PROTIME in the last 168 hours. Cardiac Enzymes: No results  for input(s): CKTOTAL, CKMB, CKMBINDEX, TROPONINI in the last 168 hours. BNP (last 3 results) Recent Labs    01/16/18 0000  PROBNP 652*   HbA1C: No results for input(s): HGBA1C in the last 72 hours. CBG: Recent Labs  Lab 09/25/18 0744 09/25/18 1105 09/25/18 1656 09/25/18 2118 09/26/18 0817  GLUCAP 128* 184* 95 143* 202*   Lipid Profile: No results for input(s): CHOL, HDL, LDLCALC, TRIG, CHOLHDL, LDLDIRECT in the last 72 hours. Thyroid Function Tests: No results for input(s): TSH, T4TOTAL, FREET4, T3FREE, THYROIDAB in the last 72 hours. Anemia Panel: No results for input(s): VITAMINB12, FOLATE, FERRITIN, TIBC, IRON, RETICCTPCT in the last 72 hours.    Radiology Studies: I have reviewed all of the imaging during this hospital visit personally     Scheduled Meds: . atorvastatin  40 mg Oral Daily  . bisoprolol  2.5 mg Oral Daily  . buPROPion  150 mg Oral Daily  . cholecalciferol  5,000 Units Oral Daily  . heparin injection (subcutaneous)  5,000 Units Subcutaneous Q8H  . hydrochlorothiazide  25 mg Oral Daily  . insulin aspart  0-9 Units Subcutaneous Q4H  . isosorbide dinitrate  30 mg Oral TID  . mometasone-formoterol  2 puff Inhalation BID  . sertraline  50 mg Oral Daily  . torsemide  40 mg Oral BID   Continuous Infusions:   LOS: 3 days        Nioma Mccubbins Gerome Apley, MD

## 2018-09-27 DIAGNOSIS — I10 Essential (primary) hypertension: Secondary | ICD-10-CM

## 2018-09-27 LAB — BASIC METABOLIC PANEL
Anion gap: 14 (ref 5–15)
BUN: 38 mg/dL — ABNORMAL HIGH (ref 8–23)
CO2: 28 mmol/L (ref 22–32)
Calcium: 9.3 mg/dL (ref 8.9–10.3)
Chloride: 94 mmol/L — ABNORMAL LOW (ref 98–111)
Creatinine, Ser: 1.79 mg/dL — ABNORMAL HIGH (ref 0.61–1.24)
GFR calc Af Amer: 43 mL/min — ABNORMAL LOW (ref >60)
GFR calc non Af Amer: 37 mL/min — ABNORMAL LOW (ref >60)
Glucose, Bld: 110 mg/dL — ABNORMAL HIGH (ref 70–99)
Potassium: 3.3 mmol/L — ABNORMAL LOW (ref 3.5–5.1)
Sodium: 136 mmol/L (ref 135–145)

## 2018-09-27 LAB — GLUCOSE, CAPILLARY
Glucose-Capillary: 131 mg/dL — ABNORMAL HIGH (ref 70–99)
Glucose-Capillary: 108 mg/dL — ABNORMAL HIGH (ref 70–99)
Glucose-Capillary: 158 mg/dL — ABNORMAL HIGH (ref 70–99)

## 2018-09-27 MED ORDER — POTASSIUM CHLORIDE CRYS ER 20 MEQ PO TBCR
40.0000 meq | EXTENDED_RELEASE_TABLET | Freq: Once | ORAL | Status: AC
  Administered 2018-09-27: 09:00:00 40 meq via ORAL
  Filled 2018-09-27: qty 2

## 2018-09-27 MED ORDER — HYDRALAZINE HCL 25 MG PO TABS
25.0000 mg | ORAL_TABLET | Freq: Three times a day (TID) | ORAL | Status: DC
  Administered 2018-09-27 – 2018-09-28 (×3): 25 mg via ORAL
  Filled 2018-09-27 (×3): qty 1

## 2018-09-27 NOTE — Progress Notes (Signed)
Pt states he is having mild back pain feels like his kidneys are sore. Pt denies difficulty or pain with urination. Pt given Tylenol and hot packs. Will continue to monitor. Jessie Foot, RN

## 2018-09-27 NOTE — Plan of Care (Signed)
  Problem: Clinical Measurements: Goal: Will remain free from infection Outcome: Completed/Met Goal: Cardiovascular complication will be avoided Outcome: Completed/Met   Problem: Nutrition: Goal: Adequate nutrition will be maintained Outcome: Completed/Met   Problem: Elimination: Goal: Will not experience complications related to bowel motility Outcome: Completed/Met Goal: Will not experience complications related to urinary retention Outcome: Completed/Met   Problem: Safety: Goal: Ability to remain free from injury will improve Outcome: Completed/Met   Problem: Skin Integrity: Goal: Risk for impaired skin integrity will decrease Outcome: Completed/Met

## 2018-09-27 NOTE — Progress Notes (Signed)
PROGRESS NOTE    Kenneth Fuller  WEX:[id number] DOB: [birth year] DOA: 09/22/2018 PCP: [name], [name], MD    Brief Narrative:  73 year old male who presented with chest pain. He does have significant past medical history for hypertension, dyslipidemia, type II that is mellitus, COPD, coronary artery disease who presents with chest pain. Reports upper sternal chest pressure for the last 3 days, persistent in nature, associated with dyspnea, symptoms worse with exertion. Chest film with no infiltrates, chest CT negative for PE. EKG 66 bpm, with normal axis, normal intervals, with no st segment or T wave changes. Patient was admitted to the hospital with the working diagnosis of chest pain to rule out acute coronary syndrome, in the setting of acute decompensation of chronic heart failure.    Assessment & Plan:   Principal Problem:   Chest pain Active Problems:   Diabetes mellitus type II, non insulin dependent (HCC)   Gout   Essential hypertension   Hyperlipidemia associated with type 2 diabetes mellitus (HCC)   Coronary artery disease involving native coronary artery of native heart without angina pectoris   Chronic diastolic heart failure (HCC)   CKD (chronic kidney disease), stage III (HCC)   Acute on chronic diastolic CHF (congestive heart failure) (HCC)    Acute on chronic diastolic heart failure exacerbation/ EF 50 to 55% with moderately dilated cavity with mild LVH Improving, with adequate diuresis, about 10,310 mils negative fluid balance Held torsemide twice daily 40 mg due to worsening renal function Strict I's and O's, daily weights BMP in the a.m.  HTN Continue bisoprolol, hydralazine Discontinued Hctz due to CKD, continue to hold home lisinopril Monitor closely  Mild AKI on CKD stage 3 with hypokalemia Baseline creatinine around 1.5 Spoke to nephrology, recommended holding torsemide and rechecking BMP Held torsemide, held home Lasix for now Daily BMP   Diabetes mellitus type 2 SSI, Accu-Cheks, hypoglycemic protocol  CAD Currently chest pain-free Continue Lipitor  Chronic hypoxic respiratory failure due to COPD, complicated with core pulmonale/ Suspected pulmonary HTN due to OSA and  COPD Stable Continue supplemental oxygen PRN   Morbid obesity BMI 42.66 Lifestyle modification advised   Depression Continue home sertraline, clonazepam and bupropion.     DVT prophylaxis:heparin Code Status:full Family Communication:no family at the bedside Disposition Plan/ discharge barriers:pending clinical improvement   Body mass index is 42.66 kg/m. Malnutrition Type:      Malnutrition Characteristics:      Nutrition Interventions:     RN Pressure Injury Documentation:     Consultants:   Spoke to nephrology on 09/27/2018  Procedures:   None  Antimicrobials:   None   Subjective: Patient reports feeling dizzy when out of bed.  Patient denies any worsening shortness of breath, denies any chest pain, abdominal pain, nausea/vomiting, fever/chills.  Objective: Vitals:   09/26/18 2200 09/27/18 0540 09/27/18 0811 09/27/18 1335  BP: (!) 174/93 (!) 173/93  (!) 155/88  Pulse: 61 (!) 56 (!) 59 (!) 58  Resp:   16 18  Temp: 98.1 F (36.7 C) 97.6 F (36.4 C)  98.1 F (36.7 C)  TempSrc: Oral Oral  Oral  SpO2: 95% 97% 98% 96%  Weight:  123.6 kg    Height:        Intake/Output Summary (Last 24 hours) at 09/27/2018 1658 Last data filed at 09/27/2018 0542 Gross per 24 hour  Intake 480 ml  Output 5000 ml  Net -4520 ml   Filed Weights   09/25/18 0400 09/26/18 0550 09/27/18 0540  Weight: 127.3 kg 123.9 kg 123.6 kg    Examination:   General: Not in pain or dyspnea, deconditioned  Neurology: Awake and alert, non focal  E ENT: mild pallor, no icterus, oral mucosa moist Cardiovascular: No JVD. S1-S2 present, rhythmic, no gallops, rubs, or murmurs. + pitting lower extremity edema. Pulmonary: positive  breath sounds bilaterally, adequate air movement, no wheezing, rhonchi or rales. Gastrointestinal. Abdomen protuberant with no organomegaly, non tender, no rebound or guarding Skin. No rashes Musculoskeletal: no joint deformities     Data Reviewed: I have personally reviewed following labs and imaging studies  CBC: Recent Labs  Lab 09/22/18 1730  WBC 9.1  HGB 10.4*  HCT 34.6*  MCV 82.0  PLT 161   Basic Metabolic Panel: Recent Labs  Lab 09/22/18 1730 09/24/18 0347 09/25/18 0643 09/26/18 0322 09/27/18 0303  NA 141 140 138 138 136  K 4.0 3.6 3.8 3.3* 3.3*  CL 102 99 99 95* 94*  CO2 _0 GLUCOSE 110* 123* 141* 114* 110*  BUN _1 25* 38*  CREATININE 1.38* 1.30* 1.35* 1.49* 1.79*  CALCIUM 9.1 8.6* 8.8* 8.9 9.3   GFR: Estimated Creatinine Clearance: 47 mL/min (A) (by C-G formula based on SCr of 1.79 mg/dL (H)). Liver Function Tests: No results for input(s): AST, ALT, ALKPHOS, BILITOT, PROT, ALBUMIN in the last 168 hours. No results for input(s): LIPASE, AMYLASE in the last 168 hours. No results for input(s): AMMONIA in the last 168 hours. Coagulation Profile: No results for input(s): INR, PROTIME in the last 168 hours. Cardiac Enzymes: No results for input(s): CKTOTAL, CKMB, CKMBINDEX, TROPONINI in the last 168 hours. BNP (last 3 results) Recent Labs    01/16/18 0000  PROBNP 652*   HbA1C: No results for input(s): HGBA1C in the last 72 hours. CBG: Recent Labs  Lab 09/26/18 1234 09/26/18 1630 09/26/18 2159 09/27/18 0828 09/27/18 1554  GLUCAP 142* 111* 129* 131* 108*   Lipid Profile: No results for input(s): CHOL, HDL, LDLCALC, TRIG, CHOLHDL, LDLDIRECT in the last 72 hours. Thyroid Function Tests: No results for input(s): TSH, T4TOTAL, FREET4, T3FREE, THYROIDAB in the last 72 hours. Anemia Panel: No results for input(s): VITAMINB12, FOLATE, FERRITIN, TIBC, IRON, RETICCTPCT in the last 72 hours.    Radiology Studies: I have reviewed all  of the imaging during this hospital visit personally     Scheduled Meds: . atorvastatin  40 mg Oral Daily  . bisoprolol  2.5 mg Oral Daily  . buPROPion  150 mg Oral Daily  . cholecalciferol  5,000 Units Oral Daily  . heparin injection (subcutaneous)  5,000 Units Subcutaneous Q8H  . insulin aspart  0-9 Units Subcutaneous Q4H  . mometasone-formoterol  2 puff Inhalation BID  . sertraline  50 mg Oral Daily   Continuous Infusions:   LOS: 4 days        Alma Friendly, MD

## 2018-09-28 ENCOUNTER — Telehealth: Payer: Self-pay | Admitting: Family Medicine

## 2018-09-28 DIAGNOSIS — E785 Hyperlipidemia, unspecified: Secondary | ICD-10-CM

## 2018-09-28 DIAGNOSIS — E1169 Type 2 diabetes mellitus with other specified complication: Secondary | ICD-10-CM

## 2018-09-28 LAB — GLUCOSE, CAPILLARY: Glucose-Capillary: 128 mg/dL — ABNORMAL HIGH (ref 70–99)

## 2018-09-28 LAB — BASIC METABOLIC PANEL
Anion gap: 14 (ref 5–15)
BUN: 31 mg/dL — ABNORMAL HIGH (ref 8–23)
CO2: 26 mmol/L (ref 22–32)
Calcium: 9.6 mg/dL (ref 8.9–10.3)
Chloride: 97 mmol/L — ABNORMAL LOW (ref 98–111)
Creatinine, Ser: 1.43 mg/dL — ABNORMAL HIGH (ref 0.61–1.24)
GFR calc Af Amer: 56 mL/min — ABNORMAL LOW (ref >60)
GFR calc non Af Amer: 49 mL/min — ABNORMAL LOW (ref >60)
Glucose, Bld: 108 mg/dL — ABNORMAL HIGH (ref 70–99)
Potassium: 4 mmol/L (ref 3.5–5.1)
Sodium: 137 mmol/L (ref 135–145)

## 2018-09-28 LAB — CBC WITH DIFFERENTIAL/PLATELET
Abs Immature Granulocytes: 0.05 10*3/uL (ref 0.00–0.07)
Basophils Absolute: 0.1 10*3/uL (ref 0.0–0.1)
Basophils Relative: 1 %
Eosinophils Absolute: 0.2 10*3/uL (ref 0.0–0.5)
Eosinophils Relative: 1 %
HCT: 41.4 % (ref 39.0–52.0)
Hemoglobin: 13.2 g/dL (ref 13.0–17.0)
Immature Granulocytes: 0 %
Lymphocytes Relative: 24 %
Lymphs Abs: 2.7 10*3/uL (ref 0.7–4.0)
MCH: 25.4 pg — ABNORMAL LOW (ref 26.0–34.0)
MCHC: 31.9 g/dL (ref 30.0–36.0)
MCV: 79.8 fL — ABNORMAL LOW (ref 80.0–100.0)
Monocytes Absolute: 0.8 10*3/uL (ref 0.1–1.0)
Monocytes Relative: 7 %
Neutro Abs: 7.6 10*3/uL (ref 1.7–7.7)
Neutrophils Relative %: 67 %
Platelets: 317 10*3/uL (ref 150–400)
RBC: 5.19 MIL/uL (ref 4.22–5.81)
RDW: 16.1 % — ABNORMAL HIGH (ref 11.5–15.5)
WBC: 11.5 10*3/uL — ABNORMAL HIGH (ref 4.0–10.5)
nRBC: 0 % (ref 0.0–0.2)

## 2018-09-28 MED ORDER — FUROSEMIDE 40 MG PO TABS: ORAL_TABLET | ORAL | 3 refills | Status: DC

## 2018-09-28 NOTE — Discharge Instructions (Signed)
Reducing Sodium in diet    Goal: <2,000 mg sodium daily  Fluid restriction: 1200 ml / 24 hours  At the store/while shopping for food:  Choose packaged and prepared foods carefully. Compare labels and choose the product with the lowest amount of sodium (per serving) you can find in your store. You might be surprised that different brands of the same food can have different sodium levels.  Pick fresh and frozen poultry that hasnt been injected with a sodium solution. Check the fine print on the packaging for terms like broth, saline or sodium solution. Sodium levels in unseasoned fresh meats are around 100 milligrams (mg) or less per 4-ounce serving.  Select condiments with care. For example, soy sauce, bottled salad dressings, dips, ketchup, jarred salsas, capers, mustard, pickles, olives and relish can be sky-high in sodium. Look for a reduced- or lower-sodium version.  Opt for canned vegetables labeled no salt added and frozen vegetables without salty sauces. When theyre added to a casserole, soup or other mixed dish, there are so many other ingredients involved that you wont miss the salt.  When preparing food:  Use onions, garlic, herbs, spices, citrus juices and vinegars in place of some or all of the salt to add flavor. Our recipes and tips can help!  Drain and rinse canned beans (like chickpeas, kidney beans, etc.) and vegetables. Youll cut the sodium by up to 40 percent.  Combine lower-sodium versions of food with regular versions. If you dont like the taste of lower-sodium foods right now, try combining them in equal parts with a regular version of the same food. Youll get less salt and probably wont notice much difference in taste. This works especially well for broths, soups and tomato-based pasta sauces.  Cook pasta, rice and hot cereal without salt. Youre likely going to add other flavorful ingredients, so you wont miss the salt.  Cook by grilling, braising,  roasting, searing and sauting to bring out natural flavors. This will reduce the need to add salt.  Incorporate foods with potassium like sweet potatoes, potatoes, greens, tomatoes and lower-sodium tomato sauce, white beans, kidney beans, nonfat yogurt, oranges, bananas and cantaloupe. Potassium helps counter the effects of sodium and may help lower your blood pressure.  At restaurants:  Tell them how you like it. Ask for your dish to be made without extra salt.  Taste your food before adding salt. If you think it needs a boost of flavor, add freshly ground black pepper or a squeeze of fresh lemon or lime and test it again before adding salt. Lemon and pepper are especially good on fish, chicken and vegetables.  Watch out for these food words: pickled, brined, barbecued, cured, smoked, broth, au jus, soy sauce, miso or teriyaki sauce. These tend to be high in sodium. Foods that are steamed, baked, grilled, poached or roasted may have less sodium.  Control portion sizes. When you cut calories, you usually cut the sodium too. Ask if smaller portions are available, share the meal with a friend or ask for a to-go box when you order and place half the meal in the box to eat later.  From the [employer]

## 2018-09-28 NOTE — Telephone Encounter (Signed)
Yes that is fine, make sure he can actually be seen because if he is follow-up for respiratory he can come here for 21 days

## 2018-09-28 NOTE — Progress Notes (Signed)
Pt self administers CPAP when he is ready for bed

## 2018-09-28 NOTE — Telephone Encounter (Signed)
Pt made.  Patient aware

## 2018-09-28 NOTE — Discharge Summary (Signed)
Discharge Summary  Kenneth Fuller SWF:[id number] DOB: [birth year]  PCP: [name], [name], MD  Admit date: 09/22/2018 Discharge date: 09/28/2018  Time spent: 40 mins  Recommendations for Outpatient Follow-up:  1. PCP in 1 week 2. Nephrology in 1 week 3. Cardiology in 1 week   Discharge Diagnoses:  Active Hospital Problems   Diagnosis Date Noted  . Chest pain 09/23/2018  . Acute on chronic diastolic CHF (congestive heart failure) ([place]) 09/23/2018  . CKD (chronic kidney disease), stage III ([place]) 01/04/2018  . Chronic diastolic heart failure ([place]) 04/13/2017  . Coronary artery disease involving native coronary artery of native heart without angina pectoris 12/05/2014  . Hyperlipidemia associated with type 2 diabetes mellitus ([place]) 10/25/2013  . Diabetes mellitus type II, non insulin dependent ([place]) 02/25/2007  . Essential hypertension 02/25/2007  . Gout 02/25/2007    Resolved Hospital Problems  No resolved problems to display.    Discharge Condition: Stable  Diet recommendation: Heart healthy  Vitals:   09/28/18 0830 09/28/18 0834  BP:  140/70  Pulse: 65   Resp: 18   Temp:    SpO2: 94%     History of present illness:  73 year old male who presented with chest pain. He does have significant past medical history for hypertension, dyslipidemia, type II that is mellitus, COPD, coronary artery disease who presents with chest pain. Reports upper sternal chest pressure for the last 3 days, persistent in nature, associated with dyspnea, symptoms worse with exertion. Chest film with no infiltrates, chest CT negative for PE. EKG 66 bpm, with normal axis, normal intervals, with no st segment or T wave changes. Patient was admitted to the hospital with the working diagnosis of chest pain to rule out acute coronary syndrome, in the setting of acute decompensation of chronic heart failure.   Today, patient denies any new complaints, denies any further dizziness, chest  pain/pressure, shortness of breath, abdominal pain, nausea/vomiting, fever/chills.  Patient stable to be discharged home with close follow-up with PCP, cardiology, nephrology.  Patient reported poor compliance with his Lasix, advised patient to be very compliant with his Lasix and follow-up appointments.  Advised patient about diet modification.  Hospital Course:  Principal Problem:   Chest pain Active Problems:   Diabetes mellitus type II, non insulin dependent (HCC)   Gout   Essential hypertension   Hyperlipidemia associated with type 2 diabetes mellitus ([place])   Coronary artery disease involving native coronary artery of native heart without angina pectoris   Chronic diastolic heart failure (HCC)   CKD (chronic kidney disease), stage III (HCC)   Acute on chronic diastolic CHF (congestive heart failure) (HCC)   Acute on chronic diastolic heart failure exacerbation/ EF 50 to 55% with moderately dilated cavity with mild LVH Improved, with adequate diuresis, about 10,310 mils negative fluid balance Patient was diuresed adequately with torsemide, but worsening her renal function, so discontinued torsemide Advised patient to be more compliant with his home dose of Lasix (reports he does not take it always, especially when he has to drive around for long distances) If Lasix not adequate enough for diuresis, may consider switching over to a lower dose of torsemide per PCP/cardiology/nephrology Advised diet modification Follow-up with PCP/cardiology in 1 week, with repeat labs  HTN Continue bisoprolol, hydralazine, lisinopril Follow-up with PCP  Mild AKI on CKD stage 3 with hypokalemia Improved Baseline creatinine around 1.5 Spoke to nephrology on 09/27/2018, recommended holding torsemide for now and rechecking BMP Follow-up with outpatient nephrology, with repeat labs  Diabetes  mellitus type 2 Continue home regimen  CAD Currently chest pain-free Continue Lipitor  Chronic  hypoxic respiratory failure due to COPD, complicated with core pulmonale/ Suspected pulmonary HTN due to OSA and COPD Stable Continue home supplemental oxygen PRN  Morbid obesity BMI 42.66 Lifestyle modification advised  Depression Continue home sertraline, clonazepam and bupropion.             Malnutrition Type:      Malnutrition Characteristics:      Nutrition Interventions:      Estimated body mass index is 42.43 kg/m as calculated from the following:   Height as of this encounter: _0  (1.702 m).   Weight as of this encounter: 122.9 kg.    Procedures:  None  Consultations:  Spoke to nephrology on 09/27/2018  Discharge Exam: BP 140/70   Pulse 65   Temp 97.8 F (36.6 C) (Oral)   Resp 18   Ht _1  (1.702 m)   Wt 122.9 kg   SpO2 94%   BMI 42.43 kg/m   General: NAD Cardiovascular: S1, S2 present Respiratory: CTA B  Discharge Instructions You were cared for by a hospitalist during your hospital stay. If you have any questions about your discharge medications or the care you received while you were in the hospital after you are discharged, you can call the unit and asked to speak with the hospitalist on call if the hospitalist that took care of you is not available. Once you are discharged, your primary care physician will handle any further medical issues. Please note that NO REFILLS for any discharge medications will be authorized once you are discharged, as it is imperative that you return to your primary care physician (or establish a relationship with a primary care physician if you do not have one) for your aftercare needs so that they can reassess your need for medications and monitor your lab values.   Allergies as of 09/28/2018      Reactions   Amlodipine Besy-benazepril Hcl Swelling, Other (See Comments)   Makes tongue swell (lotrel)   Oxycodone Itching   Phenergan [promethazine Hcl] Other (See Comments)   "I can't remember."    Hydrocodone Itching   Can tolerate in low doses      Medication List    STOP taking these medications   potassium chloride SA 20 MEQ tablet Commonly known as: K-DUR   sulfamethoxazole-trimethoprim 800-160 MG tablet Commonly known as: Bactrim DS     TAKE these medications   Accu-Chek Aviva device Use as instructed   albuterol 108 (90 Base) MCG/ACT inhaler Commonly known as: VENTOLIN HFA Inhale 2 puffs into the lungs every 6 (six) hours as needed for wheezing or shortness of breath.   atorvastatin 40 MG tablet Commonly known as: LIPITOR Take 1 tablet by mouth once daily   bisoprolol 5 MG tablet Commonly known as: ZEBETA Take 0.5 tablets (2.5 mg total) by mouth daily.   Blood Pressure Cuff Misc 1 each by Does not apply route daily.   budesonide-formoterol 160-4.5 MCG/ACT inhaler Commonly known as: Symbicort Inhale 2 puffs into the lungs 2 (two) times daily.   clonazePAM 0.5 MG tablet Commonly known as: KLONOPIN TAKE 1 TABLET BY MOUTH AT BEDTIME AS NEEDED   furosemide 40 MG tablet Commonly known as: LASIX Take 2 tablets (80 mg total) by mouth every morning. Take extra 40 mg tablet as directed. What changed:   how much to take  how to take this  when to take this  glimepiride 4 MG tablet Commonly known as: AMARYL TAKE 1 TABLET BY MOUTH WITH BREAKFAST   hydrALAZINE 25 MG tablet Commonly known as: APRESOLINE Take 1 tablet (25 mg total) by mouth 3 (three) times daily.   isosorbide dinitrate 30 MG tablet Commonly known as: ISORDIL Take 1 tablet (30 mg total) by mouth 3 (three) times daily.   lisinopril 20 MG tablet Commonly known as: ZESTRIL Take 1 tablet (20 mg total) by mouth daily.   mometasone-formoterol 100-5 MCG/ACT Aero Commonly known as: DULERA Inhale 2 puffs into the lungs 2 (two) times daily.   OneTouch Delica Lancets 88Q Misc 1 each by Does not apply route daily. Test 1X per day and prn   accu-chek soft touch lancets 1 each by Other route  daily. Use as instructed   OXYGEN Inhale 2 L into the lungs at bedtime. 2lpm with sleep   Potassium Chloride ER 20 MEQ Tbcr Take 1 tablet by mouth daily.   Prodigy No Coding Blood Gluc test strip Generic drug: glucose blood CHECK BLOOD SUGARS ONCE DAILY   glucose blood test strip Commonly known as: Accu-Chek Aviva 1 each by Other route daily. Use as instructed   Pulse Oximeter Misc 1 each by Does not apply route continuous as needed.   sertraline 50 MG tablet Commonly known as: ZOLOFT Take 1 tablet by mouth once daily   VITAMIN D PO Take 5,000 Units by mouth every morning.   zolpidem 12.5 MG CR tablet Commonly known as: AMBIEN CR TAKE 1 TABLET BY MOUTH AT BEDTIME AS NEEDED FOR SLEEP What changed:   reasons to take this  additional instructions      Allergies  Allergen Reactions  . Amlodipine Besy-Benazepril Hcl Swelling and Other (See Comments)    Makes tongue swell (lotrel)  . Oxycodone Itching  . Phenergan [Promethazine Hcl] Other (See Comments)    "I can't remember."  . Hydrocodone Itching    Can tolerate in low doses   Follow-up Information    [name], [name], MD. Schedule an appointment as soon as possible for a visit in 1 week(s).   Specialties: Family Medicine, Cardiology Contact information: [place] [address] [phone]        Croitoru, Dani Gobble, MD. Schedule an appointment as soon as possible for a visit in 1 week(s).   Specialty: Cardiology Why: Let them know you were just discharged from the hospital, thanks Contact information: [address] [place] [address] [phone]        Nephrology (Kidney doctor). Schedule an appointment as soon as possible for a visit in 1 week(s).   Why: Tell them you were just discharged from the hospital           The results of significant diagnostics from this hospitalization (including imaging, microbiology, ancillary and laboratory) are listed below for  reference.    Significant Diagnostic Studies: Ct Angio Chest Pe W And/or Wo Contrast  Result Date: 09/23/2018 CLINICAL DATA:  73 year old male with COPD presenting with increased shortness of breath. EXAM: CT ANGIOGRAPHY CHEST WITH CONTRAST TECHNIQUE: Multidetector CT imaging of the chest was performed using the standard protocol during bolus administration of intravenous contrast. Multiplanar CT image reconstructions and MIPs were obtained to evaluate the vascular anatomy. CONTRAST:  170m OMNIPAQUE IOHEXOL 350 MG/ML SOLN COMPARISON:  Chest CT dated 07/04/2017 FINDINGS: Cardiovascular: Stable cardiomegaly. No pericardial effusion. Mild atherosclerotic calcification of the thoracic aorta. There is no CT evidence of pulmonary embolism. Mediastinum/Nodes: No hilar or  mediastinal adenopathy. The esophagus and the thyroid gland are grossly unremarkable. No mediastinal fluid collection. Lungs/Pleura: Minimal left lung base linear atelectasis. There is no focal consolidation, pleural effusion, or pneumothorax. The central airways are patent. Upper Abdomen: No acute abnormality. Musculoskeletal: Degenerative changes of the spine. No acute osseous pathology partially visualized lower cervical ACDF. Review of the MIP images confirms the above findings. IMPRESSION: 1. No acute intrathoracic pathology. No CT evidence of pulmonary embolism. 2. Stable cardiomegaly. 3. Aortic Atherosclerosis (ICD10-I70.0). Electronically Signed   By: Anner Crete M.D.   On: 09/23/2018 00:08   Dg Chest Port 1 View  Result Date: 09/22/2018 CLINICAL DATA:  Increased short of breath EXAM: PORTABLE CHEST 1 VIEW COMPARISON:  01/04/2018 FINDINGS: Hardware in the cervical spine. Borderline cardiomegaly. Mild central congestion without acute acute focal opacity or pleural effusion. Aortic atherosclerosis. No pneumothorax. IMPRESSION: Borderline to mild cardiomegaly with slight central congestion Electronically Signed   By: Donavan Foil M.D.    On: 09/22/2018 18:05    Microbiology: Recent Results (from the past 240 hour(s))  SARS Coronavirus 2 [facility] order, Performed in [facility] hospital lab) Nasopharyngeal Nasopharyngeal Swab     Status: None   Collection Time: 09/22/18  8:59 PM   Specimen: Nasopharyngeal Swab  Result Value Ref Range Status   SARS Coronavirus 2 NEGATIVE NEGATIVE Final    Comment: (NOTE) If result is NEGATIVE SARS-CoV-2 target nucleic acids are NOT DETECTED. The SARS-CoV-2 RNA is generally detectable in upper and lower  respiratory specimens during the acute phase of infection. The lowest  concentration of SARS-CoV-2 viral copies this assay can detect is 250  copies / mL. A negative result does not preclude SARS-CoV-2 infection  and should not be used as the sole basis for treatment or other  patient management decisions.  A negative result may occur with  improper specimen collection / handling, submission of specimen other  than nasopharyngeal swab, presence of viral mutation(s) within the  areas targeted by this assay, and inadequate number of viral copies  (<250 copies / mL). A negative result must be combined with clinical  observations, patient history, and epidemiological information. If result is POSITIVE SARS-CoV-2 target nucleic acids are DETECTED. The SARS-CoV-2 RNA is generally detectable in upper and lower  respiratory specimens dur ing the acute phase of infection.  Positive  results are indicative of active infection with SARS-CoV-2.  Clinical  correlation with patient history and other diagnostic information is  necessary to determine patient infection status.  Positive results do  not rule out bacterial infection or co-infection with other viruses. If result is PRESUMPTIVE POSTIVE SARS-CoV-2 nucleic acids MAY BE PRESENT.   A presumptive positive result was obtained on the submitted specimen  and confirmed on repeat testing.  While 2019 novel coronavirus  (SARS-CoV-2) nucleic  acids may be present in the submitted sample  additional confirmatory testing may be necessary for epidemiological  and / or clinical management purposes  to differentiate between  SARS-CoV-2 and other Sarbecovirus currently known to infect humans.  If clinically indicated additional testing with an alternate test  methodology [phone]) is advised. The SARS-CoV-2 RNA is generally  detectable in upper and lower respiratory sp ecimens during the acute  phase of infection. The expected result is Negative. Fact Sheet for Patients:  StrictlyIdeas.no Fact Sheet for Healthcare Providers: BankingDealers.co.za This test is not yet approved or cleared by the [place] FDA and has been authorized for detection and/or diagnosis of SARS-CoV-2 by FDA under an Emergency  Use Authorization (EUA).  This EUA will remain in effect (meaning this test can be used) for the duration of the COVID-19 declaration under Section 564(b)(1) of the Act, 21 U.S.C. section 360bbb-3(b)(1), unless the authorization is terminated or revoked sooner. Performed at [facility], [address], [place], [place] [address]      Labs: Basic Metabolic Panel: Recent Labs  Lab 09/24/18 0347 09/25/18 0643 09/26/18 0322 09/27/18 0303 09/28/18 0419  NA 140 138 138 136 137  K 3.6 3.8 3.3* 3.3* 4.0  CL 99 99 95* 94* 97*  CO2 _0 GLUCOSE 123* 141* 114* 110* 108*  BUN 21 21 25* 38* 31*  CREATININE 1.30* 1.35* 1.49* 1.79* 1.43*  CALCIUM 8.6* 8.8* 8.9 9.3 9.6   Liver Function Tests: No results for input(s): AST, ALT, ALKPHOS, BILITOT, PROT, ALBUMIN in the last 168 hours. No results for input(s): LIPASE, AMYLASE in the last 168 hours. No results for input(s): AMMONIA in the last 168 hours. CBC: Recent Labs  Lab 09/22/18 1730 09/28/18 0419  WBC 9.1 11.5*  NEUTROABS  --  7.6  HGB 10.4* 13.2  HCT 34.6* 41.4  MCV 82.0 79.8*  PLT 248 317   Cardiac Enzymes: No  results for input(s): CKTOTAL, CKMB, CKMBINDEX, TROPONINI in the last 168 hours. BNP: BNP (last 3 results) Recent Labs    01/04/18 1843 09/22/18 1730  BNP 330.0* 176.0*    ProBNP (last 3 results) Recent Labs    01/16/18 0000  PROBNP 652*    CBG: Recent Labs  Lab 09/26/18 2159 09/27/18 0828 09/27/18 1554 09/27/18 2234 09/28/18 0733  GLUCAP 129* 131* 108* 158* 128*       Signed:  Alma Friendly, MD [facility] 09/28/2018, 11:03 AM

## 2018-09-28 NOTE — Telephone Encounter (Signed)
appt made per CB

## 2018-09-28 NOTE — Telephone Encounter (Signed)
That is fine he can be seen, he always has a cough, and long as it is not worse than his normal cough he can be seen

## 2018-09-28 NOTE — Telephone Encounter (Signed)
Patient states that he has a slight cough and congestion. Negative COVID test results 6 days ago.  No fevers, diagnosed with CHF

## 2018-09-28 NOTE — Plan of Care (Signed)
Nutrition Education Note  RD consulted for nutrition education regarding CHF. Patient advised to follow a 2 gm sodium diet with 1200 ml fluid restriction.  **[place]. Answered all questions pt had about diet.   RD to provide Low Sodium diet information and fluid restriction in discharge instructions. RD reviewed patient's dietary recall. Provided examples on ways to decrease sodium intake in diet. Discouraged intake of processed foods and use of salt shaker. Encouraged fresh fruits and vegetables as well as whole grain sources of carbohydrates to maximize fiber intake.   RD discussed why it is important for patient to adhere to diet recommendations, and emphasized the role of fluids, foods to avoid, and importance of weighing self daily. Teach back method used.  Expect good compliance.  Body mass index is 42.43 kg/m. Pt meets criteria for morbid obesity based on current BMI.  Current diet order is Heart Healthy/ CHO modified, patient is consuming approximately 75% of meals at this time. Labs and medications reviewed. No further nutrition interventions warranted at this time. If additional nutrition issues arise, please re-consult RD.   Kenneth Bibles, MS, RD, LDN Inpatient Clinical Dietitian Pager: [phone] After Hours Pager: [phone]

## 2018-10-02 ENCOUNTER — Other Ambulatory Visit: Payer: Self-pay

## 2018-10-02 ENCOUNTER — Other Ambulatory Visit: Payer: PPO

## 2018-10-02 DIAGNOSIS — E1142 Type 2 diabetes mellitus with diabetic polyneuropathy: Secondary | ICD-10-CM

## 2018-10-03 DIAGNOSIS — E1121 Type 2 diabetes mellitus with diabetic nephropathy: Secondary | ICD-10-CM | POA: Diagnosis not present

## 2018-10-03 DIAGNOSIS — I10 Essential (primary) hypertension: Secondary | ICD-10-CM | POA: Diagnosis not present

## 2018-10-03 DIAGNOSIS — N183 Chronic kidney disease, stage 3 (moderate): Secondary | ICD-10-CM | POA: Diagnosis not present

## 2018-10-03 DIAGNOSIS — G4733 Obstructive sleep apnea (adult) (pediatric): Secondary | ICD-10-CM | POA: Diagnosis not present

## 2018-10-03 DIAGNOSIS — R809 Proteinuria, unspecified: Secondary | ICD-10-CM | POA: Diagnosis not present

## 2018-10-03 DIAGNOSIS — I509 Heart failure, unspecified: Secondary | ICD-10-CM | POA: Diagnosis not present

## 2018-10-03 LAB — MICROALBUMIN / CREATININE URINE RATIO
Creatinine, Urine: 59.6 mg/dL
Microalb/Creat Ratio: 9 mg/g creat (ref 0–29)
Microalbumin, Urine: 5.2 ug/mL

## 2018-10-04 ENCOUNTER — Other Ambulatory Visit: Payer: Self-pay

## 2018-10-05 ENCOUNTER — Ambulatory Visit (INDEPENDENT_AMBULATORY_CARE_PROVIDER_SITE_OTHER): Payer: PPO | Admitting: Family Medicine

## 2018-10-05 ENCOUNTER — Encounter: Payer: Self-pay | Admitting: Family Medicine

## 2018-10-05 VITALS — BP 118/69 | HR 64 | Temp 98.6°F | Ht 67.0 in | Wt 275.6 lb

## 2018-10-05 DIAGNOSIS — F411 Generalized anxiety disorder: Secondary | ICD-10-CM | POA: Diagnosis not present

## 2018-10-05 DIAGNOSIS — I10 Essential (primary) hypertension: Secondary | ICD-10-CM

## 2018-10-05 DIAGNOSIS — I5042 Chronic combined systolic (congestive) and diastolic (congestive) heart failure: Secondary | ICD-10-CM | POA: Diagnosis not present

## 2018-10-05 DIAGNOSIS — F339 Major depressive disorder, recurrent, unspecified: Secondary | ICD-10-CM

## 2018-10-05 MED ORDER — BISOPROLOL FUMARATE 5 MG PO TABS: 2.5000 mg | ORAL_TABLET | Freq: Every day | ORAL | 3 refills | Status: DC

## 2018-10-05 MED ORDER — CLONAZEPAM 0.5 MG PO TABS: 0.5000 mg | ORAL_TABLET | Freq: Every evening | ORAL | 2 refills | Status: DC | PRN

## 2018-10-05 MED ORDER — SERTRALINE HCL 50 MG PO TABS: 50.0000 mg | ORAL_TABLET | Freq: Every day | ORAL | 1 refills | Status: DC

## 2018-10-05 MED ORDER — LISINOPRIL 20 MG PO TABS: 20.0000 mg | ORAL_TABLET | Freq: Every day | ORAL | 0 refills | Status: DC

## 2018-10-05 MED ORDER — HYDRALAZINE HCL 10 MG PO TABS: 10.0000 mg | ORAL_TABLET | Freq: Three times a day (TID) | ORAL | 3 refills | Status: DC

## 2018-10-05 NOTE — Progress Notes (Signed)
BP 118/69   Pulse 64   Temp 98.6 F (37 C) (Temporal)   Ht _0  (1.702 m)   Wt 275 lb 9.6 oz (125 kg)   BMI 43.17 kg/m    Subjective:   Patient ID: Kenneth Fuller, male    DOB: January 25, 1946, 73 y.o.   MRN: [id number]  HPI: Kenneth Fuller is a 73 y.o. male presenting on 10/05/2018 for Hospitalization Follow-up (8/14- [facility]- CHF), Fatigue, and Dizziness   HPI Hospital follow-up for CHF and also having fatigue and dizziness. Patient is coming in today for hospital follow-up for which he was in the hospital on 09/22/2018 for congestive heart failure and dizziness.  Since that time he has been having some fatigue and still some dizziness but it is gradually improving.  Patient denies any shortness of breath or swelling.  He says his weight is actually coming down with a diuretic.  Patient still been having some lightheadedness and dizziness and his blood pressure has been down even more than it is here in the office, he says at home it is been as low as 90 systolic.  He says he gets lightheaded when he is up and on his feet or when he gets up.  Hypertension Patient is currently on hydralazine and lisinopril and bisoprolol, and their blood pressure today is 118/69. Patient denies any lightheadedness or dizziness. Patient denies headaches, blurred vision, chest pains, shortness of breath, or weakness. Denies any side effects from medication and is content with current medication.   Patient is coming in for anxiety and depression recheck. Patient is currently taking Zoloft and clonazepam currently and says that they are working mostly well for his mood and anxiety.  He says his health issues have been playing a factor into his mood and anxiety but he still feels like the medication is working mostly well and does not want to change currently.  Patient denies any suicidal ideations or thoughts of hurting himself.  Relevant past medical, surgical, family and social history reviewed and updated  as indicated. Interim medical history since our last visit reviewed. Allergies and medications reviewed and updated.  Review of Systems  Constitutional: Negative for chills and fever.  Respiratory: Negative for shortness of breath and wheezing.   Cardiovascular: Negative for chest pain, palpitations and leg swelling.  Musculoskeletal: Negative for back pain and gait problem.  Skin: Negative for rash.  Neurological: Positive for light-headedness. Negative for weakness.  All other systems reviewed and are negative.   Per HPI unless specifically indicated above   Allergies as of 10/05/2018      Reactions   Amlodipine Besy-benazepril Hcl Swelling, Other (See Comments)   Makes tongue swell (lotrel)   Oxycodone Itching   Phenergan [promethazine Hcl] Other (See Comments)   "I can't remember."   Hydrocodone Itching   Can tolerate in low doses      Medication List       Accurate as of October 05, 2018  4:18 PM. If you have any questions, ask your nurse or doctor.        Accu-Chek Aviva device Use as instructed   albuterol 108 (90 Base) MCG/ACT inhaler Commonly known as: VENTOLIN HFA Inhale 2 puffs into the lungs every 6 (six) hours as needed for wheezing or shortness of breath.   atorvastatin 40 MG tablet Commonly known as: LIPITOR Take 1 tablet by mouth once daily   bisoprolol 5 MG tablet Commonly known as: ZEBETA Take 0.5 tablets (2.5 mg total)  by mouth daily.   Blood Pressure Cuff Misc 1 each by Does not apply route daily.   budesonide-formoterol 160-4.5 MCG/ACT inhaler Commonly known as: Symbicort Inhale 2 puffs into the lungs 2 (two) times daily.   clonazePAM 0.5 MG tablet Commonly known as: KLONOPIN TAKE 1 TABLET BY MOUTH AT BEDTIME AS NEEDED   furosemide 40 MG tablet Commonly known as: LASIX Take 2 tablets (80 mg total) by mouth every morning. Take extra 40 mg tablet as directed.   glimepiride 4 MG tablet Commonly known as: AMARYL TAKE 1 TABLET BY MOUTH  WITH BREAKFAST   hydrALAZINE 25 MG tablet Commonly known as: APRESOLINE Take 1 tablet (25 mg total) by mouth 3 (three) times daily.   isosorbide dinitrate 30 MG tablet Commonly known as: ISORDIL Take 1 tablet (30 mg total) by mouth 3 (three) times daily.   lisinopril 20 MG tablet Commonly known as: ZESTRIL Take 1 tablet (20 mg total) by mouth daily.   mometasone-formoterol 100-5 MCG/ACT Aero Commonly known as: DULERA Inhale 2 puffs into the lungs 2 (two) times daily.   OneTouch Delica Lancets 42H Misc 1 each by Does not apply route daily. Test 1X per day and prn   accu-chek soft touch lancets 1 each by Other route daily. Use as instructed   OXYGEN Inhale 2 L into the lungs at bedtime. 2lpm with sleep   Potassium Chloride ER 20 MEQ Tbcr Take 1 tablet by mouth daily.   Prodigy No Coding Blood Gluc test strip Generic drug: glucose blood CHECK BLOOD SUGARS ONCE DAILY   glucose blood test strip Commonly known as: Accu-Chek Aviva 1 each by Other route daily. Use as instructed   Pulse Oximeter Misc 1 each by Does not apply route continuous as needed.   sertraline 50 MG tablet Commonly known as: ZOLOFT Take 1 tablet by mouth once daily   VITAMIN D PO Take 5,000 Units by mouth every morning.   zolpidem 12.5 MG CR tablet Commonly known as: AMBIEN CR TAKE 1 TABLET BY MOUTH AT BEDTIME AS NEEDED FOR SLEEP What changed:   reasons to take this  additional instructions        Objective:   BP 118/69   Pulse 64   Temp 98.6 F (37 C) (Temporal)   Ht _0  (1.702 m)   Wt 275 lb 9.6 oz (125 kg)   BMI 43.17 kg/m   Wt Readings from Last 3 Encounters:  10/05/18 275 lb 9.6 oz (125 kg)  09/28/18 270 lb 14.4 oz (122.9 kg)  08/11/18 277 lb (125.6 kg)    Physical Exam Vitals signs and nursing note reviewed.  Constitutional:      General: He is not in acute distress.    Appearance: He is well-developed. He is not diaphoretic.  Eyes:     General: No scleral icterus.     Conjunctiva/sclera: Conjunctivae normal.  Neck:     Musculoskeletal: Neck supple.     Thyroid: No thyromegaly.  Cardiovascular:     Rate and Rhythm: Normal rate and regular rhythm.     Heart sounds: Normal heart sounds. No murmur.  Pulmonary:     Effort: Pulmonary effort is normal. No respiratory distress.     Breath sounds: Normal breath sounds. No wheezing.  Musculoskeletal: Normal range of motion.        General: No swelling.  Lymphadenopathy:     Cervical: No cervical adenopathy.  Skin:    General: Skin is warm and dry.  Findings: No rash.  Neurological:     Mental Status: He is alert and oriented to person, place, and time.     Coordination: Coordination normal.  Psychiatric:        Behavior: Behavior normal.       Assessment & Plan:   Problem List Items Addressed This Visit      Cardiovascular and Mediastinum   Essential hypertension   Relevant Medications   bisoprolol (ZEBETA) 5 MG tablet   lisinopril (ZESTRIL) 20 MG tablet   hydrALAZINE (APRESOLINE) 10 MG tablet   Other Relevant Orders   CBC with Differential/Platelet (Completed)   CMP14+EGFR (Completed)   CHF (congestive heart failure) (HCC) - Primary   Relevant Medications   bisoprolol (ZEBETA) 5 MG tablet   lisinopril (ZESTRIL) 20 MG tablet   hydrALAZINE (APRESOLINE) 10 MG tablet   Other Relevant Orders   CBC with Differential/Platelet (Completed)   CMP14+EGFR (Completed)     Other   Depression, recurrent (HCC)   Relevant Medications   clonazePAM (KLONOPIN) 0.5 MG tablet   sertraline (ZOLOFT) 50 MG tablet    Other Visit Diagnoses    Anxiety state       Relevant Medications   clonazePAM (KLONOPIN) 0.5 MG tablet   sertraline (ZOLOFT) 50 MG tablet      Continue patient's current medication for depression and anxiety including Klonopin and Zoloft and gave refills for such.  We lowered his dose of the hydralazine from 25 mg 3 times daily to 10 mg 3 times daily because he has been having  orthostatic dizziness and he has been very lightheaded and weak and will make this adjustment.  Have sent to pharmacy. Follow up plan: Return in about 4 weeks (around 11/02/2018), or if symptoms worsen or fail to improve, for Blood pressure and CHF recheck.  Counseling provided for all of the vaccine components No orders of the defined types were placed in this encounter.   Caryl Pina, MD [facility] 10/05/2018, 4:18 PM

## 2018-10-06 LAB — CBC WITH DIFFERENTIAL/PLATELET
Basophils Absolute: 0.1 10*3/uL (ref 0.0–0.2)
Basos: 1 %
EOS (ABSOLUTE): 0.2 10*3/uL (ref 0.0–0.4)
Eos: 2 %
Hematocrit: 37.5 % (ref 37.5–51.0)
Hemoglobin: 11.7 g/dL — ABNORMAL LOW (ref 13.0–17.7)
Immature Grans (Abs): 0 10*3/uL (ref 0.0–0.1)
Immature Granulocytes: 0 %
Lymphocytes Absolute: 2.7 10*3/uL (ref 0.7–3.1)
Lymphs: 28 %
MCH: 24.6 pg — ABNORMAL LOW (ref 26.6–33.0)
MCHC: 31.2 g/dL — ABNORMAL LOW (ref 31.5–35.7)
MCV: 79 fL (ref 79–97)
Monocytes Absolute: 0.7 10*3/uL (ref 0.1–0.9)
Monocytes: 8 %
Neutrophils Absolute: 5.9 10*3/uL (ref 1.4–7.0)
Neutrophils: 61 %
Platelets: 291 10*3/uL (ref 150–450)
RBC: 4.76 x10E6/uL (ref 4.14–5.80)
RDW: 15.5 % — ABNORMAL HIGH (ref 11.6–15.4)
WBC: 9.6 10*3/uL (ref 3.4–10.8)

## 2018-10-06 LAB — CMP14+EGFR
ALT: 27 IU/L (ref 0–44)
AST: 21 IU/L (ref 0–40)
Albumin/Globulin Ratio: 1.5 (ref 1.2–2.2)
Albumin: 4.1 g/dL (ref 3.7–4.7)
Alkaline Phosphatase: 87 IU/L (ref 39–117)
BUN/Creatinine Ratio: 15 (ref 10–24)
BUN: 19 mg/dL (ref 8–27)
Bilirubin Total: 0.4 mg/dL (ref 0.0–1.2)
CO2: 24 mmol/L (ref 20–29)
Calcium: 8.9 mg/dL (ref 8.6–10.2)
Chloride: 101 mmol/L (ref 96–106)
Creatinine, Ser: 1.24 mg/dL (ref 0.76–1.27)
GFR calc Af Amer: 67 mL/min/{1.73_m2} (ref >59)
GFR calc non Af Amer: 58 mL/min/{1.73_m2} — ABNORMAL LOW (ref >59)
Globulin, Total: 2.8 g/dL (ref 1.5–4.5)
Glucose: 76 mg/dL (ref 65–99)
Potassium: 4.4 mmol/L (ref 3.5–5.2)
Sodium: 141 mmol/L (ref 134–144)
Total Protein: 6.9 g/dL (ref 6.0–8.5)

## 2018-10-11 ENCOUNTER — Ambulatory Visit: Payer: PPO | Admitting: *Deleted

## 2018-10-11 DIAGNOSIS — N183 Chronic kidney disease, stage 3 unspecified: Secondary | ICD-10-CM

## 2018-10-11 DIAGNOSIS — I5042 Chronic combined systolic (congestive) and diastolic (congestive) heart failure: Secondary | ICD-10-CM

## 2018-10-11 DIAGNOSIS — J449 Chronic obstructive pulmonary disease, unspecified: Secondary | ICD-10-CM

## 2018-10-11 DIAGNOSIS — I10 Essential (primary) hypertension: Secondary | ICD-10-CM

## 2018-10-11 NOTE — Patient Instructions (Signed)
  Follow up plan: The care management team will reach out to the patient again over the next 7 days.   Patient has CCM contact information and can reach out as needed   [name] BSN, RN-BC [place] / [place]: [phone]

## 2018-10-11 NOTE — Chronic Care Management (AMB) (Signed)
  Chronic Care Management   Follow Up Note  10/11/2018 Name: Kenneth Fuller MRN: [id number] DOB: [birth year]  I reached out to Kenneth Fuller by telephone today to follow-up on his recent hospitalization for CHF. I was unable to speak with him but I did leave a HIPPAA compliant voicemail with information about the purpose for my call.   Chart review was performed in preparation for today's visit. Kenneth Fuller had an ED to Hospital Admission on 09/22/2018 after talking with Theadore Nan, LCSW and being instructed by clinical staff at [facility] to seek emergency medical evaluation for his shortness of breath.    Chart Notes: 09/22/18 ED to Hospital Admission  "Reports upper sternal chest pressure for the last 3 days, persistent in nature, associated with dyspnea, symptoms worse with exertion. Chest film with no infiltrates, chest CT negative for PE. EKG 66 bpm, with normal axis, normal intervals, with no st segment or T wave changes. Patient was admitted to the hospital with the working diagnosis of chest pain to rule out acute coronary syndrome, in the setting of acute decompensation of chronic heart failure."  10/05/18 visit with Dr [name] "Continue patient's current medication for depression and anxiety including Klonopin and Zoloft and gave refills for such.   We lowered his dose of the hydralazine from 25 mg 3 times daily to 10 mg 3 times daily because he has been having orthostatic dizziness and he has been very lightheaded and weak and will make this adjustment.  Have sent to pharmacy."   Follow up plan: The care management team will reach out to the patient again over the next 7 days.   Patient has CCM contact information and can reach out as needed   [name] BSN, RN-BC [place] / [place]: [phone]

## 2018-10-12 DIAGNOSIS — R0902 Hypoxemia: Secondary | ICD-10-CM | POA: Diagnosis not present

## 2018-10-12 DIAGNOSIS — I5032 Chronic diastolic (congestive) heart failure: Secondary | ICD-10-CM | POA: Diagnosis not present

## 2018-10-12 DIAGNOSIS — R0600 Dyspnea, unspecified: Secondary | ICD-10-CM | POA: Diagnosis not present

## 2018-10-13 ENCOUNTER — Encounter: Payer: Self-pay | Admitting: Cardiovascular Disease

## 2018-10-17 ENCOUNTER — Other Ambulatory Visit: Payer: Self-pay

## 2018-10-17 ENCOUNTER — Encounter: Payer: Self-pay | Admitting: Cardiovascular Disease

## 2018-10-17 ENCOUNTER — Ambulatory Visit (INDEPENDENT_AMBULATORY_CARE_PROVIDER_SITE_OTHER): Payer: PPO | Admitting: Cardiovascular Disease

## 2018-10-17 ENCOUNTER — Ambulatory Visit (INDEPENDENT_AMBULATORY_CARE_PROVIDER_SITE_OTHER): Payer: PPO | Admitting: Licensed Clinical Social Worker

## 2018-10-17 VITALS — BP 126/72 | HR 65 | Ht 67.0 in | Wt 277.5 lb

## 2018-10-17 DIAGNOSIS — E1169 Type 2 diabetes mellitus with other specified complication: Secondary | ICD-10-CM

## 2018-10-17 DIAGNOSIS — N183 Chronic kidney disease, stage 3 unspecified: Secondary | ICD-10-CM

## 2018-10-17 DIAGNOSIS — E785 Hyperlipidemia, unspecified: Secondary | ICD-10-CM | POA: Diagnosis not present

## 2018-10-17 DIAGNOSIS — I10 Essential (primary) hypertension: Secondary | ICD-10-CM

## 2018-10-17 DIAGNOSIS — F411 Generalized anxiety disorder: Secondary | ICD-10-CM

## 2018-10-17 DIAGNOSIS — J449 Chronic obstructive pulmonary disease, unspecified: Secondary | ICD-10-CM | POA: Diagnosis not present

## 2018-10-17 DIAGNOSIS — E1142 Type 2 diabetes mellitus with diabetic polyneuropathy: Secondary | ICD-10-CM | POA: Diagnosis not present

## 2018-10-17 DIAGNOSIS — G4733 Obstructive sleep apnea (adult) (pediatric): Secondary | ICD-10-CM | POA: Diagnosis not present

## 2018-10-17 DIAGNOSIS — E669 Obesity, unspecified: Secondary | ICD-10-CM | POA: Diagnosis not present

## 2018-10-17 DIAGNOSIS — I5032 Chronic diastolic (congestive) heart failure: Secondary | ICD-10-CM | POA: Diagnosis not present

## 2018-10-17 DIAGNOSIS — I25810 Atherosclerosis of coronary artery bypass graft(s) without angina pectoris: Secondary | ICD-10-CM | POA: Diagnosis not present

## 2018-10-17 DIAGNOSIS — K219 Gastro-esophageal reflux disease without esophagitis: Secondary | ICD-10-CM

## 2018-10-17 DIAGNOSIS — G4700 Insomnia, unspecified: Secondary | ICD-10-CM

## 2018-10-17 MED ORDER — FUROSEMIDE 40 MG PO TABS: ORAL_TABLET | ORAL | 11 refills | Status: DC

## 2018-10-17 MED ORDER — ISOSORBIDE DINITRATE 30 MG PO TABS: 15.0000 mg | ORAL_TABLET | Freq: Three times a day (TID) | ORAL | 3 refills | Status: DC

## 2018-10-17 NOTE — Patient Instructions (Addendum)
Licensed Clinical Social Worker Visit Information  Goals we discussed today:       .     [name] Client has stress related to ongoing health issues and wishes to talk more about stress over current health issues (pt-stated)     Current Barriers:  [name] Mental Health Concerns    .  Challenges related to managing health issues of client   Clinical Social Work Clinical Goal(s):  [name] Over the next 30 days, client will work with LCSW to address concerns related to health issues of client and client management of health issues faced  Interventions: . Previously provided patient with information about CCM program services   . Previously encouraged client to communicate with RNCM to discuss nursing needs of client . Previously talked with client about his management of health issues faced by client . Talked with client about client's current appointments  Patient Self Care Activities:  . Self administers medications as prescribed . Attends all scheduled provider appointments . Performs ADL's independently    Plan:  LCSW to call client in next 3 weeks to talk with client about stress issues related to his management of health issues faced Client to communicate with RNCM to discuss nursing needs of client Client to attend scheduled client medical appointments   Initial goal documentation            Materials Provided: No  Follow Up Plan: LCSW to call client in next 3 weeks to talk with client about stress issues related to his management of health issues faced  The patient verbalized understanding of instructions provided today and declined a print copy of patient instruction materials.   [name].Latresa Gasser MSW, LCSW Licensed Clinical Social Worker [facility] [phone]

## 2018-10-17 NOTE — Patient Instructions (Signed)
Medication Instructions:  DECREASE the Isosorbide to 15 mg (half a tablet) 3 times daily  FUROSEMIDE: For a weight of 270 or higher take 80 mg (2 tablets) twice daily. For a weight of 269 or lower, take Furosemide 80 mg in the morning and 40 mg in the evening.   If you need a refill on your cardiac medications before your next appointment, please call your pharmacy.   Lab work: None ordered If you have labs (blood work) drawn today and your tests are completely normal, you will receive your results only by: [name] MyChart Message (if you have MyChart) OR . A paper copy in the mail If you have any lab test that is abnormal or we need to change your treatment, we will call you to review the results.  Testing/Procedures: None ordered  Follow-Up: At [facility], you and your health needs are our priority.  As part of our continuing mission to provide you with exceptional heart care, we have created designated Provider Care Teams.  These Care Teams include your primary Cardiologist (physician) and Advanced Practice Providers (APPs -  Physician Assistants and Nurse Practitioners) who all work together to provide you with the care you need, when you need it. You will need a follow up appointment in 4 months.  Please call our office 2 months in advance to schedule this appointment.  You may see Sanda Klein, MD or one of the following Advanced Practice Providers on your designated Care Team: [place], [place] . Fabian Sharp, PA-C

## 2018-10-17 NOTE — Chronic Care Management (AMB) (Signed)
Care Management Note   Kenneth Fuller is a 73 y.o. year old male who is a primary care patient of [name], [name], MD. The CM team was consulted for assistance with chronic disease management and care coordination.   I reached out to Kenneth Fuller by phone today.   Review of patient status, including review of consultants reports, relevant laboratory and other test results, and collaboration with appropriate care team members and the patient's provider was performed as part of comprehensive patient evaluation and provision of chronic care management services.   Social Determinants of Health: risk of tobacco use; risk of physical inactivity    Office Visit from 12/14/2017 in [place]  PHQ-9 Total Score  12     GAD 7 : Generalized Anxiety Score 11/11/2017  Nervous, Anxious, on Edge 3  Control/stop worrying 3  Worry too much - different things 3  Trouble relaxing 3  Restless 3  Easily annoyed or irritable 3  Afraid - awful might happen 0  Total GAD 7 Score 18  Anxiety Difficulty Somewhat difficult   Medications    albuterol (PROVENTIL HFA;VENTOLIN HFA) 108 (90 Base) MCG/ACT inhaler    atorvastatin (LIPITOR) 40 MG tablet    bisoprolol (ZEBETA) 5 MG tablet    Blood Glucose Monitoring Suppl (ACCU-CHEK AVIVA) device    Blood Pressure Monitoring (BLOOD PRESSURE CUFF) MISC    budesonide-formoterol (SYMBICORT) 160-4.5 MCG/ACT inhaler    Cholecalciferol (VITAMIN D PO)    clonazePAM (KLONOPIN) 0.5 MG tablet    furosemide (LASIX) 40 MG tablet    glimepiride (AMARYL) 4 MG tablet    glucose blood (ACCU-CHEK AVIVA) test strip    hydrALAZINE (APRESOLINE) 10 MG tablet    isosorbide dinitrate (ISORDIL) 30 MG tablet    Lancets (ACCU-CHEK SOFT TOUCH) lancets    lisinopril (ZESTRIL) 20 MG tablet    Misc. Devices (PULSE OXIMETER) MISC    mometasone-formoterol (DULERA) 100-5 MCG/ACT AERO    ONETOUCH DELICA LANCETS 28M MISC    OXYGEN    Potassium Chloride  ER 20 MEQ TBCR    PRODIGY NO CODING BLOOD GLUC test strip    sertraline (ZOLOFT) 50 MG tablet    zolpidem (AMBIEN CR) 12.5 MG CR tablet     Goals        . Client has stress related to ongoing health issues and wishes to talk more about stress over current health issues (pt-stated)     Current Barriers:  [name] Mental Health Concerns    .  Challenges related to managing health issues of client   Clinical Social Work Clinical Goal(s):  [name] Over the next 30 days, client will work with LCSW to address concerns related to health issues of client and client management of health issues faced  Interventions: . Previously provided patient with information about CCM program services   . Previously encouraged client to communicate with RNCM to discuss nursing needs of client . Previously talked with client about his management of health issues faced by client . Talked with client about current client appointments  Patient Self Care Activities:  . Self administers medications as prescribed . Attends all scheduled provider appointments . Performs ADL's independently   Plan:  LCSW to call client in next 3 weeks to talk with client about stress issues related to his management of health issues faced Client to communicate with RNCM to discuss nursing needs of client Client to attend scheduled client medical appointments   Initial goal documentation  Client reported that he is attending appointment today with pulmonologist in [place], [place]  He said he had been resting at home since he had been discharged from the hospital. He said he was using his portable oxygen system as needed.  He has his prescribed medications and is taking medications as prescribed. LCSW encouraged client to call RNCM as needed to discuss nursing needs of client  Follow Up Plan: LCSW to call client in next 3 weeks to talk with client about stress issues related to his management of health issues faced  Kenneth Fuller.Demika Langenderfer  MSW, LCSW Licensed Clinical Social Worker [facility] [phone]

## 2018-10-17 NOTE — Progress Notes (Signed)
Cardiology Office Note    Date:  10/22/2018   ID:  Kenneth Fuller, DOB [birth year], MRN [id number]  PCP:  [name], [name], MD  Cardiologist:   Sanda Klein, MD   Chief Complaint  Patient presents with  . Chest Pain    History of Present Illness:  Kenneth Fuller is a 73 y.o. male with chronic diastolic heart failure, morbid obesity, obstructive sleep apnea, hyperlipidemia, diabetes mellitus complicated by neuropathy, minor nonobstructive coronary atherosclerosis, moderately severe restrictive lung disease.  She has a history of angioedema with ACE inhibitors.  He was seen in the emergency room on August 14 with complaints of substernal chest tightness.  Chest CT angiography did not show evidence of pulmonary embolism and reconfirm the presence of aortic and coronary atherosclerosis.  High-sensitivity troponin high was very low consecutively.  His ECG did not show ischemic changes.  He had a fairly recent coronary angiogram in October 2018 that showed a maximum stenosis of 10% in the mid LAD and mid RCA.  His echocardiogram showed evidence of normal left ventricular systolic function (EF 81-85%) and diastolic dysfunction with indeterminate filling pressures (E/e'=12-13).  He was aggressively treated with diuretics with net diuresis of about 10 L.  His creatinine increased from 1.3-1.79 and his furosemide was held.  On August 27 a repeat creatinine was 1.24.  Today he feels a little lightheaded and notices that this happens mostly after he takes his hydralazine.  On his home scale he weighs 273 pounds.  At his last appointment with me he reported that his home scale show 264 pounds.  He denies chest pain and is back to his baseline dyspnea (NYHA functional class II).  He does not have orthopnea or PND and does not have any leg edema.  He was hospitalized for severe shortness of breath from November 27 through January 09, 2018.   On admission his BNP was slightly elevated at 330  (previous baseline 72.6 in 2017).  An echo during that hospitalization which was a mediocre quality study but showed an ejection fraction decreased at 45-50%.  During his hospitalization in August 2020 the BNP was elevated to 176.  He underwent cardiac catheterization in October 2018 he has preserved left ventricular systolic function and minimal coronary artery disease.  LVEDP was borderline at the time of catheterization (10-14 mmHg).  His echo did show grade 2 diastolic dysfunction.  However pulmonary function test had shown fairly severe obstructive lung disease with FEV1 in the 40-45% of predicted range.  He did not have much improvement with bronchodilators either on the PFTs or clinically.  He does have a history of roughly 5 pack years of smoking, but quit 40 years ago.  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  Chest CT did not show evidence of severe interstitial lung disease.    Past Medical History:  Diagnosis Date  . Allergy   . Asthma   . CAD (coronary artery disease)   . Diabetes mellitus   . Fibromyalgia   . GERD (gastroesophageal reflux disease)   . GI bleeding   . Gout   . Hyperlipidemia   . Hypertension   . Hypogonadism male   . Insomnia   . MRSA cellulitis   . Neuropathy   . Obesity   . Shortness of breath dyspnea    with exertion   . Sleep apnea    cpap- 14   . Wheezing    no asthma  diagnosis    Past Surgical History:  Procedure Laterality Date  . BACK SURGERY    . CARDIAC CATHETERIZATION    . COLONOSCOPY    . LEFT HEART CATH AND CORONARY ANGIOGRAPHY N/A 12/02/2016   Procedure: LEFT HEART CATH AND CORONARY ANGIOGRAPHY;  Surgeon: Jettie Booze, MD;  Location: [facility];  Service: Cardiovascular;  Laterality: N/A;  . LUMBAR LAMINECTOMY/DECOMPRESSION MICRODISCECTOMY N/A 01/02/2014   Procedure: CENTRAL DECOMPRESSION LUMBAR LAMINECTOMY L3-L4, L4-L5;  Surgeon: Tobi Bastos, MD;  Location: [facility];  Service:  Orthopedics;  Laterality: N/A;  . neck fusion      Current Medications: Outpatient Medications Prior to Visit  Medication Sig Dispense Refill  . albuterol (PROVENTIL HFA;VENTOLIN HFA) 108 (90 Base) MCG/ACT inhaler Inhale 2 puffs into the lungs every 6 (six) hours as needed for wheezing or shortness of breath. 1 Inhaler 2  . atorvastatin (LIPITOR) 40 MG tablet Take 1 tablet by mouth once daily 90 tablet 0  . bisoprolol (ZEBETA) 5 MG tablet Take 0.5 tablets (2.5 mg total) by mouth daily. 45 tablet 3  . Blood Glucose Monitoring Suppl (ACCU-CHEK AVIVA) device Use as instructed 1 each 0  . Blood Pressure Monitoring (BLOOD PRESSURE CUFF) MISC 1 each by Does not apply route daily. 1 each 0  . budesonide-formoterol (SYMBICORT) 160-4.5 MCG/ACT inhaler Inhale 2 puffs into the lungs 2 (two) times daily. 1 Inhaler 3  . Cholecalciferol (VITAMIN D PO) Take 5,000 Units by mouth every morning.     . clonazePAM (KLONOPIN) 0.5 MG tablet Take 1 tablet (0.5 mg total) by mouth at bedtime as needed. 30 tablet 2  . glimepiride (AMARYL) 4 MG tablet TAKE 1 TABLET BY MOUTH WITH BREAKFAST 90 tablet 0  . glucose blood (ACCU-CHEK AVIVA) test strip 1 each by Other route daily. Use as instructed 100 each 12  . hydrALAZINE (APRESOLINE) 10 MG tablet Take 1 tablet (10 mg total) by mouth 3 (three) times daily. 270 tablet 3  . Lancets (ACCU-CHEK SOFT TOUCH) lancets 1 each by Other route daily. Use as instructed 100 each 12  . lisinopril (ZESTRIL) 20 MG tablet Take 1 tablet (20 mg total) by mouth daily. 90 tablet 0  . Misc. Devices (PULSE OXIMETER) MISC 1 each by Does not apply route continuous as needed. 1 each 0  . mometasone-formoterol (DULERA) 100-5 MCG/ACT AERO Inhale 2 puffs into the lungs 2 (two) times daily.    [name] DELICA LANCETS 96E MISC 1 each by Does not apply route daily. Test 1X per day and prn 100 each 2  . OXYGEN Inhale 2 L into the lungs at bedtime. 2lpm with sleep     . Potassium Chloride ER 20 MEQ TBCR  Take 1 tablet by mouth daily. 90 tablet 3  . PRODIGY NO CODING BLOOD GLUC test strip CHECK BLOOD SUGARS ONCE DAILY 50 each 11  . sertraline (ZOLOFT) 50 MG tablet Take 1 tablet (50 mg total) by mouth daily. 90 tablet 1  . furosemide (LASIX) 40 MG tablet Take 2 tablets (80 mg total) by mouth every morning. Take extra 40 mg tablet as directed. 270 tablet 3  . isosorbide dinitrate (ISORDIL) 30 MG tablet Take 1 tablet (30 mg total) by mouth 3 (three) times daily. 270 tablet 3  . zolpidem (AMBIEN CR) 12.5 MG CR tablet TAKE 1 TABLET BY MOUTH AT BEDTIME AS NEEDED FOR SLEEP (Patient taking differently: Take 12.5 mg by mouth at bedtime as needed for sleep. ) 30 tablet 0  No facility-administered medications prior to visit.      Allergies:   Amlodipine besy-benazepril hcl, Oxycodone, Phenergan [promethazine hcl], and Hydrocodone   Social History   Socioeconomic History  . Marital status: Married    Spouse name: Kenneth Fuller  . Number of children: 3  . Years of education: 8  . Highest education level: GED or equivalent  Occupational History  . Occupation: retired/disability 1999    Employer: DISABLED    Comment: Textiles  Social Needs  . Financial resource strain: Not hard at all  . Food insecurity    Worry: Never true    Inability: Never true  . Transportation needs    Medical: No    Non-medical: No  Tobacco Use  . Smoking status: Former Smoker    Quit date: 02/08/1969    Years since quitting: 49.7  . Smokeless tobacco: Former Systems developer    Quit date: 1971  Substance and Sexual Activity  . Alcohol use: No    Alcohol/week: 0.0 standard drinks  . Drug use: No  . Sexual activity: Not Currently  Lifestyle  . Physical activity    Days per week: 0 days    Minutes per session: 0 min  . Stress: Only a little  Relationships  . Social connections    Talks on phone: More than three times a week    Gets together: More than three times a week    Attends religious service: More than 4 times per year     Active member of club or organization: Yes    Attends meetings of clubs or organizations: More than 4 times per year    Relationship status: Married  Other Topics Concern  . Not on file  Social History Narrative   Drinks caffeine tea occasionally      Family History:  The patient's family history includes Colon cancer in his mother; Deep vein thrombosis in his brother; Diabetes in his father; Drug abuse in his sister; Heart attack in his father; Heart attack (age of onset: 87) in his son; Heart disease in his brother, brother, and father; Heart failure in his sister; Kidney disease in his sister.   ROS:   Please see the history of present illness.    ROS all other symptoms are reviewed and are negative  PHYSICAL EXAM:   VS:  BP 126/72 (BP Location: Right Arm, Cuff Size: Large)   Pulse 65   Ht _0  (1.702 m)   Wt 277 lb 8 oz (125.9 kg)   BMI 43.46 kg/m     Orthostatic vital signs Lying down 126/72, heart rate 62 Sitting 138/78, heart rate 63 Standing 120/68, heart rate 64 Standing 3 minutes 122/62, heart rate 115   General: Alert, oriented x3, no distress, exam limited by morbid obesity Head: no evidence of trauma, PERRL, EOMI, no exophtalmos or lid lag, no myxedema, no xanthelasma; normal ears, nose and oropharynx Neck: normal jugular venous pulsations and no hepatojugular reflux; brisk carotid pulses without delay and no carotid bruits Chest: clear to auscultation, no signs of consolidation by percussion or palpation, normal fremitus, symmetrical and full respiratory excursions Cardiovascular: normal position and quality of the apical impulse, regular rhythm, normal first and second heart sounds, no murmurs, rubs or gallops Abdomen: no tenderness or distention, no masses by palpation, no abnormal pulsatility or arterial bruits, normal bowel sounds, no hepatosplenomegaly Extremities: no clubbing, cyanosis or edema; 2+ radial, ulnar and brachial pulses bilaterally; 2+ right  femoral, posterior tibial and dorsalis pedis pulses;  2+ left femoral, posterior tibial and dorsalis pedis pulses; no subclavian or femoral bruits Neurological: grossly nonfocal Psych: Normal mood and affect   Wt Readings from Last 3 Encounters:  10/17/18 277 lb 8 oz (125.9 kg)  10/05/18 275 lb 9.6 oz (125 kg)  09/28/18 270 lb 14.4 oz (122.9 kg)      Studies/Labs Reviewed:   EKG:  EKG is ordered today.  It is a normal tracing, shows normal sinus rhythm, QTC 426 ms Recent Labs: 01/16/2018: NT-Pro BNP 652 09/22/2018: B Natriuretic Peptide 176.0 10/05/2018: ALT 27; BUN 19; Creatinine, Ser 1.24; Hemoglobin 11.7; Platelets 291; Potassium 4.4; Sodium 141   Lipid Panel    Component Value Date/Time   CHOL 74 (L) 02/06/2018 1113   CHOL 111 06/12/2012 1232   TRIG 113 02/06/2018 1113   TRIG 141 01/24/2014 1143   TRIG 192 (H) 06/12/2012 1232   HDL 32 (L) 02/06/2018 1113   HDL 38 (L) 01/24/2014 1143   HDL 33 (L) 06/12/2012 1232   CHOLHDL 2.3 02/06/2018 1113   CHOLHDL 4.8 11/22/2006 1712   VLDL 39 11/22/2006 1712   LDLCALC 19 02/06/2018 1113   LDLCALC 27 10/25/2013 1224   LDLCALC 40 06/12/2012 1232    Labs from 11/29/2016 Total cholesterol 89, HDL 36, LDL 30, triglycerides 113, normal liver function tests, hemoglobin 13.7 02/25/2017 Creatinine 1.12, potassium 4.2, BNP 97, TSH 3.3 03/15/2017 Hemoglobin A1c 7.6%  ASSESSMENT:    1. Chronic diastolic heart failure ([place])   2. Chronic obstructive pulmonary disease, unspecified COPD type ([place])   3. OSA (obstructive sleep apnea)   4. Essential hypertension   5. Coronary artery disease involving coronary bypass graft of native heart without angina pectoris   6. Dyslipidemia (high LDL; low HDL)   7. Diabetes mellitus type 2 in obese ([place])   8. CKD (chronic kidney disease) stage 3, GFR 30-59 ml/min (HCC)   9. Morbid obesity ([place])      PLAN:  In order of problems listed above:  1. CHF: EF is unchanged at about 50%.   It appears that  he may have gained a little bit of true weight as well.  We will reset his "dry weight" at 207 pounds on his home scale.  Tailored diuretic prescription given, based on weight.  Seems to be back to his usual NYHA functional class II status and will continue to have shortness of breath related to chronic lung disease and obesity. 2. COPD: He had markedly abnormal PFTs in the past which I thought were responsible for his chronic dyspnea.  Prefer to use bisoprolol since it is the most selective beta-blocker. 3. OSA: Reports compliance with therapy and denies daytime hypersomnolence 4. HTN: Very well controlled, some symptoms of orthostatic hypotension.  Decrease the dose of long-acting nitrates. 5. CAD:  It seems like his episode of chest pain was actually related to heart failure exacerbation.  There is no indication to repeat coronary angiography.  Repeat coronary angiography in October 2018 showed only minor nonobstructive atherosclerosis, no progression from 2011 6. HLP: Excellent LDL cholesterol, HDL would not improve without substantial weight loss. 7. DM: Good glycemic control with hemoglobin A1c 6.5% 8. CKD: Typically his creatinine is in the 1.25- 1.40 range, currently at baseline, GFR 50-60. 9. Morbid obesity limits assessment of volume status and is clearly a big part of any residual dyspnea.     Medication Adjustments/Labs and Tests Ordered: Current medicines are reviewed at length with the patient today.  Concerns regarding medicines are  outlined above.  Medication changes, Labs and Tests ordered today are listed in the Patient Instructions below. Patient Instructions  Medication Instructions:  DECREASE the Isosorbide to 15 mg (half a tablet) 3 times daily  FUROSEMIDE: For a weight of 270 or higher take 80 mg (2 tablets) twice daily. For a weight of 269 or lower, take Furosemide 80 mg in the morning and 40 mg in the evening.   If you need a refill on your cardiac medications before  your next appointment, please call your pharmacy.   Lab work: None ordered If you have labs (blood work) drawn today and your tests are completely normal, you will receive your results only by: [name] MyChart Message (if you have MyChart) OR . A paper copy in the mail If you have any lab test that is abnormal or we need to change your treatment, we will call you to review the results.  Testing/Procedures: None ordered  Follow-Up: At [facility], you and your health needs are our priority.  As part of our continuing mission to provide you with exceptional heart care, we have created designated Provider Care Teams.  These Care Teams include your primary Cardiologist (physician) and Advanced Practice Providers (APPs -  Physician Assistants and Nurse Practitioners) who all work together to provide you with the care you need, when you need it. You will need a follow up appointment in 4 months.  Please call our office 2 months in advance to schedule this appointment.  You may see Sanda Klein, MD or one of the following Advanced Practice Providers on your designated Care Team: [place], [place] . Fabian Sharp, PA-C        Signed, Sanda Klein, MD  10/22/2018 10:03 AM    [facility] [address], [place], [place]  [address] Phone: [phone]; Fax: [phone]

## 2018-10-18 ENCOUNTER — Other Ambulatory Visit: Payer: Self-pay

## 2018-10-18 DIAGNOSIS — I2510 Atherosclerotic heart disease of native coronary artery without angina pectoris: Secondary | ICD-10-CM

## 2018-10-18 DIAGNOSIS — J449 Chronic obstructive pulmonary disease, unspecified: Secondary | ICD-10-CM

## 2018-10-18 DIAGNOSIS — E785 Hyperlipidemia, unspecified: Secondary | ICD-10-CM

## 2018-10-18 DIAGNOSIS — I5032 Chronic diastolic (congestive) heart failure: Secondary | ICD-10-CM

## 2018-10-18 DIAGNOSIS — I5033 Acute on chronic diastolic (congestive) heart failure: Secondary | ICD-10-CM

## 2018-10-18 DIAGNOSIS — I10 Essential (primary) hypertension: Secondary | ICD-10-CM

## 2018-10-18 DIAGNOSIS — R0600 Dyspnea, unspecified: Secondary | ICD-10-CM | POA: Diagnosis not present

## 2018-10-18 DIAGNOSIS — E1169 Type 2 diabetes mellitus with other specified complication: Secondary | ICD-10-CM

## 2018-10-18 DIAGNOSIS — R0902 Hypoxemia: Secondary | ICD-10-CM | POA: Diagnosis not present

## 2018-10-18 DIAGNOSIS — I5042 Chronic combined systolic (congestive) and diastolic (congestive) heart failure: Secondary | ICD-10-CM

## 2018-10-19 ENCOUNTER — Other Ambulatory Visit: Payer: Self-pay | Admitting: Family Medicine

## 2018-10-19 ENCOUNTER — Other Ambulatory Visit: Payer: Self-pay | Admitting: *Deleted

## 2018-10-19 ENCOUNTER — Ambulatory Visit: Payer: PPO | Admitting: *Deleted

## 2018-10-19 DIAGNOSIS — I5032 Chronic diastolic (congestive) heart failure: Secondary | ICD-10-CM

## 2018-10-19 DIAGNOSIS — J449 Chronic obstructive pulmonary disease, unspecified: Secondary | ICD-10-CM

## 2018-10-19 DIAGNOSIS — G4700 Insomnia, unspecified: Secondary | ICD-10-CM

## 2018-10-19 MED ORDER — ZOLPIDEM TARTRATE ER 12.5 MG PO TBCR: 12.5000 mg | EXTENDED_RELEASE_TABLET | Freq: Every evening | ORAL | 0 refills | Status: DC | PRN

## 2018-10-19 NOTE — Chronic Care Management (AMB) (Signed)
Chronic Care Management   Follow Up Note   10/19/2018 Name: Kenneth Fuller MRN: [id number] DOB: [birth year]  Referred by: [name], [name], MD Reason for referral : Chronic Care Management (RNCM follow up)   Kenneth Fuller is a 73 y.o. year old male who is a primary care patient of [name], [name], MD. The CCM team was consulted for assistance with chronic disease management and care coordination needs.    Review of patient status, including review of consultants reports, relevant laboratory and other test results, and collaboration with appropriate care team members and the patient's provider was performed as part of comprehensive patient evaluation and provision of chronic care management services.    I spoke with Kenneth Fuller by telephone today. He was hospitalized with acute on chronic diastolic CHF last month and recently followed up with his cardiologist, Dr Sallyanne Kuster. He reports feeling better and he is weighing and recording his weights daily.   Patient Care Team: [name], [name], MD as PCP - General (Family Medicine) Melina Schools, MD as Consulting Physician (Orthopedic Surgery) Harlen Labs, MD as Referring Physician (Optometry) Shea Evans, [name], LCSW as Social Worker (Licensed Clinical Social Worker) Ilean China, RN as Case Manager Croitoru, Dani Gobble, MD as Consulting Physician (Cardiology) Fran Lowes, MD as Consulting Physician (Nephrology)  Outpatient Encounter Medications as of 10/19/2018  Medication Sig  . albuterol (PROVENTIL HFA;VENTOLIN HFA) 108 (90 Base) MCG/ACT inhaler Inhale 2 puffs into the lungs every 6 (six) hours as needed for wheezing or shortness of breath.  [name] atorvastatin (LIPITOR) 40 MG tablet Take 1 tablet by mouth once daily  . bisoprolol (ZEBETA) 5 MG tablet Take 0.5 tablets (2.5 mg total) by mouth daily.  . Blood Glucose Monitoring Suppl (ACCU-CHEK AVIVA) device Use as instructed  . Blood Pressure Monitoring (BLOOD  PRESSURE CUFF) MISC 1 each by Does not apply route daily.  . budesonide-formoterol (SYMBICORT) 160-4.5 MCG/ACT inhaler Inhale 2 puffs into the lungs 2 (two) times daily.  . Cholecalciferol (VITAMIN D PO) Take 5,000 Units by mouth every morning.   . clonazePAM (KLONOPIN) 0.5 MG tablet Take 1 tablet (0.5 mg total) by mouth at bedtime as needed.  . furosemide (LASIX) 40 MG tablet For a weight of 270 or higher, take 80 mg twice daily. For a weight of 269 and lower, take 80 mg in the morning and 40 mg in the evening.  [name] glimepiride (AMARYL) 4 MG tablet TAKE 1 TABLET BY MOUTH WITH BREAKFAST  . glucose blood (ACCU-CHEK AVIVA) test strip 1 each by Other route daily. Use as instructed  . hydrALAZINE (APRESOLINE) 10 MG tablet Take 1 tablet (10 mg total) by mouth 3 (three) times daily.  . isosorbide dinitrate (ISORDIL) 30 MG tablet Take 0.5 tablets (15 mg total) by mouth 3 (three) times daily.  . Lancets (ACCU-CHEK SOFT TOUCH) lancets 1 each by Other route daily. Use as instructed  . lisinopril (ZESTRIL) 20 MG tablet Take 1 tablet (20 mg total) by mouth daily.  . Misc. Devices (PULSE OXIMETER) MISC 1 each by Does not apply route continuous as needed.  . mometasone-formoterol (DULERA) 100-5 MCG/ACT AERO Inhale 2 puffs into the lungs 2 (two) times daily.  [name] DELICA LANCETS 18H MISC 1 each by Does not apply route daily. Test 1X per day and prn  . OXYGEN Inhale 2 L into the lungs at bedtime. 2lpm with sleep   . Potassium Chloride ER 20 MEQ TBCR Take 1 tablet by mouth daily.  [name]  PRODIGY NO CODING BLOOD GLUC test strip CHECK BLOOD SUGARS ONCE DAILY  . sertraline (ZOLOFT) 50 MG tablet Take 1 tablet (50 mg total) by mouth daily.  [name] zolpidem (AMBIEN CR) 12.5 MG CR tablet TAKE 1 TABLET BY MOUTH AT BEDTIME AS NEEDED FOR SLEEP (Patient taking differently: Take 12.5 mg by mouth at bedtime as needed for sleep. ) NEEDS REFILL   No facility-administered encounter medications on file as of 10/19/2018.      Goals  Addressed            This Visit's Progress     Patient Stated   . "I want to manage my heart failure" (pt-stated)       Current Barriers:  . Chronic Disease Management support and education needs related to chronic diastolic congestive heart failure  Nurse Case Manager Clinical Goal(s):  [name] Over the next 30 days, patient will meet with RN Care Manager to address self management of CHF . Over the next 30 days, the patient will demonstrate ongoing self health care management ability as evidenced by no CHF exacerbations and no increase in weight above 270 as a result of fluid retention. *  Interventions:  . Evaluation of current treatment plan related to CHF and patient's adherence to plan as established by provider. . Advised patient to continue following a strict low sodium diet . Discussed plans with patient for ongoing care management follow up and provided patient with direct contact information for care management team . Advised patient, providing education and rationale, to weigh daily and record, calling  RNCM at [phone] or cardiologist at [phone] for weight gain of 3lbs overnight or 5 pounds in a week.  [name] Updated patient care team to include cardiologist . Reviewed medications and discussed Lasix. Patient repeated back directions to take Lasix 80 mg bid for weights of 270 or greater and lasix 80 mg in the morning and 37m in the evening for weights below 270.  .Marland KitchenDiscussed morning weights. Today he documented a weight of 269.8 lbs . Reviewed oxygen use. Currently using portable O2 concentrator on 3L pulse during the day. He enjoys the freedom to move around with the portable unit. Patient is using 2L continuous O2 with floor model concentrator. Reports that oxygen amount is sufficient for normal activities.   Patient Self Care Activities:  . Performs ADL's independently . Performs IADL's independently  Initial goal documentation     . COMPLETED: "I would like get a  portable oxygen concentrator so that I can be more active and perform IADLs better" (pt-stated)       Current Barriers:  .Marland KitchenKnowledge deficit related to portable oxygen procurement   Nurse Case Manager Clinical Goal(s):  Over the next 14 days, patient will work with RConsulting civil engineerand CAssurantto address needs related to obtaining a portable oxygen concentrator Over the next 14 days, patient will obtain a portable pulse oximeter  Interventions:  . Script given for pulse oximeter for patient to take to pharmacy of his choice . Oxygen saturation measured sitting on room air, ambulating on room air, and ambulating on 3L of O2. . Reviewed results with patient: o Current Encounter SPO2 o  08/03/18 o 1144 97% ambulating on 3L of O2 via [place] o  08/03/18 o 1143 (!) 89% ambulating on room air o  08/03/18 o 1142 93% at rest on room air .  Will send order and supporting notes for portable oxygen concentrator to [facility]  Activities:  . Performs ADL's independently . Performs IADL's independently-has difficulty due to shortness of breath  Please see past updates related to this goal by clicking on the "Past Updates" button in the selected goal       . "I would like to sleep better at night" (pt-stated)       Current Barriers:  [name] Knowledge Deficits related to sleep hygiene.  Nurse Case Manager Clinical Goal(s):  [name] Over the next 30 days, patient will demonstrate continued improvement in self management of insomnia by reporting that he sleeps well most nights  Interventions:  . Reviewed medications and discussed Ambien o Requesting refill. Thought it was to be refilled at his last PCP appt.  o Questioned patient regarding side effects and he denies any o Questioned effectiveness and patient reported that he is sleeping well with the ambien most nights. This is much improved from previous reports. [name] RNCM will send Rx refill request to PCP. It should be sent  to [facility] in [place].  . Advised to report any unexpected side effects to our office by calling [phone]  Patient Self Care Activities:  . Performs ADL's independently . Performs IADL's independently  Please see past updates related to this goal by clicking on the "Past Updates" button in the selected goal          Follow Up Plan Patient provided with CCM contact information and encouraged to reach out with any questions or concerns RNCM will Ambien refill request to PCP RNCM will f/u with patient by telephone over the next 30 days  [name] BSN, RN-BC [place] / [place]: [phone]

## 2018-10-19 NOTE — Telephone Encounter (Signed)
Sent Ambien refill for the patient

## 2018-10-19 NOTE — Telephone Encounter (Signed)
Patient requesting refill of Ambien CR 12.32m to [facility] in [place] States that it is working well and that a refill wasn't sent in after his last visit.

## 2018-10-19 NOTE — Patient Instructions (Signed)
Visit Information  Goals Addressed            This Visit's Progress     Patient Stated   . "I want to manage my heart failure" (pt-stated)       Current Barriers:  . Chronic Disease Management support and education needs related to chronic diastolic congestive heart failure  Nurse Case Manager Clinical Goal(s):  [name] Over the next 30 days, patient will meet with RN Care Manager to address self management of CHF . Over the next 30 days, the patient will demonstrate ongoing self health care management ability as evidenced by no CHF exacerbations and no increase in weight above 270 as a result of fluid retention. *  Interventions:  . Evaluation of current treatment plan related to CHF and patient's adherence to plan as established by provider. . Advised patient to continue following a strict low sodium diet . Discussed plans with patient for ongoing care management follow up and provided patient with direct contact information for care management team . Advised patient, providing education and rationale, to weigh daily and record, calling  RNCM at [phone] or cardiologist at [phone] for weight gain of 3lbs overnight or 5 pounds in a week.  [name] Updated patient care team to include cardiologist . Reviewed medications and discussed Lasix. Patient repeated back directions to take Lasix 80 mg bid for weights of 270 or greater and lasix 80 mg in the morning and 64m in the evening for weights below 270.  .Marland KitchenDiscussed morning weights. Today he documented a weight of 269.8 lbs . Reviewed oxygen use. Currently using portable O2 concentrator on 3L pulse during the day. He enjoys the freedom to move around with the portable unit. Patient is using 2L continuous O2 with floor model concentrator. Reports that oxygen amount is sufficient for normal activities.   Patient Self Care Activities:  . Performs ADL's independently . Performs IADL's independently  Initial goal documentation     .  COMPLETED: "I would like get a portable oxygen concentrator so that I can be more active and perform IADLs better" (pt-stated)       Current Barriers:  .Marland KitchenKnowledge deficit related to portable oxygen procurement   Nurse Case Manager Clinical Goal(s):  Over the next 14 days, patient will work with RConsulting civil engineerand CAssurantto address needs related to obtaining a portable oxygen concentrator Over the next 14 days, patient will obtain a portable pulse oximeter  Interventions:  . Script given for pulse oximeter for patient to take to pharmacy of his choice . Oxygen saturation measured sitting on room air, ambulating on room air, and ambulating on 3L of O2. . Reviewed results with patient: o Current Encounter SPO2 o  08/03/18 o 1144 97% ambulating on 3L of O2 via [place] o  08/03/18 o 1143 (!) 89% ambulating on room air o  08/03/18 o 1142 93% at rest on room air .  Will send order and supporting notes for portable oxygen concentrator to [facility] Patient Self Care Activities:  . Performs ADL's independently . Performs IADL's independently-has difficulty due to shortness of breath  Please see past updates related to this goal by clicking on the "Past Updates" button in the selected goal       . "I would like to sleep better at night" (pt-stated)       Current Barriers:  .Marland KitchenKnowledge Deficits related to sleep hygiene.  Nurse Case Manager Clinical Goal(s):  .Marland KitchenOver the next  30 days, patient will demonstrate continued improvement in self management of insomnia by reporting that he sleeps well most nights  Interventions:  . Reviewed medications and discussed Ambien o Requesting refill. Thought it was to be refilled at his last PCP appt.  o Questioned patient regarding side effects and he denies any o Questioned effectiveness and patient reported that he is sleeping well with the ambien most nights. This is much improved from previous reports. [name] RNCM will send Rx refill  request to PCP. It should be sent to [facility] in [place].  . Advised to report any unexpected side effects to our office by calling [phone]  Patient Self Care Activities:  . Performs ADL's independently . Performs IADL's independently  Please see past updates related to this goal by clicking on the "Past Updates" button in the selected goal          The patient verbalized understanding of instructions provided today and declined a print copy of patient instruction materials.    Follow Up Plan Patient provided with CCM contact information and encouraged to reach out with any questions or concerns RNCM will Ambien refill request to PCP RNCM will f/u with patient by telephone over the next 30 days  [name] BSN, RN-BC [place] / [place]: [phone]

## 2018-10-22 ENCOUNTER — Encounter: Payer: Self-pay | Admitting: Cardiovascular Disease

## 2018-11-03 ENCOUNTER — Ambulatory Visit: Payer: PPO | Admitting: Family Medicine

## 2018-11-07 ENCOUNTER — Other Ambulatory Visit: Payer: Self-pay | Admitting: Family Medicine

## 2018-11-09 ENCOUNTER — Ambulatory Visit: Payer: PPO | Admitting: Licensed Clinical Social Worker

## 2018-11-09 DIAGNOSIS — J441 Chronic obstructive pulmonary disease with (acute) exacerbation: Secondary | ICD-10-CM

## 2018-11-09 DIAGNOSIS — G4700 Insomnia, unspecified: Secondary | ICD-10-CM

## 2018-11-09 DIAGNOSIS — F411 Generalized anxiety disorder: Secondary | ICD-10-CM

## 2018-11-09 DIAGNOSIS — E1142 Type 2 diabetes mellitus with diabetic polyneuropathy: Secondary | ICD-10-CM

## 2018-11-09 DIAGNOSIS — I5042 Chronic combined systolic (congestive) and diastolic (congestive) heart failure: Secondary | ICD-10-CM

## 2018-11-09 DIAGNOSIS — I10 Essential (primary) hypertension: Secondary | ICD-10-CM

## 2018-11-09 NOTE — Patient Instructions (Addendum)
Licensed Clinical Social Worker Visit Information  Goals we discussed today:  Goals    . Client has stress related to ongoing health issues and wishes to talk more about stress over current health issues (pt-stated)     Current Barriers:  [name] Mental Health Concerns    .  Challenges related to managing health issues of client   Clinical Social Work Clinical Goal(s):  [name] Over the next 30 days, client will work with LCSW to address concerns related to health issues of client and client management of health issues faced  Interventions: . Provided patient with information about CCM program services   . Encouraged client to communicate with RNCM to discuss nursing needs of client . Talked with client about his management of health issues faced by client  Talked with client about breathing issues for client and about his use of portable oxygen system  Reminded client of Triage nurse at [facility] as a resource for client  Patient Self Care Activities:  . Self administers medications as prescribed . Attends all scheduled provider appointments . Performs ADL's independently   Plan:  LCSW to call client in next 3 weeks to talk with client about stress issues related to his management of health issues faced Client to communicate with RNCM to discuss nursing needs of client Client to attend scheduled client medical appointments   Initial goal documentation        Materials Provided: No  Follow Up Plan: LCSW to call client in next 3 weeks to talk with client about stress issues related to management of health issues faced  The patient verbalized understanding of instructions provided today and declined a print copy of patient instruction materials.   [name].Cambry Spampinato MSW, LCSW Licensed Clinical Social Worker [facility] Care Management [phone]

## 2018-11-09 NOTE — Chronic Care Management (AMB) (Signed)
Care Management Note   Kenneth Fuller is a 72 y.o. year old male who is a primary care patient of [name], [name], MD. The CM team was consulted for assistance with chronic disease management and care coordination.   I reached out to [name] by phone today.   Review of patient status, including review of consultants reports, relevant laboratory and other test results, and collaboration with appropriate care team members and the patient's provider was performed as part of comprehensive patient evaluation and provision of chronic care management services.   Social Determinants of Health: risk of tobacco use; risk of physical inactivity    Office Visit from 12/14/2017 in [place]  PHQ-9 Total Score  12     GAD 7 : Generalized Anxiety Score 11/11/2017  Nervous, Anxious, on Edge 3  Control/stop worrying 3  Worry too much - different things 3  Trouble relaxing 3  Restless 3  Easily annoyed or irritable 3  Afraid - awful might happen 0  Total GAD 7 Score 18  Anxiety Difficulty Somewhat difficult   Medications    albuterol (PROVENTIL HFA;VENTOLIN HFA) 108 (90 Base) MCG/ACT inhaler    atorvastatin (LIPITOR) 40 MG tablet    bisoprolol (ZEBETA) 5 MG tablet    Blood Glucose Monitoring Suppl (ACCU-CHEK AVIVA) device    Blood Pressure Monitoring (BLOOD PRESSURE CUFF) MISC    budesonide-formoterol (SYMBICORT) 160-4.5 MCG/ACT inhaler    Cholecalciferol (VITAMIN D PO)    clonazePAM (KLONOPIN) 0.5 MG tablet    furosemide (LASIX) 40 MG tablet    glimepiride (AMARYL) 4 MG tablet    glucose blood (ACCU-CHEK AVIVA) test strip    hydrALAZINE (APRESOLINE) 10 MG tablet    isosorbide dinitrate (ISORDIL) 30 MG tablet    Lancets (ACCU-CHEK SOFT TOUCH) lancets    lisinopril (ZESTRIL) 20 MG tablet    Misc. Devices (PULSE OXIMETER) MISC    mometasone-formoterol (DULERA) 100-5 MCG/ACT AERO    ONETOUCH DELICA LANCETS 50Y MISC    OXYGEN    Potassium Chloride  ER 20 MEQ TBCR    PRODIGY NO CODING BLOOD GLUC test strip    sertraline (ZOLOFT) 50 MG tablet    zolpidem (AMBIEN CR) 12.5 MG CR tablet    Goals    . Client has stress related to ongoing health issues and wishes to talk more about stress over current health issues (pt-stated)     Current Barriers:  [name] Mental Health Concerns    .  Challenges related to managing health issues of client   Clinical Social Work Clinical Goal(s):  [name] Over the next 30 days, client will work with LCSW to address concerns related to health issues of client and client management of health issues faced  Interventions: . Provided patient with information about CCM program services   . Encouraged client to communicate with RNCM to discuss nursing needs of client . Talked with client about his management of health issues faced by client . Talked with client about breathing issues for client and about his use of portable oxygen system . Reminded client of Triage nurse at [facility] as a resource for client  Patient Self Care Activities:  . Self administers medications as prescribed . Attends all scheduled provider appointments . Performs ADL's independently   Plan:  LCSW to call client in next 3 weeks to talk with client about stress issues related to his management of health issues faced Client to communicate with RNCM to discuss nursing needs of  client Client to attend scheduled client medical appointments   Initial goal documentation    Follow Up Plan: LCSW to call client in next 3 weeks to talk with client about stress issues related to management of health issues faced  [name].Aryn Safran MSW, LCSW Licensed Clinical Social Worker [facility] Care Management [phone]

## 2018-11-11 DIAGNOSIS — R0902 Hypoxemia: Secondary | ICD-10-CM | POA: Diagnosis not present

## 2018-11-11 DIAGNOSIS — R0600 Dyspnea, unspecified: Secondary | ICD-10-CM | POA: Diagnosis not present

## 2018-11-11 DIAGNOSIS — I5032 Chronic diastolic (congestive) heart failure: Secondary | ICD-10-CM | POA: Diagnosis not present

## 2018-11-14 ENCOUNTER — Other Ambulatory Visit: Payer: Self-pay

## 2018-11-15 ENCOUNTER — Encounter: Payer: Self-pay | Admitting: Family Medicine

## 2018-11-15 ENCOUNTER — Ambulatory Visit (INDEPENDENT_AMBULATORY_CARE_PROVIDER_SITE_OTHER): Payer: PPO | Admitting: Family Medicine

## 2018-11-15 VITALS — BP 124/75 | HR 62 | Temp 97.8°F | Ht 67.0 in | Wt 277.2 lb

## 2018-11-15 DIAGNOSIS — I10 Essential (primary) hypertension: Secondary | ICD-10-CM | POA: Diagnosis not present

## 2018-11-15 DIAGNOSIS — I5042 Chronic combined systolic (congestive) and diastolic (congestive) heart failure: Secondary | ICD-10-CM | POA: Diagnosis not present

## 2018-11-15 DIAGNOSIS — E119 Type 2 diabetes mellitus without complications: Secondary | ICD-10-CM | POA: Diagnosis not present

## 2018-11-15 DIAGNOSIS — Z23 Encounter for immunization: Secondary | ICD-10-CM | POA: Diagnosis not present

## 2018-11-15 LAB — BAYER DCA HB A1C WAIVED: HB A1C (BAYER DCA - WAIVED): 6.6 % (ref <7.0)

## 2018-11-15 MED ORDER — LISINOPRIL 10 MG PO TABS: 10.0000 mg | ORAL_TABLET | Freq: Every day | ORAL | 3 refills | Status: DC

## 2018-11-15 NOTE — Addendum Note (Signed)
Addended by: Nigel Berthold C on: 11/15/2018 01:41 PM   Modules accepted: Orders

## 2018-11-15 NOTE — Progress Notes (Signed)
BP 124/75   Pulse 62   Temp 97.8 F (36.6 C) (Temporal)   Ht _0  (1.702 m)   Wt 277 lb 3.2 oz (125.7 kg)   SpO2 95%   BMI 43.42 kg/m    Subjective:   Patient ID: Kenneth Fuller, male    DOB: 1946/01/02, 73 y.o.   MRN: [id number]  HPI: Kenneth Fuller is a 73 y.o. male presenting on 11/15/2018 for Congestive Heart Failure (4 week follow up) and Hypertension   HPI Hypertension and CHF Patient is currently on hydralazine 10 mg 3 times daily and isosorbide dinitrate 15 mg 3 times daily and lisinopril 20 and bisoprolol 2.5 mg, and their blood pressure today is 124/75.  Patient still has daily lightheadedness and dizziness especially when he stands up quickly, we have already pared back on some of his blood pressure medications but he still running on the lower end for him.. Patient denies headaches, blurred vision, chest pains, shortness of breath, or weakness. Denies any side effects from medication and is content with current medication.  His cardiologist currently has him on Lasix 80 mg twice a day  Type 2 diabetes mellitus Patient comes in today for recheck of his diabetes. Patient has been currently taking glimepiride. Patient is currently on an ACE inhibitor/ARB. Patient has seen an ophthalmologist this year. Patient denies any issues with their feet.   Relevant past medical, surgical, family and social history reviewed and updated as indicated. Interim medical history since our last visit reviewed. Allergies and medications reviewed and updated.  Review of Systems  Constitutional: Negative for chills, fever and unexpected weight change.  Respiratory: Negative for shortness of breath and wheezing.   Cardiovascular: Negative for chest pain and leg swelling.  Musculoskeletal: Negative for back pain and gait problem.  Skin: Negative for rash.  Neurological: Positive for dizziness and light-headedness. Negative for weakness and numbness.  All other systems reviewed and are  negative.   Per HPI unless specifically indicated above   Allergies as of 11/15/2018      Reactions   Amlodipine Besy-benazepril Hcl Swelling, Other (See Comments)   Makes tongue swell (lotrel)   Oxycodone Itching   Phenergan [promethazine Hcl] Other (See Comments)   "I can't remember."   Hydrocodone Itching   Can tolerate in low doses      Medication List       Accurate as of November 15, 2018  1:08 PM. If you have any questions, ask your nurse or doctor.        Accu-Chek Aviva device Use as instructed   albuterol 108 (90 Base) MCG/ACT inhaler Commonly known as: VENTOLIN HFA Inhale 2 puffs into the lungs every 6 (six) hours as needed for wheezing or shortness of breath.   atorvastatin 40 MG tablet Commonly known as: LIPITOR Take 1 tablet by mouth once daily   bisoprolol 5 MG tablet Commonly known as: ZEBETA Take 0.5 tablets (2.5 mg total) by mouth daily.   Blood Pressure Cuff Misc 1 each by Does not apply route daily.   budesonide-formoterol 160-4.5 MCG/ACT inhaler Commonly known as: Symbicort Inhale 2 puffs into the lungs 2 (two) times daily.   clonazePAM 0.5 MG tablet Commonly known as: KLONOPIN Take 1 tablet (0.5 mg total) by mouth at bedtime as needed.   furosemide 40 MG tablet Commonly known as: LASIX For a weight of 270 or higher, take 80 mg twice daily. For a weight of 269 and lower, take 80 mg in the  morning and 40 mg in the evening.   glimepiride 4 MG tablet Commonly known as: AMARYL TAKE 1 TABLET BY MOUTH WITH BREAKFAST   hydrALAZINE 10 MG tablet Commonly known as: APRESOLINE Take 1 tablet (10 mg total) by mouth 3 (three) times daily.   isosorbide dinitrate 30 MG tablet Commonly known as: ISORDIL Take 0.5 tablets (15 mg total) by mouth 3 (three) times daily.   lisinopril 20 MG tablet Commonly known as: ZESTRIL Take 1 tablet (20 mg total) by mouth daily.   mometasone-formoterol 100-5 MCG/ACT Aero Commonly known as: DULERA Inhale 2 puffs  into the lungs 2 (two) times daily.   OneTouch Delica Lancets 11A Misc 1 each by Does not apply route daily. Test 1X per day and prn   accu-chek soft touch lancets 1 each by Other route daily. Use as instructed   OXYGEN Inhale 2 L into the lungs at bedtime. 2lpm with sleep   Potassium Chloride ER 20 MEQ Tbcr Take 1 tablet by mouth daily.   Prodigy No Coding Blood Gluc test strip Generic drug: glucose blood CHECK BLOOD SUGARS ONCE DAILY   glucose blood test strip Commonly known as: Accu-Chek Aviva 1 each by Other route daily. Use as instructed   Pulse Oximeter Misc 1 each by Does not apply route continuous as needed.   sertraline 50 MG tablet Commonly known as: ZOLOFT Take 1 tablet (50 mg total) by mouth daily.   VITAMIN D PO Take 5,000 Units by mouth every morning.   zolpidem 12.5 MG CR tablet Commonly known as: AMBIEN CR Take 1 tablet (12.5 mg total) by mouth at bedtime as needed. for sleep        Objective:   BP 124/75   Pulse 62   Temp 97.8 F (36.6 C) (Temporal)   Ht _0  (1.702 m)   Wt 277 lb 3.2 oz (125.7 kg)   SpO2 95%   BMI 43.42 kg/m   Wt Readings from Last 3 Encounters:  11/15/18 277 lb 3.2 oz (125.7 kg)  10/17/18 277 lb 8 oz (125.9 kg)  10/05/18 275 lb 9.6 oz (125 kg)    Physical Exam Vitals signs and nursing note reviewed.  Constitutional:      General: He is not in acute distress.    Appearance: He is well-developed. He is not diaphoretic.  Eyes:     General: No scleral icterus.       Right eye: No discharge.     Conjunctiva/sclera: Conjunctivae normal.     Pupils: Pupils are equal, round, and reactive to light.  Neck:     Musculoskeletal: Neck supple.     Thyroid: No thyromegaly.  Cardiovascular:     Rate and Rhythm: Normal rate and regular rhythm.     Heart sounds: Normal heart sounds. No murmur.  Pulmonary:     Effort: Pulmonary effort is normal. No respiratory distress.     Breath sounds: Normal breath sounds. No wheezing.   Musculoskeletal: Normal range of motion.  Lymphadenopathy:     Cervical: No cervical adenopathy.  Skin:    General: Skin is warm and dry.     Findings: No rash.  Neurological:     Mental Status: He is alert and oriented to person, place, and time.     Coordination: Coordination normal.  Psychiatric:        Behavior: Behavior normal.       Assessment & Plan:   Problem List Items Addressed This Visit  Cardiovascular and Mediastinum   Essential hypertension   Relevant Medications   lisinopril (ZESTRIL) 10 MG tablet   Other Relevant Orders   BMP8+EGFR   CHF (congestive heart failure) (HCC) - Primary   Relevant Medications   lisinopril (ZESTRIL) 10 MG tablet   Other Relevant Orders   BMP8+EGFR     Endocrine   Diabetes mellitus type II, non insulin dependent (HCC)   Relevant Medications   lisinopril (ZESTRIL) 10 MG tablet   Other Relevant Orders   Hemoglobin A1c   BMP8+EGFR      Will cut his lisinopril in half to 10 mg and see if that does better for the lightheadedness and dizziness, keep his other medications on the same.  Check A1c kidney function today Follow up plan: Return in about 3 months (around 02/15/2019), or if symptoms worsen or fail to improve, for Follow-up diabetes and hypertension.  Counseling provided for all of the vaccine components No orders of the defined types were placed in this encounter.   Caryl Pina, MD [facility] 11/15/2018, 1:08 PM

## 2018-11-16 LAB — BMP8+EGFR
BUN/Creatinine Ratio: 18 (ref 10–24)
BUN: 21 mg/dL (ref 8–27)
CO2: 26 mmol/L (ref 20–29)
Calcium: 9 mg/dL (ref 8.6–10.2)
Chloride: 99 mmol/L (ref 96–106)
Creatinine, Ser: 1.17 mg/dL (ref 0.76–1.27)
GFR calc Af Amer: 72 mL/min/{1.73_m2} (ref >59)
GFR calc non Af Amer: 62 mL/min/{1.73_m2} (ref >59)
Glucose: 134 mg/dL — ABNORMAL HIGH (ref 65–99)
Potassium: 4.2 mmol/L (ref 3.5–5.2)
Sodium: 140 mmol/L (ref 134–144)

## 2018-11-17 DIAGNOSIS — R0902 Hypoxemia: Secondary | ICD-10-CM | POA: Diagnosis not present

## 2018-11-17 DIAGNOSIS — R0600 Dyspnea, unspecified: Secondary | ICD-10-CM | POA: Diagnosis not present

## 2018-11-30 ENCOUNTER — Ambulatory Visit: Payer: Self-pay | Admitting: Licensed Clinical Social Worker

## 2018-11-30 DIAGNOSIS — G4700 Insomnia, unspecified: Secondary | ICD-10-CM

## 2018-11-30 DIAGNOSIS — K219 Gastro-esophageal reflux disease without esophagitis: Secondary | ICD-10-CM

## 2018-11-30 DIAGNOSIS — I5042 Chronic combined systolic (congestive) and diastolic (congestive) heart failure: Secondary | ICD-10-CM

## 2018-11-30 DIAGNOSIS — F411 Generalized anxiety disorder: Secondary | ICD-10-CM

## 2018-11-30 DIAGNOSIS — J441 Chronic obstructive pulmonary disease with (acute) exacerbation: Secondary | ICD-10-CM

## 2018-11-30 DIAGNOSIS — I10 Essential (primary) hypertension: Secondary | ICD-10-CM

## 2018-11-30 DIAGNOSIS — E119 Type 2 diabetes mellitus without complications: Secondary | ICD-10-CM

## 2018-11-30 NOTE — Chronic Care Management (AMB) (Signed)
Care Management Note   Kenneth Fuller is a 73 y.o. year old male who is a primary care patient of [name], [name], MD. The CM team was consulted for assistance with chronic disease management and care coordination.   I reached out to [name] by phone today.   Review of patient status, including review of consultants reports, relevant laboratory and other test results, and collaboration with appropriate care team members and the patient's provider was performed as part of comprehensive patient evaluation and provision of chronic care management services.   Social determinants of health: risk of physical inactivity; risk of tobacco use    Office Visit from 12/14/2017 in [place]  PHQ-9 Total Score  12     GAD 7 : Generalized Anxiety Score 11/11/2017  Nervous, Anxious, on Edge 3  Control/stop worrying 3  Worry too much - different things 3  Trouble relaxing 3  Restless 3  Easily annoyed or irritable 3  Afraid - awful might happen 0  Total GAD 7 Score 18  Anxiety Difficulty Somewhat difficult   Medications    albuterol (PROVENTIL HFA;VENTOLIN HFA) 108 (90 Base) MCG/ACT inhaler    atorvastatin (LIPITOR) 40 MG tablet    bisoprolol (ZEBETA) 5 MG tablet    Blood Glucose Monitoring Suppl (ACCU-CHEK AVIVA) device    Blood Pressure Monitoring (BLOOD PRESSURE CUFF) MISC    budesonide-formoterol (SYMBICORT) 160-4.5 MCG/ACT inhaler    Cholecalciferol (VITAMIN D PO)    clonazePAM (KLONOPIN) 0.5 MG tablet    furosemide (LASIX) 40 MG tablet    glimepiride (AMARYL) 4 MG tablet    glucose blood (ACCU-CHEK AVIVA) test strip    hydrALAZINE (APRESOLINE) 10 MG tablet    isosorbide dinitrate (ISORDIL) 30 MG tablet    Lancets (ACCU-CHEK SOFT TOUCH) lancets    lisinopril (ZESTRIL) 10 MG tablet    Misc. Devices (PULSE OXIMETER) MISC    mometasone-formoterol (DULERA) 100-5 MCG/ACT AERO    ONETOUCH DELICA LANCETS 43X MISC    OXYGEN    Potassium Chloride  ER 20 MEQ TBCR    PRODIGY NO CODING BLOOD GLUC test strip    sertraline (ZOLOFT) 50 MG tablet    zolpidem (AMBIEN CR) 12.5 MG CR tablet     Goals    . Client has stress related to ongoing health issues and wishes to talk more about stress over current health issues (pt-stated)     Current Barriers:  [name] Mental Health Concerns    .  Challenges related to managing health issues of client   Clinical Social Work Clinical Goal(s):  [name] Over the next 30 days, client will work with LCSW to address concerns related to health issues of client and client management of health issues faced  Interventions: . Provided patient with information about CCM program services   . Encouraged client to communicate with RNCM to discuss nursing needs of client . Talked with client about his management of health issues faced by client . Talked with client about medication procurement for client  Patient Self Care Activities:  . Self administers medications as prescribed . Attends all scheduled provider appointments . Performs ADL's independently   Plan:  LCSW to call client in next 3 weeks to talk with client about stress issues related to his management of health issues faced Client to communicate with RNCM to discuss nursing needs of client Client to attend scheduled client medical appointments   Initial goal documentation       Follow  Up Plan: LCSW to call client in next 3 weeks to talk with client about stress issues related to his management of health issues faced  [name].Desean Heemstra MSW, LCSW Licensed Clinical Social Worker [facility] Care Management [phone]

## 2018-11-30 NOTE — Patient Instructions (Signed)
Licensed Clinical Social Worker Visit Information  Goals we discussed today:  Goals        .     [name] Client has stress related to ongoing health issues and wishes to talk more about stress over current health issues (pt-stated)     Current Barriers:  [name] Mental Health Concerns    .  Challenges related to managing health issues of client   Clinical Social Work Clinical Goal(s):  [name] Over the next 30 days, client will work with LCSW to address concerns related to health issues of client and client management of health issues faced  Interventions: . Provided patient with information about CCM program services   . Encouraged client to communicate with RNCM to discuss nursing needs of client . Talked with client about his management of health issues faced by client . Talked with client about medication procurement for client  Patient Self Care Activities:  . Self administers medications as prescribed . Attends all scheduled provider appointments . Performs ADL's independently   Plan:  LCSW to call client in next 3 weeks to talk with client about stress issues related to his management of health issues faced Client to communicate with RNCM to discuss nursing needs of client Client to attend scheduled client medical appointments   Initial goal documentation    .             Materials Provided: No  Follow Up Plan: LCSW to call client in next 3 weeks to talk with client about stress issues related  to management of health issues faced  The patient verbalized understanding of instructions provided today and declined a print copy of patient instruction materials.   [name].Bitania Shankland MSW, LCSW Licensed Clinical Social Worker [facility] Care [facility] [phone]

## 2018-12-04 ENCOUNTER — Emergency Department (HOSPITAL_COMMUNITY)
Admission: EM | Admit: 2018-12-04 | Discharge: 2018-12-04 | Disposition: A | Discharge status: Home / Self Care | Payer: PPO | Attending: Emergency Medicine | Admitting: Emergency Medicine

## 2018-12-04 ENCOUNTER — Other Ambulatory Visit: Payer: Self-pay

## 2018-12-04 ENCOUNTER — Encounter (HOSPITAL_COMMUNITY): Payer: Self-pay

## 2018-12-04 DIAGNOSIS — R0602 Shortness of breath: Secondary | ICD-10-CM | POA: Diagnosis not present

## 2018-12-04 DIAGNOSIS — Z5321 Procedure and treatment not carried out due to patient leaving prior to being seen by health care provider: Secondary | ICD-10-CM | POA: Diagnosis not present

## 2018-12-04 DIAGNOSIS — R05 Cough: Secondary | ICD-10-CM | POA: Diagnosis present

## 2018-12-04 HISTORY — DX: Heart failure, unspecified: I50.9

## 2018-12-04 NOTE — ED Triage Notes (Signed)
Pt reports that he was exposed to positive covid. Pt reports cough and SOB. Pt has hx of CHF does not have any swelling. Uses O2 at night. And PRN during the day

## 2018-12-05 ENCOUNTER — Other Ambulatory Visit: Payer: Self-pay

## 2018-12-05 ENCOUNTER — Ambulatory Visit (INDEPENDENT_AMBULATORY_CARE_PROVIDER_SITE_OTHER): Payer: PPO

## 2018-12-05 ENCOUNTER — Ambulatory Visit (INDEPENDENT_AMBULATORY_CARE_PROVIDER_SITE_OTHER): Payer: PPO | Admitting: *Deleted

## 2018-12-05 ENCOUNTER — Ambulatory Visit
Admission: EM | Admit: 2018-12-05 | Discharge: 2018-12-05 | Disposition: A | Discharge status: Home / Self Care | Payer: PPO | Attending: Family Medicine | Admitting: Family Medicine

## 2018-12-05 DIAGNOSIS — R05 Cough: Secondary | ICD-10-CM | POA: Diagnosis not present

## 2018-12-05 DIAGNOSIS — R059 Cough, unspecified: Secondary | ICD-10-CM

## 2018-12-05 DIAGNOSIS — Z20828 Contact with and (suspected) exposure to other viral communicable diseases: Secondary | ICD-10-CM | POA: Diagnosis not present

## 2018-12-05 DIAGNOSIS — B9789 Other viral agents as the cause of diseases classified elsewhere: Secondary | ICD-10-CM

## 2018-12-05 DIAGNOSIS — J069 Acute upper respiratory infection, unspecified: Secondary | ICD-10-CM | POA: Diagnosis not present

## 2018-12-05 DIAGNOSIS — J449 Chronic obstructive pulmonary disease, unspecified: Secondary | ICD-10-CM

## 2018-12-05 DIAGNOSIS — Z20822 Contact with and (suspected) exposure to covid-19: Secondary | ICD-10-CM

## 2018-12-05 DIAGNOSIS — R0602 Shortness of breath: Secondary | ICD-10-CM

## 2018-12-05 MED ORDER — BENZONATATE 200 MG PO CAPS: 200.0000 mg | ORAL_CAPSULE | Freq: Three times a day (TID) | ORAL | 0 refills | Status: AC | PRN

## 2018-12-05 MED ORDER — PREDNISONE 20 MG PO TABS: 40.0000 mg | ORAL_TABLET | Freq: Every day | ORAL | 0 refills | Status: AC

## 2018-12-05 NOTE — Chronic Care Management (AMB) (Signed)
Chronic Care Management   Follow Up Note   12/05/2018 Name: Kenneth Fuller MRN: [id number] DOB: 1945-09-06  Referred by: [name], [name], MD Reason for referral : Chronic Care Management (RN Follow Up)   Kenneth Fuller is a 73 y.o. year old male who is a primary care patient of [name], [name], MD. The CCM team was consulted for assistance with chronic disease management and care coordination needs.    Review of patient status, including review of consultants reports, relevant laboratory and other test results, and collaboration with appropriate care team members and the patient's provider was performed as part of comprehensive patient evaluation and provision of chronic care management services.    I spoke with Kenneth Fuller by telephone today. He was scheduled for an RN follow-up but chart review revealed that he presented to the ED last night with SOB and URI symptoms. He states that he's been sick since 12/01/2018 and that he is taking some OTC meds without relief. His symptoms are not worsening but not improving either. He does have a productive cough at times with thick, discolored sputum. Tactile fever and chills but thermometer reads normal. He does not have a home pulse oximeter to check his oxygen level. He's using 3L of pulse O2 via nasal canula during the day and 2.5L of continuous O2 at night. That is his normal usage.    Outpatient Encounter Medications as of 12/05/2018  Medication Sig  . albuterol (PROVENTIL HFA;VENTOLIN HFA) 108 (90 Base) MCG/ACT inhaler Inhale 2 puffs into the lungs every 6 (six) hours as needed for wheezing or shortness of breath.  [name] atorvastatin (LIPITOR) 40 MG tablet Take 1 tablet by mouth once daily  . bisoprolol (ZEBETA) 5 MG tablet Take 0.5 tablets (2.5 mg total) by mouth daily.  . Blood Glucose Monitoring Suppl (ACCU-CHEK AVIVA) device Use as instructed  . Blood Pressure Monitoring (BLOOD PRESSURE CUFF) MISC 1 each by Does not apply  route daily.  . budesonide-formoterol (SYMBICORT) 160-4.5 MCG/ACT inhaler Inhale 2 puffs into the lungs 2 (two) times daily.  . Cholecalciferol (VITAMIN D PO) Take 5,000 Units by mouth every morning.   . clonazePAM (KLONOPIN) 0.5 MG tablet Take 1 tablet (0.5 mg total) by mouth at bedtime as needed.  . furosemide (LASIX) 40 MG tablet For a weight of 270 or higher, take 80 mg twice daily. For a weight of 269 and lower, take 80 mg in the morning and 40 mg in the evening.  [name] glimepiride (AMARYL) 4 MG tablet TAKE 1 TABLET BY MOUTH WITH BREAKFAST  . glucose blood (ACCU-CHEK AVIVA) test strip 1 each by Other route daily. Use as instructed  . hydrALAZINE (APRESOLINE) 10 MG tablet Take 1 tablet (10 mg total) by mouth 3 (three) times daily.  . isosorbide dinitrate (ISORDIL) 30 MG tablet Take 0.5 tablets (15 mg total) by mouth 3 (three) times daily.  . Lancets (ACCU-CHEK SOFT TOUCH) lancets 1 each by Other route daily. Use as instructed  . lisinopril (ZESTRIL) 10 MG tablet Take 1 tablet (10 mg total) by mouth daily.  . Misc. Devices (PULSE OXIMETER) MISC 1 each by Does not apply route continuous as needed.  . mometasone-formoterol (DULERA) 100-5 MCG/ACT AERO Inhale 2 puffs into the lungs 2 (two) times daily.  [name] DELICA LANCETS 66Y MISC 1 each by Does not apply route daily. Test 1X per day and prn  . OXYGEN Inhale 2 L into the lungs at bedtime. 2lpm with sleep   .  Potassium Chloride ER 20 MEQ TBCR Take 1 tablet by mouth daily.  [name] PRODIGY NO CODING BLOOD GLUC test strip CHECK BLOOD SUGARS ONCE DAILY  . sertraline (ZOLOFT) 50 MG tablet Take 1 tablet (50 mg total) by mouth daily.  [name] zolpidem (AMBIEN CR) 12.5 MG CR tablet Take 1 tablet (12.5 mg total) by mouth at bedtime as needed. for sleep   No facility-administered encounter medications on file as of 12/05/2018.     Goals Addressed            This Visit's Progress     Patient Stated   . COPD Symptom Management (pt-stated)       Current  Barriers:  . Chronic Disease Management support and education needs related to COPD & shortness of breath  Nurse Case Manager Clinical Goal(s):  [name] Within the next 24 hours, patient will seek evaluation of URI symptoms at [facility] in [place] . Within the next 24 hours, patient will have Covid-19 testing  Interventions:  . Chart reviewed. Patient presented to [facility] ED last night with URI symptoms. He was triaged but left before being seen after waiting 3 hours. . Discussed current symptoms of nasal & chest congestion, productive cough, chills, and SOB 3-4 days. Has used OTC treatments. Using 3L O2 via nasal canula. Does not have a pulse ox at home. . Discussed plans with patient for ongoing care management follow up and provided patient with direct contact information for care management team . RN to assist with obtaining pulse oximeter for home use. Placed order for [facility] to research options.  Nash Dimmer with PCP clinical staff . Advised patient to present to [facility] on [address] in [place] for assessment of URI symptoms in a patient with COPD and continuous oxygen use.  [name] Provided patient with RN CCM direct contact information. Encouraged to reach out as needed.  . RN to follow up with patient by telephone tomorrow.  . Verified that he received inhalers from patient assistance program by mail yesterday  Patient Self Care Activities:  . Performs ADL's independently . Performs IADL's independently . Unable to independently N/A  Initial goal documentation       I attempted to call ahead to the Urgent Care but there was no answer.   Plan Patient to go to [facility] for evaluation today.  Covid testing recommended. Patient will call RN CCM at [phone] if he needs any assistance RN will f/u with patient by telephone tomorrow   [name], BSN, RN-BC [place] /  [place] Management Direct Dial: [phone]

## 2018-12-05 NOTE — Patient Instructions (Signed)
Visit Information  Goals Addressed            This Visit's Progress     Patient Stated   . COPD Symptom Management (pt-stated)       Current Barriers:  . Chronic Disease Management support and education needs related to COPD & shortness of breath  Nurse Case Manager Clinical Goal(s):  [name] Within the next 24 hours, patient will seek evaluation of URI symptoms at [facility] in [place] . Within the next 24 hours, patient will have Covid-19 testing  Interventions:  . Chart reviewed. Patient presented to [facility] ED last night with URI symptoms. He was triaged but left before being seen after waiting 3 hours. . Discussed current symptoms of nasal & chest congestion, productive cough, chills, and SOB 3-4 days. Has used OTC treatments. Using 3L O2 via nasal canula. Does not have a pulse ox at home. . Discussed plans with patient for ongoing care management follow up and provided patient with direct contact information for care management team . RN to assist with obtaining pulse oximeter for home use. Placed order for [facility] to research options.  Nash Dimmer with PCP clinical staff . Advised patient to present to [facility] on [address] in [place] for assessment of URI symptoms in a patient with COPD and continuous oxygen use.  [name] Provided patient with RN CCM direct contact information. Encouraged to reach out as needed.  . RN to follow up with patient by telephone tomorrow.  . Verified that he received inhalers from patient assistance program by mail yesterday  Patient Self Care Activities:  . Performs ADL's independently . Performs IADL's independently . Unable to independently N/A  Initial goal documentation        The patient verbalized understanding of instructions provided today and declined a print copy of patient instruction materials.   Plan Patient to go to [facility] for evaluation today.  Covid testing  recommended. Patient will call RN CCM at [phone] if he needs any assistance RN will f/u with patient by telephone tomorrow   [name], BSN, RN-BC [place] / [place] Management Direct Dial: [phone]

## 2018-12-05 NOTE — ED Provider Notes (Signed)
[facility]    CSN: [id number] Arrival date & time: 12/05/18  1405      History   Chief Complaint Chief Complaint  Patient presents with  . Cough  . Shortness of Breath    HPI Kenneth Fuller is a 73 y.o. male history of COPD, CKD stage 3, CAD, CHF, DM type II, GERD, hypertension, hyperlipidemia, OSA presenting today for evaluation of cough and shortness of breath.  Patient had positive exposure to Covid approximately 1 week ago.  Since he has developed cough, congestion and shortness of breath.  Symptoms began on Friday, approximately 4 to 5 days ago.  He denies any known fevers.  Has felt some chills and body aches.  Cough and symptoms feel different from his typical COPD exacerbations.  He wears 2.5 L oxygen at nighttime, wears 2-2.5 L as needed during the day, but has increased this to 3 of recently.  Has had some occasional wheezing.  Has tried Sudafed and Mucinex for symptoms.  Cough is occasionally productive.  Does feel worse short of breath with lying flat.  Has not noticed any swelling to extremities.  HPI  Past Medical History:  Diagnosis Date  . Allergy   . Asthma   . CAD (coronary artery disease)   . CHF (congestive heart failure) ([place])   . Diabetes mellitus   . Fibromyalgia   . GERD (gastroesophageal reflux disease)   . GI bleeding   . Gout   . Hyperlipidemia   . Hypertension   . Hypogonadism male   . Insomnia   . MRSA cellulitis   . Neuropathy   . Obesity   . Shortness of breath dyspnea    with exertion   . Sleep apnea    cpap- 14   . Wheezing    no asthma diagnosis    Patient Active Problem List   Diagnosis Date Noted  . Chest pain 09/23/2018  . Acute on chronic diastolic CHF (congestive heart failure) ([place]) 09/23/2018  . Insomnia   . CHF (congestive heart failure) ([place]) 01/06/2018  . Depression with anxiety 01/04/2018  . CKD (chronic kidney disease), stage III 01/04/2018  . Chronic obstructive pulmonary disease ([place]) 01/04/2018   . Depression, recurrent ([place]) 11/11/2017  . Chronic diastolic heart failure ([place]) 04/13/2017  . Swelling of lower extremity 11/30/2016  . Obesity, Class III, BMI 40-49.9 (morbid obesity) ([place]) 08/21/2015  . Coronary artery disease involving native coronary artery of native heart without angina pectoris 12/05/2014  . Urinary urgency 10/15/2014  . Spinal stenosis, lumbar region, with neurogenic claudication 01/02/2014  . Hyperlipidemia associated with type 2 diabetes mellitus ([place]) 10/25/2013  . COLONIC POLYPS, ADENOMATOUS 02/25/2007  . Diabetes mellitus type II, non insulin dependent ([place]) 02/25/2007  . Gout 02/25/2007  . Essential hypertension 02/25/2007  . Cough variant asthma 02/25/2007  . OSA on CPAP 02/25/2007  . FATIGUE, CHRONIC 02/25/2007  . HEADACHE, CHRONIC 02/25/2007  . GERD (gastroesophageal reflux disease) 02/25/2007    Past Surgical History:  Procedure Laterality Date  . BACK SURGERY    . CARDIAC CATHETERIZATION    . COLONOSCOPY    . LEFT HEART CATH AND CORONARY ANGIOGRAPHY N/A 12/02/2016   Procedure: LEFT HEART CATH AND CORONARY ANGIOGRAPHY;  Surgeon: Jettie Booze, MD;  Location: [facility];  Service: Cardiovascular;  Laterality: N/A;  . LUMBAR LAMINECTOMY/DECOMPRESSION MICRODISCECTOMY N/A 01/02/2014   Procedure: CENTRAL DECOMPRESSION LUMBAR LAMINECTOMY L3-L4, L4-L5;  Surgeon: Tobi Bastos, MD;  Location: [facility];  Service: Orthopedics;  Laterality: N/A;  . neck fusion         Home Medications    Prior to Admission medications   Medication Sig Start Date End Date Taking? Authorizing Provider  albuterol (PROVENTIL HFA;VENTOLIN HFA) 108 (90 Base) MCG/ACT inhaler Inhale 2 puffs into the lungs every 6 (six) hours as needed for wheezing or shortness of breath. 07/20/17   Brand Males, MD  atorvastatin (LIPITOR) 40 MG tablet Take 1 tablet by mouth once daily 11/07/18   [name], [name], MD  benzonatate (TESSALON) 200 MG capsule Take 1 capsule  (200 mg total) by mouth 3 (three) times daily as needed for up to 7 days for cough. 12/05/18 12/12/18  ,  C, PA-C  bisoprolol (ZEBETA) 5 MG tablet Take 0.5 tablets (2.5 mg total) by mouth daily. 10/05/18   [name], [name], MD  Blood Glucose Monitoring Suppl (ACCU-CHEK AVIVA) device Use as instructed 04/18/18 04/18/19  [name], [name], MD  Blood Pressure Monitoring (BLOOD PRESSURE CUFF) MISC 1 each by Does not apply route daily. 05/22/18   [name], [name], MD  budesonide-formoterol (SYMBICORT) 160-4.5 MCG/ACT inhaler Inhale 2 puffs into the lungs 2 (two) times daily. 01/09/18   Barton Dubois, MD  Cholecalciferol (VITAMIN D PO) Take 5,000 Units by mouth every morning.     [provider]  clonazePAM (KLONOPIN) 0.5 MG tablet Take 1 tablet (0.5 mg total) by mouth at bedtime as needed. 10/05/18   [name], [name], MD  furosemide (LASIX) 40 MG tablet For a weight of 270 or higher, take 80 mg twice daily. For a weight of 269 and lower, take 80 mg in the morning and 40 mg in the evening. 10/17/18   Croitoru, Mihai, MD  glimepiride (AMARYL) 4 MG tablet TAKE 1 TABLET BY MOUTH WITH BREAKFAST 01/18/18   [name], [name], MD  glucose blood (ACCU-CHEK AVIVA) test strip 1 each by Other route daily. Use as instructed 04/18/18   [name], [name], MD  hydrALAZINE (APRESOLINE) 10 MG tablet Take 1 tablet (10 mg total) by mouth 3 (three) times daily. 10/05/18 10/05/19  [name], [name], MD  isosorbide dinitrate (ISORDIL) 30 MG tablet Take 0.5 tablets (15 mg total) by mouth 3 (three) times daily. 10/17/18   Croitoru, Mihai, MD  Lancets (ACCU-CHEK SOFT TOUCH) lancets 1 each by Other route daily. Use as instructed 04/18/18   [name], [name], MD  lisinopril (ZESTRIL) 10 MG tablet Take 1 tablet (10 mg total) by mouth daily. 11/15/18   [name], [name], MD  Misc. Devices (PULSE OXIMETER) MISC 1 each by Does not apply route continuous as needed. 07/26/18   [name], [name], MD   mometasone-formoterol (DULERA) 100-5 MCG/ACT AERO Inhale 2 puffs into the lungs 2 (two) times daily.    [provider]  [facility] DELICA LANCETS 00F MISC 1 each by Does not apply route daily. Test 1X per day and prn 03/30/17   [name], [name], MD  OXYGEN Inhale 2 L into the lungs at bedtime. 2lpm with sleep     [provider]  Potassium Chloride ER 20 MEQ TBCR Take 1 tablet by mouth daily. 08/08/18   Almyra Deforest, PA  predniSONE (DELTASONE) 20 MG tablet Take 2 tablets (40 mg total) by mouth daily with breakfast for 5 days. 12/05/18 12/10/18  ,  C, PA-C  PRODIGY NO CODING BLOOD GLUC test strip CHECK BLOOD SUGARS ONCE DAILY 01/17/18   [name], [name], MD  sertraline (ZOLOFT) 50 MG tablet Take 1 tablet (  50 mg total) by mouth daily. 10/05/18   [name], [name], MD  zolpidem (AMBIEN CR) 12.5 MG CR tablet Take 1 tablet (12.5 mg total) by mouth at bedtime as needed. for sleep 10/19/18   [name], [name], MD    Family History Family History  Problem Relation Age of Onset  . Colon cancer Mother   . Diabetes Father        siblings  . Heart disease Father        brother  . Heart attack Father   . Kidney disease Sister   . Heart failure Sister   . Heart disease Brother   . Heart attack Son 36  . Drug abuse Sister   . Heart disease Brother   . Deep vein thrombosis Brother   . Colon polyps Neg Hx     Social History Social History   Tobacco Use  . Smoking status: Former Smoker    Quit date: 02/08/1969    Years since quitting: 49.8  . Smokeless tobacco: Former Systems developer    Quit date: 1971  Substance Use Topics  . Alcohol use: No    Alcohol/week: 0.0 standard drinks  . Drug use: No     Allergies   Amlodipine besy-benazepril hcl, Oxycodone, Phenergan [promethazine hcl], and Hydrocodone   Review of Systems Review of Systems  Constitutional: Positive for chills. Negative for activity change, appetite change, fatigue and fever.  HENT: Positive for  congestion. Negative for ear pain, rhinorrhea, sinus pressure, sore throat and trouble swallowing.   Eyes: Negative for discharge and redness.  Respiratory: Positive for cough and shortness of breath. Negative for chest tightness.   Cardiovascular: Negative for chest pain.  Gastrointestinal: Negative for abdominal pain, diarrhea, nausea and vomiting.  Musculoskeletal: Positive for myalgias.  Skin: Negative for rash.  Neurological: Negative for dizziness, light-headedness and headaches.     Physical Exam Triage Vital Signs ED Triage Vitals  Enc Vitals Group     BP      Pulse      Resp      Temp      Temp src      SpO2      Weight      Height      Head Circumference      Peak Flow      Pain Score      Pain Loc      Pain Edu?      Excl. in [place]?    Orthostatic VS for the past 24 hrs:  BP- Sitting Pulse- Sitting BP- Standing at 0 minutes Pulse- Standing at 0 minutes  12/05/18 1529 160/79 65 (!) 167/92 68    Updated Vital Signs BP (!) 160/79   Pulse 64   Temp 98.2 F (36.8 C)   Resp (!) 22   SpO2 97%   Visual Acuity Right Eye Distance:   Left Eye Distance:   Bilateral Distance:    Right Eye Near:   Left Eye Near:    Bilateral Near:     Physical Exam Vitals signs and nursing note reviewed.  Constitutional:      Appearance: He is well-developed.     Comments: Appears slightly uncomfortable, but no acute distress  HENT:     Head: Normocephalic and atraumatic.     Ears:     Comments: Bilateral ears without tenderness to palpation of external auricle, tragus and mastoid, EAC's without erythema or swelling, TM's with good bony landmarks and cone of light. Non  erythematous.     Mouth/Throat:     Comments: Oral mucosa pink and moist, no tonsillar enlargement or exudate. Posterior pharynx patent and nonerythematous, no uvula deviation or swelling. Normal phonation. Eyes:     Conjunctiva/sclera: Conjunctivae normal.  Neck:     Musculoskeletal: Neck supple.   Cardiovascular:     Rate and Rhythm: Normal rate and regular rhythm.     Heart sounds: No murmur.  Pulmonary:     Effort: Pulmonary effort is normal. No respiratory distress.     Breath sounds: Normal breath sounds.     Comments: Breathing comfortably at rest, frequent coughing during visit, faint expiratory wheezing noted inconsistently throughout all lung fields Abdominal:     Palpations: Abdomen is soft.     Tenderness: There is no abdominal tenderness.  Skin:    General: Skin is warm and dry.  Neurological:     Mental Status: He is alert.      UC Treatments / Results  Labs (all labs ordered are listed, but only abnormal results are displayed) Labs Reviewed  NOVEL CORONAVIRUS, NAA    EKG   Radiology Dg Chest 2 View  Result Date: 12/05/2018 CLINICAL DATA:  Cough, shortness of breath, covid exposure EXAM: CHEST - 2 VIEW COMPARISON:  September 22, 2018 FINDINGS: The heart size and mediastinal contours are within normal limits. Both lungs are clear. The visualized skeletal structures are unremarkable. IMPRESSION: No acute cardiopulmonary process. Electronically Signed   By: Prudencio Pair M.D.   On: 12/05/2018 15:40    Procedures Procedures (including critical care time)  Medications Ordered in UC Medications - No data to display  Initial Impression / Assessment and Plan / UC Course  I have reviewed the triage vital signs and the nursing notes.  Pertinent labs & imaging results that were available during my care of the patient were reviewed by me and considered in my medical decision making (see chart for details).     Covid swab pending.  Chest x-ray negative for pulmonary edema or pneumonia.  Most likely viral.  Given wheezing auscultated will go ahead and place on short course of prednisone, also continue with inhalers as prescribed, Tessalon as needed for cough.  Blood pressure initially 94/62, repeat blood pressure readings reassuring.  Discussed strict return  precautions. Patient verbalized understanding and is agreeable with plan.  Final Clinical Impressions(s) / UC Diagnoses   Final diagnoses:  Viral URI with cough  Exposure to COVID-19 virus     Discharge Instructions     COVID swab pending I will call with results of chest xray May use tessalon as needed for cough every 8 hours Prednisone 40 mg daily with food x 5 days for inflammation in chest/wheezing Continue inhalers as needed  Please follow up in emergency room if symptoms worsening, developing increased shortness of breath, weakness, or lightheadedness    ED Prescriptions    Medication Sig Dispense Auth. Provider   predniSONE (DELTASONE) 20 MG tablet Take 2 tablets (40 mg total) by mouth daily with breakfast for 5 days. 10 tablet ,  C, PA-C   benzonatate (TESSALON) 200 MG capsule Take 1 capsule (200 mg total) by mouth 3 (three) times daily as needed for up to 7 days for cough. 28 capsule , Circle C, PA-C     I have reviewed the PDMP during this encounter.   Janith Lima, [place] 12/05/18 1617

## 2018-12-05 NOTE — ED Triage Notes (Signed)
Pt presents with cough and sob that began a couple days ago. Pt has h/o copd and had positive exposure on Saturday . Pt left without being seen in ed last night

## 2018-12-05 NOTE — Discharge Instructions (Addendum)
COVID swab pending I will call with results of chest xray May use tessalon as needed for cough every 8 hours Prednisone 40 mg daily with food x 5 days for inflammation in chest/wheezing Continue inhalers as needed  Please follow up in emergency room if symptoms worsening, developing increased shortness of breath, weakness, or lightheadedness

## 2018-12-06 ENCOUNTER — Telehealth: Payer: PPO | Admitting: *Deleted

## 2018-12-07 ENCOUNTER — Telehealth (HOSPITAL_COMMUNITY): Payer: Self-pay | Admitting: Emergency Medicine

## 2018-12-07 ENCOUNTER — Telehealth: Payer: Self-pay

## 2018-12-07 LAB — NOVEL CORONAVIRUS, NAA: SARS-CoV-2, NAA: DETECTED — AB

## 2018-12-07 NOTE — Telephone Encounter (Signed)
12/07/2018 Spoke with patient about contacting his [employer] Medicare insurance they may cover the cost of a pulse oximeter. Left message on voicemail for Drenda Freeze, RN regarding this. Kenneth Fuller [phone]

## 2018-12-07 NOTE — Telephone Encounter (Signed)
Patient and several other positive patients all live together in the same home. I called the patient and spoke to everyone on speaker about their test results. Gave family quarantine time periods, offered documentation if needed, they did not have any further questions. Instructed them on s/s to watch out for and when to head to the emergency room for evaluation. Gave my office number for follow up if they need it. Pt and family verbalized understanding, all questions answered. Kenneth Fuller

## 2018-12-08 ENCOUNTER — Ambulatory Visit: Payer: PPO | Admitting: *Deleted

## 2018-12-08 DIAGNOSIS — J449 Chronic obstructive pulmonary disease, unspecified: Secondary | ICD-10-CM

## 2018-12-08 DIAGNOSIS — J9611 Chronic respiratory failure with hypoxia: Secondary | ICD-10-CM

## 2018-12-08 DIAGNOSIS — J9612 Chronic respiratory failure with hypercapnia: Secondary | ICD-10-CM

## 2018-12-08 DIAGNOSIS — U071 COVID-19: Secondary | ICD-10-CM

## 2018-12-08 NOTE — Chronic Care Management (AMB) (Signed)
  Chronic Care Management   Follow Up Note   12/08/2018 Name: Kenneth Fuller MRN: [id number] DOB: 08-31-1945  Referred by: [name], [name], MD Reason for referral : Chronic Care Management (RN follow-up)   Kenneth Fuller is a 73 y.o. year old male who is a primary care patient of [name], [name], MD. The CCM team was consulted for assistance with chronic disease management and care coordination needs.    Review of patient status, including review of consultants reports, relevant laboratory and other test results, and collaboration with appropriate care team members and the patient's provider was performed as part of comprehensive patient evaluation and provision of chronic care management services.    I spoke with patient by telephone today. He, his wife, and their 64 yr-old son had a positive Covid-19 test a few days ago after I encouraged them to seek evaluation and testing at [facility] in [place].    Care Plan     . COPD Symptom Management (pt-stated)       Respiratory management with acute Covid-19 infection in setting of chronic lung disease  Current Barriers:  . Chronic Disease Management support and education needs related to COPD & shortness of breath  Nurse Case Manager Clinical Goal(s):  [name] Over the next 7 days, patient will see an improvement in acute respiratory symptoms related to COVID-19 infection and complicated by chronic lung disease.  . Over the next 7 days, patient will f/u with healthcare team as needed . Over the next 14 days, patient will obtain a home pulse oximeter for personal use  Interventions:  . Chart reviewed o Verified Positive Covid-19 test o He was notified and educated . Discussed current symptoms with patient o Continues to have increased SOB above baesline o Using O2 at 3L via nasal canula o Productive cough at times with thick sputum o No fever but pos for chilss . Encouraged increased water intake to thin  mucus . Encouraged to seek emergency medical attention if needed . F/u with PCP or other healthcare provider with new or worsening symptoms . Reviewed s/s of worsening infection and potential emergency situations . Provided with RN CCM telephone number. Reach out with any nursing needs . Collaboration with PCP/clinical staff regarding DME order for pulse ox o Discussed potential insurance coverage with patient o Can purchase out-of-pocket if insurance does not cover o DME Rx may encourage pharmacy/DME supplier to hold a pulse ox for patient pick-up since they sell out quickly o Script printed. Clinical staff will obtain MD signature and fax to [place] to check on coverage . Continue using oxygen as directed . General education Re: Covid provided . Follow recommended quarantine guidelines  Patient Self Care Activities:  . Performs ADL's independently . Performs IADL's independently . Unable to independently N/A  Please see past updates related to this goal by clicking on the "Past Updates" button in the selected goal         Follow-up RN CCM will f/u with patient by telephone over the next 7 days PCP office will order DME Pulse Ox Patient will seek medical attention for any new or worsening symptoms  [name], BSN, RN-BC [place] [facility] [place] [facility] Direct Dial: [phone]

## 2018-12-08 NOTE — Patient Instructions (Signed)
  Care Plan     . COPD Symptom Management (pt-stated)       Respiratory management with acute Covid-19 infection in setting of chronic lung disease  Current Barriers:  . Chronic Disease Management support and education needs related to COPD & shortness of breath  Nurse Case Manager Clinical Goal(s):  [name] Over the next 7 days, patient will see an improvement in acute respiratory symptoms related to COVID-19 infection and complicated by chronic lung disease.  . Over the next 7 days, patient will f/u with healthcare team as needed . Over the next 14 days, patient will obtain a home pulse oximeter for personal use  Interventions:  . Chart reviewed o Verified Positive Covid-19 test o He was notified and educated . Discussed current symptoms with patient o Continues to have increased SOB above baesline o Using O2 at 3L via nasal canula o Productive cough at times with thick sputum o No fever but pos for chilss . Encouraged increased water intake to thin mucus . Encouraged to seek emergency medical attention if needed . F/u with PCP or other healthcare provider with new or worsening symptoms . Reviewed s/s of worsening infection and potential emergency situations . Provided with RN CCM telephone number. Reach out with any nursing needs . Collaboration with PCP/clinical staff regarding DME order for pulse ox o Discussed potential insurance coverage with patient o Can purchase out-of-pocket if insurance does not cover o DME Rx may encourage pharmacy/DME supplier to hold a pulse ox for patient pick-up since they sell out quickly o Script printed. Clinical staff will obtain MD signature and fax to [place] to check on coverage . Continue using oxygen as directed . General education Re: Covid provided . Follow recommended quarantine guidelines  Patient Self Care Activities:  . Performs ADL's independently . Performs IADL's independently . Unable to independently N/A  Please see  past updates related to this goal by clicking on the "Past Updates" button in the selected goal         Follow-up RN CCM will f/u with patient by telephone over the next 7 days PCP office will order DME Pulse Ox Patient will seek medical attention for any new or worsening symptoms  [name], BSN, RN-BC [place] [facility] [place] Management Direct Dial: [phone]

## 2018-12-11 ENCOUNTER — Encounter: Payer: Self-pay | Admitting: Family Medicine

## 2018-12-11 ENCOUNTER — Ambulatory Visit: Payer: Self-pay | Admitting: *Deleted

## 2018-12-11 ENCOUNTER — Telehealth: Payer: Self-pay | Admitting: Family Medicine

## 2018-12-11 ENCOUNTER — Ambulatory Visit (INDEPENDENT_AMBULATORY_CARE_PROVIDER_SITE_OTHER): Payer: PPO | Admitting: Family Medicine

## 2018-12-11 DIAGNOSIS — J449 Chronic obstructive pulmonary disease, unspecified: Secondary | ICD-10-CM

## 2018-12-11 DIAGNOSIS — U071 COVID-19: Secondary | ICD-10-CM | POA: Diagnosis not present

## 2018-12-11 DIAGNOSIS — E119 Type 2 diabetes mellitus without complications: Secondary | ICD-10-CM

## 2018-12-11 DIAGNOSIS — J984 Other disorders of lung: Secondary | ICD-10-CM

## 2018-12-11 MED ORDER — AZITHROMYCIN 250 MG PO TABS: ORAL_TABLET | ORAL | 0 refills | Status: DC

## 2018-12-11 NOTE — Patient Instructions (Signed)
Prevent the Spread of COVID-19 if You Are Sick If you are sick with COVID-19 or think you might have COVID-19, follow the steps below to help protect other people in your home and community. Stay home except to get medical care.  Stay home. Most people with COVID-19 have mild illness and are able to recover at home without medical care. Do not leave your home, except to get medical care. Do not visit public areas.  Take care of yourself. Get rest and stay hydrated.  Get medical care when needed. Call your doctor before you go to their office for care. But, if you have trouble breathing or other concerning symptoms, call 911 for immediate help.  Avoid public transportation, ride-sharing, or taxis. Separate yourself from other people and pets in your home.  As much as possible, stay in a specific room and away from other people and pets in your home. Also, you should use a separate bathroom, if available. If you need to be around other people or animals in or outside of the home, wear a cloth face covering. ? See COVID-19 and Animals if you have questions about pets: [email or web address].biz/ Monitor your symptoms.  Common symptoms of COVID-19 include fever and cough. Trouble breathing is a more serious symptom that means you should get medical attention.  Follow care instructions from your healthcare provider and local health department. Your local health authorities will give instructions on checking your symptoms and reporting information. If you develop emergency warning signs for COVID-19 get medical attention immediately.  Emergency warning signs include*:  Trouble breathing  Persistent pain or pressure in the chest  New confusion or not able to be woken  Bluish lips or face *This list is not all inclusive. Please consult your medical provider for any other symptoms that are severe or concerning to you. Call 911 if you have a medical  emergency. If you have a medical emergency and need to call 911, notify the operator that you have or think you might have, COVID-19. If possible, put on a facemask before medical help arrives. Call ahead before visiting your doctor.  Call ahead. Many medical visits for routine care are being postponed or done by phone or telemedicine.  If you have a medical appointment that cannot be postponed, call your doctor's office. This will help the office protect themselves and other patients. If you are sick, wear a cloth covering over your nose and mouth.  You should wear a cloth face covering over your nose and mouth if you must be around other people or animals, including pets (even at home).  You don't need to wear the cloth face covering if you are alone. If you can't put on a cloth face covering (because of trouble breathing for example), cover your coughs and sneezes in some other way. Try to stay at least 6 feet away from other people. This will help protect the people around you. Note: During the COVID-19 pandemic, medical grade facemasks are reserved for healthcare workers and some first responders. You may need to make a cloth face covering using a scarf or bandana. Cover your coughs and sneezes.  Cover your mouth and nose with a tissue when you cough or sneeze.  Throw used tissues in a lined trash can.  Immediately wash your hands with soap and water for at least 20 seconds. If soap and water are not available, clean your hands with an alcohol-based hand sanitizer that contains at least 60% alcohol. Clean your hands often.  Wash your hands often with soap and water for at least 20 seconds. This is especially important after blowing your nose, coughing, or sneezing; going to the bathroom; and before eating or preparing food.  Use hand sanitizer if soap and water are not available. Use an alcohol-based hand sanitizer with at least 60% alcohol, covering all surfaces of your hands and rubbing  them together until they feel dry.  Soap and water are the best option, especially if your hands are visibly dirty.  Avoid touching your eyes, nose, and mouth with unwashed hands. Avoid sharing personal household items.  Do not share dishes, drinking glasses, cups, eating utensils, towels, or bedding with other people in your home.  Wash these items thoroughly after using them with soap and water or put them in the dishwasher. Clean all "high-touch" surfaces everyday.  Clean and disinfect high-touch surfaces in your "sick room" and bathroom. Let someone else clean and disinfect surfaces in common areas, but not your bedroom and bathroom.  If a caregiver or other person needs to clean and disinfect a sick person's bedroom or bathroom, they should do so on an as-needed basis. The caregiver/other person should wear a mask and wait as long as possible after the sick person has used the bathroom. High-touch surfaces include phones, remote controls, counters, tabletops, doorknobs, bathroom fixtures, toilets, keyboards, tablets, and bedside tables.  Clean and disinfect areas that may have blood, stool, or body fluids on them.  Use household cleaners and disinfectants. Clean the area or item with soap and water or another detergent if it is dirty. Then use a household disinfectant. ? Be sure to follow the instructions on the label to ensure safe and effective use of the product. Many products recommend keeping the surface wet for several minutes to ensure germs are killed. Many also recommend precautions such as wearing gloves and making sure you have good ventilation during use of the product. ? Most EPA-registered household disinfectants should be effective. How to discontinue home isolation  People with COVID-19 who have stayed home (home isolated) can stop home isolation under the following conditions: ? If you will not have a test to determine if you are still contagious, you can leave home  after these three things have happened:  You have had no fever for at least 72 hours (that is three full days of no fever without the use of medicine that reduces fevers) AND  other symptoms have improved (for example, when your cough or shortness of breath has improved) AND  at least 10 days have passed since your symptoms first appeared. ? If you will be tested to determine if you are still contagious, you can leave home after these three things have happened:  You no longer have a fever (without the use of medicine that reduces fevers) AND  other symptoms have improved (for example, when your cough or shortness of breath has improved) AND  you received two negative tests in a row, 24 hours apart. Your doctor will follow CDC guidelines. In all cases, follow the guidance of your healthcare provider and local health department. The decision to stop home isolation should be made in consultation with your healthcare provider and state and local health departments. Local decisions depend on local circumstances. [email or web address] 06/11/2018 This information is not intended to replace advice given to you by your health care provider. Make sure you discuss any questions you have with your health care provider. Document Released: 05/23/2018 Document Revised: 06/21/2018 Document Reviewed: 05/23/2018  Elsevier Patient Education  [employer].

## 2018-12-11 NOTE — Telephone Encounter (Signed)
Please advise on request for cough medicine.

## 2018-12-11 NOTE — Telephone Encounter (Signed)
I spoke with patient as part of CCM and scheduled him a televisit with Dr Lajuana Ripple for today.   [name], BSN, RN-BC Embedded Chronic Care Manager [facility] / [place] [facility] Direct Dial: [phone]

## 2018-12-11 NOTE — Patient Instructions (Signed)
CCM Care Plan     . COPD Symptom Management       Current Barriers:  . Chronic Disease Management support and education needs related to COPD & shortness of breath  Nurse Case Manager Clinical Goal(s):  [name] Over the next 7 days, patient will see an improvement in acute respiratory symptoms related to COVID-19 infection and complicated by chronic lung disease.  . Over the next 7 days, patient will f/u with healthcare team as needed . Over the next 14 days, patient will obtain a home pulse oximeter for personal use  Interventions:  . Chart reviewed . Collaborated with PCP clinical team . Order for home pulse ox obtained and records printed to support medical need . To be faxed to [facility]  Patient Self Care Activities:  . Performs ADL's independently . Performs IADL's independently . Unable to independently N/A  Please see past updates related to this goal by clicking on the "Past Updates" button in the selected goal      . Management of Covid Symptoms        Current Barriers:  [name] Knowledge Deficits related to Covid-19 diagnosis and condition management  Nurse Case Manager Clinical Goal(s):  [name] Over the next 24 hours, patient will have a visit with provider to discuss symptoms . Over the next 10 days, patient will see improvement in Covid-19 symptoms  Interventions:  . Provided education re: Covid . Appt scheduled with provider for televisit . Encouraged patient to f/u as needed and seek emergency medical care if necessary  Patient Self Care Activities:  . Performs ADL's independently . Performs IADL's independently . Unable to independently n/a  Initial goal documentation     . 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   Nurse Case Manager Clinical Goal(s):  [name] Over the next 30 days, patient will meet with RN Care Manager to address prescription assistance form completion  for 2021  Interventions:  . Chart reviewed . RN will assist patient with completing [employer] Patient Assistance forms for 2021 . Requested that patient complete his portion and then mail or drop-off portion for provider to complete. Wait until well from Covid first.   Patient Self Care Activities:  . Performs ADL's independently . Performs IADL's independently . Unable to independently n/a  Initial goal documentation       Follow-up Plan  The care management team will reach out to the patient again over the next 7 days.    [name], BSN, RN-BC Embedded Chronic Care Manager [facility] / [place] [facility] Direct Dial: [phone]

## 2018-12-11 NOTE — Progress Notes (Signed)
Telephone visit  Subjective: CC: COVID19 PCP: [name], [name], MD PXT:GGYIRS D Ma is a 73 y.o. male calls for telephone consult today. Patient provides verbal consent for consult held via phone.  Location of patient: home Location of provider: Working remotely from home Others present for call: none  1.  Cough Patient with ongoing intermittently productive cough that started over a week ago.  He was seen in urgent care on 12/05/2018 and had a chest x-ray and coronavirus test.  He tested positive for coronavirus.  Chest x-ray was negative for acute pulmonary infiltrates.  He is status post treatment with 5 days of prednisone.  He continues to use his inhalers and Tessalon Perles as prescribed.  He notes that the mucus appears thick and brownish-red.  He denies any overt wheezes but reports chills.  He reports feeling weak.  He has intermittent wheezes that is relieved by his inhaler.  Shortness of breath is stable and at baseline.   ROS: Per HPI  Allergies  Allergen Reactions  . Amlodipine Besy-Benazepril Hcl Swelling and Other (See Comments)    Makes tongue swell (lotrel)  . Oxycodone Itching  . Phenergan [Promethazine Hcl] Other (See Comments)    "I can't remember."  . Hydrocodone Itching    Can tolerate in low doses   Past Medical History:  Diagnosis Date  . Allergy   . Asthma   . CAD (coronary artery disease)   . CHF (congestive heart failure) ([place])   . Diabetes mellitus   . Fibromyalgia   . GERD (gastroesophageal reflux disease)   . GI bleeding   . Gout   . Hyperlipidemia   . Hypertension   . Hypogonadism male   . Insomnia   . MRSA cellulitis   . Neuropathy   . Obesity   . Shortness of breath dyspnea    with exertion   . Sleep apnea    cpap- 14   . Wheezing    no asthma diagnosis    Current Outpatient Medications:  .  albuterol (PROVENTIL HFA;VENTOLIN HFA) 108 (90 Base) MCG/ACT inhaler, Inhale 2 puffs into the lungs every 6 (six) hours as needed  for wheezing or shortness of breath., Disp: 1 Inhaler, Rfl: 2 .  atorvastatin (LIPITOR) 40 MG tablet, Take 1 tablet by mouth once daily, Disp: 90 tablet, Rfl: 0 .  benzonatate (TESSALON) 200 MG capsule, Take 1 capsule (200 mg total) by mouth 3 (three) times daily as needed for up to 7 days for cough., Disp: 28 capsule, Rfl: 0 .  bisoprolol (ZEBETA) 5 MG tablet, Take 0.5 tablets (2.5 mg total) by mouth daily., Disp: 45 tablet, Rfl: 3 .  Blood Glucose Monitoring Suppl (ACCU-CHEK AVIVA) device, Use as instructed, Disp: 1 each, Rfl: 0 .  Blood Pressure Monitoring (BLOOD PRESSURE CUFF) MISC, 1 each by Does not apply route daily., Disp: 1 each, Rfl: 0 .  budesonide-formoterol (SYMBICORT) 160-4.5 MCG/ACT inhaler, Inhale 2 puffs into the lungs 2 (two) times daily., Disp: 1 Inhaler, Rfl: 3 .  Cholecalciferol (VITAMIN D PO), Take 5,000 Units by mouth every morning. , Disp: , Rfl:  .  clonazePAM (KLONOPIN) 0.5 MG tablet, Take 1 tablet (0.5 mg total) by mouth at bedtime as needed., Disp: 30 tablet, Rfl: 2 .  furosemide (LASIX) 40 MG tablet, For a weight of 270 or higher, take 80 mg twice daily. For a weight of 269 and lower, take 80 mg in the morning and 40 mg in the evening., Disp: 120 tablet, Rfl: 11 .  glimepiride (AMARYL) 4 MG tablet, TAKE 1 TABLET BY MOUTH WITH BREAKFAST, Disp: 90 tablet, Rfl: 0 .  glucose blood (ACCU-CHEK AVIVA) test strip, 1 each by Other route daily. Use as instructed, Disp: 100 each, Rfl: 12 .  hydrALAZINE (APRESOLINE) 10 MG tablet, Take 1 tablet (10 mg total) by mouth 3 (three) times daily., Disp: 270 tablet, Rfl: 3 .  isosorbide dinitrate (ISORDIL) 30 MG tablet, Take 0.5 tablets (15 mg total) by mouth 3 (three) times daily., Disp: 90 tablet, Rfl: 3 .  Lancets (ACCU-CHEK SOFT TOUCH) lancets, 1 each by Other route daily. Use as instructed, Disp: 100 each, Rfl: 12 .  lisinopril (ZESTRIL) 10 MG tablet, Take 1 tablet (10 mg total) by mouth daily., Disp: 90 tablet, Rfl: 3 .  Misc. Devices  (PULSE OXIMETER) MISC, 1 each by Does not apply route continuous as needed., Disp: 1 each, Rfl: 0 .  mometasone-formoterol (DULERA) 100-5 MCG/ACT AERO, Inhale 2 puffs into the lungs 2 (two) times daily., Disp: , Rfl:  .  ONETOUCH DELICA LANCETS 98X MISC, 1 each by Does not apply route daily. Test 1X per day and prn, Disp: 100 each, Rfl: 2 .  OXYGEN, Inhale 2 L into the lungs at bedtime. 2lpm with sleep , Disp: , Rfl:  .  Potassium Chloride ER 20 MEQ TBCR, Take 1 tablet by mouth daily., Disp: 90 tablet, Rfl: 3 .  PRODIGY NO CODING BLOOD GLUC test strip, CHECK BLOOD SUGARS ONCE DAILY, Disp: 50 each, Rfl: 11 .  sertraline (ZOLOFT) 50 MG tablet, Take 1 tablet (50 mg total) by mouth daily., Disp: 90 tablet, Rfl: 1 .  zolpidem (AMBIEN CR) 12.5 MG CR tablet, Take 1 tablet (12.5 mg total) by mouth at bedtime as needed. for sleep, Disp: 30 tablet, Rfl: 0  Assessment/ Plan: 73 y.o. male   1. COVID-19 with pulmonary comorbidity Recent diagnosis on 12/05/2018.  He is status post treatment with oral corticosteroids which I believe is appropriate given underlying COPD.  We discussed continuing inhalers as prescribed.  Have added azithromycin.  I reviewed his last chest x-ray and EKG. QTC borderline. Precautions reviewed.  Home care instructions reviewed and reasons for emergent evaluation emergency department discussed.  He voiced good understanding will follow-up as needed - azithromycin (ZITHROMAX Z-PAK) 250 MG tablet; As directed  Dispense: 6 tablet; Refill: 0   Start time 4:04pm End time: 4:13pm  Total time spent on patient care (including telephone call/ virtual visit): 15 minutes  [place], [place] [phone]

## 2018-12-11 NOTE — Chronic Care Management (AMB) (Signed)
Chronic Care Management   Follow Up Note   12/11/2018 Name: Kenneth Fuller MRN: [id number] DOB: 11-Feb-1945  Referred by: [name], [name], MD Reason for referral : Chronic Care Management (Care Coordination)   Kenneth Fuller is a 73 y.o. year old male who is a primary care patient of [name], [name], MD. The CCM team was consulted for assistance with chronic disease management and care coordination needs.    Review of patient status, including review of consultants reports, relevant laboratory and other test results, and collaboration with appropriate care team members and the patient's provider was performed as part of comprehensive patient evaluation and provision of chronic care management services.    I spoke with Kenneth Fuller by telephone today. We are trying to order a home pulse oximeter that he can use to monitor his oxygen levels. Updated patient on status of order. Patient continuous to have a cough that is becoming more productive. Reports heavy, green phlegm. 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  Outpatient Encounter Medications as of 12/11/2018  Medication Sig  . albuterol (PROVENTIL HFA;VENTOLIN HFA) 108 (90 Base) MCG/ACT inhaler Inhale 2 puffs into the lungs every 6 (six) hours as needed for wheezing or shortness of breath.  [name] atorvastatin (LIPITOR) 40 MG tablet Take 1 tablet by mouth once daily  . benzonatate (TESSALON) 200 MG capsule Take 1 capsule (200 mg total) by mouth 3 (three) times daily as needed for up to 7 days for cough.  . bisoprolol (ZEBETA) 5 MG tablet Take 0.5 tablets (2.5 mg total) by mouth daily.  . Blood Glucose Monitoring Suppl (ACCU-CHEK AVIVA) device Use as instructed  . Blood Pressure Monitoring (BLOOD PRESSURE CUFF) MISC 1 each by Does not apply route daily.  . budesonide-formoterol (SYMBICORT) 160-4.5 MCG/ACT inhaler Inhale 2 puffs into the lungs 2 (two) times daily.  . Cholecalciferol (VITAMIN D PO) Take 5,000  Units by mouth every morning.   . clonazePAM (KLONOPIN) 0.5 MG tablet Take 1 tablet (0.5 mg total) by mouth at bedtime as needed.  . furosemide (LASIX) 40 MG tablet For a weight of 270 or higher, take 80 mg twice daily. For a weight of 269 and lower, take 80 mg in the morning and 40 mg in the evening.  [name] glimepiride (AMARYL) 4 MG tablet TAKE 1 TABLET BY MOUTH WITH BREAKFAST  . glucose blood (ACCU-CHEK AVIVA) test strip 1 each by Other route daily. Use as instructed  . hydrALAZINE (APRESOLINE) 10 MG tablet Take 1 tablet (10 mg total) by mouth 3 (three) times daily.  . isosorbide dinitrate (ISORDIL) 30 MG tablet Take 0.5 tablets (15 mg total) by mouth 3 (three) times daily.  . Lancets (ACCU-CHEK SOFT TOUCH) lancets 1 each by Other route daily. Use as instructed  . lisinopril (ZESTRIL) 10 MG tablet Take 1 tablet (10 mg total) by mouth daily.  . Misc. Devices (PULSE OXIMETER) MISC 1 each by Does not apply route continuous as needed.  . mometasone-formoterol (DULERA) 100-5 MCG/ACT AERO Inhale 2 puffs into the lungs 2 (two) times daily.  [name] DELICA LANCETS 46N MISC 1 each by Does not apply route daily. Test 1X per day and prn  . OXYGEN Inhale 2 L into the lungs at bedtime. 2lpm with sleep   . Potassium Chloride ER 20 MEQ TBCR Take 1 tablet by mouth daily.  [name] PRODIGY NO CODING BLOOD GLUC test strip CHECK BLOOD SUGARS ONCE DAILY  . sertraline (ZOLOFT) 50 MG tablet Take 1 tablet (  50 mg total) by mouth daily.  [name] zolpidem (AMBIEN CR) 12.5 MG CR tablet Take 1 tablet (12.5 mg total) by mouth at bedtime as needed. for sleep   No facility-administered encounter medications on file as of 12/11/2018.     CCM Care Plan     . COPD Symptom Management       Current Barriers:  . Chronic Disease Management support and education needs related to COPD & shortness of breath  Nurse Case Manager Clinical Goal(s):  [name] Over the next 7 days, patient will see an improvement in acute respiratory symptoms related to  COVID-19 infection and complicated by chronic lung disease.  . Over the next 7 days, patient will f/u with healthcare team as needed . Over the next 14 days, patient will obtain a home pulse oximeter for personal use  Interventions:  . Chart reviewed . Collaborated with PCP clinical team . Order for home pulse ox obtained and records printed to support medical need . To be faxed to [place]  Patient Self Care Activities:  . Performs ADL's independently . Performs IADL's independently . Unable to independently N/A  Please see past updates related to this goal by clicking on the "Past Updates" button in the selected goal      . Management of Covid Symptoms        Current Barriers:  [name] Knowledge Deficits related to Covid-19 diagnosis and condition management  Nurse Case Manager Clinical Goal(s):  [name] Over the next 24 hours, patient will have a visit with provider to discuss symptoms . Over the next 10 days, patient will see improvement in Covid-19 symptoms  Interventions:  . Provided education re: Covid . Appt scheduled with provider for televisit . Encouraged patient to f/u as needed and seek emergency medical care if necessary  Patient Self Care Activities:  . Performs ADL's independently . Performs IADL's independently . Unable to independently n/a  Initial goal documentation     . 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   Nurse Case Manager Clinical Goal(s):  [name] Over the next 30 days, patient will meet with RN Care Manager to address prescription assistance form completion for 2021  Interventions:  . Chart reviewed . RN will assist patient with completing [employer] Patient Assistance forms for 2021 . Requested that patient complete his portion and then mail or drop-off portion for provider to complete. Wait until well from Covid first.   Patient Self Care Activities:  .  Performs ADL's independently . Performs IADL's independently . Unable to independently n/a  Initial goal documentation       Follow-up Plan  The care management team will reach out to the patient again over the next 7 days.    [name], BSN, RN-BC Embedded Chronic Care Manager [facility] Direct Dial: [phone]

## 2018-12-12 DIAGNOSIS — R0902 Hypoxemia: Secondary | ICD-10-CM | POA: Diagnosis not present

## 2018-12-12 DIAGNOSIS — R0600 Dyspnea, unspecified: Secondary | ICD-10-CM | POA: Diagnosis not present

## 2018-12-12 DIAGNOSIS — I5032 Chronic diastolic (congestive) heart failure: Secondary | ICD-10-CM | POA: Diagnosis not present

## 2018-12-18 DIAGNOSIS — R0902 Hypoxemia: Secondary | ICD-10-CM | POA: Diagnosis not present

## 2018-12-18 DIAGNOSIS — R0600 Dyspnea, unspecified: Secondary | ICD-10-CM | POA: Diagnosis not present

## 2018-12-20 ENCOUNTER — Ambulatory Visit: Payer: PPO | Admitting: *Deleted

## 2018-12-21 ENCOUNTER — Ambulatory Visit: Payer: Self-pay | Admitting: Licensed Clinical Social Worker

## 2018-12-21 DIAGNOSIS — G4700 Insomnia, unspecified: Secondary | ICD-10-CM

## 2018-12-21 DIAGNOSIS — E119 Type 2 diabetes mellitus without complications: Secondary | ICD-10-CM

## 2018-12-21 DIAGNOSIS — J9611 Chronic respiratory failure with hypoxia: Secondary | ICD-10-CM

## 2018-12-21 DIAGNOSIS — F411 Generalized anxiety disorder: Secondary | ICD-10-CM

## 2018-12-21 DIAGNOSIS — K219 Gastro-esophageal reflux disease without esophagitis: Secondary | ICD-10-CM

## 2018-12-21 DIAGNOSIS — I10 Essential (primary) hypertension: Secondary | ICD-10-CM

## 2018-12-21 DIAGNOSIS — I5042 Chronic combined systolic (congestive) and diastolic (congestive) heart failure: Secondary | ICD-10-CM

## 2018-12-21 DIAGNOSIS — J449 Chronic obstructive pulmonary disease, unspecified: Secondary | ICD-10-CM

## 2018-12-21 NOTE — Chronic Care Management (AMB) (Signed)
Care Management Note   Kenneth Fuller is a 73 y.o. year old male who is a primary care patient of [name], [name], MD. The CM team was consulted for assistance with chronic disease management and care coordination.   I reached out to [name] by phone today.   Review of patient status, including review of consultants reports, relevant laboratory and other test results, and collaboration with appropriate care team members and the patient's provider was performed as part of comprehensive patient evaluation and provision of chronic care management services.   Social determinants of health: risk of tobacco use; risk of physical inactivity    Office Visit from 12/14/2017 in [facility]  PHQ-9 Total Score  12      GAD 7 : Generalized Anxiety Score 11/11/2017  Nervous, Anxious, on Edge 3  Control/stop worrying 3  Worry too much - different things 3  Trouble relaxing 3  Restless 3  Easily annoyed or irritable 3  Afraid - awful might happen 0  Total GAD 7 Score 18  Anxiety Difficulty Somewhat difficult   Medications   New medications from outside sources are available for reconciliation   albuterol (PROVENTIL HFA;VENTOLIN HFA) 108 (90 Base) MCG/ACT inhaler    atorvastatin (LIPITOR) 40 MG tablet    azithromycin (ZITHROMAX Z-PAK) 250 MG tablet    bisoprolol (ZEBETA) 5 MG tablet    Blood Glucose Monitoring Suppl (ACCU-CHEK AVIVA) device    Blood Pressure Monitoring (BLOOD PRESSURE CUFF) MISC    budesonide-formoterol (SYMBICORT) 160-4.5 MCG/ACT inhaler    Cholecalciferol (VITAMIN D PO)    clonazePAM (KLONOPIN) 0.5 MG tablet    furosemide (LASIX) 40 MG tablet    glimepiride (AMARYL) 4 MG tablet    glucose blood (ACCU-CHEK AVIVA) test strip    hydrALAZINE (APRESOLINE) 10 MG tablet    isosorbide dinitrate (ISORDIL) 30 MG tablet    Lancets (ACCU-CHEK SOFT TOUCH) lancets    lisinopril (ZESTRIL) 10 MG tablet    Misc. Devices (PULSE OXIMETER) MISC    mometasone-formoterol (DULERA) 100-5 MCG/ACT AERO    ONETOUCH DELICA LANCETS 72Q MISC    OXYGEN    Potassium Chloride ER 20 MEQ TBCR    PRODIGY NO CODING BLOOD GLUC test strip    sertraline (ZOLOFT) 50 MG tablet    zolpidem (AMBIEN CR) 12.5 MG CR tablet     Goals    . Client has stress related to ongoing health issues and wishes to talk more about stress over current health issues (pt-stated)     Current Barriers:  [name] Mental Health Concerns    .  Challenges related to managing health issues of client   Clinical Social Work Clinical Goal(s):  [name] Over the next 30 days, client will work with LCSW to address concerns related to health issues of client and client management of health issues faced  Interventions:    . Encouraged client to communicate with RNCM to discuss nursing needs of client . Talked with client about his management of health issues faced by client . Talked with client about ambulation challenges of client  . Talked with client about pain issues of client   Talked with client about breathing issues of client   . Patient Self Care Activities:  . Self administers medications as prescribed . Attends all scheduled provider appointments . Performs ADL's independently   Plan:  LCSW to call client in next 3 weeks to talk with client about stress issues related to his management of  health issues faced Client to communicate with RNCM to discuss nursing needs of client Client to attend scheduled client medical appointments   Initial goal documentation        Follow Up Plan: LCSW to call client in next 3 weeks to talk with client about stress issues related to his management of health  issues faced  [name]. Sherika Kubicki MSW, LCSW Licensed Clinical Social Worker [facility] [phone]

## 2018-12-21 NOTE — Patient Instructions (Addendum)
Licensed Clinical Social Worker Visit Information  Goals we discussed today:           . Client has stress related to ongoing health issues and wishes to talk more about stress over current health issues (pt-stated)     Current Barriers:  [name] Mental Health Concerns    .  Challenges related to managing health issues of client   Clinical Social Work Clinical Goal(s):  [name] Over the next 30 days, client will work with LCSW to address concerns related to health issues of client and client management of health issues faced  Interventions:  . Encouraged client to communicate with RNCM to discuss nursing needs of client . Talked with client about his management of health issues faced by client  Talked with client about ambulation challenges of client   Talked with client about pain issues of client   Talked with client about breathing issues of client  Patient Self Care Activities:  . Self administers medications as prescribed . Attends all scheduled provider appointments . Performs ADL's independently   Plan:  LCSW to call client in next 3 weeks to talk with client about stress issues related to his management of health issues faced Client to communicate with RNCM to discuss nursing needs of client Client to attend scheduled client medical appointments   Initial goal documentation                    Materials Provided: No  Follow Up Plan: LCSW to call client in next 3 weeks to talk with client about stress issues related  to management of health issues faced  The patient verbalized understanding of instructions provided today and declined a print copy of patient instruction materials.   [name].Kelcey Wickstrom MSW, LCSW Licensed Clinical Social Worker [facility] [phone]

## 2018-12-22 ENCOUNTER — Telehealth: Payer: Self-pay

## 2018-12-22 NOTE — Telephone Encounter (Signed)
Okay thanks for the information, have him keep [place] in close touch and if anything worsens please let me know.

## 2018-12-22 NOTE — Telephone Encounter (Signed)
Nicki Reaper, our clinical social worker here, wanted me to let you know this patient has tested positive for Covid.  Scott spoke with him yesterday afternoon for chronic care management and in talking to him noticed he was slightly winded.  Patient is on oxygen at home.  He has purchased an 02 meter to keep track of his oxygen levels. Scott wanted to make you aware of his diagnosis.

## 2018-12-27 ENCOUNTER — Telehealth: Payer: PPO

## 2018-12-28 ENCOUNTER — Ambulatory Visit (INDEPENDENT_AMBULATORY_CARE_PROVIDER_SITE_OTHER): Payer: PPO | Admitting: Family Medicine

## 2018-12-28 ENCOUNTER — Encounter (HOSPITAL_COMMUNITY): Payer: Self-pay

## 2018-12-28 ENCOUNTER — Encounter: Payer: Self-pay | Admitting: Family Medicine

## 2018-12-28 ENCOUNTER — Ambulatory Visit (INDEPENDENT_AMBULATORY_CARE_PROVIDER_SITE_OTHER): Payer: PPO | Admitting: *Deleted

## 2018-12-28 ENCOUNTER — Emergency Department (HOSPITAL_COMMUNITY)
Admission: EM | Admit: 2018-12-28 | Discharge: 2018-12-29 | Disposition: A | Discharge status: Home / Self Care | Payer: PPO | Attending: Emergency Medicine | Admitting: Emergency Medicine

## 2018-12-28 ENCOUNTER — Other Ambulatory Visit: Payer: Self-pay

## 2018-12-28 VITALS — BP 157/93

## 2018-12-28 DIAGNOSIS — I2510 Atherosclerotic heart disease of native coronary artery without angina pectoris: Secondary | ICD-10-CM | POA: Diagnosis not present

## 2018-12-28 DIAGNOSIS — E785 Hyperlipidemia, unspecified: Secondary | ICD-10-CM | POA: Diagnosis not present

## 2018-12-28 DIAGNOSIS — N183 Chronic kidney disease, stage 3 unspecified: Secondary | ICD-10-CM | POA: Insufficient documentation

## 2018-12-28 DIAGNOSIS — U071 COVID-19: Secondary | ICD-10-CM | POA: Diagnosis not present

## 2018-12-28 DIAGNOSIS — G301 Alzheimer's disease with late onset: Secondary | ICD-10-CM | POA: Diagnosis not present

## 2018-12-28 DIAGNOSIS — I130 Hypertensive heart and chronic kidney disease with heart failure and stage 1 through stage 4 chronic kidney disease, or unspecified chronic kidney disease: Secondary | ICD-10-CM | POA: Diagnosis not present

## 2018-12-28 DIAGNOSIS — I5042 Chronic combined systolic (congestive) and diastolic (congestive) heart failure: Secondary | ICD-10-CM | POA: Diagnosis not present

## 2018-12-28 DIAGNOSIS — E1122 Type 2 diabetes mellitus with diabetic chronic kidney disease: Secondary | ICD-10-CM | POA: Insufficient documentation

## 2018-12-28 DIAGNOSIS — Z79899 Other long term (current) drug therapy: Secondary | ICD-10-CM | POA: Diagnosis not present

## 2018-12-28 DIAGNOSIS — F0280 Dementia in other diseases classified elsewhere without behavioral disturbance: Secondary | ICD-10-CM

## 2018-12-28 DIAGNOSIS — J449 Chronic obstructive pulmonary disease, unspecified: Secondary | ICD-10-CM | POA: Diagnosis not present

## 2018-12-28 DIAGNOSIS — Z7951 Long term (current) use of inhaled steroids: Secondary | ICD-10-CM | POA: Diagnosis not present

## 2018-12-28 DIAGNOSIS — I509 Heart failure, unspecified: Secondary | ICD-10-CM | POA: Insufficient documentation

## 2018-12-28 DIAGNOSIS — I10 Essential (primary) hypertension: Secondary | ICD-10-CM | POA: Diagnosis not present

## 2018-12-28 DIAGNOSIS — Z7984 Long term (current) use of oral hypoglycemic drugs: Secondary | ICD-10-CM | POA: Insufficient documentation

## 2018-12-28 DIAGNOSIS — R0602 Shortness of breath: Secondary | ICD-10-CM | POA: Diagnosis not present

## 2018-12-28 DIAGNOSIS — G4733 Obstructive sleep apnea (adult) (pediatric): Secondary | ICD-10-CM | POA: Diagnosis not present

## 2018-12-28 DIAGNOSIS — R739 Hyperglycemia, unspecified: Secondary | ICD-10-CM

## 2018-12-28 DIAGNOSIS — Z87891 Personal history of nicotine dependence: Secondary | ICD-10-CM | POA: Insufficient documentation

## 2018-12-28 DIAGNOSIS — R0609 Other forms of dyspnea: Secondary | ICD-10-CM

## 2018-12-28 DIAGNOSIS — E1165 Type 2 diabetes mellitus with hyperglycemia: Secondary | ICD-10-CM | POA: Diagnosis not present

## 2018-12-28 DIAGNOSIS — E119 Type 2 diabetes mellitus without complications: Secondary | ICD-10-CM

## 2018-12-28 MED ORDER — HYDROXYCHLOROQUINE SULFATE 200 MG PO TABS: 400.0000 mg | ORAL_TABLET | Freq: Every day | ORAL | 0 refills | Status: DC

## 2018-12-28 MED ORDER — PREDNISONE 10 MG PO TABS: ORAL_TABLET | ORAL | 0 refills | Status: DC

## 2018-12-28 MED ORDER — AZITHROMYCIN 250 MG PO TABS: ORAL_TABLET | ORAL | 0 refills | Status: DC

## 2018-12-28 NOTE — Patient Instructions (Signed)
Visit Information  Goals Addressed            This Visit's Progress     Patient Stated   . COPD Symptom Management (pt-stated)       Current Barriers:  . Chronic Disease Management support and education needs related to COPD & shortness of breath  Nurse Case Manager Clinical Goal(s):  [name] Over the next 7 days, patient will see an improvement in acute respiratory symptoms related to COVID-19 infection and complicated by chronic lung disease.  . Over the next 7 days, patient will f/u with healthcare team as needed . Over the next 14 days, patient will obtain a home pulse oximeter for personal use  Interventions:  . Discussed HPI with patient o SOB has improved o Still has a dry cough . Confirmed that he did obtain a home pulse ox o Checking SPO2 levels regularly and they are >90 on 2.5L O2 . Encouraged patient to continue medications . Patient provided with CCM contact information and encouraged to reach out as needed . Encouraged to all RN CCM or PCP at [phone] with any O2 levels that are below 90%    Patient Self Care Activities:  . Performs ADL's independently . Performs IADL's independently . Unable to independently N/A  Please see past updates related to this goal by clicking on the "Past Updates" button in the selected goal      . Hypertension Management (pt-stated)       "I don't know why my blood pressure is up and I forgot to mention it to the doctor during my telephone visit earlier today"  Current Barriers:  [name] Knowledge Deficits related to acute on chronic elevation of blood pressure . Chronic Disease Management support and education needs related to Hypertension  Nurse Case Manager Clinical Goal(s):  [name] Over the next 3 days, patient will work with Consulting civil engineer to return blood pressure readings to normal range  Interventions:  . Discussed HPI with patient o Reports headache for several days o Reports blood pressure of 159/119 at 930 this  morning o 157/93 during telephone call at 2:45 pm . Chart reviewed including relevant office notes and labs . Medication list reviewed with patient including lisinopril, bisoprolol, and hydralazine. Recent changes in dosage noted. . Requested that patient check and record blood pressure twice a day and PRN. Record any symptoms as well.  . D/C ibuprofen that he has been using for headache. Last had 674m early this morning.  . RN Will consult with PCP regarding blood pressure elevation . Advised patient to call PCP or provider on call at [phone] any worsening symptoms or if readings are above 150/100 . Advised to seek emergency medical attention if needed  Patient Self Care Activities:  . Performs ADL's independently . Performs IADL's independently  Initial goal documentation     . COMPLETED: Management of Covid Symptoms (pt-stated)        Current Barriers:  .Marland KitchenKnowledge Deficits related to Covid-19 diagnosis and condition management  Nurse Case Manager Clinical Goal(s):  .Marland KitchenOver the next 24 hours, patient will have a visit with provider to discuss symptoms . Over the next 10 days, patient will see improvement in Covid-19 symptoms  Interventions:  . Provided education re: Covid . Appt scheduled with provider for televisit . Encouraged patient to f/u as needed and seek emergency medical care if necessary  Patient Self Care Activities:  . Performs ADL's independently . Performs IADL's independently . Unable to independently  n/a  Initial goal documentation  Resolved: Patient continues to have a mild dry cough but other covid symptoms have resolved         Other   . Client will verbalize knowledge of self management of Hypertension as evidences by BP reading of 140/90 or less; or as defined by provider         The patient verbalized understanding of instructions provided today and declined a print copy of patient instruction materials.   The care management team  will reach out to the patient again over the next 1 days.   [name], BSN, RN-BC Embedded Chronic Care Manager [facility] Direct Dial: [phone]

## 2018-12-28 NOTE — Progress Notes (Signed)
Subjective:    Patient ID: Kenneth Fuller, male    DOB: 02/28/1945, 73 y.o.   MRN: [id number]   HPI: Kenneth Fuller is a 73 y.o. male presenting for cough and headache. Severe for 1 week. Behind eyes around to back of head. Nonproductive cough. Has COPD. Uses O2 at night 2.5 liters. Had positive COVID test on 12/05/2018.   PRN daytime 3 liters is all the time for now. Dyspneic with mild exertion.   Depression screen [facility] 2/9 11/15/2018 10/05/2018 08/11/2018 07/17/2018 04/24/2018  Decreased Interest 0 0 0 0 1  Down, Depressed, Hopeless 0 1 - 0 0  PHQ - 2 Score 0 1 0 0 1  Altered sleeping - - - - -  Tired, decreased energy - - - - -  Change in appetite - - - - -  Feeling bad or failure about yourself  - - - - -  Trouble concentrating - - - - -  Moving slowly or fidgety/restless - - - - -  Suicidal thoughts - - - - -  PHQ-9 Score - - - - -  Difficult doing work/chores - - - - -  Some recent data might be hidden     Relevant past medical, surgical, family and social history reviewed and updated as indicated.  Interim medical history since our last visit reviewed. Allergies and medications reviewed and updated.  ROS:  Review of Systems  Constitutional: Negative for fever.  Respiratory: Negative for shortness of breath.   Cardiovascular: Negative for chest pain.  Musculoskeletal: Negative for arthralgias.  Skin: Negative for rash.  Psychiatric/Behavioral: Positive for confusion (intermittent). Negative for dysphoric mood. The patient is not nervous/anxious.      Social History   Tobacco Use  Smoking Status Former Smoker  . Quit date: 02/08/1969  . Years since quitting: 49.9  Smokeless Tobacco Former Systems developer  . Quit date: 1971       Objective:     Wt Readings from Last 3 Encounters:  11/15/18 277 lb 3.2 oz (125.7 kg)  10/17/18 277 lb 8 oz (125.9 kg)  10/05/18 275 lb 9.6 oz (125 kg)     Exam deferred. Pt. Harboring due to COVID 19. Phone visit performed.    Assessment & Plan:   1. Diabetes mellitus type II, non insulin dependent ([place])   2. Late onset Alzheimer's disease without behavioral disturbance ([place])     Meds ordered this encounter  Medications  . hydroxychloroquine (PLAQUENIL) 200 MG tablet    Sig: Take 2 tablets (400 mg total) by mouth daily.    Dispense:  40 tablet    Refill:  0  . predniSONE (DELTASONE) 10 MG tablet    Sig: Take 5 daily for 2 days followed by 4,3,2 and 1 for 2 days each.    Dispense:  30 tablet    Refill:  0  . azithromycin (ZITHROMAX Z-PAK) 250 MG tablet    Sig: Take two right away Then one a day for the next 4 days.    Dispense:  6 each    Refill:  0    No orders of the defined types were placed in this encounter.     Diagnoses and all orders for this visit:  Diabetes mellitus type II, non insulin dependent ([place])  Late onset Alzheimer's disease without behavioral disturbance ([place])  Other orders -     hydroxychloroquine (PLAQUENIL) 200 MG tablet; Take 2 tablets (400 mg total) by mouth daily. -  predniSONE (DELTASONE) 10 MG tablet; Take 5 daily for 2 days followed by 4,3,2 and 1 for 2 days each. -     azithromycin (ZITHROMAX Z-PAK) 250 MG tablet; Take two right away Then one a day for the next 4 days.    Virtual Visit via telephone Note  I discussed the limitations, risks, security and privacy concerns of performing an evaluation and management service by telephone and the availability of in person appointments. The patient was identified with two identifiers. Pt.expressed understanding and agreed to proceed. Pt. Is at home. Dr. Livia Snellen is in his office.  Follow Up Instructions:   I discussed the assessment and treatment plan with the patient. The patient was provided an opportunity to ask questions and all were answered. The patient agreed with the plan and demonstrated an understanding of the instructions.   The patient was advised to call back or seek an in-person evaluation if the symptoms  worsen or if the condition fails to improve as anticipated.   Total minutes including chart review and phone contact time: 22   Follow up plan: No follow-ups on file.  Claretta Fraise, MD [place]

## 2018-12-28 NOTE — Chronic Care Management (AMB) (Signed)
Chronic Care Management   Follow Up Note   12/28/2018 Name: Kenneth Fuller MRN: [id number] DOB: 31-Aug-1945  Referred by: [name], [name], MD Reason for referral : No chief complaint on file.   Kenneth Fuller is a 73 y.o. year old male who is a primary care patient of [name], [name], MD. The CCM team was consulted for assistance with chronic disease management and care coordination needs.    Review of patient status, including review of consultants reports, relevant laboratory and other test results, and collaboration with appropriate care team members and the patient's provider was performed as part of comprehensive patient evaluation and provision of chronic care management services.    Subjective: I spoke with Kenneth Fuller by telephone today as part of a routine follow-up. He did have a telephone visit with Dr Livia Snellen this morning for a headache and continued dry cough s/p Covid recovery. His blood pressure was 159/119 this morning. He did not report his blood pressure elevation to Dr Livia Snellen. Reports that is normal is usually much lower, around 130/80.   Objective: BP Readings from Last 4 Encounters:  12/28/18 (!) 159/119 Patient Reported  12/28/18 (!) 157/93 Patient Reported  12/05/18 (!) 160/79   11/15/18 124/75  10/17/18 126/72   Plan from 11/15/18 visit with Dr [name]: Will cut his lisinopril in half to 10 mg and see if that does better for the lightheadedness and dizziness, keep his other medications on the same.   Notes/Plan from 10/17/2018 visit with Dr Sallyanne Kuster: Patient is currently on hydralazine 10 mg 3 times daily and isosorbide dinitrate 15 mg 3 times daily and lisinopril 20 and bisoprolol 2.5 mg, and their blood pressure today is 124/75.  Patient still has daily lightheadedness and dizziness especially when he stands up quickly, we have already pared back on some of his blood pressure medications but he still running on the lower end for him..      Outpatient Encounter Medications as of 12/28/2018  Medication Sig  . albuterol (PROVENTIL HFA;VENTOLIN HFA) 108 (90 Base) MCG/ACT inhaler Inhale 2 puffs into the lungs every 6 (six) hours as needed for wheezing or shortness of breath.  [name] atorvastatin (LIPITOR) 40 MG tablet Take 1 tablet by mouth once daily  . azithromycin (ZITHROMAX Z-PAK) 250 MG tablet Take two right away Then one a day for the next 4 days.  . bisoprolol (ZEBETA) 5 MG tablet Take 0.5 tablets (2.5 mg total) by mouth daily.  . Blood Glucose Monitoring Suppl (ACCU-CHEK AVIVA) device Use as instructed  . Blood Pressure Monitoring (BLOOD PRESSURE CUFF) MISC 1 each by Does not apply route daily.  . budesonide-formoterol (SYMBICORT) 160-4.5 MCG/ACT inhaler Inhale 2 puffs into the lungs 2 (two) times daily.  . Cholecalciferol (VITAMIN D PO) Take 5,000 Units by mouth every morning.   . clonazePAM (KLONOPIN) 0.5 MG tablet Take 1 tablet (0.5 mg total) by mouth at bedtime as needed.  . furosemide (LASIX) 40 MG tablet For a weight of 270 or higher, take 80 mg twice daily. For a weight of 269 and lower, take 80 mg in the morning and 40 mg in the evening.  [name] glimepiride (AMARYL) 4 MG tablet TAKE 1 TABLET BY MOUTH WITH BREAKFAST  . glucose blood (ACCU-CHEK AVIVA) test strip 1 each by Other route daily. Use as instructed  . hydrALAZINE (APRESOLINE) 10 MG tablet Take 1 tablet (10 mg total) by mouth 3 (three) times daily.  . hydroxychloroquine (PLAQUENIL) 200 MG tablet Take 2 tablets (  400 mg total) by mouth daily.  . isosorbide dinitrate (ISORDIL) 30 MG tablet Take 0.5 tablets (15 mg total) by mouth 3 (three) times daily.  . Lancets (ACCU-CHEK SOFT TOUCH) lancets 1 each by Other route daily. Use as instructed  . lisinopril (ZESTRIL) 10 MG tablet Take 1 tablet (10 mg total) by mouth daily.  . Misc. Devices (PULSE OXIMETER) MISC 1 each by Does not apply route continuous as needed.  . mometasone-formoterol (DULERA) 100-5 MCG/ACT AERO Inhale 2 puffs  into the lungs 2 (two) times daily.  [name] DELICA LANCETS 28N MISC 1 each by Does not apply route daily. Test 1X per day and prn  . OXYGEN Inhale 2 L into the lungs at bedtime. 2lpm with sleep   . Potassium Chloride ER 20 MEQ TBCR Take 1 tablet by mouth daily.  . predniSONE (DELTASONE) 10 MG tablet Take 5 daily for 2 days followed by 4,3,2 and 1 for 2 days each.  [name] PRODIGY NO CODING BLOOD GLUC test strip CHECK BLOOD SUGARS ONCE DAILY  . sertraline (ZOLOFT) 50 MG tablet Take 1 tablet (50 mg total) by mouth daily.  [name] zolpidem (AMBIEN CR) 12.5 MG CR tablet Take 1 tablet (12.5 mg total) by mouth at bedtime as needed. for sleep   No facility-administered encounter medications on file as of 12/28/2018.      RN Assessment & Care Plan   . COPD Symptom Management (pt-stated)       Current Barriers:  . Chronic Disease Management support and education needs related to COPD & shortness of breath  Nurse Case Manager Clinical Goal(s):  [name] Over the next 7 days, patient will see an improvement in acute respiratory symptoms related to COVID-19 infection and complicated by chronic lung disease.  . Over the next 7 days, patient will f/u with healthcare team as needed . Over the next 14 days, patient will obtain a home pulse oximeter for personal use  Interventions:  . Discussed HPI with patient o SOB has improved o Still has a dry cough . Confirmed that he did obtain a home pulse ox o Checking SPO2 levels regularly and they are >90 on 2.5L O2 . Encouraged patient to continue medications . Patient provided with CCM contact information and encouraged to reach out as needed . Encouraged to all RN CCM or PCP at [phone] with any O2 levels that are below 90%    Patient Self Care Activities:  . Performs ADL's independently . Performs IADL's independently . Unable to independently N/A  Please see past updates related to this goal by clicking on the "Past Updates" button in the selected goal       . Hypertension Management (pt-stated)       "I don't know why my blood pressure is up and I forgot to mention it to the doctor during my telephone visit earlier today"  Current Barriers:  [name] Knowledge Deficits related to acute on chronic elevation of blood pressure . Chronic Disease Management support and education needs related to Hypertension  Nurse Case Manager Clinical Goal(s):  [name] Over the next 3 days, patient will work with Consulting civil engineer to return blood pressure readings to normal range  Interventions:  . Discussed HPI with patient o Reports headache for several days o Reports blood pressure of 159/119 at 930 this morning o 157/93 during telephone call at 2:45 pm . Chart reviewed including relevant office notes and labs . Medication list reviewed with patient including lisinopril, bisoprolol, and hydralazine. Recent  changes in dosage noted. . Requested that patient check and record blood pressure twice a day and PRN. Record any symptoms as well.  . D/C ibuprofen that he has been using for headache. Last had 616m early this morning.  . RN reached out to PCP regarding blood pressure elevation and awaiting response . Advised patient to call PCP or provider on call at [phone] any worsening symptoms or if readings are above 150/100 . Advised to seek emergency medical attention if needed  Patient Self Care Activities:  . Performs ADL's independently . Performs IADL's independently  Initial goal documentation     . COMPLETED: Management of Covid Symptoms (pt-stated)        Current Barriers:  .Marland KitchenKnowledge Deficits related to Covid-19 diagnosis and condition management  Nurse Case Manager Clinical Goal(s):  .Marland KitchenOver the next 24 hours, patient will have a visit with provider to discuss symptoms . Over the next 10 days, patient will see improvement in Covid-19 symptoms  Interventions:  . Provided education re: Covid . Appt scheduled with provider for televisit .  Encouraged patient to f/u as needed and seek emergency medical care if necessary  Patient Self Care Activities:  . Performs ADL's independently . Performs IADL's independently . Unable to independently n/a  Initial goal documentation  Resolved: Patient continues to have a mild dry cough but other covid symptoms have resolved          Follow-up Plan RN Will f/u with patient over the next 24 hours Patient will seek medical attention as needed  KChong Sicilian BSN, RN-BC [place]/ TWitheeManagement Direct Dial: [phone]

## 2018-12-28 NOTE — ED Triage Notes (Signed)
SOB worsening over the past several days, states he called his doctor and not changes were made in his treatment.  Pt denies cp

## 2018-12-29 ENCOUNTER — Emergency Department (HOSPITAL_COMMUNITY): Payer: PPO

## 2018-12-29 ENCOUNTER — Ambulatory Visit: Payer: PPO | Admitting: *Deleted

## 2018-12-29 DIAGNOSIS — R0602 Shortness of breath: Secondary | ICD-10-CM | POA: Diagnosis not present

## 2018-12-29 DIAGNOSIS — J449 Chronic obstructive pulmonary disease, unspecified: Secondary | ICD-10-CM

## 2018-12-29 LAB — BASIC METABOLIC PANEL
Anion gap: 9 (ref 5–15)
BUN: 20 mg/dL (ref 8–23)
CO2: 24 mmol/L (ref 22–32)
Calcium: 8.7 mg/dL — ABNORMAL LOW (ref 8.9–10.3)
Chloride: 102 mmol/L (ref 98–111)
Creatinine, Ser: 1.22 mg/dL (ref 0.61–1.24)
GFR calc Af Amer: >60 mL/min (ref >60)
GFR calc non Af Amer: 59 mL/min — ABNORMAL LOW (ref >60)
Glucose, Bld: 358 mg/dL — ABNORMAL HIGH (ref 70–99)
Potassium: 4.1 mmol/L (ref 3.5–5.1)
Sodium: 135 mmol/L (ref 135–145)

## 2018-12-29 LAB — CBC WITH DIFFERENTIAL/PLATELET
Abs Immature Granulocytes: 0.04 10*3/uL (ref 0.00–0.07)
Basophils Absolute: 0 10*3/uL (ref 0.0–0.1)
Basophils Relative: 0 %
Eosinophils Absolute: 0 10*3/uL (ref 0.0–0.5)
Eosinophils Relative: 0 %
HCT: 38 % — ABNORMAL LOW (ref 39.0–52.0)
Hemoglobin: 11.8 g/dL — ABNORMAL LOW (ref 13.0–17.0)
Immature Granulocytes: 0 %
Lymphocytes Relative: 10 %
Lymphs Abs: 1 10*3/uL (ref 0.7–4.0)
MCH: 25.8 pg — ABNORMAL LOW (ref 26.0–34.0)
MCHC: 31.1 g/dL (ref 30.0–36.0)
MCV: 83.2 fL (ref 80.0–100.0)
Monocytes Absolute: 0.2 10*3/uL (ref 0.1–1.0)
Monocytes Relative: 2 %
Neutro Abs: 8.5 10*3/uL — ABNORMAL HIGH (ref 1.7–7.7)
Neutrophils Relative %: 88 %
Platelets: 258 10*3/uL (ref 150–400)
RBC: 4.57 MIL/uL (ref 4.22–5.81)
RDW: 16.9 % — ABNORMAL HIGH (ref 11.5–15.5)
WBC: 9.7 10*3/uL (ref 4.0–10.5)
nRBC: 0 % (ref 0.0–0.2)

## 2018-12-29 LAB — SARS CORONAVIRUS 2 (TAT 6-24 HRS): SARS Coronavirus 2: POSITIVE — AB

## 2018-12-29 LAB — BRAIN NATRIURETIC PEPTIDE: B Natriuretic Peptide: 187 pg/mL — ABNORMAL HIGH (ref 0.0–100.0)

## 2018-12-29 LAB — TROPONIN I (HIGH SENSITIVITY): Troponin I (High Sensitivity): 7 ng/L (ref <18)

## 2018-12-29 MED ORDER — ALBUTEROL SULFATE HFA 108 (90 BASE) MCG/ACT IN AERS
2.0000 | INHALATION_SPRAY | Freq: Once | RESPIRATORY_TRACT | Status: AC
  Administered 2018-12-29: 2 via RESPIRATORY_TRACT
  Filled 2018-12-29: qty 6.7

## 2018-12-29 NOTE — ED Notes (Signed)
Patient ambulated in room on 3 liters of oxygen. Patient is on 2 liters at home. Patient became very exerted from little movement during ambulation. Oxygen saturations on 3 liters dropped to 93 percent.

## 2018-12-29 NOTE — ED Provider Notes (Signed)
[facility] EMERGENCY DEPARTMENT Provider Note   CSN: [id number] Arrival date & time: 12/28/18  2303     History   Chief Complaint Chief Complaint  Patient presents with  . Shortness of Breath    HPI Kenneth Fuller is a 73 y.o. male.     The history is provided by the patient.  Shortness of Breath Severity:  Moderate Onset quality:  Gradual Duration:  5 days Timing:  Intermittent Progression:  Worsening Chronicity:  New Relieved by:  Nothing Worsened by:  Exertion Associated symptoms: chest pain and cough   Associated symptoms: no fever and no vomiting   Associated symptoms comment:  Chest soreness Patient with history of CAD, CHF, diabetes, fibromyalgia, oxygen dependence presents with shortness of breath Patient reports for the past 5 days she has had increasing shortness of breath, worse with exertion.  He reports some lower extremity edema.  He reports cough and some chest soreness earlier in the day.  No chest pain at this time.  No fevers. Patient was diagnosed with COVID-19 at the end of October He spoke to his PCP today who prescribed Plaquenil, azithromycin and prednisone Past Medical History:  Diagnosis Date  . Allergy   . Asthma   . CAD (coronary artery disease)   . CHF (congestive heart failure) ([place])   . Diabetes mellitus   . Fibromyalgia   . GERD (gastroesophageal reflux disease)   . GI bleeding   . Gout   . Hyperlipidemia   . Hypertension   . Hypogonadism male   . Insomnia   . MRSA cellulitis   . Neuropathy   . Obesity   . Shortness of breath dyspnea    with exertion   . Sleep apnea    cpap- 14   . Wheezing    no asthma diagnosis    Patient Active Problem List   Diagnosis Date Noted  . Chest pain 09/23/2018  . Acute on chronic diastolic CHF (congestive heart failure) ([place]) 09/23/2018  . Insomnia   . CHF (congestive heart failure) ([place]) 01/06/2018  . Depression with anxiety 01/04/2018  . CKD (chronic kidney disease), stage III  01/04/2018  . Chronic obstructive pulmonary disease ([place]) 01/04/2018  . Depression, recurrent ([place]) 11/11/2017  . Chronic diastolic heart failure ([place]) 04/13/2017  . Swelling of lower extremity 11/30/2016  . Obesity, Class III, BMI 40-49.9 (morbid obesity) ([place]) 08/21/2015  . Coronary artery disease involving native coronary artery of native heart without angina pectoris 12/05/2014  . Urinary urgency 10/15/2014  . Spinal stenosis, lumbar region, with neurogenic claudication 01/02/2014  . Hyperlipidemia associated with type 2 diabetes mellitus ([place]) 10/25/2013  . COLONIC POLYPS, ADENOMATOUS 02/25/2007  . Diabetes mellitus type II, non insulin dependent ([place]) 02/25/2007  . Gout 02/25/2007  . Essential hypertension 02/25/2007  . Cough variant asthma 02/25/2007  . OSA on CPAP 02/25/2007  . FATIGUE, CHRONIC 02/25/2007  . HEADACHE, CHRONIC 02/25/2007  . GERD (gastroesophageal reflux disease) 02/25/2007    Past Surgical History:  Procedure Laterality Date  . BACK SURGERY    . CARDIAC CATHETERIZATION    . COLONOSCOPY    . LEFT HEART CATH AND CORONARY ANGIOGRAPHY N/A 12/02/2016   Procedure: LEFT HEART CATH AND CORONARY ANGIOGRAPHY;  Surgeon: Jettie Booze, MD;  Location: [facility];  Service: Cardiovascular;  Laterality: N/A;  . LUMBAR LAMINECTOMY/DECOMPRESSION MICRODISCECTOMY N/A 01/02/2014   Procedure: CENTRAL DECOMPRESSION LUMBAR LAMINECTOMY L3-L4, L4-L5;  Surgeon: Tobi Bastos, MD;  Location: [facility];  Service: Orthopedics;  Laterality: N/A;  . neck fusion          Home Medications    Prior to Admission medications   Medication Sig Start Date End Date Taking? Authorizing Provider  albuterol (PROVENTIL HFA;VENTOLIN HFA) 108 (90 Base) MCG/ACT inhaler Inhale 2 puffs into the lungs every 6 (six) hours as needed for wheezing or shortness of breath. 07/20/17   Brand Males, MD  atorvastatin (LIPITOR) 40 MG tablet Take 1 tablet by mouth once daily 11/07/18    [name], [name], MD  azithromycin (ZITHROMAX Z-PAK) 250 MG tablet Take two right away Then one a day for the next 4 days. 12/28/18   Claretta Fraise, MD  bisoprolol (ZEBETA) 5 MG tablet Take 0.5 tablets (2.5 mg total) by mouth daily. 10/05/18   [name], [name], MD  Blood Glucose Monitoring Suppl (ACCU-CHEK AVIVA) device Use as instructed 04/18/18 04/18/19  [name], [name], MD  Blood Pressure Monitoring (BLOOD PRESSURE CUFF) MISC 1 each by Does not apply route daily. 05/22/18   [name], [name], MD  budesonide-formoterol (SYMBICORT) 160-4.5 MCG/ACT inhaler Inhale 2 puffs into the lungs 2 (two) times daily. 01/09/18   Barton Dubois, MD  Cholecalciferol (VITAMIN D PO) Take 5,000 Units by mouth every morning.     [provider]  clonazePAM (KLONOPIN) 0.5 MG tablet Take 1 tablet (0.5 mg total) by mouth at bedtime as needed. 10/05/18   [name], [name], MD  furosemide (LASIX) 40 MG tablet For a weight of 270 or higher, take 80 mg twice daily. For a weight of 269 and lower, take 80 mg in the morning and 40 mg in the evening. 10/17/18   Croitoru, Mihai, MD  glimepiride (AMARYL) 4 MG tablet TAKE 1 TABLET BY MOUTH WITH BREAKFAST 01/18/18   [name], [name], MD  glucose blood (ACCU-CHEK AVIVA) test strip 1 each by Other route daily. Use as instructed 04/18/18   [name], [name], MD  hydrALAZINE (APRESOLINE) 10 MG tablet Take 1 tablet (10 mg total) by mouth 3 (three) times daily. 10/05/18 10/05/19  [name], [name], MD  hydroxychloroquine (PLAQUENIL) 200 MG tablet Take 2 tablets (400 mg total) by mouth daily. 12/28/18   Claretta Fraise, MD  isosorbide dinitrate (ISORDIL) 30 MG tablet Take 0.5 tablets (15 mg total) by mouth 3 (three) times daily. 10/17/18   Croitoru, Mihai, MD  Lancets (ACCU-CHEK SOFT TOUCH) lancets 1 each by Other route daily. Use as instructed 04/18/18   [name], [name], MD  lisinopril (ZESTRIL) 10 MG tablet Take 1 tablet (10 mg total) by mouth daily. 11/15/18    [name], [name], MD  Misc. Devices (PULSE OXIMETER) MISC 1 each by Does not apply route continuous as needed. 07/26/18   [name], [name], MD  mometasone-formoterol (DULERA) 100-5 MCG/ACT AERO Inhale 2 puffs into the lungs 2 (two) times daily.    [provider]  [facility] DELICA LANCETS 41C MISC 1 each by Does not apply route daily. Test 1X per day and prn 03/30/17   [name], [name], MD  OXYGEN Inhale 2 L into the lungs at bedtime. 2lpm with sleep     [provider]  Potassium Chloride ER 20 MEQ TBCR Take 1 tablet by mouth daily. 08/08/18   Almyra Deforest, PA  predniSONE (DELTASONE) 10 MG tablet Take 5 daily for 2 days followed by 4,3,2 and 1 for 2 days each. 12/28/18   Claretta Fraise, MD  PRODIGY NO CODING BLOOD GLUC test strip CHECK BLOOD SUGARS ONCE DAILY 01/17/18  [name], [name], MD  sertraline (ZOLOFT) 50 MG tablet Take 1 tablet (50 mg total) by mouth daily. 10/05/18   [name], [name], MD  zolpidem (AMBIEN CR) 12.5 MG CR tablet Take 1 tablet (12.5 mg total) by mouth at bedtime as needed. for sleep 10/19/18   [name], [name], MD    Family History Family History  Problem Relation Age of Onset  . Colon cancer Mother   . Diabetes Father        siblings  . Heart disease Father        brother  . Heart attack Father   . Kidney disease Sister   . Heart failure Sister   . Heart disease Brother   . Heart attack Son 19  . Drug abuse Sister   . Heart disease Brother   . Deep vein thrombosis Brother   . Colon polyps Neg Hx     Social History Social History   Tobacco Use  . Smoking status: Former Smoker    Quit date: 02/08/1969    Years since quitting: 49.9  . Smokeless tobacco: Former Systems developer    Quit date: 1971  Substance Use Topics  . Alcohol use: No    Alcohol/week: 0.0 standard drinks  . Drug use: No     Allergies   Amlodipine besy-benazepril hcl, Oxycodone, Phenergan [promethazine hcl], and Hydrocodone   Review of Systems Review of Systems   Constitutional: Positive for chills. Negative for fever.  Respiratory: Positive for cough and shortness of breath.   Cardiovascular: Positive for chest pain and leg swelling.  Gastrointestinal: Negative for vomiting.  All other systems reviewed and are negative.    Physical Exam Updated Vital Signs BP (!) 181/96 (BP Location: Right Arm)   Pulse 85   Temp 98.8 F (37.1 C) (Oral)   Resp (!) 21   Ht 1.702 m (_0 )   Wt 124.7 kg   SpO2 100%   BMI 43.07 kg/m   Physical Exam  CONSTITUTIONAL: Well developed/well nourished, mildly anxious HEAD: Normocephalic/atraumatic EYES: EOMI ENMT: Mucous membranes moist NECK: supple no meningeal signs SPINE/BACK:entire spine nontender CV: S1/S2 noted, no murmurs/rubs/gallops noted LUNGS: Scattered wheezing bilaterally no apparent distress ABDOMEN: soft, nontender, no rebound or guarding, bowel sounds noted throughout abdomen GU:no cva tenderness NEURO: Pt is awake/alert/appropriate, moves all extremitiesx4.  No facial droop.   EXTREMITIES: pulses normal/equal, full ROM, minimal pitting edema lower extremities SKIN: warm, color normal PSYCH: Anxious  ED Treatments / Results  Labs (all labs ordered are listed, but only abnormal results are displayed) Labs Reviewed  BASIC METABOLIC PANEL - Abnormal; Notable for the following components:      Result Value   Glucose, Bld 358 (*)    Calcium 8.7 (*)    GFR calc non Af Amer 59 (*)    All other components within normal limits  CBC WITH DIFFERENTIAL/PLATELET - Abnormal; Notable for the following components:   Hemoglobin 11.8 (*)    HCT 38.0 (*)    MCH 25.8 (*)    RDW 16.9 (*)    Neutro Abs 8.5 (*)    All other components within normal limits  BRAIN NATRIURETIC PEPTIDE - Abnormal; Notable for the following components:   B Natriuretic Peptide 187.0 (*)    All other components within normal limits  SARS CORONAVIRUS 2 (TAT 6-24 HRS)  TROPONIN I (HIGH SENSITIVITY)    EKG EKG  Interpretation  Date/Time:  Thursday December 28 2018 23:28:46 EST Ventricular Rate:  83 PR Interval:  QRS Duration: 107 QT Interval:  411 QTC Calculation: 483 R Axis:   60 Text Interpretation: Sinus rhythm Borderline prolonged QT interval Baseline wander in lead(s) I II aVR Confirmed by Ripley Fraise [phone]) on 12/28/2018 11:59:41 PM   Radiology Dg Chest Port 1 View  Result Date: 12/29/2018 CLINICAL DATA:  Worsening shortness of breath for 1 week. COVID-19 positive 12/05/2018. EXAM: PORTABLE CHEST 1 VIEW COMPARISON:  Radiograph 12/05/2018 FINDINGS: Borderline mild cardiomegaly. Minimal vague opacities in the bilateral lower lung zones. No pulmonary edema, large pleural effusion, or pneumothorax. Surgical hardware in the lower cervical spine partially included. IMPRESSION: Minimal vague opacities in the bilateral lower lung zones, may be atelectasis or pneumonia in this setting. Electronically Signed   By: Keith Rake M.D.   On: 12/29/2018 00:18    Procedures Procedures    Medications Ordered in ED Medications  albuterol (VENTOLIN HFA) 108 (90 Base) MCG/ACT inhaler 2 puff (2 puffs Inhalation Given 12/29/18 0013)     Initial Impression / Assessment and Plan / ED Course  I have reviewed the triage vital signs and the nursing notes.  Pertinent labs & imaging results that were available during my care of the patient were reviewed by me and considered in my medical decision making (see chart for details).        12:05 AM Patient presents with shortness of breath.  Patient was already diagnosed with COVID-19 less than a month ago.  Will defer further testing for Covid, but will proceed with x-ray and labs.  He has scattered wheezing and will give albuterol 1:36 AM Overall patient is improved. He is sitting in his chair in no acute distress.  Pulse ox dropped to 93% while walking on his home oxygen.  Nursing reported he appears exerted, but patient reports he feels near  baseline.  He prefers to be discharged home. He would like repeat testing for COVID-19, but I cautioned him that it will likely remain positive as he had a positive test less than a month ago No signs of acute heart failure.  Residual infiltrates on x-ray, but no other acute finding Low suspicion for ACS/PE. He was already placed on Plaquenil/Z-Pak/prednisone by PCP.  I cautioned him about the use of prednisone with his hyperglycemia. Patient overall stable for discharge  DAXEN LANUM was evaluated in Emergency Department on 12/29/2018 for the symptoms described in the history of present illness. He was evaluated in the context of the global COVID-19 pandemic, which necessitated consideration that the patient might be at risk for infection with the SARS-CoV-2 virus that causes COVID-19. Institutional protocols and algorithms that pertain to the evaluation of patients at risk for COVID-19 are in a state of rapid change based on information released by regulatory bodies including the CDC and federal and state organizations. These policies and algorithms were followed during the patient's care in the ED.   Final Clinical Impressions(s) / ED Diagnoses   Final diagnoses:  Dyspnea on exertion  Hyperglycemia  Chronic obstructive pulmonary disease, unspecified COPD type [facility])    ED Discharge Orders    None       Ripley Fraise, MD 12/29/18 [phone]

## 2019-01-03 ENCOUNTER — Ambulatory Visit: Payer: PPO | Admitting: Family Medicine

## 2019-01-08 ENCOUNTER — Telehealth: Payer: Self-pay | Admitting: Family Medicine

## 2019-01-08 MED ORDER — CEFDINIR 300 MG PO CAPS: 300.0000 mg | ORAL_CAPSULE | Freq: Two times a day (BID) | ORAL | 0 refills | Status: DC

## 2019-01-08 NOTE — Telephone Encounter (Signed)
Patient requesting medication to help with symptoms due to covid.

## 2019-01-08 NOTE — Telephone Encounter (Signed)
Please let her know that I sent in cefdinir for him, this is an antibiotic, see if I can help, notes a viral illness but it could possibly lower things down.  If it worsens or does not improve then give [place] a call.

## 2019-01-08 NOTE — Telephone Encounter (Signed)
lmtcb

## 2019-01-08 NOTE — Telephone Encounter (Signed)
What symptoms do you have? Terrible headache and sore throat  How long have you been sick? 12/29/2018 tested pos for COVID  Have you been seen for this problem? No at the ED 12/28/2018  If your provider decides to give you a prescription, which pharmacy would you like for it to be sent to? [facility]   Patient informed that this information will be sent to the clinical staff for review and that they should receive a follow up call.  3

## 2019-01-09 NOTE — Telephone Encounter (Signed)
Patient is aware that antibiotic has been sent in.  He will pick it up and begin taking.

## 2019-01-11 DIAGNOSIS — R0600 Dyspnea, unspecified: Secondary | ICD-10-CM | POA: Diagnosis not present

## 2019-01-11 DIAGNOSIS — R0902 Hypoxemia: Secondary | ICD-10-CM | POA: Diagnosis not present

## 2019-01-11 DIAGNOSIS — I5032 Chronic diastolic (congestive) heart failure: Secondary | ICD-10-CM | POA: Diagnosis not present

## 2019-01-12 ENCOUNTER — Telehealth: Payer: Self-pay | Admitting: Family Medicine

## 2019-01-12 NOTE — Telephone Encounter (Signed)
Advised pt he has a televisit with Scott on Monday 01/15/19 at 11:30.

## 2019-01-12 NOTE — Telephone Encounter (Signed)
Patient says he was just talking with nurse and would like call back.

## 2019-01-15 ENCOUNTER — Ambulatory Visit (INDEPENDENT_AMBULATORY_CARE_PROVIDER_SITE_OTHER): Payer: PPO | Admitting: Licensed Clinical Social Worker

## 2019-01-15 ENCOUNTER — Other Ambulatory Visit: Payer: Self-pay

## 2019-01-15 DIAGNOSIS — F339 Major depressive disorder, recurrent, unspecified: Secondary | ICD-10-CM

## 2019-01-15 DIAGNOSIS — F411 Generalized anxiety disorder: Secondary | ICD-10-CM

## 2019-01-15 DIAGNOSIS — E119 Type 2 diabetes mellitus without complications: Secondary | ICD-10-CM

## 2019-01-15 DIAGNOSIS — I10 Essential (primary) hypertension: Secondary | ICD-10-CM | POA: Diagnosis not present

## 2019-01-15 DIAGNOSIS — I5042 Chronic combined systolic (congestive) and diastolic (congestive) heart failure: Secondary | ICD-10-CM

## 2019-01-15 NOTE — Patient Instructions (Addendum)
Licensed Clinical Social Worker Visit Information  Materials Provided:  No  Goals    . Client has stress related to ongoing health issues and wishes to talk more about stress over current health issues (pt-stated)       Current Barriers:   Mental Health Concerns of client with chronic diagnoses of HTN, GERD, DM type 2, OSA .   Challenges related to managing health issues of client   Clinical Social Work Clinical Goal(s):  [name] Over the next 30 days, client will work with LCSW to address concerns related to health issues of client and client management of health issues faced  Interventions:  Encouraged client to communicate with RNCM to discuss nursing needs of client  Talked with client about his management of health issues faced by client  Talked with client about ambulation challenges of client   Talked with client about pain issues of client   Talked with client about breathing issues of client  Talked with client about client's upcoming client appointments  Patient Self Care Activities:  . Self administers medications as prescribed . Attends all scheduled provider appointments . Performs ADL's independently   Plan:  LCSW to call client in next 3 weeks to talk with client about stress issues related to his management of health issues faced Client to communicate with RNCM to discuss nursing needs of client Client to attend scheduled client medical appointments   Initial goal documentation              Follow Up Plan: LCSW to call client in next 3 weeks to talk with client about stress issues related to management of health issues faced  The patient verbalized understanding of instructions provided today and declined a print copy of patient instruction materials.   [name].Myra Weng MSW, LCSW Licensed Clinical Social Worker [facility] [phone]

## 2019-01-15 NOTE — Chronic Care Management (AMB) (Signed)
Care Management Note   Kenneth Fuller is a 73 y.o. year old male who is a primary care patient of [name], [name], MD. The CM team was consulted for assistance with chronic disease management and care coordination.   I reached out to [name] via phone today   Review of patient status, including review of consultants reports, relevant laboratory and other test results, and collaboration with appropriate care team members and the patient's provider was performed as part of comprehensive patient evaluation and provision of chronic care management services.   Social Determinants of Health: risk of tobacco use; risk of physical inactivity   Office Visit from 12/14/2017 in [place]  PHQ-9 Total Score  12     GAD 7 : Generalized Anxiety Score 11/11/2017  Nervous, Anxious, on Edge 3  Control/stop worrying 3  Worry too much - different things 3  Trouble relaxing 3  Restless 3  Easily annoyed or irritable 3  Afraid - awful might happen 0  Total GAD 7 Score 18  Anxiety Difficulty Somewhat difficult   Medications   New medications from outside sources are available for reconciliation   albuterol (PROVENTIL HFA;VENTOLIN HFA) 108 (90 Base) MCG/ACT inhaler    atorvastatin (LIPITOR) 40 MG tablet    azithromycin (ZITHROMAX Z-PAK) 250 MG tablet    bisoprolol (ZEBETA) 5 MG tablet    Blood Glucose Monitoring Suppl (ACCU-CHEK AVIVA) device    Blood Pressure Monitoring (BLOOD PRESSURE CUFF) MISC    budesonide-formoterol (SYMBICORT) 160-4.5 MCG/ACT inhaler    cefdinir (OMNICEF) 300 MG capsule    Cholecalciferol (VITAMIN D PO)    clonazePAM (KLONOPIN) 0.5 MG tablet    furosemide (LASIX) 40 MG tablet    glimepiride (AMARYL) 4 MG tablet    glucose blood (ACCU-CHEK AVIVA) test strip    hydrALAZINE (APRESOLINE) 10 MG tablet    hydroxychloroquine (PLAQUENIL) 200 MG tablet    isosorbide dinitrate (ISORDIL) 30 MG tablet    Lancets (ACCU-CHEK SOFT TOUCH)  lancets    lisinopril (ZESTRIL) 10 MG tablet    Misc. Devices (PULSE OXIMETER) MISC    mometasone-formoterol (DULERA) 100-5 MCG/ACT AERO    ONETOUCH DELICA LANCETS 81X MISC    OXYGEN    Potassium Chloride ER 20 MEQ TBCR    predniSONE (DELTASONE) 10 MG tablet    PRODIGY NO CODING BLOOD GLUC test strip    sertraline (ZOLOFT) 50 MG tablet    zolpidem (AMBIEN CR) 12.5 MG CR tablet     Goals        . Client has stress related to ongoing health issues and wishes to talk more about stress over current health issues (pt-stated)     Current Barriers:  [name] Mental Health Concerns of client with chronic diagnoses of HTN, GERD, DM type 2, OSA .  Challenges related to managing health issues of client   Clinical Social Work Clinical Goal(s):  [name] Over the next 30 days, client will work with LCSW to address concerns related to health issues of client and client management of health issues faced  Interventions:  Encouraged client to communicate with RNCM to discuss nursing needs of client  Talked with client about his management of health issues faced by client  Talked with client about ambulation challenges of client   Talked with client about pain issues of client   Talked with client about breathing issues of client  Talked with client about client's upcoming client appointments  Patient Self Care Activities:  .  Self administers medications as prescribed . Attends all scheduled provider appointments . Performs ADL's independently   Plan:  LCSW to call client in next 4 weeks to talk with client about stress issues related to his management of health issues faced Client to communicate with RNCM to discuss nursing needs of client Client to attend scheduled client medical appointments   Initial goal documentation          Follow Up Plan: LCSW to call client in next 4 weeks to talk with client about stress issues related to management of his health conditions  [name].[name] MSW,  LCSW Licensed Clinical Social Worker [facility] [phone]

## 2019-01-16 ENCOUNTER — Other Ambulatory Visit: Payer: Self-pay

## 2019-01-16 ENCOUNTER — Encounter: Payer: Self-pay | Admitting: Family Medicine

## 2019-01-16 ENCOUNTER — Ambulatory Visit (INDEPENDENT_AMBULATORY_CARE_PROVIDER_SITE_OTHER): Payer: PPO | Admitting: Family Medicine

## 2019-01-16 DIAGNOSIS — I10 Essential (primary) hypertension: Secondary | ICD-10-CM | POA: Diagnosis not present

## 2019-01-16 DIAGNOSIS — Z20822 Contact with and (suspected) exposure to covid-19: Secondary | ICD-10-CM

## 2019-01-16 MED ORDER — ISOSORBIDE DINITRATE 30 MG PO TABS: 30.0000 mg | ORAL_TABLET | Freq: Three times a day (TID) | ORAL | 3 refills | Status: DC

## 2019-01-16 NOTE — Progress Notes (Signed)
Virtual Visit via telephone Note  I connected with Kenneth Fuller on 01/16/19 at 1009 by telephone and verified that I am speaking with the correct person using two identifiers. Kenneth Fuller is currently located at home and no other people are currently with her during visit. The provider, [name] Olis Viverette, MD is located in their office at time of visit.  Call ended at 1025  I discussed the limitations, risks, security and privacy concerns of performing an evaluation and management service by telephone and the availability of in person appointments. I also discussed with the patient that there may be a patient responsible charge related to this service. The patient expressed understanding and agreed to proceed.   History and Present Illness: Patient is calling in for er f/u and was shortness of breath and he is back to where normally is.  He feels like his breathing is doing good and he is getting over the Covid finally, he tested positive for over a month from October into November.  Hypertension Patient is currently on hydralazine an disosorbide dinatrate and hydralazine and bisoprolol, and their blood pressure today is 180/99 and has been higher than that. Patient denies any lightheadedness or dizziness. Patient denies chest pains, shortness of breath, or weakness. Denies any side effects from medication and is content with current medication. Patient is seeing spots and blurred vision. His vision is worsening. 173/114, 179/104, 10/104, 191/111.  He denies any chest pain but does have some mild headaches at times and says that his shortness of breath is back in his baseline.   No diagnosis found.  Outpatient Encounter Medications as of 01/16/2019  Medication Sig  . albuterol (PROVENTIL HFA;VENTOLIN HFA) 108 (90 Base) MCG/ACT inhaler Inhale 2 puffs into the lungs every 6 (six) hours as needed for wheezing or shortness of breath.  [name] atorvastatin (LIPITOR) 40 MG tablet Take 1  tablet by mouth once daily  . azithromycin (ZITHROMAX Z-PAK) 250 MG tablet Take two right away Then one a day for the next 4 days.  . bisoprolol (ZEBETA) 5 MG tablet Take 0.5 tablets (2.5 mg total) by mouth daily.  . Blood Glucose Monitoring Suppl (ACCU-CHEK AVIVA) device Use as instructed  . Blood Pressure Monitoring (BLOOD PRESSURE CUFF) MISC 1 each by Does not apply route daily.  . budesonide-formoterol (SYMBICORT) 160-4.5 MCG/ACT inhaler Inhale 2 puffs into the lungs 2 (two) times daily.  . cefdinir (OMNICEF) 300 MG capsule Take 1 capsule (300 mg total) by mouth 2 (two) times daily. 1 po BID  . Cholecalciferol (VITAMIN D PO) Take 5,000 Units by mouth every morning.   . clonazePAM (KLONOPIN) 0.5 MG tablet Take 1 tablet (0.5 mg total) by mouth at bedtime as needed.  . furosemide (LASIX) 40 MG tablet For a weight of 270 or higher, take 80 mg twice daily. For a weight of 269 and lower, take 80 mg in the morning and 40 mg in the evening.  [name] glimepiride (AMARYL) 4 MG tablet TAKE 1 TABLET BY MOUTH WITH BREAKFAST  . glucose blood (ACCU-CHEK AVIVA) test strip 1 each by Other route daily. Use as instructed  . hydrALAZINE (APRESOLINE) 10 MG tablet Take 1 tablet (10 mg total) by mouth 3 (three) times daily.  . hydroxychloroquine (PLAQUENIL) 200 MG tablet Take 2 tablets (400 mg total) by mouth daily.  . isosorbide dinitrate (ISORDIL) 30 MG tablet Take 0.5 tablets (15 mg total) by mouth 3 (three) times daily.  . Lancets (ACCU-CHEK SOFT TOUCH) lancets 1  each by Other route daily. Use as instructed  . lisinopril (ZESTRIL) 10 MG tablet Take 1 tablet (10 mg total) by mouth daily.  . Misc. Devices (PULSE OXIMETER) MISC 1 each by Does not apply route continuous as needed.  . mometasone-formoterol (DULERA) 100-5 MCG/ACT AERO Inhale 2 puffs into the lungs 2 (two) times daily.  [name] DELICA LANCETS 90Z MISC 1 each by Does not apply route daily. Test 1X per day and prn  . OXYGEN Inhale 2 L into the lungs at  bedtime. 2lpm with sleep   . Potassium Chloride ER 20 MEQ TBCR Take 1 tablet by mouth daily.  . predniSONE (DELTASONE) 10 MG tablet Take 5 daily for 2 days followed by 4,3,2 and 1 for 2 days each.  [name] PRODIGY NO CODING BLOOD GLUC test strip CHECK BLOOD SUGARS ONCE DAILY  . sertraline (ZOLOFT) 50 MG tablet Take 1 tablet (50 mg total) by mouth daily.  [name] zolpidem (AMBIEN CR) 12.5 MG CR tablet Take 1 tablet (12.5 mg total) by mouth at bedtime as needed. for sleep   No facility-administered encounter medications on file as of 01/16/2019.     Review of Systems  Constitutional: Negative for chills and fever.  Eyes: Positive for visual disturbance.  Respiratory: Negative for cough, shortness of breath and wheezing.   Cardiovascular: Negative for chest pain and leg swelling.  Musculoskeletal: Negative for back pain and gait problem.  Skin: Negative for rash.  Neurological: Positive for headaches. Negative for dizziness, weakness and light-headedness.  All other systems reviewed and are negative.   Observations/Objective: Patient sounds comfortable and in no acute distress  Assessment and Plan: Problem List Items Addressed This Visit      Cardiovascular and Mediastinum   Essential hypertension - Primary   Relevant Medications   isosorbide dinitrate (ISORDIL) 30 MG tablet      He has tried to get in the eye doctor but they will not see him until he has a negative Covid test so we recommended that he go get tested again today to see where its at. Follow Up Instructions: Instructed patient to increase his isosorbide dinitrate to take 30 mg 3 times a day and keep a close eye on the blood pressure, if he starts feeling lightheaded and dizzy again to let us know.    I discussed the assessment and treatment plan with the patient. The patient was provided an opportunity to ask questions and all were answered. The patient agreed with the plan and demonstrated an understanding of the instructions.    The patient was advised to call back or seek an in-person evaluation if the symptoms worsen or if the condition fails to improve as anticipated.  The above assessment and management plan was discussed with the patient. The patient verbalized understanding of and has agreed to the management plan. Patient is aware to call the clinic if symptoms persist or worsen. Patient is aware when to return to the clinic for a follow-up visit. Patient educated on when it is appropriate to go to the emergency department.    I provided 16 minutes of non-face-to-face time during this encounter.    Worthy Rancher, MD

## 2019-01-17 DIAGNOSIS — R0600 Dyspnea, unspecified: Secondary | ICD-10-CM | POA: Diagnosis not present

## 2019-01-17 DIAGNOSIS — R0902 Hypoxemia: Secondary | ICD-10-CM | POA: Diagnosis not present

## 2019-01-18 LAB — NOVEL CORONAVIRUS, NAA: SARS-CoV-2, NAA: NOT DETECTED

## 2019-01-19 ENCOUNTER — Emergency Department (HOSPITAL_COMMUNITY): Payer: PPO

## 2019-01-19 ENCOUNTER — Telehealth: Payer: Self-pay | Admitting: *Deleted

## 2019-01-19 ENCOUNTER — Other Ambulatory Visit: Payer: Self-pay

## 2019-01-19 ENCOUNTER — Emergency Department (HOSPITAL_COMMUNITY)
Admission: EM | Admit: 2019-01-19 | Discharge: 2019-01-19 | Disposition: A | Discharge status: Home / Self Care | Payer: PPO | Attending: Emergency Medicine | Admitting: Emergency Medicine

## 2019-01-19 ENCOUNTER — Encounter (HOSPITAL_COMMUNITY): Payer: Self-pay

## 2019-01-19 DIAGNOSIS — N183 Chronic kidney disease, stage 3 unspecified: Secondary | ICD-10-CM | POA: Insufficient documentation

## 2019-01-19 DIAGNOSIS — Z79899 Other long term (current) drug therapy: Secondary | ICD-10-CM | POA: Diagnosis not present

## 2019-01-19 DIAGNOSIS — E1122 Type 2 diabetes mellitus with diabetic chronic kidney disease: Secondary | ICD-10-CM | POA: Insufficient documentation

## 2019-01-19 DIAGNOSIS — I509 Heart failure, unspecified: Secondary | ICD-10-CM | POA: Diagnosis not present

## 2019-01-19 DIAGNOSIS — Z87891 Personal history of nicotine dependence: Secondary | ICD-10-CM | POA: Diagnosis not present

## 2019-01-19 DIAGNOSIS — R42 Dizziness and giddiness: Secondary | ICD-10-CM | POA: Diagnosis not present

## 2019-01-19 DIAGNOSIS — F0781 Postconcussional syndrome: Secondary | ICD-10-CM | POA: Diagnosis not present

## 2019-01-19 DIAGNOSIS — I130 Hypertensive heart and chronic kidney disease with heart failure and stage 1 through stage 4 chronic kidney disease, or unspecified chronic kidney disease: Secondary | ICD-10-CM | POA: Diagnosis not present

## 2019-01-19 DIAGNOSIS — S199XXA Unspecified injury of neck, initial encounter: Secondary | ICD-10-CM | POA: Diagnosis not present

## 2019-01-19 DIAGNOSIS — R519 Headache, unspecified: Secondary | ICD-10-CM | POA: Diagnosis not present

## 2019-01-19 DIAGNOSIS — S0990XA Unspecified injury of head, initial encounter: Secondary | ICD-10-CM | POA: Diagnosis not present

## 2019-01-19 DIAGNOSIS — I2510 Atherosclerotic heart disease of native coronary artery without angina pectoris: Secondary | ICD-10-CM | POA: Insufficient documentation

## 2019-01-19 LAB — CBC
HCT: 37 % — ABNORMAL LOW (ref 39.0–52.0)
Hemoglobin: 11.4 g/dL — ABNORMAL LOW (ref 13.0–17.0)
MCH: 25.9 pg — ABNORMAL LOW (ref 26.0–34.0)
MCHC: 30.8 g/dL (ref 30.0–36.0)
MCV: 83.9 fL (ref 80.0–100.0)
Platelets: 215 10*3/uL (ref 150–400)
RBC: 4.41 MIL/uL (ref 4.22–5.81)
RDW: 17.1 % — ABNORMAL HIGH (ref 11.5–15.5)
WBC: 7 10*3/uL (ref 4.0–10.5)
nRBC: 0 % (ref 0.0–0.2)

## 2019-01-19 LAB — BASIC METABOLIC PANEL
Anion gap: 7 (ref 5–15)
BUN: 21 mg/dL (ref 8–23)
CO2: 26 mmol/L (ref 22–32)
Calcium: 8.6 mg/dL — ABNORMAL LOW (ref 8.9–10.3)
Chloride: 104 mmol/L (ref 98–111)
Creatinine, Ser: 1.11 mg/dL (ref 0.61–1.24)
GFR calc Af Amer: >60 mL/min (ref >60)
GFR calc non Af Amer: >60 mL/min (ref >60)
Glucose, Bld: 179 mg/dL — ABNORMAL HIGH (ref 70–99)
Potassium: 4.1 mmol/L (ref 3.5–5.1)
Sodium: 137 mmol/L (ref 135–145)

## 2019-01-19 MED ORDER — ONDANSETRON HCL 4 MG/2ML IJ SOLN
4.0000 mg | Freq: Once | INTRAMUSCULAR | Status: AC
  Administered 2019-01-19: 4 mg via INTRAVENOUS
  Filled 2019-01-19: qty 2

## 2019-01-19 MED ORDER — METOCLOPRAMIDE HCL 5 MG/ML IJ SOLN
5.0000 mg | Freq: Once | INTRAMUSCULAR | Status: AC
  Administered 2019-01-19: 02:00:00 5 mg via INTRAVENOUS
  Filled 2019-01-19: qty 2

## 2019-01-19 MED ORDER — TRAMADOL HCL 50 MG PO TABS: 50.0000 mg | ORAL_TABLET | Freq: Four times a day (QID) | ORAL | 0 refills | Status: DC | PRN

## 2019-01-19 MED ORDER — HYDROMORPHONE HCL 1 MG/ML IJ SOLN
1.0000 mg | Freq: Once | INTRAMUSCULAR | Status: AC
  Administered 2019-01-19: 04:00:00 1 mg via INTRAVENOUS
  Filled 2019-01-19: qty 1

## 2019-01-19 MED ORDER — DIPHENHYDRAMINE HCL 50 MG/ML IJ SOLN
25.0000 mg | Freq: Once | INTRAMUSCULAR | Status: AC
  Administered 2019-01-19: 02:00:00 25 mg via INTRAVENOUS
  Filled 2019-01-19: qty 1

## 2019-01-19 MED ORDER — KETOROLAC TROMETHAMINE 30 MG/ML IJ SOLN
15.0000 mg | Freq: Once | INTRAMUSCULAR | Status: AC
  Administered 2019-01-19: 02:00:00 15 mg via INTRAVENOUS
  Filled 2019-01-19: qty 1

## 2019-01-19 MED ORDER — HYDRALAZINE HCL 20 MG/ML IJ SOLN
20.0000 mg | Freq: Once | INTRAMUSCULAR | Status: AC
  Administered 2019-01-19: 20 mg via INTRAVENOUS
  Filled 2019-01-19: qty 1

## 2019-01-19 NOTE — ED Triage Notes (Signed)
Pt fell backward hitting the back of his head on the floor 2 weeks ago, has been unable to get into his doctor for an appointment.   Pt states his head has been hurting and has had some dizziness as well.

## 2019-01-19 NOTE — Telephone Encounter (Signed)
From [place] w/ [facility] FYI Pt has now tested negative for COVID Pt is still having HA's & floaters w/ BP of 162/72 despite increase in BP meds. [facility] sent him to ED to have CT done. Pt also having SOB since having been diagnosed previuosly with COVID.

## 2019-01-19 NOTE — ED Notes (Signed)
Pt blood pressures noted to be increasingly elevated as well of pt c/o throbbing headache and blurry vision. Pt states that bp has been high since around the time of the fall (at home) 2 weeks ago. EDP made aware.

## 2019-01-19 NOTE — ED Provider Notes (Addendum)
[facility] Provider Note   CSN: [id number] Arrival date & time: 01/19/19  0021     History Chief Complaint  Patient presents with  . Headache  . Dizziness    Kenneth Fuller is a 73 y.o. male.  Patient presents to the emergency department for evaluation of headache.  Patient reports that he fell approximately 2-1/2 weeks ago.  He fell backwards and hit the back of his head.  He did not have any loss of consciousness but has had persistent headaches ever since.  He did not come in to be seen after the fall.  Patient reports that the headache has been mostly posterior and he has had some visual disturbance with it.  Tonight the headache has gotten worse.        Past Medical History:  Diagnosis Date  . Allergy   . Asthma   . CAD (coronary artery disease)   . CHF (congestive heart failure) ([place])   . Diabetes mellitus   . Fibromyalgia   . GERD (gastroesophageal reflux disease)   . GI bleeding   . Gout   . Hyperlipidemia   . Hypertension   . Hypogonadism male   . Insomnia   . MRSA cellulitis   . Neuropathy   . Obesity   . Shortness of breath dyspnea    with exertion   . Sleep apnea    cpap- 14   . Wheezing    no asthma diagnosis    Patient Active Problem List   Diagnosis Date Noted  . Chest pain 09/23/2018  . Acute on chronic diastolic CHF (congestive heart failure) ([place]) 09/23/2018  . Insomnia   . CHF (congestive heart failure) ([place]) 01/06/2018  . Depression with anxiety 01/04/2018  . CKD (chronic kidney disease), stage III 01/04/2018  . Chronic obstructive pulmonary disease ([place]) 01/04/2018  . Depression, recurrent ([place]) 11/11/2017  . Chronic diastolic heart failure ([place]) 04/13/2017  . Swelling of lower extremity 11/30/2016  . Obesity, Class III, BMI 40-49.9 (morbid obesity) ([place]) 08/21/2015  . Coronary artery disease involving native coronary artery of native heart without angina pectoris 12/05/2014  . Urinary urgency 10/15/2014    . Spinal stenosis, lumbar region, with neurogenic claudication 01/02/2014  . Hyperlipidemia associated with type 2 diabetes mellitus ([place]) 10/25/2013  . COLONIC POLYPS, ADENOMATOUS 02/25/2007  . Diabetes mellitus type II, non insulin dependent ([place]) 02/25/2007  . Gout 02/25/2007  . Essential hypertension 02/25/2007  . Cough variant asthma 02/25/2007  . OSA on CPAP 02/25/2007  . FATIGUE, CHRONIC 02/25/2007  . HEADACHE, CHRONIC 02/25/2007  . GERD (gastroesophageal reflux disease) 02/25/2007    Past Surgical History:  Procedure Laterality Date  . BACK SURGERY    . CARDIAC CATHETERIZATION    . COLONOSCOPY    . LEFT HEART CATH AND CORONARY ANGIOGRAPHY N/A 12/02/2016   Procedure: LEFT HEART CATH AND CORONARY ANGIOGRAPHY;  Surgeon: Jettie Booze, MD;  Location: [facility];  Service: Cardiovascular;  Laterality: N/A;  . LUMBAR LAMINECTOMY/DECOMPRESSION MICRODISCECTOMY N/A 01/02/2014   Procedure: CENTRAL DECOMPRESSION LUMBAR LAMINECTOMY L3-L4, L4-L5;  Surgeon: Tobi Bastos, MD;  Location: [facility];  Service: Orthopedics;  Laterality: N/A;  . neck fusion         Family History  Problem Relation Age of Onset  . Colon cancer Mother   . Diabetes Father        siblings  . Heart disease Father        brother  . Heart attack Father   .  Kidney disease Sister   . Heart failure Sister   . Heart disease Brother   . Heart attack Son 47  . Drug abuse Sister   . Heart disease Brother   . Deep vein thrombosis Brother   . Colon polyps Neg Hx     Social History   Tobacco Use  . Smoking status: Former Smoker    Quit date: 02/08/1969    Years since quitting: 49.9  . Smokeless tobacco: Former Systems developer    Quit date: 1971  Substance Use Topics  . Alcohol use: No    Alcohol/week: 0.0 standard drinks  . Drug use: No    Home Medications Prior to Admission medications   Medication Sig Start Date End Date Taking? Authorizing Provider  albuterol (PROVENTIL HFA;VENTOLIN HFA) 108  (90 Base) MCG/ACT inhaler Inhale 2 puffs into the lungs every 6 (six) hours as needed for wheezing or shortness of breath. 07/20/17   Brand Males, MD  atorvastatin (LIPITOR) 40 MG tablet Take 1 tablet by mouth once daily 11/07/18   [name], [name], MD  azithromycin (ZITHROMAX Z-PAK) 250 MG tablet Take two right away Then one a day for the next 4 days. 12/28/18   Claretta Fraise, MD  bisoprolol (ZEBETA) 5 MG tablet Take 0.5 tablets (2.5 mg total) by mouth daily. 10/05/18   [name], [name], MD  Blood Glucose Monitoring Suppl (ACCU-CHEK AVIVA) device Use as instructed 04/18/18 04/18/19  [name], [name], MD  Blood Pressure Monitoring (BLOOD PRESSURE CUFF) MISC 1 each by Does not apply route daily. 05/22/18   [name], [name], MD  budesonide-formoterol (SYMBICORT) 160-4.5 MCG/ACT inhaler Inhale 2 puffs into the lungs 2 (two) times daily. 01/09/18   Barton Dubois, MD  Cholecalciferol (VITAMIN D PO) Take 5,000 Units by mouth every morning.     [provider]  clonazePAM (KLONOPIN) 0.5 MG tablet Take 1 tablet (0.5 mg total) by mouth at bedtime as needed. 10/05/18   [name], [name], MD  furosemide (LASIX) 40 MG tablet For a weight of 270 or higher, take 80 mg twice daily. For a weight of 269 and lower, take 80 mg in the morning and 40 mg in the evening. 10/17/18   Croitoru, Mihai, MD  glimepiride (AMARYL) 4 MG tablet TAKE 1 TABLET BY MOUTH WITH BREAKFAST 01/18/18   [name], [name], MD  glucose blood (ACCU-CHEK AVIVA) test strip 1 each by Other route daily. Use as instructed 04/18/18   [name], [name], MD  hydrALAZINE (APRESOLINE) 10 MG tablet Take 1 tablet (10 mg total) by mouth 3 (three) times daily. 10/05/18 10/05/19  [name], [name], MD  hydroxychloroquine (PLAQUENIL) 200 MG tablet Take 2 tablets (400 mg total) by mouth daily. 12/28/18   Claretta Fraise, MD  isosorbide dinitrate (ISORDIL) 30 MG tablet Take 1 tablet (30 mg total) by mouth 3 (three) times daily. 01/16/19    [name], [name], MD  Lancets (ACCU-CHEK SOFT TOUCH) lancets 1 each by Other route daily. Use as instructed 04/18/18   [name], [name], MD  lisinopril (ZESTRIL) 10 MG tablet Take 1 tablet (10 mg total) by mouth daily. 11/15/18   [name], [name], MD  Misc. Devices (PULSE OXIMETER) MISC 1 each by Does not apply route continuous as needed. 07/26/18   [name], [name], MD  mometasone-formoterol (DULERA) 100-5 MCG/ACT AERO Inhale 2 puffs into the lungs 2 (two) times daily.    [provider]  [facility] DELICA LANCETS 79G MISC 1 each by Does not apply route  daily. Test 1X per day and prn 03/30/17   [name], [name], MD  OXYGEN Inhale 2 L into the lungs at bedtime. 2lpm with sleep     [provider]  Potassium Chloride ER 20 MEQ TBCR Take 1 tablet by mouth daily. 08/08/18   Almyra Deforest, PA  predniSONE (DELTASONE) 10 MG tablet Take 5 daily for 2 days followed by 4,3,2 and 1 for 2 days each. 12/28/18   Claretta Fraise, MD  PRODIGY NO CODING BLOOD GLUC test strip CHECK BLOOD SUGARS ONCE DAILY 01/17/18   [name], [name], MD  sertraline (ZOLOFT) 50 MG tablet Take 1 tablet (50 mg total) by mouth daily. 10/05/18   [name], [name], MD  traMADol (ULTRAM) 50 MG tablet Take 1 tablet (50 mg total) by mouth every 6 (six) hours as needed. 01/19/19   Orpah Greek, MD  zolpidem (AMBIEN CR) 12.5 MG CR tablet Take 1 tablet (12.5 mg total) by mouth at bedtime as needed. for sleep 10/19/18   [name], [name], MD    Allergies    Amlodipine besy-benazepril hcl, Oxycodone, Phenergan [promethazine hcl], and Hydrocodone  Review of Systems   Review of Systems  Eyes: Positive for visual disturbance.  Neurological: Positive for dizziness and headaches.  All other systems reviewed and are negative.   Physical Exam Updated Vital Signs BP (!) 150/67   Pulse 65   Temp 98.6 F (37 C) (Oral)   Resp 13   Ht _0  (1.702 m)   Wt 127 kg   SpO2 96%   BMI 43.85 kg/m   Physical  Exam Vitals and nursing note reviewed.  Constitutional:      General: He is not in acute distress.    Appearance: Normal appearance. He is well-developed.  HENT:     Head: Normocephalic. Contusion present.      Right Ear: Hearing normal.     Left Ear: Hearing normal.     Nose: Nose normal.  Eyes:     Conjunctiva/sclera: Conjunctivae normal.     Pupils: Pupils are equal, round, and reactive to light.  Neck:   Cardiovascular:     Rate and Rhythm: Regular rhythm.     Heart sounds: S1 normal and S2 normal. No murmur. No friction rub. No gallop.   Pulmonary:     Effort: Pulmonary effort is normal. No respiratory distress.     Breath sounds: Normal breath sounds.  Chest:     Chest wall: No tenderness.  Abdominal:     General: Bowel sounds are normal.     Palpations: Abdomen is soft.     Tenderness: There is no abdominal tenderness. There is no guarding or rebound. Negative signs include Murphy's sign and McBurney's sign.     Hernia: No hernia is present.  Musculoskeletal:        General: Normal range of motion.     Cervical back: Normal range of motion and neck supple.  Skin:    General: Skin is warm and dry.     Findings: No rash.  Neurological:     Mental Status: He is alert and oriented to person, place, and time.     GCS: GCS eye subscore is 4. GCS verbal subscore is 5. GCS motor subscore is 6.     Cranial Nerves: No cranial nerve deficit.     Sensory: No sensory deficit.     Coordination: Coordination normal.     Comments: Extraocular muscle movement: normal No visual field cut Pupils:  equal and reactive both direct and consensual response is normal No nystagmus present    Sensory function is intact to light touch, pinprick Proprioception intact  Grip strength 5/5 symmetric in upper extremities No pronator drift Normal finger to nose bilaterally  Lower extremity strength 5/5 against gravity Normal heel to shin bilaterally  Gait: normal   Psychiatric:         Speech: Speech normal.        Behavior: Behavior normal.        Thought Content: Thought content normal.     ED Results / Procedures / Treatments   Labs (all labs ordered are listed, but only abnormal results are displayed) Labs Reviewed  CBC - Abnormal; Notable for the following components:      Result Value   Hemoglobin 11.4 (*)    HCT 37.0 (*)    MCH 25.9 (*)    RDW 17.1 (*)    All other components within normal limits  BASIC METABOLIC PANEL - Abnormal; Notable for the following components:   Glucose, Bld 179 (*)    Calcium 8.6 (*)    All other components within normal limits    EKG None  Radiology CT Head Wo Contrast  Result Date: 01/19/2019 CLINICAL DATA:  Fall striking back of head on floor. Dizziness and head pain. EXAM: CT HEAD WITHOUT CONTRAST CT CERVICAL SPINE WITHOUT CONTRAST TECHNIQUE: Multidetector CT imaging of the head and cervical spine was performed following the standard protocol without intravenous contrast. Multiplanar CT image reconstructions of the cervical spine were also generated. COMPARISON:  Head and cervical spine CT 12/26/2015 FINDINGS: CT HEAD FINDINGS Brain: No intracranial hemorrhage, mass effect, or midline shift. No hydrocephalus. Brain volume is normal for age. Mild chronic small vessel ischemia stable from prior. The basilar cisterns are patent. No evidence of territorial infarct or acute ischemia. No extra-axial or intracranial fluid collection. Vascular: No hyperdense vessel or unexpected calcification. Skull: No fracture or focal lesion. Sinuses/Orbits: Paranasal sinuses and mastoid air cells are clear. The visualized orbits are unremarkable. Other: None. CT CERVICAL SPINE FINDINGS Alignment: Straightening of normal lordosis secondary to anterior fusion hardware from C4 through C7. No traumatic subluxation. Skull base and vertebrae: The linear lucencies through posterior arch of C1 are no longer seen, likely healed fractures. No acute fracture. The  dens is intact. No skull base fracture. Soft tissues and spinal canal: No prevertebral soft tissue edema. No obvious canal hematoma, evaluation slightly limited by streak artifact from fusion hardware. Disc levels: Anterior fusion C4 through C7. similar mild canal stenosis primarily at C6-C7. Disc space narrowing and endplate spurring at M0-N0 and C2-C3, unchanged. Upper chest: Negative. Other: None. IMPRESSION: 1. No acute intracranial abnormality. No skull fracture. 2. No acute fracture or subluxation of the cervical spine. Anterior fusion hardware from C4 through C7. 3. Previous lucencies within the posterior arch of C1 are no longer seen, likely healed fractures. Electronically Signed   By: Keith Rake M.D.   On: 01/19/2019 02:14   CT Cervical Spine Wo Contrast  Result Date: 01/19/2019 CLINICAL DATA:  Fall striking back of head on floor. Dizziness and head pain. EXAM: CT HEAD WITHOUT CONTRAST CT CERVICAL SPINE WITHOUT CONTRAST TECHNIQUE: Multidetector CT imaging of the head and cervical spine was performed following the standard protocol without intravenous contrast. Multiplanar CT image reconstructions of the cervical spine were also generated. COMPARISON:  Head and cervical spine CT 12/26/2015 FINDINGS: CT HEAD FINDINGS Brain: No intracranial hemorrhage, mass effect, or midline shift.  No hydrocephalus. Brain volume is normal for age. Mild chronic small vessel ischemia stable from prior. The basilar cisterns are patent. No evidence of territorial infarct or acute ischemia. No extra-axial or intracranial fluid collection. Vascular: No hyperdense vessel or unexpected calcification. Skull: No fracture or focal lesion. Sinuses/Orbits: Paranasal sinuses and mastoid air cells are clear. The visualized orbits are unremarkable. Other: None. CT CERVICAL SPINE FINDINGS Alignment: Straightening of normal lordosis secondary to anterior fusion hardware from C4 through C7. No traumatic subluxation. Skull base and  vertebrae: The linear lucencies through posterior arch of C1 are no longer seen, likely healed fractures. No acute fracture. The dens is intact. No skull base fracture. Soft tissues and spinal canal: No prevertebral soft tissue edema. No obvious canal hematoma, evaluation slightly limited by streak artifact from fusion hardware. Disc levels: Anterior fusion C4 through C7. similar mild canal stenosis primarily at C6-C7. Disc space narrowing and endplate spurring at Q4-O9 and C2-C3, unchanged. Upper chest: Negative. Other: None. IMPRESSION: 1. No acute intracranial abnormality. No skull fracture. 2. No acute fracture or subluxation of the cervical spine. Anterior fusion hardware from C4 through C7. 3. Previous lucencies within the posterior arch of C1 are no longer seen, likely healed fractures. Electronically Signed   By: Keith Rake M.D.   On: 01/19/2019 02:14    Procedures Procedures (including critical care time)  Medications Ordered in ED Medications  ketorolac (TORADOL) 30 MG/ML injection 15 mg (15 mg Intravenous Given 01/19/19 0226)  metoCLOPramide (REGLAN) injection 5 mg (5 mg Intravenous Given 01/19/19 0227)  diphenhydrAMINE (BENADRYL) injection 25 mg (25 mg Intravenous Given 01/19/19 0228)    ED Course  I have reviewed the triage vital signs and the nursing notes.  Pertinent labs & imaging results that were available during my care of the patient were reviewed by me and considered in my medical decision making (see chart for details).    MDM Rules/Calculators/A&P     CHA2DS2/VAS Stroke Risk Points      N/A >= 2 Points: High Risk  1 - 1.99 Points: Medium Risk  0 Points: Low Risk    A final score could not be computed because of missing components.: Last  Change: N/A     This score determines the patient's risk of having a stroke if the  patient has atrial fibrillation.      This score is not applicable to this patient. Components are not  calculated.                    Patient presents to the emergency department for evaluation of headache.  Patient fell over 2 weeks ago.  He did not seek care after the fall.  Since then, however, he has been experiencing daily headaches and some visual disturbance as well as dizziness.  Patient has a completely normal neurologic exam.  He was sent for CT head to further evaluate.  He has a history of cervical fracture and did have some tenderness at the base of the skull, top of cervical spine.  C-spine was also imaged and neither image showed any acute pathology.  Patient likely experiencing some persistent headaches after the initial head injury, treated as possible migraine here in the ER initially. Only had some improvement. BP elevated given hydralazine. Dilaudid for headache with improvement. Will continue analgesia and follow-up with neurology.  Final Clinical Impression(s) / ED Diagnoses Final diagnoses:  Post concussive syndrome    Rx / DC Orders ED Discharge Orders  Ordered    traMADol (ULTRAM) 50 MG tablet  Every 6 hours PRN     01/19/19 0250           Orpah Greek, MD 01/19/19 8502    Orpah Greek, MD 01/19/19 0425

## 2019-01-22 NOTE — Telephone Encounter (Signed)
I agree with patient going emergency department

## 2019-01-24 ENCOUNTER — Ambulatory Visit (INDEPENDENT_AMBULATORY_CARE_PROVIDER_SITE_OTHER): Payer: PPO | Admitting: Family Medicine

## 2019-01-24 ENCOUNTER — Telehealth: Payer: Self-pay | Admitting: Family Medicine

## 2019-01-24 ENCOUNTER — Encounter: Payer: Self-pay | Admitting: Family Medicine

## 2019-01-24 ENCOUNTER — Other Ambulatory Visit: Payer: Self-pay

## 2019-01-24 DIAGNOSIS — S060X0A Concussion without loss of consciousness, initial encounter: Secondary | ICD-10-CM

## 2019-01-24 MED ORDER — PREDNISONE 10 MG PO TABS: ORAL_TABLET | ORAL | 0 refills | Status: DC

## 2019-01-24 MED ORDER — TRAMADOL HCL 50 MG PO TABS: 50.0000 mg | ORAL_TABLET | Freq: Four times a day (QID) | ORAL | 0 refills | Status: DC | PRN

## 2019-01-24 NOTE — Progress Notes (Signed)
Subjective:    Patient ID: Kenneth Fuller, male    DOB: May 26, 1945, 73 y.o.   MRN: [id number]   HPI: Kenneth Fuller is a 73 y.o. male presenting for headache for three weeks. Rough. Had a fall. Struck the back of his head on the floor at onset. Pain radiates from front of head to the back. No history of HA. BP 177/102 an hour ago. Tramadol takes the edge off, but not strong enough. HA Sharpness like an ice pick sticking in it. Made worse by activities such as being around a lot of people conversing.    Depression screen [facility] 2/9 11/15/2018 10/05/2018 08/11/2018 07/17/2018 04/24/2018  Decreased Interest 0 0 0 0 1  Down, Depressed, Hopeless 0 1 - 0 0  PHQ - 2 Score 0 1 0 0 1  Altered sleeping - - - - -  Tired, decreased energy - - - - -  Change in appetite - - - - -  Feeling bad or failure about yourself  - - - - -  Trouble concentrating - - - - -  Moving slowly or fidgety/restless - - - - -  Suicidal thoughts - - - - -  PHQ-9 Score - - - - -  Difficult doing work/chores - - - - -  Some recent data might be hidden     Relevant past medical, surgical, family and social history reviewed and updated as indicated.  Interim medical history since our last visit reviewed. Allergies and medications reviewed and updated.  ROS:  Review of Systems  Constitutional: Negative.   HENT: Negative.   Eyes: Negative for visual disturbance.  Respiratory: Positive for shortness of breath (baseline).   Cardiovascular: Negative for chest pain and leg swelling.  Gastrointestinal: Negative for abdominal pain, nausea and vomiting.  Genitourinary: Negative for difficulty urinating.  Musculoskeletal: Negative for arthralgias and myalgias.  Skin: Negative for rash.  Neurological: Positive for headaches.  Psychiatric/Behavioral: Positive for sleep disturbance.     Social History   Tobacco Use  Smoking Status Former Smoker  . Quit date: 02/08/1969  . Years since quitting: 49.9  Smokeless Tobacco  Former Systems developer  . Quit date: 1971       Objective:     Wt Readings from Last 3 Encounters:  01/19/19 280 lb (127 kg)  12/28/18 275 lb (124.7 kg)  11/15/18 277 lb 3.2 oz (125.7 kg)     Exam deferred. Pt. Harboring due to COVID 19. Phone visit performed.   Assessment & Plan:   1. Concussion without loss of consciousness, initial encounter     Meds ordered this encounter  Medications  . traMADol (ULTRAM) 50 MG tablet    Sig: Take 1 tablet (50 mg total) by mouth every 6 (six) hours as needed.    Dispense:  15 tablet    Refill:  0  . predniSONE (DELTASONE) 10 MG tablet    Sig: Take 5 daily for 2 days followed by 4,3,2 and 1 for 2 days each.    Dispense:  30 tablet    Refill:  0    Orders Placed This Encounter  Procedures  . Neurology referral    Referral Priority:   Routine    Referral Type:   Consultation    Referral Reason:   Specialty Services Required    Requested Specialty:   Neurology    Number of Visits Requested:   1    Pt. Advised to rest, avoid excess stimuli.  Diagnoses  and all orders for this visit:  Concussion without loss of consciousness, initial encounter -     Neurology referral  Other orders -     traMADol (ULTRAM) 50 MG tablet; Take 1 tablet (50 mg total) by mouth every 6 (six) hours as needed. -     predniSONE (DELTASONE) 10 MG tablet; Take 5 daily for 2 days followed by 4,3,2 and 1 for 2 days each.    Virtual Visit via telephone Note  I discussed the limitations, risks, security and privacy concerns of performing an evaluation and management service by telephone and the availability of in person appointments. The patient was identified with two identifiers. Pt.expressed understanding and agreed to proceed. Pt. Is at home. Dr. Livia Snellen is in his office.  Follow Up Instructions:   I discussed the assessment and treatment plan with the patient. The patient was provided an opportunity to ask questions and all were answered. The patient agreed with  the plan and demonstrated an understanding of the instructions.   The patient was advised to call back or seek an in-person evaluation if the symptoms worsen or if the condition fails to improve as anticipated.   Total minutes including chart review and phone contact time: 30   Follow up plan: Return if symptoms worsen or fail to improve.  Claretta Fraise, MD [place]

## 2019-01-24 NOTE — Telephone Encounter (Signed)
Please schedule for a visit with someone as soon as possible, can be the acute provider, can be a televisit or in person depending on what he is able to do but I think would this needs to be discussed at more.  Continue to check blood pressure and get more numbers.

## 2019-01-29 ENCOUNTER — Ambulatory Visit: Payer: Self-pay | Admitting: Licensed Clinical Social Worker

## 2019-01-29 DIAGNOSIS — J441 Chronic obstructive pulmonary disease with (acute) exacerbation: Secondary | ICD-10-CM

## 2019-01-29 DIAGNOSIS — I2510 Atherosclerotic heart disease of native coronary artery without angina pectoris: Secondary | ICD-10-CM

## 2019-01-29 DIAGNOSIS — I5042 Chronic combined systolic (congestive) and diastolic (congestive) heart failure: Secondary | ICD-10-CM

## 2019-01-29 DIAGNOSIS — E119 Type 2 diabetes mellitus without complications: Secondary | ICD-10-CM

## 2019-01-29 DIAGNOSIS — F339 Major depressive disorder, recurrent, unspecified: Secondary | ICD-10-CM

## 2019-01-29 NOTE — Chronic Care Management (AMB) (Signed)
Care Management Note   Kenneth Fuller is a 73 y.o. year old male who is a primary care patient of [name], [name], MD. The CM team was consulted for assistance with chronic disease management and care coordination.   I reached out to Kenneth Fuller by phone today.    Review of patient status, including review of consultants reports, relevant laboratory and other test results, and collaboration with appropriate care team members and the patient's provider was performed as part of comprehensive patient evaluation and provision of chronic care management services.   Social determinants of health: risk of tobacco use; risk of physical inactivity    Office Visit from 12/14/2017 in [place]  PHQ-9 Total Score  12      GAD 7 : Generalized Anxiety Score 11/11/2017  Nervous, Anxious, on Edge 3  Control/stop worrying 3  Worry too much - different things 3  Trouble relaxing 3  Restless 3  Easily annoyed or irritable 3  Afraid - awful might happen 0  Total GAD 7 Score 18  Anxiety Difficulty Somewhat difficult   Medications   (very important)  New medications from outside sources are available for reconciliation  albuterol (PROVENTIL HFA;VENTOLIN HFA) 108 (90 Base) MCG/ACT inhaler atorvastatin (LIPITOR) 40 MG tablet azithromycin (ZITHROMAX Z-PAK) 250 MG tablet bisoprolol (ZEBETA) 5 MG tablet Blood Glucose Monitoring Suppl (ACCU-CHEK AVIVA) device Blood Pressure Monitoring (BLOOD PRESSURE CUFF) MISC budesonide-formoterol (SYMBICORT) 160-4.5 MCG/ACT inhaler Cholecalciferol (VITAMIN D PO) clonazePAM (KLONOPIN) 0.5 MG tablet furosemide (LASIX) 40 MG tablet glimepiride (AMARYL) 4 MG tablet glucose blood (ACCU-CHEK AVIVA) test strip hydrALAZINE (APRESOLINE) 10 MG tablet hydroxychloroquine (PLAQUENIL) 200 MG tablet isosorbide dinitrate (ISORDIL) 30 MG tablet Lancets (ACCU-CHEK SOFT TOUCH) lancets lisinopril (ZESTRIL) 10 MG tablet Misc. Devices (PULSE  OXIMETER) MISC mometasone-formoterol (DULERA) 100-5 MCG/ACT AERO ONETOUCH DELICA LANCETS 88L MISC OXYGEN Potassium Chloride ER 20 MEQ TBCR predniSONE (DELTASONE) 10 MG tablet PRODIGY NO CODING BLOOD GLUC test strip sertraline (ZOLOFT) 50 MG tablet traMADol (ULTRAM) 50 MG tablet zolpidem (AMBIEN CR) 12.5 MG CR tablet  Goals Addressed            This Visit's Progress   . Client has stress related to ongoing health issues and wishes to talk more about stress over current health issues (pt-stated)       Current Barriers:  [name] Mental Health Concerns  in patient with Chronic Diagnoses of CAD, Depression,recurrent, DM Type II, CKD, COPD and CHF .  Challenges related to managing health issues of client   Clinical Social Work Clinical Goal(s):  [name] Over the next 30 days, client will work with LCSW to address concerns related to health issues of client and client management of health issues faced  Interventions:  Talked with client about ambulation challenges of client   Talked with client about pain issues of client   Talked with client about breathing issues of client  Talked with client about client's upcoming client appointments  Talked with client about his current headaches (client is taking pain medication as prescribed)  Talked with client about recent eye exam of client  Patient Self Care Activities:  . Self administers medications as prescribed . Attends all scheduled provider appointments . Performs ADL's independently   Plan:  LCSW to call client in next 4 weeks to talk with client about stress issues related to his management of health issues faced Client to communicate with RNCM to discuss nursing needs of client Client to attend scheduled client medical appointments  Initial goal documentation      Follow Up Plan: LCSW to call client in next 4 weeks to talk with client about stress issues related to his management of health issues faced  Kenneth Fuller.Kenneth Fuller  MSW, LCSW Licensed Clinical Social Worker [facility] [phone]

## 2019-01-29 NOTE — Patient Instructions (Addendum)
Licensed Clinical Social Worker Visit Information  Goals we discussed today:  Goals Addressed            This Visit's Progress   . Client has stress related to ongoing health issues and wishes to talk more about stress over current health issues (pt-stated)       Current Barriers:  [name] Mental Health Concerns  in patient with Chronic Diagnoses of CAD, DM Type II, Depression,recurrent, CKD, COPD and CHF .  Challenges related to managing health issues of client   Clinical Social Work Clinical Goal(s):  [name] Over the next 30 days, client will work with LCSW to address concerns related to health issues of client and client management of health issues faced  Interventions:  Talked with client about ambulation challenges of client   Talked with client about pain issues of client   Talked with client about breathing issues of client  Talked with client about client's upcoming client appointments  Talked with client about his current headaches (client is taking pain medication as prescribed)  Talked with client about recent eye exam of client   Patient Self Care Activities:  . Self administers medications as prescribed . Attends all scheduled provider appointments . Performs ADL's independently   Plan:  LCSW to call client in next 4 weeks to talk with client about stress issues related to his management of health issues faced Client to communicate with RNCM to discuss nursing needs of client Client to attend scheduled client medical appointments   Initial goal documentation        Materials Provided: No  Follow Up Plan: LCSW to call client in next 4 weeks to talk with client about stress issues related to management of health issues faced  The patient verbalized understanding of instructions provided today and declined a print copy of patient instruction materials.   [name].Aquita Simmering MSW, LCSW Licensed Clinical Social Worker [facility]  [facility] [phone]

## 2019-01-30 ENCOUNTER — Ambulatory Visit: Payer: PPO | Admitting: *Deleted

## 2019-01-30 DIAGNOSIS — E119 Type 2 diabetes mellitus without complications: Secondary | ICD-10-CM

## 2019-01-30 DIAGNOSIS — I10 Essential (primary) hypertension: Secondary | ICD-10-CM

## 2019-01-30 DIAGNOSIS — J45991 Cough variant asthma: Secondary | ICD-10-CM

## 2019-01-30 NOTE — Chronic Care Management (AMB) (Addendum)
Chronic Care Management   Follow Up Note   01/30/2019 Name: Kenneth Fuller MRN: [id number] DOB: Nov 16, 1945  Referred by: Kenneth Fuller, Kenneth Kaufmann, MD Reason for referral : Chronic Care Management (Incoming Patient Call)   Kenneth Fuller is a 73 y.o. year old male who is a primary care patient of Kenneth Fuller, Kenneth Kaufmann, MD. The CCM team was consulted for assistance with chronic disease management and care coordination needs.    Review of patient status, including review of consultants reports, relevant laboratory and other test results, and collaboration with appropriate care team members and the patient's provider was performed as part of comprehensive patient evaluation and provision of chronic care management services.    I spoke with Kenneth Fuller by telephone today regarding his application for [employer] Patient Assistance.   Outpatient Encounter Medications as of 01/30/2019  Medication Sig  . albuterol (PROVENTIL HFA;VENTOLIN HFA) 108 (90 Base) MCG/ACT inhaler Inhale 2 puffs into the lungs every 6 (six) hours as needed for wheezing or shortness of breath.  [name] atorvastatin (LIPITOR) 40 MG tablet Take 1 tablet by mouth once daily  . azithromycin (ZITHROMAX Z-PAK) 250 MG tablet Take two right away Then one a day for the next 4 days.  . bisoprolol (ZEBETA) 5 MG tablet Take 0.5 tablets (2.5 mg total) by mouth daily.  . Blood Glucose Monitoring Suppl (ACCU-CHEK AVIVA) device Use as instructed  . Blood Pressure Monitoring (BLOOD PRESSURE CUFF) MISC 1 each by Does not apply route daily.  . budesonide-formoterol (SYMBICORT) 160-4.5 MCG/ACT inhaler Inhale 2 puffs into the lungs 2 (two) times daily.  . Cholecalciferol (VITAMIN D PO) Take 5,000 Units by mouth every morning.   . clonazePAM (KLONOPIN) 0.5 MG tablet Take 1 tablet (0.5 mg total) by mouth at bedtime as needed.  . furosemide (LASIX) 40 MG tablet For a weight of 270 or higher, take 80 mg twice daily. For a weight of 269 and lower, take 80  mg in the morning and 40 mg in the evening.  [name] glimepiride (AMARYL) 4 MG tablet TAKE 1 TABLET BY MOUTH WITH BREAKFAST  . glucose blood (ACCU-CHEK AVIVA) test strip 1 each by Other route daily. Use as instructed  . hydrALAZINE (APRESOLINE) 10 MG tablet Take 1 tablet (10 mg total) by mouth 3 (three) times daily.  . hydroxychloroquine (PLAQUENIL) 200 MG tablet Take 2 tablets (400 mg total) by mouth daily.  . isosorbide dinitrate (ISORDIL) 30 MG tablet Take 1 tablet (30 mg total) by mouth 3 (three) times daily.  . Lancets (ACCU-CHEK SOFT TOUCH) lancets 1 each by Other route daily. Use as instructed  . lisinopril (ZESTRIL) 10 MG tablet Take 1 tablet (10 mg total) by mouth daily.  . Misc. Devices (PULSE OXIMETER) MISC 1 each by Does not apply route continuous as needed.  . mometasone-formoterol (DULERA) 100-5 MCG/ACT AERO Inhale 2 puffs into the lungs 2 (two) times daily.  [name] DELICA LANCETS 03J MISC 1 each by Does not apply route daily. Test 1X per day and prn  . OXYGEN Inhale 2 L into the lungs at bedtime. 2lpm with sleep   . Potassium Chloride ER 20 MEQ TBCR Take 1 tablet by mouth daily.  . predniSONE (DELTASONE) 10 MG tablet Take 5 daily for 2 days followed by 4,3,2 and 1 for 2 days each.  [name] PRODIGY NO CODING BLOOD GLUC test strip CHECK BLOOD SUGARS ONCE DAILY  . sertraline (ZOLOFT) 50 MG tablet Take 1 tablet (50 mg total) by mouth daily.  [name]  traMADol (ULTRAM) 50 MG tablet Take 1 tablet (50 mg total) by mouth every 6 (six) hours as needed.  . zolpidem (AMBIEN CR) 12.5 MG CR tablet Take 1 tablet (12.5 mg total) by mouth at bedtime as needed. for sleep   No facility-administered encounter medications on file as of 01/30/2019.     RN Care Plan           This Visit's Progress     . Prescription Assistance (pt-stated)       Prescription assistance needs in a patient with diabetes, asthma, HTN, and OSA.  Current Barriers:  [name] Knowledge Deficits related to prescription assistance .  Film/video editor.   Nurse Case Manager Clinical Goal(s):  [name] Over the next 30 days, patient will meet with RN Care Manager to address prescription assistance form completion for 2021  Interventions:  . Talked with patient by telephone regarding [employer] Application . Patient reports that it was denied because he has insurance coverage . Discussed denial . Advised patient to complete and sign appeal request that was included with the letter . Advised that I feel certain it is a formality and that the appeal should be approved . Encouraged patient to follow-up with me if he has any additional questions or concerns . Advised of Medicare Extra Help o Printed resources to mail patient  Patient Self Care Activities:  . Performs ADL's independently . Performs IADL's independently . Unable to independently n/a  Please see past updates related to this goal by clicking on the "Past Updates" button in the selected goal          The care management team will reach out to the patient again over the next 60 days.    Kenneth Fuller, BSN, RN-BC Embedded Chronic Care Manager [facility] Direct Dial: [phone]

## 2019-01-30 NOTE — Patient Instructions (Signed)
Visit Information  Goals Addressed            This Visit's Progress     Patient Stated   . Prescription Assistance (pt-stated)       Prescription assistance needs in a patient with diabetes, asthma, HTN, and OSA.  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   Nurse Case Manager Clinical Goal(s):  [name] Over the next 30 days, patient will meet with RN Care Manager to address prescription assistance form completion for 2021  Interventions:  . Talked with patient by telephone regarding [employer] Application . Patient reports that it was denied because he has insurance coverage . Discussed denial . Advised patient to complete and sign appeal request that was included with the letter . Advised that I feel certain it is a formality and that the appeal should be approved . Encouraged patient to follow-up with me if he has any additional questions or concerns  Patient Self Care Activities:  . Performs ADL's independently . Performs IADL's independently . Unable to independently n/a  Please see past updates related to this goal by clicking on the "Past Updates" button in the selected goal         The care management team will reach out to the patient again over the next 60 days.   [name], BSN, RN-BC Embedded Chronic Care Manager [facility] Direct Dial: [phone]   The patient verbalized understanding of instructions provided today and declined a print copy of patient instruction materials.

## 2019-02-03 ENCOUNTER — Other Ambulatory Visit: Payer: Self-pay | Admitting: Family Medicine

## 2019-02-05 ENCOUNTER — Other Ambulatory Visit: Payer: Self-pay | Admitting: Family Medicine

## 2019-02-11 DIAGNOSIS — R0600 Dyspnea, unspecified: Secondary | ICD-10-CM | POA: Diagnosis not present

## 2019-02-11 DIAGNOSIS — R0902 Hypoxemia: Secondary | ICD-10-CM | POA: Diagnosis not present

## 2019-02-11 DIAGNOSIS — I5032 Chronic diastolic (congestive) heart failure: Secondary | ICD-10-CM | POA: Diagnosis not present

## 2019-02-14 ENCOUNTER — Other Ambulatory Visit: Payer: Self-pay

## 2019-02-15 ENCOUNTER — Ambulatory Visit (INDEPENDENT_AMBULATORY_CARE_PROVIDER_SITE_OTHER): Payer: PPO | Admitting: Family Medicine

## 2019-02-15 ENCOUNTER — Encounter: Payer: Self-pay | Admitting: Family Medicine

## 2019-02-15 ENCOUNTER — Telehealth: Payer: Self-pay | Admitting: Family Medicine

## 2019-02-15 VITALS — BP 151/76 | HR 65 | Temp 98.9°F | Ht 67.0 in | Wt 285.4 lb

## 2019-02-15 DIAGNOSIS — I10 Essential (primary) hypertension: Secondary | ICD-10-CM | POA: Diagnosis not present

## 2019-02-15 DIAGNOSIS — E1142 Type 2 diabetes mellitus with diabetic polyneuropathy: Secondary | ICD-10-CM | POA: Diagnosis not present

## 2019-02-15 DIAGNOSIS — N1831 Chronic kidney disease, stage 3a: Secondary | ICD-10-CM | POA: Diagnosis not present

## 2019-02-15 DIAGNOSIS — E119 Type 2 diabetes mellitus without complications: Secondary | ICD-10-CM

## 2019-02-15 LAB — BAYER DCA HB A1C WAIVED: HB A1C (BAYER DCA - WAIVED): 8 % — ABNORMAL HIGH (ref <7.0)

## 2019-02-15 MED ORDER — RIZATRIPTAN BENZOATE 5 MG PO TABS: 5.0000 mg | ORAL_TABLET | ORAL | 0 refills | Status: DC | PRN

## 2019-02-15 MED ORDER — ATORVASTATIN CALCIUM 40 MG PO TABS: 40.0000 mg | ORAL_TABLET | Freq: Every day | ORAL | 3 refills | Status: DC

## 2019-02-15 MED ORDER — GLIMEPIRIDE 4 MG PO TABS: 4.0000 mg | ORAL_TABLET | Freq: Three times a day (TID) | ORAL | 3 refills | Status: DC

## 2019-02-15 NOTE — Telephone Encounter (Signed)
Patient aware and verbalized understanding.

## 2019-02-15 NOTE — Telephone Encounter (Signed)
Patient followed up with Eye doctor for the floaters like you told him. She said it was age related. Do you think it could be anything else. Pt state he has headaches with and with out floaters. Patient did get new glasses help with blurry vision, but not headache or floaters bright lights make the headache worse with loud noises.

## 2019-02-15 NOTE — Telephone Encounter (Signed)
I sent in a medication called Maxalt for him that he can use when he has a headache, this is a migraine treatment so have him try it and see if it works, he can only use it once or twice a day no more than twice a day.

## 2019-02-15 NOTE — Progress Notes (Signed)
BP (!) 151/76   Pulse 65   Temp 98.9 F (37.2 C) (Temporal)   Ht _0  (1.702 m)   Wt 285 lb 6.4 oz (129.5 kg)   SpO2 96%   BMI 44.70 kg/m    Subjective:   Patient ID: Kenneth Fuller, male    DOB: 02/22/1945, 74 y.o.   MRN: [id number]  HPI: Kenneth Fuller is a 74 y.o. male presenting on 02/15/2019 for Diabetes (3 month follow up), Headache (Patient states since he went to the ER on 12/11 he has been getting a lot of headaches and eye floaters ), and trouble sleeping   HPI Type 2 diabetes mellitus Patient comes in today for recheck of his diabetes. Patient has been currently taking glimepiride. Patient is currently on an ACE inhibitor/ARB. Patient has not seen an ophthalmologist this year. Patient denies any issues with their feet.  Patient also has neuropathy and stage III CKD that is stable.  Hypertension Patient is currently on hydralazine and bisoprolol and isosorbide dinitrate and lisinopril, and their blood pressure today is 151/76. Patient denies any lightheadedness or dizziness. Patient denies chest pains, shortness of breath, or weakness. Denies any side effects from medication and is content with current medication.   Patient has been having some headaches and floaters and trouble sleeping just recently, he did get his vision checked a couple months ago but not recently.  He said this is been going on over the past month.  Relevant past medical, surgical, family and social history reviewed and updated as indicated. Interim medical history since our last visit reviewed. Allergies and medications reviewed and updated.  Review of Systems  Constitutional: Negative for chills and fever.  Eyes: Positive for visual disturbance.  Respiratory: Negative for shortness of breath and wheezing.   Cardiovascular: Negative for chest pain and leg swelling.  Musculoskeletal: Negative for back pain and gait problem.  Skin: Negative for rash.  Neurological: Positive for headaches.  Negative for dizziness, weakness and light-headedness.  All other systems reviewed and are negative.   Per HPI unless specifically indicated above   Allergies as of 02/15/2019      Reactions   Amlodipine Besy-benazepril Hcl Swelling, Other (See Comments)   Makes tongue swell (lotrel)   Oxycodone Itching   Phenergan [promethazine Hcl] Other (See Comments)   "I can't remember."   Hydrocodone Itching   Can tolerate in low doses      Medication List       Accurate as of February 15, 2019  1:47 PM. If you have any questions, ask your nurse or doctor.        STOP taking these medications   azithromycin 250 MG tablet Commonly known as: Zithromax Z-Pak Stopped by: [name] Yahel Fuston, MD   predniSONE 10 MG tablet Commonly known as: DELTASONE Stopped by: [name] Caron Ode, MD   zolpidem 12.5 MG CR tablet Commonly known as: AMBIEN CR Stopped by: [name] Chloey Ricard, MD     TAKE these medications   Accu-Chek Aviva device Use as instructed   albuterol 108 (90 Base) MCG/ACT inhaler Commonly known as: VENTOLIN HFA Inhale 2 puffs into the lungs every 6 (six) hours as needed for wheezing or shortness of breath.   atorvastatin 40 MG tablet Commonly known as: LIPITOR Take 1 tablet (40 mg total) by mouth daily.   bisoprolol 5 MG tablet Commonly known as: ZEBETA Take 0.5 tablets (2.5 mg total) by mouth daily.   Blood Pressure Cuff Misc 1 each  by Does not apply route daily.   budesonide-formoterol 160-4.5 MCG/ACT inhaler Commonly known as: Symbicort Inhale 2 puffs into the lungs 2 (two) times daily.   clonazePAM 0.5 MG tablet Commonly known as: KLONOPIN Take 1 tablet (0.5 mg total) by mouth at bedtime as needed.   furosemide 40 MG tablet Commonly known as: LASIX For a weight of 270 or higher, take 80 mg twice daily. For a weight of 269 and lower, take 80 mg in the morning and 40 mg in the evening.   glimepiride 4 MG tablet Commonly known as: AMARYL Take 1 tablet (4 mg  total) by mouth 3 (three) times daily. What changed: See the new instructions. Changed by: [name] Sofia Vanmeter, MD   hydrALAZINE 10 MG tablet Commonly known as: APRESOLINE Take 1 tablet (10 mg total) by mouth 3 (three) times daily.   hydroxychloroquine 200 MG tablet Commonly known as: Plaquenil Take 2 tablets (400 mg total) by mouth daily.   isosorbide dinitrate 30 MG tablet Commonly known as: ISORDIL Take 1 tablet (30 mg total) by mouth 3 (three) times daily.   lisinopril 10 MG tablet Commonly known as: ZESTRIL Take 1 tablet (10 mg total) by mouth daily.   mometasone-formoterol 100-5 MCG/ACT Aero Commonly known as: DULERA Inhale 2 puffs into the lungs 2 (two) times daily.   OneTouch Delica Lancets 18E Misc 1 each by Does not apply route daily. Test 1X per day and prn   accu-chek soft touch lancets 1 each by Other route daily. Use as instructed   OXYGEN Inhale 2 L into the lungs at bedtime. 2lpm with sleep   Potassium Chloride ER 20 MEQ Tbcr Take 1 tablet by mouth daily.   Prodigy No Coding Blood Gluc test strip Generic drug: glucose blood CHECK BLOOD SUGARS ONCE DAILY   glucose blood test strip Commonly known as: Accu-Chek Aviva 1 each by Other route daily. Use as instructed   Pulse Oximeter Misc 1 each by Does not apply route continuous as needed.   sertraline 50 MG tablet Commonly known as: ZOLOFT Take 1 tablet (50 mg total) by mouth daily.   traMADol 50 MG tablet Commonly known as: ULTRAM Take 1 tablet (50 mg total) by mouth every 6 (six) hours as needed.   VITAMIN D PO Take 5,000 Units by mouth every morning.        Objective:   BP (!) 151/76   Pulse 65   Temp 98.9 F (37.2 C) (Temporal)   Ht _0  (1.702 m)   Wt 285 lb 6.4 oz (129.5 kg)   SpO2 96%   BMI 44.70 kg/m   Wt Readings from Last 3 Encounters:  02/15/19 285 lb 6.4 oz (129.5 kg)  01/19/19 280 lb (127 kg)  12/28/18 275 lb (124.7 kg)    Physical Exam Vitals and nursing note  reviewed.  Constitutional:      General: He is not in acute distress.    Appearance: He is well-developed. He is not diaphoretic.  Eyes:     General: No scleral icterus.    Conjunctiva/sclera: Conjunctivae normal.  Neck:     Thyroid: No thyromegaly.  Cardiovascular:     Rate and Rhythm: Normal rate and regular rhythm.     Heart sounds: Normal heart sounds. No murmur.  Pulmonary:     Effort: Pulmonary effort is normal. No respiratory distress.     Breath sounds: Normal breath sounds. No wheezing.  Musculoskeletal:     Cervical back: Neck supple.  Lymphadenopathy:  Cervical: No cervical adenopathy.  Skin:    General: Skin is warm and dry.     Findings: No rash.  Neurological:     Mental Status: He is alert and oriented to person, place, and time.     Coordination: Coordination normal.  Psychiatric:        Behavior: Behavior normal.       Assessment & Plan:   Problem List Items Addressed This Visit      Cardiovascular and Mediastinum   Essential hypertension   Relevant Medications   atorvastatin (LIPITOR) 40 MG tablet   Other Relevant Orders   CBC with Differential/Platelet   CMP14+EGFR   Lipid panel     Endocrine   Diabetes mellitus type II, non insulin dependent (HCC) - Primary   Relevant Medications   atorvastatin (LIPITOR) 40 MG tablet   glimepiride (AMARYL) 4 MG tablet   Other Relevant Orders   hgba1c   CBC with Differential/Platelet   CMP14+EGFR   Lipid panel     Genitourinary   CKD (chronic kidney disease), stage III   Relevant Orders   CBC with Differential/Platelet   CMP14+EGFR   Lipid panel    Other Visit Diagnoses    Type 2 diabetes mellitus with diabetic polyneuropathy, without long-term current use of insulin (HCC)       Relevant Medications   atorvastatin (LIPITOR) 40 MG tablet   glimepiride (AMARYL) 4 MG tablet      Patient complains of headache with floaters, recommended for him to go back to the optometrist and reexamined again as  it has worsened.  Is possibility for migraines but with the floaters I want him to be examined again for the eyes.  A1c was up at 8.0 but likely because as well as steroids, gave him a sample of Basaglar to use 5 units daily to see how he is doing with that. Follow up plan: Return in about 3 months (around 05/16/2019), or if symptoms worsen or fail to improve, for Diabetes and hypertension recheck.  Counseling provided for all of the vaccine components Orders Placed This Encounter  Procedures  . hgba1c  . CBC with Differential/Platelet  . CMP14+EGFR  . Lipid panel    Caryl Pina, MD [facility] 02/15/2019, 1:47 PM

## 2019-02-16 LAB — CBC WITH DIFFERENTIAL/PLATELET
Basophils Absolute: 0.1 10*3/uL (ref 0.0–0.2)
Basos: 1 %
EOS (ABSOLUTE): 0.3 10*3/uL (ref 0.0–0.4)
Eos: 3 %
Hematocrit: 38.6 % (ref 37.5–51.0)
Hemoglobin: 12.3 g/dL — ABNORMAL LOW (ref 13.0–17.7)
Immature Grans (Abs): 0 10*3/uL (ref 0.0–0.1)
Immature Granulocytes: 0 %
Lymphocytes Absolute: 2.5 10*3/uL (ref 0.7–3.1)
Lymphs: 28 %
MCH: 26.2 pg — ABNORMAL LOW (ref 26.6–33.0)
MCHC: 31.9 g/dL (ref 31.5–35.7)
MCV: 82 fL (ref 79–97)
Monocytes Absolute: 0.8 10*3/uL (ref 0.1–0.9)
Monocytes: 9 %
Neutrophils Absolute: 5.2 10*3/uL (ref 1.4–7.0)
Neutrophils: 59 %
Platelets: 245 10*3/uL (ref 150–450)
RBC: 4.7 x10E6/uL (ref 4.14–5.80)
RDW: 15.3 % (ref 11.6–15.4)
WBC: 8.8 10*3/uL (ref 3.4–10.8)

## 2019-02-16 LAB — CMP14+EGFR
ALT: 16 IU/L (ref 0–44)
AST: 17 IU/L (ref 0–40)
Albumin/Globulin Ratio: 1.9 (ref 1.2–2.2)
Albumin: 4.2 g/dL (ref 3.7–4.7)
Alkaline Phosphatase: 96 IU/L (ref 39–117)
BUN/Creatinine Ratio: 12 (ref 10–24)
BUN: 14 mg/dL (ref 8–27)
Bilirubin Total: 0.3 mg/dL (ref 0.0–1.2)
CO2: 27 mmol/L (ref 20–29)
Calcium: 9.2 mg/dL (ref 8.6–10.2)
Chloride: 100 mmol/L (ref 96–106)
Creatinine, Ser: 1.14 mg/dL (ref 0.76–1.27)
GFR calc Af Amer: 74 mL/min/{1.73_m2} (ref >59)
GFR calc non Af Amer: 64 mL/min/{1.73_m2} (ref >59)
Globulin, Total: 2.2 g/dL (ref 1.5–4.5)
Glucose: 169 mg/dL — ABNORMAL HIGH (ref 65–99)
Potassium: 4.6 mmol/L (ref 3.5–5.2)
Sodium: 140 mmol/L (ref 134–144)
Total Protein: 6.4 g/dL (ref 6.0–8.5)

## 2019-02-16 LAB — LIPID PANEL
Chol/HDL Ratio: 2.1 ratio (ref 0.0–5.0)
Cholesterol, Total: 81 mg/dL — ABNORMAL LOW (ref 100–199)
HDL: 38 mg/dL — ABNORMAL LOW (ref >39)
LDL Chol Calc (NIH): 19 mg/dL (ref 0–99)
Triglycerides: 142 mg/dL (ref 0–149)
VLDL Cholesterol Cal: 24 mg/dL (ref 5–40)

## 2019-02-17 DIAGNOSIS — R0902 Hypoxemia: Secondary | ICD-10-CM | POA: Diagnosis not present

## 2019-02-17 DIAGNOSIS — R0600 Dyspnea, unspecified: Secondary | ICD-10-CM | POA: Diagnosis not present

## 2019-02-23 ENCOUNTER — Other Ambulatory Visit: Payer: Self-pay | Admitting: Physician Assistant

## 2019-02-27 ENCOUNTER — Ambulatory Visit: Payer: PPO | Admitting: *Deleted

## 2019-02-27 ENCOUNTER — Ambulatory Visit (INDEPENDENT_AMBULATORY_CARE_PROVIDER_SITE_OTHER): Payer: PPO | Admitting: Licensed Clinical Social Worker

## 2019-02-27 DIAGNOSIS — J449 Chronic obstructive pulmonary disease, unspecified: Secondary | ICD-10-CM | POA: Diagnosis not present

## 2019-02-27 DIAGNOSIS — E119 Type 2 diabetes mellitus without complications: Secondary | ICD-10-CM

## 2019-02-27 DIAGNOSIS — I5042 Chronic combined systolic (congestive) and diastolic (congestive) heart failure: Secondary | ICD-10-CM

## 2019-02-27 DIAGNOSIS — J441 Chronic obstructive pulmonary disease with (acute) exacerbation: Secondary | ICD-10-CM

## 2019-02-27 DIAGNOSIS — F339 Major depressive disorder, recurrent, unspecified: Secondary | ICD-10-CM

## 2019-02-27 DIAGNOSIS — I2510 Atherosclerotic heart disease of native coronary artery without angina pectoris: Secondary | ICD-10-CM

## 2019-02-27 NOTE — Patient Instructions (Addendum)
Licensed Clinical Social Worker Visit Information  Goals we discussed today:  Goals        . Client has stress related to ongoing health issues and wishes to talk more about stress over current health issues (pt-stated)     Current Barriers:  [name] Mental Health Concerns  in patient with Chronic Diagnoses of CAD, DM Type II, Depression,recurrent, CKD, COPD and CHF .  Challenges related to managing health issues of client   Clinical Social Work Clinical Goal(s):  [name] Over the next 30 days, client will work with LCSW to address concerns related to health issues of client and client management of health issues faced  Interventions: Talked with client about ambulation challenges of client  Talked with client about pain issues of client  Talked with client about breathing issues of client  Talked with client about client's upcoming client appointments (client said he has appointment scheduled with neurologist on 03/18/2019.)  Talked with client about his current headaches (client is taking pain medication as prescribed)  Talked with client about recent eye exam of client  Patient Self Care Activities:  . Self administers medications as prescribed . Attends all scheduled provider appointments . Performs ADL's independently   Plan:  LCSW to call client in next 4 weeks to talk with client about stress issues related to his management of health issues faced Client to communicate with RNCM to discuss nursing needs of client Client to attend scheduled client medical appointments   Initial goal documentation            Materials Provided: No  Follow Up Plan: LCSW to call client in next 4 weeks to talk with client about stress issues related to client management of health issues faced  The patient verbalized understanding of instructions provided today and declined a print copy of patient instruction materials.   [name].Kahdijah Errickson MSW, LCSW Licensed Clinical Social Worker [facility] [phone]

## 2019-02-27 NOTE — Chronic Care Management (AMB) (Signed)
Chronic Care Management   Care Coordination Note   02/27/2019 Name: Kenneth Fuller MRN: [id number] DOB: 09-07-1945  Referred by: [name], [name], MD Reason for referral : Chronic Care Management (Care coordination)   Kenneth Fuller is a 74 y.o. year old male who is a primary care patient of [name], [name], MD. The CCM team was consulted for assistance with chronic disease management and care coordination needs.    Review of patient status, including review of consultants reports, relevant laboratory and other test results, and collaboration with appropriate care team members and the patient's provider was performed as part of comprehensive patient evaluation and provision of chronic care management services.     Outpatient Encounter Medications as of 02/27/2019  Medication Sig  . albuterol (PROVENTIL HFA;VENTOLIN HFA) 108 (90 Base) MCG/ACT inhaler Inhale 2 puffs into the lungs every 6 (six) hours as needed for wheezing or shortness of breath.  [name] atorvastatin (LIPITOR) 40 MG tablet Take 1 tablet (40 mg total) by mouth daily.  . bisoprolol (ZEBETA) 5 MG tablet Take 0.5 tablets (2.5 mg total) by mouth daily.  . Blood Glucose Monitoring Suppl (ACCU-CHEK AVIVA) device Use as instructed  . Blood Pressure Monitoring (BLOOD PRESSURE CUFF) MISC 1 each by Does not apply route daily.  . budesonide-formoterol (SYMBICORT) 160-4.5 MCG/ACT inhaler Inhale 2 puffs into the lungs 2 (two) times daily.  . Cholecalciferol (VITAMIN D PO) Take 5,000 Units by mouth every morning.   . clonazePAM (KLONOPIN) 0.5 MG tablet Take 1 tablet (0.5 mg total) by mouth at bedtime as needed.  . furosemide (LASIX) 40 MG tablet Take 2 tablets by mouth twice daily  . glimepiride (AMARYL) 4 MG tablet Take 1 tablet (4 mg total) by mouth 3 (three) times daily.  [name] glucose blood (ACCU-CHEK AVIVA) test strip 1 each by Other route daily. Use as instructed  . hydrALAZINE (APRESOLINE) 10 MG tablet Take 1 tablet (10 mg  total) by mouth 3 (three) times daily.  . hydroxychloroquine (PLAQUENIL) 200 MG tablet Take 2 tablets (400 mg total) by mouth daily.  . isosorbide dinitrate (ISORDIL) 30 MG tablet Take 1 tablet (30 mg total) by mouth 3 (three) times daily.  . Lancets (ACCU-CHEK SOFT TOUCH) lancets 1 each by Other route daily. Use as instructed  . lisinopril (ZESTRIL) 10 MG tablet Take 1 tablet (10 mg total) by mouth daily.  . Misc. Devices (PULSE OXIMETER) MISC 1 each by Does not apply route continuous as needed.  . mometasone-formoterol (DULERA) 100-5 MCG/ACT AERO Inhale 2 puffs into the lungs 2 (two) times daily.  Kenneth Fuller DELICA LANCETS 59X MISC 1 each by Does not apply route daily. Test 1X per day and prn  . OXYGEN Inhale 2 L into the lungs at bedtime. 2lpm with sleep   . Potassium Chloride ER 20 MEQ TBCR Take 1 tablet by mouth daily.  [name] PRODIGY NO CODING BLOOD GLUC test strip CHECK BLOOD SUGARS ONCE DAILY  . rizatriptan (MAXALT) 5 MG tablet Take 1 tablet (5 mg total) by mouth as needed for migraine. May repeat in 2 hours if needed  . sertraline (ZOLOFT) 50 MG tablet Take 1 tablet (50 mg total) by mouth daily.  . traMADol (ULTRAM) 50 MG tablet Take 1 tablet (50 mg total) by mouth every 6 (six) hours as needed.   No facility-administered encounter medications on file as of 02/27/2019.      RN Care Plan   . Prescription Assistance (pt-stated)  Prescription assistance needs in a patient with diabetes, asthma, HTN, and OSA.  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   Nurse Case Manager Clinical Goal(s):  [name] Over the next 30 days, patient will meet with RN Care Manager to address prescription assistance form completion for 2021  Interventions:  . Contacted by Theadore Nan, LCSW after he spoke with patient today o Patient's [employer] application for prescription assistance was denied and he has not heard back since he sent in the appeal  letter . Verified that he is not out of medication . RN to f/u with patient regarding appeal over the next 2 weeeks  Patient Self Care Activities:  . Performs ADL's independently . Performs IADL's independently . Unable to independently n/a  Please see past updates related to this goal by clicking on the "Past Updates" button in the selected goal          Plan:   The care management team will reach out to the patient again over the next 14 days.    [name], BSN, RN-BC Embedded Chronic Care Manager [facility] Direct Dial: [phone]

## 2019-02-27 NOTE — Patient Instructions (Signed)
Visit Information  Goals Addressed            This Visit's Progress     Patient Stated   . Prescription Assistance (pt-stated)       Prescription assistance needs in a patient with diabetes, asthma, HTN, and OSA.  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   Nurse Case Manager Clinical Goal(s):  [name] Over the next 30 days, patient will meet with RN Care Manager to address prescription assistance form completion for 2021  Interventions:  . Contacted by Theadore Nan, LCSW after he spoke with patient today o Patient's [employer] application for prescription assistance was denied and he has not heard back since he sent in the appeal letter . Verified that he is not out of medication . RN to f/u with patient regarding appeal over the next 2 weeeks  Patient Self Care Activities:  . Performs ADL's independently . Performs IADL's independently . Unable to independently n/a  Please see past updates related to this goal by clicking on the "Past Updates" button in the selected goal        The care management team will reach out to the patient again over the next 14 days.   [name], BSN, RN-BC Embedded Chronic Care Manager [facility] [place] [facility] Direct Dial: [phone]

## 2019-02-27 NOTE — Chronic Care Management (AMB) (Signed)
Care Management Note   Kenneth Fuller is a 74 y.o. year old male who is a primary care patient of [name], [name], MD. The CM team was consulted for assistance with chronic disease management and care coordination.   I reached out to [name] by phone today.     Review of patient status, including review of consultants reports, relevant laboratory and other test results, and collaboration with appropriate care team members and the patient's provider was performed as part of comprehensive patient evaluation and provision of chronic care management services.  Social determinants of health: risk of tobacco use; risk of depression; risk of physical inactivity    Office Visit from 12/14/2017 in [facility]  PHQ-9 Total Score  12     GAD 7 : Generalized Anxiety Score 11/11/2017  Nervous, Anxious, on Edge 3  Control/stop worrying 3  Worry too much - different things 3  Trouble relaxing 3  Restless 3  Easily annoyed or irritable 3  Afraid - awful might happen 0  Total GAD 7 Score 18  Anxiety Difficulty Somewhat difficult   Medications   (very important)  New medications from outside sources are available for reconciliation  albuterol (PROVENTIL HFA;VENTOLIN HFA) 108 (90 Base) MCG/ACT inhaler atorvastatin (LIPITOR) 40 MG tablet bisoprolol (ZEBETA) 5 MG tablet Blood Glucose Monitoring Suppl (ACCU-CHEK AVIVA) device Blood Pressure Monitoring (BLOOD PRESSURE CUFF) MISC budesonide-formoterol (SYMBICORT) 160-4.5 MCG/ACT inhaler Cholecalciferol (VITAMIN D PO) clonazePAM (KLONOPIN) 0.5 MG tablet furosemide (LASIX) 40 MG tablet glimepiride (AMARYL) 4 MG tablet glucose blood (ACCU-CHEK AVIVA) test strip hydrALAZINE (APRESOLINE) 10 MG tablet hydroxychloroquine (PLAQUENIL) 200 MG tablet isosorbide dinitrate (ISORDIL) 30 MG tablet Lancets (ACCU-CHEK SOFT TOUCH) lancets lisinopril (ZESTRIL) 10 MG tablet Misc. Devices (PULSE OXIMETER)  MISC mometasone-formoterol (DULERA) 100-5 MCG/ACT AERO ONETOUCH DELICA LANCETS 33I MISC OXYGEN Potassium Chloride ER 20 MEQ TBCR PRODIGY NO CODING BLOOD GLUC test strip rizatriptan (MAXALT) 5 MG tablet sertraline (ZOLOFT) 50 MG tablet traMADol (ULTRAM) 50 MG tablet  GOAL:  .     [name] Client has stress related to ongoing health issues and wishes to talk more about stress over current health issues (pt-stated)     Current Barriers:  [name] Mental Health Concerns  in patient with Chronic Diagnoses of CAD, DM Type II, Depression,recurrent, CKD, COPD and CHF .  Challenges related to managing health issues of client   Clinical Social Work Clinical Goal(s):  [name] Over the next 30 days, client will work with LCSW to address concerns related to health issues of client and client management of health issues faced  Interventions:  Talked with client about ambulation challenges of client   Talked with client about pain issues of client   Talked with client about breathing issues of client  Talked with client about client's upcoming client appointments (client said he has appointment scheduled with neurologist on 03/18/2019.)  Talked with client about his current headaches (client is taking pain medication as prescribed)  Talked with client about recent eye exam of client  Patient Self Care Activities:  . Self administers medications as prescribed . Attends all scheduled provider appointments . Performs ADL's independently   Plan:  LCSW to call client in next 4 weeks to talk with client about stress issues related to his management of health issues faced Client to communicate with RNCM to discuss nursing needs of client Client to attend scheduled client medical appointments   Initial goal documentation        Follow Up  Plan: LCSW to call client in next 4 weeks to talk with client about stress issues related to his management of health issues faced  [name].Winfield Caba MSW, LCSW Licensed  Clinical Social Worker [facility] Care Management [phone]

## 2019-03-06 ENCOUNTER — Ambulatory Visit: Payer: PPO | Admitting: *Deleted

## 2019-03-06 NOTE — Chronic Care Management (AMB) (Signed)
  Chronic Care Management   Outreach Note  03/06/2019 Name: Kenneth Fuller MRN: [id number] DOB: 1945-02-26  Referred by: [name], [name], MD Reason for referral : Chronic Care Management (RN follow up)   An unsuccessful telephone follow-up was attempted today. The patient was referred to the case management team by for assistance with care management and care coordination.   Follow Up Plan: The care management team will reach out to the patient again over the next 15 days.   [name], BSN, RN-BC Embedded Chronic Care Manager [facility] [place] [facility] Direct Dial: [phone]

## 2019-03-09 ENCOUNTER — Other Ambulatory Visit: Payer: Self-pay

## 2019-03-09 ENCOUNTER — Encounter: Payer: Self-pay | Admitting: Medical

## 2019-03-09 ENCOUNTER — Telehealth: Payer: Self-pay | Admitting: Family Medicine

## 2019-03-09 ENCOUNTER — Ambulatory Visit: Payer: PPO | Admitting: Family Medicine

## 2019-03-09 VITALS — BP 186/94 | HR 75 | Temp 97.7°F | Ht 67.0 in | Wt 287.0 lb

## 2019-03-09 DIAGNOSIS — E785 Hyperlipidemia, unspecified: Secondary | ICD-10-CM

## 2019-03-09 DIAGNOSIS — I10 Essential (primary) hypertension: Secondary | ICD-10-CM

## 2019-03-09 DIAGNOSIS — E1169 Type 2 diabetes mellitus with other specified complication: Secondary | ICD-10-CM

## 2019-03-09 DIAGNOSIS — I5042 Chronic combined systolic (congestive) and diastolic (congestive) heart failure: Secondary | ICD-10-CM | POA: Diagnosis not present

## 2019-03-09 DIAGNOSIS — Z79899 Other long term (current) drug therapy: Secondary | ICD-10-CM

## 2019-03-09 DIAGNOSIS — I25810 Atherosclerosis of coronary artery bypass graft(s) without angina pectoris: Secondary | ICD-10-CM

## 2019-03-09 DIAGNOSIS — E669 Obesity, unspecified: Secondary | ICD-10-CM

## 2019-03-09 DIAGNOSIS — G4733 Obstructive sleep apnea (adult) (pediatric): Secondary | ICD-10-CM

## 2019-03-09 MED ORDER — LISINOPRIL 20 MG PO TABS: 20.0000 mg | ORAL_TABLET | Freq: Every day | ORAL | 0 refills | Status: DC

## 2019-03-09 NOTE — Telephone Encounter (Signed)
The 150/90 sounds better, please have him keep an eye on it and every time he can check it or have them, check it over the next week and let me know the numbers.

## 2019-03-09 NOTE — Telephone Encounter (Signed)
Pt saw cardiologist today and he increased his Lisinopril to 20 mg and goes back out there in 2 wks

## 2019-03-09 NOTE — Telephone Encounter (Signed)
Katie from [facility] called to let Dr [name] know that she made a home visit to see patient today and when she checked his BP it was 152/96. Says patient then took his BP medication and 30 minutes later patients BP was still 150/90 while resting.   Also wanted to let Dr [name] know that they were working on getting patient a Nebulizer since patient is still having shortness of breath.

## 2019-03-09 NOTE — Patient Instructions (Signed)
Medication Instructions:   INCREASE LISINOPRIL TO 20 MG DAILY *If you need a refill on your cardiac medications before your next appointment, please call your pharmacy*  Lab Work: Your physician recommends that you return for lab work in 2 WEEKS:  BMET If you have labs (blood work) drawn today and your tests are completely normal, you will receive your results only by: [name] MyChart Message (if you have MyChart) OR . A paper copy in the mail If you have any lab test that is abnormal or we need to change your treatment, we will call you to review the results.  Testing/Procedures: NONE ordered at this time of appointment   Follow-Up: At [facility], you and your health needs are our priority.  As part of our continuing mission to provide you with exceptional heart care, we have created designated Provider Care Teams.  These Care Teams include your primary Cardiologist (physician) and Advanced Practice Providers (APPs -  Physician Assistants and Nurse Practitioners) who all work together to provide you with the care you need, when you need it.  Your next appointment:   6 month(s)  The format for your next appointment:   In Person  Provider:   Sanda Klein, MD  Other Instructions  You will need to be schedule with [facility] in 1-2 weeks

## 2019-03-09 NOTE — Progress Notes (Signed)
Cardiology Office Note  Date: 03/09/2019   ID: OMARRI EICH, DOB 1945/07/12, MRN [id number]  PCP:  [name], [name], MD  Cardiologist:  Sanda Klein, MD Electrophysiologist:  None   No chief complaint on file.   History of Present Illness: KOEN ANTILLA is a 74 y.o. male last encounter with Dr. Sallyanne Kuster October 17, 2018 follow-up for chest pain.  Patient has a history of multiple medical problems including chronic combined systolic and diastolic heart failure, morbid obesity, obstructive sleep apnea, hyperlipidemia, type 2 diabetes, nonobstructive coronary artery disease, restrictive and obstructive lung disease, hyperlipidemia, hypertension, COPD.   Cardiac catheterization October 2018 again showed minimal CAD.  He has been preserved left ventricular systolic function.  However he did have grade 2 diastolic dysfunction.  He did had a pulmonary function test showing fairly severe obstructive lung disease with FEV1 in the 40 to 50% of predicted range.  Previous history of smoking for approximately 5 years remotely.  Chest CT did not show any evidence of interstitial lung disease.  He had another hospital stay for significant shortness of breath starting November 27 to 09 January 2018.  His BNP was 330.  Previous baseline was 72.6 in 2017.  His echocardiogram during that visit showed ejection fraction had decreased to 45 to 50%.  Patient had been seen September 22, 2018 for complaints of substernal chest tightness.  CT angiography did not show PE but showed presence of aortic and coronary atherosclerosis.  High-sensitivity troponin was very low.  EKG showed no ischemic changes.  Cardiac cath in October 2018 which showed a maximal stenosis of 10% in the mid LAD and mid RCA.  Echocardiogram systolic function was an EF of 50 to 70% and diastolic function was indeterminate filling pressures.  At the hospital visit he was aggressively treated with diuretics and diuresed around 10 L of  fluid.His "dry weight" appears to be somewhere around 207 pounds.  At this office visit he was back to his baseline of [place] heart association functional class II.  He denied any dyspnea or lower extremity edema during that visit.  Patient fell back in early December striking the back of his head on the floor with dizziness and head pain CT scan showed no acute abnormality or skull fracture.  Has evidence of previous surgery with anterior fusion hardware from C3-C7.  Previous lucencies within the posterior arch of C1 are no longer seen, likely healed fractures.  Patient denies any recent acute illnesses, hospitalizations, surgeries, or travels.  States he has been having trouble with headaches and floaters in his eyes.  He is recently had an eye exam and new prescription for eyeglasses.  States he has some issues with dyspnea on exertion.  States when walking from his automobile to the office he became winded and short of breath.  He is not very active on a daily basis.  He has sleep apnea but is not very compliant with the device.  He describes it as cumbersome and a nuisance at times.  From November 15, 2018 til  today's date his weight has increased approximately 10 pounds.  He describes the holidays as being a hard time to watch his diet.  He has had 2 previous hospital admissions for heart failure.  . Past Medical History:  Diagnosis Date  . Allergy   . Asthma   . CAD (coronary artery disease)   . CHF (congestive heart failure) ([place])   . Diabetes mellitus   . Fibromyalgia   .  GERD (gastroesophageal reflux disease)   . GI bleeding   . Gout   . Hyperlipidemia   . Hypertension   . Hypogonadism male   . Insomnia   . MRSA cellulitis   . Neuropathy   . Obesity   . Shortness of breath dyspnea    with exertion   . Sleep apnea    cpap- 14   . Wheezing    no asthma diagnosis    Past Surgical History:  Procedure Laterality Date  . BACK SURGERY    . CARDIAC CATHETERIZATION    .  COLONOSCOPY    . LEFT HEART CATH AND CORONARY ANGIOGRAPHY N/A 12/02/2016   Procedure: LEFT HEART CATH AND CORONARY ANGIOGRAPHY;  Surgeon: Jettie Booze, MD;  Location: [facility];  Service: Cardiovascular;  Laterality: N/A;  . LUMBAR LAMINECTOMY/DECOMPRESSION MICRODISCECTOMY N/A 01/02/2014   Procedure: CENTRAL DECOMPRESSION LUMBAR LAMINECTOMY L3-L4, L4-L5;  Surgeon: Tobi Bastos, MD;  Location: [facility];  Service: Orthopedics;  Laterality: N/A;  . neck fusion      Current Outpatient Medications  Medication Sig Dispense Refill  . albuterol (PROVENTIL HFA;VENTOLIN HFA) 108 (90 Base) MCG/ACT inhaler Inhale 2 puffs into the lungs every 6 (six) hours as needed for wheezing or shortness of breath. 1 Inhaler 2  . atorvastatin (LIPITOR) 40 MG tablet Take 1 tablet (40 mg total) by mouth daily. 90 tablet 3  . bisoprolol (ZEBETA) 5 MG tablet Take 0.5 tablets (2.5 mg total) by mouth daily. 45 tablet 3  . Blood Glucose Monitoring Suppl (ACCU-CHEK AVIVA) device Use as instructed 1 each 0  . Blood Pressure Monitoring (BLOOD PRESSURE CUFF) MISC 1 each by Does not apply route daily. 1 each 0  . budesonide-formoterol (SYMBICORT) 160-4.5 MCG/ACT inhaler Inhale 2 puffs into the lungs 2 (two) times daily. 1 Inhaler 3  . Cholecalciferol (VITAMIN D PO) Take 5,000 Units by mouth every morning.     . clonazePAM (KLONOPIN) 0.5 MG tablet Take 1 tablet (0.5 mg total) by mouth at bedtime as needed. 30 tablet 2  . furosemide (LASIX) 40 MG tablet Take 2 tablets by mouth twice daily 270 tablet 0  . glimepiride (AMARYL) 4 MG tablet Take 1 tablet (4 mg total) by mouth 3 (three) times daily. 270 tablet 3  . glucose blood (ACCU-CHEK AVIVA) test strip 1 each by Other route daily. Use as instructed 100 each 12  . hydrALAZINE (APRESOLINE) 10 MG tablet Take 1 tablet (10 mg total) by mouth 3 (three) times daily. 270 tablet 3  . hydroxychloroquine (PLAQUENIL) 200 MG tablet Take 2 tablets (400 mg total) by mouth daily.  40 tablet 0  . isosorbide dinitrate (ISORDIL) 30 MG tablet Take 1 tablet (30 mg total) by mouth 3 (three) times daily. 90 tablet 3  . Lancets (ACCU-CHEK SOFT TOUCH) lancets 1 each by Other route daily. Use as instructed 100 each 12  . lisinopril (ZESTRIL) 10 MG tablet Take 1 tablet (10 mg total) by mouth daily. 90 tablet 3  . Misc. Devices (PULSE OXIMETER) MISC 1 each by Does not apply route continuous as needed. 1 each 0  . mometasone-formoterol (DULERA) 100-5 MCG/ACT AERO Inhale 2 puffs into the lungs 2 (two) times daily.    [name] DELICA LANCETS 24M MISC 1 each by Does not apply route daily. Test 1X per day and prn 100 each 2  . OXYGEN Inhale 2 L into the lungs at bedtime. 2lpm with sleep     . Potassium Chloride ER 20  MEQ TBCR Take 1 tablet by mouth daily. 90 tablet 3  . PRODIGY NO CODING BLOOD GLUC test strip CHECK BLOOD SUGARS ONCE DAILY 50 each 11  . rizatriptan (MAXALT) 5 MG tablet Take 1 tablet (5 mg total) by mouth as needed for migraine. May repeat in 2 hours if needed 10 tablet 0  . sertraline (ZOLOFT) 50 MG tablet Take 1 tablet (50 mg total) by mouth daily. 90 tablet 1  . traMADol (ULTRAM) 50 MG tablet Take 1 tablet (50 mg total) by mouth every 6 (six) hours as needed. 15 tablet 0   No current facility-administered medications for this visit.   Allergies:  Amlodipine besy-benazepril hcl, Oxycodone, Phenergan [promethazine hcl], and Hydrocodone   Social History: The patient  reports that he quit smoking about 50 years ago. He quit smokeless tobacco use about 50 years ago. He reports that he does not drink alcohol or use drugs.   Family History: The patient's family history includes Colon cancer in his mother; Deep vein thrombosis in his brother; Diabetes in his father; Drug abuse in his sister; Heart attack in his father; Heart attack (age of onset: 69) in his son; Heart disease in his brother, brother, and father; Heart failure in his sister; Kidney disease in his sister.    ROS:  Please see the history of present illness. Otherwise, complete review of systems is positive for none.  All other systems are reviewed and negative.   Physical Exam: VS:  There were no vitals taken for this visit., BMI There is no height or weight on file to calculate BMI.  Wt Readings from Last 3 Encounters:  02/15/19 285 lb 6.4 oz (129.5 kg)  01/19/19 280 lb (127 kg)  12/28/18 275 lb (124.7 kg)    General: Obese patient appears comfortable at rest. Neck: Supple, no elevated JVP or carotid bruits, no thyromegaly. Lungs: Clear to auscultation, nonlabored breathing at rest. Cardiac: Regular rate and rhythm, no S3 or significant systolic murmur, no pericardial rub. Extremities: No pitting edema, distal pulses 2+. Skin: Warm and dry. Neuropsychiatric: Alert and oriented x3, affect grossly appropriate.  ECG:  An ECG dated 01/19/2019 was personally reviewed today and demonstrated:  Sinus rhythm 64, short PR interval, incomplete right bundle branch block  Recent Labwork: 12/28/2018: B Natriuretic Peptide 187.0 02/15/2019: ALT 16; AST 17; BUN 14; Creatinine, Ser 1.14; Hemoglobin 12.3; Platelets 245; Potassium 4.6; Sodium 140     Component Value Date/Time   CHOL 81 (L) 02/15/2019 1403   CHOL 111 06/12/2012 1232   TRIG 142 02/15/2019 1403   TRIG 141 01/24/2014 1143   TRIG 192 (H) 06/12/2012 1232   HDL 38 (L) 02/15/2019 1403   HDL 38 (L) 01/24/2014 1143   HDL 33 (L) 06/12/2012 1232   CHOLHDL 2.1 02/15/2019 1403   CHOLHDL 4.8 11/22/2006 1712   VLDL 39 11/22/2006 1712   LDLCALC 19 02/15/2019 1403   LDLCALC 27 10/25/2013 1224   LDLCALC 40 06/12/2012 1232    Other Studies Reviewed Today:   Echocardiogram September 24, 2018  1. The left ventricle has low normal systolic function, with an ejection fraction of 50-55%. The cavity size was moderately dilated. There is mild concentric left ventricular hypertrophy. Diastolic dysfunction, grade indeterminate. Indeterminate filling  pressures. 2. The right ventricle has normal systolic function. The cavity was normal. There is no increase in right ventricular wall thickness. 3. The aortic valve is tricuspid. Mild thickening of the aortic valve. Mild to moderate aortic annular calcification noted. 4.  The mitral valve is grossly normal. There is mild mitral annular calcification present. 5. The aorta is normal unless otherwise noted. Left Ventricle: The left ventricle has low normal systolic function, with an ejection fraction of 50-55%. The cavity size was moderately dilated. There is mild concentric left ventricular hypertrophy. Diastolic dysfunction, grade indeterminate. Indeterminate filling pressures Right Ventricle: The right ventricle has normal systolic function. The cavity was normal. There is no increase in right ventricular wall thickness. Left Atrium: Left atrial size was normal in size. Right Atrium: Right atrial size was normal in size. Right atrial pressure is estimated at 10 mmHg. Interatrial Septum: No atrial level shunt detected by color flow Doppler. Pericardium: There is no evidence of pericardial effusion. Mitral Valve: The mitral valve is grossly normal. There is mild mitral annular calcification present. Mitral valve regurgitation is trivial by color flow Doppler. Tricuspid Valve: The tricuspid valve is not well visualized. Tricuspid valve regurgitation was not visualized by color flow Doppler Aortic Valve: The aortic valve is tricuspid Mild thickening of the aortic valve. Aortic valve regurgitation was not visualized by color flow Doppler. There is no evidence of aortic valve stenosis. Mild to moderate aortic annular calcification noted. Pulmonic Valve: The pulmonic valve was grossly normal. Pulmonic valve regurgitation is mild by color flow Doppler. Aorta: The aorta is normal unless otherwise noted. Venous: The inferior vena cava is normal in size with greater than 50% respiratory  variability  . Cardiac catheterization 12/02/2016  Mid LAD lesion, 10 %stenosed.  Mid RCA lesion, 10 %stenosed.  The left ventricular systolic function is normal.  LV end diastolic pressure is mildly elevated.  The left ventricular ejection fraction is 55-65% by visual estimate.  There is no aortic valve stenosis.  Mild, nonobstructive coronary atherosclerosis.  Continue aggressive preventive therapy.  Mild volume overload.  Continue diuretics.   Assessment and Plan:  1. Chronic combined systolic and diastolic heart failure ([place])   2. Essential hypertension   3. Chronic obstructive pulmonary disease, unspecified COPD type ([place])   4. OSA (obstructive sleep apnea)   5. Coronary artery disease involving coronary bypass graft of native heart without angina pectoris   6. Dyslipidemia (high LDL; low HDL)   7. Morbid obesity ([place])    1. Chronic combined systolic and diastolic heart failure [facility]) Patient has had 2 previous hospital admissions for acute on chronic diastolic heart failure.  Last hospital admission was in August of last year.  Patient appears to be euvolemic today.  Lungs are clear to auscultation.  He has no lower extremity edema.  Since October 2020 until today his weight is increased from 277 in October 2020 to 287 pounds today's visit.  Continue bisoprolol 5 mg, Lasix 2 tablets by mouth twice daily.  Continue isosorbide dinitrate 30 mg 3 times daily.  May want to switch to Imdur at next visit for once a day dosing.  She cannot Okay to take me sotalol 2.  Essential hypertension Blood pressure on arrival 186/94.  Recheck 160/88.  Patient states his blood pressures been steadily going up since around Christmas.  States averaging around 160s to 180s recently.  Increase lisinopril to 20 mg daily.  Continue hydralazine 10 mg 3 times daily.  4. OSA (obstructive sleep apnea) Patient has obstructive sleep apnea.  He is not very compliant at all with CPAP.  Patient finds it  cumbersome.  States the mask becomes displaced the pressures is so high it causes discomfort.  5. Coronary artery disease involving coronary bypass graft  of native heart without angina pectoris Patient denies any classic angina such as chest pain, pressure, tightness, arm, back, jaw, or neck pain.  Denies any associated nausea or diaphoresis.  Continue isosorbide dinitrate 30 mg 3 times daily.  6. Dyslipidemia (high LDL; low HDL) Recent lipid panel showed a total cholesterol of 81, triglycerides 142, HDL 38, LDL of 19.  Continue Lipitor 40 mg daily.  7. Morbid obesity ([place]) Patient has a history of morbid obesity.  Previous weight in October was 277.  Patient states since the Christmas holidays his weight has increased.  Today's weight is 287.  He is not very active on a daily basis.  8. Diabetes mellitus type 2 in obese [facility]) Patient states his diet has not been particularly good recently due to the holidays.  Recent hemoglobin A1c was 8%.  Advised dietary changes decreasing intake of simple carbohydrates, saturated fats.  Increase activity level.  Encouraged weight loss..  Follow-up with PCP for management.  9. Medication management Get be met in 2 weeks after increasing dose of lisinopril.  Medication Adjustments/Labs and Tests Ordered: Current medicines are reviewed at length with the patient today.  Concerns regarding medicines are outlined above.    There are no Patient Instructions on file for this visit.       Signed, Levell July, NP 03/09/2019 8:31 AM    [facility] [place], [place], [place] [address] Phone: [phone]; Fax: [phone]

## 2019-03-11 NOTE — Telephone Encounter (Signed)
Okay thanks for the information

## 2019-03-14 ENCOUNTER — Other Ambulatory Visit: Payer: Self-pay

## 2019-03-14 ENCOUNTER — Encounter: Payer: Self-pay | Admitting: Neurology

## 2019-03-14 ENCOUNTER — Ambulatory Visit: Payer: PPO | Admitting: Neurology

## 2019-03-14 VITALS — BP 142/84 | HR 68 | Temp 97.3°F | Ht 67.0 in | Wt 284.0 lb

## 2019-03-14 DIAGNOSIS — Z9181 History of falling: Secondary | ICD-10-CM | POA: Diagnosis not present

## 2019-03-14 DIAGNOSIS — H43393 Other vitreous opacities, bilateral: Secondary | ICD-10-CM | POA: Diagnosis not present

## 2019-03-14 DIAGNOSIS — R0902 Hypoxemia: Secondary | ICD-10-CM | POA: Diagnosis not present

## 2019-03-14 DIAGNOSIS — R519 Headache, unspecified: Secondary | ICD-10-CM

## 2019-03-14 DIAGNOSIS — R0600 Dyspnea, unspecified: Secondary | ICD-10-CM | POA: Diagnosis not present

## 2019-03-14 DIAGNOSIS — I5032 Chronic diastolic (congestive) heart failure: Secondary | ICD-10-CM | POA: Diagnosis not present

## 2019-03-14 DIAGNOSIS — G4733 Obstructive sleep apnea (adult) (pediatric): Secondary | ICD-10-CM

## 2019-03-14 DIAGNOSIS — G4489 Other headache syndrome: Secondary | ICD-10-CM

## 2019-03-14 MED ORDER — AMITRIPTYLINE HCL 25 MG PO TABS: ORAL_TABLET | ORAL | 3 refills | Status: DC

## 2019-03-14 NOTE — Patient Instructions (Signed)
I do not see any obvious neurological deficits on your exam.  Nevertheless, for your recurrent headaches I suggest daily preventative medication in the form of Elavil (generic name: amitriptyline) 25 mg: Take half a pill daily at bedtime for one week, then one pill daily at bedtime for one week, then one and a half pills daily at bedtime for one week, then 2 pills daily at bedtime thereafter. Common side effects reported are: mouth dryness, drowsiness, confusion, dizziness.   As discussed, we will proceed with a brain MRI with and without contrast to rule out a structural cause of your recurrent headaches.  Please make a follow-up appointment with your sleep specialist as soon as possible.  You had sleep study testing in October 2017 and as you indicated, you do have severe obstructive sleep apnea which is currently not fully treated as you are not using your CPAP Nightly.  Untreated sleep apnea can cause congestive heart failure, irregular heartbeat, high blood pressure, and recurrent headaches.   Your headaches are probably in part related to untreated obstructive sleep apnea.   You have seen an eye doctor, I would recommend that you see a eye specialist physician, please make an appointment with an ophthalmologist of your choosing.  Please continue to work on weight loss.  Please follow-up in 3 months to see the nurse practitioner.

## 2019-03-14 NOTE — Progress Notes (Signed)
Subjective:    Patient ID: ABBAS BEYENE, male    DOB: 07-Aug-1945, 74 y.o.   MRN: [id number]  HPI   Star Age, MD, PhD [facility] [address]O. [place],  [address]  Dear Dr. Livia Snellen, I saw your patient, Mouhamad Teed, upon your kind request in my neurologic clinic today for initial consultation of his recurrent headaches, with recent history of fall.  Patient is unaccompanied today.  As you know, Mr. Swett is a 74 year old right-handed gentleman with an underlying complex medical history of hypertension, hyperlipidemia, reflux disease, diabetes, CHF, coronary artery disease, sleep apnea, asthma, allergies, history of neuropathy, history of MRSA cellulitis, hypogonadism, low back pain, history of C1 fracture, status post neck surgery, rotator cuff surgery, prior head injury and fall with injuries and morbid obesity with a BMI of over 40, who reports recurrent headaches for the past 2 months.  He does not have a history of migraines.  He had a prescription for Maxalt as needed from Dr. Warrick Parisian.  He felt it was marginally helpful.  He has been taking ibuprofen, nearly daily, he has nearly daily headaches, sometimes he has nausea, he has seen some floaters in his visual field in both eyes.  He had an eye examination with Dr. Marin Comment his optometrist and was told he has age-related findings.  Of note, he has severe obstructive sleep apnea and is currently not using his CPAP consistently.  He feels dry mouth which is bothersome.  He has not had a follow-up with his sleep specialist, he used to see Dr. Claiborne Billings for this.  I was able to review his split-night sleep study from October 2017, it looks like he had severe obstructive sleep apnea with an AHI of 103/h at baseline.  He reports that his CPAP pressure is at 14.  He estimates that he uses his CPAP maybe twice a week.  He reports no significant morning headaches but has sleep disruption from  nocturia which can be once or twice per night or more.  He does not drink caffeine on a day-to-day basis.  He tries to hydrate well with water.  He does use oxygen at night.  He denies any sudden onset of one-sided weakness or numbness or tingling or droopy face or slurring of speech.  Headaches are bilateral, sometimes in the front and sometimes in the back.  I have previously seen him for headaches and balance problems with status post head injury in November 2017.  I reviewed your office note from 01/24/2019.  He has been on tramadol as needed and had a oral prednisone course.  He presented to the emergency room on 01/19/2019 and reported a fall backwards hitting his head on the back about 2-1/2 weeks prior.  I reviewed the emergency room records. He had a head CT without contrast as well as cervical spine CT without contrast on 01/19/2019 and I reviewed the results: IMPRESSION: 1. No acute intracranial abnormality. No skull fracture. 2. No acute fracture or subluxation of the cervical spine. Anterior fusion hardware from C4 through C7. 3. Previous lucencies within the posterior arch of C1 are no longer seen, likely healed fractures.   Of note, he is on multiple medications.  He is on clonazepam at night.  He was recently advised to stop taking Ambien CR.    Previously:   05/19/2016: 74 year old right-handed gentleman with an underlying complex medical history of morbid obesity, Hx of neck surgery and low back surgery, GERD,  OSA on CPAP, HTN, neck injury secondary to fall in November 2017, during which he sustained a C1 fracture, and left rotator cuff injury, who presents for follow-up consultation of his headaches and balance issues with status post head injury in November 2017. The patient is unaccompanied today.   05/19/2016: He reports doing better overall, but has tingling in both arms and hands. This, he feels is progressive. He reports a neck brace for about 12 weeks. After he had his  latest neck CT which we reviewed today he was able to take the neck brace off per Dr. Rolena Infante instructions. He is worried about having problems with his neck again. He had his prior neck surgery under Dr. Saintclair Halsted with great results several years ago, could be 10 years ago. He saw Dr. Gladstone Lighter for LBP. He reports no recurrent headaches or lightheadedness or new balance problems, has been active but develops tingling in both shoulder areas and radiating to both arms and hands with even normal day-to-day activities such as reaching. He has had no falls. Diabetes control is good. He has a routine follow-up with his primary care physician next month.   The patient's allergies, current medications, family history, past medical history, past social history, past surgical history and problem list were reviewed and updated as appropriate.    Previously (copied from previous notes for reference):     I first met him on 02/19/2016 at the request of his neurosurgeon, at which time the patient reported residual headaches and balance issues after he he had sustained a head injury secondary to accidental fall at home which resulted in C1 fracture. I suggested we proceed with a brain MRI and EEG. I also asked patient to refrain from taking any potentially sedating medications and use pain medication sparingly. He had a brain MRI without contrast on 02/24/2016 which showed: IMPRESSION:  Abnormal MRI brain showing mild changes of chronic microvascular ischemia and cortical atrophy. Mild paranasal chronic sinusitis changes. Large disc protusion at C 3-4 with cord compression and post operative changes of anterior cervical fusion at C 4 and below. Recomend MRI C Spine if clinically indicated   We called him with his test results.   He had an EEG on 03/17/2016 which showed: Impression: This is a normal EEG recording in the waking state. No evidence of ictal or interictal discharges are seen.   We called him with his test  results.     02/19/2016: He reports residual headaches and balace issues, post head injury on 12/26/15. He had LOC for some time and was calling out for his wife, but she could not hear him. He had gotten up to use the bathroom around 3 AM. He had not made enough light, she reports, and he did not like to turn on lights at night, but has been using a night light since the accidental fall. He has a cane and a walker from before, from when he had low back surgery.  I reviewed your office note from 02/18/15, which you kindly included. He was treated for his C1 fracture conservatively with a collar. He was admitted on 12/26/2015 after a fall in the bathtub at home. He was discharged on 12/27/2015. He had a head CT without contrast as well as a C-spine CT without contrast on 12/26/2015 which I reviewed: IMPRESSION: CT BRAIN:   1. No acute intracranial process. 2. Small scalp contusion at the right frontal vertex. 3. Mild age-related cerebral atrophy with chronic small vessel ischemic disease.  CT CERVICAL SPINE:   1. Linear lucencies traversing the posterior ring of C1 bilaterally. While these are favored to be chronic in nature, these are somewhat age indeterminate, with possible acute fractures not entirely excluded. Further evaluation with MRI is recommended to assess the chronicity of this finding. 2. No other acute traumatic injury within the cervical spine. 3. Status post ACDF at C4 through C7 without complication. He had a cervical spine MRI without contrast on 12/26/2015 which I reviewed: IMPRESSION: 1. A definite C1 fracture is not identified on the MRI but I believe the findings on the CT scan are real and there is abnormal prevertebral fluid and hemorrhage which would suggest a fracture. 2. Central disc protrusion at C3-4 with mild mass effect on the ventral thecal sac and mild bilateral foraminal encroachment. 3. Solid fusion changes from N2-D7 without complicating features. 4.  Normal appearance of the cervical spinal cord. He had a repeat CT cervical spine without contrast on 02/06/2016 which I reviewed: IMPRESSION: 1. Linear lucency through the inferior margin of the right and left posterior arches of C1 which are unchanged compared with 12/26/2015 and may reflect incomplete ununited nondisplaced fractures versus vascular foramina. No significant interval change compared with 12/26/2015. 2. Anterior cervical disc fusion from C4 through C7.   As I understand, he will be using his hard neck collar for at least 6 more weeks. He has not been driving and has been advised not to drive secondary to his neck fracture and using a hard collar. He has not had any episodes of confusion or convulsions. He does not have any one-sided weakness or numbness. He does have some tingling in the right shoulder area and proximal right arm area. When he fell he initially was not able to move either arm or leg he remembers. He has a history of obstructive sleep apnea and has been trying to use his CPAP. He tries to sleep in the recliner for more comfort and ease of use with his CPAP. It is difficult for him to use his CPAP currently. He has intermittent right parietal headaches which are short-lived, sometimes just a few minutes, as long as 5 or 10 minutes at most. They are moderately severe but not long enough for him to take any medication. Of note, he is not on any narcotic pain medication and takes as needed tramadol which he has been taking sparingly. He has not had any visual symptoms or speech impairment, memory seems stable and adequate.  His Past Medical History Is Significant For: Past Medical History:  Diagnosis Date  . Allergy   . Asthma   . CAD (coronary artery disease)   . CHF (congestive heart failure) ([place])   . Diabetes mellitus   . Fibromyalgia   . GERD (gastroesophageal reflux disease)   . GI bleeding   . Gout   . Hyperlipidemia   . Hypertension   . Hypogonadism male    . Insomnia   . MRSA cellulitis   . Neuropathy   . Obesity   . Shortness of breath dyspnea    with exertion   . Sleep apnea    cpap- 14   . Wheezing    no asthma diagnosis    His Past Surgical History Is Significant For: Past Surgical History:  Procedure Laterality Date  . BACK SURGERY    . CARDIAC CATHETERIZATION    . COLONOSCOPY    . LEFT HEART CATH AND CORONARY ANGIOGRAPHY N/A 12/02/2016   Procedure: LEFT HEART CATH  AND CORONARY ANGIOGRAPHY;  Surgeon: Jettie Booze, MD;  Location: [facility];  Service: Cardiovascular;  Laterality: N/A;  . LUMBAR LAMINECTOMY/DECOMPRESSION MICRODISCECTOMY N/A 01/02/2014   Procedure: CENTRAL DECOMPRESSION LUMBAR LAMINECTOMY L3-L4, L4-L5;  Surgeon: Tobi Bastos, MD;  Location: [facility];  Service: Orthopedics;  Laterality: N/A;  . neck fusion      His Family History Is Significant For: Family History  Problem Relation Age of Onset  . Colon cancer Mother   . Diabetes Father        siblings  . Heart disease Father        brother  . Heart attack Father   . Kidney disease Sister   . Heart failure Sister   . Heart disease Brother   . Heart attack Son 70  . Drug abuse Sister   . Heart disease Brother   . Deep vein thrombosis Brother   . Colon polyps Neg Hx     His Social History Is Significant For: Social History   Socioeconomic History  . Marital status: Married    Spouse name: Butch Penny  . Number of children: 3  . Years of education: 8  . Highest education level: GED or equivalent  Occupational History  . 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 Smoking status: Former Smoker    Quit date: 02/08/1969    Years since quitting: 50.1  . Smokeless tobacco: Former Systems developer    Quit date: 1971  Substance and Sexual Activity  . Alcohol use: No    Alcohol/week: 0.0 standard drinks  . Drug use: No  . Sexual activity: Not Currently  Other Topics Concern  . Not on file    Social History Narrative   Drinks caffeine tea occasionally    Social Determinants of Health   Financial Resource Strain: Low Risk   . Difficulty of Paying Living Expenses: Not hard at all  Food Insecurity: No Food Insecurity  . Worried About Charity fundraiser in the Last Year: Never true  . Ran Out of Food in the Last Year: Never true  Transportation Needs: No Transportation Needs  . Lack of Transportation (Medical): No  . Lack of Transportation (Non-Medical): No  Physical Activity: Inactive  . Days of Exercise per Week: 0 days  . Minutes of Exercise per Session: 0 min  Stress: No Stress Concern Present  . Feeling of Stress : Only a little  Social Connections: Not Isolated  . Frequency of Communication with Friends and Family: More than three times a week  . Frequency of Social Gatherings with Friends and Family: More than three times a week  . Attends Religious Services: More than 4 times per year  . Active Member of Clubs or Organizations: Yes  . Attends Archivist Meetings: More than 4 times per year  . Marital Status: Married    His Allergies Are:  Allergies  Allergen Reactions  . Amlodipine Besy-Benazepril Hcl Swelling and Other (See Comments)    Makes tongue swell (lotrel)  . Oxycodone Itching  . Phenergan [Promethazine Hcl] Other (See Comments)    "I can't remember."  . Hydrocodone Itching    Can tolerate in low doses  :   His Current Medications Are:  Outpatient Encounter Medications as of 03/14/2019  Medication Sig  . albuterol (PROVENTIL HFA;VENTOLIN HFA) 108 (90 Base) MCG/ACT inhaler Inhale 2 puffs into the lungs every 6 (six) hours as needed for wheezing  or shortness of breath.  [name] atorvastatin (LIPITOR) 40 MG tablet Take 1 tablet (40 mg total) by mouth daily.  . bisoprolol (ZEBETA) 5 MG tablet Take 0.5 tablets (2.5 mg total) by mouth daily.  . Blood Glucose Monitoring Suppl (ACCU-CHEK AVIVA) device Use as instructed  . Blood Pressure Monitoring  (BLOOD PRESSURE CUFF) MISC 1 each by Does not apply route daily.  . budesonide-formoterol (SYMBICORT) 160-4.5 MCG/ACT inhaler Inhale 2 puffs into the lungs 2 (two) times daily.  . Cholecalciferol (VITAMIN D PO) Take 5,000 Units by mouth every morning.   . clonazePAM (KLONOPIN) 0.5 MG tablet Take 1 tablet (0.5 mg total) by mouth at bedtime as needed.  . furosemide (LASIX) 40 MG tablet Take 2 tablets by mouth twice daily  . glimepiride (AMARYL) 4 MG tablet Take 1 tablet (4 mg total) by mouth 3 (three) times daily.  [name] glucose blood (ACCU-CHEK AVIVA) test strip 1 each by Other route daily. Use as instructed  . hydrALAZINE (APRESOLINE) 10 MG tablet Take 1 tablet (10 mg total) by mouth 3 (three) times daily.  . hydroxychloroquine (PLAQUENIL) 200 MG tablet Take 2 tablets (400 mg total) by mouth daily.  . isosorbide dinitrate (ISORDIL) 30 MG tablet Take 1 tablet (30 mg total) by mouth 3 (three) times daily.  . Lancets (ACCU-CHEK SOFT TOUCH) lancets 1 each by Other route daily. Use as instructed  . lisinopril (ZESTRIL) 20 MG tablet Take 1 tablet (20 mg total) by mouth daily.  . Misc. Devices (PULSE OXIMETER) MISC 1 each by Does not apply route continuous as needed.  . mometasone-formoterol (DULERA) 100-5 MCG/ACT AERO Inhale 2 puffs into the lungs 2 (two) times daily.  [name] DELICA LANCETS 83M MISC 1 each by Does not apply route daily. Test 1X per day and prn  . OXYGEN Inhale 2 L into the lungs at bedtime. 2lpm with sleep   . Potassium Chloride ER 20 MEQ TBCR Take 1 tablet by mouth daily.  [name] PRODIGY NO CODING BLOOD GLUC test strip CHECK BLOOD SUGARS ONCE DAILY  . rizatriptan (MAXALT) 5 MG tablet Take 1 tablet (5 mg total) by mouth as needed for migraine. May repeat in 2 hours if needed  . sertraline (ZOLOFT) 50 MG tablet Take 1 tablet (50 mg total) by mouth daily.  . traMADol (ULTRAM) 50 MG tablet Take 1 tablet (50 mg total) by mouth every 6 (six) hours as needed.   No facility-administered  encounter medications on file as of 03/14/2019.  :  Review of Systems:  Out of a complete 14 point review of systems, all are reviewed and negative with the exception of these symptoms as listed below:   Review of Systems  Constitutional: Negative.   HENT: Negative.   Eyes:       Floaters in both eyes  Respiratory: Positive for shortness of breath and wheezing.        Patient has copd and bronchitis   Gastrointestinal: Negative.   Endocrine: Negative.   Genitourinary: Negative.   Musculoskeletal: Positive for back pain.  Neurological: Positive for dizziness, light-headedness and headaches.  Psychiatric/Behavioral: Positive for sleep disturbance.       Objective:   Physical Exam Physical Examination:   Vitals:   03/14/19 1450  BP: (!) 142/84  Pulse: 68  Temp: (!) 97.3 F (36.3 C)   General Examination: The patient is a very pleasant 74 y.o. male in no acute distress. He appears well-developed and well-nourished and well groomed.   HEENT: Normocephalic, atraumatic, pupils  are Slightly unequal, with left pupil slightly smaller than right, both reactive, could be normal for him.  He has corrective eyeglasses, mild bilateral cataracts noted and funduscopic exam is unremarkable.  Face is symmetric with normal facial animation and normal hearing noted.  No carotid bruits, neck mobility is limited. Oropharynx exam reveals: mild mouth dryness, adequate dental hygiene and moderate airway crowding. Tongue protrudes centrally and palate elevates symmetrically.  Chest: Clear to auscultation without wheezing, rhonchi or crackles noted.  Heart: S1+S2+0, regular and normal without murmurs, rubs or gallops noted.   Abdomen: Soft, non-tender and non-distended with normal bowel sounds appreciated on auscultation.  Extremities: There is no pitting edema in the distal lower extremities bilaterally.   Skin: Warm and dry without trophic changes noted.  Musculoskeletal: exam reveals Low back  pain.   Neurologically:  Mental status: The patient is awake, alert and oriented in all 4 spheres. His immediate and remote memory, attention, language skills and fund of knowledge are appropriate. There is no evidence of aphasia, agnosia, apraxia or anomia. Speech is clear with normal prosody and enunciation. Thought process is linear. Mood is normal and affect is normal.  Cranial nerves II - XII are as described above under HEENT exam. In addition: shoulder shrug is normal with equal shoulder height noted. Motor exam: Normal bulk, strength and tone is noted. There is no drift, tremor or rebound. Romberg is negative. Reflexes are 1+ throughout. Babinski: Toes are flexor bilaterally. Fine motor skills and coordination: intact with normal finger taps, normal hand movements, normal rapid alternating patting, normal foot taps and normal foot agility.  Cerebellar testing: No dysmetria or intention tremor on finger to nose testing. There is no truncal or gait ataxia.  Sensory exam: intact to light touch in the upper and lower extremities.  Gait, station and balance: He stands easily. No veering to one side is noted. No leaning to one side is noted. Posture is age-appropriate and stance is narrow based. Gait shows normal stride length and normal pace. No problems turning are noted.     Assessment & Plan:  Assessment and Plan:  In summary, ANQUAN AZZARELLO is a very pleasant 74 y.o.-year old male with an underlying complex medical history of hypertension, hyperlipidemia, reflux disease, diabetes, CHF, coronary artery disease, sleep apnea, asthma, allergies, history of neuropathy, history of MRSA cellulitis, hypogonadism, low back pain, history of C1 fracture, status post neck surgery, rotator cuff surgery, prior head injury and fall with injuries and morbid obesity with a BMI of over 25, who Presents for evaluation of his recurrent headaches.  He has had falls with injuries before, fall with head injury in  November 2017 as well.  He reports nearly daily headaches, he does not have a history of migraines.  His headaches may be secondary to multiple issues, he had a recent fall and bumped his head on the back.  CT Of the head and cervical spine showed no acute injuries thankfully.  He has had floaters.  He had an optometrist appointment, he is encouraged to seek evaluation with a ophthalmologist as well.  On my examination, there are no focal findings thankfully; He does have a slight discrepancy in pupillary size, left pupil slightly smaller than right, both are reactive and no abnormalities were seen on funduscopic exam.  He could have a normal or physiological discrepancy in pupillary size. Nevertheless, he is encouraged to seek consultation with an ophthalmologist. He had some success with Maxalt.  I Recommended daily preventative treatment  with amitriptyline.  He is encouraged to start on a low-dose and titrate up.  He is reminded to stay well-hydrated.  He is currently untreated or suboptimally treated for his severe obstructive sleep apnea.  He indicates that he uses his CPAP may be twice a week.  He has not had a recheck with his sleep specialist.  He is advised to make a follow-up appointment as soon as possible.  Maybe he needs adjustment in his mask or his settings.  He is advised that untreated severe obstructive sleep apnea can cause problems with recurrent headaches.  In part, his recurrent headaches may very well be related to untreated sleep apnea.In addition, he has significant heart disease including congestive heart failure and coronary artery disease, he is strongly reminded to get back on track with his CPAP therapy. I suggested we proceed with a brain MRI with and without contrast to rule out a structural cause of his recurrent headaches.  He was given written instructions and a new prescription for amitriptyline.  I suggested follow-up routinely with the nurse practitioner in our office in 3  months, sooner if needed.  I answered all his questions today and he was in agreement. Thank you very much for allowing me to participate in the care of this nice patient. If I can be of any further assistance to you please do not hesitate to call me at [phone].  Sincerely,   Star Age, MD, PhD

## 2019-03-15 ENCOUNTER — Ambulatory Visit (INDEPENDENT_AMBULATORY_CARE_PROVIDER_SITE_OTHER): Payer: PPO | Admitting: *Deleted

## 2019-03-15 ENCOUNTER — Telehealth: Payer: Self-pay | Admitting: Neurology

## 2019-03-15 DIAGNOSIS — J449 Chronic obstructive pulmonary disease, unspecified: Secondary | ICD-10-CM

## 2019-03-15 NOTE — Chronic Care Management (AMB) (Signed)
Chronic Care Management   Follow Up Note   03/15/2019 Name: Kenneth Fuller MRN: [id number] DOB: 1945/04/16  Referred by: [name], [name], MD Reason for referral : Chronic Care Management (RN follow up)   Kenneth Fuller is a 74 y.o. year old male who is a primary care patient of [name], [name], MD. The CCM team was consulted for assistance with chronic disease management and care coordination needs.    Review of patient status, including review of consultants reports, relevant laboratory and other test results, and collaboration with appropriate care team members and the patient's provider was performed as part of comprehensive patient evaluation and provision of chronic care management services.    Spoke to patient by telephone today regarding prescription assistance and COPD management.   Outpatient Encounter Medications as of 03/15/2019  Medication Sig  . albuterol (ACCUNEB) 1.25 MG/3ML nebulizer solution Take 1 ampule by nebulization every 6 (six) hours as needed for wheezing.  [name] albuterol (PROVENTIL HFA;VENTOLIN HFA) 108 (90 Base) MCG/ACT inhaler Inhale 2 puffs into the lungs every 6 (six) hours as needed for wheezing or shortness of breath. (Patient not taking: Reported on 03/15/2019)  . amitriptyline (ELAVIL) 25 MG tablet 1/2 pill each bedtime x 1 week, then 1 pill nightly x 1 week, then 1 1/2 pills nightly x 1 week, then 2 pills nightly thereafter.  [name] atorvastatin (LIPITOR) 40 MG tablet Take 1 tablet (40 mg total) by mouth daily.  . bisoprolol (ZEBETA) 5 MG tablet Take 0.5 tablets (2.5 mg total) by mouth daily.  . Blood Glucose Monitoring Suppl (ACCU-CHEK AVIVA) device Use as instructed  . Blood Pressure Monitoring (BLOOD PRESSURE CUFF) MISC 1 each by Does not apply route daily.  . budesonide-formoterol (SYMBICORT) 160-4.5 MCG/ACT inhaler Inhale 2 puffs into the lungs 2 (two) times daily.  . Cholecalciferol (VITAMIN D PO) Take 5,000 Units by mouth every morning.   .  clonazePAM (KLONOPIN) 0.5 MG tablet Take 1 tablet (0.5 mg total) by mouth at bedtime as needed.  . furosemide (LASIX) 40 MG tablet Take 2 tablets by mouth twice daily  . glimepiride (AMARYL) 4 MG tablet Take 1 tablet (4 mg total) by mouth 3 (three) times daily.  [name] glucose blood (ACCU-CHEK AVIVA) test strip 1 each by Other route daily. Use as instructed  . hydrALAZINE (APRESOLINE) 10 MG tablet Take 1 tablet (10 mg total) by mouth 3 (three) times daily.  . hydroxychloroquine (PLAQUENIL) 200 MG tablet Take 2 tablets (400 mg total) by mouth daily.  . isosorbide dinitrate (ISORDIL) 30 MG tablet Take 1 tablet (30 mg total) by mouth 3 (three) times daily.  . Lancets (ACCU-CHEK SOFT TOUCH) lancets 1 each by Other route daily. Use as instructed  . lisinopril (ZESTRIL) 20 MG tablet Take 1 tablet (20 mg total) by mouth daily.  . Misc. Devices (PULSE OXIMETER) MISC 1 each by Does not apply route continuous as needed.  . mometasone-formoterol (DULERA) 100-5 MCG/ACT AERO Inhale 2 puffs into the lungs 2 (two) times daily.  [name] DELICA LANCETS 79X MISC 1 each by Does not apply route daily. Test 1X per day and prn  . OXYGEN Inhale 2 L into the lungs at bedtime. 2lpm with sleep   . Potassium Chloride ER 20 MEQ TBCR Take 1 tablet by mouth daily.  [name] PRODIGY NO CODING BLOOD GLUC test strip CHECK BLOOD SUGARS ONCE DAILY  . rizatriptan (MAXALT) 5 MG tablet Take 1 tablet (5 mg total) by mouth as needed for migraine.  May repeat in 2 hours if needed  . sertraline (ZOLOFT) 50 MG tablet Take 1 tablet (50 mg total) by mouth daily.  . traMADol (ULTRAM) 50 MG tablet Take 1 tablet (50 mg total) by mouth every 6 (six) hours as needed.   No facility-administered encounter medications on file as of 03/15/2019.       RN Care Plan   . COPD Symptom Management        Current Barriers:  . Chronic Disease Management support and education needs related to COPD & shortness of breath  Nurse Case Manager Clinical Goal(s):   [name] Over the next 90 days, patient will continue to demonstrate self management of COPD as evidenced by no acute exacerbations   Interventions:  . Discussed HPI with patient . Chart reviewed . Reviewed and discussed medications o Albuterol inhaler was switched to nebulizer solution by [facility]. Continuing to use maintenance inhalers . Advised patient to reach out to PCP or RN CCM with any COPD needs  Patient Self Care Activities:  . Performs ADL's independently . Performs IADL's independently . Unable to independently N/A  Please see past updates related to this goal by clicking on the "Past Updates" button in the selected goal      . Prescription Assistance (pt-stated)       Prescription assistance needs in a patient with diabetes, asthma, HTN, and OSA.  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   Nurse Case Manager Clinical Goal(s):  [name] Over the next year, patient will reach out to RN CCM if he needs prescription assistance.   Interventions:  . Talked with patient about prescription assistance through [employer]. Reports that he has not heard from the appeal. . Discussed current treatment and patient stated that [place] came out for an evaluation last week and was able to change his albuterol to a nebulizer solution and that he no longer needs [employer] Rx assistance.   Patient Self Care Activities:  . Performs ADL's independently . Performs IADL's independently . Unable to independently n/a  Please see past updates related to this goal by clicking on the "Past Updates" button in the selected goal   Medications were changed. No longer needs Rx assistance at this time. Patient will reach out if that changes.          Follow-up Plan:   The care management team will reach out to the patient again over the next 60 days.    [name], BSN, RN-BC Embedded Chronic Care Manager [facility] [place] [facility] Direct Dial: [phone]

## 2019-03-15 NOTE — Patient Instructions (Signed)
Visit Information  Goals Addressed            This Visit's Progress     Patient Stated   . COPD Symptom Management (pt-stated)       Current Barriers:  . Chronic Disease Management support and education needs related to COPD & shortness of breath  Nurse Case Manager Clinical Goal(s):  [name] Over the next 90 days, patient will continue to demonstrate self management of COPD as evidenced by no acute exacerbations   Interventions:  . Discussed HPI with patient . Chart reviewed . Reviewed and discussed medications o Albuterol inhaler was switched to nebulizer solution by [facility]. Continuing to use maintenance inhalers . Advised patient to reach out to PCP or RN CCM with any COPD needs  Patient Self Care Activities:  . Performs ADL's independently . Performs IADL's independently . Unable to independently N/A  Please see past updates related to this goal by clicking on the "Past Updates" button in the selected goal      . Prescription Assistance (pt-stated)       Prescription assistance needs in a patient with diabetes, asthma, HTN, and OSA.  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   Nurse Case Manager Clinical Goal(s):  [name] Over the next year, patient will reach out to RN CCM if he needs prescription assistance.   Interventions:  . Talked with patient about prescription assistance through [employer]. Reports that he has not heard from the appeal. . Discussed current treatment and patient stated that [place] came out for an evaluation last week and was able to change his albuterol to a nebulizer solution and that he no longer needs [employer] Rx assistance.   Patient Self Care Activities:  . Performs ADL's independently . Performs IADL's independently . Unable to independently n/a  Please see past updates related to this goal by clicking on the "Past Updates" button in the selected goal   Medications were  changed. No longer needs Rx assistance at this time. Patient will reach out if that changes.        The care management team will reach out to the patient again over the next 60 days.   [name], BSN, RN-BC Embedded Chronic Care Manager [facility] [place] [facility] Direct Dial: [phone]   The patient verbalized understanding of instructions provided today and declined a print copy of patient instruction materials.

## 2019-03-15 NOTE — Telephone Encounter (Signed)
Health team order sent to GI. No auth they will reach out to the patient to schedule. °

## 2019-03-20 DIAGNOSIS — R0902 Hypoxemia: Secondary | ICD-10-CM | POA: Diagnosis not present

## 2019-03-20 DIAGNOSIS — R0600 Dyspnea, unspecified: Secondary | ICD-10-CM | POA: Diagnosis not present

## 2019-03-22 ENCOUNTER — Ambulatory Visit: Payer: PPO

## 2019-03-26 ENCOUNTER — Ambulatory Visit: Payer: PPO | Admitting: *Deleted

## 2019-03-26 ENCOUNTER — Ambulatory Visit: Payer: Self-pay | Admitting: Licensed Clinical Social Worker

## 2019-03-26 DIAGNOSIS — J449 Chronic obstructive pulmonary disease, unspecified: Secondary | ICD-10-CM | POA: Diagnosis not present

## 2019-03-26 DIAGNOSIS — I5042 Chronic combined systolic (congestive) and diastolic (congestive) heart failure: Secondary | ICD-10-CM

## 2019-03-26 DIAGNOSIS — F339 Major depressive disorder, recurrent, unspecified: Secondary | ICD-10-CM | POA: Diagnosis not present

## 2019-03-26 DIAGNOSIS — I2510 Atherosclerotic heart disease of native coronary artery without angina pectoris: Secondary | ICD-10-CM

## 2019-03-26 DIAGNOSIS — I10 Essential (primary) hypertension: Secondary | ICD-10-CM

## 2019-03-26 DIAGNOSIS — J441 Chronic obstructive pulmonary disease with (acute) exacerbation: Secondary | ICD-10-CM | POA: Diagnosis not present

## 2019-03-26 DIAGNOSIS — E119 Type 2 diabetes mellitus without complications: Secondary | ICD-10-CM

## 2019-03-26 DIAGNOSIS — N1831 Chronic kidney disease, stage 3a: Secondary | ICD-10-CM

## 2019-03-26 NOTE — Patient Instructions (Addendum)
Licensed Clinical Social Worker Visit Information  Goals we discussed today:  Goals        . Client has stress related to ongoing health issues and wishes to talk more about stress over current health issues (pt-stated)     Current Barriers:  [name] Mental Health Concerns  in patient with Chronic Diagnoses of CAD, DM Type II, Depression,recurrent, CKD, COPD and CHF .  Challenges related to managing health issues of client   Clinical Social Work Clinical Goal(s):  [name] Over the next 30 days, client will work with LCSW to address concerns related to health issues of client and client management of health issues faced  Interventions: Talked with client about pain issues of client  Talked with client about breathing issues of client  Talked with client about his current headaches (client is taking pain medication as prescribed)  Talked with client about recent eye exam of client  Talked with client about his upcoming MRI on 04/07/19  Talked with client about his current needs  Talked with client about his upcoming medical appointments  Collaborated with [facility] regarding nursing needs of client  Patient Self Care Activities:  . Self administers medications as prescribed . Attends all scheduled provider appointments . Performs ADL's independently   Plan:  LCSW to call client in next 4 weeks to talk with client about stress issues related to his management of health issues faced Client to communicate with RNCM to discuss nursing needs of client Client to attend scheduled client medical appointments   Initial goal documentation          Materials Provided: No  Follow Up Plan:  LCSW to call client in next 4 weeks to talk with client about stress issues related to managing health issues faced  The patient verbalized understanding of instructions provided today and declined a print copy of patient instruction materials.   [name].Shaya Reddick MSW, LCSW Licensed Clinical Social Worker [facility] Care Management [phone]

## 2019-03-26 NOTE — Chronic Care Management (AMB) (Signed)
Care Management Note   Kenneth Fuller is a 74 y.o. year old male who is a primary care patient of [name], [name], MD. The CM team was consulted for assistance with chronic disease management and care coordination.   I reached out to Kenneth Fuller by phone today.   Review of patient status, including review of consultants reports, relevant laboratory and other test results, and collaboration with appropriate care team members and the patient's provider was performed as part of comprehensive patient evaluation and provision of chronic care management services.   Social determinants of health:risk of tobacco use;risk of physical inactivity    Office Visit from 12/14/2017 in [facility]  PHQ-9 Total Score  12     GAD 7 : Generalized Anxiety Score 11/11/2017  Nervous, Anxious, on Edge 3  Control/stop worrying 3  Worry too much - different things 3  Trouble relaxing 3  Restless 3  Easily annoyed or irritable 3  Afraid - awful might happen 0  Total GAD 7 Score 18  Anxiety Difficulty Somewhat difficult   Medications   albuterol (ACCUNEB) 1.25 MG/3ML nebulizer solution albuterol (PROVENTIL HFA;VENTOLIN HFA) 108 (90 Base) MCG/ACT inhaler amitriptyline (ELAVIL) 25 MG tablet atorvastatin (LIPITOR) 40 MG tablet bisoprolol (ZEBETA) 5 MG tablet Blood Glucose Monitoring Suppl (ACCU-CHEK AVIVA) device Blood Pressure Monitoring (BLOOD PRESSURE CUFF) MISC budesonide-formoterol (SYMBICORT) 160-4.5 MCG/ACT inhaler Cholecalciferol (VITAMIN D PO) clonazePAM (KLONOPIN) 0.5 MG tablet furosemide (LASIX) 40 MG tablet glimepiride (AMARYL) 4 MG tablet glucose blood (ACCU-CHEK AVIVA) test strip hydrALAZINE (APRESOLINE) 10 MG tablet hydroxychloroquine (PLAQUENIL) 200 MG tablet isosorbide dinitrate (ISORDIL) 30 MG tablet Lancets (ACCU-CHEK SOFT TOUCH) lancets lisinopril (ZESTRIL) 20 MG tablet Misc. Devices (PULSE OXIMETER) MISC mometasone-formoterol (DULERA) 100-5  MCG/ACT AERO ONETOUCH DELICA LANCETS 81X MISC OXYGEN Potassium Chloride ER 20 MEQ TBCR PRODIGY NO CODING BLOOD GLUC test strip rizatriptan (MAXALT) 5 MG tablet sertraline (ZOLOFT) 50 MG tablet traMADol (ULTRAM) 50 MG tablet  GOAL:      . Client derstands the importance of follow-up with providers by attending scheduled visits     Current Barriers:  [name] Mental Health Concerns  in patient with Chronic Diagnoses of CAD, DM Type II, Depression,recurrent, CKD, COPD and CHF .  Challenges related to managing health issues of client   Clinical Social Work Clinical Goal(s):  [name] Over the next 30 days, client will work with LCSW to address concerns related to health issues of client and client management of health issues faced  Interventions:  Talked with client about pain issues of client   Talked with client about breathing issues of client  Talked with client about his current headaches (client is taking pain medication as prescribed)  Talked with client about recent eye exam of client  Talked with client about his upcoming MRI on 04/07/19  Talked with client about his current needs  Talked with client about his upcoming medical appointments  Collaborated with Kenneth Fuller regarding nursing needs of client  Patient Self Care Activities:  . Self administers medications as prescribed . Attends all scheduled provider appointments . Performs ADL's independently   Plan:  LCSW to call client in next 4 weeks to talk with client about stress issues related to his management of health issues faced Client to communicate with RNCM to discuss nursing needs of client Client to attend scheduled client medical appointments   Initial goal documentation          Follow Up Plan: LCSW to call client in next 4  weeks to talk with client about stress issues related to management of health issues faced  Kenneth Fuller.Kenneth Fuller MSW, LCSW Licensed Clinical Social Worker [name]  Care Management [phone]

## 2019-03-26 NOTE — Patient Instructions (Signed)
Visit Information  Goals Addressed            This Visit's Progress     Patient Stated   . Hypertension Management (pt-stated)       Current Barriers:  [name] Knowledge Deficits related to acute on chronic elevation of blood pressure . Chronic Disease Management support and education needs related to Hypertension  Nurse Case Manager Clinical Goal(s):  [name] Over the next 14 days, patient will talk with RN CCM regarding blood pressure management . Over the next 30 days, patient will take all medications as prescribed . Over the next 90 days, patient will keep all scheduled medical appointments  Interventions:  . Consulted by LCSW who spoke with patient by telephone today o Patient reported elevated blood pressure reading of 169/109 this morning . Chart reviewed . Medications reviewed and discussed with patient o Lisinopril was increased to 34m daily at last cardiology visit . Appointments reviewed: Cardiology 04/17/2019 . Asked patient to check blood pressure again and it was 164/84 which is more consistent with previous readings . Encouraged patient to check and record blood pressure twice daily . Advised patient to reach out to cardiology office if readings are outside of recommended range . Patient has been provided with RN CCM contact information and advised to reach out as needed  Patient Self Care Activities:  . Performs ADL's independently . Performs IADL's independently  Please see past updates related to this goal by clicking on the "Past Updates" button in the selected goal        The care management team will reach out to the patient again over the next 30 days.    KChong Sicilian BSN, RN-BC Embedded Chronic Care Manager [facility] Direct Dial: [phone]  The patient verbalized understanding of instructions provided today and declined a print copy of patient instruction materials.

## 2019-03-26 NOTE — Chronic Care Management (AMB) (Signed)
Chronic Care Management   Follow Up Note   03/26/2019 Name: Kenneth Fuller MRN: [id number] DOB: Jun 27, 1945  Referred by: [name], [name], MD Reason for referral : Chronic Care Management (Care Coordination)   Kenneth Fuller is a 74 y.o. year old male who is a primary care patient of [name], [name], MD. The CCM team was consulted for assistance with chronic disease management and care coordination needs.    Review of patient status, including review of consultants reports, relevant laboratory and other test results, and collaboration with appropriate care team members and the patient's provider was performed as part of comprehensive patient evaluation and provision of chronic care management services.    Subjective: I spoke with Kenneth Fuller by telephone today regarding an elevated blood pressure reading after being consulted by Theadore Nan, LCSW.  Objective:  Outpatient Encounter Medications as of 03/26/2019  Medication Sig  . albuterol (ACCUNEB) 1.25 MG/3ML nebulizer solution Take 1 ampule by nebulization every 6 (six) hours as needed for wheezing.  [name] albuterol (PROVENTIL HFA;VENTOLIN HFA) 108 (90 Base) MCG/ACT inhaler Inhale 2 puffs into the lungs every 6 (six) hours as needed for wheezing or shortness of breath. (Patient not taking: Reported on 03/15/2019)  . amitriptyline (ELAVIL) 25 MG tablet 1/2 pill each bedtime x 1 week, then 1 pill nightly x 1 week, then 1 1/2 pills nightly x 1 week, then 2 pills nightly thereafter.  [name] atorvastatin (LIPITOR) 40 MG tablet Take 1 tablet (40 mg total) by mouth daily.  . bisoprolol (ZEBETA) 5 MG tablet Take 0.5 tablets (2.5 mg total) by mouth daily.  . Blood Glucose Monitoring Suppl (ACCU-CHEK AVIVA) device Use as instructed  . Blood Pressure Monitoring (BLOOD PRESSURE CUFF) MISC 1 each by Does not apply route daily.  . budesonide-formoterol (SYMBICORT) 160-4.5 MCG/ACT inhaler Inhale 2 puffs into the lungs 2 (two) times daily.  .  Cholecalciferol (VITAMIN D PO) Take 5,000 Units by mouth every morning.   . clonazePAM (KLONOPIN) 0.5 MG tablet Take 1 tablet (0.5 mg total) by mouth at bedtime as needed.  . furosemide (LASIX) 40 MG tablet Take 2 tablets by mouth twice daily  . glimepiride (AMARYL) 4 MG tablet Take 1 tablet (4 mg total) by mouth 3 (three) times daily.  [name] glucose blood (ACCU-CHEK AVIVA) test strip 1 each by Other route daily. Use as instructed  . hydrALAZINE (APRESOLINE) 10 MG tablet Take 1 tablet (10 mg total) by mouth 3 (three) times daily.  . hydroxychloroquine (PLAQUENIL) 200 MG tablet Take 2 tablets (400 mg total) by mouth daily.  . isosorbide dinitrate (ISORDIL) 30 MG tablet Take 1 tablet (30 mg total) by mouth 3 (three) times daily.  . Lancets (ACCU-CHEK SOFT TOUCH) lancets 1 each by Other route daily. Use as instructed  . lisinopril (ZESTRIL) 20 MG tablet Take 1 tablet (20 mg total) by mouth daily.  . Misc. Devices (PULSE OXIMETER) MISC 1 each by Does not apply route continuous as needed.  . mometasone-formoterol (DULERA) 100-5 MCG/ACT AERO Inhale 2 puffs into the lungs 2 (two) times daily.  [name] DELICA LANCETS 93G MISC 1 each by Does not apply route daily. Test 1X per day and prn  . OXYGEN Inhale 2 L into the lungs at bedtime. 2lpm with sleep   . Potassium Chloride ER 20 MEQ TBCR Take 1 tablet by mouth daily.  [name] PRODIGY NO CODING BLOOD GLUC test strip CHECK BLOOD SUGARS ONCE DAILY  . rizatriptan (MAXALT) 5 MG tablet Take 1  tablet (5 mg total) by mouth as needed for migraine. May repeat in 2 hours if needed  . sertraline (ZOLOFT) 50 MG tablet Take 1 tablet (50 mg total) by mouth daily.  . traMADol (ULTRAM) 50 MG tablet Take 1 tablet (50 mg total) by mouth every 6 (six) hours as needed.   No facility-administered encounter medications on file as of 03/26/2019.     BP Readings from Last 3 Encounters:  03/14/19 (!) 142/84  03/09/19 (!) 186/94  02/15/19 (!) 151/76    RN Care Plan   .  Hypertension Management       Current Barriers:  [name] Knowledge Deficits related to acute on chronic elevation of blood pressure . Chronic Disease Management support and education needs related to Hypertension  Nurse Case Manager Clinical Goal(s):  [name] Over the next 14 days, patient will talk with RN CCM regarding blood pressure management . Over the next 30 days, patient will take all medications as prescribed . Over the next 90 days, patient will keep all scheduled medical appointments  Interventions:  . Consulted by LCSW who spoke with patient by telephone today o Patient reported elevated blood pressure reading of 169/109 this morning . Chart reviewed . Medications reviewed and discussed with patient o Lisinopril was increased to 70m daily at last cardiology visit . Appointments reviewed: Cardiology 04/17/2019 . Asked patient to check blood pressure again and it was 164/84 which is more consistent with previous readings . Encouraged patient to check and record blood pressure twice daily . Advised patient to reach out to cardiology office if readings are outside of recommended range . Patient has been provided with RN CCM contact information and advised to reach out as needed  Patient Self Care Activities:  . Performs ADL's independently . Performs IADL's independently  Please see past updates related to this goal by clicking on the "Past Updates" button in the selected goal          Plan:   The care management team will reach out to the patient again over the next 30 days.   KChong Sicilian BSN, RN-BC Embedded Chronic Care Manager [facility] Direct Dial: [phone]

## 2019-04-03 ENCOUNTER — Telehealth: Payer: Self-pay

## 2019-04-03 ENCOUNTER — Other Ambulatory Visit: Payer: Self-pay

## 2019-04-03 ENCOUNTER — Telehealth: Payer: Self-pay | Admitting: Cardiovascular Disease

## 2019-04-03 ENCOUNTER — Encounter (HOSPITAL_COMMUNITY): Payer: Self-pay | Admitting: Emergency Medicine

## 2019-04-03 ENCOUNTER — Emergency Department (HOSPITAL_COMMUNITY): Payer: PPO

## 2019-04-03 ENCOUNTER — Emergency Department (HOSPITAL_COMMUNITY)
Admission: EM | Admit: 2019-04-03 | Discharge: 2019-04-04 | Disposition: A | Discharge status: Home / Self Care | Payer: PPO | Attending: Emergency Medicine | Admitting: Emergency Medicine

## 2019-04-03 DIAGNOSIS — R030 Elevated blood-pressure reading, without diagnosis of hypertension: Secondary | ICD-10-CM | POA: Insufficient documentation

## 2019-04-03 DIAGNOSIS — I5032 Chronic diastolic (congestive) heart failure: Secondary | ICD-10-CM | POA: Insufficient documentation

## 2019-04-03 DIAGNOSIS — I10 Essential (primary) hypertension: Secondary | ICD-10-CM | POA: Diagnosis not present

## 2019-04-03 DIAGNOSIS — E1140 Type 2 diabetes mellitus with diabetic neuropathy, unspecified: Secondary | ICD-10-CM | POA: Diagnosis not present

## 2019-04-03 DIAGNOSIS — R0602 Shortness of breath: Secondary | ICD-10-CM | POA: Insufficient documentation

## 2019-04-03 DIAGNOSIS — R110 Nausea: Secondary | ICD-10-CM | POA: Diagnosis not present

## 2019-04-03 DIAGNOSIS — Z87891 Personal history of nicotine dependence: Secondary | ICD-10-CM | POA: Insufficient documentation

## 2019-04-03 DIAGNOSIS — R519 Headache, unspecified: Secondary | ICD-10-CM | POA: Insufficient documentation

## 2019-04-03 DIAGNOSIS — Z79899 Other long term (current) drug therapy: Secondary | ICD-10-CM | POA: Diagnosis not present

## 2019-04-03 DIAGNOSIS — N183 Chronic kidney disease, stage 3 unspecified: Secondary | ICD-10-CM | POA: Diagnosis not present

## 2019-04-03 DIAGNOSIS — R0789 Other chest pain: Secondary | ICD-10-CM | POA: Diagnosis not present

## 2019-04-03 DIAGNOSIS — E1122 Type 2 diabetes mellitus with diabetic chronic kidney disease: Secondary | ICD-10-CM | POA: Diagnosis not present

## 2019-04-03 DIAGNOSIS — H53149 Visual discomfort, unspecified: Secondary | ICD-10-CM | POA: Diagnosis not present

## 2019-04-03 DIAGNOSIS — I130 Hypertensive heart and chronic kidney disease with heart failure and stage 1 through stage 4 chronic kidney disease, or unspecified chronic kidney disease: Secondary | ICD-10-CM | POA: Diagnosis present

## 2019-04-03 DIAGNOSIS — R05 Cough: Secondary | ICD-10-CM | POA: Diagnosis not present

## 2019-04-03 LAB — CBC
HCT: 40.9 % (ref 39.0–52.0)
Hemoglobin: 12.7 g/dL — ABNORMAL LOW (ref 13.0–17.0)
MCH: 25.9 pg — ABNORMAL LOW (ref 26.0–34.0)
MCHC: 31.1 g/dL (ref 30.0–36.0)
MCV: 83.3 fL (ref 80.0–100.0)
Platelets: 262 10*3/uL (ref 150–400)
RBC: 4.91 MIL/uL (ref 4.22–5.81)
RDW: 14.7 % (ref 11.5–15.5)
WBC: 10 10*3/uL (ref 4.0–10.5)
nRBC: 0 % (ref 0.0–0.2)

## 2019-04-03 LAB — BASIC METABOLIC PANEL
Anion gap: 10 (ref 5–15)
BUN: 19 mg/dL (ref 8–23)
CO2: 27 mmol/L (ref 22–32)
Calcium: 9.4 mg/dL (ref 8.9–10.3)
Chloride: 101 mmol/L (ref 98–111)
Creatinine, Ser: 1.22 mg/dL (ref 0.61–1.24)
GFR calc Af Amer: >60 mL/min (ref >60)
GFR calc non Af Amer: 59 mL/min — ABNORMAL LOW (ref >60)
Glucose, Bld: 176 mg/dL — ABNORMAL HIGH (ref 70–99)
Potassium: 4.1 mmol/L (ref 3.5–5.1)
Sodium: 138 mmol/L (ref 135–145)

## 2019-04-03 LAB — TROPONIN I (HIGH SENSITIVITY)
Troponin I (High Sensitivity): 8 ng/L (ref <18)
Troponin I (High Sensitivity): 10 ng/L (ref <18)

## 2019-04-03 LAB — CBG MONITORING, ED: Glucose-Capillary: 96 mg/dL (ref 70–99)

## 2019-04-03 LAB — BRAIN NATRIURETIC PEPTIDE: B Natriuretic Peptide: 174 pg/mL — ABNORMAL HIGH (ref 0.0–100.0)

## 2019-04-03 MED ORDER — ISOSORBIDE DINITRATE 30 MG PO TABS
30.0000 mg | ORAL_TABLET | Freq: Three times a day (TID) | ORAL | Status: DC
  Administered 2019-04-03: 30 mg via ORAL
  Filled 2019-04-03: qty 1

## 2019-04-03 MED ORDER — HYDRALAZINE HCL 10 MG PO TABS
10.0000 mg | ORAL_TABLET | Freq: Three times a day (TID) | ORAL | Status: DC
  Administered 2019-04-03: 10 mg via ORAL
  Filled 2019-04-03: qty 1

## 2019-04-03 MED ORDER — FUROSEMIDE 20 MG PO TABS
40.0000 mg | ORAL_TABLET | Freq: Once | ORAL | Status: AC
  Administered 2019-04-03: 40 mg via ORAL
  Filled 2019-04-03: qty 2

## 2019-04-03 NOTE — Telephone Encounter (Signed)
New message   Pt c/o BP issue: STAT if pt c/o blurred vision, one-sided weakness or slurred speech  1. What are your last 5 BP readings?186/117 202/104 176/112 170/109 167/103  2. Are you having any other symptoms (ex. Dizziness, headache, blurred vision, passed out)? Headache, patient sees floaters, mild chest pain   3. What is your BP issue? B/p is elevated

## 2019-04-03 NOTE — ED Provider Notes (Signed)
[facility] EMERGENCY DEPARTMENT Provider Note   CSN: [id number] Arrival date & time: 04/03/19  [place]     History Chief Complaint  Patient presents with  . Hypertension  . Shortness of Breath  . Chest Pain    Kenneth Fuller is a 74 y.o. male.   74 y/o male with hx of HTN, HLD, DM, dCHF (LVEF 50-55% in 09/2018), CAD, CKD, OSA, asthma, obesity presents to the ED for evaluation of uncontrolled HTN. States he has been having an issues with his blood pressure for ~8.2 months (systolic usually 505-397'Q; diastolic 73'A-193'X). Is slated to go to a hypertensive clinic during the first week of March. Has been fully compliant with his medications. Currently states that he feels generally "weak". He has been having a sharp, pressure-like left sided chest pain which has been constant x 1.5 days. Pain will occasionally radiate to his L arm and jaw. Patient slightly vague on whether this pain is made worse with exertion. He has not taken any medications for his pain.   Further reports an ongoing headache which has been recurrent x 3+ weeks. Gets nausea and photophobia with this; sometimes has floaters in his vision. Has had minimal improvement with Maxalt. Is unsure if his recurrent headaches are related to his blood pressure. Patient scheduled for brain MRI by his Neurologist this coming weekend to further assess. Denies weight gain, fever, leg swelling, syncope, vomiting, diaphoresis, new/worsening orthopnea. Patient is quite noncompliant with his CPAP. Uses this no more than once per week. Also eats out of the home 4-5 times per week and isn't able to regulate his sodium intake well. No reported sick contacts.  Cardiologist - Dr. Sallyanne Kuster    Hypertension Associated symptoms include chest pain and shortness of breath.  Shortness of Breath Associated symptoms: chest pain   Chest Pain Associated symptoms: shortness of breath        Past Medical History:  Diagnosis Date  .  Allergy   . Asthma   . CAD (coronary artery disease)   . CHF (congestive heart failure) ([place])   . Diabetes mellitus   . Fibromyalgia   . GERD (gastroesophageal reflux disease)   . GI bleeding   . Gout   . Hyperlipidemia   . Hypertension   . Hypogonadism male   . Insomnia   . MRSA cellulitis   . Neuropathy   . Obesity   . Shortness of breath dyspnea    with exertion   . Sleep apnea    cpap- 14   . Wheezing    no asthma diagnosis    Patient Active Problem List   Diagnosis Date Noted  . Chest pain 09/23/2018  . Acute on chronic diastolic CHF (congestive heart failure) ([place]) 09/23/2018  . Insomnia   . CHF (congestive heart failure) ([place]) 01/06/2018  . Depression with anxiety 01/04/2018  . CKD (chronic kidney disease), stage III 01/04/2018  . Chronic obstructive pulmonary disease ([place]) 01/04/2018  . Depression, recurrent ([place]) 11/11/2017  . Chronic diastolic heart failure ([place]) 04/13/2017  . Swelling of lower extremity 11/30/2016  . Obesity, Class III, BMI 40-49.9 (morbid obesity) ([place]) 08/21/2015  . Coronary artery disease involving native coronary artery of native heart without angina pectoris 12/05/2014  . Urinary urgency 10/15/2014  . Spinal stenosis, lumbar region, with neurogenic claudication 01/02/2014  . Hyperlipidemia associated with type 2 diabetes mellitus ([place]) 10/25/2013  . COLONIC POLYPS, ADENOMATOUS 02/25/2007  . Diabetes mellitus type II, non insulin dependent ([place]) 02/25/2007  .  Gout 02/25/2007  . Essential hypertension 02/25/2007  . Cough variant asthma 02/25/2007  . OSA on CPAP 02/25/2007  . FATIGUE, CHRONIC 02/25/2007  . HEADACHE, CHRONIC 02/25/2007  . GERD (gastroesophageal reflux disease) 02/25/2007    Past Surgical History:  Procedure Laterality Date  . BACK SURGERY    . CARDIAC CATHETERIZATION    . COLONOSCOPY    . LEFT HEART CATH AND CORONARY ANGIOGRAPHY N/A 12/02/2016   Procedure: LEFT HEART CATH AND CORONARY ANGIOGRAPHY;  Surgeon:  Jettie Booze, MD;  Location: [facility];  Service: Cardiovascular;  Laterality: N/A;  . LUMBAR LAMINECTOMY/DECOMPRESSION MICRODISCECTOMY N/A 01/02/2014   Procedure: CENTRAL DECOMPRESSION LUMBAR LAMINECTOMY L3-L4, L4-L5;  Surgeon: Tobi Bastos, MD;  Location: [facility];  Service: Orthopedics;  Laterality: N/A;  . neck fusion         Family History  Problem Relation Age of Onset  . Colon cancer Mother   . Diabetes Father        siblings  . Heart disease Father        brother  . Heart attack Father   . Kidney disease Sister   . Heart failure Sister   . Heart disease Brother   . Heart attack Son 39  . Drug abuse Sister   . Heart disease Brother   . Deep vein thrombosis Brother   . Colon polyps Neg Hx     Social History   Tobacco Use  . Smoking status: Former Smoker    Quit date: 02/08/1969    Years since quitting: 50.1  . Smokeless tobacco: Former Systems developer    Quit date: 1971  Substance Use Topics  . Alcohol use: No    Alcohol/week: 0.0 standard drinks  . Drug use: No    Home Medications Prior to Admission medications   Medication Sig Start Date End Date Taking? Authorizing Provider  albuterol (ACCUNEB) 1.25 MG/3ML nebulizer solution Take 1 ampule by nebulization every 6 (six) hours as needed for wheezing.    [provider]  albuterol (PROVENTIL HFA;VENTOLIN HFA) 108 (90 Base) MCG/ACT inhaler Inhale 2 puffs into the lungs every 6 (six) hours as needed for wheezing or shortness of breath. Patient not taking: Reported on 03/15/2019 07/20/17   Brand Males, MD  amitriptyline (ELAVIL) 25 MG tablet 1/2 pill each bedtime x 1 week, then 1 pill nightly x 1 week, then 1 1/2 pills nightly x 1 week, then 2 pills nightly thereafter. 03/14/19   Star Age, MD  atorvastatin (LIPITOR) 40 MG tablet Take 1 tablet (40 mg total) by mouth daily. 02/15/19   [name], [name], MD  bisoprolol (ZEBETA) 5 MG tablet Take 0.5 tablets (2.5 mg total) by mouth daily. 10/05/18    [name], [name], MD  Blood Glucose Monitoring Suppl (ACCU-CHEK AVIVA) device Use as instructed 04/18/18 04/18/19  [name], [name], MD  Blood Pressure Monitoring (BLOOD PRESSURE CUFF) MISC 1 each by Does not apply route daily. 05/22/18   [name], [name], MD  budesonide-formoterol (SYMBICORT) 160-4.5 MCG/ACT inhaler Inhale 2 puffs into the lungs 2 (two) times daily. 01/09/18   Barton Dubois, MD  Cholecalciferol (VITAMIN D PO) Take 5,000 Units by mouth every morning.     [provider]  clonazePAM (KLONOPIN) 0.5 MG tablet Take 1 tablet (0.5 mg total) by mouth at bedtime as needed. 10/05/18   [name], [name], MD  furosemide (LASIX) 40 MG tablet Take 2 tablets by mouth twice daily 02/27/19   Almyra Deforest, PA  glimepiride [facility])  4 MG tablet Take 1 tablet (4 mg total) by mouth 3 (three) times daily. 02/15/19   [name], [name], MD  glucose blood (ACCU-CHEK AVIVA) test strip 1 each by Other route daily. Use as instructed 04/18/18   [name], [name], MD  hydrALAZINE (APRESOLINE) 10 MG tablet Take 1 tablet (10 mg total) by mouth 3 (three) times daily. 10/05/18 10/05/19  [name], [name], MD  hydroxychloroquine (PLAQUENIL) 200 MG tablet Take 2 tablets (400 mg total) by mouth daily. 12/28/18   Claretta Fraise, MD  isosorbide dinitrate (ISORDIL) 30 MG tablet Take 1 tablet (30 mg total) by mouth 3 (three) times daily. 01/16/19   [name], [name], MD  Lancets (ACCU-CHEK SOFT TOUCH) lancets 1 each by Other route daily. Use as instructed 04/18/18   [name], [name], MD  lisinopril (ZESTRIL) 20 MG tablet Take 1 tablet (20 mg total) by mouth daily. 03/09/19   Verta Ellen., NP  Misc. Devices (PULSE OXIMETER) MISC 1 each by Does not apply route continuous as needed. 07/26/18   [name], [name], MD  mometasone-formoterol (DULERA) 100-5 MCG/ACT AERO Inhale 2 puffs into the lungs 2 (two) times daily.    [provider]  [facility] DELICA LANCETS 39R MISC 1 each by Does not apply  route daily. Test 1X per day and prn 03/30/17   [name], [name], MD  OXYGEN Inhale 2 L into the lungs at bedtime. 2lpm with sleep     [provider]  Potassium Chloride ER 20 MEQ TBCR Take 1 tablet by mouth daily. 08/08/18   Almyra Deforest, PA  PRODIGY NO CODING BLOOD GLUC test strip CHECK BLOOD SUGARS ONCE DAILY 01/17/18   [name], [name], MD  rizatriptan (MAXALT) 5 MG tablet Take 1 tablet (5 mg total) by mouth as needed for migraine. May repeat in 2 hours if needed 02/15/19   [name], [name], MD  sertraline (ZOLOFT) 50 MG tablet Take 1 tablet (50 mg total) by mouth daily. 10/05/18   [name], [name], MD  traMADol (ULTRAM) 50 MG tablet Take 1 tablet (50 mg total) by mouth every 6 (six) hours as needed. 01/24/19   Claretta Fraise, MD    Allergies    Amlodipine besy-benazepril hcl, Oxycodone, Phenergan [promethazine hcl], and Hydrocodone  Review of Systems   Review of Systems  Respiratory: Positive for shortness of breath.   Cardiovascular: Positive for chest pain.  Ten systems reviewed and are negative for acute change, except as noted in the HPI.    Physical Exam Updated Vital Signs BP (!) 164/87   Pulse 66   Temp 97.9 F (36.6 C) (Oral)   Resp 18   Ht _0  (1.702 m)   Wt 128.8 kg   SpO2 94%   BMI 44.48 kg/m   Physical Exam Vitals and nursing note reviewed.  Constitutional:      General: He is not in acute distress.    Appearance: He is well-developed. He is not diaphoretic.     Comments: Obese male. Nontoxic appearing.  HENT:     Head: Normocephalic and atraumatic.  Eyes:     General: No scleral icterus.    Conjunctiva/sclera: Conjunctivae normal.  Cardiovascular:     Rate and Rhythm: Normal rate and regular rhythm.     Pulses: Normal pulses.  Pulmonary:     Effort: Pulmonary effort is normal. No respiratory distress.     Breath sounds: No stridor. No wheezing, rhonchi or rales.     Comments: Lungs  CTAB. Respirations even and  unlabored. Musculoskeletal:        General: Normal range of motion.     Cervical back: Normal range of motion.     Comments: No BLE edema  Skin:    General: Skin is warm and dry.     Coloration: Skin is not pale.     Findings: No erythema or rash.  Neurological:     Mental Status: He is alert and oriented to person, place, and time.  Psychiatric:        Behavior: Behavior normal.     ED Results / Procedures / Treatments   Labs (all labs ordered are listed, but only abnormal results are displayed) Labs Reviewed  BASIC METABOLIC PANEL - Abnormal; Notable for the following components:      Result Value   Glucose, Bld 176 (*)    GFR calc non Af Amer 59 (*)    All other components within normal limits  CBC - Abnormal; Notable for the following components:   Hemoglobin 12.7 (*)    MCH 25.9 (*)    All other components within normal limits  BRAIN NATRIURETIC PEPTIDE - Abnormal; Notable for the following components:   B Natriuretic Peptide 174.0 (*)    All other components within normal limits  CBG MONITORING, ED  TROPONIN I (HIGH SENSITIVITY)  TROPONIN I (HIGH SENSITIVITY)    EKG EKG Interpretation  Date/Time:  Tuesday April 03 2019 22:00:40 EST Ventricular Rate:  72 PR Interval:    QRS Duration: 105 QT Interval:  420 QTC Calculation: 460 R Axis:   73 Text Interpretation: Sinus rhythm Normal ECG Confirmed by Veryl Speak [phone]) on 04/03/2019 11:26:13 PM   Radiology DG Chest 2 View  Result Date: 04/03/2019 CLINICAL DATA:  Cough and short of breath EXAM: CHEST - 2 VIEW COMPARISON:  12/05/2018, 12/29/2018 FINDINGS: Surgical hardware in the cervical spine. No focal opacity or pleural effusion. Stable cardiomediastinal silhouette with aortic atherosclerosis. No pneumothorax. IMPRESSION: No active cardiopulmonary disease. Electronically Signed   By: Donavan Foil M.D.   On: 04/03/2019 19:41    Procedures Procedures (including critical care time)  Left Heart Cath  12/02/2016  Mid LAD lesion, 10 %stenosed.  Mid RCA lesion, 10 %stenosed.  The left ventricular systolic function is normal.  LV end diastolic pressure is mildly elevated.  The left ventricular ejection fraction is 55-65% by visual estimate.  There is no aortic valve stenosis.  Echocardiogram 09/24/2018 IMPRESSIONS  1. The left ventricle has low normal systolic function, with an ejection  fraction of 50-55%. The cavity size was moderately dilated. There is mild  concentric left ventricular hypertrophy. Diastolic dysfunction, grade  indeterminate. Indeterminate filling  pressures.  2. The right ventricle has normal systolic function. The cavity was  normal. There is no increase in right ventricular wall thickness.  3. The aortic valve is tricuspid. Mild thickening of the aortic valve.  Mild to moderate aortic annular calcification noted.  4. The mitral valve is grossly normal. There is mild mitral annular  calcification present.  5. The aorta is normal unless otherwise noted.    Medications Ordered in ED Medications  hydrALAZINE (APRESOLINE) tablet 10 mg (10 mg Oral Given 04/03/19 2300)  isosorbide dinitrate (ISORDIL) tablet 30 mg (30 mg Oral Given 04/03/19 2300)  furosemide (LASIX) tablet 40 mg (40 mg Oral Given 04/03/19 2245)    ED Course  I have reviewed the triage vital signs and the nursing notes.  Pertinent labs & imaging results that  were available during my care of the patient were reviewed by me and considered in my medical decision making (see chart for details).  Clinical Course as of Apr 04 47  Wed Apr 04, 2019  0031 BP 144/81 at bedside. Patient finishing up a [employer] meal.   [KH]    Clinical Course User Index [KH] Beverely Pace   MDM Rules/Calculators/A&P                      74 year old male presents to the emergency department for vague complaints of generalized weakness.  He further reports ongoing issues with hypertension as well as  headaches.  These problems have been ongoing for at least the past few weeks.  Has been followed by neurology for his daily headaches.  Is supposed to undergo MRI for further evaluation this weekend.  Denies any change in the characteristics of his headache today.  Questions whether his headaches may be directly related to his uncontrolled hypertension.  Patient was profoundly hypertensive on arrival with initial blood pressure of 216/111.  He was given his home medications while in the emergency department with improvement in his blood pressure down to 144/81.  While patient has been taking his prescribed medicines, he has a very poor diet that is likely high in sodium content.  Discussed with him that this could be contributing to his poor blood pressure control.  Patient also is consistently noncompliant with his CPAP usage.  I have encouraged that he try and use this nightly as previously instructed.  Patient's evaluation in the emergency department today has been reassuring.  His EKG shows normal sinus rhythm without signs of acute ischemia.  He has a negative troponin x2.  Also history of cardiac catheterization in 2018 with minimal CAD.  He had a reassuring and fairly stable echocardiogram 6 months ago.  Chest x-ray without acute cardiopulmonary abnormality.  He does not have any clinical or other objective findings of fluid overload.  BNP is stable.  Do not feel the patient warrants further inpatient assessment/evaluation, especially given symptom chronicity.  He has been counseled on lifestyle changes as well as the need for outpatient primary care follow-up, cardiology follow-up, neurology follow-up.  Return precautions discussed and provided. Patient discharged in stable condition with no unaddressed concerns.  Vitals:   04/03/19 2245 04/03/19 2315 04/04/19 0000 04/04/19 0015  BP: (!) 181/89 (!) 163/96 (!) 164/87 (!) 144/81  Pulse: 69 72 66 66  Resp: 16 (!) 26 18 (!) 21  Temp:      TempSrc:       SpO2: 94% 92% 94% 98%  Weight:      Height:        Final Clinical Impression(s) / ED Diagnoses Final diagnoses:  Transient hypertension    Rx / DC Orders ED Discharge Orders    None       Antonietta Breach, PA-C 04/04/19 0543    Veryl Speak, MD 04/04/19 [phone]

## 2019-04-03 NOTE — Telephone Encounter (Signed)
Given elevated BP and headache you need to go to ED.

## 2019-04-03 NOTE — Telephone Encounter (Signed)
Contact patient- he was short of breath on the phone while trying to tell me what was going on, he states his blood pressure has been high all day and unable to get it down- also states he has had a headache, vision changes, and having some active chest pains. Patient states he does have some swelling in his extremities as well. With his symptoms at this time I suggested patient go to ER. He can have someone drive him or call [phone]. Patient advised and I notified him I would send a message to MD to make aware.

## 2019-04-03 NOTE — ED Triage Notes (Signed)
Patient sent by cardiologist for evaluation. Patient reports hypertension x 3 weeks. Patient reports chest pain and shortness of breath onset of yesterday. Left sided chest pain along with jaw discomfort and left arm pain. C/o severe headache intermittent for past few days. Equal grip strength and sensation.

## 2019-04-03 NOTE — Telephone Encounter (Signed)
Unable to reach patient by phone.  Left detailed message explaining need to go to emergency room on voicemail.

## 2019-04-03 NOTE — Telephone Encounter (Signed)
Kenneth Fuller patient; he has been having problems with his blood pressure recently but today his blood pressure is elevated and he has a very bad heacache and is unsure of what he should do.  His readings today are 186/117, 202/104, and 176/112.  Please advise.

## 2019-04-04 ENCOUNTER — Encounter: Payer: Self-pay | Admitting: Family Medicine

## 2019-04-04 ENCOUNTER — Ambulatory Visit (INDEPENDENT_AMBULATORY_CARE_PROVIDER_SITE_OTHER): Payer: PPO | Admitting: Family Medicine

## 2019-04-04 ENCOUNTER — Telehealth: Payer: Self-pay | Admitting: Family Medicine

## 2019-04-04 DIAGNOSIS — R030 Elevated blood-pressure reading, without diagnosis of hypertension: Secondary | ICD-10-CM

## 2019-04-04 MED ORDER — LISINOPRIL 20 MG PO TABS: 40.0000 mg | ORAL_TABLET | Freq: Every day | ORAL | 1 refills | Status: DC

## 2019-04-04 NOTE — Telephone Encounter (Signed)
The patient has been made aware and will call back next week with an update.  He did have a virtual visit today with his PCP who has increased his Lisinopril to 40 mg once daily.

## 2019-04-04 NOTE — Telephone Encounter (Signed)
I saw the record of his ED visit. No serious problems there. Please check BP at home once a day and send me a log after about a week.

## 2019-04-04 NOTE — Progress Notes (Signed)
Virtual Visit via telephone Note Due to COVID-19 pandemic this visit was conducted virtually. This visit type was conducted due to national recommendations for restrictions regarding the COVID-19 Pandemic (e.g. social distancing, sheltering in place) in an effort to limit this patient's exposure and mitigate transmission in our community. All issues noted in this document were discussed and addressed.  A physical exam was not performed with this format.   I connected with Kenneth Fuller on 04/04/2019 at 1320 by telephone and verified that I am speaking with the correct person using two identifiers. Kenneth Fuller is currently located at home and no one is currently with them during visit. The provider, Monia Pouch, FNP is located in their office at time of visit.  I discussed the limitations, risks, security and privacy concerns of performing an evaluation and management service by telephone and the availability of in person appointments. I also discussed with the patient that there may be a patient responsible charge related to this service. The patient expressed understanding and agreed to proceed.  Subjective:  Patient ID: Kenneth Fuller, male    DOB: 04/22/1945, 74 y.o.   MRN: [id number]  Chief Complaint:  Hypertension   HPI: Kenneth Fuller is a 74 y.o. male presenting on 04/04/2019 for Hypertension   Pt reports continued elevated blood pressure. He was seen in the ED last night for elevated blood pressure. States he continues to have a slight headache and occasional floaters. No focal deficits, weakness, confusion, or slurred speech. Pt states he took his apresoline about 1 hour ago and checked his blood pressure was 166/106 at that time. Pt asked to check BP while on phone and reports a BP reading of 168/88. Pt is scheduled for a MRI on Saturday and to see the hypertensive clinic on 04/15/2019.  Hypertension This is a chronic problem. The current episode started more than  1 year ago. The problem has been waxing and waning since onset. The problem is uncontrolled. Associated symptoms include anxiety and headaches. Pertinent negatives include no blurred vision, chest pain, malaise/fatigue, neck pain, orthopnea, palpitations, peripheral edema, PND, shortness of breath or sweats.     Relevant past medical, surgical, family, and social history reviewed and updated as indicated.  Allergies and medications reviewed and updated.   Past Medical History:  Diagnosis Date  . Allergy   . Asthma   . CAD (coronary artery disease)   . CHF (congestive heart failure) ([place])   . Diabetes mellitus   . Fibromyalgia   . GERD (gastroesophageal reflux disease)   . GI bleeding   . Gout   . Hyperlipidemia   . Hypertension   . Hypogonadism male   . Insomnia   . MRSA cellulitis   . Neuropathy   . Obesity   . Shortness of breath dyspnea    with exertion   . Sleep apnea    cpap- 14   . Wheezing    no asthma diagnosis    Past Surgical History:  Procedure Laterality Date  . BACK SURGERY    . CARDIAC CATHETERIZATION    . COLONOSCOPY    . LEFT HEART CATH AND CORONARY ANGIOGRAPHY N/A 12/02/2016   Procedure: LEFT HEART CATH AND CORONARY ANGIOGRAPHY;  Surgeon: Jettie Booze, MD;  Location: [facility];  Service: Cardiovascular;  Laterality: N/A;  . LUMBAR LAMINECTOMY/DECOMPRESSION MICRODISCECTOMY N/A 01/02/2014   Procedure: CENTRAL DECOMPRESSION LUMBAR LAMINECTOMY L3-L4, L4-L5;  Surgeon: Tobi Bastos, MD;  Location: [facility];  Service: Orthopedics;  Laterality: N/A;  . neck fusion      Social History   Socioeconomic History  . Marital status: Married    Spouse name: Butch Penny  . Number of children: 3  . Years of education: 8  . Highest education level: GED or equivalent  Occupational History  . 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 Smoking status: Former Smoker    Quit date: 02/08/1969    Years  since quitting: 50.1  . Smokeless tobacco: Former Systems developer    Quit date: 1971  Substance and Sexual Activity  . Alcohol use: No    Alcohol/week: 0.0 standard drinks  . Drug use: No  . Sexual activity: Not Currently  Other Topics Concern  . Not on file  Social History Narrative   Drinks caffeine tea occasionally    Social Determinants of Health   Financial Resource Strain: Low Risk   . Difficulty of Paying Living Expenses: Not hard at all  Food Insecurity: No Food Insecurity  . Worried About Charity fundraiser in the Last Year: Never true  . Ran Out of Food in the Last Year: Never true  Transportation Needs: No Transportation Needs  . Lack of Transportation (Medical): No  . Lack of Transportation (Non-Medical): No  Physical Activity: Inactive  . Days of Exercise per Week: 0 days  . Minutes of Exercise per Session: 0 min  Stress: No Stress Concern Present  . Feeling of Stress : Only a little  Social Connections: Not Isolated  . Frequency of Communication with Friends and Family: More than three times a week  . Frequency of Social Gatherings with Friends and Family: More than three times a week  . Attends Religious Services: More than 4 times per year  . Active Member of Clubs or Organizations: Yes  . Attends Archivist Meetings: More than 4 times per year  . Marital Status: Married  Human resources officer Violence: Not At Risk  . Fear of Current or Ex-Partner: No  . Emotionally Abused: No  . Physically Abused: No  . Sexually Abused: No    Outpatient Encounter Medications as of 04/04/2019  Medication Sig  . albuterol (ACCUNEB) 1.25 MG/3ML nebulizer solution Take 1 ampule by nebulization every 6 (six) hours as needed for wheezing.  [name] albuterol (PROVENTIL HFA;VENTOLIN HFA) 108 (90 Base) MCG/ACT inhaler Inhale 2 puffs into the lungs every 6 (six) hours as needed for wheezing or shortness of breath. (Patient not taking: Reported on 03/15/2019)  . amitriptyline (ELAVIL) 25 MG  tablet 1/2 pill each bedtime x 1 week, then 1 pill nightly x 1 week, then 1 1/2 pills nightly x 1 week, then 2 pills nightly thereafter.  [name] atorvastatin (LIPITOR) 40 MG tablet Take 1 tablet (40 mg total) by mouth daily.  . bisoprolol (ZEBETA) 5 MG tablet Take 0.5 tablets (2.5 mg total) by mouth daily.  . Blood Glucose Monitoring Suppl (ACCU-CHEK AVIVA) device Use as instructed  . Blood Pressure Monitoring (BLOOD PRESSURE CUFF) MISC 1 each by Does not apply route daily.  . budesonide-formoterol (SYMBICORT) 160-4.5 MCG/ACT inhaler Inhale 2 puffs into the lungs 2 (two) times daily.  . Cholecalciferol (VITAMIN D PO) Take 5,000 Units by mouth every morning.   . clonazePAM (KLONOPIN) 0.5 MG tablet Take 1 tablet (0.5 mg total) by mouth at bedtime as needed.  . furosemide (LASIX) 40 MG tablet Take 2 tablets by mouth twice daily  .  glimepiride (AMARYL) 4 MG tablet Take 1 tablet (4 mg total) by mouth 3 (three) times daily.  [name] glucose blood (ACCU-CHEK AVIVA) test strip 1 each by Other route daily. Use as instructed  . hydrALAZINE (APRESOLINE) 10 MG tablet Take 1 tablet (10 mg total) by mouth 3 (three) times daily.  . hydroxychloroquine (PLAQUENIL) 200 MG tablet Take 2 tablets (400 mg total) by mouth daily.  . isosorbide dinitrate (ISORDIL) 30 MG tablet Take 1 tablet (30 mg total) by mouth 3 (three) times daily.  . Lancets (ACCU-CHEK SOFT TOUCH) lancets 1 each by Other route daily. Use as instructed  . lisinopril (ZESTRIL) 20 MG tablet Take 2 tablets (40 mg total) by mouth daily.  . Misc. Devices (PULSE OXIMETER) MISC 1 each by Does not apply route continuous as needed.  . mometasone-formoterol (DULERA) 100-5 MCG/ACT AERO Inhale 2 puffs into the lungs 2 (two) times daily.  [name] DELICA LANCETS 02D MISC 1 each by Does not apply route daily. Test 1X per day and prn  . OXYGEN Inhale 2 L into the lungs at bedtime. 2lpm with sleep   . Potassium Chloride ER 20 MEQ TBCR Take 1 tablet by mouth daily.  [name]  PRODIGY NO CODING BLOOD GLUC test strip CHECK BLOOD SUGARS ONCE DAILY  . rizatriptan (MAXALT) 5 MG tablet Take 1 tablet (5 mg total) by mouth as needed for migraine. May repeat in 2 hours if needed  . sertraline (ZOLOFT) 50 MG tablet Take 1 tablet (50 mg total) by mouth daily.  . traMADol (ULTRAM) 50 MG tablet Take 1 tablet (50 mg total) by mouth every 6 (six) hours as needed.  . [DISCONTINUED] lisinopril (ZESTRIL) 20 MG tablet Take 1 tablet (20 mg total) by mouth daily.   No facility-administered encounter medications on file as of 04/04/2019.    Allergies  Allergen Reactions  . Amlodipine Besy-Benazepril Hcl Swelling and Other (See Comments)    Makes tongue swell (lotrel)  . Oxycodone Itching  . Phenergan [Promethazine Hcl] Other (See Comments)    "I can't remember."  . Hydrocodone Itching    Can tolerate in low doses    Review of Systems  Constitutional: Negative for activity change, appetite change, chills, diaphoresis, fatigue, fever, malaise/fatigue and unexpected weight change.  HENT: Negative.   Eyes: Positive for visual disturbance. Negative for blurred vision.  Respiratory: Negative for cough, chest tightness and shortness of breath.   Cardiovascular: Negative for chest pain, palpitations, orthopnea, leg swelling and PND.  Gastrointestinal: Negative for abdominal pain, blood in stool, constipation, diarrhea, nausea and vomiting.  Endocrine: Negative.   Genitourinary: Negative for decreased urine volume, difficulty urinating, dysuria, frequency and urgency.  Musculoskeletal: Negative for arthralgias, myalgias and neck pain.  Skin: Negative.   Allergic/Immunologic: Negative.   Neurological: Positive for headaches. Negative for dizziness, tremors, seizures, syncope, facial asymmetry, speech difficulty, weakness, light-headedness and numbness.  Hematological: Negative.   Psychiatric/Behavioral: Negative for confusion, hallucinations, sleep disturbance and suicidal ideas. The  patient is nervous/anxious.   All other systems reviewed and are negative.        Observations/Objective: No vital signs or physical exam, this was a telephone or virtual health encounter.  Pt alert and oriented, answers all questions appropriately, and able to speak in full sentences.    Assessment and Plan: Kenneth Fuller was seen today for hypertension.  Diagnoses and all orders for this visit:  Transient hypertension BP 168/88 during telephone visit. Will increase lisinopril to 40 mg daily. Pt to take additional 20  mg now. Pt aware of symptoms that require emergent evaluation and treatment. Pt to keep follow up with hypertensive clinic and to have MRI completed. Pt to follow up in 2 weeks for follow up or sooner if needed.  -     lisinopril (ZESTRIL) 20 MG tablet; Take 2 tablets (40 mg total) by mouth daily.     Follow Up Instructions: Return in about 2 weeks (around 04/18/2019), or if symptoms worsen or fail to improve, for HTN.    I discussed the assessment and treatment plan with the patient. The patient was provided an opportunity to ask questions and all were answered. The patient agreed with the plan and demonstrated an understanding of the instructions.   The patient was advised to call back or seek an in-person evaluation if the symptoms worsen or if the condition fails to improve as anticipated.  The above assessment and management plan was discussed with the patient. The patient verbalized understanding of and has agreed to the management plan. Patient is aware to call the clinic if they develop any new symptoms or if symptoms persist or worsen. Patient is aware when to return to the clinic for a follow-up visit. Patient educated on when it is appropriate to go to the emergency department.    I provided 25 minutes of non-face-to-face time during this encounter. The call started at 1320. The call ended at 1345. The other time was used for coordination of care.    Monia Pouch, FNP-C [facility] [address] [place], [place] [address] [phone] 04/04/2019

## 2019-04-04 NOTE — Telephone Encounter (Signed)
Appointment scheduled 04/11/2019 at 8:10 am with Dr. Warrick Parisian for telephone visit.  Patient aware.

## 2019-04-04 NOTE — ED Notes (Signed)
Discharge instructions reviewed with patient; verbalized understanding. Pt a/o x4, vss, ambulates with steady gait.

## 2019-04-04 NOTE — Discharge Instructions (Signed)
Try to limit your salt intake as well as your consumption of processed foods as this can worsen your blood pressure.  Continue taking your medications as prescribed.  It would be extremely beneficial for you to use your CPAP nightly, as instructed.  We recommend follow-up with your primary care doctor within 1 week for recheck of your blood pressure.  You should also continue with your scheduled visits to cardiology and neurology.  Return for any new or concerning symptoms.

## 2019-04-06 ENCOUNTER — Telehealth: Payer: Self-pay | Admitting: *Deleted

## 2019-04-06 DIAGNOSIS — I5042 Chronic combined systolic (congestive) and diastolic (congestive) heart failure: Secondary | ICD-10-CM

## 2019-04-06 DIAGNOSIS — I10 Essential (primary) hypertension: Secondary | ICD-10-CM

## 2019-04-06 MED ORDER — HYDRALAZINE HCL 25 MG PO TABS: 25.0000 mg | ORAL_TABLET | Freq: Three times a day (TID) | ORAL | 3 refills | Status: DC

## 2019-04-06 NOTE — Telephone Encounter (Signed)
Call placed to the patient concerning his patient advice message about his high blood pressures. This afternoon it was 181/111. His PCP had recently increased his Lisinopril to 40 mg once daily.  While on the phone, the patient's blood pressure was 177/104. He is asymptomatic. He did state that he is under a lot of stress right now and would like something to help "calm him down." He has been advised to reach out to his PCP concerning this.  Per Dr. Sallyanne Kuster, the patient may increase his Hydralazine to 25 mg three times daily. A new prescription has been sent in.  The patient has been advised to keep a log of his pressures and call back next week with an update.

## 2019-04-07 ENCOUNTER — Other Ambulatory Visit: Payer: Self-pay

## 2019-04-07 ENCOUNTER — Ambulatory Visit
Admission: RE | Admit: 2019-04-07 | Discharge: 2019-04-07 | Disposition: A | Discharge status: Home / Self Care | Payer: PPO | Source: Ambulatory Visit | Attending: Neurology | Admitting: Neurology

## 2019-04-07 DIAGNOSIS — G4489 Other headache syndrome: Secondary | ICD-10-CM

## 2019-04-07 MED ORDER — GADOBENATE DIMEGLUMINE 529 MG/ML IV SOLN
20.0000 mL | Freq: Once | INTRAVENOUS | Status: AC | PRN
  Administered 2019-04-07: 20 mL via INTRAVENOUS

## 2019-04-09 ENCOUNTER — Telehealth: Payer: Self-pay

## 2019-04-09 ENCOUNTER — Telehealth: Payer: Self-pay | Admitting: Neurology

## 2019-04-09 NOTE — Telephone Encounter (Signed)
Attempted to reach the patient. Left a message to call back.

## 2019-04-09 NOTE — Telephone Encounter (Signed)
Follow Up:      Returning your call from today.

## 2019-04-09 NOTE — Telephone Encounter (Signed)
Pt daughter called(she was told because she is not on DPR no information could be relayed to her to she put her mother on call)Nicosia,Donna(wife on DPR) Wife is asking for a call from RN to discuss the results to the CT scan that show on mychart

## 2019-04-09 NOTE — Telephone Encounter (Signed)
I reached out to the pt's wife and left a vm advising we are waiting on the result of the MRI to be finalized.    I advised we would call back once result is in.

## 2019-04-09 NOTE — Telephone Encounter (Signed)
Contacted patient regarding mychart message that was sent regarding blood pressure- it was 200/125- he states he took his meds as instructed, and the change since 2/26- but states no change has been made at all and he is concerned. He is lightheaded, has some vision changes with seeing spots, and has a headache. He denies chest pain, but states he does have SOB, and swelling in his extremities. They stated that the pharmacy would not do the increased dose of hydralazine but told him to do 2 1/2 three times a day- until he ran out, and then they would fill the RX. I advised I would send a message to Dr.C to advise of blood pressure issue.

## 2019-04-09 NOTE — Telephone Encounter (Signed)
I reached out to the pt and advised of results. Pt's daughter was on the line during the call as well.

## 2019-04-09 NOTE — Telephone Encounter (Signed)
Pt's daughter called states to only call her father at [phone] when the results are in.

## 2019-04-09 NOTE — Telephone Encounter (Signed)
IMPRESSION:   MRI brain (with and without) demonstrating: - Mild periventricular and subcortical chronic small vessel ischemic disease.  - No acute findings.  Please call patient, MRI of the brain showed mild supratentorium small vessel disease, there was no acute abnormalities.  This is consistent with his age, hypertension, hyperlipidemia, diabetes, please make sure he is on antiplatelet agent, if not, he should take aspirin 81 mg daily,

## 2019-04-09 NOTE — Telephone Encounter (Signed)
Spoke with the patient and his daughter. He stated that he increased the Hydralazine to 25 mg tid on Friday 2/26. Today his blood pressure was 200/125. He was symptomatic with complaints of a headache, blurry vision and overall weakness.  He went to bed to rest and while on the phone his blood pressure was 155/83 and heart rate 68.   He has been advised to try to relax and rest since that seems to make his blood pressure go down, avoid salty foods and to stay hydrated. If his blood pressure gets that high again and he is symptomatic, he has been advised to seek emergent care to get it lower. He has been educated on the risks of high blood pressure.   An appointment has been made for 3/3 with Dr. Sallyanne Kuster.

## 2019-04-11 ENCOUNTER — Encounter: Payer: Self-pay | Admitting: Family Medicine

## 2019-04-11 ENCOUNTER — Other Ambulatory Visit: Payer: Self-pay

## 2019-04-11 ENCOUNTER — Ambulatory Visit (INDEPENDENT_AMBULATORY_CARE_PROVIDER_SITE_OTHER): Payer: PPO | Admitting: Cardiovascular Disease

## 2019-04-11 ENCOUNTER — Encounter: Payer: Self-pay | Admitting: Cardiovascular Disease

## 2019-04-11 ENCOUNTER — Ambulatory Visit (INDEPENDENT_AMBULATORY_CARE_PROVIDER_SITE_OTHER): Payer: PPO | Admitting: Family Medicine

## 2019-04-11 VITALS — BP 164/104 | HR 77 | Ht 67.0 in | Wt 284.0 lb

## 2019-04-11 DIAGNOSIS — G4733 Obstructive sleep apnea (adult) (pediatric): Secondary | ICD-10-CM

## 2019-04-11 DIAGNOSIS — I5042 Chronic combined systolic (congestive) and diastolic (congestive) heart failure: Secondary | ICD-10-CM

## 2019-04-11 DIAGNOSIS — I110 Hypertensive heart disease with heart failure: Secondary | ICD-10-CM | POA: Diagnosis not present

## 2019-04-11 DIAGNOSIS — J449 Chronic obstructive pulmonary disease, unspecified: Secondary | ICD-10-CM | POA: Diagnosis not present

## 2019-04-11 DIAGNOSIS — N1831 Chronic kidney disease, stage 3a: Secondary | ICD-10-CM

## 2019-04-11 DIAGNOSIS — E669 Obesity, unspecified: Secondary | ICD-10-CM

## 2019-04-11 DIAGNOSIS — I10 Essential (primary) hypertension: Secondary | ICD-10-CM

## 2019-04-11 DIAGNOSIS — Z9989 Dependence on other enabling machines and devices: Secondary | ICD-10-CM

## 2019-04-11 DIAGNOSIS — E1169 Type 2 diabetes mellitus with other specified complication: Secondary | ICD-10-CM

## 2019-04-11 DIAGNOSIS — I25118 Atherosclerotic heart disease of native coronary artery with other forms of angina pectoris: Secondary | ICD-10-CM

## 2019-04-11 DIAGNOSIS — E785 Hyperlipidemia, unspecified: Secondary | ICD-10-CM

## 2019-04-11 DIAGNOSIS — I5032 Chronic diastolic (congestive) heart failure: Secondary | ICD-10-CM | POA: Diagnosis not present

## 2019-04-11 DIAGNOSIS — R0902 Hypoxemia: Secondary | ICD-10-CM | POA: Diagnosis not present

## 2019-04-11 DIAGNOSIS — R0600 Dyspnea, unspecified: Secondary | ICD-10-CM | POA: Diagnosis not present

## 2019-04-11 MED ORDER — FUROSEMIDE 40 MG PO TABS: ORAL_TABLET | ORAL | 3 refills | Status: DC

## 2019-04-11 MED ORDER — SPIRONOLACTONE 25 MG PO TABS: 25.0000 mg | ORAL_TABLET | Freq: Every day | ORAL | 3 refills | Status: DC

## 2019-04-11 MED ORDER — HYDRALAZINE HCL 25 MG PO TABS: 25.0000 mg | ORAL_TABLET | Freq: Three times a day (TID) | ORAL | 3 refills | Status: DC

## 2019-04-11 NOTE — Patient Instructions (Addendum)
Medication Instructions:  START Spironolactone 25 mg once daily Furosemide: Take 80 mg twice daily (2 tablets) until your weight gets down to 275 lbs then decrease it back to 40 mg twice daily.   *If you need a refill on your cardiac medications before your next appointment, please call your pharmacy*   Lab Work: Your provider would like for you to have the following lab on 3/9 at your next appointment: BMET   If you have labs (blood work) drawn today and your tests are completely normal, you will receive your results only by: [name] MyChart Message (if you have MyChart) OR . A paper copy in the mail If you have any lab test that is abnormal or we need to change your treatment, we will call you to review the results.   Testing/Procedures: None ordered   Follow-Up: Keep your follow up with the Hypertension clinic on 04/17/2019 at 9:30.  We recommend signing up for the patient portal called "MyChart".  Sign up information is provided on this After Visit Summary.  MyChart is used to connect with patients for Virtual Visits (Telemedicine).  Patients are able to view lab/test results, encounter notes, upcoming appointments, etc.  Non-urgent messages can be sent to your provider as well.   To learn more about what you can do with MyChart, go to [email or web address].

## 2019-04-11 NOTE — Progress Notes (Signed)
Virtual Visit via telephone Note  I connected with Kenneth Fuller on 04/11/19 at [place] by telephone and verified that I am speaking with the correct person using two identifiers. Kenneth Fuller is currently located at home and no other people are currently with her during visit. The provider, [name] Yassmine Tamm, MD is located in their office at time of visit.  Call ended at [place]  I discussed the limitations, risks, security and privacy concerns of performing an evaluation and management service by telephone and the availability of in person appointments. I also discussed with the patient that there may be a patient responsible charge related to this service. The patient expressed understanding and agreed to proceed.  167/107 History and Present Illness: Patient was in the ed on 04/03/19 and is still having high BP and headaches and still seeing floaters and blurred vision and bp is still being very elevated. Patient is unchanged from ED visit.  He denies any chest pain or shortness of breath but still has the headache and the floaters that comes and goes that his blood pressure still goes up and down.  They did adjust one of his medications that hydralazine and down the ER but he went back home to taking it at the previous doses because they did not give a new prescription for more of it.  1. Essential hypertension   2. Chronic combined systolic and diastolic congestive heart failure [facility])     Outpatient Encounter Medications as of 04/11/2019  Medication Sig  . albuterol (ACCUNEB) 1.25 MG/3ML nebulizer solution Take 1 ampule by nebulization every 6 (six) hours as needed for wheezing.  [name] albuterol (PROVENTIL HFA;VENTOLIN HFA) 108 (90 Base) MCG/ACT inhaler Inhale 2 puffs into the lungs every 6 (six) hours as needed for wheezing or shortness of breath. (Patient not taking: Reported on 03/15/2019)  . amitriptyline (ELAVIL) 25 MG tablet 1/2 pill each bedtime x 1 week, then 1 pill nightly x 1  week, then 1 1/2 pills nightly x 1 week, then 2 pills nightly thereafter.  [name] atorvastatin (LIPITOR) 40 MG tablet Take 1 tablet (40 mg total) by mouth daily.  . bisoprolol (ZEBETA) 5 MG tablet Take 0.5 tablets (2.5 mg total) by mouth daily.  . Blood Glucose Monitoring Suppl (ACCU-CHEK AVIVA) device Use as instructed  . Blood Pressure Monitoring (BLOOD PRESSURE CUFF) MISC 1 each by Does not apply route daily.  . budesonide-formoterol (SYMBICORT) 160-4.5 MCG/ACT inhaler Inhale 2 puffs into the lungs 2 (two) times daily.  . Cholecalciferol (VITAMIN D PO) Take 5,000 Units by mouth every morning.   . clonazePAM (KLONOPIN) 0.5 MG tablet Take 1 tablet (0.5 mg total) by mouth at bedtime as needed.  . furosemide (LASIX) 40 MG tablet Take 2 tablets by mouth twice daily  . glimepiride (AMARYL) 4 MG tablet Take 1 tablet (4 mg total) by mouth 3 (three) times daily.  [name] glucose blood (ACCU-CHEK AVIVA) test strip 1 each by Other route daily. Use as instructed  . hydrALAZINE (APRESOLINE) 25 MG tablet Take 1-2 tablets (25-50 mg total) by mouth 3 (three) times daily.  . hydroxychloroquine (PLAQUENIL) 200 MG tablet Take 2 tablets (400 mg total) by mouth daily.  . isosorbide dinitrate (ISORDIL) 30 MG tablet Take 1 tablet (30 mg total) by mouth 3 (three) times daily.  . Lancets (ACCU-CHEK SOFT TOUCH) lancets 1 each by Other route daily. Use as instructed  . lisinopril (ZESTRIL) 20 MG tablet Take 2 tablets (40 mg total) by mouth daily.  [name]  Misc. Devices (PULSE OXIMETER) MISC 1 each by Does not apply route continuous as needed.  . mometasone-formoterol (DULERA) 100-5 MCG/ACT AERO Inhale 2 puffs into the lungs 2 (two) times daily.  [name] DELICA LANCETS 21F MISC 1 each by Does not apply route daily. Test 1X per day and prn  . OXYGEN Inhale 2 L into the lungs at bedtime. 2lpm with sleep   . Potassium Chloride ER 20 MEQ TBCR Take 1 tablet by mouth daily.  [name] PRODIGY NO CODING BLOOD GLUC test strip CHECK BLOOD SUGARS ONCE  DAILY  . rizatriptan (MAXALT) 5 MG tablet Take 1 tablet (5 mg total) by mouth as needed for migraine. May repeat in 2 hours if needed  . sertraline (ZOLOFT) 50 MG tablet Take 1 tablet (50 mg total) by mouth daily.  . traMADol (ULTRAM) 50 MG tablet Take 1 tablet (50 mg total) by mouth every 6 (six) hours as needed.  . [DISCONTINUED] hydrALAZINE (APRESOLINE) 25 MG tablet Take 1 tablet (25 mg total) by mouth 3 (three) times daily.   No facility-administered encounter medications on file as of 04/11/2019.    Review of Systems  Constitutional: Negative for chills and fever.  Eyes: Positive for visual disturbance. Negative for discharge.  Respiratory: Negative for shortness of breath and wheezing.   Cardiovascular: Negative for chest pain and leg swelling.  Musculoskeletal: Negative for back pain and gait problem.  Skin: Negative for rash.  Neurological: Positive for headaches. Negative for dizziness, weakness and numbness.  All other systems reviewed and are negative.   Observations/Objective: Patient sounds comfortable and in no acute distress  Assessment and Plan: Problem List Items Addressed This Visit      Cardiovascular and Mediastinum   Essential hypertension - Primary   Relevant Medications   hydrALAZINE (APRESOLINE) 25 MG tablet   CHF (congestive heart failure) (HCC)   Relevant Medications   hydrALAZINE (APRESOLINE) 25 MG tablet      Will increase hydralazine to where he can take 1 to 225 mg tablets, will currently he is taking 210 mg tablets. Follow up plan: Return in about 3 weeks (around 05/02/2019), or if symptoms worsen or fail to improve, for Hypertension recheck.     I discussed the assessment and treatment plan with the patient. The patient was provided an opportunity to ask questions and all were answered. The patient agreed with the plan and demonstrated an understanding of the instructions.   The patient was advised to call back or seek an in-person evaluation if  the symptoms worsen or if the condition fails to improve as anticipated.  The above assessment and management plan was discussed with the patient. The patient verbalized understanding of and has agreed to the management plan. Patient is aware to call the clinic if symptoms persist or worsen. Patient is aware when to return to the clinic for a follow-up visit. Patient educated on when it is appropriate to go to the emergency department.    I provided 20 minutes of non-face-to-face time during this encounter.    Worthy Rancher, MD

## 2019-04-11 NOTE — Progress Notes (Signed)
Cardiology Office Note    Date:  04/11/2019   ID:  Kenneth Fuller, DOB 10/24/1945, MRN [id number]  PCP:  [name], Kenneth Kaufmann, MD  Cardiologist:   Kenneth Klein, MD   Chief Complaint  Patient presents with  . Congestive Heart Failure  . Sleep Apnea    History of Present Illness:  Kenneth Fuller is a 74 y.o. male with chronic diastolic heart failure, morbid obesity, obstructive sleep apnea, hyperlipidemia, diabetes mellitus complicated by neuropathy, minor nonobstructive coronary atherosclerosis, moderately severe restrictive lung disease.  He has a history of angioedema with ACE inhibitors.  He has been under tremendous emotional stress recently.  His wife has advanced cirrhosis and problems with hepatic encephalopathy.  She sleeps most of the day and has periods of confusion.  His children are helping.  They have been married for 10 years.  His blood pressure has been persistently severely elevated.  He has increased problems with chest tightness and dyspnea that are present at rest and worsened with exertion.  Appears to be NYHA functional class III.  He does not have overt orthopnea, PND or any leg edema.  He had a chest x-ray performed in the emergency room on February 23 that did not show overt heart failure.  BNP on the same day was elevated at 174.  This is similar to his BNP during hospitalization for heart failure in August 2070 (176).  He had a fairly recent coronary angiogram in October 2018 that showed a maximum stenosis of 10% in the mid LAD and mid RCA.  His echocardiogram showed evidence of normal left ventricular systolic function (EF 70-01%) and diastolic dysfunction with indeterminate filling pressures (E/e'=12-13).  He has had problems with chest tightness before with congestive heart failure, improving after diuresis.  His "dry weight" appears to be around 270 lb.  He does describe eating more recently and being less physically active, during the COVID-19  pandemic.    He was hospitalized for severe shortness of breath from November 27 through January 09, 2018.   On admission his BNP was slightly elevated at 330 (previous baseline 72.6 in 2017).  An echo during that hospitalization which was a mediocre quality study but showed an ejection fraction decreased at 45-50%.  During his hospitalization fo CHF in August 2020 the BNP was elevated to 176. He diuresed 10 liters.  He underwent cardiac catheterization in October 2018 he has preserved left ventricular systolic function and minimal coronary artery disease.  LVEDP was borderline at the time of catheterization (10-14 mmHg).  His echo did show grade 2 diastolic dysfunction.  However pulmonary function test had shown fairly severe obstructive lung disease with FEV1 in the 40-45% of predicted range.  He did not have much improvement with bronchodilators either on the PFTs or clinically.  He does have a history of roughly 5 pack years of smoking, but quit 40 years ago.  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  Chest CT did not show evidence of severe interstitial lung disease.    Past Medical History:  Diagnosis Date  . Allergy   . Asthma   . CAD (coronary artery disease)   . CHF (congestive heart failure) ([place])   . Diabetes mellitus   . Fibromyalgia   . GERD (gastroesophageal reflux disease)   . GI bleeding   . Gout   . Hyperlipidemia   . Hypertension   . Hypogonadism male   . Insomnia   .  MRSA cellulitis   . Neuropathy   . Obesity   . Shortness of breath dyspnea    with exertion   . Sleep apnea    cpap- 14   . Wheezing    no asthma diagnosis    Past Surgical History:  Procedure Laterality Date  . BACK SURGERY    . CARDIAC CATHETERIZATION    . COLONOSCOPY    . LEFT HEART CATH AND CORONARY ANGIOGRAPHY N/A 12/02/2016   Procedure: LEFT HEART CATH AND CORONARY ANGIOGRAPHY;  Surgeon: Jettie Booze, MD;  Location: [facility];  Service:  Cardiovascular;  Laterality: N/A;  . LUMBAR LAMINECTOMY/DECOMPRESSION MICRODISCECTOMY N/A 01/02/2014   Procedure: CENTRAL DECOMPRESSION LUMBAR LAMINECTOMY L3-L4, L4-L5;  Surgeon: Tobi Bastos, MD;  Location: [facility];  Service: Orthopedics;  Laterality: N/A;  . neck fusion      Current Medications: Outpatient Medications Prior to Visit  Medication Sig Dispense Refill  . albuterol (ACCUNEB) 1.25 MG/3ML nebulizer solution Take 1 ampule by nebulization every 6 (six) hours as needed for wheezing.    [name] albuterol (PROVENTIL HFA;VENTOLIN HFA) 108 (90 Base) MCG/ACT inhaler Inhale 2 puffs into the lungs every 6 (six) hours as needed for wheezing or shortness of breath. 1 Inhaler 2  . amitriptyline (ELAVIL) 25 MG tablet 1/2 pill each bedtime x 1 week, then 1 pill nightly x 1 week, then 1 1/2 pills nightly x 1 week, then 2 pills nightly thereafter. 60 tablet 3  . atorvastatin (LIPITOR) 40 MG tablet Take 1 tablet (40 mg total) by mouth daily. 90 tablet 3  . bisoprolol (ZEBETA) 5 MG tablet Take 0.5 tablets (2.5 mg total) by mouth daily. 45 tablet 3  . Blood Glucose Monitoring Suppl (ACCU-CHEK AVIVA) device Use as instructed 1 each 0  . Blood Pressure Monitoring (BLOOD PRESSURE CUFF) MISC 1 each by Does not apply route daily. 1 each 0  . budesonide-formoterol (SYMBICORT) 160-4.5 MCG/ACT inhaler Inhale 2 puffs into the lungs 2 (two) times daily. 1 Inhaler 3  . Cholecalciferol (VITAMIN D PO) Take 5,000 Units by mouth every morning.     . clonazePAM (KLONOPIN) 0.5 MG tablet Take 1 tablet (0.5 mg total) by mouth at bedtime as needed. 30 tablet 2  . glimepiride (AMARYL) 4 MG tablet Take 1 tablet (4 mg total) by mouth 3 (three) times daily. 270 tablet 3  . glucose blood (ACCU-CHEK AVIVA) test strip 1 each by Other route daily. Use as instructed 100 each 12  . hydrALAZINE (APRESOLINE) 25 MG tablet Take 1-2 tablets (25-50 mg total) by mouth 3 (three) times daily. 540 tablet 3  . hydroxychloroquine (PLAQUENIL) 200  MG tablet Take 2 tablets (400 mg total) by mouth daily. 40 tablet 0  . isosorbide dinitrate (ISORDIL) 30 MG tablet Take 1 tablet (30 mg total) by mouth 3 (three) times daily. 90 tablet 3  . Lancets (ACCU-CHEK SOFT TOUCH) lancets 1 each by Other route daily. Use as instructed 100 each 12  . lisinopril (ZESTRIL) 20 MG tablet Take 2 tablets (40 mg total) by mouth daily. 180 tablet 1  . Misc. Devices (PULSE OXIMETER) MISC 1 each by Does not apply route continuous as needed. 1 each 0  . mometasone-formoterol (DULERA) 100-5 MCG/ACT AERO Inhale 2 puffs into the lungs 2 (two) times daily.    [name] DELICA LANCETS 66Q MISC 1 each by Does not apply route daily. Test 1X per day and prn 100 each 2  . OXYGEN Inhale 2 L into the lungs  at bedtime. 2lpm with sleep     . Potassium Chloride ER 20 MEQ TBCR Take 1 tablet by mouth daily. 90 tablet 3  . PRODIGY NO CODING BLOOD GLUC test strip CHECK BLOOD SUGARS ONCE DAILY 50 each 11  . rizatriptan (MAXALT) 5 MG tablet Take 1 tablet (5 mg total) by mouth as needed for migraine. May repeat in 2 hours if needed 10 tablet 0  . sertraline (ZOLOFT) 50 MG tablet Take 1 tablet (50 mg total) by mouth daily. 90 tablet 1  . traMADol (ULTRAM) 50 MG tablet Take 1 tablet (50 mg total) by mouth every 6 (six) hours as needed. 15 tablet 0  . furosemide (LASIX) 40 MG tablet Take 2 tablets by mouth twice daily 270 tablet 0   No facility-administered medications prior to visit.     Allergies:   Amlodipine besy-benazepril hcl, Oxycodone, Phenergan [promethazine hcl], and Hydrocodone   Social History   Socioeconomic History  . Marital status: Married    Spouse name: Butch Penny  . Number of children: 3  . Years of education: 8  . Highest education level: GED or equivalent  Occupational History  . 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 Smoking status: Former Smoker    Quit date: 02/08/1969    Years since quitting: 50.2  .  Smokeless tobacco: Former Systems developer    Quit date: 1971  Substance and Sexual Activity  . Alcohol use: No    Alcohol/week: 0.0 standard drinks  . Drug use: No  . Sexual activity: Not Currently  Other Topics Concern  . Not on file  Social History Narrative   Drinks caffeine tea occasionally    Social Determinants of Health   Financial Resource Strain: Low Risk   . Difficulty of Paying Living Expenses: Not hard at all  Food Insecurity: No Food Insecurity  . Worried About Charity fundraiser in the Last Year: Never true  . Ran Out of Food in the Last Year: Never true  Transportation Needs: No Transportation Needs  . Lack of Transportation (Medical): No  . Lack of Transportation (Non-Medical): No  Physical Activity: Inactive  . Days of Exercise per Week: 0 days  . Minutes of Exercise per Session: 0 min  Stress: No Stress Concern Present  . Feeling of Stress : Only a little  Social Connections: Not Isolated  . Frequency of Communication with Friends and Family: More than three times a week  . Frequency of Social Gatherings with Friends and Family: More than three times a week  . Attends Religious Services: More than 4 times per year  . Active Member of Clubs or Organizations: Yes  . Attends Archivist Meetings: More than 4 times per year  . Marital Status: Married     Family History:  The patient's family history includes Colon cancer in his mother; Deep vein thrombosis in his brother; Diabetes in his father; Drug abuse in his sister; Heart attack in his father; Heart attack (age of onset: 66) in his son; Heart disease in his brother, brother, and father; Heart failure in his sister; Kidney disease in his sister.   ROS:   Please see the history of present illness.    ROS all other symptoms are reviewed and are negative  PHYSICAL EXAM:   VS:  BP (!) 164/104   Pulse 77   Ht _0  (1.702 m)   Wt 284 lb (128.8 kg)  BMI 44.48 kg/m     General: Alert, oriented x3, no  distress, exam limited by morbid obesity Head: no evidence of trauma, PERRL, EOMI, no exophtalmos or lid lag, no myxedema, no xanthelasma; normal ears, nose and oropharynx Neck: normal jugular venous pulsations and no hepatojugular reflux; brisk carotid pulses without delay and no carotid bruits Chest: clear to auscultation, no signs of consolidation by percussion or palpation, normal fremitus, symmetrical and full respiratory excursions Cardiovascular: normal position and quality of the apical impulse, regular rhythm, normal first and second heart sounds, no murmurs, rubs or gallops Abdomen: no tenderness or distention, no masses by palpation, no abnormal pulsatility or arterial bruits, normal bowel sounds, no hepatosplenomegaly Extremities: no clubbing, cyanosis or edema; 2+ radial, ulnar and brachial pulses bilaterally; 2+ right femoral, posterior tibial and dorsalis pedis pulses; 2+ left femoral, posterior tibial and dorsalis pedis pulses; no subclavian or femoral bruits Neurological: grossly nonfocal Psych: Normal mood and affect   Wt Readings from Last 3 Encounters:  04/11/19 284 lb (128.8 kg)  04/03/19 284 lb (128.8 kg)  03/14/19 284 lb (128.8 kg)      Studies/Labs Reviewed:   EKG:  EKG is ordered today.  It shows normal sinus rhythm and is a completely normal tracing. Recent Labs: 02/15/2019: ALT 16 04/03/2019: B Natriuretic Peptide 174.0; BUN 19; Creatinine, Ser 1.22; Hemoglobin 12.7; Platelets 262; Potassium 4.1; Sodium 138   Lipid Panel    Component Value Date/Time   CHOL 81 (L) 02/15/2019 1403   CHOL 111 06/12/2012 1232   TRIG 142 02/15/2019 1403   TRIG 141 01/24/2014 1143   TRIG 192 (H) 06/12/2012 1232   HDL 38 (L) 02/15/2019 1403   HDL 38 (L) 01/24/2014 1143   HDL 33 (L) 06/12/2012 1232   CHOLHDL 2.1 02/15/2019 1403   CHOLHDL 4.8 11/22/2006 1712   VLDL 39 11/22/2006 1712   LDLCALC 19 02/15/2019 1403   LDLCALC 27 10/25/2013 1224   LDLCALC 40 06/12/2012 1232     Labs from 11/29/2016 Total cholesterol 89, HDL 36, LDL 30, triglycerides 113, normal liver function tests, hemoglobin 13.7 02/25/2017 Creatinine 1.12, potassium 4.2, BNP 97, TSH 3.3 03/15/2017 Hemoglobin A1c 7.6%  ASSESSMENT:    1. Chronic combined systolic and diastolic heart failure ([place])   2. Chronic obstructive pulmonary disease, unspecified COPD type ([place])   3. OSA on CPAP   4. Essential hypertension   5. Coronary artery disease involving native coronary artery of native heart with other form of angina pectoris ([place])   6. Dyslipidemia (high LDL; low HDL)   7. Diabetes mellitus type 2 in obese ([place])   8. Stage 3a chronic kidney disease   9. Obesity, Class III, BMI 40-49.9 (morbid obesity) ([place])      PLAN:  In order of problems listed above:  1. CHF: He has worsening exertional dyspnea, now functional class III and has chest tightness (which in the past was associated with heart failure exacerbation).  Will increase furosemide to 80 mg twice daily and add low-dose spironolactone.  Continue the increased dose of furosemide until his weight is under 275 pounds, then go back to 40 mg twice daily.  Dr. Warrick Parisian recently increased his prescription for hydralazine to 50 mg 3 times daily.  He has a follow-up appointment in the hypertension clinic in a week.  We will recheck labs at that time. 2. COPD: Previous baseline dyspnea was likely related to this diagnosis.  Superimposed worsening at this time likely related to persistently elevated blood pressure  what appears to be about 10-15 pounds of fluid overload.  He had markedly abnormal PFTs in the past which I thought were responsible for his chronic dyspnea.  Prefer to use bisoprolol since it is the most selective beta-blocker. 3. OSA: He would like to try the nasal pillows and we sent a prescription today.  Reports 100% compliance with therapy and denies daytime hypersomnolence. 4. HTN: Blood pressure is very high despite multiple  medications including beta-blocker, hydralazine/nitrates, ACE inhibitor.  A lot of this has to do with emotional stress, but this is unlikely to abate anytime soon.  Increase loop diuretic, add spironolactone.  Hydralazine recently adjusted as well.  Target BP 130/80. 5. CAD: ECG performed today while he was experiencing chest tightness is normal.  As before, it is likely that his chest tightness is related to heart failure exacerbation/demand ischemia due to increased filling pressures.  There is no indication to repeat coronary angiography.  Repeat coronary angiography in October 2018 showed only minor nonobstructive atherosclerosis, no progression from 2011 6. HLP: Outstanding lipid parameters on current medications. 7. DM: Good glycemic control with hemoglobin A1c 6.5% 8. CKD: Typically his creatinine is in the 1.25- 1.40 range.  Most recent creatinine was actually better at 1.14 on January 7. 9. Morbid obesity limits assessment of volume status and is clearly a big part of any residual dyspnea.  Weight loss would be highly beneficial. 10. Emotional/family stress: Very difficult situation at home with his wife experiencing complications of what appears to be terminal liver disease.     Medication Adjustments/Labs and Tests Ordered: Current medicines are reviewed at length with the patient today.  Concerns regarding medicines are outlined above.  Medication changes, Labs and Tests ordered today are listed in the Patient Instructions below. Patient Instructions  Medication Instructions:  START Spironolactone 25 mg once daily Furosemide: Take 80 mg twice daily (2 tablets) until your weight gets down to 275 lbs then decrease it back to 40 mg twice daily.   *If you need a refill on your cardiac medications before your next appointment, please call your pharmacy*   Lab Work: Your provider would like for you to have the following lab on 3/9 at your next appointment: BMET   If you have labs  (blood work) drawn today and your tests are completely normal, you will receive your results only by: [name] MyChart Message (if you have MyChart) OR . A paper copy in the mail If you have any lab test that is abnormal or we need to change your treatment, we will call you to review the results.   Testing/Procedures: None ordered   Follow-Up: Keep your follow up with the Hypertension clinic on 04/17/2019 at 9:30.  We recommend signing up for the patient portal called "MyChart".  Sign up information is provided on this After Visit Summary.  MyChart is used to connect with patients for Virtual Visits (Telemedicine).  Patients are able to view lab/test results, encounter notes, upcoming appointments, etc.  Non-urgent messages can be sent to your provider as well.   To learn more about what you can do with MyChart, go to [email or web address].        Signed, Kenneth Klein, MD  04/11/2019 12:36 PM    [address], [place], [place]  [address] Phone: [phone]; Fax: [phone]

## 2019-04-13 ENCOUNTER — Ambulatory Visit (INDEPENDENT_AMBULATORY_CARE_PROVIDER_SITE_OTHER): Payer: PPO | Admitting: Licensed Clinical Social Worker

## 2019-04-13 DIAGNOSIS — E119 Type 2 diabetes mellitus without complications: Secondary | ICD-10-CM | POA: Diagnosis not present

## 2019-04-13 DIAGNOSIS — J441 Chronic obstructive pulmonary disease with (acute) exacerbation: Secondary | ICD-10-CM

## 2019-04-13 DIAGNOSIS — I2510 Atherosclerotic heart disease of native coronary artery without angina pectoris: Secondary | ICD-10-CM

## 2019-04-13 DIAGNOSIS — F339 Major depressive disorder, recurrent, unspecified: Secondary | ICD-10-CM

## 2019-04-13 DIAGNOSIS — E1142 Type 2 diabetes mellitus with diabetic polyneuropathy: Secondary | ICD-10-CM | POA: Diagnosis not present

## 2019-04-13 DIAGNOSIS — I5042 Chronic combined systolic (congestive) and diastolic (congestive) heart failure: Secondary | ICD-10-CM

## 2019-04-13 NOTE — Patient Instructions (Addendum)
Licensed Clinical Social Worker Visit Information  Goals we discussed today:  Goals        . Client derstands the importance of follow-up with providers by attending scheduled visits    . Client has stress related to ongoing health issues and wishes to talk more about stress over current health issues (pt-stated)     Current Barriers:  [name] Mental Health Concerns  in patient with Chronic Diagnoses of CAD, DM Type II, Depression,recurrent, CKD, COPD and CHF .  Challenges related to managing health issues of client   Clinical Social Work Clinical Goal(s):  [name] Over the next 30 days, client will work with LCSW to address concerns related to health issues of client and client management of health issues faced  Interventions:  Talked with client about pain issues of client  Talked with client about breathing issues of client  Talked with client about his current needs  Talked with client about his upcoming medical appointments  Previously collaborated with [facility] regarding nursing needs of client  Talked with client about health needs of his spouse  Talked with client about current stress issues (family member had a miscarriage, spouse of client is ill, ongoing client health issues)  Talked with client about sleeping challenges of client  Patient Self Care Activities:  . Self administers medications as prescribed . Attends all scheduled provider appointments . Performs ADL's independently   Plan:  LCSW to call client in next 4 weeks to talk with client about stress issues related to his management of health issues faced Client to communicate with RNCM to discuss nursing needs of client Client to attend scheduled client medical appointments   Initial goal documentation      Materials Provided: No  Follow Up Plan:  LCSW to call client in next 4 weeks to talk with client about stress issues related to his management of health issues faced  The patient verbalized understanding of  instructions provided today and declined a print copy of patient instruction materials.   [name].Domanique Luckett MSW, LCSW Licensed Clinical Social Worker [facility] [phone]

## 2019-04-13 NOTE — Chronic Care Management (AMB) (Signed)
Care Management Note   Kenneth Fuller is a 74 y.o. year old male who is a primary care patient of Kenneth Fuller. The CM team was consulted for assistance with chronic disease management and care coordination.   I reached out to Kenneth Fuller via phone today   Review of patient status, including review of consultants reports, relevant laboratory and other test results, and collaboration with appropriate care team members and the patient's provider was performed as part of comprehensive patient evaluation and provision of chronic care management services.   Social determinants of health: risk of tobacco use; risk of depression; risk of physical inactivity    Office Visit from 12/14/2017 in [facility]  PHQ-9 Total Score  12     GAD 7 : Generalized Anxiety Score 11/11/2017  Nervous, Anxious, on Edge 3  Control/stop worrying 3  Worry too much - different things 3  Trouble relaxing 3  Restless 3  Easily annoyed or irritable 3  Afraid - awful might happen 0  Total GAD 7 Score 18  Anxiety Difficulty Somewhat difficult   Medications   (very important)  New medications from outside sources are available for reconciliation  albuterol (ACCUNEB) 1.25 MG/3ML nebulizer solution albuterol (PROVENTIL HFA;VENTOLIN HFA) 108 (90 Base) MCG/ACT inhaler amitriptyline (ELAVIL) 25 MG tablet atorvastatin (LIPITOR) 40 MG tablet bisoprolol (ZEBETA) 5 MG tablet Blood Glucose Monitoring Suppl (ACCU-CHEK AVIVA) device Blood Pressure Monitoring (BLOOD PRESSURE CUFF) MISC budesonide-formoterol (SYMBICORT) 160-4.5 MCG/ACT inhaler Cholecalciferol (VITAMIN D PO) clonazePAM (KLONOPIN) 0.5 MG tablet furosemide (LASIX) 40 MG tablet glimepiride (AMARYL) 4 MG tablet glucose blood (ACCU-CHEK AVIVA) test strip hydrALAZINE (APRESOLINE) 25 MG tablet hydroxychloroquine (PLAQUENIL) 200 MG tablet isosorbide dinitrate (ISORDIL) 30 MG tablet Lancets (ACCU-CHEK SOFT TOUCH)  lancets lisinopril (ZESTRIL) 20 MG tablet Misc. Devices (PULSE OXIMETER) MISC mometasone-formoterol (DULERA) 100-5 MCG/ACT AERO ONETOUCH DELICA LANCETS 40J MISC OXYGEN Potassium Chloride ER 20 MEQ TBCR PRODIGY NO CODING BLOOD GLUC test strip rizatriptan (MAXALT) 5 MG tablet sertraline (ZOLOFT) 50 MG tablet spironolactone (ALDACTONE) 25 MG tablet traMADol (ULTRAM) 50 MG tablet  Goals        . Client derstands the importance of follow-up with providers by attending scheduled visits     Current Barriers:  [name] Mental Health Concerns  in patient with Chronic Diagnoses of CAD, DM Type II, Depression,recurrent, CKD, COPD and CHF .  Challenges related to managing health issues of client   Clinical Social Work Clinical Goal(s):  [name] Over the next 30 days, client will work with LCSW to address concerns related to health issues of client and client management of health issues faced  Interventions:   Talked with client about pain issues of client   Talked with client about breathing issues of client  Talked with client about his current needs  Talked with client about his upcoming medical appointments  Previously collaborated with [facility] regarding nursing needs of client  Talked with client about health needs of his spouse  Talked with client about current stress issues (family member had a miscarriage, spouse of client is ill, ongoing client health issues)  Talked with client about sleeping challenges of client  Patient Self Care Activities:  . Self administers medications as prescribed . Attends all scheduled provider appointments . Performs ADL's independently   Plan:  LCSW to call client in next 4 weeks to talk with client about stress issues related to his management of health issues faced Client to communicate with RNCM to discuss nursing needs of  client Client to attend scheduled client medical appointments   Initial goal documentation        Follow Up Plan: LCSW to call  client in next 4 weeks to talk with client about stress issues related to management of his health issues faced  Kenneth Fuller MSW, LCSW Licensed Clinical Social Worker [facility] [phone]

## 2019-04-17 ENCOUNTER — Ambulatory Visit (INDEPENDENT_AMBULATORY_CARE_PROVIDER_SITE_OTHER): Payer: PPO | Admitting: Pharmacist Clinician (PhC)/ Clinical Pharmacy Specialist

## 2019-04-17 ENCOUNTER — Other Ambulatory Visit: Payer: Self-pay

## 2019-04-17 DIAGNOSIS — R0600 Dyspnea, unspecified: Secondary | ICD-10-CM | POA: Diagnosis not present

## 2019-04-17 DIAGNOSIS — I10 Essential (primary) hypertension: Secondary | ICD-10-CM

## 2019-04-17 DIAGNOSIS — R0902 Hypoxemia: Secondary | ICD-10-CM | POA: Diagnosis not present

## 2019-04-17 DIAGNOSIS — I5042 Chronic combined systolic (congestive) and diastolic (congestive) heart failure: Secondary | ICD-10-CM | POA: Diagnosis not present

## 2019-04-17 LAB — BASIC METABOLIC PANEL
BUN/Creatinine Ratio: 17 (ref 10–24)
BUN: 23 mg/dL (ref 8–27)
CO2: 28 mmol/L (ref 20–29)
Calcium: 9.4 mg/dL (ref 8.6–10.2)
Chloride: 98 mmol/L (ref 96–106)
Creatinine, Ser: 1.39 mg/dL — ABNORMAL HIGH (ref 0.76–1.27)
GFR calc Af Amer: 58 mL/min/{1.73_m2} — ABNORMAL LOW (ref >59)
GFR calc non Af Amer: 50 mL/min/{1.73_m2} — ABNORMAL LOW (ref >59)
Glucose: 128 mg/dL — ABNORMAL HIGH (ref 65–99)
Potassium: 4.4 mmol/L (ref 3.5–5.2)
Sodium: 141 mmol/L (ref 134–144)

## 2019-04-17 NOTE — Progress Notes (Signed)
04/17/2019 Kenneth Fuller 1945/06/30 [id number]   HPI:  Kenneth Fuller is a 74 y.o. male patient of Dr Sallyanne Kuster, with a PMH below who presents today for hypertension clinic evaluation.  He was seen in the office last week and found to have a BP of 164/104.  In addition to hypertension, his pertinent medical history is listed below.  He noted that he has been under a lot of emotional stress, as his wife has been diagnosed with advanced cirrhosis, and he is her primary caregiver.  He does get some help from his children.  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  Dealing with the desires of some to re-open them as soon as possible, while others prefer to wait, has been very stressful for him as well.   When he saw Dr. Sallyanne Kuster last week his furosemide dose was increased, with a taper once his weight stable.  He was also given spironolactone 25 mg daily.    He returns today for follow up.  He does note some dry mouth with the spironolactone, but otherwise no complaints.  He will get metabolic panel drawn today.  His home BP readings have improved some (see below), but he notes, still mostly elevated.  He has concerns that should his stressors fade, his blood pressure might drop too low.     Past Medical History: Chronic diastolic HF NYHA functional class III; EF normal at 50-55%  DM2 1/21 A1c at 8.0, on glimepiride 4 mg daily  Hyperlipidemia 1/21:  TC 81, TG 142, HDL 38, LDL 19 - On atorvastatin 40  Non-obstructive CAD Angiogram 2018 max 10% stenosis in mid LAD and mid RCA  OSA Uses CPAP, new rx for nasal pillow, current causes dry mouth    Blood Pressure Goal:  130/80  Current Medications: bisoprolol 2.5 mg qd, hydralazine 25-50 mg tid, lisinopril 40 mg qd, spironolactone  Family Hx:  DM, HTN, CHF in father, died at 45 from MI; mother had hx of cancer, died at 49; 17 sisters, 5 bothers - 5 deceased - 1 from  CHF, 2 others probable; 3 children 1 son  with hypertension at 45  Social Hx: no tobacco or alcohol; drinks decaf 1 cup per day; no caffeinated sodas, mainly water  Diet: mix of home and restaurnat.  Had chef salad last night; tries to eat lots of vegetables, mostly frozen, some canned; eats most meats and fish  Exercise: no regular exercise - has issues with back, difficutl to stand for any length of time; back surgery 4-5 years ago; helped legs but not back so much  Home BP readings: no home readings with him. This am was 141/88  Intolerances: angioedema with benazepril/amlodipine combo tablet, does well with lisinopril  Labs: 04/03/19:  Na 138, K 4.1, Gllu 138, BUN 19, SCr 1.22  Wt Readings from Last 3 Encounters:  04/17/19 282 lb (127.9 kg)  04/11/19 284 lb (128.8 kg)  04/03/19 284 lb (128.8 kg)   BP Readings from Last 3 Encounters:  04/17/19 128/72  04/11/19 (!) 164/104  04/04/19 (!) 144/81   Pulse Readings from Last 3 Encounters:  04/17/19 68  04/11/19 77  04/04/19 66    Current Outpatient Medications  Medication Sig Dispense Refill  . albuterol (ACCUNEB) 1.25 MG/3ML nebulizer solution Take 1 ampule by nebulization every 6 (six) hours as needed for wheezing.    [name] albuterol (PROVENTIL HFA;VENTOLIN HFA) 108 (90 Base) MCG/ACT inhaler Inhale  2 puffs into the lungs every 6 (six) hours as needed for wheezing or shortness of breath. 1 Inhaler 2  . amitriptyline (ELAVIL) 25 MG tablet 1/2 pill each bedtime x 1 week, then 1 pill nightly x 1 week, then 1 1/2 pills nightly x 1 week, then 2 pills nightly thereafter. 60 tablet 3  . atorvastatin (LIPITOR) 40 MG tablet Take 1 tablet (40 mg total) by mouth daily. 90 tablet 3  . bisoprolol (ZEBETA) 5 MG tablet Take 0.5 tablets (2.5 mg total) by mouth daily. 45 tablet 3  . Blood Glucose Monitoring Suppl (ACCU-CHEK AVIVA) device Use as instructed 1 each 0  . Blood Pressure Monitoring (BLOOD PRESSURE CUFF) MISC 1 each by Does not apply route daily. 1 each 0  . budesonide-formoterol  (SYMBICORT) 160-4.5 MCG/ACT inhaler Inhale 2 puffs into the lungs 2 (two) times daily. 1 Inhaler 3  . Cholecalciferol (VITAMIN D PO) Take 5,000 Units by mouth every morning.     . clonazePAM (KLONOPIN) 0.5 MG tablet Take 1 tablet (0.5 mg total) by mouth at bedtime as needed. 30 tablet 2  . furosemide (LASIX) 40 MG tablet Take 80 mg (two tablets) twice daily until your weight gets down to 275 lbs then decrease it back to 40 mg twice daily. 120 tablet 3  . glimepiride (AMARYL) 4 MG tablet Take 1 tablet (4 mg total) by mouth 3 (three) times daily. 270 tablet 3  . glucose blood (ACCU-CHEK AVIVA) test strip 1 each by Other route daily. Use as instructed 100 each 12  . hydrALAZINE (APRESOLINE) 25 MG tablet Take 1-2 tablets (25-50 mg total) by mouth 3 (three) times daily. 540 tablet 3  . hydroxychloroquine (PLAQUENIL) 200 MG tablet Take 2 tablets (400 mg total) by mouth daily. 40 tablet 0  . isosorbide dinitrate (ISORDIL) 30 MG tablet Take 1 tablet (30 mg total) by mouth 3 (three) times daily. 90 tablet 3  . Lancets (ACCU-CHEK SOFT TOUCH) lancets 1 each by Other route daily. Use as instructed 100 each 12  . lisinopril (ZESTRIL) 20 MG tablet Take 2 tablets (40 mg total) by mouth daily. 180 tablet 1  . Misc. Devices (PULSE OXIMETER) MISC 1 each by Does not apply route continuous as needed. 1 each 0  . mometasone-formoterol (DULERA) 100-5 MCG/ACT AERO Inhale 2 puffs into the lungs 2 (two) times daily.    [name] DELICA LANCETS 76L MISC 1 each by Does not apply route daily. Test 1X per day and prn 100 each 2  . OXYGEN Inhale 2 L into the lungs at bedtime. 2lpm with sleep     . Potassium Chloride ER 20 MEQ TBCR Take 1 tablet by mouth daily. 90 tablet 3  . PRODIGY NO CODING BLOOD GLUC test strip CHECK BLOOD SUGARS ONCE DAILY 50 each 11  . rizatriptan (MAXALT) 5 MG tablet Take 1 tablet (5 mg total) by mouth as needed for migraine. May repeat in 2 hours if needed 10 tablet 0  . sertraline (ZOLOFT) 50 MG tablet  Take 1 tablet (50 mg total) by mouth daily. 90 tablet 1  . spironolactone (ALDACTONE) 25 MG tablet Take 1 tablet (25 mg total) by mouth daily. 30 tablet 3  . traMADol (ULTRAM) 50 MG tablet Take 1 tablet (50 mg total) by mouth every 6 (six) hours as needed. 15 tablet 0   No current facility-administered medications for this visit.    Allergies  Allergen Reactions  . Amlodipine Besy-Benazepril Hcl Swelling and Other (See Comments)  Makes tongue swell (lotrel)  . Oxycodone Itching  . Phenergan [Promethazine Hcl] Other (See Comments)    "I can't remember."  . Hydrocodone Itching    Can tolerate in low doses    Past Medical History:  Diagnosis Date  . Allergy   . Asthma   . CAD (coronary artery disease)   . CHF (congestive heart failure) ([place])   . Diabetes mellitus   . Fibromyalgia   . GERD (gastroesophageal reflux disease)   . GI bleeding   . Gout   . Hyperlipidemia   . Hypertension   . Hypogonadism male   . Insomnia   . MRSA cellulitis   . Neuropathy   . Obesity   . Shortness of breath dyspnea    with exertion   . Sleep apnea    cpap- 14   . Wheezing    no asthma diagnosis    Blood pressure 128/72, pulse 68, height _0  (1.702 m), weight 282 lb (127.9 kg).  Essential hypertension Patient with essential hypertension, now much improved in office with the addition of spironolactone 25 mg.  He will get repeat blood work today to check SCr and K.  We had a long discussion about the impact of stress on his pressure.  He doesn't see any changes in that stress level for the foreseeable future.   Explained that should his stressors decrease, we will need to have him let us know if there are significant drops to his home BP readings.  We can easily taper the hydralazine dose downward if that is to happen.  Will adjust his current prescription to read 50 mg tid.   He still has plenty of 10 and 25 mg tablets, which he will use up, before considering the 50 mg tablet.  Asked that he  continue with regular home BP monitoring, and should his pressure change significantly, he can call for advice.     Tommy Medal PharmD CPP [facility] [address] [place], [place] [address] [phone]

## 2019-04-17 NOTE — Patient Instructions (Addendum)
Call if you have any questions or you find your home BP readings are too high or too low.  Vonda Harth/Raquel at [phone]  Go to the lab today  Your blood pressure today is 128/72  Check your blood pressure at home once or twice daily and keep record of the readings.  Take your BP meds as follows:  Continue with all your current medications  Bring all of your meds, your BP cuff and your record of home blood pressures to your next appointment.  Exercise as you're able, try to walk approximately 30 minutes per day.  Keep salt intake to a minimum, especially watch canned and prepared boxed foods.  Eat more fresh fruits and vegetables and fewer canned items.  Avoid eating in fast food restaurants.    HOW TO TAKE YOUR BLOOD PRESSURE: . Rest 5 minutes before taking your blood pressure. .  Don't smoke or drink caffeinated beverages for at least 30 minutes before. . Take your blood pressure before (not after) you eat. . Sit comfortably with your back supported and both feet on the floor (don't cross your legs). . Elevate your arm to heart level on a table or a desk. . Use the proper sized cuff. It should fit smoothly and snugly around your bare upper arm. There should be enough room to slip a fingertip under the cuff. The bottom edge of the cuff should be 1 inch above the crease of the elbow. . Ideally, take 3 measurements at one sitting and record the average.

## 2019-04-17 NOTE — Assessment & Plan Note (Signed)
Patient with essential hypertension, now much improved in office with the addition of spironolactone 25 mg.  He will get repeat blood work today to check SCr and K.  We had a long discussion about the impact of stress on his pressure.  He doesn't see any changes in that stress level for the foreseeable future.   Explained that should his stressors decrease, we will need to have him let us know if there are significant drops to his home BP readings.  We can easily taper the hydralazine dose downward if that is to happen.  Will adjust his current prescription to read 50 mg tid.   He still has plenty of 10 and 25 mg tablets, which he will use up, before considering the 50 mg tablet.  Asked that he continue with regular home BP monitoring, and should his pressure change significantly, he can call for advice.

## 2019-04-17 NOTE — Progress Notes (Signed)
Thanks!

## 2019-05-01 ENCOUNTER — Other Ambulatory Visit: Payer: Self-pay | Admitting: *Deleted

## 2019-05-01 MED ORDER — GLUCOSE BLOOD VI STRP: ORAL_STRIP | 3 refills | Status: DC

## 2019-05-04 ENCOUNTER — Ambulatory Visit: Payer: Self-pay | Admitting: Licensed Clinical Social Worker

## 2019-05-04 DIAGNOSIS — F339 Major depressive disorder, recurrent, unspecified: Secondary | ICD-10-CM

## 2019-05-04 DIAGNOSIS — I2510 Atherosclerotic heart disease of native coronary artery without angina pectoris: Secondary | ICD-10-CM

## 2019-05-04 DIAGNOSIS — E119 Type 2 diabetes mellitus without complications: Secondary | ICD-10-CM | POA: Diagnosis not present

## 2019-05-04 DIAGNOSIS — I5042 Chronic combined systolic (congestive) and diastolic (congestive) heart failure: Secondary | ICD-10-CM

## 2019-05-04 DIAGNOSIS — J441 Chronic obstructive pulmonary disease with (acute) exacerbation: Secondary | ICD-10-CM

## 2019-05-04 DIAGNOSIS — E1142 Type 2 diabetes mellitus with diabetic polyneuropathy: Secondary | ICD-10-CM | POA: Diagnosis not present

## 2019-05-04 NOTE — Chronic Care Management (AMB) (Signed)
Care Management Note   Kenneth Fuller is a 74 y.o. year old male who is a primary care patient of [name], [name], MD. The CM team was consulted for assistance with chronic disease management and care coordination.   I reached out to Kenneth Fuller by phone today.    Review of patient status, including review of consultants reports, relevant laboratory and other test results, and collaboration with appropriate care team members and the patient's provider was performed as part of comprehensive patient evaluation and provision of chronic care management services.   Social determinants of health: risk of tobacco use; risk of depression; risk of physical inactivity    Office Visit from 12/14/2017 in [place]  PHQ-9 Total Score  12     GAD 7 : Generalized Anxiety Score 11/11/2017  Nervous, Anxious, on Edge 3  Control/stop worrying 3  Worry too much - different things 3  Trouble relaxing 3  Restless 3  Easily annoyed or irritable 3  Afraid - awful might happen 0  Total GAD 7 Score 18  Anxiety Difficulty Somewhat difficult   Medications   (very important)  New medications from outside sources are available for reconciliation  albuterol (ACCUNEB) 1.25 MG/3ML nebulizer solution albuterol (PROVENTIL HFA;VENTOLIN HFA) 108 (90 Base) MCG/ACT inhaler amitriptyline (ELAVIL) 25 MG tablet atorvastatin (LIPITOR) 40 MG tablet bisoprolol (ZEBETA) 5 MG tablet Blood Pressure Monitoring (BLOOD PRESSURE CUFF) MISC budesonide-formoterol (SYMBICORT) 160-4.5 MCG/ACT inhaler Cholecalciferol (VITAMIN D PO) clonazePAM (KLONOPIN) 0.5 MG tablet furosemide (LASIX) 40 MG tablet glimepiride (AMARYL) 4 MG tablet glucose blood test strip hydrALAZINE (APRESOLINE) 25 MG tablet hydroxychloroquine (PLAQUENIL) 200 MG tablet isosorbide dinitrate (ISORDIL) 30 MG tablet Lancets (ACCU-CHEK SOFT TOUCH) lancets lisinopril (ZESTRIL) 20 MG tablet Misc. Devices (PULSE OXIMETER)  MISC mometasone-formoterol (DULERA) 100-5 MCG/ACT AERO ONETOUCH DELICA LANCETS 15T MISC OXYGEN Potassium Chloride ER 20 MEQ TBCR rizatriptan (MAXALT) 5 MG tablet sertraline (ZOLOFT) 50 MG tablet spironolactone (ALDACTONE) 25 MG tablet traMADol (ULTRAM) 50 MG tablet  Goals        . Client has stress related to ongoing health issues and wishes to talk more about stress over current health issues (pt-stated)     Current Barriers:  [name] Mental Health Concerns  in patient with Chronic Diagnoses of CAD, DM Type II, Depression,recurrent, CKD, COPD and CHF .  Challenges related to managing health issues of client   Clinical Social Work Clinical Goal(s):  [name] Over the next 30 days, client will work with LCSW to address concerns related to health issues of client and client management of health issues faced  Interventions:   Previously talked with client about pain issues of client   Talked previously with client about breathing issues of client  Talked with client about his current needs  Previously collaborated with [facility] regarding nursing needs of client  Previously talked with client about health needs of his spouse  Talked previously with client about current stress issues (family member had a miscarriage, spouse of client is ill, ongoing client health issues)  Previously talked with client about sleeping challenges of client  Patient Self Care Activities:  . Self administers medications as prescribed . Attends all scheduled provider appointments . Performs ADL's independently   Plan:  LCSW to call client in next 4 weeks to talk with client about stress issues related to his management of health issues faced Client to communicate with RNCM to discuss nursing needs of client Client to attend scheduled client medical appointments  Initial goal documentation      Follow Up Plan: LCSW to call client in next 4 weeks to talk with client about stress issues related to health  issues of client  [name].Yianna Tersigni MSW, LCSW Licensed Clinical Social Worker [facility] [phone]

## 2019-05-04 NOTE — Patient Instructions (Addendum)
Licensed Clinical Social Worker Visit Information  Goals we discussed today:  Goals        . Client has stress related to ongoing health issues and wishes to talk more about stress over current health issues (pt-stated)     Current Barriers:  [name] Mental Health Concerns  in patient with Chronic Diagnoses of CAD, DM Type II, Depression,recurrent, CKD, COPD and CHF .  Challenges related to managing health issues of client   Clinical Social Work Clinical Goal(s):  [name] Over the next 30 days, client will work with LCSW to address concerns related to health issues of client and client management of health issues faced  Interventions: Previously talked with client about pain issues of client  Talked previously with client about breathing issues of client  Talked with client about his current needs  Previously collaborated with [facility] regarding nursing needs of client  Previously talked with client about health needs of his spouse  Talked previously with client about current stress issues (family member had a miscarriage, spouse of client is ill, ongoing client health issues)  Previously talked with client about sleeping challenges of client  Patient Self Care Activities:  . Self administers medications as prescribed . Attends all scheduled provider appointments . Performs ADL's independently   Plan:  LCSW to call client in next 4 weeks to talk with client about stress issues related to his management of health issues faced Client to communicate with RNCM to discuss nursing needs of client Client to attend scheduled client medical appointments   Initial goal documentation           Materials Provided: No  Follow Up Plan: LCSW to call client in next 4 weeks to talk with client about stress issues related to his management of health issues faced.  The patient verbalized understanding of instructions provided today and declined a print copy of patient instruction materials.   [name].Kenneth Fuller MSW, LCSW Licensed Clinical Social Worker [facility] [phone]

## 2019-05-09 ENCOUNTER — Other Ambulatory Visit: Payer: Self-pay | Admitting: Family Medicine

## 2019-05-11 DIAGNOSIS — G4733 Obstructive sleep apnea (adult) (pediatric): Secondary | ICD-10-CM | POA: Diagnosis not present

## 2019-05-12 ENCOUNTER — Other Ambulatory Visit: Payer: Self-pay | Admitting: Family Medicine

## 2019-05-12 DIAGNOSIS — R0600 Dyspnea, unspecified: Secondary | ICD-10-CM | POA: Diagnosis not present

## 2019-05-12 DIAGNOSIS — I5032 Chronic diastolic (congestive) heart failure: Secondary | ICD-10-CM | POA: Diagnosis not present

## 2019-05-12 DIAGNOSIS — R0902 Hypoxemia: Secondary | ICD-10-CM | POA: Diagnosis not present

## 2019-05-14 ENCOUNTER — Telehealth: Payer: Self-pay | Admitting: *Deleted

## 2019-05-14 ENCOUNTER — Other Ambulatory Visit: Payer: Self-pay

## 2019-05-14 ENCOUNTER — Ambulatory Visit (INDEPENDENT_AMBULATORY_CARE_PROVIDER_SITE_OTHER): Payer: PPO | Admitting: *Deleted

## 2019-05-14 DIAGNOSIS — N183 Chronic kidney disease, stage 3 unspecified: Secondary | ICD-10-CM | POA: Diagnosis not present

## 2019-05-14 DIAGNOSIS — I2510 Atherosclerotic heart disease of native coronary artery without angina pectoris: Secondary | ICD-10-CM | POA: Diagnosis not present

## 2019-05-14 DIAGNOSIS — F418 Other specified anxiety disorders: Secondary | ICD-10-CM | POA: Diagnosis not present

## 2019-05-14 DIAGNOSIS — I5042 Chronic combined systolic (congestive) and diastolic (congestive) heart failure: Secondary | ICD-10-CM | POA: Diagnosis not present

## 2019-05-14 DIAGNOSIS — E119 Type 2 diabetes mellitus without complications: Secondary | ICD-10-CM | POA: Diagnosis not present

## 2019-05-14 DIAGNOSIS — J441 Chronic obstructive pulmonary disease with (acute) exacerbation: Secondary | ICD-10-CM | POA: Diagnosis not present

## 2019-05-14 DIAGNOSIS — I5032 Chronic diastolic (congestive) heart failure: Secondary | ICD-10-CM

## 2019-05-14 DIAGNOSIS — I10 Essential (primary) hypertension: Secondary | ICD-10-CM

## 2019-05-14 NOTE — Telephone Encounter (Signed)
Appointment scheduled 05/15/19 at 10:15 am with Chevis Pretty.

## 2019-05-14 NOTE — Patient Instructions (Signed)
Visit Information  Goals Addressed            This Visit's Progress     Patient Stated   . "I want to manage my heart failure" (pt-stated)       Current Barriers:  . Chronic Disease Management support and education needs related to chronic diastolic congestive heart failure  Nurse Case Manager Clinical Goal(s):  [name] Over the next 30 days, the patient will demonstrate ongoing self health care management ability as evidenced by no CHF exacerbations and no increase in weight above 270 as a result of fluid retention. * . Over the next 90 days, patient will follow-up with cardiologist as directed  Interventions:  . Evaluation of current treatment plan related to CHF and patient's adherence to plan as established by provider. . Previously advised patient to continue following a strict low sodium diet . Chart reviewed . Discussed plans with patient for ongoing care management follow up and provided patient with direct contact information for care management team . Advised patient, providing education and rationale, to weigh daily and record, calling  RNCM at [phone] or cardiologist at [phone] for weight gain of 3lbs overnight or 5 pounds in a week.  . Reviewed medications and discussed Lasix and spironolactone o Patient self-discontinued lasix 1 to 2 weeks ago when he developed "kidney pain". He also didn't think it was making a difference in blood pressure or fluid retention. He hasn't noticed any fluid retention. Did not realize spironolactone is a diuretic.  [name] Staff message sent to pharmacist at cardiology office, Tommy Medal to notify her that of the lasix discontinuation. He has seen Mr Ritacco recently for blood pressure management.  . Discussed morning weights. Denies any significant fluctuations.  . Reviewed oxygen use. Currently using portable O2 concentrator on 3L pulse during the day. He enjoys the freedom to move around with the portable unit. Patient is using 2L  continuous O2 with floor model concentrator. Reports that oxygen amount is sufficient for normal activities.   Patient Self Care Activities:  . Performs ADL's independently . Performs IADL's independently  Please see past updates related to this goal by clicking on the "Past Updates" button in the selected goal      . Diabetes Management (pt-stated)        Current Barriers:  [name] Knowledge deficits related to DM management . Financial constraints  Nurse Case Manager Clinical Goal(s):  [name] Over the next 30 days, patient will work with [facility] clinical pharmacist regarding diabetes management  Interventions:  . Chart reviewed . Talked with patient by telephone . Discussed home blood sugar testing o Testing 3+ times a day o 208 fasting this morning. This is higher than normal.  . Reviewed and discussed medications: Amaryl 75m TID o Patient takes 1/2 dose if "blood sugar is low" o [facility] I am concerned about the unusually high Amaryl dose, especially since his creatinine is elevated o Appt scheduled with JAlmyra Freefor 05/16/19 at 3:30. Patient aware.  . Encouraged patient to continue checking blood sugar as directed and to report any readings outside of recommended range . Previously provided with RN Care Manager contact information and encouraged to reach out as needed  Patient Self Care Activities:  . Performs ADL's independently . Performs IADL's independently  Please see past updates related to this goal by clicking on the "Past Updates" button in the selected goal      . Hypertension Management (pt-stated)  Current Barriers:  [name] Knowledge Deficits related to acute on chronic elevation of blood pressure . Chronic Disease Management support and education needs related to Hypertension  Nurse Case Manager Clinical Goal(s):  [name] Over the next 14 days, patient will talk with RN CCM regarding blood pressure management . Over the next 30 days,  patient will take all medications as prescribed . Over the next 90 days, patient will keep all scheduled medical appointments  Interventions:  . Talked with patient by telephone today . Encouraged patient to check and record blood pressure twice daily . Advised patient to reach out to cardiology office if readings are outside of recommended range . Discussed home readings o Averaging around 147/93 o No recent readings with diastolic over 416 o Has not been given a target blood pressure range . Reviewed and discussed medications  o Patient d/c lasix because he thought it making his right kidney hurt o Stated that he didn't notice any difference in his blood pressure or fluid level when taking it . Staff message sent to clinical pharmacist, Tommy Medal, with Dr Croitoru's office advising that he has d/c lasix  . Patient has been provided with RN CCM contact information and advised to reach out as needed  Patient Self Care Activities:  . Performs ADL's independently . Performs IADL's independently  Please see past updates related to this goal by clicking on the "Past Updates" button in the selected goal      . COMPLETED: Prescription Assistance (pt-stated)       Prescription assistance needs in a patient with diabetes, asthma, HTN, and OSA.  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   Nurse Case Manager Clinical Goal(s):  [name] Over the next year, patient will reach out to RN CCM if he needs prescription assistance.   Interventions:  . Talked with patient about prescription assistance through [employer]. Reports that he has not heard from the appeal. . Discussed current treatment and patient stated that [place] came out for an evaluation last week and was able to change his albuterol to a nebulizer solution and that he no longer needs [employer] Rx assistance.   Patient Self Care Activities:  . Performs ADL's  independently . Performs IADL's independently . Unable to independently n/a  Please see past updates related to this goal by clicking on the "Past Updates" button in the selected goal   Medications were changed. No longer needs Rx assistance at this time. Patient will reach out if that changes.        Other   . "Kidney Pain"       CARE PLAN ENTRY (see longtitudinal plan of care for additional care plan information)  Right sided back/flank pain in patient with CKD and history of kidney stones  Current Barriers:  [name] Knowledge Deficits related to cause of back/flank pain  Nurse Case Manager Clinical Goal(s):  [name] Over the next 3 days, patient will see provider at PCP office regarding back/flank pain  Interventions:  . Talked with patient by telephone . Chart reviewed . Discussed HPI o Reports developed right sided back/flank pain about 2 weeks ago o Stopped taking lasix to see if that would help o Pain has not changed since onset o States that it does not feel like previous kidney stones . Collaborated with [facility] clinical staff to schedule appt with provider for 05/15/19. Patient is aware.  . Advised patient to reach out sooner with any new or worsening symptoms  and to seek emergency medical care if needed  Patient Self Care Activities:  . Performs ADL's independently . Performs IADL's independently  Initial goal documentation        The patient verbalized understanding of instructions provided today and declined a print copy of patient instruction materials.   Follow-up Plan The care management team will reach out to the patient again over the next 15 days.   [name], BSN, RN-BC Embedded Chronic Care Manager [facility] / [place] [facility] Direct Dial: [phone]

## 2019-05-14 NOTE — Telephone Encounter (Signed)
05/14/2019  I talked with patient by telephone today for a CCM follow-up. He complains of right back/flank pain for the past few weeks. Reports that it doesn't feel like prior kidney stones. He was concerned that it was due to his fluid pill, so he stopped taking that, but the pain has not improved.  I will forward to [facility] Triage nurse so that they can contact him and setup an appointment for evaluation this week.   [name], BSN, RN-BC Embedded Chronic Care Manager [facility] / [place] [facility] Direct Dial: [phone]

## 2019-05-14 NOTE — Chronic Care Management (AMB) (Signed)
Chronic Care Management   Follow Up Note   05/14/2019 Name: Kenneth Fuller MRN: [id number] DOB: [birth year]  Referred by: Fuller, Kenneth Kaufmann, MD Reason for referral : Chronic Care Management (RN follow up)   Kenneth Fuller is a 74 y.o. year old male who is a primary care patient of Fuller, Kenneth Kaufmann, MD. The CCM team was consulted for assistance with chronic disease management and care coordination needs.    Review of patient status, including review of consultants reports, relevant laboratory and other test results, and collaboration with appropriate care team members and the patient's provider was performed as part of comprehensive patient evaluation and provision of chronic care management services.    SDOH (Social Determinants of Health) assessments performed: No See Care Plan activities for detailed interventions related to [facility])     Outpatient Encounter Medications as of 05/14/2019  Medication Sig Note  . spironolactone (ALDACTONE) 25 MG tablet Take 1 tablet (25 mg total) by mouth daily.   [name] albuterol (ACCUNEB) 1.25 MG/3ML nebulizer solution Take 1 ampule by nebulization every 6 (six) hours as needed for wheezing.   [name] albuterol (PROVENTIL HFA;VENTOLIN HFA) 108 (90 Base) MCG/ACT inhaler Inhale 2 puffs into the lungs every 6 (six) hours as needed for wheezing or shortness of breath.   [name] amitriptyline (ELAVIL) 25 MG tablet 1/2 pill each bedtime x 1 week, then 1 pill nightly x 1 week, then 1 1/2 pills nightly x 1 week, then 2 pills nightly thereafter.   [name] atorvastatin (LIPITOR) 40 MG tablet Take 1 tablet by mouth once daily   . bisoprolol (ZEBETA) 5 MG tablet Take 0.5 tablets (2.5 mg total) by mouth daily.   . Blood Pressure Monitoring (BLOOD PRESSURE CUFF) MISC 1 each by Does not apply route daily.   . budesonide-formoterol (SYMBICORT) 160-4.5 MCG/ACT inhaler Inhale 2 puffs into the lungs 2 (two) times daily.   . Cholecalciferol (VITAMIN D PO) Take 5,000 Units by mouth every  morning.    . clonazePAM (KLONOPIN) 0.5 MG tablet Take 1 tablet (0.5 mg total) by mouth at bedtime as needed.   . furosemide (LASIX) 40 MG tablet Take 80 mg (two tablets) twice daily until your weight gets down to 275 lbs then decrease it back to 40 mg twice daily. (Patient not taking: Reported on 05/14/2019) 05/14/2019: Pt stopped taking on his own at the end of March 2021  . glimepiride (AMARYL) 4 MG tablet Take 1 tablet (4 mg total) by mouth 3 (three) times daily. 05/14/2019: Will sometimes take 1/2 dose if blood sugar is "too low"  . glucose blood test strip Test BS daily Dx E11.9   . hydrALAZINE (APRESOLINE) 25 MG tablet Take 1-2 tablets (25-50 mg total) by mouth 3 (three) times daily. (Patient taking differently: Take 50 mg by mouth 3 (three) times daily. )   . hydroxychloroquine (PLAQUENIL) 200 MG tablet Take 2 tablets (400 mg total) by mouth daily.   . isosorbide dinitrate (ISORDIL) 30 MG tablet Take 1 tablet (30 mg total) by mouth 3 (three) times daily.   . Lancets (ACCU-CHEK SOFT TOUCH) lancets 1 each by Other route daily. Use as instructed   . lisinopril (ZESTRIL) 20 MG tablet Take 2 tablets (40 mg total) by mouth daily.   . Misc. Devices (PULSE OXIMETER) MISC 1 each by Does not apply route continuous as needed.   . mometasone-formoterol (DULERA) 100-5 MCG/ACT AERO Inhale 2 puffs into the lungs 2 (two) times daily.   [name] DELICA LANCETS  33G MISC 1 each by Does not apply route daily. Test 1X per day and prn   . OXYGEN Inhale 2 L into the lungs at bedtime. 2lpm with sleep    . Potassium Chloride ER 20 MEQ TBCR Take 1 tablet by mouth daily.   . rizatriptan (MAXALT) 5 MG tablet Take 1 tablet (5 mg total) by mouth as needed for migraine. May repeat in 2 hours if needed   . sertraline (ZOLOFT) 50 MG tablet Take 1 tablet (50 mg total) by mouth daily.   . traMADol (ULTRAM) 50 MG tablet Take 1 tablet (50 mg total) by mouth every 6 (six) hours as needed.    No facility-administered encounter  medications on file as of 05/14/2019.    BP Readings from Last 3 Encounters:  04/17/19 128/72  04/11/19 (!) 164/104  04/04/19 (!) 144/81   Lab Results  Component Value Date   CREATININE 1.39 (H) 04/17/2019   BUN 23 04/17/2019   NA 141 04/17/2019   K 4.4 04/17/2019   CL 98 04/17/2019   CO2 28 04/17/2019    RN Care Plan   . "I want to manage my heart failure"        Current Barriers:  . Chronic Disease Management support and education needs related to chronic diastolic congestive heart failure  Nurse Case Manager Clinical Goal(s):  [name] Over the next 30 days, the patient will demonstrate ongoing self health care management ability as evidenced by no CHF exacerbations and no increase in weight above 270 as a result of fluid retention. * . Over the next 90 days, patient will follow-up with cardiologist as directed  Interventions:  . Evaluation of current treatment plan related to CHF and patient's adherence to plan as established by provider. . Previously advised patient to continue following a strict low sodium diet . Chart reviewed . Discussed plans with patient for ongoing care management follow up and provided patient with direct contact information for care management team . Advised patient, providing education and rationale, to weigh daily and record, calling  RNCM at [phone] or cardiologist at [phone] for weight gain of 3lbs overnight or 5 pounds in a week.  . Reviewed medications and discussed Lasix and spironolactone o Patient self-discontinued lasix 1 to 2 weeks ago when he developed "kidney pain". He also didn't think it was making a difference in blood pressure or fluid retention. He hasn't noticed any fluid retention. Did not realize spironolactone is a diuretic.  [name] Staff message sent to pharmacist at cardiology office, Tommy Medal to notify her that of the lasix discontinuation. He has seen [name] recently for blood pressure management.  . Discussed morning  weights. Denies any significant fluctuations.  . Reviewed oxygen use. Currently using portable O2 concentrator on 3L pulse during the day. He enjoys the freedom to move around with the portable unit. Patient is using 2L continuous O2 with floor model concentrator. Reports that oxygen amount is sufficient for normal activities.   Patient Self Care Activities:  . Performs ADL's independently . Performs IADL's independently  Please see past updates related to this goal by clicking on the "Past Updates" button in the selected goal      . Diabetes Management        Current Barriers:  [name] Knowledge deficits related to DM management . Financial constraints  Nurse Case Manager Clinical Goal(s):  [name] Over the next 30 days, patient will work with [facility] clinical pharmacist regarding diabetes management  Interventions:  .  Chart reviewed . Talked with patient by telephone . Discussed home blood sugar testing o Testing 3+ times a day o 208 fasting this morning. This is higher than normal.  . Reviewed and discussed medications: Amaryl 26m TID o Patient takes 1/2 dose if "blood sugar is low" o [facility] am concerned about the unusually high Amaryl dose, especially since his creatinine is elevated o Appt scheduled with JAlmyra Freefor 05/16/19 at 3:30. Patient aware.  . Encouraged patient to continue checking blood sugar as directed and to report any readings outside of recommended range . Previously provided with RN Care Manager contact information and encouraged to reach out as needed  Patient Self Care Activities:  . Performs ADL's independently . Performs IADL's independently  Please see past updates related to this goal by clicking on the "Past Updates" button in the selected goal      . Hypertension Management       Current Barriers:  .Marland KitchenKnowledge Deficits related to acute on chronic elevation of blood pressure . Chronic Disease Management support and  education needs related to Hypertension  Nurse Case Manager Clinical Goal(s):  .Marland KitchenOver the next 14 days, patient will talk with RN CCM regarding blood pressure management . Over the next 30 days, patient will take all medications as prescribed . Over the next 90 days, patient will keep all scheduled medical appointments  Interventions:  . Talked with patient by telephone today . Encouraged patient to check and record blood pressure twice daily . Advised patient to reach out to cardiology office if readings are outside of recommended range . Discussed home readings o Averaging around 147/93 o No recent readings with diastolic over 1881o Has not been given a target blood pressure range . Reviewed and discussed medications  o Patient d/c lasix because he thought it making his right kidney hurt o Stated that he didn't notice any difference in his blood pressure or fluid level when taking it . Staff message sent to clinical pharmacist, KTommy Medal with Dr Croitoru's office advising that he has d/c lasix  . Patient has been provided with RN CCM contact information and advised to reach out as needed  Patient Self Care Activities:  . Performs ADL's independently . Performs IADL's independently  Please see past updates related to this goal by clicking on the "Past Updates" button in the selected goal      . "Kidney Pain"       CEcho(see longtitudinal plan of care for additional care plan information)  Right sided back/flank pain in patient with CKD and history of kidney stones  Current Barriers:  .Marland KitchenKnowledge Deficits related to cause of back/flank pain  Nurse Case Manager Clinical Goal(s):  .Marland KitchenOver the next 3 days, patient will see provider at PCP office regarding back/flank pain  Interventions:  . Talked with patient by telephone . Chart reviewed . Discussed HPI o Reports developed right sided back/flank pain about 2 weeks ago o Stopped taking lasix to see if that  would help o Pain has not changed since onset o States that it does not feel like previous kidney stones . Collaborated with [facility] clinical staff to schedule appt with provider for 05/15/19. Patient is aware.  . Advised patient to reach out sooner with any new or worsening symptoms and to seek emergency medical care if needed  Patient Self Care Activities:  . Performs ADL's independently . Performs IADL's independently  Initial goal documentation         Follow-up Plan: The care management team will reach out to the patient again over the next 15 days.  Patient will see Chevis Pretty, FNP on 05/15/19 Patient will see [name], Clinical Pharmacist on 05/16/19  [name], BSN, RN-BC [place] / [place] Management Direct Dial: [phone]

## 2019-05-15 ENCOUNTER — Other Ambulatory Visit: Payer: Self-pay

## 2019-05-15 ENCOUNTER — Telehealth: Payer: Self-pay | Admitting: Pharmacist Clinician (PhC)/ Clinical Pharmacy Specialist

## 2019-05-15 ENCOUNTER — Ambulatory Visit (INDEPENDENT_AMBULATORY_CARE_PROVIDER_SITE_OTHER): Payer: PPO | Admitting: Nurse Practitioner

## 2019-05-15 ENCOUNTER — Encounter: Payer: Self-pay | Admitting: Nurse Practitioner

## 2019-05-15 ENCOUNTER — Ambulatory Visit (INDEPENDENT_AMBULATORY_CARE_PROVIDER_SITE_OTHER): Payer: PPO

## 2019-05-15 VITALS — BP 116/71 | HR 76 | Temp 98.6°F | Resp 20 | Ht 67.0 in | Wt 282.0 lb

## 2019-05-15 DIAGNOSIS — R10A1 Flank pain, right side: Secondary | ICD-10-CM

## 2019-05-15 DIAGNOSIS — K5909 Other constipation: Secondary | ICD-10-CM | POA: Diagnosis not present

## 2019-05-15 DIAGNOSIS — R109 Unspecified abdominal pain: Secondary | ICD-10-CM | POA: Diagnosis not present

## 2019-05-15 LAB — URINALYSIS, COMPLETE
Bilirubin, UA: NEGATIVE
Glucose, UA: NEGATIVE
Ketones, UA: NEGATIVE
Leukocytes,UA: NEGATIVE
Nitrite, UA: NEGATIVE
Protein,UA: NEGATIVE
RBC, UA: NEGATIVE
Specific Gravity, UA: 1.025 (ref 1.005–1.030)
Urobilinogen, Ur: 0.2 mg/dL (ref 0.2–1.0)
pH, UA: 5.5 (ref 5.0–7.5)

## 2019-05-15 LAB — MICROSCOPIC EXAMINATION
Bacteria, UA: NONE SEEN
Epithelial Cells (non renal): NONE SEEN /hpf (ref 0–10)
RBC, Urine: NONE SEEN /hpf (ref 0–2)
Renal Epithel, UA: NONE SEEN /hpf
WBC, UA: NONE SEEN /hpf (ref 0–5)

## 2019-05-15 MED ORDER — TAMSULOSIN HCL 0.4 MG PO CAPS: 0.4000 mg | ORAL_CAPSULE | Freq: Every day | ORAL | 3 refills | Status: DC

## 2019-05-15 MED ORDER — KETOROLAC TROMETHAMINE 60 MG/2ML IM SOLN: 60.0000 mg | Freq: Once | INTRAMUSCULAR | Status: DC

## 2019-05-15 MED ORDER — HYDROCODONE-ACETAMINOPHEN 5-325 MG PO TABS: 1.0000 | ORAL_TABLET | Freq: Four times a day (QID) | ORAL | 0 refills | Status: DC | PRN

## 2019-05-15 MED ORDER — KETOROLAC TROMETHAMINE 60 MG/2ML IM SOLN
60.0000 mg | Freq: Once | INTRAMUSCULAR | Status: AC
  Administered 2019-05-15: 60 mg via INTRAMUSCULAR

## 2019-05-15 NOTE — Telephone Encounter (Signed)
Received a message from RN at [facility] regarding patient.  He is being treated there for flank pain.  He notes that when the pain started, he stopped taking his furosemide.  Has not had any for about 2 weeks.   Called patient to check on his fluid status.  Furosemide rx states to take 80 mg bid until weight down to 275 then decrease to 40 mg bid.  Today he states that his weight is steady in the 275-280 range without any furosemide.  Advised that he monitor weights daily and should he see an increase of 3 lb/day or 5/wk he would need to start back on the furosemide until weight stable again.  Patient thanked me for information.

## 2019-05-15 NOTE — Patient Instructions (Addendum)
Constipation, Adult Constipation is when a person:  Poops (has a bowel movement) fewer times in a week than normal.  Has a hard time pooping.  Has poop that is dry, hard, or bigger than normal. Follow these instructions at home: Eating and drinking   Eat foods that have a lot of fiber, such as: ? Fresh fruits and vegetables. ? Whole grains. ? Beans.  Eat less of foods that are high in fat, low in fiber, or overly processed, such as: ? [place] fries. ? Hamburgers. ? Cookies. ? Candy. ? Soda.  Drink enough fluid to keep your pee (urine) clear or pale yellow. General instructions  Exercise regularly or as told by your doctor.  Go to the restroom when you feel like you need to poop. Do not hold it in.  Take over-the-counter and prescription medicines only as told by your doctor. These include any fiber supplements.  Do pelvic floor retraining exercises, such as: ? Doing deep breathing while relaxing your lower belly (abdomen). ? Relaxing your pelvic floor while pooping.  Watch your condition for any changes.  Keep all follow-up visits as told by your doctor. This is important. Contact a doctor if:  You have pain that gets worse.  You have a fever.  You have not pooped for 4 days.  You throw up (vomit).  You are not hungry.  You lose weight.  You are bleeding from the anus.  You have thin, pencil-like poop (stool). Get help right away if:  You have a fever, and your symptoms suddenly get worse.  You leak poop or have blood in your poop.  Your belly feels hard or bigger than normal (is bloated).  You have very bad belly pain.  You feel dizzy or you faint. This information is not intended to replace advice given to you by your health care provider. Make sure you discuss any questions you have with your health care provider. Document Revised: 01/07/2017 Document Reviewed: 07/16/2015 Elsevier Patient Education  2020 [place].  Kidney Stones Kidney  stones are rock-like masses that form inside of the kidneys. Kidneys are organs that make pee (urine). A kidney stone may move into other parts of the urinary tract, including:  The tubes that connect the kidneys to the bladder (ureters).  The bladder.  The tube that carries urine out of the body (urethra). Kidney stones can cause very bad pain and can block the flow of pee. The stone usually leaves your body (passes) through your pee. You may need to have a doctor take out the stone. What are the causes? Kidney stones may be caused by:  A condition in which certain glands make too much parathyroid hormone (primary hyperparathyroidism).  A buildup of a type of crystals in the bladder made of a chemical called uric acid. The body makes uric acid when you eat certain foods.  Narrowing (stricture) of one or both of the ureters.  A kidney blockage that you were born with.  Past surgery on the kidney or the ureters, such as gastric bypass surgery. What increases the risk? You are more likely to develop this condition if:  You have had a kidney stone in the past.  You have a family history of kidney stones.  You do not drink enough water.  You eat a diet that is high in protein, salt (sodium), or sugar.  You are overweight or very overweight (obese). What are the signs or symptoms? Symptoms of a kidney stone may include:  Pain in the side of the belly, right below the ribs (flank pain). Pain usually spreads (radiates) to the groin.  Needing to pee often or right away (urgently).  Pain when going pee (urinating).  Blood in your pee (hematuria).  Feeling like you may vomit (nauseous).  Vomiting.  Fever and chills. How is this treated? Treatment depends on the size, location, and makeup of the kidney stones. The stones will often pass out of the body through peeing. You may need to:  Drink more fluid to help pass the stone. In some cases, you may be given fluids through an  IV tube put into one of your veins at the hospital.  Take medicine for pain.  Make changes in your diet to help keep kidney stones from coming back. Sometimes, medical procedures are needed to remove a kidney stone. This may involve:  A procedure to break up kidney stones using a beam of light (laser) or shock waves.  Surgery to remove the kidney stones. Follow these instructions at home: Medicines  Take over-the-counter and prescription medicines only as told by your doctor.  Ask your doctor if the medicine prescribed to you requires you to avoid driving or using heavy machinery. Eating and drinking  Drink enough fluid to keep your pee pale yellow. You may be told to drink at least 8-10 glasses of water each day. This will help you pass the stone.  If told by your doctor, change your diet. This may include: ? Limiting how much salt you eat. ? Eating more fruits and vegetables. ? Limiting how much meat, poultry, fish, and eggs you eat.  Follow instructions from your doctor about eating or drinking restrictions. General instructions  Collect pee samples as told by your doctor. You may need to collect a pee sample: ? 24 hours after a stone comes out. ? 8-12 weeks after a stone comes out, and every 6-12 months after that.  Strain your pee every time you pee (urinate), for as long as told. Use the strainer that your doctor recommends.  Do not throw out the stone. Keep it so that it can be tested by your doctor.  Keep all follow-up visits as told by your doctor. This is important. You may need follow-up tests. How is this prevented? To prevent another kidney stone:  Drink enough fluid to keep your pee pale yellow. This is the best way to prevent kidney stones.  Eat healthy foods.  Avoid certain foods as told by your doctor. You may be told to eat less protein.  Stay at a healthy weight. Where to find more information  [place]): www.kidney.[place] [facility]): www.urologyhealth.org Contact a doctor if:  You have pain that gets worse or does not get better with medicine. Get help right away if:  You have a fever or chills.  You get very bad pain.  You get new pain in your belly (abdomen).  You pass out (faint).  You cannot pee. Summary  Kidney stones are rock-like masses that form inside of the kidneys.  Kidney stones can cause very bad pain and can block the flow of pee.  The stones will often pass out of the body through peeing.  Drink enough fluid to keep your pee pale yellow. This information is not intended to replace advice given to you by your health care provider. Make sure you discuss any questions you have with your health care provider. Document Revised: 06/13/2018 Document Reviewed: 06/13/2018  Elsevier Patient Education  [employer].

## 2019-05-15 NOTE — Progress Notes (Signed)
Subjective:    Patient ID: Kenneth Fuller, male    DOB: [birth year], 74 y.o.   MRN: [id number]   Chief Complaint: Flank Pain (Right)   HPI Pt is having pain in his right flank area. Onset was about a month ago and has been getting worse. Pain comes and goes. Aggravated with movement. Pain is sharp and stabbing. Rates pain at 8/10. Sitting on left hip helps alleviate pain and he is able to get comfortable when lying down. When pain first started, 800 mg motrin would help the pain but now it does not. Denies problems with urination.Has had kidney stones in the past but does not think this is what is wrong because the pain is not constant. Has not taken his lasix for the last 2 weeks but it has not made any difference.    Review of Systems  Constitutional: Negative.   HENT: Negative.   Eyes: Negative.   Respiratory: Negative.   Cardiovascular: Negative.   Endocrine: Negative.   Genitourinary: Positive for flank pain (right).  Neurological: Negative.        Objective:   Physical Exam Vitals and nursing note reviewed.  Constitutional:      Appearance: Normal appearance.  HENT:     Head: Normocephalic.  Cardiovascular:     Rate and Rhythm: Normal rate and regular rhythm.     Pulses: Normal pulses.     Heart sounds: Normal heart sounds.  Pulmonary:     Effort: Pulmonary effort is normal.  Abdominal:     General: Bowel sounds are normal.     Palpations: Abdomen is soft.  Musculoskeletal:        General: Tenderness (right flank) present.  Skin:    General: Skin is warm and dry.  Neurological:     Mental Status: He is alert and oriented to person, place, and time.  Psychiatric:        Behavior: Behavior normal.    BP 116/71   Pulse 76   Temp 98.6 F (37 C) (Temporal)   Resp 20   Ht _0  (1.702 m)   Wt 282 lb (127.9 kg)   SpO2 93%   BMI 44.17 kg/m   Urinalysis is clear  KUB- moderate to gross amounts of stool in colon.-Preliminary reading by Ronnald Collum, FNP   [facility]       Assessment & Plan:  [name] in today with chief complaint of Flank Pain (Right)   1. Right flank pain Urinalysis negative toradol injection flomax 0.14m daily Force fluids - DG Abd 1 View; Future - Urinalysis, Complete  2. Other constipation No kidney stone visible unless hidden by stool. Milk of Magnesia and prune juice Increase fiber in diet If pain is not improving will ned to do CT scan.  Meds ordered this encounter  Medications  . DISCONTD: ketorolac (TORADOL) injection 60 mg  . tamsulosin (FLOMAX) 0.4 MG CAPS capsule    Sig: Take 1 capsule (0.4 mg total) by mouth daily.    Dispense:  30 capsule    Refill:  3    Order Specific Question:   Supervising Provider    Answer:   DCaryl PinaA [[id number] . HYDROcodone-acetaminophen (NORCO/VICODIN) 5-325 MG tablet    Sig: Take 1 tablet by mouth every 6 (six) hours as needed for moderate pain.    Dispense:  20 tablet    Refill:  0    Order Specific Question:   Supervising Provider    Answer:  [name], JOSHUA A [[id number]]  . ketorolac (TORADOL) injection 60 mg      The above assessment and management plan was discussed with the patient. The patient verbalized understanding of and has agreed to the management plan. Patient is aware to call the clinic if symptoms persist or worsen. Patient is aware when to return to the clinic for a follow-up visit. Patient educated on when it is appropriate to go to the emergency department.   Mary-Margaret Hassell Done, FNP

## 2019-05-16 ENCOUNTER — Ambulatory Visit (INDEPENDENT_AMBULATORY_CARE_PROVIDER_SITE_OTHER): Payer: PPO | Admitting: Pharmacist

## 2019-05-16 ENCOUNTER — Other Ambulatory Visit: Payer: Self-pay

## 2019-05-16 DIAGNOSIS — E119 Type 2 diabetes mellitus without complications: Secondary | ICD-10-CM | POA: Diagnosis not present

## 2019-05-16 NOTE — Progress Notes (Signed)
S:     Chief Complaint  Patient presents with  . Diabetes  . Medication Management    80 yoM presents to clinic for diabetes evaluation, education, and management.  PMH significant for: asthma, CAD, CHF, DM, FM, GERD, HLD, HTN, OSA.  Patient was referred on 05/15/19 by [facility] RN CM, [name].  Patient was last seen by Primary Care Provider on 04/11/19.   Family/Social History: Family Hx: DM, HTN, CHF in father, died at 58 from MI; mother had hx of cancer, died at 47; 72 sisters, 5 bothers - 5 deceased - 1 from CHF, 2 others probable; 3 children 1 son with hypertension at 45  Insurance coverage/medication affordability: [employer] Medicare-->fixed income, brand name copays difficult   Patient MOSTLY adherent with medications.  Current diabetes medications include: glimepiride 55m TID  Current hypertension medications include: bisoprolol 2.534mdaily, lisinopril 40109maily, hydralazine 29m62mD, spironolactone 25mg40mly, isosorbide 30mg 90m Current hyperlipidemia medications include: atorvastatin 40mg q3mPatient reports hypoglycemic events.  These events are occurring once weekly on 3x daily glimepiride.   Patient reported dietary habits: Eats 3 meals/day.   Breakfast: slim fast shakes vs eggs/bacone/bread, coffee  Lunch: slim fast shakes vs burger/sandwiches/carb side  Dinner: incorporates meat, carb, veggie  Snacks: n/a  Drinks: coffee, tea, water--counseled to avoid sugary drinks, etc  Patient-reported exercise habits: n/a--encouraged exercise/walking now that weather is pleasant  Patient reports nocturia (nighttime urination)--influenced by newly started Flomax, counseled patient to take in the AM, avoid night time excessive drinking, works to limit fluid intake due to HF.  Patient reports neuropathy (nerve pain).  Patient reports visual changes.  Patient denies self foot exams.   O:  Physical Exam  Lower leg edema at baseline, denies  SOB/wheezing  Lab Results  Component Value Date   HGBA1C 8.0 (H) 02/15/2019    Lipid Panel     Component Value Date/Time   CHOL 81 (L) 02/15/2019 1403   CHOL 111 06/12/2012 1232   TRIG 142 02/15/2019 1403   TRIG 141 01/24/2014 1143   TRIG 192 (H) 06/12/2012 1232   HDL 38 (L) 02/15/2019 1403   HDL 38 (L) 01/24/2014 1143   HDL 33 (L) 06/12/2012 1232   CHOLHDL 2.1 02/15/2019 1403   CHOLHDL 4.8 11/22/2006 1712   VLDL 39 11/22/2006 1712   LDLCALC 19 02/15/2019 1403   LDLCALC 27 10/25/2013 1224   LDLCALC 40 06/12/2012 1232     Home fasting blood sugars: 150-200s  2 hour post-meal/random blood sugars: 180-200s.   The ASCVD Risk score (Goff DMikey Bussing, et al., 2013) failed to calculate for the following reasons:   The valid total cholesterol range is 130 to 320 mg/dL  A/P:  T2DM longstanding. Patient is able to verbalize appropriate hypoglycemia management plan. Patient adherent with medication MOST of the time.  He has periods of time where he is not keen on taking medication and stops.  Control is suboptimal due to diet/lifestyle.  He states the holidays have been difficult to manage his BG due to his eating choices.  -Discontinued glimepiride  -Gave samples of SGLT2-I Farxiga (generic named dapagliflozin) to patient .  LOT#[id number].  Patient assistance application filled out with patient in clinic.  Will provide PCP portion.  Patient instructed to bring in financials  -Extensively discussed pathophysiology of diabetes, recommended lifestyle interventions, dietary effects on blood sugar control  -Counseled on s/sx of and management of hypoglycemia  -Next  A1C anticipated next PCP visit  -ASCVD risk - primary prevention in patient with diabetes. Last LDL is controlled.  High intensity statin indicated- CHF, DM, HTN, age.   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  Reviewed recent adjustments to cardiac medications with patient.   Written patient  instructions provided.  Total time in face to face counseling 30 minutes.   Follow up PCP Clinic Visit in on 05/28/19.  PharmD will visit at that time     Regina Eck, PharmD, BCPS Clinical Pharmacist, [place]  [facility] Phone [phone]

## 2019-05-17 ENCOUNTER — Telehealth: Payer: Self-pay | Admitting: Pharmacist

## 2019-05-17 NOTE — Telephone Encounter (Signed)
   05/17/2019 Name: Kenneth Fuller MRN: [id number] DOB: 1946-01-15  Successful call to patient regarding new diabetes medication [name]). He has discontinued glimepiride.  His FBG was "better" this AM at 160.  Tolerating medication at this time.  Encouraged patient to call if needs arise; stay hydrated.  Counseled on s/sx of and management of hypoglycemia.  Encouraged patient to bring in PAP paperwork.   PLAN: Will f/u within 5-month  JRegina Eck PharmD, BCPS Clinical Pharmacist, [place] Phone [phone]

## 2019-05-18 DIAGNOSIS — R0902 Hypoxemia: Secondary | ICD-10-CM | POA: Diagnosis not present

## 2019-05-18 DIAGNOSIS — R0600 Dyspnea, unspecified: Secondary | ICD-10-CM | POA: Diagnosis not present

## 2019-05-21 ENCOUNTER — Ambulatory Visit: Payer: PPO | Admitting: *Deleted

## 2019-05-21 DIAGNOSIS — K5909 Other constipation: Secondary | ICD-10-CM

## 2019-05-21 DIAGNOSIS — F418 Other specified anxiety disorders: Secondary | ICD-10-CM

## 2019-05-21 DIAGNOSIS — I10 Essential (primary) hypertension: Secondary | ICD-10-CM

## 2019-05-21 DIAGNOSIS — E119 Type 2 diabetes mellitus without complications: Secondary | ICD-10-CM

## 2019-05-21 DIAGNOSIS — N183 Chronic kidney disease, stage 3 unspecified: Secondary | ICD-10-CM

## 2019-05-21 DIAGNOSIS — I5042 Chronic combined systolic (congestive) and diastolic (congestive) heart failure: Secondary | ICD-10-CM

## 2019-05-21 DIAGNOSIS — I5032 Chronic diastolic (congestive) heart failure: Secondary | ICD-10-CM | POA: Diagnosis not present

## 2019-05-21 DIAGNOSIS — I2510 Atherosclerotic heart disease of native coronary artery without angina pectoris: Secondary | ICD-10-CM

## 2019-05-21 DIAGNOSIS — J441 Chronic obstructive pulmonary disease with (acute) exacerbation: Secondary | ICD-10-CM | POA: Diagnosis not present

## 2019-05-21 NOTE — Chronic Care Management (AMB) (Signed)
Chronic Care Management   Follow Up Note   05/21/2019 Name: Kenneth Fuller MRN: [id number] DOB: Jul 01, 1945  Referred by: [name], [name], MD Reason for referral : Chronic Care Management (RN Follow up)   Kenneth Fuller is a 74 y.o. year old male who is a primary care patient of [name], [name], MD. The CCM team was consulted for assistance with chronic disease management and care coordination needs.    Review of patient status, including review of consultants reports, relevant laboratory and other test results, and collaboration with appropriate care team members and the patient's provider was performed as part of comprehensive patient evaluation and provision of chronic care management services.    I talked with [name] by telephone today regarding his blood pressure and diabetes management as well as constipation treatment.   SDOH (Social Determinants of Health) assessments performed: No See Care Plan activities for detailed interventions related to [facility])     Outpatient Encounter Medications as of 05/21/2019  Medication Sig Note  . albuterol (ACCUNEB) 1.25 MG/3ML nebulizer solution Take 1 ampule by nebulization every 6 (six) hours as needed for wheezing.   [name] albuterol (PROVENTIL HFA;VENTOLIN HFA) 108 (90 Base) MCG/ACT inhaler Inhale 2 puffs into the lungs every 6 (six) hours as needed for wheezing or shortness of breath.   [name] amitriptyline (ELAVIL) 25 MG tablet 1/2 pill each bedtime x 1 week, then 1 pill nightly x 1 week, then 1 1/2 pills nightly x 1 week, then 2 pills nightly thereafter.   [name] atorvastatin (LIPITOR) 40 MG tablet Take 1 tablet by mouth once daily   . bisoprolol (ZEBETA) 5 MG tablet Take 0.5 tablets (2.5 mg total) by mouth daily.   . Blood Pressure Monitoring (BLOOD PRESSURE CUFF) MISC 1 each by Does not apply route daily.   . Cholecalciferol (VITAMIN D PO) Take 5,000 Units by mouth every morning.    . clonazePAM (KLONOPIN) 0.5 MG tablet Take 1 tablet  (0.5 mg total) by mouth at bedtime as needed. (Patient not taking: Reported on 05/17/2019) 05/17/2019: Hasn't refilled  . dapagliflozin propanediol (FARXIGA) 10 MG TABS tablet Take 10 mg by mouth daily. 05/17/2019: Samples given-applying for patient assistance  . furosemide (LASIX) 40 MG tablet Take 80 mg (two tablets) twice daily until your weight gets down to 275 lbs then decrease it back to 40 mg twice daily. 05/14/2019: Pt stopped taking on his own at the end of March 2021  . glucose blood test strip Test BS daily Dx E11.9   . hydrALAZINE (APRESOLINE) 25 MG tablet Take 1-2 tablets (25-50 mg total) by mouth 3 (three) times daily. (Patient taking differently: Take 50 mg by mouth 3 (three) times daily. )   . HYDROcodone-acetaminophen (NORCO/VICODIN) 5-325 MG tablet Take 1 tablet by mouth every 6 (six) hours as needed for moderate pain.   . isosorbide dinitrate (ISORDIL) 30 MG tablet Take 1 tablet (30 mg total) by mouth 3 (three) times daily.   . Lancets (ACCU-CHEK SOFT TOUCH) lancets 1 each by Other route daily. Use as instructed   . lisinopril (ZESTRIL) 20 MG tablet Take 2 tablets (40 mg total) by mouth daily.   . Misc. Devices (PULSE OXIMETER) MISC 1 each by Does not apply route continuous as needed.   . mometasone-formoterol (DULERA) 100-5 MCG/ACT AERO Inhale 2 puffs into the lungs 2 (two) times daily. 05/17/2019: [employer] Patient assistance  . ONETOUCH DELICA LANCETS 38G MISC 1 each by Does not apply route daily. Test 1X per  day and prn   . OXYGEN Inhale 2 L into the lungs at bedtime. 2lpm with sleep    . Potassium Chloride ER 20 MEQ TBCR Take 1 tablet by mouth daily.   . rizatriptan (MAXALT) 5 MG tablet Take 1 tablet (5 mg total) by mouth as needed for migraine. May repeat in 2 hours if needed 05/17/2019: PRN-hasn't refilled  . sertraline (ZOLOFT) 50 MG tablet Take 1 tablet (50 mg total) by mouth daily. (Patient not taking: Reported on 05/17/2019) 05/17/2019: Needs refill  . spironolactone (ALDACTONE) 25 MG  tablet Take 1 tablet (25 mg total) by mouth daily.   . tamsulosin (FLOMAX) 0.4 MG CAPS capsule Take 1 capsule (0.4 mg total) by mouth daily.    No facility-administered encounter medications on file as of 05/21/2019.     RN Care Plan   . Diabetes Management (pt-stated)        Current Barriers:  [name] Knowledge deficits related to DM management . Financial constraints . CKD  Nurse Case Manager Clinical Goal(s):  [name] Over the next 30 days, patient will continue to work with [facility] clinical pharmacist regarding diabetes management  Interventions:  . Chart reviewed, including recent office notes and lab reports . Talked with patient by telephone . Discussed recent medication changes: d/c glimepiride and started [place] . Keep f/u appointment with PharmD . Discussed home blood sugar readings.  o Current non-fasting is 189 . Encouraged patient to continue checking blood sugar as directed and to report any readings outside of recommended range . Previously provided with RN Care Manager contact information and encouraged to reach out as needed  Patient Self Care Activities:  . Performs ADL's independently . Performs IADL's independently  Please see past updates related to this goal by clicking on the "Past Updates" button in the selected goal      . Hypertension Management (pt-stated)       Current Barriers:  [name] Knowledge Deficits related to acute on chronic elevation of blood pressure . Chronic Disease Management support and education needs related to Hypertension  Nurse Case Manager Clinical Goal(s):  [name] Over the next 14 days, patient will talk with RN CCM regarding blood pressure management . Over the next 30 days, patient will take all medications as prescribed . Over the next 90 days, patient will keep all scheduled medical appointments  Interventions:  . Talked with patient by telephone today . Encouraged patient to check and record blood pressure twice daily . Advised patient to  reach out to cardiology office if readings are outside of recommended range . Discussed home readings o Most recent readings: 143/83, 124/88, 123/79 . Reviewed and discussed medications  . Previously sent staff message sent to clinical pharmacist, Tommy Medal, with Dr Croitoru's office advising that he has d/c lasix  . Patient has been provided with RN CCM contact information and advised to reach out as needed  Patient Self Care Activities:  . Performs ADL's independently . Performs IADL's independently  Please see past updates related to this goal by clicking on the "Past Updates" button in the selected goal      . "Kidney Pain"       [place] (see longtitudinal plan of care for additional care plan information)  Right sided back/flank pain in patient with CKD and history of kidney stones  Current Barriers:  [name] Knowledge Deficits related to cause of back/flank pain  Nurse Case Manager Clinical Goal(s):  [name] Over the next 3 days, patient will see  provider at PCP office regarding back/flank pain  Interventions:  . Chart reviewed including recent office notes, imaging reports, and lab results . Talked with patient by telephone o Continues to have some flank/back pain but it has improved slightly o Has noticed that pain is worse with increased activity o Reviewed provider treatment recommendations . Encourage patient to reach out to PCP or RN CM with new or worsening symptoms  Patient Self Care Activities:  . Performs ADL's independently . Performs IADL's independently  Please see past updates related to this goal by clicking on the "Past Updates" button in the selected goal      . Constipation       CARE PLAN ENTRY (see longtitudinal plan of care for additional care plan information)  Current Barriers:  [name] Knowledge Deficits related to constipation management  Nurse Case Manager Clinical Goal(s):  [name] Over the next 7 days, patient will see improvement in  constipation symptoms . Over the next 30 days, patient will effectively manage constipation  Interventions:  . Inter-disciplinary care team collaboration (see longtitudinal plan of care) . Chart reviewed including recent office notes and imaging reports o Significant stool present on KUB . Discussed current treatment o Patient is using Metamucil in water and fiber gummy supplements . Encouraged increased water intake and increased activity level . Recommended that he d/c fiber supplements for now as they are causing increased gas/bloating and have not improved constipation . Recommended combination of glycerin suppositories and Miralax OTC o Discussed how these medications work in the body . Encouraged patient to call me or PCP with any new or worsening symptoms . Advised patient to f/u with me by telephone if constipation continues despite use of the medications  Patient Self Care Activities:  . Patient verbalizes understanding of plan to treat constipation . Performs ADL's independently . Performs IADL's independently  Initial goal documentation         Plan:   The care management team will reach out to the patient again over the next 7 days.    [name], BSN, RN-BC Embedded Chronic Care Manager [facility] [place] [facility] Direct Dial: [phone]

## 2019-05-21 NOTE — Patient Instructions (Signed)
Visit Information  Goals Addressed            This Visit's Progress     Patient Stated   . Diabetes Management (pt-stated)        Current Barriers:  [name] Knowledge deficits related to DM management . Financial constraints . CKD  Nurse Case Manager Clinical Goal(s):  [name] Over the next 30 days, patient will continue to work with [facility] clinical pharmacist regarding diabetes management  Interventions:  . Chart reviewed, including recent office notes and lab reports . Talked with patient by telephone . Discussed recent medication changes: d/c glimepiride and started [place] . Keep f/u appointment with PharmD . Discussed home blood sugar readings.  o Current non-fasting is 189 . Encouraged patient to continue checking blood sugar as directed and to report any readings outside of recommended range . Previously provided with RN Care Manager contact information and encouraged to reach out as needed  Patient Self Care Activities:  . Performs ADL's independently . Performs IADL's independently  Please see past updates related to this goal by clicking on the "Past Updates" button in the selected goal      . Hypertension Management (pt-stated)       Current Barriers:  [name] Knowledge Deficits related to acute on chronic elevation of blood pressure . Chronic Disease Management support and education needs related to Hypertension  Nurse Case Manager Clinical Goal(s):  [name] Over the next 14 days, patient will talk with RN CCM regarding blood pressure management . Over the next 30 days, patient will take all medications as prescribed . Over the next 90 days, patient will keep all scheduled medical appointments  Interventions:  . Talked with patient by telephone today . Encouraged patient to check and record blood pressure twice daily . Advised patient to reach out to cardiology office if readings are outside of recommended range . Discussed home readings o Most recent readings: 143/83, 124/88,  123/79 . Reviewed and discussed medications  . Previously sent staff message sent to clinical pharmacist, Tommy Medal, with Dr Croitoru's office advising that he has d/c lasix  . Patient has been provided with RN CCM contact information and advised to reach out as needed  Patient Self Care Activities:  . Performs ADL's independently . Performs IADL's independently  Please see past updates related to this goal by clicking on the "Past Updates" button in the selected goal        Other   . "Kidney Pain"       CARE PLAN ENTRY (see longtitudinal plan of care for additional care plan information)  Right sided back/flank pain in patient with CKD and history of kidney stones  Current Barriers:  [name] Knowledge Deficits related to cause of back/flank pain  Nurse Case Manager Clinical Goal(s):  [name] Over the next 3 days, patient will see provider at PCP office regarding back/flank pain  Interventions:  . Chart reviewed including recent office notes, imaging reports, and lab results . Talked with patient by telephone o Continues to have some flank/back pain but it has improved slightly o Has noticed that pain is worse with increased activity o Reviewed provider treatment recommendations . Encourage patient to reach out to PCP or RN CM with new or worsening symptoms  Patient Self Care Activities:  . Performs ADL's independently . Performs IADL's independently  Please see past updates related to this goal by clicking on the "Past Updates" button in the selected goal      . Constipation  CARE PLAN ENTRY (see longtitudinal plan of care for additional care plan information)  Current Barriers:  [name] Knowledge Deficits related to constipation management  Nurse Case Manager Clinical Goal(s):  [name] Over the next 7 days, patient will see improvement in constipation symptoms . Over the next 30 days, patient will effectively manage constipation  Interventions:  . Inter-disciplinary care  team collaboration (see longtitudinal plan of care) . Chart reviewed including recent office notes and imaging reports o Significant stool present on KUB . Discussed current treatment o Patient is using Metamucil in water and fiber gummy supplements . Encouraged increased water intake and increased activity level . Recommended that he d/c fiber supplements for now as they are causing increased gas/bloating and have not improved constipation . Recommended combination of glycerin suppositories and Miralax OTC o Discussed how these medications work in the body . Encouraged patient to call me or PCP with any new or worsening symptoms . Advised patient to f/u with me by telephone if constipation continues despite use of the medications  Patient Self Care Activities:  . Patient verbalizes understanding of plan to treat constipation . Performs ADL's independently . Performs IADL's independently  Initial goal documentation        The patient verbalized understanding of instructions provided today and declined a print copy of patient instruction materials.   Follow-up Plan The care management team will reach out to the patient again over the next 7 days.   [name], BSN, RN-BC Embedded Chronic Care Manager [facility] [place] [facility] Direct Dial: [phone]

## 2019-05-22 ENCOUNTER — Telehealth: Payer: Self-pay | Admitting: Family Medicine

## 2019-05-22 DIAGNOSIS — F339 Major depressive disorder, recurrent, unspecified: Secondary | ICD-10-CM

## 2019-05-22 MED ORDER — SERTRALINE HCL 50 MG PO TABS: 50.0000 mg | ORAL_TABLET | Freq: Every day | ORAL | 1 refills | Status: DC

## 2019-05-22 NOTE — Telephone Encounter (Signed)
-----  Message from [name], [facility] sent at 05/17/2019  4:38 PM EDT ----- Medication review performed with patient--he reports he does not have refills on:  -Sertraline (feels he needs to be on) -Clonazepam (didn't encourage unless you feel the patient needs)  All other meds up to date :)  Thanks! Almyra Free

## 2019-05-24 ENCOUNTER — Ambulatory Visit (INDEPENDENT_AMBULATORY_CARE_PROVIDER_SITE_OTHER): Payer: PPO | Admitting: Pharmacist

## 2019-05-24 DIAGNOSIS — E119 Type 2 diabetes mellitus without complications: Secondary | ICD-10-CM | POA: Diagnosis not present

## 2019-05-24 NOTE — Progress Notes (Signed)
PharmD Clinic Visit  05/24/2019 Name: Kenneth Fuller MRN: [id number] DOB: 09-Feb-1945  Referred by: [name], [name], MD Reason for referral : Diabetes   32 yoM presents to clinic for diabetes evaluation, education, and management.  PMH significant for: asthma, CAD, CHF, DM, FM, GERD, HLD, HTN, OSA.  Patient was referred on 05/15/19 by [facility] RN CM, [name].  Patient was last seen by Primary Care Provider on 04/11/19.   Family/Social History: Family Hx: DM, HTN, CHF in father, died at 70 from MI; mother had hx of cancer, died at 84; 42 sisters, 5 bothers - 5 deceased - 1 from CHF, 2 others probable; 3 children 1 son with hypertension at 84  Insurance coverage/medication affordability: [employer] Medicare-->fixed income, brand name copays difficult   Patient MOSTLY adherent with medications.  Current diabetes medications include: Farxiga 13m daily Current hypertension medications include: bisoprolol 2.574mdaily, lisinopril 4070maily, hydralazine 34m55mD, spironolactone 25mg47mly, isosorbide 30mg 75mCurrent hyperlipidemia medications include: atorvastatin 40mg q63mPatient reports hypoglycemic events.  These events are occurring once weekly on 3x daily glimepiride.   Patient reported dietary habits: Eats 3 meals/day.   Breakfast: slim fast shakes vs eggs/bacone/bread, coffee  Lunch: slim fast shakes vs burger/sandwiches/carb side  Dinner: incorporates meat, carb, veggie  Drinks: coffee, tea, water--counseled to avoid sugary drinks, etc  Patient-reported exercise habits: n/a--encouraged exercise/walking now that weather is pleasant  Patient reports nocturia (nighttime urination)--influenced by newly started Flomax, counseled patient to take in the AM, avoid night time excessive drinking, works to limit fluid intake due to HF.  Patient reports neuropathy (nerve pain).  Patient reports visual changes.  Patient denies self foot  exams.   O:  Physical Exam  Lower leg edema at baseline, denies SOB/wheezing  Recent Labs       Lab Results  Component Value Date   HGBA1C 8.0 (H) 02/15/2019      Lipid Panel  Labs (Brief)          Component Value Date/Time   CHOL 81 (L) 02/15/2019 1403   CHOL 111 06/12/2012 1232   TRIG 142 02/15/2019 1403   TRIG 141 01/24/2014 1143   TRIG 192 (H) 06/12/2012 1232   HDL 38 (L) 02/15/2019 1403   HDL 38 (L) 01/24/2014 1143   HDL 33 (L) 06/12/2012 1232   CHOLHDL 2.1 02/15/2019 1403   CHOLHDL 4.8 11/22/2006 1712   VLDL 39 11/22/2006 1712   LDLCALC 19 02/15/2019 1403   LDLCALC 27 10/25/2013 1224   LDLCALC 40 06/12/2012 1232       Home fasting blood sugars: 150-160s   A/P:  T2DM longstanding. Patient is able to verbalize appropriate hypoglycemia management plan. Patient adherent with medication MOST of the time.  He has periods of time where he is not keen on taking medication and stops.  Control is suboptimal due to diet/lifestyle.  He states the holidays have been difficult to manage his BG due to his eating choices.  -Continue Farxiga 10mg da8m  Gave additional samples of SGLT2-I Farxiga (generic named dapagliflozin) to patient while awaiting patient assistance.  LOT#[id number] #21 tabs.  Application to be submitted today to [employer] for Farxiga/Symbicort  FBG have improved to 150-160s  -Extensively discussed pathophysiology of diabetes, recommended lifestyle interventions, dietary effects on blood sugar control  -Counseled on s/sx of and management of hypoglycemia  -Next A1C anticipated next PCP visit  - [facility]  Blood pressure has improved   Written patient instructions provided.  Total time in face to face counseling 30 minutes.   Follow up with PharmD In 2 months   Regina Eck, PharmD, BCPS Clinical Pharmacist, [place]  [facility] Phone [phone]

## 2019-05-25 ENCOUNTER — Other Ambulatory Visit: Payer: Self-pay | Admitting: Family Medicine

## 2019-05-25 DIAGNOSIS — I10 Essential (primary) hypertension: Secondary | ICD-10-CM

## 2019-05-28 ENCOUNTER — Ambulatory Visit (INDEPENDENT_AMBULATORY_CARE_PROVIDER_SITE_OTHER): Payer: PPO | Admitting: Family Medicine

## 2019-05-28 ENCOUNTER — Encounter: Payer: Self-pay | Admitting: Family Medicine

## 2019-05-28 ENCOUNTER — Other Ambulatory Visit: Payer: Self-pay

## 2019-05-28 VITALS — BP 99/58 | HR 73 | Temp 97.5°F | Ht 67.0 in | Wt 278.2 lb

## 2019-05-28 DIAGNOSIS — I10 Essential (primary) hypertension: Secondary | ICD-10-CM

## 2019-05-28 DIAGNOSIS — E119 Type 2 diabetes mellitus without complications: Secondary | ICD-10-CM | POA: Diagnosis not present

## 2019-05-28 DIAGNOSIS — I5042 Chronic combined systolic (congestive) and diastolic (congestive) heart failure: Secondary | ICD-10-CM

## 2019-05-28 DIAGNOSIS — E785 Hyperlipidemia, unspecified: Secondary | ICD-10-CM

## 2019-05-28 DIAGNOSIS — F339 Major depressive disorder, recurrent, unspecified: Secondary | ICD-10-CM

## 2019-05-28 DIAGNOSIS — F418 Other specified anxiety disorders: Secondary | ICD-10-CM

## 2019-05-28 DIAGNOSIS — E1169 Type 2 diabetes mellitus with other specified complication: Secondary | ICD-10-CM | POA: Diagnosis not present

## 2019-05-28 DIAGNOSIS — N183 Chronic kidney disease, stage 3 unspecified: Secondary | ICD-10-CM | POA: Diagnosis not present

## 2019-05-28 LAB — BAYER DCA HB A1C WAIVED: HB A1C (BAYER DCA - WAIVED): 7.7 % — ABNORMAL HIGH (ref <7.0)

## 2019-05-28 MED ORDER — OZEMPIC (0.25 OR 0.5 MG/DOSE) 2 MG/1.5ML ~~LOC~~ SOPN: 0.5000 mg | PEN_INJECTOR | SUBCUTANEOUS | 2 refills | Status: DC

## 2019-05-28 MED ORDER — SERTRALINE HCL 100 MG PO TABS: 100.0000 mg | ORAL_TABLET | Freq: Every day | ORAL | 3 refills | Status: DC

## 2019-05-28 MED ORDER — HYDRALAZINE HCL 25 MG PO TABS: 25.0000 mg | ORAL_TABLET | Freq: Three times a day (TID) | ORAL | 3 refills | Status: DC

## 2019-05-28 NOTE — Progress Notes (Signed)
 BP (!) 99/58   Pulse 73   Temp (!) 97.5 F (36.4 C) (Temporal)   Ht 5' 7" (1.702 m)   Wt 278 lb 4 oz (126.2 kg)   BMI 43.58 kg/m    Subjective:   Patient ID: Kenneth Fuller, male    DOB: 02/20/1945, 74 y.o.   MRN: [id number]  HPI: Kenneth Fuller is a 74 y.o. male presenting on 05/28/2019 for Medical Management of Chronic Issues   HPI Type 2 diabetes mellitus Patient comes in today for recheck of his diabetes. Patient has been currently taking Farxiga, A1c is 7.7. Patient is currently on an ACE inhibitor/ARB. Patient has not seen an ophthalmologist this year. Patient denies any issues with their feet.   Hypertension Patient is currently on bisoprolol and hydralazine and lisinopril and spironolactone, and their blood pressure today is 99/58. Patient denies any lightheadedness or dizziness. Patient denies headaches, blurred vision, chest pains, shortness of breath, or weakness. Denies any side effects from medication and is content with current medication.   Hyperlipidemia Patient is coming in for recheck of his hyperlipidemia. The patient is currently taking atorvastatin. They deny any issues with myalgias or history of liver damage from it. They deny any focal numbness or weakness or chest pain.   Depression and anxiety Patient says his depression and anxiety has not been doing well and he is currently taking the Zoloft 50 mg and says just been feeling down and have a lot more issues especially with all of his health issues that is been driving and feeling down.  He denies any suicidal ideations or thoughts of hurting himself but he does have decreased interest and change in appetite and just feeling down in general. Depression screen PHQ 2/9 05/28/2019 05/15/2019 02/15/2019 11/15/2018 10/05/2018  Decreased Interest 2 0 0 0 0  Down, Depressed, Hopeless 2 0 1 0 1  PHQ - 2 Score 4 0 1 0 1  Altered sleeping 3 - - - -  Tired, decreased energy 2 - - - -  Change in appetite 0 - - - -    Feeling bad or failure about yourself  0 - - - -  Trouble concentrating 1 - - - -  Moving slowly or fidgety/restless 0 - - - -  Suicidal thoughts 0 - - - -  PHQ-9 Score 10 - - - -  Difficult doing work/chores Somewhat difficult - - - -  Some recent data might be hidden    Relevant past medical, surgical, family and social history reviewed and updated as indicated. Interim medical history since our last visit reviewed. Allergies and medications reviewed and updated.  Review of Systems  Constitutional: Negative for chills and fever.  Eyes: Negative for visual disturbance.  Respiratory: Negative for shortness of breath and wheezing.   Cardiovascular: Negative for chest pain and leg swelling.  Musculoskeletal: Negative for back pain and gait problem.  Skin: Negative for rash.  Neurological: Negative for dizziness, weakness, light-headedness and numbness.  Psychiatric/Behavioral: Positive for dysphoric mood and sleep disturbance. Negative for decreased concentration, self-injury and suicidal ideas. The patient is nervous/anxious.   All other systems reviewed and are negative.   Per HPI unless specifically indicated above   Allergies as of 05/28/2019      Reactions   Amlodipine Besy-benazepril Hcl Swelling, Other (See Comments)   Makes tongue swell (lotrel)   Oxycodone Itching   Phenergan [promethazine Hcl] Other (See Comments)   "I can't remember."   Hydrocodone Itching     Can tolerate in low doses      Medication List       Accurate as of May 28, 2019 12:10 PM. If you have any questions, ask your nurse or doctor.        STOP taking these medications   HYDROcodone-acetaminophen 5-325 MG tablet Commonly known as: NORCO/VICODIN Stopped by:  A , MD   tamsulosin 0.4 MG Caps capsule Commonly known as: FLOMAX Stopped by:  A , MD     TAKE these medications   albuterol 1.25 MG/3ML nebulizer solution Commonly known as: ACCUNEB Take 1 ampule  by nebulization every 6 (six) hours as needed for wheezing.   albuterol 108 (90 Base) MCG/ACT inhaler Commonly known as: VENTOLIN HFA Inhale 2 puffs into the lungs every 6 (six) hours as needed for wheezing or shortness of breath.   amitriptyline 25 MG tablet Commonly known as: ELAVIL 1/2 pill each bedtime x 1 week, then 1 pill nightly x 1 week, then 1 1/2 pills nightly x 1 week, then 2 pills nightly thereafter.   atorvastatin 40 MG tablet Commonly known as: LIPITOR Take 1 tablet by mouth once daily   bisoprolol 5 MG tablet Commonly known as: ZEBETA Take 0.5 tablets (2.5 mg total) by mouth daily.   Blood Pressure Cuff Misc 1 each by Does not apply route daily.   clonazePAM 0.5 MG tablet Commonly known as: KLONOPIN Take 1 tablet (0.5 mg total) by mouth at bedtime as needed.   Farxiga 10 MG Tabs tablet Generic drug: dapagliflozin propanediol Take 10 mg by mouth daily.   furosemide 40 MG tablet Commonly known as: LASIX Take 80 mg (two tablets) twice daily until your weight gets down to 275 lbs then decrease it back to 40 mg twice daily.   glucose blood test strip Test BS daily Dx E11.9   hydrALAZINE 25 MG tablet Commonly known as: APRESOLINE Take 1 tablet (25 mg total) by mouth 3 (three) times daily. What changed: how much to take Changed by:  A , MD   isosorbide dinitrate 30 MG tablet Commonly known as: ISORDIL TAKE 1 TABLET BY MOUTH THREE TIMES DAILY   lisinopril 20 MG tablet Commonly known as: ZESTRIL Take 2 tablets (40 mg total) by mouth daily.   mometasone-formoterol 100-5 MCG/ACT Aero Commonly known as: DULERA Inhale 2 puffs into the lungs 2 (two) times daily.   OneTouch Delica Lancets 33G Misc 1 each by Does not apply route daily. Test 1X per day and prn   accu-chek soft touch lancets 1 each by Other route daily. Use as instructed   OXYGEN Inhale 2 L into the lungs at bedtime. 2lpm with sleep   Ozempic (0.25 or 0.5 MG/DOSE) 2 MG/1.5ML  Sopn Generic drug: Semaglutide(0.25 or 0.5MG/DOS) Inject 0.5 mg into the skin once a week. Started by:  A , MD   Potassium Chloride ER 20 MEQ Tbcr Take 1 tablet by mouth daily.   Pulse Oximeter Misc 1 each by Does not apply route continuous as needed.   rizatriptan 5 MG tablet Commonly known as: Maxalt Take 1 tablet (5 mg total) by mouth as needed for migraine. May repeat in 2 hours if needed   sertraline 100 MG tablet Commonly known as: ZOLOFT Take 1 tablet (100 mg total) by mouth daily. What changed:   medication strength  how much to take Changed by:  A , MD   spironolactone 25 MG tablet Commonly known as: ALDACTONE Take 1 tablet (25 mg total) by mouth daily.     VITAMIN D PO Take 5,000 Units by mouth every morning.        Objective:   BP (!) 99/58   Pulse 73   Temp (!) 97.5 F (36.4 C) (Temporal)   Ht 5' 7" (1.702 m)   Wt 278 lb 4 oz (126.2 kg)   BMI 43.58 kg/m   Wt Readings from Last 3 Encounters:  05/28/19 278 lb 4 oz (126.2 kg)  05/15/19 282 lb (127.9 kg)  04/17/19 282 lb (127.9 kg)    Physical Exam Vitals and nursing note reviewed.  Constitutional:      General: He is not in acute distress.    Appearance: He is well-developed. He is not diaphoretic.  Eyes:     General: No scleral icterus.    Conjunctiva/sclera: Conjunctivae normal.  Neck:     Thyroid: No thyromegaly.  Cardiovascular:     Rate and Rhythm: Normal rate and regular rhythm.     Heart sounds: Normal heart sounds. No murmur.  Pulmonary:     Effort: Pulmonary effort is normal. No respiratory distress.     Breath sounds: Normal breath sounds. No wheezing.  Musculoskeletal:        General: Normal range of motion.     Cervical back: Neck supple.  Lymphadenopathy:     Cervical: No cervical adenopathy.  Skin:    General: Skin is warm and dry.     Findings: No rash.  Neurological:     Mental Status: He is alert and oriented to person, place, and time.       Coordination: Coordination normal.  Psychiatric:        Behavior: Behavior normal.       Assessment & Plan:   Problem List Items Addressed This Visit      Cardiovascular and Mediastinum   Essential hypertension   Relevant Medications   hydrALAZINE (APRESOLINE) 25 MG tablet   CHF (congestive heart failure) (HCC)   Relevant Medications   hydrALAZINE (APRESOLINE) 25 MG tablet     Endocrine   Diabetes mellitus type II, non insulin dependent (HCC) - Primary   Relevant Medications   Semaglutide,0.25 or 0.5MG/DOS, (OZEMPIC, 0.25 OR 0.5 MG/DOSE,) 2 MG/1.5ML SOPN   Other Relevant Orders   Bayer DCA Hb A1c Waived (Completed)   CMP14+EGFR   Hyperlipidemia associated with type 2 diabetes mellitus (HCC)   Relevant Medications   Semaglutide,0.25 or 0.5MG/DOS, (OZEMPIC, 0.25 OR 0.5 MG/DOSE,) 2 MG/1.5ML SOPN   Other Relevant Orders   Lipid panel     Genitourinary   CKD (chronic kidney disease), stage III   Relevant Orders   CMP14+EGFR   CBC with Differential/Platelet     Other   Depression, recurrent (HCC)   Relevant Medications   sertraline (ZOLOFT) 100 MG tablet   Depression with anxiety   Relevant Medications   sertraline (ZOLOFT) 100 MG tablet      Patient is taking Farxiga 10 mg and it helps with his blood sugars but not quite enough, due to CKD I do not know if you want to increase that, we will add Ozempic instead at a low dose to see if we can help reduce.  A1c is 7.7, blood sugars have been up in the high 100s and low 200s for him.  Blood pressure down and will stop hydralazine, he is having lightheadedness and dizziness so we will stop it, likely down because we started [place] which is helping.  Patient's depression is not under control completely either so we will increase  the Zoloft for him. Follow up plan: Return in about 3 months (around 08/27/2019), or if symptoms worsen or fail to improve, for Diabetes and hypertension and cholesterol recheck.  Counseling  provided for all of the vaccine components Orders Placed This Encounter  Procedures  . Bayer DCA Hb A1c Waived  . CMP14+EGFR  . Lipid panel  . CBC with Differential/Platelet     , MD [facility] 05/28/2019, 12:10 PM     

## 2019-05-29 LAB — CBC WITH DIFFERENTIAL/PLATELET
Basophils Absolute: 0.1 10*3/uL (ref 0.0–0.2)
Basos: 1 %
EOS (ABSOLUTE): 0.2 10*3/uL (ref 0.0–0.4)
Eos: 2 %
Hematocrit: 40.8 % (ref 37.5–51.0)
Hemoglobin: 12.9 g/dL — ABNORMAL LOW (ref 13.0–17.7)
Immature Grans (Abs): 0 10*3/uL (ref 0.0–0.1)
Immature Granulocytes: 0 %
Lymphocytes Absolute: 1.9 10*3/uL (ref 0.7–3.1)
Lymphs: 22 %
MCH: 25.9 pg — ABNORMAL LOW (ref 26.6–33.0)
MCHC: 31.6 g/dL (ref 31.5–35.7)
MCV: 82 fL (ref 79–97)
Monocytes Absolute: 0.7 10*3/uL (ref 0.1–0.9)
Monocytes: 8 %
Neutrophils Absolute: 5.9 10*3/uL (ref 1.4–7.0)
Neutrophils: 67 %
Platelets: 238 10*3/uL (ref 150–450)
RBC: 4.99 x10E6/uL (ref 4.14–5.80)
RDW: 15.8 % — ABNORMAL HIGH (ref 11.6–15.4)
WBC: 8.6 10*3/uL (ref 3.4–10.8)

## 2019-05-29 LAB — LIPID PANEL
Chol/HDL Ratio: 2.1 ratio (ref 0.0–5.0)
Cholesterol, Total: 81 mg/dL — ABNORMAL LOW (ref 100–199)
HDL: 39 mg/dL — ABNORMAL LOW (ref >39)
LDL Chol Calc (NIH): 23 mg/dL (ref 0–99)
Triglycerides: 95 mg/dL (ref 0–149)
VLDL Cholesterol Cal: 19 mg/dL (ref 5–40)

## 2019-05-29 LAB — CMP14+EGFR
ALT: 28 IU/L (ref 0–44)
AST: 21 IU/L (ref 0–40)
Albumin/Globulin Ratio: 1.8 (ref 1.2–2.2)
Albumin: 4.4 g/dL (ref 3.7–4.7)
Alkaline Phosphatase: 84 IU/L (ref 39–117)
BUN/Creatinine Ratio: 25 — ABNORMAL HIGH (ref 10–24)
BUN: 44 mg/dL — ABNORMAL HIGH (ref 8–27)
Bilirubin Total: 0.3 mg/dL (ref 0.0–1.2)
CO2: 23 mmol/L (ref 20–29)
Calcium: 9.7 mg/dL (ref 8.6–10.2)
Chloride: 97 mmol/L (ref 96–106)
Creatinine, Ser: 1.73 mg/dL — ABNORMAL HIGH (ref 0.76–1.27)
GFR calc Af Amer: 44 mL/min/{1.73_m2} — ABNORMAL LOW (ref >59)
GFR calc non Af Amer: 38 mL/min/{1.73_m2} — ABNORMAL LOW (ref >59)
Globulin, Total: 2.5 g/dL (ref 1.5–4.5)
Glucose: 180 mg/dL — ABNORMAL HIGH (ref 65–99)
Potassium: 5.1 mmol/L (ref 3.5–5.2)
Sodium: 138 mmol/L (ref 134–144)
Total Protein: 6.9 g/dL (ref 6.0–8.5)

## 2019-05-29 IMAGING — DX DG CHEST 2V
2 series · 2 of 2 positions shown · non-contrast
Comparison: Chest x-ray dated August 04, 2016.

CLINICAL DATA: Shortness of breath for several weeks.

EXAM:
CHEST  2 VIEW

[chest lat]
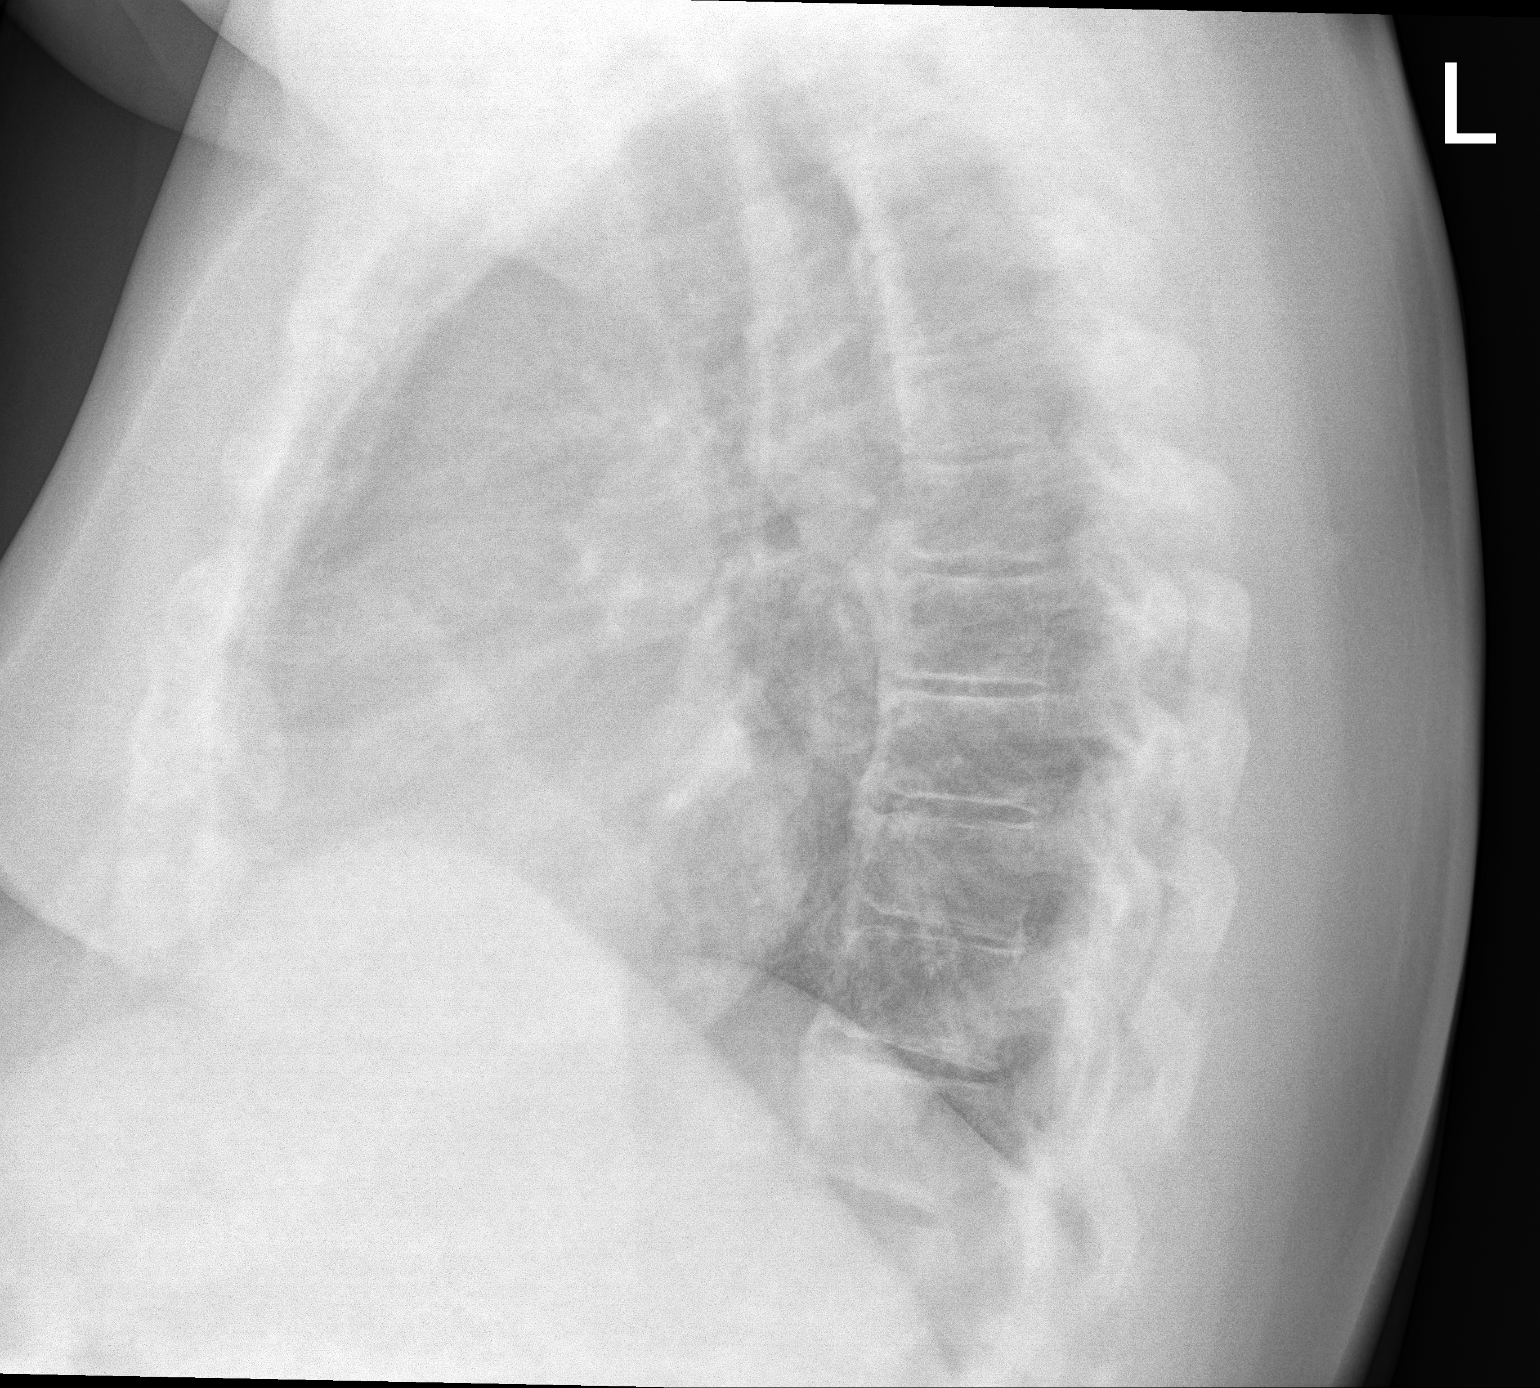

[chest pa]
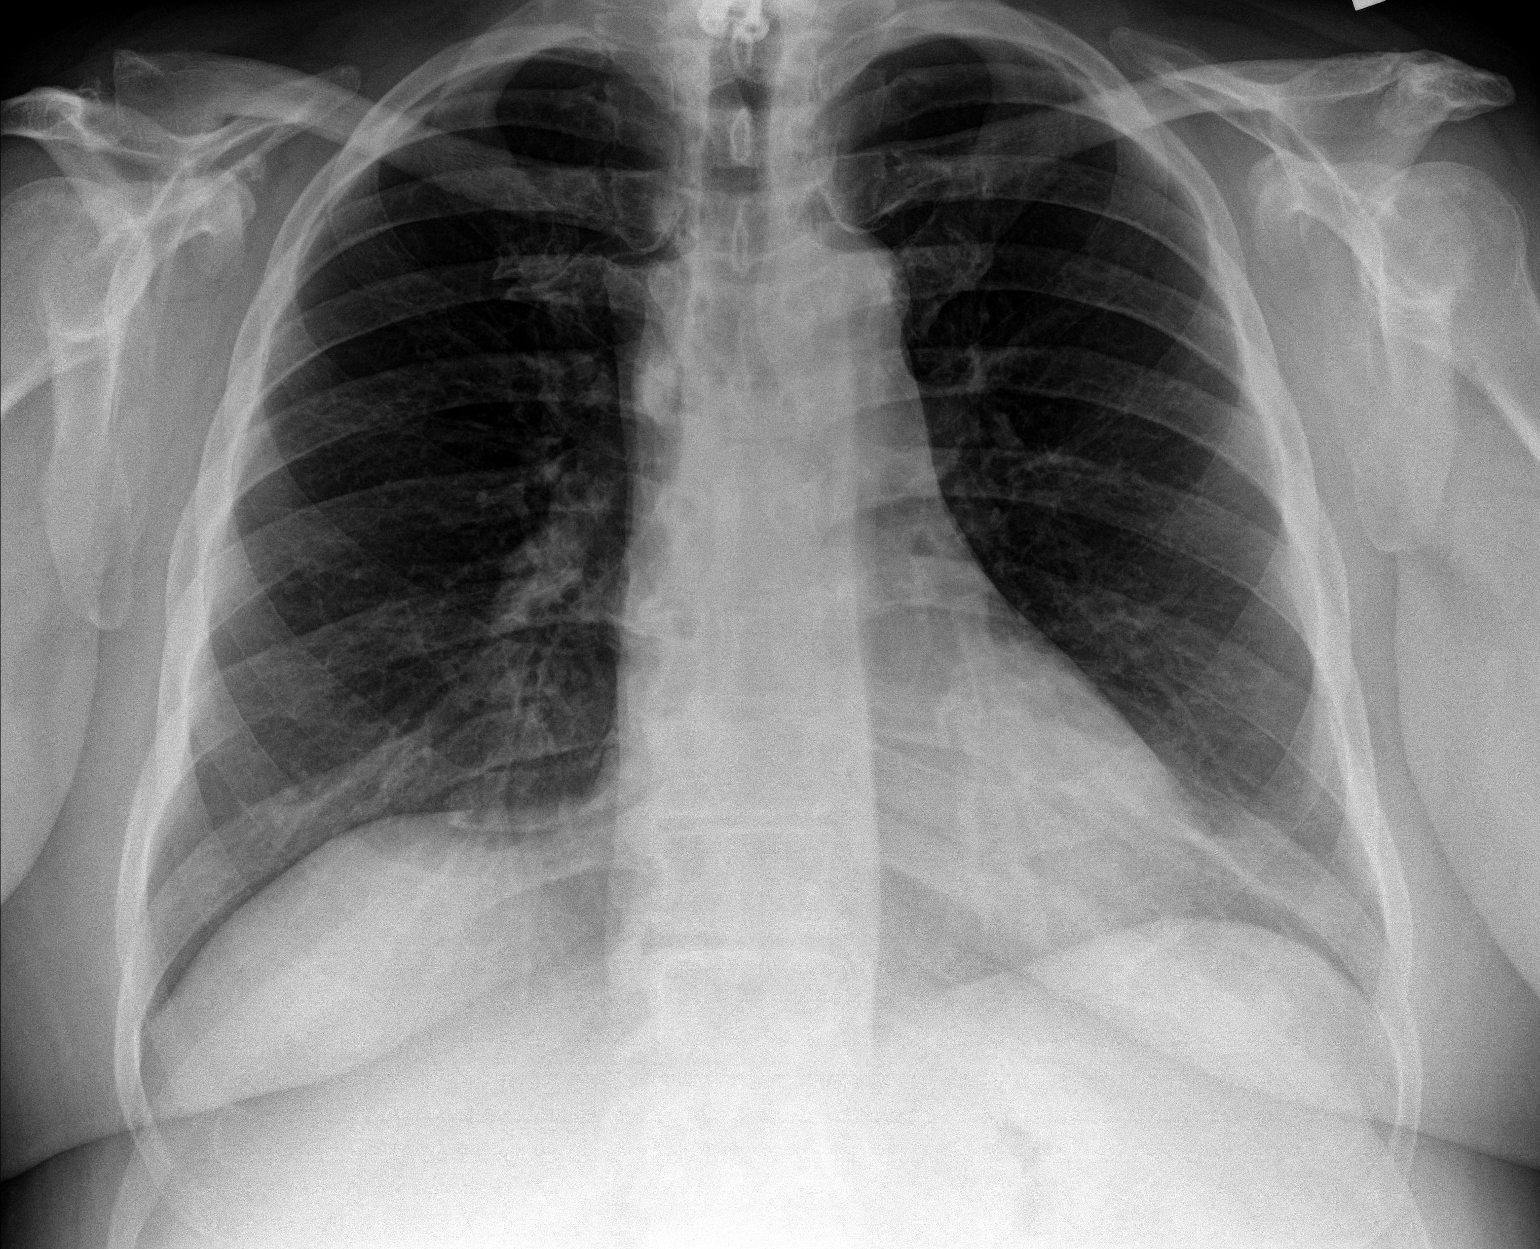

[2 of 2 positions shown; findings below may reference images not displayed]

FINDINGS: The cardiomediastinal silhouette is normal in size. Normal pulmonary
vascularity. No focal consolidation, pleural effusion, or
pneumothorax. No acute osseous abnormality.
IMPRESSION: No active cardiopulmonary disease.

## 2019-05-31 ENCOUNTER — Other Ambulatory Visit: Payer: Self-pay | Admitting: Family Medicine

## 2019-05-31 DIAGNOSIS — N1832 Chronic kidney disease, stage 3b: Secondary | ICD-10-CM

## 2019-05-31 DIAGNOSIS — N179 Acute kidney failure, unspecified: Secondary | ICD-10-CM

## 2019-06-03 ENCOUNTER — Emergency Department (HOSPITAL_COMMUNITY): Payer: PPO

## 2019-06-03 ENCOUNTER — Observation Stay (HOSPITAL_COMMUNITY)
Admission: EM | Admit: 2019-06-03 | Discharge: 2019-06-05 | Disposition: A | Discharge status: Home / Self Care | Payer: PPO | Attending: Family Medicine | Admitting: Family Medicine

## 2019-06-03 ENCOUNTER — Other Ambulatory Visit: Payer: Self-pay

## 2019-06-03 ENCOUNTER — Encounter (HOSPITAL_COMMUNITY): Payer: Self-pay | Admitting: Emergency Medicine

## 2019-06-03 DIAGNOSIS — R55 Syncope and collapse: Secondary | ICD-10-CM | POA: Diagnosis not present

## 2019-06-03 DIAGNOSIS — I5032 Chronic diastolic (congestive) heart failure: Secondary | ICD-10-CM | POA: Insufficient documentation

## 2019-06-03 DIAGNOSIS — R42 Dizziness and giddiness: Secondary | ICD-10-CM

## 2019-06-03 DIAGNOSIS — E119 Type 2 diabetes mellitus without complications: Secondary | ICD-10-CM

## 2019-06-03 DIAGNOSIS — N183 Chronic kidney disease, stage 3 unspecified: Secondary | ICD-10-CM | POA: Insufficient documentation

## 2019-06-03 DIAGNOSIS — I130 Hypertensive heart and chronic kidney disease with heart failure and stage 1 through stage 4 chronic kidney disease, or unspecified chronic kidney disease: Secondary | ICD-10-CM | POA: Diagnosis not present

## 2019-06-03 DIAGNOSIS — E1122 Type 2 diabetes mellitus with diabetic chronic kidney disease: Secondary | ICD-10-CM | POA: Insufficient documentation

## 2019-06-03 DIAGNOSIS — R109 Unspecified abdominal pain: Secondary | ICD-10-CM | POA: Diagnosis not present

## 2019-06-03 DIAGNOSIS — G4733 Obstructive sleep apnea (adult) (pediatric): Secondary | ICD-10-CM

## 2019-06-03 DIAGNOSIS — E875 Hyperkalemia: Secondary | ICD-10-CM | POA: Diagnosis not present

## 2019-06-03 DIAGNOSIS — Z87891 Personal history of nicotine dependence: Secondary | ICD-10-CM | POA: Insufficient documentation

## 2019-06-03 DIAGNOSIS — N179 Acute kidney failure, unspecified: Secondary | ICD-10-CM | POA: Diagnosis present

## 2019-06-03 DIAGNOSIS — Z7984 Long term (current) use of oral hypoglycemic drugs: Secondary | ICD-10-CM | POA: Diagnosis not present

## 2019-06-03 DIAGNOSIS — M109 Gout, unspecified: Secondary | ICD-10-CM | POA: Diagnosis present

## 2019-06-03 DIAGNOSIS — N289 Disorder of kidney and ureter, unspecified: Secondary | ICD-10-CM | POA: Diagnosis not present

## 2019-06-03 DIAGNOSIS — I5042 Chronic combined systolic (congestive) and diastolic (congestive) heart failure: Secondary | ICD-10-CM

## 2019-06-03 DIAGNOSIS — F339 Major depressive disorder, recurrent, unspecified: Secondary | ICD-10-CM | POA: Diagnosis present

## 2019-06-03 DIAGNOSIS — Z20822 Contact with and (suspected) exposure to covid-19: Secondary | ICD-10-CM | POA: Insufficient documentation

## 2019-06-03 DIAGNOSIS — E785 Hyperlipidemia, unspecified: Secondary | ICD-10-CM | POA: Diagnosis present

## 2019-06-03 DIAGNOSIS — I2510 Atherosclerotic heart disease of native coronary artery without angina pectoris: Secondary | ICD-10-CM | POA: Diagnosis present

## 2019-06-03 DIAGNOSIS — I959 Hypotension, unspecified: Secondary | ICD-10-CM | POA: Diagnosis not present

## 2019-06-03 DIAGNOSIS — J449 Chronic obstructive pulmonary disease, unspecified: Secondary | ICD-10-CM | POA: Diagnosis not present

## 2019-06-03 DIAGNOSIS — K219 Gastro-esophageal reflux disease without esophagitis: Secondary | ICD-10-CM | POA: Diagnosis present

## 2019-06-03 DIAGNOSIS — I10 Essential (primary) hypertension: Secondary | ICD-10-CM

## 2019-06-03 DIAGNOSIS — E1169 Type 2 diabetes mellitus with other specified complication: Secondary | ICD-10-CM | POA: Diagnosis present

## 2019-06-03 LAB — COMPREHENSIVE METABOLIC PANEL
ALT: 41 U/L (ref 0–44)
AST: 30 U/L (ref 15–41)
Albumin: 4.1 g/dL (ref 3.5–5.0)
Alkaline Phosphatase: 79 U/L (ref 38–126)
Anion gap: 12 (ref 5–15)
BUN: 51 mg/dL — ABNORMAL HIGH (ref 8–23)
CO2: 29 mmol/L (ref 22–32)
Calcium: 10 mg/dL (ref 8.9–10.3)
Chloride: 93 mmol/L — ABNORMAL LOW (ref 98–111)
Creatinine, Ser: 2.26 mg/dL — ABNORMAL HIGH (ref 0.61–1.24)
GFR calc Af Amer: 32 mL/min — ABNORMAL LOW (ref >60)
GFR calc non Af Amer: 28 mL/min — ABNORMAL LOW (ref >60)
Glucose, Bld: 162 mg/dL — ABNORMAL HIGH (ref 70–99)
Potassium: 5.3 mmol/L — ABNORMAL HIGH (ref 3.5–5.1)
Sodium: 134 mmol/L — ABNORMAL LOW (ref 135–145)
Total Bilirubin: 0.6 mg/dL (ref 0.3–1.2)
Total Protein: 7.7 g/dL (ref 6.5–8.1)

## 2019-06-03 LAB — CBC WITH DIFFERENTIAL/PLATELET
Abs Immature Granulocytes: 0.03 10*3/uL (ref 0.00–0.07)
Basophils Absolute: 0.1 10*3/uL (ref 0.0–0.1)
Basophils Relative: 1 %
Eosinophils Absolute: 0.3 10*3/uL (ref 0.0–0.5)
Eosinophils Relative: 3 %
HCT: 41.9 % (ref 39.0–52.0)
Hemoglobin: 13.2 g/dL (ref 13.0–17.0)
Immature Granulocytes: 0 %
Lymphocytes Relative: 23 %
Lymphs Abs: 2.4 10*3/uL (ref 0.7–4.0)
MCH: 26.1 pg (ref 26.0–34.0)
MCHC: 31.5 g/dL (ref 30.0–36.0)
MCV: 82.8 fL (ref 80.0–100.0)
Monocytes Absolute: 0.9 10*3/uL (ref 0.1–1.0)
Monocytes Relative: 8 %
Neutro Abs: 6.9 10*3/uL (ref 1.7–7.7)
Neutrophils Relative %: 65 %
Platelets: 247 10*3/uL (ref 150–400)
RBC: 5.06 MIL/uL (ref 4.22–5.81)
RDW: 16.5 % — ABNORMAL HIGH (ref 11.5–15.5)
WBC: 10.5 10*3/uL (ref 4.0–10.5)
nRBC: 0 % (ref 0.0–0.2)

## 2019-06-03 LAB — ETHANOL: Alcohol, Ethyl (B): <10 mg/dL (ref <10)

## 2019-06-03 LAB — LACTIC ACID, PLASMA: Lactic Acid, Venous: 1.5 mmol/L (ref 0.5–1.9)

## 2019-06-03 MED ORDER — IOHEXOL 9 MG/ML PO SOLN
ORAL | Status: AC
  Filled 2019-06-03: qty 4500

## 2019-06-03 MED ORDER — SODIUM CHLORIDE 0.9 % IV BOLUS
1000.0000 mL | Freq: Once | INTRAVENOUS | Status: AC
  Administered 2019-06-03: 1000 mL via INTRAVENOUS

## 2019-06-03 MED ORDER — SODIUM CHLORIDE 0.9 % IV SOLN: INTRAVENOUS | Status: DC

## 2019-06-03 NOTE — ED Triage Notes (Signed)
Pt c/o dizziness and low BP x 2 weeks, reports he PCP has decreased meds within the past 2 weeks but has still been running low; BP in triage 89/50, nausea x 2 weeks  Pt c/o constipation x 2 weeks; no relief with stool softeners, enemas, prunes and prune juice  C/O right sided "kidney pain" x 2 weeks with decreased urine stream, denies pain with urination

## 2019-06-03 NOTE — ED Provider Notes (Addendum)
[facility] EMERGENCY DEPARTMENT Provider Note   CSN: [id number] Arrival date & time: 06/03/19  2036     History Chief Complaint  Patient presents with  . Dizziness    Kenneth Fuller is a 74 y.o. male.  HPI   Patient presents with multiple complaints. Patient knowledges having multiple issues, but now over the past week or so has had dizziness, lightheadedness, multiple episodes of near syncope without falling.  In addition, patient has abdominal discomfort, constipation.  It seems as though he has pain in his right flank as well.  This pain seems to be sore, severe, not clearly improved or exacerbated by anything.  Patient was seen and evaluated at another emergency department.  He notes that after that visit he was told he had substantial stool burden.  He subsequently spoke with his physician, has had regimen initiated for bowel movements including prune juice.  In addition, patient has had recent decrease in his blood pressure regimen.\ Tonight after speaking with a home health nurse he was sent here for evaluation. Past Medical History:  Diagnosis Date  . Allergy   . Asthma   . CAD (coronary artery disease)   . CHF (congestive heart failure) ([place])   . Diabetes mellitus   . Fibromyalgia   . GERD (gastroesophageal reflux disease)   . GI bleeding   . Gout   . Hyperlipidemia   . Hypertension   . Hypogonadism male   . Insomnia   . MRSA cellulitis   . Neuropathy   . Obesity   . Shortness of breath dyspnea    with exertion   . Sleep apnea    cpap- 14   . Wheezing    no asthma diagnosis    Patient Active Problem List   Diagnosis Date Noted  . Insomnia   . CHF (congestive heart failure) ([place]) 01/06/2018  . Depression with anxiety 01/04/2018  . CKD (chronic kidney disease), stage III 01/04/2018  . Chronic obstructive pulmonary disease ([place]) 01/04/2018  . Depression, recurrent ([place]) 11/11/2017  . Chronic diastolic heart failure ([place]) 04/13/2017  . Swelling of  lower extremity 11/30/2016  . Obesity, Class III, BMI 40-49.9 (morbid obesity) ([place]) 08/21/2015  . Coronary artery disease involving native coronary artery of native heart without angina pectoris 12/05/2014  . Urinary urgency 10/15/2014  . Spinal stenosis, lumbar region, with neurogenic claudication 01/02/2014  . Hyperlipidemia associated with type 2 diabetes mellitus ([place]) 10/25/2013  . COLONIC POLYPS, ADENOMATOUS 02/25/2007  . Diabetes mellitus type II, non insulin dependent ([place]) 02/25/2007  . Gout 02/25/2007  . Essential hypertension 02/25/2007  . Cough variant asthma 02/25/2007  . OSA on CPAP 02/25/2007  . FATIGUE, CHRONIC 02/25/2007  . HEADACHE, CHRONIC 02/25/2007  . GERD (gastroesophageal reflux disease) 02/25/2007    Past Surgical History:  Procedure Laterality Date  . BACK SURGERY    . CARDIAC CATHETERIZATION    . COLONOSCOPY    . LEFT HEART CATH AND CORONARY ANGIOGRAPHY N/A 12/02/2016   Procedure: LEFT HEART CATH AND CORONARY ANGIOGRAPHY;  Surgeon: Jettie Booze, MD;  Location: [facility];  Service: Cardiovascular;  Laterality: N/A;  . LUMBAR LAMINECTOMY/DECOMPRESSION MICRODISCECTOMY N/A 01/02/2014   Procedure: CENTRAL DECOMPRESSION LUMBAR LAMINECTOMY L3-L4, L4-L5;  Surgeon: Tobi Bastos, MD;  Location: [facility];  Service: Orthopedics;  Laterality: N/A;  . neck fusion         Family History  Problem Relation Age of Onset  . Colon cancer Mother   . Diabetes Father  siblings  . Heart disease Father        brother  . Heart attack Father   . Kidney disease Sister   . Heart failure Sister   . Heart disease Brother   . Heart attack Son 40  . Drug abuse Sister   . Heart disease Brother   . Deep vein thrombosis Brother   . Colon polyps Neg Hx     Social History   Tobacco Use  . Smoking status: Former Smoker    Quit date: 02/08/1969    Years since quitting: 50.3  . Smokeless tobacco: Former Systems developer    Quit date: 1971  Substance Use Topics    . Alcohol use: No    Alcohol/week: 0.0 standard drinks  . Drug use: No    Home Medications Prior to Admission medications   Medication Sig Start Date End Date Taking? Authorizing Provider  albuterol (ACCUNEB) 1.25 MG/3ML nebulizer solution Take 1 ampule by nebulization every 6 (six) hours as needed for wheezing.    [provider]  albuterol (PROVENTIL HFA;VENTOLIN HFA) 108 (90 Base) MCG/ACT inhaler Inhale 2 puffs into the lungs every 6 (six) hours as needed for wheezing or shortness of breath. 07/20/17   Brand Males, MD  amitriptyline (ELAVIL) 25 MG tablet 1/2 pill each bedtime x 1 week, then 1 pill nightly x 1 week, then 1 1/2 pills nightly x 1 week, then 2 pills nightly thereafter. 03/14/19   Star Age, MD  atorvastatin (LIPITOR) 40 MG tablet Take 1 tablet by mouth once daily 05/14/19   [name], [name], MD  bisoprolol (ZEBETA) 5 MG tablet Take 0.5 tablets (2.5 mg total) by mouth daily. 10/05/18   [name], [name], MD  Blood Pressure Monitoring (BLOOD PRESSURE CUFF) MISC 1 each by Does not apply route daily. 05/22/18   [name], [name], MD  Cholecalciferol (VITAMIN D PO) Take 5,000 Units by mouth every morning.     [provider]  clonazePAM (KLONOPIN) 0.5 MG tablet Take 1 tablet (0.5 mg total) by mouth at bedtime as needed. Patient not taking: Reported on 05/28/2019 10/05/18   [name], [name], MD  dapagliflozin propanediol (FARXIGA) 10 MG TABS tablet Take 10 mg by mouth daily.    [provider]  furosemide (LASIX) 40 MG tablet Take 80 mg (two tablets) twice daily until your weight gets down to 275 lbs then decrease it back to 40 mg twice daily. 04/11/19   Croitoru, Mihai, MD  glucose blood test strip Test BS daily Dx E11.9 05/01/19   [name], [name], MD  hydrALAZINE (APRESOLINE) 25 MG tablet Take 1 tablet (25 mg total) by mouth 3 (three) times daily. 05/28/19   [name], [name], MD  isosorbide dinitrate (ISORDIL) 30 MG tablet TAKE 1 TABLET BY  MOUTH THREE TIMES DAILY 05/25/19   [name], [name], MD  Lancets (ACCU-CHEK SOFT TOUCH) lancets 1 each by Other route daily. Use as instructed 04/18/18   [name], [name], MD  lisinopril (ZESTRIL) 20 MG tablet Take 2 tablets (40 mg total) by mouth daily. 04/04/19 07/03/19  Baruch Gouty, FNP  Misc. Devices (PULSE OXIMETER) MISC 1 each by Does not apply route continuous as needed. 07/26/18   [name], [name], MD  mometasone-formoterol (DULERA) 100-5 MCG/ACT AERO Inhale 2 puffs into the lungs 2 (two) times daily.    [provider]  [facility] DELICA LANCETS 03U MISC 1 each by Does not apply route daily. Test 1X per day and prn 03/30/17  [name], [name], MD  OXYGEN Inhale 2 L into the lungs at bedtime. 2lpm with sleep     [provider]  Potassium Chloride ER 20 MEQ TBCR Take 1 tablet by mouth daily. 08/08/18   Almyra Deforest, PA  rizatriptan (MAXALT) 5 MG tablet Take 1 tablet (5 mg total) by mouth as needed for migraine. May repeat in 2 hours if needed 02/15/19   [name], [name], MD  Semaglutide,0.25 or 0.5MG/DOS, (OZEMPIC, 0.25 OR 0.5 MG/DOSE,) 2 MG/1.5ML SOPN Inject 0.5 mg into the skin once a week. 05/28/19   [name], [name], MD  sertraline (ZOLOFT) 100 MG tablet Take 1 tablet (100 mg total) by mouth daily. 05/28/19   [name], [name], MD  spironolactone (ALDACTONE) 25 MG tablet Take 1 tablet (25 mg total) by mouth daily. 04/11/19 07/10/19  Croitoru, Mihai, MD    Allergies    Amlodipine besy-benazepril hcl, Oxycodone, Phenergan [promethazine hcl], and Hydrocodone  Review of Systems   Review of Systems  Constitutional:       Per HPI, otherwise negative  HENT:       Per HPI, otherwise negative  Respiratory:       Per HPI, otherwise negative  Cardiovascular:       Per HPI, otherwise negative  Gastrointestinal: Negative for vomiting.  Endocrine:       Negative aside from HPI  Genitourinary:       Neg aside from HPI   Musculoskeletal:       Per HPI, otherwise  negative  Skin: Negative.   Neurological: Positive for weakness and light-headedness. Negative for syncope.    Physical Exam Updated Vital Signs BP 111/68   Pulse 78   Temp 98.1 F (36.7 C) (Oral)   Resp (!) 23   Ht _0  (1.702 m)   Wt 124.7 kg   SpO2 96%   BMI 43.07 kg/m   Physical Exam Vitals and nursing note reviewed.  Constitutional:      General: He is not in acute distress.    Appearance: He is well-developed.  HENT:     Head: Normocephalic and atraumatic.  Eyes:     Conjunctiva/sclera: Conjunctivae normal.  Cardiovascular:     Rate and Rhythm: Normal rate and regular rhythm.  Pulmonary:     Effort: Pulmonary effort is normal. No respiratory distress.     Breath sounds: No stridor.  Abdominal:     General: There is distension.     Tenderness: There is abdominal tenderness. There is guarding.  Skin:    General: Skin is warm and dry.  Neurological:     Mental Status: He is alert and oriented to person, place, and time.     ED Results / Procedures / Treatments   Labs (all labs ordered are listed, but only abnormal results are displayed) Labs Reviewed  COMPREHENSIVE METABOLIC PANEL - Abnormal; Notable for the following components:      Result Value   Sodium 134 (*)    Potassium 5.3 (*)    Chloride 93 (*)    Glucose, Bld 162 (*)    BUN 51 (*)    Creatinine, Ser 2.26 (*)    GFR calc non Af Amer 28 (*)    GFR calc Af Amer 32 (*)    All other components within normal limits  CBC WITH DIFFERENTIAL/PLATELET - Abnormal; Notable for the following components:   RDW 16.5 (*)    All other components within normal limits  LACTIC ACID, PLASMA  ETHANOL  URINALYSIS, ROUTINE W REFLEX MICROSCOPIC    EKG EKG Interpretation  Date/Time:  Sunday June 03 2019 22:07:25 EDT Ventricular Rate:  76 PR Interval:    QRS Duration: 106 QT Interval:  373 QTC Calculation: 420 R Axis:   31 Text Interpretation: Sinus rhythm Low voltage, precordial leads Non-specific  intra-ventricular conduction delay Abnormal ECG Confirmed by Carmin Muskrat [phone]) on 06/03/2019 10:42:32 PM   Radiology DG Chest Port 1 View  Result Date: 06/03/2019 CLINICAL DATA:  Dizziness, hypotension for 2 weeks, nausea EXAM: PORTABLE CHEST 1 VIEW COMPARISON:  04/03/2019 FINDINGS: Single frontal view of the chest demonstrates an unremarkable cardiac silhouette. No airspace disease, effusion, or pneumothorax. No acute bony abnormalities. IMPRESSION: 1. No acute process. Electronically Signed   By: Randa Ngo M.D.   On: 06/03/2019 22:00    Procedures Procedures (including critical care time)  Medications Ordered in ED Medications  sodium chloride 0.9 % bolus 1,000 mL (1,000 mLs Intravenous New Bag/Given 06/03/19 2300)    And  0.9 %  sodium chloride infusion (has no administration in time range)  iohexol (OMNIPAQUE) 9 MG/ML oral solution (  Contrast Given 06/03/19 2341)    ED Course  I have reviewed the triage vital signs and the nursing notes.  Pertinent labs & imaging results that were available during my care of the patient were reviewed by me and considered in my medical decision making (see chart for details).   With consideration of infection versus obstruction versus peritonitis versus medication effect the patient had labs, CT ordered.  11:45 PM I reviewed the patient's chart demonstrates worsening renal function since his visit to another healthcare facility.  I reviewed the labs thus far with the patient, with his daughter as well who is participating via telephone. Labs notable, as above for worsening renal function, hyperkalemia.  Patient continues to receive fluid resuscitation, and with consideration for obstruction, as above, CT scan is pending.  This elderly male presents with worsening fatigue, lightheadedness, symptomatic changes that may be secondary to the patient's worsening renal function, which is demonstrated on labs today and over the past few  weeks. Patient has multiple medical issues including ongoing diuretic use, which is likely contributing to his pathology.  Patient is awake, alert, afebrile, with consideration of other causes including obstruction, CT scan is pending.  However, patient will require admission regardless due to worsening renal function, ongoing lightheadedness. Final Clinical Impression(s) / ED Diagnoses Final diagnoses:  Lightheadedness  Renal dysfunction  Hyperkalemia     Carmin Muskrat, MD 06/04/19 [phone]

## 2019-06-03 NOTE — ED Notes (Addendum)
CT advised that they would get patient at 1 a.m. for CT scan

## 2019-06-03 NOTE — ED Notes (Signed)
Pt is followed by [facility]  Reports "My BP was sky high aqnd they changed my medicine"  Reports low BP and reports " they saw me and told me I would get used to it"  Continues dizzy with low BP for 2 weeks   Has seen provider at [facility]  Here for eval of hypotension and dizziness

## 2019-06-04 ENCOUNTER — Telehealth: Payer: Self-pay

## 2019-06-04 DIAGNOSIS — R55 Syncope and collapse: Secondary | ICD-10-CM | POA: Diagnosis present

## 2019-06-04 DIAGNOSIS — R42 Dizziness and giddiness: Secondary | ICD-10-CM | POA: Diagnosis not present

## 2019-06-04 DIAGNOSIS — N179 Acute kidney failure, unspecified: Secondary | ICD-10-CM | POA: Diagnosis present

## 2019-06-04 DIAGNOSIS — R109 Unspecified abdominal pain: Secondary | ICD-10-CM | POA: Diagnosis not present

## 2019-06-04 LAB — BASIC METABOLIC PANEL
Anion gap: 9 (ref 5–15)
BUN: 50 mg/dL — ABNORMAL HIGH (ref 8–23)
CO2: 26 mmol/L (ref 22–32)
Calcium: 9.2 mg/dL (ref 8.9–10.3)
Chloride: 98 mmol/L (ref 98–111)
Creatinine, Ser: 2.09 mg/dL — ABNORMAL HIGH (ref 0.61–1.24)
GFR calc Af Amer: 35 mL/min — ABNORMAL LOW (ref >60)
GFR calc non Af Amer: 30 mL/min — ABNORMAL LOW (ref >60)
Glucose, Bld: 144 mg/dL — ABNORMAL HIGH (ref 70–99)
Potassium: 4.5 mmol/L (ref 3.5–5.1)
Sodium: 133 mmol/L — ABNORMAL LOW (ref 135–145)

## 2019-06-04 LAB — GLUCOSE, CAPILLARY
Glucose-Capillary: 122 mg/dL — ABNORMAL HIGH (ref 70–99)
Glucose-Capillary: 112 mg/dL — ABNORMAL HIGH (ref 70–99)
Glucose-Capillary: 120 mg/dL — ABNORMAL HIGH (ref 70–99)
Glucose-Capillary: 129 mg/dL — ABNORMAL HIGH (ref 70–99)

## 2019-06-04 LAB — CBC
HCT: 38.6 % — ABNORMAL LOW (ref 39.0–52.0)
Hemoglobin: 12 g/dL — ABNORMAL LOW (ref 13.0–17.0)
MCH: 25.8 pg — ABNORMAL LOW (ref 26.0–34.0)
MCHC: 31.1 g/dL (ref 30.0–36.0)
MCV: 82.8 fL (ref 80.0–100.0)
Platelets: 215 10*3/uL (ref 150–400)
RBC: 4.66 MIL/uL (ref 4.22–5.81)
RDW: 16.4 % — ABNORMAL HIGH (ref 11.5–15.5)
WBC: 10.4 10*3/uL (ref 4.0–10.5)
nRBC: 0 % (ref 0.0–0.2)

## 2019-06-04 LAB — URINALYSIS, ROUTINE W REFLEX MICROSCOPIC
Bilirubin Urine: NEGATIVE
Glucose, UA: 150 mg/dL — AB
Hgb urine dipstick: NEGATIVE
Ketones, ur: NEGATIVE mg/dL
Leukocytes,Ua: NEGATIVE
Nitrite: NEGATIVE
Protein, ur: NEGATIVE mg/dL
Specific Gravity, Urine: 1.015 (ref 1.005–1.030)
pH: 5 (ref 5.0–8.0)

## 2019-06-04 LAB — RESPIRATORY PANEL BY RT PCR (FLU A&B, COVID)
Influenza A by PCR: NEGATIVE
Influenza B by PCR: NEGATIVE
SARS Coronavirus 2 by RT PCR: NEGATIVE

## 2019-06-04 LAB — TROPONIN I (HIGH SENSITIVITY)
Troponin I (High Sensitivity): 5 ng/L (ref <18)
Troponin I (High Sensitivity): 6 ng/L (ref <18)
Troponin I (High Sensitivity): 7 ng/L (ref <18)

## 2019-06-04 MED ORDER — BISACODYL 10 MG RE SUPP: 10.0000 mg | Freq: Every day | RECTAL | Status: DC | PRN

## 2019-06-04 MED ORDER — HEPARIN SODIUM (PORCINE) 5000 UNIT/ML IJ SOLN
5000.0000 [IU] | Freq: Three times a day (TID) | INTRAMUSCULAR | Status: DC
  Administered 2019-06-04 – 2019-06-05 (×5): 5000 [IU] via SUBCUTANEOUS
  Filled 2019-06-04 (×5): qty 1

## 2019-06-04 MED ORDER — ONDANSETRON HCL 4 MG/2ML IJ SOLN
4.0000 mg | Freq: Four times a day (QID) | INTRAMUSCULAR | Status: DC | PRN
  Administered 2019-06-04: 4 mg via INTRAVENOUS
  Filled 2019-06-04: qty 2

## 2019-06-04 MED ORDER — AMITRIPTYLINE HCL 25 MG PO TABS: 25.0000 mg | ORAL_TABLET | Freq: Every day | ORAL | Status: DC

## 2019-06-04 MED ORDER — SODIUM CHLORIDE 0.9% FLUSH: 3.0000 mL | INTRAVENOUS | Status: DC | PRN

## 2019-06-04 MED ORDER — ATORVASTATIN CALCIUM 40 MG PO TABS
40.0000 mg | ORAL_TABLET | Freq: Every day | ORAL | Status: DC
  Administered 2019-06-04 – 2019-06-05 (×2): 40 mg via ORAL
  Filled 2019-06-04 (×2): qty 1

## 2019-06-04 MED ORDER — MOMETASONE FURO-FORMOTEROL FUM 100-5 MCG/ACT IN AERO
2.0000 | INHALATION_SPRAY | Freq: Two times a day (BID) | RESPIRATORY_TRACT | Status: DC
  Administered 2019-06-04 – 2019-06-05 (×3): 2 via RESPIRATORY_TRACT
  Filled 2019-06-04: qty 8.8

## 2019-06-04 MED ORDER — HYDRALAZINE HCL 25 MG PO TABS
25.0000 mg | ORAL_TABLET | Freq: Three times a day (TID) | ORAL | Status: DC
  Administered 2019-06-04: 25 mg via ORAL
  Filled 2019-06-04: qty 1

## 2019-06-04 MED ORDER — POLYETHYLENE GLYCOL 3350 17 G PO PACK
17.0000 g | PACK | Freq: Two times a day (BID) | ORAL | Status: DC
  Administered 2019-06-04 – 2019-06-05 (×3): 17 g via ORAL
  Filled 2019-06-04 (×3): qty 1

## 2019-06-04 MED ORDER — ACETAMINOPHEN 500 MG PO TABS
1000.0000 mg | ORAL_TABLET | Freq: Once | ORAL | Status: AC
  Administered 2019-06-04: 1000 mg via ORAL
  Filled 2019-06-04: qty 2

## 2019-06-04 MED ORDER — INSULIN ASPART 100 UNIT/ML ~~LOC~~ SOLN
0.0000 [IU] | Freq: Three times a day (TID) | SUBCUTANEOUS | Status: DC
  Administered 2019-06-04: 1 [IU] via SUBCUTANEOUS
  Administered 2019-06-05: 2 [IU] via SUBCUTANEOUS
  Administered 2019-06-05 (×2): 1 [IU] via SUBCUTANEOUS

## 2019-06-04 MED ORDER — VITAMIN D 25 MCG (1000 UNIT) PO TABS
5000.0000 [IU] | ORAL_TABLET | Freq: Every morning | ORAL | Status: DC
  Administered 2019-06-04 – 2019-06-05 (×2): 5000 [IU] via ORAL
  Filled 2019-06-04 (×2): qty 5

## 2019-06-04 MED ORDER — ISOSORBIDE MONONITRATE ER 60 MG PO TB24
30.0000 mg | ORAL_TABLET | Freq: Every day | ORAL | Status: DC
  Administered 2019-06-04 – 2019-06-05 (×2): 30 mg via ORAL
  Filled 2019-06-04 (×2): qty 1

## 2019-06-04 MED ORDER — SERTRALINE HCL 50 MG PO TABS
100.0000 mg | ORAL_TABLET | Freq: Every day | ORAL | Status: DC
  Administered 2019-06-04 – 2019-06-05 (×2): 100 mg via ORAL
  Filled 2019-06-04 (×2): qty 2

## 2019-06-04 MED ORDER — TRAMADOL HCL 50 MG PO TABS
50.0000 mg | ORAL_TABLET | Freq: Two times a day (BID) | ORAL | Status: DC | PRN
  Filled 2019-06-04: qty 1

## 2019-06-04 MED ORDER — ONDANSETRON HCL 4 MG PO TABS: 4.0000 mg | ORAL_TABLET | Freq: Four times a day (QID) | ORAL | Status: DC | PRN

## 2019-06-04 MED ORDER — SODIUM CHLORIDE 0.9 % IV SOLN: 250.0000 mL | INTRAVENOUS | Status: DC | PRN

## 2019-06-04 MED ORDER — LABETALOL HCL 5 MG/ML IV SOLN: 10.0000 mg | INTRAVENOUS | Status: DC | PRN

## 2019-06-04 MED ORDER — LACTULOSE 10 GM/15ML PO SOLN
30.0000 g | ORAL | Status: AC
  Administered 2019-06-04 (×3): 30 g via ORAL
  Filled 2019-06-04 (×3): qty 60

## 2019-06-04 MED ORDER — LABETALOL HCL 5 MG/ML IV SOLN
INTRAVENOUS | Status: AC
  Filled 2019-06-04: qty 4

## 2019-06-04 MED ORDER — SODIUM CHLORIDE 0.9% FLUSH
3.0000 mL | Freq: Two times a day (BID) | INTRAVENOUS | Status: DC
  Administered 2019-06-04: 3 mL via INTRAVENOUS

## 2019-06-04 MED ORDER — POLYETHYLENE GLYCOL 3350 17 G PO PACK: 17.0000 g | PACK | Freq: Every day | ORAL | Status: DC | PRN

## 2019-06-04 MED ORDER — SODIUM CHLORIDE 0.9 % IV SOLN: INTRAVENOUS | Status: DC

## 2019-06-04 MED ORDER — ALBUTEROL SULFATE (2.5 MG/3ML) 0.083% IN NEBU: 2.5000 mg | INHALATION_SOLUTION | Freq: Four times a day (QID) | RESPIRATORY_TRACT | Status: DC | PRN

## 2019-06-04 MED ORDER — BISACODYL 10 MG RE SUPP
10.0000 mg | Freq: Two times a day (BID) | RECTAL | Status: AC
  Administered 2019-06-04 (×2): 10 mg via RECTAL
  Filled 2019-06-04 (×2): qty 1

## 2019-06-04 MED ORDER — HYDRALAZINE HCL 25 MG PO TABS
50.0000 mg | ORAL_TABLET | Freq: Three times a day (TID) | ORAL | Status: DC
  Administered 2019-06-04 – 2019-06-05 (×3): 50 mg via ORAL
  Filled 2019-06-04 (×3): qty 2

## 2019-06-04 NOTE — Progress Notes (Signed)
Patient seen and evaluated, chart reviewed, please see EMR for updated orders. Please see full H&P dictated by admitting physician Dr. Scherrie November for same date of service.    Brief Summary:- 74 y.o. male with medical history significant of hypertension, hyperlipidemia, diabetes mellitus type 2, gout, chronic congestive heart failure, GERD, obstructive sleep apnea, COPD, depression admitted on 06/04/2019 with dehydration and AKI   A/p  Acute Renal Failure Creatinine 2.26 on presentation.  Baseline appears to be fluctuating between 1.2-1.7.  Most recent creatinine was 1.7 on 05/28/2019.  Likely worsened renal function secondary to dehydration secondary to use of Lasix, Aldactone, lisinopril. -IV fluid resuscitation -Monitor input output, renal function closely -Continue IV fluids, push oral fluids, continue to hold lisinopril/Lasix/Aldactone  Constipation/Obstipation--- please see abdominal x-rays from 05/15/2019 with significant amount of stool throughout the colon----clinically patient has continued to struggle with significant constipation for more than 3 weeks despite trying prune juice and OTC laxatives at home -Lactulose and Dulcolax as ordered -Be judicious with opiates and muscle relaxants  Hypertension Patient apparently has history of uncontrolled hypertension and his antihypertensives were being uptitrated due to uncontrolled hypertension.  He was also on diuretics at home. -BP improved with hydration, okay to restart hydralazine/isosorbide  Hyperlipidemia -Continue atorvastatin  Diabetes mellitus type 2 -Sliding scale insulin coverage with fingerstick monitoring  Chronic diastolic heart failure -Hold Lasix, spironolactone due to worsening renal function  COPD Stable, not in acute exacerbation -Continue Dulera, albuterol as needed  Depression / Anxiety -Continue sertraline, amitriptyline, Klonopin as needed  GERD -Continue PPI  Left-sided lung nodules Patient noted  to have noncalcified 4 mm and 6 mm left lower lobe lung nodules on abdominal imaging.  Will need to have CT imaging as outpatient on a routine basis for further evaluation.  Right flank pain, possibly musculoskeletal    Patient seen and evaluated, chart reviewed, please see EMR for updated orders. Please see full H&P dictated by admitting physician Dr. Scherrie November for same date of service.

## 2019-06-04 NOTE — Care Management Obs Status (Signed)
[facility] NOTIFICATION   Patient Details  Name: Kenneth Fuller MRN: [id number] Date of Birth: 01/04/1946   Medicare Observation Status Notification Given:  Yes    Tommy Medal 06/04/2019, 3:24 PM

## 2019-06-04 NOTE — H&P (Signed)
History and Physical    Patient Demographics:    Kenneth Fuller:[id number] DOB: [birth year] DOA: 06/03/2019  PCP: [name], [name], MD  Patient coming from: Home  I have personally briefly reviewed patient's old medical records in [place]  Chief Complaint: Dizziness   Assessment & Plan:     Assessment/Plan Principal Problem:   Dizziness Active Problems:   Diabetes mellitus type II, non insulin dependent ([place])   Gout   Essential hypertension   OSA on CPAP   GERD (gastroesophageal reflux disease)   Hyperlipidemia associated with type 2 diabetes mellitus ([place])   Coronary artery disease involving native coronary artery of native heart without angina pectoris   Chronic diastolic heart failure (HCC)   Depression, recurrent (HCC)   Chronic obstructive pulmonary disease ([place])   Acute renal failure (ARF) (HCC)     Principal Problem: Near Syncope, dizziness and weakness Patient has been having episodes of lightheadedness, dizziness, near syncope over the past 2 weeks.  Appears to be likely secondary to use of diuretics, antihypertensives with worsening renal function, dehydration.   -Telemetry monitoring -Serial troponins -Orthostatic vital signs -IV fluid resuscitation  Other Active Problems: Acute Renal Failure Creatinine 2.26 on presentation.  Baseline appears to be fluctuating between 1.2-1.7.  Most recent creatinine was 1.7 on 05/28/2019.  Likely worsened renal function secondary to dehydration secondary to use of Lasix, Aldactone, lisinopril. -IV fluid resuscitation -Monitor input output, renal function closely  Hypertension Patient apparently has history of uncontrolled hypertension and his antihypertensives were being uptitrated due to uncontrolled hypertension.  He was also on diuretics at home. -Hold antihypertensives  Hyperlipidemia -Continue atorvastatin  Diabetes mellitus type 2 -Sliding scale insulin coverage with fingerstick  monitoring  Chronic diastolic heart failure -Hold Lasix, spironolactone due to worsening renal function  COPD Stable, not in acute exacerbation -Continue Dulera, albuterol as needed  Depression / Anxiety -Continue sertraline, amitriptyline, Klonopin as needed  GERD -Continue PPI  Left-sided lung nodules Patient noted to have noncalcified 4 mm and 6 mm left lower lobe lung nodules on abdominal imaging.  Will need to have CT imaging as outpatient on a routine basis for further evaluation.  Right flank pain, possibly musculoskeletal versus   DVT prophylaxis: heparin Code Status:  Full code Family Communication: N/A  Disposition Plan: Placed in observation for dehydration, near syncope  Consults called: N/A Admission status: observation stay    HPI:     HPI: [name] is a 74 y.o. male with medical history significant of hypertension, hyperlipidemia, diabetes mellitus type 2, gout, chronic congestive heart failure, GERD, obstructive sleep apnea, COPD, depression who presented to the ER with weakness, dizziness.  Patient states he has been having episodes of weakness, dizziness, lightheadedness over the last 2 weeks.  He has a history of uncontrolled hypertension and apparently his medications were being uptitrated over the last 3 to 4 weeks due to significantly elevated blood pressures.  He also has a history of congestive heart failure and was on Lasix 80 mg twice a day due to history of fluid overload which was then cut down to 40 twice a day.  Patient does report having some right flank pain over the last few days. No seizures, blurring of vision, headaches, focal deficits. No hematemesis, melena, hematochezia, dysuria, hematuria. ED Course:  Vital Signs reviewed on presentation, significant for temperature 98.1, heart rate 73, blood pressure 112/63, saturation 98% on room air. Labs reviewed, significant for sodium 134, potassium 5.3, chloride 93, BUN 51,  creatinine  2.26, LFTs within normal limits, lactic acid 1.5, WBC count 10.5, hemoglobin 13.2, hematocrit 41, platelets 247, flu PCR negative, SARS Covid RT-PCR negative, urinalysis negative, alcohol level negative. Imaging personally Reviewed, CT of the abdomen pelvis shows no evidence of renal calculi or hydronephrosis.  Patient has left lower lobe lung nodules.  Multilevel degenerative changes throughout lumbar spine. EKG personally reviewed, shows sinus rhythm, no acute ST-T changes.    Review of systems:    Review of Systems: As per HPI otherwise 10 point review of systems negative.  All other review of systems is negative except the ones noted above in the HPI.    Past Medical and Surgical History:  Reviewed by me  Past Medical History:  Diagnosis Date  . Allergy   . Asthma   . CAD (coronary artery disease)   . CHF (congestive heart failure) ([place])   . Diabetes mellitus   . Fibromyalgia   . GERD (gastroesophageal reflux disease)   . GI bleeding   . Gout   . Hyperlipidemia   . Hypertension   . Hypogonadism male   . Insomnia   . MRSA cellulitis   . Neuropathy   . Obesity   . Shortness of breath dyspnea    with exertion   . Sleep apnea    cpap- 14   . Wheezing    no asthma diagnosis    Past Surgical History:  Procedure Laterality Date  . BACK SURGERY    . CARDIAC CATHETERIZATION    . COLONOSCOPY    . LEFT HEART CATH AND CORONARY ANGIOGRAPHY N/A 12/02/2016   Procedure: LEFT HEART CATH AND CORONARY ANGIOGRAPHY;  Surgeon: Jettie Booze, MD;  Location: [facility];  Service: Cardiovascular;  Laterality: N/A;  . LUMBAR LAMINECTOMY/DECOMPRESSION MICRODISCECTOMY N/A 01/02/2014   Procedure: CENTRAL DECOMPRESSION LUMBAR LAMINECTOMY L3-L4, L4-L5;  Surgeon: Tobi Bastos, MD;  Location: [facility];  Service: Orthopedics;  Laterality: N/A;  . neck fusion       Social History:  Reviewed by me   reports that he quit smoking about 50 years ago. He quit smokeless tobacco use  about 50 years ago. He reports that he does not drink alcohol or use drugs.  Allergies:    Allergies  Allergen Reactions  . Amlodipine Besy-Benazepril Hcl Swelling and Other (See Comments)    Makes tongue swell (lotrel)  . Oxycodone Itching  . Phenergan [Promethazine Hcl] Other (See Comments)    "I can't remember."  . Hydrocodone Itching    Can tolerate in low doses    Family History :   Family History  Problem Relation Age of Onset  . Colon cancer Mother   . Diabetes Father        siblings  . Heart disease Father        brother  . Heart attack Father   . Kidney disease Sister   . Heart failure Sister   . Heart disease Brother   . Heart attack Son 82  . Drug abuse Sister   . Heart disease Brother   . Deep vein thrombosis Brother   . Colon polyps Neg Hx    Family history reviewed, noted as above, not pertinent to current presentation.   Home Medications:    Prior to Admission medications   Medication Sig Start Date End Date Taking? Authorizing Provider  albuterol (ACCUNEB) 1.25 MG/3ML nebulizer solution Take 1 ampule by nebulization every 6 (six) hours as needed for wheezing.    [provider]  albuterol (PROVENTIL HFA;VENTOLIN HFA) 108 (90 Base) MCG/ACT inhaler Inhale 2 puffs into the lungs every 6 (six) hours as needed for wheezing or shortness of breath. 07/20/17   Brand Males, MD  amitriptyline (ELAVIL) 25 MG tablet 1/2 pill each bedtime x 1 week, then 1 pill nightly x 1 week, then 1 1/2 pills nightly x 1 week, then 2 pills nightly thereafter. 03/14/19   Star Age, MD  atorvastatin (LIPITOR) 40 MG tablet Take 1 tablet by mouth once daily 05/14/19   [name], [name], MD  bisoprolol (ZEBETA) 5 MG tablet Take 0.5 tablets (2.5 mg total) by mouth daily. 10/05/18   [name], [name], MD  Blood Pressure Monitoring (BLOOD PRESSURE CUFF) MISC 1 each by Does not apply route daily. 05/22/18   [name], [name], MD  Cholecalciferol (VITAMIN D PO) Take 5,000  Units by mouth every morning.     [provider]  clonazePAM (KLONOPIN) 0.5 MG tablet Take 1 tablet (0.5 mg total) by mouth at bedtime as needed. Patient not taking: Reported on 05/28/2019 10/05/18   [name], [name], MD  dapagliflozin propanediol (FARXIGA) 10 MG TABS tablet Take 10 mg by mouth daily.    [provider]  furosemide (LASIX) 40 MG tablet Take 80 mg (two tablets) twice daily until your weight gets down to 275 lbs then decrease it back to 40 mg twice daily. 04/11/19   Croitoru, Mihai, MD  glucose blood test strip Test BS daily Dx E11.9 05/01/19   [name], [name], MD  hydrALAZINE (APRESOLINE) 25 MG tablet Take 1 tablet (25 mg total) by mouth 3 (three) times daily. 05/28/19   [name], [name], MD  isosorbide dinitrate (ISORDIL) 30 MG tablet TAKE 1 TABLET BY MOUTH THREE TIMES DAILY 05/25/19   [name], [name], MD  Lancets (ACCU-CHEK SOFT TOUCH) lancets 1 each by Other route daily. Use as instructed 04/18/18   [name], [name], MD  lisinopril (ZESTRIL) 20 MG tablet Take 2 tablets (40 mg total) by mouth daily. 04/04/19 07/03/19  Baruch Gouty, FNP  Misc. Devices (PULSE OXIMETER) MISC 1 each by Does not apply route continuous as needed. 07/26/18   [name], [name], MD  mometasone-formoterol (DULERA) 100-5 MCG/ACT AERO Inhale 2 puffs into the lungs 2 (two) times daily.    [provider]  [facility] DELICA LANCETS 28U MISC 1 each by Does not apply route daily. Test 1X per day and prn 03/30/17   [name], [name], MD  OXYGEN Inhale 2 L into the lungs at bedtime. 2lpm with sleep     [provider]  Potassium Chloride ER 20 MEQ TBCR Take 1 tablet by mouth daily. 08/08/18   Almyra Deforest, PA  rizatriptan (MAXALT) 5 MG tablet Take 1 tablet (5 mg total) by mouth as needed for migraine. May repeat in 2 hours if needed 02/15/19   [name], [name], MD  Semaglutide,0.25 or 0.5MG/DOS, (OZEMPIC, 0.25 OR 0.5 MG/DOSE,) 2 MG/1.5ML SOPN Inject 0.5 mg into the skin once  a week. 05/28/19   [name], [name], MD  sertraline (ZOLOFT) 100 MG tablet Take 1 tablet (100 mg total) by mouth daily. 05/28/19   [name], [name], MD  spironolactone (ALDACTONE) 25 MG tablet Take 1 tablet (25 mg total) by mouth daily. 04/11/19 07/10/19  Croitoru, Dani Gobble, MD    Physical Exam:    Physical Exam: Vitals:   06/03/19 2300 06/03/19 2330 06/04/19 0000 06/04/19 0100  BP: 106/64 111/68 100/71 90/61  Pulse:  78 74 78  Resp: 15 (!) _0 Temp:      TempSrc:      SpO2:  96% 97% 95%  Weight:      Height:        Constitutional: NAD, calm, comfortable Vitals:   06/03/19 2300 06/03/19 2330 06/04/19 0000 06/04/19 0100  BP: 106/64 111/68 100/71 90/61  Pulse:  78 74 78  Resp: 15 (!) _1 Temp:      TempSrc:      SpO2:  96% 97% 95%  Weight:      Height:       Eyes: PERRL, lids and conjunctivae normal ENMT: Mucous membranes are moist. Posterior pharynx clear of any exudate or lesions.Normal dentition.  Neck: normal, supple, no masses, no thyromegaly Respiratory: clear to auscultation bilaterally, no wheezing, no crackles. Normal respiratory effort. No accessory muscle use.  Cardiovascular: Regular rate and rhythm, no murmurs / rubs / gallops. No extremity edema. 2+ pedal pulses. No carotid bruits.  Abdomen: Tenderness over the right flank and right paraspinal region., no masses palpated. No hepatosplenomegaly. Bowel sounds positive.  Musculoskeletal: no clubbing / cyanosis. No joint deformity upper and lower extremities. Good ROM, no contractures. Normal muscle tone.  Skin: no rashes, lesions, ulcers. No induration Neurologic: CN 2-12 grossly intact. Sensation intact, DTR normal. Strength 5/5 in all 4.  Psychiatric: Normal judgment and insight. Alert and oriented x 3. Normal mood.    Decubitus Ulcers: Not present on admission Catheters and tubes: None  Data Review:    Labs on Admission: I have personally reviewed following labs and imaging studies  CBC: Recent  Labs  Lab 05/28/19 1055 06/03/19 2137  WBC 8.6 10.5  NEUTROABS 5.9 6.9  HGB 12.9* 13.2  HCT 40.8 41.9  MCV 82 82.8  PLT 238 379   Basic Metabolic Panel: Recent Labs  Lab 05/28/19 1055 06/03/19 2137  NA 138 134*  K 5.1 5.3*  CL 97 93*  CO2 23 29  GLUCOSE 180* 162*  BUN 44* 51*  CREATININE 1.73* 2.26*  CALCIUM 9.7 10.0   GFR: Estimated Creatinine Clearance: 36.9 mL/min (A) (by C-G formula based on SCr of 2.26 mg/dL (H)). Liver Function Tests: Recent Labs  Lab 05/28/19 1055 06/03/19 2137  AST 21 30  ALT 28 41  ALKPHOS 84 79  BILITOT 0.3 0.6  PROT 6.9 7.7  ALBUMIN 4.4 4.1   No results for input(s): LIPASE, AMYLASE in the last 168 hours. No results for input(s): AMMONIA in the last 168 hours. Coagulation Profile: No results for input(s): INR, PROTIME in the last 168 hours. Cardiac Enzymes: No results for input(s): CKTOTAL, CKMB, CKMBINDEX, TROPONINI in the last 168 hours. BNP (last 3 results) No results for input(s): PROBNP in the last 8760 hours. HbA1C: No results for input(s): HGBA1C in the last 72 hours. CBG: No results for input(s): GLUCAP in the last 168 hours. Lipid Profile: No results for input(s): CHOL, HDL, LDLCALC, TRIG, CHOLHDL, LDLDIRECT in the last 72 hours. Thyroid Function Tests: No results for input(s): TSH, T4TOTAL, FREET4, T3FREE, THYROIDAB in the last 72 hours. Anemia Panel: No results for input(s): VITAMINB12, FOLATE, FERRITIN, TIBC, IRON, RETICCTPCT in the last 72 hours. Urine analysis:    Component Value Date/Time   COLORURINE YELLOW 12/26/2015 2033   APPEARANCEUR Clear 05/15/2019 1126   LABSPEC 1.036 (H) 12/26/2015 2033   PHURINE 5.0 12/26/2015 2033   GLUCOSEU Negative 05/15/2019 1126   HGBUR NEGATIVE 12/26/2015 2033   BILIRUBINUR Negative  05/15/2019 [place] 12/26/2015 2033   PROTEINUR Negative 05/15/2019 1126   PROTEINUR NEGATIVE 12/26/2015 2033   UROBILINOGEN 0.2 12/26/2013 1135   NITRITE Negative 05/15/2019  1126   NITRITE NEGATIVE 12/26/2015 2033   LEUKOCYTESUR Negative 05/15/2019 1126     Imaging Results:      Radiological Exams on Admission: DG Chest Port 1 View  Result Date: 06/03/2019 CLINICAL DATA:  Dizziness, hypotension for 2 weeks, nausea EXAM: PORTABLE CHEST 1 VIEW COMPARISON:  04/03/2019 FINDINGS: Single frontal view of the chest demonstrates an unremarkable cardiac silhouette. No airspace disease, effusion, or pneumothorax. No acute bony abnormalities. IMPRESSION: 1. No acute process. Electronically Signed   By: Randa Ngo M.D.   On: 06/03/2019 22:00      [facility] Ginette Otto MD [facility]  If 7PM-7AM, please contact night-coverage   06/04/2019, 1:36 AM

## 2019-06-05 ENCOUNTER — Telehealth: Payer: Self-pay | Admitting: Pharmacist

## 2019-06-05 DIAGNOSIS — R55 Syncope and collapse: Secondary | ICD-10-CM | POA: Diagnosis not present

## 2019-06-05 DIAGNOSIS — E875 Hyperkalemia: Secondary | ICD-10-CM | POA: Diagnosis present

## 2019-06-05 DIAGNOSIS — N179 Acute kidney failure, unspecified: Secondary | ICD-10-CM | POA: Diagnosis present

## 2019-06-05 LAB — BASIC METABOLIC PANEL
Anion gap: 9 (ref 5–15)
BUN: 28 mg/dL — ABNORMAL HIGH (ref 8–23)
CO2: 22 mmol/L (ref 22–32)
Calcium: 8.3 mg/dL — ABNORMAL LOW (ref 8.9–10.3)
Chloride: 103 mmol/L (ref 98–111)
Creatinine, Ser: 1.31 mg/dL — ABNORMAL HIGH (ref 0.61–1.24)
GFR calc Af Amer: >60 mL/min (ref >60)
GFR calc non Af Amer: 54 mL/min — ABNORMAL LOW (ref >60)
Glucose, Bld: 128 mg/dL — ABNORMAL HIGH (ref 70–99)
Potassium: 4.2 mmol/L (ref 3.5–5.1)
Sodium: 134 mmol/L — ABNORMAL LOW (ref 135–145)

## 2019-06-05 LAB — GLUCOSE, CAPILLARY
Glucose-Capillary: 124 mg/dL — ABNORMAL HIGH (ref 70–99)
Glucose-Capillary: 122 mg/dL — ABNORMAL HIGH (ref 70–99)
Glucose-Capillary: 123 mg/dL — ABNORMAL HIGH (ref 70–99)

## 2019-06-05 MED ORDER — POLYETHYLENE GLYCOL 3350 17 G PO PACK: 17.0000 g | PACK | Freq: Every day | ORAL | 0 refills | Status: DC

## 2019-06-05 MED ORDER — ONDANSETRON HCL 4 MG PO TABS: 4.0000 mg | ORAL_TABLET | Freq: Four times a day (QID) | ORAL | 0 refills | Status: DC | PRN

## 2019-06-05 MED ORDER — FUROSEMIDE 40 MG PO TABS: 40.0000 mg | ORAL_TABLET | Freq: Every day | ORAL | 1 refills | Status: DC

## 2019-06-05 MED ORDER — SODIUM CHLORIDE 0.9 % IV BOLUS
500.0000 mL | Freq: Once | INTRAVENOUS | Status: AC
  Administered 2019-06-05: 500 mL via INTRAVENOUS

## 2019-06-05 MED ORDER — ISOSORBIDE DINITRATE 30 MG PO TABS: 30.0000 mg | ORAL_TABLET | Freq: Three times a day (TID) | ORAL | 1 refills | Status: DC

## 2019-06-05 MED ORDER — HYDRALAZINE HCL 25 MG PO TABS: 25.0000 mg | ORAL_TABLET | Freq: Three times a day (TID) | ORAL | 3 refills | Status: DC

## 2019-06-05 NOTE — Discharge Summary (Signed)
Kenneth Fuller, is a 74 y.o. male  DOB 01-Jun-1945  MRN [id number].  Admission date:  06/03/2019  Admitting Physician  Lynetta Mare, MD  Discharge Date:  06/05/2019   Primary MD  [name], [name], MD  Recommendations for primary care physician for things to follow:   1) please Stop Aldactone/spironolactone, Stop lisinopril, please decrease Lasix/furosemide to 40 mg daily 2) your kidney and electrolyte test has improved with medication changes and fluid hydration--- please get repeat BMP/electrolyte test with the primary care physician within a week 3)Avoid ibuprofen/Advil/Aleve/Motrin/Goody Powders/Naproxen/BC powders/Meloxicam/Diclofenac/Indomethacin and other Nonsteroidal anti-inflammatory medications as these will make you more likely to bleed and can cause stomach ulcers, can also cause Kidney problems.  4) okay to take MiraLAX daily to prevent/avoid constipation 5) I recommend compression stockings for your lower extremities to keep fluid down, please put them on in the morning  and take them off at bedtime  Admission Diagnosis  Hyperkalemia [E87.5] Lightheadedness [R42] Renal dysfunction [N28.9] Near syncope [R55]   Discharge Diagnosis  Hyperkalemia [E87.5] Lightheadedness [R42] Renal dysfunction [N28.9] Near syncope [R55]    Principal Problem:   Near syncope Active Problems:   Chronic diastolic heart failure (HCC)   Acute renal failure (ARF) (HCC)   AKI (acute kidney injury) ([place])   Essential hypertension   OSA on CPAP   Hyperkalemia   Diabetes mellitus type II, non insulin dependent (HCC)   Gout   GERD (gastroesophageal reflux disease)   Hyperlipidemia associated with type 2 diabetes mellitus ([place])   Coronary artery disease involving native coronary artery of native heart without angina pectoris   Depression, recurrent (HCC)   Chronic obstructive pulmonary disease (HCC)  Dizziness      Past Medical History:  Diagnosis Date  . Allergy   . Asthma   . CAD (coronary artery disease)   . CHF (congestive heart failure) ([place])   . Diabetes mellitus   . Fibromyalgia   . GERD (gastroesophageal reflux disease)   . GI bleeding   . Gout   . Hyperlipidemia   . Hypertension   . Hypogonadism male   . Insomnia   . MRSA cellulitis   . Neuropathy   . Obesity   . Shortness of breath dyspnea    with exertion   . Sleep apnea    cpap- 14   . Wheezing    no asthma diagnosis    Past Surgical History:  Procedure Laterality Date  . BACK SURGERY    . CARDIAC CATHETERIZATION    . COLONOSCOPY    . LEFT HEART CATH AND CORONARY ANGIOGRAPHY N/A 12/02/2016   Procedure: LEFT HEART CATH AND CORONARY ANGIOGRAPHY;  Surgeon: Jettie Booze, MD;  Location: [facility];  Service: Cardiovascular;  Laterality: N/A;  . LUMBAR LAMINECTOMY/DECOMPRESSION MICRODISCECTOMY N/A 01/02/2014   Procedure: CENTRAL DECOMPRESSION LUMBAR LAMINECTOMY L3-L4, L4-L5;  Surgeon: Tobi Bastos, MD;  Location: [facility];  Service: Orthopedics;  Laterality: N/A;  . neck fusion       HPI  from  the history and physical done on the day of admission:    HPI: Kenneth Fuller is a 74 y.o. male with medical history significant of hypertension, hyperlipidemia, diabetes mellitus type 2, gout, chronic congestive heart failure, GERD, obstructive sleep apnea, COPD, depression who presented to the ER with weakness, dizziness.  Patient states he has been having episodes of weakness, dizziness, lightheadedness over the last 2 weeks.  He has a history of uncontrolled hypertension and apparently his medications were being uptitrated over the last 3 to 4 weeks due to significantly elevated blood pressures.  He also has a history of congestive heart failure and was on Lasix 80 mg twice a day due to history of fluid overload which was then cut down to 40 twice a day.  Patient does report having some right  flank pain over the last few days. No seizures, blurring of vision, headaches, focal deficits. No hematemesis, melena, hematochezia, dysuria, hematuria. ED Course:  Vital Signs reviewed on presentation, significant for temperature 98.1, heart rate 73, blood pressure 112/63, saturation 98% on room air. Labs reviewed, significant for sodium 134, potassium 5.3, chloride 93, BUN 51, creatinine 2.26, LFTs within normal limits, lactic acid 1.5, WBC count 10.5, hemoglobin 13.2, hematocrit 41, platelets 247, flu PCR negative, SARS Covid RT-PCR negative, urinalysis negative, alcohol level negative. Imaging personally Reviewed, CT of the abdomen pelvis shows no evidence of renal calculi or hydronephrosis.  Patient has left lower lobe lung nodules.  Multilevel degenerative changes throughout lumbar spine. EKG personally reviewed, shows sinus rhythm, no acute ST-T changes.   Hospital Course:   Brief Summary:- 74 y.o.malewith medical history significant ofhypertension, hyperlipidemia, diabetes mellitus type 2, gout, chronic congestive heart failure, GERD, obstructive sleep apnea, COPD, depression admitted on 06/04/2019 with dehydration and AKI   A/p Acute Renal Failure Creatinine 2.26 on presentation. Baseline appears to be fluctuating between 1.2-1.7. Most recent creatinine was 1.7 on 05/28/2019. Likely worsened renal function secondary to dehydration secondary to use of Lasix, Aldactone, lisinopril. -IV fluid resuscitation -Creatinine is down to 1.3 with hydration -Discontinue lisinopril, discontinue potassium supplementation, discontinue Aldactone, -Okay to resume Lasix at 40 mg daily  Constipation/Obstipation--- please see abdominal x-rays from 05/15/2019 with significant amount of stool throughout the colon----clinically patient has continued to struggle with significant constipation for more than 3 weeks despite trying prune juice and OTC laxatives at home -Resolved with lactulose and  Dulcolax -Be judicious with opiates and muscle relaxants  Hypertension -Restart hydralazine/isosorbide -Lisinopril and Aldactone discontinued due to AKI and hyperkalemia -Lasix decreased to 40 mg daily  Hyperlipidemia -Continue atorvastatin  Diabetes mellitus type 2 -Restart Farxiga and Ozempic  Chronic diastolic heart failure -Last known EF 55%, -Patient does not appear volume overloaded in fact he was dehydrated on admission and required IV fluids --Lisinopril and Aldactone discontinued due to AKI and hyperkalemia -Lasix decreased to 40 mg daily  COPD Stable, not in acute exacerbation -Continue Dulera, albuterol as needed  Depression /Anxiety -Continue sertraline, Klonopin as needed  GERD -Continue PPI  Left-sided lung nodules Patient noted to have noncalcified 4 mm and 6 mm left lower lobe lung nodules on abdominal imaging. Will need to have CT imaging as outpatient on a routine basis for further evaluation.  Right flank pain, possibly musculoskeletal   Discharge Condition: Stable  Follow UP--- PCP as advised  Consults obtained - na  Diet and Activity recommendation:  As advised  Discharge Instructions    Discharge Instructions    Call MD for:  difficulty breathing, headache  or visual disturbances   Complete by: As directed    Call MD for:  extreme fatigue   Complete by: As directed    Call MD for:  persistant dizziness or light-headedness   Complete by: As directed    Call MD for:  persistant nausea and vomiting   Complete by: As directed    Call MD for:  severe uncontrolled pain   Complete by: As directed    Call MD for:  temperature >100.4   Complete by: As directed    Diet - low sodium heart healthy   Complete by: As directed    Diet Carb Modified   Complete by: As directed    Discharge instructions   Complete by: As directed    1) please Stop Aldactone/spironolactone, Stop lisinopril, please decrease Lasix/furosemide to 40 mg  daily 2) your kidney and electrolyte test has improved with medication changes and fluid hydration--- please get repeat BMP/electrolyte test with the primary care physician within a week 3)Avoid ibuprofen/Advil/Aleve/Motrin/Goody Powders/Naproxen/BC powders/Meloxicam/Diclofenac/Indomethacin and other Nonsteroidal anti-inflammatory medications as these will make you more likely to bleed and can cause stomach ulcers, can also cause Kidney problems.  4) okay to take MiraLAX daily to prevent/avoid constipation 5) I recommend compression stockings for your lower extremities to keep fluid down, please put them on in the morning  and take them off at bedtime   Increase activity slowly   Complete by: As directed        Discharge Medications     Allergies as of 06/05/2019      Reactions   Amlodipine Besy-benazepril Hcl Swelling, Other (See Comments)   Makes tongue swell (lotrel)   Oxycodone Itching   Phenergan [promethazine Hcl] Other (See Comments)   "I can't remember."   Hydrocodone Itching   Can tolerate in low doses      Medication List    STOP taking these medications   accu-chek soft touch lancets   amitriptyline 25 MG tablet Commonly known as: ELAVIL   Blood Pressure Cuff Misc   clonazePAM 0.5 MG tablet Commonly known as: KLONOPIN   glucose blood test strip   lisinopril 20 MG tablet Commonly known as: ZESTRIL   OneTouch Delica Lancets 37C Misc   Potassium Chloride ER 20 MEQ Tbcr   Pulse Oximeter Misc   rizatriptan 5 MG tablet Commonly known as: Maxalt   spironolactone 25 MG tablet Commonly known as: ALDACTONE     TAKE these medications   albuterol 1.25 MG/3ML nebulizer solution Commonly known as: ACCUNEB Take 1 ampule by nebulization every 6 (six) hours as needed for wheezing.   albuterol 108 (90 Base) MCG/ACT inhaler Commonly known as: VENTOLIN HFA Inhale 2 puffs into the lungs every 6 (six) hours as needed for wheezing or shortness of breath.    atorvastatin 40 MG tablet Commonly known as: LIPITOR Take 1 tablet by mouth once daily   bisoprolol 5 MG tablet Commonly known as: ZEBETA Take 0.5 tablets (2.5 mg total) by mouth daily.   Farxiga 5 MG Tabs tablet Generic drug: dapagliflozin propanediol Take 5 mg by mouth daily.   furosemide 40 MG tablet Commonly known as: LASIX Take 1 tablet (40 mg total) by mouth daily. What changed:   how much to take  how to take this  when to take this  additional instructions   hydrALAZINE 25 MG tablet Commonly known as: APRESOLINE Take 1 tablet (25 mg total) by mouth 3 (three) times daily. What changed: additional instructions   isosorbide  dinitrate 30 MG tablet Commonly known as: ISORDIL Take 1 tablet (30 mg total) by mouth 3 (three) times daily.   mometasone-formoterol 100-5 MCG/ACT Aero Commonly known as: DULERA Inhale 2 puffs into the lungs 2 (two) times daily.   ondansetron 4 MG tablet Commonly known as: ZOFRAN Take 1 tablet (4 mg total) by mouth every 6 (six) hours as needed for nausea.   OXYGEN Inhale 2 L into the lungs at bedtime. 2lpm with sleep   Ozempic (0.25 or 0.5 MG/DOSE) 2 MG/1.5ML Sopn Generic drug: Semaglutide(0.25 or 0.5MG/DOS) Inject 0.5 mg into the skin once a week.   polyethylene glycol 17 g packet Commonly known as: MIRALAX / GLYCOLAX Take 17 g by mouth daily. Start taking on: June 07, 2019   sertraline 100 MG tablet Commonly known as: ZOLOFT Take 1 tablet (100 mg total) by mouth daily.   tamsulosin 0.4 MG Caps capsule Commonly known as: FLOMAX Take 0.4 mg by mouth daily.   VITAMIN D PO Take 5,000 Units by mouth every morning.      Major procedures and Radiology Reports - PLEASE review detailed and final reports for all details, in brief -   CT Abdomen Pelvis Wo Contrast  Result Date: 06/04/2019 CLINICAL DATA:  Right-sided flank pain. EXAM: CT ABDOMEN AND PELVIS WITHOUT CONTRAST TECHNIQUE: Multidetector CT imaging of the abdomen and  pelvis was performed following the standard protocol without IV contrast. COMPARISON:  March 12, 2013 FINDINGS: Lower chest: A 6 mm noncalcified lung nodule is seen within the anterior aspect of the left lower lobe. A 4 mm noncalcified lung nodule is seen within the posterolateral aspect of the left lung base. Hepatobiliary: No focal liver abnormality is seen. No gallstones, gallbladder wall thickening, or biliary dilatation. Pancreas: Unremarkable. No pancreatic ductal dilatation or surrounding inflammatory changes. Spleen: Punctate calcified granulomas are seen within an otherwise normal-appearing spleen. Adrenals/Urinary Tract: Adrenal glands are unremarkable. Kidneys are normal, without renal calculi, focal lesion, or hydronephrosis. Bladder is unremarkable. Stomach/Bowel: Stomach is within normal limits. Appendix appears normal. No evidence of bowel wall thickening, distention, or inflammatory changes. Vascular/Lymphatic: Moderate to marked severity calcification of the abdominal aorta. No enlarged abdominal or pelvic lymph nodes. Reproductive: Prostate is unremarkable. Other: No abdominal wall hernia or abnormality. No abdominopelvic ascites. Musculoskeletal: Multilevel degenerative changes seen throughout the lumbar spine with approximately 3 mm retrolisthesis of the L3 vertebral body on L4. IMPRESSION: 1. No evidence of renal calculi or hydronephrosis. 2. 4 mm and 6 mm noncalcified left lower lobe lung nodules. Correlation with chest CT is recommended. 3. Multilevel degenerative changes throughout the lumbar spine with approximately 3 mm retrolisthesis of the L3 vertebral body on L4. Aortic Atherosclerosis (ICD10-I70.0). Electronically Signed   By: Virgina Norfolk M.D.   On: 06/04/2019 01:57   DG Abd 1 View  Result Date: 05/15/2019 CLINICAL DATA:  Right flank pain. EXAM: ABDOMEN - 1 VIEW COMPARISON:  Abdominal CT 03/12/2013 and abdominal ultrasound 02/28/2018 FINDINGS: No suspicious calcifications are  seen over the kidneys or expected course of the ureters. Left pelvic calcifications are grossly stable and correspond with phleboliths on prior CT. There is prominent stool throughout the colon. The bowel gas pattern is nonobstructive. Degenerative and possible postsurgical changes in the spine. IMPRESSION: No radiographic evidence of urinary tract calculus. Prominent stool throughout the colon. Electronically Signed   By: Richardean Sale M.D.   On: 05/15/2019 16:21   DG Chest Port 1 View  Result Date: 06/03/2019 CLINICAL DATA:  Dizziness, hypotension for 2  weeks, nausea EXAM: PORTABLE CHEST 1 VIEW COMPARISON:  04/03/2019 FINDINGS: Single frontal view of the chest demonstrates an unremarkable cardiac silhouette. No airspace disease, effusion, or pneumothorax. No acute bony abnormalities. IMPRESSION: 1. No acute process. Electronically Signed   By: Randa Ngo M.D.   On: 06/03/2019 22:00    Micro Results   Recent Results (from the past 240 hour(s))  Respiratory Panel by RT PCR (Flu A&B, Covid) - Nasopharyngeal Swab     Status: None   Collection Time: 06/04/19 12:30 AM   Specimen: Nasopharyngeal Swab  Result Value Ref Range Status   SARS Coronavirus 2 by RT PCR NEGATIVE NEGATIVE Final    Comment: (NOTE) SARS-CoV-2 target nucleic acids are NOT DETECTED. The SARS-CoV-2 RNA is generally detectable in upper respiratoy specimens during the acute phase of infection. The lowest concentration of SARS-CoV-2 viral copies this assay can detect is 131 copies/mL. A negative result does not preclude SARS-Cov-2 infection and should not be used as the sole basis for treatment or other patient management decisions. A negative result may occur with  improper specimen collection/handling, submission of specimen other than nasopharyngeal swab, presence of viral mutation(s) within the areas targeted by this assay, and inadequate number of viral copies (<131 copies/mL). A negative result must be combined with  clinical observations, patient history, and epidemiological information. The expected result is Negative. Fact Sheet for Patients:  PinkCheek.be Fact Sheet for Healthcare Providers:  GravelBags.it This test is not yet ap proved or cleared by the [place] FDA and  has been authorized for detection and/or diagnosis of SARS-CoV-2 by FDA under an Emergency Use Authorization (EUA). This EUA will remain  in effect (meaning this test can be used) for the duration of the COVID-19 declaration under Section 564(b)(1) of the Act, 21 U.S.C. section 360bbb-3(b)(1), unless the authorization is terminated or revoked sooner.    Influenza A by PCR NEGATIVE NEGATIVE Final   Influenza B by PCR NEGATIVE NEGATIVE Final    Comment: (NOTE) The Xpert Xpress SARS-CoV-2/FLU/RSV assay is intended as an aid in  the diagnosis of influenza from Nasopharyngeal swab specimens and  should not be used as a sole basis for treatment. Nasal washings and  aspirates are unacceptable for Xpert Xpress SARS-CoV-2/FLU/RSV  testing. Fact Sheet for Patients: PinkCheek.be Fact Sheet for Healthcare Providers: GravelBags.it This test is not yet approved or cleared by the [place] FDA and  has been authorized for detection and/or diagnosis of SARS-CoV-2 by  FDA under an Emergency Use Authorization (EUA). This EUA will remain  in effect (meaning this test can be used) for the duration of the  Covid-19 declaration under Section 564(b)(1) of the Act, 21  U.S.C. section 360bbb-3(b)(1), unless the authorization is  terminated or revoked. Performed at [facility], [address] [place], [place] [address]    Today   Subjective    Kenneth Fuller today has no new complaints Voiding well -Ambulating around the room without significant dizziness or dyspnea on exertion  -Patient's daughter Lattie Haw on the  phone with questions         Patient has been seen and examined prior to discharge   Objective   Blood pressure 123/77, pulse 78, temperature 97.6 F (36.4 C), temperature source Oral, resp. rate 20, height _0  (1.702 m), weight 127.6 kg, SpO2 97 %.   Intake/Output Summary (Last 24 hours) at 06/05/2019 1652 Last data filed at 06/05/2019 1200 Gross per 24 hour  Intake 240 ml  Output --  Net  240 ml    Exam Gen:- Awake Alert, no acute distress .  Morbidly obese, no conversational dyspnea even post ambulation HEENT:- [place].AT, No sclera icterus Neck-Supple Neck,No JVD,.  Lungs-  CTAB , good air movement bilaterally  CV- S1, S2 normal, regular Abd-  +ve B.Sounds, Abd Soft, No tenderness, increased truncal adiposity extremity/Skin:- No  edema,   good pulses Psych-affect is appropriate, oriented x3 Neuro-no new focal deficits, no tremors    Data Review   CBC w Diff:  Lab Results  Component Value Date   WBC 10.4 06/04/2019   HGB 12.0 (L) 06/04/2019   HGB 12.9 (L) 05/28/2019   HCT 38.6 (L) 06/04/2019   HCT 40.8 05/28/2019   PLT 215 06/04/2019   PLT 238 05/28/2019   LYMPHOPCT 23 06/03/2019   MONOPCT 8 06/03/2019   EOSPCT 3 06/03/2019   BASOPCT 1 06/03/2019    CMP:  Lab Results  Component Value Date   NA 134 (L) 06/05/2019   NA 138 05/28/2019   K 4.2 06/05/2019   CL 103 06/05/2019   CO2 22 06/05/2019   BUN 28 (H) 06/05/2019   BUN 44 (H) 05/28/2019   CREATININE 1.31 (H) 06/05/2019   CREATININE 1.04 06/12/2012   PROT 7.7 06/03/2019   PROT 6.9 05/28/2019   ALBUMIN 4.1 06/03/2019   ALBUMIN 4.4 05/28/2019   BILITOT 0.6 06/03/2019   BILITOT 0.3 05/28/2019   ALKPHOS 79 06/03/2019   AST 30 06/03/2019   ALT 41 06/03/2019  .   Total Discharge time is about 33 minutes  Roxan Hockey M.D on 06/05/2019 at 4:52 PM  Go to [email or web address] -  for contact info  [facility] - Office  [phone]

## 2019-06-05 NOTE — Telephone Encounter (Signed)
A/P:  T2DMlongstanding. Patient is able to verbalize appropriate hypoglycemia management plan. Patient adherent with medication MOST of the time.He has periods of time where he is not keen on taking medication and stops. Control is suboptimal due todiet/lifestyle.He states the holidays have been difficult to manage his BG due to his eating choices.  -Decrease Farxiga to 32m daily.  GFR 538-->45-->36Application pending to [place] for Farxiga/Symbicort             FBG have improved to 150-160s  -Continue Ozempic 0.28mweekly, titrate to 0.60m24mn 2 weeks  -Next A1C anticipatednext PCP visit  - [facility].  Blood pressure has improved   Follow up with PharmD upon discharge  JulRegina EckharmD, BCPS Clinical Pharmacist, [facility] Phone [phone]

## 2019-06-05 NOTE — Progress Notes (Signed)
IV and tele removed. D/C instructions reviewed with patient, verbalized understanding. Patient to be transported to private vehicle via wheelchair. Will continue to monitor.

## 2019-06-05 NOTE — Progress Notes (Signed)
Reports 5 or six small, clear bms

## 2019-06-06 ENCOUNTER — Telehealth: Payer: Self-pay | Admitting: *Deleted

## 2019-06-06 NOTE — Telephone Encounter (Signed)
Transitional Care Management  Contact Attempt Attempt Date:06/06/2019 Attempted By: Cleda Clarks Keiarah Orlowski, Lpn   1st unsuccessful TCM contact attempt.   I reached out to [name] on his preferred telephone number to discuss Transitional Care Management, medication reconciliation, and to schedule a TCM hospital follow-up with his PCP at [facility].  Discharge Date: 06/05/19  Location: [facility]  Discharge Dx: dizziness and giddiness  Recommendations for Outpatient Follow-up:   (insert from discharge summary) Recommendations for primary care physician for things to follow:   1) please Stop Aldactone/spironolactone, Stop lisinopril, please decrease Lasix/furosemide to 40 mg daily 2) your kidney and electrolyte test has improved with medication changes and fluid hydration--- please get repeat BMP/electrolyte test with the primary care physician within a week 3)Avoid ibuprofen/Advil/Aleve/Motrin/Goody Powders/Naproxen/BC powders/Meloxicam/Diclofenac/Indomethacin and other Nonsteroidal anti-inflammatory medications as these will make you more likely to bleed and can cause stomach ulcers, can also cause Kidney problems.  4) okay to take MiraLAX daily to prevent/avoid constipation 5) I recommend compression stockings for your lower extremities to keep fluid down, please put them on in the morning  and take them off at bedtime  Plan I left a HIPPA compliant message for him to return my call.  Will attempt to contact again within the 2 business day post discharge window if he does not return my call.

## 2019-06-06 NOTE — Telephone Encounter (Signed)
TRANSITIONAL CARE MANAGEMENT TELEPHONE OUTREACH NOTE   Contact Date: 06/06/2019 Contacted By: Zannie Cove, LPN    DISCHARGE INFORMATION Date of Discharge:06/05/19 Discharge Facility: [facility]  Principal Discharge Diagnosis:Dizziness and giddiness    Outpatient Follow Up Recommendations (copied from discharge summary)Recommendations for primary care physician for things to follow:   1) please Stop Aldactone/spironolactone, Stop lisinopril, please decrease Lasix/furosemide to 40 mg daily 2) your kidney and electrolyte test has improved with medication changes and fluid hydration--- please get repeat BMP/electrolyte test with the primary care physician within a week 3)Avoid ibuprofen/Advil/Aleve/Motrin/Goody Powders/Naproxen/BC powders/Meloxicam/Diclofenac/Indomethacin and other Nonsteroidal anti-inflammatory medications as these will make you more likely to bleed and can cause stomach ulcers, can also cause Kidney problems.  4) okay to take MiraLAX daily to prevent/avoid constipation 5) I recommend compression stockings for your lower extremities to keep fluid down, please put them on in the morning  and take them off at bedtime   Kenneth Fuller is a male primary care patient of [name], [name], MD. An outgoing telephone call was made today and I spoke with patient.  Kenneth Fuller condition(s) and treatment(s) were discussed. An opportunity to ask questions was provided and all were answered or forwarded as appropriate.    ACTIVITIES OF DAILY LIVING  Kenneth Fuller lives with his family and he can perform ADLs independently. his primary caregiver is himself. he is able to depend on his primary caregiver(s) for consistent help. Transportation to appointments, to pick up medications, and to run errands is not a problem.  (Consider referral to [facility] if transportation or a consistent caregiver is a problem)   Fall Risk Fall Risk  05/28/2019 05/15/2019  Falls in the  past year? 1 0  Number falls in past yr: 1 -  Injury with Fall? 0 -  Comment - -  Risk for fall due to : History of fall(s) -  Follow up Falls evaluation completed -    low [place] Modifications/Assistive Devices Wheelchair: No Cane: No Ramp: No Bedside Toilet: No Hospital Bed:  No Other:    [place] he is receiving home health Nursing services.  Patient states that a Male RN from [facility] comes out to his house 2-3 times /per year.   MEDICATION RECONCILIATION  Kenneth Fuller has been able to pick-up all prescribed discharge medications from the pharmacy.   A post discharge medication reconciliation was performed and the complete medication list was reviewed with the patient/caregiver and is current as of 06/06/2019. Changes highlighted below.  Discontinued Medications To be discussed at [facility] with Dr [name]  Current Medication List Allergies as of 06/06/2019      Reactions   Amlodipine Besy-benazepril Hcl Swelling, Other (See Comments)   Makes tongue swell (lotrel)   Oxycodone Itching   Phenergan [promethazine Hcl] Other (See Comments)   "I can't remember."   Hydrocodone Itching   Can tolerate in low doses      Medication List       Accurate as of June 06, 2019  8:55 AM. If you have any questions, ask your nurse or doctor.        albuterol 1.25 MG/3ML nebulizer solution Commonly known as: ACCUNEB Take 1 ampule by nebulization every 6 (six) hours as needed for wheezing.   albuterol 108 (90 Base) MCG/ACT inhaler Commonly known as: VENTOLIN HFA Inhale 2 puffs into the lungs every 6 (six) hours as needed for wheezing or shortness of breath.  atorvastatin 40 MG tablet Commonly known as: LIPITOR Take 1 tablet by mouth once daily   bisoprolol 5 MG tablet Commonly known as: ZEBETA Take 0.5 tablets (2.5 mg total) by mouth daily.   Farxiga 5 MG Tabs tablet Generic drug: dapagliflozin propanediol Take 5 mg by mouth daily.   furosemide 40  MG tablet Commonly known as: LASIX Take 1 tablet (40 mg total) by mouth daily.   hydrALAZINE 25 MG tablet Commonly known as: APRESOLINE Take 1 tablet (25 mg total) by mouth 3 (three) times daily.   isosorbide dinitrate 30 MG tablet Commonly known as: ISORDIL Take 1 tablet (30 mg total) by mouth 3 (three) times daily.   mometasone-formoterol 100-5 MCG/ACT Aero Commonly known as: DULERA Inhale 2 puffs into the lungs 2 (two) times daily.   ondansetron 4 MG tablet Commonly known as: ZOFRAN Take 1 tablet (4 mg total) by mouth every 6 (six) hours as needed for nausea.   OXYGEN Inhale 2 L into the lungs at bedtime. 2lpm with sleep   Ozempic (0.25 or 0.5 MG/DOSE) 2 MG/1.5ML Sopn Generic drug: Semaglutide(0.25 or 0.5MG/DOS) Inject 0.5 mg into the skin once a week.   polyethylene glycol 17 g packet Commonly known as: MIRALAX / GLYCOLAX Take 17 g by mouth daily. Start taking on: June 07, 2019   sertraline 100 MG tablet Commonly known as: ZOLOFT Take 1 tablet (100 mg total) by mouth daily.   tamsulosin 0.4 MG Caps capsule Commonly known as: FLOMAX Take 0.4 mg by mouth daily.   VITAMIN D PO Take 5,000 Units by mouth every morning.        PATIENT EDUCATION & FOLLOW-UP PLAN  An appointment for Transitional Care Management is scheduled with [name], [name], MD on 06/14/19 at 355pm.  Take all medications as prescribed  Contact our office by calling [phone] if you have any questions or concerns

## 2019-06-07 ENCOUNTER — Ambulatory Visit (INDEPENDENT_AMBULATORY_CARE_PROVIDER_SITE_OTHER): Payer: PPO | Admitting: Pharmacist

## 2019-06-07 ENCOUNTER — Other Ambulatory Visit: Payer: Self-pay

## 2019-06-07 VITALS — BP 140/80 | HR 85

## 2019-06-07 DIAGNOSIS — E119 Type 2 diabetes mellitus without complications: Secondary | ICD-10-CM | POA: Diagnosis not present

## 2019-06-07 NOTE — Progress Notes (Signed)
Pharmacy Clinic-Diabetes  06/07/2019 Name: Kenneth Fuller MRN: [id number] DOB: [birth year]  Referred by: [name], [name], MD Reason for referral : Diabetes  S:    66 yoM presentsto clinicfor diabetes evaluation, education, and management s/p hospitalization. PMH significant for: asthma, CAD, CHF, DM, FM, GERD, HLD, HTN, OSA. Patient was referred on4/6/21 by [facility] RN CM, [name]. Patient was last seen by Primary Care Provider on 04/11/19.  Family/Social History:Family Hx: DM, HTN, CHF in father, died at 75 from MI; mother had hx of cancer, died at 25; 46 sisters, 5 bothers - 5 deceased - 1 from CHF, 2 others probable; 3 children 1 son with hypertension at 37  Insurance coverage/medication affordability:[employer] Medicare-->fixed income, brand name copays difficult  PatientMOSTLYadherentwith medications.  Current diabetes medications include:Farxiga 67m daily,ozempic 0.223mweekly Current hypertension medications include:bisoprolol 2.7m6maily, lisinopril 30m67mily (holding), hydralazine 50mg60m, spironolactone 27mg 6my (holding), isosorbide 30mg T34murrent hyperlipidemia medications include:atorvastatin 30mg qH4matientreportshypoglycemic events. These events are occurring once weekly on 3x daily glimepiride.  Patient reported dietary habits: Eats3meals/d33m            Breakfast:slim fast shakes vs eggs/bacone/bread, coffee             Lunch:slim fast shakes vs burger/sandwiches/carb side             Dinner:incorporates meat, carb, veggie             Drinks:coffee, tea, water--counseled to avoid sugary  drinks, etc  Patient-reported exercise habits:n/a--encouraged exercise/walking now that weather is pleasant  Patientreportsnocturia (nighttime urination)--influenced by newly started Flomax, counseled patient to take in the AM, avoid night time excessive drinking, works to limit fluid intake due to HF.   Patientreportsneuropathy (nerve pain).  Patientreportsvisual changes.  PatientdeBrandonms.  O:   Lab Results  Component Value Date   HGBA1C 7.7 (H) 05/28/2019    Lipid Panel     Component Value Date/Time   CHOL 81 (L) 05/28/2019 1055   CHOL 111 06/12/2012 1232   TRIG 95 05/28/2019 1055   TRIG 141 01/24/2014 1143   TRIG 192 (H) 06/12/2012 1232   HDL 39 (L) 05/28/2019 1055   HDL 38 (L) 01/24/2014 1143   HDL 33 (L) 06/12/2012 1232   CHOLHDL 2.1 05/28/2019 1055   CHOLHDL 4.8 11/22/2006 1712   VLDL 39 11/22/2006 1712   LDLCALC 23 05/28/2019 1055   LDLCALC 27 10/25/2013 1224   LDLCALC 40 06/12/2012 1232     Home fasting blood sugars: 130-150   A/P:  Diabetes longstanding T2DM currently uncontrolled A1c of 7.7% (improved). Patient is able to verbalize appropriate hypoglycemia management plan. Patient is adherent with medication. Control is suboptimal due to dietary/lifestyle.  -Continued GLP-1 Ozempic (generic name: semaglutide) 0.27mg week66m Instructed patient to resume dose on Monday (q week schedule).  Will increase to 0.7mg weekly7m 2 weeks  -Decreased dose of SGLT2-I Farxiga (generic name: dapagliflozin) to 7mg daily. 8mnseled on sick day rules for patient to hold medication (if not eating or vomiting).  -Extensively discussed pathophysiology of diabetes, recommended lifestyle interventions, dietary effects on blood sugar control  -Discussused hospital discharge papers and medications  Resume lasix 30mg daily (47mot taking)  Hold lisinopril/spironolactone  Monitor fluid/BP  Patient has not stopped taking amitriptyline  Has not had BM since hospital (2 days)-encourage patient to call if  BM does not come in the next fer days--encouraged  miralax/sennekotS PRN for BM  -Counseled on s/sx of and management  of hypoglycemia  -Next A1C anticipated 08/2019      Written patient instructions provided.  Total time in face to face counseling 22  minutes.   Follow up PCP Clinic Visit next weel.       Regina Eck, PharmD, BCPS Clinical Pharmacist, [place]  II Phone [phone]

## 2019-06-08 ENCOUNTER — Ambulatory Visit: Payer: Self-pay | Admitting: Licensed Clinical Social Worker

## 2019-06-08 ENCOUNTER — Encounter: Payer: Self-pay | Admitting: Pharmacist

## 2019-06-08 DIAGNOSIS — I5042 Chronic combined systolic (congestive) and diastolic (congestive) heart failure: Secondary | ICD-10-CM

## 2019-06-08 DIAGNOSIS — J441 Chronic obstructive pulmonary disease with (acute) exacerbation: Secondary | ICD-10-CM

## 2019-06-08 DIAGNOSIS — I2510 Atherosclerotic heart disease of native coronary artery without angina pectoris: Secondary | ICD-10-CM

## 2019-06-08 DIAGNOSIS — N183 Chronic kidney disease, stage 3 unspecified: Secondary | ICD-10-CM

## 2019-06-08 DIAGNOSIS — E119 Type 2 diabetes mellitus without complications: Secondary | ICD-10-CM

## 2019-06-08 DIAGNOSIS — F418 Other specified anxiety disorders: Secondary | ICD-10-CM

## 2019-06-08 NOTE — Patient Instructions (Addendum)
Licensed Clinical Social Worker Visit Information  Goals we discussed today:       . Client has stress related to ongoing health issues and wishes to talk more about stress over current health issues (pt-stated)     Current Barriers:  [name] Mental Health Concerns  in patient with Chronic Diagnoses of CAD, DM Type II, Depression,recurrent, CKD, COPD and CHF .  Challenges related to managing health issues of client   Clinical Social Work Clinical Goal(s):  [name] Over the next 30 days, client will work with LCSW to address concerns related to health issues of client and client management of health issues faced  Interventions: Talked with client about pain issues of client  Talked with client about breathing issues of client  Talked with client about his current needs  Talked with client about sleeping challenges of client  Talked with client about his recent hospitalization  Talked with client about his decreased appetite  Talked with client about his decreased energy, fatigue  Talked with client about his upcoming medical appointments  Patient Self Care Activities:  . Self administers medications as prescribed . Attends all scheduled provider appointments . Performs ADL's independently   Plan:  LCSW to call client in next 4 weeks to talk with client about stress issues related to his management of health issues faced Client to communicate with RNCM to discuss nursing needs of client Client to attend scheduled client medical appointments   Initial goal documentation       Materials Provided: No  Follow Up Plan: LCSW to call client in next 4 weeks to talk with client about stress issues related to management of health issues faced  The patient verbalized understanding of instructions provided today and declined a print copy of patient instruction materials.   [name].Daymien Goth MSW, LCSW Licensed Clinical Social Worker [facility]  [facility] [phone]

## 2019-06-08 NOTE — Chronic Care Management (AMB) (Signed)
Chronic Care Management    Clinical Social Work Follow Up Note  06/08/2019 Name: Kenneth Fuller MRN: [id number] DOB: 1945/10/20  Kenneth Fuller is a 74 y.o. year old male who is a primary care patient of [name], [name], MD. The CCM team was consulted for assistance with [name] .   Review of patient status, including review of consultants reports, other relevant assessments, and collaboration with appropriate care team members and the patient's provider was performed as part of comprehensive patient evaluation and provision of chronic care management services.    SDOH (Social Determinants of Health) assessments performed: Yes;risk for tobacco use; risk for depression; risk for physical inactivity    Office Visit from 05/28/2019 in [facility]  PHQ-9 Total Score  10     GAD 7 : Generalized Anxiety Score 05/28/2019 11/11/2017  Nervous, Anxious, on Edge 0 3  Control/stop worrying 2 3  Worry too much - different things 2 3  Trouble relaxing 2 3  Restless 0 3  Easily annoyed or irritable 2 3  Afraid - awful might happen 0 0  Total GAD 7 Score 8 18  Anxiety Difficulty Somewhat difficult Somewhat difficult    Outpatient Encounter Medications as of 06/08/2019  Medication Sig Note  . albuterol (ACCUNEB) 1.25 MG/3ML nebulizer solution Take 1 ampule by nebulization every 6 (six) hours as needed for wheezing.   [name] albuterol (PROVENTIL HFA;VENTOLIN HFA) 108 (90 Base) MCG/ACT inhaler Inhale 2 puffs into the lungs every 6 (six) hours as needed for wheezing or shortness of breath.   [name] atorvastatin (LIPITOR) 40 MG tablet Take 1 tablet by mouth once daily   . bisoprolol (ZEBETA) 5 MG tablet Take 0.5 tablets (2.5 mg total) by mouth daily.   . Cholecalciferol (VITAMIN D PO) Take 5,000 Units by mouth every morning.    . dapagliflozin propanediol (FARXIGA) 5 MG TABS tablet Take 5 mg by mouth daily.   . furosemide (LASIX) 40 MG tablet Take 1 tablet (40 mg  total) by mouth daily.   . hydrALAZINE (APRESOLINE) 25 MG tablet Take 1 tablet (25 mg total) by mouth 3 (three) times daily.   . isosorbide dinitrate (ISORDIL) 30 MG tablet Take 1 tablet (30 mg total) by mouth 3 (three) times daily.   . mometasone-formoterol (DULERA) 100-5 MCG/ACT AERO Inhale 2 puffs into the lungs 2 (two) times daily. 05/17/2019: [employer] Patient assistance  . ondansetron (ZOFRAN) 4 MG tablet Take 1 tablet (4 mg total) by mouth every 6 (six) hours as needed for nausea.   . OXYGEN Inhale 2 L into the lungs at bedtime. 2lpm with sleep    . polyethylene glycol (MIRALAX / GLYCOLAX) 17 g packet Take 17 g by mouth daily.   . Semaglutide,0.25 or 0.5MG/DOS, (OZEMPIC, 0.25 OR 0.5 MG/DOSE,) 2 MG/1.5ML SOPN Inject 0.5 mg into the skin once a week.   . sertraline (ZOLOFT) 100 MG tablet Take 1 tablet (100 mg total) by mouth daily.   . tamsulosin (FLOMAX) 0.4 MG CAPS capsule Take 0.4 mg by mouth daily.    No facility-administered encounter medications on file as of 06/08/2019.    Goals    . Client has stress related to ongoing health issues and wishes to talk more about stress over current health issues (pt-stated)     Current Barriers:  [name] Mental Health Concerns  in patient with Chronic Diagnoses of CAD, DM Type II, Depression,recurrent, CKD, COPD and CHF .  Challenges related to managing health issues of  client   Clinical Social Work Clinical Goal(s):  [name] Over the next 30 days, client will work with LCSW to address concerns related to health issues of client and client management of health issues faced  Interventions:  Talked with client about pain issues of client   Talked with client about breathing issues of client  Talked with client about his current needs  Talked with client about sleeping challenges of client  Talked with client about his recent hospitalization  Talked with client about his decreased appetite  Talked with client about his decreased energy, fatigue  Talked  with client about his upcoming medical appointments  Patient Self Care Activities:  . Self administers medications as prescribed . Attends all scheduled provider appointments . Performs ADL's independently   Plan:  LCSW to call client in next 4 weeks to talk with client about stress issues related to his management of health issues faced Client to communicate with RNCM to discuss nursing needs of client Client to attend scheduled client medical appointments   Initial goal documentation        Follow Up Plan: LCSW to call client in next 4 weeks to talk with him about stress issues related to management of health issues faced  [name].Navi Ewton MSW, LCSW Licensed Clinical Social Worker [facility] [phone]

## 2019-06-11 ENCOUNTER — Telehealth: Payer: Self-pay | Admitting: Family Medicine

## 2019-06-11 ENCOUNTER — Ambulatory Visit: Payer: PPO | Admitting: Adult Health

## 2019-06-11 DIAGNOSIS — R0600 Dyspnea, unspecified: Secondary | ICD-10-CM | POA: Diagnosis not present

## 2019-06-11 DIAGNOSIS — I5032 Chronic diastolic (congestive) heart failure: Secondary | ICD-10-CM | POA: Diagnosis not present

## 2019-06-11 DIAGNOSIS — R0902 Hypoxemia: Secondary | ICD-10-CM | POA: Diagnosis not present

## 2019-06-11 NOTE — Telephone Encounter (Signed)
Pt called and aware of new hold. Now will HOLD the Lisinopril, Spironolactone, and Hydralazine.   Will follow up on Thursday here with Dr D

## 2019-06-11 NOTE — Telephone Encounter (Signed)
Have him hold his hydralazine and not take that and continue to monitor blood pressures and let me know, we may have to go down on the dose of hydralazine but for now let us try off of it. Caryl Pina, MD [facility] 06/11/2019, 2:30 PM

## 2019-06-11 NOTE — Telephone Encounter (Signed)
Pt called in stating that BP is running Low Here are meds discussed on last OV with Almyra Free ( pharm)  Resume lasix 42m daily (pt not taking)             Hold lisinopril/spironolactone             Monitor fluid/BP             Patient has not stopped taking amitriptyline             Has not had BM since hospital (2 days)-encourage patient to call if            BM does not come in the next fer days--encouraged      miralax/sennekotS PRN for BM   His BP 5 min ago was 101/47  Now 101/49  Feels: Some nausea Some dizziness Light headed.  Has TOC hosp f/u sch for Thurs with [name].

## 2019-06-13 ENCOUNTER — Telehealth: Payer: Self-pay | Admitting: Family Medicine

## 2019-06-13 NOTE — Telephone Encounter (Signed)
Incoming call from patient stating the health department called him regarding medications.  Informed patient that I have already filed all paperwork for the following medications: Farxiga, Symbicort, Ozempic  Patient has been approved for [place] and Symbicort via Scientist, research (physical sciences) for [employer]  Patient to see PCP tomorrow for hospital f/u  Regina Eck, PharmD, BCPS Clinical Pharmacist, [place]  II Phone [phone]

## 2019-06-14 ENCOUNTER — Other Ambulatory Visit: Payer: Self-pay

## 2019-06-14 ENCOUNTER — Ambulatory Visit (INDEPENDENT_AMBULATORY_CARE_PROVIDER_SITE_OTHER): Payer: PPO | Admitting: Family Medicine

## 2019-06-14 ENCOUNTER — Encounter: Payer: Self-pay | Admitting: Family Medicine

## 2019-06-14 VITALS — BP 85/54 | HR 79 | Temp 97.9°F | Ht 67.0 in | Wt 271.1 lb

## 2019-06-14 DIAGNOSIS — N183 Chronic kidney disease, stage 3 unspecified: Secondary | ICD-10-CM

## 2019-06-14 DIAGNOSIS — I5042 Chronic combined systolic (congestive) and diastolic (congestive) heart failure: Secondary | ICD-10-CM | POA: Diagnosis not present

## 2019-06-14 DIAGNOSIS — E1122 Type 2 diabetes mellitus with diabetic chronic kidney disease: Secondary | ICD-10-CM | POA: Diagnosis not present

## 2019-06-14 DIAGNOSIS — N170 Acute kidney failure with tubular necrosis: Secondary | ICD-10-CM | POA: Diagnosis not present

## 2019-06-14 DIAGNOSIS — I10 Essential (primary) hypertension: Secondary | ICD-10-CM | POA: Diagnosis not present

## 2019-06-14 DIAGNOSIS — I129 Hypertensive chronic kidney disease with stage 1 through stage 4 chronic kidney disease, or unspecified chronic kidney disease: Secondary | ICD-10-CM | POA: Diagnosis not present

## 2019-06-14 DIAGNOSIS — N189 Chronic kidney disease, unspecified: Secondary | ICD-10-CM | POA: Diagnosis not present

## 2019-06-14 DIAGNOSIS — E871 Hypo-osmolality and hyponatremia: Secondary | ICD-10-CM | POA: Diagnosis not present

## 2019-06-14 DIAGNOSIS — D631 Anemia in chronic kidney disease: Secondary | ICD-10-CM | POA: Diagnosis not present

## 2019-06-14 DIAGNOSIS — E211 Secondary hyperparathyroidism, not elsewhere classified: Secondary | ICD-10-CM | POA: Diagnosis not present

## 2019-06-14 DIAGNOSIS — N179 Acute kidney failure, unspecified: Secondary | ICD-10-CM | POA: Diagnosis not present

## 2019-06-14 MED ORDER — ISOSORBIDE DINITRATE 10 MG PO TABS: 10.0000 mg | ORAL_TABLET | Freq: Three times a day (TID) | ORAL | 1 refills | Status: DC

## 2019-06-14 NOTE — Progress Notes (Signed)
BP (!) 85/54   Pulse 79   Temp 97.9 F (36.6 C) (Temporal)   Ht _0  (1.702 m)   Wt 271 lb 2 oz (123 kg)   SpO2 95%   BMI 42.46 kg/m    Subjective:   Patient ID: Kenneth Fuller, male    DOB: 02-11-1945, 74 y.o.   MRN: [id number]  HPI: Kenneth Fuller is a 74 y.o. male presenting on 06/14/2019 for Transitions Of Care   HPI TRANSITIONAL CARE MANAGEMENT TELEPHONE OUTREACH NOTE   Contact Date: 06/06/2019 Contacted By: Zannie Cove, LPN    DISCHARGE INFORMATION Date of Discharge:06/05/19 Discharge Facility: [facility] Discharge Diagnosis:Dizziness and giddiness  Patient was in the hospital for dizziness and lightheadedness and dehydration and constipation.  He was admitted on 06/03/2019 and discharged on 06/05/2019.  He was in the hospital for dehydration and renal disease and lightheadedness and dizziness.  He continues to have the lightheadedness and dizziness today.  His blood pressure is very low and has been running on the lower side and we have been slowly cutting back his blood pressure medications over the past month.  His blood pressure today is 85/54.  It was as high as 110 in his nephrology office earlier today.  Patient denies any chest pain or shortness of breath more than his baseline.  He denies any swelling and says his weight has been stable.  He did see his nephrologist earlier today and they did feel like his kidneys were stable.  Relevant past medical, surgical, family and social history reviewed and updated as indicated. Interim medical history since our last visit reviewed. Allergies and medications reviewed and updated.  Review of Systems  Constitutional: Negative for chills and fever.  Eyes: Negative for visual disturbance.  Respiratory: Negative for shortness of breath and wheezing.   Cardiovascular: Negative for chest pain and leg swelling.  Musculoskeletal: Negative for back pain and gait problem.  Skin: Negative for  rash.  Neurological: Positive for dizziness and light-headedness. Negative for weakness and numbness.  All other systems reviewed and are negative.   Per HPI unless specifically indicated above   Allergies as of 06/14/2019      Reactions   Amlodipine Besy-benazepril Hcl Swelling, Other (See Comments)   Makes tongue swell (lotrel)   Oxycodone Itching   Phenergan [promethazine Hcl] Other (See Comments)   "I can't remember."   Hydrocodone Itching   Can tolerate in low doses      Medication List       Accurate as of Jun 14, 2019  4:37 PM. If you have any questions, ask your nurse or doctor.        STOP taking these medications   hydrALAZINE 25 MG tablet Commonly known as: APRESOLINE Stopped by: [name] Jareth Pardee, MD     TAKE these medications   albuterol 1.25 MG/3ML nebulizer solution Commonly known as: ACCUNEB Take 1 ampule by nebulization every 6 (six) hours as needed for wheezing.   albuterol 108 (90 Base) MCG/ACT inhaler Commonly known as: VENTOLIN HFA Inhale 2 puffs into the lungs every 6 (six) hours as needed for wheezing or shortness of breath.   atorvastatin 40 MG tablet Commonly known as: LIPITOR Take 1 tablet by mouth once daily   bisoprolol 5 MG tablet Commonly known as: ZEBETA Take 0.5 tablets (2.5 mg total) by mouth daily.   Farxiga 5 MG Tabs tablet Generic drug: dapagliflozin propanediol  Take 5 mg by mouth daily.   furosemide 40 MG tablet Commonly known as: LASIX Take 1 tablet (40 mg total) by mouth daily.   isosorbide dinitrate 10 MG tablet Commonly known as: ISORDIL Take 1 tablet (10 mg total) by mouth 3 (three) times daily. What changed:   medication strength  how much to take Changed by: Worthy Rancher, MD   mometasone-formoterol 100-5 MCG/ACT Aero Commonly known as: DULERA Inhale 2 puffs into the lungs 2 (two) times daily.   ondansetron 4 MG tablet Commonly known as: ZOFRAN Take 1 tablet (4 mg total) by mouth every 6 (six)  hours as needed for nausea.   OXYGEN Inhale 2 L into the lungs at bedtime. 2lpm with sleep   Ozempic (0.25 or 0.5 MG/DOSE) 2 MG/1.5ML Sopn Generic drug: Semaglutide(0.25 or 0.5MG/DOS) Inject 0.5 mg into the skin once a week.   polyethylene glycol 17 g packet Commonly known as: MIRALAX / GLYCOLAX Take 17 g by mouth daily.   sertraline 100 MG tablet Commonly known as: ZOLOFT Take 1 tablet (100 mg total) by mouth daily.   tamsulosin 0.4 MG Caps capsule Commonly known as: FLOMAX Take 0.4 mg by mouth daily.   VITAMIN D PO Take 5,000 Units by mouth every morning.        Objective:   BP (!) 85/54   Pulse 79   Temp 97.9 F (36.6 C) (Temporal)   Ht _0  (1.702 m)   Wt 271 lb 2 oz (123 kg)   SpO2 95%   BMI 42.46 kg/m   Wt Readings from Last 3 Encounters:  06/14/19 271 lb 2 oz (123 kg)  06/05/19 281 lb 4.9 oz (127.6 kg)  05/28/19 278 lb 4 oz (126.2 kg)    Physical Exam Vitals and nursing note reviewed.  Constitutional:      General: He is not in acute distress.    Appearance: He is well-developed. He is not diaphoretic.  Eyes:     General: No scleral icterus.       Right eye: No discharge.     Conjunctiva/sclera: Conjunctivae normal.     Pupils: Pupils are equal, round, and reactive to light.  Neck:     Thyroid: No thyromegaly.  Cardiovascular:     Rate and Rhythm: Normal rate and regular rhythm.     Heart sounds: Normal heart sounds. No murmur.  Pulmonary:     Effort: Pulmonary effort is normal. No respiratory distress.     Breath sounds: Normal breath sounds. No wheezing.  Musculoskeletal:        General: Normal range of motion.     Cervical back: Neck supple.  Lymphadenopathy:     Cervical: No cervical adenopathy.  Skin:    General: Skin is warm and dry.     Findings: No rash.  Neurological:     Mental Status: He is alert and oriented to person, place, and time.     Coordination: Coordination normal.  Psychiatric:        Behavior: Behavior normal.        Assessment & Plan:   Problem List Items Addressed This Visit      Cardiovascular and Mediastinum   Essential hypertension - Primary   Relevant Medications   isosorbide dinitrate (ISORDIL) 10 MG tablet   Other Relevant Orders   CBC with Differential/Platelet   CMP14+EGFR   CHF (congestive heart failure) (HCC)   Relevant Medications   isosorbide dinitrate (ISORDIL) 10 MG tablet   Other Relevant  Orders   CBC with Differential/Platelet   CMP14+EGFR     Genitourinary   CKD (chronic kidney disease), stage III   Relevant Orders   CBC with Differential/Platelet   CMP14+EGFR   AKI (acute kidney injury) ([place])   Relevant Orders   CBC with Differential/Platelet   CMP14+EGFR      Recheck kidney and liver function and blood counts, will cut back on his blood pressure medicine again, cut back the isosorbide dinitrate to 10 mg 3 times a day from 30 mg 3 times a day.  Sent a new prescription for him for this. I do not understand fully why they had him hold his Zoloft but we will hold for now and will restart at his next visit. Follow up plan: Return in about 2 months (around 08/14/2019), or if symptoms worsen or fail to improve, for Diabetes and hypertension.  Counseling provided for all of the vaccine components Orders Placed This Encounter  Procedures  . CBC with Differential/Platelet  . [place] Tamme Mozingo, MD [facility] 06/14/2019, 4:37 PM

## 2019-06-15 ENCOUNTER — Other Ambulatory Visit: Payer: Self-pay | Admitting: *Deleted

## 2019-06-15 ENCOUNTER — Other Ambulatory Visit: Payer: PPO

## 2019-06-15 DIAGNOSIS — N183 Chronic kidney disease, stage 3 unspecified: Secondary | ICD-10-CM | POA: Diagnosis not present

## 2019-06-15 DIAGNOSIS — E875 Hyperkalemia: Secondary | ICD-10-CM

## 2019-06-15 LAB — CBC WITH DIFFERENTIAL/PLATELET
Basophils Absolute: 0.1 10*3/uL (ref 0.0–0.2)
Basos: 1 %
EOS (ABSOLUTE): 0.2 10*3/uL (ref 0.0–0.4)
Eos: 1 %
Hematocrit: 37.7 % (ref 37.5–51.0)
Hemoglobin: 12.6 g/dL — ABNORMAL LOW (ref 13.0–17.7)
Immature Grans (Abs): 0 10*3/uL (ref 0.0–0.1)
Immature Granulocytes: 0 %
Lymphocytes Absolute: 2 10*3/uL (ref 0.7–3.1)
Lymphs: 19 %
MCH: 26.5 pg — ABNORMAL LOW (ref 26.6–33.0)
MCHC: 33.4 g/dL (ref 31.5–35.7)
MCV: 79 fL (ref 79–97)
Monocytes Absolute: 0.8 10*3/uL (ref 0.1–0.9)
Monocytes: 8 %
Neutrophils Absolute: 7.4 10*3/uL — ABNORMAL HIGH (ref 1.4–7.0)
Neutrophils: 71 %
Platelets: 236 10*3/uL (ref 150–450)
RBC: 4.76 x10E6/uL (ref 4.14–5.80)
RDW: 17 % — ABNORMAL HIGH (ref 11.6–15.4)
WBC: 10.4 10*3/uL (ref 3.4–10.8)

## 2019-06-15 LAB — BMP8+EGFR
BUN/Creatinine Ratio: 21 (ref 10–24)
BUN: 34 mg/dL — ABNORMAL HIGH (ref 8–27)
CO2: 22 mmol/L (ref 20–29)
Calcium: 10.5 mg/dL — ABNORMAL HIGH (ref 8.6–10.2)
Chloride: 98 mmol/L (ref 96–106)
Creatinine, Ser: 1.64 mg/dL — ABNORMAL HIGH (ref 0.76–1.27)
GFR calc Af Amer: 47 mL/min/{1.73_m2} — ABNORMAL LOW (ref >59)
GFR calc non Af Amer: 41 mL/min/{1.73_m2} — ABNORMAL LOW (ref >59)
Glucose: 131 mg/dL — ABNORMAL HIGH (ref 65–99)
Potassium: 5 mmol/L (ref 3.5–5.2)
Sodium: 135 mmol/L (ref 134–144)

## 2019-06-15 LAB — CMP14+EGFR
ALT: 39 IU/L (ref 0–44)
AST: 26 IU/L (ref 0–40)
Albumin/Globulin Ratio: 1.5 (ref 1.2–2.2)
Albumin: 4.3 g/dL (ref 3.7–4.7)
Alkaline Phosphatase: 97 IU/L (ref 39–117)
BUN/Creatinine Ratio: 20 (ref 10–24)
BUN: 35 mg/dL — ABNORMAL HIGH (ref 8–27)
Bilirubin Total: 0.4 mg/dL (ref 0.0–1.2)
CO2: 23 mmol/L (ref 20–29)
Calcium: 10.8 mg/dL — ABNORMAL HIGH (ref 8.6–10.2)
Chloride: 98 mmol/L (ref 96–106)
Creatinine, Ser: 1.75 mg/dL — ABNORMAL HIGH (ref 0.76–1.27)
GFR calc Af Amer: 44 mL/min/{1.73_m2} — ABNORMAL LOW (ref >59)
GFR calc non Af Amer: 38 mL/min/{1.73_m2} — ABNORMAL LOW (ref >59)
Globulin, Total: 2.9 g/dL (ref 1.5–4.5)
Glucose: 135 mg/dL — ABNORMAL HIGH (ref 65–99)
Potassium: 6 mmol/L (ref 3.5–5.2)
Sodium: 135 mmol/L (ref 134–144)
Total Protein: 7.2 g/dL (ref 6.0–8.5)

## 2019-06-17 DIAGNOSIS — R0600 Dyspnea, unspecified: Secondary | ICD-10-CM | POA: Diagnosis not present

## 2019-06-17 DIAGNOSIS — R0902 Hypoxemia: Secondary | ICD-10-CM | POA: Diagnosis not present

## 2019-06-18 ENCOUNTER — Telehealth: Payer: Self-pay | Admitting: Pharmacist

## 2019-06-18 ENCOUNTER — Ambulatory Visit: Payer: Self-pay | Admitting: Pharmacist

## 2019-06-18 NOTE — Telephone Encounter (Signed)
Notified patient of Ozempic patient assistance medication arrival  60-monthsupply (5 boxes of Ozempic) are in the refrigerator ready for pick up.    FBG 150-160 on Ozempic 0.563msq weekly and Farxiga 42m31mEncouraged patient to be mindful of diet.   Plan to increase Farxiga to10m50mowever cautious given recent admission for dehydration/AKI.  GFR 41 (improving)   Today's BP 122/79  For BP only on isosorbide (reduced dose) and bisoprolol due to recent hypotension/AKI (BP was 85/54 at recent OV)  -holding lisinopril, hydralazine, spironolactone  -lasix 40mg34mly only  -K+ decreased from 6.0 to 5.0  -[facility]  [facility]  Instructed patient to call if needs arise Continue to check BP/BG, call PCP if needed for extreme changes Patient aware of hypoglycemia protocol (denies) Discussed with PCP   JulieRegina EckrmD, BCPS Clinical Pharmacist, [place] [phone]

## 2019-06-25 ENCOUNTER — Ambulatory Visit (INDEPENDENT_AMBULATORY_CARE_PROVIDER_SITE_OTHER): Payer: PPO | Admitting: *Deleted

## 2019-06-25 DIAGNOSIS — E119 Type 2 diabetes mellitus without complications: Secondary | ICD-10-CM | POA: Diagnosis not present

## 2019-06-25 DIAGNOSIS — I10 Essential (primary) hypertension: Secondary | ICD-10-CM

## 2019-06-25 NOTE — Patient Instructions (Signed)
Visit Information  Goals Addressed            This Visit's Progress     Patient Stated   . "I need to understand this EOB that I got" (pt-stated)       CARE PLAN ENTRY (see longitudinal plan of care for additional care plan information)  Current Barriers:  [name] Knowledge Deficits related to EOB from HTA for CCM services  Nurse Case Manager Clinical Goal(s):  [name] Over the next 30 days, patient will talk with RN Care Manager regarding EOB  Interventions:  . Inter-disciplinary care team collaboration (see longitudinal plan of care) . Discussed EOB . Forwarded copy of EOB to CCM management who are looking into the billing component . Advised Mr Seidman that this is a billing error and that he shouldn't receive a bill. A . Advised him to let me know if he does receive a bill.   Patient Self Care Activities:  . Performs ADL's independently . Performs IADL's independently  Initial goal documentation     . "I want to keep my blood sugar under control" (pt-stated)       Current Barriers:  [name] Knowledge Deficits related to acute on chronic elevation of blood pressure . Chronic Disease Management support and education needs related to Hypertension  Nurse Case Manager Clinical Goal(s):  [name] Over the next 60 days, patient will talk with RN CCM regarding blood pressure management . Over the next 60 days, patient will take all medications as prescribed . Over the next 90 days, patient will keep all scheduled medical appointments  Interventions:  . Talked with patient by telephone today . Encouraged patient to check and record blood pressure twice daily . Chart reviewed including recent office notes and lab results . Reviewed and discussed recent in-office BP readings . Discussed home readings o Readings have improved. No lows since last visit.  o BP today 131/89 . Reviewed and discussed medications  . Collaborated with PharmD . Encouraged patient to reach out to PCP with any readings  outside of recommended range  Patient Self Care Activities:  . Performs ADL's independently . Performs IADL's independently  Please see past updates related to this goal by clicking on the "Past Updates" button in the selected goal      . "I would like to keep my blood sugar under control" (pt-stated)        Current Barriers:  [name] Knowledge deficits related to DM management . Financial constraints . CKD  Nurse Case Manager Clinical Goal(s):  [name] Over the next 90 days, patient will continue to work with [facility] clinical pharmacist regarding diabetes management . Over the next 60 days, patient will talk with the RN Care Manager regarding diabetes management  Interventions:  . Chart reviewed, including recent office notes, PharmD notes, and lab reports . Talked with patient by telephone . Reviewed and discussed medications . Discussed home blood sugar readings o Checking blood sugar twice daily o No lows . Encouraged patient to continue checking blood sugar as directed and to report any readings outside of recommended range . Previously provided with RN Care Manager contact information and encouraged to reach out as needed  Patient Self Care Activities:  . Performs ADL's independently . Performs IADL's independently  Please see past updates related to this goal by clicking on the "Past Updates" button in the selected goal         The patient verbalized understanding of instructions provided today and declined a print copy  of patient instruction materials.   Follow-up Plan The care management team will reach out to the patient again over the next 45 days.   [name], BSN, RN-BC Embedded Chronic Care Manager [facility] / [place] Management Direct Dial: [phone]

## 2019-06-25 NOTE — Chronic Care Management (AMB) (Signed)
Chronic Care Management   Follow Up Note   06/25/2019 Name: Kenneth Fuller MRN: [id number] DOB: January 29, 1946  Referred by: [name], Kenneth Kaufmann, MD Reason for referral : Chronic Care Management (RN follow up)   Kenneth Fuller is a 74 y.o. year old male who is a primary care patient of [name], Kenneth Kaufmann, MD. The CCM team was consulted for assistance with chronic disease management and care coordination needs.    Review of patient status, including review of consultants reports, relevant laboratory and other test results, and collaboration with appropriate care team members and the patient's provider was performed as part of comprehensive patient evaluation and provision of chronic care management services.    I spoke with Mr Kenneth Fuller today regarding an EOB he received and management of his chronic medical conditions.   SDOH (Social Determinants of Health) assessments performed: No See Care Plan activities for detailed interventions related to [facility])     Outpatient Encounter Medications as of 06/25/2019  Medication Sig Note  . albuterol (ACCUNEB) 1.25 MG/3ML nebulizer solution Take 1 ampule by nebulization every 6 (six) hours as needed for wheezing.   Kenneth Fuller albuterol (PROVENTIL HFA;VENTOLIN HFA) 108 (90 Base) MCG/ACT inhaler Inhale 2 puffs into the lungs every 6 (six) hours as needed for wheezing or shortness of breath.   Kenneth Fuller atorvastatin (LIPITOR) 40 MG tablet Take 1 tablet by mouth once daily   . bisoprolol (ZEBETA) 5 MG tablet Take 0.5 tablets (2.5 mg total) by mouth daily.   . Cholecalciferol (VITAMIN D PO) Take 5,000 Units by mouth every morning.    . dapagliflozin propanediol (FARXIGA) 5 MG TABS tablet Take 5 mg by mouth daily.   . furosemide (LASIX) 40 MG tablet Take 1 tablet (40 mg total) by mouth daily.   . isosorbide dinitrate (ISORDIL) 10 MG tablet Take 1 tablet (10 mg total) by mouth 3 (three) times daily.   . mometasone-formoterol (DULERA) 100-5 MCG/ACT AERO Inhale 2 puffs  into the lungs 2 (two) times daily. 05/17/2019: [employer] Patient assistance  . ondansetron (ZOFRAN) 4 MG tablet Take 1 tablet (4 mg total) by mouth every 6 (six) hours as needed for nausea.   . OXYGEN Inhale 2 L into the lungs at bedtime. 2lpm with sleep    . polyethylene glycol (MIRALAX / GLYCOLAX) 17 g packet Take 17 g by mouth daily.   . Semaglutide,0.25 or 0.5MG/DOS, (OZEMPIC, 0.25 OR 0.5 MG/DOSE,) 2 MG/1.5ML SOPN Inject 0.5 mg into the skin once a week.   . sertraline (ZOLOFT) 100 MG tablet Take 1 tablet (100 mg total) by mouth daily. (Patient not taking: Reported on 06/14/2019)   . tamsulosin (FLOMAX) 0.4 MG CAPS capsule Take 0.4 mg by mouth daily.    No facility-administered encounter medications on file as of 06/25/2019.     RN Care Plan   . "I need to understand this EOB that I got" (pt-stated)       CARE PLAN ENTRY (see longitudinal plan of care for additional care plan information)  Current Barriers:  Kenneth Fuller Knowledge Deficits related to EOB from HTA for CCM services  Nurse Case Manager Clinical Goal(s):  Kenneth Fuller Over the next 30 days, patient will talk with RN Care Manager regarding EOB  Interventions:  . Inter-disciplinary care team collaboration (see longitudinal plan of care) . Discussed EOB . Forwarded copy of EOB to CCM management who are looking into the billing component . Advised Mr Kenneth Fuller that this is a billing error and that he shouldn't receive a  bill. A . Advised him to let me know if he does receive a bill.   Patient Self Care Activities:  . Performs ADL's independently . Performs IADL's independently  Initial goal documentation     . "I want to keep my blood sugar under control" (pt-stated)       Current Barriers:  Kenneth Fuller Knowledge Deficits related to acute on chronic elevation of blood pressure . Chronic Disease Management support and education needs related to Hypertension  Nurse Case Manager Clinical Goal(s):  Kenneth Fuller Over the next 60 days, patient will talk with RN CCM  regarding blood pressure management . Over the next 60 days, patient will take all medications as prescribed . Over the next 90 days, patient will keep all scheduled medical appointments  Interventions:  . Talked with patient by telephone today . Encouraged patient to check and record blood pressure twice daily . Chart reviewed including recent office notes and lab results . Reviewed and discussed recent in-office BP readings . Discussed home readings o Readings have improved. No lows since last visit.  o BP today 131/89 . Reviewed and discussed medications  . Collaborated with PharmD . Encouraged patient to reach out to PCP with any readings outside of recommended range  Patient Self Care Activities:  . Performs ADL's independently . Performs IADL's independently  Please see past updates related to this goal by clicking on the "Past Updates" button in the selected goal      . "I would like to keep my blood sugar under control" (pt-stated)        Current Barriers:  Kenneth Fuller Knowledge deficits related to DM management . Financial constraints . CKD  Nurse Case Manager Clinical Goal(s):  Kenneth Fuller Over the next 90 days, patient will continue to work with [facility] clinical pharmacist regarding diabetes management . Over the next 60 days, patient will talk with the RN Care Manager regarding diabetes management  Interventions:  . Chart reviewed, including recent office notes, PharmD notes, and lab reports . Talked with patient by telephone . Reviewed and discussed medications . Discussed home blood sugar readings o Checking blood sugar twice daily o No lows . Encouraged patient to continue checking blood sugar as directed and to report any readings outside of recommended range . Previously provided with RN Care Manager contact information and encouraged to reach out as needed  Patient Self Care Activities:  . Performs ADL's independently . Performs IADL's independently  Please see past updates  related to this goal by clicking on the "Past Updates" button in the selected goal          Plan:   The care management team will reach out to the patient again over the next 45 days.    [name], BSN, RN-BC Embedded Chronic Care Manager [facility] / [place] Management Direct Dial: [phone]

## 2019-07-12 ENCOUNTER — Ambulatory Visit (INDEPENDENT_AMBULATORY_CARE_PROVIDER_SITE_OTHER): Payer: PPO | Admitting: Licensed Clinical Social Worker

## 2019-07-12 DIAGNOSIS — J449 Chronic obstructive pulmonary disease, unspecified: Secondary | ICD-10-CM

## 2019-07-12 DIAGNOSIS — I5042 Chronic combined systolic (congestive) and diastolic (congestive) heart failure: Secondary | ICD-10-CM | POA: Diagnosis not present

## 2019-07-12 DIAGNOSIS — I10 Essential (primary) hypertension: Secondary | ICD-10-CM | POA: Diagnosis not present

## 2019-07-12 DIAGNOSIS — N183 Chronic kidney disease, stage 3 unspecified: Secondary | ICD-10-CM | POA: Diagnosis not present

## 2019-07-12 DIAGNOSIS — E119 Type 2 diabetes mellitus without complications: Secondary | ICD-10-CM | POA: Diagnosis not present

## 2019-07-12 DIAGNOSIS — I2510 Atherosclerotic heart disease of native coronary artery without angina pectoris: Secondary | ICD-10-CM | POA: Diagnosis not present

## 2019-07-12 DIAGNOSIS — F339 Major depressive disorder, recurrent, unspecified: Secondary | ICD-10-CM

## 2019-07-12 NOTE — Chronic Care Management (AMB) (Signed)
Chronic Care Management    Clinical Social Work Follow Up Note  07/12/2019 Name: Kenneth Fuller MRN: [id number] DOB: Jan 18, 1946  Kenneth Fuller is a 74 y.o. year old male who is a primary care patient of [name], Kenneth Kaufmann, MD. The CCM team was consulted for assistance with [name] .   Review of patient status, including review of consultants reports, other relevant assessments, and collaboration with appropriate care team members and the patient's provider was performed as part of comprehensive patient evaluation and provision of chronic care management services.    SDOH (Social Determinants of Health) assessments performed: Yes;risk for tobacco use; risk for depression; risk for physical inactivity    Office Visit from 05/28/2019 in [place]  PHQ-9 Total Score  10     GAD 7 : Generalized Anxiety Score 05/28/2019 11/11/2017  Nervous, Anxious, on Edge 0 3  Control/stop worrying 2 3  Worry too much - different things 2 3  Trouble relaxing 2 3  Restless 0 3  Easily annoyed or irritable 2 3  Afraid - awful might happen 0 0  Total GAD 7 Score 8 18  Anxiety Difficulty Somewhat difficult Somewhat difficult    Outpatient Encounter Medications as of 07/12/2019  Medication Sig Note  . albuterol (ACCUNEB) 1.25 MG/3ML nebulizer solution Take 1 ampule by nebulization every 6 (six) hours as needed for wheezing.   [name] albuterol (PROVENTIL HFA;VENTOLIN HFA) 108 (90 Base) MCG/ACT inhaler Inhale 2 puffs into the lungs every 6 (six) hours as needed for wheezing or shortness of breath.   [name] atorvastatin (LIPITOR) 40 MG tablet Take 1 tablet by mouth once daily   . bisoprolol (ZEBETA) 5 MG tablet Take 0.5 tablets (2.5 mg total) by mouth daily.   . Cholecalciferol (VITAMIN D PO) Take 5,000 Units by mouth every morning.    . dapagliflozin propanediol (FARXIGA) 5 MG TABS tablet Take 5 mg by mouth daily.   . furosemide (LASIX) 40 MG tablet Take 1 tablet (40 mg total)  by mouth daily.   . isosorbide dinitrate (ISORDIL) 10 MG tablet Take 1 tablet (10 mg total) by mouth 3 (three) times daily.   . mometasone-formoterol (DULERA) 100-5 MCG/ACT AERO Inhale 2 puffs into the lungs 2 (two) times daily. 05/17/2019: [employer] Patient assistance  . ondansetron (ZOFRAN) 4 MG tablet Take 1 tablet (4 mg total) by mouth every 6 (six) hours as needed for nausea.   . OXYGEN Inhale 2 L into the lungs at bedtime. 2lpm with sleep    . polyethylene glycol (MIRALAX / GLYCOLAX) 17 g packet Take 17 g by mouth daily.   . Semaglutide,0.25 or 0.5MG/DOS, (OZEMPIC, 0.25 OR 0.5 MG/DOSE,) 2 MG/1.5ML SOPN Inject 0.5 mg into the skin once a week.   . sertraline (ZOLOFT) 100 MG tablet Take 1 tablet (100 mg total) by mouth daily. (Patient not taking: Reported on 06/14/2019)   . tamsulosin (FLOMAX) 0.4 MG CAPS capsule Take 0.4 mg by mouth daily.    No facility-administered encounter medications on file as of 07/12/2019.    GOAL:   CARE PLAN ENTRY     . Client has stress related to ongoing health issues and wishes to talk more about stress over current health issues (pt-stated)     Current Barriers:  [name] Mental Health Concerns  in patient with Chronic Diagnoses of CAD, DM Type II, Depression,recurrent, CKD, COPD and CHF .  Challenges related to managing health issues of client   Clinical Social Work Clinical Goal(s):  .  Over the next 30 days, client will work with LCSW to address concerns related to health issues of client and client management of health issues faced  Interventions:  Talked with client about pain issues of client   [place] client about breathing issues of client  Talked with client about his current needs  Talked with client about sleeping challenges of client  Talked with client about his decreased energy, fatigue  Talked with client about his upcoming medical appointments  Talked with client about ambulation challenges of client  Talked with client about upcoming  client appointment with kidney specialist (August 15, 2019)  Talked with client about social support network  Talked with client about his support with [facility] pharmacist [name]  Talked with client about vision challenges of client  Talked with client about blood pressure reading of patient  Patient Self Care Activities:  . Self administers medications as prescribed . Attends all scheduled provider appointments . Performs ADL's independently   Plan:  LCSW to call client in next 4 weeks to talk with client about stress issues related to his management of health issues faced Client to communicate with RNCM to discuss nursing needs of client Client to attend scheduled client medical appointments   Initial goal documentation    Follow Up Plan: LCSW to call client in next 4 weeks to talk with him about stress issues related to management of health issues faced  [name].Candyce Gambino MSW, LCSW Licensed Clinical Social Worker [facility] Care Management [phone]

## 2019-07-12 NOTE — Patient Instructions (Addendum)
Licensed Clinical Social Worker Visit Information  Goals we discussed today:     [place]    . Client has stress related to ongoing health issues and wishes to talk more about stress over current health issues (pt-stated)      Current Barriers:  Mental Health Concerns in patient with Chronic Diagnoses of CAD, DM Type II, Depression,recurrent, CKD, COPD and CHF  Challenges related to managing health issues of client   Clinical Social Work Clinical Goal(s):  Over the next 30 days, client will work with LCSW to address concerns related to health issues of client and client management of health issues faced  Interventions:  Talked with client about pain issues of client  Talked with client about breathing issues of client  Talked with client about his current needs  Talked with client about sleeping challenges of client  Talked with client about his decreased energy, fatigue  Talked with client about his upcoming medical appointments  Talked with client about ambulation challenges of client  Talked with client about upcoming client appointment with kidney specialist (August 15, 2019)  Talked with client about social support network  Talked with client about his support with [facility] pharmacist [name]  Talked with client about vision challenges of client  Talked with client about blood pressure readings of patient  Patient Self Care Activities:  Self administers medications as prescribed  Attends all scheduled provider appointments  Performs ADL's independently  Plan:  LCSW to call client in next 4 weeks to talk with client about stress issues related to his management of health issues faced  Client to communicate with RNCM to discuss nursing needs of client  Client to attend scheduled client medical appointments  Initial goal documentation    Follow Up Plan: LCSW to call client in next 4 weeks to talk with him about stress issues related to management of health issues faced    Materials Provided: No  The patient verbalized understanding of instructions provided today and declined a print copy of patient instruction materials.   [name].Sherril Shipman MSW, LCSW Licensed Clinical Social Worker [facility] [phone]

## 2019-07-27 ENCOUNTER — Ambulatory Visit (INDEPENDENT_AMBULATORY_CARE_PROVIDER_SITE_OTHER): Payer: PPO | Admitting: *Deleted

## 2019-07-27 ENCOUNTER — Other Ambulatory Visit: Payer: Self-pay | Admitting: Cardiovascular Disease

## 2019-07-27 DIAGNOSIS — Z0000 Encounter for general adult medical examination without abnormal findings: Secondary | ICD-10-CM

## 2019-07-27 NOTE — Progress Notes (Signed)
MEDICARE ANNUAL WELLNESS VISIT  07/27/2019  Telephone Visit Disclaimer This Medicare AWV was conducted by telephone due to national recommendations for restrictions regarding the COVID-19 Pandemic (e.g. social distancing).  I verified, using two identifiers, that I am speaking with Kenneth Fuller or their authorized healthcare agent. I discussed the limitations, risks, security, and privacy concerns of performing an evaluation and management service by telephone and the potential availability of an in-person appointment in the future. The patient expressed understanding and agreed to proceed.   Subjective:  Kenneth Fuller is a 74 y.o. male patient of [name], [name], MD who had a Medicare Annual Wellness Visit today via telephone. Shamal is Retired and lives with their spouse. he has 3 children. he reports that he is socially active and does interact with friends/family regularly. he is minimally physically active and enjoys staying active in [place] and reading.  Patient Care Team: [name], [name], MD as PCP - General (Family Medicine) Croitoru, Dani Gobble, MD as PCP - Cardiology (Cardiology) Melina Schools, MD as Consulting Physician (Orthopedic Surgery) Harlen Labs, MD as Referring Physician (Optometry) Shea Evans, [name], LCSW as Social Worker (Licensed Clinical Social Worker) Cori Razor, Delice Bison, RN as Case Manager Croitoru, Dani Gobble, MD as Consulting Physician (Cardiology) Fran Lowes, MD (Inactive) as Consulting Physician (Nephrology) [name], [facility] (Pharmacist)  Advanced Directives 07/27/2019 06/04/2019 06/03/2019 12/04/2018 09/23/2018 09/22/2018 07/17/2018  Does Patient Have a Medical Advance Directive? _0  No No  Would patient like information on creating a medical advance directive? No - Patient declined No - Patient declined No - Patient declined - No - Patient declined No - Patient declined No - Patient declined    Hospital Utilization Over the  Past 12 Months: # of hospitalizations or ER visits: 3 # of surgeries: 0  Review of Systems    Patient reports that his overall health is better compared to last year.  History obtained from chart review  Patient Reported Readings (BP, Pulse, CBG, Weight, etc) BP 124/80 Blood Sugar-127  Pain Assessment Pain : No/denies pain     Current Medications & Allergies (verified) Allergies as of 07/27/2019      Reactions   Amlodipine Besy-benazepril Hcl Swelling, Other (See Comments)   Makes tongue swell (lotrel)   Oxycodone Itching   Phenergan [promethazine Hcl] Other (See Comments)   "I can't remember."   Hydrocodone Itching   Can tolerate in low doses      Medication List       Accurate as of July 27, 2019  1:59 PM. If you have any questions, ask your nurse or doctor.        albuterol 1.25 MG/3ML nebulizer solution Commonly known as: ACCUNEB Take 1 ampule by nebulization every 6 (six) hours as needed for wheezing.   albuterol 108 (90 Base) MCG/ACT inhaler Commonly known as: VENTOLIN HFA Inhale 2 puffs into the lungs every 6 (six) hours as needed for wheezing or shortness of breath.   atorvastatin 40 MG tablet Commonly known as: LIPITOR Take 1 tablet by mouth once daily   bisoprolol 5 MG tablet Commonly known as: ZEBETA Take 0.5 tablets (2.5 mg total) by mouth daily.   Farxiga 5 MG Tabs tablet Generic drug: dapagliflozin propanediol Take 5 mg by mouth daily.   furosemide 40 MG tablet Commonly known as: LASIX Take 1 tablet (40 mg total) by mouth daily.   isosorbide dinitrate 10 MG tablet Commonly known as: ISORDIL Take 1 tablet (  10 mg total) by mouth 3 (three) times daily.   mometasone-formoterol 100-5 MCG/ACT Aero Commonly known as: DULERA Inhale 2 puffs into the lungs 2 (two) times daily.   ondansetron 4 MG tablet Commonly known as: ZOFRAN Take 1 tablet (4 mg total) by mouth every 6 (six) hours as needed for nausea.   OXYGEN Inhale 2 L into the lungs  at bedtime. 2lpm with sleep   Ozempic (0.25 or 0.5 MG/DOSE) 2 MG/1.5ML Sopn Generic drug: Semaglutide(0.25 or 0.5MG/DOS) Inject 0.5 mg into the skin once a week.   polyethylene glycol 17 g packet Commonly known as: MIRALAX / GLYCOLAX Take 17 g by mouth daily.   sertraline 100 MG tablet Commonly known as: ZOLOFT Take 1 tablet (100 mg total) by mouth daily.   tamsulosin 0.4 MG Caps capsule Commonly known as: FLOMAX Take 0.4 mg by mouth daily.   VITAMIN D PO Take 5,000 Units by mouth every morning.       History (reviewed): Past Medical History:  Diagnosis Date  . Allergy   . Asthma   . CAD (coronary artery disease)   . CHF (congestive heart failure) ([place])   . Diabetes mellitus   . Fibromyalgia   . GERD (gastroesophageal reflux disease)   . GI bleeding   . Gout   . Hyperlipidemia   . Hypertension   . Hypogonadism male   . Insomnia   . MRSA cellulitis   . Neuropathy   . Obesity   . Shortness of breath dyspnea    with exertion   . Sleep apnea    cpap- 14   . Wheezing    no asthma diagnosis   Past Surgical History:  Procedure Laterality Date  . BACK SURGERY    . CARDIAC CATHETERIZATION    . COLONOSCOPY    . LEFT HEART CATH AND CORONARY ANGIOGRAPHY N/A 12/02/2016   Procedure: LEFT HEART CATH AND CORONARY ANGIOGRAPHY;  Surgeon: Jettie Booze, MD;  Location: [facility];  Service: Cardiovascular;  Laterality: N/A;  . LUMBAR LAMINECTOMY/DECOMPRESSION MICRODISCECTOMY N/A 01/02/2014   Procedure: CENTRAL DECOMPRESSION LUMBAR LAMINECTOMY L3-L4, L4-L5;  Surgeon: Tobi Bastos, MD;  Location: [facility];  Service: Orthopedics;  Laterality: N/A;  . neck fusion     Family History  Problem Relation Age of Onset  . Colon cancer Mother   . Diabetes Father        siblings  . Heart disease Father        brother  . Heart attack Father   . Kidney disease Sister   . Heart failure Sister   . Heart disease Brother   . Heart attack Son 58  . Drug abuse Sister    . Heart disease Brother   . Deep vein thrombosis Brother   . Colon polyps Neg Hx    Social History   Socioeconomic History  . Marital status: Married    Spouse name: Butch Penny  . Number of children: 3  . Years of education: 8  . Highest education level: GED or equivalent  Occupational History  . 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 Smoking status: Former Smoker    Quit date: 02/08/1969    Years since quitting: 50.4  . Smokeless tobacco: Former Systems developer    Quit date: Pharmacologist  . Vaping Use: Never used  Substance and Sexual Activity  . Alcohol use: No    Alcohol/week: 0.0 standard drinks  .  Drug use: No  . Sexual activity: Not Currently  Other Topics Concern  . Not on file  Social History Narrative   Drinks caffeine tea occasionally    Social Determinants of Health   Financial Resource Strain: Low Risk   . Difficulty of Paying Living Expenses: Not hard at all  Food Insecurity: No Food Insecurity  . Worried About Charity fundraiser in the Last Year: Never true  . Ran Out of Food in the Last Year: Never true  Transportation Needs: No Transportation Needs  . Lack of Transportation (Medical): No  . Lack of Transportation (Non-Medical): No  Physical Activity: Inactive  . Days of Exercise per Week: 0 days  . Minutes of Exercise per Session: 0 min  Stress: No Stress Concern Present  . Feeling of Stress : Not at all  Social Connections: Socially Integrated  . Frequency of Communication with Friends and Family: More than three times a week  . Frequency of Social Gatherings with Friends and Family: More than three times a week  . Attends Religious Services: More than 4 times per year  . Active Member of Clubs or Organizations: Yes  . Attends Archivist Meetings: More than 4 times per year  . Marital Status: Married    Activities of Daily Living In your present state of health, do you have any  difficulty performing the following activities: 07/27/2019 06/04/2019  Hearing? N N  Vision? N N  Comment wears rx glasses-had diabetic eye exam 3 months ago at [facility] in [place]-records requested -  Difficulty concentrating or making decisions? N N  Walking or climbing stairs? N N  Dressing or bathing? N N  Doing errands, shopping? N N  Preparing Food and eating ? N -  Using the Toilet? N -  In the past six months, have you accidently leaked urine? Y -  Comment ocassional dribble if he doesn't make it to the bathroom on time -  Do you have problems with loss of bowel control? N -  Managing your Medications? N -  Managing your Finances? N -  Housekeeping or managing your Housekeeping? N -  Some recent data might be hidden    Patient Education/ Literacy How often do you need to have someone help you when you read instructions, pamphlets, or other written materials from your doctor or pharmacy?: 1 - Never What is the last grade level you completed in school?: GED  Exercise Current Exercise Habits: The patient does not participate in regular exercise at present, Exercise limited by: respiratory conditions(s);cardiac condition(s)  Diet Patient reports consuming 2 meals a day and 1 snack(s) a day Patient reports that his primary diet is: Regular Patient reports that she does have regular access to food.   Depression Screen PHQ 2/9 Scores 07/27/2019 06/14/2019 05/28/2019 05/15/2019 02/15/2019 11/15/2018 10/05/2018  PHQ - 2 Score 0 0 4 0 1 0 1  PHQ- 9 Score - - 10 - - - -     Fall Risk Fall Risk  07/27/2019 06/14/2019 05/28/2019 05/15/2019 02/15/2019  Falls in the past year? 0 1 1 0 1  Number falls in past yr: - 1 1 - 0  Injury with Fall? - 0 0 - 0  Comment - - - - -  Risk for fall due to : - History of fall(s) History of fall(s) - -  Follow up - Falls evaluation completed Falls evaluation completed - -     Objective:  Kenneth Salaam Roach  seemed alert and oriented and he participated  appropriately during our telephone visit.  Blood Pressure Weight BMI  BP Readings from Last 3 Encounters:  06/14/19 (!) 85/54  06/08/19 140/80  06/05/19 123/77   Wt Readings from Last 3 Encounters:  06/14/19 271 lb 2 oz (123 kg)  06/05/19 281 lb 4.9 oz (127.6 kg)  05/28/19 278 lb 4 oz (126.2 kg)   BMI Readings from Last 1 Encounters:  06/14/19 42.46 kg/m    *Unable to obtain current vital signs, weight, and BMI due to telephone visit type  Hearing/Vision  . Jerone did not seem to have difficulty with hearing/understanding during the telephone conversation . Reports that he has had a formal eye exam by an eye care professional within the past year . Reports that he has not had a formal hearing evaluation within the past year *Unable to fully assess hearing and vision during telephone visit type  Cognitive Function: 6CIT Screen 07/27/2019 07/17/2018  What Year? 0 points 0 points  What month? 0 points 0 points  What time? 0 points 0 points  Count back from 20 0 points 0 points  Months in reverse 0 points 2 points  Repeat phrase 2 points 0 points  Total Score 2 2   (Normal:0-7, Significant for Dysfunction: >8)  Normal Cognitive Function Screening: Yes   Immunization & Health Maintenance Record Immunization History  Administered Date(s) Administered  . Fluad Quad(high Dose 65+) 11/15/2018  . Influenza Whole 10/09/2008  . Influenza, High Dose Seasonal PF 11/29/2016, 11/08/2017  . Influenza,inj,Quad PF,6+ Mos 12/04/2013, 12/04/2014, 12/23/2015  . Pneumococcal Conjugate-13 12/04/2013  . Pneumococcal Polysaccharide-23 08/26/2011  . Td 11/08/2017    Health Maintenance  Topic Date Due  . COVID-19 Vaccine (1) Never done  . OPHTHALMOLOGY EXAM  03/24/2019  . INFLUENZA VACCINE  09/09/2019  . URINE MICROALBUMIN  10/02/2019  . FOOT EXAM  11/15/2019  . HEMOGLOBIN A1C  11/27/2019  . COLONOSCOPY  07/30/2020  . TETANUS/TDAP  11/09/2027  . Hepatitis C Screening  Completed  . PNA  vac Low Risk Adult  Addressed       Assessment  This is a routine wellness examination for Wilmar Prabhakar Heaslip.  Health Maintenance: Due or Overdue Health Maintenance Due  Topic Date Due  . COVID-19 Vaccine (1) Never done  . OPHTHALMOLOGY EXAM  03/24/2019    Kenneth Salaam Richens does not need a referral for Community Assistance: Care Management:   Enrolled with [name], RN Social Work:    Enrolled with Theadore Nan, CSW Prescription Assistance:  [employer]  Nutrition/Diabetes Education:  no   Plan:  Personalized Goals Goals Addressed              This Visit's Progress   .  Weight (lb) < 200 lb (90.7 kg) (pt-stated)        Pt states "I'm trying to lose weight"      Personalized Health Maintenance & Screening Recommendations  Shingrix and COVID vaccines  Lung Cancer Screening Recommended: no (Low Dose CT Chest recommended if Age 83-80 years, 30 pack-year currently smoking OR have quit w/in past 15 years) Hepatitis C Screening recommended: no HIV Screening recommended: no  Advanced Directives: Written information was not prepared per patient's request.  Referrals & Orders No orders of the defined types were placed in this encounter.   Follow-up Plan . Follow-up with [name], [name], MD as planned . Consider Shingrix vaccine at your next visit with your PCP . Consider COVID vaccine at your  local pharmacy   I have personally reviewed and noted the following in the patient's chart:   . Medical and social history . Use of alcohol, tobacco or illicit drugs  . Current medications and supplements . Functional ability and status . Nutritional status . Physical activity . Advanced directives . List of other physicians . Hospitalizations, surgeries, and ER visits in previous 12 months . Vitals . Screenings to include cognitive, depression, and falls . Referrals and appointments  In addition, I have reviewed and discussed with Kenneth Salaam Carnathan certain  preventive protocols, quality metrics, and best practice recommendations. A written personalized care plan for preventive services as well as general preventive health recommendations is available and can be mailed to the patient at his request.      Milas Hock, LPN  07/27/1548

## 2019-08-02 ENCOUNTER — Telehealth: Payer: PPO

## 2019-08-04 ENCOUNTER — Other Ambulatory Visit: Payer: Self-pay | Admitting: Cardiovascular Disease

## 2019-08-10 ENCOUNTER — Other Ambulatory Visit: Payer: Self-pay

## 2019-08-10 ENCOUNTER — Other Ambulatory Visit: Payer: PPO

## 2019-08-10 ENCOUNTER — Other Ambulatory Visit: Payer: Self-pay | Admitting: Family Medicine

## 2019-08-10 DIAGNOSIS — N170 Acute kidney failure with tubular necrosis: Secondary | ICD-10-CM | POA: Diagnosis not present

## 2019-08-10 DIAGNOSIS — E1122 Type 2 diabetes mellitus with diabetic chronic kidney disease: Secondary | ICD-10-CM | POA: Diagnosis not present

## 2019-08-10 DIAGNOSIS — I5042 Chronic combined systolic (congestive) and diastolic (congestive) heart failure: Secondary | ICD-10-CM | POA: Diagnosis not present

## 2019-08-10 DIAGNOSIS — E211 Secondary hyperparathyroidism, not elsewhere classified: Secondary | ICD-10-CM | POA: Diagnosis not present

## 2019-08-10 DIAGNOSIS — N189 Chronic kidney disease, unspecified: Secondary | ICD-10-CM | POA: Diagnosis not present

## 2019-08-10 DIAGNOSIS — B379 Candidiasis, unspecified: Secondary | ICD-10-CM | POA: Diagnosis not present

## 2019-08-15 DIAGNOSIS — I129 Hypertensive chronic kidney disease with stage 1 through stage 4 chronic kidney disease, or unspecified chronic kidney disease: Secondary | ICD-10-CM | POA: Diagnosis not present

## 2019-08-15 DIAGNOSIS — E1122 Type 2 diabetes mellitus with diabetic chronic kidney disease: Secondary | ICD-10-CM | POA: Diagnosis not present

## 2019-08-15 DIAGNOSIS — N170 Acute kidney failure with tubular necrosis: Secondary | ICD-10-CM | POA: Diagnosis not present

## 2019-08-15 DIAGNOSIS — N189 Chronic kidney disease, unspecified: Secondary | ICD-10-CM | POA: Diagnosis not present

## 2019-08-15 DIAGNOSIS — E611 Iron deficiency: Secondary | ICD-10-CM | POA: Diagnosis not present

## 2019-08-15 DIAGNOSIS — E6609 Other obesity due to excess calories: Secondary | ICD-10-CM | POA: Diagnosis not present

## 2019-08-16 ENCOUNTER — Ambulatory Visit (INDEPENDENT_AMBULATORY_CARE_PROVIDER_SITE_OTHER): Payer: PPO | Admitting: Licensed Clinical Social Worker

## 2019-08-16 DIAGNOSIS — J441 Chronic obstructive pulmonary disease with (acute) exacerbation: Secondary | ICD-10-CM | POA: Diagnosis not present

## 2019-08-16 DIAGNOSIS — I2510 Atherosclerotic heart disease of native coronary artery without angina pectoris: Secondary | ICD-10-CM

## 2019-08-16 DIAGNOSIS — F339 Major depressive disorder, recurrent, unspecified: Secondary | ICD-10-CM

## 2019-08-16 DIAGNOSIS — E119 Type 2 diabetes mellitus without complications: Secondary | ICD-10-CM

## 2019-08-16 DIAGNOSIS — I5042 Chronic combined systolic (congestive) and diastolic (congestive) heart failure: Secondary | ICD-10-CM | POA: Diagnosis not present

## 2019-08-16 DIAGNOSIS — N183 Chronic kidney disease, stage 3 unspecified: Secondary | ICD-10-CM | POA: Diagnosis not present

## 2019-08-16 NOTE — Chronic Care Management (AMB) (Signed)
Chronic Care Management    Clinical Social Work Follow Up Note  08/16/2019 Name: Kenneth Fuller MRN: [id number] DOB: 01-25-1946  Kenneth Fuller is a 74 y.o. year old male who is a primary care patient of Kenneth Fuller, Kenneth Kaufmann, MD. The CCM team was consulted for assistance with [name] .   Review of patient status, including review of consultants reports, other relevant assessments, and collaboration with appropriate care team members and the patient's provider was performed as part of comprehensive patient evaluation and provision of chronic care management services.    SDOH (Social Determinants of Health) assessments performed: No;risk for tobacco use; risk for physical inactivity; risk for stress    Office Visit from 05/28/2019 in [place]  PHQ-9 Total Score 10     GAD 7 : Generalized Anxiety Score 05/28/2019 11/11/2017  Nervous, Anxious, on Edge 0 3  Control/stop worrying 2 3  Worry too much - different things 2 3  Trouble relaxing 2 3  Restless 0 3  Easily annoyed or irritable 2 3  Afraid - awful might happen 0 0  Total GAD 7 Score 8 18  Anxiety Difficulty Somewhat difficult Somewhat difficult    Outpatient Encounter Medications as of 08/16/2019  Medication Sig Note  . albuterol (ACCUNEB) 1.25 MG/3ML nebulizer solution Take 1 ampule by nebulization every 6 (six) hours as needed for wheezing.   [name] albuterol (PROVENTIL HFA;VENTOLIN HFA) 108 (90 Base) MCG/ACT inhaler Inhale 2 puffs into the lungs every 6 (six) hours as needed for wheezing or shortness of breath.   [name] atorvastatin (LIPITOR) 40 MG tablet Take 1 tablet by mouth once daily   . bisoprolol (ZEBETA) 5 MG tablet Take 0.5 tablets (2.5 mg total) by mouth daily.   . Cholecalciferol (VITAMIN D PO) Take 5,000 Units by mouth every morning.    . dapagliflozin propanediol (FARXIGA) 5 MG TABS tablet Take 5 mg by mouth daily.   . furosemide (LASIX) 40 MG tablet Take 1 tablet (40 mg total) by  mouth daily.   . isosorbide dinitrate (ISORDIL) 10 MG tablet TAKE 1 TABLET BY MOUTH THREE TIMES DAILY   . mometasone-formoterol (DULERA) 100-5 MCG/ACT AERO Inhale 2 puffs into the lungs 2 (two) times daily. 05/17/2019: [employer] Patient assistance  . ondansetron (ZOFRAN) 4 MG tablet Take 1 tablet (4 mg total) by mouth every 6 (six) hours as needed for nausea.   . OXYGEN Inhale 2 L into the lungs at bedtime. 2lpm with sleep    . polyethylene glycol (MIRALAX / GLYCOLAX) 17 g packet Take 17 g by mouth daily.   . Semaglutide,0.25 or 0.5MG/DOS, (OZEMPIC, 0.25 OR 0.5 MG/DOSE,) 2 MG/1.5ML SOPN Inject 0.5 mg into the skin once a week.   . sertraline (ZOLOFT) 100 MG tablet Take 1 tablet (100 mg total) by mouth daily.   . tamsulosin (FLOMAX) 0.4 MG CAPS capsule Take 0.4 mg by mouth daily.    No facility-administered encounter medications on file as of 08/16/2019.    Goals    .  Client has stress related to ongoing health issues and wishes to talk more about stress over current health issues (pt-stated)      Current Barriers:  [name] Mental Health Concerns  in patient with Chronic Diagnoses of CAD, DM Type II, Depression,recurrent, CKD, COPD and CHF .  Challenges related to managing health issues of client   Clinical Social Work Clinical Goal(s):  [name] Over the next 30 days, client will work with LCSW to address concerns  related to health issues of client and client management of health issues faced  Interventions:  . Encouraged client to communicate with RNCM to discuss nursing needs of client . Talked with client about his management of health issues faced by client . Talked with client about sleeping issues of client . Talked with client about pain issues of client . Talked with client about portable oxygen system of client . Talked with Kenneth Fuller about his recent appointment with nephrologist  Talked with client about his support with [facility] pharmacist Kenneth Fuller  Talked with client about vision challenges of  client  Talked with client about blood pressure reading of patient  Talked with Kenneth Fuller about energy, fatigue of client  Talked with Kenneth Fuller about ambulation issues of client   Patient Self Care Activities:  . Self administers medications as prescribed . Attends all scheduled provider appointments . Performs ADL's independently   Plan:  LCSW to call client in next 4 weeks to talk with client about stress issues related to his management of health issues faced Client to communicate with RNCM to discuss nursing needs of client Client to attend scheduled client medical appointments   Initial goal documentation    Follow Up Plan: LCSW to call client in next 4 weeks to talk with him about stress issues related to management of health issues faced  Kenneth Fuller.Kenneth Fuller MSW, LCSW Licensed Clinical Social Worker [facility] [phone]

## 2019-08-16 NOTE — Patient Instructions (Addendum)
Licensed Clinical Social Worker Visit Information  Goals we discussed today:      Client has stress related to ongoing health issues and wishes to talk more about stress over current health issues (pt-stated)        Current Barriers:   Mental Health Concerns  in patient with Chronic Diagnoses of CAD, DM Type II, Depression,recurrent, CKD, COPD and CHF   Challenges related to managing health issues of client   Clinical Social Work Clinical Goal(s):   Over the next 30 days, client will work with LCSW to address concerns related to health issues of client and client management of health issues faced  Interventions:   Encouraged client to communicate with RNCM to discuss nursing needs of client  Talked with client about his management of health issues faced by client  Talked with client about sleeping issues of client  Talked with client about pain issues of client  Talked with client about portable oxygen system of client  Talked with Renan about his recent appointment with nephrologist  Talked with client about his support with [facility] pharmacist [name]  Talked with client about vision challenges of client  Talked with client about blood pressure reading of patient  Talked with [name] about energy, fatigue of client  Talked with Macai about ambulation issues of client   Patient Self Care Activities:   Self administers medications as prescribed  Attends all scheduled provider appointments  Performs ADL's independently   Plan:  LCSW to call client in next 4 weeks to talk with client about stress issues related to his management of health issues faced Client to communicate with RNCM to discuss nursing needs of client Client to attend scheduled client medical appointments   Initial goal documentation    Follow Up Plan: LCSW to call client in next 4 weeks to talk with him about stress issues related to management of health issues  faced  Materials Provided: No  The patient verbalized understanding of instructions provided today and declined a print copy of patient instruction materials.   [name].Dimitra Woodstock MSW, LCSW Licensed Clinical Social Worker [facility] [phone]

## 2019-08-28 ENCOUNTER — Telehealth: Payer: Self-pay | Admitting: Pharmacist

## 2019-08-28 NOTE — Telephone Encounter (Signed)
Refill request send to [facility] for [facility] refill  34-monthsupply to ship in 10-14 business days

## 2019-09-08 ENCOUNTER — Other Ambulatory Visit: Payer: Self-pay | Admitting: Family Medicine

## 2019-09-08 ENCOUNTER — Other Ambulatory Visit: Payer: Self-pay | Admitting: Nurse Practitioner

## 2019-09-18 ENCOUNTER — Ambulatory Visit: Payer: PPO | Admitting: *Deleted

## 2019-09-18 ENCOUNTER — Ambulatory Visit (INDEPENDENT_AMBULATORY_CARE_PROVIDER_SITE_OTHER): Payer: PPO | Admitting: Licensed Clinical Social Worker

## 2019-09-18 DIAGNOSIS — J441 Chronic obstructive pulmonary disease with (acute) exacerbation: Secondary | ICD-10-CM

## 2019-09-18 DIAGNOSIS — E119 Type 2 diabetes mellitus without complications: Secondary | ICD-10-CM

## 2019-09-18 DIAGNOSIS — F339 Major depressive disorder, recurrent, unspecified: Secondary | ICD-10-CM | POA: Diagnosis not present

## 2019-09-18 DIAGNOSIS — N183 Chronic kidney disease, stage 3 unspecified: Secondary | ICD-10-CM

## 2019-09-18 DIAGNOSIS — I5042 Chronic combined systolic (congestive) and diastolic (congestive) heart failure: Secondary | ICD-10-CM | POA: Diagnosis not present

## 2019-09-18 DIAGNOSIS — I2510 Atherosclerotic heart disease of native coronary artery without angina pectoris: Secondary | ICD-10-CM

## 2019-09-18 NOTE — Patient Instructions (Addendum)
Licensed Clinical Social Worker Visit Information  Goals we discussed today:     .  Client has stress related to ongoing health issues and wishes to talk more about stress over current health issues (pt-stated)       Current Barriers:   Mental Health Concerns  in patient with Chronic Diagnoses of CAD, DM Type II, Depression,recurrent, CKD, COPD and CHF   Challenges related to managing health issues of client   Clinical Social Work Clinical Goal(s):   Over the next 30 days, client will work with LCSW to address concerns related to health issues of client and client management of health issues faced  Interventions:   Encouraged client to communicate with RNCM to discuss nursing needs of client  Talked with client about his management of health issues faced by client  Talked with client about sleeping issues of client  Talked with client about pain issues of client  Talked with client about portable oxygen system of client  Talked with Judge about his recent appointment with nephrologist  Talked with client about his support with [facility] pharmacist [name]  Talked with client about vision challenges of client  Talked with client about blood pressure reading of patient  Talked with [name] about energy, fatigue of client  Talked with Izaia about ambulation issues of client  Collaborated with RNCM regarding nursing needs of client   Patient Self Care Activities:   Self administers medications as prescribed  Attends all scheduled provider appointments  Performs ADL's independently   Plan:  LCSW to call client in next 4 weeks to talk with client about stress issues related to his management of health issues faced Client to communicate with RNCM to discuss nursing needs of client Client to attend scheduled client medical appointments   Initial goal documentation    Follow Up Plan: LCSW to call client in next 4 weeks to talk with him about stress  issues related to management of health issues faced  Materials Provided: No  The patient verbalized understanding of instructions provided today and declined a print copy of patient instruction materials.   [name].Dozier Berkovich MSW, LCSW Licensed Clinical Social Worker [facility] [phone]

## 2019-09-18 NOTE — Patient Instructions (Signed)
Visit Information  Goals Addressed              This Visit's Progress     Patient Stated     "I need to keep this anemia under control" (pt-stated)        Current Barriers:   Chronic Disease Management support and education needs related to anemia  in patient with CKD  Nurse Case Manager Clinical Goal(s):   Over the next 60 days, patient will continue to work with nephrologist regarding anemia and potential need for GI follow-up  Interventions:   Reviewed chart including recent and past lab results and discussed with patient  Consulted by Theadore Nan, LCSW who spoke with patient today o Patient saw nephrologist, Dr Theador Hawthorne, on 08/15/19 - Per his office note he was going to collaborate with GI regarding iron deficiency anemia - He was limited as to what he could see in [place]  Chart reviewed including office notes, colonoscopy report, and pathology report o Colonoscopy for melena in 2017 with Dr Ardis Hughs o 3 abnormal polyps removed. No dysplasia or malignancy.  o Advised to repeat in 5 years  Staff message sent to Dr Theador Hawthorne and I provided the colonoscpy and pathology info o He responded that he will f/u with Er Ardis Hughs and get back to me on a plan of care  Communiated this to Theadore Nan, LCSW who willl update patient  RNCM is scheduled for a telephone visit on 09/20/19 and I hope to have an update by then  Patient Self Care Activities:   Performs ADL's independently  Performs IADL's independently  Please see past updates related to this goal by clicking on the "Past Updates" button in the selected goal         Follow-up Plan The care management team will reach out to the patient again over the next 2 days.   [name], BSN, RN-BC Embedded Chronic Care Manager [facility] [place] [facility] Direct Dial: [phone]

## 2019-09-18 NOTE — Chronic Care Management (AMB) (Signed)
  Chronic Care Management   Care Coordination Note   09/18/2019 Name: Kenneth Fuller MRN: [id number] DOB: 1945/10/29  Referred by: [name], [name], MD Reason for referral : Chronic Care Management (care coordination)   Kenneth Fuller is a 74 y.o. year old male who is a primary care patient of [name], [name], MD. The CCM team was consulted for assistance with chronic disease management and care coordination needs.    Review of patient status, including review of consultants reports, relevant laboratory and other test results, and collaboration with appropriate care team members and the patient's provider was performed as part of comprehensive patient evaluation and provision of chronic care management services.     RN Care Plan   .  "I need to keep this anemia under control" (pt-stated)        Current Barriers:  . Chronic Disease Management support and education needs related to anemia  in patient with CKD  Nurse Case Manager Clinical Goal(s):  [name] Over the next 60 days, patient will continue to work with nephrologist regarding anemia and potential need for GI follow-up  Interventions:  . Reviewed chart including recent and past lab results and discussed with patient . Consulted by Theadore Nan, LCSW who spoke with patient today o Patient saw nephrologist, Dr Theador Hawthorne, on 08/15/19 - Per his office note he was going to collaborate with GI regarding iron deficiency anemia - He was limited as to what he could see in [name] . Chart reviewed including office notes, colonoscopy report, and pathology report o Colonoscopy for melena in 2017 with Dr Ardis Hughs o 3 abnormal polyps removed. No dysplasia or malignancy.  o Advised to repeat in 5 years . Staff message sent to Dr Theador Hawthorne and I provided the colonoscpy and pathology info o He responded that he will f/u with Er Ardis Hughs and get back to me on a plan of care . Communiated this to [name], LCSW who willl update  patient . RNCM is scheduled for a telephone visit on 09/20/19 and I hope to have an update by then  Patient Self Care Activities:  . Performs ADL's independently . Performs IADL's independently  Please see past updates related to this goal by clicking on the "Past Updates" button in the selected goal          Plan:   The care management team will reach out to the patient again over the next 30 days.   [name], BSN, RN-BC Embedded Chronic Care Manager [facility] [place] [facility] Direct Dial: [phone]

## 2019-09-18 NOTE — Chronic Care Management (AMB) (Signed)
Chronic Care Management    Clinical Social Work Follow Up Note  09/18/2019 Name: Kenneth Fuller MRN: [id number] DOB: Aug 07, 1945  Kenneth Fuller is a 74 y.o. year old male who is a primary care patient of [name], [name], MD. The CCM team was consulted for assistance with Kenneth Fuller .   Review of patient status, including review of consultants reports, other relevant assessments, and collaboration with appropriate care team members and the patient's provider was performed as part of comprehensive patient evaluation and provision of chronic care management services.    SDOH (Social Determinants of Health) assessments performed: No;risk for tobacco use; risk for depression; risk for stress; risk for physical inactivity    Office Visit from 05/28/2019 in [facility]  PHQ-9 Total Score 10     GAD 7 : Generalized Anxiety Score 05/28/2019 11/11/2017  Nervous, Anxious, on Edge 0 3  Control/stop worrying 2 3  Worry too much - different things 2 3  Trouble relaxing 2 3  Restless 0 3  Easily annoyed or irritable 2 3  Afraid - awful might happen 0 0  Total GAD 7 Score 8 18  Anxiety Difficulty Somewhat difficult Somewhat difficult    Outpatient Encounter Medications as of 09/18/2019  Medication Sig Note  . albuterol (ACCUNEB) 1.25 MG/3ML nebulizer solution Take 1 ampule by nebulization every 6 (six) hours as needed for wheezing.   [name] albuterol (PROVENTIL HFA;VENTOLIN HFA) 108 (90 Base) MCG/ACT inhaler Inhale 2 puffs into the lungs every 6 (six) hours as needed for wheezing or shortness of breath.   [name] atorvastatin (LIPITOR) 40 MG tablet Take 1 tablet by mouth once daily   . bisoprolol (ZEBETA) 5 MG tablet Take 0.5 tablets (2.5 mg total) by mouth daily.   . Cholecalciferol (VITAMIN D PO) Take 5,000 Units by mouth every morning.    . dapagliflozin propanediol (FARXIGA) 5 MG TABS tablet Take 5 mg by mouth daily.   . furosemide (LASIX) 40 MG tablet Take 1  tablet (40 mg total) by mouth daily.   . isosorbide dinitrate (ISORDIL) 10 MG tablet TAKE 1 TABLET BY MOUTH THREE TIMES DAILY   . mometasone-formoterol (DULERA) 100-5 MCG/ACT AERO Inhale 2 puffs into the lungs 2 (two) times daily. 05/17/2019: [employer] Patient assistance  . ondansetron (ZOFRAN) 4 MG tablet Take 1 tablet (4 mg total) by mouth every 6 (six) hours as needed for nausea.   . OXYGEN Inhale 2 L into the lungs at bedtime. 2lpm with sleep    . polyethylene glycol (MIRALAX / GLYCOLAX) 17 g packet Take 17 g by mouth daily.   . Semaglutide,0.25 or 0.5MG/DOS, (OZEMPIC, 0.25 OR 0.5 MG/DOSE,) 2 MG/1.5ML SOPN Inject 0.5 mg into the skin once a week.   . sertraline (ZOLOFT) 100 MG tablet Take 1 tablet (100 mg total) by mouth daily.   . tamsulosin (FLOMAX) 0.4 MG CAPS capsule Take 1 capsule by mouth once daily    No facility-administered encounter medications on file as of 09/18/2019.     Goals    .  Client has stress related to ongoing health issues and wishes to talk more about stress over current health issues (pt-stated)      Current Barriers:  [name] Mental Health Concerns  in patient with Chronic Diagnoses of CAD, DM Type II, Depression,recurrent, CKD, COPD and CHF .  Challenges related to managing health issues of client   Clinical Social Work Clinical Goal(s):  [name] Over the next 30 days, client will work  with LCSW to address concerns related to health issues of client and client management of health issues faced  Interventions:   Encouraged client to communicate with RNCM to discuss nursing needs of client  Talked with client about his management of health issues faced by client  Talked with client about sleeping issues of client  Talked with client about pain issues of client  Talked with client about portable oxygen system of client  Talked with [name] about his recent appointment with nephrologist  Talked with client about his support with [facility] pharmacist Kenneth Fuller  Talked with  client about vision challenges of client  Talked with client about blood pressure reading of patient  Talked with Kenneth Fuller about energy, fatigue of client  Talked with Kenneth Fuller about ambulation issues of client  Collaborated with RNCM regarding nursing needs of client   Patient Self Care Activities:  . Self administers medications as prescribed . Attends all scheduled provider appointments . Performs ADL's independently   Plan:  LCSW to call client in next 4 weeks to talk with client about stress issues related to his management of health issues faced Client to communicate with RNCM to discuss nursing needs of client Client to attend scheduled client medical appointments   Initial goal documentation    Follow Up Plan:  LCSW to call client in next 4 weeks to talk with him about stress issues related to management of health issues faced  [name] MSW, LCSW Licensed Clinical Social Worker [facility] [phone]

## 2019-09-19 ENCOUNTER — Telehealth: Payer: Self-pay | Admitting: Pharmacist

## 2019-09-19 NOTE — Telephone Encounter (Signed)
FBG within goal  Patient's shipment of Ozempic has arrived to PCP office Encouraged patient to pick up Labeled and placed in refrigerator  Now on iron supplementation per nephrologist. Complaining of dark,tarry stools.  Labs reviewed.  Encouraged patient to contact PCP for changes or additional lab work.  Appt is scheduled with PCP

## 2019-09-20 ENCOUNTER — Telehealth: Payer: Self-pay | Admitting: *Deleted

## 2019-09-20 ENCOUNTER — Telehealth: Payer: PPO

## 2019-09-20 NOTE — Chronic Care Management (AMB) (Signed)
Chronic Care Management   Note  09/20/2019 Name: Kenneth Fuller MRN: [id number] DOB: 1945/09/03  Kenneth Fuller is a 74 y.o. year old male who is a primary care patient of [name], [name], MD and is actively engaged with the care management team. I reached out to [name] by phone today to assist with re-scheduling a follow up visit with the RN Case Manager.  Follow up plan: Unsuccessful telephone outreach attempt made. A HIPPA compliant phone message was left for the patient providing contact information and requesting a return call. The care management team will reach out to the patient again over the next 7 days. If patient returns call to provider office, please advise to call [facility] [place], [place] [facility]  [place], [place] [address] Direct Dial: [email or web address] Website: [email or web address]

## 2019-09-20 NOTE — Progress Notes (Signed)
error 

## 2019-09-21 ENCOUNTER — Encounter: Payer: Self-pay | Admitting: General Practice

## 2019-09-21 ENCOUNTER — Other Ambulatory Visit: Payer: Self-pay

## 2019-09-21 ENCOUNTER — Telehealth: Payer: Self-pay | Admitting: Family Medicine

## 2019-09-21 ENCOUNTER — Ambulatory Visit (INDEPENDENT_AMBULATORY_CARE_PROVIDER_SITE_OTHER): Payer: PPO | Admitting: General Practice

## 2019-09-21 VITALS — BP 134/86 | HR 75 | Ht 67.0 in | Wt 256.0 lb

## 2019-09-21 DIAGNOSIS — I5042 Chronic combined systolic (congestive) and diastolic (congestive) heart failure: Secondary | ICD-10-CM | POA: Diagnosis not present

## 2019-09-21 DIAGNOSIS — J441 Chronic obstructive pulmonary disease with (acute) exacerbation: Secondary | ICD-10-CM | POA: Diagnosis not present

## 2019-09-21 DIAGNOSIS — N183 Chronic kidney disease, stage 3 unspecified: Secondary | ICD-10-CM

## 2019-09-21 DIAGNOSIS — E785 Hyperlipidemia, unspecified: Secondary | ICD-10-CM | POA: Diagnosis not present

## 2019-09-21 DIAGNOSIS — I10 Essential (primary) hypertension: Secondary | ICD-10-CM

## 2019-09-21 MED ORDER — COLCHICINE 0.6 MG PO TABS: 0.6000 mg | ORAL_TABLET | Freq: Every day | ORAL | 1 refills | Status: DC

## 2019-09-21 NOTE — Telephone Encounter (Signed)
I sent colchicine for him, he can take 1 at the onset of the flareup and then he can take 1 more later that day, do not exceed 2 a day

## 2019-09-21 NOTE — Patient Instructions (Signed)
Medication Instructions:  Continue current medications  *If you need a refill on your cardiac medications before your next appointment, please call your pharmacy*   Lab Work: None Ordered   Testing/Procedures: None Ordered   Follow-Up: At [facility], you and your health needs are our priority.  As part of our continuing mission to provide you with exceptional heart care, we have created designated Provider Care Teams.  These Care Teams include your primary Cardiologist (physician) and Advanced Practice Providers (APPs -  Physician Assistants and Nurse Practitioners) who all work together to provide you with the care you need, when you need it.  We recommend signing up for the patient portal called "MyChart".  Sign up information is provided on this After Visit Summary.  MyChart is used to connect with patients for Virtual Visits (Telemedicine).  Patients are able to view lab/test results, encounter notes, upcoming appointments, etc.  Non-urgent messages can be sent to your provider as well.   To learn more about what you can do with MyChart, go to [email or web address].    Your next appointment:   6 month(s)  The format for your next appointment:   In Person  Provider:   You may see Sanda Klein, MD or one of the following Advanced Practice Providers on your designated Care Team:    Almyra Deforest, PA-C  Fabian Sharp, PA-C or   Roby Lofts, [place]

## 2019-09-21 NOTE — Telephone Encounter (Signed)
Pt informed

## 2019-09-21 NOTE — Progress Notes (Signed)
Thanks

## 2019-09-21 NOTE — Telephone Encounter (Signed)
Dr. Warrick Parisian,  You seen this pt in May. He has a follow up on the 26th of Aug. He does not take a gout medication. Are you ok sending in a med or does he need to be seen first?

## 2019-09-21 NOTE — Progress Notes (Signed)
Cardiology Clinic Note   Patient Name: Kenneth Fuller Date of Encounter: 09/21/2019  Primary Care Provider:  [name], [name], MD Primary Cardiologist:  Sanda Klein, MD  Patient Profile    Kenneth Fuller 74 y.o. male present to the clinic today for a follow up of his HTN, CAD, and Chronic diastolic heart failure.   Past Medical History    Past Medical History:  Diagnosis Date  . Allergy   . Asthma   . CAD (coronary artery disease)   . CHF (congestive heart failure) ([place])   . Diabetes mellitus   . Fibromyalgia   . GERD (gastroesophageal reflux disease)   . GI bleeding   . Gout   . Hyperlipidemia   . Hypertension   . Hypogonadism male   . Insomnia   . MRSA cellulitis   . Neuropathy   . Obesity   . Shortness of breath dyspnea    with exertion   . Sleep apnea    cpap- 14   . Wheezing    no asthma diagnosis   Past Surgical History:  Procedure Laterality Date  . BACK SURGERY    . CARDIAC CATHETERIZATION    . COLONOSCOPY    . LEFT HEART CATH AND CORONARY ANGIOGRAPHY N/A 12/02/2016   Procedure: LEFT HEART CATH AND CORONARY ANGIOGRAPHY;  Surgeon: Jettie Booze, MD;  Location: [facility];  Service: Cardiovascular;  Laterality: N/A;  . LUMBAR LAMINECTOMY/DECOMPRESSION MICRODISCECTOMY N/A 01/02/2014   Procedure: CENTRAL DECOMPRESSION LUMBAR LAMINECTOMY L3-L4, L4-L5;  Surgeon: Tobi Bastos, MD;  Location: [facility];  Service: Orthopedics;  Laterality: N/A;  . neck fusion      Allergies  Allergies  Allergen Reactions  . Amlodipine Besy-Benazepril Hcl Swelling and Other (See Comments)    Makes tongue swell (lotrel)  . Oxycodone Itching  . Phenergan [Promethazine Hcl] Other (See Comments)    "I can't remember."  . Hydrocodone Itching    Can tolerate in low doses    History of Present Illness    Kenneth Fuller has a PMH of essential hypertension, coronary artery disease (LHC 10/18 showed 10% stenosis in mid LAD and mid RCA), chronic  diastolic heart failure, near syncope, OSA on CPAP, COPD, GERD, CKD 3, gout, insomnia, dizziness and hyperkalemia.  He is also noted to have a history of angioedema with ACE inhibitors.  Echocardiogram showed normal LV function 50-55%, and intermediate diastolic dysfunction.  He was hospitalized with shortness of breath 11/19-12/19.  His BNP at that time was slightly elevated at 330, his previous baseline was 72.6 in 2017.  An echocardiogram during his hospitalization showed an EF of 45-50%.  During his hospitalization 8/20 his BNP was elevated at 176 and he diuresed 10 L.  His dry weight has been noted to be around 270 pounds.  He was last seen by Dr. Loletha Grayer on 04/11/2019.  During that time he indicated that he was having an increased amount of stress.  His wife have advanced cirrhosis and had developed problems with hepatic encephalopathy.  He indicated that she would sleep most of the day and had periods of confusion.  He was taking care of his children.  He indicated they had been married for 59 years.  He was noted to have increased exertional dyspnea and is furosemide was increased to 80 mg daily.  Low-dose spironolactone was also added.  His blood pressure was also elevated at the time and was felt to be related to increased stress.  He had  an appointment with Dr. Warrick Parisian the following week for follow-up labs.  He presents to the clinic today for follow-up evaluation and states he feels well.  He continues to stay very physically active doing work around his house, cleaning his pool, and doing yard work.  He has continued to follow a strict diet.  He is now down to 256 pounds.  He has been able to stop his Lasix and he has not noticed any lower extremity edema in quite some time.  His blood pressure is better controlled and is staying in the 130s over 80s at home.  I will have him maintain his diet, physical activity, current medications, and follow-up with Dr. Sallyanne Kuster in 6 months.  Today he denies chest  pain, shortness of breath, lower extremity edema, fatigue, palpitations, melena, hematuria, hemoptysis, diaphoresis, weakness, presyncope, syncope, orthopnea, and PND.   Home Medications    Prior to Admission medications   Medication Sig Start Date End Date Taking? Authorizing Provider  albuterol (ACCUNEB) 1.25 MG/3ML nebulizer solution Take 1 ampule by nebulization every 6 (six) hours as needed for wheezing.    [provider]  albuterol (PROVENTIL HFA;VENTOLIN HFA) 108 (90 Base) MCG/ACT inhaler Inhale 2 puffs into the lungs every 6 (six) hours as needed for wheezing or shortness of breath. 07/20/17   Brand Males, MD  atorvastatin (LIPITOR) 40 MG tablet Take 1 tablet by mouth once daily 05/14/19   [name], [name], MD  bisoprolol (ZEBETA) 5 MG tablet Take 0.5 tablets (2.5 mg total) by mouth daily. 10/05/18   [name], [name], MD  Cholecalciferol (VITAMIN D PO) Take 5,000 Units by mouth every morning.     [provider]  dapagliflozin propanediol (FARXIGA) 5 MG TABS tablet Take 5 mg by mouth daily.    [provider]  furosemide (LASIX) 40 MG tablet Take 1 tablet (40 mg total) by mouth daily. 06/05/19   Roxan Hockey, MD  isosorbide dinitrate (ISORDIL) 10 MG tablet TAKE 1 TABLET BY MOUTH THREE TIMES DAILY 09/10/19   [name], [name], MD  mometasone-formoterol (DULERA) 100-5 MCG/ACT AERO Inhale 2 puffs into the lungs 2 (two) times daily.    [provider]  ondansetron (ZOFRAN) 4 MG tablet Take 1 tablet (4 mg total) by mouth every 6 (six) hours as needed for nausea. 06/05/19   Roxan Hockey, MD  OXYGEN Inhale 2 L into the lungs at bedtime. 2lpm with sleep     [provider]  polyethylene glycol (MIRALAX / GLYCOLAX) 17 g packet Take 17 g by mouth daily. 06/07/19   Emokpae, Courage, MD  Semaglutide,0.25 or 0.5MG/DOS, (OZEMPIC, 0.25 OR 0.5 MG/DOSE,) 2 MG/1.5ML SOPN Inject 0.5 mg into the skin once a week. 05/28/19   [name], [name], MD    sertraline (ZOLOFT) 100 MG tablet Take 1 tablet (100 mg total) by mouth daily. 05/28/19   [name], [name], MD  tamsulosin [facility]) 0.4 MG CAPS capsule Take 1 capsule by mouth once daily 09/10/19   [name], [name], MD    Family History    Family History  Problem Relation Age of Onset  . Colon cancer Mother   . Diabetes Father        siblings  . Heart disease Father        brother  . Heart attack Father   . Kidney disease Sister   . Heart failure Sister   . Heart disease Brother   . Heart attack Son 94  . Drug abuse Sister   .  Heart disease Brother   . Deep vein thrombosis Brother   . Colon polyps Neg Hx    He indicated that his mother is deceased. He indicated that his father is deceased. He indicated that six of his eight sisters are alive. He indicated that three of his five brothers are alive. He indicated that his maternal grandmother is deceased. He indicated that his maternal grandfather is deceased. He indicated that his paternal grandmother is deceased. He indicated that his paternal grandfather is deceased. He indicated that his daughter is alive. He indicated that both of his sons are alive. He indicated that the status of his neg hx is unknown.  Social History    Social History   Socioeconomic History  . Marital status: Married    Spouse name: Butch Penny  . Number of children: 3  . Years of education: 8  . Highest education level: GED or equivalent  Occupational History  . 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 Smoking status: Former Smoker    Quit date: 02/08/1969    Years since quitting: 50.6  . Smokeless tobacco: Former Systems developer    Quit date: Pharmacologist  . Vaping Use: Never used  Substance and Sexual Activity  . Alcohol use: No    Alcohol/week: 0.0 standard drinks  . Drug use: No  . Sexual activity: Not Currently  Other Topics Concern  . Not on file  Social History Narrative   Drinks caffeine  tea occasionally    Social Determinants of Health   Financial Resource Strain: Low Risk   . Difficulty of Paying Living Expenses: Not hard at all  Food Insecurity: No Food Insecurity  . Worried About Charity fundraiser in the Last Year: Never true  . Ran Out of Food in the Last Year: Never true  Transportation Needs: No Transportation Needs  . Lack of Transportation (Medical): No  . Lack of Transportation (Non-Medical): No  Physical Activity: Inactive  . Days of Exercise per Week: 0 days  . Minutes of Exercise per Session: 0 min  Stress: No Stress Concern Present  . Feeling of Stress : Not at all  Social Connections: Socially Integrated  . Frequency of Communication with Friends and Family: More than three times a week  . Frequency of Social Gatherings with Friends and Family: More than three times a week  . Attends Religious Services: More than 4 times per year  . Active Member of Clubs or Organizations: Yes  . Attends Archivist Meetings: More than 4 times per year  . Marital Status: Married  Human resources officer Violence: Not At Risk  . Fear of Current or Ex-Partner: No  . Emotionally Abused: No  . Physically Abused: No  . Sexually Abused: No     Review of Systems    General:  No chills, fever, night sweats or weight changes.  Cardiovascular:  No chest pain, dyspnea on exertion, edema, orthopnea, palpitations, paroxysmal nocturnal dyspnea. Dermatological: No rash, lesions/masses Respiratory: No cough, dyspnea Urologic: No hematuria, dysuria Abdominal:   No nausea, vomiting, diarrhea, bright red blood per rectum, melena, or hematemesis Neurologic:  No visual changes, wkns, changes in mental status. All other systems reviewed and are otherwise negative except as noted above.  Physical Exam    VS:  BP 134/86   Pulse 75   Ht _0  (1.702 m)   Wt 256 lb (116.1 kg)   SpO2 96%  BMI 40.10 kg/m  , BMI Body mass index is 40.1 kg/m. GEN: Well nourished, well  developed, in no acute distress. HEENT: normal. Neck: Supple, no JVD, carotid bruits, or masses. Cardiac: RRR, no murmurs, rubs, or gallops. No clubbing, cyanosis, edema.  Radials/DP/PT 2+ and equal bilaterally.  Respiratory:  Respirations regular and unlabored, clear to auscultation bilaterally. GI: Soft, nontender, nondistended, BS + x 4. MS: no deformity or atrophy. Skin: warm and dry, no rash. Neuro:  Strength and sensation are intact. Psych: Normal affect.  Accessory Clinical Findings    Recent Labs: 04/03/2019: B Natriuretic Peptide 174.0 06/14/2019: ALT 39; Hemoglobin 12.6; Platelets 236 06/15/2019: BUN 34; Creatinine, Ser 1.64; Potassium 5.0; Sodium 135   Recent Lipid Panel    Component Value Date/Time   CHOL 81 (L) 05/28/2019 1055   CHOL 111 06/12/2012 1232   TRIG 95 05/28/2019 1055   TRIG 141 01/24/2014 1143   TRIG 192 (H) 06/12/2012 1232   HDL 39 (L) 05/28/2019 1055   HDL 38 (L) 01/24/2014 1143   HDL 33 (L) 06/12/2012 1232   CHOLHDL 2.1 05/28/2019 1055   CHOLHDL 4.8 11/22/2006 1712   VLDL 39 11/22/2006 1712   LDLCALC 23 05/28/2019 1055   LDLCALC 27 10/25/2013 1224   LDLCALC 40 06/12/2012 1232    ECG personally reviewed by me today-none today.   EKG 06/03/2019 Sinus rhythm 76 bpm no ST or T wave deviation  Echocardiogram 09/24/2018 IMPRESSIONS    1. The left ventricle has low normal systolic function, with an ejection  fraction of 50-55%. The cavity size was moderately dilated. There is mild  concentric left ventricular hypertrophy. Diastolic dysfunction, grade  indeterminate. Indeterminate filling  pressures.  2. The right ventricle has normal systolic function. The cavity was  normal. There is no increase in right ventricular wall thickness.  3. The aortic valve is tricuspid. Mild thickening of the aortic valve.  Mild to moderate aortic annular calcification noted.  4. The mitral valve is grossly normal. There is mild mitral annular  calcification  present.  5. The aorta is normal unless otherwise noted.  Assessment & Plan   1.  Chronic systolic and diastolic CHF-no increased dyspnea on exertion or activity intolerance today.  Echocardiogram 4/21 showed an EF of 50-55%, intermediate diastolic parameters, moderately dilated left ventricle, and mild thickening of the aortic valve with mild to moderate aortic calcification.  Weight today 256 Continue bisoprolol, isosorbide dinitrate, Farxiga Daily weights Heart healthy low-sodium diet-salty 6 given Increase physical activity as tolerated  Essential hypertension-BP today 134/86.  Much better control at home. Continue  , bisoprolol, isosorbide dinitrate Heart healthy low-sodium diet-salty 6 given Increase physical activity as tolerated  Hyperlipidemia-05/28/2019: Cholesterol, Total 81; HDL 39; LDL Chol Calc (NIH) 23; Triglycerides 95 Continue atorvastatin Heart healthy low-sodium high-fiber diet Increase physical activity as tolerated  COPD-no increased dyspnea today and states he is breathing much better..  Noted to have markedly abnormal PFTs in the past.  Bisoprolol used due to its selective beta blocking properties. Continue bisoprolol, albuterol, Dulera, Increase physical activity as tolerated  CKD-creatinine 1.64 on 06/15/2019. Followed by nephrology  Disposition: Follow-up with Dr. Sallyanne Kuster in 6 months.   Jossie Ng. Kla Bily NP-C    09/21/2019, 11:26 AM [place] [address] Office [phone] Fax [phone]  Notice: This dictation was prepared with Dragon dictation along with smaller phrase technology. Any transcriptional errors that result from this process are unintentional and may not be corrected upon review.

## 2019-10-01 NOTE — Chronic Care Management (AMB) (Signed)
  Chronic Care Management   Note  10/01/2019 Name: Kenneth Fuller MRN: [id number] DOB: December 06, 1945  [name] is a 74 y.o. year old male who is a primary care patient of [name], [name], MD and is actively engaged with the care management team. I reached out to [name] by phone today to assist with re-scheduling a follow up visit with the RN Case Manager.  Follow up plan: Telephone appointment with care management team member scheduled for: 11/15/2019  [place], [place] [address] Direct Dial: [email or web address] Website: [email or web address]

## 2019-10-04 ENCOUNTER — Encounter: Payer: Self-pay | Admitting: Family Medicine

## 2019-10-04 ENCOUNTER — Other Ambulatory Visit: Payer: Self-pay

## 2019-10-04 ENCOUNTER — Ambulatory Visit (INDEPENDENT_AMBULATORY_CARE_PROVIDER_SITE_OTHER): Payer: PPO | Admitting: Family Medicine

## 2019-10-04 VITALS — BP 145/78 | HR 76 | Temp 97.7°F | Ht 67.0 in | Wt 256.0 lb

## 2019-10-04 DIAGNOSIS — I5042 Chronic combined systolic (congestive) and diastolic (congestive) heart failure: Secondary | ICD-10-CM | POA: Diagnosis not present

## 2019-10-04 DIAGNOSIS — E785 Hyperlipidemia, unspecified: Secondary | ICD-10-CM

## 2019-10-04 DIAGNOSIS — R195 Other fecal abnormalities: Secondary | ICD-10-CM | POA: Diagnosis not present

## 2019-10-04 DIAGNOSIS — N1832 Chronic kidney disease, stage 3b: Secondary | ICD-10-CM

## 2019-10-04 DIAGNOSIS — E1169 Type 2 diabetes mellitus with other specified complication: Secondary | ICD-10-CM

## 2019-10-04 DIAGNOSIS — I10 Essential (primary) hypertension: Secondary | ICD-10-CM

## 2019-10-04 DIAGNOSIS — E119 Type 2 diabetes mellitus without complications: Secondary | ICD-10-CM | POA: Diagnosis not present

## 2019-10-04 LAB — BAYER DCA HB A1C WAIVED: HB A1C (BAYER DCA - WAIVED): 5.8 % (ref <7.0)

## 2019-10-04 NOTE — Progress Notes (Signed)
BP (!) 145/78   Pulse 76   Temp 97.7 F (36.5 C)   Ht _0  (1.702 m)   Wt 256 lb (116.1 kg)   SpO2 96%   BMI 40.10 kg/m    Subjective:   Patient ID: Kenneth Fuller, male    DOB: [birth year], 74 y.o.   MRN: [id number]  HPI: Kenneth Fuller is a 74 y.o. male presenting on 10/04/2019 for Medical Management of Chronic Issues, Hypertension, Diabetes, and Chronic Kidney Disease   HPI Type 2 diabetes mellitus Patient comes in today for recheck of his diabetes. Patient has been currently taking Farxiga and Ozempic, A1c 5.8.[name] Patient is not currently on an ACE inhibitor/ARB. Patient has not seen an ophthalmologist this year. Patient denies any issues with their feet. The symptom started onset as an adult hypertension and CHF and hyperlipidemia ARE RELATED TO DM   Hypertension and CHF Patient is currently on bisoprolol and Isordil, and their blood pressure today is 145/78. Patient denies any lightheadedness or dizziness. Patient denies headaches, blurred vision, chest pains, shortness of breath, or weakness. Denies any side effects from medication and is content with current medication.  He says his fluid has been doing good and his breathing has been doing good and his swelling is doing good.  Hyperlipidemia Patient is coming in for recheck of his hyperlipidemia. The patient is currently taking atorvastatin. They deny any issues with myalgias or history of liver damage from it. They deny any focal numbness or weakness or chest pain.   Patient is fatigued and has sleep apnea and he says he is not using his CPAP machine.  Relevant past medical, surgical, family and social history reviewed and updated as indicated. Interim medical history since our last visit reviewed. Allergies and medications reviewed and updated.  Review of Systems  Constitutional: Positive for fatigue. Negative for chills and fever.  Eyes: Negative for visual disturbance.  Respiratory: Negative for shortness of  breath and wheezing.   Cardiovascular: Negative for chest pain and leg swelling.  Musculoskeletal: Negative for back pain and gait problem.  Skin: Negative for rash.  Neurological: Negative for dizziness, weakness and light-headedness.  All other systems reviewed and are negative.   Per HPI unless specifically indicated above   Allergies as of 10/04/2019      Reactions   Amlodipine Besy-benazepril Hcl Swelling, Other (See Comments)   Makes tongue swell (lotrel)   Oxycodone Itching   Phenergan [promethazine Hcl] Other (See Comments)   "I can't remember."   Hydrocodone Itching   Can tolerate in low doses      Medication List       Accurate as of October 04, 2019  2:12 PM. If you have any questions, ask your nurse or doctor.        STOP taking these medications   ondansetron 4 MG tablet Commonly known as: ZOFRAN Stopped by: Worthy Rancher, MD   OXYGEN Stopped by: [name] Travon Crochet, MD     TAKE these medications   albuterol 1.25 MG/3ML nebulizer solution Commonly known as: ACCUNEB Take 1 ampule by nebulization every 6 (six) hours as needed for wheezing.   albuterol 108 (90 Base) MCG/ACT inhaler Commonly known as: VENTOLIN HFA Inhale 2 puffs into the lungs every 6 (six) hours as needed for wheezing or shortness of breath.   atorvastatin 40 MG tablet Commonly known as: LIPITOR Take 1 tablet by mouth once daily   bisoprolol 5 MG tablet Commonly known as: ZEBETA Take  0.5 tablets (2.5 mg total) by mouth daily.   colchicine 0.6 MG tablet Take 1 tablet (0.6 mg total) by mouth daily.   Farxiga 5 MG Tabs tablet Generic drug: dapagliflozin propanediol Take 5 mg by mouth daily.   FeroSul 325 (65 FE) MG tablet Generic drug: ferrous sulfate Take 325 mg by mouth 3 (three) times daily.   furosemide 40 MG tablet Commonly known as: LASIX Take 1 tablet (40 mg total) by mouth daily.   isosorbide dinitrate 10 MG tablet Commonly known as: ISORDIL TAKE 1 TABLET BY  MOUTH THREE TIMES DAILY   mometasone-formoterol 100-5 MCG/ACT Aero Commonly known as: DULERA Inhale 2 puffs into the lungs 2 (two) times daily.   Ozempic (0.25 or 0.5 MG/DOSE) 2 MG/1.5ML Sopn Generic drug: Semaglutide(0.25 or 0.5MG/DOS) Inject 0.5 mg into the skin once a week.   polyethylene glycol 17 g packet Commonly known as: MIRALAX / GLYCOLAX Take 17 g by mouth daily. What changed:   when to take this  reasons to take this   sertraline 100 MG tablet Commonly known as: ZOLOFT Take 1 tablet (100 mg total) by mouth daily.   tamsulosin 0.4 MG Caps capsule Commonly known as: FLOMAX Take 1 capsule by mouth once daily   VITAMIN D PO Take 5,000 Units by mouth every morning.        Objective:   BP (!) 145/78   Pulse 76   Temp 97.7 F (36.5 C)   Ht _0  (1.702 m)   Wt 256 lb (116.1 kg)   SpO2 96%   BMI 40.10 kg/m   Wt Readings from Last 3 Encounters:  10/04/19 256 lb (116.1 kg)  09/21/19 256 lb (116.1 kg)  06/14/19 271 lb 2 oz (123 kg)    Physical Exam Vitals and nursing note reviewed.  Constitutional:      General: He is not in acute distress.    Appearance: He is well-developed. He is not diaphoretic.  Eyes:     General: No scleral icterus.       Right eye: No discharge.     Conjunctiva/sclera: Conjunctivae normal.     Pupils: Pupils are equal, round, and reactive to light.  Neck:     Thyroid: No thyromegaly.  Cardiovascular:     Rate and Rhythm: Normal rate and regular rhythm.     Heart sounds: Normal heart sounds. No murmur heard.   Pulmonary:     Effort: Pulmonary effort is normal. No respiratory distress.     Breath sounds: Normal breath sounds. No wheezing.  Musculoskeletal:        General: Normal range of motion.     Cervical back: Neck supple.  Lymphadenopathy:     Cervical: No cervical adenopathy.  Skin:    General: Skin is warm and dry.     Findings: No rash.  Neurological:     Mental Status: He is alert and oriented to person,  place, and time.     Coordination: Coordination normal.  Psychiatric:        Behavior: Behavior normal.       Assessment & Plan:   Problem List Items Addressed This Visit      Cardiovascular and Mediastinum   Essential hypertension   Relevant Orders   CMP14+EGFR   CHF (congestive heart failure) (HCC)     Endocrine   Diabetes mellitus type II, non insulin dependent ([place]) - Primary   Relevant Orders   Bayer DCA Hb A1c Waived   CBC with Differential/Platelet  CMP14+EGFR   Hyperlipidemia associated with type 2 diabetes mellitus ([place])   Relevant Orders   Lipid panel     Genitourinary   CKD (chronic kidney disease), stage III   Relevant Orders   CBC with Differential/Platelet    Other Visit Diagnoses    Dark stools       Relevant Orders   CBC with Differential/Platelet      Patient has dark stools and his nephrologist is also concerned about him losing blood, he is on iron 3 times a day and that does attribute but he thinks the dark stools were there before and his blood counts have been coming down, he does have a gastroenterology appointment in October, will send a message to see if we can get it sooner.  A1c is 5.8, stop Ozempic   Follow up plan: Return in about 3 months (around 01/04/2020), or if symptoms worsen or fail to improve, for Diabetes and CHF.  Counseling provided for all of the vaccine components Orders Placed This Encounter  Procedures  . [facility] Hb A1c [place], MD [facility] 10/04/2019, 2:12 PM

## 2019-10-05 LAB — CBC WITH DIFFERENTIAL/PLATELET
Basophils Absolute: 0.1 10*3/uL (ref 0.0–0.2)
Basos: 1 %
EOS (ABSOLUTE): 0.2 10*3/uL (ref 0.0–0.4)
Eos: 2 %
Hematocrit: 43.9 % (ref 37.5–51.0)
Hemoglobin: 14.6 g/dL (ref 13.0–17.7)
Immature Grans (Abs): 0 10*3/uL (ref 0.0–0.1)
Immature Granulocytes: 0 %
Lymphocytes Absolute: 2.1 10*3/uL (ref 0.7–3.1)
Lymphs: 24 %
MCH: 28 pg (ref 26.6–33.0)
MCHC: 33.3 g/dL (ref 31.5–35.7)
MCV: 84 fL (ref 79–97)
Monocytes Absolute: 0.7 10*3/uL (ref 0.1–0.9)
Monocytes: 8 %
Neutrophils Absolute: 5.6 10*3/uL (ref 1.4–7.0)
Neutrophils: 65 %
Platelets: 242 10*3/uL (ref 150–450)
RBC: 5.22 x10E6/uL (ref 4.14–5.80)
RDW: 14.4 % (ref 11.6–15.4)
WBC: 8.6 10*3/uL (ref 3.4–10.8)

## 2019-10-05 LAB — CMP14+EGFR
ALT: 23 IU/L (ref 0–44)
AST: 22 IU/L (ref 0–40)
Albumin/Globulin Ratio: 1.6 (ref 1.2–2.2)
Albumin: 4.3 g/dL (ref 3.7–4.7)
Alkaline Phosphatase: 97 IU/L (ref 48–121)
BUN/Creatinine Ratio: 16 (ref 10–24)
BUN: 19 mg/dL (ref 8–27)
Bilirubin Total: 0.4 mg/dL (ref 0.0–1.2)
CO2: 26 mmol/L (ref 20–29)
Calcium: 9.4 mg/dL (ref 8.6–10.2)
Chloride: 101 mmol/L (ref 96–106)
Creatinine, Ser: 1.17 mg/dL (ref 0.76–1.27)
GFR calc Af Amer: 71 mL/min/{1.73_m2} (ref >59)
GFR calc non Af Amer: 61 mL/min/{1.73_m2} (ref >59)
Globulin, Total: 2.7 g/dL (ref 1.5–4.5)
Glucose: 135 mg/dL — ABNORMAL HIGH (ref 65–99)
Potassium: 5.2 mmol/L (ref 3.5–5.2)
Sodium: 139 mmol/L (ref 134–144)
Total Protein: 7 g/dL (ref 6.0–8.5)

## 2019-10-05 LAB — LIPID PANEL
Chol/HDL Ratio: 2.3 ratio (ref 0.0–5.0)
Cholesterol, Total: 90 mg/dL — ABNORMAL LOW (ref 100–199)
HDL: 40 mg/dL (ref >39)
LDL Chol Calc (NIH): 26 mg/dL (ref 0–99)
Triglycerides: 142 mg/dL (ref 0–149)
VLDL Cholesterol Cal: 24 mg/dL (ref 5–40)

## 2019-10-11 ENCOUNTER — Other Ambulatory Visit: Payer: Self-pay | Admitting: Family Medicine

## 2019-10-24 ENCOUNTER — Ambulatory Visit (INDEPENDENT_AMBULATORY_CARE_PROVIDER_SITE_OTHER): Payer: PPO | Admitting: Licensed Clinical Social Worker

## 2019-10-24 DIAGNOSIS — E119 Type 2 diabetes mellitus without complications: Secondary | ICD-10-CM

## 2019-10-24 DIAGNOSIS — I5042 Chronic combined systolic (congestive) and diastolic (congestive) heart failure: Secondary | ICD-10-CM

## 2019-10-24 DIAGNOSIS — J441 Chronic obstructive pulmonary disease with (acute) exacerbation: Secondary | ICD-10-CM

## 2019-10-24 DIAGNOSIS — N183 Chronic kidney disease, stage 3 unspecified: Secondary | ICD-10-CM | POA: Diagnosis not present

## 2019-10-24 DIAGNOSIS — F339 Major depressive disorder, recurrent, unspecified: Secondary | ICD-10-CM | POA: Diagnosis not present

## 2019-10-24 DIAGNOSIS — I2510 Atherosclerotic heart disease of native coronary artery without angina pectoris: Secondary | ICD-10-CM

## 2019-10-24 NOTE — Patient Instructions (Addendum)
Licensed Clinical Social Worker Visit Information  Goals we discussed today:  .  Client has stress related to ongoing health issues and wishes to talk more about stress over current health issues (pt-stated)        Current Barriers:   Mental Health Concerns  in patient with Chronic Diagnoses of CAD, DM Type II, Depression,recurrent, CKD, COPD and CHF   Challenges related to managing health issues of client   Clinical Social Work Clinical Goal(s):   Over the next 30 days, client will work with LCSW to address concerns related to health issues of client and client management of health issues faced  Interventions:  Encouraged client to communicate with RNCM to discuss nursing needs of client  Talked with client about his management of health issues faced by client  Talked with client about sleeping issues of client  Talked with client previously about pain issues of client  Talked with client previously about portable oxygen system of client  Talked with Theodoros about his recent appointment with nephrologist  Talked with [name] about energy, fatigue of client  Talked with Makih about ambulation issues of client  Patient Self Care Activities:   Self administers medications as prescribed  Attends all scheduled provider appointments  Performs ADL's independently   Plan:  LCSW to call client in next 4 weeks to talk with client about stress issues related to his management of health issues faced Client to communicate with RNCM to discuss nursing needs of client Client to attend scheduled client medical appointments   Initial goal documentation    Follow Up Plan:LCSW to call client in next 4 weeks to talk with him about stress issues related to management of health issues faced  Materials Provided: No  The patient verbalized understanding of instructions provided today and declined a print copy of patient instruction materials.   [name].Rutha Melgoza MSW,  LCSW Licensed Clinical Social Worker [facility] Care Management [phone]

## 2019-10-24 NOTE — Chronic Care Management (AMB) (Signed)
Chronic Care Management    Clinical Social Work Follow Up Note  10/24/2019 Name: Kenneth Fuller MRN: [id number] DOB: May 12, 1945  Kenneth Fuller is a 74 y.o. year old male who is a primary care patient of [name], [name], MD. The CCM team was consulted for assistance with [name] .   Review of patient status, including review of consultants reports, other relevant assessments, and collaboration with appropriate care team members and the patient's provider was performed as part of comprehensive patient evaluation and provision of chronic care management services.    SDOH (Social Determinants of Health) assessments performed: No; risk for depression; risk for stress;risk for tobacco use;risk for physical inactivity    Office Visit from 05/28/2019 in [place]  PHQ-9 Total Score 10     GAD 7 : Generalized Anxiety Score 05/28/2019 11/11/2017  Nervous, Anxious, on Edge 0 3  Control/stop worrying 2 3  Worry too much - different things 2 3  Trouble relaxing 2 3  Restless 0 3  Easily annoyed or irritable 2 3  Afraid - awful might happen 0 0  Total GAD 7 Score 8 18  Anxiety Difficulty Somewhat difficult Somewhat difficult    Outpatient Encounter Medications as of 10/24/2019  Medication Sig Note  . albuterol (ACCUNEB) 1.25 MG/3ML nebulizer solution Take 1 ampule by nebulization every 6 (six) hours as needed for wheezing.   [name] albuterol (PROVENTIL HFA;VENTOLIN HFA) 108 (90 Base) MCG/ACT inhaler Inhale 2 puffs into the lungs every 6 (six) hours as needed for wheezing or shortness of breath.   [name] atorvastatin (LIPITOR) 40 MG tablet Take 1 tablet by mouth once daily   . bisoprolol (ZEBETA) 5 MG tablet Take 0.5 tablets (2.5 mg total) by mouth daily.   . Cholecalciferol (VITAMIN D PO) Take 5,000 Units by mouth every morning.    . colchicine 0.6 MG tablet Take 1 tablet (0.6 mg total) by mouth daily.   . dapagliflozin propanediol (FARXIGA) 5 MG TABS tablet  Take 5 mg by mouth daily.   . FEROSUL 325 (65 Fe) MG tablet Take 325 mg by mouth 3 (three) times daily.   . furosemide (LASIX) 40 MG tablet Take 1 tablet (40 mg total) by mouth daily.   . isosorbide dinitrate (ISORDIL) 10 MG tablet TAKE 1 TABLET BY MOUTH THREE TIMES DAILY   . mometasone-formoterol (DULERA) 100-5 MCG/ACT AERO Inhale 2 puffs into the lungs 2 (two) times daily. 05/17/2019: [employer] Patient assistance  . polyethylene glycol (MIRALAX / GLYCOLAX) 17 g packet Take 17 g by mouth daily. (Patient taking differently: Take 17 g by mouth daily as needed. )   . sertraline (ZOLOFT) 100 MG tablet Take 1 tablet (100 mg total) by mouth daily.   . tamsulosin (FLOMAX) 0.4 MG CAPS capsule Take 1 capsule by mouth once daily    No facility-administered encounter medications on file as of 10/24/2019.    Goals    .  Client has stress related to ongoing health issues and wishes to talk more about stress over current health issues (pt-stated)      Current Barriers:  [name] Mental Health Concerns  in patient with Chronic Diagnoses of CAD, DM Type II, Depression,recurrent, CKD, COPD and CHF .  Challenges related to managing health issues of client   Clinical Social Work Clinical Goal(s):  [name] Over the next 30 days, client will work with LCSW to address concerns related to health issues of client and client management of health issues faced  Interventions:  Encouraged client to communicate with RNCM to discuss nursing needs of client  Talked with client about his management of health issues faced by client  Talked with client about sleeping issues of client  Talked with client previously about pain issues of client  Talked with client previously about portable oxygen system of client  Talked with Kenneth Fuller about his recent appointment with nephrologist  Talked with Kenneth Fuller about energy, fatigue of client  Talked with Kenneth Fuller about ambulation issues of client  Patient Self Care Activities:  . Self administers  medications as prescribed . Attends all scheduled provider appointments . Performs ADL's independently   Plan:  LCSW to call client in next 4 weeks to talk with client about stress issues related to his management of health issues faced Client to communicate with RNCM to discuss nursing needs of client Client to attend scheduled client medical appointments   Initial goal documentation    Follow Up Plan: LCSW to call client in next 4 weeks to talk with him about stress issues related to management of health issues faced  Kenneth Fuller MSW, LCSW Licensed Clinical Social Worker [facility] [phone]

## 2019-11-01 ENCOUNTER — Emergency Department (HOSPITAL_COMMUNITY)
Admission: EM | Admit: 2019-11-01 | Discharge: 2019-11-02 | Disposition: A | Discharge status: Home / Self Care | Payer: PPO | Attending: Emergency Medicine | Admitting: Emergency Medicine

## 2019-11-01 ENCOUNTER — Emergency Department (HOSPITAL_COMMUNITY): Payer: PPO

## 2019-11-01 ENCOUNTER — Encounter (HOSPITAL_COMMUNITY): Payer: Self-pay

## 2019-11-01 ENCOUNTER — Other Ambulatory Visit: Payer: Self-pay

## 2019-11-01 DIAGNOSIS — E1140 Type 2 diabetes mellitus with diabetic neuropathy, unspecified: Secondary | ICD-10-CM | POA: Diagnosis not present

## 2019-11-01 DIAGNOSIS — Z7984 Long term (current) use of oral hypoglycemic drugs: Secondary | ICD-10-CM | POA: Diagnosis not present

## 2019-11-01 DIAGNOSIS — J441 Chronic obstructive pulmonary disease with (acute) exacerbation: Secondary | ICD-10-CM | POA: Diagnosis not present

## 2019-11-01 DIAGNOSIS — Z79899 Other long term (current) drug therapy: Secondary | ICD-10-CM | POA: Insufficient documentation

## 2019-11-01 DIAGNOSIS — N183 Chronic kidney disease, stage 3 unspecified: Secondary | ICD-10-CM | POA: Diagnosis not present

## 2019-11-01 DIAGNOSIS — Z20822 Contact with and (suspected) exposure to covid-19: Secondary | ICD-10-CM | POA: Diagnosis not present

## 2019-11-01 DIAGNOSIS — Z87891 Personal history of nicotine dependence: Secondary | ICD-10-CM | POA: Diagnosis not present

## 2019-11-01 DIAGNOSIS — I130 Hypertensive heart and chronic kidney disease with heart failure and stage 1 through stage 4 chronic kidney disease, or unspecified chronic kidney disease: Secondary | ICD-10-CM | POA: Diagnosis not present

## 2019-11-01 DIAGNOSIS — R079 Chest pain, unspecified: Secondary | ICD-10-CM | POA: Diagnosis present

## 2019-11-01 DIAGNOSIS — Z955 Presence of coronary angioplasty implant and graft: Secondary | ICD-10-CM | POA: Insufficient documentation

## 2019-11-01 DIAGNOSIS — J449 Chronic obstructive pulmonary disease, unspecified: Secondary | ICD-10-CM | POA: Diagnosis not present

## 2019-11-01 DIAGNOSIS — I5032 Chronic diastolic (congestive) heart failure: Secondary | ICD-10-CM | POA: Insufficient documentation

## 2019-11-01 DIAGNOSIS — I2510 Atherosclerotic heart disease of native coronary artery without angina pectoris: Secondary | ICD-10-CM | POA: Insufficient documentation

## 2019-11-01 DIAGNOSIS — I10 Essential (primary) hypertension: Secondary | ICD-10-CM | POA: Diagnosis not present

## 2019-11-01 LAB — BASIC METABOLIC PANEL
Anion gap: 10 (ref 5–15)
BUN: 19 mg/dL (ref 8–23)
CO2: 26 mmol/L (ref 22–32)
Calcium: 8.7 mg/dL — ABNORMAL LOW (ref 8.9–10.3)
Chloride: 99 mmol/L (ref 98–111)
Creatinine, Ser: 1.09 mg/dL (ref 0.61–1.24)
GFR calc Af Amer: >60 mL/min (ref >60)
GFR calc non Af Amer: >60 mL/min (ref >60)
Glucose, Bld: 104 mg/dL — ABNORMAL HIGH (ref 70–99)
Potassium: 4.2 mmol/L (ref 3.5–5.1)
Sodium: 135 mmol/L (ref 135–145)

## 2019-11-01 LAB — CBC
HCT: 50 % (ref 39.0–52.0)
Hemoglobin: 15.5 g/dL (ref 13.0–17.0)
MCH: 26.6 pg (ref 26.0–34.0)
MCHC: 31 g/dL (ref 30.0–36.0)
MCV: 85.8 fL (ref 80.0–100.0)
Platelets: 250 10*3/uL (ref 150–400)
RBC: 5.83 MIL/uL — ABNORMAL HIGH (ref 4.22–5.81)
RDW: 15.7 % — ABNORMAL HIGH (ref 11.5–15.5)
WBC: 10.2 10*3/uL (ref 4.0–10.5)
nRBC: 0 % (ref 0.0–0.2)

## 2019-11-01 LAB — TROPONIN I (HIGH SENSITIVITY)
Troponin I (High Sensitivity): 6 ng/L (ref <18)
Troponin I (High Sensitivity): 5 ng/L (ref <18)

## 2019-11-01 MED ORDER — ALBUTEROL SULFATE HFA 108 (90 BASE) MCG/ACT IN AERS
4.0000 | INHALATION_SPRAY | Freq: Once | RESPIRATORY_TRACT | Status: AC
  Administered 2019-11-02: 4 via RESPIRATORY_TRACT
  Filled 2019-11-01: qty 6.7

## 2019-11-01 MED ORDER — AEROCHAMBER Z-STAT PLUS/MEDIUM MISC: 1.0000 | Freq: Once | Status: DC

## 2019-11-01 NOTE — ED Triage Notes (Addendum)
Ambulatory from home Chest Tightness + COPD flair x3 days.  Denies Pain

## 2019-11-01 NOTE — ED Provider Notes (Signed)
[name] EMERGENCY DEPARTMENT Provider Note   CSN: [id number] Arrival date & time: 11/01/19  1921   Time seen 11:22 AM  History Chief Complaint  Patient presents with  . Chest Pain    Kenneth Fuller is a 74 y.o. male.  HPI   Patient states he has been in [place] for the last week visiting his brother who is in the hospital.  He states his brother died from kidney failure this morning.  Patient drove 3-1/2 hours home today.  He states 2 to 3 days ago he started having a cough with white mucus production but no fever.  He denies rhinorrhea but has had sore throat but no loss of taste or smell.  He denies nausea, vomiting, diarrhea, or chest pain.  He states he is chest feels tight and he cannot take a big deep breath.  He feels short of breath.  He states he has had some wheezing and the inhalers helped "some".  He denies any swelling of his lower extremities.  He states he has felt this way before when he had congestive heart failure.  Patient does not smoke for many years.  He states he had Covid in March but was not hospitalized.  He has not had the Covid vaccine.  He states his wife has cirrhosis and he wanted to be evaluated before he went home and exposed her to any other illness.  PCP [name], [name], MD   Past Medical History:  Diagnosis Date  . Allergy   . Asthma   . CAD (coronary artery disease)   . CHF (congestive heart failure) ([place])   . Diabetes mellitus   . Fibromyalgia   . GERD (gastroesophageal reflux disease)   . GI bleeding   . Gout   . Hyperlipidemia   . Hypertension   . Hypogonadism male   . Insomnia   . MRSA cellulitis   . Neuropathy   . Obesity   . Shortness of breath dyspnea    with exertion   . Sleep apnea    cpap- 14   . Wheezing    no asthma diagnosis    Patient Active Problem List   Diagnosis Date Noted  . AKI (acute kidney injury) ([place]) 06/05/2019  . Hyperkalemia 06/05/2019  . Acute renal failure (ARF) ([place]) 06/04/2019  .  Dizziness 06/04/2019  . Near syncope 06/04/2019  . Insomnia   . CHF (congestive heart failure) ([place]) 01/06/2018  . Depression with anxiety 01/04/2018  . CKD (chronic kidney disease), stage III 01/04/2018  . Chronic obstructive pulmonary disease ([place]) 01/04/2018  . Depression, recurrent ([place]) 11/11/2017  . Chronic diastolic heart failure ([place]) 04/13/2017  . Swelling of lower extremity 11/30/2016  . Obesity, Class III, BMI 40-49.9 (morbid obesity) ([place]) 08/21/2015  . Coronary artery disease involving native coronary artery of native heart without angina pectoris 12/05/2014  . Urinary urgency 10/15/2014  . Spinal stenosis, lumbar region, with neurogenic claudication 01/02/2014  . Hyperlipidemia associated with type 2 diabetes mellitus ([place]) 10/25/2013  . COLONIC POLYPS, ADENOMATOUS 02/25/2007  . Diabetes mellitus type II, non insulin dependent ([place]) 02/25/2007  . Gout 02/25/2007  . Essential hypertension 02/25/2007  . Cough variant asthma 02/25/2007  . OSA on CPAP 02/25/2007  . FATIGUE, CHRONIC 02/25/2007  . HEADACHE, CHRONIC 02/25/2007  . GERD (gastroesophageal reflux disease) 02/25/2007    Past Surgical History:  Procedure Laterality Date  . BACK SURGERY    . CARDIAC CATHETERIZATION    . COLONOSCOPY    .  LEFT HEART CATH AND CORONARY ANGIOGRAPHY N/A 12/02/2016   Procedure: LEFT HEART CATH AND CORONARY ANGIOGRAPHY;  Surgeon: Jettie Booze, MD;  Location: [facility];  Service: Cardiovascular;  Laterality: N/A;  . LUMBAR LAMINECTOMY/DECOMPRESSION MICRODISCECTOMY N/A 01/02/2014   Procedure: CENTRAL DECOMPRESSION LUMBAR LAMINECTOMY L3-L4, L4-L5;  Surgeon: Tobi Bastos, MD;  Location: [facility];  Service: Orthopedics;  Laterality: N/A;  . neck fusion         Family History  Problem Relation Age of Onset  . Colon cancer Mother   . Diabetes Father        siblings  . Heart disease Father        brother  . Heart attack Father   . Kidney disease Sister   . Heart  failure Sister   . Heart disease Brother   . Heart attack Son 43  . Drug abuse Sister   . Heart disease Brother   . Deep vein thrombosis Brother   . Colon polyps Neg Hx     Social History   Tobacco Use  . Smoking status: Former Smoker    Quit date: 02/08/1969    Years since quitting: 50.7  . Smokeless tobacco: Former Systems developer    Quit date: Pharmacologist  . Vaping Use: Never used  Substance Use Topics  . Alcohol use: No    Alcohol/week: 0.0 standard drinks  . Drug use: No  lives at home Lives with spouse  Home Medications Prior to Admission medications   Medication Sig Start Date End Date Taking? Authorizing Provider  albuterol (ACCUNEB) 1.25 MG/3ML nebulizer solution Take 1 ampule by nebulization every 6 (six) hours as needed for wheezing.    [provider]  albuterol (PROVENTIL HFA;VENTOLIN HFA) 108 (90 Base) MCG/ACT inhaler Inhale 2 puffs into the lungs every 6 (six) hours as needed for wheezing or shortness of breath. 07/20/17   Brand Males, MD  atorvastatin (LIPITOR) 40 MG tablet Take 1 tablet by mouth once daily 05/14/19   [name], [name], MD  bisoprolol (ZEBETA) 5 MG tablet Take 0.5 tablets (2.5 mg total) by mouth daily. 10/05/18   [name], [name], MD  Cholecalciferol (VITAMIN D PO) Take 5,000 Units by mouth every morning.     [provider]  colchicine 0.6 MG tablet Take 1 tablet (0.6 mg total) by mouth daily. 09/21/19   [name], [name], MD  dapagliflozin propanediol (FARXIGA) 5 MG TABS tablet Take 5 mg by mouth daily.    [provider]  FEROSUL 325 (65 Fe) MG tablet Take 325 mg by mouth 3 (three) times daily. 08/15/19   [provider]  furosemide (LASIX) 40 MG tablet Take 1 tablet (40 mg total) by mouth daily. 06/05/19   Roxan Hockey, MD  isosorbide dinitrate (ISORDIL) 10 MG tablet TAKE 1 TABLET BY MOUTH THREE TIMES DAILY 09/10/19   [name], [name], MD  mometasone-formoterol (DULERA) 100-5 MCG/ACT AERO Inhale 2 puffs  into the lungs 2 (two) times daily.    [provider]  polyethylene glycol (MIRALAX / GLYCOLAX) 17 g packet Take 17 g by mouth daily. Patient taking differently: Take 17 g by mouth daily as needed.  06/07/19   Roxan Hockey, MD  sertraline (ZOLOFT) 100 MG tablet Take 1 tablet (100 mg total) by mouth daily. 05/28/19   [name], [name], MD  tamsulosin (FLOMAX) 0.4 MG CAPS capsule Take 1 capsule by mouth once daily 10/11/19   [name], [name], MD    Allergies  Amlodipine besy-benazepril hcl, Oxycodone, Phenergan [promethazine hcl], and Hydrocodone  Review of Systems   Review of Systems  All other systems reviewed and are negative.   Physical Exam Updated Vital Signs BP (!) 173/86 (BP Location: Left Arm)   Pulse 66   Temp 98 F (36.7 C) (Oral)   Resp 19   Ht _0  (1.702 m)   Wt 114.8 kg   SpO2 98%   BMI 39.63 kg/m   Physical Exam Vitals and nursing note reviewed.  Constitutional:      Appearance: Normal appearance. He is obese.  HENT:     Head: Normocephalic and atraumatic.     Right Ear: External ear normal.     Left Ear: External ear normal.     Nose: Nose normal.  Eyes:     Extraocular Movements: Extraocular movements intact.     Conjunctiva/sclera: Conjunctivae normal.  Cardiovascular:     Rate and Rhythm: Normal rate and regular rhythm.     Pulses: Normal pulses.     Heart sounds: Normal heart sounds. No murmur heard.   Pulmonary:     Effort: Pulmonary effort is normal.     Breath sounds: Normal breath sounds. No wheezing, rhonchi or rales.     Comments: Coughs when he breathes deeply Abdominal:     General: There is distension.     Palpations: Abdomen is soft.  Musculoskeletal:     Cervical back: Normal range of motion.     Right lower leg: No edema.     Left lower leg: No edema.     Comments: No calf tenderness  Skin:    General: Skin is warm and dry.  Neurological:     General: No focal deficit present.     Mental Status: He is alert  and oriented to person, place, and time.     Cranial Nerves: No cranial nerve deficit.     Sensory: Sensory deficit:   Psychiatric:        Mood and Affect: Mood normal.        Behavior: Behavior normal.        Thought Content: Thought content normal.     ED Results / Procedures / Treatments   Labs (all labs ordered are listed, but only abnormal results are displayed) Results for orders placed or performed during the hospital encounter of 11/01/19  Respiratory Panel by RT PCR (Flu A&B, Covid) - Nasopharyngeal Swab   Specimen: Nasopharyngeal Swab  Result Value Ref Range   SARS Coronavirus 2 by RT PCR NEGATIVE NEGATIVE   Influenza A by PCR NEGATIVE NEGATIVE   Influenza B by PCR NEGATIVE NEGATIVE  Group A Strep by PCR   Specimen: Throat; Sterile Swab  Result Value Ref Range   Group A Strep by PCR NOT DETECTED NOT DETECTED  Basic metabolic panel  Result Value Ref Range   Sodium 135 135 - 145 mmol/L   Potassium 4.2 3.5 - 5.1 mmol/L   Chloride 99 98 - 111 mmol/L   CO2 26 22 - 32 mmol/L   Glucose, Bld 104 (H) 70 - 99 mg/dL   BUN 19 8 - 23 mg/dL   Creatinine, Ser 1.09 0.61 - 1.24 mg/dL   Calcium 8.7 (L) 8.9 - 10.3 mg/dL   GFR calc non Af Amer >60 >60 mL/min   GFR calc Af Amer >60 >60 mL/min   Anion gap 10 5 - 15  CBC  Result Value Ref Range   WBC 10.2 4.0 - 10.5 K/uL  RBC 5.83 (H) 4.22 - 5.81 MIL/uL   Hemoglobin 15.5 13.0 - 17.0 g/dL   HCT 50.0 39 - 52 %   MCV 85.8 80.0 - 100.0 fL   MCH 26.6 26.0 - 34.0 pg   MCHC 31.0 30.0 - 36.0 g/dL   RDW 15.7 (H) 11.5 - 15.5 %   Platelets 250 150 - 400 K/uL   nRBC 0.0 0.0 - 0.2 %  Troponin I (High Sensitivity)  Result Value Ref Range   Troponin I (High Sensitivity) 6 <18 ng/L  Troponin I (High Sensitivity)  Result Value Ref Range   Troponin I (High Sensitivity) 5 <18 ng/L   Laboratory interpretation all normal     EKG EKG Interpretation  Date/Time:  Thursday November 01 2019 19:30:51 EDT Ventricular Rate:  67 PR  Interval:  162 QRS Duration: 92 QT Interval:  432 QTC Calculation: 456 R Axis:   84 Text Interpretation: Normal sinus rhythm Normal ECG No significant change since last tracing 03 Jun 2019 Confirmed by Rolland Porter [phone]) on 11/01/2019 11:06:28 PM   Radiology DG Chest 2 View  Result Date: 11/01/2019 CLINICAL DATA:  Chest tightness, COPD exacerbation EXAM: CHEST - 2 VIEW COMPARISON:  06/03/2019 FINDINGS: Frontal and lateral views of the chest demonstrate a stable cardiac silhouette. No airspace disease, effusion, or pneumothorax. No acute bony abnormalities. IMPRESSION: 1. Stable exam, no acute process. Electronically Signed   By: Randa Ngo M.D.   On: 11/01/2019 19:59    Procedures Procedures (including critical care time)  Medications Ordered in ED Medications  aerochamber Z-Stat Plus/medium 1 each (has no administration in time range)  albuterol (VENTOLIN HFA) 108 (90 Base) MCG/ACT inhaler 4 puff (4 puffs Inhalation Given 11/02/19 0010)    ED Course  I have reviewed the triage vital signs and the nursing notes.  Pertinent labs & imaging results that were available during my care of the patient were reviewed by me and considered in my medical decision making (see chart for details).    MDM Rules/Calculators/A&P                          Patient does not appear to be in any respiratory distress.  He is actually sitting in a chair watching TV.  Patient has symptoms that can be concerning for Covid.  He states he did have Covid in March.  Covid testing was done.  His initial laboratory test are without acute findings.  He was given albuterol 4 puffs by inhaler.  Recheck at time of discharge.  Patient states the inhaler helped.  His labs are all normal.  He may be having a mild flareup of his COPD.  He does not smoke.  At this point I do not feel like he needs antibiotics or steroids.  He can just use his inhaler more often.  Final Clinical Impression(s) / ED Diagnoses Final  diagnoses:  COPD exacerbation ([place])    Rx / DC Orders ED Discharge Orders    None      Plan discharge  Rolland Porter, MD, Barbette Or, MD 11/02/19 [phone]

## 2019-11-02 LAB — RESPIRATORY PANEL BY RT PCR (FLU A&B, COVID)
Influenza A by PCR: NEGATIVE
Influenza B by PCR: NEGATIVE
SARS Coronavirus 2 by RT PCR: NEGATIVE

## 2019-11-02 LAB — GROUP A STREP BY PCR: Group A Strep by PCR: NOT DETECTED

## 2019-11-02 NOTE — Discharge Instructions (Signed)
Use the inhaler with the spacer 2 puffs every 4-6 hours as needed for shortness of breath or cough.  You can take Mucinex DM over-the-counter for cough.  Recheck if you get fever, struggle to breathe, or if you are improving in the next week to 10 days.  Let Dr. Warrick Parisian know, at that point he may put you on antibiotics.

## 2019-11-05 NOTE — Chronic Care Management (AMB) (Signed)
  Chronic Care Management   Outreach Note  12/20/2018 Name: Kenneth Fuller MRN: [id number] DOB: 1945-10-23  Referred by: [name], [name], MD Reason for referral : Chronic Care Management (RN Follow up)   An unsuccessful telephone follow-up was attempted today. The patient was referred to the case management team for assistance with care management and care coordination.   Follow Up Plan: A HIPAA compliant phone message was left for the patient providing contact information and requesting a return call.  The care management team will reach out to the patient again over the next 30 days.   [name], BSN, RN-BC Embedded Chronic Care Manager [facility] / [place] [facility] Direct Dial: [phone]

## 2019-11-07 NOTE — Progress Notes (Signed)
  This encounter was created in error - please disregard.

## 2019-11-07 NOTE — Patient Instructions (Signed)
[name], BSN, RN-BC Embedded Chronic Care Manager [facility] / [place] [facility] Direct Dial: [phone]

## 2019-11-07 NOTE — Chronic Care Management (AMB) (Signed)
  Chronic Care Management   Note  12/29/2018 Name: Kenneth Fuller MRN: [id number] DOB: 09-15-1945  Quick follow-up on patient to follow-up on ED visit from yesterday. Diagnosed with Covid less than one month ago. Presented to ED with dyspnea on exertion. Stable at discharge. No increased complaints today. PCP follow-up visit scheduled. Encouraged to reach out to PCP with any new or worsening symptoms or seek emergency medical attention as needed.   Follow up plan: The care management team will reach out to the patient again over the next 30 days.   [name], BSN, RN-BC Embedded Chronic Care Manager [facility] / [place] [facility] Direct Dial: [phone]

## 2019-11-09 ENCOUNTER — Other Ambulatory Visit: Payer: Self-pay | Admitting: Family Medicine

## 2019-11-15 ENCOUNTER — Telehealth: Payer: Self-pay | Admitting: *Deleted

## 2019-11-15 ENCOUNTER — Ambulatory Visit (INDEPENDENT_AMBULATORY_CARE_PROVIDER_SITE_OTHER): Payer: PPO | Admitting: *Deleted

## 2019-11-15 DIAGNOSIS — E119 Type 2 diabetes mellitus without complications: Secondary | ICD-10-CM

## 2019-11-15 DIAGNOSIS — I10 Essential (primary) hypertension: Secondary | ICD-10-CM

## 2019-11-15 DIAGNOSIS — N183 Chronic kidney disease, stage 3 unspecified: Secondary | ICD-10-CM

## 2019-11-15 DIAGNOSIS — J441 Chronic obstructive pulmonary disease with (acute) exacerbation: Secondary | ICD-10-CM

## 2019-11-15 NOTE — Patient Instructions (Signed)
Visit Information  Goals Addressed              This Visit's Progress     Patient Stated   .  "I need to keep this anemia under control" (pt-stated)   On track     Current Barriers:  . Chronic Disease Management support and education needs related to anemia  in patient with CKD  Nurse Case Manager Clinical Goal(s):  [name] Over the next 60 days, patient will continue to work with nephrologist regarding anemia and potential need for GI follow-up  Interventions:  . Reivewed upcoming appointments: Dr Ardis Hughs on 11/28/19 . Reviewd and discussed medications: Ferosul 343m . Previously reviewed chart including office notes, colonoscopy report, and pathology report o Colonoscopy for melena in 2017 with Dr JArdis Hughso 3 abnormal polyps removed. No dysplasia or malignancy.  o Advised to repeat in 5 years . Encouraged patient to reach out to GI with any new or worsening symptoms . [facility]  Patient Self Care Activities:  . Performs ADL's independently . Performs IADL's independently  Please see past updates related to this goal by clicking on the "Past Updates" button in the selected goal      .  COMPLETED: "I need to understand this EOB that I got" (pt-stated)        CARE PLAN ENTRY (see longitudinal plan of care for additional care plan information)  Current Barriers:  .Marland KitchenKnowledge Deficits related to EOB from HTA for CCM services  Nurse Case Manager Clinical Goal(s):  .Marland KitchenOver the next 30 days, patient will talk with RN Care Manager regarding EOB  Interventions:  . Inter-disciplinary care team collaboration (see longitudinal plan of care) . Discussed EOB . Forwarded copy of EOB to CCM management who are looking into the billing component . Advised Mr RBartelthat this is a billing error and that he shouldn't receive a bill. A . Advised him to let me know if he does receive a bill.   Patient Self Care Activities:  . Performs ADL's  independently . Performs IADL's independently  Initial goal documentation     .  "I want to keep my blood pressure under control" (pt-stated)   Not on track     Current Barriers:  .Marland KitchenKnowledge Deficits related to acute on chronic elevation of blood pressure . Chronic Disease Management support and education needs related to Hypertension  Nurse Case Manager Clinical Goal(s):  .Marland KitchenOver the next 60 days, patient will talk with RN CCM regarding blood pressure management . Over the next 60 days, patient will take all medications as prescribed . Over the next 15 days, patient will have an appointment with PCP to discuss blood pressure management   Interventions:  . Talked with patient by telephone today . Encouraged patient to check and record blood pressure and pulse twice daily . Chart reviewed including recent office notes and lab results . Discussed home readings o Readings have worsened o Yesterday 148/90, today 168/99 with pulse of 66 . Reviewed and discussed medications o taking bisoprolol 2.5 mg daily. Not taking furosemide o No recent prednisone or anything that may have increased BP . Denies any chest pain or fluid retention  . Telephone message sent to PCP advising of BP elevation. Would like for him to review meds and possibly recommend med change  . Also needs appt with PCP. Requested that clinical staff reach out to schedule appt with PCP . Encouraged patient  to reach out to PCP with any readings outside of recommended range . Encouraged patient ot reach out to [facility] Manager as needed  Patient Self Care Activities:  . Performs ADL's independently . Performs IADL's independently  Please see past updates related to this goal by clicking on the "Past Updates" button in the selected goal      .  "I would like to keep my blood sugar under control" (pt-stated)   On track      Current Barriers:  [name] Knowledge deficits related to DM management . Financial  constraints . CKD  Nurse Case Manager Clinical Goal(s):  [name] Over the next 90 days, patient will continue to work with [facility] clinical pharmacist regarding diabetes management . Over the next 60 days, patient will talk with the RN Care Manager regarding diabetes management  Interventions:  . Chart reviewed, including recent office notes, PharmD notes, and lab reports . Talked with patient by telephone . Reviewed and discussed medications: Farxiga 31m daily . Discussed home blood sugar readings o Checking blood sugar twice daily o No lows o Blood sugar today 162 after drinking a slim fast . Discussed diet and activity level o Patient has lost 30 lbs by changing diet  . Encouraged patient to continue checking blood sugar as directed and to report any readings outside of recommended range . Previously provided with RN Care Manager contact information and encouraged to reach out as needed  Patient Self Care Activities:  . Performs ADL's independently . Performs IADL's independently  Please see past updates related to this goal by clicking on the "Past Updates" button in the selected goal      .  COMPLETED: "I would like to sleep better at night" (pt-stated)        Current Barriers:  .Marland KitchenKnowledge Deficits related to sleep hygiene.  Nurse Case Manager Clinical Goal(s):  .Marland KitchenOver the next 30 days, patient will demonstrate continued improvement in self management of insomnia by reporting that he sleeps well most nights  Interventions:  . Reviewed medications and discussed Ambien o Requesting refill. Thought it was to be refilled at his last PCP appt.  o Questioned patient regarding side effects and he denies any o Questioned effectiveness and patient reported that he is sleeping well with the ambien most nights. This is much improved from previous reports. .Marland KitchenRNCM will send Rx refill request to PCP. [facility] [place]  . Advised to report any unexpected side  effects to our office by calling [phone] Patient Self Care Activities:  . Performs ADL's independently . Performs IADL's independently  Please see past updates related to this goal by clicking on the "Past Updates" button in the selected goal       .  COPD Symptom Management (pt-stated)        Current Barriers:  . Chronic Disease Management support and education needs related to COPD & shortness of breath  Nurse Case Manager Clinical Goal(s):  .Marland KitchenOver the next 90 days, patient will continue to demonstrate self management of COPD as evidenced by no acute exacerbations   Interventions:  . Discussed HPI with patient . Chart reviewed . Discussed recent ED visit and treatment . Discussed that acute exacerbation symptoms have resolved . Discussed mobility and activity level . Reviewed and discussed medications . Advised patient to reach out to PCP or RN CCM with any COPD needs  Patient Self Care Activities:  . Performs ADL's independently .  Performs IADL's independently . Unable to independently N/A  Please see past updates related to this goal by clicking on the "Past Updates" button in the selected goal        Other   .  "I need to talk with someone about incontinence"        CARE PLAN ENTRY (see longitudinal plan of care for additional care plan information)  Current Barriers:  . Care Coordination needs related to new onset urge incontinence in a patient with CKD  Nurse Case Manager Clinical Goal(s):  [name] Over the next 15 days, patient will have an appointment to discuss urge incontinence with PCP  Interventions:  . Inter-disciplinary care team collaboration (see longitudinal plan of care) . Talked with patient by telephone . Discussed HPI o Developed urge incontinence about 2 weeks ago while driving home from [place]. Had two episodes while driving and has frequent uncontrollable episodes since then  o Denies frequency, urinary odor, or dysuria . Reviewed  and discussed medications o Not taking furosemide o Taking flomax . Message sent to PCP and requested that clinical staff schedule an appointment for evaluation  . Encouraged patient to reach out to PCP with any new or worsening symptoms . Encouraged patient to reach out to [facility] Manager as needed  Patient Self Care Activities:  . Performs ADL's independently . Performs IADL's independently  Initial goal documentation     .  COMPLETED: "Kidney Pain"        CARE PLAN ENTRY (see longtitudinal plan of care for additional care plan information)  Right sided back/flank pain in patient with CKD and history of kidney stones  Current Barriers:  [name] Knowledge Deficits related to cause of back/flank pain  Nurse Case Manager Clinical Goal(s):  [name] Over the next 3 days, patient will see provider at PCP office regarding back/flank pain  Interventions:  . Chart reviewed including recent office notes, imaging reports, and lab results . Talked with patient by telephone o Continues to have some flank/back pain but it has improved slightly o Has noticed that pain is worse with increased activity o Reviewed provider treatment recommendations . Encourage patient to reach out to PCP or RN CM with new or worsening symptoms  Patient Self Care Activities:  . Performs ADL's independently . Performs IADL's independently  Please see past updates related to this goal by clicking on the "Past Updates" button in the selected goal         Patient verbalizes understanding of instructions provided today.   Follow-up Plan Patient will f/u with PCP over the next 15 days The care management team will reach out to the patient again over the next 30 days.    [name], BSN, RN-BC Embedded Chronic Care Manager [facility] [place] [facility] Direct Dial: [phone]

## 2019-11-15 NOTE — Chronic Care Management (AMB) (Signed)
Chronic Care Management   Follow Up Note   11/15/2019 Name: Kenneth Fuller MRN: [id number] DOB: 29-Mar-1945  Referred by: [name], [name], MD Reason for referral : No chief complaint on file.   Kenneth Fuller is a 74 y.o. year old male who is a primary care patient of [name], [name], MD. The CCM team was consulted for assistance with chronic disease management and care coordination needs.    Review of patient status, including review of consultants reports, relevant laboratory and other test results, and collaboration with appropriate care team members and the patient's provider was performed as part of comprehensive patient evaluation and provision of chronic care management services.    SDOH (Social Determinants of Health) assessments performed: No See Care Plan activities for detailed interventions related to [facility])     Outpatient Encounter Medications as of 11/15/2019  Medication Sig Note  . albuterol (ACCUNEB) 1.25 MG/3ML nebulizer solution Take 1 ampule by nebulization every 6 (six) hours as needed for wheezing.   [name] albuterol (PROVENTIL HFA;VENTOLIN HFA) 108 (90 Base) MCG/ACT inhaler Inhale 2 puffs into the lungs every 6 (six) hours as needed for wheezing or shortness of breath.   [name] atorvastatin (LIPITOR) 40 MG tablet Take 1 tablet by mouth once daily   . bisoprolol (ZEBETA) 5 MG tablet Take 0.5 tablets (2.5 mg total) by mouth daily.   . Cholecalciferol (VITAMIN D PO) Take 5,000 Units by mouth every morning.    . dapagliflozin propanediol (FARXIGA) 5 MG TABS tablet Take 5 mg by mouth daily. 11/15/2019: Patient taking 2.5 mg (1/2 tablet) daily  . FEROSUL 325 (65 Fe) MG tablet Take 325 mg by mouth 3 (three) times daily.    . isosorbide dinitrate (ISORDIL) 10 MG tablet TAKE 1 TABLET BY MOUTH THREE TIMES DAILY   . mometasone-formoterol (DULERA) 100-5 MCG/ACT AERO Inhale 2 puffs into the lungs 2 (two) times daily. 05/17/2019: [employer] Patient assistance  . sertraline (ZOLOFT)  100 MG tablet Take 1 tablet (100 mg total) by mouth daily.   . tamsulosin (FLOMAX) 0.4 MG CAPS capsule Take 1 capsule by mouth once daily   . colchicine 0.6 MG tablet Take 1 tablet (0.6 mg total) by mouth daily. (Patient not taking: Reported on 11/15/2019) 11/15/2019: Takes as needed  . furosemide (LASIX) 40 MG tablet Take 1 tablet (40 mg total) by mouth daily. (Patient not taking: Reported on 11/15/2019)   . polyethylene glycol (MIRALAX / GLYCOLAX) 17 g packet Take 17 g by mouth daily. (Patient taking differently: Take 17 g by mouth daily as needed. )    No facility-administered encounter medications on file as of 11/15/2019.     Goals Addressed              This Visit's Progress     Patient Stated   .  "I need to keep this anemia under control" (pt-stated)   On track     Current Barriers:  . Chronic Disease Management support and education needs related to anemia  in patient with CKD  Nurse Case Manager Clinical Goal(s):  [name] Over the next 60 days, patient will continue to work with nephrologist regarding anemia and potential need for GI follow-up  Interventions:  . Reivewed upcoming appointments: Dr Ardis Hughs on 11/28/19 . Reviewd and discussed medications: Ferosul 323m . Previously reviewed chart including office notes, colonoscopy report, and pathology report o Colonoscopy for melena in 2017 with Dr JArdis Hughso 3 abnormal polyps removed. No dysplasia or malignancy.  o Advised  to repeat in 5 years . Encouraged patient to reach out to GI with any new or worsening symptoms . Encouraged patient to reach out to [facility] team as needed  Patient Self Care Activities:  . Performs ADL's independently . Performs IADL's independently  Please see past updates related to this goal by clicking on the "Past Updates" button in the selected goal      .  "I want to keep my blood pressure under control" (pt-stated)   Not on track     Current Barriers:  [name] Knowledge Deficits related to acute on chronic  elevation of blood pressure . Chronic Disease Management support and education needs related to Hypertension  Nurse Case Manager Clinical Goal(s):  [name] Over the next 60 days, patient will talk with RN CCM regarding blood pressure management . Over the next 60 days, patient will take all medications as prescribed . Over the next 15 days, patient will have an appointment with PCP to discuss blood pressure management   Interventions:  . Talked with patient by telephone today . Encouraged patient to check and record blood pressure and pulse twice daily . Chart reviewed including recent office notes and lab results . Discussed home readings o Readings have worsened o Yesterday 148/90, today 168/99 with pulse of 66 . Reviewed and discussed medications o taking bisoprolol 2.5 mg daily. Not taking furosemide o No recent prednisone or anything that may have increased BP . Denies any chest pain or fluid retention  . Telephone message sent to PCP advising of BP elevation. Would like for him to review meds and possibly recommend med change  . Also needs appt with PCP. Requested that clinical staff reach out to schedule appt with PCP . Encouraged patient to reach out to PCP with any readings outside of recommended range . Encouraged patient ot reach out to [facility] Manager as needed  Patient Self Care Activities:  . Performs ADL's independently . Performs IADL's independently  Please see past updates related to this goal by clicking on the "Past Updates" button in the selected goal      .  "I would like to keep my blood sugar under control" (pt-stated)   On track      Current Barriers:  [name] Knowledge deficits related to DM management . Financial constraints . CKD  Nurse Case Manager Clinical Goal(s):  [name] Over the next 90 days, patient will continue to work with [facility] clinical pharmacist regarding diabetes management . Over the next 60 days, patient will talk with the RN Care Manager regarding  diabetes management  Interventions:  . Chart reviewed, including recent office notes, PharmD notes, and lab reports . Talked with patient by telephone . Reviewed and discussed medications: Farxiga 18m daily . Discussed home blood sugar readings o Checking blood sugar twice daily o No lows o Blood sugar today 162 after drinking a slim fast . Discussed diet and activity level o Patient has lost 30 lbs by changing diet  . Encouraged patient to continue checking blood sugar as directed and to report any readings outside of recommended range . Previously provided with RN Care Manager contact information and encouraged to reach out as needed  Patient Self Care Activities:  . Performs ADL's independently . Performs IADL's independently  Please see past updates related to this goal by clicking on the "Past Updates" button in the selected goal      .  COPD Symptom Management (pt-stated)        Current  Barriers:  . Chronic Disease Management support and education needs related to COPD & shortness of breath  Nurse Case Manager Clinical Goal(s):  [name] Over the next 90 days, patient will continue to demonstrate self management of COPD as evidenced by no acute exacerbations   Interventions:  . Discussed HPI with patient . Chart reviewed . Discussed recent ED visit and treatment . Discussed that acute exacerbation symptoms have resolved . Discussed mobility and activity level . Reviewed and discussed medications . Advised patient to reach out to PCP or RN CCM with any COPD needs  Patient Self Care Activities:  . Performs ADL's independently . Performs IADL's independently . Unable to independently N/A  Please see past updates related to this goal by clicking on the "Past Updates" button in the selected goal      .  "I need to talk with someone about incontinence"        [place] (see longitudinal plan of care for additional care plan information)  Current Barriers:  . Care  Coordination needs related to new onset urge incontinence in a patient with CKD  Nurse Case Manager Clinical Goal(s):  [name] Over the next 15 days, patient will have an appointment to discuss urge incontinence with PCP  Interventions:  . Inter-disciplinary care team collaboration (see longitudinal plan of care) . Talked with patient by telephone . Discussed HPI o Developed urge incontinence about 2 weeks ago while driving home from [place]. Had two episodes while driving and has frequent uncontrollable episodes since then  o Denies frequency, urinary odor, or dysuria . Reviewed and discussed medications o Not taking furosemide o Taking flomax . Message sent to PCP and requested that clinical staff schedule an appointment for evaluation  . Encouraged patient to reach out to PCP with any new or worsening symptoms . Encouraged patient to reach out to [facility] Manager as needed  Patient Self Care Activities:  . Performs ADL's independently . Performs IADL's independently  Initial goal documentation         Plan:  Patient will f/u with PCP over the next 15 days The care management team will reach out to the patient again over the next 30 days.    [name], BSN, RN-BC Embedded Chronic Care Manager [facility] [place] Management Direct Dial: [phone]

## 2019-11-15 NOTE — Telephone Encounter (Signed)
appt made for 11/20/19, pt and [name] aware

## 2019-11-15 NOTE — Telephone Encounter (Signed)
11/15/2019  Patient complains of uncontrollable urge incontinence for the past two weeks. This is a new problem for him. He urinated on himself twice while driving home from [place]. Denies any burning, frequency, or urinary odor. He also gets up every 1.5 to 2 hrs each night to urinate. This is not a new problem. He is on flomax for prostate enlargement. No problems with starting urinary stream or flow. He is prescribed furosemide but isn't taking it. Blood sugar is not overly elevated. 162 this morning after drinking a slim fast.   Also reports blood pressure elevation. Previously had problems with hypotension and meds were adjusted accordingly. BP yesterday was 148/90 and 168/99 with a pulse of 66 this morning. He is taking bisoprolol 2.5 mg daily. He is not taking furosemide and does not have any fluid retention. Advised patient to check blood pressure and record it twice a day until appointment with PCP. Advised him to reach out to PCP with any repeated readings of 150/90 or higher.  Patient will need f/u with PCP. Clinical staff should reach out to schedule. Forwarding to PCP to see if he would like to make any med adjustments for blood pressure management in the meantime.   [name], BSN, RN-BC Embedded Chronic Care Manager [facility] / [place] [facility] Direct Dial: [phone]

## 2019-11-15 NOTE — Telephone Encounter (Signed)
  Chronic Care Management   Outreach Note  11/15/2019 Name: Kenneth Fuller MRN: [id number] DOB: 1945-05-13  Referred by: [name], [name], MD Reason for referral : Chronic Care Management (RN follow up)   An unsuccessful telephone follow-up was attempted today. The patient was referred to the case management team for assistance with care management and care coordination.   Follow Up Plan: A HIPAA compliant phone message was left for the patient providing contact information and requesting a return call.  The care management team will reach out to the patient again over the next 30 days.     [name], BSN, RN-BC Embedded Chronic Care Manager [facility] / [place] [facility] Direct Dial: [phone]

## 2019-11-15 NOTE — Telephone Encounter (Signed)
Please get patient an appointment so he can be seen as soon as possible for urinary frequency and urgency

## 2019-11-20 ENCOUNTER — Other Ambulatory Visit: Payer: Self-pay

## 2019-11-20 ENCOUNTER — Ambulatory Visit (INDEPENDENT_AMBULATORY_CARE_PROVIDER_SITE_OTHER): Payer: PPO | Admitting: Family Medicine

## 2019-11-20 ENCOUNTER — Encounter: Payer: Self-pay | Admitting: Family Medicine

## 2019-11-20 VITALS — BP 175/82 | HR 61 | Temp 97.5°F | Ht 67.0 in | Wt 257.0 lb

## 2019-11-20 DIAGNOSIS — R3915 Urgency of urination: Secondary | ICD-10-CM

## 2019-11-20 DIAGNOSIS — R350 Frequency of micturition: Secondary | ICD-10-CM | POA: Diagnosis not present

## 2019-11-20 DIAGNOSIS — I10 Essential (primary) hypertension: Secondary | ICD-10-CM

## 2019-11-20 LAB — URINALYSIS, COMPLETE
Bilirubin, UA: NEGATIVE
Ketones, UA: NEGATIVE
Leukocytes,UA: NEGATIVE
Nitrite, UA: NEGATIVE
RBC, UA: NEGATIVE
Specific Gravity, UA: >1.03 — ABNORMAL HIGH (ref 1.005–1.030)
Urobilinogen, Ur: 0.2 mg/dL (ref 0.2–1.0)
pH, UA: 5 (ref 5.0–7.5)

## 2019-11-20 LAB — MICROSCOPIC EXAMINATION
Bacteria, UA: NONE SEEN
RBC, Urine: NONE SEEN /hpf (ref 0–2)
WBC, UA: NONE SEEN /hpf (ref 0–5)

## 2019-11-20 MED ORDER — TAMSULOSIN HCL 0.4 MG PO CAPS: 0.4000 mg | ORAL_CAPSULE | Freq: Every day | ORAL | 3 refills | Status: DC

## 2019-11-20 MED ORDER — HYDRALAZINE HCL 50 MG PO TABS: 50.0000 mg | ORAL_TABLET | Freq: Three times a day (TID) | ORAL | 3 refills | Status: DC
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
## 2019-11-21 ENCOUNTER — Encounter: Payer: Self-pay | Admitting: Family Medicine

## 2019-11-21 ENCOUNTER — Telehealth: Payer: Self-pay

## 2019-11-21 NOTE — Telephone Encounter (Signed)
Patient calling to check on his urine tests and also did not have the Flomax at the pharmacy.  Can this be called in and someone call him with his test results.  Thank you.

## 2019-11-22 ENCOUNTER — Other Ambulatory Visit: Payer: Self-pay | Admitting: Family Medicine

## 2019-11-22 DIAGNOSIS — R3915 Urgency of urination: Secondary | ICD-10-CM

## 2019-11-22 DIAGNOSIS — R350 Frequency of micturition: Secondary | ICD-10-CM

## 2019-11-22 LAB — URINE CULTURE

## 2019-11-22 MED ORDER — TAMSULOSIN HCL 0.4 MG PO CAPS: 0.4000 mg | ORAL_CAPSULE | Freq: Every day | ORAL | 3 refills | Status: DC

## 2019-11-22 NOTE — Telephone Encounter (Signed)
Patient aware and verbalized understanding.

## 2019-11-23 ENCOUNTER — Telehealth: Payer: Self-pay

## 2019-11-23 NOTE — Telephone Encounter (Signed)
  Incoming Patient Call  11/23/2019  What symptoms do you have? Diarrhea, throwing up, cold chills, not sure if he has a fever  How long have you been sick? Just started this morning after drinking coffee and slim fast  Have you been seen for this problem? No  If your provider decides to give you a prescription, which pharmacy would you like for it to be sent to? Pt wants to know what to do    Patient informed that this information will be sent to the clinical staff for review and that they should receive a follow up call.

## 2019-11-23 NOTE — Telephone Encounter (Signed)
Patient aware to treat symptoms and watch fever.  If he starts having SOB or chest pain he will seek medical antitension.  Patient aware since symptoms just started it would be too early to test for COVID or flu.  Will call us if he does not get better.

## 2019-11-26 ENCOUNTER — Telehealth: Payer: Self-pay | Admitting: *Deleted

## 2019-11-26 ENCOUNTER — Ambulatory Visit: Payer: PPO | Admitting: *Deleted

## 2019-11-26 DIAGNOSIS — J441 Chronic obstructive pulmonary disease with (acute) exacerbation: Secondary | ICD-10-CM | POA: Diagnosis not present

## 2019-11-26 DIAGNOSIS — E119 Type 2 diabetes mellitus without complications: Secondary | ICD-10-CM

## 2019-11-26 DIAGNOSIS — R3915 Urgency of urination: Secondary | ICD-10-CM

## 2019-11-26 DIAGNOSIS — N183 Chronic kidney disease, stage 3 unspecified: Secondary | ICD-10-CM | POA: Diagnosis not present

## 2019-11-26 DIAGNOSIS — I10 Essential (primary) hypertension: Secondary | ICD-10-CM

## 2019-11-26 NOTE — Telephone Encounter (Signed)
11/26/2019  Patient prescribed Flomax for urinary frequency/urgency but has been taking this medication for "a long time". He isn't sure why he's been taking it because he isn't aware of a dx of BPH/enlarged prostate and I can't find it documented in his chart and the urgency/frequency/incontinence is a new issue. By the looks of the refill trail, I believe an outside provider initially provided it and we picked up refilling it.   Should he continue Flomax if it is helping with his current problem and he doesn't have documented BPH/enlarged prostate? Or is there another use for it that he's unaware of?  Would you like to try Myrbetriq as mentioned in your last office note?  Forwarding to Dr [name] for review and recommendation.   [name], BSN, RN-BC Embedded Chronic Care Manager [facility] [place] [facility] Direct Dial: [phone]

## 2019-11-26 NOTE — Telephone Encounter (Signed)
Do the trial off of the Flomax first and then if he still having issues we can always try Myrbetriq in the future.

## 2019-11-26 NOTE — Chronic Care Management (AMB) (Addendum)
Chronic Care Management   Follow Up Note   11/26/2019 Name: Kenneth Fuller MRN: [id number] DOB: 23-May-1945  Referred by: [name], Kenneth Kaufmann, MD Reason for referral : Chronic Care Management (Incoming call)   TAVARIS EUDY is a 74 y.o. year old male who is a primary care patient of [name], Kenneth Kaufmann, MD. The CCM team was consulted for assistance with chronic disease management and care coordination needs.    Review of patient status, including review of consultants reports, relevant laboratory and other test results, and collaboration with appropriate care team members and the patient's provider was performed as part of comprehensive patient evaluation and provision of chronic care management services.    SDOH (Social Determinants of Health) assessments performed: No See Care Plan activities for detailed interventions related to [facility])     Outpatient Encounter Medications as of 11/26/2019  Medication Sig Note  . albuterol (ACCUNEB) 1.25 MG/3ML nebulizer solution Take 1 ampule by nebulization every 6 (six) hours as needed for wheezing.   Kenneth Fuller albuterol (PROVENTIL HFA;VENTOLIN HFA) 108 (90 Base) MCG/ACT inhaler Inhale 2 puffs into the lungs every 6 (six) hours as needed for wheezing or shortness of breath.   Kenneth Fuller atorvastatin (LIPITOR) 40 MG tablet Take 1 tablet by mouth once daily   . bisoprolol (ZEBETA) 5 MG tablet Take 0.5 tablets (2.5 mg total) by mouth daily.   . Cholecalciferol (VITAMIN D PO) Take 5,000 Units by mouth every morning.    . colchicine 0.6 MG tablet Take 1 tablet (0.6 mg total) by mouth daily. 11/15/2019: Takes as needed  . dapagliflozin propanediol (FARXIGA) 5 MG TABS tablet Take 5 mg by mouth daily. 11/15/2019: Patient taking 2.5 mg (1/2 tablet) daily  . FEROSUL 325 (65 Fe) MG tablet Take 325 mg by mouth 3 (three) times daily.    . furosemide (LASIX) 40 MG tablet Take 1 tablet (40 mg total) by mouth daily.   . hydrALAZINE (APRESOLINE) 50 MG tablet Take 1 tablet (50  mg total) by mouth 3 (three) times daily.   . isosorbide dinitrate (ISORDIL) 10 MG tablet TAKE 1 TABLET BY MOUTH THREE TIMES DAILY   . mometasone-formoterol (DULERA) 100-5 MCG/ACT AERO Inhale 2 puffs into the lungs 2 (two) times daily. 05/17/2019: [employer] Patient assistance  . polyethylene glycol (MIRALAX / GLYCOLAX) 17 g packet Take 17 g by mouth daily. (Patient taking differently: Take 17 g by mouth daily as needed. )   . sertraline (ZOLOFT) 100 MG tablet Take 1 tablet (100 mg total) by mouth daily.   . tamsulosin (FLOMAX) 0.4 MG CAPS capsule Take 1 capsule (0.4 mg total) by mouth daily after supper.    No facility-administered encounter medications on file as of 11/26/2019.      Goals Addressed              This Visit's Progress     Patient Stated   .  "I want to keep my blood pressure under control" (pt-stated)        Current Barriers:  Kenneth Fuller Knowledge Deficits related to acute on chronic elevation of blood pressure . Chronic Disease Management support and education needs related to Hypertension  Nurse Case Manager Clinical Goal(s):  Kenneth Fuller Over the next 60 days, patient will talk with RN CCM regarding blood pressure management . Over the next 60 days, patient will take all medications as prescribed . Over the next 30 days, patient will check and record blood pressure twice daily and reach out to PCP or cardiologist with  any readings outside of recommended range  Interventions:  . Talked with patient by telephone today . Encouraged patient to check and record blood pressure and pulse twice daily . Chart reviewed including recent office notes and lab results . Discussed home readings . Reviewed and discussed medications o Hydralazine added back at last OV o Patient taking as prescribed . Denies any chest pain or fluid retention  . Encouraged patient to reach out to PCP with any readings outside of recommended range . Encouraged patient ot reach out to [facility] Manager as  needed  Patient Self Care Activities:  . Performs ADL's independently . Performs IADL's independently  Please see past updates related to this goal by clicking on the "Past Updates" button in the selected goal        Other   .  "I need to talk with someone about incontinence"        CARE PLAN ENTRY (see longitudinal plan of care for additional care plan information)  Current Barriers:  . Care Coordination needs related to new onset urge incontinence in a patient with CKD  Nurse Case Manager Clinical Goal(s):  Kenneth Fuller Over the next 30 days, patient will f/u with PCP regarding any new, persistent, or worsening problems . Over the next 60 days, patient will talk with RN Care Manager regarding incontinence management   Interventions:  . Inter-disciplinary care team collaboration (see longitudinal plan of care) . Talked with patient by telephone . Discussed urge incontinence and frequency . Reviewed and discussed medications o Has been taking flomax for "a long time" o Prescribed flomax at most recent OV visit and Myrbetriq was discussed as another option . Collaborated with [place] regarding rx for flomax [phone] . Advised patient that he picked up a script on 10/1 for 30 caps and that he has two script son hold for 90 capsules each  . Message sent to PCP advising that he has been taking flomax even before symptoms began. Questioned if he wants to try Myrbetriq . Collaborated with Dr Warrick Parisian o He recommended patient d/c flomax for two weeks and report if symptoms worsen or improve during that time o If not improved, will restart flomax and add myrbetriq  . Patient informed of providers recommendations and encouraged to reach out to PCP or RN Care manager over the next two weeks . Encouraged patient to reach out to PCP with any new or worsening symptoms . Encouraged patient to reach out to [facility] Manager as needed  Patient Self Care Activities:  . Performs ADL's  independently . Performs IADL's independently  Please see past updates related to this goal by clicking on the "Past Updates" button in the selected goal         Plan:   The care management team will reach out to the patient again over the next 30 days.  Follow up with provider re: urinary incontinence   [name], BSN, RN-BC [place] / [place] [facility] Direct Dial: [phone]

## 2019-11-26 NOTE — Telephone Encounter (Signed)
you can tell him to trial Flomax for 2 weeks and then if it comes back or gets worse then have him go back on it.

## 2019-11-26 NOTE — Patient Instructions (Signed)
Visit Information  Goals Addressed              This Visit's Progress     Patient Stated   .  "I want to keep my blood pressure under control" (pt-stated)        Current Barriers:  [name] Knowledge Deficits related to acute on chronic elevation of blood pressure . Chronic Disease Management support and education needs related to Hypertension  Nurse Case Manager Clinical Goal(s):  [name] Over the next 60 days, patient will talk with RN CCM regarding blood pressure management . Over the next 60 days, patient will take all medications as prescribed . Over the next 30 days, patient will check and record blood pressure twice daily and reach out to PCP or cardiologist with any readings outside of recommended range  Interventions:  . Talked with patient by telephone today . Encouraged patient to check and record blood pressure and pulse twice daily . Chart reviewed including recent office notes and lab results . Discussed home readings . Reviewed and discussed medications o Hydralazine added back at last OV o Patient taking as prescribed . Denies any chest pain or fluid retention  . Encouraged patient to reach out to PCP with any readings outside of recommended range . Encouraged patient ot reach out to [facility] Manager as needed  Patient Self Care Activities:  . Performs ADL's independently . Performs IADL's independently  Please see past updates related to this goal by clicking on the "Past Updates" button in the selected goal        Other   .  "I need to talk with someone about incontinence"        CARE PLAN ENTRY (see longitudinal plan of care for additional care plan information)  Current Barriers:  . Care Coordination needs related to new onset urge incontinence in a patient with CKD  Nurse Case Manager Clinical Goal(s):  [name] Over the next 30 days, patient will f/u with PCP regarding any new, persistent, or worsening problems . Over the next 60 days, patient will talk with RN  Care Manager regarding incontinence management   Interventions:  . Inter-disciplinary care team collaboration (see longitudinal plan of care) . Talked with patient by telephone . Discussed urge incontinence and frequency . Reviewed and discussed medications o Has been taking flomax for "a long time" o Prescribed flomax at most recent OV visit and Myrbetriq was discussed as another option . Collaborated with [place] regarding rx for flomax [phone] . Advised patient that he picked up a script on 10/1 for 30 caps and that he has two script son hold for 90 capsules each  . Message sent to PCP advising that he has been taking flomax even before symptoms began. Questioned if he wants to try Myrbetriq . Also mentioned to provided that patient isn't aware of why he is taking flomax because he doesn't have a hx of prostate enlargement or BPH . Encouraged patient to reach out to PCP with any new or worsening symptoms . Encouraged patient to reach out to [facility] Manager as needed  Patient Self Care Activities:  . Performs ADL's independently . Performs IADL's independently  Please see past updates related to this goal by clicking on the "Past Updates" button in the selected goal         Patient verbalizes understanding of instructions provided today.   Follow-up Plan The care management team will reach out to the patient again over the next 30  days.  Follow up with provider re: urinary incontinence  [name], BSN, RN-BC [place] / [place] Management Direct Dial: [phone]

## 2019-11-26 NOTE — Telephone Encounter (Signed)
Advised to stop Flomax for two weeks and to let you know if symptoms worsen in the meantime. I will f/u in two two weeks to see if they have improved.   Kenneth Fuller

## 2019-11-28 ENCOUNTER — Ambulatory Visit: Payer: PPO | Admitting: Gastroenterology

## 2019-11-28 ENCOUNTER — Encounter: Payer: Self-pay | Admitting: Gastroenterology

## 2019-11-28 DIAGNOSIS — R1013 Epigastric pain: Secondary | ICD-10-CM

## 2019-11-28 DIAGNOSIS — R1310 Dysphagia, unspecified: Secondary | ICD-10-CM | POA: Diagnosis not present

## 2019-11-28 DIAGNOSIS — Z800 Family history of malignant neoplasm of digestive organs: Secondary | ICD-10-CM | POA: Diagnosis not present

## 2019-11-28 DIAGNOSIS — D509 Iron deficiency anemia, unspecified: Secondary | ICD-10-CM

## 2019-11-28 DIAGNOSIS — K219 Gastro-esophageal reflux disease without esophagitis: Secondary | ICD-10-CM | POA: Diagnosis not present

## 2019-11-28 MED ORDER — OMEPRAZOLE 40 MG PO CPDR: 40.0000 mg | DELAYED_RELEASE_CAPSULE | Freq: Every day | ORAL | 6 refills | Status: DC

## 2019-11-28 MED ORDER — SUPREP BOWEL PREP KIT 17.5-3.13-1.6 GM/177ML PO SOLN: 1.0000 | ORAL | 0 refills | Status: DC

## 2019-11-28 NOTE — Progress Notes (Signed)
She states review of pertinent gastrointestinal problems: 1. Adenomatous polyp 2008, small; repeat colonoscopy 01/2011 for minor rectal bleeding found small HP, hemorrhoids;[name]  Colonoscopy June 2017 for dark stools Dr. Ardis Hughs found 3 subcentimeter polyps.  The examination was otherwise normal.  Pathology showed these were adenomatous polyps. 2. EGD normal 2008 for chest discomfort.    EGD June 2017 for dark stools, melena and it was completely normal. 3. Intermittent abd pains 01/2012  CT, [place], CBC, cmet all essentially normal.  EGD Dr. Ardis Hughs 02/2012 showed mild gastritis, H. plori neg  HPI: This is a very pleasant 74 year old man who was referred to me by Liana Gerold, MD  to evaluate iron deficiency anemia.     I Last saw him in 2017 for FOBT negative, nonanemic work-up for black-colored stools.  I performed upper endoscopy and colonoscopy.  See those results summarized above.  Since he was not anemic and did not have microscopic blood in his stool I did not recommend any further testing  CBC September 2021 showed hemoglobin 15.5, MCV 86.  CBC in care everywhere July 2021 showed hemoglobin 13.3 MCV 82  CBC April 2021 hemoglobin 12, MCV 83, RDW 16.  Iron testing July 2021 through care everywhere system showed normal total iron however his iron saturation was very slightly low at 14  He was started on iron several months ago after his hemoglobin was found to be slightly low at 12 and his iron saturation was also slightly low.  He has been taking 3 times daily iron orally since then.  He believes a couple months before this he started noticing dark stools.  Certainly it has been more problematic since he started but he does think dark stools started before his iron.  He has intermittent abdominal burning, this is after he eats.  He says it is a sore throat in the middle of his abdomen.  It is a gnawing type pain.  He has changed his diet recently avoiding a lot of sugars and carbs and has  lost 40 pounds in the past 6 months.  He has pyrosis, heartburn at times.  He does not take any antiacid medicine.  He has mild dysphagia.  He does not take NSAIDs routinely.   Review of systems: Pertinent positive and negative review of systems were noted in the above HPI section. All other review negative.   Past Medical History:  Diagnosis Date  . Allergy   . Asthma   . CAD (coronary artery disease)   . CHF (congestive heart failure) ([place])   . Diabetes mellitus   . Fibromyalgia   . GERD (gastroesophageal reflux disease)   . GI bleeding   . Gout   . Hyperlipidemia   . Hypertension   . Hypogonadism male   . Insomnia   . MRSA cellulitis   . Neuropathy   . Obesity   . Shortness of breath dyspnea    with exertion   . Sleep apnea    cpap- 14   . Wheezing    no asthma diagnosis    Past Surgical History:  Procedure Laterality Date  . BACK SURGERY    . CARDIAC CATHETERIZATION    . COLONOSCOPY    . LEFT HEART CATH AND CORONARY ANGIOGRAPHY N/A 12/02/2016   Procedure: LEFT HEART CATH AND CORONARY ANGIOGRAPHY;  Surgeon: Jettie Booze, MD;  Location: [facility];  Service: Cardiovascular;  Laterality: N/A;  . LUMBAR LAMINECTOMY/DECOMPRESSION MICRODISCECTOMY N/A 01/02/2014   Procedure: CENTRAL DECOMPRESSION  LUMBAR LAMINECTOMY L3-L4, L4-L5;  Surgeon: Tobi Bastos, MD;  Location: [facility];  Service: Orthopedics;  Laterality: N/A;  . neck fusion      Current Outpatient Medications  Medication Sig Dispense Refill  . albuterol (ACCUNEB) 1.25 MG/3ML nebulizer solution Take 1 ampule by nebulization every 6 (six) hours as needed for wheezing.    [name] albuterol (PROVENTIL HFA;VENTOLIN HFA) 108 (90 Base) MCG/ACT inhaler Inhale 2 puffs into the lungs every 6 (six) hours as needed for wheezing or shortness of breath. 1 Inhaler 2  . atorvastatin (LIPITOR) 40 MG tablet Take 1 tablet by mouth once daily 90 tablet 4  . bisoprolol (ZEBETA) 5 MG tablet Take 0.5 tablets (2.5 mg  total) by mouth daily. 45 tablet 3  . Cholecalciferol (VITAMIN D PO) Take 5,000 Units by mouth every morning.     . colchicine 0.6 MG tablet Take 1 tablet (0.6 mg total) by mouth daily. 20 tablet 1  . dapagliflozin propanediol (FARXIGA) 5 MG TABS tablet Take 5 mg by mouth daily.    . FEROSUL 325 (65 Fe) MG tablet Take 325 mg by mouth 3 (three) times daily.     . hydrALAZINE (APRESOLINE) 50 MG tablet Take 1 tablet (50 mg total) by mouth 3 (three) times daily. 90 tablet 3  . isosorbide dinitrate (ISORDIL) 10 MG tablet TAKE 1 TABLET BY MOUTH THREE TIMES DAILY 90 tablet 3  . mometasone-formoterol (DULERA) 100-5 MCG/ACT AERO Inhale 2 puffs into the lungs 2 (two) times daily.    . polyethylene glycol (MIRALAX / GLYCOLAX) 17 g packet Take 17 g by mouth daily. (Patient taking differently: Take 17 g by mouth daily as needed. ) 30 each 0  . sertraline (ZOLOFT) 100 MG tablet Take 1 tablet (100 mg total) by mouth daily. 90 tablet 3  . tamsulosin (FLOMAX) 0.4 MG CAPS capsule Take 1 capsule (0.4 mg total) by mouth daily after supper. 90 capsule 3   No current facility-administered medications for this visit.    Allergies as of 11/28/2019 - Review Complete 11/28/2019  Allergen Reaction Noted  . Amlodipine besy-benazepril hcl Swelling and Other (See Comments) 06/24/2010  . Oxycodone Itching 07/11/2017  . Phenergan [promethazine hcl] Other (See Comments) 06/12/2012  . Hydrocodone Itching 02/13/2013    Family History  Problem Relation Age of Onset  . Colon cancer Mother   . Diabetes Father        siblings  . Heart disease Father        brother  . Heart attack Father   . Kidney disease Sister   . Heart failure Sister   . Heart disease Brother   . Heart attack Son 39  . Drug abuse Sister   . Heart disease Brother   . Deep vein thrombosis Brother   . Colon polyps Neg Hx     Social History   Socioeconomic History  . Marital status: Married    Spouse name: Butch Penny  . Number of children: 3  .  Years of education: 8  . Highest education level: GED or equivalent  Occupational History  . 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 Smoking status: Former Smoker    Quit date: 02/08/1969    Years since quitting: 50.8  . Smokeless tobacco: Former Systems developer    Quit date: Pharmacologist  . Vaping Use: Never used  Substance and Sexual Activity  . Alcohol use: No    Alcohol/week:  0.0 standard drinks  . Drug use: No  . Sexual activity: Not Currently  Other Topics Concern  . Not on file  Social History Narrative   Drinks caffeine tea occasionally    Social Determinants of Health   Financial Resource Strain: Low Risk   . Difficulty of Paying Living Expenses: Not hard at all  Food Insecurity: No Food Insecurity  . Worried About Running Out of Food in the Last Year: Never true  . Ran Out of Food in the Last Year: Never true  Transportation Needs: No Transportation Needs  . Lack of Transportation (Medical): No  . Lack of Transportation (Non-Medical): No  Physical Activity: Inactive  . Days of Exercise per Week: 0 days  . Minutes of Exercise per Session: 0 min  Stress: No Stress Concern Present  . Feeling of Stress : Not at all  Social Connections: Socially Integrated  . Frequency of Communication with Friends and Family: More than three times a week  . Frequency of Social Gatherings with Friends and Family: More than three times a week  . Attends Religious Services: More than 4 times per year  . Active Member of Clubs or Organizations: Yes  . Attends Club or Organization Meetings: More than 4 times per year  . Marital Status: Married  Intimate Partner Violence: Not At Risk  . Fear of Current or Ex-Partner: No  . Emotionally Abused: No  . Physically Abused: No  . Sexually Abused: No     Physical Exam: BP 140/70   Pulse 67   Ht 5' 7" (1.702 m)   Wt 256 lb (116.1 kg)   BMI 40.10 kg/m  Constitutional: generally  well-appearing Psychiatric: alert and oriented x3 Eyes: extraocular movements intact Mouth: oral pharynx moist, no lesions Neck: supple no lymphadenopathy Cardiovascular: heart regular rate and rhythm Lungs: clear to auscultation bilaterally Abdomen: soft, mildly tender in the midepigastrium, nondistended, no obvious ascites, no peritoneal signs, normal bowel sounds Extremities: no lower extremity edema bilaterally Skin: no lesions on visible extremities   Assessment and plan: 74 y.o. male with dark stools, iron deficiency anemia, epigastric pain, weight loss, intentional  He has a family history of colon cancer in his mother had colon cancer when she was 65.  He has had precancerous colon polyps himself.  He was "due" for colonoscopy about a year ago.  I recommended we proceed with that as well as upper endoscopy for his GERD and intermittent dysphagia.  He is going to start taking a proton pump inhibitor 40 mg omeprazole, I wrote a new prescription for that today.  Once daily until the time of his upper endoscopy.  I do have some concern about possible underlying neoplasm here.  He has lost 40 pounds in the past 6 months however this was with quite a lot of intention.  He also has some abdominal discomforts that a gnawing like pain and he was slightly tender in the midepigastrium.  He has chronic kidney disease, stage III per his recollection.  Most recent creatinine was 1.1, last month.  I would like to get imaging of his abdomen with his tenderness of his weight loss but I am hesitant to order a dye load with IV contrast CT scan and so instead I am getting abdominal ultrasound.   Please see the "Patient Instructions" section for addition details about the plan.   Brennah Quraishi, MD [facility] 11/28/2019, 10:50 AM  Cc: Bhutani, Manpreet S, MD  Total time on date of encounter was   45 minutes (this included time spent preparing to see the patient reviewing records; obtaining  and/or reviewing separately obtained history; performing a medically appropriate exam and/or evaluation; counseling and educating the patient and family if present; ordering medications, tests or procedures if applicable; and documenting clinical information in the health record).

## 2019-11-28 NOTE — Telephone Encounter (Signed)
Perfect sounds good, let me know

## 2019-11-28 NOTE — Addendum Note (Signed)
Addended by: Stevan Born on: 11/28/2019 04:31 PM   Modules accepted: Orders

## 2019-11-28 NOTE — Addendum Note (Signed)
Addended by: Stevan Born on: 11/28/2019 04:16 PM   Modules accepted: Orders, SmartSet

## 2019-11-28 NOTE — H&P (View-Only) (Signed)
She states review of pertinent gastrointestinal problems: 1. Adenomatous polyp 2008, small; repeat colonoscopy 01/2011 for minor rectal bleeding found small HP, hemorrhoids;[name]  Colonoscopy June 2017 for dark stools Dr. Ardis Hughs found 3 subcentimeter polyps.  The examination was otherwise normal.  Pathology showed these were adenomatous polyps. 2. EGD normal 2008 for chest discomfort.    EGD June 2017 for dark stools, melena and it was completely normal. 3. Intermittent abd pains 01/2012  CT, [place], CBC, cmet all essentially normal.  EGD Dr. Ardis Hughs 02/2012 showed mild gastritis, H. plori neg  HPI: This is a very pleasant 74 year old man who was referred to me by Liana Gerold, MD  to evaluate iron deficiency anemia.     I Last saw him in 2017 for FOBT negative, nonanemic work-up for black-colored stools.  I performed upper endoscopy and colonoscopy.  See those results summarized above.  Since he was not anemic and did not have microscopic blood in his stool I did not recommend any further testing  CBC September 2021 showed hemoglobin 15.5, MCV 86.  CBC in care everywhere July 2021 showed hemoglobin 13.3 MCV 82  CBC April 2021 hemoglobin 12, MCV 83, RDW 16.  Iron testing July 2021 through care everywhere system showed normal total iron however his iron saturation was very slightly low at 14  He was started on iron several months ago after his hemoglobin was found to be slightly low at 12 and his iron saturation was also slightly low.  He has been taking 3 times daily iron orally since then.  He believes a couple months before this he started noticing dark stools.  Certainly it has been more problematic since he started but he does think dark stools started before his iron.  He has intermittent abdominal burning, this is after he eats.  He says it is a sore throat in the middle of his abdomen.  It is a gnawing type pain.  He has changed his diet recently avoiding a lot of sugars and carbs and has  lost 40 pounds in the past 6 months.  He has pyrosis, heartburn at times.  He does not take any antiacid medicine.  He has mild dysphagia.  He does not take NSAIDs routinely.   Review of systems: Pertinent positive and negative review of systems were noted in the above HPI section. All other review negative.   Past Medical History:  Diagnosis Date  . Allergy   . Asthma   . CAD (coronary artery disease)   . CHF (congestive heart failure) ([place])   . Diabetes mellitus   . Fibromyalgia   . GERD (gastroesophageal reflux disease)   . GI bleeding   . Gout   . Hyperlipidemia   . Hypertension   . Hypogonadism male   . Insomnia   . MRSA cellulitis   . Neuropathy   . Obesity   . Shortness of breath dyspnea    with exertion   . Sleep apnea    cpap- 14   . Wheezing    no asthma diagnosis    Past Surgical History:  Procedure Laterality Date  . BACK SURGERY    . CARDIAC CATHETERIZATION    . COLONOSCOPY    . LEFT HEART CATH AND CORONARY ANGIOGRAPHY N/A 12/02/2016   Procedure: LEFT HEART CATH AND CORONARY ANGIOGRAPHY;  Surgeon: Jettie Booze, MD;  Location: [facility];  Service: Cardiovascular;  Laterality: N/A;  . LUMBAR LAMINECTOMY/DECOMPRESSION MICRODISCECTOMY N/A 01/02/2014   Procedure: CENTRAL DECOMPRESSION  LUMBAR LAMINECTOMY L3-L4, L4-L5;  Surgeon: Tobi Bastos, MD;  Location: [facility];  Service: Orthopedics;  Laterality: N/A;  . neck fusion      Current Outpatient Medications  Medication Sig Dispense Refill  . albuterol (ACCUNEB) 1.25 MG/3ML nebulizer solution Take 1 ampule by nebulization every 6 (six) hours as needed for wheezing.    [name] albuterol (PROVENTIL HFA;VENTOLIN HFA) 108 (90 Base) MCG/ACT inhaler Inhale 2 puffs into the lungs every 6 (six) hours as needed for wheezing or shortness of breath. 1 Inhaler 2  . atorvastatin (LIPITOR) 40 MG tablet Take 1 tablet by mouth once daily 90 tablet 4  . bisoprolol (ZEBETA) 5 MG tablet Take 0.5 tablets (2.5 mg  total) by mouth daily. 45 tablet 3  . Cholecalciferol (VITAMIN D PO) Take 5,000 Units by mouth every morning.     . colchicine 0.6 MG tablet Take 1 tablet (0.6 mg total) by mouth daily. 20 tablet 1  . dapagliflozin propanediol (FARXIGA) 5 MG TABS tablet Take 5 mg by mouth daily.    . FEROSUL 325 (65 Fe) MG tablet Take 325 mg by mouth 3 (three) times daily.     . hydrALAZINE (APRESOLINE) 50 MG tablet Take 1 tablet (50 mg total) by mouth 3 (three) times daily. 90 tablet 3  . isosorbide dinitrate (ISORDIL) 10 MG tablet TAKE 1 TABLET BY MOUTH THREE TIMES DAILY 90 tablet 3  . mometasone-formoterol (DULERA) 100-5 MCG/ACT AERO Inhale 2 puffs into the lungs 2 (two) times daily.    . polyethylene glycol (MIRALAX / GLYCOLAX) 17 g packet Take 17 g by mouth daily. (Patient taking differently: Take 17 g by mouth daily as needed. ) 30 each 0  . sertraline (ZOLOFT) 100 MG tablet Take 1 tablet (100 mg total) by mouth daily. 90 tablet 3  . tamsulosin (FLOMAX) 0.4 MG CAPS capsule Take 1 capsule (0.4 mg total) by mouth daily after supper. 90 capsule 3   No current facility-administered medications for this visit.    Allergies as of 11/28/2019 - Review Complete 11/28/2019  Allergen Reaction Noted  . Amlodipine besy-benazepril hcl Swelling and Other (See Comments) 06/24/2010  . Oxycodone Itching 07/11/2017  . Phenergan [promethazine hcl] Other (See Comments) 06/12/2012  . Hydrocodone Itching 02/13/2013    Family History  Problem Relation Age of Onset  . Colon cancer Mother   . Diabetes Father        siblings  . Heart disease Father        brother  . Heart attack Father   . Kidney disease Sister   . Heart failure Sister   . Heart disease Brother   . Heart attack Son 39  . Drug abuse Sister   . Heart disease Brother   . Deep vein thrombosis Brother   . Colon polyps Neg Hx     Social History   Socioeconomic History  . Marital status: Married    Spouse name: Butch Penny  . Number of children: 3  .  Years of education: 8  . Highest education level: GED or equivalent  Occupational History  . 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 Smoking status: Former Smoker    Quit date: 02/08/1969    Years since quitting: 50.8  . Smokeless tobacco: Former Systems developer    Quit date: Pharmacologist  . Vaping Use: Never used  Substance and Sexual Activity  . Alcohol use: No    Alcohol/week:  0.0 standard drinks  . Drug use: No  . Sexual activity: Not Currently  Other Topics Concern  . Not on file  Social History Narrative   Drinks caffeine tea occasionally    Social Determinants of Health   Financial Resource Strain: Low Risk   . Difficulty of Paying Living Expenses: Not hard at all  Food Insecurity: No Food Insecurity  . Worried About Charity fundraiser in the Last Year: Never true  . Ran Out of Food in the Last Year: Never true  Transportation Needs: No Transportation Needs  . Lack of Transportation (Medical): No  . Lack of Transportation (Non-Medical): No  Physical Activity: Inactive  . Days of Exercise per Week: 0 days  . Minutes of Exercise per Session: 0 min  Stress: No Stress Concern Present  . Feeling of Stress : Not at all  Social Connections: Socially Integrated  . Frequency of Communication with Friends and Family: More than three times a week  . Frequency of Social Gatherings with Friends and Family: More than three times a week  . Attends Religious Services: More than 4 times per year  . Active Member of Clubs or Organizations: Yes  . Attends Archivist Meetings: More than 4 times per year  . Marital Status: Married  Human resources officer Violence: Not At Risk  . Fear of Current or Ex-Partner: No  . Emotionally Abused: No  . Physically Abused: No  . Sexually Abused: No     Physical Exam: BP 140/70   Pulse 67   Ht _0  (1.702 m)   Wt 256 lb (116.1 kg)   BMI 40.10 kg/m  Constitutional: generally  well-appearing Psychiatric: alert and oriented x3 Eyes: extraocular movements intact Mouth: oral pharynx moist, no lesions Neck: supple no lymphadenopathy Cardiovascular: heart regular rate and rhythm Lungs: clear to auscultation bilaterally Abdomen: soft, mildly tender in the midepigastrium, nondistended, no obvious ascites, no peritoneal signs, normal bowel sounds Extremities: no lower extremity edema bilaterally Skin: no lesions on visible extremities   Assessment and plan: 74 y.o. male with dark stools, iron deficiency anemia, epigastric pain, weight loss, intentional  He has a family history of colon cancer in his mother had colon cancer when she was 32.  He has had precancerous colon polyps himself.  He was "due" for colonoscopy about a year ago.  I recommended we proceed with that as well as upper endoscopy for his GERD and intermittent dysphagia.  He is going to start taking a proton pump inhibitor 40 mg omeprazole, I wrote a new prescription for that today.  Once daily until the time of his upper endoscopy.  I do have some concern about possible underlying neoplasm here.  He has lost 40 pounds in the past 6 months however this was with quite a lot of intention.  He also has some abdominal discomforts that a gnawing like pain and he was slightly tender in the midepigastrium.  He has chronic kidney disease, stage III per his recollection.  Most recent creatinine was 1.1, last month.  I would like to get imaging of his abdomen with his tenderness of his weight loss but I am hesitant to order a dye load with IV contrast CT scan and so instead I am getting abdominal ultrasound.   Please see the "Patient Instructions" section for addition details about the plan.   Owens Loffler, MD [facility] 11/28/2019, 10:50 AM  Cc: Liana Gerold, MD  Total time on date of encounter was  45 minutes (this included time spent preparing to see the patient reviewing records; obtaining  and/or reviewing separately obtained history; performing a medically appropriate exam and/or evaluation; counseling and educating the patient and family if present; ordering medications, tests or procedures if applicable; and documenting clinical information in the health record).

## 2019-11-28 NOTE — Patient Instructions (Addendum)
If you are age 73 or older, your body mass index should be between 23-30. Your Body mass index is 40.1 kg/m. If this is out of the aforementioned range listed, please consider follow up with your Primary Care Provider.  If you are age 19 or younger, your body mass index should be between 19-25. Your Body mass index is 40.1 kg/m. If this is out of the aformentioned range listed, please consider follow up with your Primary Care Provider.   You have been scheduled for an abdominal ultrasound at [facility] (1st floor of hospital) on 12-03-19 at 9:00am. Please arrive 15 minutes prior to your appointment for registration. Make certain not to have anything to eat or drink after midnight the night prior to your appointment. Should you need to reschedule your appointment, please contact radiology at [phone]. This test typically takes about 30 minutes to perform.  You have been scheduled for an endoscopy and colonoscopy. Please follow the written instructions given to you at your visit today. Please pick up your prep supplies at the pharmacy within the next 1-3 days. If you use inhalers (even only as needed), please bring them with you on the day of your procedure.  Due to recent changes in healthcare laws, you may see the results of your imaging and laboratory studies on MyChart before your provider has had a chance to review them.  We understand that in some cases there may be results that are confusing or concerning to you. Not all laboratory results come back in the same time frame and the provider may be waiting for multiple results in order to interpret others.  Please give [place] 48 hours in order for your provider to thoroughly review all the results before contacting the office for clarification of your results.    We have sent the following medications to your pharmacy for you to pick up at your convenience:  START: omeprazole 44m one capsule every morning 30 to 60 minutes prior to  breakfast  Thank you for entrusting me with your care and choosing [facility]  Dr JArdis Hughs

## 2019-11-29 ENCOUNTER — Telehealth: Payer: PPO

## 2019-11-30 ENCOUNTER — Encounter: Payer: PPO | Admitting: Gastroenterology

## 2019-12-03 ENCOUNTER — Other Ambulatory Visit: Payer: Self-pay

## 2019-12-03 ENCOUNTER — Other Ambulatory Visit: Payer: PPO

## 2019-12-03 ENCOUNTER — Ambulatory Visit (HOSPITAL_COMMUNITY)
Admission: RE | Admit: 2019-12-03 | Discharge: 2019-12-03 | Disposition: A | Discharge status: Home / Self Care | Payer: PPO | Source: Ambulatory Visit | Attending: Gastroenterology | Admitting: Gastroenterology

## 2019-12-03 DIAGNOSIS — R1310 Dysphagia, unspecified: Secondary | ICD-10-CM | POA: Insufficient documentation

## 2019-12-03 DIAGNOSIS — Z800 Family history of malignant neoplasm of digestive organs: Secondary | ICD-10-CM

## 2019-12-03 DIAGNOSIS — E1122 Type 2 diabetes mellitus with diabetic chronic kidney disease: Secondary | ICD-10-CM | POA: Diagnosis not present

## 2019-12-03 DIAGNOSIS — N189 Chronic kidney disease, unspecified: Secondary | ICD-10-CM | POA: Diagnosis not present

## 2019-12-03 DIAGNOSIS — N170 Acute kidney failure with tubular necrosis: Secondary | ICD-10-CM | POA: Diagnosis not present

## 2019-12-03 DIAGNOSIS — R1013 Epigastric pain: Secondary | ICD-10-CM | POA: Diagnosis not present

## 2019-12-03 DIAGNOSIS — E611 Iron deficiency: Secondary | ICD-10-CM | POA: Diagnosis not present

## 2019-12-03 DIAGNOSIS — I129 Hypertensive chronic kidney disease with stage 1 through stage 4 chronic kidney disease, or unspecified chronic kidney disease: Secondary | ICD-10-CM | POA: Diagnosis not present

## 2019-12-03 DIAGNOSIS — K219 Gastro-esophageal reflux disease without esophagitis: Secondary | ICD-10-CM | POA: Diagnosis not present

## 2019-12-03 DIAGNOSIS — D509 Iron deficiency anemia, unspecified: Secondary | ICD-10-CM | POA: Diagnosis not present

## 2019-12-05 ENCOUNTER — Other Ambulatory Visit: Payer: Self-pay

## 2019-12-07 ENCOUNTER — Telehealth: Payer: Self-pay

## 2019-12-07 DIAGNOSIS — H04123 Dry eye syndrome of bilateral lacrimal glands: Secondary | ICD-10-CM | POA: Diagnosis not present

## 2019-12-07 DIAGNOSIS — H40033 Anatomical narrow angle, bilateral: Secondary | ICD-10-CM | POA: Diagnosis not present

## 2019-12-07 MED ORDER — MIRABEGRON ER 25 MG PO TB24: 25.0000 mg | ORAL_TABLET | Freq: Every day | ORAL | 3 refills | Status: DC

## 2019-12-07 NOTE — Telephone Encounter (Signed)
Patient states that he is not getting any sleep at night at all. He is taking the flomax and he is getting up every hour to urinate. Please advise

## 2019-12-07 NOTE — Telephone Encounter (Signed)
Have him try Myrbetriq

## 2019-12-07 NOTE — Telephone Encounter (Signed)
Pt was LM to start new med that has already been sent to [facility]

## 2019-12-14 ENCOUNTER — Other Ambulatory Visit: Payer: Self-pay | Admitting: Family Medicine

## 2019-12-14 ENCOUNTER — Ambulatory Visit: Payer: PPO | Admitting: *Deleted

## 2019-12-14 DIAGNOSIS — J441 Chronic obstructive pulmonary disease with (acute) exacerbation: Secondary | ICD-10-CM

## 2019-12-14 DIAGNOSIS — I10 Essential (primary) hypertension: Secondary | ICD-10-CM

## 2019-12-14 DIAGNOSIS — N183 Chronic kidney disease, stage 3 unspecified: Secondary | ICD-10-CM

## 2019-12-14 DIAGNOSIS — I5042 Chronic combined systolic (congestive) and diastolic (congestive) heart failure: Secondary | ICD-10-CM

## 2019-12-14 NOTE — Chronic Care Management (AMB) (Signed)
Chronic Care Management   Follow Up Note   12/14/2019 Name: Kenneth Fuller MRN: [id number] DOB: 1945/06/24  Referred by: [name], [name], MD Reason for referral : Chronic Care Management (RN follow up)   Kenneth Fuller is a 74 y.o. year old male who is a primary care patient of [name], [name], MD. The CCM team was consulted for assistance with chronic disease management and care coordination needs.    Review of patient status, including review of consultants reports, relevant laboratory and other test results, and collaboration with appropriate care team members and the patient's provider was performed as part of comprehensive patient evaluation and provision of chronic care management services.    SDOH (Social Determinants of Health) assessments performed: No See Care Plan activities for detailed interventions related to [facility])     Outpatient Encounter Medications as of 12/14/2019  Medication Sig Note  . albuterol (ACCUNEB) 1.25 MG/3ML nebulizer solution Take 1 ampule by nebulization every 6 (six) hours as needed for wheezing.   [name] albuterol (PROVENTIL HFA;VENTOLIN HFA) 108 (90 Base) MCG/ACT inhaler Inhale 2 puffs into the lungs every 6 (six) hours as needed for wheezing or shortness of breath.   [name] atorvastatin (LIPITOR) 40 MG tablet Take 1 tablet by mouth once daily   . bisoprolol (ZEBETA) 5 MG tablet Take 0.5 tablets (2.5 mg total) by mouth daily.   . Cholecalciferol (VITAMIN D PO) Take 5,000 Units by mouth every morning.    . colchicine 0.6 MG tablet Take 1 tablet (0.6 mg total) by mouth daily. 11/15/2019: Takes as needed  . dapagliflozin propanediol (FARXIGA) 5 MG TABS tablet Take 5 mg by mouth daily. 11/15/2019: Patient taking 2.5 mg (1/2 tablet) daily  . FEROSUL 325 (65 Fe) MG tablet Take 325 mg by mouth 3 (three) times daily.    . hydrALAZINE (APRESOLINE) 50 MG tablet Take 1 tablet (50 mg total) by mouth 3 (three) times daily.   . isosorbide dinitrate (ISORDIL) 10 MG  tablet TAKE 1 TABLET BY MOUTH THREE TIMES DAILY   . mirabegron ER (MYRBETRIQ) 25 MG TB24 tablet Take 1 tablet (25 mg total) by mouth daily.   . mometasone-formoterol (DULERA) 100-5 MCG/ACT AERO Inhale 2 puffs into the lungs 2 (two) times daily. 05/17/2019: [employer] Patient assistance  . Na Sulfate-K Sulfate-Mg Sulf (SUPREP BOWEL PREP KIT) 17.5-3.13-1.6 GM/177ML SOLN Take 1 kit by mouth as directed.   [name] omeprazole (PRILOSEC) 40 MG capsule Take 1 capsule (40 mg total) by mouth daily. Take 30-60 minutes prior to breakfast.   . polyethylene glycol (MIRALAX / GLYCOLAX) 17 g packet Take 17 g by mouth daily. (Patient taking differently: Take 17 g by mouth daily as needed. )   . sertraline (ZOLOFT) 100 MG tablet Take 1 tablet (100 mg total) by mouth daily.   . tamsulosin (FLOMAX) 0.4 MG CAPS capsule Take 1 capsule (0.4 mg total) by mouth daily after supper. (Patient not taking: Reported on 11/28/2019) 11/28/2019: Stopped for 2 weeks   No facility-administered encounter medications on file as of 12/14/2019.    Goals Addressed              This Visit's Progress     Patient Stated   .  COPD Symptom Management (pt-stated)        Current Barriers:  . Chronic Disease Management support and education needs related to COPD & shortness of breath  Nurse Case Manager Clinical Goal(s):  [name] Over the next 90 days, patient will continue to demonstrate  self management of COPD as evidenced by no acute exacerbations  . Over the next 15 days, patient will work with Consulting civil engineer to begin receiving [facility] through [place] again  Interventions:  . Discussed HPI with patient o Some wheezing  o Using albuterol as needed . Chart reviewed including recent office notes and medication orders . Medications reviewed and discussed  o Patient was receiving Dulera through [employer] PAP . Reviewed [employer] website and [name] is no longer listed on their PAP list . Collaborated with [name], PharmD to see  what the potential problem may be and if there are any treatment alternatives in the meantime . Patient can also reach out to [employer] at  [phone] for more information on current Rx and shipment . Encouraged patient to reach out to PCP with any new or worsening symptoms . Encouraged patient to reach out to RN care manager as needed  Patient Self Care Activities:  . Performs ADL's independently . Performs IADL's independently . Unable to independently N/A  Please see past updates related to this goal by clicking on the "Past Updates" button in the selected goal        Other   .  "I need to talk with someone about incontinence"        CARE PLAN ENTRY (see longitudinal plan of care for additional care plan information)  Current Barriers:  . Care Coordination needs related to new onset urge incontinence in a patient with CKD  Nurse Case Manager Clinical Goal(s):  [name] Over the next 30 days, patient will f/u with PCP regarding any new, persistent, or worsening problems  Interventions:  . Inter-disciplinary care team collaboration (see longitudinal plan of care) . Talked with patient by telephone . Discussed urge incontinence, frequency, and noctura . Reviewed and discussed medications o Flomax and Myrbetriq . Discussed condition since starting Myrbetriq  o Patient has seen some improvement. Only up to urinate twice last night.  . Encouraged patient to reach out to PCP with any new or worsening symptoms . Encouraged to keep all medical appointments . Encouraged patient to reach out to [facility] Manager as needed  Patient Self Care Activities:  . Performs ADL's independently . Performs IADL's independently  Please see past updates related to this goal by clicking on the "Past Updates" button in the selected goal          Plan:   The care management team will reach out to the patient again over the next 7 days.    [name], BSN, RN-BC Embedded Chronic Care  Manager [facility] / [place] Management Direct Dial: [phone]

## 2019-12-14 NOTE — Patient Instructions (Signed)
Visit Information  Goals Addressed              This Visit's Progress     Patient Stated   .  COPD Symptom Management (pt-stated)        Current Barriers:  . Chronic Disease Management support and education needs related to COPD & shortness of breath  Nurse Case Manager Clinical Goal(s):  [name] Over the next 90 days, patient will continue to demonstrate self management of COPD as evidenced by no acute exacerbations  . Over the next 15 days, patient will work with Consulting civil engineer to begin receiving [facility] through [place] again  Interventions:  . Discussed HPI with patient o Some wheezing  o Using albuterol as needed . Chart reviewed including recent office notes and medication orders . Medications reviewed and discussed  o Patient was receiving Dulera through [employer] PAP . Reviewed [employer] website and [name] is no longer listed on their PAP list . Collaborated with [name], PharmD to see what the potential problem may be and if there are any treatment alternatives in the meantime . Patient can also reach out to [employer] at  [phone] for more information on current Rx and shipment . Encouraged patient to reach out to PCP with any new or worsening symptoms . Encouraged patient to reach out to RN care manager as needed  Patient Self Care Activities:  . Performs ADL's independently . Performs IADL's independently . Unable to independently N/A  Please see past updates related to this goal by clicking on the "Past Updates" button in the selected goal        Other   .  "I need to talk with someone about incontinence"        CARE PLAN ENTRY (see longitudinal plan of care for additional care plan information)  Current Barriers:  . Care Coordination needs related to new onset urge incontinence in a patient with CKD  Nurse Case Manager Clinical Goal(s):  [name] Over the next 30 days, patient will f/u with PCP regarding any new, persistent, or worsening  problems  Interventions:  . Inter-disciplinary care team collaboration (see longitudinal plan of care) . Talked with patient by telephone . Discussed urge incontinence, frequency, and noctura . Reviewed and discussed medications o Flomax and Myrbetriq . Discussed condition since starting Myrbetriq  o Patient has seen some improvement. Only up to urinate twice last night.  . Encouraged patient to reach out to PCP with any new or worsening symptoms . Encouraged to keep all medical appointments . Encouraged patient to reach out to [facility] Manager as needed  Patient Self Care Activities:  . Performs ADL's independently . Performs IADL's independently  Please see past updates related to this goal by clicking on the "Past Updates" button in the selected goal         Patient verbalizes understanding of instructions provided today.   Follow-up Plan The care management team will reach out to the patient again over the next 7 days.   [name], BSN, RN-BC Embedded Chronic Care Manager [facility] / [place] Management Direct Dial: [phone]

## 2019-12-17 ENCOUNTER — Ambulatory Visit: Payer: PPO | Admitting: *Deleted

## 2019-12-17 ENCOUNTER — Other Ambulatory Visit (HOSPITAL_COMMUNITY)
Admission: RE | Admit: 2019-12-17 | Discharge: 2019-12-17 | Disposition: A | Discharge status: Home / Self Care | Payer: PPO | Source: Ambulatory Visit | Attending: Gastroenterology | Admitting: Gastroenterology

## 2019-12-17 DIAGNOSIS — Z20822 Contact with and (suspected) exposure to covid-19: Secondary | ICD-10-CM | POA: Diagnosis not present

## 2019-12-17 DIAGNOSIS — Z01812 Encounter for preprocedural laboratory examination: Secondary | ICD-10-CM | POA: Diagnosis not present

## 2019-12-17 DIAGNOSIS — J441 Chronic obstructive pulmonary disease with (acute) exacerbation: Secondary | ICD-10-CM

## 2019-12-17 DIAGNOSIS — I5042 Chronic combined systolic (congestive) and diastolic (congestive) heart failure: Secondary | ICD-10-CM

## 2019-12-17 LAB — SARS CORONAVIRUS 2 (TAT 6-24 HRS): SARS Coronavirus 2: NEGATIVE

## 2019-12-17 NOTE — Chronic Care Management (AMB) (Signed)
Chronic Care Management   Follow Up Note   12/17/2019 Name: Kenneth Fuller MRN: [id number] DOB: 1946/02/05  Referred by: [name], [name], MD Reason for referral : Chronic Care Management (RN follow-up on Rx Assistance)   Kenneth Fuller is a 74 y.o. year old male who is a primary care patient of [name], [name], MD. The CCM team was consulted for assistance with chronic disease management and care coordination needs.    Review of patient status, including review of consultants reports, relevant laboratory and other test results, and collaboration with appropriate care team members and the patient's provider was performed as part of comprehensive patient evaluation and provision of chronic care management services.    SDOH (Social Determinants of Health) assessments performed: No See Care Plan activities for detailed interventions related to [facility])     Outpatient Encounter Medications as of 12/17/2019  Medication Sig Note   albuterol (ACCUNEB) 1.25 MG/3ML nebulizer solution Take 1 ampule by nebulization every 6 (six) hours as needed for wheezing.    albuterol (PROVENTIL HFA;VENTOLIN HFA) 108 (90 Base) MCG/ACT inhaler Inhale 2 puffs into the lungs every 6 (six) hours as needed for wheezing or shortness of breath.    atorvastatin (LIPITOR) 40 MG tablet Take 1 tablet by mouth once daily (Patient taking differently: Take 40 mg by mouth every evening. )    azelastine (OPTIVAR) 0.05 % ophthalmic solution Place 1 drop into both eyes in the morning and at bedtime.    bisoprolol (ZEBETA) 5 MG tablet Take 1/2 (one-half) tablet by mouth once daily (Patient taking differently: Take 2.5 mg by mouth daily. )    Cholecalciferol (VITAMIN D3) 125 MCG (5000 UT) TABS Take 5,000 Units by mouth daily.    colchicine 0.6 MG tablet Take 1 tablet (0.6 mg total) by mouth daily. (Patient taking differently: Take 0.6 mg by mouth daily as needed (gout). )    dapagliflozin propanediol (FARXIGA) 5  MG TABS tablet Take 5 mg by mouth daily.    FEROSUL 325 (65 Fe) MG tablet Take 325 mg by mouth 3 (three) times daily.     furosemide (LASIX) 40 MG tablet Take 40 mg by mouth daily as needed (fluid retention.).    hydrALAZINE (APRESOLINE) 50 MG tablet Take 1 tablet (50 mg total) by mouth 3 (three) times daily.    isosorbide dinitrate (ISORDIL) 10 MG tablet TAKE 1 TABLET BY MOUTH THREE TIMES DAILY (Patient taking differently: Take 10 mg by mouth 3 (three) times daily. )    mirabegron ER (MYRBETRIQ) 25 MG TB24 tablet Take 1 tablet (25 mg total) by mouth daily.    mometasone-formoterol (DULERA) 100-5 MCG/ACT AERO Inhale 2 puffs into the lungs 2 (two) times daily. 05/17/2019: [employer] Patient assistance   Na Sulfate-K Sulfate-Mg Sulf (SUPREP BOWEL PREP KIT) 17.5-3.13-1.6 GM/177ML SOLN Take 1 kit by mouth as directed.    omeprazole (PRILOSEC) 40 MG capsule Take 1 capsule (40 mg total) by mouth daily. Take 30-60 minutes prior to breakfast.    polyethylene glycol (MIRALAX / GLYCOLAX) 17 g packet Take 17 g by mouth daily. (Patient taking differently: Take 17 g by mouth daily as needed (constipation.). )    prednisoLONE acetate (PRED FORTE) 1 % ophthalmic suspension Place 1 drop into both eyes in the morning and at bedtime.    RESTASIS 0.05 % ophthalmic emulsion Place 1 drop into both eyes in the morning and at bedtime.    sertraline (ZOLOFT) 100 MG tablet Take 1 tablet (100 mg total)  by mouth daily. (Patient taking differently: Take 100 mg by mouth at bedtime. )    tamsulosin (FLOMAX) 0.4 MG CAPS capsule Take 1 capsule (0.4 mg total) by mouth daily after supper.    No facility-administered encounter medications on file as of 12/17/2019.       Goals Addressed              This Visit's Progress     Patient Stated     COPD Symptom Management (pt-stated)        Current Barriers:   Chronic Disease Management support and education needs related to COPD & shortness of breath  Nurse Case  Manager Clinical Goal(s):   Over the next 90 days, patient will continue to demonstrate self management of COPD as evidenced by no acute exacerbations   Over the next 15 days, patient will work with Consulting civil engineer to begin receiving Dulera through Prescription Assistance Program again  Interventions:   Previously discussed HPI with patient o Some wheezing  o Using albuterol as needed  Chart reviewed including recent office notes and medication orders  Medications reviewed and discussed  o Patient was receiving Dulera through [employer] PAP but has not received shipment  Previously reviewed [employer] and [name] is no longer listed on their PAP list  Collaborated with [name], PharmD to see what the potential problem may be and if there are any treatment alternatives in the meantime o [employer] no longer offering Dulera through PAP. Pt should have received a letter with directions to call them for more info o Can switch to Symbicort and apply for PAP next year o Also may be able to provide samples of similar meds in meantime if necessary  Asked patient to reach out to [employer] at  [phone] for more information on current Rx and shipment  Encouraged patient to reach out to PCP with any new or worsening symptoms  Encouraged patient to reach out to RN care manager as needed  Patient Self Care Activities:   Performs ADL's independently  Performs IADL's independently  Unable to independently N/A  Please see past updates related to this goal by clicking on the "Past Updates" button in the selected goal          Plan:   The care management team will reach out to the patient again over the next 30 days.  The patient will call [phone]* as advised to discuss PAP and Dulera with [employer].  Patient will reach out to [facility] if inhaler samples are needed   Thereasa Parkin y, BSN, RN-BC [place] / [place] Management Direct  Dial: [phone]

## 2019-12-17 NOTE — Patient Instructions (Signed)
Visit Information  Goals Addressed              This Visit's Progress     Patient Stated   .  COPD Symptom Management (pt-stated)        Current Barriers:  . Chronic Disease Management support and education needs related to COPD & shortness of breath  Nurse Case Manager Clinical Goal(s):  [name] Over the next 90 days, patient will continue to demonstrate self management of COPD as evidenced by no acute exacerbations  . Over the next 15 days, patient will work with Consulting civil engineer to begin receiving [facility] through [place] again  Interventions:  . Previously discussed HPI with patient o Some wheezing  o Using albuterol as needed . Chart reviewed including recent office notes and medication orders . Medications reviewed and discussed  o Patient was receiving Dulera through [employer] PAP but has not received shipment . Previously reviewed [employer] and [name] is no longer listed on their PAP list . Collaborated with [name], PharmD to see what the potential problem may be and if there are any treatment alternatives in the meantime o [employer] no longer offering Dulera through PAP. Pt should have received a letter with directions to call them for more info o Can switch to Symbicort and apply for PAP next year o Also may be able to provide samples of similar meds in meantime if necessary . Asked patient to reach out to [employer] at  [phone] for more information on current Rx and shipment . Encouraged patient to reach out to PCP with any new or worsening symptoms . Encouraged patient to reach out to RN care manager as needed  Patient Self Care Activities:  . Performs ADL's independently . Performs IADL's independently . Unable to independently N/A  Please see past updates related to this goal by clicking on the "Past Updates" button in the selected goal         Patient verbalizes understanding of instructions provided today.   Follow-up Plan:   The care  management team will reach out to the patient again over the next 30 days.  The patient will call [phone]* as advised to discuss PAP and Dulera with [employer].  Patient will reach out to [facility] if inhaler samples are needed   [name], BSN, RN-BC [place] / [place] Management Direct Dial: [phone]

## 2019-12-20 ENCOUNTER — Ambulatory Visit (HOSPITAL_COMMUNITY): Payer: PPO | Admitting: Anesthesiology

## 2019-12-20 ENCOUNTER — Ambulatory Visit (HOSPITAL_COMMUNITY)
Admission: RE | Admit: 2019-12-20 | Discharge: 2019-12-20 | Disposition: A | Discharge status: Home / Self Care | Payer: PPO | Attending: Gastroenterology | Admitting: Gastroenterology

## 2019-12-20 ENCOUNTER — Encounter (HOSPITAL_COMMUNITY): Payer: Self-pay | Admitting: Gastroenterology

## 2019-12-20 ENCOUNTER — Other Ambulatory Visit: Payer: Self-pay

## 2019-12-20 ENCOUNTER — Encounter (HOSPITAL_COMMUNITY)
Admission: RE | Disposition: A | Discharge status: Home / Self Care | Payer: Self-pay | Source: Home / Self Care | Attending: Gastroenterology

## 2019-12-20 DIAGNOSIS — Z7984 Long term (current) use of oral hypoglycemic drugs: Secondary | ICD-10-CM | POA: Insufficient documentation

## 2019-12-20 DIAGNOSIS — D509 Iron deficiency anemia, unspecified: Secondary | ICD-10-CM

## 2019-12-20 DIAGNOSIS — K219 Gastro-esophageal reflux disease without esophagitis: Secondary | ICD-10-CM | POA: Diagnosis not present

## 2019-12-20 DIAGNOSIS — R1310 Dysphagia, unspecified: Secondary | ICD-10-CM

## 2019-12-20 DIAGNOSIS — K3189 Other diseases of stomach and duodenum: Secondary | ICD-10-CM | POA: Diagnosis not present

## 2019-12-20 DIAGNOSIS — K635 Polyp of colon: Secondary | ICD-10-CM | POA: Diagnosis not present

## 2019-12-20 DIAGNOSIS — Z87891 Personal history of nicotine dependence: Secondary | ICD-10-CM | POA: Insufficient documentation

## 2019-12-20 DIAGNOSIS — K319 Disease of stomach and duodenum, unspecified: Secondary | ICD-10-CM | POA: Diagnosis not present

## 2019-12-20 DIAGNOSIS — Z86010 Personal history of colonic polyps: Secondary | ICD-10-CM | POA: Diagnosis not present

## 2019-12-20 DIAGNOSIS — R1013 Epigastric pain: Secondary | ICD-10-CM

## 2019-12-20 DIAGNOSIS — Z1211 Encounter for screening for malignant neoplasm of colon: Secondary | ICD-10-CM | POA: Insufficient documentation

## 2019-12-20 DIAGNOSIS — Z800 Family history of malignant neoplasm of digestive organs: Secondary | ICD-10-CM | POA: Insufficient documentation

## 2019-12-20 DIAGNOSIS — Z7951 Long term (current) use of inhaled steroids: Secondary | ICD-10-CM | POA: Insufficient documentation

## 2019-12-20 DIAGNOSIS — K2970 Gastritis, unspecified, without bleeding: Secondary | ICD-10-CM

## 2019-12-20 DIAGNOSIS — Z79899 Other long term (current) drug therapy: Secondary | ICD-10-CM | POA: Insufficient documentation

## 2019-12-20 DIAGNOSIS — K648 Other hemorrhoids: Secondary | ICD-10-CM | POA: Insufficient documentation

## 2019-12-20 DIAGNOSIS — K5730 Diverticulosis of large intestine without perforation or abscess without bleeding: Secondary | ICD-10-CM | POA: Diagnosis not present

## 2019-12-20 HISTORY — PX: ESOPHAGOGASTRODUODENOSCOPY (EGD) WITH PROPOFOL: SHX5813

## 2019-12-20 HISTORY — PX: BIOPSY: SHX5522

## 2019-12-20 HISTORY — PX: COLONOSCOPY WITH PROPOFOL: SHX5780

## 2019-12-20 HISTORY — PX: POLYPECTOMY: SHX5525

## 2019-12-20 LAB — GLUCOSE, CAPILLARY: Glucose-Capillary: 93 mg/dL (ref 70–99)

## 2019-12-20 SURGERY — ESOPHAGOGASTRODUODENOSCOPY (EGD) WITH PROPOFOL
Anesthesia: Monitor Anesthesia Care

## 2019-12-20 MED ORDER — PROPOFOL 1000 MG/100ML IV EMUL
INTRAVENOUS | Status: AC
  Filled 2019-12-20: qty 100

## 2019-12-20 MED ORDER — PROPOFOL 500 MG/50ML IV EMUL
INTRAVENOUS | Status: DC | PRN
  Administered 2019-12-20: 125 ug/kg/min via INTRAVENOUS

## 2019-12-20 MED ORDER — LIDOCAINE HCL (CARDIAC) PF 100 MG/5ML IV SOSY
PREFILLED_SYRINGE | INTRAVENOUS | Status: DC | PRN
  Administered 2019-12-20: 80 mg via INTRAVENOUS

## 2019-12-20 MED ORDER — PROPOFOL 10 MG/ML IV BOLUS
INTRAVENOUS | Status: AC
  Filled 2019-12-20: qty 20

## 2019-12-20 MED ORDER — LACTATED RINGERS IV SOLN: INTRAVENOUS | Status: DC

## 2019-12-20 MED ORDER — PROPOFOL 10 MG/ML IV BOLUS
INTRAVENOUS | Status: DC | PRN
  Administered 2019-12-20: 50 mg via INTRAVENOUS

## 2019-12-20 MED ORDER — SODIUM CHLORIDE 0.9 % IV SOLN: INTRAVENOUS | Status: DC

## 2019-12-20 SURGICAL SUPPLY — 24 items

## 2019-12-20 NOTE — Anesthesia Postprocedure Evaluation (Signed)
Anesthesia Post Note  Patient: Kenneth Fuller  Procedure(s) Performed: ESOPHAGOGASTRODUODENOSCOPY (EGD) WITH PROPOFOL (N/A ) COLONOSCOPY WITH PROPOFOL (N/A ) BIOPSY POLYPECTOMY     Patient location during evaluation: PACU Anesthesia Type: MAC Level of consciousness: awake and alert Pain management: pain level controlled Vital Signs Assessment: post-procedure vital signs reviewed and stable Respiratory status: spontaneous breathing, nonlabored ventilation and respiratory function stable Cardiovascular status: stable and blood pressure returned to baseline Anesthetic complications: no   No complications documented.  Last Vitals:  Vitals:   12/20/19 1113 12/20/19 1130  BP: (!) 145/71 (!) 164/86  Pulse: 78 70  Resp: 19 18  Temp: 36.7 C   SpO2: 100% 98%    Last Pain:  Vitals:   12/20/19 1130  TempSrc:   PainSc: 0-No pain                 Audry Pili

## 2019-12-20 NOTE — Transfer of Care (Signed)
Immediate Anesthesia Transfer of Care Note  Patient: Kenneth Fuller  Procedure(s) Performed: ESOPHAGOGASTRODUODENOSCOPY (EGD) WITH PROPOFOL (N/A ) COLONOSCOPY WITH PROPOFOL (N/A ) BIOPSY POLYPECTOMY  Patient Location: Endoscopy Unit  Anesthesia Type:MAC  Level of Consciousness: awake, drowsy and patient cooperative  Airway & Oxygen Therapy: Patient Spontanous Breathing and Patient connected to face mask oxygen  Post-op Assessment: Report given to RN and Post -op Vital signs reviewed and stable  Post vital signs: Reviewed and stable  Last Vitals:  Vitals Value Taken Time  BP 145/71   Temp    Pulse 78 12/20/19 1113  Resp 19 12/20/19 1113  SpO2 100 % 12/20/19 1113  Vitals shown include unvalidated device data.  Last Pain:  Vitals:   12/20/19 0957  TempSrc: Oral  PainSc: 0-No pain         Complications: No complications documented.

## 2019-12-20 NOTE — Op Note (Signed)
[facility] Patient Name: Kenneth Fuller Procedure Date: 12/20/2019 MRN: [id number] Attending MD: Milus Banister , MD Date of Birth: [birth year] CSN: [id number] Age: 74 Admit Type: Outpatient Procedure:                Colonoscopy Indications:              High risk colon cancer surveillance: Personal                            history of colonic polyps. Slightly anemic                            recently. Adenomatous polyp 2008, small; repeat                            colonoscopy 01/2011 for minor rectal bleeding found                            small HP, hemorrhoids;[name] Colonoscopy June 2017 for                            dark stools Dr. Ardis Hughs found 3 subcentimeter                            polyps. The examination was otherwise normal.                            Pathology showed these were adenomatous polyps Providers:                Milus Banister, MD, Cleda Daub, RN, Laverda Sorenson, Technician, Benetta Spar, Technician,                            Hedy Camara Referring MD:              Medicines:                Monitored Anesthesia Care Complications:            No immediate complications. Estimated blood loss:                            None. Estimated Blood Loss:     Estimated blood loss: none. Procedure:                Pre-Anesthesia Assessment:                           - Prior to the procedure, a History and Physical                            was performed, and patient medications and                            allergies were reviewed. The patient's tolerance of  previous anesthesia was also reviewed. The risks                            and benefits of the procedure and the sedation                            options and risks were discussed with the patient.                            All questions were answered, and informed consent                            was obtained. Prior  Anticoagulants: The patient has                            taken no previous anticoagulant or antiplatelet                            agents. ASA Grade Assessment: IV - A patient with                            severe systemic disease that is a constant threat                            to life. After reviewing the risks and benefits,                            the patient was deemed in satisfactory condition to                            undergo the procedure.                           After obtaining informed consent, the colonoscope                            was passed under direct vision. Throughout the                            procedure, the patient's blood pressure, pulse, and                            oxygen saturations were monitored continuously. The                            CF-HQ190L ([id number]) Olympus colonoscope was                            introduced through the anus and advanced to the the                            cecum, identified by appendiceal orifice and  ileocecal valve. The colonoscopy was performed                            without difficulty. The patient tolerated the                            procedure well. The quality of the bowel                            preparation was good. The ileocecal valve,                            appendiceal orifice, and rectum were photographed. Scope In: 10:41:17 AM Scope Out: 10:52:11 AM Scope Withdrawal Time: 0 hours 8 minutes 34 seconds  Total Procedure Duration: 0 hours 10 minutes 54 seconds  Findings:      Two sessile polyps were found in the transverse colon. The polyps were 2       to 3 mm in size. These polyps were removed with a cold snare. Resection       and retrieval were complete.      A few small-mouthed diverticula were found in the entire colon.      Internal hemorrhoids were found. The hemorrhoids were small.      The exam was otherwise without abnormality on direct and  retroflexion       views. Impression:               - Two 2 to 3 mm polyps in the transverse colon,                            removed with a cold snare. Resected and retrieved.                           - Diverticulosis in the entire examined colon.                           - Internal hemorrhoids.                           - The examination was otherwise normal on direct                            and retroflexion views. Moderate Sedation:      Not Applicable - Patient had care per Anesthesia. Recommendation:           - EGD now.                           - Await pathology results. Procedure Code(s):        --- Professional ---                           [phone], Colonoscopy, flexible; with removal of                            tumor(s), polyp(s), or other lesion(s) by snare  technique Diagnosis Code(s):        --- Professional ---                           Z86.010, Personal history of colonic polyps                           K63.5, Polyp of colon                           K64.8, Other hemorrhoids                           K57.30, Diverticulosis of large intestine without                            perforation or abscess without bleeding CPT copyright 2019 American Medical Association. All rights reserved. The codes documented in this report are preliminary and upon coder review may  be revised to meet current compliance requirements. Milus Banister, MD 12/20/2019 11:10:08 AM This report has been signed electronically. Number of Addenda: 0

## 2019-12-20 NOTE — Anesthesia Preprocedure Evaluation (Addendum)
Anesthesia Evaluation  Patient identified by MRN, date of birth, ID band Patient awake    Reviewed: Allergy & Precautions, NPO status , Patient's Chart, lab work & pertinent test results  History of Anesthesia Complications Negative for: history of anesthetic complications  Airway Mallampati: II  TM Distance: >3 FB Neck ROM: Full    Dental  (+) Edentulous Upper, Edentulous Lower   Pulmonary asthma , sleep apnea , COPD,  COPD inhaler, former smoker,    Pulmonary exam normal        Cardiovascular hypertension, Pt. on medications Normal cardiovascular exam   '20 TTE - EF50-55%. LV cavity size was moderately dilated. There is mild concentric left ventricular hypertrophy. Diastolic dysfunction, grade indeterminate. Trivial MR, mild PR.  '18 Cath - Mid LAD lesion, 10 %stenosed. Mid RCA lesion, 10 %stenosed. The left ventricular systolic function is normal. LV end diastolic pressure is mildly elevated. The left ventricular ejection fraction is 55-65% by visual estimate. There is no aortic valve stenosis.     Neuro/Psych  Headaches, PSYCHIATRIC DISORDERS Anxiety Depression    GI/Hepatic Neg liver ROS, GERD  Medicated and Controlled,  Endo/Other  diabetes, Type 2, Oral Hypoglycemic AgentsMorbid obesity  Renal/GU Renal disease     Musculoskeletal  (+) Fibromyalgia - Gout    Abdominal   Peds  Hematology negative hematology ROS (+)   Anesthesia Other Findings Covid test negative   Reproductive/Obstetrics                            Anesthesia Physical Anesthesia Plan  ASA: III  Anesthesia Plan: MAC   Post-op Pain Management:    Induction: Intravenous  PONV Risk Score and Plan: 1 and Propofol infusion and Treatment may vary due to age or medical condition  Airway Management Planned: Nasal Cannula and Natural Airway  Additional Equipment: None  Intra-op Plan:   Post-operative  Plan:   Informed Consent: I have reviewed the patients History and Physical, chart, labs and discussed the procedure including the risks, benefits and alternatives for the proposed anesthesia with the patient or authorized representative who has indicated his/her understanding and acceptance.       Plan Discussed with: CRNA and Anesthesiologist  Anesthesia Plan Comments:        Anesthesia Quick Evaluation

## 2019-12-20 NOTE — Op Note (Signed)
[facility] Patient Name: Kenneth Fuller Procedure Date: 12/20/2019 MRN: [id number] Attending MD: Milus Banister , MD Date of Birth: 1946-01-07 CSN: [id number] Age: 74 Admit Type: Outpatient Procedure:                Upper GI endoscopy Indications:              mild IDA, dark stools several weeks ago,                            intermittent dysphagi Providers:                Milus Banister, MD, Cleda Daub, RN, Laverda Sorenson, Technician, Benetta Spar, Technician,                            Hedy Camara Referring MD:              Medicines:                Monitored Anesthesia Care Complications:            No immediate complications. Estimated blood loss:                            None. Estimated Blood Loss:     Estimated blood loss: none. Procedure:                Pre-Anesthesia Assessment:                           - Prior to the procedure, a History and Physical                            was performed, and patient medications and                            allergies were reviewed. The patient's tolerance of                            previous anesthesia was also reviewed. The risks                            and benefits of the procedure and the sedation                            options and risks were discussed with the patient.                            All questions were answered, and informed consent                            was obtained. Prior Anticoagulants: The patient has                            taken no previous anticoagulant or  antiplatelet                            agents. ASA Grade Assessment: IV - A patient with                            severe systemic disease that is a constant threat                            to life. After reviewing the risks and benefits,                            the patient was deemed in satisfactory condition to                            undergo the procedure.                            After obtaining informed consent, the endoscope was                            passed under direct vision. Throughout the                            procedure, the patient's blood pressure, pulse, and                            oxygen saturations were monitored continuously. The                            [id number]-H190 ([id number]) Olympus gastroscope was                            introduced through the mouth, and advanced to the                            second part of duodenum. The upper GI endoscopy was                            accomplished without difficulty. The patient                            tolerated the procedure well. Scope In: Scope Out: Findings:      Mild non-specific inflammation characterized by erythema and friability       was found in the gastric antrum. Biopsies were taken with a cold forceps       for histology. jar 1      Focally very friable gastric mucosa in proximal stomach associated with       erosion, eschar? Similar in appearance to Cameron's erosion however no       hiatal hernia present. This was biospied. jar 2.      The exam was otherwise without abnormality. Impression:               - Mild non-specific inflammation characterized by  erythema and friability was found in the gastric                            antrum. Biopsies were taken with a cold forceps for                            histology. jar 1                           - Focally very friable gastric mucosa in proximal                            stomach associated with erosion, eschar? Similar in                            appearance to Cameron's erosion however no hiatal                            hernia present. This was biospied. jar 2.                           - The examination was otherwise normal. Moderate Sedation:      Not Applicable - Patient had care per Anesthesia. Recommendation:           - Patient has a contact number available for                             emergencies. The signs and symptoms of potential                            delayed complications were discussed with the                            patient. Return to normal activities tomorrow.                            Written discharge instructions were provided to the                            patient.                           - Resume previous diet.                           - Continue present medications.                           - Await pathology results. Procedure Code(s):        --- Professional ---                           [phone], Esophagogastroduodenoscopy, flexible,                            transoral; with biopsy,  single or multiple Diagnosis Code(s):        --- Professional ---                           K29.70, Gastritis, unspecified, without bleeding                           D50.9, Iron deficiency anemia, unspecified CPT copyright 2019 American Medical Association. All rights reserved. The codes documented in this report are preliminary and upon coder review may  be revised to meet current compliance requirements. Milus Banister, MD 12/20/2019 11:24:49 AM This report has been signed electronically. Number of Addenda: 0

## 2019-12-20 NOTE — Discharge Instructions (Signed)
YOU HAD AN ENDOSCOPIC PROCEDURE TODAY: Refer to the procedure report and other information in the discharge instructions given to you for any specific questions about what was found during the examination. If this information does not answer your questions, please call [place] office at [phone] to clarify.   YOU SHOULD EXPECT: Some feelings of bloating in the abdomen. Passage of more gas than usual. Walking can help get rid of the air that was put into your GI tract during the procedure and reduce the bloating. If you had a lower endoscopy (such as a colonoscopy or flexible sigmoidoscopy) you may notice spotting of blood in your stool or on the toilet paper. Some abdominal soreness may be present for a day or two, also.  DIET: Your first meal following the procedure should be a light meal and then it is ok to progress to your normal diet. A half-sandwich or bowl of soup is an example of a good first meal. Heavy or fried foods are harder to digest and may make you feel nauseous or bloated. Drink plenty of fluids but you should avoid alcoholic beverages for 24 hours. If you had a esophageal dilation, please see attached instructions for diet.    ACTIVITY: Your care partner should take you home directly after the procedure. You should plan to take it easy, moving slowly for the rest of the day. You can resume normal activity the day after the procedure however YOU SHOULD NOT DRIVE, use power tools, machinery or perform tasks that involve climbing or major physical exertion for 24 hours (because of the sedation medicines used during the test).   SYMPTOMS TO REPORT IMMEDIATELY: A gastroenterologist can be reached at any hour. Please call [phone]  for any of the following symptoms:  . Following lower endoscopy (colonoscopy, flexible sigmoidoscopy) Excessive amounts of blood in the stool  Significant tenderness, worsening of abdominal pains  Swelling of the abdomen that is new, acute  Fever of 100  or higher  . Following upper endoscopy (EGD, EUS, ERCP, esophageal dilation) Vomiting of blood or coffee ground material  New, significant abdominal pain  New, significant chest pain or pain under the shoulder blades  Painful or persistently difficult swallowing  New shortness of breath  Black, tarry-looking or red, bloody stools  FOLLOW UP:  If any biopsies were taken you will be contacted by phone or by letter within the next 1-3 weeks. Call [phone]  if you have not heard about the biopsies in 3 weeks.  Please also call with any specific questions about appointments or follow up tests.

## 2019-12-20 NOTE — Interval H&P Note (Signed)
History and Physical Interval Note:  12/20/2019 9:45 AM  Kenneth Fuller  has presented today for surgery, with the diagnosis of family history of colon cancer, gerd, dysphagia, IDA.  The various methods of treatment have been discussed with the patient and family. After consideration of risks, benefits and other options for treatment, the patient has consented to  Procedure(s): ESOPHAGOGASTRODUODENOSCOPY (EGD) WITH PROPOFOL (N/A) COLONOSCOPY WITH PROPOFOL (N/A) as a surgical intervention.  The patient's history has been reviewed, patient examined, no change in status, stable for surgery.  I have reviewed the patient's chart and labs.  Questions were answered to the patient's satisfaction.     Milus Banister

## 2019-12-21 ENCOUNTER — Other Ambulatory Visit: Payer: Self-pay

## 2019-12-21 DIAGNOSIS — E611 Iron deficiency: Secondary | ICD-10-CM | POA: Diagnosis not present

## 2019-12-21 DIAGNOSIS — E1122 Type 2 diabetes mellitus with diabetic chronic kidney disease: Secondary | ICD-10-CM | POA: Diagnosis not present

## 2019-12-21 DIAGNOSIS — N189 Chronic kidney disease, unspecified: Secondary | ICD-10-CM | POA: Diagnosis not present

## 2019-12-21 DIAGNOSIS — I5042 Chronic combined systolic (congestive) and diastolic (congestive) heart failure: Secondary | ICD-10-CM | POA: Diagnosis not present

## 2019-12-21 DIAGNOSIS — I129 Hypertensive chronic kidney disease with stage 1 through stage 4 chronic kidney disease, or unspecified chronic kidney disease: Secondary | ICD-10-CM | POA: Diagnosis not present

## 2019-12-24 ENCOUNTER — Encounter (HOSPITAL_COMMUNITY): Payer: Self-pay | Admitting: Gastroenterology

## 2019-12-24 LAB — SURGICAL PATHOLOGY

## 2019-12-26 ENCOUNTER — Ambulatory Visit: Payer: Self-pay | Admitting: Pharmacist

## 2019-12-26 DIAGNOSIS — E1142 Type 2 diabetes mellitus with diabetic polyneuropathy: Secondary | ICD-10-CM

## 2019-12-26 DIAGNOSIS — J441 Chronic obstructive pulmonary disease with (acute) exacerbation: Secondary | ICD-10-CM

## 2019-12-27 ENCOUNTER — Other Ambulatory Visit: Payer: Self-pay

## 2019-12-27 MED ORDER — OMEPRAZOLE 40 MG PO CPDR: 40.0000 mg | DELAYED_RELEASE_CAPSULE | Freq: Every day | ORAL | 3 refills | Status: DC

## 2020-01-02 ENCOUNTER — Ambulatory Visit: Payer: PPO | Admitting: Licensed Clinical Social Worker

## 2020-01-02 ENCOUNTER — Telehealth: Payer: Self-pay | Admitting: Pharmacist

## 2020-01-02 DIAGNOSIS — F339 Major depressive disorder, recurrent, unspecified: Secondary | ICD-10-CM

## 2020-01-02 DIAGNOSIS — I2510 Atherosclerotic heart disease of native coronary artery without angina pectoris: Secondary | ICD-10-CM

## 2020-01-02 DIAGNOSIS — E1142 Type 2 diabetes mellitus with diabetic polyneuropathy: Secondary | ICD-10-CM

## 2020-01-02 DIAGNOSIS — J449 Chronic obstructive pulmonary disease, unspecified: Secondary | ICD-10-CM

## 2020-01-02 DIAGNOSIS — I5042 Chronic combined systolic (congestive) and diastolic (congestive) heart failure: Secondary | ICD-10-CM

## 2020-01-02 DIAGNOSIS — N183 Chronic kidney disease, stage 3 unspecified: Secondary | ICD-10-CM

## 2020-01-02 MED ORDER — DAPAGLIFLOZIN PROPANEDIOL 5 MG PO TABS
5.0000 mg | ORAL_TABLET | Freq: Every day | ORAL | 3 refills | Status: DC
Start: 2020-01-02 — End: 2020-10-30

## 2020-01-02 MED ORDER — BUDESONIDE-FORMOTEROL FUMARATE 160-4.5 MCG/ACT IN AERO: 2.0000 | INHALATION_SPRAY | Freq: Two times a day (BID) | RESPIRATORY_TRACT | 11 refills | Status: DC

## 2020-01-02 NOTE — Telephone Encounter (Signed)
re[place]rolled [place] (symbicort/farxiga)   Patient re-enrolled in [employer] patient assistance program for farxiga/symbicort for 7414 Application filled out online Medication will ship to patient's home RX escribed to [facility] with 3 refills

## 2020-01-02 NOTE — Patient Instructions (Addendum)
Licensed Clinical Social Worker Visit Information  Goals we discussed today:  .  Client has stress related to ongoing health issues and wishes to talk more about stress over current health issues (pt-stated)        Current Barriers:   Mental Health Concerns  in patient with Chronic Diagnoses of CAD, DM Type II, Depression,recurrent, CKD, COPD and CHF   Challenges related to managing health issues of client   Clinical Social Work Clinical Goal(s):   Over the next 30 days, client will work with LCSW to address concerns related to health issues of client and client management of health issues faced  Interventions:  LCSW talked with client about current client needs  Talked with client about client recent Colonoscopy  Talked with client about ADLs completion  Talked with client about client occasional shortness of breath  Talked with client about transport needs of client  Talked with client about upcoming client appointments  Talked with client about medication procurement of client  Patient Self Care Activities:   Self administers medications as prescribed  Attends all scheduled provider appointments  Performs ADL's independently   Plan:  LCSW to call client in next 4 weeks to talk with client about stress issues related to his management of health issues faced Client to communicate with RNCM to discuss nursing needs of client Client to attend scheduled client medical appointments   Initial goal documentation    Follow Up Plan: LCSW to call client in next 4 weeks to talk with him about stress issues related to management of health issues faced  Materials Provided: No  The patient verbalized understanding of instructions provided today and declined a print copy of patient instruction materials.   [name].Bradly Sangiovanni MSW, LCSW Licensed Clinical Social Worker [facility] [phone]

## 2020-01-02 NOTE — Chronic Care Management (AMB) (Signed)
Chronic Care Management    Clinical Social Work Follow Up Note  01/02/2020 Name: Kenneth Fuller MRN: [id number] DOB: 09-04-1945  Kenneth Fuller is a 74 y.o. year old male who is a primary care patient of [name], [name], MD. The CCM team was consulted for assistance with [name] .   Review of patient status, including review of consultants reports, other relevant assessments, and collaboration with appropriate care team members and the patient's provider was performed as part of comprehensive patient evaluation and provision of chronic care management services.    SDOH (Social Determinants of Health) assessments performed: No; risk for depression; risk for physical inactivity;risk for tobacco use; risk for stress; risk for financial strain    Office Visit from 05/28/2019 in [place]  PHQ-9 Total Score 10     GAD 7 : Generalized Anxiety Score 05/28/2019 11/11/2017  Nervous, Anxious, on Edge 0 3  Control/stop worrying 2 3  Worry too much - different things 2 3  Trouble relaxing 2 3  Restless 0 3  Easily annoyed or irritable 2 3  Afraid - awful might happen 0 0  Total GAD 7 Score 8 18  Anxiety Difficulty Somewhat difficult Somewhat difficult    Outpatient Encounter Medications as of 01/02/2020  Medication Sig  . albuterol (ACCUNEB) 1.25 MG/3ML nebulizer solution Take 1 ampule by nebulization every 6 (six) hours as needed for wheezing.  [name] albuterol (PROVENTIL HFA;VENTOLIN HFA) 108 (90 Base) MCG/ACT inhaler Inhale 2 puffs into the lungs every 6 (six) hours as needed for wheezing or shortness of breath.  [name] atorvastatin (LIPITOR) 40 MG tablet Take 1 tablet by mouth once daily (Patient taking differently: Take 40 mg by mouth every evening. )  . azelastine (OPTIVAR) 0.05 % ophthalmic solution Place 1 drop into both eyes in the morning and at bedtime.  . bisoprolol (ZEBETA) 5 MG tablet Take 1/2 (one-half) tablet by mouth once daily (Patient  taking differently: Take 2.5 mg by mouth daily. )  . budesonide-formoterol (SYMBICORT) 160-4.5 MCG/ACT inhaler Inhale 2 puffs into the lungs 2 (two) times daily.  . Cholecalciferol (VITAMIN D3) 125 MCG (5000 UT) TABS Take 5,000 Units by mouth daily.  . colchicine 0.6 MG tablet Take 1 tablet (0.6 mg total) by mouth daily. (Patient taking differently: Take 0.6 mg by mouth daily as needed (gout). )  . dapagliflozin propanediol (FARXIGA) 5 MG TABS tablet Take 1 tablet (5 mg total) by mouth daily.  . FEROSUL 325 (65 Fe) MG tablet Take 325 mg by mouth 3 (three) times daily.   . furosemide (LASIX) 40 MG tablet Take 40 mg by mouth daily as needed (fluid retention.).  [name] hydrALAZINE (APRESOLINE) 50 MG tablet Take 1 tablet (50 mg total) by mouth 3 (three) times daily.  . isosorbide dinitrate (ISORDIL) 10 MG tablet TAKE 1 TABLET BY MOUTH THREE TIMES DAILY (Patient taking differently: Take 10 mg by mouth 3 (three) times daily. )  . mirabegron ER (MYRBETRIQ) 25 MG TB24 tablet Take 1 tablet (25 mg total) by mouth daily.  . Na Sulfate-K Sulfate-Mg Sulf (SUPREP BOWEL PREP KIT) 17.5-3.13-1.6 GM/177ML SOLN Take 1 kit by mouth as directed.  [name] omeprazole (PRILOSEC) 40 MG capsule Take 1 capsule (40 mg total) by mouth daily. Take 30-60 minutes prior to breakfast.  . omeprazole (PRILOSEC) 40 MG capsule Take 1 capsule (40 mg total) by mouth daily.  . polyethylene glycol (MIRALAX / GLYCOLAX) 17 g packet Take 17 g by mouth daily. (Patient taking  differently: Take 17 g by mouth daily as needed (constipation.). )  . prednisoLONE acetate (PRED FORTE) 1 % ophthalmic suspension Place 1 drop into both eyes in the morning and at bedtime.  . RESTASIS 0.05 % ophthalmic emulsion Place 1 drop into both eyes in the morning and at bedtime.  . sertraline (ZOLOFT) 100 MG tablet Take 1 tablet (100 mg total) by mouth daily. (Patient taking differently: Take 100 mg by mouth at bedtime. )  . tamsulosin (FLOMAX) 0.4 MG CAPS capsule Take 1  capsule (0.4 mg total) by mouth daily after supper.   No facility-administered encounter medications on file as of 01/02/2020.    Goals    .  Client has stress related to ongoing health issues and wishes to talk more about stress over current health issues (pt-stated)      Current Barriers:  [name] Mental Health Concerns  in patient with Chronic Diagnoses of CAD, DM Type II, Depression,recurrent, CKD, COPD and CHF .  Challenges related to managing health issues of client   Clinical Social Work Clinical Goal(s):  [name] Over the next 30 days, client will work with LCSW to address concerns related to health issues of client and client management of health issues faced  Interventions: . LCSW talked with client about current client needs . Talked with client about client recent Colonoscopy . Talked with client about ADLs completion . Talked with client about client occasional shortness of breath . Talked with client about transport needs of client . Talked with client about upcoming client appointments . Talked with client about medication procurement of client  Patient Self Care Activities:  . Self administers medications as prescribed . Attends all scheduled provider appointments . Performs ADL's independently   Plan:  LCSW to call client in next 4 weeks to talk with client about stress issues related to his management of health issues faced Client to communicate with RNCM to discuss nursing needs of client Client to attend scheduled client medical appointments   Initial goal documentation    Follow Up Plan: LCSW to call client in next 4 weeks to talk with him about stress issues related to management of health issues faced  [name].Dhani Imel MSW, LCSW Licensed Clinical Social Worker [facility] [phone]

## 2020-01-07 NOTE — Progress Notes (Signed)
Chronic Care Management   Follow Up Note   01/07/2020 Name: Kenneth Fuller MRN: [id number] DOB: 01-Dec-1945  Referred by: [name], [name], MD Reason for referral : CCM-diabetes    Kenneth Fuller is a 74 y.o. year old male who is a primary care patient of [name], [name], MD. The CCM team was consulted for assistance with chronic disease management and care coordination needs.    Review of patient status, including review of consultants reports, relevant laboratory and other test results, and collaboration with appropriate care team members and the patient's provider was performed as part of comprehensive patient evaluation and provision of chronic care management services.    SDOH (Social Determinants of Health) assessments performed: No See Care Plan activities for detailed interventions related to [facility])     Outpatient Encounter Medications as of 12/26/2019  Medication Sig Note  . albuterol (ACCUNEB) 1.25 MG/3ML nebulizer solution Take 1 ampule by nebulization every 6 (six) hours as needed for wheezing.   [name] albuterol (PROVENTIL HFA;VENTOLIN HFA) 108 (90 Base) MCG/ACT inhaler Inhale 2 puffs into the lungs every 6 (six) hours as needed for wheezing or shortness of breath.   [name] atorvastatin (LIPITOR) 40 MG tablet Take 1 tablet by mouth once daily (Patient taking differently: Take 40 mg by mouth every evening. )   . azelastine (OPTIVAR) 0.05 % ophthalmic solution Place 1 drop into both eyes in the morning and at bedtime.   . bisoprolol (ZEBETA) 5 MG tablet Take 1/2 (one-half) tablet by mouth once daily (Patient taking differently: Take 2.5 mg by mouth daily. )   . Cholecalciferol (VITAMIN D3) 125 MCG (5000 UT) TABS Take 5,000 Units by mouth daily.   . colchicine 0.6 MG tablet Take 1 tablet (0.6 mg total) by mouth daily. (Patient taking differently: Take 0.6 mg by mouth daily as needed (gout). )   . FEROSUL 325 (65 Fe) MG tablet Take 325 mg by mouth 3 (three) times daily.      . furosemide (LASIX) 40 MG tablet Take 40 mg by mouth daily as needed (fluid retention.).   [name] hydrALAZINE (APRESOLINE) 50 MG tablet Take 1 tablet (50 mg total) by mouth 3 (three) times daily.   . isosorbide dinitrate (ISORDIL) 10 MG tablet TAKE 1 TABLET BY MOUTH THREE TIMES DAILY (Patient taking differently: Take 10 mg by mouth 3 (three) times daily. )   . mirabegron ER (MYRBETRIQ) 25 MG TB24 tablet Take 1 tablet (25 mg total) by mouth daily.   . Na Sulfate-K Sulfate-Mg Sulf (SUPREP BOWEL PREP KIT) 17.5-3.13-1.6 GM/177ML SOLN Take 1 kit by mouth as directed.   [name] omeprazole (PRILOSEC) 40 MG capsule Take 1 capsule (40 mg total) by mouth daily. Take 30-60 minutes prior to breakfast.   . polyethylene glycol (MIRALAX / GLYCOLAX) 17 g packet Take 17 g by mouth daily. (Patient taking differently: Take 17 g by mouth daily as needed (constipation.). )   . prednisoLONE acetate (PRED FORTE) 1 % ophthalmic suspension Place 1 drop into both eyes in the morning and at bedtime.   . RESTASIS 0.05 % ophthalmic emulsion Place 1 drop into both eyes in the morning and at bedtime.   . sertraline (ZOLOFT) 100 MG tablet Take 1 tablet (100 mg total) by mouth daily. (Patient taking differently: Take 100 mg by mouth at bedtime. )   . tamsulosin (FLOMAX) 0.4 MG CAPS capsule Take 1 capsule (0.4 mg total) by mouth daily after supper.   . [DISCONTINUED] dapagliflozin propanediol (FARXIGA) 5  MG TABS tablet Take 5 mg by mouth daily.   . [DISCONTINUED] mometasone-formoterol (DULERA) 100-5 MCG/ACT AERO Inhale 2 puffs into the lungs 2 (two) times daily. 05/17/2019: [employer] Patient assistance   No facility-administered encounter medications on file as of 12/26/2019.     Objective:   Goals Addressed              This Visit's Progress     Patient Stated   .  Prescription Assistance (pt-stated)        Prescription assistance needs in a patient with diabetes, asthma, HTN, and OSA.  Current Barriers:  [name] Knowledge Deficits  related to prescription assistance . Film/video editor.   PharmD Clinical Goal(s):  [name] Over the next 90 days, patient will reach out to [facility] PharmD for prescription assistance needs.    Interventions:  . Talked with patient about prescription assistance through [place] and Me.  Patient requesting assistance with Symbicort and [name] for next year 2022 . Discussed importance of taking medication daily as prescribed. Patient stable on current therapy . Applications faxed to [facility] and me for Symbicort and Farxiga for 2022  Patient Self Care Activities:  . Performs ADL's independently . Performs IADL's independently . Unable to independently n/a  Please see past updates related to this goal by clicking on the "Past Updates" button in the selected goal   Medications were changed. Patient needs RX assistance at this time.         Plan:   Telephone follow up appointment with CCM PharmD scheduled for: next month   SIGNATURE Regina Eck, PharmD, BCPS Clinical Pharmacist, [place]  II Phone [phone]

## 2020-01-07 NOTE — Patient Instructions (Signed)
Visit Information  Goals Addressed              This Visit's Progress     Patient Stated   .  Prescription Assistance (pt-stated)        Prescription assistance needs in a patient with diabetes, asthma, HTN, and OSA.  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   PharmD Clinical Goal(s):  [name] Over the next 90 days, patient will reach out to [facility] PharmD for prescription assistance needs.    Interventions:  . Talked with patient about prescription assistance through [place] and Me.  Patient requesting assistance with Symbicort and [name] for next year 2022 . Discussed importance of taking medication daily as prescribed. Patient stable on current therapy . Applications faxed to [facility] and me for Symbicort and Farxiga for 2022  Patient Self Care Activities:  . Performs ADL's independently . Performs IADL's independently . Unable to independently n/a  Please see past updates related to this goal by clicking on the "Past Updates" button in the selected goal   Medications were changed. Patient needs RX assistance at this time.        The patient verbalized understanding of instructions, educational materials, and care plan provided today and agreed to receive a mailed copy of patient instructions, educational materials, and care plan.   Telephone follow up appointment with care management team member scheduled for: next month  SIGNATURE Regina Eck, PharmD, BCPS Clinical Pharmacist, [place]  II Phone [phone]

## 2020-01-09 ENCOUNTER — Telehealth: Payer: Self-pay

## 2020-01-11 ENCOUNTER — Other Ambulatory Visit: Payer: Self-pay | Admitting: Family Medicine

## 2020-01-16 ENCOUNTER — Telehealth: Payer: Self-pay

## 2020-01-17 ENCOUNTER — Telehealth: Payer: Self-pay

## 2020-01-17 NOTE — Telephone Encounter (Signed)
rc for nurse

## 2020-01-18 NOTE — Telephone Encounter (Signed)
Pt's O2 sat is between 90-95 doesn't feeling he is getting enough air, it has been a couple of years since he have the oxygen from [place]. Will NTBS to requalify to place an order. Appt made for Tuesday 01/22/20

## 2020-01-22 ENCOUNTER — Ambulatory Visit (INDEPENDENT_AMBULATORY_CARE_PROVIDER_SITE_OTHER): Payer: PPO | Admitting: Family Medicine

## 2020-01-22 ENCOUNTER — Other Ambulatory Visit: Payer: Self-pay

## 2020-01-22 ENCOUNTER — Ambulatory Visit (INDEPENDENT_AMBULATORY_CARE_PROVIDER_SITE_OTHER): Payer: PPO

## 2020-01-22 VITALS — BP 156/82 | HR 63 | Temp 97.9°F | Ht 67.0 in | Wt 260.0 lb

## 2020-01-22 DIAGNOSIS — R0602 Shortness of breath: Secondary | ICD-10-CM

## 2020-01-22 DIAGNOSIS — I5042 Chronic combined systolic (congestive) and diastolic (congestive) heart failure: Secondary | ICD-10-CM | POA: Diagnosis not present

## 2020-01-22 DIAGNOSIS — J410 Simple chronic bronchitis: Secondary | ICD-10-CM | POA: Diagnosis not present

## 2020-01-22 DIAGNOSIS — I2510 Atherosclerotic heart disease of native coronary artery without angina pectoris: Secondary | ICD-10-CM | POA: Diagnosis not present

## 2020-01-22 MED ORDER — BREZTRI AEROSPHERE 160-9-4.8 MCG/ACT IN AERO: 2.0000 | INHALATION_SPRAY | Freq: Two times a day (BID) | RESPIRATORY_TRACT | 0 refills | Status: DC

## 2020-01-22 NOTE — Patient Instructions (Signed)
STOP Symbicort START Breztri: TWO (2) puffs TWICE daily  Will call you with results of xray EKG performed today and copy to be sent to your heart doctor.  See Dr [name] in 2 weeks for recheck.  IF symptoms worsen, please seek immediate medical attention.

## 2020-01-22 NOTE — Progress Notes (Signed)
  I agree his ECG is unchanged and shows no sign of coronary insufficiency. CXR without CHF.  Thank you for the follow up. Celinda Dethlefs

## 2020-01-22 NOTE — Progress Notes (Signed)
Subjective: CC: shortness of breath PCP: [name], [name], MD AVW:[id number] D Grudzien is a 74 y.o. male presenting to clinic today for:  1. Shortness of breath Patient reports acute onset of shortness of breath that is been ongoing for about a month.  He notes that it gradually started and has been gradually getting worse.  Shortness of breath is now present intermittently at rest (he notes feel like he cannot take a deep breath in) and also with exertion he feels short of breath.  He has previously required oxygen but had gotten better and did not feel like he needed anymore so returned it.  Medical history is significant for COPD, CHF, CKD3.  He is compliant with his Symbicort twice daily and is also been using his albuterol twice daily because he feels winded.  He denies any increase in weight, no edema.  He has not required any Lasix because he is not had swelling.  He does report some intermittent left-sided chest pain that he is experienced before but it seems to be a little bit more prominent.  Last visit with his cardiologist was in August and he is expected to follow-up again in March.  Medical history is significant for coronary artery disease.  ROS: Per HPI  Allergies  Allergen Reactions  . Amlodipine Besy-Benazepril Hcl Swelling and Other (See Comments)    Makes tongue swell (lotrel)  . Oxycodone Itching  . Phenergan [Promethazine Hcl] Other (See Comments)    "I can't remember."  . Hydrocodone Itching    Can tolerate in low doses   Past Medical History:  Diagnosis Date  . Allergy   . Asthma   . CAD (coronary artery disease)   . CHF (congestive heart failure) ([place])   . Diabetes mellitus   . Fibromyalgia   . GERD (gastroesophageal reflux disease)   . GI bleeding   . Gout   . Hyperlipidemia   . Hypertension   . Hypogonadism male   . Insomnia   . MRSA cellulitis   . Neuropathy   . Obesity   . Shortness of breath dyspnea    with exertion   . Sleep apnea     cpap- 14   . Wheezing    no asthma diagnosis    Current Outpatient Medications:  .  albuterol (ACCUNEB) 1.25 MG/3ML nebulizer solution, Take 1 ampule by nebulization every 6 (six) hours as needed for wheezing., Disp: , Rfl:  .  albuterol (PROVENTIL HFA;VENTOLIN HFA) 108 (90 Base) MCG/ACT inhaler, Inhale 2 puffs into the lungs every 6 (six) hours as needed for wheezing or shortness of breath., Disp: 1 Inhaler, Rfl: 2 .  atorvastatin (LIPITOR) 40 MG tablet, Take 1 tablet by mouth once daily (Patient taking differently: Take 40 mg by mouth every evening. ), Disp: 90 tablet, Rfl: 4 .  azelastine (OPTIVAR) 0.05 % ophthalmic solution, Place 1 drop into both eyes in the morning and at bedtime., Disp: , Rfl:  .  bisoprolol (ZEBETA) 5 MG tablet, Take 1/2 (one-half) tablet by mouth once daily (Patient taking differently: Take 2.5 mg by mouth daily. ), Disp: 45 tablet, Rfl: 0 .  budesonide-formoterol (SYMBICORT) 160-4.5 MCG/ACT inhaler, Inhale 2 puffs into the lungs 2 (two) times daily., Disp: 1 each, Rfl: 11 .  Cholecalciferol (VITAMIN D3) 125 MCG (5000 UT) TABS, Take 5,000 Units by mouth daily., Disp: , Rfl:  .  colchicine 0.6 MG tablet, Take 1 tablet (0.6 mg total) by mouth daily. (Patient taking differently: Take 0.6 mg  by mouth daily as needed (gout). ), Disp: 20 tablet, Rfl: 1 .  dapagliflozin propanediol (FARXIGA) 5 MG TABS tablet, Take 1 tablet (5 mg total) by mouth daily., Disp: 90 tablet, Rfl: 3 .  FEROSUL 325 (65 Fe) MG tablet, Take 325 mg by mouth 3 (three) times daily. , Disp: , Rfl:  .  furosemide (LASIX) 40 MG tablet, Take 40 mg by mouth daily as needed (fluid retention.)., Disp: , Rfl:  .  hydrALAZINE (APRESOLINE) 50 MG tablet, Take 1 tablet (50 mg total) by mouth 3 (three) times daily., Disp: 90 tablet, Rfl: 3 .  isosorbide dinitrate (ISORDIL) 10 MG tablet, TAKE 1 TABLET BY MOUTH THREE TIMES DAILY, Disp: 90 tablet, Rfl: 5 .  mirabegron ER (MYRBETRIQ) 25 MG TB24 tablet, Take 1 tablet (25 mg  total) by mouth daily., Disp: 30 tablet, Rfl: 3 .  Na Sulfate-K Sulfate-Mg Sulf (SUPREP BOWEL PREP KIT) 17.5-3.13-1.6 GM/177ML SOLN, Take 1 kit by mouth as directed., Disp: 324 mL, Rfl: 0 .  omeprazole (PRILOSEC) 40 MG capsule, Take 1 capsule (40 mg total) by mouth daily. Take 30-60 minutes prior to breakfast., Disp: 30 capsule, Rfl: 6 .  omeprazole (PRILOSEC) 40 MG capsule, Take 1 capsule (40 mg total) by mouth daily., Disp: 90 capsule, Rfl: 3 .  polyethylene glycol (MIRALAX / GLYCOLAX) 17 g packet, Take 17 g by mouth daily. (Patient taking differently: Take 17 g by mouth daily as needed (constipation.). ), Disp: 30 each, Rfl: 0 .  prednisoLONE acetate (PRED FORTE) 1 % ophthalmic suspension, Place 1 drop into both eyes in the morning and at bedtime., Disp: , Rfl:  .  RESTASIS 0.05 % ophthalmic emulsion, Place 1 drop into both eyes in the morning and at bedtime., Disp: , Rfl:  .  sertraline (ZOLOFT) 100 MG tablet, Take 1 tablet (100 mg total) by mouth daily. (Patient taking differently: Take 100 mg by mouth at bedtime. ), Disp: 90 tablet, Rfl: 3 .  tamsulosin (FLOMAX) 0.4 MG CAPS capsule, Take 1 capsule (0.4 mg total) by mouth daily after supper., Disp: 90 capsule, Rfl: 3 Social History   Socioeconomic History  . Marital status: Married    Spouse name: Butch Penny  . Number of children: 3  . Years of education: 8  . Highest education level: GED or equivalent  Occupational History  . 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 Smoking status: Former Smoker    Quit date: 02/08/1969    Years since quitting: 50.9  . Smokeless tobacco: Former Systems developer    Quit date: Pharmacologist  . Vaping Use: Never used  Substance and Sexual Activity  . Alcohol use: No    Alcohol/week: 0.0 standard drinks  . Drug use: No  . Sexual activity: Not Currently  Other Topics Concern  . Not on file  Social History Narrative   Drinks caffeine tea occasionally     Social Determinants of Health   Financial Resource Strain: Low Risk   . Difficulty of Paying Living Expenses: Not hard at all  Food Insecurity: No Food Insecurity  . Worried About Charity fundraiser in the Last Year: Never true  . Ran Out of Food in the Last Year: Never true  Transportation Needs: No Transportation Needs  . Lack of Transportation (Medical): No  . Lack of Transportation (Non-Medical): No  Physical Activity: Inactive  . Days of Exercise per Week: 0 days  . Minutes of  Exercise per Session: 0 min  Stress: No Stress Concern Present  . Feeling of Stress : Not at all  Social Connections: Socially Integrated  . Frequency of Communication with Friends and Family: More than three times a week  . Frequency of Social Gatherings with Friends and Family: More than three times a week  . Attends Religious Services: More than 4 times per year  . Active Member of Clubs or Organizations: Yes  . Attends Archivist Meetings: More than 4 times per year  . Marital Status: Married  Human resources officer Violence: Not At Risk  . Fear of Current or Ex-Partner: No  . Emotionally Abused: No  . Physically Abused: No  . Sexually Abused: No   Family History  Problem Relation Age of Onset  . Colon cancer Mother   . Diabetes Father        siblings  . Heart disease Father        brother  . Heart attack Father   . Kidney disease Sister   . Heart failure Sister   . Heart disease Brother   . Heart attack Son 83  . Drug abuse Sister   . Heart disease Brother   . Deep vein thrombosis Brother   . Colon polyps Neg Hx     Objective: Office vital signs reviewed. BP (!) 156/82   Pulse 63   Temp 97.9 F (36.6 C) (Temporal)   Ht _0  (1.702 m)   Wt 260 lb (117.9 kg)   SpO2 96%   BMI 40.72 kg/m   Physical Examination:  General: Awake, alert. No acute distress HEENT: Normal, sclera white Cardio: regular rate and rhythm, occasional extra beat heard. S1S2 heard, no murmurs  appreciated Pulm: Globally decreased breath sounds but no wheezes, rhonchi or rales.  He has normal work of breathing on room air.  With ambulation he only went down to about 91% on room air. Extremities: Warm, well perfused.  No edema  Assessment/ Plan: 74 y.o. male   Shortness of breath - Plan: DG Chest 2 View, EKG 12-Lead, Budeson-Glycopyrrol-Formoterol (BREZTRI AEROSPHERE) 160-9-4.8 MCG/ACT AERO  Simple chronic bronchitis (HCC) - Plan: Budeson-Glycopyrrol-Formoterol (BREZTRI AEROSPHERE) 160-9-4.8 MCG/ACT AERO  Coronary artery disease involving native coronary artery of native heart without angina pectoris - Plan: EKG 12-Lead  Chronic combined systolic and diastolic congestive heart failure (HCC) - Plan: EKG 12-Lead  I suspect that this is uncontrolled COPD.  I'm advancing his inhalation regimen to [facility].  We will obtain chest x-ray as well.  No evidence of fluid overload on exam so I think it is unlikely to be a CHF exacerbation.  Given medical history of CAD EKG was obtained since shortness of breath can be a cardiac equivalent.  Personal review of the EKG did not demonstrate any acute findings suggestive of ischemia.  It looked relatively unchanged from his September EKG.  Will CC copy to his cardiologist  I would like him to see his PCP in the next 2 weeks for recheck.  He understands red flag signs and symptoms warranting further evaluation in the emergency department.  No orders of the defined types were placed in this encounter.  No orders of the defined types were placed in this encounter.    Janora Norlander, DO [place] [phone]

## 2020-01-23 ENCOUNTER — Ambulatory Visit: Payer: Self-pay

## 2020-01-23 DIAGNOSIS — J410 Simple chronic bronchitis: Secondary | ICD-10-CM

## 2020-01-23 DIAGNOSIS — R0602 Shortness of breath: Secondary | ICD-10-CM

## 2020-01-23 DIAGNOSIS — J441 Chronic obstructive pulmonary disease with (acute) exacerbation: Secondary | ICD-10-CM

## 2020-01-23 MED ORDER — BREZTRI AEROSPHERE 160-9-4.8 MCG/ACT IN AERO: 2.0000 | INHALATION_SPRAY | Freq: Two times a day (BID) | RESPIRATORY_TRACT | 4 refills | Status: DC

## 2020-01-23 NOTE — Progress Notes (Signed)
Chronic Care Management   Follow Up Note   01/23/2020 Name: Kenneth Fuller MRN: [id number] DOB: [birth year]  Referred by: [name], [name], MD Reason for referral : CCM-COPD    Kenneth Fuller is a 74 y.o. year old male who is a primary care patient of [name], [name], MD. The CCM team was consulted for assistance with chronic disease management and care coordination needs.    Review of patient status, including review of consultants reports, relevant laboratory and other test results, and collaboration with appropriate care team members and the patient's provider was performed as part of comprehensive patient evaluation and provision of chronic care management services.    SDOH (Social Determinants of Health) assessments performed: No See Care Plan activities for detailed interventions related to [facility])     Outpatient Encounter Medications as of 01/23/2020  Medication Sig  . albuterol (ACCUNEB) 1.25 MG/3ML nebulizer solution Take 1 ampule by nebulization every 6 (six) hours as needed for wheezing.  [name] albuterol (PROVENTIL HFA;VENTOLIN HFA) 108 (90 Base) MCG/ACT inhaler Inhale 2 puffs into the lungs every 6 (six) hours as needed for wheezing or shortness of breath.  [name] atorvastatin (LIPITOR) 40 MG tablet Take 1 tablet by mouth once daily (Patient taking differently: Take 40 mg by mouth every evening.)  . azelastine (OPTIVAR) 0.05 % ophthalmic solution Place 1 drop into both eyes in the morning and at bedtime.  . bisoprolol (ZEBETA) 5 MG tablet Take 1/2 (one-half) tablet by mouth once daily (Patient taking differently: Take 2.5 mg by mouth daily.)  . Budeson-Glycopyrrol-Formoterol (BREZTRI AEROSPHERE) 160-9-4.8 MCG/ACT AERO Inhale 2 puffs into the lungs in the morning and at bedtime.  . Cholecalciferol (VITAMIN D3) 125 MCG (5000 UT) TABS Take 5,000 Units by mouth daily.  . colchicine 0.6 MG tablet Take 1 tablet (0.6 mg total) by mouth daily. (Patient taking differently: Take  0.6 mg by mouth daily as needed (gout).)  . dapagliflozin propanediol (FARXIGA) 5 MG TABS tablet Take 1 tablet (5 mg total) by mouth daily.  . FEROSUL 325 (65 Fe) MG tablet Take 325 mg by mouth 3 (three) times daily.   . furosemide (LASIX) 40 MG tablet Take 40 mg by mouth daily as needed (fluid retention.).  [name] hydrALAZINE (APRESOLINE) 50 MG tablet Take 1 tablet (50 mg total) by mouth 3 (three) times daily.  . isosorbide dinitrate (ISORDIL) 10 MG tablet TAKE 1 TABLET BY MOUTH THREE TIMES DAILY  . mirabegron ER (MYRBETRIQ) 25 MG TB24 tablet Take 1 tablet (25 mg total) by mouth daily.  . Na Sulfate-K Sulfate-Mg Sulf (SUPREP BOWEL PREP KIT) 17.5-3.13-1.6 GM/177ML SOLN Take 1 kit by mouth as directed.  [name] omeprazole (PRILOSEC) 40 MG capsule Take 1 capsule (40 mg total) by mouth daily. Take 30-60 minutes prior to breakfast.  . omeprazole (PRILOSEC) 40 MG capsule Take 1 capsule (40 mg total) by mouth daily.  . polyethylene glycol (MIRALAX / GLYCOLAX) 17 g packet Take 17 g by mouth daily. (Patient taking differently: Take 17 g by mouth daily as needed (constipation.).)  . prednisoLONE acetate (PRED FORTE) 1 % ophthalmic suspension Place 1 drop into both eyes in the morning and at bedtime.  . RESTASIS 0.05 % ophthalmic emulsion Place 1 drop into both eyes in the morning and at bedtime.  . sertraline (ZOLOFT) 100 MG tablet Take 1 tablet (100 mg total) by mouth daily. (Patient taking differently: Take 100 mg by mouth at bedtime.)  . tamsulosin (FLOMAX) 0.4 MG CAPS capsule Take 1 capsule (  0.4 mg total) by mouth daily after supper.  . [DISCONTINUED] Budeson-Glycopyrrol-Formoterol (BREZTRI AEROSPHERE) 160-9-4.8 MCG/ACT AERO Inhale 2 puffs into the lungs in the morning and at bedtime.   No facility-administered encounter medications on file as of 01/23/2020.     Objective:   Goals Addressed              This Visit's Progress     Patient Stated   .  Prescription Assistance (pt-stated)         Prescription assistance needs in a patient with diabetes, asthma, HTN, and OSA.  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   PharmD Clinical Goal(s):  [name] Over the next 90 days, patient will reach out to [facility] PharmD for prescription assistance needs.    Interventions:  . Talked with patient about prescription assistance through [place] and Me.   . Discussed importance of taking medication daily as prescribed.  . Patient switched from Symbicort to Breztri (triple therapy)--new RX sent to [facility] and Me patient assistance program.  Their electronic pharmacy is call [facility] and is within [place].  Will cancel Symbicort . Patient [place] now [place] for [place] for [place] for 2022.  Medications to ship to patient's home  Patient Self Care Activities:  . Performs ADL's independently . Performs IADL's independently . Unable to independently n/a  Please see past updates related to this goal by clicking on the "Past Updates" button in the selected goal   Medications were changed. Patient needs RX assistance at this time.         Plan:   The care management team will reach out to the patient again over the next 30 days.    SIGNATURE  Regina Eck, PharmD, BCPS Clinical Pharmacist, [place]  II Phone [phone]

## 2020-02-06 ENCOUNTER — Telehealth: Payer: Self-pay

## 2020-02-06 NOTE — Telephone Encounter (Signed)
Pt called to let Almyra Free and PCP know that the sample inhalers he was recently given to try are working good for him.

## 2020-02-06 NOTE — Telephone Encounter (Signed)
Pt returned missed call. Reviewed Julie's note with pt. Pt voiced understanding. Will pick up sample of inhaler tomorrow.

## 2020-02-06 NOTE — Telephone Encounter (Signed)
New inhaler (breztri) RX sent to patient assistance program via [place] and Me. Medication should ship around the 2-3 rd week of January to patient's home  I am unable to check on the status of his medication-->Patient can call [phone] to check on the status.  Wait times are very long.  I placed additional Breztri inhaler samples up front for patient to pick up

## 2020-02-06 NOTE — Telephone Encounter (Signed)
Attempted to contact patient - NA

## 2020-02-06 NOTE — Telephone Encounter (Signed)
FYI 

## 2020-02-07 ENCOUNTER — Encounter: Payer: Self-pay | Admitting: Family Medicine

## 2020-02-07 ENCOUNTER — Ambulatory Visit (INDEPENDENT_AMBULATORY_CARE_PROVIDER_SITE_OTHER): Payer: PPO | Admitting: Family Medicine

## 2020-02-07 ENCOUNTER — Other Ambulatory Visit: Payer: Self-pay

## 2020-02-07 VITALS — BP 103/62 | HR 62 | Ht 67.0 in | Wt 264.0 lb

## 2020-02-07 DIAGNOSIS — J441 Chronic obstructive pulmonary disease with (acute) exacerbation: Secondary | ICD-10-CM | POA: Diagnosis not present

## 2020-02-07 MED ORDER — PREDNISONE 20 MG PO TABS: ORAL_TABLET | ORAL | 0 refills | Status: DC

## 2020-02-07 NOTE — Progress Notes (Signed)
BP 103/62   Pulse 62   Ht _0  (1.702 m)   Wt 264 lb (119.7 kg)   SpO2 94%   BMI 41.35 kg/m    Subjective:   Patient ID: Kenneth Fuller, male    DOB: 06-19-1945, 74 y.o.   MRN: [id number]  HPI: Kenneth Fuller is a 74 y.o. male presenting on 02/07/2020 for Shortness of Breath   HPI Patient is still coming in with complaints of shortness of breath, its been improved but still present.  He did have a chest x-ray a couple weeks ago that showed no fluid overload and he does not feel like he is having any fluid overload or increased swelling.  He does feel like it is his breathing and wheezing that he is having and that he has a cough that produces some phlegm.  He denies any fevers or chills.  He does still feel little short of breath although his oxygen numbers have been up.  Relevant past medical, surgical, family and social history reviewed and updated as indicated. Interim medical history since our last visit reviewed. Allergies and medications reviewed and updated.  Review of Systems  Constitutional: Negative for chills and fever.  HENT: Positive for congestion. Negative for ear discharge, ear pain, postnasal drip, rhinorrhea, sinus pressure, sneezing, sore throat and voice change.   Eyes: Negative for pain, discharge, redness and visual disturbance.  Respiratory: Positive for cough, shortness of breath and wheezing.   Cardiovascular: Negative for chest pain and leg swelling.  Musculoskeletal: Negative for gait problem.  Skin: Negative for rash.  All other systems reviewed and are negative.   Per HPI unless specifically indicated above   Allergies as of 02/07/2020      Reactions   Amlodipine Besy-benazepril Hcl Swelling, Other (See Comments)   Makes tongue swell (lotrel)   Oxycodone Itching   Phenergan [promethazine Hcl] Other (See Comments)   "I can't remember."   Hydrocodone Itching   Can tolerate in low doses      Medication List       Accurate as of  February 07, 2020 12:02 PM. If you have any questions, ask your nurse or doctor.        albuterol 1.25 MG/3ML nebulizer solution Commonly known as: ACCUNEB Take 1 ampule by nebulization every 6 (six) hours as needed for wheezing.   albuterol 108 (90 Base) MCG/ACT inhaler Commonly known as: VENTOLIN HFA Inhale 2 puffs into the lungs every 6 (six) hours as needed for wheezing or shortness of breath.   atorvastatin 40 MG tablet Commonly known as: LIPITOR Take 1 tablet by mouth once daily What changed: when to take this   azelastine 0.05 % ophthalmic solution Commonly known as: OPTIVAR Place 1 drop into both eyes in the morning and at bedtime.   bisoprolol 5 MG tablet Commonly known as: ZEBETA Take 1/2 (one-half) tablet by mouth once daily What changed: See the new instructions.   Breztri Aerosphere 160-9-4.8 MCG/ACT Aero Generic drug: Budeson-Glycopyrrol-Formoterol Inhale 2 puffs into the lungs in the morning and at bedtime.   colchicine 0.6 MG tablet Take 1 tablet (0.6 mg total) by mouth daily. What changed:   when to take this  reasons to take this   dapagliflozin propanediol 5 MG Tabs tablet Commonly known as: Farxiga Take 1 tablet (5 mg total) by mouth daily.   FeroSul 325 (65 FE) MG tablet Generic drug: ferrous sulfate Take 325 mg by mouth 3 (three) times daily.   furosemide  40 MG tablet Commonly known as: LASIX Take 40 mg by mouth daily as needed (fluid retention.).   hydrALAZINE 50 MG tablet Commonly known as: APRESOLINE Take 1 tablet (50 mg total) by mouth 3 (three) times daily.   isosorbide dinitrate 10 MG tablet Commonly known as: ISORDIL TAKE 1 TABLET BY MOUTH THREE TIMES DAILY   mirabegron ER 25 MG Tb24 tablet Commonly known as: Myrbetriq Take 1 tablet (25 mg total) by mouth daily.   omeprazole 40 MG capsule Commonly known as: PRILOSEC Take 1 capsule (40 mg total) by mouth daily. Take 30-60 minutes prior to breakfast.   omeprazole 40 MG  capsule Commonly known as: PRILOSEC Take 1 capsule (40 mg total) by mouth daily.   polyethylene glycol 17 g packet Commonly known as: MIRALAX / GLYCOLAX Take 17 g by mouth daily. What changed:   when to take this  reasons to take this   prednisoLONE acetate 1 % ophthalmic suspension Commonly known as: PRED FORTE Place 1 drop into both eyes in the morning and at bedtime.   Restasis 0.05 % ophthalmic emulsion Generic drug: cycloSPORINE Place 1 drop into both eyes in the morning and at bedtime.   sertraline 100 MG tablet Commonly known as: ZOLOFT Take 1 tablet (100 mg total) by mouth daily. What changed: when to take this   Suprep Bowel Prep Kit 17.5-3.13-1.6 GM/177ML Soln Generic drug: Na Sulfate-K Sulfate-Mg Sulf Take 1 kit by mouth as directed.   tamsulosin 0.4 MG Caps capsule Commonly known as: FLOMAX Take 1 capsule (0.4 mg total) by mouth daily after supper.   Vitamin D3 125 MCG (5000 UT) Tabs Take 5,000 Units by mouth daily.        Objective:   BP 103/62   Pulse 62   Ht _0  (1.702 m)   Wt 264 lb (119.7 kg)   SpO2 94%   BMI 41.35 kg/m   Wt Readings from Last 3 Encounters:  02/07/20 264 lb (119.7 kg)  01/22/20 260 lb (117.9 kg)  12/20/19 250 lb (113.4 kg)    Physical Exam Vitals and nursing note reviewed.  Constitutional:      General: He is not in acute distress.    Appearance: He is well-developed and well-nourished. He is not diaphoretic.  Eyes:     General: No scleral icterus.    Extraocular Movements: EOM normal.     Conjunctiva/sclera: Conjunctivae normal.  Cardiovascular:     Rate and Rhythm: Normal rate and regular rhythm.     Pulses: Intact distal pulses.     Heart sounds: Normal heart sounds. No murmur heard.   Pulmonary:     Effort: Pulmonary effort is normal. No respiratory distress.     Breath sounds: Normal breath sounds. No wheezing, rhonchi or rales.  Musculoskeletal:        General: No edema. Normal range of motion.   Skin:    General: Skin is warm and dry.     Findings: No rash.  Neurological:     Mental Status: He is alert and oriented to person, place, and time.     Coordination: Coordination normal.  Psychiatric:        Mood and Affect: Mood and affect normal.        Behavior: Behavior normal.       Assessment & Plan:   Problem List Items Addressed This Visit   None   Visit Diagnoses    COPD with acute exacerbation ([place])    -  Primary  Relevant Medications   predniSONE (DELTASONE) 20 MG tablet      Will treat with prednisone, warned of watching for sugars and fluid overload.  Discussed going to a pulmonologist but he wants to wait and see a little bit longer and see if improves. Follow up plan: Return if symptoms worsen or fail to improve.  Counseling provided for all of the vaccine components No orders of the defined types were placed in this encounter.   Caryl Pina, MD [facility] 02/07/2020, 12:02 PM

## 2020-02-12 ENCOUNTER — Ambulatory Visit: Payer: PPO | Admitting: Licensed Clinical Social Worker

## 2020-02-12 DIAGNOSIS — J441 Chronic obstructive pulmonary disease with (acute) exacerbation: Secondary | ICD-10-CM

## 2020-02-12 DIAGNOSIS — F339 Major depressive disorder, recurrent, unspecified: Secondary | ICD-10-CM

## 2020-02-12 DIAGNOSIS — E1142 Type 2 diabetes mellitus with diabetic polyneuropathy: Secondary | ICD-10-CM

## 2020-02-12 DIAGNOSIS — I2510 Atherosclerotic heart disease of native coronary artery without angina pectoris: Secondary | ICD-10-CM

## 2020-02-12 DIAGNOSIS — N183 Chronic kidney disease, stage 3 unspecified: Secondary | ICD-10-CM

## 2020-02-12 DIAGNOSIS — I5042 Chronic combined systolic (congestive) and diastolic (congestive) heart failure: Secondary | ICD-10-CM

## 2020-02-12 NOTE — Patient Instructions (Addendum)
Licensed Clinical Social Worker Visit Information  Goals we discussed today:  .  Client has stress related to ongoing health issues and wishes to talk more about stress over current health issues (pt-stated)        Current Barriers:   Mental Health Concerns  in patient with Chronic Diagnoses of CAD, DM Type II, Depression,recurrent, CKD, COPD and CHF   Challenges related to managing health issues of client   Clinical Social Work Clinical Goal(s):   Over the next 30 days, client will work with LCSW to address concerns related to health issues of client and client management of health issues faced  Interventions:  LCSW talked with client about client use of inhaler LCSW talked with client about current client needs Talked with client about ADLs completion Talked with client about client occasional shortness of breath Talked with client about transport needs of client Talked with client about upcoming client appointments Talked with client about medication procurement of client Talked with client about his appointment with cardiologist in March of 2022 Talked with client about sleeping issues of client Talked with client about vision of client Talked with client about appetite of client Encouraged client to talk with RNCM as needed for nursing support Encouraged client to talk with Dr. [name], Pharmacist about medication needs of client (he spoke of his use of inhalers)  Patient Self Care Activities:   Self administers medications as prescribed  Attends all scheduled provider appointments  Performs ADL's independently   Plan:  LCSW to call client in next 4 weeks to talk with client about stress issues related to his management of health issues faced Client to communicate with RNCM to discuss nursing needs of client Client to attend scheduled client medical appointments   Initial goal documentation    Follow Up Plan: LCSW to call client in next 4 weeks  to talk with him about stress issues related to management of health issues faced  Materials Provided: No  The patient verbalized understanding of instructions provided today and declined a print copy of patient instruction materials.   [name].Anderson Middlebrooks MSW, LCSW Licensed Clinical Social Worker [facility] [phone]

## 2020-02-12 NOTE — Chronic Care Management (AMB) (Signed)
Chronic Care Management    Clinical Social Work Follow Up Note  02/12/2020 Name: Kenneth Fuller MRN: [id number] DOB: September 23, 1945  Kenneth Fuller is a 75 y.o. year old male who is a primary care patient of [name], Kenneth Kaufmann, MD. The CCM team was consulted for assistance with [name] .   Review of patient status, including review of consultants reports, other relevant assessments, and collaboration with appropriate care team members and the patient's provider was performed as part of comprehensive patient evaluation and provision of chronic care management services.    SDOH (Social Determinants of Health) assessments performed: No; risk for depression; risk for stress; risk for tobacco use; risk for physical inactivity  [place] Office Visit from 05/28/2019 in [facility]  PHQ-9 Total Score 10      GAD 7 : Generalized Anxiety Score 05/28/2019 11/11/2017  Nervous, Anxious, on Edge 0 3  Control/stop worrying 2 3  Worry too much - different things 2 3  Trouble relaxing 2 3  Restless 0 3  Easily annoyed or irritable 2 3  Afraid - awful might happen 0 0  Total GAD 7 Score 8 18  Anxiety Difficulty Somewhat difficult Somewhat difficult    Outpatient Encounter Medications as of 02/12/2020  Medication Sig  . albuterol (ACCUNEB) 1.25 MG/3ML nebulizer solution Take 1 ampule by nebulization every 6 (six) hours as needed for wheezing.  [name] albuterol (PROVENTIL HFA;VENTOLIN HFA) 108 (90 Base) MCG/ACT inhaler Inhale 2 puffs into the lungs every 6 (six) hours as needed for wheezing or shortness of breath.  [name] atorvastatin (LIPITOR) 40 MG tablet Take 1 tablet by mouth once daily (Patient taking differently: Take 40 mg by mouth every evening.)  . azelastine (OPTIVAR) 0.05 % ophthalmic solution Place 1 drop into both eyes in the morning and at bedtime.  . bisoprolol (ZEBETA) 5 MG tablet Take 1/2 (one-half) tablet by mouth once daily (Patient taking differently:  Take 2.5 mg by mouth daily.)  . Budeson-Glycopyrrol-Formoterol (BREZTRI AEROSPHERE) 160-9-4.8 MCG/ACT AERO Inhale 2 puffs into the lungs in the morning and at bedtime.  . Cholecalciferol (VITAMIN D3) 125 MCG (5000 UT) TABS Take 5,000 Units by mouth daily.  . colchicine 0.6 MG tablet Take 1 tablet (0.6 mg total) by mouth daily. (Patient taking differently: Take 0.6 mg by mouth daily as needed (gout).)  . dapagliflozin propanediol (FARXIGA) 5 MG TABS tablet Take 1 tablet (5 mg total) by mouth daily.  . FEROSUL 325 (65 Fe) MG tablet Take 325 mg by mouth 3 (three) times daily.   . furosemide (LASIX) 40 MG tablet Take 40 mg by mouth daily as needed (fluid retention.).  [name] hydrALAZINE (APRESOLINE) 50 MG tablet Take 1 tablet (50 mg total) by mouth 3 (three) times daily.  . isosorbide dinitrate (ISORDIL) 10 MG tablet TAKE 1 TABLET BY MOUTH THREE TIMES DAILY  . mirabegron ER (MYRBETRIQ) 25 MG TB24 tablet Take 1 tablet (25 mg total) by mouth daily.  . Na Sulfate-K Sulfate-Mg Sulf (SUPREP BOWEL PREP KIT) 17.5-3.13-1.6 GM/177ML SOLN Take 1 kit by mouth as directed.  [name] omeprazole (PRILOSEC) 40 MG capsule Take 1 capsule (40 mg total) by mouth daily. Take 30-60 minutes prior to breakfast.  . omeprazole (PRILOSEC) 40 MG capsule Take 1 capsule (40 mg total) by mouth daily.  . polyethylene glycol (MIRALAX / GLYCOLAX) 17 g packet Take 17 g by mouth daily. (Patient taking differently: Take 17 g by mouth daily as needed (constipation.).)  . prednisoLONE acetate (PRED  FORTE) 1 % ophthalmic suspension Place 1 drop into both eyes in the morning and at bedtime.  . predniSONE (DELTASONE) 20 MG tablet 2 po at same time daily for 5 days  . RESTASIS 0.05 % ophthalmic emulsion Place 1 drop into both eyes in the morning and at bedtime.  . sertraline (ZOLOFT) 100 MG tablet Take 1 tablet (100 mg total) by mouth daily. (Patient taking differently: Take 100 mg by mouth at bedtime.)  . tamsulosin (FLOMAX) 0.4 MG CAPS capsule Take 1  capsule (0.4 mg total) by mouth daily after supper.   No facility-administered encounter medications on file as of 02/12/2020.    Goals    .  Client has stress related to ongoing health issues and wishes to talk more about stress over current health issues (pt-stated)      Current Barriers:  [name] Mental Health Concerns  in patient with Chronic Diagnoses of CAD, DM Type II, Depression,recurrent, CKD, COPD and CHF .  Challenges related to managing health issues of client   Clinical Social Work Clinical Goal(s):  [name] Over the next 30 days, client will work with LCSW to address concerns related to health issues of client and client management of health issues faced  Interventions:  LCSW talked with client about client use of inhaler LCSW talked with client about current client needs Talked with client about ADLs completion Talked with client about client occasional shortness of breath Talked with client about transport needs of client Talked with client about upcoming client appointments Talked with client about medication procurement of client Talked with client about his appointment with cardiologist in March of 2022 Talked with client about sleeping issues of client Talked with client about vision of client Talked with client about appetite of client Encouraged client to talk with RNCM as needed for nursing support Encouraged client to talk with Dr. [name], Pharmacist about medication needs of client (he spoke of his use of inhalers)  Patient Self Care Activities:  . Self administers medications as prescribed . Attends all scheduled provider appointments . Performs ADL's independently   Plan:  LCSW to call client in next 4 weeks to talk with client about stress issues related to his management of health issues faced Client to communicate with RNCM to discuss nursing needs of client Client to attend scheduled client medical appointments   Initial goal documentation    Follow  Up Plan: LCSW to call client in next 4 weeks to talk with him about stress issues related to management of health issues faced  [name].Abi Shoults MSW, LCSW Licensed Clinical Social Worker [facility] [phone]

## 2020-02-18 ENCOUNTER — Other Ambulatory Visit: Payer: Self-pay

## 2020-02-18 ENCOUNTER — Telehealth: Payer: Self-pay | Admitting: Gastroenterology

## 2020-02-18 ENCOUNTER — Other Ambulatory Visit: Payer: PPO

## 2020-02-18 DIAGNOSIS — D509 Iron deficiency anemia, unspecified: Secondary | ICD-10-CM

## 2020-02-18 NOTE — Telephone Encounter (Signed)
LAB ORDER FAXED AS REQUESTED AND PT AWARE

## 2020-02-18 NOTE — Telephone Encounter (Signed)
Pt called requesting to have labs done at his PCP office at [facility] in [place]. Pt is scheduled for an appt on 1/18 and is at his PCP office at this moment and will wait for lab orders. Pls fax order to [phone].

## 2020-02-18 NOTE — Addendum Note (Signed)
Addended by: Liliane Bade on: 02/18/2020 03:51 PM   Modules accepted: Orders

## 2020-02-19 ENCOUNTER — Other Ambulatory Visit: Payer: PPO

## 2020-02-19 DIAGNOSIS — D509 Iron deficiency anemia, unspecified: Secondary | ICD-10-CM | POA: Diagnosis not present

## 2020-02-19 LAB — CBC WITH DIFFERENTIAL/PLATELET
Basophils Absolute: 0.1 10*3/uL (ref 0.0–0.2)
Basos: 1 %
EOS (ABSOLUTE): 0.2 10*3/uL (ref 0.0–0.4)
Eos: 2 %
Hematocrit: 51.1 % — ABNORMAL HIGH (ref 37.5–51.0)
Hemoglobin: 16.3 g/dL (ref 13.0–17.7)
Immature Grans (Abs): 0 10*3/uL (ref 0.0–0.1)
Immature Granulocytes: 0 %
Lymphocytes Absolute: 2 10*3/uL (ref 0.7–3.1)
Lymphs: 26 %
MCH: 27.1 pg (ref 26.6–33.0)
MCHC: 31.9 g/dL (ref 31.5–35.7)
MCV: 85 fL (ref 79–97)
Monocytes Absolute: 0.8 10*3/uL (ref 0.1–0.9)
Monocytes: 10 %
Neutrophils Absolute: 4.9 10*3/uL (ref 1.4–7.0)
Neutrophils: 61 %
Platelets: 189 10*3/uL (ref 150–450)
RBC: 6.02 x10E6/uL — ABNORMAL HIGH (ref 4.14–5.80)
RDW: 15.4 % (ref 11.6–15.4)
WBC: 8 10*3/uL (ref 3.4–10.8)

## 2020-02-26 ENCOUNTER — Ambulatory Visit: Payer: PPO | Admitting: Gastroenterology

## 2020-02-26 ENCOUNTER — Other Ambulatory Visit (HOSPITAL_COMMUNITY): Payer: Self-pay

## 2020-02-26 ENCOUNTER — Encounter: Payer: Self-pay | Admitting: Gastroenterology

## 2020-02-26 VITALS — BP 118/68 | HR 74 | Ht 67.0 in | Wt 270.0 lb

## 2020-02-26 DIAGNOSIS — R1310 Dysphagia, unspecified: Secondary | ICD-10-CM

## 2020-02-26 DIAGNOSIS — Z800 Family history of malignant neoplasm of digestive organs: Secondary | ICD-10-CM | POA: Diagnosis not present

## 2020-02-26 DIAGNOSIS — K219 Gastro-esophageal reflux disease without esophagitis: Secondary | ICD-10-CM

## 2020-02-26 NOTE — Patient Instructions (Signed)
You have been scheduled for a modified barium swallow on 03/03/2020 at 11:30AM. Please arrive 15 minutes prior to your test for registration. You will go to [facility]  for your appointment. Should you need to cancel or reschedule your appointment, please contact [phone] [address] [place]) or [phone] [place]). _____________________________________________________________________ A Modified Barium Swallow Study, or MBS, is a special x-ray that is taken to check swallowing skills. It is carried out by a Stage manager and a Psychologist, clinical (SLP). During this test, yourmouth, throat, and esophagus, a muscular tube which connects your mouth to your stomach, is checked. The test will help you, your doctor, and the SLP plan what types of foods and liquids are easier for you to swallow. The SLP will also identify positions and ways to help you swallow more easily and safely. What will happen during an MBS? You will be taken to an x-ray room and seated comfortably. You will be asked to swallow small amounts of food and liquid mixed with barium. Barium is a liquid or paste that allows images of your mouth, throat and esophagus to be seen on x-ray. The x-ray captures moving images of the food you are swallowing as it travels from your mouth through your throat and into your esophagus. This test helps identify whether food or liquid is entering your lungs (aspiration). The test also shows which part of your mouth or throat lacks strength or coordination to move the food or liquid in the right direction. This test typically takes 30 minutes to 1 hour to complete. _______________________________________________________________________  We will put your colonoscopy recall in the system for November 2026.  I appreciate the opportunity to care for you. Owens Loffler, MD

## 2020-02-26 NOTE — Progress Notes (Signed)
She states review of pertinent gastrointestinal problems: 1.  Family history of colon cancer, his mother had colon cancer when she was 79.  Adenomatous polyp 2008, small; repeat colonoscopy 01/2011 for minor rectal bleeding found small HP, hemorrhoids;[name]  Colonoscopy June 2017 for dark stools Dr. Ardis Hughs found 3 subcentimeter polyps.  The examination was otherwise normal.  Pathology showed these were adenomatous polyps.  Colonoscopy 12/2019 2 subcentimeter polyps were removed.  Neither were adenomas.  Recall colonoscopy should be 12/2024. 2. EGD normal2008 for chest discomfort.   EGD June 2017 for dark stools, melena and it was completely normal.  EGD 12/2019 for mild iron deficiency anemia, dysphagia.  Very friable eschar in proximal stomach, similar in appearance to a Cameron's erosion however no hiatal hernia present.  Biopsy showed no sign of dysplasia or H. pylori infection.  He was started on a proton pump inhibitor once daily 3. Intermittent abd pains 01/2012 CT, [place], CBC, cmet all essentially normal. EGD Dr. Ardis Hughs 02/2012 showed mild gastritis, H. plori neg   HPI: This is a very pleasant 75 year old man whom I last saw November 2021 when I did a colonoscopy and upper endoscopy for him.  See those results all summarized above.  Since then he has been taking omeprazole 1 pill short before breakfast every morning.  He is still bothered by intermittent choking, intermittent dysphagia,.  Sometimes he will even choke on his own saliva.  He has gained 10 pounds in the past 6 months or so  No overt GI bleeding  Blood work last week showed hemoglobin 16, MCV 85.   ROS: complete GI ROS as described in HPI, all other review negative.  Constitutional:  No unintentional weight loss   Past Medical History:  Diagnosis Date  . Allergy   . Asthma   . CAD (coronary artery disease)   . CHF (congestive heart failure) ([place])   . Diabetes mellitus   . Fibromyalgia   . GERD (gastroesophageal reflux  disease)   . GI bleeding   . Gout   . Hyperlipidemia   . Hypertension   . Hypogonadism male   . Insomnia   . MRSA cellulitis   . Neuropathy   . Obesity   . Shortness of breath dyspnea    with exertion   . Sleep apnea    cpap- 14   . Wheezing    no asthma diagnosis    Past Surgical History:  Procedure Laterality Date  . BACK SURGERY    . BIOPSY  12/20/2019   Procedure: BIOPSY;  Surgeon: Milus Banister, MD;  Location: [facility];  Service: Endoscopy;;  . CARDIAC CATHETERIZATION    . COLONOSCOPY    . COLONOSCOPY WITH PROPOFOL N/A 12/20/2019   Procedure: COLONOSCOPY WITH PROPOFOL;  Surgeon: Milus Banister, MD;  Location: [facility];  Service: Endoscopy;  Laterality: N/A;  . ESOPHAGOGASTRODUODENOSCOPY (EGD) WITH PROPOFOL N/A 12/20/2019   Procedure: ESOPHAGOGASTRODUODENOSCOPY (EGD) WITH PROPOFOL;  Surgeon: Milus Banister, MD;  Location: [facility];  Service: Endoscopy;  Laterality: N/A;  . LEFT HEART CATH AND CORONARY ANGIOGRAPHY N/A 12/02/2016   Procedure: LEFT HEART CATH AND CORONARY ANGIOGRAPHY;  Surgeon: Jettie Booze, MD;  Location: [facility];  Service: Cardiovascular;  Laterality: N/A;  . LUMBAR LAMINECTOMY/DECOMPRESSION MICRODISCECTOMY N/A 01/02/2014   Procedure: CENTRAL DECOMPRESSION LUMBAR LAMINECTOMY L3-L4, L4-L5;  Surgeon: Tobi Bastos, MD;  Location: [facility];  Service: Orthopedics;  Laterality: N/A;  . neck fusion    . POLYPECTOMY  12/20/2019  Procedure: POLYPECTOMY;  Surgeon: Milus Banister, MD;  Location: [facility];  Service: Endoscopy;;    Current Outpatient Medications  Medication Sig Dispense Refill  . albuterol (ACCUNEB) 1.25 MG/3ML nebulizer solution Take 1 ampule by nebulization every 6 (six) hours as needed for wheezing.    [name] albuterol (PROVENTIL HFA;VENTOLIN HFA) 108 (90 Base) MCG/ACT inhaler Inhale 2 puffs into the lungs every 6 (six) hours as needed for wheezing or shortness of breath. 1 Inhaler 2  . atorvastatin  (LIPITOR) 40 MG tablet Take 1 tablet by mouth once daily (Patient taking differently: Take 40 mg by mouth every evening.) 90 tablet 4  . azelastine (OPTIVAR) 0.05 % ophthalmic solution Place 1 drop into both eyes in the morning and at bedtime.    . bisoprolol (ZEBETA) 5 MG tablet Take 1/2 (one-half) tablet by mouth once daily (Patient taking differently: Take 2.5 mg by mouth daily.) 45 tablet 0  . Budeson-Glycopyrrol-Formoterol (BREZTRI AEROSPHERE) 160-9-4.8 MCG/ACT AERO Inhale 2 puffs into the lungs in the morning and at bedtime. 42.8 g 4  . Cholecalciferol (VITAMIN D3) 125 MCG (5000 UT) TABS Take 5,000 Units by mouth daily.    . colchicine 0.6 MG tablet Take 1 tablet (0.6 mg total) by mouth daily. (Patient taking differently: Take 0.6 mg by mouth daily as needed (gout).) 20 tablet 1  . dapagliflozin propanediol (FARXIGA) 5 MG TABS tablet Take 1 tablet (5 mg total) by mouth daily. 90 tablet 3  . FEROSUL 325 (65 Fe) MG tablet Take 325 mg by mouth 3 (three) times daily.     . furosemide (LASIX) 40 MG tablet Take 40 mg by mouth daily as needed (fluid retention.).    [name] hydrALAZINE (APRESOLINE) 50 MG tablet Take 1 tablet (50 mg total) by mouth 3 (three) times daily. 90 tablet 3  . isosorbide dinitrate (ISORDIL) 10 MG tablet TAKE 1 TABLET BY MOUTH THREE TIMES DAILY 90 tablet 5  . mirabegron ER (MYRBETRIQ) 25 MG TB24 tablet Take 1 tablet (25 mg total) by mouth daily. 30 tablet 3  . omeprazole (PRILOSEC) 40 MG capsule Take 1 capsule (40 mg total) by mouth daily. Take 30-60 minutes prior to breakfast. 30 capsule 6  . polyethylene glycol (MIRALAX / GLYCOLAX) 17 g packet Take 17 g by mouth daily. (Patient taking differently: Take 17 g by mouth daily as needed (constipation.).) 30 each 0  . prednisoLONE acetate (PRED FORTE) 1 % ophthalmic suspension Place 1 drop into both eyes in the morning and at bedtime.    . RESTASIS 0.05 % ophthalmic emulsion Place 1 drop into both eyes in the morning and at bedtime.    .  sertraline (ZOLOFT) 100 MG tablet Take 1 tablet (100 mg total) by mouth daily. (Patient taking differently: Take 100 mg by mouth at bedtime.) 90 tablet 3  . tamsulosin (FLOMAX) 0.4 MG CAPS capsule Take 1 capsule (0.4 mg total) by mouth daily after supper. 90 capsule 3   No current facility-administered medications for this visit.    Allergies as of 02/26/2020 - Review Complete 02/26/2020  Allergen Reaction Noted  . Amlodipine besy-benazepril hcl Swelling and Other (See Comments) 06/24/2010  . Oxycodone Itching 07/11/2017  . Phenergan [promethazine hcl] Other (See Comments) 06/12/2012  . Hydrocodone Itching 02/13/2013    Family History  Problem Relation Age of Onset  . Colon cancer Mother   . Diabetes Father        siblings  . Heart disease Father        brother  .  Heart attack Father   . Kidney disease Sister   . Heart failure Sister   . Heart disease Brother   . Heart attack Son 30  . Drug abuse Sister   . Heart disease Brother   . Deep vein thrombosis Brother   . Colon polyps Neg Hx     Social History   Socioeconomic History  . Marital status: Married    Spouse name: Butch Penny  . Number of children: 3  . Years of education: 8  . Highest education level: GED or equivalent  Occupational History  . 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 Smoking status: Former Smoker    Quit date: 02/08/1969    Years since quitting: 51.0  . Smokeless tobacco: Former Systems developer    Quit date: Pharmacologist  . Vaping Use: Never used  Substance and Sexual Activity  . Alcohol use: No    Alcohol/week: 0.0 standard drinks  . Drug use: No  . Sexual activity: Not Currently  Other Topics Concern  . Not on file  Social History Narrative   Drinks caffeine tea occasionally    Social Determinants of Health   Financial Resource Strain: Low Risk   . Difficulty of Paying Living Expenses: Not hard at all  Food Insecurity: No Food Insecurity   . Worried About Charity fundraiser in the Last Year: Never true  . Ran Out of Food in the Last Year: Never true  Transportation Needs: No Transportation Needs  . Lack of Transportation (Medical): No  . Lack of Transportation (Non-Medical): No  Physical Activity: Inactive  . Days of Exercise per Week: 0 days  . Minutes of Exercise per Session: 0 min  Stress: No Stress Concern Present  . Feeling of Stress : Not at all  Social Connections: Socially Integrated  . Frequency of Communication with Friends and Family: More than three times a week  . Frequency of Social Gatherings with Friends and Family: More than three times a week  . Attends Religious Services: More than 4 times per year  . Active Member of Clubs or Organizations: Yes  . Attends Archivist Meetings: More than 4 times per year  . Marital Status: Married  Human resources officer Violence: Not At Risk  . Fear of Current or Ex-Partner: No  . Emotionally Abused: No  . Physically Abused: No  . Sexually Abused: No    Physical Exam: BP 118/68   Pulse 74   Ht _0  (1.702 m)   Wt 270 lb (122.5 kg)   BMI 42.29 kg/m  Constitutional: generally well-appearing Psychiatric: alert and oriented x3 Abdomen: soft, nontender, nondistended, no obvious ascites, no peritoneal signs, normal bowel sounds No peripheral edema noted in lower extremities  Assessment and plan: 75 y.o. male with family history colon cancer, personal history of adenomatous polyps, GERD, dysphagia  First with his family history of colon cancer and personal history of adenomatous polyps I recommend repeat colonoscopy 5 years from his last 1 which would make it November 2026.  We will make sure his recall is entered correctly.  Second he has choking, even on his own saliva, sometimes more of a dysphagia-like symptom.  I did not really see anything in his esophagus during his upper endoscopy to explain these symptoms.  Proton pump inhibitor once daily is not  really making a difference.  I am going to arrange for modified barium swallow evaluation with  speech therapy to understand his oral pharyngeal swallowing prior to any further testing on his esophagus.  Please see the "Patient Instructions" section for addition details about the plan.  Owens Loffler, MD [facility] 02/26/2020, 9:12 AM   Total time on date of encounter was 30 minutes (this included time spent preparing to see the patient reviewing records; obtaining and/or reviewing separately obtained history; performing a medically appropriate exam and/or evaluation; counseling and educating the patient and family if present; ordering medications, tests or procedures if applicable; and documenting clinical information in the health record).

## 2020-03-03 ENCOUNTER — Other Ambulatory Visit: Payer: Self-pay

## 2020-03-03 ENCOUNTER — Ambulatory Visit (HOSPITAL_COMMUNITY)
Admission: RE | Admit: 2020-03-03 | Discharge: 2020-03-03 | Disposition: A | Discharge status: Home / Self Care | Payer: PPO | Source: Ambulatory Visit | Attending: Gastroenterology | Admitting: Gastroenterology

## 2020-03-03 DIAGNOSIS — R059 Cough, unspecified: Secondary | ICD-10-CM | POA: Diagnosis not present

## 2020-03-03 DIAGNOSIS — R1310 Dysphagia, unspecified: Secondary | ICD-10-CM | POA: Insufficient documentation

## 2020-03-03 DIAGNOSIS — K219 Gastro-esophageal reflux disease without esophagitis: Secondary | ICD-10-CM | POA: Diagnosis not present

## 2020-03-03 NOTE — Progress Notes (Signed)
Modified Barium Swallow Progress Note  Patient Details  Name: Kenneth Fuller MRN: [id number] Date of Birth: April 29, 1945  Today's Date: 03/03/2020  Modified Barium Swallow completed.  Full report located under Chart Review in the Imaging Section.  Brief recommendations include the following:  Clinical Impression  Today's study revealed that pt appears to have a functional oropharyngeal swallow. Small amounts of penetration were noted with thin liquids (PAS 2) before swallow that the pt consistently cleared. It was also noted that the pt coughed after swallowing; however, this was not due to aspiration. Findings were not consistent with the pt's presenting symptoms. Recommend regular diet with thin liquids. No f/u with SLP at this time but defer to GI if symptoms continue to persist.    Swallow Evaluation Recommendations       SLP Diet Recommendations: Regular solids;Thin liquid   Liquid Administration via: Straw;Cup   Medication Administration: Whole meds with liquid   Supervision: Patient able to self feed   Compensations: Slow rate;Small sips/bites   Postural Changes: Seated upright at 90 degrees;Remain semi-upright after after feeds/meals (Comment)   Oral Care Recommendations: Oral care BID      Jeanine Luz., SLP Student 03/03/2020,1:13 PM

## 2020-03-07 ENCOUNTER — Other Ambulatory Visit: Payer: Self-pay

## 2020-03-07 ENCOUNTER — Ambulatory Visit: Payer: Self-pay | Admitting: Licensed Clinical Social Worker

## 2020-03-07 ENCOUNTER — Ambulatory Visit: Payer: PPO | Admitting: *Deleted

## 2020-03-07 ENCOUNTER — Telehealth: Payer: Self-pay

## 2020-03-07 DIAGNOSIS — I5042 Chronic combined systolic (congestive) and diastolic (congestive) heart failure: Secondary | ICD-10-CM

## 2020-03-07 DIAGNOSIS — N183 Chronic kidney disease, stage 3 unspecified: Secondary | ICD-10-CM

## 2020-03-07 DIAGNOSIS — I10 Essential (primary) hypertension: Secondary | ICD-10-CM

## 2020-03-07 DIAGNOSIS — J441 Chronic obstructive pulmonary disease with (acute) exacerbation: Secondary | ICD-10-CM

## 2020-03-07 DIAGNOSIS — I2510 Atherosclerotic heart disease of native coronary artery without angina pectoris: Secondary | ICD-10-CM

## 2020-03-07 DIAGNOSIS — F339 Major depressive disorder, recurrent, unspecified: Secondary | ICD-10-CM

## 2020-03-07 DIAGNOSIS — E1142 Type 2 diabetes mellitus with diabetic polyneuropathy: Secondary | ICD-10-CM

## 2020-03-07 MED ORDER — CLONIDINE HCL 0.1 MG PO TABS: 0.1000 mg | ORAL_TABLET | Freq: Every day | ORAL | 0 refills | Status: DC | PRN

## 2020-03-07 NOTE — Chronic Care Management (AMB) (Signed)
Chronic Care Management    Clinical Social Work Follow Up Note  03/07/2020 Name: Kenneth Fuller MRN: [id number] DOB: 1945-05-13  Kenneth Fuller is a 75 y.o. year old male who is a primary care patient of [name], [name], MD. The CCM team was consulted for assistance with Kenneth Fuller .   Review of patient status, including review of consultants reports, other relevant assessments, and collaboration with appropriate care team members and the patient's provider was performed as part of comprehensive patient evaluation and provision of chronic care management services.    SDOH (Social Determinants of Health) assessments performed: No; risk for physical inactivity; risk for stress; risk for depression; risk for tobacco use  Flowsheet Row Office Visit from 05/28/2019 in [place]  PHQ-9 Total Score 10     GAD 7 : Generalized Anxiety Score 05/28/2019 11/11/2017  Nervous, Anxious, on Edge 0 3  Control/stop worrying 2 3  Worry too much - different things 2 3  Trouble relaxing 2 3  Restless 0 3  Easily annoyed or irritable 2 3  Afraid - awful might happen 0 0  Total GAD 7 Score 8 18  Anxiety Difficulty Somewhat difficult Somewhat difficult    Outpatient Encounter Medications as of 03/07/2020  Medication Sig  . albuterol (ACCUNEB) 1.25 MG/3ML nebulizer solution Take 1 ampule by nebulization every 6 (six) hours as needed for wheezing.  [name] albuterol (PROVENTIL HFA;VENTOLIN HFA) 108 (90 Base) MCG/ACT inhaler Inhale 2 puffs into the lungs every 6 (six) hours as needed for wheezing or shortness of breath.  [name] atorvastatin (LIPITOR) 40 MG tablet Take 1 tablet by mouth once daily (Patient taking differently: Take 40 mg by mouth every evening.)  . azelastine (OPTIVAR) 0.05 % ophthalmic solution Place 1 drop into both eyes in the morning and at bedtime.  . bisoprolol (ZEBETA) 5 MG tablet Take 1/2 (one-half) tablet by mouth once daily (Patient taking differently:  Take 2.5 mg by mouth daily.)  . Budeson-Glycopyrrol-Formoterol (BREZTRI AEROSPHERE) 160-9-4.8 MCG/ACT AERO Inhale 2 puffs into the lungs in the morning and at bedtime.  . Cholecalciferol (VITAMIN D3) 125 MCG (5000 UT) TABS Take 5,000 Units by mouth daily.  . colchicine 0.6 MG tablet Take 1 tablet (0.6 mg total) by mouth daily. (Patient taking differently: Take 0.6 mg by mouth daily as needed (gout).)  . dapagliflozin propanediol (FARXIGA) 5 MG TABS tablet Take 1 tablet (5 mg total) by mouth daily.  . FEROSUL 325 (65 Fe) MG tablet Take 325 mg by mouth 3 (three) times daily.   . furosemide (LASIX) 40 MG tablet Take 40 mg by mouth daily as needed (fluid retention.).  [name] hydrALAZINE (APRESOLINE) 50 MG tablet Take 1 tablet (50 mg total) by mouth 3 (three) times daily.  . isosorbide dinitrate (ISORDIL) 10 MG tablet TAKE 1 TABLET BY MOUTH THREE TIMES DAILY  . mirabegron ER (MYRBETRIQ) 25 MG TB24 tablet Take 1 tablet (25 mg total) by mouth daily.  [name] omeprazole (PRILOSEC) 40 MG capsule Take 1 capsule (40 mg total) by mouth daily. Take 30-60 minutes prior to breakfast.  . polyethylene glycol (MIRALAX / GLYCOLAX) 17 g packet Take 17 g by mouth daily. (Patient taking differently: Take 17 g by mouth daily as needed (constipation.).)  . prednisoLONE acetate (PRED FORTE) 1 % ophthalmic suspension Place 1 drop into both eyes in the morning and at bedtime.  . RESTASIS 0.05 % ophthalmic emulsion Place 1 drop into both eyes in the morning and at bedtime.  [name]  sertraline (ZOLOFT) 100 MG tablet Take 1 tablet (100 mg total) by mouth daily. (Patient taking differently: Take 100 mg by mouth at bedtime.)  . tamsulosin (FLOMAX) 0.4 MG CAPS capsule Take 1 capsule (0.4 mg total) by mouth daily after supper.   No facility-administered encounter medications on file as of 03/07/2020.    Goals    .  Client has stress related to ongoing health issues and wishes to talk more about stress over current health issues (pt-stated)       Current Barriers:  [name] Mental Health Concerns  in patient with Chronic Diagnoses of CAD, DM Type II, Depression,recurrent, CKD, COPD and CHF .  Challenges related to managing health issues of client   Clinical Social Work Clinical Goal(s):  [name] Over the next 30 days, client will work with LCSW to address concerns related to health issues of client and client management of health issues faced  Interventions:  Talked with client about current needs of client Talked with client about death of his son recently Talked with client about adjusting to unexpected death of his son Talked with client about Kenneth Fuller and support related to grief issues through that agency Talked with client about family support related to grief issues faced Talked with client about monitoring blood pressure of client  Collaborated with RNCM about nursing needs of client  Talked with client about his support from friends at church Talked with Kenneth Fuller about strategies he could use to help him manage grief issues faced Encouraged Kenneth Fuller to call Kenneth Fuller as needed for nursing support   Patient Self Care Activities:  . Self administers medications as prescribed . Attends all scheduled provider appointments . Performs ADL's independently   Plan:  LCSW to call client in next 4 weeks to talk with client about stress issues related to his management of health issues faced Client to communicate with RNCM to discuss nursing needs of client Client to attend scheduled client medical appointments   Initial goal documentation    Follow Up Plan: LCSW to call client in next 4 weeks to talk with him about stress issues related to management of health issues faced  Kenneth Fuller.Kenneth Fuller MSW, LCSW Licensed Clinical Social Worker [facility] Care Management [phone]

## 2020-03-07 NOTE — Patient Instructions (Signed)
Visit Information   .  "I want to keep my blood pressure under control" (pt-stated)   Not on track     Current Barriers:  [name] Knowledge Deficits related to acute on chronic elevation of blood pressure . Chronic Disease Management support and education needs related to Hypertension  Nurse Case Manager Clinical Goal(s):  [name] Over the next 7 days, patient will talk with RN CCM regarding blood pressure management . Over the next 60 days, patient will take all medications as prescribed . Over the next 7 days, patient will check and record blood pressure twice daily and reach out to PCP with any readings outside of recommended range  Interventions:  . Chart reviewed including relevant office notes and lab results . Consulted by [facility] clinical staff regarding patient's call to them this morning about stress/grief reaction over the recent death of his son and blood pressure elevation of 186/110 this morning.  o PCP requested CCM support . Collaborated with Theadore Nan, LCSW who contacted patient and provided counseling and support r/t recent death of son and upcoming funeral tomorrow . Collaborated with PCP and discussed if clonidine PRN would be appropriate considering the circumstances and the tendency for his blood pressure to elevate when stressed o PCP agreed and ordered clonidine 0.70m daily PRN elevated blood pressure, #20 . Collaborated with [facility] clinical staff to have that order sent to [facility]. Talked with patient by phone about blood pressure management o Confirmed that BP was 186/110 this morning o Most recent reading was 158/90 o Patient agrees that it is likely stress related o Confirmed compliance with bisoprolol, hydralazine, and furosemide o Advised that Dr [name] has sent in a script for clonidine and to take one tablet for BP >160/90 o Advised to check and record blood pressure at least twice daily and 30 min after taking clonidine . Reinforced that someone is  available to talk with him after hours and on the weekend by calling PCP office at [phone] he has any questions or concerns regarding his BP or clonidine . RNCM will follow-up with patient over the next 5 days . [facility]  Patient Self Care Activities:  . Performs ADL's independently . Performs IADL's independently  Please see past updates related to this goal by clicking on the "Past Updates" button in the selected goal       Patient verbalizes understanding of instructions provided today and [place]   Plan:   The patient has been provided with contact information for the care management team and has been advised to call with any health related questions or concerns.   The care management team will reach out to the patient again over the next 5 days.   The patient will call [phone] advised to discuss any questions or concerns regarding elevated blood pressure or clonidine prescription.   KChong Sicilian BSN, RN-BC Embedded Chronic Care Manager [facility] Direct Dial: [phone]

## 2020-03-07 NOTE — Telephone Encounter (Signed)
Patient called office this morning stating that his son passed away this week and his BP has been all over the place. He states that it was 186/110 this morning and has came down but fluctuates. Spoke with Dr Building control surveyor and he requested that [facility] reach out to him. Spoke with [facility] this morning and they advised that they would reach out to the patient.

## 2020-03-07 NOTE — Chronic Care Management (AMB) (Signed)
Chronic Care Management   Follow Up Note   03/07/2020 Name: Kenneth Fuller MRN: [id number] DOB: February 22, 1945  Referred by: [name], Kenneth Kaufmann, MD Reason for referral : Chronic Care Management (RN follow up)   Kenneth Fuller is a 75 y.o. year old male who is a primary care patient of [name], Kenneth Kaufmann, MD. The CCM team was consulted for assistance with chronic disease management and care coordination needs.    Review of patient status, including review of consultants reports, relevant laboratory and other test results, and collaboration with appropriate care team members and the patient's provider was performed as part of comprehensive patient evaluation and provision of chronic care management services.    SDOH (Social Determinants of Health) assessments performed: No See Care Plan activities for detailed interventions related to [facility])     Outpatient Encounter Medications as of 03/07/2020  Medication Sig  . albuterol (ACCUNEB) 1.25 MG/3ML nebulizer solution Take 1 ampule by nebulization every 6 (six) hours as needed for wheezing.  [name] albuterol (PROVENTIL HFA;VENTOLIN HFA) 108 (90 Base) MCG/ACT inhaler Inhale 2 puffs into the lungs every 6 (six) hours as needed for wheezing or shortness of breath.  [name] atorvastatin (LIPITOR) 40 MG tablet Take 1 tablet by mouth once daily (Patient taking differently: Take 40 mg by mouth every evening.)  . azelastine (OPTIVAR) 0.05 % ophthalmic solution Place 1 drop into both eyes in the morning and at bedtime.  . bisoprolol (ZEBETA) 5 MG tablet Take 1/2 (one-half) tablet by mouth once daily (Patient taking differently: Take 2.5 mg by mouth daily.)  . Budeson-Glycopyrrol-Formoterol (BREZTRI AEROSPHERE) 160-9-4.8 MCG/ACT AERO Inhale 2 puffs into the lungs in the morning and at bedtime.  . Cholecalciferol (VITAMIN D3) 125 MCG (5000 UT) TABS Take 5,000 Units by mouth daily.  . cloNIDine (CATAPRES) 0.1 MG tablet Take 1 tablet (0.1 mg total) by mouth daily  as needed.  . colchicine 0.6 MG tablet Take 1 tablet (0.6 mg total) by mouth daily. (Patient taking differently: Take 0.6 mg by mouth daily as needed (gout).)  . dapagliflozin propanediol (FARXIGA) 5 MG TABS tablet Take 1 tablet (5 mg total) by mouth daily.  . FEROSUL 325 (65 Fe) MG tablet Take 325 mg by mouth 3 (three) times daily.   . furosemide (LASIX) 40 MG tablet Take 40 mg by mouth daily as needed (fluid retention.).  [name] hydrALAZINE (APRESOLINE) 50 MG tablet Take 1 tablet (50 mg total) by mouth 3 (three) times daily.  . isosorbide dinitrate (ISORDIL) 10 MG tablet TAKE 1 TABLET BY MOUTH THREE TIMES DAILY  . mirabegron ER (MYRBETRIQ) 25 MG TB24 tablet Take 1 tablet (25 mg total) by mouth daily.  [name] omeprazole (PRILOSEC) 40 MG capsule Take 1 capsule (40 mg total) by mouth daily. Take 30-60 minutes prior to breakfast.  . polyethylene glycol (MIRALAX / GLYCOLAX) 17 g packet Take 17 g by mouth daily. (Patient taking differently: Take 17 g by mouth daily as needed (constipation.).)  . prednisoLONE acetate (PRED FORTE) 1 % ophthalmic suspension Place 1 drop into both eyes in the morning and at bedtime.  . RESTASIS 0.05 % ophthalmic emulsion Place 1 drop into both eyes in the morning and at bedtime.  . sertraline (ZOLOFT) 100 MG tablet Take 1 tablet (100 mg total) by mouth daily. (Patient taking differently: Take 100 mg by mouth at bedtime.)  . tamsulosin (FLOMAX) 0.4 MG CAPS capsule Take 1 capsule (0.4 mg total) by mouth daily after supper.   No facility-administered encounter medications  on file as of 03/07/2020.     Care Plan   .  "I want to keep my blood pressure under control" (pt-stated)   Not on track     Current Barriers:  [name] Knowledge Deficits related to acute on chronic elevation of blood pressure . Chronic Disease Management support and education needs related to Hypertension in a patient with heart failure  Nurse Case Manager Clinical Goal(s):  [name] Over the next 7 days, patient will talk  with RN CCM regarding blood pressure management . Over the next 60 days, patient will take all medications as prescribed . Over the next 7 days, patient will check and record blood pressure twice daily and reach out to PCP with any readings outside of recommended range  Interventions:  . Chart reviewed including relevant office notes and lab results . Consulted by [facility] clinical staff regarding patient's call to them this morning about stress/grief reaction over the recent death of his son and blood pressure elevation of 186/110 this morning.  o PCP requested CCM support . Collaborated with Theadore Nan, LCSW who contacted patient and provided counseling and support r/t recent death of son and upcoming funeral tomorrow . Collaborated with PCP and discussed if clonidine PRN would be appropriate considering the circumstances and the tendency for his blood pressure to elevate when stressed o PCP agreed and ordered clonidine 0.57m daily PRN elevated blood pressure, #20 . Collaborated with [facility] clinical staff [place]. Talked with patient by phone about blood pressure management o Confirmed that BP was 186/110 this morning o Most recent reading was 158/90 o Patient agrees that it is likely stress related o Confirmed compliance with bisoprolol, hydralazine, and furosemide o Advised that Dr [name] has sent in a script for clonidine and to take one tablet for BP >160/90 o Advised to check and record blood pressure at least twice daily and 30 min after taking clonidine . Reinforced that someone is available to talk with him after hours and on the weekend by calling PCP office at [phone] he has any questions or concerns regarding his BP or clonidine . RNCM will follow-up with patient over the next 5 days . [facility]  Patient Self Care Activities:  . Performs ADL's independently . Performs IADL's  independently  Please see past updates related to this goal by clicking on the "Past Updates" button in the selected goal         Plan:   The patient has been provided with contact information for the care management team and has been advised to call with any health related questions or concerns.   The care management team will reach out to the patient again over the next 5 days.   The patient will call [phone] advised to discuss any questions or concerns regarding elevated blood pressure or clonidine prescription.   KChong Sicilian BSN, RN-BC Embedded Chronic Care Manager [facility] Direct Dial: [phone]

## 2020-03-07 NOTE — Patient Instructions (Addendum)
Licensed Clinical Social Worker Visit Information  Goals we discussed today:  .  Client has stress related to ongoing health issues and wishes to talk more about stress over current health issues (pt-stated)        Current Barriers:   Mental Health Concerns  in patient with Chronic Diagnoses of CAD, DM Type II, Depression,recurrent, CKD, COPD and CHF   Challenges related to managing health issues of client   Clinical Social Work Clinical Goal(s):   Over the next 30 days, client will work with LCSW to address concerns related to health issues of client and client management of health issues faced  Interventions:  Talked with client about current needs of client Talked with client about death of his son recently Talked with client about adjusting to unexpected death of his son Talked with client about [facility] and support related to grief issues through that agency Talked with client about family support related to grief issues faced Talked with client about monitoring blood pressure of client  Collaborated with RNCM about nursing needs of client  Talked with client about his support from friends at church Talked with Rupert about strategies he could use to help him manage grief issues faced Encouraged [name] to call [facility] Triage Nurse as needed for nursing support   Patient Self Care Activities:   Self administers medications as prescribed  Attends all scheduled provider appointments  Performs ADL's independently   Plan:  LCSW to call client in next 4 weeks to talk with client about stress issues related to his management of health issues faced Client to communicate with RNCM to discuss nursing needs of client Client to attend scheduled client medical appointments   Initial goal documentation    Follow Up Plan:LCSW to call client in next 4 weeks to talk with him about stress issues related to management of health issues faced  Materials  Provided: No  The patient verbalized understanding of instructions provided today and declined a print copy of patient instruction materials.   [name].Trevonne Nyland MSW, LCSW Licensed Clinical Social Worker [facility] Care Management [phone]

## 2020-03-10 ENCOUNTER — Encounter: Payer: Self-pay | Admitting: Cardiovascular Disease

## 2020-03-10 NOTE — Telephone Encounter (Signed)
error 

## 2020-03-11 ENCOUNTER — Other Ambulatory Visit: Payer: Self-pay

## 2020-03-11 DIAGNOSIS — R1310 Dysphagia, unspecified: Secondary | ICD-10-CM

## 2020-03-12 ENCOUNTER — Telehealth: Payer: Self-pay

## 2020-03-12 ENCOUNTER — Other Ambulatory Visit: Payer: PPO

## 2020-03-12 DIAGNOSIS — Z20822 Contact with and (suspected) exposure to covid-19: Secondary | ICD-10-CM

## 2020-03-12 NOTE — Telephone Encounter (Signed)
Patient reports that he has been taking Clonidine daily but his blood pressure is still continuing to be elevated.  His highest reading today has been 180/118 and lowest has been 164/100.  Patient is unable to come in to the office to be seen because he is having Covid symptoms and is being tested in our Covid after hours testing tonight.  Please advise.

## 2020-03-13 ENCOUNTER — Ambulatory Visit: Payer: PPO | Admitting: *Deleted

## 2020-03-13 ENCOUNTER — Ambulatory Visit (INDEPENDENT_AMBULATORY_CARE_PROVIDER_SITE_OTHER): Payer: PPO | Admitting: Licensed Clinical Social Worker

## 2020-03-13 DIAGNOSIS — F418 Other specified anxiety disorders: Secondary | ICD-10-CM

## 2020-03-13 DIAGNOSIS — J410 Simple chronic bronchitis: Secondary | ICD-10-CM | POA: Diagnosis not present

## 2020-03-13 DIAGNOSIS — I2510 Atherosclerotic heart disease of native coronary artery without angina pectoris: Secondary | ICD-10-CM | POA: Diagnosis not present

## 2020-03-13 DIAGNOSIS — J441 Chronic obstructive pulmonary disease with (acute) exacerbation: Secondary | ICD-10-CM | POA: Diagnosis not present

## 2020-03-13 DIAGNOSIS — I10 Essential (primary) hypertension: Secondary | ICD-10-CM

## 2020-03-13 DIAGNOSIS — I5042 Chronic combined systolic (congestive) and diastolic (congestive) heart failure: Secondary | ICD-10-CM

## 2020-03-13 DIAGNOSIS — E1142 Type 2 diabetes mellitus with diabetic polyneuropathy: Secondary | ICD-10-CM | POA: Diagnosis not present

## 2020-03-13 DIAGNOSIS — N1832 Chronic kidney disease, stage 3b: Secondary | ICD-10-CM

## 2020-03-13 LAB — NOVEL CORONAVIRUS, NAA: SARS-CoV-2, NAA: DETECTED — AB

## 2020-03-13 LAB — SARS-COV-2, NAA 2 DAY TAT

## 2020-03-13 NOTE — Chronic Care Management (AMB) (Signed)
Chronic Care Management    Clinical Social Work Follow Up Note  03/13/2020 Name: Kenneth Fuller MRN: [id number] DOB: July 18, 1945  Kenneth Fuller is a 75 y.o. year old male who is a primary care patient of [name], [name], MD. The CCM team was consulted for assistance with [name] .   Review of patient status, including review of consultants reports, other relevant assessments, and collaboration with appropriate care team members and the patient's provider was performed as part of comprehensive patient evaluation and provision of chronic care management services.    SDOH (Social Determinants of Health) assessments performed: No; risk for physical inactivity; risk for stress; risk for depression; risk for tobacco use; risk for financial strain  [employer] Visit from 05/28/2019 in [facility]  PHQ-9 Total Score 10      GAD 7 : Generalized Anxiety Score 05/28/2019 11/11/2017  Nervous, Anxious, on Edge 0 3  Control/stop worrying 2 3  Worry too much - different things 2 3  Trouble relaxing 2 3  Restless 0 3  Easily annoyed or irritable 2 3  Afraid - awful might happen 0 0  Total GAD 7 Score 8 18  Anxiety Difficulty Somewhat difficult Somewhat difficult    Outpatient Encounter Medications as of 03/13/2020  Medication Sig  . albuterol (ACCUNEB) 1.25 MG/3ML nebulizer solution Take 1 ampule by nebulization every 6 (six) hours as needed for wheezing.  [name] albuterol (PROVENTIL HFA;VENTOLIN HFA) 108 (90 Base) MCG/ACT inhaler Inhale 2 puffs into the lungs every 6 (six) hours as needed for wheezing or shortness of breath.  [name] atorvastatin (LIPITOR) 40 MG tablet Take 1 tablet by mouth once daily (Patient taking differently: Take 40 mg by mouth every evening.)  . azelastine (OPTIVAR) 0.05 % ophthalmic solution Place 1 drop into both eyes in the morning and at bedtime.  . bisoprolol (ZEBETA) 5 MG tablet Take 1/2 (one-half) tablet by mouth once daily  (Patient taking differently: Take 2.5 mg by mouth daily.)  . Budeson-Glycopyrrol-Formoterol (BREZTRI AEROSPHERE) 160-9-4.8 MCG/ACT AERO Inhale 2 puffs into the lungs in the morning and at bedtime.  . Cholecalciferol (VITAMIN D3) 125 MCG (5000 UT) TABS Take 5,000 Units by mouth daily.  . cloNIDine (CATAPRES) 0.1 MG tablet Take 1 tablet (0.1 mg total) by mouth daily as needed.  . colchicine 0.6 MG tablet Take 1 tablet (0.6 mg total) by mouth daily. (Patient taking differently: Take 0.6 mg by mouth daily as needed (gout).)  . dapagliflozin propanediol (FARXIGA) 5 MG TABS tablet Take 1 tablet (5 mg total) by mouth daily.  . FEROSUL 325 (65 Fe) MG tablet Take 325 mg by mouth 3 (three) times daily.   . furosemide (LASIX) 40 MG tablet Take 40 mg by mouth daily as needed (fluid retention.).  [name] hydrALAZINE (APRESOLINE) 50 MG tablet Take 1 tablet (50 mg total) by mouth 3 (three) times daily.  . isosorbide dinitrate (ISORDIL) 10 MG tablet TAKE 1 TABLET BY MOUTH THREE TIMES DAILY  . mirabegron ER (MYRBETRIQ) 25 MG TB24 tablet Take 1 tablet (25 mg total) by mouth daily.  [name] omeprazole (PRILOSEC) 40 MG capsule Take 1 capsule (40 mg total) by mouth daily. Take 30-60 minutes prior to breakfast.  . polyethylene glycol (MIRALAX / GLYCOLAX) 17 g packet Take 17 g by mouth daily. (Patient taking differently: Take 17 g by mouth daily as needed (constipation.).)  . prednisoLONE acetate (PRED FORTE) 1 % ophthalmic suspension Place 1 drop into both eyes in the morning  and at bedtime.  . RESTASIS 0.05 % ophthalmic emulsion Place 1 drop into both eyes in the morning and at bedtime.  . sertraline (ZOLOFT) 100 MG tablet Take 1 tablet (100 mg total) by mouth daily. (Patient taking differently: Take 100 mg by mouth at bedtime.)  . tamsulosin (FLOMAX) 0.4 MG CAPS capsule Take 1 capsule (0.4 mg total) by mouth daily after supper.   No facility-administered encounter medications on file as of 03/13/2020.    Goals    .  Client has  stress related to ongoing health issues and wishes to talk more about stress over current health issues (pt-stated)      Current Barriers:  [name] Mental Health Concerns  in patient with Chronic Diagnoses of CAD, DM Type II, Depression,recurrent, CKD, COPD and CHF .  Challenges related to managing health issues of client   Clinical Social Work Clinical Goal(s):  [name] Over the next 30 days, client will work with LCSW to address concerns related to health issues of client and client management of health issues faced  Interventions:  Talked with client about grief issues of client Talked with client about sleeping issues of client Talked with client about family support Talked with client about blood pressure readings and concerns of patient  related to blood pressure readings Talked with client about his phone appointment tomorrow with Dr. Warrick Parisian Collaborated with RNCM to discuss nursing needs of client  Talked with client  about challenges with shortness of breath (he has inhaler to use as needed) Talked with client about his use of C-pap and difficulty in using CPap. Provided counseling support for client   Patient Self Care Activities:  . Self administers medications as prescribed . Attends all scheduled provider appointments . Performs ADL's independently   Plan:  LCSW to call client in next 4 weeks to talk with client about stress issues related to his management of health issues faced Client to communicate with RNCM to discuss nursing needs of client Client to attend scheduled client medical appointments   Initial goal documentation    Follow Up Plan: LCSW to call client in next 4 weeks to talk with him about stress issues related to management of health issues faced  [name].Derris Millan MSW, LCSW Licensed Clinical Social Worker [facility] Care Management [phone]

## 2020-03-13 NOTE — Telephone Encounter (Signed)
Have him schedule virtual appointment and we can discuss options but for now he can take the clonidine twice daily, once in the morning once in the evening.

## 2020-03-13 NOTE — Chronic Care Management (AMB) (Signed)
Chronic Care Management   Follow Up Note   03/13/2020 Name: Kenneth Fuller MRN: [id number] DOB: 03-25-1945  Referred by: [name], Kenneth Kaufmann, MD Reason for referral : Chronic Care Management (RN follow up)   Kenneth Fuller is a 75 y.o. year old male who is a primary care patient of [name], Kenneth Kaufmann, MD. The CCM team was consulted for assistance with chronic disease management and care coordination needs.    Review of patient status, including review of consultants reports, relevant laboratory and other test results, and collaboration with appropriate care team members and the patient's provider was performed as part of comprehensive patient evaluation and provision of chronic care management services.    SDOH (Social Determinants of Health) assessments performed: No See Care Plan activities for detailed interventions related to [facility]     Outpatient Encounter Medications as of 03/13/2020  Medication Sig  . albuterol (ACCUNEB) 1.25 MG/3ML nebulizer solution Take 1 ampule by nebulization every 6 (six) hours as needed for wheezing.  [name] albuterol (PROVENTIL HFA;VENTOLIN HFA) 108 (90 Base) MCG/ACT inhaler Inhale 2 puffs into the lungs every 6 (six) hours as needed for wheezing or shortness of breath.  [name] atorvastatin (LIPITOR) 40 MG tablet Take 1 tablet by mouth once daily (Patient taking differently: Take 40 mg by mouth every evening.)  . azelastine (OPTIVAR) 0.05 % ophthalmic solution Place 1 drop into both eyes in the morning and at bedtime.  . bisoprolol (ZEBETA) 5 MG tablet Take 1/2 (one-half) tablet by mouth once daily (Patient taking differently: Take 2.5 mg by mouth daily.)  . Budeson-Glycopyrrol-Formoterol (BREZTRI AEROSPHERE) 160-9-4.8 MCG/ACT AERO Inhale 2 puffs into the lungs in the morning and at bedtime.  . Cholecalciferol (VITAMIN D3) 125 MCG (5000 UT) TABS Take 5,000 Units by mouth daily.  . cloNIDine (CATAPRES) 0.1 MG tablet Take 1 tablet (0.1 mg total) by mouth daily as  needed.  . colchicine 0.6 MG tablet Take 1 tablet (0.6 mg total) by mouth daily. (Patient taking differently: Take 0.6 mg by mouth daily as needed (gout).)  . dapagliflozin propanediol (FARXIGA) 5 MG TABS tablet Take 1 tablet (5 mg total) by mouth daily.  . FEROSUL 325 (65 Fe) MG tablet Take 325 mg by mouth 3 (three) times daily.   . furosemide (LASIX) 40 MG tablet Take 40 mg by mouth daily as needed (fluid retention.).  [name] hydrALAZINE (APRESOLINE) 50 MG tablet Take 1 tablet (50 mg total) by mouth 3 (three) times daily.  . isosorbide dinitrate (ISORDIL) 10 MG tablet TAKE 1 TABLET BY MOUTH THREE TIMES DAILY  . mirabegron ER (MYRBETRIQ) 25 MG TB24 tablet Take 1 tablet (25 mg total) by mouth daily.  [name] omeprazole (PRILOSEC) 40 MG capsule Take 1 capsule (40 mg total) by mouth daily. Take 30-60 minutes prior to breakfast.  . polyethylene glycol (MIRALAX / GLYCOLAX) 17 g packet Take 17 g by mouth daily. (Patient taking differently: Take 17 g by mouth daily as needed (constipation.).)  . prednisoLONE acetate (PRED FORTE) 1 % ophthalmic suspension Place 1 drop into both eyes in the morning and at bedtime.  . RESTASIS 0.05 % ophthalmic emulsion Place 1 drop into both eyes in the morning and at bedtime.  . sertraline (ZOLOFT) 100 MG tablet Take 1 tablet (100 mg total) by mouth daily. (Patient taking differently: Take 100 mg by mouth at bedtime.)  . tamsulosin (FLOMAX) 0.4 MG CAPS capsule Take 1 capsule (0.4 mg total) by mouth daily after supper.   No facility-administered encounter medications  on file as of 03/13/2020.      Care Plan   .  "I want to keep my blood pressure under control" (pt-stated)   Not on track     Current Barriers:  [name] Knowledge Deficits related to acute on chronic elevation of blood pressure . Chronic Disease Management support and education needs related to Hypertension in a patient with heart failure  Nurse Case Manager Clinical Goal(s):  [name] Over the next 7 days, patient will talk  with RN CCM regarding blood pressure management . Over the next 60 days, patient will take all medications as prescribed . Over the next 7 days, patient will check and record blood pressure twice daily and PRN and reach out to PCP with any readings outside of recommended range  Interventions:  . Chart reviewed including relevant office notes and lab results . Consulted by LCSW regarding elevated blood pressure . Discussed home blood pressure readings o Systolic 280-034 and Diastolic in the 917H today . Discussed Clonidine .21m o patient was taking 1 per day but increased to 2 per day per PCP due to persistent HTN . Encouraged patient to taking and recording blood pressure twice a day and PRN . Call PCP with any persistently elevated readings above recommended range . Reviewed appointment with PCP for 03/13/20 . [facility]  Patient Self Care Activities:  . Performs ADL's independently . Performs IADL's independently     .  COPD Symptom Management (pt-stated)   Not on track     Current Barriers:  . Chronic Disease Management support and education needs related to COPD & shortness of breath  Nurse Case Manager Clinical Goal(s):  .[name] Current Barriers:  . Chronic Disease Management support and education needs related to COPD management  Nurse Case Manager Clinical Goal(s):  .Marland KitchenOver the next 7 days, patient will have a visit with PCP regarding increased shortness of breath and cough . Over the next 15 days, [facility] . Over the next 30 days, patient will talk with RN Care Manager regarding self-management of COPD  Interventions:  . 1:1 collaboration with [name], JFransisca Kaufmann MD regarding development and update of comprehensive plan of care as evidenced by provider attestation and co-signature . Inter-disciplinary care team collaboration (see longitudinal  plan of care) . Evaluation of current treatment plan related to COPD and patient's adherence to plan as established by provider. . Chart reviewed including relevant office notes and lab results o Advised patient Covid results are not back yet . Discussed current symptoms o Primarily cough and shortness of breath . Discussed home O2 testing o Lowest 89% and mostly in low 90s% o He is prescribed oxygen o Using used O2 concentrator and it isn't working properly  o Propping up at night . Reviewed upcoming telephone appt with Dr [name] tomorrow o Appt notes added that patient needs O2 Concentrator DME order . Reviewed and discussed medications o Provided telephone number for DDelorise Jacksonwith the [phone] follow-up on his [facility]. Provided telephone number for RFallonand encouraged to reach out as needed . Call PCP with any new or worsening symptoms or if O2 is staying below 90 . Reminded that someone is on call after hours and on the weekend . Seek emergency medical care as needed  Patient Goals/Self-Care Activities Over the next  30 days, patient will: . Complete application for Rx Assistance for [facility] through [facility] . Keep appt with PCP tomorrow for  . Seek emergency medical attention if needed        Follow Up Plan:  . Telephone follow up appointment with care management team member scheduled for: 03/18/20 with RN Care Manager . The patient has been provided with contact information for the care management team and has been advised to call with any health related questions or concerns.  . Next PCP appointment scheduled for: 03/14/20 wit Dr [name]  [name], BSN, RN-BC [facility] / [place] Management Direct Dial: [phone]

## 2020-03-13 NOTE — Patient Instructions (Addendum)
Licensed Clinical Social Worker Visit Information  Goals we discussed today:  .  Client has stress related to ongoing health issues and wishes to talk more about stress over current health issues (pt-stated)        Current Barriers:   Mental Health Concerns  in patient with Chronic Diagnoses of CAD, DM Type II, Depression,recurrent, CKD, COPD and CHF   Challenges related to managing health issues of client   Clinical Social Work Clinical Goal(s):   Over the next 30 days, client will work with LCSW to address concerns related to health issues of client and client management of health issues faced  Interventions:  Talked with client about grief issues of client Talked with client about sleeping issues of client Talked with client about family support Talked with client about blood pressure readings and concerns of patient  related to blood pressure readings Talked with client about his phone appointment tomorrow with Dr. Warrick Parisian Collaborated with RNCM to discuss nursing needs of client  Talked with client  about challenges with shortness of breath (he has inhaler to use as needed) Talked with client about his use of C-pap and difficulty in using CPap. Provided counseling support for client   Patient Self Care Activities:   Self administers medications as prescribed  Attends all scheduled provider appointments  Performs ADL's independently   Plan:  LCSW to call client in next 4 weeks to talk with client about stress issues related to his management of health issues faced Client to communicate with RNCM to discuss nursing needs of client Client to attend scheduled client medical appointments   Initial goal documentation    Follow Up Plan: LCSW to call client in next 4 weeks to talk with him about stress issues related to management of health issues faced  Materials Provided: No  The patient verbalized understanding of instructions provided today and  declined a print copy of patient instruction materials.   [name].Kacey Vicuna MSW, LCSW Licensed Clinical Social Worker [facility] [phone]

## 2020-03-13 NOTE — Patient Instructions (Signed)
Visit Information  PATIENT GOALS: Goals Addressed              This Visit's Progress     Patient Stated   .  "I want to keep my blood pressure under control" (pt-stated)   Not on track     Current Barriers:  [name] Knowledge Deficits related to acute on chronic elevation of blood pressure . Chronic Disease Management support and education needs related to Hypertension in a patient with heart failure  Nurse Case Manager Clinical Goal(s):  [name] Over the next 7 days, patient will talk with RN CCM regarding blood pressure management . Over the next 60 days, patient will take all medications as prescribed . Over the next 7 days, patient will check and record blood pressure twice daily and PRN and reach out to PCP with any readings outside of recommended range  Interventions:  . Chart reviewed including relevant office notes and lab results . Consulted by LCSW regarding elevated blood pressure . Discussed home blood pressure readings o Systolic 440-102 and Diastolic in the 725D today . Discussed Clonidine .50m o patient was taking 1 per day but increased to 2 per day per PCP due to persistent HTN . Encouraged patient to taking and recording blood pressure twice a day and PRN . Call PCP with any persistently elevated readings above recommended range . Reviewed appointment with PCP for 03/13/20 . [facility]our[facility] patient to [facility]  Patient Self Care Activities:  . Performs ADL's independently . Performs IADL's independently  Please see past updates related to this goal by clicking on the "Past Updates" button in the selected goal      .  COPD Symptom Management (pt-stated)   Not on track     Current Barriers:  . Chronic Disease Management support and education needs related to COPD & shortness of breath  Nurse Case Manager Clinical Goal(s):  .[name] Current Barriers:  . Chronic Disease Management support and education needs related to COPD management  Nurse Case Manager  Clinical Goal(s):  .Marland KitchenOver the next 7 days, patient will have a visit with PCP regarding increased shortness of breath and cough . Over the next 15 days, patient [facility] . Over the next 30 days, patient will talk with RN Care Manager regarding self-management of COPD  Interventions:  . 1:1 collaboration with [name], JFransisca Kaufmann MD regarding development and update of comprehensive plan of care as evidenced by provider attestation and co-signature . Inter-disciplinary care team collaboration (see longitudinal plan of care) . Evaluation of current treatment plan related to COPD and patient's adherence to plan as established by provider. . Chart reviewed including relevant office notes and lab results o Advised patient Covid results are not back yet . Discussed current symptoms o Primarily cough and shortness of breath . Discussed home O2 testing o Lowest 89% and mostly in low 90s% o He is prescribed oxygen o Using used O2 concentrator and it isn't working properly  o Propping up at night . Reviewed upcoming telephone appt with Dr [name] tomorrow o Appt notes added that patient needs O2 Concentrator DME order . Reviewed and discussed medications o Provided telephone number for DDelorise Jacksonwith the [phone] follow-up on his application for Rx assistance [facility]. Provided telephone number for R[place]ersand encouraged to reach out as needed . Call PCP with any new  or worsening symptoms or if O2 is staying below 90 . Reminded that someone is on call after hours and on the weekend . Seek emergency medical care as needed  Patient Goals/Self-Care Activities Over the next 30 days, patient will: . Complete application for Rx Assistance for [facility] through [facility] . Keep appt with PCP tomorrow for  . Seek emergency  medical attention if needed  Follow Up Plan:  . Telephone follow up appointment with care management team member scheduled for: 03/18/20 with RN Care Manager . The patient has been provided with contact information for the care management team and has been advised to call with any health related questions or concerns.  . Next PCP appointment scheduled for: 03/14/20 wit Dr [name]           Patient verbalizes understanding of instructions provided today and agrees to view in [place].   Follow Up Plan:  . Telephone follow up appointment with care management team member scheduled for: 03/18/20 with RN Care Manager . The patient has been provided with contact information for the care management team and has been advised to call with any health related questions or concerns.  . Next PCP appointment scheduled for: 03/14/20 wit Dr [name]  [name], BSN, RN-BC [place] [facility] [place] Management Direct Dial: [phone]

## 2020-03-13 NOTE — Telephone Encounter (Signed)
Pt is scheduled for 03/14/20 at 10:40. Telephone visit. Pt knows Dr. Warrick Parisian could call at anytime. Pt made aware to take one clonidine in the morning and one in the evening.

## 2020-03-14 ENCOUNTER — Other Ambulatory Visit: Payer: Self-pay | Admitting: Family Medicine

## 2020-03-14 ENCOUNTER — Telehealth: Payer: Self-pay | Admitting: Infectious Diseases

## 2020-03-14 ENCOUNTER — Ambulatory Visit (INDEPENDENT_AMBULATORY_CARE_PROVIDER_SITE_OTHER): Payer: PPO | Admitting: Family Medicine

## 2020-03-14 ENCOUNTER — Encounter: Payer: Self-pay | Admitting: Family Medicine

## 2020-03-14 DIAGNOSIS — I5042 Chronic combined systolic (congestive) and diastolic (congestive) heart failure: Secondary | ICD-10-CM

## 2020-03-14 DIAGNOSIS — U071 COVID-19: Secondary | ICD-10-CM

## 2020-03-14 DIAGNOSIS — I10 Essential (primary) hypertension: Secondary | ICD-10-CM

## 2020-03-14 MED ORDER — HYDRALAZINE HCL 50 MG PO TABS: 50.0000 mg | ORAL_TABLET | Freq: Three times a day (TID) | ORAL | 3 refills | Status: DC

## 2020-03-14 MED ORDER — AMOXICILLIN-POT CLAVULANATE 875-125 MG PO TABS: 1.0000 | ORAL_TABLET | Freq: Two times a day (BID) | ORAL | 0 refills | Status: DC

## 2020-03-14 MED ORDER — PREDNISONE 20 MG PO TABS: ORAL_TABLET | ORAL | 0 refills | Status: DC

## 2020-03-14 NOTE — Progress Notes (Signed)
Virtual Visit via telephone Note  I connected with Stanford Strauch Glander on 03/14/20 at 1135 by telephone and verified that I am speaking with the correct person using two identifiers. [name] Parrales is currently located at home and patient are currently with her during visit. The provider, [name] Naol Ontiveros, MD is located in their office at time of visit.  Call ended at 1149  I discussed the limitations, risks, security and privacy concerns of performing an evaluation and management service by telephone and the availability of in person appointments. I also discussed with the patient that there may be a patient responsible charge related to this service. The patient expressed understanding and agreed to proceed.   History and Present Illness: Hypertension Patient is currently on albuterol and breztri, and their blood pressure today is 165/86. Patient denies any lightheadedness or dizziness. Patient denies headaches, blurred vision, chest pains, shortness of breath, or weakness. Denies any side effects from medication and is content with current medication.  Patients blood pressure is running high and added clonidine and had to double up on clonidine.  2 days ago 180/106, today 165/86.  Yesterday  Started 2 a day clonidine.   His oxygen went down to 89 SpO2 the other day.  He is still recovering from covid and has no more fevers or chills.  He is using nebulizer and inhalers. He is coughing wheezing and has chest tightness. He feels like he is shortness.  No diagnosis found.  Outpatient Encounter Medications as of 03/14/2020  Medication Sig  . albuterol (ACCUNEB) 1.25 MG/3ML nebulizer solution Take 1 ampule by nebulization every 6 (six) hours as needed for wheezing.  [name] albuterol (PROVENTIL HFA;VENTOLIN HFA) 108 (90 Base) MCG/ACT inhaler Inhale 2 puffs into the lungs every 6 (six) hours as needed for wheezing or shortness of breath.  [name] atorvastatin (LIPITOR) 40 MG tablet Take 1 tablet by  mouth once daily (Patient taking differently: Take 40 mg by mouth every evening.)  . azelastine (OPTIVAR) 0.05 % ophthalmic solution Place 1 drop into both eyes in the morning and at bedtime.  . bisoprolol (ZEBETA) 5 MG tablet Take 1/2 (one-half) tablet by mouth once daily (Patient taking differently: Take 2.5 mg by mouth daily.)  . Budeson-Glycopyrrol-Formoterol (BREZTRI AEROSPHERE) 160-9-4.8 MCG/ACT AERO Inhale 2 puffs into the lungs in the morning and at bedtime.  . Cholecalciferol (VITAMIN D3) 125 MCG (5000 UT) TABS Take 5,000 Units by mouth daily.  . cloNIDine (CATAPRES) 0.1 MG tablet Take 1 tablet (0.1 mg total) by mouth daily as needed.  . colchicine 0.6 MG tablet Take 1 tablet (0.6 mg total) by mouth daily. (Patient taking differently: Take 0.6 mg by mouth daily as needed (gout).)  . dapagliflozin propanediol (FARXIGA) 5 MG TABS tablet Take 1 tablet (5 mg total) by mouth daily.  . FEROSUL 325 (65 Fe) MG tablet Take 325 mg by mouth 3 (three) times daily.   . furosemide (LASIX) 40 MG tablet Take 40 mg by mouth daily as needed (fluid retention.).  [name] hydrALAZINE (APRESOLINE) 50 MG tablet Take 1 tablet (50 mg total) by mouth 3 (three) times daily.  . isosorbide dinitrate (ISORDIL) 10 MG tablet TAKE 1 TABLET BY MOUTH THREE TIMES DAILY  . mirabegron ER (MYRBETRIQ) 25 MG TB24 tablet Take 1 tablet (25 mg total) by mouth daily.  [name] omeprazole (PRILOSEC) 40 MG capsule Take 1 capsule (40 mg total) by mouth daily. Take 30-60 minutes prior to breakfast.  . polyethylene glycol (MIRALAX / GLYCOLAX) 17  g packet Take 17 g by mouth daily. (Patient taking differently: Take 17 g by mouth daily as needed (constipation.).)  . prednisoLONE acetate (PRED FORTE) 1 % ophthalmic suspension Place 1 drop into both eyes in the morning and at bedtime.  . RESTASIS 0.05 % ophthalmic emulsion Place 1 drop into both eyes in the morning and at bedtime.  . sertraline (ZOLOFT) 100 MG tablet Take 1 tablet (100 mg total) by mouth  daily. (Patient taking differently: Take 100 mg by mouth at bedtime.)  . tamsulosin (FLOMAX) 0.4 MG CAPS capsule Take 1 capsule (0.4 mg total) by mouth daily after supper.   No facility-administered encounter medications on file as of 03/14/2020.    Review of Systems  Constitutional: Negative for chills and fever.  HENT: Positive for congestion and postnasal drip. Negative for ear discharge, ear pain, rhinorrhea, sinus pressure, sneezing, sore throat and voice change.   Eyes: Negative for pain, discharge, redness and visual disturbance.  Respiratory: Positive for cough, chest tightness, shortness of breath and wheezing.   Cardiovascular: Negative for chest pain and leg swelling.  Musculoskeletal: Negative for gait problem.  Skin: Negative for rash.  All other systems reviewed and are negative.   Observations/Objective: Patient sounds comfortable but is coughing  Assessment and Plan: Problem List Items Addressed This Visit      Cardiovascular and Mediastinum   Essential hypertension   Relevant Medications   hydrALAZINE (APRESOLINE) 50 MG tablet    Other Visit Diagnoses    COVID-19 virus infection    -  Primary   Relevant Medications   amoxicillin-clavulanate (AUGMENTIN) 875-125 MG tablet   predniSONE (DELTASONE) 20 MG tablet      We will increase the hydralzine back to 50 mg.   We'll do Augmentin and prednisone to see if we can clear up his lungs, watch his blood sugars really closely, advised the patient to do so.  Sounds like he is starting to get an exacerbation of his asthma versus bronchitis. Follow up plan: Return in about 4 weeks (around 04/11/2020), or if symptoms worsen or fail to improve, for Hypertension recheck.     I discussed the assessment and treatment plan with the patient. The patient was provided an opportunity to ask questions and all were answered. The patient agreed with the plan and demonstrated an understanding of the instructions.   The patient was  advised to call back or seek an in-person evaluation if the symptoms worsen or if the condition fails to improve as anticipated.  The above assessment and management plan was discussed with the patient. The patient verbalized understanding of and has agreed to the management plan. Patient is aware to call the clinic if symptoms persist or worsen. Patient is aware when to return to the clinic for a follow-up visit. Patient educated on when it is appropriate to go to the emergency department.    I provided 14 minutes of non-face-to-face time during this encounter.    Worthy Rancher, MD

## 2020-03-14 NOTE — Telephone Encounter (Signed)
Called to discuss with patient about COVID-19 symptoms and the use of one of the available treatments for those with mild to moderate Covid symptoms and at a high risk of hospitalization.  Pt appears to qualify for outpatient treatment due to co-morbid conditions and/or a member of an at-risk group in accordance with the FDA Emergency Use Authorization.    Symptom onset: 1/27 Vaccinated: no Booster? no Immunocompromised? no Qualifiers: age, COPD  Unable to reach pt - left him a Advertising account executive and sent mychart   [employer]

## 2020-03-17 ENCOUNTER — Ambulatory Visit (HOSPITAL_COMMUNITY)
Admission: RE | Admit: 2020-03-17 | Discharge: 2020-03-17 | Disposition: A | Discharge status: Home / Self Care | Payer: PPO | Source: Ambulatory Visit | Attending: Gastroenterology | Admitting: Gastroenterology

## 2020-03-18 ENCOUNTER — Ambulatory Visit: Payer: PPO | Admitting: *Deleted

## 2020-03-18 ENCOUNTER — Other Ambulatory Visit: Payer: Self-pay

## 2020-03-18 ENCOUNTER — Other Ambulatory Visit: Payer: PPO

## 2020-03-18 DIAGNOSIS — E1122 Type 2 diabetes mellitus with diabetic chronic kidney disease: Secondary | ICD-10-CM | POA: Diagnosis not present

## 2020-03-18 DIAGNOSIS — I5042 Chronic combined systolic (congestive) and diastolic (congestive) heart failure: Secondary | ICD-10-CM | POA: Diagnosis not present

## 2020-03-18 DIAGNOSIS — N189 Chronic kidney disease, unspecified: Secondary | ICD-10-CM | POA: Diagnosis not present

## 2020-03-18 DIAGNOSIS — I10 Essential (primary) hypertension: Secondary | ICD-10-CM

## 2020-03-18 DIAGNOSIS — I129 Hypertensive chronic kidney disease with stage 1 through stage 4 chronic kidney disease, or unspecified chronic kidney disease: Secondary | ICD-10-CM | POA: Diagnosis not present

## 2020-03-18 DIAGNOSIS — J441 Chronic obstructive pulmonary disease with (acute) exacerbation: Secondary | ICD-10-CM

## 2020-03-18 DIAGNOSIS — E611 Iron deficiency: Secondary | ICD-10-CM | POA: Diagnosis not present

## 2020-03-18 NOTE — Patient Instructions (Signed)
Visit Information  PATIENT GOALS: Goals Addressed              This Visit's Progress     Patient Stated   .  "I want to keep my blood pressure under control" (pt-stated)   Not on track     Current Barriers:  [name] Knowledge Deficits related to acute on chronic elevation of blood pressure . Chronic Disease Management support and education needs related to Hypertension in a patient with heart failure  Nurse Case Manager Clinical Goal(s):  [name] Over the next 7 days, patient will talk with RN CCM regarding blood pressure management . Over the next 60 days, patient will take all medications as prescribed . Over the next 7 days, patient will check and record blood pressure twice daily and PRN and reach out to PCP with any readings outside of recommended range  Interventions:  . Chart reviewed including relevant office notes and lab results . Collaborated with PCP regarding clonidine increase from .63m daily to BID . Discussed medications with patient and he is taking them as directed . Discussed prednisone use and how it can affect blood pressure and blood sugar. He took his last dose today.  . Discussed home blood pressure readings o Most recent reading today was 1660/63o No diastolic readings in 1016Wtoday . Talked with patient about symptoms o Overall he feels better . Encouraged patient to taking and recording blood pressure twice a day and PRN . Call PCP with any persistently elevated readings above recommended range . Encouraged patient to [facility] . Telephone f/u scheduled with RNCM for 03/21/20  Patient Self Care Activities:  . Performs ADL's independently . Performs IADL's independently  Please see past updates related to this goal by clicking on the "Past Updates" button in the selected goal      .  COPD Symptom Management (pt-stated)   On track     Current Barriers:  . Chronic Disease Management support and education needs related to COPD & shortness  of breath  Nurse Case Manager Clinical Goal(s):  .[name] Current Barriers:  . Chronic Disease Management support and education needs related to COPD management  Nurse Case Manager Clinical Goal(s):  .Marland KitchenOver the next 15 days, [facility]. Over the next 30 days, patient will talk with RN Care Manager regarding self-management of COPD  Interventions:  . 1:1 collaboration with [name], JFransisca Kaufmann MD regarding development and update of comprehensive plan of care as evidenced by provider attestation and co-signature . Inter-disciplinary care team collaboration (see longitudinal plan of care) . Evaluation of current treatment plan related to COPD and patient's adherence to plan as established by provider. . Chart reviewed including relevant office notes and lab results o Covid positive o Recent telephone visit with PCP office o Treated with antibiotics and prednisone . Discussed current symptoms o Overall feels much better . Discussed home O2 testing o O2 sats 94% today o Not using supplemental O2 . Encouraged to test O2 several time a day . Reviewed and discussed medications o Talked with DDelorise Jacksonwith the [phone] should be getting Beztri in the mail soon . Provided telephone number for [place]and encouraged to reach out as needed . Call PCP with any new or worsening symptoms or if O2 is staying below 90 . Reminded that someone is on call after hours and  on the weekend . Seek emergency medical care as needed  Patient Goals/Self-Care Activities Over the next 30 days, patient will: . Check O2 level several times a day . Call PCP with any new or worsening symptoms . Reach out to [facility] [phone] with any questions or concerns about Beztri order . Seek emergency medical attention if needed  Follow Up Plan:   . Telephone follow up appointment with care management team member scheduled for: 03/21/20 with RN Care Manager . The patient has been provided with contact information for the care management team and has been advised to call with any health related questions or concerns.           Patient verbalizes understanding of instructions provided today and agrees to view in [place].   Follow Up Plan:  . Telephone follow up appointment with care management team member scheduled for: 03/21/20 with RN Care Manager . The patient has been provided with contact information for the care management team and has been advised to call with any health related questions or concerns.   [name], BSN, RN-BC Embedded Chronic Care Manager [facility] [place] [facility] Direct Dial: [phone]

## 2020-03-18 NOTE — Chronic Care Management (AMB) (Signed)
Chronic Care Management   Follow Up Note   03/18/2020 Name: Kenneth Fuller MRN: [id number] DOB: 19-Mar-1945  Referred by: [name], [name], MD Reason for referral : Chronic Care Management (RN follow up)   Kenneth Fuller is a 75 y.o. year old male who is a primary care patient of [name], [name], MD. The CCM team was consulted for assistance with chronic disease management and care coordination needs.    Review of patient status, including review of consultants reports, relevant laboratory and other test results, and collaboration with appropriate care team members and the patient's provider was performed as part of comprehensive patient evaluation and provision of chronic care management services.    SDOH (Social Determinants of Health) assessments performed: Yes See Care Plan activities for detailed interventions related to [facility])     Outpatient Encounter Medications as of 03/18/2020  Medication Sig  . albuterol (ACCUNEB) 1.25 MG/3ML nebulizer solution Take 1 ampule by nebulization every 6 (six) hours as needed for wheezing.  [name] albuterol (PROVENTIL HFA;VENTOLIN HFA) 108 (90 Base) MCG/ACT inhaler Inhale 2 puffs into the lungs every 6 (six) hours as needed for wheezing or shortness of breath.  [name] amoxicillin-clavulanate (AUGMENTIN) 875-125 MG tablet Take 1 tablet by mouth 2 (two) times daily.  [name] atorvastatin (LIPITOR) 40 MG tablet Take 1 tablet by mouth once daily (Patient taking differently: Take 40 mg by mouth every evening.)  . azelastine (OPTIVAR) 0.05 % ophthalmic solution Place 1 drop into both eyes in the morning and at bedtime.  . bisoprolol (ZEBETA) 5 MG tablet Take 1/2 (one-half) tablet by mouth once daily  . Budeson-Glycopyrrol-Formoterol (BREZTRI AEROSPHERE) 160-9-4.8 MCG/ACT AERO Inhale 2 puffs into the lungs in the morning and at bedtime.  . Cholecalciferol (VITAMIN D3) 125 MCG (5000 UT) TABS Take 5,000 Units by mouth daily.  . cloNIDine (CATAPRES) 0.1 MG tablet  Take 1 tablet (0.1 mg total) by mouth daily as needed.  . colchicine 0.6 MG tablet Take 1 tablet (0.6 mg total) by mouth daily. (Patient taking differently: Take 0.6 mg by mouth daily as needed (gout).)  . dapagliflozin propanediol (FARXIGA) 5 MG TABS tablet Take 1 tablet (5 mg total) by mouth daily.  . FEROSUL 325 (65 Fe) MG tablet Take 325 mg by mouth 3 (three) times daily.   . furosemide (LASIX) 40 MG tablet Take 40 mg by mouth daily as needed (fluid retention.).  [name] hydrALAZINE (APRESOLINE) 50 MG tablet Take 1 tablet (50 mg total) by mouth 3 (three) times daily.  . isosorbide dinitrate (ISORDIL) 10 MG tablet TAKE 1 TABLET BY MOUTH THREE TIMES DAILY  . mirabegron ER (MYRBETRIQ) 25 MG TB24 tablet Take 1 tablet (25 mg total) by mouth daily.  [name] omeprazole (PRILOSEC) 40 MG capsule Take 1 capsule (40 mg total) by mouth daily. Take 30-60 minutes prior to breakfast.  . polyethylene glycol (MIRALAX / GLYCOLAX) 17 g packet Take 17 g by mouth daily. (Patient taking differently: Take 17 g by mouth daily as needed (constipation.).)  . prednisoLONE acetate (PRED FORTE) 1 % ophthalmic suspension Place 1 drop into both eyes in the morning and at bedtime.  . predniSONE (DELTASONE) 20 MG tablet 2 po at same time daily for 5 days  . RESTASIS 0.05 % ophthalmic emulsion Place 1 drop into both eyes in the morning and at bedtime.  . sertraline (ZOLOFT) 100 MG tablet Take 1 tablet (100 mg total) by mouth daily. (Patient taking differently: Take 100 mg by mouth at bedtime.)  .  tamsulosin (FLOMAX) 0.4 MG CAPS capsule Take 1 capsule (0.4 mg total) by mouth daily after supper.   No facility-administered encounter medications on file as of 03/18/2020.       Care Plan             This Visit's Progress   .  "I want to keep my blood pressure under control" (pt-stated)   Not on track     Current Barriers:  [name] Knowledge Deficits related to acute on chronic elevation of blood pressure . Chronic Disease Management  support and education needs related to Hypertension in a patient with heart failure  Nurse Case Manager Clinical Goal(s):  [name] Over the next 7 days, patient will talk with RN CCM regarding blood pressure management . Over the next 60 days, patient will take all medications as prescribed . Over the next 7 days, patient will check and record blood pressure twice daily and PRN and reach out to PCP with any readings outside of recommended range  Interventions:  . Chart reviewed including relevant office notes and lab results . Collaborated with PCP regarding clonidine increase from .63m daily to BID . Discussed medications with patient and he is taking them as directed . Discussed prednisone use and how it can affect blood pressure and blood sugar. He took his last dose today.  . Discussed home blood pressure readings o Most recent reading today was 1481/85o No diastolic readings in 1631Stoday . Talked with patient about symptoms o Overall he feels better . Encouraged patient to taking and recording blood pressure twice a day and PRN . Call PCP with any persistently elevated readings above recommended range . [facility] . Telephone f/u scheduled with RNCM for 03/21/20  Patient Self Care Activities:  . Performs ADL's independently . Performs IADL's independently     .  COPD Symptom Management (pt-stated)   On track     Current Barriers:  . Chronic Disease Management support and education needs related to COPD & shortness of breath  Nurse Case Manager Clinical Goal(s):  .[name] Current Barriers:  . Chronic Disease Management support and education needs related to COPD management  Nurse Case Manager Clinical Goal(s):  .Marland KitchenOver the next 15 days, [facility]. Over the next 30 days, patient will talk with RN Care Manager regarding self-management of COPD  Interventions:  . 1:1  collaboration with [name], JFransisca Kaufmann MD regarding development and update of comprehensive plan of care as evidenced by provider attestation and co-signature . Inter-disciplinary care team collaboration (see longitudinal plan of care) . Evaluation of current treatment plan related to COPD and patient's adherence to plan as established by provider. . Chart reviewed including relevant office notes and lab results o Covid positive o Recent telephone visit with PCP office o Treated with antibiotics and prednisone . Discussed current symptoms o Overall feels much better . Discussed home O2 testing o O2 sats 94% today o Not using supplemental O2 . Encouraged to test O2 several time a day . Reviewed and discussed medications o Talked with DDelorise Jacksonwith the [phone] should be getting Beztri in the mail soon . Provided telephone number for [place] encouraged to reach out as needed . Call PCP with any new or worsening symptoms or if O2 is staying below 90 . Reminded that someone is on call after  hours and on the weekend . Seek emergency medical care as needed  Patient Goals/Self-Care Activities Over the next 30 days, patient will: . Check O2 level several times a day . Call PCP with any new or worsening symptoms . Reach out to [facility] [phone] with any questions or concerns about Beztri order . Seek emergency medical attention if needed  Follow Up Plan:  . Telephone follow up appointment with care management team member scheduled for: 03/21/20 with RN Care Manager . The patient has been provided with contact information for the care management team and has been advised to call with any health related questions or concerns.            Follow Up Plan:  . Telephone follow up appointment with care management team member scheduled for: 03/21/20 with RN Care Manager . The patient has  been provided with contact information for the care management team and has been advised to call with any health related questions or concerns.   [name], BSN, RN-BC Embedded Chronic Care Manager [facility] / [place] Management Direct Dial: [phone]

## 2020-03-21 ENCOUNTER — Telehealth: Payer: Self-pay | Admitting: *Deleted

## 2020-03-21 ENCOUNTER — Telehealth: Payer: PPO | Admitting: *Deleted

## 2020-03-24 ENCOUNTER — Telehealth: Payer: PPO | Admitting: *Deleted

## 2020-03-24 ENCOUNTER — Encounter: Payer: Self-pay | Admitting: *Deleted

## 2020-03-24 MED ORDER — CLONIDINE HCL 0.2 MG PO TABS: 0.2000 mg | ORAL_TABLET | Freq: Three times a day (TID) | ORAL | 1 refills | Status: DC

## 2020-03-24 NOTE — Telephone Encounter (Signed)
Sent a prescription of a higher dose of clonidine to take 3 times daily for now and see if that does it for [place].  Caryl Pina, MD [place] 03/24/2020, 12:52 PM

## 2020-03-24 NOTE — Telephone Encounter (Signed)
03/24/2020  Left detailed message on patient's personal voicemail. Advised to check and record blood pressure once or twice a day and as needed and to bring log to PCP appt. Advised to call me with any questions and to call the office to schedule a f/u with Dr [name].   [name], BSN, RN-BC Embedded Chronic Care Manager [facility] / [place] Management Direct Dial: [phone]

## 2020-03-24 NOTE — Telephone Encounter (Signed)
03/21/20  Patient has taken the 20 tablets of clonidine and his blood pressure is still running consistently high. He checked it while on the phone with me and it was 164/94.   Forwarding to PCP for review and recommendation.   [name], BSN, RN-BC Embedded Chronic Care Manager [facility] / [place] Management Direct Dial: [phone]

## 2020-03-26 DIAGNOSIS — R809 Proteinuria, unspecified: Secondary | ICD-10-CM | POA: Diagnosis not present

## 2020-03-26 DIAGNOSIS — E611 Iron deficiency: Secondary | ICD-10-CM | POA: Diagnosis not present

## 2020-03-26 DIAGNOSIS — I5042 Chronic combined systolic (congestive) and diastolic (congestive) heart failure: Secondary | ICD-10-CM | POA: Diagnosis not present

## 2020-03-26 DIAGNOSIS — E1129 Type 2 diabetes mellitus with other diabetic kidney complication: Secondary | ICD-10-CM | POA: Diagnosis not present

## 2020-03-26 DIAGNOSIS — N189 Chronic kidney disease, unspecified: Secondary | ICD-10-CM | POA: Diagnosis not present

## 2020-03-26 DIAGNOSIS — E1122 Type 2 diabetes mellitus with diabetic chronic kidney disease: Secondary | ICD-10-CM | POA: Diagnosis not present

## 2020-03-26 DIAGNOSIS — I129 Hypertensive chronic kidney disease with stage 1 through stage 4 chronic kidney disease, or unspecified chronic kidney disease: Secondary | ICD-10-CM | POA: Diagnosis not present

## 2020-03-31 ENCOUNTER — Ambulatory Visit (HOSPITAL_COMMUNITY)
Admission: RE | Admit: 2020-03-31 | Discharge: 2020-03-31 | Disposition: A | Discharge status: Home / Self Care | Payer: PPO | Source: Ambulatory Visit | Attending: Gastroenterology | Admitting: Gastroenterology

## 2020-03-31 ENCOUNTER — Other Ambulatory Visit: Payer: Self-pay

## 2020-03-31 DIAGNOSIS — K224 Dyskinesia of esophagus: Secondary | ICD-10-CM | POA: Diagnosis not present

## 2020-03-31 DIAGNOSIS — R1310 Dysphagia, unspecified: Secondary | ICD-10-CM | POA: Insufficient documentation

## 2020-04-15 ENCOUNTER — Ambulatory Visit (INDEPENDENT_AMBULATORY_CARE_PROVIDER_SITE_OTHER): Payer: PPO | Admitting: Licensed Clinical Social Worker

## 2020-04-15 DIAGNOSIS — F339 Major depressive disorder, recurrent, unspecified: Secondary | ICD-10-CM | POA: Diagnosis not present

## 2020-04-15 DIAGNOSIS — I5042 Chronic combined systolic (congestive) and diastolic (congestive) heart failure: Secondary | ICD-10-CM

## 2020-04-15 DIAGNOSIS — I2510 Atherosclerotic heart disease of native coronary artery without angina pectoris: Secondary | ICD-10-CM | POA: Diagnosis not present

## 2020-04-15 DIAGNOSIS — E1142 Type 2 diabetes mellitus with diabetic polyneuropathy: Secondary | ICD-10-CM | POA: Diagnosis not present

## 2020-04-15 DIAGNOSIS — J45991 Cough variant asthma: Secondary | ICD-10-CM | POA: Diagnosis not present

## 2020-04-15 DIAGNOSIS — J441 Chronic obstructive pulmonary disease with (acute) exacerbation: Secondary | ICD-10-CM | POA: Diagnosis not present

## 2020-04-15 DIAGNOSIS — E119 Type 2 diabetes mellitus without complications: Secondary | ICD-10-CM | POA: Diagnosis not present

## 2020-04-15 DIAGNOSIS — N1832 Chronic kidney disease, stage 3b: Secondary | ICD-10-CM

## 2020-04-15 NOTE — Chronic Care Management (AMB) (Signed)
Chronic Care Management    Clinical Social Work Follow Up Note  04/15/2020 Name: Kenneth Fuller MRN: [id number] DOB: Aug 11, 1945  Kenneth Fuller is a 75 y.o. year old male who is a primary care patient of [name], [name], MD. The CCM team was consulted for assistance with [name] .   Review of patient status, including review of consultants reports, other relevant assessments, and collaboration with appropriate care team members and the patient's provider was performed as part of comprehensive patient evaluation and provision of chronic care management services.    SDOH (Social Determinants of Health) assessments performed: No; risk for depression; risk for tobacco use; risk for stress; risk for physical inactivity  [place] Office Visit from 05/28/2019 in [place]  PHQ-9 Total Score 10     GAD 7 : Generalized Anxiety Score 05/28/2019 11/11/2017  Nervous, Anxious, on Edge 0 3  Control/stop worrying 2 3  Worry too much - different things 2 3  Trouble relaxing 2 3  Restless 0 3  Easily annoyed or irritable 2 3  Afraid - awful might happen 0 0  Total GAD 7 Score 8 18  Anxiety Difficulty Somewhat difficult Somewhat difficult    Outpatient Encounter Medications as of 04/15/2020  Medication Sig  . albuterol (ACCUNEB) 1.25 MG/3ML nebulizer solution Take 1 ampule by nebulization every 6 (six) hours as needed for wheezing.  [name] albuterol (PROVENTIL HFA;VENTOLIN HFA) 108 (90 Base) MCG/ACT inhaler Inhale 2 puffs into the lungs every 6 (six) hours as needed for wheezing or shortness of breath.  [name] amoxicillin-clavulanate (AUGMENTIN) 875-125 MG tablet Take 1 tablet by mouth 2 (two) times daily.  [name] atorvastatin (LIPITOR) 40 MG tablet Take 1 tablet by mouth once daily (Patient taking differently: Take 40 mg by mouth every evening.)  . azelastine (OPTIVAR) 0.05 % ophthalmic solution Place 1 drop into both eyes in the morning and at bedtime.  .  bisoprolol (ZEBETA) 5 MG tablet Take 1/2 (one-half) tablet by mouth once daily  . Budeson-Glycopyrrol-Formoterol (BREZTRI AEROSPHERE) 160-9-4.8 MCG/ACT AERO Inhale 2 puffs into the lungs in the morning and at bedtime.  . Cholecalciferol (VITAMIN D3) 125 MCG (5000 UT) TABS Take 5,000 Units by mouth daily.  . cloNIDine (CATAPRES) 0.2 MG tablet Take 1 tablet (0.2 mg total) by mouth 3 (three) times daily.  . colchicine 0.6 MG tablet Take 1 tablet (0.6 mg total) by mouth daily. (Patient taking differently: Take 0.6 mg by mouth daily as needed (gout).)  . dapagliflozin propanediol (FARXIGA) 5 MG TABS tablet Take 1 tablet (5 mg total) by mouth daily.  . FEROSUL 325 (65 Fe) MG tablet Take 325 mg by mouth 3 (three) times daily.   . furosemide (LASIX) 40 MG tablet Take 40 mg by mouth daily as needed (fluid retention.).  [name] hydrALAZINE (APRESOLINE) 50 MG tablet Take 1 tablet (50 mg total) by mouth 3 (three) times daily.  . isosorbide dinitrate (ISORDIL) 10 MG tablet TAKE 1 TABLET BY MOUTH THREE TIMES DAILY  . mirabegron ER (MYRBETRIQ) 25 MG TB24 tablet Take 1 tablet (25 mg total) by mouth daily.  [name] omeprazole (PRILOSEC) 40 MG capsule Take 1 capsule (40 mg total) by mouth daily. Take 30-60 minutes prior to breakfast.  . polyethylene glycol (MIRALAX / GLYCOLAX) 17 g packet Take 17 g by mouth daily. (Patient taking differently: Take 17 g by mouth daily as needed (constipation.).)  . prednisoLONE acetate (PRED FORTE) 1 % ophthalmic suspension Place 1 drop into both eyes  in the morning and at bedtime.  . predniSONE (DELTASONE) 20 MG tablet 2 po at same time daily for 5 days  . RESTASIS 0.05 % ophthalmic emulsion Place 1 drop into both eyes in the morning and at bedtime.  . sertraline (ZOLOFT) 100 MG tablet Take 1 tablet (100 mg total) by mouth daily. (Patient taking differently: Take 100 mg by mouth at bedtime.)  . tamsulosin (FLOMAX) 0.4 MG CAPS capsule Take 1 capsule (0.4 mg total) by mouth daily after supper.    No facility-administered encounter medications on file as of 04/15/2020.     Goals    .  Client has stress related to ongoing health issues and wishes to talk more about stress over current health issues (pt-stated)      Current Barriers:  [name] Mental Health Concerns  in patient with Chronic Diagnoses of CAD, DM Type II, Depression,recurrent, CKD, COPD and CHF .  Challenges related to managing health issues of client   Clinical Social Work Clinical Goal(s):  [name] Over the next 30 days, client will work with LCSW to address concerns related to health issues of client and client management of health issues faced  Interventions: Talked with client about grief issues of client Talked with client about sleeping issues of client Talked with client about family support Talked with client about blood pressure readings and concerns of patient  related to blood pressure readings Collaborated with RNCM to discuss nursing needs of client  Talked with client  about challenges with shortness of breath (he has inhaler to use as needed) Talked with client about his use of C-pap and difficulty in using CPap.(client feels that use of CPap dries out his nasal passage. He said he also has dry mouth during the day) Provided counseling support for client  Talked with client about transport needs of client Talked with client about medication procurement Talked with client about his appointment with Cardiologist on April 29, 2020. Talked with client about upcoming client medical appointments Talked with client about pain issues Talked with client about his job responsibilities Talked with client about energy level of client Talked with client about dizziness of client   Patient Self Care Activities:  . Self administers medications as prescribed . Attends all scheduled provider appointments . Performs ADL's independently   Plan:  LCSW to call client in next 4 weeks to talk with client about stress issues  related to his management of health issues faced Client to communicate with RNCM to discuss nursing needs of client Client to attend scheduled client medical appointments   Initial goal documentation         Follow Up Plan:  LCSW to call client in next 4 weeks to talk with him about stress issues related to management of health issues faced  [name].Paden Senger MSW, LCSW Licensed Clinical Social Worker [facility] Care Management [phone]

## 2020-04-15 NOTE — Patient Instructions (Addendum)
Licensed Clinical Social Worker Visit Information  Goals we discussed today:   .  Client has stress related to ongoing health issues and wishes to talk more about stress over current health issues (pt-stated)        Current Barriers:   Mental Health Concerns  in patient with Chronic Diagnoses of CAD, DM Type II, Depression,recurrent, CKD, COPD and CHF   Challenges related to managing health issues of client   Clinical Social Work Clinical Goal(s):   Over the next 30 days, client will work with LCSW to address concerns related to health issues of client and client management of health issues faced  Interventions: Talked with client about grief issues of client Talked with client about sleeping issues of client Talked with client about family support Talked with client about blood pressure readings and concerns of patient related to blood pressure readings Collaborated with RNCM to discuss nursing needs of client  Talked with client about challenges with shortness of breath (he has inhaler to use as needed) Talked with client about his use of C-pap and difficulty in using CPap.(client feels that use of CPap dries out his nasal passage. He said he also has dry mouth during the day) Provided counseling support for client Talked with client about transport needs of client Talked with client about medication procurement Talked with client about his appointment with Cardiologist on April 29, 2020. Talked with client about upcoming client medical appointments Talked with client about pain issues Talked with client about his job responsibilities Talked with client about energy level of client Talked with client about dizziness of client   Patient Self Care Activities:   Self administers medications as prescribed  Attends all scheduled provider appointments  Performs ADL's independently   Plan:  LCSW to call client in next 4 weeks to talk with client about stress issues  related to his management of health issues faced Client to communicate with RNCM to discuss nursing needs of client Client to attend scheduled client medical appointments   Initial goal documentation         Follow Up Plan: LCSW to call client in next 4 weeks to talk with him about stress issues related to management of health issues faced  Materials Provided: No  The patient verbalized understanding of instructions provided today and declined a print copy of patient instruction materials.   [name].Joren Rehm MSW, LCSW Licensed Clinical Social Worker [facility] Care Management [phone]

## 2020-04-18 NOTE — Telephone Encounter (Signed)
Erroneous encounter This encounter was created in error - please disregard. 

## 2020-04-29 ENCOUNTER — Encounter: Payer: Self-pay | Admitting: Cardiovascular Disease

## 2020-04-29 ENCOUNTER — Other Ambulatory Visit: Payer: Self-pay

## 2020-04-29 ENCOUNTER — Ambulatory Visit (INDEPENDENT_AMBULATORY_CARE_PROVIDER_SITE_OTHER): Payer: PPO | Admitting: Cardiovascular Disease

## 2020-04-29 VITALS — BP 132/78 | HR 61 | Ht 67.0 in | Wt 267.0 lb

## 2020-04-29 DIAGNOSIS — E669 Obesity, unspecified: Secondary | ICD-10-CM | POA: Diagnosis not present

## 2020-04-29 DIAGNOSIS — E785 Hyperlipidemia, unspecified: Secondary | ICD-10-CM

## 2020-04-29 DIAGNOSIS — I2510 Atherosclerotic heart disease of native coronary artery without angina pectoris: Secondary | ICD-10-CM | POA: Diagnosis not present

## 2020-04-29 DIAGNOSIS — G4733 Obstructive sleep apnea (adult) (pediatric): Secondary | ICD-10-CM

## 2020-04-29 DIAGNOSIS — E1169 Type 2 diabetes mellitus with other specified complication: Secondary | ICD-10-CM | POA: Diagnosis not present

## 2020-04-29 DIAGNOSIS — Z9989 Dependence on other enabling machines and devices: Secondary | ICD-10-CM | POA: Diagnosis not present

## 2020-04-29 DIAGNOSIS — J449 Chronic obstructive pulmonary disease, unspecified: Secondary | ICD-10-CM

## 2020-04-29 DIAGNOSIS — N182 Chronic kidney disease, stage 2 (mild): Secondary | ICD-10-CM | POA: Diagnosis not present

## 2020-04-29 DIAGNOSIS — I10 Essential (primary) hypertension: Secondary | ICD-10-CM | POA: Diagnosis not present

## 2020-04-29 DIAGNOSIS — I5032 Chronic diastolic (congestive) heart failure: Secondary | ICD-10-CM | POA: Diagnosis not present

## 2020-04-29 NOTE — Patient Instructions (Signed)
Medication Instructions:  No changes *If you need a refill on your cardiac medications before your next appointment, please call your pharmacy*   Lab Work: None ordered If you have labs (blood work) drawn today and your tests are completely normal, you will receive your results only by: [name] MyChart Message (if you have MyChart) OR . A paper copy in the mail If you have any lab test that is abnormal or we need to change your treatment, we will call you to review the results.   Testing/Procedures: None ordered   Follow-Up: At [facility], you and your health needs are our priority.  As part of our continuing mission to provide you with exceptional heart care, we have created designated Provider Care Teams.  These Care Teams include your primary Cardiologist (physician) and Advanced Practice Providers (APPs -  Physician Assistants and Nurse Practitioners) who all work together to provide you with the care you need, when you need it.  We recommend signing up for the patient portal called "MyChart".  Sign up information is provided on this After Visit Summary.  MyChart is used to connect with patients for Virtual Visits (Telemedicine).  Patients are able to view lab/test results, encounter notes, upcoming appointments, etc.  Non-urgent messages can be sent to your provider as well.   To learn more about what you can do with MyChart, go to [email or web address].    Your next appointment:   12 month(s)  The format for your next appointment:   In Person  Provider:   You may see Sanda Klein, MD or one of the following Advanced Practice Providers on your designated Care Team:    Almyra Deforest, PA-C  Fabian Sharp, PA-C or   Roby Lofts, [place]

## 2020-04-29 NOTE — Progress Notes (Signed)
Cardiology Office Note    Date:  04/29/2020   ID:  Kenneth Fuller, DOB October 04, 1945, MRN [id number]  PCP:  [name], [name], MD  Cardiologist:   Sanda Klein, MD   Chief Complaint  Patient presents with   Congestive Heart Failure    History of Present Illness:  Kenneth Fuller is a 75 y.o. male with chronic diastolic heart failure, morbid obesity, obstructive sleep apnea, hyperlipidemia, diabetes mellitus complicated by neuropathy, minor nonobstructive coronary atherosclerosis, moderately severe restrictive lung disease.  He has a history of angioedema with ACE inhibitors.  Continues to be under emotional stress due to his wife's cirrhosis diagnosis and recurrent problems with encephalopathy.  However they are planning a trip to [facility] later this week for his 75th birthday.  Will be celebrating their 60th wedding anniversary later this year.  He has not had any recent problems with chest tightness.  He continues to have exertional dyspnea, roughly after climbing 1 flight of stairs.  He does not have dyspnea at rest and no longer requires any loop diuretics for edema (has not required furosemide in many months).  He is taking [place] for diabetes.  He does have occasional problems with orthostatic dizziness.  Baseline BNP appears to be around 170. He had a fairly recent coronary angiogram in October 2018 that showed a maximum stenosis of 10% in the mid LAD and mid RCA.  His echocardiogram showed evidence of normal left ventricular systolic function (EF 61-44%) and diastolic dysfunction with indeterminate filling pressures (E/e'=12-13).  He has had problems with chest tightness before with congestive heart failure, improving after diuresis.  His "dry weight" appears to be around 270 lb, today he weighs 267 pounds.    He was hospitalized for severe shortness of breath from November 27 through January 09, 2018.   On admission his BNP was slightly elevated at 330 (previous  baseline 72.6 in 2017).  An echo during that hospitalization which was a mediocre quality study but showed an ejection fraction decreased at 45-50%.  During his hospitalization fo CHF in August 2020 the BNP was elevated to 176. He diuresed 10 liters.  He underwent cardiac catheterization in October 2018 he has preserved left ventricular systolic function and minimal coronary artery disease.  LVEDP was borderline at the time of catheterization (10-14 mmHg).  His echo did show grade 2 diastolic dysfunction.  However pulmonary function test had shown fairly severe obstructive lung disease with FEV1 in the 40-45% of predicted range.  He did not have much improvement with bronchodilators either on the PFTs or clinically.  He does have a history of roughly 5 pack years of smoking, but quit 40 years ago.  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  Chest CT did not show evidence of severe interstitial lung disease.    Past Medical History:  Diagnosis Date   Allergy    Asthma    CAD (coronary artery disease)    CHF (congestive heart failure) (HCC)    Diabetes mellitus    Fibromyalgia    GERD (gastroesophageal reflux disease)    GI bleeding    Gout    Hyperlipidemia    Hypertension    Hypogonadism male    Insomnia    MRSA cellulitis    Neuropathy    Obesity    Shortness of breath dyspnea    with exertion    Sleep apnea    cpap- 14    Wheezing  no asthma diagnosis    Past Surgical History:  Procedure Laterality Date   BACK SURGERY     BIOPSY  12/20/2019   Procedure: BIOPSY;  Surgeon: Milus Banister, MD;  Location: [facility];  Service: Endoscopy;;   CARDIAC CATHETERIZATION     COLONOSCOPY     COLONOSCOPY WITH PROPOFOL N/A 12/20/2019   Procedure: COLONOSCOPY WITH PROPOFOL;  Surgeon: Milus Banister, MD;  Location: [facility];  Service: Endoscopy;  Laterality: N/A;   ESOPHAGOGASTRODUODENOSCOPY (EGD) WITH PROPOFOL N/A  12/20/2019   Procedure: ESOPHAGOGASTRODUODENOSCOPY (EGD) WITH PROPOFOL;  Surgeon: Milus Banister, MD;  Location: [facility];  Service: Endoscopy;  Laterality: N/A;   LEFT HEART CATH AND CORONARY ANGIOGRAPHY N/A 12/02/2016   Procedure: LEFT HEART CATH AND CORONARY ANGIOGRAPHY;  Surgeon: Jettie Booze, MD;  Location: [facility];  Service: Cardiovascular;  Laterality: N/A;   LUMBAR LAMINECTOMY/DECOMPRESSION MICRODISCECTOMY N/A 01/02/2014   Procedure: CENTRAL DECOMPRESSION LUMBAR LAMINECTOMY L3-L4, L4-L5;  Surgeon: Tobi Bastos, MD;  Location: [facility];  Service: Orthopedics;  Laterality: N/A;   neck fusion     POLYPECTOMY  12/20/2019   Procedure: POLYPECTOMY;  Surgeon: Milus Banister, MD;  Location: [facility];  Service: Endoscopy;;    Current Medications: Outpatient Medications Prior to Visit  Medication Sig Dispense Refill   albuterol (ACCUNEB) 1.25 MG/3ML nebulizer solution Take 1 ampule by nebulization every 6 (six) hours as needed for wheezing.     albuterol (PROVENTIL HFA;VENTOLIN HFA) 108 (90 Base) MCG/ACT inhaler Inhale 2 puffs into the lungs every 6 (six) hours as needed for wheezing or shortness of breath. 1 Inhaler 2   atorvastatin (LIPITOR) 40 MG tablet Take 40 mg by mouth daily. 1 Tablet Daily     azelastine (OPTIVAR) 0.05 % ophthalmic solution Place 1 drop into both eyes in the morning and at bedtime.     bisoprolol (ZEBETA) 5 MG tablet Take 1/2 (one-half) tablet by mouth once daily 45 tablet 0   Budeson-Glycopyrrol-Formoterol (BREZTRI AEROSPHERE) 160-9-4.8 MCG/ACT AERO Inhale 2 puffs into the lungs in the morning and at bedtime. 42.8 g 4   Cholecalciferol (VITAMIN D3) 125 MCG (5000 UT) TABS Take 5,000 Units by mouth daily.     cloNIDine (CATAPRES) 0.2 MG tablet Take 1 tablet (0.2 mg total) by mouth 3 (three) times daily. 90 tablet 1   colchicine 0.6 MG tablet Take 0.6 mg by mouth daily. As needed     dapagliflozin propanediol (FARXIGA) 5 MG  TABS tablet Take 1 tablet (5 mg total) by mouth daily. 90 tablet 3   FEROSUL 325 (65 Fe) MG tablet Take 325 mg by mouth 3 (three) times daily.      furosemide (LASIX) 40 MG tablet Take 40 mg by mouth daily as needed (fluid retention.).     hydrALAZINE (APRESOLINE) 50 MG tablet Take 1 tablet (50 mg total) by mouth 3 (three) times daily. 90 tablet 3   isosorbide dinitrate (ISORDIL) 10 MG tablet TAKE 1 TABLET BY MOUTH THREE TIMES DAILY 90 tablet 5   mirabegron ER (MYRBETRIQ) 25 MG TB24 tablet Take 1 tablet (25 mg total) by mouth daily. 30 tablet 3   omeprazole (PRILOSEC) 40 MG capsule Take 1 capsule (40 mg total) by mouth daily. Take 30-60 minutes prior to breakfast. 30 capsule 6   polyethylene glycol (MIRALAX / GLYCOLAX) 17 g packet Take 17 g by mouth daily as needed for mild constipation.     prednisoLONE acetate (PRED FORTE) 1 % ophthalmic suspension Place 1  drop into both eyes in the morning and at bedtime.     predniSONE (DELTASONE) 20 MG tablet 2 po at same time daily for 5 days 10 tablet 0   RESTASIS 0.05 % ophthalmic emulsion Place 1 drop into both eyes in the morning and at bedtime.     sertraline (ZOLOFT) 100 MG tablet Take 1 tablet (100 mg total) by mouth daily. 90 tablet 3   tamsulosin (FLOMAX) 0.4 MG CAPS capsule Take 1 capsule (0.4 mg total) by mouth daily after supper. 90 capsule 3   amoxicillin-clavulanate (AUGMENTIN) 875-125 MG tablet Take 1 tablet by mouth 2 (two) times daily. 20 tablet 0   atorvastatin (LIPITOR) 40 MG tablet Take 1 tablet by mouth once daily (Patient taking differently: Take 1 Tablet Daily) 90 tablet 4   colchicine 0.6 MG tablet Take 1 tablet (0.6 mg total) by mouth daily. (Patient taking differently: Take by mouth daily. As needed) 20 tablet 1   polyethylene glycol (MIRALAX / GLYCOLAX) 17 g packet Take 17 g by mouth daily. (Patient taking differently: Take 17 g by mouth daily as needed.) 30 each 0   No facility-administered medications prior to  visit.     Allergies:   Amlodipine besy-benazepril hcl, Oxycodone, Phenergan [promethazine hcl], and Hydrocodone   Social History   Socioeconomic History   Marital status: Married    Spouse name: Butch Penny   Number of children: 3   Years of education: 8   Highest education level: GED or equivalent  Occupational History   Occupation: retired/disability 1999    Employer: DISABLED    Comment: Textiles  Tobacco Use   Smoking status: Former Smoker    Quit date: 02/08/1969    Years since quitting: 51.2   Smokeless tobacco: Former Systems developer    Quit date: Engineer, civil (consulting) Use: Never used  Substance and Sexual Activity   Alcohol use: No    Alcohol/week: 0.0 standard drinks   Drug use: No   Sexual activity: Not Currently  Other Topics Concern   Not on file  Social History Narrative   Drinks caffeine tea occasionally    Social Determinants of Radio broadcast assistant Strain: Low Risk    Difficulty of Paying Living Expenses: Not hard at all  Food Insecurity: No Food Insecurity   Worried About Charity fundraiser in the Last Year: Never true   Arboriculturist in the Last Year: Never true  Transportation Needs: No Transportation Needs   Lack of Transportation (Medical): No   Lack of Transportation (Non-Medical): No  Physical Activity: Inactive   Days of Exercise per Week: 0 days   Minutes of Exercise per Session: 0 min  Stress: No Stress Concern Present   Feeling of Stress : Not at all  Social Connections: Socially Integrated   Frequency of Communication with Friends and Family: More than three times a week   Frequency of Social Gatherings with Friends and Family: More than three times a week   Attends Religious Services: More than 4 times per year   Active Member of [employer] or Organizations: Yes   Attends Music therapist: More than 4 times per year   Marital Status: Married     Family History:  The patient's family history includes  Colon cancer in his mother; Deep vein thrombosis in his brother; Diabetes in his father; Drug abuse in his sister; Heart attack in his father; Heart attack (age of onset: 42) in his  son; Heart disease in his brother, brother, and father; Heart failure in his sister; Kidney disease in his sister.   ROS:   Please see the history of present illness.    ROS all other symptoms are reviewed and are negative  PHYSICAL EXAM:   VS:  BP 132/78    Pulse 61    Ht _0  (1.702 m)    Wt 267 lb (121.1 kg)    SpO2 92%    BMI 41.82 kg/m      General: Alert, oriented x3, no distress, morbid obesity Head: no evidence of trauma, PERRL, EOMI, no exophtalmos or lid lag, no myxedema, no xanthelasma; normal ears, nose and oropharynx Neck: normal jugular venous pulsations and no hepatojugular reflux; brisk carotid pulses without delay and no carotid bruits Chest: clear to auscultation, no signs of consolidation by percussion or palpation, normal fremitus, symmetrical and full respiratory excursions Cardiovascular: normal position and quality of the apical impulse, regular rhythm, normal first and second heart sounds, no murmurs, rubs or gallops Abdomen: no tenderness or distention, no masses by palpation, no abnormal pulsatility or arterial bruits, normal bowel sounds, no hepatosplenomegaly Extremities: no clubbing, cyanosis or edema; 2+ radial, ulnar and brachial pulses bilaterally; 2+ right femoral, posterior tibial and dorsalis pedis pulses; 2+ left femoral, posterior tibial and dorsalis pedis pulses; no subclavian or femoral bruits Neurological: grossly nonfocal Psych: Normal mood and affect    Wt Readings from Last 3 Encounters:  04/29/20 267 lb (121.1 kg)  02/26/20 270 lb (122.5 kg)  02/07/20 264 lb (119.7 kg)      Studies/Labs Reviewed:   EKG:  EKG is not ordered today.  Personally reviewed 01/22/2020 tracing shows normal sinus rhythm, normal ECG. Recent Labs: 10/04/2019: ALT 23 11/01/2019: BUN 19;  Creatinine, Ser 1.09; Potassium 4.2; Sodium 135 02/19/2020: Hemoglobin 16.3; Platelets 189   Lipid Panel    Component Value Date/Time   CHOL 90 (L) 10/04/2019 1349   CHOL 111 06/12/2012 1232   TRIG 142 10/04/2019 1349   TRIG 141 01/24/2014 1143   TRIG 192 (H) 06/12/2012 1232   HDL 40 10/04/2019 1349   HDL 38 (L) 01/24/2014 1143   HDL 33 (L) 06/12/2012 1232   CHOLHDL 2.3 10/04/2019 1349   CHOLHDL 4.8 11/22/2006 1712   VLDL 39 11/22/2006 1712   LDLCALC 26 10/04/2019 1349   LDLCALC 27 10/25/2013 1224   LDLCALC 40 06/12/2012 1232    10/04/2019 hemoglobin A1c 5.8%  ASSESSMENT:    1. Chronic diastolic heart failure ([place])   2. Chronic obstructive pulmonary disease, unspecified COPD type ([place])   3. OSA on CPAP   4. Essential hypertension   5. Coronary artery disease involving native coronary artery of native heart without angina pectoris   6. Hyperlipidemia associated with type 2 diabetes mellitus ([place])   7. Diabetes mellitus type 2 in obese ([place])   8. CKD (chronic kidney disease) stage 2, GFR 60-89 ml/min   9. Morbid obesity ([place])      PLAN:  In order of problems listed above:  1. CHF: NYHA functional class II, clinically euvolemic, no longer requiring furosemide, probably because he started treatment with an SGLT2 inhibitor.  At target "dry weight" under 270 pounds.   2. COPD: Lung disease remains his biggest functional limitation.  He had markedly abnormal PFTs in the past, FEV1 40-45% of normal.  Prefer to use bisoprolol since it is the most selective beta-blocker. 3. OSA: Reports compliance with CPAP and denies daytime hypersomnolence. 4. HTN:  Well-controlled on current medications.  Has some orthostatic dizziness, but it is not severe.  Advised him to stay well-hydrated. 5. CAD: His previous problems with chest tightness appear to be related to heart failure exacerbation.  He had minimal CAD at catheterization.  Repeat coronary angiography in October 2018 showed only minor  nonobstructive atherosclerosis, no progression from 2011 6. HLP: All lipid parameters are in target range on current medicine. 7. DM: Exceptionally well controlled glucose levels, the dapagliflozin may also be helping with his volume status. 8. CKD: Creatinine has been better, most recently 1.09. 9. Morbid obesity would benefit from weight loss.     Medication Adjustments/Labs and Tests Ordered: Current medicines are reviewed at length with the patient today.  Concerns regarding medicines are outlined above.  Medication changes, Labs and Tests ordered today are listed in the Patient Instructions below. Patient Instructions  Medication Instructions:  No changes *If you need a refill on your cardiac medications before your next appointment, please call your pharmacy*   Lab Work: None ordered If you have labs (blood work) drawn today and your tests are completely normal, you will receive your results only by:  [place] (if you have MyChart) OR  A paper copy in the mail If you have any lab test that is abnormal or we need to change your treatment, we will call you to review the results.   Testing/Procedures: None ordered   Follow-Up: At [facility], you and your health needs are our priority.  As part of our continuing mission to provide you with exceptional heart care, we have created designated Provider Care Teams.  These Care Teams include your primary Cardiologist (physician) and Advanced Practice Providers (APPs -  Physician Assistants and Nurse Practitioners) who all work together to provide you with the care you need, when you need it.  We recommend signing up for the patient portal called "MyChart".  Sign up information is provided on this After Visit Summary.  MyChart is used to connect with patients for Virtual Visits (Telemedicine).  Patients are able to view lab/test results, encounter notes, upcoming appointments, etc.  Non-urgent messages can be sent to your  provider as well.   To learn more about what you can do with MyChart, go to [email or web address].    Your next appointment:   12 month(s)  The format for your next appointment:   In Person  Provider:   You may see Sanda Klein, MD or one of the following Advanced Practice Providers on your designated Care Team:    Almyra Deforest, PA-C  Fabian Sharp, [place] or   Roby Lofts, PA-C     Signed, Sanda Klein, MD  04/29/2020 10:08 AM    [address], [place],   [address] Phone: [phone]; Fax: [phone]

## 2020-05-05 ENCOUNTER — Ambulatory Visit: Payer: PPO | Admitting: *Deleted

## 2020-05-05 ENCOUNTER — Telehealth: Payer: Self-pay | Admitting: Internal Medicine

## 2020-05-05 ENCOUNTER — Ambulatory Visit (INDEPENDENT_AMBULATORY_CARE_PROVIDER_SITE_OTHER): Payer: PPO

## 2020-05-05 ENCOUNTER — Ambulatory Visit (INDEPENDENT_AMBULATORY_CARE_PROVIDER_SITE_OTHER): Payer: PPO | Admitting: Family Medicine

## 2020-05-05 ENCOUNTER — Encounter: Payer: Self-pay | Admitting: Family Medicine

## 2020-05-05 VITALS — BP 196/95 | HR 73 | Temp 100.1°F

## 2020-05-05 DIAGNOSIS — F339 Major depressive disorder, recurrent, unspecified: Secondary | ICD-10-CM | POA: Diagnosis not present

## 2020-05-05 DIAGNOSIS — R059 Cough, unspecified: Secondary | ICD-10-CM | POA: Diagnosis not present

## 2020-05-05 DIAGNOSIS — J069 Acute upper respiratory infection, unspecified: Secondary | ICD-10-CM

## 2020-05-05 DIAGNOSIS — I5042 Chronic combined systolic (congestive) and diastolic (congestive) heart failure: Secondary | ICD-10-CM | POA: Diagnosis not present

## 2020-05-05 DIAGNOSIS — E119 Type 2 diabetes mellitus without complications: Secondary | ICD-10-CM | POA: Diagnosis not present

## 2020-05-05 DIAGNOSIS — J441 Chronic obstructive pulmonary disease with (acute) exacerbation: Secondary | ICD-10-CM

## 2020-05-05 DIAGNOSIS — R062 Wheezing: Secondary | ICD-10-CM | POA: Diagnosis not present

## 2020-05-05 DIAGNOSIS — I2510 Atherosclerotic heart disease of native coronary artery without angina pectoris: Secondary | ICD-10-CM

## 2020-05-05 DIAGNOSIS — E1142 Type 2 diabetes mellitus with diabetic polyneuropathy: Secondary | ICD-10-CM

## 2020-05-05 DIAGNOSIS — J45991 Cough variant asthma: Secondary | ICD-10-CM

## 2020-05-05 DIAGNOSIS — J189 Pneumonia, unspecified organism: Secondary | ICD-10-CM | POA: Diagnosis not present

## 2020-05-05 MED ORDER — CEFTRIAXONE SODIUM 1 G IJ SOLR
1.0000 g | Freq: Once | INTRAMUSCULAR | Status: AC
  Administered 2020-05-05: 1 g via INTRAMUSCULAR

## 2020-05-05 MED ORDER — BETAMETHASONE SOD PHOS & ACET 6 (3-3) MG/ML IJ SUSP
6.0000 mg | Freq: Once | INTRAMUSCULAR | Status: AC
  Administered 2020-05-05: 6 mg via INTRAMUSCULAR

## 2020-05-05 MED ORDER — LEVOFLOXACIN 500 MG PO TABS
500.0000 mg | ORAL_TABLET | Freq: Every day | ORAL | 0 refills | Status: DC
Start: 2020-05-05 — End: 2020-05-19

## 2020-05-05 MED ORDER — METHYLPREDNISOLONE 4 MG PO TBPK: ORAL_TABLET | ORAL | 0 refills | Status: DC

## 2020-05-05 MED ORDER — ALBUTEROL SULFATE HFA 108 (90 BASE) MCG/ACT IN AERS: 2.0000 | INHALATION_SPRAY | Freq: Four times a day (QID) | RESPIRATORY_TRACT | 2 refills | Status: DC | PRN

## 2020-05-05 NOTE — Chronic Care Management (AMB) (Signed)
Chronic Care Management   CCM RN Visit Note  05/05/2020 Name: Kenneth Fuller MRN: [id number] DOB: Apr 11, 1945  Subjective: Kenneth Fuller is a 75 y.o. year old male who is a primary care patient of [name], [name], MD. The care management team was consulted for assistance with disease management and care coordination needs.    Engaged with patient by telephone for care coordination in response to provider referral for case management and/or care coordination services.   Consent to Services:  The patient was given information about Chronic Care Management services, agreed to services, and gave verbal consent prior to initiation of services.  Please see initial visit note for detailed documentation.   Patient agreed to services and verbal consent obtained.   Assessment: Review of patient past medical history, allergies, medications, health status, including review of consultants reports, laboratory and other test data, was performed as part of comprehensive evaluation and provision of chronic care management services.   SDOH (Social Determinants of Health) assessments and interventions performed:    CCM Care Plan  Allergies  Allergen Reactions  . Amlodipine Besy-Benazepril Hcl Swelling and Other (See Comments)    Makes tongue swell (lotrel)  . Oxycodone Itching  . Phenergan [Promethazine Hcl] Other (See Comments)    "I can't remember."  . Hydrocodone Itching    Can tolerate in low doses    Outpatient Encounter Medications as of 05/05/2020  Medication Sig  . albuterol (ACCUNEB) 1.25 MG/3ML nebulizer solution Take 1 ampule by nebulization every 6 (six) hours as needed for wheezing.  [name] albuterol (PROVENTIL HFA;VENTOLIN HFA) 108 (90 Base) MCG/ACT inhaler Inhale 2 puffs into the lungs every 6 (six) hours as needed for wheezing or shortness of breath.  [name] atorvastatin (LIPITOR) 40 MG tablet Take 40 mg by mouth daily. 1 Tablet Daily  . azelastine (OPTIVAR) 0.05 % ophthalmic  solution Place 1 drop into both eyes in the morning and at bedtime.  . bisoprolol (ZEBETA) 5 MG tablet Take 1/2 (one-half) tablet by mouth once daily  . Budeson-Glycopyrrol-Formoterol (BREZTRI AEROSPHERE) 160-9-4.8 MCG/ACT AERO Inhale 2 puffs into the lungs in the morning and at bedtime.  . Cholecalciferol (VITAMIN D3) 125 MCG (5000 UT) TABS Take 5,000 Units by mouth daily.  . cloNIDine (CATAPRES) 0.2 MG tablet Take 1 tablet (0.2 mg total) by mouth 3 (three) times daily.  . colchicine 0.6 MG tablet Take 0.6 mg by mouth daily. As needed  . dapagliflozin propanediol (FARXIGA) 5 MG TABS tablet Take 1 tablet (5 mg total) by mouth daily.  . FEROSUL 325 (65 Fe) MG tablet Take 325 mg by mouth 3 (three) times daily.   . furosemide (LASIX) 40 MG tablet Take 40 mg by mouth daily as needed (fluid retention.).  [name] hydrALAZINE (APRESOLINE) 50 MG tablet Take 1 tablet (50 mg total) by mouth 3 (three) times daily.  . isosorbide dinitrate (ISORDIL) 10 MG tablet TAKE 1 TABLET BY MOUTH THREE TIMES DAILY  . mirabegron ER (MYRBETRIQ) 25 MG TB24 tablet Take 1 tablet (25 mg total) by mouth daily.  [name] omeprazole (PRILOSEC) 40 MG capsule Take 1 capsule (40 mg total) by mouth daily. Take 30-60 minutes prior to breakfast.  . polyethylene glycol (MIRALAX / GLYCOLAX) 17 g packet Take 17 g by mouth daily as needed for mild constipation.  . prednisoLONE acetate (PRED FORTE) 1 % ophthalmic suspension Place 1 drop into both eyes in the morning and at bedtime.  . predniSONE (DELTASONE) 20 MG tablet 2 po at same time daily  for 5 days  . RESTASIS 0.05 % ophthalmic emulsion Place 1 drop into both eyes in the morning and at bedtime.  . sertraline (ZOLOFT) 100 MG tablet Take 1 tablet (100 mg total) by mouth daily.  . tamsulosin (FLOMAX) 0.4 MG CAPS capsule Take 1 capsule (0.4 mg total) by mouth daily after supper.   No facility-administered encounter medications on file as of 05/05/2020.    Patient Active Problem List   Diagnosis  Date Noted  . AKI (acute kidney injury) ([place]) 06/05/2019  . Hyperkalemia 06/05/2019  . Acute renal failure (ARF) ([place]) 06/04/2019  . Dizziness 06/04/2019  . Near syncope 06/04/2019  . Insomnia   . CHF (congestive heart failure) ([place]) 01/06/2018  . Depression with anxiety 01/04/2018  . CKD (chronic kidney disease), stage III ([place]) 01/04/2018  . Chronic obstructive pulmonary disease ([place]) 01/04/2018  . Depression, recurrent ([place]) 11/11/2017  . Chronic diastolic heart failure ([place]) 04/13/2017  . Swelling of lower extremity 11/30/2016  . Obesity, Class III, BMI 40-49.9 (morbid obesity) ([place]) 08/21/2015  . Coronary artery disease involving native coronary artery of native heart without angina pectoris 12/05/2014  . Urinary urgency 10/15/2014  . Spinal stenosis, lumbar region, with neurogenic claudication 01/02/2014  . Hyperlipidemia associated with type 2 diabetes mellitus ([place]) 10/25/2013  . COLONIC POLYPS, ADENOMATOUS 02/25/2007  . Diabetes mellitus type II, non insulin dependent ([place]) 02/25/2007  . Gout 02/25/2007  . Essential hypertension 02/25/2007  . Cough variant asthma 02/25/2007  . OSA on CPAP 02/25/2007  . FATIGUE, CHRONIC 02/25/2007  . HEADACHE, CHRONIC 02/25/2007  . GERD (gastroesophageal reflux disease) 02/25/2007    Conditions to be addressed/monitored:CHF, DMII, Pulmonary Disease  Care Plan : RNCM: Cough Variant Asthma (Adult)  Updates made by Ilean China, RN since 05/05/2020 12:00 AM    Problem: Symptom Exacerbation (Asthma)     Goal: Symptom Exacerbation Prevented or Minimized   Start Date: 05/05/2020  This Visit's Progress: Not on track  Priority: High  Note:   Current Barriers:  . Care Coordination needs related to URI in a patient with cough variant asthma . Comorbidities: DM, HTN, CHF  Nurse Case Manager Clinical Goal(s):  . patient will work with PCP to address needs related to medical management of cough variant asthma exacerbation . patient will  meet with RN Care Manager to address self-management of asthma  Interventions:  . 1:1 collaboration with [name], [name], MD regarding development and update of comprehensive plan of care as evidenced by provider attestation and co-signature . Inter-disciplinary care team collaboration (see longitudinal plan of care) . Evaluation of current treatment plan related to cough variant asthma and patient's adherence to plan as established by provider. . Incoming call and voicemail received from patient . Chart reviewed including relevant office notes and lab results . Reviewed and discussed medications . Discussed HPI o Cough and nasal congestion o Using inhalers without improvement . Discussed that patient did contact PCP office to schedule an appointment for this afternoon and he will keep that appointment . Encouraged patient to reach out to PCP with any new or worsening symptoms or if symptoms do not improve over the next week . RN Care Manager will f/u next week to discuss progress of current illness and asthma exacerbation prevention strategies  Patient Goals/Self-Care Activities Over the next 10 days, patient will: . Have an office visit with PCP for evaluation . Take medication as prescribed . Use O2 as directed . Call PCP with any new or worsening symptoms .  Call RN Care Manager as needed [phone]  Follow Up Plan:   Telephone follow up appointment with care management team member scheduled for: [facility] 05/14/20  The patient has been provided with contact information for the care management team and has been advised to call with any health related questions or concerns.   Next PCP appointment scheduled for: today at 3:55 wit Dr Livia Snellen  [name], BSN, RN-BC [place] [facility] [place] Management Direct Dial: [phone]

## 2020-05-05 NOTE — Progress Notes (Signed)
Subjective:  Patient ID: Kenneth Fuller, male    DOB: [birth year]  Age: 75 y.o. MRN: [id number]  CC: Cough, Nasal Congestion, Wheezing, NIGHT FEVER, and Chest Pain   HPI Kenneth Fuller presents for onset 2 days ago of dyspnea.  He has had a profuse cough as well as wheezing.  He had a fever at onset and it continues.  He has a history of COPD.  However he is out of his inhalers.  He has had some anterior chest tightness.  It has not been radiating.  His cough is nonproductive.  His appetite is diminished.  Depression screen [facility] 2/9 05/05/2020 02/07/2020 11/20/2019  Decreased Interest 0 0 0  Down, Depressed, Hopeless 0 1 0  PHQ - 2 Score 0 1 0  Altered sleeping - - -  Tired, decreased energy - - -  Change in appetite - - -  Feeling bad or failure about yourself  - - -  Trouble concentrating - - -  Moving slowly or fidgety/restless - - -  Suicidal thoughts - - -  PHQ-9 Score - - -  Difficult doing work/chores - - -  Some recent data might be hidden    History Kenneth Fuller has a past medical history of Allergy, Asthma, CAD (coronary artery disease), CHF (congestive heart failure) ([place]), Diabetes mellitus, Fibromyalgia, GERD (gastroesophageal reflux disease), GI bleeding, Gout, Hyperlipidemia, Hypertension, Hypogonadism male, Insomnia, MRSA cellulitis, Neuropathy, Obesity, Shortness of breath dyspnea, Sleep apnea, and Wheezing.   He has a past surgical history that includes neck fusion; Back surgery; Colonoscopy; Cardiac catheterization; Lumbar laminectomy/decompression microdiscectomy (N/A, 01/02/2014); LEFT HEART CATH AND CORONARY ANGIOGRAPHY (N/A, 12/02/2016); Esophagogastroduodenoscopy (egd) with propofol (N/A, 12/20/2019); Colonoscopy with propofol (N/A, 12/20/2019); biopsy (12/20/2019); and polypectomy (12/20/2019).   His family history includes Colon cancer in his mother; Deep vein thrombosis in his brother; Diabetes in his father; Drug abuse in his sister; Heart attack in his  father; Heart attack (age of onset: 26) in his son; Heart disease in his brother, brother, and father; Heart failure in his sister; Kidney disease in his sister.He reports that he quit smoking about 51 years ago. He quit smokeless tobacco use about 51 years ago. He reports that he does not drink alcohol and does not use drugs.    ROS Review of Systems  Constitutional: Positive for chills, fatigue and fever.  HENT: Negative for congestion and sore throat.   Respiratory: Positive for cough, shortness of breath and wheezing.   Cardiovascular: Negative for chest pain.  Musculoskeletal: Positive for myalgias.  Skin: Negative for color change and rash.  Neurological: Negative for dizziness and syncope.    Objective:  BP (!) 196/95   Pulse 73   Temp 100.1 F (37.8 C)   SpO2 91%   BP Readings from Last 3 Encounters:  05/05/20 (!) 196/95  04/29/20 132/78  02/26/20 118/68    Wt Readings from Last 3 Encounters:  04/29/20 267 lb (121.1 kg)  02/26/20 270 lb (122.5 kg)  02/07/20 264 lb (119.7 kg)     Physical Exam Vitals reviewed.  Constitutional:      General: He is in acute distress.     Appearance: He is well-developed. He is obese. He is ill-appearing.  HENT:     Head: Normocephalic and atraumatic.     Right Ear: External ear normal.     Left Ear: External ear normal.     Mouth/Throat:     Pharynx: No oropharyngeal exudate or posterior oropharyngeal erythema.  Eyes:     Pupils: Pupils are equal, round, and reactive to light.  Cardiovascular:     Rate and Rhythm: Normal rate and regular rhythm.     Heart sounds: No murmur heard.   Pulmonary:     Effort: No respiratory distress.     Breath sounds: Examination of the right-upper field reveals wheezing. Examination of the left-upper field reveals wheezing. Examination of the right-middle field reveals wheezing. Examination of the left-middle field reveals wheezing. Examination of the right-lower field reveals wheezing.  Examination of the left-lower field reveals wheezing. Wheezing present.  Musculoskeletal:        General: Normal range of motion.     Cervical back: Normal range of motion and neck supple.  Neurological:     Mental Status: He is alert and oriented to person, place, and time.       Assessment & Plan:   Kenneth Fuller was seen today for cough, nasal congestion, wheezing, night fever and chest pain.  Diagnoses and all orders for this visit:  URI with cough and congestion -     Novel Coronavirus, NAA ([facility]) -     DG Chest 2 View; Future  Acute exacerbation of chronic obstructive pulmonary disease (COPD) (HCC) -     CBC with Differential/Platelet -     CMP14+EGFR -     cefTRIAXone (ROCEPHIN) injection 1 g -     betamethasone acetate-betamethasone sodium phosphate (CELESTONE) injection 6 mg  Other orders -     levofloxacin (LEVAQUIN) 500 MG tablet; Take 1 tablet (500 mg total) by mouth daily. For 10 days -     methylPREDNISolone (MEDROL DOSEPAK) 4 MG TBPK tablet; Take according to label directions -     albuterol (VENTOLIN HFA) 108 (90 Base) MCG/ACT inhaler; Inhale 2 puffs into the lungs every 6 (six) hours as needed for wheezing or shortness of breath.       I have changed Kenneth Fuller. Kenneth "Donnie"'s albuterol. I am also having him start on levofloxacin and methylPREDNISolone. Additionally, I am having him maintain his albuterol, sertraline, FeroSul, tamsulosin, omeprazole, mirabegron ER, furosemide, Vitamin D3, Restasis, prednisoLONE acetate, azelastine, dapagliflozin propanediol, isosorbide dinitrate, Breztri Aerosphere, hydrALAZINE, predniSONE, bisoprolol, cloNIDine, colchicine, atorvastatin, and polyethylene glycol. We will continue to administer cefTRIAXone and betamethasone acetate-betamethasone sodium phosphate.  Allergies as of 05/05/2020      Reactions   Amlodipine Besy-benazepril Hcl Swelling, Other (See Comments)   Makes tongue swell (lotrel)   Oxycodone Itching    Phenergan [promethazine Hcl] Other (See Comments)   "I can't remember."   Hydrocodone Itching   Can tolerate in low doses      Medication List       Accurate as of May 05, 2020  4:49 PM. If you have any questions, ask your nurse or doctor.        albuterol 1.25 MG/3ML nebulizer solution Commonly known as: ACCUNEB Take 1 ampule by nebulization every 6 (six) hours as needed for wheezing.   albuterol 108 (90 Base) MCG/ACT inhaler Commonly known as: VENTOLIN HFA Inhale 2 puffs into the lungs every 6 (six) hours as needed for wheezing or shortness of breath.   atorvastatin 40 MG tablet Commonly known as: LIPITOR Take 40 mg by mouth daily. 1 Tablet Daily   azelastine 0.05 % ophthalmic solution Commonly known as: OPTIVAR Place 1 drop into both eyes in the morning and at bedtime.   bisoprolol 5 MG tablet Commonly known as: ZEBETA Take 1/2 (one-half) tablet by mouth once daily  Breztri Aerosphere 160-9-4.8 MCG/ACT Aero Generic drug: Budeson-Glycopyrrol-Formoterol Inhale 2 puffs into the lungs in the morning and at bedtime.   cloNIDine 0.2 MG tablet Commonly known as: CATAPRES Take 1 tablet (0.2 mg total) by mouth 3 (three) times daily.   colchicine 0.6 MG tablet Take 0.6 mg by mouth daily. As needed   dapagliflozin propanediol 5 MG Tabs tablet Commonly known as: Farxiga Take 1 tablet (5 mg total) by mouth daily.   FeroSul 325 (65 FE) MG tablet Generic drug: ferrous sulfate Take 325 mg by mouth 3 (three) times daily.   furosemide 40 MG tablet Commonly known as: LASIX Take 40 mg by mouth daily as needed (fluid retention.).   hydrALAZINE 50 MG tablet Commonly known as: APRESOLINE Take 1 tablet (50 mg total) by mouth 3 (three) times daily.   isosorbide dinitrate 10 MG tablet Commonly known as: ISORDIL TAKE 1 TABLET BY MOUTH THREE TIMES DAILY   levofloxacin 500 MG tablet Commonly known as: LEVAQUIN Take 1 tablet (500 mg total) by mouth daily. For 10  days Started by: Claretta Fraise, MD   methylPREDNISolone 4 MG Tbpk tablet Commonly known as: MEDROL DOSEPAK Take according to label directions Started by: Claretta Fraise, MD   mirabegron ER 25 MG Tb24 tablet Commonly known as: Myrbetriq Take 1 tablet (25 mg total) by mouth daily.   omeprazole 40 MG capsule Commonly known as: PRILOSEC Take 1 capsule (40 mg total) by mouth daily. Take 30-60 minutes prior to breakfast.   polyethylene glycol 17 g packet Commonly known as: MIRALAX / GLYCOLAX Take 17 g by mouth daily as needed for mild constipation.   prednisoLONE acetate 1 % ophthalmic suspension Commonly known as: PRED FORTE Place 1 drop into both eyes in the morning and at bedtime.   predniSONE 20 MG tablet Commonly known as: DELTASONE 2 po at same time daily for 5 days   Restasis 0.05 % ophthalmic emulsion Generic drug: cycloSPORINE Place 1 drop into both eyes in the morning and at bedtime.   sertraline 100 MG tablet Commonly known as: ZOLOFT Take 1 tablet (100 mg total) by mouth daily.   tamsulosin 0.4 MG Caps capsule Commonly known as: FLOMAX Take 1 capsule (0.4 mg total) by mouth daily after supper.   Vitamin D3 125 MCG (5000 UT) Tabs Take 5,000 Units by mouth daily.        Follow-up: No follow-ups on file.  Claretta Fraise, M.D.

## 2020-05-05 NOTE — Patient Instructions (Signed)
Visit Information  PATIENT GOALS: Goals Addressed            This Visit's Progress   . Asthma Exacerbation Improved       Timeframe:  Short-Term Goal Priority:  High Start Date:     05/05/20                        Expected End Date:      05/16/20                 Follow-up: 05/14/20  . Have an office visit with PCP for evaluation . Take medication as prescribed . Use O2 as directed . Call PCP with any new or worsening symptoms . Call RN Care Manager as needed [phone]       Patient verbalizes understanding of instructions provided today and agrees to view in [place].   Follow Up Plan:   Telephone follow up appointment with care management team member scheduled for: [facility] 05/14/20  The patient has been provided with contact information for the care management team and has been advised to call with any health related questions or concerns.   Next PCP appointment scheduled for: today at 3:55 wit Dr Livia Snellen  [name], BSN, RN-BC [place] [facility] [place] Management Direct Dial: [phone]

## 2020-05-05 NOTE — Telephone Encounter (Signed)
Called patient's daughter. Received a busy tone twice. Will call again tomorrow.

## 2020-05-06 LAB — CMP14+EGFR
ALT: 31 IU/L (ref 0–44)
AST: 30 IU/L (ref 0–40)
Albumin/Globulin Ratio: 1.7 (ref 1.2–2.2)
Albumin: 4.5 g/dL (ref 3.7–4.7)
Alkaline Phosphatase: 110 IU/L (ref 44–121)
BUN/Creatinine Ratio: 16 (ref 10–24)
BUN: 19 mg/dL (ref 8–27)
Bilirubin Total: 0.4 mg/dL (ref 0.0–1.2)
CO2: 21 mmol/L (ref 20–29)
Calcium: 9 mg/dL (ref 8.6–10.2)
Chloride: 100 mmol/L (ref 96–106)
Creatinine, Ser: 1.17 mg/dL (ref 0.76–1.27)
Globulin, Total: 2.6 g/dL (ref 1.5–4.5)
Glucose: 137 mg/dL — ABNORMAL HIGH (ref 65–99)
Potassium: 5.1 mmol/L (ref 3.5–5.2)
Sodium: 141 mmol/L (ref 134–144)
Total Protein: 7.1 g/dL (ref 6.0–8.5)
eGFR: 65 mL/min/{1.73_m2} (ref >59)

## 2020-05-06 LAB — CBC WITH DIFFERENTIAL/PLATELET
Basophils Absolute: 0.1 10*3/uL (ref 0.0–0.2)
Basos: 1 %
EOS (ABSOLUTE): 0.2 10*3/uL (ref 0.0–0.4)
Eos: 3 %
Hematocrit: 50.4 % (ref 37.5–51.0)
Hemoglobin: 16.7 g/dL (ref 13.0–17.7)
Immature Grans (Abs): 0 10*3/uL (ref 0.0–0.1)
Immature Granulocytes: 0 %
Lymphocytes Absolute: 1.2 10*3/uL (ref 0.7–3.1)
Lymphs: 22 %
MCH: 28.4 pg (ref 26.6–33.0)
MCHC: 33.1 g/dL (ref 31.5–35.7)
MCV: 86 fL (ref 79–97)
Monocytes Absolute: 0.7 10*3/uL (ref 0.1–0.9)
Monocytes: 12 %
Neutrophils Absolute: 3.5 10*3/uL (ref 1.4–7.0)
Neutrophils: 62 %
Platelets: 181 10*3/uL (ref 150–450)
RBC: 5.88 x10E6/uL — ABNORMAL HIGH (ref 4.14–5.80)
RDW: 15 % (ref 11.6–15.4)
WBC: 5.6 10*3/uL (ref 3.4–10.8)

## 2020-05-06 LAB — SARS-COV-2, NAA 2 DAY TAT

## 2020-05-06 LAB — NOVEL CORONAVIRUS, NAA: SARS-CoV-2, NAA: NOT DETECTED

## 2020-05-06 NOTE — Telephone Encounter (Signed)
Pt's daughter returning a phone call. Pt's daughter can be reached at [phone]

## 2020-05-06 NOTE — Progress Notes (Deleted)
Virtual Visit via Telephone Note  I connected with Tayo Maute Sanon on 05/06/20 at 11:30 AM EDT by telephone and verified that I am speaking with the correct person using two identifiers.  Location: Patient: Home Provider: Office    I discussed the limitations, risks, security and privacy concerns of performing an evaluation and management service by telephone and the availability of in person appointments. I also discussed with the patient that there may be a patient responsible charge related to this service. The patient expressed understanding and agreed to proceed.   History of Present Illness: 75 year old male, former smoker quit 1971.  Past medical history significant for cough variant asthma, irritable laryx syndrome, severe OSA on CPAP, COPD, chronic diastolic heart failure, coronary artery disease, hypertension, GERD, type 2 diabetes, chronic kidney disease stage III, obesity.  Patient of Dr. Chase Caller, last seen in office on 06/08/2017.  Referred by cardiology in 2018 for abnormal PFTs. High resolution CT chest that showed no evidence of interstitial lung disease.  Mild small airway trapping.  Pulmonary function test showed minimal airflow obstruction.  Moderate restriction with reduced diffusing capacity.  Exhaled nitric oxide testing was normal.  Advair was stopped due to upper airway irritation. Previous sleep study showed very severe OSA, AHI >100. Split night study showed optimal control with CPAP 5-20. He has had difficulty tolerating mask in the past but currently compliant.   05/07/2020 Patient contacted today for overdue follow-up. He was getting oxygen equipment from a friend, this recently stopped working. Needs to re-qualify for oxygen. ONO vs amb walk  CPAP with nocturnal oxygen Cough improved with delsym, tramadol and gabapentin 136m TID Metoprolol changed to bisoprolol, on lasix for diastolic HF Willing to undergo pulmonary rehab    Observations/Objective:     Testing:  Echocardiogram October 2018 showed grade 2 diastolic dysfunction.  He then underwent cardiac catheterization October 2018 that was nonobstructive coronary artery disease.    pulmonary function test on December 23, 2016 that shows minimal airflow obstruction, moderate restriction with reduced diffusion capacity.  Total lung capacity 72% FVC, 1.6 L / 40% and DLCO of 19.4/68%.   ONO 01/28/17 -> on RA </ = 88% is 5h 39 minutes. STart 2LNC at night   Assessment and Plan:   Follow Up Instructions:    I discussed the assessment and treatment plan with the patient. The patient was provided an opportunity to ask questions and all were answered. The patient agreed with the plan and demonstrated an understanding of the instructions.   The patient was advised to call back or seek an in-person evaluation if the symptoms worsen or if the condition fails to improve as anticipated.  I provided *** minutes of non-face-to-face time during this encounter.   EMartyn Ehrich NP

## 2020-05-06 NOTE — Telephone Encounter (Signed)
Pt has not been seen at office since 06/08/2017. Pt will need to have an OV prior to any orders able to be sent to DME and pt will need to have another walk test done prior.  Called and spoke with pt's daughter, Lattie Haw who stated after MR arranged for pt to have O2, a family member gave him their O2 to try to help pt save money so the O2 equipment that was provided by DME was sent back. Lattie Haw stated that the O2 equipment that the friend gave pt has quit working and due to this, pt needs to have a new order sent to [place] so pt can be able to get O2 equipment sent back out.  Stated to her since pt has not been seen since 06/2017, pt will need to have an OV and will also have to be walked at that [place] prior to [place] able to send order for O2. Lattie Haw verbalized understanding. appt scheduled for pt tomorrow 3/30 with Beth at 11:30. Nothing further needed.

## 2020-05-06 NOTE — Progress Notes (Signed)
_0  ID: Kenneth Fuller, male    DOB: 1945/11/29, 75 y.o.   MRN: [id number]  Chief Complaint  Patient presents with  . Follow-up    Needing oxygen- 3L continuous.     Referring provider: [name], [name], MD  HPI: 75 year old male, former smoker quit 1971.  Past medical history significant for cough variant asthma, irritable laryx syndrome, severe OSA on CPAP, COPD, chronic diastolic heart failure, coronary artery disease, hypertension, GERD, type 2 diabetes, chronic kidney disease stage III, obesity.  Patient of Dr. Chase Caller, last seen in office on 06/08/2017.  Referred by cardiology in 2018 for abnormal PFTs. High resolution CT chest that showed no evidence of interstitial lung disease.  Mild small airway trapping.  Pulmonary function test showed minimal airflow obstruction.  Moderate restriction with reduced diffusing capacity.  Exhaled nitric oxide testing was normal.  Advair was stopped due to upper airway irritation. Previous sleep study showed very severe OSA, AHI >100. Split night study showed optimal control with CPAP 5-20. He has had difficulty tolerating mask in the past but currently compliant.   05/07/2020  Patient contacted today for overdue follow-up/acute. He was getting oxygen equipment from a friend, this equipment recently stopped working. Needs to re-qualify for oxygen. Accompanied by his daughter. They reports that his shortness of breath worsened this past weekend. Associated wheezing, chest tightness and np congested cough. He always has some degree of dyspnea and wheezing.  He really has to work to get sputum up. He saw his family MD two days ago. CXR 05/06/20 showed subtle opacity RUL which may represent earliest changes of pneumonia. Lungs otherwise clear.  PCR covid negative. He was started on levaquin 554m x 10 days and given medrol dose pack.He is complaint with Breztri. He is supposed to be wearing oxygen at night with CPAP and was wearing it during the day  when needed. He has not been wearing his CPAP d/t dry mouth. He does not know how to adjust humidification on his machine. He last took lasix dose 1 month ago.    Testing:  Echocardiogram October 2018 showed grade 2 diastolic dysfunction.  He then underwent cardiac catheterization October 2018 that was nonobstructive coronary artery disease.    pulmonary function test on December 23, 2016 that shows minimal airflow obstruction, moderate restriction with reduced diffusion capacity.  Total lung capacity 72% FVC, 1.6 L / 40% and DLCO of 19.4/68%.   ONO 01/28/17 -> on RA </ = 88% is 5h 39 minutes. STart 2LNC at night  Allergies  Allergen Reactions  . Amlodipine Besy-Benazepril Hcl Swelling and Other (See Comments)    Makes tongue swell (lotrel)  . Oxycodone Itching  . Phenergan [Promethazine Hcl] Other (See Comments)    "I can't remember."  . Hydrocodone Itching    Can tolerate in low doses    Immunization History  Administered Date(s) Administered  . Fluad Quad(high Dose 65+) 11/15/2018  . Influenza Whole 10/09/2008  . Influenza, High Dose Seasonal PF 11/29/2016, 11/08/2017  . Influenza,inj,Quad PF,6+ Mos 12/04/2013, 12/04/2014, 12/23/2015  . Pneumococcal Conjugate-13 12/04/2013  . Pneumococcal Polysaccharide-23 08/26/2011  . Td 11/08/2017    Past Medical History:  Diagnosis Date  . Allergy   . Asthma   . CAD (coronary artery disease)   . [facility] ([facility]estive heart failure) ([facility]   . Diabetes mellitus   . Fibromyalgia   . GERD (gastroesophageal reflux disease)   . GI bleeding   . Gout   . Hyperlipidemia   . Hypertension   .  Hypogonadism male   . Insomnia   . MRSA cellulitis   . Neuropathy   . Obesity   . Shortness of breath dyspnea    with exertion   . Sleep apnea    cpap- 14   . Wheezing    no asthma diagnosis    Tobacco History: Social History   Tobacco Use  Smoking Status Former Smoker  . Quit date: 02/08/1969  . Years since quitting: 51.2  Smokeless Tobacco  Former Systems developer  . Quit date: 1   Counseling given: Not Answered   Outpatient Medications Prior to Visit  Medication Sig Dispense Refill  . albuterol (ACCUNEB) 1.25 MG/3ML nebulizer solution Take 1 ampule by nebulization every 6 (six) hours as needed for wheezing.    [name] albuterol (VENTOLIN HFA) 108 (90 Base) MCG/ACT inhaler Inhale 2 puffs into the lungs every 6 (six) hours as needed for wheezing or shortness of breath. 1 each 2  . atorvastatin (LIPITOR) 40 MG tablet Take 40 mg by mouth daily. 1 Tablet Daily    . azelastine (OPTIVAR) 0.05 % ophthalmic solution Place 1 drop into both eyes in the morning and at bedtime.    . bisoprolol (ZEBETA) 5 MG tablet Take 1/2 (one-half) tablet by mouth once daily 45 tablet 0  . Budeson-Glycopyrrol-Formoterol (BREZTRI AEROSPHERE) 160-9-4.8 MCG/ACT AERO Inhale 2 puffs into the lungs in the morning and at bedtime. 42.8 g 4  . Cholecalciferol (VITAMIN D3) 125 MCG (5000 UT) TABS Take 5,000 Units by mouth daily.    . cloNIDine (CATAPRES) 0.2 MG tablet Take 1 tablet (0.2 mg total) by mouth 3 (three) times daily. 90 tablet 1  . colchicine 0.6 MG tablet Take 0.6 mg by mouth daily. As needed    . dapagliflozin propanediol (FARXIGA) 5 MG TABS tablet Take 1 tablet (5 mg total) by mouth daily. 90 tablet 3  . FEROSUL 325 (65 Fe) MG tablet Take 325 mg by mouth 3 (three) times daily.     . furosemide (LASIX) 40 MG tablet Take 40 mg by mouth daily as needed (fluid retention.).    [name] hydrALAZINE (APRESOLINE) 50 MG tablet Take 1 tablet (50 mg total) by mouth 3 (three) times daily. 90 tablet 3  . isosorbide dinitrate (ISORDIL) 10 MG tablet TAKE 1 TABLET BY MOUTH THREE TIMES DAILY 90 tablet 5  . levofloxacin (LEVAQUIN) 500 MG tablet Take 1 tablet (500 mg total) by mouth daily. For 10 days 10 tablet 0  . mirabegron ER (MYRBETRIQ) 25 MG TB24 tablet Take 1 tablet (25 mg total) by mouth daily. 30 tablet 3  . omeprazole (PRILOSEC) 40 MG capsule Take 1 capsule (40 mg total) by mouth  daily. Take 30-60 minutes prior to breakfast. 30 capsule 6  . polyethylene glycol (MIRALAX / GLYCOLAX) 17 g packet Take 17 g by mouth daily as needed for mild constipation.    . prednisoLONE acetate (PRED FORTE) 1 % ophthalmic suspension Place 1 drop into both eyes in the morning and at bedtime.    . RESTASIS 0.05 % ophthalmic emulsion Place 1 drop into both eyes in the morning and at bedtime.    . sertraline (ZOLOFT) 100 MG tablet Take 1 tablet (100 mg total) by mouth daily. 90 tablet 3  . tamsulosin (FLOMAX) 0.4 MG CAPS capsule Take 1 capsule (0.4 mg total) by mouth daily after supper. 90 capsule 3  . methylPREDNISolone (MEDROL DOSEPAK) 4 MG TBPK tablet Take according to label directions 1 each 0  . predniSONE (DELTASONE) 20 MG  tablet 2 po at same time daily for 5 days 10 tablet 0   No facility-administered medications prior to visit.    Review of Systems  Review of Systems  Constitutional: Positive for fever.  HENT: Positive for congestion.   Respiratory: Positive for cough, chest tightness, shortness of breath and wheezing.   Cardiovascular: Negative.     Physical Exam  BP 136/86 (BP Location: Right Arm, Cuff Size: Normal)   Pulse 63   Temp (!) 97.4 F (36.3 C)   Ht _0  (1.702 m)   Wt 264 lb (119.7 kg)   SpO2 95%   BMI 41.35 kg/m  Physical Exam Constitutional:      Appearance: Normal appearance. He is obese.  HENT:     Head: Normocephalic and atraumatic.     Mouth/Throat:     Mouth: Mucous membranes are moist.     Pharynx: Oropharynx is clear.  Cardiovascular:     Rate and Rhythm: Normal rate and regular rhythm.  Pulmonary:     Breath sounds: Wheezing present. No rales.     Comments: Audible upper airway wheezing Musculoskeletal:        General: Normal range of motion.  Skin:    General: Skin is warm and dry.  Neurological:     General: No focal deficit present.     Mental Status: He is alert and oriented to person, place, and time. Mental status is at  baseline.  Psychiatric:        Mood and Affect: Mood normal.        Behavior: Behavior normal.        Thought Content: Thought content normal.        Judgment: Judgment normal.      Lab Results:  CBC    Component Value Date/Time   WBC 5.6 05/05/2020 1656   WBC 10.2 11/01/2019 2121   RBC 5.88 (H) 05/05/2020 1656   RBC 5.83 (H) 11/01/2019 2121   HGB 16.7 05/05/2020 1656   HCT 50.4 05/05/2020 1656   PLT 181 05/05/2020 1656   MCV 86 05/05/2020 1656   MCH 28.4 05/05/2020 1656   MCH 26.6 11/01/2019 2121   MCHC 33.1 05/05/2020 1656   MCHC 31.0 11/01/2019 2121   RDW 15.0 05/05/2020 1656   LYMPHSABS 1.2 05/05/2020 1656   MONOABS 0.9 06/03/2019 2137   EOSABS 0.2 05/05/2020 1656   BASOSABS 0.1 05/05/2020 1656    BMET    Component Value Date/Time   NA 141 05/05/2020 1656   K 5.1 05/05/2020 1656   CL 100 05/05/2020 1656   CO2 21 05/05/2020 1656   GLUCOSE 137 (H) 05/05/2020 1656   GLUCOSE 104 (H) 11/01/2019 2121   BUN 19 05/05/2020 1656   CREATININE 1.17 05/05/2020 1656   CREATININE 1.04 06/12/2012 1232   CALCIUM 9.0 05/05/2020 1656   GFRNONAA >60 11/01/2019 2121   GFRNONAA 74 06/12/2012 1232   GFRAA >60 11/01/2019 2121   GFRAA 86 06/12/2012 1232    BNP    Component Value Date/Time   BNP 174.0 (H) 04/03/2019 1901    ProBNP    Component Value Date/Time   PROBNP 652 (H) 01/16/2018 0000   PROBNP 97.0 02/25/2017 1028    Imaging: DG Chest 2 View  Result Date: 05/06/2020 CLINICAL DATA:  Cough and congest pain with wheezing EXAM: CHEST - 2 VIEW COMPARISON:  January 22, 2020 FINDINGS: There is an area of rather subtle increased opacity in the right upper lobe. Lungs elsewhere are clear. Heart  size and pulmonary vascularity are normal. No adenopathy. There is degenerative change in the thoracic spine. Postoperative change noted in the lower cervical region. IMPRESSION: Subtle opacity right upper lobe which may represent earliest changes of pneumonia. Lungs otherwise  clear. Cardiac silhouette within normal limits. These results will be called to the ordering clinician or representative by the Radiologist Assistant, and communication documented in the PACS or [employer]. Electronically Signed   By: Lowella Grip III M.D.   On: 05/06/2020 16:14     Assessment & Plan:   Right upper lobe pneumonia - Patient was seen by his PCP on March 28th. CXR showed subtle RUL opacity, likely early pneumonia. Started on Levaquin 565m x 10 days and medrol dose pack.  He came in today to re-qualify for oxygen. He continues to have significant upper airway wheezing and np cough. He has a history irritable larynx syndrome, this appears exacerbated. Patient was given depo-medrol 1211mIM injection today in office. Changing medrol to Prednisone taper 6076m 3 days; 72m95m3 days; 20mg14m days, 10mg 35mdays. Continue Levaquin as prescribed. Checking sputum culture. Advised patient to monitor glucose levels at night and notify PCP if >350. FU in 1 week.   GERD (gastroesophageal reflux disease) - Recommend treating GERD with max therapy - Continue Protonix 72mg i63mrning; adding famotidine 20mg at51mtime  Irritable larynx - Resume Delsym cough syrup twice a day  - Resume Gabapentin 100mg at 24mime, he can increase to three times a day as needed for cough  Chronic obstructive pulmonary disease (HCC) - Continue Breztri two puff morning and evening; use albuterol nebulizer every 6 hours. He did not qualify for daytime oxygen, ambulatory O2 low was 91-93% RA   OSA on CPAP - Very severe OSA, AHI >100. Split night study showed optimal control with CPAP 20cm, no oxygen requirements at that time  - STRONGLY advised patient resume wearing CPAP at night - Ordering ONO on CPAP to assess for nocturnal oxygen need - Sending in DME order to adjust humidification setting to highest setting d/t dry mouth   Chronic diastolic heart failure (HCC) - [facility] lasix as needed,  checking BNP today    ElizabethMartyn Ehrich/2022

## 2020-05-07 ENCOUNTER — Other Ambulatory Visit: Payer: Self-pay

## 2020-05-07 ENCOUNTER — Ambulatory Visit: Payer: PPO | Admitting: Primary Care

## 2020-05-07 ENCOUNTER — Encounter: Payer: Self-pay | Admitting: Primary Care

## 2020-05-07 VITALS — BP 136/86 | HR 63 | Temp 97.4°F | Ht 67.0 in | Wt 264.0 lb

## 2020-05-07 DIAGNOSIS — I5032 Chronic diastolic (congestive) heart failure: Secondary | ICD-10-CM | POA: Diagnosis not present

## 2020-05-07 DIAGNOSIS — J410 Simple chronic bronchitis: Secondary | ICD-10-CM | POA: Diagnosis not present

## 2020-05-07 DIAGNOSIS — K219 Gastro-esophageal reflux disease without esophagitis: Secondary | ICD-10-CM | POA: Diagnosis not present

## 2020-05-07 DIAGNOSIS — J441 Chronic obstructive pulmonary disease with (acute) exacerbation: Secondary | ICD-10-CM | POA: Diagnosis not present

## 2020-05-07 DIAGNOSIS — G4733 Obstructive sleep apnea (adult) (pediatric): Secondary | ICD-10-CM

## 2020-05-07 DIAGNOSIS — Z9989 Dependence on other enabling machines and devices: Secondary | ICD-10-CM

## 2020-05-07 DIAGNOSIS — J387 Other diseases of larynx: Secondary | ICD-10-CM | POA: Diagnosis not present

## 2020-05-07 DIAGNOSIS — J189 Pneumonia, unspecified organism: Secondary | ICD-10-CM | POA: Diagnosis not present

## 2020-05-07 LAB — BRAIN NATRIURETIC PEPTIDE: Pro B Natriuretic peptide (BNP): 219 pg/mL — ABNORMAL HIGH (ref 0.0–100.0)

## 2020-05-07 MED ORDER — PREDNISONE 20 MG PO TABS: ORAL_TABLET | ORAL | 0 refills | Status: DC

## 2020-05-07 MED ORDER — GABAPENTIN 100 MG PO CAPS: 100.0000 mg | ORAL_CAPSULE | Freq: Three times a day (TID) | ORAL | 1 refills | Status: DC

## 2020-05-07 MED ORDER — FAMOTIDINE 20 MG PO TABS: 20.0000 mg | ORAL_TABLET | Freq: Every day | ORAL | 3 refills | Status: DC

## 2020-05-07 MED ORDER — METHYLPREDNISOLONE ACETATE 80 MG/ML IJ SUSP
120.0000 mg | Freq: Once | INTRAMUSCULAR | Status: AC
  Administered 2020-05-07: 120 mg via INTRAMUSCULAR

## 2020-05-07 NOTE — Assessment & Plan Note (Addendum)
-  Patient was seen by his PCP on March 28th. CXR showed subtle RUL opacity, likely early pneumonia. Started on Levaquin 553m x 10 days and medrol dose pack.  He came in today to re-qualify for oxygen. He continues to have significant upper airway wheezing and np cough. He has a history irritable larynx syndrome, this appears exacerbated. Patient was given depo-medrol 1233mIM injection today in office. Changing medrol to Prednisone taper 6060m 3 days; 45m19m3 days; 20mg32m days, 10mg 4mdays. Continue Levaquin as prescribed. Checking sputum culture. Advised patient to monitor glucose levels at night and notify PCP if >350. FU in 1 week.

## 2020-05-07 NOTE — Patient Instructions (Addendum)
Recommendations: - Resume delsym cough syrup twice a day (over the counter) - Resume Gabapentin 121m - start at bedtime only (Can increase to three times a day as needed for cough) - Continue Breztri two puff morning and evening; use albuterol nebulizer every 6 hours - Continue Levaquin 5079mx 10 days as prescribed  - Stop medrol dose pack; start prednisone taper as prescribed - Continue Protonix 4030mn the morning prior to breakfast - Start famotidine 64m42m bedtime  - You MUST start wearing your BIPAP at night  - Monitor blood sugars closely at home, if > 350 needs to call PCP   Office treatment: - Depo-medrol injection   Orders: - Labs (BNP) - ONO re: nocturnal hypoxia (priority) - Please help patient set humidity to highest setting on CPAP  - Respiratory sputum culture  Follow-up: - 1 week televisit or office visit with BethEustaquio Maize

## 2020-05-07 NOTE — Assessment & Plan Note (Addendum)
-  Continue Breztri two puff morning and evening; use albuterol nebulizer every 6 hours. He did not qualify for daytime oxygen, ambulatory O2 low was 91-93% RA

## 2020-05-07 NOTE — Assessment & Plan Note (Addendum)
-  Very severe OSA, AHI >100. Split night study showed optimal control with CPAP 20cm, no oxygen requirements at that time  - STRONGLY advised patient resume wearing CPAP at night - Ordering ONO on CPAP to assess for nocturnal oxygen need - Sending in DME order to adjust humidification setting to highest setting d/t dry mouth

## 2020-05-07 NOTE — Progress Notes (Signed)
Hello [place],  Your lab result is normal and/or stable.Some minor variations that are not significant are commonly marked abnormal, but do not represent any medical problem for you.  Best regards, Claretta Fraise, M.D.

## 2020-05-07 NOTE — Assessment & Plan Note (Signed)
-  Recommend treating GERD with max therapy - Continue Protonix 68m in morning; adding famotidine 266mat bedtime

## 2020-05-07 NOTE — Assessment & Plan Note (Addendum)
-  Resume Delsym cough syrup twice a day  - Resume Gabapentin 169m at bedtime, he can increase to three times a day as needed for cough

## 2020-05-07 NOTE — Assessment & Plan Note (Signed)
-  Patient takes lasix as needed, checking BNP today

## 2020-05-07 NOTE — Progress Notes (Signed)
Please let patient know his BNP was moderately elevated, I would recommend he take his lasix that he has prn 3-5 times over the course of the next week.

## 2020-05-08 ENCOUNTER — Telehealth: Payer: Self-pay | Admitting: Gastroenterology

## 2020-05-08 NOTE — Telephone Encounter (Signed)
Pt's daughter Lattie Haw called, she states that pt has been very sick recently, she took him to different doctors who told her that his sxs are GI related. She would like to speak with you about that. Pls call her.

## 2020-05-08 NOTE — Telephone Encounter (Signed)
The pt saw PCP and Pulmonary regarding cough and wheezing.  He was told that it is not a pulmonary issue but may be a GI cause.  He is due for a follow up with Dr Ardis Hughs.  Appt made for 05/13/20 to discuss.

## 2020-05-09 ENCOUNTER — Telehealth: Payer: Self-pay | Admitting: Primary Care

## 2020-05-09 DIAGNOSIS — G4733 Obstructive sleep apnea (adult) (pediatric): Secondary | ICD-10-CM

## 2020-05-09 NOTE — Telephone Encounter (Signed)
Called and spoke to pt. Pt requesting new CPAP hose and mask with [employer]. New order placed for supplies. Pt verbalized understanding and denied any further questions or concerns at this time.

## 2020-05-12 ENCOUNTER — Telehealth: Payer: Self-pay | Admitting: Primary Care

## 2020-05-12 ENCOUNTER — Other Ambulatory Visit: Payer: Self-pay

## 2020-05-12 ENCOUNTER — Other Ambulatory Visit: Payer: PPO

## 2020-05-12 DIAGNOSIS — R0902 Hypoxemia: Secondary | ICD-10-CM | POA: Diagnosis not present

## 2020-05-12 DIAGNOSIS — I5032 Chronic diastolic (congestive) heart failure: Secondary | ICD-10-CM | POA: Diagnosis not present

## 2020-05-12 DIAGNOSIS — R0600 Dyspnea, unspecified: Secondary | ICD-10-CM | POA: Diagnosis not present

## 2020-05-12 DIAGNOSIS — G4733 Obstructive sleep apnea (adult) (pediatric): Secondary | ICD-10-CM | POA: Diagnosis not present

## 2020-05-12 DIAGNOSIS — E119 Type 2 diabetes mellitus without complications: Secondary | ICD-10-CM

## 2020-05-12 DIAGNOSIS — J441 Chronic obstructive pulmonary disease with (acute) exacerbation: Secondary | ICD-10-CM

## 2020-05-12 NOTE — Addendum Note (Signed)
Addended by: Amado Coe on: 05/12/2020 03:34 PM   Modules accepted: Orders

## 2020-05-12 NOTE — Telephone Encounter (Signed)
Called spoke with Alvie Heidelberg at [facility] medicine patient had dropped off sputum but our order are to [facility] and [facility].   Called patient let him know we needed a new sample. He is headed into [facility], he can pick up a cup and produce sample and take to that lab while he is there.   New order placed  Nothing further needed at this time.

## 2020-05-12 NOTE — Progress Notes (Signed)
Respiratory order# [id number] ordered as [facility] and dropped off at [facility]. Lab called [place] office @ [phone] to request a [facility] order. The office staff informed the lab that [facility] office will have to call [facility] back at a later time. Raeanne Gathers, MLT

## 2020-05-13 ENCOUNTER — Other Ambulatory Visit: Payer: Self-pay

## 2020-05-13 ENCOUNTER — Encounter: Payer: Self-pay | Admitting: Gastroenterology

## 2020-05-13 ENCOUNTER — Other Ambulatory Visit: Payer: PPO

## 2020-05-13 ENCOUNTER — Ambulatory Visit (INDEPENDENT_AMBULATORY_CARE_PROVIDER_SITE_OTHER): Payer: PPO | Admitting: Gastroenterology

## 2020-05-13 VITALS — BP 116/56 | HR 72 | Ht 67.0 in | Wt 264.0 lb

## 2020-05-13 DIAGNOSIS — K219 Gastro-esophageal reflux disease without esophagitis: Secondary | ICD-10-CM | POA: Diagnosis not present

## 2020-05-13 DIAGNOSIS — J441 Chronic obstructive pulmonary disease with (acute) exacerbation: Secondary | ICD-10-CM | POA: Diagnosis not present

## 2020-05-13 DIAGNOSIS — J410 Simple chronic bronchitis: Secondary | ICD-10-CM | POA: Diagnosis not present

## 2020-05-13 DIAGNOSIS — J189 Pneumonia, unspecified organism: Secondary | ICD-10-CM | POA: Diagnosis not present

## 2020-05-13 DIAGNOSIS — R059 Cough, unspecified: Secondary | ICD-10-CM

## 2020-05-13 MED ORDER — OMEPRAZOLE 40 MG PO CPDR: 40.0000 mg | DELAYED_RELEASE_CAPSULE | Freq: Two times a day (BID) | ORAL | 5 refills | Status: DC

## 2020-05-13 NOTE — Patient Instructions (Signed)
We have sent the following medications to your pharmacy for you to pick up at your convenience: Increase your omeprazole to 40 mg twice daily before breakfast and dinner.   Continue taking Pepcid at bedtime.   Start taking peppermint oil/Altoids 2 before every meal.   Follow up with Dr. Ardis Hughs in 2 months.

## 2020-05-13 NOTE — Progress Notes (Signed)
She states review of pertinent gastrointestinal problems: 1.  Family history of colon cancer, his mother had colon cancer when she was 57.  Adenomatous polyp 2008, small; repeat colonoscopy 01/2011 for minor rectal bleeding found small HP, hemorrhoids;.Colonoscopy June 2017 for dark stools Dr. Ardis Hughs found 3 subcentimeter polyps. The examination was otherwise normal. Pathology showed these were adenomatous polyps.  Colonoscopy 12/2019 2 subcentimeter polyps were removed.  Neither were adenomas.  Recall colonoscopy should be 12/2024. 2. EGD normal2008 for chest discomfort.EGD June 2017 for dark stools, melena and itwas completely normal.  EGD 12/2019 for mild iron deficiency anemia, dysphagia.  Very friable eschar in proximal stomach, similar in appearance to a Cameron's erosion however no hiatal hernia present.  Biopsy showed no sign of dysplasia or H. pylori infection.  He was started on a proton pump inhibitor once daily 3. Intermittent abd pains 01/2012 CT, [place], CBC, cmet all essentially normal. EGD Dr. Ardis Hughs 02/2012 showed mild gastritis, H. plori neg   HPI: This is a very pleasant 75 year old man who is here with I believe his daughter today.   Speech therapy modified barium swallow study January 2022 showed normal oropharyngeal swallowing.  He did cough after swallowing however this was not due to aspiration.  Follow-up barium esophagram February 2022 showed "moderate esophageal dysmotility, likely presbyesophagus.  This results in contrast stasis throughout the esophagus.  Delayed passage of barium tablet at the GE junction, likely secondary to dysmotility" I recommended a trial of peppermint oil with 2 Altoid tabs with every meal.  He weighed 270 pounds at last office visit January 2022  I reviewed his pulmonary note from just a few days ago.  They wrote that he had right upper lobe pneumonia.  GERD, they added Pepcid at bedtime every night.  They believe he has a "irritable larynx".   COPD."  Very severe obstructive sleep apnea"  He has a chronic cough.  Seems like it has been worse recently.  He actually had some shortness of breath and some fever while traveling recently a lot of wheezing and coughing, very productive sputum.  Looks like he has a right upper lobe pneumonia based on pulmonary office notes.  He is under the understanding that a lot of this might be related to gastroesophageal reflux.   He never tried the deltoids that I recommended above.  He is currently taking omeprazole 40 mg in the morning and Pepcid 20 mg at night.   ROS: complete GI ROS as described in HPI, all other review negative.  Constitutional:  No unintentional weight loss   Past Medical History:  Diagnosis Date  . Allergy   . Asthma   . CAD (coronary artery disease)   . CHF (congestive heart failure) ([place])   . Diabetes mellitus   . Fibromyalgia   . GERD (gastroesophageal reflux disease)   . GI bleeding   . Gout   . Hyperlipidemia   . Hypertension   . Hypogonadism male   . Insomnia   . MRSA cellulitis   . Neuropathy   . Obesity   . Shortness of breath dyspnea    with exertion   . Sleep apnea    cpap- 14   . Wheezing    no asthma diagnosis    Past Surgical History:  Procedure Laterality Date  . BACK SURGERY    . BIOPSY  12/20/2019   Procedure: BIOPSY;  Surgeon: Milus Banister, MD;  Location: [facility];  Service: Endoscopy;;  . CARDIAC CATHETERIZATION    .  COLONOSCOPY    . COLONOSCOPY WITH PROPOFOL N/A 12/20/2019   Procedure: COLONOSCOPY WITH PROPOFOL;  Surgeon: Milus Banister, MD;  Location: [facility];  Service: Endoscopy;  Laterality: N/A;  . ESOPHAGOGASTRODUODENOSCOPY (EGD) WITH PROPOFOL N/A 12/20/2019   Procedure: ESOPHAGOGASTRODUODENOSCOPY (EGD) WITH PROPOFOL;  Surgeon: Milus Banister, MD;  Location: [facility];  Service: Endoscopy;  Laterality: N/A;  . LEFT HEART CATH AND CORONARY ANGIOGRAPHY N/A 12/02/2016   Procedure: LEFT HEART CATH AND  CORONARY ANGIOGRAPHY;  Surgeon: Jettie Booze, MD;  Location: [facility];  Service: Cardiovascular;  Laterality: N/A;  . LUMBAR LAMINECTOMY/DECOMPRESSION MICRODISCECTOMY N/A 01/02/2014   Procedure: CENTRAL DECOMPRESSION LUMBAR LAMINECTOMY L3-L4, L4-L5;  Surgeon: Tobi Bastos, MD;  Location: [facility];  Service: Orthopedics;  Laterality: N/A;  . neck fusion    . POLYPECTOMY  12/20/2019   Procedure: POLYPECTOMY;  Surgeon: Milus Banister, MD;  Location: [facility];  Service: Endoscopy;;    Current Outpatient Medications  Medication Sig Dispense Refill  . albuterol (ACCUNEB) 1.25 MG/3ML nebulizer solution Take 1 ampule by nebulization every 6 (six) hours as needed for wheezing.    [name] albuterol (VENTOLIN HFA) 108 (90 Base) MCG/ACT inhaler Inhale 2 puffs into the lungs every 6 (six) hours as needed for wheezing or shortness of breath. 1 each 2  . atorvastatin (LIPITOR) 40 MG tablet Take 40 mg by mouth daily. 1 Tablet Daily    . azelastine (OPTIVAR) 0.05 % ophthalmic solution Place 1 drop into both eyes in the morning and at bedtime.    . bisoprolol (ZEBETA) 5 MG tablet Take 1/2 (one-half) tablet by mouth once daily 45 tablet 0  . Budeson-Glycopyrrol-Formoterol (BREZTRI AEROSPHERE) 160-9-4.8 MCG/ACT AERO Inhale 2 puffs into the lungs in the morning and at bedtime. 42.8 g 4  . Cholecalciferol (VITAMIN D3) 125 MCG (5000 UT) TABS Take 5,000 Units by mouth daily.    . cloNIDine (CATAPRES) 0.2 MG tablet Take 1 tablet (0.2 mg total) by mouth 3 (three) times daily. 90 tablet 1  . colchicine 0.6 MG tablet Take 0.6 mg by mouth daily. As needed    . dapagliflozin propanediol (FARXIGA) 5 MG TABS tablet Take 1 tablet (5 mg total) by mouth daily. 90 tablet 3  . famotidine (PEPCID) 20 MG tablet Take 1 tablet (20 mg total) by mouth at bedtime. 30 tablet 3  . FEROSUL 325 (65 Fe) MG tablet Take 325 mg by mouth 3 (three) times daily.     . furosemide (LASIX) 40 MG tablet Take 40 mg by mouth daily as  needed (fluid retention.).    [name] gabapentin (NEURONTIN) 100 MG capsule Take 1 capsule (100 mg total) by mouth 3 (three) times daily. 90 capsule 1  . hydrALAZINE (APRESOLINE) 50 MG tablet Take 1 tablet (50 mg total) by mouth 3 (three) times daily. 90 tablet 3  . isosorbide dinitrate (ISORDIL) 10 MG tablet TAKE 1 TABLET BY MOUTH THREE TIMES DAILY 90 tablet 5  . levofloxacin (LEVAQUIN) 500 MG tablet Take 1 tablet (500 mg total) by mouth daily. For 10 days 10 tablet 0  . mirabegron ER (MYRBETRIQ) 25 MG TB24 tablet Take 1 tablet (25 mg total) by mouth daily. 30 tablet 3  . omeprazole (PRILOSEC) 40 MG capsule Take 1 capsule (40 mg total) by mouth daily. Take 30-60 minutes prior to breakfast. 30 capsule 6  . polyethylene glycol (MIRALAX / GLYCOLAX) 17 g packet Take 17 g by mouth daily as needed for mild constipation.    [name]  prednisoLONE acetate (PRED FORTE) 1 % ophthalmic suspension Place 1 drop into both eyes in the morning and at bedtime.    . predniSONE (DELTASONE) 20 MG tablet Take 3 tablets (60 mg total) by mouth daily with breakfast for 3 days, THEN 2 tablets (40 mg total) daily with breakfast for 3 days, THEN 1 tablet (20 mg total) daily with breakfast for 3 days, THEN 0.5 tablets (10 mg total) daily with breakfast for 3 days. 20 tablet 0  . RESTASIS 0.05 % ophthalmic emulsion Place 1 drop into both eyes in the morning and at bedtime.    . sertraline (ZOLOFT) 100 MG tablet Take 1 tablet (100 mg total) by mouth daily. 90 tablet 3  . tamsulosin (FLOMAX) 0.4 MG CAPS capsule Take 1 capsule (0.4 mg total) by mouth daily after supper. 90 capsule 3   No current facility-administered medications for this visit.    Allergies as of 05/13/2020 - Review Complete 05/13/2020  Allergen Reaction Noted  . Amlodipine besy-benazepril hcl Swelling and Other (See Comments) 06/24/2010  . Oxycodone Itching 07/11/2017  . Phenergan [promethazine hcl] Other (See Comments) 06/12/2012  . Hydrocodone Itching 02/13/2013     Family History  Problem Relation Age of Onset  . Colon cancer Mother   . Diabetes Father        siblings  . Heart disease Father        brother  . Heart attack Father   . Kidney disease Sister   . Heart failure Sister   . Heart disease Brother   . Heart attack Son 83  . Drug abuse Sister   . Heart disease Brother   . Deep vein thrombosis Brother   . Colon polyps Neg Hx     Social History   Socioeconomic History  . Marital status: Married    Spouse name: Butch Penny  . Number of children: 3  . Years of education: 8  . Highest education level: GED or equivalent  Occupational History  . 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 Smoking status: Former Smoker    Quit date: 02/08/1969    Years since quitting: 51.2  . Smokeless tobacco: Former Systems developer    Quit date: Pharmacologist  . Vaping Use: Never used  Substance and Sexual Activity  . Alcohol use: No    Alcohol/week: 0.0 standard drinks  . Drug use: No  . Sexual activity: Not Currently  Other Topics Concern  . Not on file  Social History Narrative   Drinks caffeine tea occasionally    Social Determinants of Health   Financial Resource Strain: Low Risk   . Difficulty of Paying Living Expenses: Not hard at all  Food Insecurity: No Food Insecurity  . Worried About Charity fundraiser in the Last Year: Never true  . Ran Out of Food in the Last Year: Never true  Transportation Needs: No Transportation Needs  . Lack of Transportation (Medical): No  . Lack of Transportation (Non-Medical): No  Physical Activity: Inactive  . Days of Exercise per Week: 0 days  . Minutes of Exercise per Session: 0 min  Stress: No Stress Concern Present  . Feeling of Stress : Not at all  Social Connections: Socially Integrated  . Frequency of Communication with Friends and Family: More than three times a week  . Frequency of Social Gatherings with Friends and Family: More than three  times a week  . Attends  Religious Services: More than 4 times per year  . Active Member of Clubs or Organizations: Yes  . Attends Archivist Meetings: More than 4 times per year  . Marital Status: Married  Human resources officer Violence: Not At Risk  . Fear of Current or Ex-Partner: No  . Emotionally Abused: No  . Physically Abused: No  . Sexually Abused: No     Physical Exam: Ht 5' 7" (1.702 m)   Wt 264 lb (119.7 kg)   BMI 41.35 kg/m  Constitutional: generally well-appearing Psychiatric: alert and oriented x3 Abdomen: soft, nontender, nondistended, no obvious ascites, no peritoneal signs, normal bowel sounds No peripheral edema noted in lower extremities  Assessment and plan: 75 y.o. male with chronic cough, chronic GERD, nonspecific motility disorder of esophagus  Not clear how much GERD contributes to his chronic pulmonary symptoms but it may be somewhat contributory.  Going to maximize his antiacid therapy by increasing his proton pump inhibitor to twice daily.  He will continue taking Pepcid at bedtime every night.  Also with his nonspecific motility disorder I am recommending a trial of 2 Altoid tabs with every meal.  He will return to see me in 2 months and sooner if needed.  Please see the "Patient Instructions" section for addition details about the plan.  Owens Loffler, MD [facility] 05/13/2020, 10:45 AM   Total time on date of encounter was 30 minutes (this included time spent preparing to see the patient reviewing records; obtaining and/or reviewing separately obtained history; performing a medically appropriate exam and/or evaluation; counseling and educating the patient and family if present; ordering medications, tests or procedures if applicable; and documenting clinical information in the health record).

## 2020-05-14 ENCOUNTER — Other Ambulatory Visit: Payer: Self-pay | Admitting: Family Medicine

## 2020-05-14 ENCOUNTER — Ambulatory Visit (INDEPENDENT_AMBULATORY_CARE_PROVIDER_SITE_OTHER): Payer: PPO | Admitting: *Deleted

## 2020-05-14 DIAGNOSIS — I10 Essential (primary) hypertension: Secondary | ICD-10-CM

## 2020-05-14 DIAGNOSIS — J441 Chronic obstructive pulmonary disease with (acute) exacerbation: Secondary | ICD-10-CM

## 2020-05-15 ENCOUNTER — Ambulatory Visit: Payer: PPO | Admitting: Primary Care

## 2020-05-15 ENCOUNTER — Ambulatory Visit: Payer: PPO | Admitting: Licensed Clinical Social Worker

## 2020-05-15 DIAGNOSIS — E119 Type 2 diabetes mellitus without complications: Secondary | ICD-10-CM

## 2020-05-15 DIAGNOSIS — F339 Major depressive disorder, recurrent, unspecified: Secondary | ICD-10-CM | POA: Diagnosis not present

## 2020-05-15 DIAGNOSIS — I5042 Chronic combined systolic (congestive) and diastolic (congestive) heart failure: Secondary | ICD-10-CM

## 2020-05-15 DIAGNOSIS — I10 Essential (primary) hypertension: Secondary | ICD-10-CM

## 2020-05-15 DIAGNOSIS — J441 Chronic obstructive pulmonary disease with (acute) exacerbation: Secondary | ICD-10-CM | POA: Diagnosis not present

## 2020-05-15 DIAGNOSIS — F418 Other specified anxiety disorders: Secondary | ICD-10-CM

## 2020-05-15 DIAGNOSIS — J45991 Cough variant asthma: Secondary | ICD-10-CM | POA: Diagnosis not present

## 2020-05-15 DIAGNOSIS — N1832 Chronic kidney disease, stage 3b: Secondary | ICD-10-CM

## 2020-05-15 DIAGNOSIS — I2510 Atherosclerotic heart disease of native coronary artery without angina pectoris: Secondary | ICD-10-CM

## 2020-05-15 NOTE — Patient Instructions (Signed)
Visit Information  PATIENT GOALS: Goals Addressed            This Visit's Progress   . COMPLETED: COPD Exacerbation Improved       Timeframe:  Short-Term Goal Priority:  High Start Date:     05/05/20                        Expected End Date:  05/14/20                     Follow-up: 06/05/20  . Seek appropriate medical attention for exacerbation symptoms . Take medication as prescribed . Use O2 as directed . Call PCP with any new or worsening symptoms . Call RN Care Manager as needed [phone]    . Track and Manage My Blood Pressure-Hypertension       Timeframe:  Long-Range Goal Priority:  Medium Start Date:   05/14/20                          Expected End Date:   05/14/21                      Follow Up Date 06/05/20    . check blood pressure daily . write blood pressure results in a log or diary . ask questions to understand . Call PCP with any readings outside of recommended range . Call RN Care Manager with any nursing needs [phone]    Why is this important?    You won't feel high blood pressure, but it can still hurt your blood vessels.   High blood pressure can cause heart or kidney problems. It can also cause a stroke.   Making lifestyle changes like losing a little weight or eating less salt will help.   Checking your blood pressure at home and at different times of the day can help to control blood pressure.   If the doctor prescribes medicine remember to take it the way the doctor ordered.   Call the office if you cannot afford the medicine or if there are questions about it.     Notes:        Patient verbalizes understanding of instructions provided today and agrees to view in [place].   Follow Up Plan:  . Telephone follow up appointment with care management team member scheduled for: LCSW 05/15/20, [facility] 06/05/20 . The patient has been provided with contact information for the care management team and has been advised to call with any health related  questions or concerns.  . The patient will call PCP as advised to elevated blood pressure readings  . Next PCP appointment scheduled for: 05/19/20 with Dr [name]  [name], BSN, RN-BC [place] [facility]

## 2020-05-15 NOTE — Chronic Care Management (AMB) (Signed)
Chronic Care Management   CCM RN Visit Note  05/14/2020 Name: Kenneth Fuller MRN: [id number] DOB: 05-23-1945  Subjective: Kenneth Fuller is a 75 y.o. year old male who is a primary care patient of [name], [name], MD. The care management team was consulted for assistance with disease management and care coordination needs.    Engaged with patient by telephone for follow up visit in response to provider referral for case management and/or care coordination services.   Consent to Services:  The patient was given information about Chronic Care Management services, agreed to services, and gave verbal consent prior to initiation of services.  Please see initial visit note for detailed documentation.   Patient agreed to services and verbal consent obtained.   Assessment: Review of patient past medical history, allergies, medications, health status, including review of consultants reports, laboratory and other test data, was performed as part of comprehensive evaluation and provision of chronic care management services.   SDOH (Social Determinants of Health) assessments and interventions performed:    CCM Care Plan  Allergies  Allergen Reactions  . Amlodipine Besy-Benazepril Hcl Swelling and Other (See Comments)    Makes tongue swell (lotrel)  . Oxycodone Itching  . Phenergan [Promethazine Hcl] Other (See Comments)    "I can't remember."  . Hydrocodone Itching    Can tolerate in low doses    Outpatient Encounter Medications as of 05/14/2020  Medication Sig  . albuterol (ACCUNEB) 1.25 MG/3ML nebulizer solution Take 1 ampule by nebulization every 6 (six) hours as needed for wheezing.  [name] albuterol (VENTOLIN HFA) 108 (90 Base) MCG/ACT inhaler Inhale 2 puffs into the lungs every 6 (six) hours as needed for wheezing or shortness of breath.  [name] atorvastatin (LIPITOR) 40 MG tablet Take 40 mg by mouth daily. 1 Tablet Daily  . azelastine (OPTIVAR) 0.05 % ophthalmic solution Place 1 drop  into both eyes in the morning and at bedtime.  . bisoprolol (ZEBETA) 5 MG tablet Take 1/2 (one-half) tablet by mouth once daily  . Budeson-Glycopyrrol-Formoterol (BREZTRI AEROSPHERE) 160-9-4.8 MCG/ACT AERO Inhale 2 puffs into the lungs in the morning and at bedtime.  . Cholecalciferol (VITAMIN D3) 125 MCG (5000 UT) TABS Take 5,000 Units by mouth daily.  . colchicine 0.6 MG tablet Take 0.6 mg by mouth daily. As needed  . dapagliflozin propanediol (FARXIGA) 5 MG TABS tablet Take 1 tablet (5 mg total) by mouth daily.  . famotidine (PEPCID) 20 MG tablet Take 1 tablet (20 mg total) by mouth at bedtime.  . FEROSUL 325 (65 Fe) MG tablet Take 325 mg by mouth 3 (three) times daily.   . furosemide (LASIX) 40 MG tablet Take 40 mg by mouth daily as needed (fluid retention.).  [name] gabapentin (NEURONTIN) 100 MG capsule Take 1 capsule (100 mg total) by mouth 3 (three) times daily.  . hydrALAZINE (APRESOLINE) 50 MG tablet Take 1 tablet (50 mg total) by mouth 3 (three) times daily.  . isosorbide dinitrate (ISORDIL) 10 MG tablet TAKE 1 TABLET BY MOUTH THREE TIMES DAILY  . levofloxacin (LEVAQUIN) 500 MG tablet Take 1 tablet (500 mg total) by mouth daily. For 10 days  . mirabegron ER (MYRBETRIQ) 25 MG TB24 tablet Take 1 tablet (25 mg total) by mouth daily.  [name] omeprazole (PRILOSEC) 40 MG capsule Take 1 capsule (40 mg total) by mouth 2 (two) times daily at 8 am and 10 pm. Take 30-60 minutes prior to breakfast.  . polyethylene glycol (MIRALAX / GLYCOLAX) 17 g packet Take  17 g by mouth daily as needed for mild constipation.  . prednisoLONE acetate (PRED FORTE) 1 % ophthalmic suspension Place 1 drop into both eyes in the morning and at bedtime.  . predniSONE (DELTASONE) 20 MG tablet Take 3 tablets (60 mg total) by mouth daily with breakfast for 3 days, THEN 2 tablets (40 mg total) daily with breakfast for 3 days, THEN 1 tablet (20 mg total) daily with breakfast for 3 days, THEN 0.5 tablets (10 mg total) daily with breakfast  for 3 days.  . RESTASIS 0.05 % ophthalmic emulsion Place 1 drop into both eyes in the morning and at bedtime.  . sertraline (ZOLOFT) 100 MG tablet Take 1 tablet (100 mg total) by mouth daily.  . tamsulosin (FLOMAX) 0.4 MG CAPS capsule Take 1 capsule (0.4 mg total) by mouth daily after supper.  . [DISCONTINUED] cloNIDine (CATAPRES) 0.2 MG tablet Take 1 tablet (0.2 mg total) by mouth 3 (three) times daily.   No facility-administered encounter medications on file as of 05/14/2020.    Patient Active Problem List   Diagnosis Date Noted  . Right upper lobe pneumonia 05/07/2020  . Irritable larynx 05/07/2020  . AKI (acute kidney injury) ([place]) 06/05/2019  . Hyperkalemia 06/05/2019  . Acute renal failure (ARF) ([place]) 06/04/2019  . Dizziness 06/04/2019  . Near syncope 06/04/2019  . Insomnia   . CHF (congestive heart failure) ([place]) 01/06/2018  . Depression with anxiety 01/04/2018  . CKD (chronic kidney disease), stage III ([place]) 01/04/2018  . Chronic obstructive pulmonary disease ([place]) 01/04/2018  . Depression, recurrent ([place]) 11/11/2017  . Chronic diastolic heart failure ([place]) 04/13/2017  . Swelling of lower extremity 11/30/2016  . Obesity, Class III, BMI 40-49.9 (morbid obesity) ([place]) 08/21/2015  . Coronary artery disease involving native coronary artery of native heart without angina pectoris 12/05/2014  . Urinary urgency 10/15/2014  . Spinal stenosis, lumbar region, with neurogenic claudication 01/02/2014  . Hyperlipidemia associated with type 2 diabetes mellitus ([place]) 10/25/2013  . COLONIC POLYPS, ADENOMATOUS 02/25/2007  . Diabetes mellitus type II, non insulin dependent ([place]) 02/25/2007  . Gout 02/25/2007  . Essential hypertension 02/25/2007  . Cough variant asthma 02/25/2007  . OSA on CPAP 02/25/2007  . FATIGUE, CHRONIC 02/25/2007  . HEADACHE, CHRONIC 02/25/2007  . GERD (gastroesophageal reflux disease) 02/25/2007    Conditions to be addressed/monitored:CHF, HTN and COPD  Care  Plan : RNCM: Respiratory Disorder  Updates made by Ilean China, RN since 05/15/2020 12:00 AM    Problem: Symptom Exacerbation Resolved 05/14/2020  Priority: Medium    Goal: Symptom Exacerbation Improved Completed 05/14/2020  Start Date: 05/05/2020  This Visit's Progress: On track  Recent Progress: Not on track  Priority: Medium  Note:   Current Barriers:  . Care Coordination needs related to URI in a patient with COPD . Comorbidities: DM, HTN, CHF  Nurse Case Manager Clinical Goal(s):  . patient will work with PCP to address needs related to medical management of COPD exacerbation . patient will meet with RN Care Manager to address self-management of COPD  Interventions:  . 1:1 collaboration with [name], [name], MD regarding development and update of comprehensive plan of care as evidenced by provider attestation and co-signature . Inter-disciplinary care team collaboration (see longitudinal plan of care) . Evaluation of current treatment plan related to COPD and patient's adherence to plan as established by provider. . Chart reviewed including relevant office notes and lab results . Reviewed and discussed medications and recent medication changes in detail . Discussed  recent pulmonary and GI visits  . Reviewed and discussed upcoming appointments . Reviewed s/s of COPD exacerbation and when to seek appropriate medical attention . Previously provided with CCM Team contact information and encouraged patient to reach out as needed  Patient Goals/Self-Care Activities Over the next 10 days, patient will: . Seek appropriate medical attention for exacerbation symptoms . Take medication as prescribed . Use O2 as directed . Call PCP with any new or worsening symptoms . Call RN Care Manager as needed [phone] . Follow-up with pulmonologist and PCP    Care Plan : RNCM: Hypertension (Adult)  Updates made by Ilean China, RN since 05/15/2020 12:00 AM    Problem: Hypertension  (Hypertension)     Long-Range Goal: Hypertension Monitored   Start Date: 05/14/2020  This Visit's Progress: Not on track  Priority: Medium  Note:   Objective:  . Last practice recorded BP readings:  BP Readings from Last 3 Encounters:  05/13/20 (!) 116/56  05/07/20 136/86  05/05/20 (!) 196/95   Current Barriers:  . Uncontrolled hypertension  Case Manager Clinical Goal(s):  . patient will demonstrate improved adherence to prescribed treatment plan for hypertension as evidenced by taking all medications as prescribed, monitoring and recording blood pressure as directed, adhering to low sodium/DASH diet . patient will demonstrate improved health management independence as evidenced by checking blood pressure as directed and notifying PCP if SBP>160 or DBP > 90, taking all medications as prescribe, and adhering to a low sodium diet as discussed.  Interventions:  . Collaboration with [name], [name], MD regarding development and update of comprehensive plan of care as evidenced by provider attestation and co-signature . Inter-disciplinary care team collaboration (see longitudinal plan of care) . Evaluation of current treatment plan related to hypertension self management and patient's adherence to plan as established by provider. . Provided education to patient re: stroke prevention, s/s of heart attack and stroke, DASH diet, complications of uncontrolled blood pressure . Reviewed medications with patient and discussed importance of compliance . Discussed plans with patient for ongoing care management follow up and provided patient with direct contact information for care management team . Advised patient, providing education and rationale, to monitor blood pressure daily and record, calling PCP for findings outside established parameters.  . Reviewed scheduled/upcoming provider appointments including: PCP 05/19/20  Self-Care Activities: . Self administers medications as  prescribed . Attends all scheduled provider appointments . Calls provider office for new concerns, questions, or BP outside discussed parameters . Checks BP and records as discussed  Patient Goals: . check blood pressure daily . write blood pressure results in a log or diary . ask questions to understand . Call PCP with any readings outside of recommended range . Call [place] with any nursing needs [phone]     Follow Up Plan:  . Telephone follow up appointment with care management team member scheduled for: LCSW 05/15/20, [facility] 06/05/20 . The patient has been provided with contact information for the care management team and has been advised to call with any health related questions or concerns.  . The patient will call PCP as advised to elevated blood pressure readings  . Next PCP appointment scheduled for: 05/19/20 with Dr [name]  [name], BSN, RN-BC [place] [facility] [place] Management Direct Dial: [phone]

## 2020-05-15 NOTE — Patient Instructions (Signed)
Visit Information  PATIENT GOALS: Goals Addressed            This Visit's Progress   . manage health issues of client; manage anxiety issues of client       Timeframe:  Short Term Goal Priority:  Medium Progress; On Track  Start Date: 05/15/20                             Expected End Date:       08/14/20                Follow Up Date 06/17/20    Protect My Health (Patient)  Manage Health issues of client; manage anxiety issues of client    Why is this important?    Screening tests can find diseases early when they are easier to treat.   Your doctor or nurse will talk with you about which tests are important for you.   Getting shots for common diseases like the flu and shingles will help prevent them.     Patient Coping Skills/Strengths: Has family support Has no transport issues Attends scheduled medical appointments Takes medications as prescribed Completes ADLs independently  Patient Deficits: Some mobility challenges Pain issues Breathing challenges  Patient Goals:  Patient will attend all scheduled client medical appointments in next 30 days Patient will call RNCM or LCSW as needed for CCM support in next 30 days Patient will communicate regularly with his spouse in next 30 days to discuss needs of client  Follow Up Plan:  LCSW to call client or his spouse on 06/17/20      [name].Chuckie Mccathern MSW, LCSW Licensed Clinical Social Worker [facility] Care Management [phone]

## 2020-05-15 NOTE — Chronic Care Management (AMB) (Signed)
Chronic Care Management    Clinical Social Work Note  05/15/2020 Name: Kenneth Fuller MRN: [id number] DOB: May 16, 1945  Kenneth Fuller is a 75 y.o. year old male who is a primary care patient of [name], [name], MD. The CCM team was consulted to assist the patient with chronic disease management and/or care coordination needs related to: [name] .   Engaged with patient by telephone for follow up visit in response to provider referral for social work chronic care management and care coordination services.   Consent to Services:  The patient was given information about Chronic Care Management services, agreed to services, and gave verbal consent prior to initiation of services.  Please see initial visit note for detailed documentation.   Patient agreed to services and consent obtained.   Assessment: Review of patient past medical history, allergies, medications, and health status, including review of relevant consultants reports was performed today as part of a comprehensive evaluation and provision of chronic care management and care coordination services.     SDOH (Social Determinants of Health) assessments and interventions performed:    Advanced Directives Status: See Vyn[employer] application for related entries.  CCM Care Plan  Allergies  Allergen Reactions  . Amlodipine Besy-Benazepril Hcl Swelling and Other (See Comments)    Makes tongue swell (lotrel)  . Oxycodone Itching  . Phenergan [Promethazine Hcl] Other (See Comments)    "I can't remember."  . Hydrocodone Itching    Can tolerate in low doses    Outpatient Encounter Medications as of 05/15/2020  Medication Sig  . albuterol (ACCUNEB) 1.25 MG/3ML nebulizer solution Take 1 ampule by nebulization every 6 (six) hours as needed for wheezing.  [name] albuterol (VENTOLIN HFA) 108 (90 Base) MCG/ACT inhaler Inhale 2 puffs into the lungs every 6 (six) hours as needed for wheezing or shortness of breath.  [name] atorvastatin  (LIPITOR) 40 MG tablet Take 40 mg by mouth daily. 1 Tablet Daily  . azelastine (OPTIVAR) 0.05 % ophthalmic solution Place 1 drop into both eyes in the morning and at bedtime.  . bisoprolol (ZEBETA) 5 MG tablet Take 1/2 (one-half) tablet by mouth once daily  . Budeson-Glycopyrrol-Formoterol (BREZTRI AEROSPHERE) 160-9-4.8 MCG/ACT AERO Inhale 2 puffs into the lungs in the morning and at bedtime.  . Cholecalciferol (VITAMIN D3) 125 MCG (5000 UT) TABS Take 5,000 Units by mouth daily.  . cloNIDine (CATAPRES) 0.2 MG tablet Take 1 tablet (0.2 mg total) by mouth 3 (three) times daily. (NEEDS TO BE SEEN BEFORE NEXT REFILL)  . colchicine 0.6 MG tablet Take 0.6 mg by mouth daily. As needed  . dapagliflozin propanediol (FARXIGA) 5 MG TABS tablet Take 1 tablet (5 mg total) by mouth daily.  . famotidine (PEPCID) 20 MG tablet Take 1 tablet (20 mg total) by mouth at bedtime.  . FEROSUL 325 (65 Fe) MG tablet Take 325 mg by mouth 3 (three) times daily.   . furosemide (LASIX) 40 MG tablet Take 40 mg by mouth daily as needed (fluid retention.).  [name] gabapentin (NEURONTIN) 100 MG capsule Take 1 capsule (100 mg total) by mouth 3 (three) times daily.  . hydrALAZINE (APRESOLINE) 50 MG tablet Take 1 tablet (50 mg total) by mouth 3 (three) times daily.  . isosorbide dinitrate (ISORDIL) 10 MG tablet TAKE 1 TABLET BY MOUTH THREE TIMES DAILY  . levofloxacin (LEVAQUIN) 500 MG tablet Take 1 tablet (500 mg total) by mouth daily. For 10 days  . mirabegron ER (MYRBETRIQ) 25 MG TB24 tablet Take 1 tablet (  25 mg total) by mouth daily.  [name] omeprazole (PRILOSEC) 40 MG capsule Take 1 capsule (40 mg total) by mouth 2 (two) times daily at 8 am and 10 pm. Take 30-60 minutes prior to breakfast.  . polyethylene glycol (MIRALAX / GLYCOLAX) 17 g packet Take 17 g by mouth daily as needed for mild constipation.  . prednisoLONE acetate (PRED FORTE) 1 % ophthalmic suspension Place 1 drop into both eyes in the morning and at bedtime.  . predniSONE  (DELTASONE) 20 MG tablet Take 3 tablets (60 mg total) by mouth daily with breakfast for 3 days, THEN 2 tablets (40 mg total) daily with breakfast for 3 days, THEN 1 tablet (20 mg total) daily with breakfast for 3 days, THEN 0.5 tablets (10 mg total) daily with breakfast for 3 days.  . RESTASIS 0.05 % ophthalmic emulsion Place 1 drop into both eyes in the morning and at bedtime.  . sertraline (ZOLOFT) 100 MG tablet Take 1 tablet (100 mg total) by mouth daily.  . tamsulosin (FLOMAX) 0.4 MG CAPS capsule Take 1 capsule (0.4 mg total) by mouth daily after supper.   No facility-administered encounter medications on file as of 05/15/2020.    Patient Active Problem List   Diagnosis Date Noted  . Right upper lobe pneumonia 05/07/2020  . Irritable larynx 05/07/2020  . AKI (acute kidney injury) ([place]) 06/05/2019  . Hyperkalemia 06/05/2019  . Acute renal failure (ARF) ([place]) 06/04/2019  . Dizziness 06/04/2019  . Near syncope 06/04/2019  . Insomnia   . CHF (congestive heart failure) ([place]) 01/06/2018  . Depression with anxiety 01/04/2018  . CKD (chronic kidney disease), stage III ([place]) 01/04/2018  . Chronic obstructive pulmonary disease ([place]) 01/04/2018  . Depression, recurrent ([place]) 11/11/2017  . Chronic diastolic heart failure ([place]) 04/13/2017  . Swelling of lower extremity 11/30/2016  . Obesity, Class III, BMI 40-49.9 (morbid obesity) ([place]) 08/21/2015  . Coronary artery disease involving native coronary artery of native heart without angina pectoris 12/05/2014  . Urinary urgency 10/15/2014  . Spinal stenosis, lumbar region, with neurogenic claudication 01/02/2014  . Hyperlipidemia associated with type 2 diabetes mellitus ([place]) 10/25/2013  . COLONIC POLYPS, ADENOMATOUS 02/25/2007  . Diabetes mellitus type II, non insulin dependent ([place]) 02/25/2007  . Gout 02/25/2007  . Essential hypertension 02/25/2007  . Cough variant asthma 02/25/2007  . OSA on CPAP 02/25/2007  . FATIGUE, CHRONIC 02/25/2007  .  HEADACHE, CHRONIC 02/25/2007  . GERD (gastroesophageal reflux disease) 02/25/2007    Conditions to be addressed/monitored: Monitor client management of his ongoing health needs   Care Plan : LCSW Care plan  Updates made by Katha Cabal, LCSW since 05/15/2020 12:00 AM    Problem: Coping Skills (General Plan of Care)     Goal: Manage Health needs and manage anxiety or stress issues related to health needs   Start Date: 05/15/2020  Expected End Date: 08/14/2020  This Visit's Progress: On track  Priority: Medium  Note:   Current barriers:   . Patient in need of assistance with connecting to community resources for possible assistance in managing his ongoing medical needs . Patient is unable to independently navigate community resource options without care coordination support . Mobility issues . Pain issues  Clinical Goals:  patient will work with SW in next 30 days to address concerns related to health issues of client and management of health issues of client Patient will call RNCM or LCSW as needed in next 30 days for CCM support Patient will attend all  scheduled client medical appointments in next 30 days  Clinical Interventions:  . Collaboration with [name], [name], MD regarding development and update of comprehensive plan of care as evidenced by provider attestation and co-signature . Assessment of needs, barriers  of client  . Talked with client about breathing issues of client . Talked with client about his appointment next week with pulmonologist . Talked with client about vision issues of client . Talked with client about support with RNCM . Talked with client about ADLs completion . Talked with client about recent client appointments . Talked with client about sleeping issues of client . Encouraged client to call [facility] Triage nurse as needed for nursing support  Patient Coping Skills/Strengths: Has family support Has no transport issues Attends scheduled medical  appointments Takes medications as prescribed Completes ADLs independently  Patient Deficits: Some mobility challenges Pain issues Breathing challenges  Patient Goals:  Patient will attend all scheduled client medical appointments in next 30 days Patient will call RNCM or LCSW as needed for CCM support in next 30 days Patient will communicate regularly with his spouse in next 30 days to discuss needs of client  Follow Up Plan:  LCSW to call client or his spouse on 06/17/20    [name].Al Gagen MSW, LCSW Licensed Clinical Social Worker [facility] Care Management [phone]

## 2020-05-16 LAB — RESPIRATORY CULTURE OR RESPIRATORY AND SPUTUM CULTURE
MICRO NUMBER:: 11732886
RESULT:: NORMAL
SPECIMEN QUALITY:: ADEQUATE

## 2020-05-16 NOTE — Progress Notes (Signed)
Sputum culture was normal

## 2020-05-19 ENCOUNTER — Encounter: Payer: Self-pay | Admitting: Family Medicine

## 2020-05-19 ENCOUNTER — Ambulatory Visit (INDEPENDENT_AMBULATORY_CARE_PROVIDER_SITE_OTHER): Payer: PPO | Admitting: Family Medicine

## 2020-05-19 ENCOUNTER — Other Ambulatory Visit: Payer: Self-pay

## 2020-05-19 ENCOUNTER — Ambulatory Visit (INDEPENDENT_AMBULATORY_CARE_PROVIDER_SITE_OTHER): Payer: PPO

## 2020-05-19 ENCOUNTER — Encounter: Payer: Self-pay | Admitting: *Deleted

## 2020-05-19 ENCOUNTER — Ambulatory Visit (HOSPITAL_COMMUNITY)
Admission: RE | Admit: 2020-05-19 | Discharge: 2020-05-19 | Disposition: A | Discharge status: Home / Self Care | Payer: PPO | Source: Ambulatory Visit | Attending: Family Medicine | Admitting: Family Medicine

## 2020-05-19 VITALS — BP 132/77 | HR 67 | Ht 67.0 in | Wt 263.0 lb

## 2020-05-19 DIAGNOSIS — Z0389 Encounter for observation for other suspected diseases and conditions ruled out: Secondary | ICD-10-CM | POA: Diagnosis not present

## 2020-05-19 DIAGNOSIS — R0789 Other chest pain: Secondary | ICD-10-CM

## 2020-05-19 DIAGNOSIS — R0602 Shortness of breath: Secondary | ICD-10-CM

## 2020-05-19 DIAGNOSIS — R0609 Other forms of dyspnea: Secondary | ICD-10-CM | POA: Diagnosis not present

## 2020-05-19 DIAGNOSIS — R0600 Dyspnea, unspecified: Secondary | ICD-10-CM | POA: Diagnosis not present

## 2020-05-19 MED ORDER — IOHEXOL 350 MG/ML SOLN
80.0000 mL | Freq: Once | INTRAVENOUS | Status: AC | PRN
  Administered 2020-05-19: 80 mL via INTRAVENOUS

## 2020-05-19 NOTE — Progress Notes (Signed)
BP 132/77   Pulse 67   Ht _0  (1.702 m)   Wt 263 lb (119.3 kg)   SpO2 95%   BMI 41.19 kg/m    Subjective:   Patient ID: Kenneth Fuller, male    DOB: [birth year], 75 y.o.   MRN: [id number]  HPI: Kenneth Fuller is a 75 y.o. male presenting on 05/19/2020 for Medical Management of Chronic Issues and Shortness of Breath   HPI Patient is coming in with continued shortness of breath on exertion that is been bothering him over the past few weeks.  He was found to have a spot in his lungs that was possibly pneumonia and was treated for such first with an antibiotic and some steroids and then saw pulmonology and had another antibiotic and some stronger steroids.  He feels like he is improved but still feels shortness of breath.  He does have a mild cough but no wheezing.  He does feel like he cannot walk even short distances without feeling significantly short of breath.  He has been keeping a track on his weights and says that he is not up on fluid or his weight.  Relevant past medical, surgical, family and social history reviewed and updated as indicated. Interim medical history since our last visit reviewed. Allergies and medications reviewed and updated.  Review of Systems  Constitutional: Negative for chills and fever.  Respiratory: Positive for cough and shortness of breath. Negative for wheezing.   Cardiovascular: Negative for chest pain and leg swelling.  Musculoskeletal: Negative for back pain and gait problem.  Skin: Negative for rash.  All other systems reviewed and are negative.   Per HPI unless specifically indicated above   Allergies as of 05/19/2020      Reactions   Amlodipine Besy-benazepril Hcl Swelling, Other (See Comments)   Makes tongue swell (lotrel)   Oxycodone Itching   Phenergan [promethazine Hcl] Other (See Comments)   "I can't remember."   Hydrocodone Itching   Can tolerate in low doses      Medication List       Accurate as of May 19, 2020   3:58 PM. If you have any questions, ask your nurse or doctor.        STOP taking these medications   levofloxacin 500 MG tablet Commonly known as: LEVAQUIN Stopped by: [name] Roshun Klingensmith, MD   predniSONE 20 MG tablet Commonly known as: DELTASONE Stopped by: [name] Kaeli Nichelson, MD     TAKE these medications   albuterol 1.25 MG/3ML nebulizer solution Commonly known as: ACCUNEB Take 1 ampule by nebulization every 6 (six) hours as needed for wheezing.   albuterol 108 (90 Base) MCG/ACT inhaler Commonly known as: VENTOLIN HFA Inhale 2 puffs into the lungs every 6 (six) hours as needed for wheezing or shortness of breath.   atorvastatin 40 MG tablet Commonly known as: LIPITOR Take 40 mg by mouth daily. 1 Tablet Daily   azelastine 0.05 % ophthalmic solution Commonly known as: OPTIVAR Place 1 drop into both eyes in the morning and at bedtime.   bisoprolol 5 MG tablet Commonly known as: ZEBETA Take 1/2 (one-half) tablet by mouth once daily   Breztri Aerosphere 160-9-4.8 MCG/ACT Aero Generic drug: Budeson-Glycopyrrol-Formoterol Inhale 2 puffs into the lungs in the morning and at bedtime.   cloNIDine 0.2 MG tablet Commonly known as: CATAPRES Take 1 tablet (0.2 mg total) by mouth 3 (three) times daily. (NEEDS TO BE SEEN BEFORE NEXT REFILL)   colchicine 0.6  MG tablet Take 0.6 mg by mouth daily. As needed   dapagliflozin propanediol 5 MG Tabs tablet Commonly known as: Farxiga Take 1 tablet (5 mg total) by mouth daily.   famotidine 20 MG tablet Commonly known as: PEPCID Take 1 tablet (20 mg total) by mouth at bedtime.   FeroSul 325 (65 FE) MG tablet Generic drug: ferrous sulfate Take 325 mg by mouth 3 (three) times daily.   furosemide 40 MG tablet Commonly known as: LASIX Take 40 mg by mouth daily as needed (fluid retention.).   gabapentin 100 MG capsule Commonly known as: NEURONTIN Take 1 capsule (100 mg total) by mouth 3 (three) times daily.   hydrALAZINE 50 MG  tablet Commonly known as: APRESOLINE Take 1 tablet (50 mg total) by mouth 3 (three) times daily.   isosorbide dinitrate 10 MG tablet Commonly known as: ISORDIL TAKE 1 TABLET BY MOUTH THREE TIMES DAILY   mirabegron ER 25 MG Tb24 tablet Commonly known as: Myrbetriq Take 1 tablet (25 mg total) by mouth daily.   omeprazole 40 MG capsule Commonly known as: PRILOSEC Take 1 capsule (40 mg total) by mouth 2 (two) times daily at 8 am and 10 pm. Take 30-60 minutes prior to breakfast.   polyethylene glycol 17 g packet Commonly known as: MIRALAX / GLYCOLAX Take 17 g by mouth daily as needed for mild constipation.   prednisoLONE acetate 1 % ophthalmic suspension Commonly known as: PRED FORTE Place 1 drop into both eyes in the morning and at bedtime.   Restasis 0.05 % ophthalmic emulsion Generic drug: cycloSPORINE Place 1 drop into both eyes in the morning and at bedtime.   sertraline 100 MG tablet Commonly known as: ZOLOFT Take 1 tablet (100 mg total) by mouth daily.   tamsulosin 0.4 MG Caps capsule Commonly known as: FLOMAX Take 1 capsule (0.4 mg total) by mouth daily after supper.   Vitamin D3 125 MCG (5000 UT) Tabs Take 5,000 Units by mouth daily.        Objective:   BP 132/77   Pulse 67   Ht _0  (1.702 m)   Wt 263 lb (119.3 kg)   SpO2 95%   BMI 41.19 kg/m   Wt Readings from Last 3 Encounters:  05/19/20 263 lb (119.3 kg)  05/13/20 264 lb (119.7 kg)  05/07/20 264 lb (119.7 kg)    Physical Exam Vitals and nursing note reviewed.  Constitutional:      General: He is not in acute distress.    Appearance: He is well-developed. He is not diaphoretic.  Eyes:     General: No scleral icterus.    Conjunctiva/sclera: Conjunctivae normal.  Neck:     Thyroid: No thyromegaly.  Cardiovascular:     Rate and Rhythm: Normal rate and regular rhythm.     Heart sounds: Normal heart sounds. No murmur heard.   Pulmonary:     Effort: Pulmonary effort is normal. No respiratory  distress.     Breath sounds: Normal breath sounds. No wheezing, rhonchi or rales.  Chest:     Chest wall: No tenderness.  Musculoskeletal:        General: No swelling. Normal range of motion.     Cervical back: Neck supple.  Lymphadenopathy:     Cervical: No cervical adenopathy.  Skin:    General: Skin is warm and dry.     Findings: No rash.  Neurological:     Mental Status: He is alert and oriented to person, place, and time.  Coordination: Coordination normal.  Psychiatric:        Behavior: Behavior normal.    Chest x-ray: Slight improvement on right upper lobe but only minimal, will do CT chest   Assessment & Plan:   Problem List Items Addressed This Visit   None   Visit Diagnoses    Shortness of breath    -  Primary   Relevant Orders   DG Chest 2 View   CT ANGIO CHEST PE W OR WO CONTRAST   Other chest pain       Relevant Orders   CT ANGIO CHEST PE W OR WO CONTRAST      Patient has a concerning lesion and also concerning for the shortness of breath.  Patient's breathing just has not improved despite treatments and x-ray shows lesions so want to do a CT to rule out possible cancerous lesions versus pulmonary embolism Follow up plan: Return if symptoms worsen or fail to improve, for Needs appointment for his routine diabetes follow-up, 2 to 4 weeks in the future would be good..  Counseling provided for all of the vaccine components No orders of the defined types were placed in this encounter.   Caryl Pina, MD [facility] 05/19/2020, 3:58 PM

## 2020-05-21 ENCOUNTER — Telehealth: Payer: Self-pay

## 2020-05-21 NOTE — Telephone Encounter (Signed)
Spoke with patient about bringing SD Card to visit tomorrow with [employer]. Nothing further needed.

## 2020-05-22 ENCOUNTER — Other Ambulatory Visit: Payer: Self-pay

## 2020-05-22 ENCOUNTER — Encounter: Payer: Self-pay | Admitting: Adult Health

## 2020-05-22 ENCOUNTER — Ambulatory Visit (INDEPENDENT_AMBULATORY_CARE_PROVIDER_SITE_OTHER): Payer: PPO | Admitting: Adult Health

## 2020-05-22 DIAGNOSIS — J449 Chronic obstructive pulmonary disease, unspecified: Secondary | ICD-10-CM

## 2020-05-22 DIAGNOSIS — Z9989 Dependence on other enabling machines and devices: Secondary | ICD-10-CM | POA: Diagnosis not present

## 2020-05-22 DIAGNOSIS — I5032 Chronic diastolic (congestive) heart failure: Secondary | ICD-10-CM

## 2020-05-22 DIAGNOSIS — G4733 Obstructive sleep apnea (adult) (pediatric): Secondary | ICD-10-CM

## 2020-05-22 NOTE — Assessment & Plan Note (Signed)
Appears compensated without evidence of volume overload on exam.  Continue current regimen 

## 2020-05-22 NOTE — Assessment & Plan Note (Signed)
COPD.  Patient has minimum smoking history may have more underlying chronic obstructive asthma. Will continue on triple therapy maintenance inhaler. Check PFTs on return  Plan  Patient Instructions  Continue on BREZTRI 2 puffs Twice daily Albuterol inhaler As needed   Activity as tolerated.   Continue on CPAP at bedtime it is very important for you to wear this each night. Order for SD card . Bring to each visit .  Work on healthy weight .  Do not drive if sleepy  Follow up with Dr. Chase Caller in 3 months with PFT  and As needed   Please contact office for sooner follow up if symptoms do not improve or worsen or seek emergency care

## 2020-05-22 NOTE — Patient Instructions (Addendum)
Continue on BREZTRI 2 puffs Twice daily Albuterol inhaler As needed   Activity as tolerated.   Continue on CPAP at bedtime it is very important for you to wear this each night. Order for SD card . Bring to each visit .  Work on healthy weight .  Do not drive if sleepy  Follow up with Dr. Chase Caller in 3 months with PFT  and As needed   Please contact office for sooner follow up if symptoms do not improve or worsen or seek emergency care

## 2020-05-22 NOTE — Addendum Note (Signed)
Addended by: Geanie Logan on: 05/22/2020 11:58 AM   Modules accepted: Orders

## 2020-05-22 NOTE — Assessment & Plan Note (Signed)
Patient has restarted CPAP.  Encouraged on compliance.  Continue on nocturnal CPAP.  ST card for his CPAP has been ordered.  We will do a download on his return visit

## 2020-05-22 NOTE — Assessment & Plan Note (Signed)
Recently treated for a right upper lobe pneumonia.  He has completed a full 10-day course of Levaquin.  Clinically is improving.  Recent chest x-ray and CT chest showed no residual pneumonia.  Also was negative for PE.

## 2020-05-22 NOTE — Progress Notes (Signed)
_0  ID: Kenneth Fuller, male    DOB: [birth year], 75 y.o.   MRN: [id number]  Chief Complaint  Patient presents with  . Follow-up    Referring provider: [name], [name], MD  HPI: 75 year old former smoker  (7 Pack yr )  followed for Chronic obstructive Asthma / COPD, obstructive sleep apnea Medical history significant for chronic diastolic heart failure coronary artery disease diabetes, chronic stage III kidney disease.  TEST/EVENTS :  Echocardiogram October 2018 showed grade 2 diastolic dysfunction  cardiac catheterization October 2018 that was nonobstructive coronary artery disease.  Walking desaturation test on 01/26/2017 185 feet x 3 laps on ROOM AIR: did not desaturate. Rest pulse ox was 99%, final pulse ox was 97%. HR response 67/min at rest to 81/min at peak exertion   pulmonary function test on December 23, 2016 that shows restriction with reduced diffusion capacity. Total lung capacity 72% FVC, 1.6 L / 40% and DLCO of 19.4/68%.  PFT February 25, 2017 showed FEV1 45%, ratio 68, FVC 48% consistent with moderate to severe airflow obstruction  Feno 01/26/2017 ->8ppb  High resolution CT chest February 02 2017- for ILD  CT sinus March 09, 2017 clear sinuses   Previous sleep study showed very severe OSA, AHI >100. Split night study showed optimal control with CPAP 5-20  05/22/2020 Follow up : PNA , COPD/Asthma , OSA  Patient returns for a 2-week follow-up.  Patient was seen last visit to reestablish care at the pulmonary clinic.  He had last been seen in 2019.  Patient had underlying COPD on cough variant asthma.  Also obstructive sleep apnea. Last visit patient had recently been diagnosed with a right upper lobe pneumonia.  He was started on Levaquin for 10 days.  And a Medrol Dosepak.  He also has been on Breztrri for his COPD. P Last visit patient was recommended to finish his antibiotics.  And was changed from Medrol to a prednisone taper.Sputum  culture was negative. Since last visit patient is feeling better .  Still has low energy.  Gets winded with activities.  No chest pain hemoptysis fever orthopnea.  Appetite is good with no nausea vomiting or diarrhea. Chest x-ray May 20, 2020 showed no acute process. CT chest angio was done due to ongoing dyspnea. Negative for PE or acute pulmonary process.    Walk test in the office today shows no significant desaturations on room air.  O2 saturations maintained 93% and above on room air ambulating.  Patient has underlying obstructive sleep apnea.  Has recently restarted his CPAP.  He says he is wearing each night about 7 hours.  Was unable to wear it while he was sick with cough and congestion.  Patient says he feels rested.  CPAP download was requested.  He does not have a CPAP ST card.  This was ordered through his homecare company.   Allergies  Allergen Reactions  . Amlodipine Besy-Benazepril Hcl Swelling and Other (See Comments)    Makes tongue swell (lotrel)  . Oxycodone Itching  . Phenergan [Promethazine Hcl] Other (See Comments)    "I can't remember."  . Hydrocodone Itching    Can tolerate in low doses    Immunization History  Administered Date(s) Administered  . Fluad Quad(high Dose 65+) 11/15/2018  . Influenza Whole 10/09/2008  . Influenza, High Dose Seasonal PF 11/29/2016, 11/08/2017  . Influenza,inj,Quad PF,6+ Mos 12/04/2013, 12/04/2014, 12/23/2015  . Pneumococcal Conjugate-13 12/04/2013  . Pneumococcal Polysaccharide-23 08/26/2011  . Td 11/08/2017    Past  Medical History:  Diagnosis Date  . Allergy   . Asthma   . CAD (coronary artery disease)   . CHF (congestive heart failure) ([place])   . Diabetes mellitus   . Fibromyalgia   . GERD (gastroesophageal reflux disease)   . GI bleeding   . Gout   . Hyperlipidemia   . Hypertension   . Hypogonadism male   . Insomnia   . MRSA cellulitis   . Neuropathy   . Obesity   . Shortness of breath dyspnea    with  exertion   . Sleep apnea    cpap- 14   . Wheezing    no asthma diagnosis    Tobacco History: Social History   Tobacco Use  Smoking Status Former Smoker  . Quit date: 02/08/1969  . Years since quitting: 51.3  Smokeless Tobacco Former Systems developer  . Quit date: 61   Counseling given: Not Answered   Outpatient Medications Prior to Visit  Medication Sig Dispense Refill  . albuterol (ACCUNEB) 1.25 MG/3ML nebulizer solution Take 1 ampule by nebulization every 6 (six) hours as needed for wheezing.    [name] albuterol (VENTOLIN HFA) 108 (90 Base) MCG/ACT inhaler Inhale 2 puffs into the lungs every 6 (six) hours as needed for wheezing or shortness of breath. 1 each 2  . atorvastatin (LIPITOR) 40 MG tablet Take 40 mg by mouth daily. 1 Tablet Daily    . azelastine (OPTIVAR) 0.05 % ophthalmic solution Place 1 drop into both eyes in the morning and at bedtime.    . bisoprolol (ZEBETA) 5 MG tablet Take 1/2 (one-half) tablet by mouth once daily 45 tablet 0  . Budeson-Glycopyrrol-Formoterol (BREZTRI AEROSPHERE) 160-9-4.8 MCG/ACT AERO Inhale 2 puffs into the lungs in the morning and at bedtime. 42.8 g 4  . Cholecalciferol (VITAMIN D3) 125 MCG (5000 UT) TABS Take 5,000 Units by mouth daily.    . cloNIDine (CATAPRES) 0.2 MG tablet Take 1 tablet (0.2 mg total) by mouth 3 (three) times daily. (NEEDS TO BE SEEN BEFORE NEXT REFILL) 90 tablet 0  . colchicine 0.6 MG tablet Take 0.6 mg by mouth daily. As needed    . dapagliflozin propanediol (FARXIGA) 5 MG TABS tablet Take 1 tablet (5 mg total) by mouth daily. 90 tablet 3  . famotidine (PEPCID) 20 MG tablet Take 1 tablet (20 mg total) by mouth at bedtime. 30 tablet 3  . FEROSUL 325 (65 Fe) MG tablet Take 325 mg by mouth 3 (three) times daily.     . furosemide (LASIX) 40 MG tablet Take 40 mg by mouth daily as needed (fluid retention.).    [name] gabapentin (NEURONTIN) 100 MG capsule Take 1 capsule (100 mg total) by mouth 3 (three) times daily. 90 capsule 1  . hydrALAZINE  (APRESOLINE) 50 MG tablet Take 1 tablet (50 mg total) by mouth 3 (three) times daily. 90 tablet 3  . isosorbide dinitrate (ISORDIL) 10 MG tablet TAKE 1 TABLET BY MOUTH THREE TIMES DAILY 90 tablet 5  . mirabegron ER (MYRBETRIQ) 25 MG TB24 tablet Take 1 tablet (25 mg total) by mouth daily. 30 tablet 3  . omeprazole (PRILOSEC) 40 MG capsule Take 1 capsule (40 mg total) by mouth 2 (two) times daily at 8 am and 10 pm. Take 30-60 minutes prior to breakfast. 60 capsule 5  . polyethylene glycol (MIRALAX / GLYCOLAX) 17 g packet Take 17 g by mouth daily as needed for mild constipation.    . prednisoLONE acetate (PRED FORTE) 1 % ophthalmic suspension  Place 1 drop into both eyes in the morning and at bedtime.    . RESTASIS 0.05 % ophthalmic emulsion Place 1 drop into both eyes in the morning and at bedtime.    . sertraline (ZOLOFT) 100 MG tablet Take 1 tablet (100 mg total) by mouth daily. 90 tablet 3  . tamsulosin (FLOMAX) 0.4 MG CAPS capsule Take 1 capsule (0.4 mg total) by mouth daily after supper. 90 capsule 3   No facility-administered medications prior to visit.     Review of Systems:   Constitutional:   No  weight loss, night sweats,  Fevers, chills,  +fatigue, or  lassitude.  HEENT:   No headaches,  Difficulty swallowing,  Tooth/dental problems, or  Sore throat,                No sneezing, itching, ear ache, nasal congestion, post nasal drip,   CV:  No chest pain,  Orthopnea, PND, swelling in lower extremities, anasarca, dizziness, palpitations, syncope.   GI  No heartburn, indigestion, abdominal pain, nausea, vomiting, diarrhea, change in bowel habits, loss of appetite, bloody stools.   Resp:  .  No chest wall deformity  Skin: no rash or lesions.  GU: no dysuria, change in color of urine, no urgency or frequency.  No flank pain, no hematuria   MS:  No joint pain or swelling.  No decreased range of motion.  No back pain.    Physical Exam  BP 126/74 (BP Location: Left Arm, Cuff Size:  Normal)   Pulse 61   Temp (!) 97.4 F (36.3 C) (Temporal)   Ht _0  (1.702 m)   Wt 266 lb 9.6 oz (120.9 kg)   SpO2 97% Comment: RA  BMI 41.76 kg/m   GEN: A/Ox3; pleasant , NAD, well nourished    HEENT:  [place]/AT,    NOSE-clear, THROAT-clear, no lesions, no postnasal drip or exudate noted.   NECK:  Supple w/ fair ROM; no JVD; normal carotid impulses w/o bruits; no thyromegaly or nodules palpated; no lymphadenopathy.    RESP  Clear  P & A; w/o, wheezes/ rales/ or rhonchi. no accessory muscle use, no dullness to percussion  CARD:  RRR, no m/r/g, tr  peripheral edema, pulses intact, no cyanosis or clubbing.  GI:   Soft & nt; nml bowel sounds; no organomegaly or masses detected.   Musco: Warm bil, no deformities or joint swelling noted.   Neuro: alert, no focal deficits noted.    Skin: Warm, no lesions or rashes    Lab Results:  CBC    Component Value Date/Time   WBC 5.6 05/05/2020 1656   WBC 10.2 11/01/2019 2121   RBC 5.88 (H) 05/05/2020 1656   RBC 5.83 (H) 11/01/2019 2121   HGB 16.7 05/05/2020 1656   HCT 50.4 05/05/2020 1656   PLT 181 05/05/2020 1656   MCV 86 05/05/2020 1656   MCH 28.4 05/05/2020 1656   MCH 26.6 11/01/2019 2121   MCHC 33.1 05/05/2020 1656   MCHC 31.0 11/01/2019 2121   RDW 15.0 05/05/2020 1656   LYMPHSABS 1.2 05/05/2020 1656   MONOABS 0.9 06/03/2019 2137   EOSABS 0.2 05/05/2020 1656   BASOSABS 0.1 05/05/2020 1656    BMET    Component Value Date/Time   NA 141 05/05/2020 1656   K 5.1 05/05/2020 1656   CL 100 05/05/2020 1656   CO2 21 05/05/2020 1656   GLUCOSE 137 (H) 05/05/2020 1656   GLUCOSE 104 (H) 11/01/2019 2121   BUN 19  05/05/2020 1656   CREATININE 1.17 05/05/2020 1656   CREATININE 1.04 06/12/2012 1232   CALCIUM 9.0 05/05/2020 1656   GFRNONAA >60 11/01/2019 2121   GFRNONAA 74 06/12/2012 1232   GFRAA >60 11/01/2019 2121   GFRAA 86 06/12/2012 1232    BNP    Component Value Date/Time   BNP 174.0 (H) 04/03/2019 1901    ProBNP     Component Value Date/Time   PROBNP 219.0 (H) 05/07/2020 1226    Imaging: DG Chest 2 View  Result Date: 05/20/2020 CLINICAL DATA:  76 year old male with a history of shortness of breath EXAM: CHEST - 2 VIEW COMPARISON:  05/05/2020 FINDINGS: Cardiomediastinal silhouette unchanged in size and contour. No evidence of central vascular congestion. No interlobular septal thickening. No pneumothorax or pleural effusion. No confluent airspace disease. Surgical changes of the cervical region incompletely imaged. Degenerative changes spine. No acute displaced fracture IMPRESSION: Negative for acute cardiopulmonary disease Electronically Signed   By: Corrie Mckusick D.O.   On: 05/20/2020 13:08   DG Chest 2 View  Result Date: 05/06/2020 CLINICAL DATA:  Cough and congest pain with wheezing EXAM: CHEST - 2 VIEW COMPARISON:  January 22, 2020 FINDINGS: There is an area of rather subtle increased opacity in the right upper lobe. Lungs elsewhere are clear. Heart size and pulmonary vascularity are normal. No adenopathy. There is degenerative change in the thoracic spine. Postoperative change noted in the lower cervical region. IMPRESSION: Subtle opacity right upper lobe which may represent earliest changes of pneumonia. Lungs otherwise clear. Cardiac silhouette within normal limits. These results will be called to the ordering clinician or representative by the Radiologist Assistant, and communication documented in the PACS or [employer]. Electronically Signed   By: Lowella Grip III M.D.   On: 05/06/2020 16:14   CT ANGIO CHEST PE W OR WO CONTRAST  Result Date: 05/19/2020 CLINICAL DATA:  Dyspnea on exertion for 2 weeks, history of pneumonia EXAM: CT ANGIOGRAPHY CHEST WITH CONTRAST TECHNIQUE: Multidetector CT imaging of the chest was performed using the standard protocol during bolus administration of intravenous contrast. Multiplanar CT image reconstructions and MIPs were obtained to evaluate the vascular  anatomy. CONTRAST:  19m OMNIPAQUE IOHEXOL 350 MG/ML SOLN COMPARISON:  05/05/2020, 09/22/2018 FINDINGS: Cardiovascular: This is a technically adequate evaluation of the pulmonary vasculature. No filling defects or pulmonary emboli. The heart is unremarkable without pericardial effusion. Normal caliber of the thoracic aorta. Mild atherosclerosis of the aortic arch. Mediastinum/Nodes: No enlarged mediastinal, hilar, or axillary lymph nodes. Thyroid gland, trachea, and esophagus demonstrate no significant findings. Lungs/Pleura: No acute airspace disease, effusion, or pneumothorax. The central airways are patent. Upper Abdomen: No acute abnormality. Musculoskeletal: No acute or destructive bony lesions. Reconstructed images demonstrate no additional findings. Review of the MIP images confirms the above findings. IMPRESSION: 1. No evidence of pulmonary embolus. 2. No acute intrathoracic process. 3.  Aortic Atherosclerosis (ICD10-I70.0). Electronically Signed   By: MRanda NgoM.D.   On: 05/19/2020 18:45    betamethasone acetate-betamethasone sodium phosphate (CELESTONE) injection 6 mg    Date Action Dose Route User   05/05/2020 1650 Given 6 mg Intramuscular (Right Upper Outer Quadrant) Boothe, Jaime B, LPN    cefTRIAXone (ROCEPHIN) injection 1 g    Date Action Dose Route User   05/05/2020 1651 Given 1 g Intramuscular (Left Upper Outer Quadrant) Boothe, Jaime B, LPN    methylPREDNISolone acetate (DEPO-MEDROL) injection 120 mg    Date Action Dose Route User   05/07/2020 1216 Given 120 mg  Intramuscular (Left Ventrogluteal) Birder Robson, RN      PFT Results Latest Ref Rng & Units 02/25/2017 12/23/2016  FVC-Pre L 1.77 1.58  FVC-Predicted Pre % 45 40  FVC-Post L 1.89 2.06  FVC-Predicted Post % 48 52  Pre FEV1/FVC % % 67 84  Post FEV1/FCV % % 68 79  FEV1-Pre L 1.19 1.32  FEV1-Predicted Pre % 41 45  FEV1-Post L 1.29 1.61  DLCO uncorrected ml/min/mmHg - 19.37  DLCO UNC% % - 68  DLVA Predicted % -  126  TLC L - 4.64  TLC % Predicted % - 72  RV % Predicted % - 115    Lab Results  Component Value Date   NITRICOXIDE 12 02/25/2017        Assessment & Plan:   No problem-specific Assessment & Plan notes found for this encounter.     Rexene Edison, NP 05/22/2020

## 2020-05-29 ENCOUNTER — Other Ambulatory Visit: Payer: Self-pay | Admitting: Family Medicine

## 2020-05-29 DIAGNOSIS — F339 Major depressive disorder, recurrent, unspecified: Secondary | ICD-10-CM

## 2020-06-04 ENCOUNTER — Other Ambulatory Visit: Payer: Self-pay | Admitting: Family Medicine

## 2020-06-04 DIAGNOSIS — I10 Essential (primary) hypertension: Secondary | ICD-10-CM

## 2020-06-04 DIAGNOSIS — I5042 Chronic combined systolic (congestive) and diastolic (congestive) heart failure: Secondary | ICD-10-CM

## 2020-06-05 ENCOUNTER — Ambulatory Visit: Payer: PPO | Admitting: *Deleted

## 2020-06-05 DIAGNOSIS — F418 Other specified anxiety disorders: Secondary | ICD-10-CM | POA: Diagnosis not present

## 2020-06-05 DIAGNOSIS — J45991 Cough variant asthma: Secondary | ICD-10-CM | POA: Diagnosis not present

## 2020-06-05 DIAGNOSIS — I10 Essential (primary) hypertension: Secondary | ICD-10-CM

## 2020-06-05 DIAGNOSIS — I5042 Chronic combined systolic (congestive) and diastolic (congestive) heart failure: Secondary | ICD-10-CM

## 2020-06-05 DIAGNOSIS — E119 Type 2 diabetes mellitus without complications: Secondary | ICD-10-CM

## 2020-06-05 DIAGNOSIS — G4733 Obstructive sleep apnea (adult) (pediatric): Secondary | ICD-10-CM

## 2020-06-05 DIAGNOSIS — F339 Major depressive disorder, recurrent, unspecified: Secondary | ICD-10-CM | POA: Diagnosis not present

## 2020-06-05 DIAGNOSIS — J441 Chronic obstructive pulmonary disease with (acute) exacerbation: Secondary | ICD-10-CM

## 2020-06-05 DIAGNOSIS — I2510 Atherosclerotic heart disease of native coronary artery without angina pectoris: Secondary | ICD-10-CM | POA: Diagnosis not present

## 2020-06-06 ENCOUNTER — Encounter: Payer: Self-pay | Admitting: Family Medicine

## 2020-06-06 ENCOUNTER — Ambulatory Visit (INDEPENDENT_AMBULATORY_CARE_PROVIDER_SITE_OTHER): Payer: PPO | Admitting: Family Medicine

## 2020-06-06 ENCOUNTER — Encounter: Payer: Self-pay | Admitting: *Deleted

## 2020-06-06 ENCOUNTER — Ambulatory Visit: Payer: PPO | Admitting: Family Medicine

## 2020-06-06 ENCOUNTER — Other Ambulatory Visit: Payer: Self-pay

## 2020-06-06 ENCOUNTER — Encounter (HOSPITAL_COMMUNITY): Payer: Self-pay | Admitting: Emergency Medicine

## 2020-06-06 ENCOUNTER — Emergency Department (HOSPITAL_COMMUNITY): Payer: PPO

## 2020-06-06 ENCOUNTER — Encounter (HOSPITAL_COMMUNITY): Payer: Self-pay | Admitting: Internal Medicine

## 2020-06-06 ENCOUNTER — Observation Stay (HOSPITAL_COMMUNITY)
Admission: EM | Admit: 2020-06-06 | Discharge: 2020-06-07 | Disposition: A | Discharge status: Home / Self Care | Payer: PPO | Attending: Internal Medicine | Admitting: Internal Medicine

## 2020-06-06 VITALS — BP 157/86 | HR 67 | Temp 97.6°F | Ht 67.0 in | Wt 269.2 lb

## 2020-06-06 DIAGNOSIS — J45909 Unspecified asthma, uncomplicated: Secondary | ICD-10-CM | POA: Diagnosis not present

## 2020-06-06 DIAGNOSIS — Z79899 Other long term (current) drug therapy: Secondary | ICD-10-CM | POA: Insufficient documentation

## 2020-06-06 DIAGNOSIS — Z87891 Personal history of nicotine dependence: Secondary | ICD-10-CM | POA: Diagnosis not present

## 2020-06-06 DIAGNOSIS — J449 Chronic obstructive pulmonary disease, unspecified: Secondary | ICD-10-CM | POA: Diagnosis not present

## 2020-06-06 DIAGNOSIS — R079 Chest pain, unspecified: Secondary | ICD-10-CM | POA: Diagnosis present

## 2020-06-06 DIAGNOSIS — I5032 Chronic diastolic (congestive) heart failure: Secondary | ICD-10-CM | POA: Insufficient documentation

## 2020-06-06 DIAGNOSIS — R0789 Other chest pain: Secondary | ICD-10-CM

## 2020-06-06 DIAGNOSIS — I130 Hypertensive heart and chronic kidney disease with heart failure and stage 1 through stage 4 chronic kidney disease, or unspecified chronic kidney disease: Secondary | ICD-10-CM | POA: Insufficient documentation

## 2020-06-06 DIAGNOSIS — I509 Heart failure, unspecified: Secondary | ICD-10-CM

## 2020-06-06 DIAGNOSIS — I2510 Atherosclerotic heart disease of native coronary artery without angina pectoris: Secondary | ICD-10-CM | POA: Diagnosis present

## 2020-06-06 DIAGNOSIS — I25110 Atherosclerotic heart disease of native coronary artery with unstable angina pectoris: Principal | ICD-10-CM | POA: Insufficient documentation

## 2020-06-06 DIAGNOSIS — N183 Chronic kidney disease, stage 3 unspecified: Secondary | ICD-10-CM | POA: Insufficient documentation

## 2020-06-06 DIAGNOSIS — E1122 Type 2 diabetes mellitus with diabetic chronic kidney disease: Secondary | ICD-10-CM | POA: Insufficient documentation

## 2020-06-06 DIAGNOSIS — R0602 Shortness of breath: Secondary | ICD-10-CM | POA: Diagnosis not present

## 2020-06-06 LAB — COMPREHENSIVE METABOLIC PANEL
ALT: 24 U/L (ref 0–44)
AST: 20 U/L (ref 15–41)
Albumin: 3.9 g/dL (ref 3.5–5.0)
Alkaline Phosphatase: 73 U/L (ref 38–126)
Anion gap: 9 (ref 5–15)
BUN: 22 mg/dL (ref 8–23)
CO2: 27 mmol/L (ref 22–32)
Calcium: 8.7 mg/dL — ABNORMAL LOW (ref 8.9–10.3)
Chloride: 102 mmol/L (ref 98–111)
Creatinine, Ser: 1.04 mg/dL (ref 0.61–1.24)
GFR, Estimated: >60 mL/min (ref >60)
Glucose, Bld: 128 mg/dL — ABNORMAL HIGH (ref 70–99)
Potassium: 4.3 mmol/L (ref 3.5–5.1)
Sodium: 138 mmol/L (ref 135–145)
Total Bilirubin: 0.9 mg/dL (ref 0.3–1.2)
Total Protein: 6.6 g/dL (ref 6.5–8.1)

## 2020-06-06 LAB — CBC
HCT: 46.1 % (ref 39.0–52.0)
Hemoglobin: 15 g/dL (ref 13.0–17.0)
MCH: 29 pg (ref 26.0–34.0)
MCHC: 32.5 g/dL (ref 30.0–36.0)
MCV: 89.2 fL (ref 80.0–100.0)
Platelets: 169 10*3/uL (ref 150–400)
RBC: 5.17 MIL/uL (ref 4.22–5.81)
RDW: 15.6 % — ABNORMAL HIGH (ref 11.5–15.5)
WBC: 7.3 10*3/uL (ref 4.0–10.5)
nRBC: 0 % (ref 0.0–0.2)

## 2020-06-06 LAB — TROPONIN I (HIGH SENSITIVITY)
Troponin I (High Sensitivity): 5 ng/L (ref <18)
Troponin I (High Sensitivity): 5 ng/L (ref <18)

## 2020-06-06 LAB — BRAIN NATRIURETIC PEPTIDE: B Natriuretic Peptide: 279 pg/mL — ABNORMAL HIGH (ref 0.0–100.0)

## 2020-06-06 MED ORDER — DAPAGLIFLOZIN PROPANEDIOL 5 MG PO TABS
5.0000 mg | ORAL_TABLET | Freq: Every day | ORAL | Status: DC
  Administered 2020-06-07: 5 mg via ORAL
  Filled 2020-06-06 (×3): qty 1

## 2020-06-06 MED ORDER — FAMOTIDINE 20 MG PO TABS
20.0000 mg | ORAL_TABLET | Freq: Every day | ORAL | Status: DC
  Administered 2020-06-06: 20 mg via ORAL
  Filled 2020-06-06: qty 1

## 2020-06-06 MED ORDER — COLCHICINE 0.6 MG PO TABS: 0.6000 mg | ORAL_TABLET | Freq: Every day | ORAL | Status: DC | PRN

## 2020-06-06 MED ORDER — UMECLIDINIUM BROMIDE 62.5 MCG/INH IN AEPB
1.0000 | INHALATION_SPRAY | Freq: Every day | RESPIRATORY_TRACT | Status: DC
  Administered 2020-06-07: 1 via RESPIRATORY_TRACT
  Filled 2020-06-06: qty 7

## 2020-06-06 MED ORDER — CLONIDINE HCL 0.2 MG PO TABS
0.2000 mg | ORAL_TABLET | Freq: Three times a day (TID) | ORAL | Status: DC
  Administered 2020-06-06 – 2020-06-07 (×2): 0.2 mg via ORAL
  Filled 2020-06-06 (×2): qty 1

## 2020-06-06 MED ORDER — ACETAMINOPHEN 325 MG PO TABS: 650.0000 mg | ORAL_TABLET | ORAL | Status: DC | PRN

## 2020-06-06 MED ORDER — PANTOPRAZOLE SODIUM 40 MG PO TBEC
40.0000 mg | DELAYED_RELEASE_TABLET | Freq: Every day | ORAL | Status: DC
  Administered 2020-06-07: 40 mg via ORAL
  Filled 2020-06-06: qty 1

## 2020-06-06 MED ORDER — SERTRALINE HCL 50 MG PO TABS
100.0000 mg | ORAL_TABLET | Freq: Every day | ORAL | Status: DC
  Administered 2020-06-07: 100 mg via ORAL
  Filled 2020-06-06: qty 2

## 2020-06-06 MED ORDER — MIRABEGRON ER 25 MG PO TB24
25.0000 mg | ORAL_TABLET | Freq: Every day | ORAL | Status: DC
  Administered 2020-06-07: 25 mg via ORAL
  Filled 2020-06-06: qty 1

## 2020-06-06 MED ORDER — TAMSULOSIN HCL 0.4 MG PO CAPS: 0.4000 mg | ORAL_CAPSULE | Freq: Every day | ORAL | Status: DC

## 2020-06-06 MED ORDER — ENOXAPARIN SODIUM 40 MG/0.4ML IJ SOSY
40.0000 mg | PREFILLED_SYRINGE | INTRAMUSCULAR | Status: DC
  Administered 2020-06-06: 40 mg via SUBCUTANEOUS
  Filled 2020-06-06: qty 0.4

## 2020-06-06 MED ORDER — ONDANSETRON HCL 4 MG/2ML IJ SOLN: 4.0000 mg | Freq: Four times a day (QID) | INTRAMUSCULAR | Status: DC | PRN

## 2020-06-06 MED ORDER — MOMETASONE FURO-FORMOTEROL FUM 100-5 MCG/ACT IN AERO
2.0000 | INHALATION_SPRAY | Freq: Two times a day (BID) | RESPIRATORY_TRACT | Status: DC
  Administered 2020-06-06 – 2020-06-07 (×2): 2 via RESPIRATORY_TRACT
  Filled 2020-06-06: qty 8.8

## 2020-06-06 MED ORDER — CYCLOSPORINE 0.05 % OP EMUL
1.0000 [drp] | Freq: Two times a day (BID) | OPHTHALMIC | Status: DC
  Administered 2020-06-06 – 2020-06-07 (×2): 1 [drp] via OPHTHALMIC
  Filled 2020-06-06 (×2): qty 1

## 2020-06-06 MED ORDER — FERROUS SULFATE 325 (65 FE) MG PO TABS
325.0000 mg | ORAL_TABLET | Freq: Three times a day (TID) | ORAL | Status: DC
  Administered 2020-06-06 – 2020-06-07 (×2): 325 mg via ORAL
  Filled 2020-06-06 (×2): qty 1

## 2020-06-06 MED ORDER — POLYETHYLENE GLYCOL 3350 17 G PO PACK: 17.0000 g | PACK | Freq: Every day | ORAL | Status: DC | PRN

## 2020-06-06 MED ORDER — ALBUTEROL SULFATE (2.5 MG/3ML) 0.083% IN NEBU: 1.2500 mg | INHALATION_SOLUTION | Freq: Four times a day (QID) | RESPIRATORY_TRACT | Status: DC | PRN

## 2020-06-06 MED ORDER — ATORVASTATIN CALCIUM 40 MG PO TABS
40.0000 mg | ORAL_TABLET | Freq: Every day | ORAL | Status: DC
  Administered 2020-06-07: 40 mg via ORAL
  Filled 2020-06-06: qty 1

## 2020-06-06 MED ORDER — HYDRALAZINE HCL 25 MG PO TABS
50.0000 mg | ORAL_TABLET | Freq: Three times a day (TID) | ORAL | Status: DC
  Administered 2020-06-06 – 2020-06-07 (×2): 50 mg via ORAL
  Filled 2020-06-06 (×2): qty 2

## 2020-06-06 MED ORDER — NITROGLYCERIN 0.4 MG SL SUBL
0.4000 mg | SUBLINGUAL_TABLET | SUBLINGUAL | Status: DC | PRN
  Administered 2020-06-06: 0.4 mg via SUBLINGUAL
  Filled 2020-06-06: qty 1

## 2020-06-06 MED ORDER — BISOPROLOL FUMARATE 5 MG PO TABS
2.5000 mg | ORAL_TABLET | Freq: Every day | ORAL | Status: DC
  Administered 2020-06-07: 2.5 mg via ORAL
  Filled 2020-06-06: qty 1

## 2020-06-06 MED ORDER — BUDESON-GLYCOPYRROL-FORMOTEROL 160-9-4.8 MCG/ACT IN AERO: 2.0000 | INHALATION_SPRAY | Freq: Two times a day (BID) | RESPIRATORY_TRACT | Status: DC

## 2020-06-06 MED ORDER — ISOSORBIDE DINITRATE 20 MG PO TABS
20.0000 mg | ORAL_TABLET | Freq: Three times a day (TID) | ORAL | Status: DC
  Administered 2020-06-06 – 2020-06-07 (×2): 20 mg via ORAL
  Filled 2020-06-06 (×2): qty 1

## 2020-06-06 MED ORDER — ASPIRIN 81 MG PO CHEW
324.0000 mg | CHEWABLE_TABLET | Freq: Once | ORAL | Status: AC
  Administered 2020-06-06: 324 mg via ORAL
  Filled 2020-06-06: qty 4

## 2020-06-06 NOTE — H&P (Signed)
TRH H&P   Patient Demographics:    Kenneth Fuller, is a 75 y.o. male  MRN: [id number]   DOB - [birth year]  Admit Date - 06/06/2020  Outpatient Primary MD for the patient is [name], [name], MD  Referring MD/NP/PA: Dellie Catholic  Outpatient Specialists: Dr. Kathlene Cote  Patient coming from: Home  Chief Complaint  Patient presents with  . Chest Pain      HPI:    Kenneth Fuller  is a 75 y.o. male, of chronic diastolic CHF, obesity, OSA, hyperlipidemia, diabetes mellitus, neuropathy, minor CAD, moderately severe restrictive lung disease, most recent cardiac cath in October 2018 showed a maximal stenosis of 10% in the mid LAD, patient presents to ED secondary to complaints of chest pain, reports chest pain has been going on for few days, at activity, and at rest, reports he felt it was worse today which prompted him to come to ED, he does report some mild dyspnea with activity as well, this pain is sharp sensation, and left chest, with some radiation to the left side/arm as well, he does report some dyspnea feels like COPD, denies any cough, fever or chills. -In ED his troponin is negative x2, EKG nonacute, chest pain improved with nitro, no significant lab abnormalities, [facility] hospitalist consulted to admit.    Review of systems:    In addition to the HPI above,  No Fever-chills, No Headache, No changes with Vision or hearing, No problems swallowing food or Liquids, Reports chest pain, and shortness of breath, no cough. No Abdominal pain, No Nausea or Vommitting, Bowel movements are regular, No Blood in stool or Urine, No dysuria, No new skin rashes or bruises, No new joints pains-aches,  No new weakness, tingling, numbness in any extremity, No recent weight gain or loss, No polyuria, polydypsia or polyphagia, No significant Mental Stressors.  A full 10 point Review of  Systems was done, except as stated above, all other Review of Systems were negative.   With Past History of the following :    Past Medical History:  Diagnosis Date  . Allergy   . Asthma   . CAD (coronary artery disease)   . CHF (congestive heart failure) ([place])   . Diabetes mellitus   . Fibromyalgia   . GERD (gastroesophageal reflux disease)   . GI bleeding   . Gout   . Hyperlipidemia   . Hypertension   . Hypogonadism male   . Insomnia   . MRSA cellulitis   . Neuropathy   . Obesity   . Shortness of breath dyspnea    with exertion   . Sleep apnea    cpap- 14   . Wheezing    no asthma diagnosis      Past Surgical History:  Procedure Laterality Date  . BACK SURGERY    . BIOPSY  12/20/2019   Procedure: BIOPSY;  Surgeon: Milus Banister,  MD;  Location: [facility];  Service: Endoscopy;;  . CARDIAC CATHETERIZATION    . COLONOSCOPY    . COLONOSCOPY WITH PROPOFOL N/A 12/20/2019   Procedure: COLONOSCOPY WITH PROPOFOL;  Surgeon: Milus Banister, MD;  Location: [facility];  Service: Endoscopy;  Laterality: N/A;  . ESOPHAGOGASTRODUODENOSCOPY (EGD) WITH PROPOFOL N/A 12/20/2019   Procedure: ESOPHAGOGASTRODUODENOSCOPY (EGD) WITH PROPOFOL;  Surgeon: Milus Banister, MD;  Location: [facility];  Service: Endoscopy;  Laterality: N/A;  . LEFT HEART CATH AND CORONARY ANGIOGRAPHY N/A 12/02/2016   Procedure: LEFT HEART CATH AND CORONARY ANGIOGRAPHY;  Surgeon: Jettie Booze, MD;  Location: [facility];  Service: Cardiovascular;  Laterality: N/A;  . LUMBAR LAMINECTOMY/DECOMPRESSION MICRODISCECTOMY N/A 01/02/2014   Procedure: CENTRAL DECOMPRESSION LUMBAR LAMINECTOMY L3-L4, L4-L5;  Surgeon: Tobi Bastos, MD;  Location: [facility];  Service: Orthopedics;  Laterality: N/A;  . neck fusion    . POLYPECTOMY  12/20/2019   Procedure: POLYPECTOMY;  Surgeon: Milus Banister, MD;  Location: [facility];  Service: Endoscopy;;      Social History:     Social History    Tobacco Use  . Smoking status: Former Smoker    Quit date: 02/08/1969    Years since quitting: 51.3  . Smokeless tobacco: Former Systems developer    Quit date: 1971  Substance Use Topics  . Alcohol use: No    Alcohol/week: 0.0 standard drinks       Family History :     Family History  Problem Relation Age of Onset  . Colon cancer Mother   . Diabetes Father        siblings  . Heart disease Father        brother  . Heart attack Father   . Kidney disease Sister   . Heart failure Sister   . Heart disease Brother   . Heart attack Son 70  . Drug abuse Sister   . Heart disease Brother   . Deep vein thrombosis Brother   . Colon polyps Neg Hx      Home Medications:   Prior to Admission medications   Medication Sig Start Date End Date Taking? Authorizing Provider  albuterol (ACCUNEB) 1.25 MG/3ML nebulizer solution Take 1 ampule by nebulization every 6 (six) hours as needed for wheezing.   Yes [provider]  albuterol (VENTOLIN HFA) 108 (90 Base) MCG/ACT inhaler Inhale 2 puffs into the lungs every 6 (six) hours as needed for wheezing or shortness of breath. 05/05/20  Yes Stacks, Cletus Gash, MD  atorvastatin (LIPITOR) 40 MG tablet Take 40 mg by mouth daily. 1 Tablet Daily   Yes [provider]  bisoprolol (ZEBETA) 5 MG tablet Take 1/2 (one-half) tablet by mouth once daily Patient taking differently: Take 2.5 mg by mouth daily. 06/04/20  Yes [name], [name], MD  Budeson-Glycopyrrol-Formoterol (BREZTRI AEROSPHERE) 160-9-4.8 MCG/ACT AERO Inhale 2 puffs into the lungs in the morning and at bedtime. 01/23/20  Yes [name], [name], MD  Cholecalciferol (VITAMIN D3) 125 MCG (5000 UT) TABS Take 5,000 Units by mouth daily.   Yes [provider]  cloNIDine (CATAPRES) 0.2 MG tablet Take 1 tablet (0.2 mg total) by mouth 3 (three) times daily. (NEEDS TO BE SEEN BEFORE NEXT REFILL) 05/14/20  Yes [name], [name], MD  colchicine 0.6 MG tablet Take 0.6 mg by mouth daily. As  needed   Yes [provider]  dapagliflozin propanediol (FARXIGA) 5 MG TABS tablet Take 1 tablet (5 mg total) by mouth daily. 01/02/20  Yes [name], [name], MD  famotidine (PEPCID) 20 MG tablet Take 1 tablet (20 mg total) by mouth at bedtime. 05/07/20  Yes Martyn Ehrich, NP  FEROSUL 325 (65 Fe) MG tablet Take 325 mg by mouth 3 (three) times daily.  08/15/19  Yes [provider]  furosemide (LASIX) 40 MG tablet Take 40 mg by mouth daily as needed (fluid retention.).   Yes [provider]  hydrALAZINE (APRESOLINE) 50 MG tablet Take 1 tablet (50 mg total) by mouth 3 (three) times daily. 03/14/20  Yes [name], [name], MD  isosorbide dinitrate (ISORDIL) 10 MG tablet TAKE 1 TABLET BY MOUTH THREE TIMES DAILY 01/11/20  Yes [name], [name], MD  mirabegron ER (MYRBETRIQ) 25 MG TB24 tablet Take 1 tablet (25 mg total) by mouth daily. 12/07/19  Yes [name], [name], MD  omeprazole (PRILOSEC) 40 MG capsule Take 1 capsule (40 mg total) by mouth 2 (two) times daily at 8 am and 10 pm. Take 30-60 minutes prior to breakfast. 05/13/20  Yes Ladene Artist, MD  polyethylene glycol (MIRALAX / GLYCOLAX) 17 g packet Take 17 g by mouth daily as needed for mild constipation.   Yes [provider]  RESTASIS 0.05 % ophthalmic emulsion Place 1 drop into both eyes in the morning and at bedtime. 12/07/19  Yes [provider]  sertraline (ZOLOFT) 100 MG tablet Take 1 tablet (100 mg total) by mouth daily. (NEEDS TO BE SEEN BEFORE NEXT REFILL) 05/29/20  Yes [name], [name], MD  tamsulosin (FLOMAX) 0.4 MG CAPS capsule Take 1 capsule (0.4 mg total) by mouth daily after supper. 11/22/19  Yes [name], [name], MD  azelastine (OPTIVAR) 0.05 % ophthalmic solution Place 1 drop into both eyes in the morning and at bedtime. Patient not taking: Reported on 06/06/2020 12/07/19   [provider]  Ferrous Sulfate Dried (HIGH POTENCY IRON) 65 MG TABS TAKE 1 TABLET BY MOUTH  THREE TIMES DAILY WITH MEALS. DO NOT CRUSH, CHEW OR SPIT Patient not taking: Reported on 06/06/2020 05/14/20   [provider]  gabapentin (NEURONTIN) 100 MG capsule Take 1 capsule (100 mg total) by mouth 3 (three) times daily. Patient not taking: No sig reported 05/07/20   Martyn Ehrich, NP  prednisoLONE acetate (PRED FORTE) 1 % ophthalmic suspension Place 1 drop into both eyes in the morning and at bedtime. Patient not taking: Reported on 06/06/2020 12/07/19   [provider]     Allergies:     Allergies  Allergen Reactions  . Amlodipine Besy-Benazepril Hcl Swelling and Other (See Comments)    Makes tongue swell (lotrel)  . Oxycodone Itching  . Phenergan [Promethazine Hcl] Other (See Comments)    "I can't remember."  . Hydrocodone Itching    Can tolerate in low doses     Physical Exam:   Vitals  Blood pressure (!) 166/97, pulse 66, temperature 97.8 F (36.6 C), temperature source Oral, resp. rate 20, height _0  (1.702 m), weight 117.9 kg, SpO2 97 %.   1. General developed male, laying in bed, no apparent distress  2. Normal affect and insight, Not Suicidal or Homicidal, Awake Alert, Oriented X 3.  3. No F.N deficits, ALL C.Nerves Intact, Strength 5/5 all 4 extremities, Sensation intact all 4 extremities, Plantars down going.  4. Ears and Eyes appear Normal, Conjunctivae clear, PERRLA. Moist Oral Mucosa.  5. Supple Neck, No JVD, No cervical lymphadenopathy appriciated, No Carotid Bruits.  6. Symmetrical Chest wall movement, Good air movement  bilaterally, CTAB.  7. RRR, No Gallops, Rubs or Murmurs, No Parasternal Heave.  Has reproducible chest pain on palpation  8. Positive Bowel Sounds, Abdomen Soft, No tenderness, No organomegaly appriciated,No rebound -guarding or rigidity.  9.  No Cyanosis, Normal Skin Turgor, No Skin Rash or Bruise.  10. Good muscle tone,  joints appear normal , no effusions, Normal ROM.  11. No Palpable Lymph Nodes in Neck  or Axillae    Data Review:    CBC Recent Labs  Lab 06/06/20 1500  WBC 7.3  HGB 15.0  HCT 46.1  PLT 169  MCV 89.2  MCH 29.0  MCHC 32.5  RDW 15.6*   ------------------------------------------------------------------------------------------------------------------  Chemistries  Recent Labs  Lab 06/06/20 1500  NA 138  K 4.3  CL 102  CO2 27  GLUCOSE 128*  BUN 22  CREATININE 1.04  CALCIUM 8.7*  AST 20  ALT 24  ALKPHOS 73  BILITOT 0.9   ------------------------------------------------------------------------------------------------------------------ estimated creatinine clearance is 76.5 mL/min (by C-G formula based on SCr of 1.04 mg/dL). ------------------------------------------------------------------------------------------------------------------ No results for input(s): TSH, T4TOTAL, T3FREE, THYROIDAB in the last 72 hours.  Invalid input(s): FREET3  Coagulation profile No results for input(s): INR, PROTIME in the last 168 hours. ------------------------------------------------------------------------------------------------------------------- No results for input(s): DDIMER in the last 72 hours. -------------------------------------------------------------------------------------------------------------------  Cardiac Enzymes No results for input(s): CKMB, TROPONINI, MYOGLOBIN in the last 168 hours.  Invalid input(s): CK ------------------------------------------------------------------------------------------------------------------    Component Value Date/Time   BNP 279.0 (H) 06/06/2020 1500     ---------------------------------------------------------------------------------------------------------------  Urinalysis    Component Value Date/Time   COLORURINE YELLOW 06/04/2019 0155   APPEARANCEUR Clear 11/20/2019 1509   LABSPEC 1.015 06/04/2019 0155   PHURINE 5.0 06/04/2019 0155   GLUCOSEU 3+ (A) 11/20/2019 1509   HGBUR NEGATIVE 06/04/2019 0155    BILIRUBINUR Negative 11/20/2019 1509   KETONESUR NEGATIVE 06/04/2019 0155   PROTEINUR 1+ (A) 11/20/2019 1509   PROTEINUR NEGATIVE 06/04/2019 0155   UROBILINOGEN 0.2 12/26/2013 1135   NITRITE Negative 11/20/2019 1509   NITRITE NEGATIVE 06/04/2019 0155   LEUKOCYTESUR Negative 11/20/2019 1509   LEUKOCYTESUR NEGATIVE 06/04/2019 0155    ----------------------------------------------------------------------------------------------------------------   Imaging Results:    DG Chest 2 View  Result Date: 06/06/2020 CLINICAL DATA:  Chest pain and shortness of breath for 1 day EXAM: CHEST - 2 VIEW COMPARISON:  05/19/2020 FINDINGS: The heart size and mediastinal contours are within normal limits. Both lungs are clear. The visualized skeletal structures are unremarkable. IMPRESSION: No active cardiopulmonary disease. Electronically Signed   By: Randa Ngo M.D.   On: 06/06/2020 15:13    My personal review of EKG: Rhythm NSR, Rate  67 /min, QTc464   Assessment & Plan:    Active Problems:   Coronary artery disease involving native coronary artery of native heart without angina pectoris   Chronic diastolic heart failure (HCC)   CKD (chronic kidney disease), stage III (HCC)   Chronic obstructive pulmonary disease (HCC)   CHF (congestive heart failure) (HCC)   Chest pain   Chest pain -Has mixed features, sounds typical as it is resolved with nitro, but not typical as it is reproducible by palpation . -ACS ruled out, repeated troponins 5> 5 as well his EKG is reassuring -He is currently chest pain-free, as I mentioned earlier it appears to be musculoskeletal. -Given his history ,he will be observed overnight, repeat EKG and troponins in a.m., he can be discharged home as long they are stable -Cardiac cath in 2018 showing mild CAD, which is  reassuring -Repeat 2D echo - dyspnea most likely related to his COPD.  Hyperlipidemia -Continue with statin  COPD - No wheezing, continue with home  medications  Hypertension -Blood pressure is acceptable, continue with home medications  History of CAD -Please see above discussion under chest pain, continue with statin, beta-blockers, I will increase his Isordil from 10 to 20 mg oral 3 times daily  BPH -Continue with Flomax  GERD -Continue with PPI and famotidine   Diabetes mellitus - Continue with fargxiga will add insulin sliding scale as well  Chronic diastolic CHF -Appears euvolemic  DVT Prophylaxis   Lovenox   AM Labs Ordered, also please review Full Orders  Family Communication: Admission, patients condition and plan of care including tests being ordered have been discussed with the patient  who indicate understanding and agree with the plan and Code Status.  Code Status Full  Likely DC to  home  Condition GUARDED   Consults called: none    Admission status: Observation    Time spent in minutes : 55 minutes   Phillips Climes M.D on 06/06/2020 at 9:39 PM   [facility] - Office  [phone]

## 2020-06-06 NOTE — Progress Notes (Signed)
Acute Office Visit  Subjective:    Patient ID: Kenneth Fuller, male    DOB: [birth year], 75 y.o.   MRN: [id number]  Chief Complaint  Patient presents with  . Fatigue    HPI Kenneth Fuller is really with his daughter today. Patient is in today for fatigue x 2 days. He also felt dizzy and lightheaded intermittently. He reports his heart rate was erratic yesterday ranging between 40-70s. He also reports intemittent left sided chest pain with pain that radiates to his left upper arm. The pain is like a pressure. Reports this an been intermittent for the last 2 days and last for a few minutes at a time. He also reports shortness of breath with exertion. He states that he really just doesn't feel well and feels really week. History significant for CAD, CHF, HTN, and DM.   Past Medical History:  Diagnosis Date  . Allergy   . Asthma   . CAD (coronary artery disease)   . CHF (congestive heart failure) ([place])   . Diabetes mellitus   . Fibromyalgia   . GERD (gastroesophageal reflux disease)   . GI bleeding   . Gout   . Hyperlipidemia   . Hypertension   . Hypogonadism male   . Insomnia   . MRSA cellulitis   . Neuropathy   . Obesity   . Shortness of breath dyspnea    with exertion   . Sleep apnea    cpap- 14   . Wheezing    no asthma diagnosis    Past Surgical History:  Procedure Laterality Date  . BACK SURGERY    . BIOPSY  12/20/2019   Procedure: BIOPSY;  Surgeon: Milus Banister, MD;  Location: [facility];  Service: Endoscopy;;  . CARDIAC CATHETERIZATION    . COLONOSCOPY    . COLONOSCOPY WITH PROPOFOL N/A 12/20/2019   Procedure: COLONOSCOPY WITH PROPOFOL;  Surgeon: Milus Banister, MD;  Location: [facility];  Service: Endoscopy;  Laterality: N/A;  . ESOPHAGOGASTRODUODENOSCOPY (EGD) WITH PROPOFOL N/A 12/20/2019   Procedure: ESOPHAGOGASTRODUODENOSCOPY (EGD) WITH PROPOFOL;  Surgeon: Milus Banister, MD;  Location: [facility];  Service: Endoscopy;  Laterality: N/A;  . LEFT  HEART CATH AND CORONARY ANGIOGRAPHY N/A 12/02/2016   Procedure: LEFT HEART CATH AND CORONARY ANGIOGRAPHY;  Surgeon: Jettie Booze, MD;  Location: [facility];  Service: Cardiovascular;  Laterality: N/A;  . LUMBAR LAMINECTOMY/DECOMPRESSION MICRODISCECTOMY N/A 01/02/2014   Procedure: CENTRAL DECOMPRESSION LUMBAR LAMINECTOMY L3-L4, L4-L5;  Surgeon: Tobi Bastos, MD;  Location: [facility];  Service: Orthopedics;  Laterality: N/A;  . neck fusion    . POLYPECTOMY  12/20/2019   Procedure: POLYPECTOMY;  Surgeon: Milus Banister, MD;  Location: [facility];  Service: Endoscopy;;    Family History  Problem Relation Age of Onset  . Colon cancer Mother   . Diabetes Father        siblings  . Heart disease Father        brother  . Heart attack Father   . Kidney disease Sister   . Heart failure Sister   . Heart disease Brother   . Heart attack Son 24  . Drug abuse Sister   . Heart disease Brother   . Deep vein thrombosis Brother   . Colon polyps Neg Hx     Social History   Socioeconomic History  . Marital status: Married    Spouse name: Butch Penny  . Number of children: 3  . Years of education: 8  .  Highest education level: GED or equivalent  Occupational History  . 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 Smoking status: Former Smoker    Quit date: 02/08/1969    Years since quitting: 51.3  . Smokeless tobacco: Former Systems developer    Quit date: Pharmacologist  . Vaping Use: Never used  Substance and Sexual Activity  . Alcohol use: No    Alcohol/week: 0.0 standard drinks  . Drug use: No  . Sexual activity: Not Currently  Other Topics Concern  . Not on file  Social History Narrative   Drinks caffeine tea occasionally    Social Determinants of Health   Financial Resource Strain: Low Risk   . Difficulty of Paying Living Expenses: Not hard at all  Food Insecurity: No Food Insecurity  . Worried About Charity fundraiser  in the Last Year: Never true  . Ran Out of Food in the Last Year: Never true  Transportation Needs: No Transportation Needs  . Lack of Transportation (Medical): No  . Lack of Transportation (Non-Medical): No  Physical Activity: Inactive  . Days of Exercise per Week: 0 days  . Minutes of Exercise per Session: 0 min  Stress: No Stress Concern Present  . Feeling of Stress : Not at all  Social Connections: Socially Integrated  . Frequency of Communication with Friends and Family: More than three times a week  . Frequency of Social Gatherings with Friends and Family: More than three times a week  . Attends Religious Services: More than 4 times per year  . Active Member of Clubs or Organizations: Yes  . Attends Archivist Meetings: More than 4 times per year  . Marital Status: Married  Human resources officer Violence: Not At Risk  . Fear of Current or Ex-Partner: No  . Emotionally Abused: No  . Physically Abused: No  . Sexually Abused: No    Outpatient Medications Prior to Visit  Medication Sig Dispense Refill  . albuterol (ACCUNEB) 1.25 MG/3ML nebulizer solution Take 1 ampule by nebulization every 6 (six) hours as needed for wheezing.    [name] albuterol (VENTOLIN HFA) 108 (90 Base) MCG/ACT inhaler Inhale 2 puffs into the lungs every 6 (six) hours as needed for wheezing or shortness of breath. 1 each 2  . atorvastatin (LIPITOR) 40 MG tablet Take 40 mg by mouth daily. 1 Tablet Daily    . azelastine (OPTIVAR) 0.05 % ophthalmic solution Place 1 drop into both eyes in the morning and at bedtime.    . bisoprolol (ZEBETA) 5 MG tablet Take 1/2 (one-half) tablet by mouth once daily 45 tablet 1  . Budeson-Glycopyrrol-Formoterol (BREZTRI AEROSPHERE) 160-9-4.8 MCG/ACT AERO Inhale 2 puffs into the lungs in the morning and at bedtime. 42.8 g 4  . Cholecalciferol (VITAMIN D3) 125 MCG (5000 UT) TABS Take 5,000 Units by mouth daily.    . cloNIDine (CATAPRES) 0.2 MG tablet Take 1 tablet (0.2 mg total) by  mouth 3 (three) times daily. (NEEDS TO BE SEEN BEFORE NEXT REFILL) 90 tablet 0  . colchicine 0.6 MG tablet Take 0.6 mg by mouth daily. As needed    . dapagliflozin propanediol (FARXIGA) 5 MG TABS tablet Take 1 tablet (5 mg total) by mouth daily. 90 tablet 3  . famotidine (PEPCID) 20 MG tablet Take 1 tablet (20 mg total) by mouth at bedtime. 30 tablet 3  . FEROSUL 325 (65 Fe) MG tablet Take 325 mg by mouth  3 (three) times daily.     . furosemide (LASIX) 40 MG tablet Take 40 mg by mouth daily as needed (fluid retention.).    [name] gabapentin (NEURONTIN) 100 MG capsule Take 1 capsule (100 mg total) by mouth 3 (three) times daily. 90 capsule 1  . hydrALAZINE (APRESOLINE) 50 MG tablet Take 1 tablet (50 mg total) by mouth 3 (three) times daily. 90 tablet 3  . isosorbide dinitrate (ISORDIL) 10 MG tablet TAKE 1 TABLET BY MOUTH THREE TIMES DAILY 90 tablet 5  . mirabegron ER (MYRBETRIQ) 25 MG TB24 tablet Take 1 tablet (25 mg total) by mouth daily. 30 tablet 3  . omeprazole (PRILOSEC) 40 MG capsule Take 1 capsule (40 mg total) by mouth 2 (two) times daily at 8 am and 10 pm. Take 30-60 minutes prior to breakfast. 60 capsule 5  . polyethylene glycol (MIRALAX / GLYCOLAX) 17 g packet Take 17 g by mouth daily as needed for mild constipation.    . RESTASIS 0.05 % ophthalmic emulsion Place 1 drop into both eyes in the morning and at bedtime.    . sertraline (ZOLOFT) 100 MG tablet Take 1 tablet (100 mg total) by mouth daily. (NEEDS TO BE SEEN BEFORE NEXT REFILL) 30 tablet 0  . tamsulosin (FLOMAX) 0.4 MG CAPS capsule Take 1 capsule (0.4 mg total) by mouth daily after supper. 90 capsule 3  . prednisoLONE acetate (PRED FORTE) 1 % ophthalmic suspension Place 1 drop into both eyes in the morning and at bedtime. (Patient not taking: Reported on 06/06/2020)     No facility-administered medications prior to visit.    Allergies  Allergen Reactions  . Amlodipine Besy-Benazepril Hcl Swelling and Other (See Comments)    Makes  tongue swell (lotrel)  . Oxycodone Itching  . Phenergan [Promethazine Hcl] Other (See Comments)    "I can't remember."  . Hydrocodone Itching    Can tolerate in low doses    Review of Systems As per HPI.     Objective:    Physical Exam Vitals and nursing note reviewed.  Constitutional:      General: He is not in acute distress.    Appearance: He is not diaphoretic.  Cardiovascular:     Rate and Rhythm: Normal rate and regular rhythm.     Heart sounds: Normal heart sounds. No murmur heard.   Pulmonary:     Effort: Pulmonary effort is normal. No respiratory distress.     Breath sounds: Normal breath sounds.  Skin:    General: Skin is warm and dry.  Neurological:     General: No focal deficit present.     Mental Status: He is alert and oriented to person, place, and time.  Psychiatric:        Mood and Affect: Mood normal.        Behavior: Behavior normal.     BP (!) 157/86   Pulse 67   Temp 97.6 F (36.4 C) (Temporal)   Ht _0  (1.702 m)   Wt 269 lb 4 oz (122.1 kg)   SpO2 96%   BMI 42.17 kg/m  Wt Readings from Last 3 Encounters:  06/06/20 269 lb 4 oz (122.1 kg)  05/22/20 266 lb 9.6 oz (120.9 kg)  05/19/20 263 lb (119.3 kg)    Health Maintenance Due  Topic Date Due  . OPHTHALMOLOGY EXAM  05/22/2020    There are no preventive care reminders to display for this patient.   Lab Results  Component Value Date  TSH 3.30 02/25/2017   Lab Results  Component Value Date   WBC 5.6 05/05/2020   HGB 16.7 05/05/2020   HCT 50.4 05/05/2020   MCV 86 05/05/2020   PLT 181 05/05/2020   Lab Results  Component Value Date   NA 141 05/05/2020   K 5.1 05/05/2020   CO2 21 05/05/2020   GLUCOSE 137 (H) 05/05/2020   BUN 19 05/05/2020   CREATININE 1.17 05/05/2020   BILITOT 0.4 05/05/2020   ALKPHOS 110 05/05/2020   AST 30 05/05/2020   ALT 31 05/05/2020   PROT 7.1 05/05/2020   ALBUMIN 4.5 05/05/2020   CALCIUM 9.0 05/05/2020   ANIONGAP 10 11/01/2019   EGFR 65  05/05/2020   GFR 68.73 02/25/2017   Lab Results  Component Value Date   CHOL 90 (L) 10/04/2019   Lab Results  Component Value Date   HDL 40 10/04/2019   Lab Results  Component Value Date   LDLCALC 26 10/04/2019   Lab Results  Component Value Date   TRIG 142 10/04/2019   Lab Results  Component Value Date   CHOLHDL 2.3 10/04/2019   Lab Results  Component Value Date   HGBA1C 5.8 10/04/2019       Assessment & Plan:   Kimani was seen today for fatigue.  Diagnoses and all orders for this visit:  Chest pain, unspecified type x2 days with radiation to left arm. Also reports dizziness, fatigue, and shortness of breath. EKG today in office today with nonspecific changes from previous EKG in December. Discussed with patient and daughter that given his symptoms he needs to be evaluated in the ED for further work up for chest pain. They agree. Daughter will transport him via their personal vehicle to [facility]. -     EKG 12-Lead  The patient indicates understanding of these issues and agrees with the plan.  Gwenlyn Perking, FNP

## 2020-06-06 NOTE — ED Provider Notes (Signed)
[facility] Provider Note   CSN: [id number] Arrival date & time: 06/06/20  1411     History Chief Complaint  Patient presents with  . Chest Pain    ARNEZ Fuller is a 75 y.o. male with pertinent past medical history of CAD, COPD, CHF, hypertension, diabetes, GERD, hyperlipidemia presents emerged department today for chest pain.  Patient went to his PCP today for fatigue for the last 2 days, also felt slightly dizzy and lightheaded intermittently, told his PCP that his heart rate was erratic yesterday.  He also reports intermittent chest pain for the last 2 days with shortness of breath with exertion.  Patient states that chest pain is a dull sharp sensation primarily on the left side, also radiates down into his arm.  States that it is intermittent, currently having it.  States that shortness of breath is worse with exertion, often has this however has worsened over the last 2 days.  States that he supposed to be on oxygen at night, however over the last month he ran out of oxygen.  Does not need to wear oxygen during the day for COPD.  Patient also admits to orthopnea worsening over the last month.  Denies any leg swelling.  Denies any palpitations.  Patient denies any prior cardiac history, no history of MI or stent.  Denies any fevers or chills, no cough or URI, no heavy lifting.  No regurgitation or abdominal pain.  No nausea or vomiting.  Has never had chest pain like this before.  Does not take any nitro.   Per chart review patient has a cardiac cath done which showed mild nonobstructive coronary atherosclerosis 3 years ago.  Had an echo done a year ago which showed 50 to 55% ejection fraction.   HPI     Past Medical History:  Diagnosis Date  . Allergy   . Asthma   . CAD (coronary artery disease)   . CHF (congestive heart failure) ([place])   . Diabetes mellitus   . Fibromyalgia   . GERD (gastroesophageal reflux disease)   . GI bleeding   . Gout   .  Hyperlipidemia   . Hypertension   . Hypogonadism male   . Insomnia   . MRSA cellulitis   . Neuropathy   . Obesity   . Shortness of breath dyspnea    with exertion   . Sleep apnea    cpap- 14   . Wheezing    no asthma diagnosis    Patient Active Problem List   Diagnosis Date Noted  . Chest pain 06/06/2020  . Right upper lobe pneumonia 05/07/2020  . Irritable larynx 05/07/2020  . AKI (acute kidney injury) ([place]) 06/05/2019  . Hyperkalemia 06/05/2019  . Acute renal failure (ARF) ([place]) 06/04/2019  . Dizziness 06/04/2019  . Near syncope 06/04/2019  . Insomnia   . CHF (congestive heart failure) ([place]) 01/06/2018  . Depression with anxiety 01/04/2018  . CKD (chronic kidney disease), stage III ([place]) 01/04/2018  . Chronic obstructive pulmonary disease ([place]) 01/04/2018  . Depression, recurrent ([place]) 11/11/2017  . Chronic diastolic heart failure ([place]) 04/13/2017  . Swelling of lower extremity 11/30/2016  . Obesity, Class III, BMI 40-49.9 (morbid obesity) ([place]) 08/21/2015  . Coronary artery disease involving native coronary artery of native heart without angina pectoris 12/05/2014  . Urinary urgency 10/15/2014  . Spinal stenosis, lumbar region, with neurogenic claudication 01/02/2014  . Hyperlipidemia associated with type 2 diabetes mellitus ([place]) 10/25/2013  . COLONIC POLYPS, ADENOMATOUS  02/25/2007  . Diabetes mellitus type II, non insulin dependent () 02/25/2007  . Gout 02/25/2007  . Essential hypertension 02/25/2007  . Cough variant asthma 02/25/2007  . OSA on CPAP 02/25/2007  . FATIGUE, CHRONIC 02/25/2007  . HEADACHE, CHRONIC 02/25/2007  . GERD (gastroesophageal reflux disease) 02/25/2007    Past Surgical History:  Procedure Laterality Date  . BACK SURGERY    . BIOPSY  12/20/2019   Procedure: BIOPSY;  Surgeon: Milus Banister, MD;  Location: [facility];  Service: Endoscopy;;  . CARDIAC CATHETERIZATION    . COLONOSCOPY    . COLONOSCOPY WITH PROPOFOL N/A 12/20/2019    Procedure: COLONOSCOPY WITH PROPOFOL;  Surgeon: Milus Banister, MD;  Location: [facility];  Service: Endoscopy;  Laterality: N/A;  . ESOPHAGOGASTRODUODENOSCOPY (EGD) WITH PROPOFOL N/A 12/20/2019   Procedure: ESOPHAGOGASTRODUODENOSCOPY (EGD) WITH PROPOFOL;  Surgeon: Milus Banister, MD;  Location: [facility];  Service: Endoscopy;  Laterality: N/A;  . LEFT HEART CATH AND CORONARY ANGIOGRAPHY N/A 12/02/2016   Procedure: LEFT HEART CATH AND CORONARY ANGIOGRAPHY;  Surgeon: Jettie Booze, MD;  Location: [facility];  Service: Cardiovascular;  Laterality: N/A;  . LUMBAR LAMINECTOMY/DECOMPRESSION MICRODISCECTOMY N/A 01/02/2014   Procedure: CENTRAL DECOMPRESSION LUMBAR LAMINECTOMY L3-L4, L4-L5;  Surgeon: Tobi Bastos, MD;  Location: [facility];  Service: Orthopedics;  Laterality: N/A;  . neck fusion    . POLYPECTOMY  12/20/2019   Procedure: POLYPECTOMY;  Surgeon: Milus Banister, MD;  Location: [facility];  Service: Endoscopy;;       Family History  Problem Relation Age of Onset  . Colon cancer Mother   . Diabetes Father        siblings  . Heart disease Father        brother  . Heart attack Father   . Kidney disease Sister   . Heart failure Sister   . Heart disease Brother   . Heart attack Son 40  . Drug abuse Sister   . Heart disease Brother   . Deep vein thrombosis Brother   . Colon polyps Neg Hx     Social History   Tobacco Use  . Smoking status: Former Smoker    Quit date: 02/08/1969    Years since quitting: 51.3  . Smokeless tobacco: Former Systems developer    Quit date: Pharmacologist  . Vaping Use: Never used  Substance Use Topics  . Alcohol use: No    Alcohol/week: 0.0 standard drinks  . Drug use: No    Home Medications Prior to Admission medications   Medication Sig Start Date End Date Taking? Authorizing Provider  albuterol (ACCUNEB) 1.25 MG/3ML nebulizer solution Take 1 ampule by nebulization every 6 (six) hours as needed for wheezing.   Yes  [provider]  albuterol (VENTOLIN HFA) 108 (90 Base) MCG/ACT inhaler Inhale 2 puffs into the lungs every 6 (six) hours as needed for wheezing or shortness of breath. 05/05/20  Yes Stacks, Cletus Gash, MD  atorvastatin (LIPITOR) 40 MG tablet Take 40 mg by mouth daily. 1 Tablet Daily   Yes [provider]  bisoprolol (ZEBETA) 5 MG tablet Take 1/2 (one-half) tablet by mouth once daily Patient taking differently: Take 2.5 mg by mouth daily. 06/04/20  Yes [name], [name], MD  Budeson-Glycopyrrol-Formoterol (BREZTRI AEROSPHERE) 160-9-4.8 MCG/ACT AERO Inhale 2 puffs into the lungs in the morning and at bedtime. 01/23/20  Yes [name], [name], MD  Cholecalciferol (VITAMIN D3) 125 MCG (5000 UT) TABS Take 5,000 Units by mouth daily.  Yes [provider]  cloNIDine (CATAPRES) 0.2 MG tablet Take 1 tablet (0.2 mg total) by mouth 3 (three) times daily. (NEEDS TO BE SEEN BEFORE NEXT REFILL) 05/14/20  Yes [name], [name], MD  colchicine 0.6 MG tablet Take 0.6 mg by mouth daily. As needed   Yes [provider]  dapagliflozin propanediol (FARXIGA) 5 MG TABS tablet Take 1 tablet (5 mg total) by mouth daily. 01/02/20  Yes [name], [name], MD  famotidine (PEPCID) 20 MG tablet Take 1 tablet (20 mg total) by mouth at bedtime. 05/07/20  Yes Martyn Ehrich, NP  FEROSUL 325 (65 Fe) MG tablet Take 325 mg by mouth 3 (three) times daily.  08/15/19  Yes [provider]  furosemide (LASIX) 40 MG tablet Take 40 mg by mouth daily as needed (fluid retention.).   Yes [provider]  hydrALAZINE (APRESOLINE) 50 MG tablet Take 1 tablet (50 mg total) by mouth 3 (three) times daily. 03/14/20  Yes [name], [name], MD  isosorbide dinitrate (ISORDIL) 10 MG tablet TAKE 1 TABLET BY MOUTH THREE TIMES DAILY 01/11/20  Yes [name], [name], MD  mirabegron ER (MYRBETRIQ) 25 MG TB24 tablet Take 1 tablet (25 mg total) by mouth daily. 12/07/19  Yes [name], [name], MD   omeprazole (PRILOSEC) 40 MG capsule Take 1 capsule (40 mg total) by mouth 2 (two) times daily at 8 am and 10 pm. Take 30-60 minutes prior to breakfast. 05/13/20  Yes Ladene Artist, MD  polyethylene glycol (MIRALAX / GLYCOLAX) 17 g packet Take 17 g by mouth daily as needed for mild constipation.   Yes [provider]  RESTASIS 0.05 % ophthalmic emulsion Place 1 drop into both eyes in the morning and at bedtime. 12/07/19  Yes [provider]  sertraline (ZOLOFT) 100 MG tablet Take 1 tablet (100 mg total) by mouth daily. (NEEDS TO BE SEEN BEFORE NEXT REFILL) 05/29/20  Yes [name], [name], MD  tamsulosin (FLOMAX) 0.4 MG CAPS capsule Take 1 capsule (0.4 mg total) by mouth daily after supper. 11/22/19  Yes [name], [name], MD  azelastine (OPTIVAR) 0.05 % ophthalmic solution Place 1 drop into both eyes in the morning and at bedtime. Patient not taking: Reported on 06/06/2020 12/07/19   [provider]  Ferrous Sulfate Dried (HIGH POTENCY IRON) 65 MG TABS TAKE 1 TABLET BY MOUTH THREE TIMES DAILY WITH MEALS. DO NOT CRUSH, CHEW OR SPIT Patient not taking: Reported on 06/06/2020 05/14/20   [provider]  gabapentin (NEURONTIN) 100 MG capsule Take 1 capsule (100 mg total) by mouth 3 (three) times daily. Patient not taking: No sig reported 05/07/20   Martyn Ehrich, NP  prednisoLONE acetate (PRED FORTE) 1 % ophthalmic suspension Place 1 drop into both eyes in the morning and at bedtime. Patient not taking: Reported on 06/06/2020 12/07/19   [provider]    Allergies    Amlodipine besy-benazepril hcl, Oxycodone, Phenergan [promethazine hcl], and Hydrocodone  Review of Systems   Review of Systems  Constitutional: Negative for chills, diaphoresis, fatigue and fever.  HENT: Negative for congestion, sore throat and trouble swallowing.   Eyes: Negative for pain and visual disturbance.  Respiratory: Positive for shortness of breath. Negative for cough and  wheezing.   Cardiovascular: Positive for chest pain. Negative for palpitations and leg swelling.  Gastrointestinal: Negative for abdominal distention, abdominal pain, diarrhea, nausea and vomiting.  Genitourinary: Negative for difficulty urinating.  Musculoskeletal: Negative for back pain, neck pain and neck  stiffness.  Skin: Negative for pallor.  Neurological: Negative for dizziness, speech difficulty, weakness and headaches.  Psychiatric/Behavioral: Negative for confusion.    Physical Exam Updated Vital Signs BP (!) 165/90   Pulse 64   Temp 97.8 F (36.6 C) (Oral)   Resp 15   Ht _0  (1.702 m)   Wt 117.9 kg   SpO2 98%   BMI 40.72 kg/m   Physical Exam Constitutional:      General: He is not in acute distress.    Appearance: Normal appearance. He is not ill-appearing, toxic-appearing or diaphoretic.  HENT:     Mouth/Throat:     Mouth: Mucous membranes are moist.     Pharynx: Oropharynx is clear.  Eyes:     General: No scleral icterus.    Extraocular Movements: Extraocular movements intact.     Pupils: Pupils are equal, round, and reactive to light.  Cardiovascular:     Rate and Rhythm: Normal rate and regular rhythm.     Pulses: Normal pulses.     Heart sounds: Normal heart sounds.  Pulmonary:     Effort: Pulmonary effort is normal. No respiratory distress.     Breath sounds: Normal breath sounds. No stridor. No wheezing, rhonchi or rales.  Chest:     Chest wall: No tenderness.  Abdominal:     General: Abdomen is flat. There is no distension.     Palpations: Abdomen is soft.     Tenderness: There is no abdominal tenderness. There is no guarding or rebound.  Musculoskeletal:        General: No swelling or tenderness. Normal range of motion.     Cervical back: Normal range of motion and neck supple. No rigidity.     Right lower leg: No edema.     Left lower leg: No edema.  Skin:    General: Skin is warm and dry.     Capillary Refill: Capillary refill takes less  than 2 seconds.     Coloration: Skin is not pale.  Neurological:     General: No focal deficit present.     Mental Status: He is alert and oriented to person, place, and time.  Psychiatric:        Mood and Affect: Mood normal.        Behavior: Behavior normal.     ED Results / Procedures / Treatments   Labs (all labs ordered are listed, but only abnormal results are displayed) Labs Reviewed  CBC - Abnormal; Notable for the following components:      Result Value   RDW 15.6 (*)    All other components within normal limits  COMPREHENSIVE METABOLIC PANEL - Abnormal; Notable for the following components:   Glucose, Bld 128 (*)    Calcium 8.7 (*)    All other components within normal limits  BRAIN NATRIURETIC PEPTIDE - Abnormal; Notable for the following components:   B Natriuretic Peptide 279.0 (*)    All other components within normal limits  RESP PANEL BY RT-PCR (FLU A&B, COVID) [id number]  TROPONIN I (HIGH SENSITIVITY)  TROPONIN I (HIGH SENSITIVITY)    EKG EKG Interpretation  Date/Time:  Friday June 06 2020 14:18:55 EDT Ventricular Rate:  67 PR Interval:  157 QRS Duration: 113 QT Interval:  430 QTC Calculation: 454 R Axis:   80 Text Interpretation: Sinus rhythm Borderline intraventricular conduction delay No significant change since prior 9/21 Confirmed by Aletta Edouard [phone]) on 06/06/2020 2:23:23 PM   Radiology DG Chest 2 View  Result Date: 06/06/2020 CLINICAL DATA:  Chest pain and shortness of breath for 1 day EXAM: CHEST - 2 VIEW COMPARISON:  05/19/2020 FINDINGS: The heart size and mediastinal contours are within normal limits. Both lungs are clear. The visualized skeletal structures are unremarkable. IMPRESSION: No active cardiopulmonary disease. Electronically Signed   By: Randa Ngo M.D.   On: 06/06/2020 15:13    Procedures Procedures   Medications Ordered in ED Medications  nitroGLYCERIN (NITROSTAT) SL tablet 0.4 mg (0.4 mg Sublingual Given 06/06/20  1544)  aspirin chewable tablet 324 mg (324 mg Oral Given 06/06/20 1544)    ED Course  I have reviewed the triage vital signs and the nursing notes.  Pertinent labs & imaging results that were available during my care of the patient were reviewed by me and considered in my medical decision making (see chart for details).    MDM Rules/Calculators/A&P                          ADARRIUS GRAEFF is a 75 y.o. male with pertinent past medical history of CAD, CHF, hypertension, diabetes, GERD, hyperlipidemia presents emerged department today for chest pain.  Story is concerning for ACS, patient is currently stable.  HEAR score 6/7.  Aspirin and nitro given.  EKG interpreted me with nonspecific ST changes.  Chest x-ray interpreted me with no acute cardiopulmonary disease.  Patient states that he feels extremely better with nitro.  Think patient needs to be admitted, chest pain does appear cardiac in origin.  Pt admitted to Dr. Waldron [facility].  The patient appears reasonably stabilized for admission considering the current resources, flow, and capabilities available in the ED at this time, and I doubt any other [facility] requiring further screening and/or treatment in the ED prior to admission. I discussed this case with my attending physician who cosigned this note including patient's presenting symptoms, physical exam, and planned diagnostics and interventions. Attending physician stated agreement with plan or made changes to plan which were implemented.   Final Clinical Impression(s) / ED Diagnoses Final diagnoses:  Atypical chest pain    Rx / DC Orders ED Discharge Orders    None       Alfredia Client, PA-C 06/06/20 1857    Hayden Rasmussen, MD 06/07/20 [phone]

## 2020-06-06 NOTE — ED Notes (Signed)
Per EDP, patient had covid 60 days ago and does not need a repeat covid test.

## 2020-06-06 NOTE — ED Triage Notes (Signed)
Pt c/o of chest pain and shortness of breath x 1 day.

## 2020-06-06 NOTE — Chronic Care Management (AMB) (Signed)
Chronic Care Management   CCM RN Visit Note  06/05/2020 Name: Kenneth Fuller MRN: [id number] DOB: November 30, 1945  Subjective: Kenneth Fuller is a 75 y.o. year old male who is a primary care patient of [name], [name], MD. The care management team was consulted for assistance with disease management and care coordination needs.    Engaged with patient by telephone for follow up visit in response to provider referral for case management and/or care coordination services.   Consent to Services:  The patient was given information about Chronic Care Management services, agreed to services, and gave verbal consent prior to initiation of services.  Please see initial visit note for detailed documentation.   Patient agreed to services and verbal consent obtained.   Assessment: Review of patient past medical history, allergies, medications, health status, including review of consultants reports, laboratory and other test data, was performed as part of comprehensive evaluation and provision of chronic care management services.   SDOH (Social Determinants of Health) assessments and interventions performed:    CCM Care Plan  Allergies  Allergen Reactions  . Amlodipine Besy-Benazepril Hcl Swelling and Other (See Comments)    Makes tongue swell (lotrel)  . Oxycodone Itching  . Phenergan [Promethazine Hcl] Other (See Comments)    "I can't remember."  . Hydrocodone Itching    Can tolerate in low doses    No facility-administered encounter medications on file as of 06/05/2020.   Outpatient Encounter Medications as of 06/05/2020  Medication Sig  . albuterol (ACCUNEB) 1.25 MG/3ML nebulizer solution Take 1 ampule by nebulization every 6 (six) hours as needed for wheezing.  [name] albuterol (VENTOLIN HFA) 108 (90 Base) MCG/ACT inhaler Inhale 2 puffs into the lungs every 6 (six) hours as needed for wheezing or shortness of breath.  [name] atorvastatin (LIPITOR) 40 MG tablet Take 40 mg by mouth daily. 1  Tablet Daily  . azelastine (OPTIVAR) 0.05 % ophthalmic solution Place 1 drop into both eyes in the morning and at bedtime.  . bisoprolol (ZEBETA) 5 MG tablet Take 1/2 (one-half) tablet by mouth once daily  . Budeson-Glycopyrrol-Formoterol (BREZTRI AEROSPHERE) 160-9-4.8 MCG/ACT AERO Inhale 2 puffs into the lungs in the morning and at bedtime.  . Cholecalciferol (VITAMIN D3) 125 MCG (5000 UT) TABS Take 5,000 Units by mouth daily.  . cloNIDine (CATAPRES) 0.2 MG tablet Take 1 tablet (0.2 mg total) by mouth 3 (three) times daily. (NEEDS TO BE SEEN BEFORE NEXT REFILL)  . colchicine 0.6 MG tablet Take 0.6 mg by mouth daily. As needed  . dapagliflozin propanediol (FARXIGA) 5 MG TABS tablet Take 1 tablet (5 mg total) by mouth daily.  . famotidine (PEPCID) 20 MG tablet Take 1 tablet (20 mg total) by mouth at bedtime.  . FEROSUL 325 (65 Fe) MG tablet Take 325 mg by mouth 3 (three) times daily.   . furosemide (LASIX) 40 MG tablet Take 40 mg by mouth daily as needed (fluid retention.).  [name] gabapentin (NEURONTIN) 100 MG capsule Take 1 capsule (100 mg total) by mouth 3 (three) times daily.  . hydrALAZINE (APRESOLINE) 50 MG tablet Take 1 tablet (50 mg total) by mouth 3 (three) times daily.  . isosorbide dinitrate (ISORDIL) 10 MG tablet TAKE 1 TABLET BY MOUTH THREE TIMES DAILY  . mirabegron ER (MYRBETRIQ) 25 MG TB24 tablet Take 1 tablet (25 mg total) by mouth daily.  [name] omeprazole (PRILOSEC) 40 MG capsule Take 1 capsule (40 mg total) by mouth 2 (two) times daily at 8 am and 10  pm. Take 30-60 minutes prior to breakfast.  . polyethylene glycol (MIRALAX / GLYCOLAX) 17 g packet Take 17 g by mouth daily as needed for mild constipation.  . prednisoLONE acetate (PRED FORTE) 1 % ophthalmic suspension Place 1 drop into both eyes in the morning and at bedtime. (Patient not taking: Reported on 06/06/2020)  . RESTASIS 0.05 % ophthalmic emulsion Place 1 drop into both eyes in the morning and at bedtime.  . sertraline (ZOLOFT) 100  MG tablet Take 1 tablet (100 mg total) by mouth daily. (NEEDS TO BE SEEN BEFORE NEXT REFILL)  . tamsulosin (FLOMAX) 0.4 MG CAPS capsule Take 1 capsule (0.4 mg total) by mouth daily after supper.    Patient Active Problem List   Diagnosis Date Noted  . Right upper lobe pneumonia 05/07/2020  . Irritable larynx 05/07/2020  . AKI (acute kidney injury) ([place]) 06/05/2019  . Hyperkalemia 06/05/2019  . Acute renal failure (ARF) ([place]) 06/04/2019  . Dizziness 06/04/2019  . Near syncope 06/04/2019  . Insomnia   . CHF (congestive heart failure) ([place]) 01/06/2018  . Depression with anxiety 01/04/2018  . CKD (chronic kidney disease), stage III ([place]) 01/04/2018  . Chronic obstructive pulmonary disease ([place]) 01/04/2018  . Depression, recurrent ([place]) 11/11/2017  . Chronic diastolic heart failure ([place]) 04/13/2017  . Swelling of lower extremity 11/30/2016  . Obesity, Class III, BMI 40-49.9 (morbid obesity) ([place]) 08/21/2015  . Coronary artery disease involving native coronary artery of native heart without angina pectoris 12/05/2014  . Urinary urgency 10/15/2014  . Spinal stenosis, lumbar region, with neurogenic claudication 01/02/2014  . Hyperlipidemia associated with type 2 diabetes mellitus ([place]) 10/25/2013  . COLONIC POLYPS, ADENOMATOUS 02/25/2007  . Diabetes mellitus type II, non insulin dependent ([place]) 02/25/2007  . Gout 02/25/2007  . Essential hypertension 02/25/2007  . Cough variant asthma 02/25/2007  . OSA on CPAP 02/25/2007  . FATIGUE, CHRONIC 02/25/2007  . HEADACHE, CHRONIC 02/25/2007  . GERD (gastroesophageal reflux disease) 02/25/2007    Conditions to be addressed/monitored:CHF, HTN and Pulmonary Disease  Care Plan : RNCM: Respiratory Disorder  Updates made by Ilean China, RN since 06/06/2020 12:00 AM    Problem: Chronic Obstructive Asthma/COPD & OSA   Priority: Medium    Long-Range Goal: Manage Pulmonary Conditions   Start Date: 06/05/2020  This Visit's Progress: Not on track   Priority: Medium  Note:   Current Barriers:  . Chronic Disease Management support and education needs related to chronic obstructive asthma/COPD and obstructive sleep apnea . Does not have oxygen  Nurse Case Manager Clinical Goal(s):  . patient will work with pulmonologist to address needs related to medical management of pulmonary conditions . patient will meet with RN Care Manager to address self-management of pulmonary conditions  Interventions:  . 1:1 collaboration with Claretta Fraise, MD regarding development and update of comprehensive plan of care as evidenced by provider attestation and co-signature . Inter-disciplinary care team collaboration (see longitudinal plan of care) . Evaluation of current treatment plan related to pulmonary conditions and patient's adherence to plan as established by provider. . Chart reviewed including relevant office notes and lab results . Discussed use of oxygen and CPAP at night o Does not have oxygen that he wears during the day. Interested in a portable oxygen concentrator o Wears CPAP intermittently  . Reviewed and discussed medications o Compliant with medications . Encouraged to use CPAP regularly . Advised to f/u with pulmonary regarding oxygen needs and order for POC if necessary . Reviewed upcoming appointments .  Discussed plans with patient for ongoing care management follow up and provided patient with direct contact information for care management team . Provided with RNCM contact number and encouraged to reach out as needed  Self Care Activities:  . Self administers medications as prescribed . Performs ADL's independently . Performs IADL's independently . Calls provider office for new concerns or questions  Patient Goals Over the next 30 days, patient will: [name] Use CPAP each night . Follow-up with pulmonologist as directed and as needed . Keep all medical appointments . Take medications as prescribed . Call RNCM as needed    Care Plan : RNCM: Hypertension (Adult)  Updates made by Ilean China, RN since 06/06/2020 12:00 AM    Problem: Hypertension (Hypertension)     Long-Range Goal: Hypertension Monitored   Start Date: 05/14/2020  This Visit's Progress: Not on track  Recent Progress: Not on track  Priority: Medium  Note:   Objective:  BP Readings from Last 6 Encounters:  05/22/20 126/74  05/19/20 132/77  05/13/20 (!) 116/56  05/07/20 136/86   Current Barriers:  [name] Knowledge Deficits related to uncontrolled hypertension . Chronic Disease Management support and education needs related to hypertension  Case Manager Clinical Goal(s):  . patient will demonstrate improved adherence to prescribed treatment plan for hypertension as evidenced by taking all medications as prescribed, monitoring and recording blood pressure as directed, adhering to low sodium/DASH diet . patient will demonstrate improved health management independence as evidenced by checking blood pressure as directed and notifying PCP if SBP>160 or DBP > 90, taking all medications as prescribe, and adhering to a low sodium diet as discussed.  Interventions:  . Collaboration with [name], [name], MD regarding development and update of comprehensive plan of care as evidenced by provider attestation and co-signature . Inter-disciplinary care team collaboration (see longitudinal plan of care) . Evaluation of current treatment plan related to hypertension self management and patient's adherence to plan as established by provider. . Reviewed medications with patient and discussed importance of compliance . Discussed plans with patient for ongoing care management follow up and provided patient with direct contact information for care management team . Advised patient, providing education and rationale, to monitor blood pressure daily and record, calling PCP for findings outside established parameters.  . Encouraged patient to reach out to PCP with  any new or worsening symptoms or to seek emergency medical attention as necessary . Encouraged patient to reach out to [facility] as needed  Self-Care Activities: . Self administers medications as prescribed . Attends all scheduled provider appointments . Calls provider office for new concerns, questions, or BP outside discussed parameters . Checks BP and records as discussed  Patient Goals: . check blood pressure daily . write blood pressure results in a log or diary . ask questions to understand . Call PCP with any readings outside of recommended range . Call [facility] with any nursing needs [phone]   Care Plan : RNCM: Bradycardia  Updates made by Ilean China, RN since 06/06/2020 12:00 AM    Problem: Bradycardia and Irregular Heart Rate   Priority: High    Goal: Have Medical Evaluation for Bradycardia and Irregular Heart Rate   Start Date: 06/05/2020  This Visit's Progress: On track  Priority: High  Note:   Objective: Pulse Readings from Last 3 Encounters:  06/06/20 65  06/06/20 67  05/22/20 61    Current Barriers:  [name] Knowledge Deficits related to slow and irregular heart rate . Chronic  Disease Management support and education needs related to bradycardia  Nurse Case Manager Clinical Goal(s):  . patient will work with PCP office to address needs related to slow and irregular heart rate . Patient will seek appropriate medical attention as necessary  Interventions:  . 1:1 collaboration with Claretta Fraise, MD regarding development and update of comprehensive plan of care as evidenced by provider attestation and co-signature . Inter-disciplinary care team collaboration (see longitudinal plan of care) . Chart reviewed including recent office notes and lab results . Talked with patient and his daughter regarding slow and irregular heart rate today o Has ranged between 2 and 70 today . Discussed associated symptoms o Always has some shortness of breath o Felt a  little lightheaded during episode this morning and heart reate was in the 40s o Denies chest pain or palpitations  o O2 sats in the 90s . Collaborated with [facility] Triage nurse to schedule visit for 06/06/20 o Patient given appointment date and time . Advised patient and daughter to monitor and record pulse several times a day and as needed . Reminded that someone is on call for [facility] after hours if they have any questions or concerns regarding results or symptoms . Encouraged them to call 911 if patient's heart rate remains in the 40s or 50s especially if he symptomatic or if he develops any new or worsening symptoms . Encouraged patient to reach out to [facility] as needed  Self Care Activities:  . Attends all scheduled provider appointments . Performs ADL's independently . Performs IADL's independently . Calls provider office for new concerns or questions  Patient Goals Over the next 5 days, patient will: [name] Monitor and record heart rate several times a day and as needed . Have an appointment with medical provider for evaluation of irregular heart rate . Seek emergency medical attention if needed . Call RN Care Manager as needed [phone]    Follow Up Plan:  . Telephone follow up appointment with care management team member scheduled for: 06/11/20 with RNCM . The patient has been provided with contact information for the care management team and has been advised to call with any health related questions or concerns.  . Next PCP appointment scheduled for: 06/06/20 with Marjorie Smolder, FNP  [name], BSN, RN-BC [place] [facility] [place] Management Direct Dial: [phone]

## 2020-06-06 NOTE — Patient Instructions (Signed)
Visit Information  PATIENT GOALS: Goals Addressed            This Visit's Progress   . Have Medical Evaluation for Irregular Heart Rate       Timeframe:  Short-Term Goal Priority:  High Start Date:  06/05/20                           Expected End Date:   06/11/20                    Follow-up: 06/11/20  . Monitor and record heart rate several times a day and as needed . Have an appointment with medical provider for evaluation of irregular heart rate . Seek emergency medical attention if needed . Call RN Care Manager as needed [phone]    . Manage Pulmonary Conditions   Not on track    Timeframe:  Long-Range Goal Priority:  Medium Start Date:    06/05/20                         Expected End Date:  06/05/21                      Follow-up: 06/11/20  . Use CPAP each night . Follow-up with pulmonologist as directed and as needed . Keep all medical appointments . Take medications as prescribed . Call RNCM as needed    . Track and Manage My Blood Pressure-Hypertension   Not on track    Timeframe:  Long-Range Goal Priority:  Medium Start Date:   05/14/20                          Expected End Date:   05/14/21                      Follow Up Date 06/11/20   . check blood pressure daily . write blood pressure results in a log or diary . ask questions to understand . Call PCP with any readings outside of recommended range . Call RN Care Manager with any nursing needs [phone]    Why is this important?    You won't feel high blood pressure, but it can still hurt your blood vessels.   High blood pressure can cause heart or kidney problems. It can also cause a stroke.   Making lifestyle changes like losing a little weight or eating less salt will help.   Checking your blood pressure at home and at different times of the day can help to control blood pressure.   If the doctor prescribes medicine remember to take it the way the doctor ordered.   Call the office if you cannot  afford the medicine or if there are questions about it.     Notes:        Patient verbalizes understanding of instructions provided today and agrees to view in [place].    Follow Up Plan:  . Telephone follow up appointment with care management team member scheduled for: 06/11/20 with RNCM . The patient has been provided with contact information for the care management team and has been advised to call with any health related questions or concerns.  . Next PCP appointment scheduled for: 06/06/20 with Marjorie Smolder, FNP  [name], BSN, RN-BC [place] [facility] [place] Management Direct Dial: [phone]

## 2020-06-07 ENCOUNTER — Observation Stay (HOSPITAL_BASED_OUTPATIENT_CLINIC_OR_DEPARTMENT_OTHER): Payer: PPO

## 2020-06-07 DIAGNOSIS — R079 Chest pain, unspecified: Secondary | ICD-10-CM | POA: Diagnosis not present

## 2020-06-07 DIAGNOSIS — I25110 Atherosclerotic heart disease of native coronary artery with unstable angina pectoris: Secondary | ICD-10-CM | POA: Diagnosis not present

## 2020-06-07 DIAGNOSIS — I130 Hypertensive heart and chronic kidney disease with heart failure and stage 1 through stage 4 chronic kidney disease, or unspecified chronic kidney disease: Secondary | ICD-10-CM | POA: Diagnosis not present

## 2020-06-07 DIAGNOSIS — E1122 Type 2 diabetes mellitus with diabetic chronic kidney disease: Secondary | ICD-10-CM | POA: Diagnosis not present

## 2020-06-07 DIAGNOSIS — J449 Chronic obstructive pulmonary disease, unspecified: Secondary | ICD-10-CM | POA: Diagnosis not present

## 2020-06-07 DIAGNOSIS — J45909 Unspecified asthma, uncomplicated: Secondary | ICD-10-CM | POA: Diagnosis not present

## 2020-06-07 DIAGNOSIS — Z79899 Other long term (current) drug therapy: Secondary | ICD-10-CM | POA: Diagnosis not present

## 2020-06-07 DIAGNOSIS — N183 Chronic kidney disease, stage 3 unspecified: Secondary | ICD-10-CM | POA: Diagnosis not present

## 2020-06-07 DIAGNOSIS — I5032 Chronic diastolic (congestive) heart failure: Secondary | ICD-10-CM | POA: Diagnosis not present

## 2020-06-07 DIAGNOSIS — Z87891 Personal history of nicotine dependence: Secondary | ICD-10-CM | POA: Diagnosis not present

## 2020-06-07 LAB — ECHOCARDIOGRAM COMPLETE
Area-P 1/2: 4.77 cm2
Height: 67 in
S' Lateral: 3.8 cm
Single Plane A4C EF: 55.2 %
Weight: 4246.4 oz

## 2020-06-07 LAB — TROPONIN I (HIGH SENSITIVITY): Troponin I (High Sensitivity): 5 ng/L (ref <18)

## 2020-06-07 MED ORDER — ISOSORBIDE DINITRATE 20 MG PO TABS: 20.0000 mg | ORAL_TABLET | Freq: Three times a day (TID) | ORAL | 1 refills | Status: DC

## 2020-06-07 MED ORDER — NITROGLYCERIN 0.4 MG SL SUBL: 0.4000 mg | SUBLINGUAL_TABLET | SUBLINGUAL | 12 refills | Status: DC | PRN

## 2020-06-07 NOTE — Progress Notes (Signed)
Nsg Discharge Note  Admit Date:  06/06/2020 Discharge date: 06/07/2020   Kenneth Fuller General to be D/C'd Home per MD order.  AVS completed.  Copy for chart, and copy for patient signed, and dated. Patient/caregiver able to verbalize understanding.  Discharge Medication: Allergies as of 06/07/2020      Reactions   Amlodipine Besy-benazepril Hcl Swelling, Other (See Comments)   Makes tongue swell (lotrel)   Oxycodone Itching   Phenergan [promethazine Hcl] Other (See Comments)   "I can't remember."   Hydrocodone Itching   Can tolerate in low doses      Medication List    TAKE these medications   albuterol 1.25 MG/3ML nebulizer solution Commonly known as: ACCUNEB Take 1 ampule by nebulization every 6 (six) hours as needed for wheezing.   albuterol 108 (90 Base) MCG/ACT inhaler Commonly known as: VENTOLIN HFA Inhale 2 puffs into the lungs every 6 (six) hours as needed for wheezing or shortness of breath.   atorvastatin 40 MG tablet Commonly known as: LIPITOR Take 40 mg by mouth daily. 1 Tablet Daily   azelastine 0.05 % ophthalmic solution Commonly known as: OPTIVAR Place 1 drop into both eyes in the morning and at bedtime.   bisoprolol 5 MG tablet Commonly known as: ZEBETA Take 1/2 (one-half) tablet by mouth once daily What changed: See the new instructions.   Breztri Aerosphere 160-9-4.8 MCG/ACT Aero Generic drug: Budeson-Glycopyrrol-Formoterol Inhale 2 puffs into the lungs in the morning and at bedtime.   cloNIDine 0.2 MG tablet Commonly known as: CATAPRES Take 1 tablet (0.2 mg total) by mouth 3 (three) times daily. (NEEDS TO BE SEEN BEFORE NEXT REFILL)   colchicine 0.6 MG tablet Take 0.6 mg by mouth daily. As needed   dapagliflozin propanediol 5 MG Tabs tablet Commonly known as: Farxiga Take 1 tablet (5 mg total) by mouth daily.   famotidine 20 MG tablet Commonly known as: PEPCID Take 1 tablet (20 mg total) by mouth at bedtime.   FeroSul 325 (65 FE) MG  tablet Generic drug: ferrous sulfate Take 325 mg by mouth 3 (three) times daily.   furosemide 40 MG tablet Commonly known as: LASIX Take 40 mg by mouth daily as needed (fluid retention.).   gabapentin 100 MG capsule Commonly known as: NEURONTIN Take 1 capsule (100 mg total) by mouth 3 (three) times daily.   High Potency Iron 65 MG Tabs TAKE 1 TABLET BY MOUTH THREE TIMES DAILY WITH MEALS. DO NOT CRUSH, CHEW OR SPIT   hydrALAZINE 50 MG tablet Commonly known as: APRESOLINE Take 1 tablet (50 mg total) by mouth 3 (three) times daily.   isosorbide dinitrate 20 MG tablet Commonly known as: ISORDIL Take 1 tablet (20 mg total) by mouth 3 (three) times daily. What changed:   medication strength  how much to take   mirabegron ER 25 MG Tb24 tablet Commonly known as: Myrbetriq Take 1 tablet (25 mg total) by mouth daily.   nitroGLYCERIN 0.4 MG SL tablet Commonly known as: NITROSTAT Place 1 tablet (0.4 mg total) under the tongue every 5 (five) minutes x 3 doses as needed for chest pain.   omeprazole 40 MG capsule Commonly known as: PRILOSEC Take 1 capsule (40 mg total) by mouth 2 (two) times daily at 8 am and 10 pm. Take 30-60 minutes prior to breakfast.   polyethylene glycol 17 g packet Commonly known as: MIRALAX / GLYCOLAX Take 17 g by mouth daily as needed for mild constipation.   prednisoLONE acetate 1 % ophthalmic  suspension Commonly known as: PRED FORTE Place 1 drop into both eyes in the morning and at bedtime.   Restasis 0.05 % ophthalmic emulsion Generic drug: cycloSPORINE Place 1 drop into both eyes in the morning and at bedtime.   sertraline 100 MG tablet Commonly known as: ZOLOFT Take 1 tablet (100 mg total) by mouth daily. (NEEDS TO BE SEEN BEFORE NEXT REFILL)   tamsulosin 0.4 MG Caps capsule Commonly known as: FLOMAX Take 1 capsule (0.4 mg total) by mouth daily after supper.   Vitamin D3 125 MCG (5000 UT) Tabs Take 5,000 Units by mouth daily.        Discharge Assessment: Vitals:   06/07/20 0726 06/07/20 1503  BP:  (!) 164/82  Pulse:  64  Resp:  16  Temp:  98.2 F (36.8 C)  SpO2: 96% 96%   Skin clean, dry and intact without evidence of skin break down, no evidence of skin tears noted. IV catheter discontinued intact. Site without signs and symptoms of complications - no redness or edema noted at insertion site, patient denies c/o pain - only slight tenderness at site.  Dressing with slight pressure applied.  D/c Instructions-Education: Discharge instructions given to patient/family with verbalized understanding. D/c education completed with patient/family including follow up instructions, medication list, d/c activities limitations if indicated, with other d/c instructions as indicated by MD - patient able to verbalize understanding, all questions fully answered. Patient instructed to return to ED, call 911, or call MD for any changes in condition.  Patient escorted via [place], and D/C home via private auto.  Dorcas Mcmurray, LPN 0/17/5102 5:85 PM

## 2020-06-07 NOTE — Discharge Summary (Signed)
Physician Discharge Summary  Kenneth Fuller:[id number] DOB: 17-Oct-1945 DOA: 06/06/2020  PCP: [name], [name], MD  Admit date: 06/06/2020  Discharge date: 06/07/2020  Admitted From:Home  Disposition:  Home  Recommendations for Outpatient Follow-up:  1. Follow up with PCP in 1-2 weeks 2. Follow-up with cardiologist Dr. Orene Desanctis as recommended in the next 1-2 weeks 3. Continue on increased dose of Isordil 20 mg 3 times daily as prescribed 4. Continue with home medications as prior  Home Health: None  Equipment/Devices: None  Discharge Condition:Stable  CODE STATUS: Full  Diet recommendation: Heart Healthy/carb modified  Brief/Interim Summary:  Kenneth Fuller  is a 75 y.o. male, of chronic diastolic CHF, obesity, OSA, hyperlipidemia, diabetes mellitus, neuropathy, minor CAD, moderately severe restrictive lung disease, most recent cardiac cath in October 2018 showed a maximal stenosis of 10% in the mid LAD, patient presents to ED secondary to complaints of chest pain, reports chest pain has been going on for few days, at activity, and at rest, reports he felt it was worse today which prompted him to come to ED, he does report some mild dyspnea with activity as well, this pain is sharp sensation, and left chest, with some radiation to the left side/arm as well, he does report some dyspnea feels like COPD, denies any cough, fever or chills.  -Patient was admitted with evaluation of chest pain with mixed features that appear to be atypical likely related to musculoskeletal pain as there was chest wall tenderness.  ACS has been ruled out and troponins on repeat testing have been negative and EKG is reassuring even on repeat.  2D echocardiogram was also performed for further evaluation and LVEF was noted to be 55 to 60% with grade 1 diastolic dysfunction noted.  No wall motion abnormalities were noted.  He has no further chest pain noted at the time of discharge.  His home Isordil has  been increased from 10 to 20 mg 3 times daily.  He is currently stable for discharge and has had no other acute events noted throughout the course of this admission.  He will follow-up with his cardiologist as recommended.  Discharge Diagnoses:  Active Problems:   Coronary artery disease involving native coronary artery of native heart without angina pectoris   Chronic diastolic heart failure (HCC)   CKD (chronic kidney disease), stage III (HCC)   Chronic obstructive pulmonary disease (HCC)   CHF (congestive heart failure) (HCC)   Chest pain  Principal discharge diagnosis: Atypical chest pain likely musculoskeletal.  Discharge Instructions  Discharge Instructions    Diet - low sodium heart healthy   Complete by: As directed    Increase activity slowly   Complete by: As directed      Allergies as of 06/07/2020      Reactions   Amlodipine Besy-benazepril Hcl Swelling, Other (See Comments)   Makes tongue swell (lotrel)   Oxycodone Itching   Phenergan [promethazine Hcl] Other (See Comments)   "I can't remember."   Hydrocodone Itching   Can tolerate in low doses      Medication List    TAKE these medications   albuterol 1.25 MG/3ML nebulizer solution Commonly known as: ACCUNEB Take 1 ampule by nebulization every 6 (six) hours as needed for wheezing.   albuterol 108 (90 Base) MCG/ACT inhaler Commonly known as: VENTOLIN HFA Inhale 2 puffs into the lungs every 6 (six) hours as needed for wheezing or shortness of breath.   atorvastatin 40 MG tablet Commonly known as: LIPITOR Take  40 mg by mouth daily. 1 Tablet Daily   azelastine 0.05 % ophthalmic solution Commonly known as: OPTIVAR Place 1 drop into both eyes in the morning and at bedtime.   bisoprolol 5 MG tablet Commonly known as: ZEBETA Take 1/2 (one-half) tablet by mouth once daily What changed: See the new instructions.   Breztri Aerosphere 160-9-4.8 MCG/ACT Aero Generic drug:  Budeson-Glycopyrrol-Formoterol Inhale 2 puffs into the lungs in the morning and at bedtime.   cloNIDine 0.2 MG tablet Commonly known as: CATAPRES Take 1 tablet (0.2 mg total) by mouth 3 (three) times daily. (NEEDS TO BE SEEN BEFORE NEXT REFILL)   colchicine 0.6 MG tablet Take 0.6 mg by mouth daily. As needed   dapagliflozin propanediol 5 MG Tabs tablet Commonly known as: Farxiga Take 1 tablet (5 mg total) by mouth daily.   famotidine 20 MG tablet Commonly known as: PEPCID Take 1 tablet (20 mg total) by mouth at bedtime.   FeroSul 325 (65 FE) MG tablet Generic drug: ferrous sulfate Take 325 mg by mouth 3 (three) times daily.   furosemide 40 MG tablet Commonly known as: LASIX Take 40 mg by mouth daily as needed (fluid retention.).   gabapentin 100 MG capsule Commonly known as: NEURONTIN Take 1 capsule (100 mg total) by mouth 3 (three) times daily.   High Potency Iron 65 MG Tabs TAKE 1 TABLET BY MOUTH THREE TIMES DAILY WITH MEALS. DO NOT CRUSH, CHEW OR SPIT   hydrALAZINE 50 MG tablet Commonly known as: APRESOLINE Take 1 tablet (50 mg total) by mouth 3 (three) times daily.   isosorbide dinitrate 20 MG tablet Commonly known as: ISORDIL Take 1 tablet (20 mg total) by mouth 3 (three) times daily. What changed:   medication strength  how much to take   mirabegron ER 25 MG Tb24 tablet Commonly known as: Myrbetriq Take 1 tablet (25 mg total) by mouth daily.   nitroGLYCERIN 0.4 MG SL tablet Commonly known as: NITROSTAT Place 1 tablet (0.4 mg total) under the tongue every 5 (five) minutes x 3 doses as needed for chest pain.   omeprazole 40 MG capsule Commonly known as: PRILOSEC Take 1 capsule (40 mg total) by mouth 2 (two) times daily at 8 am and 10 pm. Take 30-60 minutes prior to breakfast.   polyethylene glycol 17 g packet Commonly known as: MIRALAX / GLYCOLAX Take 17 g by mouth daily as needed for mild constipation.   prednisoLONE acetate 1 % ophthalmic  suspension Commonly known as: PRED FORTE Place 1 drop into both eyes in the morning and at bedtime.   Restasis 0.05 % ophthalmic emulsion Generic drug: cycloSPORINE Place 1 drop into both eyes in the morning and at bedtime.   sertraline 100 MG tablet Commonly known as: ZOLOFT Take 1 tablet (100 mg total) by mouth daily. (NEEDS TO BE SEEN BEFORE NEXT REFILL)   tamsulosin 0.4 MG Caps capsule Commonly known as: FLOMAX Take 1 capsule (0.4 mg total) by mouth daily after supper.   Vitamin D3 125 MCG (5000 UT) Tabs Take 5,000 Units by mouth daily.       Follow-up Information    [name], [name], MD. Schedule an appointment as soon as possible for a visit in 1 week(s).   Specialties: Family Medicine, Cardiology Contact information: [place] [address] [phone]        Croitoru, Dani Gobble, MD. Schedule an appointment as soon as possible for a visit in 2 week(s).   Specialty: Cardiology Contact information:  [address] [place] [address] [phone]              Allergies  Allergen Reactions  . Amlodipine Besy-Benazepril Hcl Swelling and Other (See Comments)    Makes tongue swell (lotrel)  . Oxycodone Itching  . Phenergan [Promethazine Hcl] Other (See Comments)    "I can't remember."  . Hydrocodone Itching    Can tolerate in low doses    Consultations:  None   Procedures/Studies: DG Chest 2 View  Result Date: 06/06/2020 CLINICAL DATA:  Chest pain and shortness of breath for 1 day EXAM: CHEST - 2 VIEW COMPARISON:  05/19/2020 FINDINGS: The heart size and mediastinal contours are within normal limits. Both lungs are clear. The visualized skeletal structures are unremarkable. IMPRESSION: No active cardiopulmonary disease. Electronically Signed   By: Randa Ngo M.D.   On: 06/06/2020 15:13   DG Chest 2 View  Result Date: 05/20/2020 CLINICAL DATA:  75 year old male with a history of shortness of breath EXAM: CHEST - 2 VIEW  COMPARISON:  05/05/2020 FINDINGS: Cardiomediastinal silhouette unchanged in size and contour. No evidence of central vascular congestion. No interlobular septal thickening. No pneumothorax or pleural effusion. No confluent airspace disease. Surgical changes of the cervical region incompletely imaged. Degenerative changes spine. No acute displaced fracture IMPRESSION: Negative for acute cardiopulmonary disease Electronically Signed   By: Corrie Mckusick D.O.   On: 05/20/2020 13:08   CT ANGIO CHEST PE W OR WO CONTRAST  Result Date: 05/19/2020 CLINICAL DATA:  Dyspnea on exertion for 2 weeks, history of pneumonia EXAM: CT ANGIOGRAPHY CHEST WITH CONTRAST TECHNIQUE: Multidetector CT imaging of the chest was performed using the standard protocol during bolus administration of intravenous contrast. Multiplanar CT image reconstructions and MIPs were obtained to evaluate the vascular anatomy. CONTRAST:  42m OMNIPAQUE IOHEXOL 350 MG/ML SOLN COMPARISON:  05/05/2020, 09/22/2018 FINDINGS: Cardiovascular: This is a technically adequate evaluation of the pulmonary vasculature. No filling defects or pulmonary emboli. The heart is unremarkable without pericardial effusion. Normal caliber of the thoracic aorta. Mild atherosclerosis of the aortic arch. Mediastinum/Nodes: No enlarged mediastinal, hilar, or axillary lymph nodes. Thyroid gland, trachea, and esophagus demonstrate no significant findings. Lungs/Pleura: No acute airspace disease, effusion, or pneumothorax. The central airways are patent. Upper Abdomen: No acute abnormality. Musculoskeletal: No acute or destructive bony lesions. Reconstructed images demonstrate no additional findings. Review of the MIP images confirms the above findings. IMPRESSION: 1. No evidence of pulmonary embolus. 2. No acute intrathoracic process. 3.  Aortic Atherosclerosis (ICD10-I70.0). Electronically Signed   By: MRanda NgoM.D.   On: 05/19/2020 18:45   ECHOCARDIOGRAM COMPLETE  Result  Date: 06/07/2020    ECHOCARDIOGRAM REPORT   Patient Name:   RLINDEN TAGLIAFERRODate of Exam: 06/07/2020 Medical Rec #:  [id number]         Height:       67.0 in Accession #:    [id number]        Weight:       265.4 lb Date of Birth:  302-17-1947         BSA:          2.281 m Patient Age:    89years           BP:           141/85 mmHg Patient Gender: M                  HR:  73 bpm. Exam Location:  [facility] Procedure: 2D Echo, Cardiac Doppler and Color Doppler Indications:    Chest pain  History:        Patient has prior history of Echocardiogram examinations, most                 recent 09/24/2018. CHF, CAD, COPD, Signs/Symptoms:Dyspnea; Risk                 Factors:Dyslipidemia, Diabetes and Morbid obesity.  Sonographer:    Dustin Flock RDCS Referring Phys: [id number] DAWOOD Graciela Husbands  Sonographer Comments: Patient is morbidly obese. Image acquisition challenging due to COPD. IMPRESSIONS  1. Left ventricular ejection fraction, by estimation, is 55 to 60%. The left ventricle has normal function. The left ventricle has no regional wall motion abnormalities. There is mild left ventricular hypertrophy. Left ventricular diastolic parameters are consistent with Grade I diastolic dysfunction (impaired relaxation).  2. Right ventricular systolic function is normal. The right ventricular size is normal.  3. Left atrial size was moderately dilated.  4. The mitral valve is grossly normal. Trivial mitral valve regurgitation.  5. The aortic valve is tricuspid. Aortic valve regurgitation is not visualized.  6. The inferior vena cava is normal in size with greater than 50% respiratory variability, suggesting right atrial pressure of 3 mmHg. Comparison(s): Changes from prior study are noted. 09/24/2018: LVEF 50-55%. FINDINGS  Left Ventricle: Left ventricular ejection fraction, by estimation, is 55 to 60%. The left ventricle has normal function. The left ventricle has no regional wall motion abnormalities. The left  ventricular internal cavity size was normal in size. There is  mild left ventricular hypertrophy. Left ventricular diastolic parameters are consistent with Grade I diastolic dysfunction (impaired relaxation). Indeterminate filling pressures. Right Ventricle: The right ventricular size is normal. No increase in right ventricular wall thickness. Right ventricular systolic function is normal. Left Atrium: Left atrial size was moderately dilated. Right Atrium: Right atrial size was normal in size. Pericardium: There is no evidence of pericardial effusion. Mitral Valve: The mitral valve is grossly normal. Trivial mitral valve regurgitation. Tricuspid Valve: The tricuspid valve is grossly normal. Tricuspid valve regurgitation is trivial. Aortic Valve: The aortic valve is tricuspid. Aortic valve regurgitation is not visualized. Pulmonic Valve: The pulmonic valve was grossly normal. Pulmonic valve regurgitation is trivial. Aorta: The aortic root and ascending aorta are structurally normal, with no evidence of dilitation. Venous: The inferior vena cava is normal in size with greater than 50% respiratory variability, suggesting right atrial pressure of 3 mmHg. IAS/Shunts: No atrial level shunt detected by color flow Doppler.  LEFT VENTRICLE PLAX 2D LVIDd:         5.44 cm      Diastology LVIDs:         3.80 cm      LV e' medial:    8.16 cm/s LV PW:         1.15 cm      LV E/e' medial:  9.2 LV IVS:        0.99 cm      LV e' lateral:   6.74 cm/s LVOT diam:     2.20 cm      LV E/e' lateral: 11.2 LV SV:         63 LV SV Index:   28 LVOT Area:     3.80 cm  LV Volumes (MOD) LV vol d, MOD A4C: 185.0 ml LV vol s, MOD A4C: 82.8 ml LV SV MOD A4C:  185.0 ml RIGHT VENTRICLE RV Basal diam:  2.62 cm RV S prime:     14.80 cm/s TAPSE (M-mode): 3.6 cm LEFT ATRIUM             Index       RIGHT ATRIUM           Index LA diam:        3.50 cm 1.53 cm/m  RA Area:     19.50 cm LA Vol (A2C):   43.6 ml 19.12 ml/m RA Volume:   55.00 ml  24.12  ml/m LA Vol (A4C):   91.9 ml 40.30 ml/m LA Biplane Vol: 69.8 ml 30.61 ml/m  AORTIC VALVE LVOT Vmax:   69.80 cm/s LVOT Vmean:  52.300 cm/s LVOT VTI:    0.166 m  AORTA Ao Root diam: 3.20 cm MITRAL VALVE MV Area (PHT): 4.77 cm    SHUNTS MV Decel Time: 159 msec    Systemic VTI:  0.17 m MV E velocity: 75.40 cm/s  Systemic Diam: 2.20 cm MV A velocity: 76.30 cm/s MV E/A ratio:  0.99 Lyman Bishop MD Electronically signed by Lyman Bishop MD Signature Date/Time: 06/07/2020/12:45:28 PM    Final       Discharge Exam: Vitals:   06/07/20 0416 06/07/20 0726  BP: (!) 141/85   Pulse: 73   Resp: 18   Temp: 98.2 F (36.8 C)   SpO2: 95% 96%   Vitals:   06/06/20 2234 06/07/20 0054 06/07/20 0416 06/07/20 0726  BP:  (!) 155/78 (!) 141/85   Pulse:  65 73   Resp:  18 18   Temp:  97.9 F (36.6 C) 98.2 F (36.8 C)   TempSrc:  Oral Oral   SpO2: 95% 96% 95% 96%  Weight:      Height:        General: Pt is alert, awake, not in acute distress, morbidly obese Cardiovascular: RRR, S1/S2 +, no rubs, no gallops Respiratory: CTA bilaterally, no wheezing, no rhonchi Abdominal: Soft, NT, ND, bowel sounds + Extremities: no edema, no cyanosis    The results of significant diagnostics from this hospitalization (including imaging, microbiology, ancillary and laboratory) are listed below for reference.     Microbiology: No results found for this or any previous visit (from the past 240 hour(s)).   Labs: BNP (last 3 results) Recent Labs    06/06/20 1500  BNP 614.4*   Basic Metabolic Panel: Recent Labs  Lab 06/06/20 1500  NA 138  K 4.3  CL 102  CO2 27  GLUCOSE 128*  BUN 22  CREATININE 1.04  CALCIUM 8.7*   Liver Function Tests: Recent Labs  Lab 06/06/20 1500  AST 20  ALT 24  ALKPHOS 73  BILITOT 0.9  PROT 6.6  ALBUMIN 3.9   No results for input(s): LIPASE, AMYLASE in the last 168 hours. No results for input(s): AMMONIA in the last 168 hours. CBC: Recent Labs  Lab 06/06/20 1500   WBC 7.3  HGB 15.0  HCT 46.1  MCV 89.2  PLT 169   Cardiac Enzymes: No results for input(s): CKTOTAL, CKMB, CKMBINDEX, TROPONINI in the last 168 hours. BNP: Invalid input(s): POCBNP CBG: No results for input(s): GLUCAP in the last 168 hours. D-Dimer No results for input(s): DDIMER in the last 72 hours. Hgb A1c No results for input(s): HGBA1C in the last 72 hours. Lipid Profile No results for input(s): CHOL, HDL, LDLCALC, TRIG, CHOLHDL, LDLDIRECT in the last 72 hours. Thyroid function studies No results for input(s): TSH,  T4TOTAL, T3FREE, THYROIDAB in the last 72 hours.  Invalid input(s): FREET3 Anemia work up No results for input(s): VITAMINB12, FOLATE, FERRITIN, TIBC, IRON, RETICCTPCT in the last 72 hours. Urinalysis    Component Value Date/Time   COLORURINE YELLOW 06/04/2019 0155   APPEARANCEUR Clear 11/20/2019 1509   LABSPEC 1.015 06/04/2019 0155   PHURINE 5.0 06/04/2019 0155   GLUCOSEU 3+ (A) 11/20/2019 1509   HGBUR NEGATIVE 06/04/2019 0155   BILIRUBINUR Negative 11/20/2019 1509   KETONESUR NEGATIVE 06/04/2019 0155   PROTEINUR 1+ (A) 11/20/2019 1509   PROTEINUR NEGATIVE 06/04/2019 0155   UROBILINOGEN 0.2 12/26/2013 1135   NITRITE Negative 11/20/2019 1509   NITRITE NEGATIVE 06/04/2019 0155   LEUKOCYTESUR Negative 11/20/2019 1509   LEUKOCYTESUR NEGATIVE 06/04/2019 0155   Sepsis Labs Invalid input(s): PROCALCITONIN,  WBC,  LACTICIDVEN Microbiology No results found for this or any previous visit (from the past 240 hour(s)).   Time coordinating discharge: 35 minutes  SIGNED:   Rodena Goldmann, DO [facility] 06/07/2020, 1:49 PM  If 7PM-7AM, please contact night-coverage [email or web address]

## 2020-06-07 NOTE — Progress Notes (Signed)
  Echocardiogram 2D Echocardiogram has been performed.  Matilde Bash 06/07/2020, 9:59 AM

## 2020-06-09 NOTE — Progress Notes (Signed)
Cardiology Clinic Note   Patient Name: ARBOR COHEN Date of Encounter: 06/10/2020  Primary Care Provider:  [name], [name], MD Primary Cardiologist:  Sanda Klein, MD  Patient Profile    [name]. Mustin 75 y.o. male present to the clinic today for a follow up of his HTN, CAD, and ED visit for chest pain.   Past Medical History    Past Medical History:  Diagnosis Date  . Allergy   . Asthma   . CAD (coronary artery disease)   . CHF (congestive heart failure) ([place])   . Diabetes mellitus   . Fibromyalgia   . GERD (gastroesophageal reflux disease)   . GI bleeding   . Gout   . Hyperlipidemia   . Hypertension   . Hypogonadism male   . Insomnia   . MRSA cellulitis   . Neuropathy   . Obesity   . Shortness of breath dyspnea    with exertion   . Sleep apnea    cpap- 14   . Wheezing    no asthma diagnosis   Past Surgical History:  Procedure Laterality Date  . BACK SURGERY    . BIOPSY  12/20/2019   Procedure: BIOPSY;  Surgeon: Milus Banister, MD;  Location: [facility];  Service: Endoscopy;;  . CARDIAC CATHETERIZATION    . COLONOSCOPY    . COLONOSCOPY WITH PROPOFOL N/A 12/20/2019   Procedure: COLONOSCOPY WITH PROPOFOL;  Surgeon: Milus Banister, MD;  Location: [facility];  Service: Endoscopy;  Laterality: N/A;  . ESOPHAGOGASTRODUODENOSCOPY (EGD) WITH PROPOFOL N/A 12/20/2019   Procedure: ESOPHAGOGASTRODUODENOSCOPY (EGD) WITH PROPOFOL;  Surgeon: Milus Banister, MD;  Location: [facility];  Service: Endoscopy;  Laterality: N/A;  . LEFT HEART CATH AND CORONARY ANGIOGRAPHY N/A 12/02/2016   Procedure: LEFT HEART CATH AND CORONARY ANGIOGRAPHY;  Surgeon: Jettie Booze, MD;  Location: [facility];  Service: Cardiovascular;  Laterality: N/A;  . LUMBAR LAMINECTOMY/DECOMPRESSION MICRODISCECTOMY N/A 01/02/2014   Procedure: CENTRAL DECOMPRESSION LUMBAR LAMINECTOMY L3-L4, L4-L5;  Surgeon: Tobi Bastos, MD;  Location: [facility];  Service: Orthopedics;   Laterality: N/A;  . neck fusion    . POLYPECTOMY  12/20/2019   Procedure: POLYPECTOMY;  Surgeon: Milus Banister, MD;  Location: [facility];  Service: Endoscopy;;    Allergies  Allergies  Allergen Reactions  . Amlodipine Besy-Benazepril Hcl Swelling and Other (See Comments)    Makes tongue swell (lotrel)  . Oxycodone Itching  . Phenergan [Promethazine Hcl] Other (See Comments)    "I can't remember."  . Hydrocodone Itching    Can tolerate in low doses    History of Present Illness    Mr. [place]on[place] has a PMH of essential hypertension, coronary artery disease (LHC 10/18 showed 10% stenosis in mid LAD and mid RCA), chronic diastolic heart failure, near syncope, OSA on CPAP, COPD, GERD, CKD 3, gout, insomnia, dizziness and hyperkalemia.  He is also noted to have a history of angioedema with ACE inhibitors.  Echocardiogram showed normal LV function 50-55%, and intermediate diastolic dysfunction.  He was hospitalized with shortness of breath 11/19-12/19.  His BNP at that time was slightly elevated at 330, his previous baseline was 72.6 in 2017.  An echocardiogram during his hospitalization showed an EF of 45-50%.  During his hospitalization 8/20 his BNP was elevated at 176 and he diuresed 10 L.  His dry weight has been noted to be around 270 pounds.  He was last seen by Dr. Loletha Grayer on 04/11/2019.  During that  time he indicated that he was having an increased amount of stress.  His wife have advanced cirrhosis and had developed problems with hepatic encephalopathy.  He indicated that she would sleep most of the day and had periods of confusion.  He was taking care of his children.  He indicated they had been married for 10 years.  He was noted to have increased exertional dyspnea and is furosemide was increased to 80 mg daily.  Low-dose spironolactone was also added.  His blood pressure was also elevated at the time and was felt to be related to increased stress.  He had an appointment with Dr.  Warrick Parisian the following week for follow-up labs.  He presented to the clinic 09/21/2019 for follow-up evaluation and stated he felt well.  He continued to stay very physically active doing work around his house, cleaning his pool, and doing yard work.  He had continued to follow a strict diet.  He was  down to 256 pounds.  He had been able to stop his Lasix and he had not noticed any lower extremity edema in quite some time.  His blood pressure was better controlled and was staying in the 130s over 80s at home.  I had him maintain his diet, physical activity, current medications, and planned follow-up with Dr. Sallyanne Kuster in 6 months.  He was seen in follow-up by Dr. Sallyanne Kuster 04/29/2020.  During that time he continued to be under increased stress due to his wife cirrhosis diagnosis and recurrent problems with encephalopathy.  They were planning to go to [place] for his 75th birthday.  He was also looking forward to celebrating their 60th wedding anniversary.  He denied episodes of chest tightness.  He reported exertional dyspnea after climbing 1 flight of stairs.  His baseline BNP appeared to be around 170.  He presented to the emergency department 06/07/2020.  He reported that his chest discomfort has been ongoing for the last few days.  He noted chest discomfort both with and without activity.  He also reported some dyspnea that was attributed to his COPD.  He was admitted for evaluation.  It was felt that his chest pain was atypical/musculoskeletal in nature due to chest wall tenderness.  His high-sensitivity troponins were negative, EKG showed no acute changes, echocardiogram showed LVEF 55-60% with G1 DD and no wall motion abnormalities.  His Isodril was increased to 20 mg 3 times daily.  Who presents to the clinic today for follow-up evaluation states he feels fatigued, short of breath, and has noticed dizziness.  He reports that his blood pressure has been staying in the low 100s over 60's.  He has  not been able to perform the activities that he enjoys.  He reports that he stopped his furosemide because he was noticing back pain and his rings were not staying on his hands.  His weight is down 5-1/2 pounds today.  With stopping his furosemide his back pain went away.  He reports that he had a bout of pneumonia at the end of March and received antibiotics.  He reports that he also had a COVID-19 infection x2.  I will order a BMP, cardiac event monitor, reduce his hydralazine, and have him follow-up in 6 to 8 weeks.  Today he denies chest pain, shortness of breath, lower extremity edema, fatigue, palpitations, melena, hematuria, hemoptysis, diaphoresis, weakness, presyncope, syncope, orthopnea, and PND.   Home Medications    Prior to Admission medications   Medication Sig Start Date End Date Taking?  Authorizing Provider  albuterol (ACCUNEB) 1.25 MG/3ML nebulizer solution Take 1 ampule by nebulization every 6 (six) hours as needed for wheezing.    [provider]  albuterol (VENTOLIN HFA) 108 (90 Base) MCG/ACT inhaler Inhale 2 puffs into the lungs every 6 (six) hours as needed for wheezing or shortness of breath. 05/05/20   Claretta Fraise, MD  atorvastatin (LIPITOR) 40 MG tablet Take 40 mg by mouth daily. 1 Tablet Daily    [provider]  azelastine (OPTIVAR) 0.05 % ophthalmic solution Place 1 drop into both eyes in the morning and at bedtime. Patient not taking: Reported on 06/06/2020 12/07/19   [provider]  bisoprolol (ZEBETA) 5 MG tablet Take 1/2 (one-half) tablet by mouth once daily Patient taking differently: Take 2.5 mg by mouth daily. 06/04/20   [name], [name], MD  Budeson-Glycopyrrol-Formoterol (BREZTRI AEROSPHERE) 160-9-4.8 MCG/ACT AERO Inhale 2 puffs into the lungs in the morning and at bedtime. 01/23/20   [name], [name], MD  Cholecalciferol (VITAMIN D3) 125 MCG (5000 UT) TABS Take 5,000 Units by mouth daily.    [provider]  cloNIDine  (CATAPRES) 0.2 MG tablet Take 1 tablet (0.2 mg total) by mouth 3 (three) times daily. (NEEDS TO BE SEEN BEFORE NEXT REFILL) 05/14/20   [name], [name], MD  colchicine 0.6 MG tablet Take 0.6 mg by mouth daily. As needed    [provider]  dapagliflozin propanediol (FARXIGA) 5 MG TABS tablet Take 1 tablet (5 mg total) by mouth daily. 01/02/20   [name], [name], MD  famotidine (PEPCID) 20 MG tablet Take 1 tablet (20 mg total) by mouth at bedtime. 05/07/20   Martyn Ehrich, NP  FEROSUL 325 (65 Fe) MG tablet Take 325 mg by mouth 3 (three) times daily.  08/15/19   [provider]  Ferrous Sulfate Dried (HIGH POTENCY IRON) 65 MG TABS TAKE 1 TABLET BY MOUTH THREE TIMES DAILY WITH MEALS. DO NOT CRUSH, CHEW OR SPIT Patient not taking: Reported on 06/06/2020 05/14/20   [provider]  furosemide (LASIX) 40 MG tablet Take 40 mg by mouth daily as needed (fluid retention.).    [provider]  gabapentin (NEURONTIN) 100 MG capsule Take 1 capsule (100 mg total) by mouth 3 (three) times daily. Patient not taking: No sig reported 05/07/20   Martyn Ehrich, NP  hydrALAZINE (APRESOLINE) 50 MG tablet Take 1 tablet (50 mg total) by mouth 3 (three) times daily. 03/14/20   [name], [name], MD  isosorbide dinitrate (ISORDIL) 20 MG tablet Take 1 tablet (20 mg total) by mouth 3 (three) times daily. 06/07/20 07/07/20  Manuella Ghazi, Pratik D, DO  mirabegron ER (MYRBETRIQ) 25 MG TB24 tablet Take 1 tablet (25 mg total) by mouth daily. 12/07/19   [name], [name], MD  nitroGLYCERIN (NITROSTAT) 0.4 MG SL tablet Place 1 tablet (0.4 mg total) under the tongue every 5 (five) minutes x 3 doses as needed for chest pain. 06/07/20 07/07/20  Manuella Ghazi, Pratik D, DO  omeprazole (PRILOSEC) 40 MG capsule Take 1 capsule (40 mg total) by mouth 2 (two) times daily at 8 am and 10 pm. Take 30-60 minutes prior to breakfast. 05/13/20   Ladene Artist, MD  polyethylene glycol (MIRALAX / GLYCOLAX) 17 g packet Take 17 g  by mouth daily as needed for mild constipation.    [provider]  prednisoLONE acetate (PRED FORTE) 1 % ophthalmic suspension Place 1 drop into both eyes in the morning and at bedtime. Patient not  taking: Reported on 06/06/2020 12/07/19   [provider]  RESTASIS 0.05 % ophthalmic emulsion Place 1 drop into both eyes in the morning and at bedtime. 12/07/19   [provider]  sertraline (ZOLOFT) 100 MG tablet Take 1 tablet (100 mg total) by mouth daily. (NEEDS TO BE SEEN BEFORE NEXT REFILL) 05/29/20   [name], [name], MD  tamsulosin (FLOMAX) 0.4 MG CAPS capsule Take 1 capsule (0.4 mg total) by mouth daily after supper. 11/22/19   [name], [name], MD    Family History    Family History  Problem Relation Age of Onset  . Colon cancer Mother   . Diabetes Father        siblings  . Heart disease Father        brother  . Heart attack Father   . Kidney disease Sister   . Heart failure Sister   . Heart disease Brother   . Heart attack Son 45  . Drug abuse Sister   . Heart disease Brother   . Deep vein thrombosis Brother   . Colon polyps Neg Hx    He indicated that his mother is deceased. He indicated that his father is deceased. He indicated that six of his eight sisters are alive. He indicated that three of his five brothers are alive. He indicated that his maternal grandmother is deceased. He indicated that his maternal grandfather is deceased. He indicated that his paternal grandmother is deceased. He indicated that his paternal grandfather is deceased. He indicated that his daughter is alive. He indicated that both of his sons are alive. He indicated that the status of his neg hx is unknown.  Social History    Social History   Socioeconomic History  . Marital status: Married    Spouse name: Butch Penny  . Number of children: 3  . Years of education: 8  . Highest education level: GED or equivalent  Occupational History  . Occupation: retired/disability  1999    Employer: DISABLED    Comment: [employer]iles  Tobacco Use  . Smoking status: Former Smoker    Quit date: 02/08/1969    Years since quitting: 51.3  . Smokeless tobacco: Former Systems developer    Quit date: Pharmacologist  . Vaping Use: Never used  Substance and Sexual Activity  . Alcohol use: No    Alcohol/week: 0.0 standard drinks  . Drug use: No  . Sexual activity: Not Currently  Other Topics Concern  . Not on file  Social History Narrative   Drinks caffeine tea occasionally    Social Determinants of Health   Financial Resource Strain: Low Risk   . Difficulty of Paying Living Expenses: Not hard at all  Food Insecurity: No Food Insecurity  . Worried About Charity fundraiser in the Last Year: Never true  . Ran Out of Food in the Last Year: Never true  Transportation Needs: No Transportation Needs  . Lack of Transportation (Medical): No  . Lack of Transportation (Non-Medical): No  Physical Activity: Inactive  . Days of Exercise per Week: 0 days  . Minutes of Exercise per Session: 0 min  Stress: No Stress Concern Present  . Feeling of Stress : Not at all  Social Connections: Socially Integrated  . Frequency of Communication with Friends and Family: More than three times a week  . Frequency of Social Gatherings with Friends and Family: More than three times a week  . Attends Religious Services: More than 4 times per year  .  Active Member of Clubs or Organizations: Yes  . Attends Archivist Meetings: More than 4 times per year  . Marital Status: Married  Human resources officer Violence: Not At Risk  . Fear of Current or Ex-Partner: No  . Emotionally Abused: No  . Physically Abused: No  . Sexually Abused: No     Review of Systems    General:  No chills, fever, night sweats or weight changes.  Cardiovascular:  No chest pain, dyspnea on exertion, edema, orthopnea, palpitations, paroxysmal nocturnal dyspnea. Dermatological: No rash, lesions/masses Respiratory: No cough,  dyspnea Urologic: No hematuria, dysuria Abdominal:   No nausea, vomiting, diarrhea, bright red blood per rectum, melena, or hematemesis Neurologic:  No visual changes, wkns, changes in mental status. All other systems reviewed and are otherwise negative except as noted above.  Physical Exam    VS:  BP 108/64   Pulse 73   Wt 264 lb (119.7 kg)   SpO2 95%   BMI 41.35 kg/m  , BMI Body mass index is 41.35 kg/m. GEN: Well nourished, well developed, in no acute distress. HEENT: normal. Neck: Supple, no JVD, carotid bruits, or masses. Cardiac: RRR, no murmurs, rubs, or gallops. No clubbing, cyanosis, edema.  Radials/DP/PT 2+ and equal bilaterally.  Respiratory:  Respirations regular and unlabored, clear to auscultation bilaterally. GI: Soft, nontender, nondistended, BS + x 4. MS: no deformity or atrophy. Skin: warm and dry, no rash. Neuro:  Strength and sensation are intact. Psych: Normal affect.  Accessory Clinical Findings    Recent Labs: 05/07/2020: Pro B Natriuretic peptide (BNP) 219.0 06/06/2020: ALT 24; B Natriuretic Peptide 279.0; BUN 22; Creatinine, Ser 1.04; Hemoglobin 15.0; Platelets 169; Potassium 4.3; Sodium 138   Recent Lipid Panel    Component Value Date/Time   CHOL 90 (L) 10/04/2019 1349   CHOL 111 06/12/2012 1232   TRIG 142 10/04/2019 1349   TRIG 141 01/24/2014 1143   TRIG 192 (H) 06/12/2012 1232   HDL 40 10/04/2019 1349   HDL 38 (L) 01/24/2014 1143   HDL 33 (L) 06/12/2012 1232   CHOLHDL 2.3 10/04/2019 1349   CHOLHDL 4.8 11/22/2006 1712   VLDL 39 11/22/2006 1712   LDLCALC 26 10/04/2019 1349   LDLCALC 27 10/25/2013 1224   LDLCALC 40 06/12/2012 1232    ECG personally reviewed by me today-none today. EKG 06/03/2019 Sinus rhythm 76 bpm no ST or T wave deviation  Echocardiogram 09/24/2018 IMPRESSIONS    1. The left ventricle has low normal systolic function, with an ejection  fraction of 50-55%. The cavity size was moderately dilated. There is mild   concentric left ventricular hypertrophy. Diastolic dysfunction, grade  indeterminate. Indeterminate filling  pressures.  2. The right ventricle has normal systolic function. The cavity was  normal. There is no increase in right ventricular wall thickness.  3. The aortic valve is tricuspid. Mild thickening of the aortic valve.  Mild to moderate aortic annular calcification noted.  4. The mitral valve is grossly normal. There is mild mitral annular  calcification present.  5. The aorta is normal unless otherwise noted.  Echocardiogram 06/07/2020 IMPRESSIONS    1. Left ventricular ejection fraction, by estimation, is 55 to 60%. The  left ventricle has normal function. The left ventricle has no regional  wall motion abnormalities. There is mild left ventricular hypertrophy.  Left ventricular diastolic parameters  are consistent with Grade I diastolic dysfunction (impaired relaxation).  2. Right ventricular systolic function is normal. The right ventricular  size is normal.  3. Left atrial size was moderately dilated.  4. The mitral valve is grossly normal. Trivial mitral valve  regurgitation.  5. The aortic valve is tricuspid. Aortic valve regurgitation is not  visualized.  6. The inferior vena cava is normal in size with greater than 50%  respiratory variability, suggesting right atrial pressure of 3 mmHg.   Comparison(s): Changes from prior study are noted. 09/24/2018: LVEF 50-55%.  Assessment & Plan   1.  Chest pain/dizziness/palpitations-continues with dull chest ache.  Reporting episodes dizziness, palpitations, and lower blood pressure.  Presented to the emergency department on 06/06/2020 and was discharged on 06/07/2020.  Echocardiogram showed improved LV EF, G1 DD, and mildly dilated left atria.  High-sensitivity troponins were low and flat.  It was felt that his chest discomfort was musculoskeletal in nature due to chest wall pain. Order long-term cardiac event lab  monitor Reviewed emergency department testing Heart healthy low-sodium diet Maintain physical activity Order BMP  Chronic systolic and diastolic CHF- euvolemic today.  No increased dyspnea on exertion or activity intolerance today.    Echocardiogram showed improved LVEF, G1 DD, and mild dilated left atria.  Weight today 264 Continue bisoprolol, isosorbide dinitrate, Farxiga Change furosemide to as needed Daily weights Heart healthy low-sodium diet-salty 6 given Increase physical activity as tolerated  Essential hypertension-BP today 108/64.  Much better control at home. Continue  , bisoprolol, isosorbide dinitrate, clonidine Reduce hydralazine to 25 mg 3 times daily Heart healthy low-sodium diet-salty 6 given Increase physical activity as tolerated Maintain blood pressure log  Hyperlipidemia-05/28/2019: Cholesterol, Total 81; HDL 39; LDL Chol Calc (NIH) 23; Triglycerides 95 Continue atorvastatin Heart healthy low-sodium high-fiber diet Increase physical activity as tolerated  COPD- continues with shortness of breath. Noted to have markedly abnormal PFTs in the past.  Bisoprolol used due to its selective beta blocking properties. Continue bisoprolol, albuterol, Dulera, Continue home oxygen Monitor O2 saturation Increase physical activity as tolerated   Disposition: Follow-up with Dr. Sallyanne Kuster or me in 6-8 weeks   Jossie Ng. Cowen Pesqueira NP-C    06/10/2020, 2:24 PM [address] Office [phone] Fax [phone]  Notice: This dictation was prepared with Dragon dictation along with smaller phrase technology. Any transcriptional errors that result from this process are unintentional and may not be corrected upon review.  I spent 17 minutes examining this patient, reviewing medications, and using patient centered shared decision making involving her cardiac care.  Prior to her visit I spent greater than 20 minutes reviewing her  past medical history,  medications, and prior cardiac tests.

## 2020-06-10 ENCOUNTER — Encounter: Payer: Self-pay | Admitting: *Deleted

## 2020-06-10 ENCOUNTER — Other Ambulatory Visit: Payer: Self-pay

## 2020-06-10 ENCOUNTER — Ambulatory Visit: Payer: PPO | Admitting: General Practice

## 2020-06-10 ENCOUNTER — Encounter: Payer: Self-pay | Admitting: General Practice

## 2020-06-10 VITALS — BP 108/64 | HR 73 | Wt 264.0 lb

## 2020-06-10 DIAGNOSIS — E785 Hyperlipidemia, unspecified: Secondary | ICD-10-CM

## 2020-06-10 DIAGNOSIS — I5042 Chronic combined systolic (congestive) and diastolic (congestive) heart failure: Secondary | ICD-10-CM

## 2020-06-10 DIAGNOSIS — I10 Essential (primary) hypertension: Secondary | ICD-10-CM

## 2020-06-10 DIAGNOSIS — J449 Chronic obstructive pulmonary disease, unspecified: Secondary | ICD-10-CM

## 2020-06-10 DIAGNOSIS — R002 Palpitations: Secondary | ICD-10-CM | POA: Diagnosis not present

## 2020-06-10 DIAGNOSIS — R0789 Other chest pain: Secondary | ICD-10-CM

## 2020-06-10 DIAGNOSIS — E1169 Type 2 diabetes mellitus with other specified complication: Secondary | ICD-10-CM | POA: Diagnosis not present

## 2020-06-10 MED ORDER — HYDRALAZINE HCL 25 MG PO TABS: 25.0000 mg | ORAL_TABLET | Freq: Three times a day (TID) | ORAL | 11 refills | Status: DC

## 2020-06-10 NOTE — Progress Notes (Signed)
[facility] [place]

## 2020-06-10 NOTE — Progress Notes (Signed)
Patient ID: Kenneth Fuller, male   DOB: [birth year], 75 y.o.   MRN: [id number] Patient enrolled for [employer] to ship a 14 day cardiac event monitor to his home.

## 2020-06-10 NOTE — Patient Instructions (Addendum)
Medication Instructions:  TAKE FUROSEMIDE AS NEEDED FOR WEIGHT GAIN >3 POUNDS/DAY OR 5 POUNDS /WEEK  DECREASE HYDRALAZINE 25MG THREE TIMES DAILY-CALL IF YOUR ARE DIZZY TO LET us KNOW *If you need a refill on your cardiac medications before your next appointment, please call your pharmacy*  Lab Work:    BMET TODAY      Testing/Procedures:  Your physician has requested you wear your cardiac event monitor for _14____ days, (1-30). SEE BELOW  Special Instructions PLEASE READ AND FOLLOW SALTY 6-ATTACHED-1,851m daily  Follow-Up: Your next appointment:  6-8 week(s) In Person with MSanda Klein MD OR IF UNAVAILABLE JESSE CLEAVER, FNP-C   [facility] and your [facility] needs are our [facility].  As part of our continuing mission to provide you with exceptional heart care, we have created designated Provider Care Teams.  These Care Teams include your primary Cardiologist (physician) and Advanced Practice Providers (APPs -  Physician Assistants and Nurse Practitioners) who all work together to provide you with the care you need, when you need it.            6 SALTY THINGS TO AVOID     1,800MG DAILY    [employer] Cardiac Event Monitor Instructions Your physician has requested you wear your cardiac event monitor for _14____ days, (1-30). [employer] may call or text to confirm a shipping address. The monitor will be sent to a land address via UPS. [employer] will not ship a monitor to a PO BOX. It typically takes 3-5 days to receive your monitor after it has been enrolled. [employer] will assist with USPS tracking if your package is delayed. The telephone number for [employer] is [phone] Once you have received your monitor, please review the enclosed instructions. Instruction tutorials can also be viewed under help and settings on the enclosed cell phone. Your monitor has already been registered assigning a specific monitor serial # to you.  Applying the monitor Remove cell  phone from case and turn it on. The cell phone works as yDealerand needs to be within 1[employer]err[employer]of you at all times. The cell phone will need to be charged on a daily basis. We recommend you plug the cell phone into the enclosed charger at your bedside table every night.  Monitor batteries: You will receive two monitor batteries labelled #1 and #2. These are your recorders. Plug battery #2 onto the second connection on the enclosed charger. Keep one battery on the charger at all times. This will keep the monitor battery deactivated. It will also keep it fully charged for when you need to switch your monitor batteries. A small light will be blinking on the battery emblem when it is charging. The light on the battery emblem will remain on when the battery is fully charged.  Open package of a Monitor strip. Insert battery #1 into black hood on strip and gently squeeze monitor battery onto connection as indicated in instruction booklet. Set aside while preparing skin.  Choose location for your strip, vertical or horizontal, as indicated in the instruction booklet. Shave to remove all hair from location. There cannot be any lotions, oils, powders, or colognes on skin where monitor is to be applied. Wipe skin clean with enclosed Saline wipe. Dry skin completely.  Peel paper labeled #1 off the back of the Monitor strip exposing the adhesive. Place the monitor on the chest in the vertical or horizontal position shown in the instruction booklet. One arrow on the monitor strip must be pointing  upward. Carefully remove paper labeled #2, attaching remainder of strip to your skin. Try not to create any folds or wrinkles in the strip as you apply it.  Firmly press and release the circle in the center of the monitor battery. You will hear a small beep. This is turning the monitor battery on. The heart emblem on the monitor battery will light up every 5 seconds if the monitor battery in turned on and  connected to the patient securely. Do not push and hold the circle down as this turns the monitor battery off. The cell phone will locate the monitor battery. A screen will appear on the cell phone checking the connection of your monitor strip. This may read poor connection initially but change to good connection within the next minute. Once your monitor accepts the connection you will hear a series of 3 beeps followed by a climbing crescendo of beeps. A screen will appear on the cell phone showing the two monitor strip placement options. Touch the picture that demonstrates where you applied the monitor strip.  Your monitor strip and battery are waterproof. You are able to shower, bathe, or swim with the monitor on. They just ask you do not submerge deeper than 3 feet underwater. We recommend removing the monitor if you are swimming in a lake, river, or ocean.  Your monitor battery will need to be switched to a fully charged monitor battery approximately once a week. The cell phone will alert you of an action which needs to be made.  On the cell phone, tap for details to reveal connection status, monitor battery status, and cell phone battery status. The green dots indicates your monitor is in good status. A red dot indicates there is something that needs your attention.  To record a symptom, click the circle on the monitor battery. In 30-60 seconds a list of symptoms will appear on the cell phone. Select your symptom and tap save. Your monitor will record a sustained or significant arrhythmia regardless of you clicking the button. Some patients do not feel the heart rhythm irregularities. [employer] will notify us of any serious or critical events.  Refer to instruction booklet for instructions on switching batteries, changing strips, the Do not disturb or Pause features, or any additional questions.  Call [employer] at [phone], to confirm your monitor is transmitting and record  your baseline. They will answer any questions you may have regarding the monitor instructions at that time.  Returning the monitor to [place] all equipment back into blue box. Peel off strip of paper to expose adhesive and close box securely. There is a prepaid UPS shipping label on this box. Drop in a UPS drop box, or at a UPS facility like [employer]. You may also contact [employer] to arrange UPS to pick up monitor package at your home.

## 2020-06-11 ENCOUNTER — Telehealth: Payer: Self-pay | Admitting: Primary Care

## 2020-06-11 DIAGNOSIS — Z9989 Dependence on other enabling machines and devices: Secondary | ICD-10-CM

## 2020-06-11 DIAGNOSIS — G4734 Idiopathic sleep related nonobstructive alveolar hypoventilation: Secondary | ICD-10-CM

## 2020-06-11 DIAGNOSIS — G4733 Obstructive sleep apnea (adult) (pediatric): Secondary | ICD-10-CM

## 2020-06-11 LAB — BASIC METABOLIC PANEL
BUN/Creatinine Ratio: 23 (ref 10–24)
BUN: 29 mg/dL — ABNORMAL HIGH (ref 8–27)
CO2: 25 mmol/L (ref 20–29)
Calcium: 9.5 mg/dL (ref 8.6–10.2)
Chloride: 96 mmol/L (ref 96–106)
Creatinine, Ser: 1.24 mg/dL (ref 0.76–1.27)
Glucose: 177 mg/dL — ABNORMAL HIGH (ref 65–99)
Potassium: 4.8 mmol/L (ref 3.5–5.2)
Sodium: 138 mmol/L (ref 134–144)
eGFR: 61 mL/min/{1.73_m2} (ref >59)

## 2020-06-11 NOTE — Telephone Encounter (Signed)
ONO 05/09/20 - Patient spent 16 mins with SpO2 < 89. Lowest O2 72%, mean 91.5%. Needs CPAP titration study d/t nocturnal hypoxia on CPAP

## 2020-06-12 ENCOUNTER — Ambulatory Visit (INDEPENDENT_AMBULATORY_CARE_PROVIDER_SITE_OTHER): Payer: PPO | Admitting: *Deleted

## 2020-06-12 DIAGNOSIS — J45991 Cough variant asthma: Secondary | ICD-10-CM

## 2020-06-12 DIAGNOSIS — I10 Essential (primary) hypertension: Secondary | ICD-10-CM

## 2020-06-12 NOTE — Patient Instructions (Signed)
Visit Information  PATIENT GOALS: Goals Addressed            This Visit's Progress   . Have Medical Evaluation for Irregular Heart Rate   On track    Timeframe:  Short-Term Goal Priority:  High Start Date:  06/05/20                           Expected End Date:   06/11/20                    Follow-up: 07/04/20  . Wear and return heart monitor as instructed . Keep appointment with cardiologist . Call cardiologist or seek appropriate medical attention for any new or worsening symptoms . Call RN Care Manager as needed [phone]    . Manage Pulmonary Conditions   On track    Timeframe:  Long-Range Goal Priority:  Medium Start Date:    06/05/20                         Expected End Date:  06/05/21                      Follow-up: 07/04/20  . Use CPAP each night . Follow-up with pulmonologist in July 2022 as directed . Call pulmonologist and schedule sooner appointment if oxygen needs increase or if he has any new or worsening symptoms . Keep all medical appointments . Take medications as prescribed . Call [facility] as needed [phone]    . Track and Manage My Blood Pressure-Hypertension   On track    Timeframe:  Long-Range Goal Priority:  Medium Start Date:   05/14/20                          Expected End Date:   05/14/21                      Follow Up Date 06/11/20   . check blood pressure daily . write blood pressure results in a log or diary . ask questions to understand . Call PCP with any readings outside of recommended range . Call RN Care Manager with any nursing needs [phone]    Why is this important?    You won't feel high blood pressure, but it can still hurt your blood vessels.   High blood pressure can cause heart or kidney problems. It can also cause a stroke.   Making lifestyle changes like losing a little weight or eating less salt will help.   Checking your blood pressure at home and at different times of the day can help to control blood pressure.   If  the doctor prescribes medicine remember to take it the way the doctor ordered.   Call the office if you cannot afford the medicine or if there are questions about it.     Notes:        Patient verbalizes understanding of instructions provided today and agrees to view in [place].    Follow Up Plan:  . Telephone follow up appointment with care management team member scheduled for: 07/04/20 with RNCM . The patient has been provided with contact information for the care management team and has been advised to call with any health related questions or concerns.   [name], BSN, RN-BC Embedded Chronic Care Manager [facility]  Direct Dial: [phone]

## 2020-06-12 NOTE — Chronic Care Management (AMB) (Signed)
Chronic Care Management   CCM RN Visit Note  06/12/2020 Name: Kenneth Fuller MRN: [id number] DOB: Sep 29, 1945  Subjective: Kenneth Fuller is a 75 y.o. year old male who is a primary care patient of [name], [name], MD. The care management team was consulted for assistance with disease management and care coordination needs.    Engaged with patient by telephone for follow up visit in response to provider referral for case management and/or care coordination services.   Consent to Services:  The patient was given information about Chronic Care Management services, agreed to services, and gave verbal consent prior to initiation of services.  Please see initial visit note for detailed documentation.   Patient agreed to services and verbal consent obtained.   Assessment: Review of patient past medical history, allergies, medications, health status, including review of consultants reports, laboratory and other test data, was performed as part of comprehensive evaluation and provision of chronic care management services.   SDOH (Social Determinants of Health) assessments and interventions performed:    CCM Care Plan  Allergies  Allergen Reactions  . Amlodipine Besy-Benazepril Hcl Swelling and Other (See Comments)    Makes tongue swell (lotrel)  . Oxycodone Itching  . Phenergan [Promethazine Hcl] Other (See Comments)    "I can't remember."  . Hydrocodone Itching    Can tolerate in low doses    Outpatient Encounter Medications as of 06/12/2020  Medication Sig  . albuterol (ACCUNEB) 1.25 MG/3ML nebulizer solution Take 1 ampule by nebulization every 6 (six) hours as needed for wheezing.  [name] albuterol (VENTOLIN HFA) 108 (90 Base) MCG/ACT inhaler Inhale 2 puffs into the lungs every 6 (six) hours as needed for wheezing or shortness of breath.  [name] atorvastatin (LIPITOR) 40 MG tablet Take 40 mg by mouth daily. 1 Tablet Daily  . azelastine (OPTIVAR) 0.05 % ophthalmic solution Place 1 drop  into both eyes in the morning and at bedtime. (Patient not taking: Reported on 06/06/2020)  . bisoprolol (ZEBETA) 5 MG tablet Take 1/2 (one-half) tablet by mouth once daily (Patient taking differently: Take 2.5 mg by mouth daily.)  . Budeson-Glycopyrrol-Formoterol (BREZTRI AEROSPHERE) 160-9-4.8 MCG/ACT AERO Inhale 2 puffs into the lungs in the morning and at bedtime.  . Cholecalciferol (VITAMIN D3) 125 MCG (5000 UT) TABS Take 5,000 Units by mouth daily.  . cloNIDine (CATAPRES) 0.2 MG tablet Take 1 tablet (0.2 mg total) by mouth 3 (three) times daily. (NEEDS TO BE SEEN BEFORE NEXT REFILL)  . colchicine 0.6 MG tablet Take 0.6 mg by mouth daily. As needed  . dapagliflozin propanediol (FARXIGA) 5 MG TABS tablet Take 1 tablet (5 mg total) by mouth daily.  . famotidine (PEPCID) 20 MG tablet Take 1 tablet (20 mg total) by mouth at bedtime.  . FEROSUL 325 (65 Fe) MG tablet Take 325 mg by mouth 3 (three) times daily.   . Ferrous Sulfate Dried (HIGH POTENCY IRON) 65 MG TABS TAKE 1 TABLET BY MOUTH THREE TIMES DAILY WITH MEALS. DO NOT CRUSH, CHEW OR SPIT (Patient not taking: Reported on 06/06/2020)  . furosemide (LASIX) 40 MG tablet Take 40 mg by mouth daily as needed (fluid retention.).  [name] gabapentin (NEURONTIN) 100 MG capsule Take 1 capsule (100 mg total) by mouth 3 (three) times daily. (Patient not taking: No sig reported)  . hydrALAZINE (APRESOLINE) 25 MG tablet Take 1 tablet (25 mg total) by mouth 3 (three) times daily.  . isosorbide dinitrate (ISORDIL) 20 MG tablet Take 1 tablet (20 mg total) by  mouth 3 (three) times daily.  . mirabegron ER (MYRBETRIQ) 25 MG TB24 tablet Take 1 tablet (25 mg total) by mouth daily.  . nitroGLYCERIN (NITROSTAT) 0.4 MG SL tablet Place 1 tablet (0.4 mg total) under the tongue every 5 (five) minutes x 3 doses as needed for chest pain.  [name] omeprazole (PRILOSEC) 40 MG capsule Take 1 capsule (40 mg total) by mouth 2 (two) times daily at 8 am and 10 pm. Take 30-60 minutes prior to  breakfast.  . polyethylene glycol (MIRALAX / GLYCOLAX) 17 g packet Take 17 g by mouth daily as needed for mild constipation.  . prednisoLONE acetate (PRED FORTE) 1 % ophthalmic suspension Place 1 drop into both eyes in the morning and at bedtime. (Patient not taking: Reported on 06/06/2020)  . RESTASIS 0.05 % ophthalmic emulsion Place 1 drop into both eyes in the morning and at bedtime.  . sertraline (ZOLOFT) 100 MG tablet Take 1 tablet (100 mg total) by mouth daily. (NEEDS TO BE SEEN BEFORE NEXT REFILL)  . tamsulosin (FLOMAX) 0.4 MG CAPS capsule Take 1 capsule (0.4 mg total) by mouth daily after supper.   No facility-administered encounter medications on file as of 06/12/2020.    Patient Active Problem List   Diagnosis Date Noted  . Chest pain 06/06/2020  . Right upper lobe pneumonia 05/07/2020  . Irritable larynx 05/07/2020  . AKI (acute kidney injury) ([place]) 06/05/2019  . Hyperkalemia 06/05/2019  . Acute renal failure (ARF) ([place]) 06/04/2019  . Dizziness 06/04/2019  . Near syncope 06/04/2019  . Insomnia   . CHF (congestive heart failure) ([place]) 01/06/2018  . Depression with anxiety 01/04/2018  . CKD (chronic kidney disease), stage III ([place]) 01/04/2018  . Chronic obstructive pulmonary disease ([place]) 01/04/2018  . Depression, recurrent ([place]) 11/11/2017  . Chronic diastolic heart failure ([place]) 04/13/2017  . Swelling of lower extremity 11/30/2016  . Obesity, Class III, BMI 40-49.9 (morbid obesity) ([place]) 08/21/2015  . Coronary artery disease involving native coronary artery of native heart without angina pectoris 12/05/2014  . Urinary urgency 10/15/2014  . Spinal stenosis, lumbar region, with neurogenic claudication 01/02/2014  . Hyperlipidemia associated with type 2 diabetes mellitus ([place]) 10/25/2013  . COLONIC POLYPS, ADENOMATOUS 02/25/2007  . Diabetes mellitus type II, non insulin dependent ([place]) 02/25/2007  . Gout 02/25/2007  . Essential hypertension 02/25/2007  . Cough variant asthma  02/25/2007  . OSA on CPAP 02/25/2007  . FATIGUE, CHRONIC 02/25/2007  . HEADACHE, CHRONIC 02/25/2007  . GERD (gastroesophageal reflux disease) 02/25/2007    Conditions to be addressed/monitored: DM, HTN, CHF, anxiety, depression, CAD,CKD,COPD, anemia  Care Plan : RNCM: Respiratory Disorder  Updates made by Ilean China, RN since 06/12/2020 12:00 AM    Problem: Chronic Obstructive Asthma/COPD & OSA   Priority: Medium    Long-Range Goal: Manage Pulmonary Conditions   Start Date: 06/05/2020  This Visit's Progress: On track  Recent Progress: Not on track  Priority: Medium  Note:   Current Barriers:  . Chronic Disease Management support and education needs related to chronic obstructive asthma/COPD and obstructive sleep apnea . Does not have oxygen  Nurse Case Manager Clinical Goal(s):  . patient will work with pulmonologist to address needs related to medical management of pulmonary conditions . patient will meet with RN Care Manager to address self-management of pulmonary conditions  Interventions:  . 1:1 collaboration with Claretta Fraise, MD regarding development and update of comprehensive plan of care as evidenced by provider attestation and co-signature . Inter-disciplinary care team collaboration (  see longitudinal plan of care) . Evaluation of current treatment plan related to pulmonary conditions and patient's adherence to plan as established by provider. . Chart reviewed including relevant office notes and lab results . Discussed use of oxygen and CPAP at night o Does not have oxygen that he wears during the day. Interested in a portable oxygen concentrator o Wearing CPAP nightly . Reviewed and discussed medications o Compliant with medications . Advised to f/u with pulmonary regarding oxygen needs and order for POC if necessary . Reviewed upcoming appointments o On recall list for f/u with pulmonologist in July o Advised to call and schedule sooner appointment if  needed . Discussed plans with patient for ongoing care management follow up and provided patient with direct contact information for care management team . Provided with RNCM contact number and encouraged to reach out as needed  Self Care Activities:  . Self administers medications as prescribed . Performs ADL's independently . Performs IADL's independently . Calls provider office for new concerns or questions  Patient Goals Over the next 30 days, patient will: [name] Use CPAP each night . Follow-up with pulmonologist in July 2022 as directed . Call pulmonologist and schedule sooner appointment if oxygen needs increase or if he has any new or worsening symptoms . Keep all medical appointments . Take medications as prescribed . Call [facility] as needed [phone]   Care Plan : RNCM: Hypertension (Adult)  Updates made by Ilean China, RN since 06/12/2020 12:00 AM    Problem: Hypertension (Hypertension)     Long-Range Goal: Hypertension Monitored   Start Date: 05/14/2020  This Visit's Progress: On track  Recent Progress: Not on track  Priority: Medium  Note:   Objective:  BP Readings from Last 3 Encounters:  06/10/20 108/64  06/07/20 (!) 164/82  06/06/20 (!) 157/86   Current Barriers:  [name] Knowledge Deficits related to uncontrolled hypertension . Chronic Disease Management support and education needs related to hypertension  Case Manager Clinical Goal(s):  . patient will demonstrate improved adherence to prescribed treatment plan for hypertension as evidenced by taking all medications as prescribed, monitoring and recording blood pressure as directed, adhering to low sodium/DASH diet . patient will demonstrate improved health management independence as evidenced by checking blood pressure as directed and notifying PCP if SBP>160 or DBP > 90, taking all medications as prescribe, and adhering to a low sodium diet as discussed.  Interventions:  . Collaboration with [name], [name], MD  regarding development and update of comprehensive plan of care as evidenced by provider attestation and co-signature . Inter-disciplinary care team collaboration (see longitudinal plan of care) . Evaluation of current treatment plan related to hypertension self management and patient's adherence to plan as established by provider. . Reviewed medications with patient and discussed importance of compliance . Discussed plans with patient for ongoing care management follow up and provided patient with direct contact information for care management team . Advised patient, providing education and rationale, to monitor blood pressure daily and record, calling PCP for findings outside established parameters.  . Encouraged patient to reach out to PCP with any new or worsening symptoms or to seek emergency medical attention as necessary . Encouraged patient to reach out to [facility] as needed  Self-Care Activities: . Self administers medications as prescribed . Attends all scheduled provider appointments . Calls provider office for new concerns, questions, or BP outside discussed parameters . Checks BP and records as discussed  Patient Goals: . check blood pressure daily .  write blood pressure results in a log or diary . ask questions to understand . Keep appointment with cardiologist . Call PCP with any readings outside of recommended range . Call [place] with any nursing needs [phone]   Care Plan : RNCM: Bradycardia  Updates made by Ilean China, RN since 06/12/2020 12:00 AM    Problem: Bradycardia and Irregular Heart Rate   Priority: High    Goal: Have Medical Evaluation for Bradycardia and Irregular Heart Rate   Start Date: 06/05/2020  This Visit's Progress: On track  Recent Progress: On track  Priority: High  Note:   Objective: Pulse Readings from Last 3 Encounters:  06/10/20 73  06/07/20 64  06/06/20 67   Current Barriers:  [name] Knowledge Deficits related to slow and  irregular heart rate . Chronic Disease Management support and education needs related to bradycardia  Nurse Case Manager Clinical Goal(s):  . patient will work with PCP office to address needs related to slow and irregular heart rate . Patient will seek appropriate medical attention as necessary  Interventions:  . 1:1 collaboration with Claretta Fraise, MD regarding development and update of comprehensive plan of care as evidenced by provider attestation and co-signature . Inter-disciplinary care team collaboration (see longitudinal plan of care) . Chart reviewed including recent office notes, hospital notes, cardiology notes, and lab results . Reviewed and discussed medications . Discussed recent hospital and Cardio visit o Heart monitor has been shipped to him. He will wear it for 14 days . Reviewed upcoming appointments . Encouraged patient to reach out to cardiologist or seek appropriate medical care for any new or worsening symptoms . Encouraged patient to reach out to [facility] as needed  Self Care Activities:  . Attends all scheduled provider appointments . Performs ADL's independently . Performs IADL's independently . Calls provider office for new concerns or questions  Patient Goals Over the next 30 days, patient will: . Wear and return heart monitor as instructed . Keep appointment with cardiologist . Call cardiologist or seek appropriate medical attention for any new or worsening symptoms . Call RN Care Manager as needed [phone]     Follow Up Plan:  . Telephone follow up appointment with care management team member scheduled for: 07/04/20 with RNCM . The patient has been provided with contact information for the care management team and has been advised to call with any health related questions or concerns.   [name], BSN, RN-BC Embedded Chronic Care Manager [facility] [place] [facility] Direct Dial: [phone]

## 2020-06-13 NOTE — Telephone Encounter (Signed)
Needs repeat ONO on CPAP

## 2020-06-13 NOTE — Telephone Encounter (Signed)
Called and spoke with pt and restated to him since he did wear his CPAP while doing the ONO, we were going to order cpap titration to see what the results of that is. Pt verbalized understanding. Cpap titration ordered. Nothing further needed.

## 2020-06-13 NOTE — Telephone Encounter (Signed)
Pt calling back to state that he did wear his cpap machine, pt was confused with what test he was asked about. Pt thought the nurse was asking about his walk test that he had in the office. Pt can be reached at [phone].

## 2020-06-13 NOTE — Telephone Encounter (Signed)
Called and spoke with pt letting him know the results of the ONO and asked pt if he wore his cpap while doing the test. Pt said that when he did the ONO, he did not wear his CPAP during the test.  Beth, please advise if you still want pt to have a cpap titration performed after knowing this info or if you want to have pt repeat the ONO and make it known that the test must be done while wearing the cpap.

## 2020-06-15 ENCOUNTER — Ambulatory Visit (INDEPENDENT_AMBULATORY_CARE_PROVIDER_SITE_OTHER): Payer: PPO

## 2020-06-15 DIAGNOSIS — R0789 Other chest pain: Secondary | ICD-10-CM

## 2020-06-15 DIAGNOSIS — R002 Palpitations: Secondary | ICD-10-CM

## 2020-06-17 ENCOUNTER — Telehealth: Payer: PPO

## 2020-06-19 ENCOUNTER — Other Ambulatory Visit: Payer: Self-pay | Admitting: Family Medicine

## 2020-06-19 DIAGNOSIS — F339 Major depressive disorder, recurrent, unspecified: Secondary | ICD-10-CM

## 2020-06-20 ENCOUNTER — Telehealth: Payer: Self-pay | Admitting: Cardiovascular Disease

## 2020-06-20 DIAGNOSIS — I10 Essential (primary) hypertension: Secondary | ICD-10-CM

## 2020-06-20 NOTE — Telephone Encounter (Signed)
Pt c/o BP issue: STAT if pt c/o blurred vision, one-sided weakness or slurred speech  1. What are your last 5 BP readings?  05/12 168/105 8 am 164/106 11 am 166/101 Recent: 154/101   2. Are you having any other symptoms (ex. Dizziness, headache, blurred vision, passed out)?   3. What is your BP issue?  Pt said since his last visit his meds changed, Jesse decreased his Isordril to 10 mg and Hydralazine to 25 mg. Since then his BP been elevated

## 2020-06-20 NOTE — Telephone Encounter (Signed)
p 

## 2020-06-20 NOTE — Telephone Encounter (Signed)
Spoke with the patient about his blood pressures. He stated that since his last visit that he has been having elevated blood pressures and has been light headed.  Pulse has been running from 65-75's.  The patient stated that his Hydralazine and Isosorbide Dinitrate were decreased at the last visit.   He is currently taking: Bisoprolol 2.5 mg daily Hydralazine 25 mg tid (recently decreased) Furosemide 40 mg as needed Isosorbide Dinitrate 20 mg tid  Clonidine 0.2 mg tid

## 2020-06-24 MED ORDER — HYDRALAZINE HCL 50 MG PO TABS: 50.0000 mg | ORAL_TABLET | Freq: Three times a day (TID) | ORAL | 1 refills | Status: DC

## 2020-06-24 NOTE — Addendum Note (Signed)
Addended by: Ricci Barker on: 06/24/2020 08:34 AM   Modules accepted: Orders

## 2020-06-24 NOTE — Telephone Encounter (Signed)
Patient has been made aware to increase the Hydralazine to 50 mg tid. A new prescription has been sent in for him. He will keep the office aware of his blood pressures by keeping a daily log.

## 2020-06-26 ENCOUNTER — Other Ambulatory Visit: Payer: Self-pay

## 2020-06-26 ENCOUNTER — Ambulatory Visit: Payer: PPO | Attending: Primary Care | Admitting: Pulmonary Disease

## 2020-06-26 DIAGNOSIS — G4734 Idiopathic sleep related nonobstructive alveolar hypoventilation: Secondary | ICD-10-CM

## 2020-06-26 DIAGNOSIS — G4733 Obstructive sleep apnea (adult) (pediatric): Secondary | ICD-10-CM | POA: Diagnosis not present

## 2020-06-26 DIAGNOSIS — Z9989 Dependence on other enabling machines and devices: Secondary | ICD-10-CM

## 2020-07-01 NOTE — Procedures (Signed)
     Patient Name: Kenneth Fuller, Kenneth Fuller Date: 06/26/2020 Gender: Male D.O.B: [birth year] Age (years): 44 Referring Provider: Geraldo Pitter NP Height (inches): 67 Interpreting Physician: Chesley Mires MD, ABSM Weight (lbs): 274 RPSGT: Peak, Darryn BMI: 43 MRN: [id number] Neck Size: 18.75  CLINICAL INFORMATION The patient is referred for a BiPAP titration to treat sleep apnea.  SLEEP STUDY TECHNIQUE As per the AASM Manual for the Scoring of Sleep and Associated Events v2.3 (April 2016) with a hypopnea requiring 4% desaturations.  The channels recorded and monitored were frontal, central and occipital EEG, electrooculogram (EOG), submentalis EMG (chin), nasal and oral airflow, thoracic and abdominal wall motion, anterior tibialis EMG, snore microphone, electrocardiogram, and pulse oximetry. Bilevel positive airway pressure (BPAP) was initiated at the beginning of the study and titrated to treat sleep-disordered breathing.  MEDICATIONS Medications self-administered by patient taken the night of the study : N/A  RESPIRATORY PARAMETERS Optimal IPAP Pressure (cm): 15 AHI at Optimal Pressure (/hr) 3 Optimal EPAP Pressure (cm): 11   Overall Minimal O2 (%): 85.00 Minimal O2 at Optimal Pressure (%): 88.0  He was started on CPAP.  He had trouble adjusting to pressure setting with CPAP and had trouble with exhaling.  He was switched to Bipap.  He did best with Bipap 15/11 cm H2O.  He had more sleep disruption and mixed events with higher Bipap settings.  SLEEP ARCHITECTURE Start Time: 9:38:26 PM Stop Time: 4:51:22 AM Total Time (min): 432.9 Total Sleep Time (min): 369 Sleep Latency (min): 10.6 Sleep Efficiency (%): 85.2 REM Latency (min): 84.0 WASO (min): 53.3 Stage N1 (%): 7.99 Stage N2 (%): 75.75 Stage N3 (%): 0.00 Stage R (%): 16.3 Supine (%): 59.89 Arousal Index (/hr): 14.5   CARDIAC DATA The 2 lead EKG demonstrated sinus rhythm. The mean heart rate was 66.51 beats per minute.  Other EKG findings include: None.  LEG MOVEMENT DATA The total Periodic Limb Movements of Sleep (PLMS) were 0. The PLMS index was 0.00. A PLMS index of <15 is considered normal in adults.  IMPRESSIONS - He did best with Bipap 15/11 cm H2O.  He was observed in REM and supine sleep with this pressure setting. - He wasn't able to tolerate CPAP due to difficulty with exhaling while on CPAP. - He did not require the use of supplemental oxygen during this study.  DIAGNOSIS - Obstructive Sleep Apnea (G47.33)  RECOMMENDATIONS - Trial of BiPAP therapy on 15/11 cm H2O with a Medium size Resmed Full Face Mask AirFit F30 mask and heated humidification. - Avoid alcohol, sedatives and other CNS depressants that may worsen sleep apnea and disrupt normal sleep architecture. - Sleep hygiene should be reviewed to assess factors that may improve sleep quality. - Weight management and regular exercise should be initiated or continued.  [Electronically signed] 07/01/2020 03:22 PM  Chesley Mires MD, [place], American Board of Sleep Medicine   NPI: [id number]

## 2020-07-02 ENCOUNTER — Other Ambulatory Visit: Payer: Self-pay | Admitting: Family Medicine

## 2020-07-02 ENCOUNTER — Telehealth: Payer: Self-pay | Admitting: Primary Care

## 2020-07-02 DIAGNOSIS — G4733 Obstructive sleep apnea (adult) (pediatric): Secondary | ICD-10-CM

## 2020-07-02 DIAGNOSIS — G4734 Idiopathic sleep related nonobstructive alveolar hypoventilation: Secondary | ICD-10-CM

## 2020-07-02 NOTE — Telephone Encounter (Signed)
Please notify patient that we are recommend Trial of BiPAP therapy on 15/11 cm H2O with a Medium size Resmed Full Face Mask AirFit F30 mask and heated humidification (priority). He did not require supplemental oxygen. I think he has a CPAP, will need to see if this machine can be used for these setting.  He should establish with one of our sleep doctors for severe OSA.

## 2020-07-03 ENCOUNTER — Encounter: Payer: Self-pay | Admitting: *Deleted

## 2020-07-03 NOTE — Telephone Encounter (Signed)
Called and spoke with pt letting him know the results of the titration study and told him that it is recommended him to switch from CPAP to BIPAP. Pt verbalized understanding.  Pt has also been scheduled an appt with Dr. Elsworth Soho in [place] to establish with him for sleep. Nothing further needed.

## 2020-07-04 ENCOUNTER — Ambulatory Visit: Payer: PPO | Admitting: *Deleted

## 2020-07-04 DIAGNOSIS — I10 Essential (primary) hypertension: Secondary | ICD-10-CM

## 2020-07-04 DIAGNOSIS — J45991 Cough variant asthma: Secondary | ICD-10-CM | POA: Diagnosis not present

## 2020-07-04 DIAGNOSIS — J449 Chronic obstructive pulmonary disease, unspecified: Secondary | ICD-10-CM

## 2020-07-08 ENCOUNTER — Telehealth: Payer: Self-pay | Admitting: Family Medicine

## 2020-07-08 MED ORDER — ALBUTEROL SULFATE 1.25 MG/3ML IN NEBU: 1.0000 | INHALATION_SOLUTION | Freq: Four times a day (QID) | RESPIRATORY_TRACT | 1 refills | Status: DC | PRN

## 2020-07-08 NOTE — Telephone Encounter (Signed)
  Prescription Request  07/08/2020  What is the name of the medication or equipment? Nebulizer solution  Have you contacted your pharmacy to request a refill? (if applicable) yes  Which pharmacy would you like this sent to? [facility], [place]  Pt says he is completely out of his nebulizer solution and has a hard time breathing. Is requesting that Dr [name] send him some refills ASAP.

## 2020-07-09 ENCOUNTER — Observation Stay (HOSPITAL_COMMUNITY)
Admission: EM | Admit: 2020-07-09 | Discharge: 2020-07-10 | Disposition: A | Discharge status: Home / Self Care | Payer: PPO | Attending: Emergency Medicine | Admitting: Emergency Medicine

## 2020-07-09 ENCOUNTER — Emergency Department (HOSPITAL_COMMUNITY): Payer: PPO

## 2020-07-09 ENCOUNTER — Encounter (HOSPITAL_COMMUNITY): Payer: Self-pay

## 2020-07-09 ENCOUNTER — Other Ambulatory Visit: Payer: Self-pay

## 2020-07-09 ENCOUNTER — Telehealth: Payer: Self-pay | Admitting: *Deleted

## 2020-07-09 DIAGNOSIS — K219 Gastro-esophageal reflux disease without esophagitis: Secondary | ICD-10-CM | POA: Diagnosis not present

## 2020-07-09 DIAGNOSIS — M109 Gout, unspecified: Secondary | ICD-10-CM | POA: Diagnosis present

## 2020-07-09 DIAGNOSIS — I10 Essential (primary) hypertension: Secondary | ICD-10-CM | POA: Diagnosis not present

## 2020-07-09 DIAGNOSIS — Z79899 Other long term (current) drug therapy: Secondary | ICD-10-CM | POA: Insufficient documentation

## 2020-07-09 DIAGNOSIS — I2510 Atherosclerotic heart disease of native coronary artery without angina pectoris: Secondary | ICD-10-CM | POA: Diagnosis not present

## 2020-07-09 DIAGNOSIS — J449 Chronic obstructive pulmonary disease, unspecified: Secondary | ICD-10-CM | POA: Diagnosis not present

## 2020-07-09 DIAGNOSIS — J45901 Unspecified asthma with (acute) exacerbation: Secondary | ICD-10-CM | POA: Diagnosis present

## 2020-07-09 DIAGNOSIS — Z20822 Contact with and (suspected) exposure to covid-19: Secondary | ICD-10-CM | POA: Diagnosis not present

## 2020-07-09 DIAGNOSIS — E119 Type 2 diabetes mellitus without complications: Secondary | ICD-10-CM

## 2020-07-09 DIAGNOSIS — N183 Chronic kidney disease, stage 3 unspecified: Secondary | ICD-10-CM | POA: Diagnosis not present

## 2020-07-09 DIAGNOSIS — N400 Enlarged prostate without lower urinary tract symptoms: Secondary | ICD-10-CM | POA: Diagnosis not present

## 2020-07-09 DIAGNOSIS — I5032 Chronic diastolic (congestive) heart failure: Secondary | ICD-10-CM | POA: Diagnosis not present

## 2020-07-09 DIAGNOSIS — Z9989 Dependence on other enabling machines and devices: Secondary | ICD-10-CM

## 2020-07-09 DIAGNOSIS — J45991 Cough variant asthma: Secondary | ICD-10-CM

## 2020-07-09 DIAGNOSIS — Z87891 Personal history of nicotine dependence: Secondary | ICD-10-CM | POA: Insufficient documentation

## 2020-07-09 DIAGNOSIS — E66813 Obesity, class 3: Secondary | ICD-10-CM | POA: Diagnosis present

## 2020-07-09 DIAGNOSIS — I129 Hypertensive chronic kidney disease with stage 1 through stage 4 chronic kidney disease, or unspecified chronic kidney disease: Secondary | ICD-10-CM | POA: Diagnosis not present

## 2020-07-09 DIAGNOSIS — R059 Cough, unspecified: Secondary | ICD-10-CM | POA: Diagnosis not present

## 2020-07-09 DIAGNOSIS — G4733 Obstructive sleep apnea (adult) (pediatric): Secondary | ICD-10-CM | POA: Diagnosis not present

## 2020-07-09 DIAGNOSIS — R0602 Shortness of breath: Secondary | ICD-10-CM | POA: Diagnosis not present

## 2020-07-09 LAB — BASIC METABOLIC PANEL
Anion gap: 9 (ref 5–15)
BUN: 28 mg/dL — ABNORMAL HIGH (ref 8–23)
CO2: 31 mmol/L (ref 22–32)
Calcium: 8.9 mg/dL (ref 8.9–10.3)
Chloride: 98 mmol/L (ref 98–111)
Creatinine, Ser: 1.14 mg/dL (ref 0.61–1.24)
GFR, Estimated: >60 mL/min (ref >60)
Glucose, Bld: 113 mg/dL — ABNORMAL HIGH (ref 70–99)
Potassium: 4.3 mmol/L (ref 3.5–5.1)
Sodium: 138 mmol/L (ref 135–145)

## 2020-07-09 LAB — CBC WITH DIFFERENTIAL/PLATELET
Abs Immature Granulocytes: 0.04 10*3/uL (ref 0.00–0.07)
Basophils Absolute: 0.1 10*3/uL (ref 0.0–0.1)
Basophils Relative: 1 %
Eosinophils Absolute: 0.5 10*3/uL (ref 0.0–0.5)
Eosinophils Relative: 6 %
HCT: 46.9 % (ref 39.0–52.0)
Hemoglobin: 15.1 g/dL (ref 13.0–17.0)
Immature Granulocytes: 1 %
Lymphocytes Relative: 22 %
Lymphs Abs: 2 10*3/uL (ref 0.7–4.0)
MCH: 29.3 pg (ref 26.0–34.0)
MCHC: 32.2 g/dL (ref 30.0–36.0)
MCV: 91.1 fL (ref 80.0–100.0)
Monocytes Absolute: 0.7 10*3/uL (ref 0.1–1.0)
Monocytes Relative: 8 %
Neutro Abs: 5.5 10*3/uL (ref 1.7–7.7)
Neutrophils Relative %: 62 %
Platelets: 185 10*3/uL (ref 150–400)
RBC: 5.15 MIL/uL (ref 4.22–5.81)
RDW: 15.3 % (ref 11.5–15.5)
WBC: 8.8 10*3/uL (ref 4.0–10.5)
nRBC: 0 % (ref 0.0–0.2)

## 2020-07-09 LAB — TROPONIN I (HIGH SENSITIVITY)
Troponin I (High Sensitivity): 5 ng/L (ref <18)
Troponin I (High Sensitivity): 5 ng/L (ref <18)

## 2020-07-09 LAB — RESP PANEL BY RT-PCR (FLU A&B, COVID) ARPGX2
Influenza A by PCR: NEGATIVE
Influenza B by PCR: NEGATIVE
SARS Coronavirus 2 by RT PCR: NEGATIVE

## 2020-07-09 MED ORDER — ALBUTEROL (5 MG/ML) CONTINUOUS INHALATION SOLN
10.0000 mg/h | INHALATION_SOLUTION | Freq: Once | RESPIRATORY_TRACT | Status: AC
  Administered 2020-07-09: 10 mg/h via RESPIRATORY_TRACT
  Filled 2020-07-09: qty 20

## 2020-07-09 MED ORDER — ACETAMINOPHEN 650 MG RE SUPP: 650.0000 mg | Freq: Four times a day (QID) | RECTAL | Status: DC | PRN

## 2020-07-09 MED ORDER — ALBUTEROL SULFATE HFA 108 (90 BASE) MCG/ACT IN AERS: 6.0000 | INHALATION_SPRAY | Freq: Once | RESPIRATORY_TRACT | Status: DC

## 2020-07-09 MED ORDER — MAGNESIUM SULFATE 2 GM/50ML IV SOLN
2.0000 g | Freq: Once | INTRAVENOUS | Status: AC
  Administered 2020-07-09: 2 g via INTRAVENOUS
  Filled 2020-07-09: qty 50

## 2020-07-09 MED ORDER — METHYLPREDNISOLONE SODIUM SUCC 125 MG IJ SOLR
125.0000 mg | Freq: Once | INTRAMUSCULAR | Status: AC
  Administered 2020-07-09: 125 mg via INTRAVENOUS
  Filled 2020-07-09: qty 2

## 2020-07-09 MED ORDER — ENOXAPARIN SODIUM 60 MG/0.6ML IJ SOSY
0.5000 mg/kg | PREFILLED_SYRINGE | INTRAMUSCULAR | Status: DC
  Administered 2020-07-09: 60 mg via SUBCUTANEOUS
  Filled 2020-07-09: qty 0.6

## 2020-07-09 MED ORDER — IPRATROPIUM BROMIDE 0.02 % IN SOLN
0.5000 mg | Freq: Once | RESPIRATORY_TRACT | Status: AC
  Administered 2020-07-09: 0.5 mg via RESPIRATORY_TRACT
  Filled 2020-07-09: qty 2.5

## 2020-07-09 MED ORDER — ACETAMINOPHEN 325 MG PO TABS: 650.0000 mg | ORAL_TABLET | Freq: Four times a day (QID) | ORAL | Status: DC | PRN

## 2020-07-09 NOTE — Plan of Care (Signed)

## 2020-07-09 NOTE — H&P (Signed)
History and Physical  LEIBY PIGEON LNL:[id number] DOB: 11-14-1945 DOA: 07/09/2020  Referring physician: Rodney Booze, PA-C PCP: [name], [name], MD  Patient coming from: Home  Chief Complaint: Shortness of breath and cough  HPI: Kenneth Fuller is a 75 y.o. male with medical history significant for chronic diastolic CHF, obesity, OSA, hyperlipidemia, diabetes mellitus, neuropathy, minor CAD, moderately severe restrictive lung disease, most recent cardiac cath in October 2018 showed a maximal stenosis of 10% in the mid LAD, asthma, hypertension, GERD, BPH who presents to the emergency department due to 4 days onset of shortness of breath associated with cough and wheezing.  Patient complained of chest soreness from frequent coughing.  He states that he mowed his lawn on Saturday (5/28) and that symptoms started shortly after this.  Patient states that he recently ran out of his nebulizer solution he denies fever, chills and patient does not use oxygen at baseline.  Patient states that he quit smoking since 1971.  ED Course:  In the emergency department, he was hemodynamically stable.  Work-up in the ED showed normal CBC and BMP except for elevated BUN at 28.  Troponin x2 was flat at 5.  Influenza A, B and SARS coronavirus 2 was negative. Chest x-ray showed no acute disease and EKG showed normal sinus rhythm at a rate of 67 bpm Breathing treatment was provided, Solu-Medrol was given.  Hospitalist was asked to admit patient for further evaluation and management.  Review of Systems: Constitutional: Negative for chills and fever.  HENT: Negative for ear pain and sore throat.   Eyes: Negative for pain and visual disturbance.  Respiratory: Positive for cough,  wheezing and shortness of breath.   Cardiovascular: Positive for chest soreness from coughing and negative for palpitations.  Gastrointestinal: Negative for abdominal pain and vomiting.  Endocrine: Negative for polyphagia and  polyuria.  Genitourinary: Negative for decreased urine volume, dysuria, enuresis Musculoskeletal: Negative for arthralgias and back pain.  Skin: Negative for color change and rash.  Allergic/Immunologic: Negative for immunocompromised state.  Neurological: Negative for tremors, syncope, speech difficulty, weakness, light-headedness and headaches.  Hematological: Does not bruise/bleed easily.  All other systems reviewed and are negative   Past Medical History:  Diagnosis Date  . Allergy   . Asthma   . CAD (coronary artery disease)   . CHF (congestive heart failure) ([place])   . Diabetes mellitus   . Fibromyalgia   . GERD (gastroesophageal reflux disease)   . GI bleeding   . Gout   . Hyperlipidemia   . Hypertension   . Hypogonadism male   . Insomnia   . MRSA cellulitis   . Neuropathy   . Obesity   . Shortness of breath dyspnea    with exertion   . Sleep apnea    cpap- 14   . Wheezing    no asthma diagnosis   Past Surgical History:  Procedure Laterality Date  . BACK SURGERY    . BIOPSY  12/20/2019   Procedure: BIOPSY;  Surgeon: Milus Banister, MD;  Location: [facility];  Service: Endoscopy;;  . CARDIAC CATHETERIZATION    . COLONOSCOPY    . COLONOSCOPY WITH PROPOFOL N/A 12/20/2019   Procedure: COLONOSCOPY WITH PROPOFOL;  Surgeon: Milus Banister, MD;  Location: [facility];  Service: Endoscopy;  Laterality: N/A;  . ESOPHAGOGASTRODUODENOSCOPY (EGD) WITH PROPOFOL N/A 12/20/2019   Procedure: ESOPHAGOGASTRODUODENOSCOPY (EGD) WITH PROPOFOL;  Surgeon: Milus Banister, MD;  Location: [facility];  Service: Endoscopy;  Laterality: N/A;  .  LEFT HEART CATH AND CORONARY ANGIOGRAPHY N/A 12/02/2016   Procedure: LEFT HEART CATH AND CORONARY ANGIOGRAPHY;  Surgeon: Jettie Booze, MD;  Location: [facility];  Service: Cardiovascular;  Laterality: N/A;  . LUMBAR LAMINECTOMY/DECOMPRESSION MICRODISCECTOMY N/A 01/02/2014   Procedure: CENTRAL DECOMPRESSION LUMBAR LAMINECTOMY  L3-L4, L4-L5;  Surgeon: Tobi Bastos, MD;  Location: [facility];  Service: Orthopedics;  Laterality: N/A;  . neck fusion    . POLYPECTOMY  12/20/2019   Procedure: POLYPECTOMY;  Surgeon: Milus Banister, MD;  Location: [facility];  Service: Endoscopy;;    Social History:  reports that he quit smoking about 51 years ago. He quit smokeless tobacco use about 51 years ago. He reports that he does not drink alcohol and does not use drugs.   Allergies  Allergen Reactions  . Amlodipine Besy-Benazepril Hcl Swelling and Other (See Comments)    Makes tongue swell (lotrel)  . Oxycodone Itching  . Phenergan [Promethazine Hcl] Other (See Comments)    "I can't remember."  . Hydrocodone Itching    Can tolerate in low doses    Family History  Problem Relation Age of Onset  . Colon cancer Mother   . Diabetes Father        siblings  . Heart disease Father        brother  . Heart attack Father   . Kidney disease Sister   . Heart failure Sister   . Heart disease Brother   . Heart attack Son 74  . Drug abuse Sister   . Heart disease Brother   . Deep vein thrombosis Brother   . Colon polyps Neg Hx     Prior to Admission medications   Medication Sig Start Date End Date Taking? Authorizing Provider  albuterol (ACCUNEB) 1.25 MG/3ML nebulizer solution Take 3 mLs (1.25 mg total) by nebulization every 6 (six) hours as needed for wheezing. 07/08/20   Chevis Pretty, FNP  albuterol (VENTOLIN HFA) 108 (90 Base) MCG/ACT inhaler Inhale 2 puffs into the lungs every 6 (six) hours as needed for wheezing or shortness of breath. 05/05/20   Claretta Fraise, MD  atorvastatin (LIPITOR) 40 MG tablet Take 40 mg by mouth daily. 1 Tablet Daily    [provider]  azelastine (OPTIVAR) 0.05 % ophthalmic solution Place 1 drop into both eyes in the morning and at bedtime. Patient not taking: Reported on 06/06/2020 12/07/19   [provider]  bisoprolol (ZEBETA) 5 MG tablet Take 1/2 (one-half)  tablet by mouth once daily Patient taking differently: Take 2.5 mg by mouth daily. 06/04/20   [name], [name], MD  Budeson-Glycopyrrol-Formoterol (BREZTRI AEROSPHERE) 160-9-4.8 MCG/ACT AERO Inhale 2 puffs into the lungs in the morning and at bedtime. 01/23/20   [name], [name], MD  Cholecalciferol (VITAMIN D3) 125 MCG (5000 UT) TABS Take 5,000 Units by mouth daily.    [provider]  cloNIDine (CATAPRES) 0.2 MG tablet Take 1 tablet (0.2 mg total) by mouth 3 (three) times daily. (NEEDS TO BE SEEN BEFORE NEXT REFILL) 06/20/20   [name], [name], MD  colchicine 0.6 MG tablet Take 0.6 mg by mouth daily. As needed    [provider]  dapagliflozin propanediol (FARXIGA) 5 MG TABS tablet Take 1 tablet (5 mg total) by mouth daily. 01/02/20   [name], [name], MD  famotidine (PEPCID) 20 MG tablet Take 1 tablet (20 mg total) by mouth at bedtime. 05/07/20   Martyn Ehrich, NP  FEROSUL 325 (65 Fe) MG tablet Take  325 mg by mouth 3 (three) times daily.  08/15/19   [provider]  Ferrous Sulfate Dried (HIGH POTENCY IRON) 65 MG TABS TAKE 1 TABLET BY MOUTH THREE TIMES DAILY WITH MEALS. DO NOT CRUSH, CHEW OR SPIT Patient not taking: Reported on 06/06/2020 05/14/20   [provider]  furosemide (LASIX) 40 MG tablet Take 40 mg by mouth daily as needed (fluid retention.).    [provider]  gabapentin (NEURONTIN) 100 MG capsule Take 1 capsule (100 mg total) by mouth 3 (three) times daily. Patient not taking: No sig reported 05/07/20   Martyn Ehrich, NP  hydrALAZINE (APRESOLINE) 50 MG tablet Take 1 tablet (50 mg total) by mouth 3 (three) times daily. 06/24/20   Croitoru, Mihai, MD  isosorbide dinitrate (ISORDIL) 20 MG tablet Take 1 tablet (20 mg total) by mouth 3 (three) times daily. 06/07/20 07/07/20  Manuella Ghazi, Pratik D, DO  MYRBETRIQ 25 MG TB24 tablet Take 1 tablet by mouth once daily 07/02/20   [name], [name], MD  nitroGLYCERIN (NITROSTAT) 0.4 MG SL tablet  Place 1 tablet (0.4 mg total) under the tongue every 5 (five) minutes x 3 doses as needed for chest pain. 06/07/20 07/07/20  Manuella Ghazi, Pratik D, DO  omeprazole (PRILOSEC) 40 MG capsule Take 1 capsule (40 mg total) by mouth 2 (two) times daily at 8 am and 10 pm. Take 30-60 minutes prior to breakfast. 05/13/20   Ladene Artist, MD  polyethylene glycol (MIRALAX / GLYCOLAX) 17 g packet Take 17 g by mouth daily as needed for mild constipation.    [provider]  prednisoLONE acetate (PRED FORTE) 1 % ophthalmic suspension Place 1 drop into both eyes in the morning and at bedtime. Patient not taking: Reported on 06/06/2020 12/07/19   [provider]  RESTASIS 0.05 % ophthalmic emulsion Place 1 drop into both eyes in the morning and at bedtime. 12/07/19   [provider]  sertraline (ZOLOFT) 100 MG tablet Take 1 tablet (100 mg total) by mouth daily. (NEEDS TO BE SEEN BEFORE NEXT REFILL) 06/20/20   [name], [name], MD  tamsulosin (FLOMAX) 0.4 MG CAPS capsule Take 1 capsule (0.4 mg total) by mouth daily after supper. 11/22/19   [name], [name], MD    Physical Exam: BP 121/80   Pulse 95   Temp 98.1 F (36.7 C) (Oral)   Resp 20   Ht _0  (1.702 m)   Wt 120.4 kg   SpO2 94%   BMI 41.57 kg/m   . General: 75 y.o. year-old male well developed well nourished in no acute distress.  Alert and oriented x3. [name] HEENT: NCAT, EOMI . Neck: Supple, trachea medial . Cardiovascular: Regular rate and rhythm with no rubs or gallops.  No thyromegaly or JVD noted.  No lower extremity edema. 2/4 pulses in all 4 extremities. [name] Respiratory: Diffuse expiratory wheezing on auscultation.  No rales. . Abdomen: Soft, nontender nondistended with normal bowel sounds x4 quadrants. . Muskuloskeletal: No cyanosis, clubbing or edema noted bilaterally . Neuro: CN II-XII intact, strength 5/5 x 4, sensation, reflexes intact . Skin: No ulcerative lesions noted or rashes . Psychiatry: Judgement and insight  appear normal. Mood is appropriate for condition and setting          Labs on Admission:  Basic Metabolic Panel: Recent Labs  Lab 07/09/20 1719  NA 138  K 4.3  CL 98  CO2 31  GLUCOSE 113*  BUN 28*  CREATININE 1.14  CALCIUM 8.9  Liver Function Tests: No results for input(s): AST, ALT, ALKPHOS, BILITOT, PROT, ALBUMIN in the last 168 hours. No results for input(s): LIPASE, AMYLASE in the last 168 hours. No results for input(s): AMMONIA in the last 168 hours. CBC: Recent Labs  Lab 07/09/20 1719  WBC 8.8  NEUTROABS 5.5  HGB 15.1  HCT 46.9  MCV 91.1  PLT 185   Cardiac Enzymes: No results for input(s): CKTOTAL, CKMB, CKMBINDEX, TROPONINI in the last 168 hours.  BNP (last 3 results) Recent Labs    06/06/20 1500  BNP 279.0*    ProBNP (last 3 results) Recent Labs    05/07/20 1226  PROBNP 219.0*    CBG: No results for input(s): GLUCAP in the last 168 hours.  Radiological Exams on Admission: DG Chest 2 View  Result Date: 07/09/2020 CLINICAL DATA:  Shortness of breath and cough. EXAM: CHEST - 2 VIEW COMPARISON:  PA and lateral chest 06/06/2020.  CT chest 05/19/2020. FINDINGS: Lungs clear. Heart size normal. No pneumothorax or pleural fluid. No acute or focal bony abnormality. IMPRESSION: No acute disease. Electronically Signed   By: Inge Rise M.D.   On: 07/09/2020 14:32    EKG: I independently viewed the EKG done and my findings are as followed: Normal sinus rhythm at a rate of 67 bpm   Assessment/Plan Present on Admission: . Gout . Essential hypertension . GERD (gastroesophageal reflux disease) . Obesity, Class III, BMI 40-49.9 (morbid obesity) ([place]) . Coronary artery disease involving native coronary artery of native heart without angina pectoris . Asthma exacerbation . Chronic diastolic heart failure (HCC)  Active Problems:   Diabetes mellitus type II, non insulin dependent (HCC)   Gout   Essential hypertension   OSA on CPAP   GERD  (gastroesophageal reflux disease)   Coronary artery disease involving native coronary artery of native heart without angina pectoris   Obesity, Class III, BMI 40-49.9 (morbid obesity) (HCC)   Chronic diastolic heart failure (HCC)   Asthma exacerbation   Acute on chronic COPD with concomitant asthma exacerbation Continue duo nebs, Mucinex, Solu-Medrol, azithromycin. Continue Protonix to prevent steroid-induced ulcer Continue incentive spirometry and flutter valve  Hyperlipidemia Continue Lipitor  Essential hypertension Continue hydralazine, Isordil  History of CAD Troponin x2 was flat at 5, patient only complained of chest soreness from coughing ECG showed normal sinus rhythm at a rate of 67 bpm Continue Lipitor, Isordil, Nitrostat  GERD Continue Protonix  T2DM Continue ISS and hypoglycemic protocol  Chronic diastolic CHF Patient appears euvolemic  BPH Continue Flomax  OSA Continue on BiPAP per home regimen  Gout Continue colchicine  Obesity (BMI 41.57) Patient counseled on diet and lifestyle modification   DVT prophylaxis: Lovenox  Code Status: Full code  Family Communication: None at bedside  Disposition Plan:  Patient is from:                        home Anticipated DC to:                   home Anticipated DC date:               1 day Anticipated DC barriers:           Patient requires inpatient management due to acute aspiration of COPD with concomitant asthma exacerbation  Consults called: None  Admission status: Observation    Bernadette Hoit MD [facility]  07/10/2020, 12:21 AM

## 2020-07-09 NOTE — Chronic Care Management (AMB) (Signed)
Chronic Care Management   CCM RN Visit Note  07/04/2020 Name: Kenneth Fuller MRN: [id number] DOB: 01/12/1946  Subjective: Kenneth Fuller is a 75 y.o. year old male who is a primary care patient of [name], [name], MD. The care management team was consulted for assistance with disease management and care coordination needs.    Engaged with patient by telephone for follow up visit in response to provider referral for case management and/or care coordination services.   Consent to Services:  The patient was given information about Chronic Care Management services, agreed to services, and gave verbal consent prior to initiation of services.  Please see initial visit note for detailed documentation.   Patient agreed to services and verbal consent obtained.   Assessment: Review of patient past medical history, allergies, medications, health status, including review of consultants reports, laboratory and other test data, was performed as part of comprehensive evaluation and provision of chronic care management services.   SDOH (Social Determinants of Health) assessments and interventions performed:    CCM Care Plan  Allergies  Allergen Reactions  . Amlodipine Besy-Benazepril Hcl Swelling and Other (See Comments)    Makes tongue swell (lotrel)  . Oxycodone Itching  . Phenergan [Promethazine Hcl] Other (See Comments)    "I can't remember."  . Hydrocodone Itching    Can tolerate in low doses    Outpatient Encounter Medications as of 07/04/2020  Medication Sig  . albuterol (VENTOLIN HFA) 108 (90 Base) MCG/ACT inhaler Inhale 2 puffs into the lungs every 6 (six) hours as needed for wheezing or shortness of breath.  [name] atorvastatin (LIPITOR) 40 MG tablet Take 40 mg by mouth daily. 1 Tablet Daily  . azelastine (OPTIVAR) 0.05 % ophthalmic solution Place 1 drop into both eyes in the morning and at bedtime. (Patient not taking: Reported on 06/06/2020)  . bisoprolol (ZEBETA) 5 MG tablet  Take 1/2 (one-half) tablet by mouth once daily (Patient taking differently: Take 2.5 mg by mouth daily.)  . Budeson-Glycopyrrol-Formoterol (BREZTRI AEROSPHERE) 160-9-4.8 MCG/ACT AERO Inhale 2 puffs into the lungs in the morning and at bedtime.  . Cholecalciferol (VITAMIN D3) 125 MCG (5000 UT) TABS Take 5,000 Units by mouth daily.  . cloNIDine (CATAPRES) 0.2 MG tablet Take 1 tablet (0.2 mg total) by mouth 3 (three) times daily. (NEEDS TO BE SEEN BEFORE NEXT REFILL)  . colchicine 0.6 MG tablet Take 0.6 mg by mouth daily. As needed  . dapagliflozin propanediol (FARXIGA) 5 MG TABS tablet Take 1 tablet (5 mg total) by mouth daily.  . famotidine (PEPCID) 20 MG tablet Take 1 tablet (20 mg total) by mouth at bedtime.  . FEROSUL 325 (65 Fe) MG tablet Take 325 mg by mouth 3 (three) times daily.   . Ferrous Sulfate Dried (HIGH POTENCY IRON) 65 MG TABS TAKE 1 TABLET BY MOUTH THREE TIMES DAILY WITH MEALS. DO NOT CRUSH, CHEW OR SPIT (Patient not taking: Reported on 06/06/2020)  . furosemide (LASIX) 40 MG tablet Take 40 mg by mouth daily as needed (fluid retention.).  [name] gabapentin (NEURONTIN) 100 MG capsule Take 1 capsule (100 mg total) by mouth 3 (three) times daily. (Patient not taking: No sig reported)  . hydrALAZINE (APRESOLINE) 50 MG tablet Take 1 tablet (50 mg total) by mouth 3 (three) times daily.  . isosorbide dinitrate (ISORDIL) 20 MG tablet Take 1 tablet (20 mg total) by mouth 3 (three) times daily.  [name] MYRBETRIQ 25 MG TB24 tablet Take 1 tablet by mouth once daily  .  nitroGLYCERIN (NITROSTAT) 0.4 MG SL tablet Place 1 tablet (0.4 mg total) under the tongue every 5 (five) minutes x 3 doses as needed for chest pain.  [name] omeprazole (PRILOSEC) 40 MG capsule Take 1 capsule (40 mg total) by mouth 2 (two) times daily at 8 am and 10 pm. Take 30-60 minutes prior to breakfast.  . polyethylene glycol (MIRALAX / GLYCOLAX) 17 g packet Take 17 g by mouth daily as needed for mild constipation.  . prednisoLONE acetate (PRED  FORTE) 1 % ophthalmic suspension Place 1 drop into both eyes in the morning and at bedtime. (Patient not taking: Reported on 06/06/2020)  . RESTASIS 0.05 % ophthalmic emulsion Place 1 drop into both eyes in the morning and at bedtime.  . sertraline (ZOLOFT) 100 MG tablet Take 1 tablet (100 mg total) by mouth daily. (NEEDS TO BE SEEN BEFORE NEXT REFILL)  . tamsulosin (FLOMAX) 0.4 MG CAPS capsule Take 1 capsule (0.4 mg total) by mouth daily after supper.  . [DISCONTINUED] albuterol (ACCUNEB) 1.25 MG/3ML nebulizer solution Take 1 ampule by nebulization every 6 (six) hours as needed for wheezing.   No facility-administered encounter medications on file as of 07/04/2020.    Patient Active Problem List   Diagnosis Date Noted  . Chest pain 06/06/2020  . Right upper lobe pneumonia 05/07/2020  . Irritable larynx 05/07/2020  . AKI (acute kidney injury) ([place]) 06/05/2019  . Hyperkalemia 06/05/2019  . Acute renal failure (ARF) ([place]) 06/04/2019  . Dizziness 06/04/2019  . Near syncope 06/04/2019  . Insomnia   . CHF (congestive heart failure) ([place]) 01/06/2018  . Depression with anxiety 01/04/2018  . CKD (chronic kidney disease), stage III ([place]) 01/04/2018  . Chronic obstructive pulmonary disease ([place]) 01/04/2018  . Depression, recurrent ([place]) 11/11/2017  . Chronic diastolic heart failure ([place]) 04/13/2017  . Swelling of lower extremity 11/30/2016  . Obesity, Class III, BMI 40-49.9 (morbid obesity) ([place]) 08/21/2015  . Coronary artery disease involving native coronary artery of native heart without angina pectoris 12/05/2014  . Urinary urgency 10/15/2014  . Spinal stenosis, lumbar region, with neurogenic claudication 01/02/2014  . Hyperlipidemia associated with type 2 diabetes mellitus ([place]) 10/25/2013  . COLONIC POLYPS, ADENOMATOUS 02/25/2007  . Diabetes mellitus type II, non insulin dependent ([place]) 02/25/2007  . Gout 02/25/2007  . Essential hypertension 02/25/2007  . Cough variant asthma 02/25/2007   . OSA on CPAP 02/25/2007  . FATIGUE, CHRONIC 02/25/2007  . HEADACHE, CHRONIC 02/25/2007  . GERD (gastroesophageal reflux disease) 02/25/2007    Conditions to be addressed/monitored:HTN, DMII and Pulmonary Disease  Care Plan : RNCM: Hypertension (Adult)  Updates made by Ilean China, RN since 07/09/2020 12:00 AM    Problem: Hypertension (Hypertension)   Priority: Medium    Long-Range Goal: Hypertension Managed   Start Date: 05/14/2020  This Visit's Progress: Not on track  Recent Progress: On track  Priority: Medium  Note:   Objective:  BP Readings from Last 6 Encounters:  06/07/20 (!) 164/82  06/06/20 (!) 157/86  05/22/20 126/74  05/19/20 132/77   Current Barriers:  [name] Knowledge Deficits related to uncontrolled hypertension . Chronic Disease Management support and education needs related to hypertension  Case Manager Clinical Goal(s):  . patient will demonstrate improved adherence to prescribed treatment plan for hypertension as evidenced by taking all medications as prescribed, monitoring and recording blood pressure as directed, adhering to low sodium/DASH diet . patient will demonstrate improved health management independence as evidenced by checking blood pressure as directed and notifying PCP if  SBP>160 or DBP > 90, taking all medications as prescribe, and adhering to a low sodium diet as discussed.  Interventions:  . Collaboration with [name], [name], MD regarding development and update of comprehensive plan of care as evidenced by provider attestation and co-signature . Inter-disciplinary care team collaboration (see longitudinal plan of care) . Evaluation of current treatment plan related to hypertension self management and patient's adherence to plan as established by provider. . Reviewed medications with patient and verified compliance . Discussed plans with patient for ongoing care management follow up and provided patient with direct contact information for  care management team . Advised patient, providing education and rationale, to monitor blood pressure daily and record, calling PCP for findings outside established parameters.  . Discussed recent home blood pressure readings o Persistently elevated with readings up to 180/110 o Averaging around 150/90 . RNCM collaboration with PCP regarding persistent elevation . Encouraged patient to reach out to PCP with any new or worsening symptoms or to seek emergency medical attention as necessary . Encouraged patient to reach out to [facility] as needed  Self-Care Activities: . Self administers medications as prescribed . Attends all scheduled provider appointments . Calls provider office for new concerns, questions, or BP outside discussed parameters . Checks BP and records as discussed  Patient Goals: . check blood pressure daily . write blood pressure results in a log or diary . ask questions to understand . Keep appointment with cardiologist . Call PCP with any readings outside of recommended range . Call [facility] with any nursing needs [phone]  Follow Up Plan:  . Telephone follow up appointment with care management team member scheduled for: 08/05/20 with RNCM . The patient has been provided with contact information for the care management team and has been advised to call with any health related questions or concerns.      Follow Up Plan:  . Telephone follow up appointment with care management team member scheduled for: 08/05/20 with RNCM . The patient has been provided with contact information for the care management team and has been advised to call with any health related questions or concerns.   [name], BSN, RN-BC Embedded Chronic Care Manager [facility] / [place] [facility] Direct Dial: [phone]

## 2020-07-09 NOTE — ED Triage Notes (Signed)
Pt to er, pt states that he is here for a cough and shortness of breath since Saturday.  States that he has a hx of breathing issues.  Pt states that he also has some chest pain, states that it is worse with activity.

## 2020-07-09 NOTE — Telephone Encounter (Signed)
Albuterol Sulfate 1.25MG/3ML nebulizer solution PA started Key: [id number] Sent to plan as urgent

## 2020-07-09 NOTE — Patient Instructions (Signed)
Visit Information  PATIENT GOALS: Goals Addressed            This Visit's Progress   . Track and Manage My Blood Pressure-Hypertension   Not on track    Timeframe:  Long-Range Goal Priority:  Medium Start Date:   05/14/20                          Expected End Date:   05/14/21                      Follow Up Date 08/05/20   . check blood pressure daily . write blood pressure results in a log or diary . ask questions to understand . Call PCP with any readings outside of recommended range . Call RN Care Manager with any nursing needs [phone]    Why is this important?    You won't feel high blood pressure, but it can still hurt your blood vessels.   High blood pressure can cause heart or kidney problems. It can also cause a stroke.   Making lifestyle changes like losing a little weight or eating less salt will help.   Checking your blood pressure at home and at different times of the day can help to control blood pressure.   If the doctor prescribes medicine remember to take it the way the doctor ordered.   Call the office if you cannot afford the medicine or if there are questions about it.     Notes:        Patient verbalizes understanding of instructions provided today and agrees to view in [place].   Follow Up Plan:  . Telephone follow up appointment with care management team member scheduled for: 08/05/20 with RNCM . The patient has been provided with contact information for the care management team and has been advised to call with any health related questions or concerns.   [name], BSN, RN-BC Embedded Chronic Care Manager [facility] [place] [facility] Direct Dial: [phone]

## 2020-07-09 NOTE — Telephone Encounter (Signed)
Denied today PA Case: [id number], Status: Denied. Notification: Completed  Pt is at the ER right now.  FYI - in the case we can send in something else.

## 2020-07-09 NOTE — ED Provider Notes (Signed)
[facility] Provider Note   CSN: [id number] Arrival date & time: 07/09/20  1351     History Chief Complaint  Patient presents with  . Cough  . Shortness of Breath    Kenneth Fuller is a 75 y.o. male.  HPI   Pt is a 75 y/o male with a h/o allergies, asthma, CAD, CHF, DM, fibromyalgia, GERD, GI bleeding, gout, HLD, HTN, MRSA, neuropathy, obesity, sleep apnea, who presents to the ED today for eval of sob that started 4-5 days ago. Reports increased wheezing, cough. He reports some chest pain as well. He recently ran out of his nebulizer solution. No fevers. Does not currently use oxygen at home.   Past Medical History:  Diagnosis Date  . Allergy   . Asthma   . CAD (coronary artery disease)   . CHF (congestive heart failure) ([place])   . Diabetes mellitus   . Fibromyalgia   . GERD (gastroesophageal reflux disease)   . GI bleeding   . Gout   . Hyperlipidemia   . Hypertension   . Hypogonadism male   . Insomnia   . MRSA cellulitis   . Neuropathy   . Obesity   . Shortness of breath dyspnea    with exertion   . Sleep apnea    cpap- 14   . Wheezing    no asthma diagnosis    Patient Active Problem List   Diagnosis Date Noted  . Asthma exacerbation 07/09/2020  . Chest pain 06/06/2020  . Right upper lobe pneumonia 05/07/2020  . Irritable larynx 05/07/2020  . AKI (acute kidney injury) ([place]) 06/05/2019  . Hyperkalemia 06/05/2019  . Acute renal failure (ARF) ([place]) 06/04/2019  . Dizziness 06/04/2019  . Near syncope 06/04/2019  . Insomnia   . CHF (congestive heart failure) ([place]) 01/06/2018  . Depression with anxiety 01/04/2018  . CKD (chronic kidney disease), stage III ([place]) 01/04/2018  . Chronic obstructive pulmonary disease ([place]) 01/04/2018  . Depression, recurrent ([place]) 11/11/2017  . Chronic diastolic heart failure ([place]) 04/13/2017  . Swelling of lower extremity 11/30/2016  . Obesity, Class III, BMI 40-49.9 (morbid obesity) ([place]) 08/21/2015  .  Coronary artery disease involving native coronary artery of native heart without angina pectoris 12/05/2014  . Urinary urgency 10/15/2014  . Spinal stenosis, lumbar region, with neurogenic claudication 01/02/2014  . Hyperlipidemia associated with type 2 diabetes mellitus ([place]) 10/25/2013  . COLONIC POLYPS, ADENOMATOUS 02/25/2007  . Diabetes mellitus type II, non insulin dependent ([place]) 02/25/2007  . Gout 02/25/2007  . Essential hypertension 02/25/2007  . Cough variant asthma 02/25/2007  . OSA on CPAP 02/25/2007  . FATIGUE, CHRONIC 02/25/2007  . HEADACHE, CHRONIC 02/25/2007  . GERD (gastroesophageal reflux disease) 02/25/2007    Past Surgical History:  Procedure Laterality Date  . BACK SURGERY    . BIOPSY  12/20/2019   Procedure: BIOPSY;  Surgeon: Milus Banister, MD;  Location: [facility];  Service: Endoscopy;;  . CARDIAC CATHETERIZATION    . COLONOSCOPY    . COLONOSCOPY WITH PROPOFOL N/A 12/20/2019   Procedure: COLONOSCOPY WITH PROPOFOL;  Surgeon: Milus Banister, MD;  Location: [facility];  Service: Endoscopy;  Laterality: N/A;  . ESOPHAGOGASTRODUODENOSCOPY (EGD) WITH PROPOFOL N/A 12/20/2019   Procedure: ESOPHAGOGASTRODUODENOSCOPY (EGD) WITH PROPOFOL;  Surgeon: Milus Banister, MD;  Location: [facility];  Service: Endoscopy;  Laterality: N/A;  . LEFT HEART CATH AND CORONARY ANGIOGRAPHY N/A 12/02/2016   Procedure: LEFT HEART CATH AND CORONARY ANGIOGRAPHY;  Surgeon: Jettie Booze, MD;  Location: [facility];  Service: Cardiovascular;  Laterality: N/A;  . LUMBAR LAMINECTOMY/DECOMPRESSION MICRODISCECTOMY N/A 01/02/2014   Procedure: CENTRAL DECOMPRESSION LUMBAR LAMINECTOMY L3-L4, L4-L5;  Surgeon: Tobi Bastos, MD;  Location: [facility];  Service: Orthopedics;  Laterality: N/A;  . neck fusion    . POLYPECTOMY  12/20/2019   Procedure: POLYPECTOMY;  Surgeon: Milus Banister, MD;  Location: [facility];  Service: Endoscopy;;       Family History  Problem  Relation Age of Onset  . Colon cancer Mother   . Diabetes Father        siblings  . Heart disease Father        brother  . Heart attack Father   . Kidney disease Sister   . Heart failure Sister   . Heart disease Brother   . Heart attack Son 58  . Drug abuse Sister   . Heart disease Brother   . Deep vein thrombosis Brother   . Colon polyps Neg Hx     Social History   Tobacco Use  . Smoking status: Former Smoker    Quit date: 02/08/1969    Years since quitting: 51.4  . Smokeless tobacco: Former Systems developer    Quit date: Pharmacologist  . Vaping Use: Never used  Substance Use Topics  . Alcohol use: No    Alcohol/week: 0.0 standard drinks  . Drug use: No    Home Medications Prior to Admission medications   Medication Sig Start Date End Date Taking? Authorizing Provider  albuterol (ACCUNEB) 1.25 MG/3ML nebulizer solution Take 3 mLs (1.25 mg total) by nebulization every 6 (six) hours as needed for wheezing. 07/08/20   Chevis Pretty, FNP  albuterol (VENTOLIN HFA) 108 (90 Base) MCG/ACT inhaler Inhale 2 puffs into the lungs every 6 (six) hours as needed for wheezing or shortness of breath. 05/05/20   Claretta Fraise, MD  atorvastatin (LIPITOR) 40 MG tablet Take 40 mg by mouth daily. 1 Tablet Daily    [provider]  azelastine (OPTIVAR) 0.05 % ophthalmic solution Place 1 drop into both eyes in the morning and at bedtime. Patient not taking: Reported on 06/06/2020 12/07/19   [provider]  bisoprolol (ZEBETA) 5 MG tablet Take 1/2 (one-half) tablet by mouth once daily Patient taking differently: Take 2.5 mg by mouth daily. 06/04/20   [name], [name], MD  Budeson-Glycopyrrol-Formoterol (BREZTRI AEROSPHERE) 160-9-4.8 MCG/ACT AERO Inhale 2 puffs into the lungs in the morning and at bedtime. 01/23/20   [name], [name], MD  Cholecalciferol (VITAMIN D3) 125 MCG (5000 UT) TABS Take 5,000 Units by mouth daily.    [provider]  cloNIDine (CATAPRES) 0.2 MG  tablet Take 1 tablet (0.2 mg total) by mouth 3 (three) times daily. (NEEDS TO BE SEEN BEFORE NEXT REFILL) 06/20/20   [name], [name], MD  colchicine 0.6 MG tablet Take 0.6 mg by mouth daily. As needed    [provider]  dapagliflozin propanediol (FARXIGA) 5 MG TABS tablet Take 1 tablet (5 mg total) by mouth daily. 01/02/20   [name], [name], MD  famotidine (PEPCID) 20 MG tablet Take 1 tablet (20 mg total) by mouth at bedtime. 05/07/20   Martyn Ehrich, NP  FEROSUL 325 (65 Fe) MG tablet Take 325 mg by mouth 3 (three) times daily.  08/15/19   [provider]  Ferrous Sulfate Dried (HIGH POTENCY IRON) 65 MG TABS TAKE 1 TABLET BY MOUTH THREE TIMES DAILY WITH MEALS. DO NOT CRUSH, CHEW  OR SPIT Patient not taking: Reported on 06/06/2020 05/14/20   [provider]  furosemide (LASIX) 40 MG tablet Take 40 mg by mouth daily as needed (fluid retention.).    [provider]  gabapentin (NEURONTIN) 100 MG capsule Take 1 capsule (100 mg total) by mouth 3 (three) times daily. Patient not taking: No sig reported 05/07/20   Martyn Ehrich, NP  hydrALAZINE (APRESOLINE) 50 MG tablet Take 1 tablet (50 mg total) by mouth 3 (three) times daily. 06/24/20   Croitoru, Mihai, MD  isosorbide dinitrate (ISORDIL) 20 MG tablet Take 1 tablet (20 mg total) by mouth 3 (three) times daily. 06/07/20 07/07/20  Manuella Ghazi, Pratik D, DO  MYRBETRIQ 25 MG TB24 tablet Take 1 tablet by mouth once daily 07/02/20   [name], [name], MD  nitroGLYCERIN (NITROSTAT) 0.4 MG SL tablet Place 1 tablet (0.4 mg total) under the tongue every 5 (five) minutes x 3 doses as needed for chest pain. 06/07/20 07/07/20  Manuella Ghazi, Pratik D, DO  omeprazole (PRILOSEC) 40 MG capsule Take 1 capsule (40 mg total) by mouth 2 (two) times daily at 8 am and 10 pm. Take 30-60 minutes prior to breakfast. 05/13/20   Ladene Artist, MD  polyethylene glycol (MIRALAX / GLYCOLAX) 17 g packet Take 17 g by mouth daily as needed for mild constipation.     [provider]  prednisoLONE acetate (PRED FORTE) 1 % ophthalmic suspension Place 1 drop into both eyes in the morning and at bedtime. Patient not taking: Reported on 06/06/2020 12/07/19   [provider]  RESTASIS 0.05 % ophthalmic emulsion Place 1 drop into both eyes in the morning and at bedtime. 12/07/19   [provider]  sertraline (ZOLOFT) 100 MG tablet Take 1 tablet (100 mg total) by mouth daily. (NEEDS TO BE SEEN BEFORE NEXT REFILL) 06/20/20   [name], [name], MD  tamsulosin (FLOMAX) 0.4 MG CAPS capsule Take 1 capsule (0.4 mg total) by mouth daily after supper. 11/22/19   [name], [name], MD    Allergies    Amlodipine besy-benazepril hcl, Oxycodone, Phenergan [promethazine hcl], and Hydrocodone  Review of Systems   Review of Systems  Constitutional: Negative for chills and fever.  HENT: Negative for ear pain and sore throat.   Eyes: Negative for visual disturbance.  Respiratory: Positive for cough, shortness of breath and wheezing.   Cardiovascular: Positive for chest pain. Negative for palpitations and leg swelling.  Gastrointestinal: Negative for abdominal pain, constipation, diarrhea, nausea and vomiting.  Genitourinary: Negative for dysuria and hematuria.  Musculoskeletal: Negative for back pain.  Skin: Negative for color change and rash.  Neurological: Negative for seizures and syncope.  All other systems reviewed and are negative.   Physical Exam Updated Vital Signs BP (!) 157/92 (BP Location: Right Arm)   Pulse 75   Temp 97.8 F (36.6 C) (Oral)   Resp 16   Ht _0  (1.702 m)   Wt 117.9 kg   SpO2 100%   BMI 40.72 kg/m   Physical Exam Vitals and nursing note reviewed.  Constitutional:      Appearance: He is well-developed.  HENT:     Head: Normocephalic and atraumatic.  Eyes:     Conjunctiva/sclera: Conjunctivae normal.  Cardiovascular:     Rate and Rhythm: Normal rate and regular rhythm.     Heart sounds: Normal  heart sounds. No murmur heard.   Pulmonary:     Effort: Pulmonary effort is normal. Tachypnea present. No respiratory distress.  Breath sounds: Wheezing present.     Comments: Speaking in short sentences Abdominal:     Palpations: Abdomen is soft.     Tenderness: There is no abdominal tenderness.  Musculoskeletal:     Cervical back: Neck supple.     Comments: No significant ble edema  Skin:    General: Skin is warm and dry.  Neurological:     Mental Status: He is alert.     ED Results / Procedures / Treatments   Labs (all labs ordered are listed, but only abnormal results are displayed) Labs Reviewed  BASIC METABOLIC PANEL - Abnormal; Notable for the following components:      Result Value   Glucose, Bld 113 (*)    BUN 28 (*)    All other components within normal limits  RESP PANEL BY RT-PCR (FLU A&B, COVID) [id number]  CBC WITH DIFFERENTIAL/PLATELET  TROPONIN I (HIGH SENSITIVITY)  TROPONIN I (HIGH SENSITIVITY)    EKG EKG Interpretation  Date/Time:  Wednesday July 09 2020 14:00:52 EDT Ventricular Rate:  67 PR Interval:  144 QRS Duration: 90 QT Interval:  430 QTC Calculation: 454 R Axis:   52 Text Interpretation: Normal sinus rhythm Normal ECG Confirmed by Thamas Jaegers ([id number]) on 07/09/2020 2:32:45 PM   Radiology DG Chest 2 View  Result Date: 07/09/2020 CLINICAL DATA:  Shortness of breath and cough. EXAM: CHEST - 2 VIEW COMPARISON:  PA and lateral chest 06/06/2020.  CT chest 05/19/2020. FINDINGS: Lungs clear. Heart size normal. No pneumothorax or pleural fluid. No acute or focal bony abnormality. IMPRESSION: No acute disease. Electronically Signed   By: Inge Rise M.D.   On: 07/09/2020 14:32    Procedures Procedures   CRITICAL CARE Performed by: Rodney Booze   Total critical care time: 32 minutes  Critical care time was exclusive of separately billable procedures and treating other patients.  Critical care was necessary to treat or prevent imminent  or life-threatening deterioration.  Critical care was time spent personally by me on the following activities: development of treatment plan with patient and/or surrogate as well as nursing, discussions with consultants, evaluation of patient's response to treatment, examination of patient, obtaining history from patient or surrogate, ordering and performing treatments and interventions, ordering and review of laboratory studies, ordering and review of radiographic studies, pulse oximetry and re-evaluation of patient's condition.   Medications Ordered in ED Medications  albuterol (VENTOLIN HFA) 108 (90 Base) MCG/ACT inhaler 6 puff (6 puffs Inhalation Not Given 07/09/20 1853)  methylPREDNISolone sodium succinate (SOLU-MEDROL) 125 mg/2 mL injection 125 mg (125 mg Intravenous Given 07/09/20 1824)  magnesium sulfate IVPB 2 g 50 mL (2 g Intravenous New Bag/Given 07/09/20 1826)  albuterol (PROVENTIL,VENTOLIN) solution continuous neb (10 mg/hr Nebulization Given 07/09/20 1852)  ipratropium (ATROVENT) nebulizer solution 0.5 mg (0.5 mg Nebulization Given 07/09/20 1852)    ED Course  I have reviewed the triage vital signs and the nursing notes.  Pertinent labs & imaging results that were available during my care of the patient were reviewed by me and considered in my medical decision making (see chart for details).    MDM Rules/Calculators/A&P                          75 year old male presenting for evaluation of shortness of breath and cough for the last few days.  On exam he is diffusely wheezy and he is tachypneic  Reviewed/interpreted labs CBC is unremarkable BMP with elevated BUN, otherwise  reassuring Delta tropes are negative COVID is negative  EKG - Normal sinus rhythm Normal ECG   CXR - No acute disease.   Patient was given magnesium, Solu-Medrol, and hour-long neb and on reassessment he still has diffuse wheezing and does not feel back to baseline.  I feel that he will require admission for  further treatment and close monitoring  8:40 PM CONSULT with Dr. Josephine Cables who accepts patient for admission    Final Clinical Impression(s) / ED Diagnoses Final diagnoses:  None    Rx / DC Orders ED Discharge Orders    None       Bishop [place] 07/09/20 2048    Fredia Sorrow, MD 07/10/20 [phone]

## 2020-07-10 ENCOUNTER — Telehealth: Payer: Self-pay | Admitting: Family Medicine

## 2020-07-10 ENCOUNTER — Ambulatory Visit: Payer: PPO | Admitting: Family

## 2020-07-10 DIAGNOSIS — E119 Type 2 diabetes mellitus without complications: Secondary | ICD-10-CM | POA: Diagnosis not present

## 2020-07-10 DIAGNOSIS — I10 Essential (primary) hypertension: Secondary | ICD-10-CM

## 2020-07-10 DIAGNOSIS — J45901 Unspecified asthma with (acute) exacerbation: Secondary | ICD-10-CM

## 2020-07-10 DIAGNOSIS — I2510 Atherosclerotic heart disease of native coronary artery without angina pectoris: Secondary | ICD-10-CM

## 2020-07-10 DIAGNOSIS — I5032 Chronic diastolic (congestive) heart failure: Secondary | ICD-10-CM | POA: Diagnosis not present

## 2020-07-10 DIAGNOSIS — K219 Gastro-esophageal reflux disease without esophagitis: Secondary | ICD-10-CM

## 2020-07-10 LAB — CBC
HCT: 51.2 % (ref 39.0–52.0)
Hemoglobin: 16.4 g/dL (ref 13.0–17.0)
MCH: 29.1 pg (ref 26.0–34.0)
MCHC: 32 g/dL (ref 30.0–36.0)
MCV: 90.9 fL (ref 80.0–100.0)
Platelets: 211 10*3/uL (ref 150–400)
RBC: 5.63 MIL/uL (ref 4.22–5.81)
RDW: 15.4 % (ref 11.5–15.5)
WBC: 12.4 10*3/uL — ABNORMAL HIGH (ref 4.0–10.5)
nRBC: 0 % (ref 0.0–0.2)

## 2020-07-10 LAB — COMPREHENSIVE METABOLIC PANEL
ALT: 28 U/L (ref 0–44)
AST: 24 U/L (ref 15–41)
Albumin: 4.2 g/dL (ref 3.5–5.0)
Alkaline Phosphatase: 89 U/L (ref 38–126)
Anion gap: 14 (ref 5–15)
BUN: 31 mg/dL — ABNORMAL HIGH (ref 8–23)
CO2: 25 mmol/L (ref 22–32)
Calcium: 9.2 mg/dL (ref 8.9–10.3)
Chloride: 97 mmol/L — ABNORMAL LOW (ref 98–111)
Creatinine, Ser: 1.24 mg/dL (ref 0.61–1.24)
GFR, Estimated: >60 mL/min (ref >60)
Glucose, Bld: 313 mg/dL — ABNORMAL HIGH (ref 70–99)
Potassium: 3.7 mmol/L (ref 3.5–5.1)
Sodium: 136 mmol/L (ref 135–145)
Total Bilirubin: 0.7 mg/dL (ref 0.3–1.2)
Total Protein: 7.6 g/dL (ref 6.5–8.1)

## 2020-07-10 LAB — GLUCOSE, CAPILLARY
Glucose-Capillary: 293 mg/dL — ABNORMAL HIGH (ref 70–99)
Glucose-Capillary: 339 mg/dL — ABNORMAL HIGH (ref 70–99)
Glucose-Capillary: 410 mg/dL — ABNORMAL HIGH (ref 70–99)

## 2020-07-10 LAB — APTT: aPTT: 28 seconds (ref 24–36)

## 2020-07-10 LAB — PHOSPHORUS: Phosphorus: 2.2 mg/dL — ABNORMAL LOW (ref 2.5–4.6)

## 2020-07-10 LAB — MAGNESIUM: Magnesium: 2.4 mg/dL (ref 1.7–2.4)

## 2020-07-10 LAB — PROTIME-INR
INR: 1 (ref 0.8–1.2)
Prothrombin Time: 13.4 seconds (ref 11.4–15.2)

## 2020-07-10 MED ORDER — TAMSULOSIN HCL 0.4 MG PO CAPS: 0.4000 mg | ORAL_CAPSULE | Freq: Every day | ORAL | Status: DC

## 2020-07-10 MED ORDER — NITROGLYCERIN 0.4 MG SL SUBL: 0.4000 mg | SUBLINGUAL_TABLET | SUBLINGUAL | Status: DC | PRN

## 2020-07-10 MED ORDER — DM-GUAIFENESIN ER 30-600 MG PO TB12
1.0000 | ORAL_TABLET | Freq: Two times a day (BID) | ORAL | Status: DC
  Administered 2020-07-10 (×2): 1 via ORAL
  Filled 2020-07-10 (×2): qty 1

## 2020-07-10 MED ORDER — HYDRALAZINE HCL 25 MG PO TABS
50.0000 mg | ORAL_TABLET | Freq: Three times a day (TID) | ORAL | Status: DC
  Administered 2020-07-10 (×2): 50 mg via ORAL
  Filled 2020-07-10 (×2): qty 2

## 2020-07-10 MED ORDER — IPRATROPIUM-ALBUTEROL 0.5-2.5 (3) MG/3ML IN SOLN
3.0000 mL | Freq: Four times a day (QID) | RESPIRATORY_TRACT | Status: DC
  Administered 2020-07-10 (×3): 3 mL via RESPIRATORY_TRACT
  Filled 2020-07-10 (×3): qty 3

## 2020-07-10 MED ORDER — GUAIFENESIN-DM 100-10 MG/5ML PO SYRP
10.0000 mL | ORAL_SOLUTION | Freq: Three times a day (TID) | ORAL | Status: DC
  Administered 2020-07-10: 10 mL via ORAL
  Filled 2020-07-10: qty 10

## 2020-07-10 MED ORDER — METHYLPREDNISOLONE 4 MG PO TBPK: ORAL_TABLET | ORAL | 0 refills | Status: DC

## 2020-07-10 MED ORDER — ALBUTEROL SULFATE (2.5 MG/3ML) 0.083% IN NEBU: 2.5000 mg | INHALATION_SOLUTION | Freq: Four times a day (QID) | RESPIRATORY_TRACT | 1 refills | Status: DC | PRN

## 2020-07-10 MED ORDER — ATORVASTATIN CALCIUM 40 MG PO TABS
40.0000 mg | ORAL_TABLET | Freq: Every day | ORAL | Status: DC
  Administered 2020-07-10: 40 mg via ORAL
  Filled 2020-07-10: qty 1

## 2020-07-10 MED ORDER — ISOSORBIDE DINITRATE 20 MG PO TABS
20.0000 mg | ORAL_TABLET | Freq: Three times a day (TID) | ORAL | Status: DC
  Administered 2020-07-10 (×2): 20 mg via ORAL
  Filled 2020-07-10 (×2): qty 1

## 2020-07-10 MED ORDER — INSULIN ASPART 100 UNIT/ML IJ SOLN
0.0000 [IU] | Freq: Three times a day (TID) | INTRAMUSCULAR | Status: DC
  Administered 2020-07-10: 5 [IU] via SUBCUTANEOUS
  Administered 2020-07-10: 7 [IU] via SUBCUTANEOUS

## 2020-07-10 MED ORDER — METHYLPREDNISOLONE SODIUM SUCC 40 MG IJ SOLR
40.0000 mg | Freq: Two times a day (BID) | INTRAMUSCULAR | Status: DC
  Administered 2020-07-10: 40 mg via INTRAVENOUS
  Filled 2020-07-10: qty 1

## 2020-07-10 MED ORDER — AZITHROMYCIN 250 MG PO TABS: 250.0000 mg | ORAL_TABLET | Freq: Every day | ORAL | Status: DC

## 2020-07-10 MED ORDER — IPRATROPIUM-ALBUTEROL 0.5-2.5 (3) MG/3ML IN SOLN
3.0000 mL | RESPIRATORY_TRACT | Status: DC | PRN
  Filled 2020-07-10: qty 3

## 2020-07-10 MED ORDER — PANTOPRAZOLE SODIUM 40 MG PO TBEC
40.0000 mg | DELAYED_RELEASE_TABLET | Freq: Every day | ORAL | Status: DC
  Administered 2020-07-10: 40 mg via ORAL
  Filled 2020-07-10: qty 1

## 2020-07-10 MED ORDER — GUAIFENESIN-DM 100-10 MG/5ML PO SYRP: 5.0000 mL | ORAL_SOLUTION | ORAL | Status: DC | PRN

## 2020-07-10 MED ORDER — INSULIN ASPART 100 UNIT/ML IJ SOLN
0.0000 [IU] | Freq: Every day | INTRAMUSCULAR | Status: DC
  Administered 2020-07-10: 5 [IU] via SUBCUTANEOUS

## 2020-07-10 MED ORDER — AZITHROMYCIN 250 MG PO TABS
500.0000 mg | ORAL_TABLET | Freq: Every day | ORAL | Status: AC
  Administered 2020-07-10: 500 mg via ORAL
  Filled 2020-07-10: qty 2

## 2020-07-10 MED ORDER — AZITHROMYCIN 500 MG PO TABS: 500.0000 mg | ORAL_TABLET | Freq: Every day | ORAL | 0 refills | Status: AC

## 2020-07-10 MED ORDER — COLCHICINE 0.6 MG PO TABS: 0.6000 mg | ORAL_TABLET | Freq: Every day | ORAL | Status: DC

## 2020-07-10 NOTE — Telephone Encounter (Signed)
I do not understand how the nebulizer albuterol could be rejected but will send in differently

## 2020-07-10 NOTE — Discharge Summary (Signed)
Physician Discharge Summary [facility]    Patient: Kenneth Fuller                   Admit date: 07/09/2020   DOB: [birth year]             Discharge date:07/10/2020/12:10 PM [id number]:[id number]                          PCP: [name], [name], MD  Disposition: HOME   Recommendations for Outpatient Follow-up:   Follow up: Pulmonologist in 1 to 2 weeks Follow-up with PCP in 2-3 weeks Patient is to avoid all allergens, continue his DuoNeb bronchodilator treatments with a taper down steroids and empiric antibiotics prescribed  Discharge Condition: Stable   Code Status:   Code Status: Full Code  Diet recommendation: Cardiac diet   Discharge Diagnoses:    Active Problems:   Diabetes mellitus type II, non insulin dependent (HCC)   Gout   Essential hypertension   OSA on CPAP   GERD (gastroesophageal reflux disease)   Coronary artery disease involving native coronary artery of native heart without angina pectoris   Obesity, Class III, BMI 40-49.9 (morbid obesity) (HCC)   Chronic diastolic heart failure (HCC)   Asthma exacerbation   History of Present Illness/ Hospital Course Kenneth Fuller Summary:     Kenneth Fuller is a 75 y.o. male with medical history significant for chronic diastolic CHF, obesity, OSA, hyperlipidemia, diabetes mellitus, neuropathy, minor CAD, moderately severe restrictive lung disease, most recent cardiac cath in October 2018 showed a maximal stenosis of 10% in the mid LAD, asthma, hypertension, GERD, BPH who presents to the emergency department due to 4 days onset of shortness of breath associated with cough and wheezing.  Patient complained of chest soreness from frequent coughing.  He states that he mowed his lawn on Saturday (5/28) and that symptoms started shortly after this.  Patient states that he recently ran out of his nebulizer solution he denies fever, chills and patient does not use oxygen at baseline.  Patient states that he quit smoking since  1971.  ED Course:  In the emergency department, he was hemodynamically stable.  Work-up in the ED showed normal CBC and BMP except for elevated BUN at 28.  Troponin x2 was flat at 5.  Influenza A, B and SARS coronavirus 2 was negative. Chest x-ray showed no acute disease and EKG showed normal sinus rhythm at a rate of 67 bpm Breathing treatment was provided, Solu-Medrol was given.  Hospitalist was asked to admit patient for further evaluation and management.     Acute on chronic COPD with concomitant asthma exacerbation -Shortness of breath much improved, satting 97% on room air Continue duo nebs, Mucinex, Solu-Medrol, azithromycin. -Responded well to DuoNeb bronchodilators, mucolytic's IV Solu-Medrol and azithromycin, -Steroids are switched to p.o. prednisone with quick taper, continue mucolytic's, -Patient states he has a nebulizer machine at home, with medication ordered he is to continue DuoNeb treatment every 4 to 6 hours for shortness of breath -Prescription for Medrol Dosepak, azithromycin given Continue Protonix to prevent steroid-induced ulcer Continue incentive spirometry and flutter valve  Hyperlipidemia Continue Lipitor  Essential hypertension -His blood pressure is elevated this morning, resuming his home medication of hydralazine and isosorbide -He is to monitor his blood pressure closely  History of CAD Troponin x2 was flat at 5, patient only complained of chest soreness from coughing ECG showed normal sinus rhythm at a rate of 67  bpm Continue Lipitor, Isordil, Nitrostat  GERD Continue Protonix  T2DM Continue ISS and hypoglycemic protocol  Chronic diastolic CHF Patient appears euvolemic  BPH Continue Flomax  OSA Continue on BiPAP per home regimen  Gout Continue colchicine  Obesity (BMI 41.57) Patient counseled on diet and lifestyle modification   Disposition -patient is cleared to be discharged home  Discharge Instructions:    Discharge Instructions    Activity as tolerated - No restrictions   Complete by: As directed    Diet - low sodium heart healthy   Complete by: As directed    Discharge instructions   Complete by: As directed    Continue inhalers, DuoNeb bronchodilators every 4 to 6 hours, Continue taper down prednisone, and empiric antibiotics avoid all allergens Follow-up with your pulmonologist in 1-2 weeks.   Increase activity slowly   Complete by: As directed        Medication List    STOP taking these medications   azelastine 0.05 % ophthalmic solution Commonly known as: OPTIVAR   gabapentin 100 MG capsule Commonly known as: NEURONTIN   High Potency Iron 65 MG Tabs   prednisoLONE acetate 1 % ophthalmic suspension Commonly known as: PRED FORTE     TAKE these medications   albuterol 108 (90 Base) MCG/ACT inhaler Commonly known as: VENTOLIN HFA Inhale 2 puffs into the lungs every 6 (six) hours as needed for wheezing or shortness of breath. What changed: Another medication with the same name was added. Make sure you understand how and when to take each.   albuterol (2.5 MG/3ML) 0.083% nebulizer solution Commonly known as: PROVENTIL Take 3 mLs (2.5 mg total) by nebulization every 6 (six) hours as needed for wheezing or shortness of breath. What changed: You were already taking a medication with the same name, and this prescription was added. Make sure you understand how and when to take each.   atorvastatin 40 MG tablet Commonly known as: LIPITOR Take 40 mg by mouth daily. 1 Tablet Daily   azithromycin 500 MG tablet Commonly known as: Zithromax Take 1 tablet (500 mg total) by mouth daily for 4 days. Take 1 tablet daily for 3 days.   bisoprolol 5 MG tablet Commonly known as: ZEBETA Take 1/2 (one-half) tablet by mouth once daily What changed: See the new instructions.   Breztri Aerosphere 160-9-4.8 MCG/ACT Aero Generic drug: Budeson-Glycopyrrol-Formoterol Inhale 2 puffs into  the lungs in the morning and at bedtime.   cloNIDine 0.2 MG tablet Commonly known as: CATAPRES Take 1 tablet (0.2 mg total) by mouth 3 (three) times daily. (NEEDS TO BE SEEN BEFORE NEXT REFILL)   colchicine 0.6 MG tablet Take 0.6 mg by mouth daily. As needed   dapagliflozin propanediol 5 MG Tabs tablet Commonly known as: Farxiga Take 1 tablet (5 mg total) by mouth daily.   famotidine 20 MG tablet Commonly known as: PEPCID Take 1 tablet (20 mg total) by mouth at bedtime.   FeroSul 325 (65 FE) MG tablet Generic drug: ferrous sulfate Take 325 mg by mouth 3 (three) times daily.   furosemide 40 MG tablet Commonly known as: LASIX Take 40 mg by mouth daily as needed (fluid retention.).   hydrALAZINE 50 MG tablet Commonly known as: APRESOLINE Take 1 tablet (50 mg total) by mouth 3 (three) times daily.   isosorbide dinitrate 20 MG tablet Commonly known as: ISORDIL Take 1 tablet (20 mg total) by mouth 3 (three) times daily.   methylPREDNISolone 4 MG Tbpk tablet Commonly known as: MEDROL  DOSEPAK Medrol Dosepak take as instructed   Myrbetriq 25 MG Tb24 tablet Generic drug: mirabegron ER Take 1 tablet by mouth once daily   nitroGLYCERIN 0.4 MG SL tablet Commonly known as: NITROSTAT Place 1 tablet (0.4 mg total) under the tongue every 5 (five) minutes x 3 doses as needed for chest pain.   omeprazole 40 MG capsule Commonly known as: PRILOSEC Take 1 capsule (40 mg total) by mouth 2 (two) times daily at 8 am and 10 pm. Take 30-60 minutes prior to breakfast.   polyethylene glycol 17 g packet Commonly known as: MIRALAX / GLYCOLAX Take 17 g by mouth daily as needed for mild constipation.   Restasis 0.05 % ophthalmic emulsion Generic drug: cycloSPORINE Place 1 drop into both eyes in the morning and at bedtime.   sertraline 100 MG tablet Commonly known as: ZOLOFT Take 1 tablet (100 mg total) by mouth daily. (NEEDS TO BE SEEN BEFORE NEXT REFILL)   tamsulosin 0.4 MG Caps  capsule Commonly known as: FLOMAX Take 1 capsule (0.4 mg total) by mouth daily after supper.   Vitamin D3 125 MCG (5000 UT) Tabs Take 5,000 Units by mouth daily.       Allergies  Allergen Reactions  . Amlodipine Besy-Benazepril Hcl Swelling and Other (See Comments)    Makes tongue swell (lotrel)  . Oxycodone Itching  . Phenergan [Promethazine Hcl] Other (See Comments)    "I can't remember."  . Hydrocodone Itching    Can tolerate in low doses     Procedures /Studies:   DG Chest 2 View  Result Date: 07/09/2020 CLINICAL DATA:  Shortness of breath and cough. EXAM: CHEST - 2 VIEW COMPARISON:  PA and lateral chest 06/06/2020.  CT chest 05/19/2020. FINDINGS: Lungs clear. Heart size normal. No pneumothorax or pleural fluid. No acute or focal bony abnormality. IMPRESSION: No acute disease. Electronically Signed   By: Inge Rise M.D.   On: 07/09/2020 14:32   Cardiac event monitor  Result Date: 07/01/2020  The dominant rhythm is normal sinus with blunted circadian variation, suggestive of chronotropic incompetence. The maximum heart rate is only 74% of the maximum predicted for age.  There are very rare premature atrial and premature ventricular beats and a single 4 beat run of nonsustained ventricular tachycardia.  Mild bradycardia seen, there are no episodes of severe bradycardia or pauses or atrioventricular block.  There is no evidence of atrial fibrillation.  Mildly abnormal event monitor due to the presence of blunted circadian rhythm variation and chronotropic incompetence, likely related to beta-blocker use.     Subjective:   Patient was seen and examined 07/10/2020, 12:10 PM Patient stable today. No acute distress.  No issues overnight Stable for discharge.  Discharge Exam:    Vitals:   07/10/20 0141 07/10/20 0242 07/10/20 0629 07/10/20 0717  BP:  (!) 176/87 (!) 172/82   Pulse:  93 80   Resp:  18 20   Temp:  (!) 97.2 F (36.2 C) (!) 97.2 F (36.2 C)   TempSrc:       SpO2: 97% 94% (!) 89% 97%  Weight:      Height:        General: Pt lying comfortably in bed & appears in no obvious distress. Cardiovascular: S1 & S2 heard, RRR, S1/S2 +. No murmurs, rubs, gallops or clicks. No JVD or pedal edema. Respiratory: Improved shortness, no increase respiratory efforts - diffuse wheezing, rhonchi or crackles. No increased work of breathing. Abdominal:  Non-distended, non-tender & soft. No  organomegaly or masses appreciated. Normal bowel sounds heard. CNS: Alert and oriented. No focal deficits. Extremities: no edema, no cyanosis      The results of significant diagnostics from this hospitalization (including imaging, microbiology, ancillary and laboratory) are listed below for reference.      Microbiology:   Recent Results (from the past 240 hour(s))  Resp Panel by RT-PCR (Flu A&B, Covid) Nasopharyngeal Swab     Status: None   Collection Time: 07/09/20  5:05 PM   Specimen: Nasopharyngeal Swab; Nasopharyngeal(NP) swabs in vial transport medium  Result Value Ref Range Status   SARS Coronavirus 2 by RT PCR NEGATIVE NEGATIVE Final    Comment: (NOTE) SARS-CoV-2 target nucleic acids are NOT DETECTED.  The SARS-CoV-2 RNA is generally detectable in upper respiratory specimens during the acute phase of infection. The lowest concentration of SARS-CoV-2 viral copies this assay can detect is 138 copies/mL. A negative result does not preclude SARS-Cov-2 infection and should not be used as the sole basis for treatment or other patient management decisions. A negative result may occur with  improper specimen collection/handling, submission of specimen other than nasopharyngeal swab, presence of viral mutation(s) within the areas targeted by this assay, and inadequate number of viral copies(<138 copies/mL). A negative result must be combined with clinical observations, patient history, and epidemiological information. The expected result is Negative.  Fact  Sheet for Patients:  EntrepreneurPulse.com.au  Fact Sheet for Healthcare Providers:  IncredibleEmployment.be  This test is no t yet approved or cleared by the [place] FDA and  has been authorized for detection and/or diagnosis of SARS-CoV-2 by FDA under an Emergency Use Authorization (EUA). This EUA will remain  in effect (meaning this test can be used) for the duration of the COVID-19 declaration under Section 564(b)(1) of the Act, 21 U.S.C.section 360bbb-3(b)(1), unless the authorization is terminated  or revoked sooner.       Influenza A by PCR NEGATIVE NEGATIVE Final   Influenza B by PCR NEGATIVE NEGATIVE Final    Comment: (NOTE) The Xpert Xpress SARS-CoV-2/FLU/RSV plus assay is intended as an aid in the diagnosis of influenza from Nasopharyngeal swab specimens and should not be used as a sole basis for treatment. Nasal washings and aspirates are unacceptable for Xpert Xpress SARS-CoV-2/FLU/RSV testing.  Fact Sheet for Patients: EntrepreneurPulse.com.au  Fact Sheet for Healthcare Providers: IncredibleEmployment.be  This test is not yet approved or cleared by the [place] FDA and has been authorized for detection and/or diagnosis of SARS-CoV-2 by FDA under an Emergency Use Authorization (EUA). This EUA will remain in effect (meaning this test can be used) for the duration of the COVID-19 declaration under Section 564(b)(1) of the Act, 21 U.S.C. section 360bbb-3(b)(1), unless the authorization is terminated or revoked.  Performed at [facility], [address] [place], [place] [address]      Labs:   CBC: Recent Labs  Lab 07/09/20 1719 07/10/20 0625  WBC 8.8 12.4*  NEUTROABS 5.5  --   HGB 15.1 16.4  HCT 46.9 51.2  MCV 91.1 90.9  PLT 185 798   Basic Metabolic Panel: Recent Labs  Lab 07/09/20 1719 07/10/20 0625  NA 138 136  K 4.3 3.7  CL 98 97*  CO2 31 25  GLUCOSE 113*  313*  BUN 28* 31*  CREATININE 1.14 1.24  CALCIUM 8.9 9.2  MG  --  2.4  PHOS  --  2.2*   Liver Function Tests: Recent Labs  Lab 07/10/20 0625  AST 24  ALT 28  ALKPHOS 89  BILITOT 0.7  PROT 7.6  ALBUMIN 4.2   BNP (last 3 results) Recent Labs    06/06/20 1500  BNP 279.0*   Cardiac Enzymes: No results for input(s): CKTOTAL, CKMB, CKMBINDEX, TROPONINI in the last 168 hours. CBG: Recent Labs  Lab 07/10/20 0032 07/10/20 0723 07/10/20 1111  GLUCAP 410* 293* 339*   Hgb A1c No results for input(s): HGBA1C in the last 72 hours. Lipid Profile No results for input(s): CHOL, HDL, LDLCALC, TRIG, CHOLHDL, LDLDIRECT in the last 72 hours. Thyroid function studies No results for input(s): TSH, T4TOTAL, T3FREE, THYROIDAB in the last 72 hours.  Invalid input(s): FREET3 Anemia work up No results for input(s): VITAMINB12, FOLATE, FERRITIN, TIBC, IRON, RETICCTPCT in the last 72 hours. Urinalysis    Component Value Date/Time   COLORURINE YELLOW 06/04/2019 0155   APPEARANCEUR Clear 11/20/2019 1509   LABSPEC 1.015 06/04/2019 0155   PHURINE 5.0 06/04/2019 0155   GLUCOSEU 3+ (A) 11/20/2019 1509   HGBUR NEGATIVE 06/04/2019 0155   BILIRUBINUR Negative 11/20/2019 1509   KETONESUR NEGATIVE 06/04/2019 0155   PROTEINUR 1+ (A) 11/20/2019 1509   PROTEINUR NEGATIVE 06/04/2019 0155   UROBILINOGEN 0.2 12/26/2013 1135   NITRITE Negative 11/20/2019 1509   NITRITE NEGATIVE 06/04/2019 0155   LEUKOCYTESUR Negative 11/20/2019 [place] 06/04/2019 0155         Time coordinating discharge: Over 45 minutes  SIGNED: Deatra James, MD, FACP, FHM. [facility],  Please use [email or web address] to Page If 7PM-7AM, please contact night-coverage [email or web address] [email or web address] 07/10/2020, 12:10 PM

## 2020-07-10 NOTE — Progress Notes (Signed)
Inpatient Diabetes Program Recommendations  AACE/ADA: New Consensus Statement on Inpatient Glycemic Control (2015)  Target Ranges:  Prepandial:   less than 140 mg/dL      Peak postprandial:   less than 180 mg/dL (1-2 hours)      Critically ill patients:  140 - 180 mg/dL   Lab Results  Component Value Date   GLUCAP 293 (H) 07/10/2020   HGBA1C 5.8 10/04/2019    Review of Glycemic Control Results for Kenneth Fuller, Kenneth Fuller" (MRN [id number]) as of 07/10/2020 09:53  Ref. Range 07/10/2020 00:32 07/10/2020 07:23  Glucose-Capillary Latest Ref Range: 70 - 99 mg/dL 410 (H) 293 (H)    Diabetes history: DM 2 Outpatient Diabetes medications: Farxiga 10 mg Daily Current orders for Inpatient glycemic control:  Novolog 0-9 units tid + hs  Solumedrol 40 mg Q12 hours  Inpatient Diabetes Program Recommendations:    -  Consider Levemir 10 units while on steroids  Thanks,  Tama Headings RN, MSN, BC-ADM Inpatient Diabetes Coordinator Team Pager [phone] (8a-5p)

## 2020-07-10 NOTE — Telephone Encounter (Signed)
FYI 

## 2020-07-11 ENCOUNTER — Telehealth: Payer: Self-pay

## 2020-07-11 LAB — HEMOGLOBIN A1C
Hgb A1c MFr Bld: 7.9 % — ABNORMAL HIGH (ref 4.8–5.6)
Mean Plasma Glucose: 180 mg/dL

## 2020-07-11 NOTE — Telephone Encounter (Signed)
Transition Care Management Follow-up Telephone Call  Date of discharge and from where: [facility] 07[facility]10/20  Diagnosis: COPD with asthma exacerbation  How have you been since you were released from the hospital? Still having trouble with his breathing, still coughing a lot, but is stable  Any questions or concerns? Yes - He was told BiPap worked better for him and kept his oxygen up better than the CPAP, but [facility] said it could be weeks before they could get it approved - he wants to know if we can do anything to speed this up  Items Reviewed:  Did the pt receive and understand the discharge instructions provided? Yes   Medications obtained and verified? Yes   Other? No   Any new allergies since your discharge? No   Dietary orders reviewed? Yes  Do you have support at home? Yes   Home Care and Equipment/Supplies: Were home health services ordered? no If so, what is the name of the agency? n/a  Has the agency set up a time to come to the patient's home? not applicable Were any new equipment or medical supplies ordered?  Yes: BiPap What is the name of the medical supply agency? [facility] Were you able to get the supplies/equipment? no Do you have any questions related to the use of the equipment or supplies? Yes: see above  Functional Questionnaire: (I = Independent and D = Dependent) ADLs: I  Bathing/Dressing- I  Meal Prep- I  Eating- I  Maintaining continence- I  Transferring/Ambulation- I  Managing Meds- I  Follow up appointments reviewed:   PCP Hospital f/u appt confirmed? Yes  Scheduled to see [name] on 07/14/20 @ 9:55.  [facility] f/u appt confirmed? Yes  Scheduled to see Pulmonology on next week  Are transportation arrangements needed? No   If their condition worsens, is the pt aware to call PCP or go to the Emergency Dept.? Yes  Was the patient provided with contact information for the PCP's office or ED? Yes  Was to pt  encouraged to call back with questions or concerns? Yes

## 2020-07-14 ENCOUNTER — Other Ambulatory Visit: Payer: Self-pay

## 2020-07-14 ENCOUNTER — Encounter: Payer: Self-pay | Admitting: Family Medicine

## 2020-07-14 ENCOUNTER — Ambulatory Visit (INDEPENDENT_AMBULATORY_CARE_PROVIDER_SITE_OTHER): Payer: PPO | Admitting: Family Medicine

## 2020-07-14 VITALS — BP 141/89 | HR 90 | Temp 98.1°F | Resp 20 | Ht 67.0 in | Wt 265.1 lb

## 2020-07-14 DIAGNOSIS — R809 Proteinuria, unspecified: Secondary | ICD-10-CM | POA: Diagnosis not present

## 2020-07-14 DIAGNOSIS — I129 Hypertensive chronic kidney disease with stage 1 through stage 4 chronic kidney disease, or unspecified chronic kidney disease: Secondary | ICD-10-CM | POA: Diagnosis not present

## 2020-07-14 DIAGNOSIS — N189 Chronic kidney disease, unspecified: Secondary | ICD-10-CM | POA: Diagnosis not present

## 2020-07-14 DIAGNOSIS — E611 Iron deficiency: Secondary | ICD-10-CM | POA: Diagnosis not present

## 2020-07-14 DIAGNOSIS — J441 Chronic obstructive pulmonary disease with (acute) exacerbation: Secondary | ICD-10-CM | POA: Diagnosis not present

## 2020-07-14 DIAGNOSIS — N179 Acute kidney failure, unspecified: Secondary | ICD-10-CM | POA: Diagnosis not present

## 2020-07-14 DIAGNOSIS — E1129 Type 2 diabetes mellitus with other diabetic kidney complication: Secondary | ICD-10-CM | POA: Diagnosis not present

## 2020-07-14 DIAGNOSIS — I5042 Chronic combined systolic (congestive) and diastolic (congestive) heart failure: Secondary | ICD-10-CM | POA: Diagnosis not present

## 2020-07-14 DIAGNOSIS — E1122 Type 2 diabetes mellitus with diabetic chronic kidney disease: Secondary | ICD-10-CM | POA: Diagnosis not present

## 2020-07-14 LAB — CBC WITH DIFFERENTIAL/PLATELET
Basophils Absolute: 0.1 10*3/uL (ref 0.0–0.2)
Basos: 1 %
EOS (ABSOLUTE): 0.1 10*3/uL (ref 0.0–0.4)
Eos: 0 %
Hematocrit: 48.5 % (ref 37.5–51.0)
Hemoglobin: 16.3 g/dL (ref 13.0–17.7)
Immature Grans (Abs): 0.3 10*3/uL — ABNORMAL HIGH (ref 0.0–0.1)
Immature Granulocytes: 2 %
Lymphocytes Absolute: 2.3 10*3/uL (ref 0.7–3.1)
Lymphs: 14 %
MCH: 29 pg (ref 26.6–33.0)
MCHC: 33.6 g/dL (ref 31.5–35.7)
MCV: 86 fL (ref 79–97)
Monocytes Absolute: 0.9 10*3/uL (ref 0.1–0.9)
Monocytes: 6 %
Neutrophils Absolute: 12.9 10*3/uL — ABNORMAL HIGH (ref 1.4–7.0)
Neutrophils: 77 %
Platelets: 250 10*3/uL (ref 150–450)
RBC: 5.62 x10E6/uL (ref 4.14–5.80)
RDW: 15.1 % (ref 11.6–15.4)
WBC: 16.7 10*3/uL — ABNORMAL HIGH (ref 3.4–10.8)

## 2020-07-14 LAB — CMP14+EGFR
ALT: 33 IU/L (ref 0–44)
AST: 20 IU/L (ref 0–40)
Albumin/Globulin Ratio: 2.1 (ref 1.2–2.2)
Albumin: 4.6 g/dL (ref 3.7–4.7)
Alkaline Phosphatase: 98 IU/L (ref 44–121)
BUN/Creatinine Ratio: 25 — ABNORMAL HIGH (ref 10–24)
BUN: 31 mg/dL — ABNORMAL HIGH (ref 8–27)
Bilirubin Total: 0.5 mg/dL (ref 0.0–1.2)
CO2: 25 mmol/L (ref 20–29)
Calcium: 9.8 mg/dL (ref 8.6–10.2)
Chloride: 96 mmol/L (ref 96–106)
Creatinine, Ser: 1.25 mg/dL (ref 0.76–1.27)
Globulin, Total: 2.2 g/dL (ref 1.5–4.5)
Glucose: 191 mg/dL — ABNORMAL HIGH (ref 65–99)
Potassium: 5.5 mmol/L — ABNORMAL HIGH (ref 3.5–5.2)
Sodium: 139 mmol/L (ref 134–144)
Total Protein: 6.8 g/dL (ref 6.0–8.5)
eGFR: 60 mL/min/{1.73_m2} (ref >59)

## 2020-07-14 MED ORDER — METHYLPREDNISOLONE ACETATE 40 MG/ML IJ SUSP
80.0000 mg | Freq: Once | INTRAMUSCULAR | Status: AC
  Administered 2020-07-14: 80 mg via INTRAMUSCULAR

## 2020-07-14 NOTE — Progress Notes (Signed)
BP (!) 141/89   Pulse 90   Temp 98.1 F (36.7 C) (Temporal)   Resp 20   Ht _0  (1.702 m)   Wt 265 lb 2 oz (120.3 kg)   SpO2 95%   BMI 41.52 kg/m    Subjective:   Patient ID: Kenneth Fuller, male    DOB: February 06, 1946, 75 y.o.   MRN: [id number]  HPI: Kenneth Fuller is a 75 y.o. male presenting on 07/14/2020 for Hospitalization Follow-up   HPI Transition Care Management Follow-up Telephone Call  Date of discharge and from where: [facility] 07/10/20  Diagnosis: COPD with asthma exacerbation  Called by Adalberto Cole on 07/11/2020  Transitional care visit Patient was admitted to the hospital on 07/09/2020 and discharged on 07/10/2020 for exacerbation of asthma/COPD.  During the ER visit he had a chest x-ray and EKG were normal.  Solu-Medrol and steroids were given.  He was then sent home with azithromycin and a Medrol Dosepak.  He was COVID and flu negative.  He is also found to have mild AKI during the visit.    Patient seen in office today 07/14/2020.  Patient says he still having some breathing tightness although it is somewhat improved but is still there.  He is still taking the steroids.  He does feels like its not getting better to the point where he would like to.  He still doing albuterol every 6 hours and it is helping but he still just feels that tightness and then because he is using albuterol so frequently he gets panicky attacks.  He does not have a follow-up appoint with his lung doctor, recommended that he go ahead and make 1.   Relevant past medical, surgical, family and social history reviewed and updated as indicated. Interim medical history since our last visit reviewed. Allergies and medications reviewed and updated.  Review of Systems  Constitutional: Negative for chills and fever.  Eyes: Negative for visual disturbance.  Respiratory: Negative for shortness of breath and wheezing.   Cardiovascular: Negative for chest pain and leg swelling.  Musculoskeletal:  Negative for back pain and gait problem.  Skin: Negative for rash.  All other systems reviewed and are negative.   Per HPI unless specifically indicated above   Allergies as of 07/14/2020      Reactions   Amlodipine Besy-benazepril Hcl Swelling, Other (See Comments)   Makes tongue swell (lotrel)   Oxycodone Itching   Phenergan [promethazine Hcl] Other (See Comments)   "I can't remember."   Hydrocodone Itching   Can tolerate in low doses      Medication List       Accurate as of July 14, 2020 10:14 AM. If you have any questions, ask your nurse or doctor.        albuterol 108 (90 Base) MCG/ACT inhaler Commonly known as: VENTOLIN HFA Inhale 2 puffs into the lungs every 6 (six) hours as needed for wheezing or shortness of breath.   albuterol (2.5 MG/3ML) 0.083% nebulizer solution Commonly known as: PROVENTIL Take 3 mLs (2.5 mg total) by nebulization every 6 (six) hours as needed for wheezing or shortness of breath.   atorvastatin 40 MG tablet Commonly known as: LIPITOR Take 40 mg by mouth daily. 1 Tablet Daily   azithromycin 500 MG tablet Commonly known as: Zithromax Take 1 tablet (500 mg total) by mouth daily for 4 days. Take 1 tablet daily for 3 days.   bisoprolol 5 MG tablet Commonly known as: ZEBETA Take 1/2 (  one-half) tablet by mouth once daily What changed: See the new instructions.   Breztri Aerosphere 160-9-4.8 MCG/ACT Aero Generic drug: Budeson-Glycopyrrol-Formoterol Inhale 2 puffs into the lungs in the morning and at bedtime.   cloNIDine 0.2 MG tablet Commonly known as: CATAPRES Take 1 tablet (0.2 mg total) by mouth 3 (three) times daily. (NEEDS TO BE SEEN BEFORE NEXT REFILL)   colchicine 0.6 MG tablet Take 0.6 mg by mouth daily. As needed   dapagliflozin propanediol 5 MG Tabs tablet Commonly known as: Farxiga Take 1 tablet (5 mg total) by mouth daily.   famotidine 20 MG tablet Commonly known as: PEPCID Take 1 tablet (20 mg total) by mouth at  bedtime.   FeroSul 325 (65 FE) MG tablet Generic drug: ferrous sulfate Take 325 mg by mouth 3 (three) times daily.   furosemide 40 MG tablet Commonly known as: LASIX Take 40 mg by mouth daily as needed (fluid retention.).   hydrALAZINE 50 MG tablet Commonly known as: APRESOLINE Take 1 tablet (50 mg total) by mouth 3 (three) times daily.   isosorbide dinitrate 20 MG tablet Commonly known as: ISORDIL Take 1 tablet (20 mg total) by mouth 3 (three) times daily.   methylPREDNISolone 4 MG Tbpk tablet Commonly known as: MEDROL DOSEPAK Medrol Dosepak take as instructed   Myrbetriq 25 MG Tb24 tablet Generic drug: mirabegron ER Take 1 tablet by mouth once daily   nitroGLYCERIN 0.4 MG SL tablet Commonly known as: NITROSTAT Place 1 tablet (0.4 mg total) under the tongue every 5 (five) minutes x 3 doses as needed for chest pain.   omeprazole 40 MG capsule Commonly known as: PRILOSEC Take 1 capsule (40 mg total) by mouth 2 (two) times daily at 8 am and 10 pm. Take 30-60 minutes prior to breakfast.   polyethylene glycol 17 g packet Commonly known as: MIRALAX / GLYCOLAX Take 17 g by mouth daily as needed for mild constipation.   Restasis 0.05 % ophthalmic emulsion Generic drug: cycloSPORINE Place 1 drop into both eyes in the morning and at bedtime.   sertraline 100 MG tablet Commonly known as: ZOLOFT Take 1 tablet (100 mg total) by mouth daily. (NEEDS TO BE SEEN BEFORE NEXT REFILL)   tamsulosin 0.4 MG Caps capsule Commonly known as: FLOMAX Take 1 capsule (0.4 mg total) by mouth daily after supper.   Vitamin D3 125 MCG (5000 UT) Tabs Take 5,000 Units by mouth daily.        Objective:   BP (!) 141/89   Pulse 90   Temp 98.1 F (36.7 C) (Temporal)   Resp 20   Ht _0  (1.702 m)   Wt 265 lb 2 oz (120.3 kg)   SpO2 95%   BMI 41.52 kg/m   Wt Readings from Last 3 Encounters:  07/14/20 265 lb 2 oz (120.3 kg)  07/09/20 265 lb 6.9 oz (120.4 kg)  06/10/20 264 lb (119.7 kg)     Physical Exam Vitals and nursing note reviewed.  Constitutional:      General: He is not in acute distress.    Appearance: He is well-developed. He is not diaphoretic.  Eyes:     General: No scleral icterus.    Conjunctiva/sclera: Conjunctivae normal.  Neck:     Thyroid: No thyromegaly.  Cardiovascular:     Rate and Rhythm: Normal rate and regular rhythm.     Heart sounds: Normal heart sounds. No murmur heard.   Pulmonary:     Effort: Pulmonary effort is normal. No respiratory  distress.     Breath sounds: Wheezing present. No rhonchi or rales.  Chest:     Chest wall: No tenderness.  Neurological:     Mental Status: He is alert and oriented to person, place, and time.     Coordination: Coordination normal.  Psychiatric:        Behavior: Behavior normal.       Assessment & Plan:   Problem List Items Addressed This Visit      Genitourinary   AKI (acute kidney injury) ([place])   Relevant Orders   CMP14+EGFR   CBC with Differential/Platelet    Other Visit Diagnoses    Acute exacerbation of chronic obstructive pulmonary disease (COPD) ([place])    -  Primary   Relevant Medications   methylPREDNISolone acetate (DEPO-MEDROL) injection 80 mg (Start on 07/14/2020 10:45 AM)   Other Relevant Orders   CMP14+EGFR   CBC with Differential/Platelet      Will give steroid shot to see if that improves things.  We will do blood work.  Follow-up in 4 to 6 weeks Follow up plan: Return in about 6 weeks (around 08/25/2020), or if symptoms worsen or fail to improve, for copd f/u.  Counseling provided for all of the vaccine components No orders of the defined types were placed in this encounter.   Caryl Pina, MD [facility] 07/14/2020, 10:14 AM

## 2020-07-15 ENCOUNTER — Ambulatory Visit (INDEPENDENT_AMBULATORY_CARE_PROVIDER_SITE_OTHER): Payer: PPO | Admitting: Gastroenterology

## 2020-07-15 ENCOUNTER — Encounter: Payer: Self-pay | Admitting: Gastroenterology

## 2020-07-15 VITALS — BP 150/90 | HR 72 | Ht 66.25 in | Wt 265.4 lb

## 2020-07-15 DIAGNOSIS — R1310 Dysphagia, unspecified: Secondary | ICD-10-CM

## 2020-07-15 DIAGNOSIS — R059 Cough, unspecified: Secondary | ICD-10-CM

## 2020-07-15 NOTE — Patient Instructions (Signed)
If you are age 74 or older, your body mass index should be between 23-30. Your Body mass index is 42.51 kg/m. If this is out of the aforementioned range listed, please consider follow up with your Primary Care Provider.  If you are age 64 or younger, your body mass index should be between 19-25. Your Body mass index is 42.51 kg/m. If this is out of the aformentioned range listed, please consider follow up with your Primary Care Provider.   __________________________________________________________  The [facility] providers would like to encourage you to use [facility] to communicate with providers for non-urgent requests or questions.  Due to long hold times on the telephone, sending your provider a message by [facility] may be a faster and more efficient way to get a response.  Please allow 48 business hours for a response.  Please remember that this is for non-urgent requests.   RESTART:  Omeprazole one capsule 20 to 30 minutes before breakfast and 20 to 30 minutes before dinner.  Pepcid 41m at bedtime every night.  Altoids one to two tablets with every meal.  You will return to our office on an as needed basis.  Thank you for entrusting me with your care and [facility]  Dr JArdis Hughs

## 2020-07-15 NOTE — Progress Notes (Signed)
review of pertinent gastrointestinal problems: 1.Family history of colon cancer, his mother had colon cancer when she was 58. Adenomatous polyp 2008, small; repeat colonoscopy 01/2011 for minor rectal bleeding found small HP, hemorrhoids;.Colonoscopy June 2017 for dark stools Dr. Ardis Hughs found 3 subcentimeter polyps. The examination was otherwise normal. Pathology showed these were adenomatous polyps.Colonoscopy 12/2019 2 subcentimeter polyps were removed. Neither were adenomas. Recall colonoscopy should be 12/2024. 2. EGD normal2008 for chest discomfort.EGD June 2017 for dark stools, melena and itwas completely normal.EGD 12/2019 for mild iron deficiency anemia, dysphagia. Very friable eschar inproximal stomach, similar in appearance to a Cameron's erosion however no hiatal hernia present. Biopsy showed no sign of dysplasia or H. pylori infection. He was started on a proton pump inhibitor once daily 3. Intermittent abd pains 01/2012 CT, [place], CBC, cmet all essentially normal. EGD Dr. Ardis Hughs 02/2012 showed mild gastritis, H. plori neg 4.  Coughing after swallowing, January 2022 modified barium swallow study showed normal oropharyngeal swallowing.  Barium esophagram February 2022 showed "moderate esophageal dysmotility, likely presbyesophagus.  This results in contrast stasis throughout the esophagus." 5.  "Very severe obstructive sleep apnea" pulmonary note 2022   HPI: This is a very pleasant 75 year old man whom I last saw about 2 months ago   I last saw him about 2 months ago.  He had not tried to eat all toilet medicines which I had recommended previously.  I decided to maximize his medical antiacid therapy by increasing his proton pump inhibitor twice daily and he would continue taking his Pepcid at bedtime every night.  Also with his nonspecific motility disorder I recommended a trial of 2 Altoid tabs with every meal.  His weight is up 1 pound since his last office visit here,  same scale in our office  He was recently hospitalized with an exacerbation of asthma.  He has cough variant asthma documented on several of his notes.  He is pretty good about taking his antiacid medicine this on schedule.  Not perfect he admits.  He is on a lot of medicines and I understand it can just be hard sometimes to keep them all straight.  He also has been taking his altoids 1-2 tabs before every meal and he does think that this helps with his swallowing.   ROS: complete GI ROS as described in HPI, all other review negative.  Constitutional:  No unintentional weight loss   Past Medical History:  Diagnosis Date  . Allergy   . Asthma   . CAD (coronary artery disease)   . CHF (congestive heart failure) ([place])   . COPD (chronic obstructive pulmonary disease) ([place])   . Diabetes mellitus   . Fibromyalgia   . GERD (gastroesophageal reflux disease)   . GI bleeding   . Gout   . Hyperlipidemia   . Hypertension   . Hypogonadism male   . Insomnia   . MRSA cellulitis   . Neuropathy   . Obesity   . Shortness of breath dyspnea    with exertion   . Sleep apnea    cpap- 14   . Wheezing    no asthma diagnosis    Past Surgical History:  Procedure Laterality Date  . BACK SURGERY    . BIOPSY  12/20/2019   Procedure: BIOPSY;  Surgeon: Milus Banister, MD;  Location: [facility];  Service: Endoscopy;;  . CARDIAC CATHETERIZATION    . COLONOSCOPY    . COLONOSCOPY WITH PROPOFOL N/A 12/20/2019   Procedure: COLONOSCOPY WITH PROPOFOL;  Surgeon: Milus Banister, MD;  Location: [facility];  Service: Endoscopy;  Laterality: N/A;  . ESOPHAGOGASTRODUODENOSCOPY (EGD) WITH PROPOFOL N/A 12/20/2019   Procedure: ESOPHAGOGASTRODUODENOSCOPY (EGD) WITH PROPOFOL;  Surgeon: Milus Banister, MD;  Location: [facility];  Service: Endoscopy;  Laterality: N/A;  . LEFT HEART CATH AND CORONARY ANGIOGRAPHY N/A 12/02/2016   Procedure: LEFT HEART CATH AND CORONARY ANGIOGRAPHY;  Surgeon: Jettie Booze, MD;  Location: [facility];  Service: Cardiovascular;  Laterality: N/A;  . LUMBAR LAMINECTOMY/DECOMPRESSION MICRODISCECTOMY N/A 01/02/2014   Procedure: CENTRAL DECOMPRESSION LUMBAR LAMINECTOMY L3-L4, L4-L5;  Surgeon: Tobi Bastos, MD;  Location: [facility];  Service: Orthopedics;  Laterality: N/A;  . neck fusion    . POLYPECTOMY  12/20/2019   Procedure: POLYPECTOMY;  Surgeon: Milus Banister, MD;  Location: [facility];  Service: Endoscopy;;    Current Outpatient Medications  Medication Sig Dispense Refill  . albuterol (PROVENTIL) (2.5 MG/3ML) 0.083% nebulizer solution Take 3 mLs (2.5 mg total) by nebulization every 6 (six) hours as needed for wheezing or shortness of breath. 150 mL 1  . albuterol (VENTOLIN HFA) 108 (90 Base) MCG/ACT inhaler Inhale 2 puffs into the lungs every 6 (six) hours as needed for wheezing or shortness of breath. 1 each 2  . atorvastatin (LIPITOR) 40 MG tablet Take 40 mg by mouth daily. 1 Tablet Daily    . bisoprolol (ZEBETA) 5 MG tablet Take 1/2 (one-half) tablet by mouth once daily (Patient taking differently: Take 2.5 mg by mouth daily.) 45 tablet 1  . Budeson-Glycopyrrol-Formoterol (BREZTRI AEROSPHERE) 160-9-4.8 MCG/ACT AERO Inhale 2 puffs into the lungs in the morning and at bedtime. 42.8 g 4  . Cholecalciferol (VITAMIN D3) 125 MCG (5000 UT) TABS Take 5,000 Units by mouth daily.    . cloNIDine (CATAPRES) 0.2 MG tablet Take 1 tablet (0.2 mg total) by mouth 3 (three) times daily. (NEEDS TO BE SEEN BEFORE NEXT REFILL) 90 tablet 0  . colchicine 0.6 MG tablet Take 0.6 mg by mouth daily. As needed    . dapagliflozin propanediol (FARXIGA) 5 MG TABS tablet Take 1 tablet (5 mg total) by mouth daily. 90 tablet 3  . famotidine (PEPCID) 20 MG tablet Take 1 tablet (20 mg total) by mouth at bedtime. 30 tablet 3  . FEROSUL 325 (65 Fe) MG tablet Take 325 mg by mouth 3 (three) times daily.     . furosemide (LASIX) 40 MG tablet Take 40 mg by mouth daily as needed  (fluid retention.).    [name] hydrALAZINE (APRESOLINE) 50 MG tablet Take 1 tablet (50 mg total) by mouth 3 (three) times daily. 270 tablet 1  . isosorbide dinitrate (ISORDIL) 20 MG tablet Take 1 tablet (20 mg total) by mouth 3 (three) times daily. 90 tablet 1  . methylPREDNISolone (MEDROL DOSEPAK) 4 MG TBPK tablet Medrol Dosepak take as instructed 21 tablet 0  . MYRBETRIQ 25 MG TB24 tablet Take 1 tablet by mouth once daily 30 tablet 0  . nitroGLYCERIN (NITROSTAT) 0.4 MG SL tablet Place 1 tablet (0.4 mg total) under the tongue every 5 (five) minutes x 3 doses as needed for chest pain. 30 tablet 12  . omeprazole (PRILOSEC) 40 MG capsule Take 1 capsule (40 mg total) by mouth 2 (two) times daily at 8 am and 10 pm. Take 30-60 minutes prior to breakfast. 60 capsule 5  . polyethylene glycol (MIRALAX / GLYCOLAX) 17 g packet Take 17 g by mouth as needed for mild constipation.    . RESTASIS  0.05 % ophthalmic emulsion Place 1 drop into both eyes in the morning and at bedtime.    . sertraline (ZOLOFT) 100 MG tablet Take 1 tablet (100 mg total) by mouth daily. (NEEDS TO BE SEEN BEFORE NEXT REFILL) 30 tablet 0  . tamsulosin (FLOMAX) 0.4 MG CAPS capsule Take 1 capsule (0.4 mg total) by mouth daily after supper. 90 capsule 3   No current facility-administered medications for this visit.    Allergies as of 07/15/2020 - Review Complete 07/15/2020  Allergen Reaction Noted  . Amlodipine besy-benazepril hcl Swelling and Other (See Comments) 06/24/2010  . Oxycodone Itching 07/11/2017  . Phenergan [promethazine hcl] Other (See Comments) 06/12/2012  . Hydrocodone Itching 02/13/2013    Family History  Problem Relation Age of Onset  . Colon cancer Mother   . Diabetes Father        siblings  . Heart disease Father        brother  . Heart attack Father   . Kidney disease Sister   . Heart failure Sister   . Heart disease Brother   . Heart attack Son 28  . Drug abuse Sister   . Heart disease Brother   . Deep vein  thrombosis Brother   . Colon polyps Neg Hx     Social History   Socioeconomic History  . Marital status: Married    Spouse name: Butch Penny  . Number of children: 3  . Years of education: 8  . Highest education level: GED or equivalent  Occupational History  . 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 Smoking status: Former Smoker    Quit date: 02/08/1969    Years since quitting: 51.4  . Smokeless tobacco: Former Systems developer    Quit date: Pharmacologist  . Vaping Use: Never used  Substance and Sexual Activity  . Alcohol use: No    Alcohol/week: 0.0 standard drinks  . Drug use: No  . Sexual activity: Not Currently  Other Topics Concern  . Not on file  Social History Narrative   Drinks caffeine tea occasionally    Social Determinants of Health   Financial Resource Strain: Low Risk   . Difficulty of Paying Living Expenses: Not hard at all  Food Insecurity: No Food Insecurity  . Worried About Charity fundraiser in the Last Year: Never true  . Ran Out of Food in the Last Year: Never true  Transportation Needs: No Transportation Needs  . Lack of Transportation (Medical): No  . Lack of Transportation (Non-Medical): No  Physical Activity: Inactive  . Days of Exercise per Week: 0 days  . Minutes of Exercise per Session: 0 min  Stress: No Stress Concern Present  . Feeling of Stress : Not at all  Social Connections: Socially Integrated  . Frequency of Communication with Friends and Family: More than three times a week  . Frequency of Social Gatherings with Friends and Family: More than three times a week  . Attends Religious Services: More than 4 times per year  . Active Member of Clubs or Organizations: Yes  . Attends Archivist Meetings: More than 4 times per year  . Marital Status: Married  Human resources officer Violence: Not At Risk  . Fear of Current or Ex-Partner: No  . Emotionally Abused: No  . Physically Abused: No   . Sexually Abused: No     Physical Exam: BP (!) 150/90 (BP Location: Left Arm,  Patient Position: Sitting, Cuff Size: Normal)   Pulse 72   Ht 5' 6.25" (1.683 m) Comment: height measured without shoes  Wt 265 lb 6 oz (120.4 kg)   BMI 42.51 kg/m  Constitutional: generally well-appearing Psychiatric: alert and oriented x3 Abdomen: soft, nontender, nondistended, no obvious ascites, no peritoneal signs, normal bowel sounds No peripheral edema noted in lower extremities  Assessment and plan: 75 y.o. male with cough, nonspecific esophageal dysmotility, GERD  Think his biggest issues are his significant COPD and cough variant asthma.  I reiterated to him that the best way to be taking his antiacid medicines would be to take his Prilosec 20 to 30 minutes before breakfast and 20 to 30 minutes before dinner.  Also Pepcid at bedtime every night.  He will continue taking 1-2 altoids for his nonspecific motility disorder.  He will reach out to me if he has further questions or concerns, otherwise follow-up as needed.  Please see the "Patient Instructions" section for addition details about the plan.  Owens Loffler, MD [facility] 07/15/2020, 9:17 AM   Total time on date of encounter was 30 minutes (this included time spent preparing to see the patient reviewing records; obtaining and/or reviewing separately obtained history; performing a medically appropriate exam and/or evaluation; counseling and educating the patient and family if present; ordering medications, tests or procedures if applicable; and documenting clinical information in the health record).

## 2020-07-16 ENCOUNTER — Other Ambulatory Visit: Payer: Self-pay | Admitting: Family Medicine

## 2020-07-18 ENCOUNTER — Ambulatory Visit (INDEPENDENT_AMBULATORY_CARE_PROVIDER_SITE_OTHER): Payer: PPO | Admitting: *Deleted

## 2020-07-18 ENCOUNTER — Encounter: Payer: Self-pay | Admitting: *Deleted

## 2020-07-18 ENCOUNTER — Telehealth: Payer: Self-pay | Admitting: *Deleted

## 2020-07-18 DIAGNOSIS — I10 Essential (primary) hypertension: Secondary | ICD-10-CM

## 2020-07-18 DIAGNOSIS — J449 Chronic obstructive pulmonary disease, unspecified: Secondary | ICD-10-CM

## 2020-07-18 MED ORDER — HYDRALAZINE HCL 25 MG PO TABS: 75.0000 mg | ORAL_TABLET | Freq: Three times a day (TID) | ORAL | 0 refills | Status: DC

## 2020-07-18 NOTE — Telephone Encounter (Signed)
07/18/2020  Kenneth Fuller is still having SOB with exertion and is checking his O2 as needed. Reports some readings in the 80s and feels that he would benefit from oxygen. He would be happy with a floor concentrator for now if Dr [name] feels it is appropriate to order. He does have an appt with pulmonary in July to follow-up on sleep apnea and he contacted them after hosp discharge and they did not have any sooner appts available.   Forwarding to [facility] Clinical staff for review and assistance.  [name], BSN, RN-BC Embedded Chronic Care Manager [facility] Direct Dial: [phone]

## 2020-07-18 NOTE — Patient Instructions (Signed)
Visit Information  PATIENT GOALS:  Goals Addressed             This Visit's Progress    Manage Pulmonary Conditions   Not on track    Timeframe:  Long-Range Goal Priority:  Medium Start Date:    06/05/20                         Expected End Date:  06/05/21                      Follow-up: 08/05/20  Use CPAP each night Follow-up with pulmonologist in July 2022 as directed Talk with PCP about supplemental oxygen Continue to monitor pulse ox daily and PRN and to call PCP or pulmonologist with any readings outside of recommended range. Keep all medical appointments Seek appropriate medical attention for any new or worsening symptoms Take medications as prescribed Call [facility] as needed [phone]      Track and Manage My Blood Pressure-Hypertension   Not on track    Timeframe:  Long-Range Goal Priority:  Medium Start Date:   05/14/20                          Expected End Date:   05/14/21                      Follow Up Date 08/05/20    check blood pressure daily and as needed write blood pressure results in a log or diary Keep appointment with cardiologist on 08/04/20 Increase hydralazine to 86m TID per cardiologist's instructions Call cardiologist with any readings outside of recommended range Seek appropriate medical attention for any new or worsening symptoms Call RDuncanwith any nursing needs [phone]  Why is this important?   You won't feel high blood pressure, but it can still hurt your blood vessels.  High blood pressure can cause heart or kidney problems. It can also cause a stroke.  Making lifestyle changes like losing a little weight or eating less salt will help.  Checking your blood pressure at home and at different times of the day can help to control blood pressure.  If the doctor prescribes medicine remember to take it the way the doctor ordered.  Call the office if you cannot afford the medicine or if there are questions about it.     Notes:           Patient verbalizes understanding of instructions provided today and agrees to view in [facility]   Follow Up Plan:  Telephone follow up appointment with care management team member scheduled for: [facility] has been provided with contact information for the care management team and has been advised to call with any health related questions or concerns.   KChong Sicilian BSN, RN-BC Embedded Chronic Care Manager [facility] Direct Dial: [phone]

## 2020-07-18 NOTE — Chronic Care Management (AMB) (Signed)
Chronic Care Management   CCM RN Visit Note  07/18/2020 Name: Kenneth Fuller MRN: [id number] DOB: July 27, 1945  Subjective: Kenneth Fuller is a 75 y.o. year old male who is a primary care patient of [name], [name], MD. The care management team was consulted for assistance with disease management and care coordination needs.    Engaged with patient by telephone for follow up visit in response to provider referral for case management and/or care coordination services.   Consent to Services:  The patient was given information about Chronic Care Management services, agreed to services, and gave verbal consent prior to initiation of services.  Please see initial visit note for detailed documentation.   Patient agreed to services and verbal consent obtained.   Assessment: Review of patient past medical history, allergies, medications, health status, including review of consultants reports, laboratory and other test data, was performed as part of comprehensive evaluation and provision of chronic care management services.   SDOH (Social Determinants of Health) assessments and interventions performed:    CCM Care Plan  Allergies  Allergen Reactions   Amlodipine Besy-Benazepril Hcl Swelling and Other (See Comments)    Makes tongue swell (lotrel)   Oxycodone Itching   Phenergan [Promethazine Hcl] Other (See Comments)    "I can't remember."   Hydrocodone Itching    Can tolerate in low doses    Outpatient Encounter Medications as of 07/18/2020  Medication Sig Note   albuterol (PROVENTIL) (2.5 MG/3ML) 0.083% nebulizer solution Take 3 mLs (2.5 mg total) by nebulization every 6 (six) hours as needed for wheezing or shortness of breath.    albuterol (VENTOLIN HFA) 108 (90 Base) MCG/ACT inhaler Inhale 2 puffs into the lungs every 6 (six) hours as needed for wheezing or shortness of breath.    atorvastatin (LIPITOR) 40 MG tablet Take 40 mg by mouth daily. 1 Tablet Daily    bisoprolol  (ZEBETA) 5 MG tablet Take 1/2 (one-half) tablet by mouth once daily (Patient taking differently: Take 2.5 mg by mouth daily.)    Budeson-Glycopyrrol-Formoterol (BREZTRI AEROSPHERE) 160-9-4.8 MCG/ACT AERO Inhale 2 puffs into the lungs in the morning and at bedtime.    Cholecalciferol (VITAMIN D3) 125 MCG (5000 UT) TABS Take 5,000 Units by mouth daily.    cloNIDine (CATAPRES) 0.2 MG tablet Take 1 tablet (0.2 mg total) by mouth 3 (three) times daily. (NEEDS TO BE SEEN BEFORE NEXT REFILL)    colchicine 0.6 MG tablet Take 0.6 mg by mouth daily. As needed    dapagliflozin propanediol (FARXIGA) 5 MG TABS tablet Take 1 tablet (5 mg total) by mouth daily.    famotidine (PEPCID) 20 MG tablet Take 1 tablet (20 mg total) by mouth at bedtime.    FEROSUL 325 (65 Fe) MG tablet Take 325 mg by mouth 3 (three) times daily.     furosemide (LASIX) 40 MG tablet Take 40 mg by mouth daily as needed (fluid retention.).    isosorbide dinitrate (ISORDIL) 20 MG tablet Take 1 tablet (20 mg total) by mouth 3 (three) times daily.    methylPREDNISolone (MEDROL DOSEPAK) 4 MG TBPK tablet Medrol Dosepak take as instructed    MYRBETRIQ 25 MG TB24 tablet Take 1 tablet by mouth once daily    nitroGLYCERIN (NITROSTAT) 0.4 MG SL tablet Place 1 tablet (0.4 mg total) under the tongue every 5 (five) minutes x 3 doses as needed for chest pain. 07/15/2020: On hand   omeprazole (PRILOSEC) 40 MG capsule Take 1 capsule (40 mg total) by  mouth 2 (two) times daily at 8 am and 10 pm. Take 30-60 minutes prior to breakfast.    polyethylene glycol (MIRALAX / GLYCOLAX) 17 g packet Take 17 g by mouth as needed for mild constipation.    RESTASIS 0.05 % ophthalmic emulsion Place 1 drop into both eyes in the morning and at bedtime.    sertraline (ZOLOFT) 100 MG tablet Take 1 tablet (100 mg total) by mouth daily. (NEEDS TO BE SEEN BEFORE NEXT REFILL)    tamsulosin (FLOMAX) 0.4 MG CAPS capsule Take 1 capsule (0.4 mg total) by mouth daily after supper.     [DISCONTINUED] hydrALAZINE (APRESOLINE) 50 MG tablet Take 1 tablet (50 mg total) by mouth 3 (three) times daily.    No facility-administered encounter medications on file as of 07/18/2020.    Patient Active Problem List   Diagnosis Date Noted   Asthma exacerbation 07/09/2020   Chest pain 06/06/2020   Right upper lobe pneumonia 05/07/2020   Irritable larynx 05/07/2020   AKI (acute kidney injury) ([place]) 06/05/2019   Hyperkalemia 06/05/2019   Acute renal failure (ARF) ([place]) 06/04/2019   Dizziness 06/04/2019   Near syncope 06/04/2019   Insomnia    CHF (congestive heart failure) ([place]) 01/06/2018   Depression with anxiety 01/04/2018   CKD (chronic kidney disease), stage III ([place]) 01/04/2018   Chronic obstructive pulmonary disease ([place]) 01/04/2018   Depression, recurrent ([place]) 11/11/2017   Chronic diastolic heart failure ([place]) 04/13/2017   Swelling of lower extremity 11/30/2016   Obesity, Class III, BMI 40-49.9 (morbid obesity) ([place]) 08/21/2015   Coronary artery disease involving native coronary artery of native heart without angina pectoris 12/05/2014   Urinary urgency 10/15/2014   Spinal stenosis, lumbar region, with neurogenic claudication 01/02/2014   Hyperlipidemia associated with type 2 diabetes mellitus ([place]) 10/25/2013   COLONIC POLYPS, ADENOMATOUS 02/25/2007   Diabetes mellitus type II, non insulin dependent ([place]) 02/25/2007   Gout 02/25/2007   Essential hypertension 02/25/2007   Cough variant asthma 02/25/2007   OSA on CPAP 02/25/2007   FATIGUE, CHRONIC 02/25/2007   HEADACHE, CHRONIC 02/25/2007   GERD (gastroesophageal reflux disease) 02/25/2007    Conditions to be addressed/monitored:HTN and Pulmonary Disease  Care Plan : RNCM: Respiratory Disorder  Updates made by Ilean China, RN since 07/18/2020 12:00 AM     Problem: Chronic Obstructive Asthma/COPD & OSA   Priority: Medium     Long-Range Goal: Manage Pulmonary Conditions   Start Date: 06/05/2020  This Visit's  Progress: Not on track  Recent Progress: On track  Priority: Medium  Note:   Current Barriers:  Chronic Disease Management support and education needs related to chronic obstructive asthma/COPD and obstructive sleep apnea Does not have oxygen  Nurse Case Manager Clinical Goal(s):  patient will work with pulmonologist to address needs related to medical management of pulmonary conditions patient will meet with RN Care Manager to address self-management of pulmonary conditions Patient will work with PCP or pulmonologist regarding potential need for supplemental oxgyen Patient will continue to check and record O2 levels and will seek appropriate medical attention for readings outside of recommended range  Interventions:  1:1 collaboration with Claretta Fraise, MD regarding development and update of comprehensive plan of care as evidenced by provider attestation and co-signature Inter-disciplinary care team collaboration (see longitudinal plan of care) Evaluation of current treatment plan related to pulmonary conditions and patient's adherence to plan as established by provider. Chart reviewed including relevant office notes and lab results Discussed use of oxygen and CPAP  at night. Supposed to be getting a bipap but it is on backorder.  Does not have oxygen that he wears during the day. Interested in a portable oxygen concentrator or any supplemental oxygen at this point. Wearing CPAP nightly Reviewed and discussed medications Compliant with medications Previously advised to f/u with pulmonary regarding oxygen needs and order for POC if necessary but he does not have an appt until July.  Discussed recent hospitalization and follow-up with PCP Telephone message sent to PCP regarding patient's desire for supplemental oxygen. Requested that he consider ordering this and having [facility] clinical staff to reach out to patient with response.  Reviewed upcoming appointments Attempted to schedule sooner  appt with pulmonary but there were none available. He is scheduled for July.  Encouraged to continue monitoring oxygen with pulse ox daily and PRN Discussed plans with patient for ongoing care management follow up and provided patient with direct contact information for care management team Provided with RNCM contact number and encouraged to reach out as needed  Self Care Activities:  Self administers medications as prescribed Performs ADL's independently Performs IADL's independently Calls provider office for new concerns or questions  Patient Goals Over the next 30 days, patient will: Use CPAP each night Follow-up with pulmonologist in July 2022 as directed Talk with PCP about supplemental oxygen Continue to monitor pulse ox daily and PRN and to call PCP or pulmonologist with any readings outside of recommended range. Keep all medical appointments Seek appropriate medical attention for any new or worsening symptoms Take medications as prescribed Call [facility] as needed [phone]  Follow Up Plan:  Telephone follow up appointment with care management team member scheduled for: 08/05/20 with [facility] The patient has been provided with contact information for the care management team and has been advised to call with any health related questions or concerns.     Care Plan : RNCM: Hypertension (Adult)  Updates made by Ilean China, RN since 07/18/2020 12:00 AM     Problem: Hypertension (Hypertension)   Priority: Medium     Long-Range Goal: Hypertension Managed   Start Date: 05/14/2020  This Visit's Progress: Not on track  Recent Progress: Not on track  Priority: Medium  Note:   Objective:  BP Readings from Last 3 Encounters:  07/15/20 (!) 150/90  07/14/20 (!) 141/89  07/10/20 (!) 162/90   Current Barriers:  Knowledge Deficits related to uncontrolled hypertension Chronic Disease Management support and education needs related to hypertension  Case Manager Clinical Goal(s):   patient will demonstrate improved adherence to prescribed treatment plan for hypertension as evidenced by taking all medications as prescribed, monitoring and recording blood pressure as directed, adhering to low sodium/DASH diet patient will demonstrate improved health management independence as evidenced by checking blood pressure as directed and notifying PCP if SBP>160 or DBP > 90, taking all medications as prescribe, and adhering to a low sodium diet as discussed. Patient will work with cardiologist regarding medical management of hypertension Patient will work with RN Care Manager regarding self management of hypertension  Interventions:  Collaboration with [name], [name], MD regarding development and update of comprehensive plan of care as evidenced by provider attestation and co-signature Inter-disciplinary care team collaboration (see longitudinal plan of care) Evaluation of current treatment plan related to hypertension self management and patient's adherence to plan as established by provider. Reviewed medications with patient and verified compliance Discussed plans with patient for ongoing care management follow up and provided patient with direct contact information for care  management team Advised patient, providing education and rationale, to monitor blood pressure daily and record, calling PCP for findings outside established parameters.  Discussed recent home blood pressure readings Persistently elevated with readings up to 180/110 Averaging around 150/90 RNCM collaborated with cardiologist, Dr Sallyanne Kuster regarding persistent elevations and he responded with a plan to increase hydralazine to 57m TID until his office visit on 08/04/20. Cardio nursing staff will reach out to patient to advise.  Reviewed upcoming appointments and encouraged to seek appropriate medical attention as needed Encouraged patient to reach out to cardiologist with any new or worsening symptoms or to seek  emergency medical attention as necessary Enc[facility]  Self-Care Activities: Self administers medications as prescribed Attends all scheduled provider appointments Calls provider office for new concerns, questions, or BP outside discussed parameters Checks BP and records as discussed  Patient Goals: check blood pressure daily and as needed write blood pressure results in a log or diary Keep appointment with cardiologist on 08/04/20 Increase hydralazine to 75mTID per cardiologist's instructions Call cardiologist with any readings outside of recommended range Seek appropriate medical attention for any new or worsening symptoms Call [facility] any nursing needs [phone] Up Plan:  Telephone follow up appointment with care management team member [facility] has been provided with contact information for the care management team and has been advised to call with any health related questions or concerns.       KrChong SicilianBSN, RN-BC Embedded Chronic Care Manager [facility] Direct Dial: [phone]

## 2020-07-18 NOTE — Telephone Encounter (Signed)
Patient will need to do oxygen testing in office so we can qualify him for oxygen.  We cannot qualify it off of his readings.

## 2020-07-18 NOTE — Telephone Encounter (Signed)
Called the patient per the staff message from Dr. Sallyanne Kuster. The patient has asked that we send in 25 mg tablets so that he will not have to split any pills.   We will ask him to increase his hydralazine to 75 mg 3 times daily between now and his follow-up appointment on June 27.

## 2020-07-21 ENCOUNTER — Ambulatory Visit: Payer: PPO | Admitting: Licensed Clinical Social Worker

## 2020-07-21 DIAGNOSIS — J449 Chronic obstructive pulmonary disease, unspecified: Secondary | ICD-10-CM | POA: Diagnosis not present

## 2020-07-21 DIAGNOSIS — N1832 Chronic kidney disease, stage 3b: Secondary | ICD-10-CM

## 2020-07-21 DIAGNOSIS — F418 Other specified anxiety disorders: Secondary | ICD-10-CM | POA: Diagnosis not present

## 2020-07-21 DIAGNOSIS — I10 Essential (primary) hypertension: Secondary | ICD-10-CM

## 2020-07-21 DIAGNOSIS — I5042 Chronic combined systolic (congestive) and diastolic (congestive) heart failure: Secondary | ICD-10-CM | POA: Diagnosis not present

## 2020-07-21 DIAGNOSIS — E1142 Type 2 diabetes mellitus with diabetic polyneuropathy: Secondary | ICD-10-CM

## 2020-07-21 DIAGNOSIS — I2510 Atherosclerotic heart disease of native coronary artery without angina pectoris: Secondary | ICD-10-CM

## 2020-07-21 NOTE — Patient Instructions (Signed)
Visit Information  PATIENT GOALS:  Goals Addressed             This Visit's Progress    manage health issues of client; manage anxiety issues of client       Timeframe:  Short Term Goal Priority:  Medium Progress; On Track  Start Date: 07/21/20                             Expected End Date: 10/21/20                Follow Up Date 09/02/20   Protect My Health (Patient)  Manage Health issues of client; manage anxiety issues of client    Why is this important?   Screening tests can find diseases early when they are easier to treat.  Your doctor or nurse will talk with you about which tests are important for you.  Getting shots for common diseases like the flu and shingles will help prevent them.     Patient Coping Skills/Strengths: Has family support Has no transport issues Attends scheduled medical appointments Takes medications as prescribed Completes ADLs independently  Patient Deficits: Some mobility challenges Pain issues Breathing challenges  Patient Goals:  Patient will attend all scheduled client medical appointments in next 30 days Patient will call RNCM or LCSW as needed for CCM support in next 30 days Patient will communicate regularly with his spouse in next 30 days to discuss needs of client  Follow Up Plan:  LCSW to call client or his spouse on 09/02/20            [name].Shacoria Latif MSW, LCSW Licensed Clinical Social Worker [facility] Care Management [phone]

## 2020-07-21 NOTE — Chronic Care Management (AMB) (Signed)
Chronic Care Management    Clinical Social Work Note  07/21/2020 Name: Kenneth Fuller MRN: [id number] DOB: 07/03/1945  Kenneth Fuller is a 75 y.o. year old male who is a primary care patient of [name], [name], MD. The CCM team was consulted to assist the patient with chronic disease management and/or care coordination needs related to: [name] .   Engaged with patient by telephone for follow up visit in response to provider referral for social work chronic care management and care coordination services.   Consent to Services:  The patient was given information about Chronic Care Management services, agreed to services, and gave verbal consent prior to initiation of services.  Please see initial visit note for detailed documentation.   Patient agreed to services and consent obtained.   Assessment: Review of patient past medical history, allergies, medications, and health status, including review of relevant consultants reports was performed today as part of a comprehensive evaluation and provision of chronic care management and care coordination services.     SDOH (Social Determinants of Health) assessments and interventions performed:  SDOH Interventions    Flowsheet Row Most Recent Value  SDOH Interventions   Depression Interventions/Treatment  Currently on Treatment        Advanced Directives Status: See Vynca application for related entries.  CCM Care Plan  Allergies  Allergen Reactions   Amlodipine Besy-Benazepril Hcl Swelling and Other (See Comments)    Makes tongue swell (lotrel)   Oxycodone Itching   Phenergan [Promethazine Hcl] Other (See Comments)    "I can't remember."   Hydrocodone Itching    Can tolerate in low doses    Outpatient Encounter Medications as of 07/21/2020  Medication Sig Note   albuterol (PROVENTIL) (2.5 MG/3ML) 0.083% nebulizer solution Take 3 mLs (2.5 mg total) by nebulization every 6 (six) hours as needed for wheezing or  shortness of breath.    albuterol (VENTOLIN HFA) 108 (90 Base) MCG/ACT inhaler Inhale 2 puffs into the lungs every 6 (six) hours as needed for wheezing or shortness of breath.    atorvastatin (LIPITOR) 40 MG tablet Take 40 mg by mouth daily. 1 Tablet Daily    bisoprolol (ZEBETA) 5 MG tablet Take 1/2 (one-half) tablet by mouth once daily (Patient taking differently: Take 2.5 mg by mouth daily.)    Budeson-Glycopyrrol-Formoterol (BREZTRI AEROSPHERE) 160-9-4.8 MCG/ACT AERO Inhale 2 puffs into the lungs in the morning and at bedtime.    Cholecalciferol (VITAMIN D3) 125 MCG (5000 UT) TABS Take 5,000 Units by mouth daily.    cloNIDine (CATAPRES) 0.2 MG tablet Take 1 tablet (0.2 mg total) by mouth 3 (three) times daily. (NEEDS TO BE SEEN BEFORE NEXT REFILL)    colchicine 0.6 MG tablet Take 0.6 mg by mouth daily. As needed    dapagliflozin propanediol (FARXIGA) 5 MG TABS tablet Take 1 tablet (5 mg total) by mouth daily.    famotidine (PEPCID) 20 MG tablet Take 1 tablet (20 mg total) by mouth at bedtime.    FEROSUL 325 (65 Fe) MG tablet Take 325 mg by mouth 3 (three) times daily.     furosemide (LASIX) 40 MG tablet Take 40 mg by mouth daily as needed (fluid retention.).    hydrALAZINE (APRESOLINE) 25 MG tablet Take 3 tablets (75 mg total) by mouth 3 (three) times daily.    isosorbide dinitrate (ISORDIL) 20 MG tablet Take 1 tablet (20 mg total) by mouth 3 (three) times daily.    methylPREDNISolone (MEDROL DOSEPAK) 4 MG TBPK  tablet Medrol Dosepak take as instructed    MYRBETRIQ 25 MG TB24 tablet Take 1 tablet by mouth once daily    nitroGLYCERIN (NITROSTAT) 0.4 MG SL tablet Place 1 tablet (0.4 mg total) under the tongue every 5 (five) minutes x 3 doses as needed for chest pain. 07/15/2020: On hand   omeprazole (PRILOSEC) 40 MG capsule Take 1 capsule (40 mg total) by mouth 2 (two) times daily at 8 am and 10 pm. Take 30-60 minutes prior to breakfast.    polyethylene glycol (MIRALAX / GLYCOLAX) 17 g packet Take 17  g by mouth as needed for mild constipation.    RESTASIS 0.05 % ophthalmic emulsion Place 1 drop into both eyes in the morning and at bedtime.    sertraline (ZOLOFT) 100 MG tablet Take 1 tablet (100 mg total) by mouth daily. (NEEDS TO BE SEEN BEFORE NEXT REFILL)    tamsulosin (FLOMAX) 0.4 MG CAPS capsule Take 1 capsule (0.4 mg total) by mouth daily after supper.    No facility-administered encounter medications on file as of 07/21/2020.    Patient Active Problem List   Diagnosis Date Noted   Asthma exacerbation 07/09/2020   Chest pain 06/06/2020   Right upper lobe pneumonia 05/07/2020   Irritable larynx 05/07/2020   AKI (acute kidney injury) ([place]) 06/05/2019   Hyperkalemia 06/05/2019   Acute renal failure (ARF) ([place]) 06/04/2019   Dizziness 06/04/2019   Near syncope 06/04/2019   Insomnia    CHF (congestive heart failure) ([place]) 01/06/2018   Depression with anxiety 01/04/2018   CKD (chronic kidney disease), stage III ([place]) 01/04/2018   Chronic obstructive pulmonary disease ([place]) 01/04/2018   Depression, recurrent ([place]) 11/11/2017   Chronic diastolic heart failure ([place]) 04/13/2017   Swelling of lower extremity 11/30/2016   Obesity, Class III, BMI 40-49.9 (morbid obesity) ([place]) 08/21/2015   Coronary artery disease involving native coronary artery of native heart without angina pectoris 12/05/2014   Urinary urgency 10/15/2014   Spinal stenosis, lumbar region, with neurogenic claudication 01/02/2014   Hyperlipidemia associated with type 2 diabetes mellitus ([place]) 10/25/2013   COLONIC POLYPS, ADENOMATOUS 02/25/2007   Diabetes mellitus type II, non insulin dependent ([place]) 02/25/2007   Gout 02/25/2007   Essential hypertension 02/25/2007   Cough variant asthma 02/25/2007   OSA on CPAP 02/25/2007   FATIGUE, CHRONIC 02/25/2007   HEADACHE, CHRONIC 02/25/2007   GERD (gastroesophageal reflux disease) 02/25/2007    Conditions to be addressed/monitored: Monitor client management of health needs  faced  Care Plan : LCSW Care plan  Updates made by Katha Cabal, LCSW since 07/21/2020 12:00 AM     Problem: Coping Skills (General Plan of Care)      Goal: Manage Health needs and manage anxiety or stress issues related to health needs   Start Date: 07/21/2020  Expected End Date: 10/21/2020  This Visit's Progress: On track  Recent Progress: On track  Priority: Medium  Note:   Current barriers:   Patient in need of assistance with connecting to community resources for possible assistance in managing his ongoing medical needs Patient is unable to independently navigate community resource options without care coordination support Mobility issues Pain issues  Clinical Goals:  patient will work with SW in next 30 days to address concerns related to health issues of client and management of health issues of client Patient will call RNCM or LCSW as needed in next 30 days for CCM support Patient will attend all scheduled client medical appointments in next 30 days  Clinical Interventions:  Collaboration with [name], [name], MD regarding development and update of comprehensive plan of care as evidenced by provider attestation and co-signature Assessment of needs, barriers  of client  Talked with client about breathing issues of client (client said he uses inhaler as needed and uses nebulizer for treatments as prescribed) Talked with client about vision issues of client Talked with client about support with RNCM Talked with client about ADLs completion Talked with client about sleeping issues of client Encouraged client to call [facility] Triage nurse as needed for nursing support Talked with client about his upcoming appointment on August 14, 2020 with pulmonologist Talked with client about his recent appointment with Dr. Warrick Parisian and about his next appointment with Dr. Warrick Parisian Talked with client about oxygen issues; he said he did not have a "reliable" (his words) source of oxygen in  his home.  Still interested in talking with PCP or Pulmonologist about possible portable oxygen system for his use Talked with client about his recent hospitalization and care received Provided active listening for client Talked with client about family support (has support from his wife and daughter) Talked with client sadly about recent death of his son. LCSW and client talked of client adjustment to this loss of his son Provided some counseling support for client  Patient Coping Skills/Strengths: Has family support Has no transport issues Attends scheduled medical appointments Takes medications as prescribed Completes ADLs independently  Patient Deficits: Some mobility challenges Pain issues Breathing challenges  Patient Goals:  Patient will attend all scheduled client medical appointments in next 30 days Patient will call RNCM or LCSW as needed for CCM support in next 30 days Patient will communicate regularly with his spouse in next 30 days to discuss needs of client  Follow Up Plan:  LCSW to call client or his spouse on 09/02/20     [name].Alinna Siple MSW, LCSW Licensed Clinical Social Worker [facility] Care Management [phone]

## 2020-07-23 NOTE — Telephone Encounter (Signed)
Pt is scheduled for 6/21 at 9:10 to see [name]. Did not cancel July appt just in case it is needed.

## 2020-07-25 ENCOUNTER — Other Ambulatory Visit: Payer: Self-pay

## 2020-07-25 ENCOUNTER — Telehealth: Payer: Self-pay | Admitting: Family Medicine

## 2020-07-25 ENCOUNTER — Other Ambulatory Visit: Payer: Self-pay | Admitting: Family Medicine

## 2020-07-25 DIAGNOSIS — E1142 Type 2 diabetes mellitus with diabetic polyneuropathy: Secondary | ICD-10-CM

## 2020-07-25 NOTE — Telephone Encounter (Signed)
Faxed order for glucometer to [facility].  Test strips were sent earlier today.

## 2020-07-25 NOTE — Telephone Encounter (Signed)
Pt is asking for a new meter because the one he has is not working properly  Pt does not understand why he runs out of strips if he is a diabetic and needs refills on file.   Please call back to explain why he does not.  Use [facility]

## 2020-07-28 ENCOUNTER — Telehealth: Payer: Self-pay | Admitting: Primary Care

## 2020-07-28 ENCOUNTER — Other Ambulatory Visit: Payer: Self-pay | Admitting: Family Medicine

## 2020-07-28 DIAGNOSIS — G4733 Obstructive sleep apnea (adult) (pediatric): Secondary | ICD-10-CM | POA: Diagnosis not present

## 2020-07-28 DIAGNOSIS — I5032 Chronic diastolic (congestive) heart failure: Secondary | ICD-10-CM | POA: Diagnosis not present

## 2020-07-28 DIAGNOSIS — F339 Major depressive disorder, recurrent, unspecified: Secondary | ICD-10-CM

## 2020-07-28 DIAGNOSIS — R0902 Hypoxemia: Secondary | ICD-10-CM | POA: Diagnosis not present

## 2020-07-29 ENCOUNTER — Ambulatory Visit (INDEPENDENT_AMBULATORY_CARE_PROVIDER_SITE_OTHER): Payer: PPO

## 2020-07-29 ENCOUNTER — Telehealth: Payer: Self-pay | Admitting: Primary Care

## 2020-07-29 ENCOUNTER — Other Ambulatory Visit: Payer: Self-pay

## 2020-07-29 ENCOUNTER — Encounter: Payer: Self-pay | Admitting: Family Medicine

## 2020-07-29 ENCOUNTER — Ambulatory Visit (INDEPENDENT_AMBULATORY_CARE_PROVIDER_SITE_OTHER): Payer: PPO | Admitting: Family Medicine

## 2020-07-29 VITALS — BP 158/79 | HR 79 | Temp 97.0°F | Ht 66.0 in | Wt 270.0 lb

## 2020-07-29 DIAGNOSIS — R0602 Shortness of breath: Secondary | ICD-10-CM | POA: Diagnosis not present

## 2020-07-29 DIAGNOSIS — J441 Chronic obstructive pulmonary disease with (acute) exacerbation: Secondary | ICD-10-CM | POA: Diagnosis not present

## 2020-07-29 DIAGNOSIS — R059 Cough, unspecified: Secondary | ICD-10-CM | POA: Diagnosis not present

## 2020-07-29 MED ORDER — AMOXICILLIN-POT CLAVULANATE 875-125 MG PO TABS
1.0000 | ORAL_TABLET | Freq: Two times a day (BID) | ORAL | 0 refills | Status: DC
Start: 2020-07-29 — End: 2020-08-19

## 2020-07-29 MED ORDER — PREDNISONE 20 MG PO TABS: ORAL_TABLET | ORAL | 0 refills | Status: DC

## 2020-07-29 NOTE — Telephone Encounter (Signed)
error 

## 2020-07-29 NOTE — Progress Notes (Signed)
BP (!) 158/79   Pulse 79   Temp (!) 97 F (36.1 C)   Ht _0  (1.676 m)   Wt 270 lb (122.5 kg)   SpO2 94%   BMI 43.58 kg/m    Subjective:   Patient ID: Kenneth Fuller, male    DOB: 05/11/1945, 75 y.o.   MRN: [id number]  HPI: Kenneth Fuller is a 75 y.o. male presenting on 07/29/2020 for oxygen saturation testing and Cough   HPI Shortness of breath, COPD and CHF Patient is coming in for shortness of breath and wants to be tested for oxygen.  Sent here with him.  Where he was using his brothers oxygen but turned that back in does not have oxygen currently and is having a lot of shortness of breath and coughing spells still.  He does have a little bit of swelling in his legs.  He has been alternating between pneumonia and congestive heart failure and COPD exacerbations recently over the past couple years.  It seems to be happening more frequently.  He does have both cardiology and pulmonology  Relevant past medical, surgical, family and social history reviewed and updated as indicated. Interim medical history since our last visit reviewed. Allergies and medications reviewed and updated.  Review of Systems  Constitutional:  Negative for chills and fever.  HENT:  Positive for congestion, postnasal drip, rhinorrhea and sinus pressure. Negative for ear discharge, ear pain, sneezing, sore throat and voice change.   Eyes:  Negative for pain, discharge, redness and visual disturbance.  Respiratory:  Positive for cough, shortness of breath and wheezing.   Cardiovascular:  Positive for leg swelling. Negative for chest pain.  Musculoskeletal:  Negative for gait problem.  Skin:  Negative for rash.  All other systems reviewed and are negative.  Per HPI unless specifically indicated above   Allergies as of 07/29/2020       Reactions   Amlodipine Besy-benazepril Hcl Swelling, Other (See Comments)   Makes tongue swell (lotrel)   Oxycodone Itching   Phenergan [promethazine Hcl] Other  (See Comments)   "I can't remember."   Hydrocodone Itching   Can tolerate in low doses        Medication List        Accurate as of July 29, 2020  9:58 AM. If you have any questions, ask your nurse or doctor.          albuterol 108 (90 Base) MCG/ACT inhaler Commonly known as: VENTOLIN HFA Inhale 2 puffs into the lungs every 6 (six) hours as needed for wheezing or shortness of breath.   albuterol (2.5 MG/3ML) 0.083% nebulizer solution Commonly known as: PROVENTIL Take 3 mLs (2.5 mg total) by nebulization every 6 (six) hours as needed for wheezing or shortness of breath.   amoxicillin-clavulanate 875-125 MG tablet Commonly known as: AUGMENTIN Take 1 tablet by mouth 2 (two) times daily. Started by: [name] Andra Matsuo, MD   atorvastatin 40 MG tablet Commonly known as: LIPITOR Take 40 mg by mouth daily. 1 Tablet Daily   bisoprolol 5 MG tablet Commonly known as: ZEBETA Take 1/2 (one-half) tablet by mouth once daily What changed: See the new instructions.   Breztri Aerosphere 160-9-4.8 MCG/ACT Aero Generic drug: Budeson-Glycopyrrol-Formoterol Inhale 2 puffs into the lungs in the morning and at bedtime.   cloNIDine 0.2 MG tablet Commonly known as: CATAPRES Take 1 tablet (0.2 mg total) by mouth 3 (three) times daily.   colchicine 0.6 MG tablet Take 0.6 mg by  mouth daily. As needed   dapagliflozin propanediol 5 MG Tabs tablet Commonly known as: Farxiga Take 1 tablet (5 mg total) by mouth daily.   famotidine 20 MG tablet Commonly known as: PEPCID Take 1 tablet (20 mg total) by mouth at bedtime.   FeroSul 325 (65 FE) MG tablet Generic drug: ferrous sulfate Take 325 mg by mouth 3 (three) times daily.   furosemide 40 MG tablet Commonly known as: LASIX Take 40 mg by mouth daily as needed (fluid retention.).   hydrALAZINE 25 MG tablet Commonly known as: APRESOLINE Take 3 tablets (75 mg total) by mouth 3 (three) times daily.   isosorbide dinitrate 20 MG  tablet Commonly known as: ISORDIL Take 1 tablet (20 mg total) by mouth 3 (three) times daily.   methylPREDNISolone 4 MG Tbpk tablet Commonly known as: MEDROL DOSEPAK Medrol Dosepak take as instructed   Myrbetriq 25 MG Tb24 tablet Generic drug: mirabegron ER Take 1 tablet by mouth once daily   nitroGLYCERIN 0.4 MG SL tablet Commonly known as: NITROSTAT Place 1 tablet (0.4 mg total) under the tongue every 5 (five) minutes x 3 doses as needed for chest pain.   omeprazole 40 MG capsule Commonly known as: PRILOSEC Take 1 capsule (40 mg total) by mouth 2 (two) times daily at 8 am and 10 pm. Take 30-60 minutes prior to breakfast.   OneTouch Ultra test strip Generic drug: glucose blood USE 1 STRIP TO CHECK GLUCOSE ONCE DAILY   polyethylene glycol 17 g packet Commonly known as: MIRALAX / GLYCOLAX Take 17 g by mouth as needed for mild constipation.   predniSONE 20 MG tablet Commonly known as: DELTASONE Take 3 tabs daily for 1 week, then 2 tabs daily for week 2, then 1 tab daily for week 3. Started by: [name] Melaysia Streed, MD   Restasis 0.05 % ophthalmic emulsion Generic drug: cycloSPORINE Place 1 drop into both eyes in the morning and at bedtime.   sertraline 100 MG tablet Commonly known as: ZOLOFT Take 1 tablet (100 mg total) by mouth daily.   tamsulosin 0.4 MG Caps capsule Commonly known as: FLOMAX Take 1 capsule (0.4 mg total) by mouth daily after supper.   Vitamin D3 125 MCG (5000 UT) Tabs Take 5,000 Units by mouth daily.         Objective:   BP (!) 158/79   Pulse 79   Temp (!) 97 F (36.1 C)   Ht _0  (1.676 m)   Wt 270 lb (122.5 kg)   SpO2 94%   BMI 43.58 kg/m   Wt Readings from Last 3 Encounters:  07/29/20 270 lb (122.5 kg)  07/15/20 265 lb 6 oz (120.4 kg)  07/14/20 265 lb 2 oz (120.3 kg)    Physical Exam Vitals and nursing note reviewed.  Constitutional:      General: He is not in acute distress.    Appearance: He is well-developed. He is not  diaphoretic.  Eyes:     General: No scleral icterus.    Conjunctiva/sclera: Conjunctivae normal.  Neck:     Thyroid: No thyromegaly.  Cardiovascular:     Rate and Rhythm: Normal rate.     Heart sounds: Normal heart sounds. No murmur heard. Pulmonary:     Effort: Pulmonary effort is normal. No respiratory distress.     Breath sounds: No stridor. Wheezing and rhonchi present. No rales.  Musculoskeletal:        General: Normal range of motion.     Cervical back: Neck  supple.  Lymphadenopathy:     Cervical: No cervical adenopathy.  Skin:    General: Skin is warm and dry.     Findings: No rash.  Neurological:     Mental Status: He is alert and oriented to person, place, and time.     Coordination: Coordination normal.  Psychiatric:        Behavior: Behavior normal.    Patient's O2 saturation testing showed that he was 93 to 95% on room air and when ambulating he stayed at 94% and did not require oxygen in the office here  Assessment & Plan:   Problem List Items Addressed This Visit   None Visit Diagnoses     Shortness of breath    -  Primary   Relevant Orders   Pulse oximetry, overnight   DG Chest 2 View   COPD exacerbation (HCC)       Relevant Medications   amoxicillin-clavulanate (AUGMENTIN) 875-125 MG tablet   predniSONE (DELTASONE) 20 MG tablet     Chest x-ray: No acute cardiopulmonary abnormalities, no consolidation or fluid  Likely COPD exacerbation, do not see any signs of pneumonia and do not see any signs of fluid in his lungs.  Will treat like COPD exacerbation with amoxicillin and prednisone.  Follow up plan: Return if symptoms worsen or fail to improve.  Counseling provided for all of the vaccine components Orders Placed This Encounter  Procedures   DG Chest 2 View   Pulse oximetry, overnight    Caryl Pina, MD [facility] 07/29/2020, 9:58 AM

## 2020-07-29 NOTE — Telephone Encounter (Signed)
Patient has an apt with Dr. Elsworth Soho on 08/13/20, he can cancel apt with me on July 1st (he last saw TP on 05/22/20).

## 2020-07-30 NOTE — Telephone Encounter (Signed)
Informed the pt of the instructions listed from [facility] and he was made aware.

## 2020-07-30 NOTE — Telephone Encounter (Signed)
Noted. Will close encounter.

## 2020-07-30 NOTE — Telephone Encounter (Signed)
I have cancelled pt's appt which is scheduled with [facility] 7/1.  Attempted to call pt to let him know this had been done due to him scheduled to see Dr. Elsworth Soho on 7/6 for an appt but unable to reach pt.  I have left pt a detailed message about this and also stated in the message for pt to return my call so I am aware that he did receive my message.

## 2020-07-31 NOTE — Progress Notes (Addendum)
Cardiology Clinic Note   Patient Name: Kenneth Fuller Date of Encounter: 08/04/2020  Primary Care Provider:  [name], [name], MD Primary Cardiologist:  Sanda Klein, MD  Patient Profile    [name]. Terra 75 y.o. male present to the clinic today for a follow up of his HTN, and CAD.  Past Medical History    Past Medical History:  Diagnosis Date   Allergy    Asthma    CAD (coronary artery disease)    CHF (congestive heart failure) (HCC)    COPD (chronic obstructive pulmonary disease) (HCC)    Diabetes mellitus    Fibromyalgia    GERD (gastroesophageal reflux disease)    GI bleeding    Gout    Hyperlipidemia    Hypertension    Hypogonadism male    Insomnia    MRSA cellulitis    Neuropathy    Obesity    Shortness of breath dyspnea    with exertion    Sleep apnea    cpap- 14    Wheezing    no asthma diagnosis   Past Surgical History:  Procedure Laterality Date   BACK SURGERY     BIOPSY  12/20/2019   Procedure: BIOPSY;  Surgeon: Milus Banister, MD;  Location: [facility];  Service: Endoscopy;;   CARDIAC CATHETERIZATION     COLONOSCOPY     COLONOSCOPY WITH PROPOFOL N/A 12/20/2019   Procedure: COLONOSCOPY WITH PROPOFOL;  Surgeon: Milus Banister, MD;  Location: [facility];  Service: Endoscopy;  Laterality: N/A;   ESOPHAGOGASTRODUODENOSCOPY (EGD) WITH PROPOFOL N/A 12/20/2019   Procedure: ESOPHAGOGASTRODUODENOSCOPY (EGD) WITH PROPOFOL;  Surgeon: Milus Banister, MD;  Location: [facility];  Service: Endoscopy;  Laterality: N/A;   LEFT HEART CATH AND CORONARY ANGIOGRAPHY N/A 12/02/2016   Procedure: LEFT HEART CATH AND CORONARY ANGIOGRAPHY;  Surgeon: Jettie Booze, MD;  Location: [facility];  Service: Cardiovascular;  Laterality: N/A;   LUMBAR LAMINECTOMY/DECOMPRESSION MICRODISCECTOMY N/A 01/02/2014   Procedure: CENTRAL DECOMPRESSION LUMBAR LAMINECTOMY L3-L4, L4-L5;  Surgeon: Tobi Bastos, MD;  Location: [facility];  Service:  Orthopedics;  Laterality: N/A;   neck fusion     POLYPECTOMY  12/20/2019   Procedure: POLYPECTOMY;  Surgeon: Milus Banister, MD;  Location: [facility];  Service: Endoscopy;;    Allergies  Allergies  Allergen Reactions   Amlodipine Besy-Benazepril Hcl Swelling and Other (See Comments)    Makes tongue swell (lotrel)   Oxycodone Itching   Phenergan [Promethazine Hcl] Other (See Comments)    "I can't remember."   Hydrocodone Itching    Can tolerate in low doses    History of Present Illness    [name] has a PMH of essential hypertension, coronary artery disease (LHC 10/18 showed 10% stenosis in mid LAD and mid RCA), chronic diastolic heart failure, near syncope, OSA on CPAP, COPD, GERD, CKD 3, gout, insomnia, dizziness and hyperkalemia.  He is also noted to have a history of angioedema with ACE inhibitors.  Echocardiogram showed normal LV function 50-55%, and intermediate diastolic dysfunction.   He was hospitalized with shortness of breath 11/19-12/19.  His BNP at that time was slightly elevated at 330, his previous baseline was 72.6 in 2017.  An echocardiogram during his hospitalization showed an EF of 45-50%.  During his hospitalization 8/20 his BNP was elevated at 176 and he diuresed 10 L.  His dry weight has been noted to be around 270 pounds.   He was last seen by Dr. Loletha Grayer  on 04/11/2019.  During that time he indicated that he was having an increased amount of stress.  His wife have advanced cirrhosis and had developed problems with hepatic encephalopathy.  He indicated that she would sleep most of the day and had periods of confusion.  He was taking care of his children.  He indicated they had been married for 10 years.  He was noted to have increased exertional dyspnea and is furosemide was increased to 80 mg daily.  Low-dose spironolactone was also added.  His blood pressure was also elevated at the time and was felt to be related to increased stress.  He had an appointment with Dr.  Warrick Parisian the following week for follow-up labs.   He presented to the clinic 09/21/2019 for follow-up evaluation and stated he felt well.  He continued to stay very physically active doing work around his house, cleaning his pool, and doing yard work.  He had continued to follow a strict diet.  He was  down to 256 pounds.  He had been able to stop his Lasix and he had not noticed any lower extremity edema in quite some time.  His blood pressure was better controlled and was staying in the 130s over 80s at home.  I had him maintain his diet, physical activity, current medications, and planned follow-up with Dr. Sallyanne Kuster in 6 months.   He was seen in follow-up by Dr. Sallyanne Kuster 04/29/2020.  During that time he continued to be under increased stress due to his wife cirrhosis diagnosis and recurrent problems with encephalopathy.  They were planning to go to [place] for his 75th birthday.  He was also looking forward to celebrating their 60th wedding anniversary.  He denied episodes of chest tightness.  He reported exertional dyspnea after climbing 1 flight of stairs.  His baseline BNP appeared to be around 170.   He presented to the emergency department 06/07/2020.  He reported that his chest discomfort has been ongoing for the last few days.  He noted chest discomfort both with and without activity.  He also reported some dyspnea that was attributed to his COPD.  He was admitted for evaluation.  It was felt that his chest pain was atypical/musculoskeletal in nature due to chest wall tenderness.  His high-sensitivity troponins were negative, EKG showed no acute changes, echocardiogram showed LVEF 55-60% with G1 DD and no wall motion abnormalities.  His Isodril was increased to 20 mg 3 times daily.   He presented to the clinic 06/10/2020 for follow-up evaluation stated he felt fatigued, short of breath, and had noticed dizziness.  He reported that his blood pressure had been staying in the low 100s over 60's.  He  had not been able to perform the activities that he enjoyed.  He reported that he stopped his furosemide because he was noticing back pain and his rings were not staying on his hands.  His weight was down 5-1/2 pounds.  With stopping his furosemide his back pain went away.  He reported that he had a bout of pneumonia at the end of March and received antibiotics.  He reported that he also had a COVID-19 infection x2.  I ordered a BMP, cardiac event monitor, reduceed his hydralazine, and planned follow-up in 6 to 8 weeks.  His lab work was unremarkable.  His cardiac event monitor showed predominant normal sinus rhythm, rare PACs, PVCs and nonsustained VT.  No evidence of atrial fibrillation.  He presents to the clinic today for follow-up evaluation  states he is under increased amount of stress while he was caring for his wife who has cirrhosis and is hard of hearing.  He reports that his daughter and granddaughter have moved in with him and are helping with her care.  He brings his blood pressure log which shows blood pressures in the 150s over 70s-80s.  He notes that when his hydralazine was last his blood pressure seem to be less.  I have asked him to have his blood pressure cuff checked against a calibrated cuff.  We discussed possibility of a nursing visit later this week.  He will take his blood pressure cuff to [employer] station to have it checked alongside his.  He is currently using a wrist cuff that has fresh batteries.  We discussed how a arm cuff would be more accurate.  I will give him the mindfulness stress reduction sheet, have him continue to eat a low-sodium diet, maintain his hydration, and possibly add chlorthalidone to his medication regimen based off of his blood pressure readings.  He continues to be prescribed prednisone for his COPD/bronchitis and he reports that his breathing is becoming better.  We reviewed his cardiac event monitor and he expressed understanding.   Today he  denies chest pain, shortness of breath, lower extremity edema, fatigue, palpitations, melena, hematuria, hemoptysis, diaphoresis, weakness, presyncope, syncope, orthopnea, and PND.  Home Medications    Prior to Admission medications   Medication Sig Start Date End Date Taking? Authorizing Provider  albuterol (PROVENTIL) (2.5 MG/3ML) 0.083% nebulizer solution Take 3 mLs (2.5 mg total) by nebulization every 6 (six) hours as needed for wheezing or shortness of breath. 07/10/20   [name], [name], MD  albuterol (VENTOLIN HFA) 108 (90 Base) MCG/ACT inhaler Inhale 2 puffs into the lungs every 6 (six) hours as needed for wheezing or shortness of breath. 05/05/20   Claretta Fraise, MD  amoxicillin-clavulanate (AUGMENTIN) 875-125 MG tablet Take 1 tablet by mouth 2 (two) times daily. 07/29/20   [name], [name], MD  atorvastatin (LIPITOR) 40 MG tablet Take 40 mg by mouth daily. 1 Tablet Daily    [provider]  bisoprolol (ZEBETA) 5 MG tablet Take 1/2 (one-half) tablet by mouth once daily Patient taking differently: Take 2.5 mg by mouth daily. 06/04/20   [name], [name], MD  Budeson-Glycopyrrol-Formoterol (BREZTRI AEROSPHERE) 160-9-4.8 MCG/ACT AERO Inhale 2 puffs into the lungs in the morning and at bedtime. 01/23/20   [name], [name], MD  Cholecalciferol (VITAMIN D3) 125 MCG (5000 UT) TABS Take 5,000 Units by mouth daily.    [provider]  cloNIDine (CATAPRES) 0.2 MG tablet Take 1 tablet (0.2 mg total) by mouth 3 (three) times daily. 07/28/20   [name], [name], MD  colchicine 0.6 MG tablet Take 0.6 mg by mouth daily. As needed    [provider]  dapagliflozin propanediol (FARXIGA) 5 MG TABS tablet Take 1 tablet (5 mg total) by mouth daily. 01/02/20   [name], [name], MD  famotidine (PEPCID) 20 MG tablet Take 1 tablet (20 mg total) by mouth at bedtime. 05/07/20   Martyn Ehrich, NP  FEROSUL 325 (65 Fe) MG tablet Take 325 mg by mouth 3 (three) times daily.   08/15/19   [provider]  furosemide (LASIX) 40 MG tablet Take 40 mg by mouth daily as needed (fluid retention.).    [provider]  hydrALAZINE (APRESOLINE) 25 MG tablet Take 3 tablets (75 mg total) by mouth 3 (three) times daily. 07/18/20  Croitoru, Mihai, MD  isosorbide dinitrate (ISORDIL) 20 MG tablet Take 1 tablet (20 mg total) by mouth 3 (three) times daily. 06/07/20 07/07/20  Manuella Ghazi, Pratik D, DO  methylPREDNISolone (MEDROL DOSEPAK) 4 MG TBPK tablet Medrol Dosepak take as instructed 07/10/20   Deatra James, MD  MYRBETRIQ 25 MG TB24 tablet Take 1 tablet by mouth once daily 07/02/20   [name], [name], MD  nitroGLYCERIN (NITROSTAT) 0.4 MG SL tablet Place 1 tablet (0.4 mg total) under the tongue every 5 (five) minutes x 3 doses as needed for chest pain. 06/07/20 07/07/20  Manuella Ghazi, Pratik D, DO  omeprazole (PRILOSEC) 40 MG capsule Take 1 capsule (40 mg total) by mouth 2 (two) times daily at 8 am and 10 pm. Take 30-60 minutes prior to breakfast. 05/13/20   Ladene Artist, MD  [facility] ULTRA test strip USE 1 STRIP TO CHECK GLUCOSE ONCE DAILY 07/25/20   [name], [name], MD  polyethylene glycol (MIRALAX / GLYCOLAX) 17 g packet Take 17 g by mouth as needed for mild constipation.    [provider]  predniSONE (DELTASONE) 20 MG tablet Take 3 tabs daily for 1 week, then 2 tabs daily for week 2, then 1 tab daily for week 3. 07/29/20   [name], [name], MD  RESTASIS 0.05 % ophthalmic emulsion Place 1 drop into both eyes in the morning and at bedtime. 12/07/19   [provider]  sertraline (ZOLOFT) 100 MG tablet Take 1 tablet (100 mg total) by mouth daily. 07/28/20   [name], [name], MD  tamsulosin (FLOMAX) 0.4 MG CAPS capsule Take 1 capsule (0.4 mg total) by mouth daily after supper. 11/22/19   [name], [name], MD    Family History    Family History  Problem Relation Age of Onset   Colon cancer Mother    Diabetes Father        siblings   Heart disease  Father        brother   Heart attack Father    Kidney disease Sister    Heart failure Sister    Heart disease Brother    Heart attack Son 41   Drug abuse Sister    Heart disease Brother    Deep vein thrombosis Brother    Colon polyps Neg Hx    He indicated that his mother is deceased. He indicated that his father is deceased. He indicated that six of his eight sisters are alive. He indicated that three of his five brothers are alive. He indicated that his maternal grandmother is deceased. He indicated that his maternal grandfather is deceased. He indicated that his paternal grandmother is deceased. He indicated that his paternal grandfather is deceased. He indicated that his daughter is alive. He indicated that both of his sons are alive. He indicated that the status of his neg hx is unknown.  Social History    Social History   Socioeconomic History   Marital status: Married    Spouse name: Butch Penny   Number of children: 3   Years of education: 8   Highest education level: GED or equivalent  Occupational History   Occupation: retired/disability 1999    Employer: DISABLED    Comment: Textiles  Tobacco Use   Smoking status: Former    Pack years: 0.00    Types: Cigarettes    Quit date: 02/08/1969    Years since quitting: 51.5   Smokeless tobacco: Former    Quit date: [place]  Use: Never used  Substance and Sexual Activity   Alcohol use: No    Alcohol/week: 0.0 standard drinks   Drug use: No   Sexual activity: Not Currently  Other Topics Concern   Not on file  Social History Narrative   Drinks caffeine tea occasionally    Social Determinants of Health   Financial Resource Strain: Not on file  Food Insecurity: Not on file  Transportation Needs: Not on file  Physical Activity: Not on file  Stress: Not on file  Social Connections: Not on file  Intimate Partner Violence: Not on file     Review of Systems    General:  No chills, fever, night sweats or  weight changes.  Cardiovascular:  No chest pain, dyspnea on exertion, edema, orthopnea, palpitations, paroxysmal nocturnal dyspnea. Dermatological: No rash, lesions/masses Respiratory: No cough, dyspnea Urologic: No hematuria, dysuria Abdominal:   No nausea, vomiting, diarrhea, bright red blood per rectum, melena, or hematemesis Neurologic:  No visual changes, wkns, changes in mental status. All other systems reviewed and are otherwise negative except as noted above.  Physical Exam    VS:  BP 128/80 (BP Location: Left Arm, Patient Position: Sitting, Cuff Size: Normal)   Pulse 77   Ht _0  (1.702 m)   Wt 268 lb 9.6 oz (121.8 kg)   SpO2 96%   BMI 42.07 kg/m  , BMI Body mass index is 42.07 kg/m. GEN: Well nourished, well developed, in no acute distress. HEENT: normal. Neck: Supple, no JVD, carotid bruits, or masses. Cardiac: RRR, no murmurs, rubs, or gallops. No clubbing, cyanosis, edema.  Radials/DP/PT 2+ and equal bilaterally.  Respiratory:  Respirations regular and unlabored, clear to auscultation bilaterally. GI: Soft, nontender, nondistended, BS + x 4. MS: no deformity or atrophy. Skin: warm and dry, no rash. Neuro:  Strength and sensation are intact. Psych: Normal affect.  Accessory Clinical Findings    Recent Labs: 05/07/2020: Pro B Natriuretic peptide (BNP) 219.0 06/06/2020: B Natriuretic Peptide 279.0 07/10/2020: Magnesium 2.4 07/14/2020: ALT 33; BUN 31; Creatinine, Ser 1.25; Hemoglobin 16.3; Platelets 250; Potassium 5.5; Sodium 139   Recent Lipid Panel    Component Value Date/Time   CHOL 90 (L) 10/04/2019 1349   CHOL 111 06/12/2012 1232   TRIG 142 10/04/2019 1349   TRIG 141 01/24/2014 1143   TRIG 192 (H) 06/12/2012 1232   HDL 40 10/04/2019 1349   HDL 38 (L) 01/24/2014 1143   HDL 33 (L) 06/12/2012 1232   CHOLHDL 2.3 10/04/2019 1349   CHOLHDL 4.8 11/22/2006 1712   VLDL 39 11/22/2006 1712   LDLCALC 26 10/04/2019 1349   LDLCALC 27 10/25/2013 1224   LDLCALC 40  06/12/2012 1232    ECG personally reviewed by me today-none today.  EKG 06/03/2019 Sinus rhythm 76 bpm no ST or T wave deviation   Echocardiogram 09/24/2018 IMPRESSIONS     1. The left ventricle has low normal systolic function, with an ejection  fraction of 50-55%. The cavity size was moderately dilated. There is mild  concentric left ventricular hypertrophy. Diastolic dysfunction, grade  indeterminate. Indeterminate filling   pressures.   2. The right ventricle has normal systolic function. The cavity was  normal. There is no increase in right ventricular wall thickness.   3. The aortic valve is tricuspid. Mild thickening of the aortic valve.  Mild to moderate aortic annular calcification noted.   4. The mitral valve is grossly normal. There is mild mitral annular  calcification present.   5. The aorta  is normal unless otherwise noted.   Echocardiogram 06/07/2020 IMPRESSIONS     1. Left ventricular ejection fraction, by estimation, is 55 to 60%. The  left ventricle has normal function. The left ventricle has no regional  wall motion abnormalities. There is mild left ventricular hypertrophy.  Left ventricular diastolic parameters  are consistent with Grade I diastolic dysfunction (impaired relaxation).   2. Right ventricular systolic function is normal. The right ventricular  size is normal.   3. Left atrial size was moderately dilated.   4. The mitral valve is grossly normal. Trivial mitral valve  regurgitation.   5. The aortic valve is tricuspid. Aortic valve regurgitation is not  visualized.   6. The inferior vena cava is normal in size with greater than 50%  respiratory variability, suggesting right atrial pressure of 3 mmHg.   Comparison(s): Changes from prior study are noted. 09/24/2018: LVEF 50-55%.  Cardiac event monitor 07/01/2020 The dominant rhythm is normal sinus with blunted circadian variation, suggestive of chronotropic incompetence. The maximum heart rate is  only 74% of the maximum predicted for age. There are very rare premature atrial and premature ventricular beats and a single 4 beat run of nonsustained ventricular tachycardia. Mild bradycardia seen, there are no episodes of severe bradycardia or pauses or atrioventricular block. There is no evidence of atrial fibrillation.   Mildly abnormal event monitor due to the presence of blunted circadian rhythm variation and chronotropic incompetence, likely related to beta-blocker use.  Assessment & Plan   1.  Chest pain/dizziness/palpitations-continues to have episodes of dizziness.  Cardiac event monitor showed PAC/PVC, single 4 beat run of NSVT, and mild bradycardia associated with medication.  Previously reporting episodes dizziness, palpitations, and lower blood pressure.  Presented to the emergency department on 06/06/2020 and was discharged on 06/07/2020.  Echocardiogram showed improved LV EF, G1 DD, and mildly dilated left atria.  High-sensitivity troponins were low and flat.  It was felt that his chest discomfort was musculoskeletal in nature due to chest wall pain.  I will have him check his blood pressure cuff with a calibrated cuff and make further recommendations at that time. Reviewed cardiac event monitor Heart healthy low-sodium diet Maintain physical activity No further recommendations at this time.  Chronic systolic and diastolic CHF-no increased DOE .  Euvolemic.  Weight today268 pounds. Continue bisoprolol, isosorbide dinitrate, Farxiga Change furosemide to as needed Daily weights Heart healthy low-sodium diet-salty 6 given Increase physical activity as tolerated   Essential hypertension-BP today 128/80.  Continues to be elevated at home.  There is some question as to whether his cuff is giving elevated readings.  He is going to have his cuff checked with a calibrated cuff. Continue  , bisoprolol, isosorbide dinitrate, clonidine Reduce hydralazine to 25 mg 3 times daily Heart  healthy low-sodium diet-salty 6 given Increase physical activity as tolerated Maintain blood pressure log   Hyperlipidemia- LDL Chol Calc 26 on 10/04/2019 Continue atorvastatin Heart healthy low-sodium high-fiber diet Increase physical activity as tolerated   COPD-breathing is improving. Noted to have markedly abnormal PFTs in the past.   Bisoprolol used due to its selective beta blocking properties. Continue bisoprolol, albuterol, Dulera, Continue home oxygen Monitor O2 saturation Increase physical activity as tolerated     Disposition: Follow-up with Dr. Sallyanne Kuster 3-4 months  Jossie Ng. Imri Lor NP-C    08/04/2020, 3:17 PM [facility] [place] [address] Office [phone] Fax [phone]  Notice: This dictation was prepared with Dragon dictation along with  smaller phrase technology. Any transcriptional errors that result from this process are unintentional and may not be corrected upon review.  I spent 15 minutes examining this patient, reviewing medications, and using patient centered shared decision making involving her cardiac care.  Prior to her visit I spent greater than 20 minutes reviewing her past medical history,  medications, and prior cardiac tests.

## 2020-08-03 ENCOUNTER — Other Ambulatory Visit: Payer: Self-pay | Admitting: Family Medicine

## 2020-08-03 DIAGNOSIS — F339 Major depressive disorder, recurrent, unspecified: Secondary | ICD-10-CM

## 2020-08-04 ENCOUNTER — Other Ambulatory Visit: Payer: Self-pay

## 2020-08-04 ENCOUNTER — Ambulatory Visit: Payer: PPO | Admitting: General Practice

## 2020-08-04 ENCOUNTER — Encounter: Payer: Self-pay | Admitting: General Practice

## 2020-08-04 VITALS — BP 128/80 | HR 77 | Ht 67.0 in | Wt 268.6 lb

## 2020-08-04 DIAGNOSIS — J449 Chronic obstructive pulmonary disease, unspecified: Secondary | ICD-10-CM

## 2020-08-04 DIAGNOSIS — R002 Palpitations: Secondary | ICD-10-CM | POA: Diagnosis not present

## 2020-08-04 DIAGNOSIS — I5042 Chronic combined systolic (congestive) and diastolic (congestive) heart failure: Secondary | ICD-10-CM

## 2020-08-04 DIAGNOSIS — I10 Essential (primary) hypertension: Secondary | ICD-10-CM

## 2020-08-04 DIAGNOSIS — E1169 Type 2 diabetes mellitus with other specified complication: Secondary | ICD-10-CM | POA: Diagnosis not present

## 2020-08-04 DIAGNOSIS — E785 Hyperlipidemia, unspecified: Secondary | ICD-10-CM

## 2020-08-04 NOTE — Patient Instructions (Signed)
Medication Instructions:  The current medical regimen is effective;  continue present plan and medications as directed. Please refer to the Current Medication list given to you today.  *If you need a refill on your cardiac medications before your next appointment, please call your pharmacy*  Lab Work:   Testing/Procedures:  NONE    NONE  Special Instructions GOT TO FIRE STATION AND HAVE THEM CHECK YOUR BLOOD PRESSURE MACHINE FOR ACCURACY-CALL WITH BLOOD PRESSURES Thursday OR Friday.  TAKE AND LOG YOUR BLOOD PRESSURE  PLEASE READ AND FOLLOW SALTY 6-ATTACHED-1,843m daily  PLEASE INCREASE PHYSICAL ACTIVITY AS TOLERATED-GO UP AND DOWN THE STAIRS 2 TIMES DAILY  [facility] you and your health needs are our priority.  As part of our continuing mission to provide you with exceptional heart care, we have created designated Provider Care Teams.  These Care Teams include your primary Cardiologist (physician) and Advanced Practice Providers (APPs -  Physician Assistants and Nurse Practitioners) who all work together to provide you with the care you need, when you need it.  We recommend signing up for the patient portal called "MyChart".  Sign up information is provided on this After Visit Summary.  MyChart is used to connect with patients for Virtual Visits (Telemedicine).  Patients are able to view lab/test results, encounter notes, upcoming appointments, etc.  Non-urgent messages can be sent to your provider as well.   To learn more about what you can do with MyChart, go to [email or web address]              6 SALTY THINGS TO AVOID     1,800MG DAILY

## 2020-08-05 ENCOUNTER — Encounter: Payer: Self-pay | Admitting: *Deleted

## 2020-08-05 ENCOUNTER — Ambulatory Visit: Payer: PPO | Admitting: *Deleted

## 2020-08-05 DIAGNOSIS — J449 Chronic obstructive pulmonary disease, unspecified: Secondary | ICD-10-CM | POA: Diagnosis not present

## 2020-08-05 DIAGNOSIS — F418 Other specified anxiety disorders: Secondary | ICD-10-CM | POA: Diagnosis not present

## 2020-08-05 DIAGNOSIS — I5042 Chronic combined systolic (congestive) and diastolic (congestive) heart failure: Secondary | ICD-10-CM | POA: Diagnosis not present

## 2020-08-05 DIAGNOSIS — I10 Essential (primary) hypertension: Secondary | ICD-10-CM | POA: Diagnosis not present

## 2020-08-05 DIAGNOSIS — E1142 Type 2 diabetes mellitus with diabetic polyneuropathy: Secondary | ICD-10-CM | POA: Diagnosis not present

## 2020-08-05 DIAGNOSIS — I2510 Atherosclerotic heart disease of native coronary artery without angina pectoris: Secondary | ICD-10-CM | POA: Diagnosis not present

## 2020-08-07 ENCOUNTER — Ambulatory Visit (INDEPENDENT_AMBULATORY_CARE_PROVIDER_SITE_OTHER): Payer: PPO

## 2020-08-07 VITALS — Ht 67.0 in | Wt 268.0 lb

## 2020-08-07 DIAGNOSIS — Z0000 Encounter for general adult medical examination without abnormal findings: Secondary | ICD-10-CM

## 2020-08-07 NOTE — Progress Notes (Signed)
Subjective:   Kenneth Fuller is a 75 y.o. male who presents for Medicare Annual/Subsequent preventive examination.  Virtual Visit via Telephone Note  I connected with  Kenneth Fuller on 08/07/20 at 10:30 AM EDT by telephone and verified that I am speaking with the correct person using two identifiers.  Location: Patient: Home Provider: [facility] Persons participating in the virtual visit: patient/Nurse Health Advisor   I discussed the limitations, risks, security and privacy concerns of performing an evaluation and management service by telephone and the availability of in person appointments. The patient expressed understanding and agreed to proceed.  Interactive audio and video telecommunications were attempted between this nurse and patient, however failed, due to patient having technical difficulties OR patient did not have access to video capability.  We continued and completed visit with audio only.  Some vital signs may be absent or patient reported.   Fonda Rochon E Darolyn Double, LPN   Review of Systems     Cardiac Risk Factors include: advanced age (>45mn, >>28women);diabetes mellitus;dyslipidemia;hypertension;male gender;obesity (BMI >30kg/m2);sedentary lifestyle     Objective:    Today's Vitals   08/07/20 1008  Weight: 268 lb (121.6 kg)  Height: _0  (1.702 m)  PainSc: 3    Body mass index is 41.97 kg/m.  Advanced Directives 08/07/2020 07/09/2020 06/06/2020 06/06/2020 12/20/2019 07/27/2019 06/04/2019  Does Patient Have a Medical Advance Directive? _1  No No  Would patient like information on creating a medical advance directive? Yes (MAU/Ambulatory/Procedural Areas - Information given) No - Patient declined Yes (Inpatient - patient defers creating a medical advance directive at this time - Information given) - No - Patient declined No - Patient declined No - Patient declined    Current Medications (verified) Outpatient Encounter Medications as of 08/07/2020   Medication Sig   albuterol (PROVENTIL) (2.5 MG/3ML) 0.083% nebulizer solution Take 3 mLs (2.5 mg total) by nebulization every 6 (six) hours as needed for wheezing or shortness of breath.   albuterol (VENTOLIN HFA) 108 (90 Base) MCG/ACT inhaler Inhale 2 puffs into the lungs every 6 (six) hours as needed for wheezing or shortness of breath.   amoxicillin-clavulanate (AUGMENTIN) 875-125 MG tablet Take 1 tablet by mouth 2 (two) times daily.   atorvastatin (LIPITOR) 40 MG tablet Take 40 mg by mouth daily. 1 Tablet Daily   bisoprolol (ZEBETA) 5 MG tablet Take 1/2 (one-half) tablet by mouth once daily (Patient taking differently: Take 2.5 mg by mouth daily.)   Budeson-Glycopyrrol-Formoterol (BREZTRI AEROSPHERE) 160-9-4.8 MCG/ACT AERO Inhale 2 puffs into the lungs in the morning and at bedtime. (Patient not taking: Reported on 08/04/2020)   Cholecalciferol (VITAMIN D3) 125 MCG (5000 UT) TABS Take 5,000 Units by mouth daily.   cloNIDine (CATAPRES) 0.2 MG tablet Take 1 tablet (0.2 mg total) by mouth 3 (three) times daily.   colchicine 0.6 MG tablet Take 0.6 mg by mouth daily. As needed   dapagliflozin propanediol (FARXIGA) 5 MG TABS tablet Take 1 tablet (5 mg total) by mouth daily.   famotidine (PEPCID) 20 MG tablet Take 1 tablet (20 mg total) by mouth at bedtime.   FEROSUL 325 (65 Fe) MG tablet Take 325 mg by mouth 3 (three) times daily.  (Patient not taking: Reported on 08/04/2020)   furosemide (LASIX) 40 MG tablet Take 40 mg by mouth daily as needed (fluid retention.).   hydrALAZINE (APRESOLINE) 25 MG tablet Take 3 tablets (75 mg total) by mouth 3 (three) times daily.   isosorbide dinitrate (ISORDIL) 20  MG tablet Take 1 tablet (20 mg total) by mouth 3 (three) times daily.   methylPREDNISolone (MEDROL DOSEPAK) 4 MG TBPK tablet Medrol Dosepak take as instructed   MYRBETRIQ 25 MG TB24 tablet Take 1 tablet by mouth once daily   nitroGLYCERIN (NITROSTAT) 0.4 MG SL tablet Place 1 tablet (0.4 mg total) under the  tongue every 5 (five) minutes x 3 doses as needed for chest pain.   omeprazole (PRILOSEC) 40 MG capsule Take 1 capsule (40 mg total) by mouth 2 (two) times daily at 8 am and 10 pm. Take 30-60 minutes prior to breakfast.   ONETOUCH ULTRA test strip USE 1 STRIP TO CHECK GLUCOSE ONCE DAILY   polyethylene glycol (MIRALAX / GLYCOLAX) 17 g packet Take 17 g by mouth as needed for mild constipation.   predniSONE (DELTASONE) 20 MG tablet Take 3 tabs daily for 1 week, then 2 tabs daily for week 2, then 1 tab daily for week 3.   RESTASIS 0.05 % ophthalmic emulsion Place 1 drop into both eyes in the morning and at bedtime.   sertraline (ZOLOFT) 100 MG tablet Take 1 tablet (100 mg total) by mouth daily.   tamsulosin (FLOMAX) 0.4 MG CAPS capsule Take 1 capsule (0.4 mg total) by mouth daily after supper.   No facility-administered encounter medications on file as of 08/07/2020.    Allergies (verified) Amlodipine besy-benazepril hcl, Oxycodone, Phenergan [promethazine hcl], and Hydrocodone   History: Past Medical History:  Diagnosis Date   Allergy    Asthma    CAD (coronary artery disease)    CHF (congestive heart failure) (HCC)    COPD (chronic obstructive pulmonary disease) ([place])    Diabetes mellitus    Fibromyalgia    GERD (gastroesophageal reflux disease)    GI bleeding    Gout    Hyperlipidemia    Hypertension    Hypogonadism male    Insomnia    MRSA cellulitis    Neuropathy    Obesity    Shortness of breath dyspnea    with exertion    Sleep apnea    cpap- 14    Wheezing    no asthma diagnosis   Past Surgical History:  Procedure Laterality Date   BACK SURGERY     BIOPSY  12/20/2019   Procedure: BIOPSY;  Surgeon: Milus Banister, MD;  Location: [facility];  Service: Endoscopy;;   CARDIAC CATHETERIZATION     COLONOSCOPY     COLONOSCOPY WITH PROPOFOL N/A 12/20/2019   Procedure: COLONOSCOPY WITH PROPOFOL;  Surgeon: Milus Banister, MD;  Location: [facility];  Service:  Endoscopy;  Laterality: N/A;   ESOPHAGOGASTRODUODENOSCOPY (EGD) WITH PROPOFOL N/A 12/20/2019   Procedure: ESOPHAGOGASTRODUODENOSCOPY (EGD) WITH PROPOFOL;  Surgeon: Milus Banister, MD;  Location: [facility];  Service: Endoscopy;  Laterality: N/A;   LEFT HEART CATH AND CORONARY ANGIOGRAPHY N/A 12/02/2016   Procedure: LEFT HEART CATH AND CORONARY ANGIOGRAPHY;  Surgeon: Jettie Booze, MD;  Location: [facility] [facility];  Service: Cardiovascular;  Laterality: N/A;   LUMBAR LAMINECTOMY/DECOMPRESSION MICRODISCECTOMY N/A 01/02/2014   Procedure: CENTRAL DECOMPRESSION LUMBAR LAMINECTOMY L3-L4, L4-L5;  Surgeon: Tobi Bastos, MD;  Location: [facility];  Service: Orthopedics;  Laterality: N/A;   neck fusion     POLYPECTOMY  12/20/2019   Procedure: POLYPECTOMY;  Surgeon: Milus Banister, MD;  Location: [facility];  Service: Endoscopy;;   Family History  Problem Relation Age of Onset   Colon cancer Mother    Diabetes Father  siblings   Heart disease Father        brother   Heart attack Father    Kidney disease Sister    Heart failure Sister    Heart disease Brother    Heart attack Son 4   Drug abuse Sister    Heart disease Brother    Deep vein thrombosis Brother    Colon polyps Neg Hx    Social History   Socioeconomic History   Marital status: Married    Spouse name: Butch Penny   Number of children: 3   Years of education: 8   Highest education level: GED or equivalent  Occupational History   Occupation: retired/disability 1999    Employer: DISABLED    Comment: Textiles  Tobacco Use   Smoking status: Former    Pack years: 0.00    Types: Cigarettes    Quit date: 02/08/1969    Years since quitting: 51.5   Smokeless tobacco: Former    Quit date: 1971  Scientific laboratory technician Use: Never used  Substance and Sexual Activity   Alcohol use: No    Alcohol/week: 0.0 standard drinks   Drug use: No   Sexual activity: Not Currently  Other Topics Concern   Not on file  Social  History Narrative   Drinks caffeine tea occasionally   Lives with wife Butch Penny, children live nearby - [place] and [place]   One son, Heath Lark passed away last year suddenly   Social Determinants of Health   Financial Resource Strain: Low Risk    Difficulty of Paying Living Expenses: Not very hard  Food Insecurity: No Food Insecurity   Worried About Charity fundraiser in the Last Year: Never true   Arboriculturist in the Last Year: Never true  Transportation Needs: No Transportation Needs   Lack of Transportation (Medical): No   Lack of Transportation (Non-Medical): No  Physical Activity: Inactive   Days of Exercise per Week: 0 days   Minutes of Exercise per Session: 0 min  Stress: No Stress Concern Present   Feeling of Stress : Not at all  Social Connections: Socially Integrated   Frequency of Communication with Friends and Family: More than three times a week   Frequency of Social Gatherings with Friends and Family: More than three times a week   Attends Religious Services: More than 4 times per year   Active Member of [employer] or Organizations: Yes   Attends Music therapist: More than 4 times per year   Marital Status: Married    Tobacco Counseling Counseling given: Not Answered   Clinical Intake:  Pre-visit preparation completed: Yes  Pain : 0-10 Pain Score: 3  Pain Type: Chronic pain Pain Location: Generalized Pain Descriptors / Indicators: Aching, Sore Pain Onset: More than a month ago Pain Frequency: Intermittent     BMI - recorded: 41.97 Nutritional Status: BMI > 30  Obese Nutritional Risks: None Diabetes: Yes CBG done?: No Did pt. bring in CBG monitor from home?: No  How often do you need to have someone help you when you read instructions, pamphlets, or other written materials from your doctor or pharmacy?: 1 - Never  Nutrition Risk Assessment:  Has the patient had any N/V/D within the last 2 months?  No  Does the patient have any non-healing  wounds?  No  Has the patient had any unintentional weight loss or weight gain?  No   Diabetes:  Is the patient diabetic?  Yes  If diabetic, was a CBG obtained today?  No  Did the patient bring in their glucometer from home?  No  How often do you monitor your CBG's? Once daily fasting - 166 this morning per patient.   Financial Strains and Diabetes Management:  Are you having any financial strains with the device, your supplies or your medication? Yes . But CCM is helping  Does the patient want to be seen by Chronic Care Management for management of their diabetes?  No  Would the patient like to be referred to a Nutritionist or for Diabetic Management?  No   Diabetic Exams:  Diabetic Eye Exam: Completed 05/23/2019. Overdue for diabetic eye exam. Pt has been advised about the importance in completing this exam. He will make another appt soon  Diabetic Foot Exam: Completed 11/15/2018. Pt has been advised about the importance in completing this exam. Pt is scheduled for diabetic foot exam on 08/18/20.    Interpreter Needed?: No  Information entered by :: Rendon Howell, LPN   Activities of Daily Living In your present state of health, do you have any difficulty performing the following activities: 08/07/2020 07/09/2020  Hearing? N N  Vision? N N  Comment - -  Difficulty concentrating or making decisions? N N  Walking or climbing stairs? Y Y  Dressing or bathing? N N  Doing errands, shopping? N N  Preparing Food and eating ? N -  Using the Toilet? N -  In the past six months, have you accidently leaked urine? Y -  Do you have problems with loss of bowel control? N -  Managing your Medications? N -  Managing your Finances? N -  Housekeeping or managing your Housekeeping? N -  Some recent data might be hidden    Patient Care Team: [name], [name], MD as PCP - General (Family Medicine) Croitoru, Dani Gobble, MD as PCP - Cardiology (Cardiology) Harlen Labs, MD as Referring  Physician (Optometry) Shea Evans, [name], LCSW as Social Worker (Licensed Clinical Social Worker) Cori Razor, Delice Bison, RN as Case Manager Croitoru, Dani Gobble, MD as Consulting Physician (Cardiology) [name], [facility] (Pharmacist) Liana Gerold, MD as Consulting Physician (Nephrology) Milus Banister, MD as Attending Physician (Gastroenterology) Rigoberto Noel, MD as Consulting Physician (Pulmonary Disease)  Indicate any recent Medical Services you may have received from other than [facility] providers in the past year (date may be approximate).     Assessment:   This is a routine wellness examination for Kenneth Fuller.  Hearing/Vision screen Hearing Screening - Comments:: Denies hearing difficulties  Vision Screening - Comments:: Wears eyeglasses - behind on eye exam with Dr Marin Comment - usually gets eye exam with Dr Marin Comment and buys glasses at [facility] in [place] issues and exercise activities discussed: Current Exercise Habits: The patient does not participate in regular exercise at present, Exercise limited by: respiratory conditions(s);orthopedic condition(s);cardiac condition(s)   Goals Addressed               This Visit's Progress     Stress/Anxiety Management (pt-stated)        Current Barriers:  Personal health concerns Wife's has health concerns High cost of medications  Nurse Case Manager Clinical Goal(s):  Over the next 30 days, patient will work with the CCM team to more effectively manage his stress level and anxiety.   Interventions:  Advised patient to continue talking with Theadore Nan, LCSW about his psychosocial needs Collaborated with PCP regarding patient's request to increaes clonazepam from .  78m qhs to  .529mdaily PRN and QHS Dr [name] declined to increase at this time due to respiratory complications and instead recommended Benadryl OTC as directed  Patient advised of recommendations Encouraged patient to seek emergency medical attention as needed Patient has  CCM team contact information and will reach out as needed Encouraged to keep upcoming appointment with PCP in 02/2019 and to reschedule for sooner if necessary  Patient Self Care Activities:  Performs ADL's independently Performs IADL's independently  Initial goal documentation         Depression Screen PHQ 2/9 Scores 08/07/2020 07/29/2020 07/21/2020 07/14/2020 05/19/2020 05/05/2020 02/07/2020  PHQ - 2 Score _0 0 0 0 1  PHQ- 9 Score 6 - 3 0 - - -    Fall Risk Fall Risk  08/07/2020 07/29/2020 07/14/2020 05/19/2020 05/05/2020  Falls in the past year? _1 Number falls in past yr: 1 0 0 0 0  Injury with Fall? 0 0 0 0 0  Comment - - - - -  Risk for fall due to : History of fall(s);Impaired vision;Orthopedic patient Impaired balance/gait History of fall(s) Impaired balance/gait History of fall(s)  Follow up Falls prevention discussed;Education provided Falls evaluation completed Falls evaluation completed Falls evaluation completed Falls evaluation completed    FALL RISK PREVENTION PERTAINING TO THE HOME:  Any stairs in or around the home? Yes  If so, are there any without handrails? No  Home free of loose throw rugs in walkways, pet beds, electrical cords, etc? Yes  Adequate lighting in your home to reduce risk of falls? Yes   ASSISTIVE DEVICES UTILIZED TO PREVENT FALLS:  Life alert? No  Use of a cane, walker or w/c? No  Grab bars in the bathroom? Yes  Shower chair or bench in shower? Yes  Elevated toilet seat or a handicapped toilet? Yes   TIMED UP AND GO:  Was the test performed? No . Telephonic visit  Cognitive Function: Normal cognitive status assessed by direct observation by this Nurse Health Advisor. No abnormalities found.   MMSE - Mini Mental State Exam 12/20/2016 12/04/2014  Orientation to time 5 5  Orientation to Place 5 5  Registration 3 3  Attention/ Calculation 5 5  Recall 3 3  Language- name 2 objects 2 2  Language- repeat 1 1  Language- follow 3 step  command 3 3  Language- read & follow direction 1 1  Write a sentence 1 1  Copy design 1 1  Total score 30 30     6CIT Screen 07/27/2019 07/17/2018  What Year? 0 points 0 points  What month? 0 points 0 points  What time? 0 points 0 points  Count back from 20 0 points 0 points  Months in reverse 0 points 2 points  Repeat phrase 2 points 0 points  Total Score 2 2    Immunizations Immunization History  Administered Date(s) Administered   Fluad Quad(high Dose 65+) 11/15/2018   Influenza Whole 10/09/2008   Influenza, High Dose Seasonal PF 11/29/2016, 11/08/2017   Influenza,inj,Quad PF,6+ Mos 12/04/2013, 12/04/2014, 12/23/2015   Pneumococcal Conjugate-13 12/04/2013   Pneumococcal Polysaccharide-23 08/26/2011   Td 11/08/2017    TDAP status: Up to date  Flu Vaccine status: Due, Education has been provided regarding the importance of this vaccine. Advised may receive this vaccine at local pharmacy or Health Dept. Aware to provide a copy of the vaccination record if obtained from local pharmacy or Health Dept. Verbalized acceptance and  understanding.  Pneumococcal vaccine status: Up to date  Covid-19 vaccine status: Declined, Education has been provided regarding the importance of this vaccine but patient still declined. Advised may receive this vaccine at local pharmacy or Health Dept.or vaccine clinic. Aware to provide a copy of the vaccination record if obtained from local pharmacy or Health Dept. Verbalized acceptance and understanding.  Qualifies for Shingles Vaccine? Yes   Zostavax completed No   Shingrix Completed?: No.    Education has been provided regarding the importance of this vaccine. Patient has been advised to call insurance company to determine out of pocket expense if they have not yet received this vaccine. Advised may also receive vaccine at local pharmacy or Health Dept. Verbalized acceptance and understanding.  Screening Tests Health Maintenance  Topic Date Due    Zoster Vaccines- Shingrix (1 of 2) Never done   OPHTHALMOLOGY EXAM  05/22/2020   FOOT EXAM  08/18/2020 (Originally 11/15/2019)   COVID-19 Vaccine (1) 08/18/2020 (Originally 05/03/1950)   INFLUENZA VACCINE  09/08/2020   HEMOGLOBIN A1C  01/08/2021   COLONOSCOPY (Pts 45-26yr Insurance coverage will need to be confirmed)  12/19/2024   TETANUS/TDAP  11/09/2027   Hepatitis C Screening  Completed   PNA vac Low Risk Adult  Addressed   HPV VACCINES  Aged Out    Health Maintenance  Health Maintenance Due  Topic Date Due   Zoster Vaccines- Shingrix (1 of 2) Never done   OPHTHALMOLOGY EXAM  05/22/2020    Colorectal cancer screening: Type of screening: Colonoscopy. Completed 12/20/2019. Repeat every 5 years  Lung Cancer Screening: (Low Dose CT Chest recommended if Age 74-80years, 30 pack-year currently smoking OR have quit w/in 15years.) does not qualify.   Additional Screening:  Hepatitis C Screening: does qualify; Completed 08/21/2015  Vision Screening: Recommended annual ophthalmology exams for early detection of glaucoma and other disorders of the eye. Is the patient up to date with their annual eye exam?  No  Who is the provider or what is the name of the office in which the patient attends annual eye exams? Dr LMarin CommentIf pt is not established with a provider, would they like to be referred to a provider to establish care? No .   Dental Screening: Recommended annual dental exams for proper oral hygiene  Community Resource Referral / Chronic Care Management: CRR required this visit?  No   CCM required this visit?  No      Plan:     I have personally reviewed and noted the following in the patient's chart:   Medical and social history Use of alcohol, tobacco or illicit drugs  Current medications and supplements including opioid prescriptions. Patient is not currently taking opioid prescriptions. Functional ability and status Nutritional status Physical activity Advanced  directives List of other physicians Hospitalizations, surgeries, and ER visits in previous 12 months Vitals Screenings to include cognitive, depression, and falls Referrals and appointments  In addition, I have reviewed and discussed with patient certain preventive protocols, quality metrics, and best practice recommendations. A written personalized care plan for preventive services as well as general preventive health recommendations were provided to patient.     ASandrea Hammond LPN   [id number]  Nurse Notes: None

## 2020-08-07 NOTE — Patient Instructions (Signed)
[name] , Thank you for taking time to come for your Medicare Wellness Visit. I appreciate your ongoing commitment to your health goals. Please review the following plan we discussed and let me know if I can assist you in the future.   Screening recommendations/referrals: Colonoscopy: Done 12/20/2019 - Repeat in 5 years Recommended yearly ophthalmology/optometry visit for glaucoma screening and checkup Recommended yearly dental visit for hygiene and checkup  Vaccinations: Influenza vaccine: Due every fall  Pneumococcal vaccine: Done 08/26/2011 & 12/04/2013 Tdap vaccine: Done 11/08/2017 -Repeat in 10 years Shingles vaccine: Due Shingrix discussed. Please contact your pharmacy for coverage information.    Covid-19: Declined  Advanced directives: Advance directive discussed with you today. I have provided a copy for you to complete at home and have notarized. Once this is complete please bring a copy in to our office so we can scan it into your chart.  Conditions/risks identified: Consider using a cane for added stability to prevent falls - take precautions. Try to increase physical activity as tolerated.  Next appointment: Follow up in one year for your annual wellness visit.   Preventive Care 74 Years and Older, Male  Preventive care refers to lifestyle choices and visits with your health care provider that can promote health and wellness. What does preventive care include? A yearly physical exam. This is also called an annual well check. Dental exams once or twice a year. Routine eye exams. Ask your health care provider how often you should have your eyes checked. Personal lifestyle choices, including: Daily care of your teeth and gums. Regular physical activity. Eating a healthy diet. Avoiding tobacco and drug use. Limiting alcohol use. Practicing safe sex. Taking low doses of aspirin every day. Taking vitamin and mineral supplements as recommended by your health care  provider. What happens during an annual well check? The services and screenings done by your health care provider during your annual well check will depend on your age, overall health, lifestyle risk factors, and family history of disease. Counseling  Your health care provider may ask you questions about your: Alcohol use. Tobacco use. Drug use. Emotional well-being. Home and relationship well-being. Sexual activity. Eating habits. History of falls. Memory and ability to understand (cognition). Work and work Statistician. Screening  You may have the following tests or measurements: Height, weight, and BMI. Blood pressure. Lipid and cholesterol levels. These may be checked every 5 years, or more frequently if you are over 74 years old. Skin check. Lung cancer screening. You may have this screening every year starting at age 74 if you have a 30-pack-year history of smoking and currently smoke or have quit within the past 15 years. Fecal occult blood test (FOBT) of the stool. You may have this test every year starting at age 74. Flexible sigmoidoscopy or colonoscopy. You may have a sigmoidoscopy every 5 years or a colonoscopy every 10 years starting at age 74. Prostate cancer screening. Recommendations will vary depending on your family history and other risks. Hepatitis C blood test. Hepatitis B blood test. Sexually transmitted disease (STD) testing. Diabetes screening. This is done by checking your blood sugar (glucose) after you have not eaten for a while (fasting). You may have this done every 1-3 years. Abdominal aortic aneurysm (AAA) screening. You may need this if you are a current or former smoker. Osteoporosis. You may be screened starting at age 74 if you are at high risk. Talk with your health care provider about your test results, treatment options, and if  necessary, the need for more tests. Vaccines  Your health care provider may recommend certain vaccines, such  as: Influenza vaccine. This is recommended every year. Tetanus, diphtheria, and acellular pertussis (Tdap, Td) vaccine. You may need a Td booster every 10 years. Zoster vaccine. You may need this after age 74. Pneumococcal 13-valent conjugate (PCV13) vaccine. One dose is recommended after age 74. Pneumococcal polysaccharide (PPSV23) vaccine. One dose is recommended after age 74. Talk to your health care provider about which screenings and vaccines you need and how often you need them. This information is not intended to replace advice given to you by your health care provider. Make sure you discuss any questions you have with your health care provider. Document Released: 02/21/2015 Document Revised: 10/15/2015 Document Reviewed: 11/26/2014 Elsevier Interactive Patient Education  2017 [place] Prevention in the Home Falls can cause injuries. They can happen to people of all ages. There are many things you can do to make your home safe and to help prevent falls. What can I do on the outside of my home? Regularly fix the edges of walkways and driveways and fix any cracks. Remove anything that might make you trip as you walk through a door, such as a raised step or threshold. Trim any bushes or trees on the path to your home. Use bright outdoor lighting. Clear any walking paths of anything that might make someone trip, such as rocks or tools. Regularly check to see if handrails are loose or broken. Make sure that both sides of any steps have handrails. Any raised decks and porches should have guardrails on the edges. Have any leaves, snow, or ice cleared regularly. Use sand or salt on walking paths during winter. Clean up any spills in your garage right away. This includes oil or grease spills. What can I do in the bathroom? Use night lights. Install grab bars by the toilet and in the tub and shower. Do not use towel bars as grab bars. Use non-skid mats or decals in the tub or  shower. If you need to sit down in the shower, use a plastic, non-slip stool. Keep the floor dry. Clean up any water that spills on the floor as soon as it happens. Remove soap buildup in the tub or shower regularly. Attach bath mats securely with double-sided non-slip rug tape. Do not have throw rugs and other things on the floor that can make you trip. What can I do in the bedroom? Use night lights. Make sure that you have a light by your bed that is easy to reach. Do not use any sheets or blankets that are too big for your bed. They should not hang down onto the floor. Have a firm chair that has side arms. You can use this for support while you get dressed. Do not have throw rugs and other things on the floor that can make you trip. What can I do in the kitchen? Clean up any spills right away. Avoid walking on wet floors. Keep items that you use a lot in easy-to-reach places. If you need to reach something above you, use a strong step stool that has a grab bar. Keep electrical cords out of the way. Do not use floor polish or wax that makes floors slippery. If you must use wax, use non-skid floor wax. Do not have throw rugs and other things on the floor that can make you trip. What can I do with my stairs? Do not leave any items  on the stairs. Make sure that there are handrails on both sides of the stairs and use them. Fix handrails that are broken or loose. Make sure that handrails are as long as the stairways. Check any carpeting to make sure that it is firmly attached to the stairs. Fix any carpet that is loose or worn. Avoid having throw rugs at the top or bottom of the stairs. If you do have throw rugs, attach them to the floor with carpet tape. Make sure that you have a light switch at the top of the stairs and the bottom of the stairs. If you do not have them, ask someone to add them for you. What else can I do to help prevent falls? Wear shoes that: Do not have high heels. Have  rubber bottoms. Are comfortable and fit you well. Are closed at the toe. Do not wear sandals. If you use a stepladder: Make sure that it is fully opened. Do not climb a closed stepladder. Make sure that both sides of the stepladder are locked into place. Ask someone to hold it for you, if possible. Clearly mark and make sure that you can see: Any grab bars or handrails. First and last steps. Where the edge of each step is. Use tools that help you move around (mobility aids) if they are needed. These include: Canes. Walkers. Scooters. Crutches. Turn on the lights when you go into a dark area. Replace any light bulbs as soon as they burn out. Set up your furniture so you have a clear path. Avoid moving your furniture around. If any of your floors are uneven, fix them. If there are any pets around you, be aware of where they are. Review your medicines with your doctor. Some medicines can make you feel dizzy. This can increase your chance of falling. Ask your doctor what other things that you can do to help prevent falls. This information is not intended to replace advice given to you by your health care provider. Make sure you discuss any questions you have with your health care provider. Document Released: 11/21/2008 Document Revised: 07/03/2015 Document Reviewed: 03/01/2014 Elsevier Interactive Patient Education  2017 [employer].

## 2020-08-08 ENCOUNTER — Ambulatory Visit: Payer: PPO | Admitting: Primary Care

## 2020-08-08 NOTE — Patient Instructions (Signed)
Visit Information  PATIENT GOALS:  Goals Addressed             This Visit's Progress    Manage Pulmonary Conditions   On track    Timeframe:  Long-Range Goal Priority:  Medium Start Date:    06/05/20                         Expected End Date:  06/05/21                      Follow-up: 08/14/20  Use CPAP each night Follow-up with pulmonologist in July 2022 as directed Continue to monitor pulse ox daily and PRN and to call PCP or pulmonologist with any readings outside of recommended range. Keep all medical appointments Seek appropriate medical attention for any new or worsening symptoms Take medications as prescribed Call [facility] as needed [phone] Keep appt with RNCM on 08/14/20      Track and Manage My Blood Pressure-Hypertension   On track    Timeframe:  Long-Range Goal Priority:  Medium Start Date:   05/14/20                          Expected End Date:   05/14/21                      Follow Up Date 08/14/20   check blood pressure daily and as needed write blood pressure results in a log or diary Keep all medical appointments Take medications as directed Call cardiologist with any readings outside of recommended range Seek appropriate medical attention for any new or worsening symptoms Call [facility] with any nursing needs [phone] Bring blood pressure machine to appt with RNCM on 08/14/20 to check for accuracy   Why is this important?   You won't feel high blood pressure, but it can still hurt your blood vessels.  High blood pressure can cause heart or kidney problems. It can also cause a stroke.  Making lifestyle changes like losing a little weight or eating less salt will help.  Checking your blood pressure at home and at different times of the day can help to control blood pressure.  If the doctor prescribes medicine remember to take it the way the doctor ordered.  Call the office if you cannot afford the medicine or if there are questions about it.     Notes:           Patient verbalizes understanding of instructions provided today and agrees to view in [place].   Face to Face appointment with care management team member scheduled for:  08/14/20 with RNCM  [name], BSN, RN-BC [place] / [place] Management Direct Dial: [phone]

## 2020-08-08 NOTE — Chronic Care Management (AMB) (Signed)
Chronic Care Management   CCM RN Visit Note  08/05/20 Name: Kenneth Fuller MRN: [id number] DOB: 04-Mar-1945  Subjective: Kenneth Fuller is a 75 y.o. year old male who is a primary care patient of [name], [name], MD. The care management team was consulted for assistance with disease management and care coordination needs.    Engaged with patient by telephone for follow up visit in response to provider referral for case management and/or care coordination services.   Consent to Services:  The patient was given information about Chronic Care Management services, agreed to services, and gave verbal consent prior to initiation of services.  Please see initial visit note for detailed documentation.   Patient agreed to services and verbal consent obtained.   Assessment: Review of patient past medical history, allergies, medications, health status, including review of consultants reports, laboratory and other test data, was performed as part of comprehensive evaluation and provision of chronic care management services.   SDOH (Social Determinants of Health) assessments and interventions performed:    CCM Care Plan  Allergies  Allergen Reactions   Amlodipine Besy-Benazepril Hcl Swelling and Other (See Comments)    Makes tongue swell (lotrel)   Oxycodone Itching   Phenergan [Promethazine Hcl] Other (See Comments)    "I can't remember."   Hydrocodone Itching    Can tolerate in low doses    Outpatient Encounter Medications as of 08/05/2020  Medication Sig Note   albuterol (PROVENTIL) (2.5 MG/3ML) 0.083% nebulizer solution Take 3 mLs (2.5 mg total) by nebulization every 6 (six) hours as needed for wheezing or shortness of breath.    albuterol (VENTOLIN HFA) 108 (90 Base) MCG/ACT inhaler Inhale 2 puffs into the lungs every 6 (six) hours as needed for wheezing or shortness of breath.    amoxicillin-clavulanate (AUGMENTIN) 875-125 MG tablet Take 1 tablet by mouth 2 (two) times daily.     atorvastatin (LIPITOR) 40 MG tablet Take 40 mg by mouth daily. 1 Tablet Daily    bisoprolol (ZEBETA) 5 MG tablet Take 1/2 (one-half) tablet by mouth once daily (Patient taking differently: Take 2.5 mg by mouth daily.)    Budeson-Glycopyrrol-Formoterol (BREZTRI AEROSPHERE) 160-9-4.8 MCG/ACT AERO Inhale 2 puffs into the lungs in the morning and at bedtime. (Patient not taking: Reported on 08/04/2020)    Cholecalciferol (VITAMIN D3) 125 MCG (5000 UT) TABS Take 5,000 Units by mouth daily.    cloNIDine (CATAPRES) 0.2 MG tablet Take 1 tablet (0.2 mg total) by mouth 3 (three) times daily.    colchicine 0.6 MG tablet Take 0.6 mg by mouth daily. As needed    dapagliflozin propanediol (FARXIGA) 5 MG TABS tablet Take 1 tablet (5 mg total) by mouth daily.    famotidine (PEPCID) 20 MG tablet Take 1 tablet (20 mg total) by mouth at bedtime.    FEROSUL 325 (65 Fe) MG tablet Take 325 mg by mouth 3 (three) times daily.  (Patient not taking: Reported on 08/04/2020)    furosemide (LASIX) 40 MG tablet Take 40 mg by mouth daily as needed (fluid retention.).    hydrALAZINE (APRESOLINE) 25 MG tablet Take 3 tablets (75 mg total) by mouth 3 (three) times daily.    isosorbide dinitrate (ISORDIL) 20 MG tablet Take 1 tablet (20 mg total) by mouth 3 (three) times daily.    methylPREDNISolone (MEDROL DOSEPAK) 4 MG TBPK tablet Medrol Dosepak take as instructed    MYRBETRIQ 25 MG TB24 tablet Take 1 tablet by mouth once daily    nitroGLYCERIN (NITROSTAT)  0.4 MG SL tablet Place 1 tablet (0.4 mg total) under the tongue every 5 (five) minutes x 3 doses as needed for chest pain. 07/15/2020: On hand   omeprazole (PRILOSEC) 40 MG capsule Take 1 capsule (40 mg total) by mouth 2 (two) times daily at 8 am and 10 pm. Take 30-60 minutes prior to breakfast.    ONETOUCH ULTRA test strip USE 1 STRIP TO CHECK GLUCOSE ONCE DAILY    polyethylene glycol (MIRALAX / GLYCOLAX) 17 g packet Take 17 g by mouth as needed for mild constipation.     predniSONE (DELTASONE) 20 MG tablet Take 3 tabs daily for 1 week, then 2 tabs daily for week 2, then 1 tab daily for week 3.    RESTASIS 0.05 % ophthalmic emulsion Place 1 drop into both eyes in the morning and at bedtime.    sertraline (ZOLOFT) 100 MG tablet Take 1 tablet (100 mg total) by mouth daily.    tamsulosin (FLOMAX) 0.4 MG CAPS capsule Take 1 capsule (0.4 mg total) by mouth daily after supper.    No facility-administered encounter medications on file as of 08/05/2020.    Patient Active Problem List   Diagnosis Date Noted   Asthma exacerbation 07/09/2020   Chest pain 06/06/2020   Right upper lobe pneumonia 05/07/2020   Irritable larynx 05/07/2020   AKI (acute kidney injury) ([place]) 06/05/2019   Hyperkalemia 06/05/2019   Acute renal failure (ARF) ([place]) 06/04/2019   Dizziness 06/04/2019   Near syncope 06/04/2019   Insomnia    CHF (congestive heart failure) ([place]) 01/06/2018   Depression with anxiety 01/04/2018   CKD (chronic kidney disease), stage III ([place]) 01/04/2018   Chronic obstructive pulmonary disease ([place]) 01/04/2018   Depression, recurrent ([place]) 11/11/2017   Chronic diastolic heart failure ([place]) 04/13/2017   Swelling of lower extremity 11/30/2016   Obesity, Class III, BMI 40-49.9 (morbid obesity) ([place]) 08/21/2015   Morbid obesity ([place]) 08/21/2015   Coronary artery disease involving native coronary artery of native heart without angina pectoris 12/05/2014   Urinary urgency 10/15/2014   Spinal stenosis, lumbar region, with neurogenic claudication 01/02/2014   Hyperlipidemia associated with type 2 diabetes mellitus ([place]) 10/25/2013   Hyperlipidemia, unspecified 10/25/2013   COLONIC POLYPS, ADENOMATOUS 02/25/2007   Diabetes mellitus type II, non insulin dependent ([place]) 02/25/2007   Gout 02/25/2007   Essential hypertension 02/25/2007   Cough variant asthma 02/25/2007   OSA on CPAP 02/25/2007   FATIGUE, CHRONIC 02/25/2007   HEADACHE, CHRONIC 02/25/2007   GERD  (gastroesophageal reflux disease) 02/25/2007    Conditions to be addressed/monitored:HTN and Pulmonary Disease  Care Plan : RNCM: Respiratory Disorder     Problem: Chronic Obstructive Asthma/COPD & OSA   Priority: Medium     Long-Range Goal: Manage Pulmonary Conditions   Start Date: 06/05/2020  Recent Progress: Not on track  Priority: Medium  Note:   Current Barriers:  Chronic Disease Management support and education needs related to chronic obstructive asthma/COPD and obstructive sleep apnea Does not have oxygen  Nurse Case Manager Clinical Goal(s):  patient will work with pulmonologist to address needs related to medical management of pulmonary conditions patient will meet with RN Care Manager to address self-management of pulmonary conditions Patient will work with PCP or pulmonologist regarding potential need for supplemental oxgyen Patient will continue to check and record O2 levels and will seek appropriate medical attention for readings outside of recommended range  Interventions:  1:1 collaboration with Claretta Fraise, MD regarding development and update of comprehensive plan  of care as evidenced by provider attestation and co-signature Inter-disciplinary care team collaboration (see longitudinal plan of care) Evaluation of current treatment plan related to pulmonary conditions and patient's adherence to plan as established by provider. Chart reviewed including relevant office notes and lab results Discussed that patient did not meed medical qualifications for a portable oxygen concentrator at most recent PCP visit Reviewed and discussed medications Compliant with medications Encouraged to continue monitoring oxygen with pulse ox daily and PRN Discussed plans with patient for ongoing care management follow up and provided patient with direct contact information for care management team Provided with RNCM contact number and encouraged to reach out as needed  Self Care  Activities:  Self administers medications as prescribed Performs ADL's independently Performs IADL's independently Calls provider office for new concerns or questions  Patient Goals Over the next 30 days, patient will: Use CPAP each night Follow-up with pulmonologist in July 2022 as directed Continue to monitor pulse ox daily and PRN and to call PCP or pulmonologist with any readings outside of recommended range. Keep all medical appointments Seek appropriate medical attention for any new or worsening symptoms Take medications as prescribed Call [facility] as needed [phone] Keep appt with RNCM on 08/14/20  Follow Up Plan:  In person office visit scheduled with RNCM for 08/14/20 The patient has been provided with contact information for the care management team and has been advised to call with any health related questions or concerns.     Care Plan : RNCM: Hypertension (Adult)  Updates made by Ilean China, RN since 08/08/2020 12:00 AM     Problem: Hypertension (Hypertension)   Priority: Medium     Long-Range Goal: Hypertension Managed   Start Date: 05/14/2020  This Visit's Progress: On track  Recent Progress: Not on track  Priority: Medium  Note:   Objective:  BP Readings from Last 3 Encounters:  08/04/20 128/80  07/29/20 (!) 158/79  07/15/20 (!) 150/90   Current Barriers:  Knowledge Deficits related to uncontrolled hypertension Chronic Disease Management support and education needs related to hypertension Blood pressure variations between home readings and office readings  Case Manager Clinical Goal(s):  patient will demonstrate improved adherence to prescribed treatment plan for hypertension as evidenced by taking all medications as prescribed, monitoring and recording blood pressure as directed, adhering to low sodium/DASH diet patient will demonstrate improved health management independence as evidenced by checking blood pressure as directed and notifying PCP if  SBP>160 or DBP > 90, taking all medications as prescribe, and adhering to a low sodium diet as discussed. Patient will work with cardiologist regarding medical management of hypertension Patient will work with RN Care Manager regarding self management of hypertension  Interventions:  Collaboration with [name], [name], MD regarding development and update of comprehensive plan of care as evidenced by provider attestation and co-signature Inter-disciplinary care team collaboration (see longitudinal plan of care) Evaluation of current treatment plan related to hypertension self management and patient's adherence to plan as established by provider. Reviewed medications with patient and verified compliance Discussed plans with patient for ongoing care management follow up and provided patient with direct contact information for care management team Advised patient, providing education and rationale, to monitor blood pressure daily and record, calling PCP for findings outside established parameters.  Discussed recent home blood pressure readings Persistently elevated with readings up to 180/110 Averaging around 150/90 Discussed recent cardiology office visit and in-office blood pressure readings Patient scheduled for an in office visit with [facility]  Patient advised to bring blood pressure cuff in to that visit so that we can check it for accuracy Encouraged patient to reach out to cardiologist with any new or worsening symptoms or to seek emergency medical attention as necessary Encouraged patient to reach out to [facility] as needed  Self-Care Activities: Self administers medications as prescribed Attends all scheduled provider appointments Calls provider office for new concerns, questions, or BP outside discussed parameters Checks BP and records as discussed  Patient Goals: check blood pressure daily and as needed write blood pressure results in a log or diary Keep all medical appointments Take  medications as directed Call cardiologist with any readings outside of recommended range Seek appropriate medical attention for any new or worsening symptoms Call [facility] with any nursing needs [phone]  Follow Up Plan:  In person visit scheduled with RNCM for 08/14/20  The patient has been provided with contact information for the care management team and has been advised to call with any health related questions or concerns.        Plan:Face to Face appointment with care management team member scheduled for: 08/14/20 with RNCM  [name], BSN, RN-BC [place] / [place] Management Direct Dial: [phone]

## 2020-08-10 ENCOUNTER — Other Ambulatory Visit: Payer: Self-pay | Admitting: Family Medicine

## 2020-08-12 NOTE — Telephone Encounter (Signed)
Pt last seen in June. A physician, ( Dr. Earnest Conroy on 05/31/20) in the hospital wanted pt to discontinue medication at discharge.  Do we need to refill or deny medication?  Pt was last seen in the office on 07/14/20.  Next appt is on 7/11

## 2020-08-13 ENCOUNTER — Ambulatory Visit: Payer: PPO | Admitting: Pulmonary Disease

## 2020-08-13 ENCOUNTER — Other Ambulatory Visit: Payer: Self-pay

## 2020-08-13 ENCOUNTER — Encounter: Payer: Self-pay | Admitting: Pulmonary Disease

## 2020-08-13 DIAGNOSIS — G4733 Obstructive sleep apnea (adult) (pediatric): Secondary | ICD-10-CM

## 2020-08-13 DIAGNOSIS — Z9989 Dependence on other enabling machines and devices: Secondary | ICD-10-CM | POA: Diagnosis not present

## 2020-08-13 NOTE — Progress Notes (Signed)
Subjective:    Patient ID: Kenneth Fuller, male    DOB: [birth year], 75 y.o.   MRN: [id number]  HPI 75 year old remote smoker presents to establish care for asthma/ COPD and OSA He has previously seen Dr. Chase Caller and other providers at [facility] location  PMH - chronic diastolic heart failure coronary artery disease diabetes, chronic stage III kidney disease.  He was treated for pneumonia in 05/2020.  He is maintained on Breztri. He has been maintained on CPAP for many years, recently underwent ONO 05/09/20 - Patient spent 16 mins with SpO2 < 89. Lowest O2 72%, mean 91.5%.this was off CPAP & on RA  a repeat titration study  was ordered and was changed to BiPAP 15/11, no oxygen was required He feels much more comfortable and is resting better with BiPAP.  He smoked less than 10 pack years before he quit in 1971.  He reports lifelong history of asthma.  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  He was seen 6/21 by his PCP and given a prednisone taper for COPD exacerbation.  He started 60 mg and is now down to 20 mg in the third week  Significant tests/ events reviewed  NPSG 06/2020 >> BiPAP 15/11 ,med FF mask   12/2016 PFT >> ratio 84, severe restriction with reduced diffusion capacity.  Total lung capacity 72% FVC, 1.6 L / 40% and DLCO of 19.4/68%.   PFT 1/ 2019 showed FEV1 45%, ratio 68, FVC 48% consistent with moderate to severe airflow obstruction   Feno 01/26/2017 ->  8ppb     High resolution CT chest 01/2017 neg for ILD  CT angiogram chest 05/2020 clear lungs CT sinus March 09, 2017 clear sinuses    Previous sleep study showed very severe OSA, AHI >100. Split night study showed optimal control with CPAP 5-20   Past Medical History:  Diagnosis Date   Allergy    Asthma    CAD (coronary artery disease)    CHF (congestive heart failure) (HCC)    COPD (chronic obstructive pulmonary disease) (HCC)    Diabetes mellitus     Fibromyalgia    GERD (gastroesophageal reflux disease)    GI bleeding    Gout    Hyperlipidemia    Hypertension    Hypogonadism male    Insomnia    MRSA cellulitis    Neuropathy    Obesity    Shortness of breath dyspnea    with exertion    Sleep apnea    cpap- 14    Wheezing    no asthma diagnosis     Review of Systems Constitutional: negative for anorexia, fevers and sweats  Eyes: negative for irritation, redness and visual disturbance  Ears, nose, mouth, throat, and face: negative for earaches, epistaxis, nasal congestion and sore throat  Respiratory: negative for cough, sputum and wheezing  Cardiovascular: negative for chest pain, dyspnea, lower extremity edema, orthopnea, palpitations and syncope  Gastrointestinal: negative for abdominal pain, constipation, diarrhea, melena, nausea and vomiting  Genitourinary:negative for dysuria, frequency and hematuria  Hematologic/lymphatic: negative for bleeding, easy bruising and lymphadenopathy  Musculoskeletal:negative for arthralgias, muscle weakness and stiff joints  Neurological: negative for coordination problems, gait problems, headaches and weakness  Endocrine: negative for diabetic symptoms including polydipsia, polyuria and weight loss     Objective:   Physical Exam  Gen. Pleasant, obese, in no distress, normal affect ENT - no pallor,icterus, no post nasal  drip, class 2 airway Neck: No JVD, no thyromegaly, no carotid bruits Lungs: no use of accessory muscles, no dullness to percussion, decreased without rales or rhonchi  Cardiovascular: Rhythm regular, heart sounds  normal, no murmurs or gallops, 1+ peripheral edema Abdomen: soft and non-tender, no hepatosplenomegaly, BS normal. Musculoskeletal: No deformities, no cyanosis or clubbing Neuro:  alert, non focal, no tremors       Assessment & Plan:

## 2020-08-13 NOTE — Telephone Encounter (Signed)
I would say we need to call them for sure and make sure that he is still taking or not taking this medicine, if he does not need until J July 11 then we can discuss it in that visit.

## 2020-08-13 NOTE — Assessment & Plan Note (Signed)
He has been using BiPAP now for the last 2 weeks and feels much more comfortable and better rested. BiPAP download was reviewed which shows good control of events, minimal leak and excellent compliance about 7 hours every night  Weight loss encouraged, compliance with goal of at least 4-6 hrs every night is the expectation. Advised against medications with sedative side effects Cautioned against driving when sleepy - understanding that sleepiness will vary on a day to day basis

## 2020-08-13 NOTE — Patient Instructions (Signed)
  BiPAP is working well  [employer] course of prednisone Take a dose of lasix this week Continue on [employer]

## 2020-08-14 ENCOUNTER — Ambulatory Visit: Payer: PPO

## 2020-08-18 ENCOUNTER — Other Ambulatory Visit: Payer: Self-pay

## 2020-08-18 ENCOUNTER — Encounter: Payer: Self-pay | Admitting: Family Medicine

## 2020-08-18 ENCOUNTER — Ambulatory Visit (INDEPENDENT_AMBULATORY_CARE_PROVIDER_SITE_OTHER): Payer: PPO | Admitting: Family Medicine

## 2020-08-18 ENCOUNTER — Other Ambulatory Visit: Payer: Self-pay | Admitting: Family Medicine

## 2020-08-18 VITALS — BP 126/76 | HR 68 | Ht 67.0 in | Wt 269.0 lb

## 2020-08-18 DIAGNOSIS — J449 Chronic obstructive pulmonary disease, unspecified: Secondary | ICD-10-CM

## 2020-08-18 DIAGNOSIS — I10 Essential (primary) hypertension: Secondary | ICD-10-CM | POA: Diagnosis not present

## 2020-08-18 DIAGNOSIS — R3915 Urgency of urination: Secondary | ICD-10-CM | POA: Diagnosis not present

## 2020-08-18 MED ORDER — ISOSORBIDE DINITRATE 20 MG PO TABS: 20.0000 mg | ORAL_TABLET | Freq: Three times a day (TID) | ORAL | 3 refills | Status: DC

## 2020-08-18 NOTE — Progress Notes (Signed)
BP 126/76   Pulse 68   Ht _0  (1.702 m)   Wt 269 lb (122 kg)   SpO2 96%   BMI 42.13 kg/m    Subjective:   Patient ID: Kenneth Fuller, male    DOB: [birth year], 75 y.o.   MRN: [id number]  HPI: SUSIE POUSSON is a 75 y.o. male presenting on 08/18/2020 for Shortness of Breath (6 wk f/u), COPD, and Dizziness   HPI Shortness of breath and COPD recheck Patient is coming in today for shortness of breath and COPD recheck.  He does feel like his breathing is doing a lot better, he just did finished prednisone yesterday.  He has been using his breathing treatments as well.  Sometimes he will have bouts that he complains of where he feels lightheaded and nausea his heart is fluttering and they happen but then they pass quickly within a minute.  He is still having those but is not having the wheezing and shortness of breath like he was having.  He does still feel like his breathing treatments are helping.  Patient is also coming in today complaining of urinary frequency and urgency and significant make it to the restroom because he has to go to bed.  He is on a diuretic but does not like to take it all per day because of that.  He is also supposed to be on Myrbetriq and tamsulosin and then cannot recall whether he actually takes them or not.  His blood pressure has been elevated at home although it is 126/76 today.  It does look like he was post be on isosorbide dinitrate but he has not had it and would like a prescription for that.  Relevant past medical, surgical, family and social history reviewed and updated as indicated. Interim medical history since our last visit reviewed. Allergies and medications reviewed and updated.  Review of Systems  Constitutional:  Negative for chills and fever.  Eyes:  Negative for visual disturbance.  Respiratory:  Positive for shortness of breath and wheezing.   Cardiovascular:  Negative for chest pain and leg swelling.  Genitourinary:  Positive for  frequency and urgency. Negative for dysuria, flank pain and hematuria.  Musculoskeletal:  Negative for back pain and gait problem.  Skin:  Negative for rash.  Neurological:  Positive for dizziness. Negative for weakness and light-headedness.  All other systems reviewed and are negative.  Per HPI unless specifically indicated above   Allergies as of 08/18/2020       Reactions   Amlodipine Besy-benazepril Hcl Swelling, Other (See Comments)   Makes tongue swell (lotrel)   Oxycodone Itching   Phenergan [promethazine Hcl] Other (See Comments)   "I can't remember."   Hydrocodone Itching   Can tolerate in low doses        Medication List        Accurate as of August 18, 2020  2:04 PM. If you have any questions, ask your nurse or doctor.          STOP taking these medications    methylPREDNISolone 4 MG Tbpk tablet Commonly known as: MEDROL DOSEPAK Stopped by: [name] Jaidy Cottam, MD   predniSONE 20 MG tablet Commonly known as: DELTASONE Stopped by: [name] Terris Germano, MD       TAKE these medications    albuterol 108 (90 Base) MCG/ACT inhaler Commonly known as: VENTOLIN HFA Inhale 2 puffs into the lungs every 6 (six) hours as needed for wheezing or shortness of breath.  albuterol (2.5 MG/3ML) 0.083% nebulizer solution Commonly known as: PROVENTIL Take 3 mLs (2.5 mg total) by nebulization every 6 (six) hours as needed for wheezing or shortness of breath.   amoxicillin-clavulanate 875-125 MG tablet Commonly known as: AUGMENTIN Take 1 tablet by mouth 2 (two) times daily.   atorvastatin 40 MG tablet Commonly known as: LIPITOR Take 40 mg by mouth daily. 1 Tablet Daily   bisoprolol 5 MG tablet Commonly known as: ZEBETA Take 1/2 (one-half) tablet by mouth once daily What changed: See the new instructions.   Breztri Aerosphere 160-9-4.8 MCG/ACT Aero Generic drug: Budeson-Glycopyrrol-Formoterol Inhale 2 puffs into the lungs in the morning and at bedtime.    cloNIDine 0.2 MG tablet Commonly known as: CATAPRES Take 1 tablet (0.2 mg total) by mouth 3 (three) times daily.   colchicine 0.6 MG tablet Take 0.6 mg by mouth daily. As needed   dapagliflozin propanediol 5 MG Tabs tablet Commonly known as: Farxiga Take 1 tablet (5 mg total) by mouth daily.   famotidine 20 MG tablet Commonly known as: PEPCID Take 1 tablet (20 mg total) by mouth at bedtime.   FeroSul 325 (65 FE) MG tablet Generic drug: ferrous sulfate Take 325 mg by mouth 3 (three) times daily.   furosemide 40 MG tablet Commonly known as: LASIX Take 40 mg by mouth daily as needed (fluid retention.).   hydrALAZINE 25 MG tablet Commonly known as: APRESOLINE Take 3 tablets (75 mg total) by mouth 3 (three) times daily.   isosorbide dinitrate 20 MG tablet Commonly known as: ISORDIL Take 1 tablet (20 mg total) by mouth 3 (three) times daily.   Myrbetriq 25 MG Tb24 tablet Generic drug: mirabegron ER Take 1 tablet by mouth once daily   nitroGLYCERIN 0.4 MG SL tablet Commonly known as: NITROSTAT Place 1 tablet (0.4 mg total) under the tongue every 5 (five) minutes x 3 doses as needed for chest pain.   omeprazole 40 MG capsule Commonly known as: PRILOSEC Take 1 capsule (40 mg total) by mouth 2 (two) times daily at 8 am and 10 pm. Take 30-60 minutes prior to breakfast.   OneTouch Ultra test strip Generic drug: glucose blood USE 1 STRIP TO CHECK GLUCOSE ONCE DAILY   polyethylene glycol 17 g packet Commonly known as: MIRALAX / GLYCOLAX Take 17 g by mouth as needed for mild constipation.   Restasis 0.05 % ophthalmic emulsion Generic drug: cycloSPORINE Place 1 drop into both eyes in the morning and at bedtime.   sertraline 100 MG tablet Commonly known as: ZOLOFT Take 1 tablet (100 mg total) by mouth daily.   tamsulosin 0.4 MG Caps capsule Commonly known as: FLOMAX Take 1 capsule (0.4 mg total) by mouth daily after supper.   Vitamin D3 125 MCG (5000 UT) Tabs Take  5,000 Units by mouth daily.         Objective:   BP 126/76   Pulse 68   Ht 5' 7" (1.702 m)   Wt 269 lb (122 kg)   SpO2 96%   BMI 42.13 kg/m   Wt Readings from Last 3 Encounters:  08/18/20 269 lb (122 kg)  08/13/20 266 lb 6.4 oz (120.8 kg)  08/07/20 268 lb (121.6 kg)    Physical Exam Vitals and nursing note reviewed.  Constitutional:      General: He is not in acute distress.    Appearance: He is well-developed. He is not diaphoretic.  Eyes:     General: No scleral icterus.  Right eye: No discharge.     Conjunctiva/sclera: Conjunctivae normal.     Pupils: Pupils are equal, round, and reactive to light.  Neck:     Thyroid: No thyromegaly.  Cardiovascular:     Rate and Rhythm: Normal rate and regular rhythm.     Heart sounds: Normal heart sounds. No murmur heard. Pulmonary:     Effort: Pulmonary effort is normal. No respiratory distress.     Breath sounds: Normal breath sounds. No wheezing.  Musculoskeletal:        General: Normal range of motion.     Cervical back: Neck supple.  Lymphadenopathy:     Cervical: No cervical adenopathy.  Skin:    General: Skin is warm and dry.     Findings: No rash.  Neurological:     Mental Status: He is alert and oriented to person, place, and time.     Coordination: Coordination normal.  Psychiatric:        Behavior: Behavior normal.    Results for orders placed or performed in visit on 07/14/20  CMP14+EGFR  Result Value Ref Range   Glucose 191 (H) 65 - 99 mg/dL   BUN 31 (H) 8 - 27 mg/dL   Creatinine, Ser 1.25 0.76 - 1.27 mg/dL   eGFR 60 >59 mL/min/1.73   BUN/Creatinine Ratio 25 (H) 10 - 24   Sodium 139 134 - 144 mmol/L   Potassium 5.5 (H) 3.5 - 5.2 mmol/L   Chloride 96 96 - 106 mmol/L   CO2 25 20 - 29 mmol/L   Calcium 9.8 8.6 - 10.2 mg/dL   Total Protein 6.8 6.0 - 8.5 g/dL   Albumin 4.6 3.7 - 4.7 g/dL   Globulin, Total 2.2 1.5 - 4.5 g/dL   Albumin/Globulin Ratio 2.1 1.2 - 2.2   Bilirubin Total 0.5 0.0 - 1.2  mg/dL   Alkaline Phosphatase 98 44 - 121 IU/L   AST 20 0 - 40 IU/L   ALT 33 0 - 44 IU/L  CBC with Differential/Platelet  Result Value Ref Range   WBC 16.7 (H) 3.4 - 10.8 x10E3/uL   RBC 5.62 4.14 - 5.80 x10E6/uL   Hemoglobin 16.3 13.0 - 17.7 g/dL   Hematocrit 48.5 37.5 - 51.0 %   MCV 86 79 - 97 fL   MCH 29.0 26.6 - 33.0 pg   MCHC 33.6 31.5 - 35.7 g/dL   RDW 15.1 11.6 - 15.4 %   Platelets 250 150 - 450 x10E3/uL   Neutrophils 77 Not Estab. %   Lymphs 14 Not Estab. %   Monocytes 6 Not Estab. %   Eos 0 Not Estab. %   Basos 1 Not Estab. %   Neutrophils Absolute 12.9 (H) 1.4 - 7.0 x10E3/uL   Lymphocytes Absolute 2.3 0.7 - 3.1 x10E3/uL   Monocytes Absolute 0.9 0.1 - 0.9 x10E3/uL   EOS (ABSOLUTE) 0.1 0.0 - 0.4 x10E3/uL   Basophils Absolute 0.1 0.0 - 0.2 x10E3/uL   Immature Granulocytes 2 Not Estab. %   Immature Grans (Abs) 0.3 (H) 0.0 - 0.1 x10E3/uL    Assessment & Plan:   Problem List Items Addressed This Visit       Cardiovascular and Mediastinum   Essential hypertension   Relevant Medications   isosorbide dinitrate (ISORDIL) 20 MG tablet     Respiratory   Chronic obstructive pulmonary disease (HCC) - Primary     Other   Urinary urgency    Continue current medication, seems to be doing better, keep an eye  on the dizziness and lightheadedness and let us know if it improves or worsens.  Patient will check for urinary medicines at home but if he does not have them then he will call back for the refills or if he has been taking them we will consider urology referral. Follow up plan: Return in about 5 weeks (around 09/22/2020), or if symptoms worsen or fail to improve, for Hypertension and COPD recheck.  Counseling provided for all of the vaccine components No orders of the defined types were placed in this encounter.   Caryl Pina, MD [facility] 08/18/2020, 2:04 PM

## 2020-08-19 ENCOUNTER — Ambulatory Visit (INDEPENDENT_AMBULATORY_CARE_PROVIDER_SITE_OTHER): Payer: PPO | Admitting: Pharmacist

## 2020-08-19 DIAGNOSIS — E1142 Type 2 diabetes mellitus with diabetic polyneuropathy: Secondary | ICD-10-CM

## 2020-08-19 DIAGNOSIS — F339 Major depressive disorder, recurrent, unspecified: Secondary | ICD-10-CM

## 2020-08-19 MED ORDER — SERTRALINE HCL 100 MG PO TABS: 100.0000 mg | ORAL_TABLET | Freq: Every day | ORAL | 3 refills | Status: DC

## 2020-08-19 NOTE — Progress Notes (Signed)
Chronic Care Management Pharmacy Note  08/19/2020 Name:  Kenneth Fuller MRN:  [id number] DOB:  1945-07-06  Summary: DIABETES MANAGEMENT   Recommendations/Changes made from today's visit: Diabetes: Uncontrolled-A1C 7.9%; current treatment: OZEMPIC 0.25MG SQ WEEKLY, FARXIGA 5MG DAILY;  Patient to restart Ozempic 0.7m sq weekly--has not been taking, now BG elevated  Denies personal and family history of Medullary thyroid cancer (MTC) Current glucose readings: fasting glucose: up to 200s, post prandial glucose: N/A Denies hypoglycemic/hyperglycemic symptoms Discussed meal planning options and Plate method for healthy eating Avoid sugary drinks and desserts Incorporate balanced protein, non starchy veggies, 1 serving of carbohydrate with each meal Increase water intake Increase physical activity as able Current exercise: UNABLE DUE TO CURRENT STATE--BLOOD PRESSURE LABILE; ENERGY LEVEL DECREASED Educated on medications-purpose & side effects Assessed patient finances. [facility]  They will no longer be servicing patient after this year and with certain medications. PLAN[facility]  Patient has enough Ozempic 0.231mto last until we can reapply in November 2022.  We will be ordering patient's Breztri via As[employer] Warner[employer]ient assistance program.  Hypertension: Uncontrolled- BP goal <130/80; current treatment: bisoprolol, clonidine, hydralazine, isosorbide Current home readings:  traditional automatic cuff (reports labile from 100s-170s SBP) wrist cuff: 140/82, 123/71; PCP in office today 123/71 Reports hypotensive/hypertensive symptoms Counseled on taking all BP medications as prescribed and at the same time of day to keep steady control; BP is managed by cardiology, therefore patient instructed to call cards for any BP needs; patient to keep strict log of BP times and readings  Plan: F/U  NEXT WEEK  Subjective: Kenneth Fuller an [age].o. year old male who is a primary patient of [name], JoFransisca KaufmannMD.  The CCM team was consulted for assistance with disease management and care coordination needs.    Engaged with patient face to face for initial visit in response to provider referral for pharmacy case management and/or care coordination services.   Consent to Services:  The patient was given information about Chronic Care Management services, agreed to services, and gave verbal consent prior to initiation of services.  Please see initial visit note for detailed documentation.   Patient Care Team: [name], JoFransisca KaufmannMD as PCP - General (Family Medicine) Croitoru, MiDani GobbleMD as PCP - Cardiology (Cardiology) LeHarlen LabsMD as Referring Physician (Optometry) FoShea EvansMiNorva RiffleLCSW as Social Worker (Licensed Clinical Social Worker) HuCori RazorKrDelice BisonRN as Case Manager Croitoru, MiDani GobbleMD as Consulting Physician (Cardiology) PrLavera GuiseRPSt Patrick HospitalPharmacist) BhLiana GeroldMD as Consulting Physician (Nephrology) JaMilus BanisterMD as Attending Physician (Gastroenterology) AlRigoberto NoelMD as Consulting Physician (Pulmonary Disease)  Objective:  Lab Results  Component Value Date   CREATININE 1.25 07/14/2020   CREATININE 1.24 07/10/2020   CREATININE 1.14 07/09/2020    Lab Results  Component Value Date   HGBA1C 7.9 (H) 07/09/2020   Last diabetic Eye exam:  Lab Results  Component Value Date/Time   HMDIABEYEEXA No Retinopathy 03/23/2018 12:00 AM    Last diabetic Foot exam: No results found for: HMDIABFOOTEX      Component Value Date/Time   CHOL 90 (L) 10/04/2019 1349   CHOL 111 06/12/2012 1232   TRIG 142 10/04/2019 1349   TRIG 141 01/24/2014 1143   TRIG 192 (H) 06/12/2012 1232   HDL 40 10/04/2019 1349   HDL 38 (L) 01/24/2014 1143  HDL 33 (L) 06/12/2012 1232   CHOLHDL 2.3 10/04/2019 1349   CHOLHDL 4.8 11/22/2006 1712   VLDL 39  11/22/2006 1712   LDLCALC 26 10/04/2019 1349   LDLCALC 27 10/25/2013 1224   LDLCALC 40 06/12/2012 1232    Hepatic Function Latest Ref Rng & Units 07/14/2020 07/10/2020 06/06/2020  Total Protein 6.0 - 8.5 g/dL 6.8 7.6 6.6  Albumin 3.7 - 4.7 g/dL 4.6 4.2 3.9  AST 0 - 40 IU/L _0 ALT 0 - 44 IU/L 33 28 24  Alk Phosphatase 44 - 121 IU/L 98 89 73  Total Bilirubin 0.0 - 1.2 mg/dL 0.5 0.7 0.9  Bilirubin, Direct 0.00 - 0.40 mg/dL - - -    Lab Results  Component Value Date/Time   TSH 3.30 02/25/2017 10:28 AM   TSH 2.300 08/21/2015 01:52 PM    CBC Latest Ref Rng & Units 07/14/2020 07/10/2020 07/09/2020  WBC 3.4 - 10.8 x10E3/uL 16.7(H) 12.4(H) 8.8  Hemoglobin 13.0 - 17.7 g/dL 16.3 16.4 15.1  Hematocrit 37.5 - 51.0 % 48.5 51.2 46.9  Platelets 150 - 450 x10E3/uL 250 211 185    No results found for: VD25OH  Clinical ASCVD: No  The ASCVD Risk score Mikey Bussing DC Jr., et al., 2013) failed to calculate for the following reasons:   The valid total cholesterol range is 130 to 320 mg/dL    Other: (CHADS2VASc if Afib, PHQ9 if depression, MMRC or CAT for COPD, ACT, DEXA)  Social History   Tobacco Use  Smoking Status Former   Packs/day: 1.00   Types: Cigarettes   Start date: 02/09/1964   Quit date: 02/08/1969   Years since quitting: 51.5  Smokeless Tobacco Former   Quit date: 1971   BP Readings from Last 3 Encounters:  08/18/20 126/76  08/13/20 138/90  08/04/20 128/80   Pulse Readings from Last 3 Encounters:  08/18/20 68  08/13/20 74  08/04/20 77   Wt Readings from Last 3 Encounters:  08/18/20 269 lb (122 kg)  08/13/20 266 lb 6.4 oz (120.8 kg)  08/07/20 268 lb (121.6 kg)    Assessment: Review of patient past medical history, allergies, medications, health status, including review of consultants reports, laboratory and other test data, was performed as part of comprehensive evaluation and provision of chronic care management services.   SDOH:  (Social Determinants of Health) assessments  and interventions performed:    CCM Care Plan  Allergies  Allergen Reactions   Amlodipine Besy-Benazepril Hcl Swelling and Other (See Comments)    Makes tongue swell (lotrel)   Oxycodone Itching   Phenergan [Promethazine Hcl] Other (See Comments)    "I can't remember."   Hydrocodone Itching    Can tolerate in low doses    Medications Reviewed Today     Reviewed by [name], [facility] (Pharmacist) on 08/19/20 at [place] List Status: <None>   Medication Order Taking? Sig Documenting Provider Last Dose Status Informant  albuterol (PROVENTIL) (2.5 MG/3ML) 0.083% nebulizer solution [id number]  Take 3 mLs (2.5 mg total) by nebulization every 6 (six) hours as needed for wheezing or shortness of breath. [name], [name], MD  Active   albuterol (VENTOLIN HFA) 108 (90 Base) MCG/ACT inhaler [id number]  Inhale 2 puffs into the lungs every 6 (six) hours as needed for wheezing or shortness of breath. Claretta Fraise, MD  Active Self    Discontinued 08/19/20 1339 (Completed Course)   atorvastatin (LIPITOR) 40 MG tablet [id number]  Take 40 mg by mouth daily.  1 Tablet Daily [provider]  Active Self  bisoprolol (ZEBETA) 5 MG tablet [id number]  Take 1/2 (one-half) tablet by mouth once daily  Patient taking differently: Take 2.5 mg by mouth daily.   [name], [name], MD  Active Self  Budeson-Glycopyrrol-Formoterol (BREZTRI AEROSPHERE) 160-9-4.8 MCG/ACT Hollie Salk [id number]  Inhale 2 puffs into the lungs in the morning and at bedtime. [name], [name], MD  Active   Cholecalciferol (VITAMIN D3) 125 MCG (5000 UT) TABS [id number]  Take 5,000 Units by mouth daily. [provider]  Active Self  cloNIDine (CATAPRES) 0.2 MG tablet [id number]  Take 1 tablet (0.2 mg total) by mouth 3 (three) times daily. [name], [name], MD  Active   colchicine 0.6 MG tablet [id number]  Take 0.6 mg by mouth daily. As needed [provider]  Active Self  dapagliflozin propanediol (FARXIGA) 5 MG TABS  tablet [id number]  Take 1 tablet (5 mg total) by mouth daily. [name], [name], MD  Active Self  famotidine (PEPCID) 20 MG tablet [id number]  Take 1 tablet (20 mg total) by mouth at bedtime. Martyn Ehrich, NP  Active Self  FEROSUL 325 (65 Fe) MG tablet [id number]  Take 325 mg by mouth 3 (three) times daily. [provider]  Active   furosemide (LASIX) 40 MG tablet [id number]  Take 40 mg by mouth daily as needed (fluid retention.). [provider]  Active Self  hydrALAZINE (APRESOLINE) 25 MG tablet [id number]  Take 3 tablets (75 mg total) by mouth 3 (three) times daily. Croitoru, Mihai, MD  Active   isosorbide dinitrate (ISORDIL) 20 MG tablet [id number]  Take 1 tablet (20 mg total) by mouth 3 (three) times daily. [name], [name], MD  Active   MYRBETRIQ 25 MG TB24 tablet [id number]  Take 1 tablet by mouth once daily [name], [name], MD  Active   nitroGLYCERIN (NITROSTAT) 0.4 MG SL tablet [id number]  Place 1 tablet (0.4 mg total) under the tongue every 5 (five) minutes x 3 doses as needed for chest pain. Manuella Ghazi, Pratik D, DO  Expired 07/07/20 2359            Med Note (DAVIS, SOPHIA A   Tue Jul 15, 2020  8:58 AM) On hand  omeprazole (PRILOSEC) 40 MG capsule [id number]  Take 1 capsule (40 mg total) by mouth 2 (two) times daily at 8 am and 10 pm. Take 30-60 minutes prior to breakfast. Ladene Artist, MD  Active Self  [facility] ULTRA test strip [id number]  USE 1 STRIP TO CHECK GLUCOSE ONCE DAILY [name], [name], MD  Active   polyethylene glycol (MIRALAX / GLYCOLAX) 17 g packet [id number]  Take 17 g by mouth as needed for mild constipation. [provider]  Active Self  RESTASIS 0.05 % ophthalmic emulsion [id number]  Place 1 drop into both eyes in the morning and at bedtime. [provider]  Active Self  sertraline (ZOLOFT) 100 MG tablet [id number]  Take 1 tablet (100 mg total) by mouth daily. [name], [name], MD  Active   tamsulosin (FLOMAX) 0.4 MG CAPS capsule  [id number]  Take 1 capsule (0.4 mg total) by mouth daily after supper. [name], [name], MD  Active Self           Med Note Tressie Ellis Dec 14, 2019  4:07 PM)              Patient Active Problem List   Diagnosis Date Noted   Asthma exacerbation  07/09/2020   Chest pain 06/06/2020   Right upper lobe pneumonia 05/07/2020   Irritable larynx 05/07/2020   Hyperkalemia 06/05/2019   Dizziness 06/04/2019   Near syncope 06/04/2019   Insomnia    CHF (congestive heart failure) ([place]) 01/06/2018   Depression with anxiety 01/04/2018   CKD (chronic kidney disease), stage III ([place]) 01/04/2018   Chronic obstructive pulmonary disease ([place]) 01/04/2018   Depression, recurrent ([place]) 11/11/2017   Chronic diastolic heart failure ([place]) 04/13/2017   Swelling of lower extremity 11/30/2016   Obesity, Class III, BMI 40-49.9 (morbid obesity) ([place]) 08/21/2015   Morbid obesity ([place]) 08/21/2015   Coronary artery disease involving native coronary artery of native heart without angina pectoris 12/05/2014   Urinary urgency 10/15/2014   Spinal stenosis, lumbar region, with neurogenic claudication 01/02/2014   Hyperlipidemia associated with type 2 diabetes mellitus ([place]) 10/25/2013   Hyperlipidemia, unspecified 10/25/2013   COLONIC POLYPS, ADENOMATOUS 02/25/2007   Diabetes mellitus type II, non insulin dependent ([place]) 02/25/2007   Gout 02/25/2007   Essential hypertension 02/25/2007   Cough variant asthma 02/25/2007   OSA on CPAP 02/25/2007   FATIGUE, CHRONIC 02/25/2007   HEADACHE, CHRONIC 02/25/2007   GERD (gastroesophageal reflux disease) 02/25/2007    Immunization History  Administered Date(s) Administered   Fluad Quad(high Dose 65+) 11/15/2018   Influenza Whole 10/09/2008   Influenza, High Dose Seasonal PF 11/29/2016, 11/08/2017   Influenza,inj,Quad PF,6+ Mos 12/04/2013, 12/04/2014, 12/23/2015   Pneumococcal Conjugate-13 12/04/2013   Pneumococcal Polysaccharide-23 08/26/2011   Td  11/08/2017    Conditions to be addressed/monitored: HTN and DMII  Care Plan : PHARMD MEDICATION MANAGMENT  Updates made by [name], [place] since 08/23/2020 12:00 AM     Problem: DISEASE PREVENTION PROGRESSION      Long-Range Goal: T2DM, HTN   This Visit's Progress: Not on track  Priority: High  Note:   Current Barriers:  Unable to independently afford treatment regimen Unable to maintain control of T2DM  Pharmacist Clinical Goal(s):  Over the next 90 days, patient will verbalize ability to afford treatment regimen maintain control of T2DM as evidenced by IMPROVED GLYCEMIC CONTROL  through collaboration with PharmD and provider.    Interventions: 1:1 collaboration with [name], [name], MD regarding development and update of comprehensive plan of care as evidenced by provider attestation and co-signature Inter-disciplinary care team collaboration (see longitudinal plan of care) Comprehensive medication review performed; medication list updated in electronic medical record  Diabetes: Uncontrolled-A1C 7.9%; current treatment: OZEMPIC 0.25MG SQ WEEKLY, FARXIGA 5MG DAILY;  Patient to restart Ozempic 0.2m sq weekly--has not been taking, now BG elevated  Denies personal and family history of Medullary thyroid cancer (MTC) Current glucose readings: fasting glucose: up to 200s, post prandial glucose: N/A Denies hypoglycemic/hyperglycemic symptoms Discussed meal planning options and Plate method for healthy eating Avoid sugary drinks and desserts Incorporate balanced protein, non starchy veggies, 1 serving of carbohydrate with each meal Increase water intake Increase physical activity as able Current exercise: UNABLE DUE TO CURRENT STATE--BLOOD PRESSURE LABILE; ENERGY LEVEL DECREASED Educated on medications-purpose & side effects Assessed patient finances. [facility]  They will no longer be servicing patient  after this year and with certain medications. PLAN[facility]  Patient has enough Ozempic 0.27mto last until we can reapply in November 2022.  We will be ordering patient's Breztri via As[employer]ient assistance program.  Hypertension: Uncontrolled- BP goal <130/80; current treatment: bisoprolol, clonidine,  hydralazine, isosorbide Current home readings:  traditional automatic cuff (reports labile from 100s-170s SBP) wrist cuff: 140/82, 123/71; PCP in office today 123/71 Reports hypotensive/hypertensive symptoms Counseled on taking all BP medications as prescribed and at the same time of day to keep steady control; BP is managed by cardiology, therefore patient instructed to call cards for any BP needs; patient to keep strict log of BP times and readings   Patient Goals/Self-Care Activities Over the next 90 days, patient will:  - take medications as prescribed check glucose DAILY (FASTING) OR IF SYMPTOMATIC , document, and provide at future appointments check blood pressure DAILY OR IF SYMPTOMATIC, document, and provide at future appointments  Follow Up Plan: Telephone follow up appointment with care management team member scheduled for: NEXT WEEK      Medication Assistance: Assessed patient finances. Patient has been receiving medications via [facility].  They will no longer be servicing patient after this year and with certain medications. PLAN: health dept to order refills for [facility].  Patient has enough Ozempic 0.52m to last until we can reapply in November 2022.  We will be ordering patient's Breztri via AFirefighterpatient assistance program.  Patient's [facility] pharmacy [facility]:  [address] [address]Phone: [phone]: [phone] [facility] [address] [address]Phone: [phone]: [phone] Follow Up:  Patient agrees to Care Plan and Follow-up.  Plan: Telephone follow up appointment with care management team member scheduled for:  NEXT WEEK  JRegina Eck PharmD, BCPS Clinical Pharmacist, [place] Phone [phone]

## 2020-08-21 ENCOUNTER — Ambulatory Visit: Payer: PPO | Admitting: Family Medicine

## 2020-08-21 DIAGNOSIS — E119 Type 2 diabetes mellitus without complications: Secondary | ICD-10-CM | POA: Diagnosis not present

## 2020-08-21 DIAGNOSIS — H40033 Anatomical narrow angle, bilateral: Secondary | ICD-10-CM | POA: Diagnosis not present

## 2020-08-23 NOTE — Patient Instructions (Addendum)
Visit Information  PATIENT GOALS:  Goals Addressed               This Visit's Progress     Patient Stated     T2DM (pt-stated)        Current Barriers:  Unable to independently afford treatment regimen Unable to maintain control of T2DM  Pharmacist Clinical Goal(s):  Over the next 90 days, patient will verbalize ability to afford treatment regimen maintain control of T2DM as evidenced by IMPROVED GLYCEMIC CONTROL  through collaboration with PharmD and provider.    Interventions: 1:1 collaboration with [name], [name], MD regarding development and update of comprehensive plan of care as evidenced by provider attestation and co-signature Inter-disciplinary care team collaboration (see longitudinal plan of care) Comprehensive medication review performed; medication list updated in electronic medical record  Diabetes: Uncontrolled-A1C 7.9%; current treatment: OZEMPIC 0.25MG SQ WEEKLY, FARXIGA 5MG DAILY;  Patient to restart Ozempic 0.58m sq weekly--has not been taking, now BG elevated  Denies personal and family history of Medullary thyroid cancer (MTC) Current glucose readings: fasting glucose: up to 200s, post prandial glucose: N/A Denies hypoglycemic/hyperglycemic symptoms Discussed meal planning options and Plate method for healthy eating Avoid sugary drinks and desserts Incorporate balanced protein, non starchy veggies, 1 serving of carbohydrate with each meal Increase water intake Increase physical activity as able Current exercise: UNABLE DUE TO CURRENT STATE--BLOOD PRESSURE LABILE; ENERGY LEVEL DECREASED Educated on medications-purpose & side effects Assessed patient finances. [facility]  They will no longer be servicing patient after this year and with certain medications. PLAN[facility]  Patient has enough Ozempic 0.290mto last until we can reapply in November 2022.  We  will be ordering patient's Breztri via AsTime Warneratient assistance program.  Hypertension: Uncontrolled- BP goal <130/80; current treatment: bisoprolol, clonidine, hydralazine, isosorbide Current home readings:  traditional automatic cuff (reports labile from 100s-170s SBP) wrist cuff: 140/82, 123/71; PCP in office today 123/71 Reports hypotensive/hypertensive symptoms Counseled on taking all BP medications as prescribed and at the same time of day to keep steady control; BP is managed by cardiology, therefore patient instructed to call cards for any BP needs; patient to keep strict log of BP times and readings   Patient Goals/Self-Care Activities Over the next 90 days, patient will:  - take medications as prescribed check glucose DAILY (FASTING) OR IF SYMPTOMATIC , document, and provide at future appointments check blood pressure DAILY OR IF SYMPTOMATIC, document, and provide at future appointments  Follow Up Plan: Telephone follow up appointment with care management team member scheduled for: NEXT WEEK         The patient verbalized understanding of instructions, educational materials, and care plan provided today and declined offer to receive copy of patient instructions, educational materials, and care plan.   Telephone follow up appointment with care management team member scheduled for: NEXT WEEK  Signature JuRegina EckPharmD, BCPS Clinical Pharmacist, [place] Phone [phone]

## 2020-08-25 ENCOUNTER — Ambulatory Visit: Payer: PPO | Admitting: *Deleted

## 2020-08-25 ENCOUNTER — Other Ambulatory Visit: Payer: Self-pay

## 2020-08-25 VITALS — BP 87/53 | HR 78

## 2020-08-25 DIAGNOSIS — I5042 Chronic combined systolic (congestive) and diastolic (congestive) heart failure: Secondary | ICD-10-CM | POA: Diagnosis not present

## 2020-08-25 DIAGNOSIS — F339 Major depressive disorder, recurrent, unspecified: Secondary | ICD-10-CM

## 2020-08-25 DIAGNOSIS — E1142 Type 2 diabetes mellitus with diabetic polyneuropathy: Secondary | ICD-10-CM | POA: Diagnosis not present

## 2020-08-25 DIAGNOSIS — J449 Chronic obstructive pulmonary disease, unspecified: Secondary | ICD-10-CM

## 2020-08-25 DIAGNOSIS — J410 Simple chronic bronchitis: Secondary | ICD-10-CM | POA: Diagnosis not present

## 2020-08-25 DIAGNOSIS — I10 Essential (primary) hypertension: Secondary | ICD-10-CM | POA: Diagnosis not present

## 2020-08-25 DIAGNOSIS — I2510 Atherosclerotic heart disease of native coronary artery without angina pectoris: Secondary | ICD-10-CM | POA: Diagnosis not present

## 2020-08-25 NOTE — Patient Instructions (Addendum)
Visit Information  PATIENT GOALS:  Goals Addressed               This Visit's Progress     Patient Stated     COMPLETED: "I want to manage my heart failure" (pt-stated)        Current Barriers:  Chronic Disease Management support and education needs related to chronic diastolic congestive heart failure  Nurse Case Manager Clinical Goal(s):  Over the next 30 days, the patient will demonstrate ongoing self health care management ability as evidenced by no CHF exacerbations and no increase in weight above 270 as a result of fluid retention. * Over the next 90 days, patient will follow-up with cardiologist as directed  Interventions:  Evaluation of current treatment plan related to CHF and patient's adherence to plan as established by provider. Previously advised patient to continue following a strict low sodium diet Chart reviewed Discussed plans with patient for ongoing care management follow up and provided patient with direct contact information for care management team Advised patient, providing education and rationale, to weigh daily and record, calling  RNCM at [phone] or cardiologist at [phone] for weight gain of 3lbs overnight or 5 pounds in a week.  Reviewed medications and discussed Lasix and spironolactone Patient self-discontinued lasix 1 to 2 weeks ago when he developed "kidney pain". He also didn't think it was making a difference in blood pressure or fluid retention. He hasn't noticed any fluid retention. Did not realize spironolactone is a diuretic.  Staff message sent to pharmacist at cardiology office, Tommy Medal to notify her that of the lasix discontinuation. He has seen Mr Jost recently for blood pressure management.  Discussed morning weights. Denies any significant fluctuations.  Reviewed oxygen use. Currently using portable O2 concentrator on 3L pulse during the day. He enjoys the freedom to move around with the portable unit. Patient is using  2L continuous O2 with floor model concentrator. Reports that oxygen amount is sufficient for normal activities.   Patient Self Care Activities:  Performs ADL's independently Performs IADL's independently  Please see past updates related to this goal by clicking on the "Past Updates" button in the selected goal        COMPLETED: "I would like to keep my blood sugar under control" (pt-stated)         Current Barriers:  Knowledge deficits related to DM management Financial constraints CKD  Nurse Case Manager Clinical Goal(s):  Over the next 90 days, patient will continue to work with [facility] clinical pharmacist regarding diabetes management Over the next 60 days, patient will talk with the RN Care Manager regarding diabetes management  Interventions:  Chart reviewed, including recent office notes, PharmD notes, and lab reports Talked with patient by telephone Reviewed and discussed medications: Farxiga 30m daily Discussed home blood sugar readings Checking blood sugar twice daily No lows Blood sugar today 162 after drinking a slim fast Discussed diet and activity level Patient has lost 30 lbs by changing diet  Encouraged patient to continue checking blood sugar as directed and to report any readings outside of recommended range Previously provided with RN Care Manager contact information and encouraged to reach out as needed  Patient Self Care Activities:  Performs ADL's independently Performs IADL's independently  Please see past updates related to this goal by clicking on the "Past Updates" button in the selected goal        Other     COMPLETED: Have Medical Evaluation for Irregular Heart Rate  Timeframe:  Short-Term Goal Priority:  High Start Date:  06/05/20                           Expected End Date:   06/11/20                    Follow-up: No follow up needed  Keep all medical appointments Call cardiologist or seek appropriate medical attention for any new or  worsening symptoms Call RN Care Manager as needed [phone]      Manage Pulmonary Conditions   On track     Timeframe:  Long-Range Goal Priority:  Medium Start Date:    06/05/20                         Expected End Date:  06/05/21                      Follow-up: 08/14/20  Use CPAP each night Follow-up with pulmonologist in July 2022 as directed Continue to monitor pulse ox daily and PRN and to call PCP or pulmonologist with any readings outside of recommended range. Keep all medical appointments Seek appropriate medical attention for any new or worsening symptoms Take medications as prescribed Call [facility] as needed [phone] Keep appt with RNCM on 08/14/20      Track and Manage My Blood Pressure-Hypertension   Not on track     Timeframe:  Long-Range Goal Priority:  Medium Start Date:   05/14/20                          Expected End Date:   05/14/21                      Follow Up Date 08/26/20   check blood pressure several times today write blood pressure results in a log or diary Keep all medical appointments Take medications as directed Call cardiologist with any lower readings or with any new or worsening symptoms Seek appropriate medical attention for any new or worsening symptoms Call [facility] with any nursing needs [phone] Hold hydralazine for Systolic blood pressure (top number) under 182 or diastolic blood pressure (bottom number) under 60 mm Hg per Dr Sallyanne Kuster   Why is this important?   You won't feel high blood pressure, but it can still hurt your blood vessels.  High blood pressure can cause heart or kidney problems. It can also cause a stroke.  Making lifestyle changes like losing a little weight or eating less salt will help.  Checking your blood pressure at home and at different times of the day can help to control blood pressure.  If the doctor prescribes medicine remember to take it the way the doctor ordered.  Call the office if you cannot afford the  medicine or if there are questions about it.     Notes:           Patient verbalizes understanding of instructions provided today and agrees to view in [place].   Plan:Telephone follow up appointment with care management team member scheduled for:  08/26/20 with RNCM for acute follow-up on BP and 09/25/20 for chronic follow-up. and The patient has been provided with contact information for the care management team and has been advised to call with any health related questions or concerns.    [name], BSN, RN-BC  [place] / [facility] Care Management Direct Dial: [phone]

## 2020-08-25 NOTE — Chronic Care Management (AMB) (Addendum)
Chronic Care Management   CCM RN Visit Note  08/25/2020 Name: Kenneth Fuller MRN: [id number] DOB: 12/11/1945  Subjective: Kenneth Fuller is a 75 y.o. year old male who is a primary care patient of [name], [name], MD. The care management team was consulted for assistance with disease management and care coordination needs.    Engaged with patient face to face for follow up visit in response to provider referral for case management and/or care coordination services.   Consent to Services:  The patient was given information about Chronic Care Management services, agreed to services, and gave verbal consent prior to initiation of services.  Please see initial visit note for detailed documentation.   Patient agreed to services and verbal consent obtained.   Assessment: Review of patient past medical history, allergies, medications, health status, including review of consultants reports, laboratory and other test data, was performed as part of comprehensive evaluation and provision of chronic care management services.   SDOH (Social Determinants of Health) assessments and interventions performed:    CCM Care Plan  Allergies  Allergen Reactions   Amlodipine Besy-Benazepril Hcl Swelling and Other (See Comments)    Makes tongue swell (lotrel)   Oxycodone Itching   Phenergan [Promethazine Hcl] Other (See Comments)    "I can't remember."   Hydrocodone Itching    Can tolerate in low doses    Outpatient Encounter Medications as of 08/25/2020  Medication Sig Note   albuterol (PROVENTIL) (2.5 MG/3ML) 0.083% nebulizer solution Take 3 mLs (2.5 mg total) by nebulization every 6 (six) hours as needed for wheezing or shortness of breath.    albuterol (VENTOLIN HFA) 108 (90 Base) MCG/ACT inhaler Inhale 2 puffs into the lungs every 6 (six) hours as needed for wheezing or shortness of breath.    atorvastatin (LIPITOR) 40 MG tablet Take 40 mg by mouth daily. 1 Tablet Daily    bisoprolol  (ZEBETA) 5 MG tablet Take 1/2 (one-half) tablet by mouth once daily (Patient taking differently: Take 2.5 mg by mouth daily.)    Budeson-Glycopyrrol-Formoterol (BREZTRI AEROSPHERE) 160-9-4.8 MCG/ACT AERO Inhale 2 puffs into the lungs in the morning and at bedtime.    Cholecalciferol (VITAMIN D3) 125 MCG (5000 UT) TABS Take 5,000 Units by mouth daily.    cloNIDine (CATAPRES) 0.2 MG tablet Take 1 tablet (0.2 mg total) by mouth 3 (three) times daily.    colchicine 0.6 MG tablet Take 0.6 mg by mouth daily. As needed    dapagliflozin propanediol (FARXIGA) 5 MG TABS tablet Take 1 tablet (5 mg total) by mouth daily.    famotidine (PEPCID) 20 MG tablet Take 1 tablet (20 mg total) by mouth at bedtime.    FEROSUL 325 (65 Fe) MG tablet Take 325 mg by mouth 3 (three) times daily.    furosemide (LASIX) 40 MG tablet Take 40 mg by mouth daily as needed (fluid retention.).    hydrALAZINE (APRESOLINE) 25 MG tablet Take 3 tablets (75 mg total) by mouth 3 (three) times daily.    isosorbide dinitrate (ISORDIL) 20 MG tablet Take 1 tablet (20 mg total) by mouth 3 (three) times daily.    MYRBETRIQ 25 MG TB24 tablet Take 1 tablet by mouth once daily    nitroGLYCERIN (NITROSTAT) 0.4 MG SL tablet Place 1 tablet (0.4 mg total) under the tongue every 5 (five) minutes x 3 doses as needed for chest pain. 07/15/2020: On hand   omeprazole (PRILOSEC) 40 MG capsule Take 1 capsule (40 mg total) by mouth 2 (  two) times daily at 8 am and 10 pm. Take 30-60 minutes prior to breakfast.    ONETOUCH ULTRA test strip USE 1 STRIP TO CHECK GLUCOSE ONCE DAILY    polyethylene glycol (MIRALAX / GLYCOLAX) 17 g packet Take 17 g by mouth as needed for mild constipation.    RESTASIS 0.05 % ophthalmic emulsion Place 1 drop into both eyes in the morning and at bedtime.    Semaglutide,0.25 or 0.5MG/DOS, (OZEMPIC, 0.25 OR 0.5 MG/DOSE,) 2 MG/1.5ML SOPN Inject 0.25 mg into the skin once a week.    sertraline (ZOLOFT) 100 MG tablet Take 1 tablet (100 mg  total) by mouth daily.    tamsulosin (FLOMAX) 0.4 MG CAPS capsule Take 1 capsule (0.4 mg total) by mouth daily after supper.    No facility-administered encounter medications on file as of 08/25/2020.    Patient Active Problem List   Diagnosis Date Noted   Asthma exacerbation 07/09/2020   Chest pain 06/06/2020   Right upper lobe pneumonia 05/07/2020   Irritable larynx 05/07/2020   Hyperkalemia 06/05/2019   Dizziness 06/04/2019   Near syncope 06/04/2019   Insomnia    CHF (congestive heart failure) ([place]) 01/06/2018   Depression with anxiety 01/04/2018   CKD (chronic kidney disease), stage III ([place]) 01/04/2018   Chronic obstructive pulmonary disease ([place]) 01/04/2018   Depression, recurrent ([place]) 11/11/2017   Chronic diastolic heart failure ([place]) 04/13/2017   Swelling of lower extremity 11/30/2016   Obesity, Class III, BMI 40-49.9 (morbid obesity) ([place]) 08/21/2015   Morbid obesity ([place]) 08/21/2015   Coronary artery disease involving native coronary artery of native heart without angina pectoris 12/05/2014   Urinary urgency 10/15/2014   Spinal stenosis, lumbar region, with neurogenic claudication 01/02/2014   Hyperlipidemia associated with type 2 diabetes mellitus ([place]) 10/25/2013   Hyperlipidemia, unspecified 10/25/2013   COLONIC POLYPS, ADENOMATOUS 02/25/2007   Diabetes mellitus type II, non insulin dependent ([place]) 02/25/2007   Gout 02/25/2007   Essential hypertension 02/25/2007   Cough variant asthma 02/25/2007   OSA on CPAP 02/25/2007   FATIGUE, CHRONIC 02/25/2007   HEADACHE, CHRONIC 02/25/2007   GERD (gastroesophageal reflux disease) 02/25/2007    Conditions to be addressed/monitored:HTN and Pulmonary Disease  Care Plan : RNCM: Respiratory Disorder  Updates made by Ilean China, RN since 08/25/2020 12:00 AM     Problem: Chronic Obstructive Asthma/COPD & OSA   Priority: Medium     Long-Range Goal: Manage Pulmonary Conditions   Start Date: 06/05/2020  This Visit's  Progress: On track  Recent Progress: Not on track  Priority: Medium  Note:   Current Barriers:  Chronic Disease Management support and education needs related to chronic obstructive asthma/COPD and obstructive sleep apnea  Nurse Case Manager Clinical Goal(s):  patient will work with pulmonologist to address needs related to medical management of pulmonary conditions patient will meet with RN Care Manager to address self-management of pulmonary conditions Patient will continue to check and record O2 levels and will seek appropriate medical attention for readings outside of recommended range Patient will demonstrate ongoing self health management by wearing bipap nightly as instructed  Interventions:  1:1 collaboration with Claretta Fraise, MD regarding development and update of comprehensive plan of care as evidenced by provider attestation and co-signature Inter-disciplinary care team collaboration (see longitudinal plan of care) Evaluation of current treatment plan related to pulmonary conditions and patient's adherence to plan as established by provider. Chart reviewed including relevant office notes and lab results Reviewed recent visit with pulmonologist Reviewed and  discussed medications Compliant with medications Encouraged to continue monitoring oxygen with pulse ox daily and PRN Encouraged to continue wearing bipap nightly for at least 4-6 hrs Discussed inhalers and prescription assistance application. Patient will continue working with PharmD regarding this. Discussed plans with patient for ongoing care management follow up and provided patient with direct contact information for care management team Provided with RNCM contact number and encouraged to reach out as needed  Self Care Activities:  Self administers medications as prescribed Performs ADL's independently Performs IADL's independently Calls provider office for new concerns or questions  Patient Goals Over the next  90 days, patient will: Use bipap each night for at least 4-6 hrs Continue to monitor pulse ox daily and PRN and to call PCP or pulmonologist with any readings outside of recommended range. Keep all medical appointments Seek appropriate medical attention for any new or worsening symptoms Take medications as prescribed Work with PharmD regarding prescription assistance Call [facility] as needed [phone]  Follow Up Plan:  In person office visit scheduled with RNCM for 09/25/20 The patient has been provided with contact information for the care management team and has been advised to call with any health related questions or concerns.     Care Plan : RNCM: Hypertension (Adult)  Updates made by Ilean China, RN since 08/25/2020 12:00 AM     Problem: Hypertension (Hypertension)   Priority: Medium     Long-Range Goal: Hypertension Managed   Start Date: 05/14/2020  This Visit's Progress: Not on track  Recent Progress: On track  Priority: Medium  Note:   Objective:  BP Readings from Last 3 Encounters:  08/25/20 (!) 87/53  08/18/20 126/76  08/13/20 138/90   Current Barriers:  Knowledge Deficits related to uncontrolled hypertension Chronic Disease Management support and education needs related to hypertension Blood pressure variations between home readings and office readings Labile blood pressures  Case Manager Clinical Goal(s):  patient will demonstrate improved adherence to prescribed treatment plan for hypertension as evidenced by taking all medications as prescribed, monitoring and recording blood pressure as directed, adhering to low sodium/DASH diet patient will demonstrate improved health management independence as evidenced by checking blood pressure as directed and notifying PCP if SBP>160 or DBP > 90, taking all medications as prescribe, and adhering to a low sodium diet as discussed. Patient will work with cardiologist regarding medical management of hypertension Patient  will work with RN Care Manager regarding self management of hypertension  Interventions:  Collaboration with [name], [name], MD regarding development and update of comprehensive plan of care as evidenced by provider attestation and co-signature Inter-disciplinary care team collaboration (see longitudinal plan of care) Evaluation of current treatment plan related to hypertension self management and patient's adherence to plan as established by provider. Reviewed medications with patient and verified compliance Advised patient, providing education and rationale, to monitor blood pressure daily and record, calling PCP for findings outside established parameters.  Discussed recent home blood pressure readings 106/68 this morning. Takes meds around 7:00 am. Collaborated with cardiologist, Dr Sallyanne Kuster, regarding symptomatic hypotension (feels a little more tired than usual) Per Dr Sallyanne Kuster patient should hold hydralazine for any SBP under 110 or DBP under 60 mm Hg. Patient notified by telephone.  Patient advised to move carefully and change positions slowly Recommended increasing water intake today Recommended to check and record blood pressure several times today and to call PCP/cardiologist with any lower readings or new symptoms Encouraged patient to reach out to cardiologist with any new  or worsening symptoms or to seek emergency medical attention as necessary Encouraged patient to reach out to [facility] as needed Discussed plans with patient for ongoing care management follow up and provided patient with direct contact information for care management team  Self-Care Activities: Self administers medications as prescribed Attends all scheduled provider appointments Calls provider office for new concerns, questions, or BP outside discussed parameters Checks BP and records as discussed  Patient Goals: check blood pressure several times today write blood pressure results in a log or diary Keep  all medical appointments Take medications as directed Call cardiologist with any lower readings or with any new or worsening symptoms Seek appropriate medical attention for any new or worsening symptoms Call [facility] with any nursing needs [phone]  Follow Up Plan:  Telephone follow-up scheduled with RNCM for acute hypotension on 08/26/20 and chronic follow-up on 09/25/20 The patient has been provided with contact information for the care management team and has been advised to call with any health related questions or concerns.       Care Plan : RNCM: Bradycardia  Updates made by Ilean China, RN since 08/25/2020 12:00 AM  Completed 08/25/2020   Problem: Bradycardia and Irregular Heart Rate Resolved 08/25/2020  Priority: High     Goal: Have Medical Evaluation for Bradycardia and Irregular Heart Rate Completed 08/25/2020  Start Date: 06/05/2020  This Visit's Progress: On track  Recent Progress: On track  Priority: High  Note:   Objective: Pulse Readings from Last 3 Encounters:  08/25/20 78  08/18/20 68  08/13/20 74   Heart Monitor Interpretation from 07/01/20 by Dr Sallyanne Kuster "Nothing serious or unexpected on monitor. Heart rate response to activity is alittle blunted, but this is due to the medications he is on."  Current Barriers:  Knowledge Deficits related to slow and irregular heart rate Chronic Disease Management support and education needs related to bradycardia  Nurse Case Manager Clinical Goal(s):  patient will work with PCP office to address needs related to slow and irregular heart rate Patient will seek appropriate medical attention as necessary  Interventions:  GOAL MET 1:1 collaboration with Caryl Pina, MD regarding development and update of comprehensive plan of care as evidenced by provider attestation and co-signature Inter-disciplinary care team collaboration (see longitudinal plan of care) Chart reviewed including recent office notes,  hospital notes, cardiology notes, and lab results Reviewed heart monitor results and cardiology interpretation Encouraged patient to reach out to cardiologist or seek appropriate medical care for any new or worsening symptoms Encouraged patient to reach out to [facility] as needed  Self Care Activities:  Attends all scheduled provider appointments Performs ADL's independently Performs IADL's independently Calls provider office for new concerns or questions  Patient Goals Over the next 90 days, patient will: Keep all medical appointments Call cardiologist or seek appropriate medical attention for any new or worsening symptoms Call RN Care Manager as needed [phone]  Follow Up Plan:  No follow-up needed       Plan:Telephone follow up appointment with care management team member scheduled for:  08/26/20 with RNCM for acute follow-up on BP and 09/25/20 for chronic follow-up. and The patient has been provided with contact information for the care management team and has been advised to call with any health related questions or concerns.    [name], BSN, RN-BC Embedded Chronic Care Manager [facility] / [place] Management Direct Dial: [phone]

## 2020-08-26 ENCOUNTER — Ambulatory Visit: Payer: PPO | Admitting: *Deleted

## 2020-08-26 DIAGNOSIS — I10 Essential (primary) hypertension: Secondary | ICD-10-CM

## 2020-08-26 DIAGNOSIS — I5042 Chronic combined systolic (congestive) and diastolic (congestive) heart failure: Secondary | ICD-10-CM

## 2020-08-27 DIAGNOSIS — I5032 Chronic diastolic (congestive) heart failure: Secondary | ICD-10-CM | POA: Diagnosis not present

## 2020-08-27 DIAGNOSIS — R0902 Hypoxemia: Secondary | ICD-10-CM | POA: Diagnosis not present

## 2020-08-27 DIAGNOSIS — G4733 Obstructive sleep apnea (adult) (pediatric): Secondary | ICD-10-CM | POA: Diagnosis not present

## 2020-08-29 ENCOUNTER — Ambulatory Visit: Payer: Self-pay | Admitting: Pharmacist

## 2020-08-29 DIAGNOSIS — I129 Hypertensive chronic kidney disease with stage 1 through stage 4 chronic kidney disease, or unspecified chronic kidney disease: Secondary | ICD-10-CM | POA: Diagnosis not present

## 2020-08-29 DIAGNOSIS — N189 Chronic kidney disease, unspecified: Secondary | ICD-10-CM | POA: Diagnosis not present

## 2020-08-29 DIAGNOSIS — J410 Simple chronic bronchitis: Secondary | ICD-10-CM

## 2020-08-29 DIAGNOSIS — I5042 Chronic combined systolic (congestive) and diastolic (congestive) heart failure: Secondary | ICD-10-CM | POA: Diagnosis not present

## 2020-08-29 DIAGNOSIS — E1129 Type 2 diabetes mellitus with other diabetic kidney complication: Secondary | ICD-10-CM | POA: Diagnosis not present

## 2020-08-29 DIAGNOSIS — R809 Proteinuria, unspecified: Secondary | ICD-10-CM | POA: Diagnosis not present

## 2020-08-29 DIAGNOSIS — E1122 Type 2 diabetes mellitus with diabetic chronic kidney disease: Secondary | ICD-10-CM | POA: Diagnosis not present

## 2020-08-29 DIAGNOSIS — R0602 Shortness of breath: Secondary | ICD-10-CM

## 2020-08-29 MED ORDER — BREZTRI AEROSPHERE 160-9-4.8 MCG/ACT IN AERO: 2.0000 | INHALATION_SPRAY | Freq: Two times a day (BID) | RESPIRATORY_TRACT | 4 refills | Status: DC

## 2020-08-29 NOTE — Progress Notes (Signed)
Chronic Care Management Pharmacy Note  08/29/2020 Name:  Kenneth Fuller MRN:  [id number] DOB:  07-Oct-1945  Summary: inhaler f/u; med assistance  Recommendations/Changes made from today's visit: Assessed patient finances. Patient has been receiving medications via [facility] Department.  They will no longer be servicing patient after this year and with certain medications.  We will be ordering patient's Breztri via [employer] patient assistance program--call placed to az&me to add breztri inhaler to patient's medications, escribed breztri to [facility] order pharmacy (pharmacy for az&me patient assistance).  Plan: f/u next month  Subjective: Kenneth Fuller is an 75 y.o. year old male who is a primary patient of [name], [name], MD.  The CCM team was consulted for assistance with disease management and care coordination needs.    Collaboration with az&me patient assistance  for follow up visit in response to provider referral for pharmacy case management and/or care coordination services.   Consent to Services:  The patient was given information about Chronic Care Management services, agreed to services, and gave verbal consent prior to initiation of services.  Please see initial visit note for detailed documentation.   Patient Care Team: [name], [name], MD as PCP - General (Family Medicine) Croitoru, Dani Gobble, MD as PCP - Cardiology (Cardiology) Harlen Labs, MD as Referring Physician (Optometry) Shea Evans, [name], LCSW as Social Worker (Licensed Clinical Social Worker) Ilean China, RN as Case Public librarian, Dani Gobble, MD as Financial risk analyst Physician (Cardiology) [name], [facility] (Pharmacist) Liana Gerold, MD as Consulting Physician (Nephrology) Milus Banister, MD as Attending Physician (Gastroenterology) Rigoberto Noel, MD as Consulting Physician (Pulmonary Disease)   Objective:  Lab Results  Component Value Date   CREATININE 1.25  07/14/2020   CREATININE 1.24 07/10/2020   CREATININE 1.14 07/09/2020    Lab Results  Component Value Date   HGBA1C 7.9 (H) 07/09/2020   Last diabetic Eye exam:  Lab Results  Component Value Date/Time   HMDIABEYEEXA No Retinopathy 03/23/2018 12:00 AM    Last diabetic Foot exam: No results found for: HMDIABFOOTEX      Component Value Date/Time   CHOL 90 (L) 10/04/2019 1349   CHOL 111 06/12/2012 1232   TRIG 142 10/04/2019 1349   TRIG 141 01/24/2014 1143   TRIG 192 (H) 06/12/2012 1232   HDL 40 10/04/2019 1349   HDL 38 (L) 01/24/2014 1143   HDL 33 (L) 06/12/2012 1232   CHOLHDL 2.3 10/04/2019 1349   CHOLHDL 4.8 11/22/2006 1712   VLDL 39 11/22/2006 1712   [place] 26 10/04/2019 1349   [place] 27 10/25/2013 1224   [place] 40 06/12/2012 1232    Hepatic Function Latest Ref Rng & Units 07/14/2020 07/10/2020 06/06/2020  Total Protein 6.0 - 8.5 g/dL 6.8 7.6 6.6  Albumin 3.7 - 4.7 g/dL 4.6 4.2 3.9  AST 0 - 40 IU/L _0 ALT 0 - 44 IU/L 33 28 24  Alk Phosphatase 44 - 121 IU/L 98 89 73  Total Bilirubin 0.0 - 1.2 mg/dL 0.5 0.7 0.9  Bilirubin, Direct 0.00 - 0.40 mg/dL - - -    Lab Results  Component Value Date/Time   TSH 3.30 02/25/2017 10:28 AM   TSH 2.300 08/21/2015 01:52 PM    CBC Latest Ref Rng & Units 07/14/2020 07/10/2020 07/09/2020  WBC 3.4 - 10.8 x10E3/uL 16.7(H) 12.4(H) 8.8  Hemoglobin 13.0 - 17.7 g/dL 16.3 16.4 15.1  Hematocrit 37.5 - 51.0 % 48.5 51.2 46.9  Platelets 150 - 450 x10E3/uL 250 211 185    No results found for: VD25OH  Clinical ASCVD: No  The ASCVD Risk score Mikey Bussing DC Jr., et al., 2013) failed to calculate for the following reasons:   The valid total cholesterol range is 130 to 320 mg/dL    Other: (CHADS2VASc if Afib, PHQ9 if depression, MMRC or CAT for COPD, ACT, DEXA)  Social History   Tobacco Use  Smoking Status Former   Packs/day: 1.00   Types: Cigarettes   Start date: 02/09/1964   Quit date: 02/08/1969   Years since quitting: 51.5  Smokeless  Tobacco Former   Quit date: 1971   BP Readings from Last 3 Encounters:  08/25/20 (!) 87/53  08/18/20 126/76  08/13/20 138/90   Pulse Readings from Last 3 Encounters:  08/25/20 78  08/18/20 68  08/13/20 74   Wt Readings from Last 3 Encounters:  08/18/20 269 lb (122 kg)  08/13/20 266 lb 6.4 oz (120.8 kg)  08/07/20 268 lb (121.6 kg)    Assessment: Review of patient past medical history, allergies, medications, health status, including review of consultants reports, laboratory and other test data, was performed as part of comprehensive evaluation and provision of chronic care management services.   SDOH:  (Social Determinants of Health) assessments and interventions performed:    CCM Care Plan  Allergies  Allergen Reactions   Amlodipine Besy-Benazepril Hcl Swelling and Other (See Comments)    Makes tongue swell (lotrel)   Oxycodone Itching   Phenergan [Promethazine Hcl] Other (See Comments)    "I can't remember."   Hydrocodone Itching    Can tolerate in low doses    Medications Reviewed Today     Reviewed by Ilean China, RN (Registered Nurse) on 08/25/20 at [place] List Status: <None>   Medication Order Taking? Sig Documenting Provider Last Dose Status Informant  albuterol (PROVENTIL) (2.5 MG/3ML) 0.083% nebulizer solution [id number] No Take 3 mLs (2.5 mg total) by nebulization every 6 (six) hours as needed for wheezing or shortness of breath. [name], [name], MD Taking Active   albuterol (VENTOLIN HFA) 108 (90 Base) MCG/ACT inhaler [id number] No Inhale 2 puffs into the lungs every 6 (six) hours as needed for wheezing or shortness of breath. Claretta Fraise, MD Taking Active Self  atorvastatin (LIPITOR) 40 MG tablet [id number] No Take 40 mg by mouth daily. 1 Tablet Daily [provider] Taking Active Self  bisoprolol (ZEBETA) 5 MG tablet [id number] No Take 1/2 (one-half) tablet by mouth once daily  Patient taking differently: Take 2.5 mg by mouth daily.    [name], [name], MD Taking Active Self  Budeson-Glycopyrrol-Formoterol (BREZTRI AEROSPHERE) 160-9-4.8 MCG/ACT AERO [id number] No Inhale 2 puffs into the lungs in the morning and at bedtime. [name], [name], MD Taking Active   Cholecalciferol (VITAMIN D3) 125 MCG (5000 UT) TABS [id number] No Take 5,000 Units by mouth daily. [provider] Taking Active Self  cloNIDine (CATAPRES) 0.2 MG tablet [id number] No Take 1 tablet (0.2 mg total) by mouth 3 (three) times daily. [name], [name], MD Taking Active   colchicine 0.6 MG tablet [id number] No Take 0.6 mg by mouth daily. As needed [provider] Taking Active Self  dapagliflozin propanediol (FARXIGA) 5 MG TABS tablet [id number] No Take 1 tablet (5 mg total) by mouth daily. [name], [name], MD Taking Active Self  famotidine (PEPCID) 20 MG tablet [id number] No Take 1 tablet (20 mg total) by mouth at bedtime. Martyn Ehrich, NP  Taking Active Self  FEROSUL 325 (65 Fe) MG tablet [id number] No Take 325 mg by mouth 3 (three) times daily. [provider] Taking Active   furosemide (LASIX) 40 MG tablet [id number] No Take 40 mg by mouth daily as needed (fluid retention.). [provider] Taking Active Self  hydrALAZINE (APRESOLINE) 25 MG tablet [id number] No Take 3 tablets (75 mg total) by mouth 3 (three) times daily. Croitoru, Mihai, MD Taking Active   isosorbide dinitrate (ISORDIL) 20 MG tablet [id number]  Take 1 tablet (20 mg total) by mouth 3 (three) times daily. [name], [name], MD  Active   MYRBETRIQ 25 MG TB24 tablet [id number]  Take 1 tablet by mouth once daily [name], [name], MD  Active   nitroGLYCERIN (NITROSTAT) 0.4 MG SL tablet [id number] No Place 1 tablet (0.4 mg total) under the tongue every 5 (five) minutes x 3 doses as needed for chest pain. Manuella Ghazi, Pratik D, DO Taking Expired 07/07/20 2359            Med Note (DAVIS, SOPHIA A   Tue Jul 15, 2020  8:58 AM) On hand  omeprazole (PRILOSEC) 40 MG capsule  [id number] No Take 1 capsule (40 mg total) by mouth 2 (two) times daily at 8 am and 10 pm. Take 30-60 minutes prior to breakfast. Ladene Artist, MD Taking Active Self  [facility] ULTRA test strip [id number] No USE 1 STRIP TO CHECK GLUCOSE ONCE DAILY [name], [name], MD Taking Active   polyethylene glycol (MIRALAX / GLYCOLAX) 17 g packet [id number] No Take 17 g by mouth as needed for mild constipation. [provider] Taking Active Self  RESTASIS 0.05 % ophthalmic emulsion [id number] No Place 1 drop into both eyes in the morning and at bedtime. [provider] Taking Active Self  Semaglutide,0.25 or 0.5MG/DOS, (OZEMPIC, 0.25 OR 0.5 MG/DOSE,) 2 MG/1.5ML SOPN [id number]  Inject 0.25 mg into the skin once a week. [provider]  Active   sertraline (ZOLOFT) 100 MG tablet [id number]  Take 1 tablet (100 mg total) by mouth daily. [name], [name], MD  Active   tamsulosin (FLOMAX) 0.4 MG CAPS capsule [id number] No Take 1 capsule (0.4 mg total) by mouth daily after supper. [name], [name], MD Taking Active Self           Med Note Tressie Ellis Dec 14, 2019  4:07 PM)              Patient Active Problem List   Diagnosis Date Noted   Asthma exacerbation 07/09/2020   Chest pain 06/06/2020   Right upper lobe pneumonia 05/07/2020   Irritable larynx 05/07/2020   Hyperkalemia 06/05/2019   Dizziness 06/04/2019   Near syncope 06/04/2019   Insomnia    CHF (congestive heart failure) ([place]) 01/06/2018   Depression with anxiety 01/04/2018   CKD (chronic kidney disease), stage III ([place]) 01/04/2018   Chronic obstructive pulmonary disease ([place]) 01/04/2018   Depression, recurrent ([place]) 11/11/2017   Chronic diastolic heart failure ([place]) 04/13/2017   Swelling of lower extremity 11/30/2016   Obesity, Class III, BMI 40-49.9 (morbid obesity) ([place]) 08/21/2015   Morbid obesity ([place]) 08/21/2015   Coronary artery disease involving native coronary artery of native heart  without angina pectoris 12/05/2014   Urinary urgency 10/15/2014   Spinal stenosis, lumbar region, with neurogenic claudication 01/02/2014   Hyperlipidemia associated with type 2 diabetes mellitus ([place]) 10/25/2013   Hyperlipidemia, unspecified 10/25/2013   COLONIC POLYPS, ADENOMATOUS 02/25/2007   Diabetes mellitus  type II, non insulin dependent ([place]) 02/25/2007   Gout 02/25/2007   Essential hypertension 02/25/2007   Cough variant asthma 02/25/2007   OSA on CPAP 02/25/2007   FATIGUE, CHRONIC 02/25/2007   HEADACHE, CHRONIC 02/25/2007   GERD (gastroesophageal reflux disease) 02/25/2007    Immunization History  Administered Date(s) Administered   Fluad Quad(high Dose 65+) 11/15/2018   Influenza Whole 10/09/2008   Influenza, High Dose Seasonal PF 11/29/2016, 11/08/2017   Influenza,inj,Quad PF,6+ Mos 12/04/2013, 12/04/2014, 12/23/2015   Pneumococcal Conjugate-13 12/04/2013   Pneumococcal Polysaccharide-23 08/26/2011   Td 11/08/2017    Conditions to be addressed/monitored: COPD  Care Plan : PHARMD MEDICATION MANAGMENT  Updates made by [name], [place] since 08/29/2020 12:00 AM     Problem: DISEASE PREVENTION PROGRESSION      Long-Range Goal: T2DM, HTN   Recent Progress: Not on track  Priority: High  Note:   Current Barriers:  Unable to independently afford treatment regimen Unable to maintain control of T2DM  Pharmacist Clinical Goal(s):  Over the next 90 days, patient will verbalize ability to afford treatment regimen maintain control of T2DM as evidenced by IMPROVED GLYCEMIC CONTROL  through collaboration with PharmD and provider.    Interventions: 1:1 collaboration with [name], [name], MD regarding development and update of comprehensive plan of care as evidenced by provider attestation and co-signature Inter-disciplinary care team collaboration (see longitudinal plan of care) Comprehensive medication review performed; medication list updated in electronic  medical record  Diabetes: Uncontrolled-A1C 7.9%; current treatment: OZEMPIC 0.25MG SQ WEEKLY, FARXIGA 5MG DAILY;  Patient to restart Ozempic 0.25m sq weekly--has not been taking, now BG elevated  Denies personal and family history of Medullary thyroid cancer (MTC) Current glucose readings: fasting glucose: up to 200s, post prandial glucose: N/A Denies hypoglycemic/hyperglycemic symptoms Discussed meal planning options and Plate method for healthy eating Avoid sugary drinks and desserts Incorporate balanced protein, non starchy veggies, 1 serving of carbohydrate with each meal Increase water intake Increase physical activity as able Current exercise: UNABLE DUE TO CURRENT STATE--BLOOD PRESSURE LABILE; ENERGY LEVEL DECREASED Educated on medications-purpose & side effects Assessed patient finances. [facility]  They will no longer be servicing patient after this year and with certain medications. PLAN[facility]  Patient has enough Ozempic 0.242mto last until we can reapply in November 2022.  We will be ordering patient's Breztri via [employer] assistance program--call placed to az&me to add breztri inhaler to patient's medications, [facility]pharmacy for az&me patient assistance).  Hypertension: Uncontrolled- BP goal <130/80; current treatment: bisoprolol, clonidine, hydralazine, isosorbide Current home readings:  traditional automatic cuff (reports labile from 100s-170s SBP) wrist cuff: 140/82, 123/71; PCP in office today 123/71 Reports hypotensive/hypertensive symptoms Counseled on taking all BP medications as prescribed and at the same time of day to keep steady control; BP is managed by cardiology, therefore patient instructed to call cards for any BP needs; patient to keep strict log of BP times and readings   Patient Goals/Self-Care  Activities Over the next 90 days, patient will:  - take medications as prescribed check glucose DAILY (FASTING) OR IF SYMPTOMATIC , document, and provide at future appointments check blood pressure DAILY OR IF SYMPTOMATIC, document, and provide at future appointments  Follow Up Plan: Telephone follow up appointment with care management team member scheduled for: 1 month      Medication Assistance: Assessed patient finances. Patient  has been receiving medications via [facility] Department.  They will no longer be servicing patient after this year and with certain medications. PLAN: health dept to order refills for [facility].  Patient has enough Ozempic 0.61m to last until we can reapply in November 2022.  We will be ordering patient's Breztri via [employer] assistance program.  [address] [address]Phone: [phone]: [phone] [facility] [address]Phone: [phone]: [phone]Phone: [phone]: [phone]  Follow Up:  Patient agrees to Care Plan and Follow-up.  Plan: Telephone follow up appointment with care management team member scheduled for:  1 month  JRegina Eck PharmD, BCPS Clinical Pharmacist, [place] Phone [phone]

## 2020-08-29 NOTE — Patient Instructions (Addendum)
Visit Information  PATIENT GOALS:  Goals Addressed               This Visit's Progress     Patient Stated     T2DM (pt-stated)        Current Barriers:  Unable to independently afford treatment regimen Unable to maintain control of T2DM  Pharmacist Clinical Goal(s):  Over the next 90 days, patient will verbalize ability to afford treatment regimen maintain control of T2DM as evidenced by IMPROVED GLYCEMIC CONTROL  through collaboration with PharmD and provider.    Interventions: 1:1 collaboration with [name], [name], MD regarding development and update of comprehensive plan of care as evidenced by provider attestation and co-signature Inter-disciplinary care team collaboration (see longitudinal plan of care) Comprehensive medication review performed; medication list updated in electronic medical record  Diabetes: Uncontrolled-A1C 7.9%; current treatment: OZEMPIC 0.25MG SQ WEEKLY, FARXIGA 5MG DAILY;  Patient to restart Ozempic 0.74m sq weekly--has not been taking, now BG elevated  Denies personal and family history of Medullary thyroid cancer (MTC) Current glucose readings: fasting glucose: up to 200s, post prandial glucose: N/A Denies hypoglycemic/hyperglycemic symptoms Discussed meal planning options and Plate method for healthy eating Avoid sugary drinks and desserts Incorporate balanced protein, non starchy veggies, 1 serving of carbohydrate with each meal Increase water intake Increase physical activity as able Current exercise: UNABLE DUE TO CURRENT STATE--BLOOD PRESSURE LABILE; ENERGY LEVEL DECREASED Educated on medications-purpose & side effects Assessed patient finances. [facility]  They will no longer be servicing patient after this year and with certain medications. PLAN[facility]  Patient has enough Ozempic 0.261mto last until we can reapply in November 2022.  We  will be ordering patient's Breztri via [employer] assistance program--call placed to az&me to add breztri inhaler to patient's medications, [facility] (pharmacy for az&me patient assistance).  Hypertension: Uncontrolled- BP goal <130/80; current treatment: bisoprolol, clonidine, hydralazine, isosorbide Current home readings:  traditional automatic cuff (reports labile from 100s-170s SBP) wrist cuff: 140/82, 123/71; PCP in office today 123/71 Reports hypotensive/hypertensive symptoms Counseled on taking all BP medications as prescribed and at the same time of day to keep steady control; BP is managed by cardiology, therefore patient instructed to call cards for any BP needs; patient to keep strict log of BP times and readings   Patient Goals/Self-Care Activities Over the next 90 days, patient will:  - take medications as prescribed check glucose DAILY (FASTING) OR IF SYMPTOMATIC , document, and provide at future appointments check blood pressure DAILY OR IF SYMPTOMATIC, document, and provide at future appointments  Follow Up Plan: Telephone follow up appointment with care management team member scheduled for: 1 month         The patient verbalized understanding of instructions, educational materials, and care plan provided today and declined offer to receive copy of patient instructions, educational materials, and care plan.   Telephone follow up appointment with care management team member scheduled for:  1 month  Signature JuRegina EckPharmD, BCPS Clinical Pharmacist, [place] Phone [phone]

## 2020-08-31 ENCOUNTER — Other Ambulatory Visit: Payer: Self-pay | Admitting: Family Medicine

## 2020-08-31 ENCOUNTER — Other Ambulatory Visit: Payer: Self-pay | Admitting: Gastroenterology

## 2020-09-01 ENCOUNTER — Encounter: Payer: Self-pay | Admitting: *Deleted

## 2020-09-01 NOTE — Patient Instructions (Signed)
Visit Information  PATIENT GOALS:  Goals Addressed             This Visit's Progress    Track and Manage My Blood Pressure-Hypertension   Not on track    Timeframe:  Long-Range Goal Priority:  Medium Start Date:   05/14/20                          Expected End Date:   05/14/21                      Follow Up Date 09/25/20   check blood pressure several times today write blood pressure results in a log or diary Keep all medical appointments Take medications as directed Call cardiologist with any lower readings or with any new or worsening symptoms Seek appropriate medical attention for any new or worsening symptoms Call [place] with any nursing needs [phone] Hold hydralazine for Systolic blood pressure (top number) under 618 or diastolic blood pressure (bottom number) under 60 mm Hg per Dr Sallyanne Kuster   Why is this important?   You won't feel high blood pressure, but it can still hurt your blood vessels.  High blood pressure can cause heart or kidney problems. It can also cause a stroke.  Making lifestyle changes like losing a little weight or eating less salt will help.  Checking your blood pressure at home and at different times of the day can help to control blood pressure.  If the doctor prescribes medicine remember to take it the way the doctor ordered.  Call the office if you cannot afford the medicine or if there are questions about it.     Notes:         Patient verbalizes understanding of instructions provided today and agrees to view in [place].   Telephone follow up appointment with care management team member scheduled for: 09/25/20 with RNCM  [name], BSN, RN-BC [place] / [place] Management Direct Dial: [phone]

## 2020-09-01 NOTE — Chronic Care Management (AMB) (Signed)
Chronic Care Management   CCM RN Visit Note  08/26/2020 Name: Kenneth Fuller MRN: [id number] DOB: 26-Feb-1945  Subjective: Kenneth Fuller is a 75 y.o. year old male who is a primary care patient of [name], [name], MD. The care management team was consulted for assistance with disease management and care coordination needs.    Engaged with patient by telephone for follow up visit in response to provider referral for case management and/or care coordination services.   Consent to Services:  The patient was given information about Chronic Care Management services, agreed to services, and gave verbal consent prior to initiation of services.  Please see initial visit note for detailed documentation.   Patient agreed to services and verbal consent obtained.   Assessment: Review of patient past medical history, allergies, medications, health status, including review of consultants reports, laboratory and other test data, was performed as part of comprehensive evaluation and provision of chronic care management services.   SDOH (Social Determinants of Health) assessments and interventions performed:    CCM Care Plan  Allergies  Allergen Reactions   Amlodipine Besy-Benazepril Hcl Swelling and Other (See Comments)    Makes tongue swell (lotrel)   Oxycodone Itching   Phenergan [Promethazine Hcl] Other (See Comments)    "I can't remember."   Hydrocodone Itching    Can tolerate in low doses    Outpatient Encounter Medications as of 08/26/2020  Medication Sig Note   albuterol (PROVENTIL) (2.5 MG/3ML) 0.083% nebulizer solution Take 3 mLs (2.5 mg total) by nebulization every 6 (six) hours as needed for wheezing or shortness of breath.    albuterol (VENTOLIN HFA) 108 (90 Base) MCG/ACT inhaler Inhale 2 puffs into the lungs every 6 (six) hours as needed for wheezing or shortness of breath.    atorvastatin (LIPITOR) 40 MG tablet Take 40 mg by mouth daily. 1 Tablet Daily    bisoprolol  (ZEBETA) 5 MG tablet Take 1/2 (one-half) tablet by mouth once daily (Patient taking differently: Take 2.5 mg by mouth daily.)    Cholecalciferol (VITAMIN D3) 125 MCG (5000 UT) TABS Take 5,000 Units by mouth daily.    cloNIDine (CATAPRES) 0.2 MG tablet Take 1 tablet (0.2 mg total) by mouth 3 (three) times daily.    colchicine 0.6 MG tablet Take 0.6 mg by mouth daily. As needed    dapagliflozin propanediol (FARXIGA) 5 MG TABS tablet Take 1 tablet (5 mg total) by mouth daily.    famotidine (PEPCID) 20 MG tablet Take 1 tablet (20 mg total) by mouth at bedtime.    FEROSUL 325 (65 Fe) MG tablet Take 325 mg by mouth 3 (three) times daily.    furosemide (LASIX) 40 MG tablet Take 40 mg by mouth daily as needed (fluid retention.).    hydrALAZINE (APRESOLINE) 25 MG tablet Take 3 tablets (75 mg total) by mouth 3 (three) times daily.    isosorbide dinitrate (ISORDIL) 20 MG tablet Take 1 tablet (20 mg total) by mouth 3 (three) times daily.    MYRBETRIQ 25 MG TB24 tablet Take 1 tablet by mouth once daily    nitroGLYCERIN (NITROSTAT) 0.4 MG SL tablet Place 1 tablet (0.4 mg total) under the tongue every 5 (five) minutes x 3 doses as needed for chest pain. 07/15/2020: On hand   omeprazole (PRILOSEC) 40 MG capsule Take 1 capsule (40 mg total) by mouth 2 (two) times daily at 8 am and 10 pm. Take 30-60 minutes prior to breakfast.    ONETOUCH ULTRA test strip  USE 1 STRIP TO CHECK GLUCOSE ONCE DAILY    polyethylene glycol (MIRALAX / GLYCOLAX) 17 g packet Take 17 g by mouth as needed for mild constipation.    RESTASIS 0.05 % ophthalmic emulsion Place 1 drop into both eyes in the morning and at bedtime.    Semaglutide,0.25 or 0.5MG/DOS, (OZEMPIC, 0.25 OR 0.5 MG/DOSE,) 2 MG/1.5ML SOPN Inject 0.25 mg into the skin once a week.    sertraline (ZOLOFT) 100 MG tablet Take 1 tablet (100 mg total) by mouth daily.    tamsulosin (FLOMAX) 0.4 MG CAPS capsule Take 1 capsule (0.4 mg total) by mouth daily after supper.    [DISCONTINUED]  Budeson-Glycopyrrol-Formoterol (BREZTRI AEROSPHERE) 160-9-4.8 MCG/ACT AERO Inhale 2 puffs into the lungs in the morning and at bedtime.    No facility-administered encounter medications on file as of 08/26/2020.    Patient Active Problem List   Diagnosis Date Noted   Asthma exacerbation 07/09/2020   Chest pain 06/06/2020   Right upper lobe pneumonia 05/07/2020   Irritable larynx 05/07/2020   Hyperkalemia 06/05/2019   Dizziness 06/04/2019   Near syncope 06/04/2019   Insomnia    CHF (congestive heart failure) ([place]) 01/06/2018   Depression with anxiety 01/04/2018   CKD (chronic kidney disease), stage III ([place]) 01/04/2018   Chronic obstructive pulmonary disease ([place]) 01/04/2018   Depression, recurrent ([place]) 11/11/2017   Chronic diastolic heart failure ([place]) 04/13/2017   Swelling of lower extremity 11/30/2016   Obesity, Class III, BMI 40-49.9 (morbid obesity) ([place]) 08/21/2015   Morbid obesity ([place]) 08/21/2015   Coronary artery disease involving native coronary artery of native heart without angina pectoris 12/05/2014   Urinary urgency 10/15/2014   Spinal stenosis, lumbar region, with neurogenic claudication 01/02/2014   Hyperlipidemia associated with type 2 diabetes mellitus ([place]) 10/25/2013   Hyperlipidemia, unspecified 10/25/2013   COLONIC POLYPS, ADENOMATOUS 02/25/2007   Diabetes mellitus type II, non insulin dependent ([place]) 02/25/2007   Gout 02/25/2007   Essential hypertension 02/25/2007   Cough variant asthma 02/25/2007   OSA on CPAP 02/25/2007   FATIGUE, CHRONIC 02/25/2007   HEADACHE, CHRONIC 02/25/2007   GERD (gastroesophageal reflux disease) 02/25/2007    Conditions to be addressed/monitored:CHF, CAD, and HTN  Care Plan : RNCM: Hypertension (Adult)  Updates made by Ilean China, RN since 09/01/2020 12:00 AM     Problem: Hypertension (Hypertension)   Priority: Medium     Long-Range Goal: Hypertension Managed   Start Date: 05/14/2020  This Visit's Progress: Not on  track  Recent Progress: Not on track  Priority: Medium  Note:   Objective:  BP Readings from Last 3 Encounters:  08/25/20 (!) 87/53  08/18/20 126/76  08/13/20 138/90   Current Barriers:  Knowledge Deficits related to uncontrolled hypertension Chronic Disease Management support and education needs related to hypertension in a patient with CAD and CHF Blood pressure variations between home readings and office readings Labile blood pressures  Case Manager Clinical Goal(s):  patient will demonstrate improved adherence to prescribed treatment plan for hypertension as evidenced by taking all medications as prescribed, monitoring and recording blood pressure as directed, adhering to low sodium/DASH diet patient will demonstrate improved health management independence as evidenced by checking blood pressure as directed and notifying PCP if SBP>160 or DBP > 90, taking all medications as prescribe, and adhering to a low sodium diet as discussed. Patient will work with cardiologist/nephrologist regarding medical management of hypertension Patient will work with Consulting civil engineer regarding self management of hypertension  Interventions:  Collaboration  with [name], [name], MD regarding development and update of comprehensive plan of care as evidenced by provider attestation and co-signature Inter-disciplinary care team collaboration (see longitudinal plan of care) Evaluation of current treatment plan related to hypertension self management and patient's adherence to plan as established by provider. Reviewed medications with patient and verified compliance Advised patient, providing education and rationale, to monitor blood pressure daily and record, calling PCP for findings outside established parameters.  Talked with patient by telephone regarding home blood pressure readings since medication adjustment by cardiologist yesterday for hypotension. Patient took the following measurements while on the  phone with me. 84/53 left wrist  160/96 left wrist 145/82 left wrist  149/95 left arm 136/86 right arm 137/88 right wrist Discussed home blood pressure cuffs/monitor and verbal education provided on how to choose an appropriate size cuff and the potential for inaccurate results if using a cuff that is too small or too big Has a wrist and arm cuff and has checked wrist cuff accuracy against office blood monitor Encouraged patient to reach out to [facility] as needed Reviewed upcoming appointments and encouraged to keep all medical appointments Discussed plans with patient for ongoing care management follow up and provided patient with direct contact information for care management team  Self-Care Activities: Self administers medications as prescribed Attends all scheduled provider appointments Calls provider office for new concerns, questions, or BP outside discussed parameters Checks BP and records as discussed  Patient Goals: check blood pressure several times today write blood pressure results in a log or diary Keep all medical appointments Take medications as directed Call cardiologist with any lower readings or with any new or worsening symptoms Seek appropriate medical attention for any new or worsening symptoms Call [facility] with any nursing needs [phone]  Follow Up Plan:  Telephone follow-up scheduled with RNCM on 09/25/20 The patient has been provided with contact information for the care management team and has been advised to call with any health related questions or concerns.       [name], BSN, RN-BC Embedded Chronic Care Manager [facility] Direct Dial: [phone]

## 2020-09-02 ENCOUNTER — Ambulatory Visit: Payer: PPO | Admitting: Licensed Clinical Social Worker

## 2020-09-02 DIAGNOSIS — I10 Essential (primary) hypertension: Secondary | ICD-10-CM

## 2020-09-02 DIAGNOSIS — E1142 Type 2 diabetes mellitus with diabetic polyneuropathy: Secondary | ICD-10-CM

## 2020-09-02 DIAGNOSIS — I5042 Chronic combined systolic (congestive) and diastolic (congestive) heart failure: Secondary | ICD-10-CM

## 2020-09-02 DIAGNOSIS — N1832 Chronic kidney disease, stage 3b: Secondary | ICD-10-CM

## 2020-09-02 DIAGNOSIS — F339 Major depressive disorder, recurrent, unspecified: Secondary | ICD-10-CM

## 2020-09-02 DIAGNOSIS — I2510 Atherosclerotic heart disease of native coronary artery without angina pectoris: Secondary | ICD-10-CM

## 2020-09-02 DIAGNOSIS — J449 Chronic obstructive pulmonary disease, unspecified: Secondary | ICD-10-CM

## 2020-09-02 NOTE — Chronic Care Management (AMB) (Signed)
Chronic Care Management    Clinical Social Work Note  09/02/2020 Name: Kenneth Fuller MRN: [id number] DOB: [birth year]  Kenneth Fuller is a 75 y.o. year old male who is a primary care patient of [name], [name], MD. The CCM team was consulted to assist the patient with chronic disease management and/or care coordination needs related to: [name] .   Engaged with patient by telephone for follow up visit in response to provider referral for social work chronic care management and care coordination services.   Consent to Services:  The patient was given information about Chronic Care Management services, agreed to services, and gave verbal consent prior to initiation of services.  Please see initial visit note for detailed documentation.   Patient agreed to services and consent obtained.   Assessment: Review of patient past medical history, allergies, medications, and health status, including review of relevant consultants reports was performed today as part of a comprehensive evaluation and provision of chronic care management and care coordination services.     SDOH (Social Determinants of Health) assessments and interventions performed:  SDOH Interventions    Flowsheet Row Most Recent Value  SDOH Interventions   Depression Interventions/Treatment  Currently on Treatment        Advanced Directives Status: See Vynca application for related entries.  CCM Care Plan  Allergies  Allergen Reactions   Amlodipine Besy-Benazepril Hcl Swelling and Other (See Comments)    Makes tongue swell (lotrel)   Oxycodone Itching   Phenergan [Promethazine Hcl] Other (See Comments)    "I can't remember."   Hydrocodone Itching    Can tolerate in low doses    Outpatient Encounter Medications as of 09/02/2020  Medication Sig Note   albuterol (PROVENTIL) (2.5 MG/3ML) 0.083% nebulizer solution Take 3 mLs (2.5 mg total) by nebulization every 6 (six) hours as needed for wheezing or  shortness of breath.    albuterol (VENTOLIN HFA) 108 (90 Base) MCG/ACT inhaler Inhale 2 puffs into the lungs every 6 (six) hours as needed for wheezing or shortness of breath.    atorvastatin (LIPITOR) 40 MG tablet Take 40 mg by mouth daily. 1 Tablet Daily    bisoprolol (ZEBETA) 5 MG tablet Take 1/2 (one-half) tablet by mouth once daily (Patient taking differently: Take 2.5 mg by mouth daily.)    Budeson-Glycopyrrol-Formoterol (BREZTRI AEROSPHERE) 160-9-4.8 MCG/ACT AERO Inhale 2 puffs into the lungs in the morning and at bedtime.    Cholecalciferol (VITAMIN D3) 125 MCG (5000 UT) TABS Take 5,000 Units by mouth daily.    cloNIDine (CATAPRES) 0.2 MG tablet Take 1 tablet (0.2 mg total) by mouth 3 (three) times daily.    colchicine 0.6 MG tablet Take 1 tablet by mouth once daily    dapagliflozin propanediol (FARXIGA) 5 MG TABS tablet Take 1 tablet (5 mg total) by mouth daily.    famotidine (PEPCID) 20 MG tablet Take 1 tablet (20 mg total) by mouth at bedtime.    FEROSUL 325 (65 Fe) MG tablet Take 325 mg by mouth 3 (three) times daily.    furosemide (LASIX) 40 MG tablet Take 40 mg by mouth daily as needed (fluid retention.).    hydrALAZINE (APRESOLINE) 25 MG tablet Take 3 tablets (75 mg total) by mouth 3 (three) times daily.    isosorbide dinitrate (ISORDIL) 20 MG tablet Take 1 tablet (20 mg total) by mouth 3 (three) times daily.    MYRBETRIQ 25 MG TB24 tablet Take 1 tablet by mouth once daily  nitroGLYCERIN (NITROSTAT) 0.4 MG SL tablet Place 1 tablet (0.4 mg total) under the tongue every 5 (five) minutes x 3 doses as needed for chest pain. 07/15/2020: On hand   omeprazole (PRILOSEC) 40 MG capsule TAKE 1 CAPSULE BY MOUTH ONCE DAILY 30 TO 60 MINUTES PRIOR TO BREAKFAST    ONETOUCH ULTRA test strip USE 1 STRIP TO CHECK GLUCOSE ONCE DAILY    polyethylene glycol (MIRALAX / GLYCOLAX) 17 g packet Take 17 g by mouth as needed for mild constipation.    RESTASIS 0.05 % ophthalmic emulsion Place 1 drop into both  eyes in the morning and at bedtime.    Semaglutide,0.25 or 0.5MG/DOS, (OZEMPIC, 0.25 OR 0.5 MG/DOSE,) 2 MG/1.5ML SOPN Inject 0.25 mg into the skin once a week.    sertraline (ZOLOFT) 100 MG tablet Take 1 tablet (100 mg total) by mouth daily.    tamsulosin (FLOMAX) 0.4 MG CAPS capsule Take 1 capsule (0.4 mg total) by mouth daily after supper.    No facility-administered encounter medications on file as of 09/02/2020.    Patient Active Problem List   Diagnosis Date Noted   Asthma exacerbation 07/09/2020   Chest pain 06/06/2020   Right upper lobe pneumonia 05/07/2020   Irritable larynx 05/07/2020   Hyperkalemia 06/05/2019   Dizziness 06/04/2019   Near syncope 06/04/2019   Insomnia    CHF (congestive heart failure) ([place]) 01/06/2018   Depression with anxiety 01/04/2018   CKD (chronic kidney disease), stage III ([place]) 01/04/2018   Chronic obstructive pulmonary disease ([place]) 01/04/2018   Depression, recurrent ([place]) 11/11/2017   Chronic diastolic heart failure ([place]) 04/13/2017   Swelling of lower extremity 11/30/2016   Obesity, Class III, BMI 40-49.9 (morbid obesity) ([place]) 08/21/2015   Morbid obesity ([place]) 08/21/2015   Coronary artery disease involving native coronary artery of native heart without angina pectoris 12/05/2014   Urinary urgency 10/15/2014   Spinal stenosis, lumbar region, with neurogenic claudication 01/02/2014   Hyperlipidemia associated with type 2 diabetes mellitus ([place]) 10/25/2013   Hyperlipidemia, unspecified 10/25/2013   COLONIC POLYPS, ADENOMATOUS 02/25/2007   Diabetes mellitus type II, non insulin dependent ([place]) 02/25/2007   Gout 02/25/2007   Essential hypertension 02/25/2007   Cough variant asthma 02/25/2007   OSA on CPAP 02/25/2007   FATIGUE, CHRONIC 02/25/2007   HEADACHE, CHRONIC 02/25/2007   GERD (gastroesophageal reflux disease) 02/25/2007    Conditions to be addressed/monitored: monitor client management of health needs; monitor client management of  anxiety and stress issues faced   Care Plan : LCSW Care plan  Updates made by Katha Cabal, LCSW since 09/02/2020 12:00 AM     Problem: Coping Skills (General Plan of Care)      Goal: Manage Health needs and manage anxiety or stress issues related to health needs   Start Date: 09/02/2020  Expected End Date: 11/25/2020  This Visit's Progress: On track  Recent Progress: On track  Priority: Medium  Note:   Current barriers:   Patient in need of assistance with connecting to community resources for possible assistance in managing his ongoing medical needs Patient is unable to independently navigate community resource options without care coordination support Mobility issues Pain issues  Clinical Goals:  patient will work with SW in next 30 days to address concerns related to health issues of client and management of health issues of client Patient will call RNCM or LCSW as needed in next 30 days for CCM support Patient will attend all scheduled client medical appointments in next 30 days  Clinical Interventions:  Collaboration with [name], [name], MD regarding development and update of comprehensive plan of care as evidenced by provider attestation and co-signature Talked with client about breathing issues of client (client said he uses inhaler as needed and uses nebulizer for treatments as prescribed) Talked with client about vision issues of client Talked with client about support with RNCM Talked with client about ADLs completion Talked with client about sleeping issues of client 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 [name], [facility] Pharmacist  Patient Coping Skills/Strengths: Has family support Has no transport issues Attends scheduled  medical appointments Takes medications as prescribed Completes ADLs independently  Patient Deficits: Some mobility challenges Pain issues Breathing challenges  Patient Goals:  Patient will attend all scheduled client medical appointments in next 30 days Patient will call RNCM or LCSW as needed for CCM support in next 30 days Patient will communicate regularly with his spouse in next 30 days to discuss needs of client  Follow Up Plan:  LCSW to call client or his spouse on 10/10/20    [name].Buffey Zabinski MSW, LCSW Licensed Clinical Social Worker [facility] Care Management [phone]

## 2020-09-02 NOTE — Patient Instructions (Signed)
Visit Information  PATIENT GOALS:  Goals Addressed             This Visit's Progress    manage health issues of client; manage anxiety issues of client       Timeframe:  Short Term Goal Priority:  Medium Progress; On Track  Start Date: 09/02/20                             Expected End Date: 11/25/20                Follow Up Date 10/10/20   Protect My Health (Patient)  Manage Health issues of client; manage anxiety issues of client    Why is this important?   Screening tests can find diseases early when they are easier to treat.  Your doctor or nurse will talk with you about which tests are important for you.  Getting shots for common diseases like the flu and shingles will help prevent them.     Patient Coping Skills/Strengths: Has family support Has no transport issues Attends scheduled medical appointments Takes medications as prescribed Completes ADLs independently  Patient Deficits: Some mobility challenges Pain issues Breathing challenges  Patient Goals:  Patient will attend all scheduled client medical appointments in next 30 days Patient will call RNCM or LCSW as needed for CCM support in next 30 days Patient will communicate regularly with his spouse in next 30 days to discuss needs of client  Follow Up Plan:  LCSW to call client or his spouse on 10/10/20   [name].Franchesca Veneziano MSW, LCSW Licensed Clinical Social Worker [facility] Care Management [phone]

## 2020-09-21 ENCOUNTER — Other Ambulatory Visit: Payer: Self-pay | Admitting: Family Medicine

## 2020-09-23 ENCOUNTER — Ambulatory Visit (INDEPENDENT_AMBULATORY_CARE_PROVIDER_SITE_OTHER): Payer: PPO | Admitting: Family Medicine

## 2020-09-23 ENCOUNTER — Other Ambulatory Visit: Payer: Self-pay

## 2020-09-23 ENCOUNTER — Encounter: Payer: Self-pay | Admitting: Family Medicine

## 2020-09-23 VITALS — BP 163/83 | HR 66 | Ht 67.0 in | Wt 278.0 lb

## 2020-09-23 DIAGNOSIS — J449 Chronic obstructive pulmonary disease, unspecified: Secondary | ICD-10-CM | POA: Diagnosis not present

## 2020-09-23 DIAGNOSIS — J45991 Cough variant asthma: Secondary | ICD-10-CM | POA: Diagnosis not present

## 2020-09-23 NOTE — Progress Notes (Signed)
BP (!) 163/83   Pulse 66   Ht _0  (1.702 m)   Wt 278 lb (126.1 kg)   SpO2 95%   BMI 43.54 kg/m    Subjective:   Patient ID: Kenneth Fuller, male    DOB: September 16, 1945, 75 y.o.   MRN: [id number]  HPI: Kenneth Fuller is a 75 y.o. male presenting on 09/23/2020 for COPD (5 week follow up)   HPI COPD and cough variant asthma Patient is coming in for COPD recheck today.  He is currently on breztri and albuterol.  He has a mild chronic cough but denies any major coughing spells or wheezing spells.  He has 5nighttime symptoms per week and 5daytime symptoms per week currently.   Relevant past medical, surgical, family and social history reviewed and updated as indicated. Interim medical history since our last visit reviewed. Allergies and medications reviewed and updated.  Review of Systems  Constitutional:  Negative for chills and fever.  Eyes:  Negative for visual disturbance.  Respiratory:  Positive for cough, shortness of breath and wheezing.   Cardiovascular:  Negative for chest pain, palpitations and leg swelling.  Musculoskeletal:  Negative for arthralgias, back pain and gait problem.  Skin:  Negative for rash.  All other systems reviewed and are negative.  Per HPI unless specifically indicated above   Allergies as of 09/23/2020       Reactions   Amlodipine Besy-benazepril Hcl Swelling, Other (See Comments)   Makes tongue swell (lotrel)   Oxycodone Itching   Phenergan [promethazine Hcl] Other (See Comments)   "I can't remember."   Hydrocodone Itching   Can tolerate in low doses        Medication List        Accurate as of September 23, 2020  2:48 PM. If you have any questions, ask your nurse or doctor.          albuterol 108 (90 Base) MCG/ACT inhaler Commonly known as: VENTOLIN HFA Inhale 2 puffs into the lungs every 6 (six) hours as needed for wheezing or shortness of breath.   albuterol (2.5 MG/3ML) 0.083% nebulizer solution Commonly known as:  PROVENTIL Take 3 mLs (2.5 mg total) by nebulization every 6 (six) hours as needed for wheezing or shortness of breath.   atorvastatin 40 MG tablet Commonly known as: LIPITOR Take 40 mg by mouth daily. 1 Tablet Daily   bisoprolol 5 MG tablet Commonly known as: ZEBETA Take 1/2 (one-half) tablet by mouth once daily What changed: See the new instructions.   Breztri Aerosphere 160-9-4.8 MCG/ACT Aero Generic drug: Budeson-Glycopyrrol-Formoterol Inhale 2 puffs into the lungs in the morning and at bedtime.   cloNIDine 0.2 MG tablet Commonly known as: CATAPRES TAKE 1 TABLET BY MOUTH THREE TIMES DAILY   colchicine 0.6 MG tablet Take 1 tablet by mouth once daily   dapagliflozin propanediol 5 MG Tabs tablet Commonly known as: Farxiga Take 1 tablet (5 mg total) by mouth daily.   famotidine 20 MG tablet Commonly known as: PEPCID Take 1 tablet (20 mg total) by mouth at bedtime.   FeroSul 325 (65 FE) MG tablet Generic drug: ferrous sulfate Take 325 mg by mouth 3 (three) times daily.   furosemide 40 MG tablet Commonly known as: LASIX Take 40 mg by mouth daily as needed (fluid retention.).   hydrALAZINE 25 MG tablet Commonly known as: APRESOLINE Take 3 tablets (75 mg total) by mouth 3 (three) times daily.   isosorbide dinitrate 20 MG tablet Commonly known  as: ISORDIL Take 1 tablet (20 mg total) by mouth 3 (three) times daily.   Myrbetriq 25 MG Tb24 tablet Generic drug: mirabegron ER Take 1 tablet by mouth once daily   nitroGLYCERIN 0.4 MG SL tablet Commonly known as: NITROSTAT Place 1 tablet (0.4 mg total) under the tongue every 5 (five) minutes x 3 doses as needed for chest pain.   omeprazole 40 MG capsule Commonly known as: PRILOSEC TAKE 1 CAPSULE BY MOUTH ONCE DAILY 30 TO 60 MINUTES PRIOR TO BREAKFAST   OneTouch Ultra test strip Generic drug: glucose blood USE 1 STRIP TO CHECK GLUCOSE ONCE DAILY   Ozempic (0.25 or 0.5 MG/DOSE) 2 MG/1.5ML Sopn Generic drug:  Semaglutide(0.25 or 0.5MG/DOS) Inject 0.25 mg into the skin once a week.   polyethylene glycol 17 g packet Commonly known as: MIRALAX / GLYCOLAX Take 17 g by mouth as needed for mild constipation.   Restasis 0.05 % ophthalmic emulsion Generic drug: cycloSPORINE Place 1 drop into both eyes in the morning and at bedtime.   sertraline 100 MG tablet Commonly known as: ZOLOFT Take 1 tablet (100 mg total) by mouth daily.   tamsulosin 0.4 MG Caps capsule Commonly known as: FLOMAX Take 1 capsule (0.4 mg total) by mouth daily after supper.   Vitamin D3 125 MCG (5000 UT) Tabs Take 5,000 Units by mouth daily.         Objective:   BP (!) 163/83   Pulse 66   Ht _0  (1.702 m)   Wt 278 lb (126.1 kg)   SpO2 95%   BMI 43.54 kg/m   Wt Readings from Last 3 Encounters:  09/23/20 278 lb (126.1 kg)  08/18/20 269 lb (122 kg)  08/13/20 266 lb 6.4 oz (120.8 kg)    Physical Exam Vitals and nursing note reviewed.  Constitutional:      General: He is not in acute distress.    Appearance: He is well-developed. He is not diaphoretic.  Eyes:     General: No scleral icterus.    Conjunctiva/sclera: Conjunctivae normal.  Neck:     Thyroid: No thyromegaly.  Cardiovascular:     Rate and Rhythm: Normal rate and regular rhythm.     Heart sounds: Normal heart sounds. No murmur heard. Pulmonary:     Effort: Pulmonary effort is normal. No respiratory distress.     Breath sounds: Normal breath sounds. No wheezing, rhonchi or rales.  Chest:     Chest wall: No tenderness.  Musculoskeletal:        General: Normal range of motion.     Cervical back: Neck supple.  Lymphadenopathy:     Cervical: No cervical adenopathy.  Skin:    General: Skin is warm and dry.     Findings: No rash.  Neurological:     Mental Status: He is alert and oriented to person, place, and time.     Coordination: Coordination normal.  Psychiatric:        Behavior: Behavior normal.      Assessment & Plan:    Problem List Items Addressed This Visit       Respiratory   Cough variant asthma - Primary   Chronic obstructive pulmonary disease ([place])    Doing much better, still having some issues with it but doing much better.  Follow-up with his pulmonologist soon Follow up plan: Return in about 2 months (around 11/23/2020), or if symptoms worsen or fail to improve, for COPD and asthma.  Counseling provided for all of the  vaccine components No orders of the defined types were placed in this encounter.   Caryl Pina, MD [facility] 09/23/2020, 2:48 PM

## 2020-09-24 ENCOUNTER — Other Ambulatory Visit: Payer: Self-pay | Admitting: Family Medicine

## 2020-09-25 ENCOUNTER — Telehealth: Payer: PPO | Admitting: *Deleted

## 2020-09-27 DIAGNOSIS — G4733 Obstructive sleep apnea (adult) (pediatric): Secondary | ICD-10-CM | POA: Diagnosis not present

## 2020-09-27 DIAGNOSIS — R0902 Hypoxemia: Secondary | ICD-10-CM | POA: Diagnosis not present

## 2020-09-27 DIAGNOSIS — I5032 Chronic diastolic (congestive) heart failure: Secondary | ICD-10-CM | POA: Diagnosis not present

## 2020-10-10 ENCOUNTER — Ambulatory Visit (INDEPENDENT_AMBULATORY_CARE_PROVIDER_SITE_OTHER): Payer: PPO | Admitting: Licensed Clinical Social Worker

## 2020-10-10 DIAGNOSIS — I5042 Chronic combined systolic (congestive) and diastolic (congestive) heart failure: Secondary | ICD-10-CM

## 2020-10-10 DIAGNOSIS — E1142 Type 2 diabetes mellitus with diabetic polyneuropathy: Secondary | ICD-10-CM

## 2020-10-10 DIAGNOSIS — J449 Chronic obstructive pulmonary disease, unspecified: Secondary | ICD-10-CM

## 2020-10-10 DIAGNOSIS — N1832 Chronic kidney disease, stage 3b: Secondary | ICD-10-CM

## 2020-10-10 DIAGNOSIS — F339 Major depressive disorder, recurrent, unspecified: Secondary | ICD-10-CM

## 2020-10-10 DIAGNOSIS — I2510 Atherosclerotic heart disease of native coronary artery without angina pectoris: Secondary | ICD-10-CM

## 2020-10-10 DIAGNOSIS — I10 Essential (primary) hypertension: Secondary | ICD-10-CM

## 2020-10-10 DIAGNOSIS — F418 Other specified anxiety disorders: Secondary | ICD-10-CM

## 2020-10-10 NOTE — Chronic Care Management (AMB) (Signed)
Chronic Care Management    Clinical Social Work Note  10/10/2020 Name: Kenneth Fuller MRN: [id number] DOB: August 02, 1945  Kenneth Fuller is a 75 y.o. year old male who is a primary care patient of [name], [name], MD. The CCM team was consulted to assist the patient with chronic disease management and/or care coordination needs related to: [name] .   Engaged with patient by telephone for follow up visit in response to provider referral for social work chronic care management and care coordination services.   Consent to Services:  The patient was given information about Chronic Care Management services, agreed to services, and gave verbal consent prior to initiation of services.  Please see initial visit note for detailed documentation.   Patient agreed to services and consent obtained.   Assessment: Review of patient past medical history, allergies, medications, and health status, including review of relevant consultants reports was performed today as part of a comprehensive evaluation and provision of chronic care management and care coordination services.     SDOH (Social Determinants of Health) assessments and interventions performed:  SDOH Interventions    Flowsheet Row Most Recent Value  SDOH Interventions   Physical Activity Interventions Other (Comments)  [challenges in walking.  Has to walk slower to complete needed activities. Still driving.]  Depression Interventions/Treatment  Counseling, Currently on Treatment        Advanced Directives Status: See Vyn[employer] application for related entries.  CCM Care Plan  Allergies  Allergen Reactions   Amlodipine Besy-Benazepril Hcl Swelling and Other (See Comments)    Makes tongue swell (lotrel)   Oxycodone Itching   Phenergan [Promethazine Hcl] Other (See Comments)    "I can't remember."   Hydrocodone Itching    Can tolerate in low doses    Outpatient Encounter Medications as of 10/10/2020  Medication Sig  Note   albuterol (PROVENTIL) (2.5 MG/3ML) 0.083% nebulizer solution Take 3 mLs (2.5 mg total) by nebulization every 6 (six) hours as needed for wheezing or shortness of breath.    albuterol (VENTOLIN HFA) 108 (90 Base) MCG/ACT inhaler Inhale 2 puffs into the lungs every 6 (six) hours as needed for wheezing or shortness of breath.    atorvastatin (LIPITOR) 40 MG tablet Take 40 mg by mouth daily. 1 Tablet Daily    bisoprolol (ZEBETA) 5 MG tablet Take 1/2 (one-half) tablet by mouth once daily (Patient taking differently: Take 2.5 mg by mouth daily.)    Budeson-Glycopyrrol-Formoterol (BREZTRI AEROSPHERE) 160-9-4.8 MCG/ACT AERO Inhale 2 puffs into the lungs in the morning and at bedtime.    Cholecalciferol (VITAMIN D3) 125 MCG (5000 UT) TABS Take 5,000 Units by mouth daily.    cloNIDine (CATAPRES) 0.2 MG tablet TAKE 1 TABLET BY MOUTH THREE TIMES DAILY    colchicine 0.6 MG tablet Take 1 tablet by mouth once daily    dapagliflozin propanediol (FARXIGA) 5 MG TABS tablet Take 1 tablet (5 mg total) by mouth daily.    famotidine (PEPCID) 20 MG tablet Take 1 tablet (20 mg total) by mouth at bedtime.    FEROSUL 325 (65 Fe) MG tablet Take 325 mg by mouth 3 (three) times daily.    furosemide (LASIX) 40 MG tablet Take 40 mg by mouth daily as needed (fluid retention.).    hydrALAZINE (APRESOLINE) 25 MG tablet Take 3 tablets (75 mg total) by mouth 3 (three) times daily.    isosorbide dinitrate (ISORDIL) 20 MG tablet Take 1 tablet (20 mg total) by mouth 3 (three) times daily.  MYRBETRIQ 25 MG TB24 tablet Take 1 tablet by mouth once daily    nitroGLYCERIN (NITROSTAT) 0.4 MG SL tablet Place 1 tablet (0.4 mg total) under the tongue every 5 (five) minutes x 3 doses as needed for chest pain. 07/15/2020: On hand   omeprazole (PRILOSEC) 40 MG capsule TAKE 1 CAPSULE BY MOUTH ONCE DAILY 30 TO 60 MINUTES PRIOR TO BREAKFAST    ONETOUCH ULTRA test strip USE 1 STRIP TO CHECK GLUCOSE ONCE DAILY    polyethylene glycol (MIRALAX /  GLYCOLAX) 17 g packet Take 17 g by mouth as needed for mild constipation.    RESTASIS 0.05 % ophthalmic emulsion Place 1 drop into both eyes in the morning and at bedtime.    Semaglutide,0.25 or 0.5MG/DOS, (OZEMPIC, 0.25 OR 0.5 MG/DOSE,) 2 MG/1.5ML SOPN Inject 0.25 mg into the skin once a week.    sertraline (ZOLOFT) 100 MG tablet Take 1 tablet (100 mg total) by mouth daily.    tamsulosin (FLOMAX) 0.4 MG CAPS capsule Take 1 capsule (0.4 mg total) by mouth daily after supper.    No facility-administered encounter medications on file as of 10/10/2020.    Patient Active Problem List   Diagnosis Date Noted   Asthma exacerbation 07/09/2020   Chest pain 06/06/2020   Right upper lobe pneumonia 05/07/2020   Irritable larynx 05/07/2020   Hyperkalemia 06/05/2019   Dizziness 06/04/2019   Near syncope 06/04/2019   Insomnia    CHF (congestive heart failure) ([place]) 01/06/2018   Depression with anxiety 01/04/2018   CKD (chronic kidney disease), stage III ([place]) 01/04/2018   Chronic obstructive pulmonary disease ([place]) 01/04/2018   Depression, recurrent ([place]) 11/11/2017   Chronic diastolic heart failure ([place]) 04/13/2017   Swelling of lower extremity 11/30/2016   Obesity, Class III, BMI 40-49.9 (morbid obesity) ([place]) 08/21/2015   Morbid obesity ([place]) 08/21/2015   Coronary artery disease involving native coronary artery of native heart without angina pectoris 12/05/2014   Urinary urgency 10/15/2014   Spinal stenosis, lumbar region, with neurogenic claudication 01/02/2014   Hyperlipidemia associated with type 2 diabetes mellitus ([place]) 10/25/2013   Hyperlipidemia, unspecified 10/25/2013   COLONIC POLYPS, ADENOMATOUS 02/25/2007   Diabetes mellitus type II, non insulin dependent ([place]) 02/25/2007   Gout 02/25/2007   Essential hypertension 02/25/2007   Cough variant asthma 02/25/2007   OSA on CPAP 02/25/2007   FATIGUE, CHRONIC 02/25/2007   HEADACHE, CHRONIC 02/25/2007   GERD (gastroesophageal reflux  disease) 02/25/2007    Conditions to be addressed/monitored: monitor client management of ongoing health needs  Care Plan : LCSW Care plan  Updates made by Katha Cabal, LCSW since 10/10/2020 12:00 AM     Problem: Coping Skills (General Plan of Care)      Goal: Manage Health needs and manage anxiety or stress issues related to health needs   Start Date: 10/10/2020  Expected End Date: 01/08/2021  This Visit's Progress: On track  Recent Progress: On track  Priority: Medium  Note:   Current barriers:   Patient in need of assistance with connecting to community resources for possible assistance in managing his ongoing medical needs Patient is unable to independently navigate community resource options without care coordination support Mobility issues Pain issues  Clinical Goals:  patient will work with SW in next 30 days to address concerns related to health issues of client and management of health issues of client Patient will call RNCM or LCSW as needed in next 30 days for CCM support Patient will attend all scheduled client  medical appointments in next 30 days  Clinical Interventions:  Collaboration with [name], [name], MD regarding development and update of comprehensive plan of care as evidenced by provider attestation and co-signature Talked with client about breathing issues of client (client said he uses inhaler as needed and uses nebulizer for treatments as prescribed) Talked with client about vision issues of client Talked with client about support with RNCM Talked with client about sleeping issues of client (he uses Bi-Pap to help him sleep at night) Talked with client about family support (has support from his wife and daughter) Talked with client about medication procurement of client Talked with client about support related to medications  with Dr. [name], [facility] Pharmacist Talked with client about his support from nephrologist Talked with client about  energy level of client Encouraged client to call RNCM or LCSW as needed in next 30 days for CCM  program support  Patient Coping Skills/Strengths: Has family support Has no transport issues Attends scheduled medical appointments Takes medications as prescribed Completes ADLs independently  Patient Deficits: Some mobility challenges Pain issues Breathing challenges  Patient Goals:  Patient will attend all scheduled client medical appointments in next 30 days Patient will call RNCM or LCSW as needed for CCM support in next 30 days Patient will communicate regularly with his spouse in next 30 days to discuss needs of client  Follow Up Plan:  LCSW to call client or his spouse on 11/21/20 to assess needs of client     [name].Dorien Bessent MSW, LCSW Licensed Clinical Social Worker [facility] Care Management [phone]

## 2020-10-10 NOTE — Patient Instructions (Signed)
Visit Information  PATIENT GOALS:  Goals Addressed             This Visit's Progress    manage health issues of client; manage anxiety issues of client       Timeframe:  Short Term Goal Priority:  Medium Progress; On Track  Start Date: 10/10/20                             Expected End Date: 01/08/21                Follow Up Date 11/21/20   Protect My Health (Patient)  Manage Health issues of client; manage anxiety issues of client    Why is this important?   Screening tests can find diseases early when they are easier to treat.  Your doctor or nurse will talk with you about which tests are important for you.  Getting shots for common diseases like the flu and shingles will help prevent them.     Patient Coping Skills/Strengths: Has family support Has no transport issues Attends scheduled medical appointments Takes medications as prescribed Completes ADLs independently  Patient Deficits: Some mobility challenges Pain issues Breathing challenges  Patient Goals:  Patient will attend all scheduled client medical appointments in next 30 days Patient will call RNCM or LCSW as needed for CCM support in next 30 days Patient will communicate regularly with his spouse in next 30 days to discuss needs of client  Follow Up Plan:  LCSW to call client or his spouse on 11/21/20 to assess needs of client    [name].Lam Mccubbins MSW, LCSW Licensed Clinical Social Worker [facility] Care Management [phone]

## 2020-10-17 ENCOUNTER — Emergency Department (HOSPITAL_COMMUNITY): Payer: PPO

## 2020-10-17 ENCOUNTER — Telehealth: Payer: Self-pay

## 2020-10-17 ENCOUNTER — Emergency Department (HOSPITAL_COMMUNITY)
Admission: EM | Admit: 2020-10-17 | Discharge: 2020-10-17 | Disposition: A | Discharge status: Home / Self Care | Payer: PPO | Attending: Emergency Medicine | Admitting: Emergency Medicine

## 2020-10-17 ENCOUNTER — Other Ambulatory Visit: Payer: Self-pay

## 2020-10-17 ENCOUNTER — Telehealth: Payer: Self-pay | Admitting: Cardiovascular Disease

## 2020-10-17 DIAGNOSIS — E1122 Type 2 diabetes mellitus with diabetic chronic kidney disease: Secondary | ICD-10-CM | POA: Insufficient documentation

## 2020-10-17 DIAGNOSIS — I2510 Atherosclerotic heart disease of native coronary artery without angina pectoris: Secondary | ICD-10-CM | POA: Diagnosis not present

## 2020-10-17 DIAGNOSIS — M545 Low back pain, unspecified: Secondary | ICD-10-CM | POA: Diagnosis not present

## 2020-10-17 DIAGNOSIS — N1832 Chronic kidney disease, stage 3b: Secondary | ICD-10-CM

## 2020-10-17 DIAGNOSIS — J449 Chronic obstructive pulmonary disease, unspecified: Secondary | ICD-10-CM | POA: Diagnosis not present

## 2020-10-17 DIAGNOSIS — Z7951 Long term (current) use of inhaled steroids: Secondary | ICD-10-CM | POA: Diagnosis not present

## 2020-10-17 DIAGNOSIS — R079 Chest pain, unspecified: Secondary | ICD-10-CM | POA: Diagnosis not present

## 2020-10-17 DIAGNOSIS — I10 Essential (primary) hypertension: Secondary | ICD-10-CM | POA: Diagnosis not present

## 2020-10-17 DIAGNOSIS — Z87891 Personal history of nicotine dependence: Secondary | ICD-10-CM | POA: Diagnosis not present

## 2020-10-17 DIAGNOSIS — Z79899 Other long term (current) drug therapy: Secondary | ICD-10-CM | POA: Insufficient documentation

## 2020-10-17 DIAGNOSIS — J4521 Mild intermittent asthma with (acute) exacerbation: Secondary | ICD-10-CM | POA: Insufficient documentation

## 2020-10-17 DIAGNOSIS — Z794 Long term (current) use of insulin: Secondary | ICD-10-CM | POA: Insufficient documentation

## 2020-10-17 DIAGNOSIS — Z7984 Long term (current) use of oral hypoglycemic drugs: Secondary | ICD-10-CM | POA: Insufficient documentation

## 2020-10-17 DIAGNOSIS — N183 Chronic kidney disease, stage 3 unspecified: Secondary | ICD-10-CM | POA: Insufficient documentation

## 2020-10-17 DIAGNOSIS — R3915 Urgency of urination: Secondary | ICD-10-CM

## 2020-10-17 DIAGNOSIS — I130 Hypertensive heart and chronic kidney disease with heart failure and stage 1 through stage 4 chronic kidney disease, or unspecified chronic kidney disease: Secondary | ICD-10-CM | POA: Diagnosis not present

## 2020-10-17 DIAGNOSIS — R0789 Other chest pain: Secondary | ICD-10-CM

## 2020-10-17 DIAGNOSIS — R0689 Other abnormalities of breathing: Secondary | ICD-10-CM | POA: Diagnosis not present

## 2020-10-17 DIAGNOSIS — I5032 Chronic diastolic (congestive) heart failure: Secondary | ICD-10-CM | POA: Insufficient documentation

## 2020-10-17 DIAGNOSIS — E1142 Type 2 diabetes mellitus with diabetic polyneuropathy: Secondary | ICD-10-CM

## 2020-10-17 LAB — TROPONIN I (HIGH SENSITIVITY)
Troponin I (High Sensitivity): 8 ng/L (ref <18)
Troponin I (High Sensitivity): 8 ng/L (ref <18)

## 2020-10-17 LAB — CBC WITH DIFFERENTIAL/PLATELET
Abs Immature Granulocytes: 0.08 10*3/uL — ABNORMAL HIGH (ref 0.00–0.07)
Basophils Absolute: 0.1 10*3/uL (ref 0.0–0.1)
Basophils Relative: 1 %
Eosinophils Absolute: 0.2 10*3/uL (ref 0.0–0.5)
Eosinophils Relative: 2 %
HCT: 43.5 % (ref 39.0–52.0)
Hemoglobin: 14 g/dL (ref 13.0–17.0)
Immature Granulocytes: 1 %
Lymphocytes Relative: 22 %
Lymphs Abs: 1.7 10*3/uL (ref 0.7–4.0)
MCH: 29.4 pg (ref 26.0–34.0)
MCHC: 32.2 g/dL (ref 30.0–36.0)
MCV: 91.4 fL (ref 80.0–100.0)
Monocytes Absolute: 0.8 10*3/uL (ref 0.1–1.0)
Monocytes Relative: 10 %
Neutro Abs: 4.8 10*3/uL (ref 1.7–7.7)
Neutrophils Relative %: 64 %
Platelets: 211 10*3/uL (ref 150–400)
RBC: 4.76 MIL/uL (ref 4.22–5.81)
RDW: 13.9 % (ref 11.5–15.5)
WBC: 7.5 10*3/uL (ref 4.0–10.5)
nRBC: 0 % (ref 0.0–0.2)

## 2020-10-17 LAB — COMPREHENSIVE METABOLIC PANEL
ALT: 29 U/L (ref 0–44)
AST: 22 U/L (ref 15–41)
Albumin: 3.8 g/dL (ref 3.5–5.0)
Alkaline Phosphatase: 71 U/L (ref 38–126)
Anion gap: 7 (ref 5–15)
BUN: 16 mg/dL (ref 8–23)
CO2: 27 mmol/L (ref 22–32)
Calcium: 8.3 mg/dL — ABNORMAL LOW (ref 8.9–10.3)
Chloride: 102 mmol/L (ref 98–111)
Creatinine, Ser: 1.13 mg/dL (ref 0.61–1.24)
GFR, Estimated: >60 mL/min (ref >60)
Glucose, Bld: 135 mg/dL — ABNORMAL HIGH (ref 70–99)
Potassium: 3.9 mmol/L (ref 3.5–5.1)
Sodium: 136 mmol/L (ref 135–145)
Total Bilirubin: 0.6 mg/dL (ref 0.3–1.2)
Total Protein: 6.6 g/dL (ref 6.5–8.1)

## 2020-10-17 MED ORDER — CYCLOBENZAPRINE HCL 10 MG PO TABS
10.0000 mg | ORAL_TABLET | Freq: Once | ORAL | Status: AC
  Administered 2020-10-17: 10 mg via ORAL
  Filled 2020-10-17: qty 1

## 2020-10-17 NOTE — Telephone Encounter (Signed)
Patient's daughter called. Pt was in room with her on speaker phone.  She states that pt c/o lower back pain that radiated to his chest during the night. It lasted for about 5 min and went away and has not come back.  Pt has SOB but it is not any worse than it has been.   Advised pt that it is hard to dx over the phone and that he should be evaluated. We do not have any openings in the clinic today and the after hours schedule is full. Instructed that he should go to ER or at least an urgent care.  Daughter states that they do have nitroglycerin and wanted to know if pt should take that if it happens again. I advised yes. Informed that they could take ASA 43m as well.  Pt is hesitant to go anywhere to be checked, but is considering Urgent Care.

## 2020-10-17 NOTE — Discharge Instructions (Addendum)
Lab work and urine are all reassuring.  Please continue take all home medication as prescribed.  Please follow-up with your cardiologist for further evaluation.  Come back to the emergency department if you develop chest pain, shortness of breath, severe abdominal pain, uncontrolled nausea, vomiting, diarrhea.

## 2020-10-17 NOTE — Telephone Encounter (Signed)
   Pt's daughter is calling, she said they went to urgent care and they called ems for the pt and bringing him at the hospital, she would like to speak with the nurse again.

## 2020-10-17 NOTE — Telephone Encounter (Signed)
Patient's daughter is calling to let Dr Sallyanne Kuster know patient was seen by Urgent Care.  EMS has been called and patient will be transported to [facility]

## 2020-10-17 NOTE — Telephone Encounter (Signed)
Would like referral to kidney specialist because medication is not helping with frequent urination.

## 2020-10-17 NOTE — ED Triage Notes (Signed)
Pt reports cp that started ealier this week.  Reports pain worse last night and lasted 5-6 min.  Reports pain started in back and radiated to L chest and L arm.  Reports feeling dizzya nd lightheaded.  Resp even and unlabored.  Went to urgent care today and pt was sent to the ed.

## 2020-10-17 NOTE — ED Provider Notes (Addendum)
[facility] EMERGENCY DEPARTMENT Provider Note   CSN: [id number] Arrival date & time: 10/17/20  1728     History Chief Complaint  Patient presents with   Chest Pain    Kenneth Fuller is a 75 y.o. male.  HPI  Patient with significant medical history of CHF, COPD, fibromyalgia, diabetes, hypertension presents to the emergency department with chief complaint of chest pain, patient states this started last night, initially had some lower back pain which then radiate up into his back and then he started have some chest pain, he states the chest pain lasted for approximately 5 to 6 minutes then resolved on its own.  He states this morning he has slight left-sided chest pain, it radiated into his left shoulder but this has since resolved, he denies worsening shortness of breath, becoming diaphoretic, nausea, vomiting, lightheaded or dizziness.  No history of ACS, no history of DVTs or PEs, currently not on hormone therapy, denies worsening leg swelling, orthopnea, denies illicit drug use.  Currently he has a 3 of 10 pain, pain does not worsen with arm movement, not worsen with change in position or eating or drinking.  He has no other complaints at this time.  Does not endorse fevers, chills, abdominal pain, worsening pedal edema.  Past Medical History:  Diagnosis Date   Allergy    Asthma    CAD (coronary artery disease)    CHF (congestive heart failure) (HCC)    COPD (chronic obstructive pulmonary disease) (HCC)    Diabetes mellitus    Fibromyalgia    GERD (gastroesophageal reflux disease)    GI bleeding    Gout    Hyperlipidemia    Hypertension    Hypogonadism male    Insomnia    MRSA cellulitis    Neuropathy    Obesity    Shortness of breath dyspnea    with exertion    Sleep apnea    cpap- 14    Wheezing    no asthma diagnosis    Patient Active Problem List   Diagnosis Date Noted   Asthma exacerbation 07/09/2020   Chest pain 06/06/2020   Right upper lobe pneumonia  05/07/2020   Irritable larynx 05/07/2020   Hyperkalemia 06/05/2019   Dizziness 06/04/2019   Near syncope 06/04/2019   Insomnia    CHF (congestive heart failure) ([place]) 01/06/2018   Depression with anxiety 01/04/2018   CKD (chronic kidney disease), stage III ([place]) 01/04/2018   Chronic obstructive pulmonary disease ([place]) 01/04/2018   Depression, recurrent ([place]) 11/11/2017   Chronic diastolic heart failure ([place]) 04/13/2017   Swelling of lower extremity 11/30/2016   Obesity, Class III, BMI 40-49.9 (morbid obesity) ([place]) 08/21/2015   Morbid obesity ([place]) 08/21/2015   Coronary artery disease involving native coronary artery of native heart without angina pectoris 12/05/2014   Urinary urgency 10/15/2014   Spinal stenosis, lumbar region, with neurogenic claudication 01/02/2014   Hyperlipidemia associated with type 2 diabetes mellitus ([place]) 10/25/2013   Hyperlipidemia, unspecified 10/25/2013   COLONIC POLYPS, ADENOMATOUS 02/25/2007   Diabetes mellitus type II, non insulin dependent ([place]) 02/25/2007   Gout 02/25/2007   Essential hypertension 02/25/2007   Cough variant asthma 02/25/2007   OSA on CPAP 02/25/2007   FATIGUE, CHRONIC 02/25/2007   HEADACHE, CHRONIC 02/25/2007   GERD (gastroesophageal reflux disease) 02/25/2007    Past Surgical History:  Procedure Laterality Date   BACK SURGERY     BIOPSY  12/20/2019   Procedure: BIOPSY;  Surgeon: Milus Banister, MD;  Location:  [facility];  Service: Endoscopy;;   CARDIAC CATHETERIZATION     COLONOSCOPY     COLONOSCOPY WITH PROPOFOL N/A 12/20/2019   Procedure: COLONOSCOPY WITH PROPOFOL;  Surgeon: Milus Banister, MD;  Location: [facility];  Service: Endoscopy;  Laterality: N/A;   ESOPHAGOGASTRODUODENOSCOPY (EGD) WITH PROPOFOL N/A 12/20/2019   Procedure: ESOPHAGOGASTRODUODENOSCOPY (EGD) WITH PROPOFOL;  Surgeon: Milus Banister, MD;  Location: [facility];  Service: Endoscopy;  Laterality: N/A;   LEFT HEART CATH AND CORONARY ANGIOGRAPHY  N/A 12/02/2016   Procedure: LEFT HEART CATH AND CORONARY ANGIOGRAPHY;  Surgeon: Jettie Booze, MD;  Location: [place] [facility];  Service: Cardiovascular;  Laterality: N/A;   LUMBAR LAMINECTOMY/DECOMPRESSION MICRODISCECTOMY N/A 01/02/2014   Procedure: CENTRAL DECOMPRESSION LUMBAR LAMINECTOMY L3-L4, L4-L5;  Surgeon: Tobi Bastos, MD;  Location: [facility];  Service: Orthopedics;  Laterality: N/A;   neck fusion     POLYPECTOMY  12/20/2019   Procedure: POLYPECTOMY;  Surgeon: Milus Banister, MD;  Location: [facility];  Service: Endoscopy;;       Family History  Problem Relation Age of Onset   Colon cancer Mother    Diabetes Father        siblings   Heart disease Father        brother   Heart attack Father    Kidney disease Sister    Heart failure Sister    Heart disease Brother    Heart attack Son 32   Drug abuse Sister    Heart disease Brother    Deep vein thrombosis Brother    Colon polyps Neg Hx     Social History   Tobacco Use   Smoking status: Former    Packs/day: 1.00    Types: Cigarettes    Start date: 02/09/1964    Quit date: 02/08/1969    Years since quitting: 51.7   Smokeless tobacco: Former    Quit date: 1971  Scientific laboratory technician Use: Never used  Substance Use Topics   Alcohol use: No    Alcohol/week: 0.0 standard drinks   Drug use: No    Home Medications Prior to Admission medications   Medication Sig Start Date End Date Taking? Authorizing Provider  albuterol (PROVENTIL) (2.5 MG/3ML) 0.083% nebulizer solution Take 3 mLs (2.5 mg total) by nebulization every 6 (six) hours as needed for wheezing or shortness of breath. 07/10/20  Yes [name], [name], MD  albuterol (VENTOLIN HFA) 108 (90 Base) MCG/ACT inhaler Inhale 2 puffs into the lungs every 6 (six) hours as needed for wheezing or shortness of breath. 05/05/20  Yes Stacks, Cletus Gash, MD  amitriptyline (ELAVIL) 25 MG tablet Take 25 mg by mouth at bedtime.   Yes [provider]  atorvastatin  (LIPITOR) 40 MG tablet Take 40 mg by mouth daily. 1 Tablet Daily   Yes [provider]  bisoprolol (ZEBETA) 5 MG tablet Take 1/2 (one-half) tablet by mouth once daily Patient taking differently: Take 2.5 mg by mouth daily. 06/04/20  Yes [name], [name], MD  Budeson-Glycopyrrol-Formoterol (BREZTRI AEROSPHERE) 160-9-4.8 MCG/ACT AERO Inhale 2 puffs into the lungs in the morning and at bedtime. 08/29/20  Yes [name], [name], MD  Cholecalciferol (VITAMIN D3) 125 MCG (5000 UT) TABS Take 5,000 Units by mouth daily.   Yes [provider]  cloNIDine (CATAPRES) 0.2 MG tablet TAKE 1 TABLET BY MOUTH THREE TIMES DAILY 09/22/20  Yes [name], [name], MD  colchicine 0.6 MG tablet Take 1 tablet by mouth once daily 09/22/20  Yes [name], [name], MD  dapagliflozin propanediol (FARXIGA) 5 MG TABS tablet Take 1 tablet (5 mg total) by mouth daily. 01/02/20  Yes [name], [name], MD  famotidine (PEPCID) 20 MG tablet Take 1 tablet (20 mg total) by mouth at bedtime. 05/07/20  Yes Martyn Ehrich, NP  furosemide (LASIX) 40 MG tablet Take 40 mg by mouth daily as needed (fluid retention.).   Yes [provider]  hydrALAZINE (APRESOLINE) 25 MG tablet Take 3 tablets (75 mg total) by mouth 3 (three) times daily. Patient taking differently: Take 25 mg by mouth 3 (three) times daily. 07/18/20  Yes Croitoru, Mihai, MD  hydrALAZINE (APRESOLINE) 50 MG tablet Take 50 mg by mouth 3 (three) times daily. 10/05/20  Yes [provider]  isosorbide dinitrate (ISORDIL) 20 MG tablet Take 1 tablet (20 mg total) by mouth 3 (three) times daily. 08/18/20  Yes [name], [name], MD  lisinopril (ZESTRIL) 20 MG tablet Take 1 tablet by mouth daily. 06/25/18  Yes [provider]  MYRBETRIQ 25 MG TB24 tablet Take 1 tablet by mouth once daily 08/19/20  Yes [name], [name], MD  nitroGLYCERIN (NITROSTAT) 0.4 MG SL tablet Place under the tongue. 06/07/20  Yes [provider]  omeprazole  (PRILOSEC) 40 MG capsule TAKE 1 CAPSULE BY MOUTH ONCE DAILY 30 TO 60 MINUTES PRIOR TO BREAKFAST Patient taking differently: Take 40 mg by mouth daily. 09/01/20  Yes Milus Banister, MD  ondansetron [facility]) 4 MG tablet Take by mouth. 06/05/19  Yes [provider]  polyethylene glycol (MIRALAX / GLYCOLAX) 17 g packet Take 17 g by mouth as needed for mild constipation.   Yes [provider]  RESTASIS 0.05 % ophthalmic emulsion Place 1 drop into both eyes in the morning and at bedtime. 12/07/19  Yes [provider]  sertraline (ZOLOFT) 100 MG tablet Take 1 tablet (100 mg total) by mouth daily. 08/19/20  Yes [name], [name], MD  tamsulosin (FLOMAX) 0.4 MG CAPS capsule Take 1 capsule (0.4 mg total) by mouth daily after supper. 11/22/19  Yes [name], [name], MD  budesonide-formoterol [facility]) 160-4.5 MCG/ACT inhaler Inhale into the lungs. Patient not taking: No sig reported 01/09/18   [provider]  buPROPion (WELLBUTRIN XL) 150 MG 24 hr tablet Take by mouth. Patient not taking: No sig reported 12/14/17   [provider]  chlorthalidone (HYGROTON) 25 MG tablet Take by mouth. Patient not taking: No sig reported    [provider]  clonazePAM (KLONOPIN) 0.5 MG tablet Take by mouth. Patient not taking: No sig reported    [provider]  cycloSPORINE (RESTASIS) 0.05 % ophthalmic emulsion Apply to eye. Patient not taking: No sig reported 12/07/19   [provider]  FEROSUL 325 (65 Fe) MG tablet Take 325 mg by mouth 3 (three) times daily. Patient not taking: No sig reported 08/15/19   [provider]  glimepiride (AMARYL) 4 MG tablet Take by mouth. Patient not taking: No sig reported    [provider]  metFORMIN (GLUCOPHAGE) 1000 MG tablet Take by mouth. Patient not taking: No sig reported    [provider]  nitroGLYCERIN (NITROSTAT) 0.4 MG SL tablet Place 1 tablet (0.4 mg total) under the tongue  every 5 (five) minutes x 3 doses as needed for chest pain. 06/07/20 07/07/20  Manuella Ghazi, Pratik D, DO  ONETOUCH ULTRA test strip USE 1 STRIP TO CHECK GLUCOSE ONCE DAILY 07/25/20   [name], [name], MD  Semaglutide,0.25 or 0.5MG/DOS, (  OZEMPIC, 0.25 OR 0.5 MG/DOSE,) 2 MG/1.5ML SOPN Inject 0.25 mg into the skin once a week. Patient not taking: No sig reported    [provider]  sertraline (ZOLOFT) 100 MG tablet Take 1 tablet by mouth daily. Patient not taking: No sig reported 05/28/19   [provider]  spironolactone (ALDACTONE) 25 MG tablet Take by mouth. Patient not taking: No sig reported    [provider]    Allergies    Amlodipine besy-benazepril hcl, Oxycodone, Phenergan [promethazine hcl], and Hydrocodone  Review of Systems   Review of Systems  Constitutional:  Negative for chills and fever.  HENT:  Negative for congestion.   Respiratory:  Negative for shortness of breath.   Cardiovascular:  Positive for chest pain. Negative for palpitations and leg swelling.  Gastrointestinal:  Negative for abdominal pain, diarrhea, nausea and vomiting.  Genitourinary:  Negative for enuresis.  Musculoskeletal:  Negative for back pain.  Skin:  Negative for rash.  Neurological:  Negative for dizziness.  Hematological:  Does not bruise/bleed easily.   Physical Exam Updated Vital Signs BP (!) 189/85   Pulse 64   Temp 98.5 F (36.9 C)   Resp 20   Ht 5' 7" (1.702 m)   Wt 120.2 kg   SpO2 95%   BMI 41.50 kg/m   Physical Exam Vitals and nursing note reviewed.  Constitutional:      General: He is not in acute distress.    Appearance: He is not ill-appearing.  HENT:     Head: Normocephalic and atraumatic.     Nose: No congestion.  Eyes:     Conjunctiva/sclera: Conjunctivae normal.  Cardiovascular:     Rate and Rhythm: Normal rate and regular rhythm.     Pulses: Normal pulses.     Heart sounds: No murmur heard.   No friction rub. No gallop.     Comments: Chest  pain is reproducible with palpation. Pulmonary:     Effort: No respiratory distress.     Breath sounds: No wheezing, rhonchi or rales.  Musculoskeletal:     Right lower leg: No edema.     Left lower leg: No edema.  Skin:    General: Skin is warm and dry.  Neurological:     Mental Status: He is alert.  Psychiatric:        Mood and Affect: Mood normal.    ED Results / Procedures / Treatments   Labs (all labs ordered are listed, but only abnormal results are displayed) Labs Reviewed  COMPREHENSIVE METABOLIC PANEL - Abnormal; Notable for the following components:      Result Value   Glucose, Bld 135 (*)    Calcium 8.3 (*)    All other components within normal limits  CBC WITH DIFFERENTIAL/PLATELET - Abnormal; Notable for the following components:   Abs Immature Granulocytes 0.08 (*)    All other components within normal limits  TROPONIN I (HIGH SENSITIVITY)  TROPONIN I (HIGH SENSITIVITY)    EKG EKG Interpretation  Date/Time:  Friday October 17 2020 17:58:02 EDT Ventricular Rate:  64 PR Interval:  166 QRS Duration: 108 QT Interval:  434 QTC Calculation: 448 R Axis:   44 Text Interpretation: Sinus rhythm Low voltage, precordial leads Confirmed by Octaviano Glow [phone]) on 10/17/2020 7:19:16 PM  Radiology DG Chest 2 View  Result Date: 10/17/2020 CLINICAL DATA:  A 75 year old male with chest pain that radiates into LEFT arm, history of coronary artery disease. EXAM: CHEST - 2 VIEW COMPARISON:  July 29, 2020 FINDINGS: EKG leads project over the chest. No sign of effusion. No pneumothorax. No lobar consolidation. Mediastinal contours and hilar structures are stable. On limited assessment there is no acute skeletal process. Signs of prior cervical spinal fusion are partially evaluated in the lower cervical spine. IMPRESSION: No acute cardiopulmonary disease. Electronically Signed   By: Zetta Bills M.D.   On: 10/17/2020 19:24    Procedures Procedures   Medications Ordered in  ED Medications  cyclobenzaprine (FLEXERIL) tablet 10 mg (10 mg Oral Given 10/17/20 1904)    ED Course  I have reviewed the triage vital signs and the nursing notes.  Pertinent labs & imaging results that were available during my care of the patient were reviewed by me and considered in my medical decision making (see chart for details).    MDM Rules/Calculators/A&P                          Initial impression-patient presents with chest pain, he is alert, does not appear in distress, vital signs reassuring.  Will obtain basic lab work-up, provide patient with Flexeril and reassess.  Work-up-CBC is unremarkable, CMP shows slight hyperglycemia 135 calcium 8.3, first troponin is 8, second troponin is 8.  Chest x-ray is negative for acute findings.  EKG sinus without signs of ischemia.  Reassessment-reassess after Flexeril, patient states he is feeling much better, has no complaints this time, vital signs remained stable.  will continue to monitor.  Patient is reassessed, states he has slight pain but is ready go home, I explained that we can continue to work him up, but he states he would rather go home.  I advised that he needs to go to his cardiologist for further evaluation and/or come back if symptoms worsen.  Rule out- I have low suspicion for ACS as history is atypical, patient has no cardiac history, EKG was sinus rhythm without signs of ischemia, patient had a delta troponin.  Low suspicion for PE as patient denies pleuritic chest pain, shortness of breath, patient denies leg pain, no pedal edema noted on exam, vital signs are reassuring, presentation is atypical of etiology.  Low suspicion for AAA or aortic dissection as history is atypical, patient has low risk factors.  Low suspicion for systemic infection as patient is nontoxic-appearing, vital signs reassuring, no obvious source infection noted on exam.   Plan-  Chest pain improved-unclear etiology, will recommend that he continues  on home medication as prescribed, and he follows up with his cardiologist next Friday.  Advised him to come back if symptoms worsen.  Vital signs have remained stable, no indication for hospital admission.  Patient discussed with attending and they agreed with assessment and plan.  Patient given at home care as well strict return precautions.  Patient verbalized that they understood agreed to said plan.  Final Clinical Impression(s) / ED Diagnoses Final diagnoses:  Atypical chest pain    Rx / DC Orders ED Discharge Orders     None        Marcello Fennel, PA-C 10/17/20 2152    Marcello Fennel, PA-C 10/17/20 2203    Wyvonnia Dusky, MD 10/18/20 [id number]

## 2020-10-17 NOTE — Telephone Encounter (Signed)
Pt c/o of Chest Pain: STAT if CP now or developed within 24 hours  1. Are you having CP right now? no  2. Are you experiencing any other symptoms (ex. SOB, nausea, vomiting, sweating)? SOB  3. How long have you been experiencing CP? The last few days   4. Is your CP continuous or coming and going? Intermittent   5. Have you taken Nitroglycerin? no ?

## 2020-10-17 NOTE — Telephone Encounter (Signed)
Called patient, spoke to him and daughter- they stated that last night he woke up and had the worst pain from his back up and into this chest. He states that he did not tell his daughter about this morning, but he felt that he was going die. He states it did ease up, but today he states his chest feels sore, he is more SOB than normal, he does have swelling (but he always has this). He did not take a nitroglycerin, and he has had no changes to his medications. They state his BP has been low- but when they checked it now it was 153/92 HR 76. Daughter really would like patient seen but I advised I did not have anything for today- and they checked with his PCP but they did not have anything either, and she wanted him seen. I did advise him to go to to urgent care to be evaluated, daughter states she will do this if she can get him to go.  I advised I would route to MD to review. Patient has upcoming appointment next week.

## 2020-10-17 NOTE — ED Triage Notes (Signed)
Pt went to urgent care today for cp.   ASA and two nitro with relief.

## 2020-10-20 ENCOUNTER — Other Ambulatory Visit: Payer: Self-pay

## 2020-10-20 ENCOUNTER — Encounter: Payer: Self-pay | Admitting: Primary Care

## 2020-10-20 ENCOUNTER — Ambulatory Visit (INDEPENDENT_AMBULATORY_CARE_PROVIDER_SITE_OTHER): Payer: PPO | Admitting: Primary Care

## 2020-10-20 VITALS — BP 138/84 | HR 71 | Temp 98.4°F | Ht 67.0 in | Wt 274.6 lb

## 2020-10-20 DIAGNOSIS — G4733 Obstructive sleep apnea (adult) (pediatric): Secondary | ICD-10-CM

## 2020-10-20 DIAGNOSIS — J9611 Chronic respiratory failure with hypoxia: Secondary | ICD-10-CM

## 2020-10-20 DIAGNOSIS — J449 Chronic obstructive pulmonary disease, unspecified: Secondary | ICD-10-CM | POA: Diagnosis not present

## 2020-10-20 DIAGNOSIS — J441 Chronic obstructive pulmonary disease with (acute) exacerbation: Secondary | ICD-10-CM | POA: Diagnosis not present

## 2020-10-20 DIAGNOSIS — Z9989 Dependence on other enabling machines and devices: Secondary | ICD-10-CM

## 2020-10-20 NOTE — Assessment & Plan Note (Signed)
-  Walked on RA x 1 lap = approx 264f at slow paced, desaturted to 87%. He qualified for POC 2L. Sending in DME order for new oxygen start/POC.

## 2020-10-20 NOTE — Assessment & Plan Note (Signed)
-  Patient is 90% compliant with BIPAP >4 hours - Pressure 15/11, Residual AHI 3.6 - No changes today - Continue to encourage weight loss efforts, advised against driving if experiencing excessive daytime sleepiness - FU in 3-4 months with Dr. Elsworth Soho

## 2020-10-20 NOTE — Progress Notes (Signed)
_0  ID: Kenneth Fuller, male    DOB: Nov 18, 1945, 75 y.o.   MRN: [id number]  Chief Complaint  Patient presents with   Follow-up    Patient reports that he needs a follow up for his machine.     Referring provider: [name], [name], MD  HPI: 75 year old remote smoker presents to establish care for asthma/ COPD and OSA PMH - chronic diastolic heart failure coronary artery disease diabetes, chronic stage III kidney disease. Patient of Dr. Elsworth Soho, last seen in July 2022.   Previous [facility] pulmonary encounter: 08/13/20- Dr. Elsworth Soho  He was treated for pneumonia in 05/2020.  He is maintained on Breztri. He has been maintained on CPAP for many years, recently underwent ONO 05/09/20 - Patient spent 16 mins with SpO2 < 89. Lowest O2 72%, mean 91.5%.this was off CPAP & on RA  a repeat titration study  was ordered and was changed to BiPAP 15/11, no oxygen was required He feels much more comfortable and is resting better with BiPAP.  He smoked less than 10 pack years before he quit in 1971.  He reports lifelong history of asthma.  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  He was seen 6/21 by his PCP and given a prednisone taper for COPD exacerbation.  He started 60 mg and is now down to 20 mg in the third week   10/20/2020- Interim hx  Patient presents today for 2-3 month follow-up for COPD with asthma and OSA. He is doing well, feels BIPAP is working well for him. States that he has an easier time breathing with BIPAP vs CPAP. He was at a church camp meeting for several days in July and went without his CPAP. He noticed a significant difference in sleep quality without his BIPAP. He is maintained on Breztri for hx Asthma/COPD. He experiences moderate amount of dyspnea with minimal exertion. He wants to do activities but he gets out of breath quickly. He also reports sweating easily during the day. He is wanting to be re-assessed for oxygen today. He was  previously on oxygen but returned this and is looking to get POC. ONO 05/09/20 showed that he spent 16 mins with SpO2 <89% off CPAP. He underwent PAP titration and did not require oxygen while on BIPAP. He was recently seen in ED on 10/17/20 for atypical chest pain, trop x 2 were negative. EKG showed sinus rhythm, low voltage. He left before he could be admitted. He has an appointment with cardiology later this week. He is no longer experiencing any significant chest discomfort.   Airview download 09/16/20-10/15/20 Usage 28/90 days (93%) : 27 days (90%) > 4 hours Average usage days used 7 hours 19 mins Pressure IPAP 15 / EPAP 11cm h20 AHI 3.6   Significant tests/ events reviewed  NPSG 06/2020 >> BiPAP 15/11 ,med FF mask  12/2016 PFT >> ratio 84, severe restriction with reduced diffusion capacity.  Total lung capacity 72% FVC, 1.6 L / 40% and DLCO of 19.4/68%.   PFT 1/ 2019 showed FEV1 45%, ratio 68, FVC 48% consistent with moderate to severe airflow obstruction   Feno 01/26/2017 ->  8ppb     High resolution CT chest 01/2017 neg for ILD  CT angiogram chest 05/2020 clear lungs CT sinus March 09, 2017 clear sinuses   Previous sleep study showed very severe OSA, AHI >100. Split night study showed optimal control with CPAP 5-20  Allergies  Allergen Reactions   Amlodipine Besy-Benazepril Hcl Swelling and Other (See Comments)    Makes tongue swell (lotrel)   Hydrocodone Itching    Can tolerate in low doses   Oxycodone Itching   Phenergan [Promethazine Hcl] Other (See Comments)    "I can't remember."    Immunization History  Administered Date(s) Administered   Fluad Quad(high Dose 65+) 11/15/2018   Influenza Whole 10/09/2008   Influenza, High Dose Seasonal PF 11/29/2016, 11/08/2017   Influenza,inj,Quad PF,6+ Mos 12/04/2013, 12/04/2014, 12/23/2015   Pneumococcal Conjugate-13 12/04/2013   Pneumococcal Polysaccharide-23 08/26/2011   Td 11/08/2017    Past Medical History:  Diagnosis  Date   Allergy    Asthma    CAD (coronary artery disease)    CHF (congestive heart failure) (HCC)    COPD (chronic obstructive pulmonary disease) ([place])    Diabetes mellitus    Fibromyalgia    GERD (gastroesophageal reflux disease)    GI bleeding    Gout    Hyperlipidemia    Hypertension    Hypogonadism male    Insomnia    MRSA cellulitis    Neuropathy    Obesity    Shortness of breath dyspnea    with exertion    Sleep apnea    cpap- 14    Wheezing    no asthma diagnosis    Tobacco History: Social History   Tobacco Use  Smoking Status Former   Packs/day: 1.00   Types: Cigarettes   Start date: 02/09/1964   Quit date: 02/08/1969   Years since quitting: 51.7  Smokeless Tobacco Former   Quit date: 1971   Counseling given: Not Answered   Outpatient Medications Prior to Visit  Medication Sig Dispense Refill   albuterol (PROVENTIL) (2.5 MG/3ML) 0.083% nebulizer solution Take 3 mLs (2.5 mg total) by nebulization every 6 (six) hours as needed for wheezing or shortness of breath. 150 mL 1   albuterol (VENTOLIN HFA) 108 (90 Base) MCG/ACT inhaler Inhale 2 puffs into the lungs every 6 (six) hours as needed for wheezing or shortness of breath. 1 each 2   amitriptyline (ELAVIL) 25 MG tablet Take 25 mg by mouth at bedtime.     atorvastatin (LIPITOR) 40 MG tablet Take 40 mg by mouth daily. 1 Tablet Daily     bisoprolol (ZEBETA) 5 MG tablet Take 1/2 (one-half) tablet by mouth once daily (Patient taking differently: Take 2.5 mg by mouth daily.) 45 tablet 1   Budeson-Glycopyrrol-Formoterol (BREZTRI AEROSPHERE) 160-9-4.8 MCG/ACT AERO Inhale 2 puffs into the lungs in the morning and at bedtime. 10.7 g 4   budesonide-formoterol (SYMBICORT) 160-4.5 MCG/ACT inhaler Inhale into the lungs.     buPROPion (WELLBUTRIN XL) 150 MG 24 hr tablet Take by mouth.     chlorthalidone (HYGROTON) 25 MG tablet Take by mouth.     Cholecalciferol (VITAMIN D3) 125 MCG (5000 UT) TABS Take 5,000 Units by mouth  daily.     clonazePAM (KLONOPIN) 0.5 MG tablet Take by mouth.     cloNIDine (CATAPRES) 0.2 MG tablet TAKE 1 TABLET BY MOUTH THREE TIMES DAILY 90 tablet 0   colchicine 0.6 MG tablet Take 1 tablet by mouth once daily 20 tablet 0   cycloSPORINE (RESTASIS) 0.05 % ophthalmic emulsion Apply to eye.     dapagliflozin propanediol (FARXIGA) 5 MG TABS tablet Take 1 tablet (5 mg total) by mouth daily. 90 tablet 3   famotidine (PEPCID) 20 MG tablet Take 1 tablet (20 mg total) by mouth at bedtime.  30 tablet 3   FEROSUL 325 (65 Fe) MG tablet Take 325 mg by mouth 3 (three) times daily.     furosemide (LASIX) 40 MG tablet Take 40 mg by mouth daily as needed (fluid retention.).     glimepiride (AMARYL) 4 MG tablet Take by mouth.     hydrALAZINE (APRESOLINE) 25 MG tablet Take 3 tablets (75 mg total) by mouth 3 (three) times daily. (Patient taking differently: Take 25 mg by mouth 3 (three) times daily.) 270 tablet 0   hydrALAZINE (APRESOLINE) 50 MG tablet Take 50 mg by mouth 3 (three) times daily.     isosorbide dinitrate (ISORDIL) 20 MG tablet Take 1 tablet (20 mg total) by mouth 3 (three) times daily. 270 tablet 3   lisinopril (ZESTRIL) 20 MG tablet Take 1 tablet by mouth daily.     metFORMIN (GLUCOPHAGE) 1000 MG tablet Take by mouth.     MYRBETRIQ 25 MG TB24 tablet Take 1 tablet by mouth once daily 90 tablet 1   nitroGLYCERIN (NITROSTAT) 0.4 MG SL tablet Place under the tongue.     omeprazole (PRILOSEC) 40 MG capsule TAKE 1 CAPSULE BY MOUTH ONCE DAILY 30 TO 60 MINUTES PRIOR TO BREAKFAST (Patient taking differently: Take 40 mg by mouth daily.) 30 capsule 11   ondansetron (ZOFRAN) 4 MG tablet Take by mouth.     ONETOUCH ULTRA test strip USE 1 STRIP TO CHECK GLUCOSE ONCE DAILY 100 each 0   polyethylene glycol (MIRALAX / GLYCOLAX) 17 g packet Take 17 g by mouth as needed for mild constipation.     RESTASIS 0.05 % ophthalmic emulsion Place 1 drop into both eyes in the morning and at bedtime.     Semaglutide,0.25 or  0.5MG/DOS, (OZEMPIC, 0.25 OR 0.5 MG/DOSE,) 2 MG/1.5ML SOPN Inject 0.25 mg into the skin once a week.     sertraline (ZOLOFT) 100 MG tablet Take 1 tablet (100 mg total) by mouth daily. 90 tablet 3   sertraline (ZOLOFT) 100 MG tablet Take 1 tablet by mouth daily.     spironolactone (ALDACTONE) 25 MG tablet Take by mouth.     tamsulosin (FLOMAX) 0.4 MG CAPS capsule Take 1 capsule (0.4 mg total) by mouth daily after supper. 90 capsule 3   nitroGLYCERIN (NITROSTAT) 0.4 MG SL tablet Place 1 tablet (0.4 mg total) under the tongue every 5 (five) minutes x 3 doses as needed for chest pain. 30 tablet 12   No facility-administered medications prior to visit.    Review of Systems  Review of Systems  Constitutional: Negative.   HENT: Negative.    Respiratory:  Negative for cough, chest tightness and wheezing.        DOE  Cardiovascular: Negative.     Physical Exam  BP 138/84 (BP Location: Left Arm, Patient Position: Sitting, Cuff Size: Normal)   Pulse 71   Temp 98.4 F (36.9 C) (Oral)   Ht _0  (1.702 m)   Wt 274 lb 9.6 oz (124.6 kg)   SpO2 96%   BMI 43.01 kg/m  Physical Exam Constitutional:      Appearance: Normal appearance.  HENT:     Head: Normocephalic and atraumatic.     Mouth/Throat:     Mouth: Mucous membranes are moist.     Pharynx: Oropharynx is clear.  Cardiovascular:     Rate and Rhythm: Normal rate and regular rhythm.  Pulmonary:     Effort: Pulmonary effort is normal.     Breath sounds: Normal breath sounds. No wheezing,  rhonchi or rales.     Comments: O2 96% RA at rest Musculoskeletal:        General: Normal range of motion.  Skin:    General: Skin is warm and dry.  Neurological:     Mental Status: He is alert.  Psychiatric:        Mood and Affect: Mood normal.        Behavior: Behavior normal.        Thought Content: Thought content normal.        Judgment: Judgment normal.     Lab Results:  CBC    Component Value Date/Time   WBC 7.5 10/17/2020 1842    RBC 4.76 10/17/2020 1842   HGB 14.0 10/17/2020 1842   HGB 16.3 07/14/2020 1048   HCT 43.5 10/17/2020 1842   HCT 48.5 07/14/2020 1048   PLT 211 10/17/2020 1842   PLT 250 07/14/2020 1048   MCV 91.4 10/17/2020 1842   MCV 86 07/14/2020 1048   MCH 29.4 10/17/2020 1842   MCHC 32.2 10/17/2020 1842   RDW 13.9 10/17/2020 1842   RDW 15.1 07/14/2020 1048   LYMPHSABS 1.7 10/17/2020 1842   LYMPHSABS 2.3 07/14/2020 1048   MONOABS 0.8 10/17/2020 1842   EOSABS 0.2 10/17/2020 1842   EOSABS 0.1 07/14/2020 1048   BASOSABS 0.1 10/17/2020 1842   BASOSABS 0.1 07/14/2020 1048    BMET    Component Value Date/Time   NA 136 10/17/2020 1842   NA 139 07/14/2020 1048   K 3.9 10/17/2020 1842   CL 102 10/17/2020 1842   CO2 27 10/17/2020 1842   GLUCOSE 135 (H) 10/17/2020 1842   BUN 16 10/17/2020 1842   BUN 31 (H) 07/14/2020 1048   CREATININE 1.13 10/17/2020 1842   CREATININE 1.04 06/12/2012 1232   CALCIUM 8.3 (L) 10/17/2020 1842   GFRNONAA >60 10/17/2020 1842   GFRNONAA 74 06/12/2012 1232   GFRAA >60 11/01/2019 2121   GFRAA 86 06/12/2012 1232    BNP    Component Value Date/Time   BNP 279.0 (H) 06/06/2020 1500    ProBNP    Component Value Date/Time   PROBNP 219.0 (H) 05/07/2020 1226    Imaging: DG Chest 2 View  Result Date: 10/17/2020 CLINICAL DATA:  A 75 year old male with chest pain that radiates into LEFT arm, history of coronary artery disease. EXAM: CHEST - 2 VIEW COMPARISON:  July 29, 2020 FINDINGS: EKG leads project over the chest. No sign of effusion. No pneumothorax. No lobar consolidation. Mediastinal contours and hilar structures are stable. On limited assessment there is no acute skeletal process. Signs of prior cervical spinal fusion are partially evaluated in the lower cervical spine. IMPRESSION: No acute cardiopulmonary disease. Electronically Signed   By: Zetta Bills M.D.   On: 10/17/2020 19:24     Assessment & Plan:   OSA on CPAP - Patient is 90% compliant with  BIPAP >4 hours - Pressure 15/11, Residual AHI 3.6 - No changes today - Continue to encourage weight loss efforts, advised against driving if experiencing excessive daytime sleepiness - FU in 3-4 months with Dr. Elsworth Soho    Chronic obstructive pulmonary disease ([place]) - Continues to have moderate dyspnea with exertion. No acute respiratory symptoms. He is compliant with [employer]. Recommend adding spacer with HFA and referring patient to pulmonary rehab   Chronic respiratory failure with hypoxia ([place]) - Walked on RA x 1 lap = approx 213f at slow paced, desaturted to 87%. He qualified for POC 2L. Sending  in DME order for new oxygen start/POC.     Martyn Ehrich, NP 10/20/2020

## 2020-10-20 NOTE — Patient Instructions (Signed)
Nice seeing you today Kenneth Fuller  Recommendations: - Continue to wear BIPAP every night for 4-6 hours or longer - Continue Breztri take two puffs morning and evening (use with spacer)  Orders: - Simple walk test on room air, if <88% place on 2L POC   Referral: - Pulmonary rehab (please order, COPD)  Follow-up: - 3-4 months with Dr. Elsworth Soho

## 2020-10-20 NOTE — Assessment & Plan Note (Addendum)
-  Continues to have moderate dyspnea with exertion. No acute respiratory symptoms. He is compliant with [employer]. Recommend adding spacer with HFA and referring patient to pulmonary rehab

## 2020-10-20 NOTE — Addendum Note (Signed)
Addended by: Dessie Coma on: 10/20/2020 11:56 AM   Modules accepted: Orders

## 2020-10-21 ENCOUNTER — Telehealth (HOSPITAL_COMMUNITY): Payer: Self-pay

## 2020-10-21 NOTE — Telephone Encounter (Signed)
Placed referral to urology for urinary symptoms

## 2020-10-21 NOTE — Telephone Encounter (Signed)
Patient referred to Pulmonary Rehab with COPD with acute exacerbation and COPD unspecialized type. Called patient and spoke with him about our program. He stated he has an elliptical at home and he wanted to try to exercise using this on his own. I discussed with patient the benefits of doing our program verses he doing it on his own at home. He states he wants to try exercising at home first and if he feels he needs to do pulmonary rehab, he will call. Advised patient to monitor his O2 saturation while exercising and to use his oxygen as prescribed if his O2 drops below 88%. He verbalized understanding. Cancelling referral.

## 2020-10-22 NOTE — Telephone Encounter (Signed)
Pt made aware. Knows to call if he does not have an appt scheduled in 7 business days.

## 2020-10-24 ENCOUNTER — Telehealth: Payer: Self-pay | Admitting: Family Medicine

## 2020-10-24 ENCOUNTER — Encounter: Payer: Self-pay | Admitting: Cardiovascular Disease

## 2020-10-24 ENCOUNTER — Ambulatory Visit: Payer: PPO | Admitting: Cardiovascular Disease

## 2020-10-24 ENCOUNTER — Other Ambulatory Visit: Payer: Self-pay

## 2020-10-24 VITALS — BP 152/102 | HR 73 | Ht 67.0 in | Wt 276.0 lb

## 2020-10-24 DIAGNOSIS — I2510 Atherosclerotic heart disease of native coronary artery without angina pectoris: Secondary | ICD-10-CM

## 2020-10-24 DIAGNOSIS — E1169 Type 2 diabetes mellitus with other specified complication: Secondary | ICD-10-CM | POA: Diagnosis not present

## 2020-10-24 DIAGNOSIS — E669 Obesity, unspecified: Secondary | ICD-10-CM | POA: Diagnosis not present

## 2020-10-24 DIAGNOSIS — J449 Chronic obstructive pulmonary disease, unspecified: Secondary | ICD-10-CM

## 2020-10-24 DIAGNOSIS — I5042 Chronic combined systolic (congestive) and diastolic (congestive) heart failure: Secondary | ICD-10-CM

## 2020-10-24 DIAGNOSIS — I10 Essential (primary) hypertension: Secondary | ICD-10-CM | POA: Diagnosis not present

## 2020-10-24 DIAGNOSIS — E785 Hyperlipidemia, unspecified: Secondary | ICD-10-CM

## 2020-10-24 DIAGNOSIS — N182 Chronic kidney disease, stage 2 (mild): Secondary | ICD-10-CM

## 2020-10-24 DIAGNOSIS — G4733 Obstructive sleep apnea (adult) (pediatric): Secondary | ICD-10-CM

## 2020-10-24 DIAGNOSIS — Z9989 Dependence on other enabling machines and devices: Secondary | ICD-10-CM | POA: Diagnosis not present

## 2020-10-24 NOTE — Progress Notes (Signed)
Cardiology Office Note    Date:  10/27/2020   ID:  Kenneth Fuller, DOB Jul 03, 1945, MRN [id number]  PCP:  [name], [name], MD  Cardiologist:   Sanda Klein, MD   No chief complaint on file.   History of Present Illness:  Kenneth Fuller is a 75 y.o. male with chronic diastolic heart failure, morbid obesity, obstructive sleep apnea, hyperlipidemia, diabetes mellitus complicated by neuropathy, minor nonobstructive coronary atherosclerosis, moderately severe restrictive lung disease.  He has a history of angioedema with ACE inhibitors.  He was seen in the emergency room on 10/17/2020 with shortness of breath.  His blood pressure was elevated.  Labs are generally normal.  Troponin was very low.  WBC count was normal.  The chest x-ray did not show any acute changes.  The ECG was normal, and sinus rhythm (low voltage is likely due to obesity).  He has not had any chest tightness.  His weight is close to his baseline.  He does not currently have edema.  He becomes short of breath walking on level ground for 50 feet or so.  He avoids climbing stairs.  He is not currently taking any loop diuretics, but is on a thiazide as well as [place].  He has not had dizziness or syncope.  In the past, his episodes of heart failure often manifested as chest tightness that resolved with diuresis.  His typical "dry weight is around 270 pounds.  He was just slightly more than that today.  Baseline BNP appears to be around 170. He had a fairly recent coronary angiogram in October 2018 that showed a maximum stenosis of 10% in the mid LAD and mid RCA.  His echocardiogram showed evidence of normal left ventricular systolic function (EF 49-82%) and diastolic dysfunction with indeterminate filling pressures (E/e'=12-13).  He was hospitalized for severe shortness of breath from November 27 through January 09, 2018.   On admission his BNP was slightly elevated at 330 (previous baseline 72.6 in 2017).  An echo  during that hospitalization which was a mediocre quality study but showed an ejection fraction decreased at 45-50%.  During his hospitalization fo CHF in August 2020 the BNP was elevated to 176. He diuresed 10 liters.  He underwent cardiac catheterization in October 2018 he has preserved left ventricular systolic function and minimal coronary artery disease.  LVEDP was borderline at the time of catheterization (10-14 mmHg).  His echo did show grade 2 diastolic dysfunction.  However pulmonary function test had shown fairly severe obstructive lung disease with FEV1 in the 40-45% of predicted range.  He did not have much improvement with bronchodilators either on the PFTs or clinically.  He does have a history of roughly 5 pack years of smoking, but quit 40 years ago.  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  Chest CT did not show evidence of severe interstitial lung disease.    Past Medical History:  Diagnosis Date   Allergy    Asthma    CAD (coronary artery disease)    CHF (congestive heart failure) (HCC)    COPD (chronic obstructive pulmonary disease) (HCC)    Diabetes mellitus    Fibromyalgia    GERD (gastroesophageal reflux disease)    GI bleeding    Gout    Hyperlipidemia    Hypertension    Hypogonadism male    Insomnia    MRSA cellulitis    Neuropathy    Obesity  Shortness of breath dyspnea    with exertion    Sleep apnea    cpap- 14    Wheezing    no asthma diagnosis    Past Surgical History:  Procedure Laterality Date   BACK SURGERY     BIOPSY  12/20/2019   Procedure: BIOPSY;  Surgeon: Milus Banister, MD;  Location: [facility];  Service: Endoscopy;;   CARDIAC CATHETERIZATION     COLONOSCOPY     COLONOSCOPY WITH PROPOFOL N/A 12/20/2019   Procedure: COLONOSCOPY WITH PROPOFOL;  Surgeon: Milus Banister, MD;  Location: [facility];  Service: Endoscopy;  Laterality: N/A;   ESOPHAGOGASTRODUODENOSCOPY (EGD) WITH PROPOFOL N/A  12/20/2019   Procedure: ESOPHAGOGASTRODUODENOSCOPY (EGD) WITH PROPOFOL;  Surgeon: Milus Banister, MD;  Location: [facility];  Service: Endoscopy;  Laterality: N/A;   LEFT HEART CATH AND CORONARY ANGIOGRAPHY N/A 12/02/2016   Procedure: LEFT HEART CATH AND CORONARY ANGIOGRAPHY;  Surgeon: Jettie Booze, MD;  Location: [facility];  Service: Cardiovascular;  Laterality: N/A;   LUMBAR LAMINECTOMY/DECOMPRESSION MICRODISCECTOMY N/A 01/02/2014   Procedure: CENTRAL DECOMPRESSION LUMBAR LAMINECTOMY L3-L4, L4-L5;  Surgeon: Tobi Bastos, MD;  Location: [facility];  Service: Orthopedics;  Laterality: N/A;   neck fusion     POLYPECTOMY  12/20/2019   Procedure: POLYPECTOMY;  Surgeon: Milus Banister, MD;  Location: [facility];  Service: Endoscopy;;    Current Medications: Outpatient Medications Prior to Visit  Medication Sig Dispense Refill   albuterol (PROVENTIL) (2.5 MG/3ML) 0.083% nebulizer solution Take 3 mLs (2.5 mg total) by nebulization every 6 (six) hours as needed for wheezing or shortness of breath. 150 mL 1   albuterol (VENTOLIN HFA) 108 (90 Base) MCG/ACT inhaler Inhale 2 puffs into the lungs every 6 (six) hours as needed for wheezing or shortness of breath. 1 each 2   amitriptyline (ELAVIL) 25 MG tablet Take 25 mg by mouth at bedtime.     atorvastatin (LIPITOR) 40 MG tablet Take 40 mg by mouth daily. 1 Tablet Daily     bisoprolol (ZEBETA) 5 MG tablet Take 1/2 (one-half) tablet by mouth once daily (Patient taking differently: Take 2.5 mg by mouth daily.) 45 tablet 1   Budeson-Glycopyrrol-Formoterol (BREZTRI AEROSPHERE) 160-9-4.8 MCG/ACT AERO Inhale 2 puffs into the lungs in the morning and at bedtime. 10.7 g 4   budesonide-formoterol (SYMBICORT) 160-4.5 MCG/ACT inhaler Inhale into the lungs.     chlorthalidone (HYGROTON) 25 MG tablet Take by mouth.     Cholecalciferol (VITAMIN D3) 125 MCG (5000 UT) TABS Take 5,000 Units by mouth daily.     clonazePAM (KLONOPIN) 0.5 MG tablet  Take by mouth.     colchicine 0.6 MG tablet Take 1 tablet by mouth once daily 20 tablet 0   cycloSPORINE (RESTASIS) 0.05 % ophthalmic emulsion Apply to eye.     dapagliflozin propanediol (FARXIGA) 5 MG TABS tablet Take 1 tablet (5 mg total) by mouth daily. 90 tablet 3   famotidine (PEPCID) 20 MG tablet Take 1 tablet (20 mg total) by mouth at bedtime. 30 tablet 3   FEROSUL 325 (65 Fe) MG tablet Take 325 mg by mouth 3 (three) times daily.     furosemide (LASIX) 40 MG tablet Take 40 mg by mouth daily as needed (fluid retention.).     glimepiride (AMARYL) 4 MG tablet Take by mouth.     hydrALAZINE (APRESOLINE) 50 MG tablet Take 50 mg by mouth 3 (three) times daily.     isosorbide dinitrate (ISORDIL) 20 MG tablet  Take 1 tablet (20 mg total) by mouth 3 (three) times daily. 270 tablet 3   lisinopril (ZESTRIL) 20 MG tablet Take 1 tablet by mouth daily.     MYRBETRIQ 25 MG TB24 tablet Take 1 tablet by mouth once daily 90 tablet 1   nitroGLYCERIN (NITROSTAT) 0.4 MG SL tablet Place under the tongue.     omeprazole (PRILOSEC) 40 MG capsule TAKE 1 CAPSULE BY MOUTH ONCE DAILY 30 TO 60 MINUTES PRIOR TO BREAKFAST (Patient taking differently: Take 40 mg by mouth daily.) 30 capsule 11   ondansetron (ZOFRAN) 4 MG tablet Take by mouth.     ONETOUCH ULTRA test strip USE 1 STRIP TO CHECK GLUCOSE ONCE DAILY 100 each 0   polyethylene glycol (MIRALAX / GLYCOLAX) 17 g packet Take 17 g by mouth as needed for mild constipation.     RESTASIS 0.05 % ophthalmic emulsion Place 1 drop into both eyes in the morning and at bedtime.     sertraline (ZOLOFT) 100 MG tablet Take 1 tablet (100 mg total) by mouth daily. 90 tablet 3   sertraline (ZOLOFT) 100 MG tablet Take 1 tablet by mouth daily.     tamsulosin (FLOMAX) 0.4 MG CAPS capsule Take 1 capsule (0.4 mg total) by mouth daily after supper. 90 capsule 3   buPROPion (WELLBUTRIN XL) 150 MG 24 hr tablet Take by mouth.     hydrALAZINE (APRESOLINE) 25 MG tablet Take 3 tablets (75 mg  total) by mouth 3 (three) times daily. (Patient taking differently: Take 25 mg by mouth 3 (three) times daily.) 270 tablet 0   metFORMIN (GLUCOPHAGE) 1000 MG tablet Take by mouth.     Semaglutide,0.25 or 0.5MG/DOS, (OZEMPIC, 0.25 OR 0.5 MG/DOSE,) 2 MG/1.5ML SOPN Inject 0.25 mg into the skin once a week.     cloNIDine (CATAPRES) 0.2 MG tablet TAKE 1 TABLET BY MOUTH THREE TIMES DAILY (Patient not taking: Reported on 10/24/2020) 90 tablet 0   nitroGLYCERIN (NITROSTAT) 0.4 MG SL tablet Place 1 tablet (0.4 mg total) under the tongue every 5 (five) minutes x 3 doses as needed for chest pain. 30 tablet 12   spironolactone (ALDACTONE) 25 MG tablet Take by mouth. (Patient not taking: Reported on 10/24/2020)     No facility-administered medications prior to visit.     Allergies:   Amlodipine besy-benazepril hcl, Hydrocodone, Oxycodone, and Phenergan [promethazine hcl]   Social History   Socioeconomic History   Marital status: Married    Spouse name: Butch Penny   Number of children: 3   Years of education: 8   Highest education level: GED or equivalent  Occupational History   Occupation: retired/disability 1999    Employer: DISABLED    Comment: Textiles  Tobacco Use   Smoking status: Former    Packs/day: 1.00    Types: Cigarettes    Start date: 02/09/1964    Quit date: 02/08/1969    Years since quitting: 51.7   Smokeless tobacco: Former    Quit date: 1971  Scientific laboratory technician Use: Never used  Substance and Sexual Activity   Alcohol use: No    Alcohol/week: 0.0 standard drinks   Drug use: No   Sexual activity: Not Currently  Other Topics Concern   Not on file  Social History Narrative   Drinks caffeine tea occasionally   Lives with wife Butch Penny, children live nearby - [place] and Jeneen Rinks   One son, Heath Lark passed away last year suddenly   Social Determinants of Radio broadcast assistant  Strain: Low Risk    Difficulty of Paying Living Expenses: Not very hard  Food Insecurity: No Food Insecurity    Worried About Charity fundraiser in the Last Year: Never true   Ran Out of Food in the Last Year: Never true  Transportation Needs: No Transportation Needs   Lack of Transportation (Medical): No   Lack of Transportation (Non-Medical): No  Physical Activity: Inactive   Days of Exercise per Week: 0 days   Minutes of Exercise per Session: 0 min  Stress: No Stress Concern Present   Feeling of Stress : Not at all  Social Connections: Socially Integrated   Frequency of Communication with Friends and Family: More than three times a week   Frequency of Social Gatherings with Friends and Family: More than three times a week   Attends Religious Services: More than 4 times per year   Active Member of [employer] or Organizations: Yes   Attends Music therapist: More than 4 times per year   Marital Status: Married     Family History:  The patient's family history includes Colon cancer in his mother; Deep vein thrombosis in his brother; Diabetes in his father; Drug abuse in his sister; Heart attack in his father; Heart attack (age of onset: 27) in his son; Heart disease in his brother, brother, and father; Heart failure in his sister; Kidney disease in his sister.   ROS:   Please see the history of present illness.    ROS all other symptoms are reviewed and are negative  PHYSICAL EXAM:   VS:  BP (!) 152/102   Pulse 73   Ht 5' 7" (1.702 m)   Wt 276 lb (125.2 kg)   SpO2 95%   BMI 43.23 kg/m       General: Alert, oriented x3, no distress, morbid obesity Head: no evidence of trauma, PERRL, EOMI, no exophtalmos or lid lag, no myxedema, no xanthelasma; normal ears, nose and oropharynx Neck: normal jugular venous pulsations and no hepatojugular reflux; brisk carotid pulses without delay and no carotid bruits Chest: clear to auscultation, no signs of consolidation by percussion or palpation, normal fremitus, symmetrical and full respiratory excursions Cardiovascular: normal position and  quality of the apical impulse, regular rhythm, normal first and second heart sounds, no murmurs, rubs or gallops Abdomen: no tenderness or distention, no masses by palpation, no abnormal pulsatility or arterial bruits, normal bowel sounds, no hepatosplenomegaly Extremities: no clubbing, cyanosis or edema; 2+ radial, ulnar and brachial pulses bilaterally; 2+ right femoral, posterior tibial and dorsalis pedis pulses; 2+ left femoral, posterior tibial and dorsalis pedis pulses; no subclavian or femoral bruits Neurological: grossly nonfocal Psych: Normal mood and affect   Wt Readings from Last 3 Encounters:  10/24/20 276 lb (125.2 kg)  10/20/20 274 lb 9.6 oz (124.6 kg)  10/17/20 265 lb (120.2 kg)      Studies/Labs Reviewed:   EKG:  EKG is not ordered today.  Personally reviewed the tracing from 10/17/2020 which shows normal sinus rhythm and slightly low voltage, like due to obesity. Recent Labs: 05/07/2020: Pro B Natriuretic peptide (BNP) 219.0 07/10/2020: Magnesium 2.4 10/17/2020: ALT 29; Hemoglobin 14.0; Platelets 211 10/24/2020: BNP 66.1; BUN 18; Creatinine, Ser 1.12; Potassium 4.8; Sodium 139   Lipid Panel    Component Value Date/Time   CHOL 90 (L) 10/04/2019 1349   CHOL 111 06/12/2012 1232   TRIG 142 10/04/2019 1349   TRIG 141 01/24/2014 1143   TRIG 192 (H) 06/12/2012 1232  HDL 40 10/04/2019 1349   HDL 38 (L) 01/24/2014 1143   HDL 33 (L) 06/12/2012 1232   CHOLHDL 2.3 10/04/2019 1349   CHOLHDL 4.8 11/22/2006 1712   VLDL 39 11/22/2006 1712   LDLCALC 26 10/04/2019 1349   LDLCALC 27 10/25/2013 1224   LDLCALC 40 06/12/2012 1232    10/04/2019 hemoglobin A1c 5.8% 07/09/2020 hemoglobin A1c 7.9%  ASSESSMENT:    1. Chronic combined systolic and diastolic congestive heart failure ([place])   2. Chronic obstructive pulmonary disease, unspecified COPD type ([place])   3. OSA on CPAP   4. Essential hypertension   5. Coronary artery disease involving native coronary artery of native heart  without angina pectoris   6. Dyslipidemia (high LDL; low HDL)   7. Diabetes mellitus type 2 in obese ([place])   8. CKD (chronic kidney disease) stage 2, GFR 60-89 ml/min   9. Morbid obesity ([place])      PLAN:  In order of problems listed above:  CHF: His physical exam does not suggest hypervolemia, but is difficult due to morbid obesity.  His weight is close to our usual target "dry weight".  We will check a BMP to make sure, but I do not think his current problems shortness of breath are related to heart failure.  COPD: Chronic lung disease remains by far his most serious functional limitation.  He had markedly abnormal PFTs in the past, FEV1 40-45% of normal.  Prefer to use bisoprolol since it is the most selective beta-blocker. OSA: Reports compliance with CPAP.  Does not have daytime hypersomnolence. HTN: Blood pressure is markedly elevated today but this is unusual.  Just a couple of days ago his blood pressure was 138/84.  His blood pressure was decreasing throughout the interview and his diastolic blood pressure was down to 94 mmHg towards the end of the visit.  I did not make any changes to his medication but asked him to keep a close eye on his blood pressure with his home monitor. CAD: His episodes of chest pain in the past have resolved with diuresis.  He does not currently have any chest pain.  He had minimal CAD at catheterization.  Repeat coronary angiography in October 2018 showed only minor nonobstructive atherosclerosis, no progression from 2011 HLP: Excellent LDL cholesterol level DM: Has had some deterioration in his hemoglobin A1c since last year, now up to 7.9% but still not bad.  [name] is helping with heart failure and diabetes both. CKD: Stable creatinine at about 1.1 baseline. Morbid obesity we have discussed many times that weight loss would be beneficial for his heart and lungs.     Medication Adjustments/Labs and Tests Ordered: Current medicines are reviewed at length  with the patient today.  Concerns regarding medicines are outlined above.  Medication changes, Labs and Tests ordered today are listed in the Patient Instructions below. Patient Instructions  Medication Instructions:  No changes *If you need a refill on your cardiac medications before your next appointment, please call your pharmacy*   Lab Work: Your provider would like for you to have the following labs today: BNP and BMET  If you have labs (blood work) drawn today and your tests are completely normal, you will receive your results only by: [place] (if you have MyChart) OR A paper copy in the mail If you have any lab test that is abnormal or we need to change your treatment, we will call you to review the results.   Testing/Procedures: None ordered  Follow-Up: At [facility], you and your health needs are our priority.  As part of our continuing mission to provide you with exceptional heart care, we have created designated Provider Care Teams.  These Care Teams include your primary Cardiologist (physician) and Advanced Practice Providers (APPs -  Physician Assistants and Nurse Practitioners) who all work together to provide you with the care you need, when you need it.  We recommend signing up for the patient portal called "MyChart".  Sign up information is provided on this After Visit Summary.  MyChart is used to connect with patients for Virtual Visits (Telemedicine).  Patients are able to view lab/test results, encounter notes, upcoming appointments, etc.  Non-urgent messages can be sent to your provider as well.   To learn more about what you can do with MyChart, go to [email or web address].    Your next appointment:   6 month(s)  The format for your next appointment:   In Person  Provider:   You may see Sanda Klein, MD or one of the following Advanced Practice Providers on your designated Care Team:   Almyra Deforest, PA-C Fabian Sharp, [place] or  Roby Lofts,  PA-C    Signed, Sanda Klein, MD  10/27/2020 8:53 PM    [address], [place], [place]  [address] Phone: [phone]; Fax: [phone]

## 2020-10-24 NOTE — Patient Instructions (Signed)
Medication Instructions:  No changes *If you need a refill on your cardiac medications before your next appointment, please call your pharmacy*   Lab Work: Your provider would like for you to have the following labs today: BNP and BMET  If you have labs (blood work) drawn today and your tests are completely normal, you will receive your results only by: [place] (if you have MyChart) OR A paper copy in the mail If you have any lab test that is abnormal or we need to change your treatment, we will call you to review the results.   Testing/Procedures: None ordered   Follow-Up: At [facility], you and your health needs are our priority.  As part of our continuing mission to provide you with exceptional heart care, we have created designated Provider Care Teams.  These Care Teams include your primary Cardiologist (physician) and Advanced Practice Providers (APPs -  Physician Assistants and Nurse Practitioners) who all work together to provide you with the care you need, when you need it.  We recommend signing up for the patient portal called "MyChart".  Sign up information is provided on this After Visit Summary.  MyChart is used to connect with patients for Virtual Visits (Telemedicine).  Patients are able to view lab/test results, encounter notes, upcoming appointments, etc.  Non-urgent messages can be sent to your provider as well.   To learn more about what you can do with MyChart, go to [email or web address].    Your next appointment:   6 month(s)  The format for your next appointment:   In Person  Provider:   You may see Sanda Klein, MD or one of the following Advanced Practice Providers on your designated Care Team:   Almyra Deforest, PA-C Fabian Sharp, PA-C or  Roby Lofts, [place]

## 2020-10-25 LAB — BASIC METABOLIC PANEL WITH GFR
BUN/Creatinine Ratio: 16 (ref 10–24)
BUN: 18 mg/dL (ref 8–27)
CO2: 24 mmol/L (ref 20–29)
Calcium: 9.1 mg/dL (ref 8.6–10.2)
Chloride: 99 mmol/L (ref 96–106)
Creatinine, Ser: 1.12 mg/dL (ref 0.76–1.27)
Glucose: 150 mg/dL — ABNORMAL HIGH (ref 65–99)
Potassium: 4.8 mmol/L (ref 3.5–5.2)
Sodium: 139 mmol/L (ref 134–144)
eGFR: 69 mL/min/{1.73_m2}

## 2020-10-25 LAB — BRAIN NATRIURETIC PEPTIDE: BNP: 66.1 pg/mL (ref 0.0–100.0)

## 2020-10-26 ENCOUNTER — Other Ambulatory Visit: Payer: Self-pay | Admitting: Cardiovascular Disease

## 2020-10-26 DIAGNOSIS — I10 Essential (primary) hypertension: Secondary | ICD-10-CM

## 2020-10-27 ENCOUNTER — Ambulatory Visit (INDEPENDENT_AMBULATORY_CARE_PROVIDER_SITE_OTHER): Payer: PPO

## 2020-10-27 ENCOUNTER — Ambulatory Visit (INDEPENDENT_AMBULATORY_CARE_PROVIDER_SITE_OTHER): Payer: PPO | Admitting: Nurse Practitioner

## 2020-10-27 VITALS — BP 199/112 | HR 92 | Temp 99.1°F

## 2020-10-27 DIAGNOSIS — R059 Cough, unspecified: Secondary | ICD-10-CM

## 2020-10-27 DIAGNOSIS — R062 Wheezing: Secondary | ICD-10-CM | POA: Diagnosis not present

## 2020-10-27 DIAGNOSIS — R0602 Shortness of breath: Secondary | ICD-10-CM

## 2020-10-27 MED ORDER — BENZONATATE 100 MG PO CAPS: 200.0000 mg | ORAL_CAPSULE | Freq: Three times a day (TID) | ORAL | 0 refills | Status: DC | PRN

## 2020-10-27 MED ORDER — AMOXICILLIN-POT CLAVULANATE 875-125 MG PO TABS: 1.0000 | ORAL_TABLET | Freq: Two times a day (BID) | ORAL | 0 refills | Status: DC

## 2020-10-27 MED ORDER — PREDNISONE 10 MG (21) PO TBPK: ORAL_TABLET | ORAL | 0 refills | Status: DC

## 2020-10-27 NOTE — Progress Notes (Signed)
Acute Office Visit  Subjective:    Patient ID: Kenneth Fuller, male    DOB: 06/06/1945, 75 y.o.   MRN: [id number]  Chief Complaint  Patient presents with   Cough   Wheezing   Shortness of Breath    Cough This is a recurrent problem. The current episode started in the past 7 days. The problem has been gradually worsening. The problem occurs constantly. The cough is Non-productive. Associated symptoms include headaches, nasal congestion and shortness of breath. Pertinent negatives include no chills or rash. He has tried prescription cough suppressant for the symptoms. His past medical history is significant for asthma and COPD.    Past Medical History:  Diagnosis Date   Allergy    Asthma    CAD (coronary artery disease)    CHF (congestive heart failure) (HCC)    COPD (chronic obstructive pulmonary disease) (HCC)    Diabetes mellitus    Fibromyalgia    GERD (gastroesophageal reflux disease)    GI bleeding    Gout    Hyperlipidemia    Hypertension    Hypogonadism male    Insomnia    MRSA cellulitis    Neuropathy    Obesity    Shortness of breath dyspnea    with exertion    Sleep apnea    cpap- 15    Wheezing    no asthma diagnosis    Past Surgical History:  Procedure Laterality Date   BACK SURGERY     BIOPSY  12/20/2019   Procedure: BIOPSY;  Surgeon: Milus Banister, MD;  Location: [facility];  Service: Endoscopy;;   CARDIAC CATHETERIZATION     COLONOSCOPY     COLONOSCOPY WITH PROPOFOL N/A 12/20/2019   Procedure: COLONOSCOPY WITH PROPOFOL;  Surgeon: Milus Banister, MD;  Location: [facility];  Service: Endoscopy;  Laterality: N/A;   ESOPHAGOGASTRODUODENOSCOPY (EGD) WITH PROPOFOL N/A 12/20/2019   Procedure: ESOPHAGOGASTRODUODENOSCOPY (EGD) WITH PROPOFOL;  Surgeon: Milus Banister, MD;  Location: [facility];  Service: Endoscopy;  Laterality: N/A;   LEFT HEART CATH AND CORONARY ANGIOGRAPHY N/A 12/02/2016   Procedure: LEFT HEART CATH AND CORONARY  ANGIOGRAPHY;  Surgeon: Jettie Booze, MD;  Location: [facility];  Service: Cardiovascular;  Laterality: N/A;   LUMBAR LAMINECTOMY/DECOMPRESSION MICRODISCECTOMY N/A 01/02/2014   Procedure: CENTRAL DECOMPRESSION LUMBAR LAMINECTOMY L3-L4, L4-L5;  Surgeon: Tobi Bastos, MD;  Location: [facility];  Service: Orthopedics;  Laterality: N/A;   neck fusion     POLYPECTOMY  12/20/2019   Procedure: POLYPECTOMY;  Surgeon: Milus Banister, MD;  Location: [facility];  Service: Endoscopy;;    Family History  Problem Relation Age of Onset   Colon cancer Mother    Diabetes Father        siblings   Heart disease Father        brother   Heart attack Father    Kidney disease Sister    Heart failure Sister    Heart disease Brother    Heart attack Son 24   Drug abuse Sister    Heart disease Brother    Deep vein thrombosis Brother    Colon polyps Neg Hx     Social History   Socioeconomic History   Marital status: Married    Spouse name: Butch Penny   Number of children: 3   Years of education: 8   Highest education level: GED or equivalent  Occupational History   Occupation: retired/disability 1999    Employer: DISABLED  Comment: Textiles  Tobacco Use   Smoking status: Former    Packs/day: 1.00    Types: Cigarettes    Start date: 02/09/1964    Quit date: 02/08/1969    Years since quitting: 51.7   Smokeless tobacco: Former    Quit date: 1971  Scientific laboratory technician Use: Never used  Substance and Sexual Activity   Alcohol use: No    Alcohol/week: 0.0 standard drinks   Drug use: No   Sexual activity: Not Currently  Other Topics Concern   Not on file  Social History Narrative   Drinks caffeine tea occasionally   Lives with wife Butch Penny, children live nearby - [place] and [place]ene[place] Rinks   One son, Heath Lark passed away last year suddenly   Social Determinants of Health   Financial Resource Strain: Low Risk    Difficulty of Paying Living Expenses: Not very hard  Food Insecurity: No Food  Insecurity   Worried About Charity fundraiser in the Last Year: Never true   Arboriculturist in the Last Year: Never true  Transportation Needs: No Transportation Needs   Lack of Transportation (Medical): No   Lack of Transportation (Non-Medical): No  Physical Activity: Inactive   Days of Exercise per Week: 0 days   Minutes of Exercise per Session: 0 min  Stress: No Stress Concern Present   Feeling of Stress : Not at all  Social Connections: Socially Integrated   Frequency of Communication with Friends and Family: More than three times a week   Frequency of Social Gatherings with Friends and Family: More than three times a week   Attends Religious Services: More than 4 times per year   Active Member of [employer] or Organizations: Yes   Attends Music therapist: More than 4 times per year   Marital Status: Married  Human resources officer Violence: Not At Risk   Fear of Current or Ex-Partner: No   Emotionally Abused: No   Physically Abused: No   Sexually Abused: No    Outpatient Medications Prior to Visit  Medication Sig Dispense Refill   albuterol (PROVENTIL) (2.5 MG/3ML) 0.083% nebulizer solution Take 3 mLs (2.5 mg total) by nebulization every 6 (six) hours as needed for wheezing or shortness of breath. 150 mL 1   albuterol (VENTOLIN HFA) 108 (90 Base) MCG/ACT inhaler Inhale 2 puffs into the lungs every 6 (six) hours as needed for wheezing or shortness of breath. 1 each 2   amitriptyline (ELAVIL) 25 MG tablet Take 25 mg by mouth at bedtime.     atorvastatin (LIPITOR) 40 MG tablet Take 40 mg by mouth daily. 1 Tablet Daily     bisoprolol (ZEBETA) 5 MG tablet Take 1/2 (one-half) tablet by mouth once daily (Patient taking differently: Take 2.5 mg by mouth daily.) 45 tablet 1   Budeson-Glycopyrrol-Formoterol (BREZTRI AEROSPHERE) 160-9-4.8 MCG/ACT AERO Inhale 2 puffs into the lungs in the morning and at bedtime. 10.7 g 4   budesonide-formoterol (SYMBICORT) 160-4.5 MCG/ACT inhaler  Inhale into the lungs.     chlorthalidone (HYGROTON) 25 MG tablet Take by mouth.     Cholecalciferol (VITAMIN D3) 125 MCG (5000 UT) TABS Take 5,000 Units by mouth daily.     clonazePAM (KLONOPIN) 0.5 MG tablet Take by mouth.     cloNIDine (CATAPRES) 0.2 MG tablet TAKE 1 TABLET BY MOUTH THREE TIMES DAILY (Patient not taking: Reported on 10/24/2020) 90 tablet 0   colchicine 0.6 MG tablet Take 1 tablet by mouth once daily  20 tablet 0   cycloSPORINE (RESTASIS) 0.05 % ophthalmic emulsion Apply to eye.     dapagliflozin propanediol (FARXIGA) 5 MG TABS tablet Take 1 tablet (5 mg total) by mouth daily. 90 tablet 3   famotidine (PEPCID) 20 MG tablet Take 1 tablet (20 mg total) by mouth at bedtime. 30 tablet 3   FEROSUL 325 (65 Fe) MG tablet Take 325 mg by mouth 3 (three) times daily.     furosemide (LASIX) 40 MG tablet Take 40 mg by mouth daily as needed (fluid retention.).     glimepiride (AMARYL) 4 MG tablet Take by mouth.     hydrALAZINE (APRESOLINE) 25 MG tablet TAKE 3 TABLETS BY MOUTH THREE TIMES DAILY 270 tablet 0   hydrALAZINE (APRESOLINE) 50 MG tablet Take 50 mg by mouth 3 (three) times daily.     isosorbide dinitrate (ISORDIL) 20 MG tablet Take 1 tablet (20 mg total) by mouth 3 (three) times daily. 270 tablet 3   lisinopril (ZESTRIL) 20 MG tablet Take 1 tablet by mouth daily.     MYRBETRIQ 25 MG TB24 tablet Take 1 tablet by mouth once daily 90 tablet 1   nitroGLYCERIN (NITROSTAT) 0.4 MG SL tablet Place 1 tablet (0.4 mg total) under the tongue every 5 (five) minutes x 3 doses as needed for chest pain. 30 tablet 12   nitroGLYCERIN (NITROSTAT) 0.4 MG SL tablet Place under the tongue.     omeprazole (PRILOSEC) 40 MG capsule TAKE 1 CAPSULE BY MOUTH ONCE DAILY 30 TO 60 MINUTES PRIOR TO BREAKFAST (Patient taking differently: Take 40 mg by mouth daily.) 30 capsule 11   ondansetron (ZOFRAN) 4 MG tablet Take by mouth.     ONETOUCH ULTRA test strip USE 1 STRIP TO CHECK GLUCOSE ONCE DAILY 100 each 0    polyethylene glycol (MIRALAX / GLYCOLAX) 17 g packet Take 17 g by mouth as needed for mild constipation.     RESTASIS 0.05 % ophthalmic emulsion Place 1 drop into both eyes in the morning and at bedtime.     sertraline (ZOLOFT) 100 MG tablet Take 1 tablet (100 mg total) by mouth daily. 90 tablet 3   sertraline (ZOLOFT) 100 MG tablet Take 1 tablet by mouth daily.     spironolactone (ALDACTONE) 25 MG tablet Take by mouth. (Patient not taking: Reported on 10/24/2020)     tamsulosin (FLOMAX) 0.4 MG CAPS capsule Take 1 capsule (0.4 mg total) by mouth daily after supper. 90 capsule 3   No facility-administered medications prior to visit.    Allergies  Allergen Reactions   Amlodipine Besy-Benazepril Hcl Swelling and Other (See Comments)    Makes tongue swell (lotrel)   Hydrocodone Itching    Can tolerate in low doses   Oxycodone Itching   Phenergan [Promethazine Hcl] Other (See Comments)    "I can't remember."    Review of Systems  Constitutional: Negative.  Negative for chills.  Respiratory:  Positive for cough and shortness of breath.   Gastrointestinal: Negative.   Skin:  Negative for rash.  Neurological:  Positive for headaches.  All other systems reviewed and are negative.     Objective:    Physical Exam Vitals and nursing note reviewed.  Constitutional:      Appearance: He is well-developed. He is obese. He is ill-appearing.  HENT:     Head: Normocephalic.     Nose: Congestion present.  Eyes:     Conjunctiva/sclera: Conjunctivae normal.  Cardiovascular:     Rate and Rhythm: Normal  rate and regular rhythm.     Pulses: Normal pulses.     Heart sounds: Normal heart sounds.  Pulmonary:     Effort: Pulmonary effort is normal.     Breath sounds: Normal breath sounds.  Skin:    General: Skin is warm.     Findings: No rash.  Neurological:     Mental Status: He is alert and oriented to person, place, and time.  Psychiatric:        Behavior: Behavior normal.    BP (!)  199/112   Pulse 92   Temp 99.1 F (37.3 C) (Temporal)   SpO2 91%  Wt Readings from Last 3 Encounters:  10/24/20 276 lb (125.2 kg)  10/20/20 274 lb 9.6 oz (124.6 kg)  10/17/20 265 lb (120.2 kg)    Health Maintenance Due  Topic Date Due   INFLUENZA VACCINE  09/08/2020    There are no preventive care reminders to display for this patient.   Lab Results  Component Value Date   TSH 3.30 02/25/2017   Lab Results  Component Value Date   WBC 7.5 10/17/2020   HGB 14.0 10/17/2020   HCT 43.5 10/17/2020   MCV 91.4 10/17/2020   PLT 211 10/17/2020   Lab Results  Component Value Date   NA 139 10/24/2020   K 4.8 10/24/2020   CO2 24 10/24/2020   GLUCOSE 150 (H) 10/24/2020   BUN 18 10/24/2020   CREATININE 1.12 10/24/2020   BILITOT 0.6 10/17/2020   ALKPHOS 71 10/17/2020   AST 22 10/17/2020   ALT 29 10/17/2020   PROT 6.6 10/17/2020   ALBUMIN 3.8 10/17/2020   CALCIUM 9.1 10/24/2020   ANIONGAP 7 10/17/2020   EGFR 69 10/24/2020   GFR 68.73 02/25/2017   Lab Results  Component Value Date   CHOL 90 (L) 10/04/2019   Lab Results  Component Value Date   HDL 40 10/04/2019   Lab Results  Component Value Date   LDLCALC 26 10/04/2019   Lab Results  Component Value Date   TRIG 142 10/04/2019   Lab Results  Component Value Date   CHOLHDL 2.3 10/04/2019   Lab Results  Component Value Date   HGBA1C 7.9 (H) 07/09/2020       Assessment & Plan:   Problem List Items Addressed This Visit       Other   SOB (shortness of breath)    Take meds as prescribed - Use a cool mist humidifier  -Use saline nose sprays frequently -Force fluids -For fever or aches or pains- take Tylenol or ibuprofen.       Relevant Orders   Novel Coronavirus, NAA ([facility]) (Completed)   DG Chest 2 View (Completed)   Cough - Primary    Take meds as prescribed - Use a cool mist humidifier  -Use saline nose sprays frequently -Force fluids -For fever or aches or pains- take Tylenol or  ibuprofen. -COVID test completed -Amoxicillin 875-125 mg tablet by mouth twice daily. -Chest x-ray completed to rule out pneumonia. -Benzonatate for cough  Follow up with worsening unresolved symptoms      Relevant Medications   benzonatate (TESSALON PERLES) 100 MG capsule   predniSONE (STERAPRED UNI-PAK 21 TAB) 10 MG (21) TBPK tablet   amoxicillin-clavulanate (AUGMENTIN) 875-125 MG tablet   Other Relevant Orders   Novel Coronavirus, NAA ([facility]) (Completed)   Other Visit Diagnoses     Wheezing       Relevant Orders   DG Chest 2 View (Completed)  Meds ordered this encounter  Medications   benzonatate (TESSALON PERLES) 100 MG capsule    Sig: Take 2 capsules (200 mg total) by mouth 3 (three) times daily as needed for cough.    Dispense:  20 capsule    Refill:  0    Order Specific Question:   Supervising Provider    Answer:   Janora Norlander [[id number]]   predniSONE (STERAPRED UNI-PAK 21 TAB) 10 MG (21) TBPK tablet    Sig: 6 tablets day 1, 5 tablets day 2, 4 tablet day 3, 3 tablet day 4, 2 tablet x 5, 1 tablet day 6    Dispense:  1 each    Refill:  0    Order Specific Question:   Supervising Provider    Answer:   Janora Norlander [[id number]]   amoxicillin-clavulanate (AUGMENTIN) 875-125 MG tablet    Sig: Take 1 tablet by mouth 2 (two) times daily.    Dispense:  20 tablet    Refill:  0    Order Specific Question:   Supervising Provider    Answer:   Janora Norlander [[id number]]     Ivy Lynn, NP

## 2020-10-28 ENCOUNTER — Telehealth: Payer: Self-pay | Admitting: Family Medicine

## 2020-10-28 DIAGNOSIS — R0902 Hypoxemia: Secondary | ICD-10-CM | POA: Diagnosis not present

## 2020-10-28 DIAGNOSIS — I5032 Chronic diastolic (congestive) heart failure: Secondary | ICD-10-CM | POA: Diagnosis not present

## 2020-10-28 DIAGNOSIS — G4733 Obstructive sleep apnea (adult) (pediatric): Secondary | ICD-10-CM | POA: Diagnosis not present

## 2020-10-28 LAB — SARS-COV-2, NAA 2 DAY TAT

## 2020-10-28 LAB — NOVEL CORONAVIRUS, NAA: SARS-CoV-2, NAA: NOT DETECTED

## 2020-10-28 NOTE — Assessment & Plan Note (Signed)
Take meds as prescribed - Use a cool mist humidifier  -Use saline nose sprays frequently -Force fluids -For fever or aches or pains- take Tylenol or ibuprofen. -COVID test completed -Amoxicillin 875-125 mg tablet by mouth twice daily. -Chest x-ray completed to rule out pneumonia. -Benzonatate for cough  Follow up with worsening unresolved symptoms

## 2020-10-29 ENCOUNTER — Encounter: Payer: Self-pay | Admitting: Nurse Practitioner

## 2020-10-29 NOTE — Assessment & Plan Note (Signed)
Take meds as prescribed - Use a cool mist humidifier  -Use saline nose sprays frequently -Force fluids -For fever or aches or pains- take Tylenol or ibuprofen.

## 2020-10-30 ENCOUNTER — Ambulatory Visit: Payer: PPO | Admitting: Pharmacist

## 2020-10-30 DIAGNOSIS — J449 Chronic obstructive pulmonary disease, unspecified: Secondary | ICD-10-CM | POA: Diagnosis not present

## 2020-10-30 DIAGNOSIS — E1142 Type 2 diabetes mellitus with diabetic polyneuropathy: Secondary | ICD-10-CM

## 2020-10-30 MED ORDER — DAPAGLIFLOZIN PROPANEDIOL 10 MG PO TABS: 10.0000 mg | ORAL_TABLET | Freq: Every day | ORAL | 4 refills | Status: DC

## 2020-10-30 NOTE — Telephone Encounter (Signed)
Increasing farxiga to 58m with cards blessing Patient stable

## 2020-10-30 NOTE — Addendum Note (Signed)
Addended by: [name] D on: 10/30/2020 08:28 AM   Modules accepted: Orders

## 2020-10-30 NOTE — Telephone Encounter (Signed)
Returned call see pharmd note 10/30/20

## 2020-10-30 NOTE — Telephone Encounter (Signed)
Not urgent  Returned call

## 2020-11-01 ENCOUNTER — Other Ambulatory Visit: Payer: Self-pay | Admitting: Cardiovascular Disease

## 2020-11-01 DIAGNOSIS — I10 Essential (primary) hypertension: Secondary | ICD-10-CM

## 2020-11-03 ENCOUNTER — Telehealth: Payer: Self-pay | Admitting: Family Medicine

## 2020-11-04 ENCOUNTER — Ambulatory Visit (INDEPENDENT_AMBULATORY_CARE_PROVIDER_SITE_OTHER): Payer: PPO | Admitting: Nurse Practitioner

## 2020-11-04 DIAGNOSIS — R059 Cough, unspecified: Secondary | ICD-10-CM | POA: Diagnosis not present

## 2020-11-04 NOTE — Progress Notes (Signed)
Chronic Care Management Pharmacy Note  10/30/2020 Name:  Kenneth Fuller MRN:  [id number] DOB:  02/26/1945  Summary: T2DM  Recommendations/Changes made from today's visit: Diabetes: Uncontrolled-A1C 7.9%; current treatment: OZEMPIC 0.25MG SQ WEEKLY, FARXIGA 10MG DAILY;  Patient to restart Ozempic 0.13m sq weekly--has not been taking, now BG elevated  Denies personal and family history of Medullary thyroid cancer (MTC) Increase farxiga to 155m-patient stable Escribed to [facility] Current glucose readings: fasting glucose <200, post prandial glucose: N/A Denies hypoglycemic/hyperglycemic symptoms Discussed meal planning options and Plate method for healthy eating Avoid sugary drinks and desserts Incorporate balanced protein, non starchy veggies, 1 serving of carbohydrate with each meal Increase water intake Increase physical activity as able Current exercise: UNABLE DUE TO CURRENT STATE--BLOOD PRESSURE LABILE; ENERGY LEVEL DECREASED Educated on medications-purpose & side effects Assessed patient finances. [facility].  They will no longer be servicing patient after this year and with certain medications. PLAN: [facility] Patient has enough Ozempic 0.2532mo last until we can reapply in November 2022.  We will be ordering patient's Breztri via [employer] assistance program--call placed to az&me to add breztri inhaler to patient's medications, [facility] (pharmacy for [facility]me patient assistance).  Hypertension: Uncontrolled- BP goal <130/80; current treatment: bisoprolol, clonidine, hydralazine, isosorbide Current home readings:  traditional automatic cuff (reports labile from 100s-170s SBP) wrist cuff: 140/82, 123/71; PCP in office today 123/71 Reports hypotensive/hypertensive symptoms Counseled on taking all BP medications as prescribed and at  the same time of day to keep steady control; BP is managed by cardiology, therefore patient instructed to call cards for any BP needs; patient to keep strict log of BP times and readings  Follow Up Plan: Telephone follow up appointment with care management team member scheduled for: 1 month   Subjective: Kenneth Fuller an [age]o. year old male who is a primary patient of [name], JosFransisca KaufmannD.  The CCM team was consulted for assistance with disease management and care coordination needs.    Engaged with patient by telephone for follow up visit in response to provider referral for pharmacy case management and/or care coordination services.   Consent to Services:  The patient was given information about Chronic Care Management services, agreed to services, and gave verbal consent prior to initiation of services.  Please see initial visit note for detailed documentation.   Patient Care Team: [name], JosFransisca KaufmannD as PCP - General (Family Medicine) Croitoru, MihDani GobbleD as PCP - Cardiology (Cardiology) Le,Harlen LabsD as Referring Physician (Optometry) ForShea EvansicNorva RiffleCSW as Social Worker (Licensed Clinical Social Worker) HudCori RazorriDelice BisonN as Case Manager Croitoru, MihDani GobbleD as Consulting Physician (Cardiology) PruLavera GuisePHKindred Hospital East Houstonharmacist) BhuLiana GeroldD as Consulting Physician (Nephrology) JacMilus BanisterD as Attending Physician (Gastroenterology) AlvRigoberto NoelD as Consulting Physician (Pulmonary Disease)   Objective:  Lab Results  Component Value Date   CREATININE 1.12 10/24/2020   CREATININE 1.13 10/17/2020   CREATININE 1.25 07/14/2020    Lab Results  Component Value Date   HGBA1C 7.9 (H) 07/09/2020   Last diabetic Eye exam:  Lab Results  Component Value Date/Time   HMDIABEYEEXA No Retinopathy 03/23/2018 12:00 AM    Last diabetic Foot exam: No results found for: HMDIABFOOTEX      Component Value Date/Time   CHOL 90 (L)  10/04/2019  1349   CHOL 111 06/12/2012 1232   TRIG 142 10/04/2019 1349   TRIG 141 01/24/2014 1143   TRIG 192 (H) 06/12/2012 1232   HDL 40 10/04/2019 1349   HDL 38 (L) 01/24/2014 1143   HDL 33 (L) 06/12/2012 1232   CHOLHDL 2.3 10/04/2019 1349   CHOLHDL 4.8 11/22/2006 1712   VLDL 39 11/22/2006 1712   LDLCALC 26 10/04/2019 1349   LDLCALC 27 10/25/2013 1224   LDLCALC 40 06/12/2012 1232    Hepatic Function Latest Ref Rng & Units 10/17/2020 07/14/2020 07/10/2020  Total Protein 6.5 - 8.1 g/dL 6.6 6.8 7.6  Albumin 3.5 - 5.0 g/dL 3.8 4.6 4.2  AST 15 - 41 U/L _0 ALT 0 - 44 U/L 29 33 28  Alk Phosphatase 38 - 126 U/L 71 98 89  Total Bilirubin 0.3 - 1.2 mg/dL 0.6 0.5 0.7  Bilirubin, Direct 0.00 - 0.40 mg/dL - - -    Lab Results  Component Value Date/Time   TSH 3.30 02/25/2017 10:28 AM   TSH 2.300 08/21/2015 01:52 PM    CBC Latest Ref Rng & Units 10/17/2020 07/14/2020 07/10/2020  WBC 4.0 - 10.5 K/uL 7.5 16.7(H) 12.4(H)  Hemoglobin 13.0 - 17.0 g/dL 14.0 16.3 16.4  Hematocrit 39.0 - 52.0 % 43.5 48.5 51.2  Platelets 150 - 400 K/uL 211 250 211    No results found for: VD25OH  Clinical ASCVD: No  The ASCVD Risk score (Arnett DK, et al., 2019) failed to calculate for the following reasons:   The valid total cholesterol range is 130 to 320 mg/dL    Other: (CHADS2VASc if Afib, PHQ9 if depression, MMRC or CAT for COPD, ACT, DEXA)  Social History   Tobacco Use  Smoking Status Former   Packs/day: 1.00   Types: Cigarettes   Start date: 02/09/1964   Quit date: 02/08/1969   Years since quitting: 51.7  Smokeless Tobacco Former   Quit date: 1971   BP Readings from Last 3 Encounters:  10/27/20 (!) 199/112  10/24/20 (!) 152/102  10/20/20 138/84   Pulse Readings from Last 3 Encounters:  10/27/20 92  10/24/20 73  10/20/20 71   Wt Readings from Last 3 Encounters:  10/24/20 276 lb (125.2 kg)  10/20/20 274 lb 9.6 oz (124.6 kg)  10/17/20 265 lb (120.2 kg)    Assessment: Review of patient  past medical history, allergies, medications, health status, including review of consultants reports, laboratory and other test data, was performed as part of comprehensive evaluation and provision of chronic care management services.   SDOH:  (Social Determinants of Health) assessments and interventions performed:    CCM Care Plan  Allergies  Allergen Reactions   Amlodipine Besy-Benazepril Hcl Swelling and Other (See Comments)    Makes tongue swell (lotrel)   Hydrocodone Itching    Can tolerate in low doses   Oxycodone Itching   Phenergan [Promethazine Hcl] Other (See Comments)    "I can't remember."    Medications Reviewed Today     Reviewed by Ivy Lynn, NP (Nurse Practitioner) on 11/04/20 at 1013  Med List Status: <None>   Medication Order Taking? Sig Documenting Provider Last Dose Status Informant  albuterol (PROVENTIL) (2.5 MG/3ML) 0.083% nebulizer solution [id number] No Take 3 mLs (2.5 mg total) by nebulization every 6 (six) hours as needed for wheezing or shortness of breath. [name], [name], MD Taking Active Multiple Informants  albuterol (VENTOLIN HFA) 108 (90 Base) MCG/ACT inhaler [id number] No Inhale 2 puffs into  the lungs every 6 (six) hours as needed for wheezing or shortness of breath. Claretta Fraise, MD Taking Active Multiple Informants  amitriptyline (ELAVIL) 25 MG tablet [id number] No Take 25 mg by mouth at bedtime. [provider] Taking Active Multiple Informants  amoxicillin-clavulanate (AUGMENTIN) 875-125 MG tablet [id number]  Take 1 tablet by mouth 2 (two) times daily. Ivy Lynn, NP  Active   atorvastatin (LIPITOR) 40 MG tablet [id number] No Take 40 mg by mouth daily. 1 Tablet Daily [provider] Taking Active Multiple Informants  benzonatate (TESSALON PERLES) 100 MG capsule [id number]  Take 2 capsules (200 mg total) by mouth 3 (three) times daily as needed for cough. Ivy Lynn, NP  Active   bisoprolol (ZEBETA) 5 MG tablet  [id number] No Take 1/2 (one-half) tablet by mouth once daily  Patient taking differently: Take 2.5 mg by mouth daily.   [name], [name], MD Taking Active Self  Budeson-Glycopyrrol-Formoterol (BREZTRI AEROSPHERE) 160-9-4.8 MCG/ACT AERO [id number] No Inhale 2 puffs into the lungs in the morning and at bedtime. [name], [name], MD Taking Active Multiple Informants  chlorthalidone (HYGROTON) 25 MG tablet [id number] No Take by mouth. [provider] Taking Active Multiple Informants  Cholecalciferol (VITAMIN D3) 125 MCG (5000 UT) TABS [id number] No Take 5,000 Units by mouth daily. [provider] Taking Active Multiple Informants  clonazePAM (KLONOPIN) 0.5 MG tablet [id number] No Take by mouth. [provider] Taking Active   cloNIDine (CATAPRES) 0.2 MG tablet [id number] No TAKE 1 TABLET BY MOUTH THREE TIMES DAILY  Patient not taking: Reported on 10/24/2020   [name], [name], MD Not Taking Active Multiple Informants  colchicine 0.6 MG tablet [id number] No Take 1 tablet by mouth once daily [name], [name], MD Taking Active Multiple Informants  cycloSPORINE (RESTASIS) 0.05 % ophthalmic emulsion [id number] No Apply to eye. [provider] Taking Active Multiple Informants  dapagliflozin propanediol (FARXIGA) 10 MG TABS tablet [id number]  Take 1 tablet (10 mg total) by mouth daily. [name], [name], MD  Active   famotidine (PEPCID) 20 MG tablet [id number] No Take 1 tablet (20 mg total) by mouth at bedtime. Martyn Ehrich, NP Taking Active Multiple Informants  FEROSUL 325 (65 Fe) MG tablet [id number] No Take 325 mg by mouth 3 (three) times daily. [provider] Taking Active Multiple Informants  furosemide (LASIX) 40 MG tablet [id number] No Take 40 mg by mouth daily as needed (fluid retention.). [provider] Taking Active Multiple Informants  glimepiride (AMARYL) 4 MG tablet [id number] No Take by mouth. [provider] Taking Active  Multiple Informants  hydrALAZINE (APRESOLINE) 25 MG tablet [id number]  TAKE 3 TABLETS BY MOUTH THREE TIMES DAILY Croitoru, Mihai, MD  Active   hydrALAZINE (APRESOLINE) 50 MG tablet [id number] No Take 50 mg by mouth 3 (three) times daily. [provider] Taking Active Multiple Informants  isosorbide dinitrate (ISORDIL) 20 MG tablet [id number] No Take 1 tablet (20 mg total) by mouth 3 (three) times daily. [name], [name], MD Taking Active Multiple Informants  lisinopril (ZESTRIL) 20 MG tablet [id number] No Take 1 tablet by mouth daily. [provider] Taking Active Multiple Informants  MYRBETRIQ 25 MG TB24 tablet [id number] No Take 1 tablet by mouth once daily [name], [name], MD Taking Active Multiple Informants  nitroGLYCERIN (NITROSTAT) 0.4 MG SL tablet [id number] No Place 1 tablet (0.4 mg total) under the tongue every 5 (five) minutes x 3 doses as needed for chest pain. Manuella Ghazi, Pratik D, DO Taking Expired 07/07/20 [phone]  Med Note (DAVIS, SOPHIA A   Tue Jul 15, 2020  8:58 AM) On hand  nitroGLYCERIN (NITROSTAT) 0.4 MG SL tablet [id number] No Place under the tongue. [provider] Taking Active Multiple Informants  omeprazole (PRILOSEC) 40 MG capsule [id number] No TAKE 1 CAPSULE BY MOUTH ONCE DAILY 30 TO 60 MINUTES PRIOR TO BREAKFAST  Patient taking differently: Take 40 mg by mouth daily.   Milus Banister, MD Taking Active   ondansetron [facility]) 4 MG tablet [id number] No Take by mouth. [provider] Taking Active Multiple Informants  ONETOUCH ULTRA test strip [id number] No USE 1 STRIP TO CHECK GLUCOSE ONCE DAILY [name], [name], MD Taking Active Multiple Informants  polyethylene glycol (MIRALAX / GLYCOLAX) 17 g packet [id number] No Take 17 g by mouth as needed for mild constipation. [provider] Taking Active Multiple Informants  predniSONE (STERAPRED UNI-PAK 21 TAB) 10 MG (21) TBPK tablet [id number]  6 tablets day 1, 5 tablets day 2, 4 tablet  day 3, 3 tablet day 4, 2 tablet x 5, 1 tablet day 6 Ivy Lynn, NP  Active   RESTASIS 0.05 % ophthalmic emulsion [id number] No Place 1 drop into both eyes in the morning and at bedtime. [provider] Taking Active Multiple Informants  sertraline (ZOLOFT) 100 MG tablet [id number] No Take 1 tablet (100 mg total) by mouth daily. [name], [name], MD Taking Active Multiple Informants  sertraline (ZOLOFT) 100 MG tablet [id number] No Take 1 tablet by mouth daily. [provider] Taking Active Multiple Informants  spironolactone (ALDACTONE) 25 MG tablet [id number] No Take by mouth.  Patient not taking: Reported on 10/24/2020   [provider] Not Taking Active Multiple Informants  tamsulosin (FLOMAX) 0.4 MG CAPS capsule [id number] No Take 1 capsule (0.4 mg total) by mouth daily after supper. [name], [name], MD Taking Active Multiple Informants           Med Note Tressie Ellis Dec 14, 2019  4:07 PM)              Patient Active Problem List   Diagnosis Date Noted   Cough 10/27/2020   Chronic respiratory failure with hypoxia ([place]) 10/20/2020   Asthma exacerbation 07/09/2020   Chest pain 06/06/2020   Right upper lobe pneumonia 05/07/2020   Irritable larynx 05/07/2020   Hyperkalemia 06/05/2019   Dizziness 06/04/2019   Near syncope 06/04/2019   Insomnia    CHF (congestive heart failure) ([place]) 01/06/2018   Depression with anxiety 01/04/2018   CKD (chronic kidney disease), stage III ([place]) 01/04/2018   Chronic obstructive pulmonary disease ([place]) 01/04/2018   Depression, recurrent ([place]) 11/11/2017   Chronic diastolic heart failure ([place]) 04/13/2017   SOB (shortness of breath) 01/26/2017   Swelling of lower extremity 11/30/2016   Obesity, Class III, BMI 40-49.9 (morbid obesity) ([place]) 08/21/2015   Morbid obesity ([place]) 08/21/2015   Coronary artery disease involving native coronary artery of native heart without angina pectoris 12/05/2014   Urinary  urgency 10/15/2014   Spinal stenosis, lumbar region, with neurogenic claudication 01/02/2014   Hyperlipidemia associated with type 2 diabetes mellitus ([place]) 10/25/2013   Hyperlipidemia, unspecified 10/25/2013   COLONIC POLYPS, ADENOMATOUS 02/25/2007   Diabetes mellitus type II, non insulin dependent ([place]) 02/25/2007   Gout 02/25/2007   Essential hypertension 02/25/2007   Cough variant asthma 02/25/2007   OSA on CPAP 02/25/2007   FATIGUE, CHRONIC 02/25/2007   HEADACHE, CHRONIC 02/25/2007   GERD (gastroesophageal reflux disease) 02/25/2007  Immunization History  Administered Date(s) Administered   Fluad Quad(high Dose 65+) 11/15/2018   Influenza Whole 10/09/2008   Influenza, High Dose Seasonal PF 11/29/2016, 11/08/2017   Influenza,inj,Quad PF,6+ Mos 12/04/2013, 12/04/2014, 12/23/2015   Pneumococcal Conjugate-13 12/04/2013   Pneumococcal Polysaccharide-23 08/26/2011   Td 11/08/2017    Conditions to be addressed/monitored: DMII  Care Plan : PHARMD MEDICATION MANAGMENT  Updates made by [name], [place] since 11/04/2020 12:00 AM     Problem: DISEASE PREVENTION PROGRESSION      Long-Range Goal: T2DM, HTN   Recent Progress: Not on track  Priority: High  Note:   Current Barriers:  Unable to independently afford treatment regimen Unable to maintain control of T2DM  Pharmacist Clinical Goal(s):  Over the next 90 days, patient will verbalize ability to afford treatment regimen maintain control of T2DM as evidenced by IMPROVED GLYCEMIC CONTROL  through collaboration with PharmD and provider.    Interventions: 1:1 collaboration with [name], [name], MD regarding development and update of comprehensive plan of care as evidenced by provider attestation and co-signature Inter-disciplinary care team collaboration (see longitudinal plan of care) Comprehensive medication review performed; medication list updated in electronic medical record  Diabetes: Uncontrolled-A1C  7.9%; current treatment: OZEMPIC 0.25MG SQ WEEKLY, FARXIGA 10MG DAILY;  Patient to restart Ozempic 0.67m sq weekly--has not been taking, now BG elevated  Denies personal and family history of Medullary thyroid cancer (MTC) Increase farxiga to 147m-patient stable Escribed to [facility] Current glucose readings: fasting glucose <200, post prandial glucose: N/A Denies hypoglycemic/hyperglycemic symptoms Discussed meal planning options and Plate method for healthy eating Avoid sugary drinks and desserts Incorporate balanced protein, non starchy veggies, 1 serving of carbohydrate with each meal Increase water intake Increase physical activity as able Current exercise: UNABLE DUE TO CURRENT STATE--BLOOD PRESSURE LABILE; ENERGY LEVEL DECREASED Educated on medications-purpose & side effects Assessed patient finances. [facility]  They will no longer be servicing patient after this year and with certain medications. PLAN: [facility] Patient has enough Ozempic 0.25107mo last until we can reapply in November 2022.  We will be ordering patient's Breztri via [employer] assistance program--call placed to [employer]me to add breztri inhaler to patient's medications, [facility] (pharmacy for [facility]me patient assistance).  Hypertension: Uncontrolled- BP goal <130/80; current treatment: bisoprolol, clonidine, hydralazine, isosorbide Current home readings:  traditional automatic cuff (reports labile from 100s-170s SBP) wrist cuff: 140/82, 123/71; PCP in office today 123/71 Reports hypotensive/hypertensive symptoms Counseled on taking all BP medications as prescribed and at the same time of day to keep steady control; BP is managed by cardiology, therefore patient instructed to call cards for any BP needs; patient to keep strict log of BP times and readings   Patient Goals/Self-Care  Activities Over the next 90 days, patient will:  - take medications as prescribed check glucose DAILY (FASTING) OR IF SYMPTOMATIC , document, and provide at future appointments check blood pressure DAILY OR IF SYMPTOMATIC, document, and provide at future appointments  Follow Up Plan: Telephone follow up appointment with care management team member scheduled for: 1 month      Medication Assistance:  SEE NOTE ABOVE  [facility]:  [facility] [id number]: [phone]: [phone] ([address]  [place] [address] Phone: [phone] Fax: [place], [place]. [place] [address] Phone: [phone] Fax: [phone]   Follow Up:  Patient agrees to Care Plan and Follow-up.  Plan: Telephone follow up appointment with care management team member scheduled for:  2 WEEKS  Regina Eck, PharmD, BCPS Clinical Pharmacist, [place]  II Phone [phone]

## 2020-11-04 NOTE — Progress Notes (Signed)
   Virtual Visit  Note Due to COVID-19 pandemic this visit was conducted virtually. This visit type was conducted due to national recommendations for restrictions regarding the COVID-19 Pandemic (e.g. social distancing, sheltering in place) in an effort to limit this patient's exposure and mitigate transmission in our community. All issues noted in this document were discussed and addressed.  A physical exam was not performed with this format.  I connected with Kenneth Fuller on 11/04/20 at 09:00 am  by telephone and verified that I am speaking with the correct person using two identifiers. Kenneth Fuller is currently located at home during visit. The provider, Ivy Lynn, NP is located in their office at time of visit.  I discussed the limitations, risks, security and privacy concerns of performing an evaluation and management service by telephone and the availability of in person appointments. I also discussed with the patient that there may be a patient responsible charge related to this service. The patient expressed understanding and agreed to proceed.   History and Present Illness:  Cough This is a recurrent problem. The current episode started in the past 7 days. The problem has been unchanged. The problem occurs constantly. The cough is Non-productive. Pertinent negatives include no chest pain, chills, ear congestion, myalgias, nasal congestion, sore throat or weight loss. Nothing aggravates the symptoms. He has tried oral steroids and cool air for the symptoms. The treatment provided mild relief.     Review of Systems  Constitutional:  Negative for chills and weight loss.  HENT:  Negative for sore throat.   Respiratory:  Positive for cough.   Cardiovascular:  Negative for chest pain.  Musculoskeletal:  Negative for myalgias.    Observations/Objective: Televisit patient not in distress  Assessment and Plan: Take meds as prescribed -Advised patient to complete  antibiotics/benzonatate/prednisone taper as prescribed 7 days ago. - Use a cool mist humidifier  -Use saline nose sprays frequently -Force fluids -For fever or aches or pains- take Tylenol or ibuprofen. -If symptoms do not improve, he may need to be COVID tested to rule this out Follow up with worsening unresolved symptoms   Follow Up Instructions: Follow-up with unresolved symptoms    I discussed the assessment and treatment plan with the patient. The patient was provided an opportunity to ask questions and all were answered. The patient agreed with the plan and demonstrated an understanding of the instructions.   The patient was advised to call back or seek an in-person evaluation if the symptoms worsen or if the condition fails to improve as anticipated.  The above assessment and management plan was discussed with the patient. The patient verbalized understanding of and has agreed to the management plan. Patient is aware to call the clinic if symptoms persist or worsen. Patient is aware when to return to the clinic for a follow-up visit. Patient educated on when it is appropriate to go to the emergency department.   Time call ended: 9:08 AM  I provided 8 minutes of  non face-to-face time during this encounter.    Ivy Lynn, NP

## 2020-11-04 NOTE — Patient Instructions (Signed)

## 2020-11-04 NOTE — Patient Instructions (Signed)
Visit Information  PATIENT GOALS:  Goals Addressed               This Visit's Progress     Patient Stated     T2DM (pt-stated)        Current Barriers:  Unable to independently afford treatment regimen Unable to maintain control of T2DM  Pharmacist Clinical Goal(s):  Over the next 90 days, patient will verbalize ability to afford treatment regimen maintain control of T2DM as evidenced by IMPROVED GLYCEMIC CONTROL  through collaboration with PharmD and provider.    Interventions: 1:1 collaboration with [name], [name], MD regarding development and update of comprehensive plan of care as evidenced by provider attestation and co-signature Inter-disciplinary care team collaboration (see longitudinal plan of care) Comprehensive medication review performed; medication list updated in electronic medical record  Diabetes: Uncontrolled-A1C 7.9%; current treatment: OZEMPIC 0.25MG SQ WEEKLY, FARXIGA 10MG DAILY;  Patient to restart Ozempic 0.52m sq weekly--has not been taking, now BG elevated  Denies personal and family history of Medullary thyroid cancer (MTC) Increase farxiga to 117m-patient stable Escribed to [facility] Current glucose readings: fasting glucose <200, post prandial glucose: N/A Denies hypoglycemic/hyperglycemic symptoms Discussed meal planning options and Plate method for healthy eating Avoid sugary drinks and desserts Incorporate balanced protein, non starchy veggies, 1 serving of carbohydrate with each meal Increase water intake Increase physical activity as able Current exercise: UNABLE DUE TO CURRENT STATE--BLOOD PRESSURE LABILE; ENERGY LEVEL DECREASED Educated on medications-purpose & side effects Assessed patient finances. [facility]  They will no longer be servicing patient after this year and with certain medications. PLAN[facility] Ozempic  0.2521mo last until we can reapply in November 2022.  We will be ordering patient's Breztri via [employer] assistance program--call placed to [employer] to add breztri inhaler to patient's medications, [facility] (pharmacy for [facility] patient assistance).  Hypertension: Uncontrolled- BP goal <130/80; current treatment: bisoprolol, clonidine, hydralazine, isosorbide Current home readings:  traditional automatic cuff (reports labile from 100s-170s SBP) wrist cuff: 140/82, 123/71; PCP in office today 123/71 Reports hypotensive/hypertensive symptoms Counseled on taking all BP medications as prescribed and at the same time of day to keep steady control; BP is managed by cardiology, therefore patient instructed to call cards for any BP needs; patient to keep strict log of BP times and readings   Patient Goals/Self-Care Activities Over the next 90 days, patient will:  - take medications as prescribed check glucose DAILY (FASTING) OR IF SYMPTOMATIC , document, and provide at future appointments check blood pressure DAILY OR IF SYMPTOMATIC, document, and provide at future appointments  Follow Up Plan: Telephone follow up appointment with care management team member scheduled for: 1 month         The patient verbalized understanding of instructions, educational materials, and care plan provided today and declined offer to receive copy of patient instructions, educational materials, and care plan.   Telephone follow up appointment with care management team member scheduled for:  Signature JulRegina EckharmD, BCPS Clinical Pharmacist, [place] Phone [phone]

## 2020-11-04 NOTE — Assessment & Plan Note (Signed)
Take meds as prescribed -Advised patient to complete antibiotics/benzonatate/prednisone taper as prescribed 7 days ago. - Use a cool mist humidifier  -Use saline nose sprays frequently -Force fluids -For fever or aches or pains- take Tylenol or ibuprofen. -If symptoms do not improve, she may need to be COVID tested to rule this out Follow up with worsening unresolved symptoms

## 2020-11-06 ENCOUNTER — Other Ambulatory Visit: Payer: Self-pay

## 2020-11-06 DIAGNOSIS — R059 Cough, unspecified: Secondary | ICD-10-CM

## 2020-11-06 MED ORDER — PREDNISONE 10 MG (21) PO TBPK: ORAL_TABLET | ORAL | 0 refills | Status: DC

## 2020-11-06 MED ORDER — AMOXICILLIN-POT CLAVULANATE 875-125 MG PO TABS: 1.0000 | ORAL_TABLET | Freq: Two times a day (BID) | ORAL | 0 refills | Status: DC

## 2020-11-06 NOTE — Telephone Encounter (Signed)
I am okay to refill the Augmentin and the prednisone taper that he had, go ahead and send those for him but if he continues to have issues he needs to come back and be seen.  Watch his blood sugars very closely

## 2020-11-06 NOTE — Telephone Encounter (Signed)
Lattie Haw made aware. Rx's sent to [facility] in [place]. Will call back for an appt if no better. She will keep a close eye on his blood sugars.

## 2020-11-07 DIAGNOSIS — I5042 Chronic combined systolic (congestive) and diastolic (congestive) heart failure: Secondary | ICD-10-CM | POA: Diagnosis not present

## 2020-11-07 DIAGNOSIS — F339 Major depressive disorder, recurrent, unspecified: Secondary | ICD-10-CM | POA: Diagnosis not present

## 2020-11-07 DIAGNOSIS — F418 Other specified anxiety disorders: Secondary | ICD-10-CM

## 2020-11-07 DIAGNOSIS — I2510 Atherosclerotic heart disease of native coronary artery without angina pectoris: Secondary | ICD-10-CM

## 2020-11-07 DIAGNOSIS — J449 Chronic obstructive pulmonary disease, unspecified: Secondary | ICD-10-CM | POA: Diagnosis not present

## 2020-11-07 DIAGNOSIS — E1142 Type 2 diabetes mellitus with diabetic polyneuropathy: Secondary | ICD-10-CM | POA: Diagnosis not present

## 2020-11-07 DIAGNOSIS — I10 Essential (primary) hypertension: Secondary | ICD-10-CM | POA: Diagnosis not present

## 2020-11-14 ENCOUNTER — Ambulatory Visit (INDEPENDENT_AMBULATORY_CARE_PROVIDER_SITE_OTHER): Payer: PPO | Admitting: Pharmacist

## 2020-11-14 DIAGNOSIS — E1142 Type 2 diabetes mellitus with diabetic polyneuropathy: Secondary | ICD-10-CM

## 2020-11-20 ENCOUNTER — Other Ambulatory Visit: Payer: Self-pay

## 2020-11-20 ENCOUNTER — Ambulatory Visit (INDEPENDENT_AMBULATORY_CARE_PROVIDER_SITE_OTHER): Payer: PPO | Admitting: Family

## 2020-11-20 ENCOUNTER — Ambulatory Visit (INDEPENDENT_AMBULATORY_CARE_PROVIDER_SITE_OTHER): Payer: PPO

## 2020-11-20 ENCOUNTER — Encounter: Payer: Self-pay | Admitting: Family

## 2020-11-20 VITALS — BP 154/87 | HR 83 | Temp 97.2°F | Ht 67.0 in | Wt 276.0 lb

## 2020-11-20 DIAGNOSIS — R059 Cough, unspecified: Secondary | ICD-10-CM | POA: Diagnosis not present

## 2020-11-20 DIAGNOSIS — J441 Chronic obstructive pulmonary disease with (acute) exacerbation: Secondary | ICD-10-CM | POA: Diagnosis not present

## 2020-11-20 MED ORDER — BENZONATATE 200 MG PO CAPS
200.0000 mg | ORAL_CAPSULE | Freq: Two times a day (BID) | ORAL | 2 refills | Status: DC | PRN
Start: 2020-11-20 — End: 2021-03-13

## 2020-11-20 MED ORDER — CETIRIZINE HCL 10 MG PO TABS: 10.0000 mg | ORAL_TABLET | Freq: Every day | ORAL | 11 refills | Status: DC

## 2020-11-20 MED ORDER — FLUTICASONE PROPIONATE 50 MCG/ACT NA SUSP: 2.0000 | Freq: Every day | NASAL | 6 refills | Status: AC

## 2020-11-20 MED ORDER — PREDNISONE 10 MG PO TABS: ORAL_TABLET | ORAL | 0 refills | Status: DC

## 2020-11-20 NOTE — Patient Instructions (Signed)

## 2020-11-20 NOTE — Progress Notes (Signed)
Subjective:    Patient ID: Kenneth Fuller, male    DOB: [birth year], 74 y.o.   MRN: [id number]  Chief Complaint  Patient presents with   Shortness of Breath   Cough   COPD   Wheezing   PT presents to the office today with COPD and cough. He is followed by Pulmonologist. She started him on home O2 as needed.   He has been on steroids and antibiotics multiple times over the last few months.  Shortness of Breath Associated symptoms include headaches and wheezing. Pertinent negatives include no ear pain, fever or sore throat. His past medical history is significant for COPD.  Cough This is a recurrent problem. The current episode started more than 1 month ago. The problem has been waxing and waning. The problem occurs every few minutes. The cough is Non-productive. Associated symptoms include headaches, shortness of breath and wheezing. Pertinent negatives include no chills, ear congestion, ear pain, fever, myalgias, nasal congestion, postnasal drip or sore throat. The symptoms are aggravated by lying down. He has tried rest [name]) for the symptoms. His past medical history is significant for COPD.  COPD He complains of cough, shortness of breath and wheezing. Associated symptoms include headaches. Pertinent negatives include no ear congestion, ear pain, fever, myalgias, nasal congestion, postnasal drip or sore throat. His past medical history is significant for COPD.  Wheezing  Associated symptoms include coughing, headaches and shortness of breath. Pertinent negatives include no chills, ear pain, fever or sore throat. His past medical history is significant for COPD.     Review of Systems  Constitutional:  Negative for chills and fever.  HENT:  Negative for ear pain, postnasal drip and sore throat.   Respiratory:  Positive for cough, shortness of breath and wheezing.   Musculoskeletal:  Negative for myalgias.  Neurological:  Positive for headaches.  All other systems reviewed and  are negative.     Objective:   Physical Exam Vitals reviewed.  Constitutional:      General: He is not in acute distress.    Appearance: He is well-developed. He is obese.  HENT:     Head: Normocephalic.     Right Ear: External ear normal.     Left Ear: External ear normal.  Eyes:     General:        Right eye: No discharge.        Left eye: No discharge.     Pupils: Pupils are equal, round, and reactive to light.  Neck:     Thyroid: No thyromegaly.  Cardiovascular:     Rate and Rhythm: Normal rate and regular rhythm.     Heart sounds: Normal heart sounds. No murmur heard. Pulmonary:     Effort: Pulmonary effort is normal. No respiratory distress.     Breath sounds: Examination of the right-lower field reveals wheezing. Examination of the left-lower field reveals wheezing. Wheezing present.  Abdominal:     General: Bowel sounds are normal. There is no distension.     Palpations: Abdomen is soft.     Tenderness: There is no abdominal tenderness.  Musculoskeletal:        General: No tenderness. Normal range of motion.     Cervical back: Normal range of motion and neck supple.  Skin:    General: Skin is warm and dry.     Findings: No erythema or rash.  Neurological:     Mental Status: He is alert and oriented to person, place, and time.  Cranial Nerves: No cranial nerve deficit.     Deep Tendon Reflexes: Reflexes are normal and symmetric.  Psychiatric:        Behavior: Behavior normal.        Thought Content: Thought content normal.        Judgment: Judgment normal.      BP (!) 154/87   Pulse 83   Temp (!) 97.2 F (36.2 C) (Temporal)   Ht _0  (1.702 m)   Wt 276 lb (125.2 kg)   SpO2 94%   BMI 43.23 kg/m      Assessment & Plan:  Kenneth Fuller comes in today with chief complaint of Shortness of Breath, Cough, COPD, and Wheezing   Diagnosis and orders addressed:  1. Chronic obstructive pulmonary disease with acute exacerbation (HCC) Start zyrtec  and flonase Will refill Tessalon and increase to 200 mg from 100 mg  Continue Breztri  - benzonatate (TESSALON) 200 MG capsule; Take 1 capsule (200 mg total) by mouth 2 (two) times daily as needed for cough.  Dispense: 90 capsule; Refill: 2 - predniSONE (DELTASONE) 10 MG tablet; Days 1-4 take 4 tablets (40 mg) daily  Days 5-8 take 3 tablets (30 mg) daily, Days 9-11 take 2 tablets (20 mg) daily, Days 12-14 take 1 tablet (10 mg) daily.  Dispense: 37 tablet; Refill: 0 - DG Chest 2 View - cetirizine (ZYRTEC) 10 MG tablet; Take 1 tablet (10 mg total) by mouth daily.  Dispense: 30 tablet; Refill: 11 - fluticasone (FLONASE) 50 MCG/ACT nasal spray; Place 2 sprays into both nostrils daily.  Dispense: 16 g; Refill: [place], FNP

## 2020-11-21 ENCOUNTER — Ambulatory Visit: Payer: PPO | Admitting: Licensed Clinical Social Worker

## 2020-11-21 DIAGNOSIS — J441 Chronic obstructive pulmonary disease with (acute) exacerbation: Secondary | ICD-10-CM

## 2020-11-21 DIAGNOSIS — N1832 Chronic kidney disease, stage 3b: Secondary | ICD-10-CM

## 2020-11-21 DIAGNOSIS — F339 Major depressive disorder, recurrent, unspecified: Secondary | ICD-10-CM

## 2020-11-21 DIAGNOSIS — E1142 Type 2 diabetes mellitus with diabetic polyneuropathy: Secondary | ICD-10-CM

## 2020-11-21 DIAGNOSIS — I2510 Atherosclerotic heart disease of native coronary artery without angina pectoris: Secondary | ICD-10-CM

## 2020-11-21 DIAGNOSIS — I10 Essential (primary) hypertension: Secondary | ICD-10-CM

## 2020-11-21 DIAGNOSIS — I5042 Chronic combined systolic (congestive) and diastolic (congestive) heart failure: Secondary | ICD-10-CM

## 2020-11-21 NOTE — Patient Instructions (Signed)
Visit Information  PATIENT GOALS:  Goals Addressed             This Visit's Progress    manage health issues of client; manage anxiety issues of client       Timeframe:  Short Term Goal Priority:  Medium Progress; On Track  Start Date: 11/21/20                             Expected End Date:   02/13/21                 Follow Up Date 01/14/21 at 10:00 AM   Protect My Health (Patient)  Manage Health issues of client; manage anxiety issues of client    Why is this important?   Screening tests can find diseases early when they are easier to treat.  Your doctor or nurse will talk with you about which tests are important for you.  Getting shots for common diseases like the flu and shingles will help prevent them.     Patient Coping Skills/Strengths: Has family support Has no transport issues Attends scheduled medical appointments Takes medications as prescribed Completes ADLs independently  Patient Deficits: Some mobility challenges Pain issues Breathing challenges  Patient Goals:  Patient will attend all scheduled client medical appointments in next 30 days Patient will call RNCM or LCSW as needed for CCM support in next 30 days Patient will communicate regularly with his spouse in next 30 days to discuss needs of client  Follow Up Plan:  LCSW to call client or his spouse on 01/14/21 at 10:00 AM to assess needs of client    [name].Merced Brougham MSW, LCSW Licensed Clinical Social Worker [facility] Care Management [phone]

## 2020-11-21 NOTE — Chronic Care Management (AMB) (Signed)
Chronic Care Management    Clinical Social Work Note  11/21/2020 Name: Kenneth Fuller MRN: [id number] DOB: [birth year]  Kenneth Fuller is a 75 y.o. year old male who is a primary care patient of [name], [name], MD. The CCM team was consulted to assist the patient with chronic disease management and/or care coordination needs related to: [name] .   Engaged with patient by telephone for follow up visit in response to provider referral for social work chronic care management and care coordination services.   Consent to Services:  The patient was given information about Chronic Care Management services, agreed to services, and gave verbal consent prior to initiation of services.  Please see initial visit note for detailed documentation.   Patient agreed to services and consent obtained.   Assessment: Review of patient past medical history, allergies, medications, and health status, including review of relevant consultants reports was performed today as part of a comprehensive evaluation and provision of chronic care management and care coordination services.     SDOH (Social Determinants of Health) assessments and interventions performed:  SDOH Interventions    Flowsheet Row Most Recent Value  SDOH Interventions   Physical Activity Interventions Other (Comments)  [client has walking challenges,  he sometimes gets short of breath when walking]  Stress Interventions Provide Counseling  [client has stress related to managing his medical needs]  Depression Interventions/Treatment  Counseling, Currently on Treatment        Advanced Directives Status: See Vynca application for related entries.  CCM Care Plan  Allergies  Allergen Reactions   Amlodipine Besy-Benazepril Hcl Swelling and Other (See Comments)    Makes tongue swell (lotrel)   Hydrocodone Itching    Can tolerate in low doses   Oxycodone Itching   Phenergan [Promethazine Hcl] Other (See Comments)     "I can't remember."    Outpatient Encounter Medications as of 11/21/2020  Medication Sig Note   albuterol (PROVENTIL) (2.5 MG/3ML) 0.083% nebulizer solution Take 3 mLs (2.5 mg total) by nebulization every 6 (six) hours as needed for wheezing or shortness of breath.    albuterol (VENTOLIN HFA) 108 (90 Base) MCG/ACT inhaler Inhale 2 puffs into the lungs every 6 (six) hours as needed for wheezing or shortness of breath.    amitriptyline (ELAVIL) 25 MG tablet Take 25 mg by mouth at bedtime.    atorvastatin (LIPITOR) 40 MG tablet Take 40 mg by mouth daily. 1 Tablet Daily    benzonatate (TESSALON) 200 MG capsule Take 1 capsule (200 mg total) by mouth 2 (two) times daily as needed for cough.    bisoprolol (ZEBETA) 5 MG tablet Take 1/2 (one-half) tablet by mouth once daily (Patient taking differently: Take 2.5 mg by mouth daily.)    Budeson-Glycopyrrol-Formoterol (BREZTRI AEROSPHERE) 160-9-4.8 MCG/ACT AERO Inhale 2 puffs into the lungs in the morning and at bedtime.    cetirizine (ZYRTEC) 10 MG tablet Take 1 tablet (10 mg total) by mouth daily.    chlorthalidone (HYGROTON) 25 MG tablet Take by mouth.    Cholecalciferol (VITAMIN D3) 125 MCG (5000 UT) TABS Take 5,000 Units by mouth daily.    clonazePAM (KLONOPIN) 0.5 MG tablet Take by mouth.    cloNIDine (CATAPRES) 0.2 MG tablet TAKE 1 TABLET BY MOUTH THREE TIMES DAILY    colchicine 0.6 MG tablet Take 1 tablet by mouth once daily    cycloSPORINE (RESTASIS) 0.05 % ophthalmic emulsion Apply to eye.    dapagliflozin propanediol (FARXIGA) 10 MG TABS tablet  Take 1 tablet (10 mg total) by mouth daily.    famotidine (PEPCID) 20 MG tablet Take 1 tablet (20 mg total) by mouth at bedtime.    FEROSUL 325 (65 Fe) MG tablet Take 325 mg by mouth 3 (three) times daily.    fluticasone (FLONASE) 50 MCG/ACT nasal spray Place 2 sprays into both nostrils daily.    furosemide (LASIX) 40 MG tablet Take 40 mg by mouth daily as needed (fluid retention.).    glimepiride  (AMARYL) 4 MG tablet Take by mouth.    hydrALAZINE (APRESOLINE) 25 MG tablet TAKE 3 TABLETS BY MOUTH THREE TIMES DAILY    hydrALAZINE (APRESOLINE) 50 MG tablet Take 50 mg by mouth 3 (three) times daily.    isosorbide dinitrate (ISORDIL) 20 MG tablet Take 1 tablet (20 mg total) by mouth 3 (three) times daily.    lisinopril (ZESTRIL) 20 MG tablet Take 1 tablet by mouth daily.    MYRBETRIQ 25 MG TB24 tablet Take 1 tablet by mouth once daily    nitroGLYCERIN (NITROSTAT) 0.4 MG SL tablet Place 1 tablet (0.4 mg total) under the tongue every 5 (five) minutes x 3 doses as needed for chest pain. 07/15/2020: On hand   nitroGLYCERIN (NITROSTAT) 0.4 MG SL tablet Place under the tongue.    omeprazole (PRILOSEC) 40 MG capsule TAKE 1 CAPSULE BY MOUTH ONCE DAILY 30 TO 60 MINUTES PRIOR TO BREAKFAST (Patient taking differently: Take 40 mg by mouth daily.)    ondansetron (ZOFRAN) 4 MG tablet Take by mouth.    ONETOUCH ULTRA test strip USE 1 STRIP TO CHECK GLUCOSE ONCE DAILY    polyethylene glycol (MIRALAX / GLYCOLAX) 17 g packet Take 17 g by mouth as needed for mild constipation.    predniSONE (DELTASONE) 10 MG tablet Days 1-4 take 4 tablets (40 mg) daily  Days 5-8 take 3 tablets (30 mg) daily, Days 9-11 take 2 tablets (20 mg) daily, Days 12-14 take 1 tablet (10 mg) daily.    RESTASIS 0.05 % ophthalmic emulsion Place 1 drop into both eyes in the morning and at bedtime.    sertraline (ZOLOFT) 100 MG tablet Take 1 tablet (100 mg total) by mouth daily.    sertraline (ZOLOFT) 100 MG tablet Take 1 tablet by mouth daily.    spironolactone (ALDACTONE) 25 MG tablet Take by mouth.    tamsulosin (FLOMAX) 0.4 MG CAPS capsule Take 1 capsule (0.4 mg total) by mouth daily after supper.    No facility-administered encounter medications on file as of 11/21/2020.    Patient Active Problem List   Diagnosis Date Noted   Cough 10/27/2020   Chronic respiratory failure with hypoxia ([place]) 10/20/2020   Asthma exacerbation 07/09/2020    Chest pain 06/06/2020   Right upper lobe pneumonia 05/07/2020   Irritable larynx 05/07/2020   Hyperkalemia 06/05/2019   Dizziness 06/04/2019   Near syncope 06/04/2019   Insomnia    CHF (congestive heart failure) ([place]) 01/06/2018   Depression with anxiety 01/04/2018   CKD (chronic kidney disease), stage III ([place]) 01/04/2018   Chronic obstructive pulmonary disease ([place]) 01/04/2018   Depression, recurrent ([place]) 11/11/2017   Chronic diastolic heart failure ([place]) 04/13/2017   SOB (shortness of breath) 01/26/2017   Swelling of lower extremity 11/30/2016   Obesity, Class III, BMI 40-49.9 (morbid obesity) ([place]) 08/21/2015   Morbid obesity ([place]) 08/21/2015   Coronary artery disease involving native coronary artery of native heart without angina pectoris 12/05/2014   Urinary urgency 10/15/2014   Spinal stenosis, lumbar  region, with neurogenic claudication 01/02/2014   Hyperlipidemia associated with type 2 diabetes mellitus ([place]) 10/25/2013   Hyperlipidemia, unspecified 10/25/2013   COLONIC POLYPS, ADENOMATOUS 02/25/2007   Diabetes mellitus type II, non insulin dependent ([place]) 02/25/2007   Gout 02/25/2007   Essential hypertension 02/25/2007   Cough variant asthma 02/25/2007   OSA on CPAP 02/25/2007   FATIGUE, CHRONIC 02/25/2007   HEADACHE, CHRONIC 02/25/2007   GERD (gastroesophageal reflux disease) 02/25/2007    Conditions to be addressed/monitored: monitor client management of medical needs faced. Monitor client mobility; Monitor client completion of ADLs  Care Plan : LCSW Care plan  Updates made by Katha Cabal, LCSW since 11/21/2020 12:00 AM     Problem: Coping Skills (General Plan of Care)      Goal: Manage Health needs and manage anxiety or stress issues related to health needs   Start Date: 11/21/2020  Expected End Date: 02/13/2021  This Visit's Progress: On track  Recent Progress: On track  Priority: Medium  Note:   Current barriers:   Patient in need of  assistance with connecting to community resources for possible assistance in managing his ongoing medical needs Patient is unable to independently navigate community resource options without care coordination support Mobility issues Pain issues Breathing challenges  Clinical Goals:  patient will work with SW in next 30 days to address concerns related to health issues of client and management of health issues of client Patient will call RNCM or LCSW as needed in next 30 days for CCM support Patient will attend all scheduled client medical appointments in next 30 days  Clinical Interventions:  Collaboration with [name], [name], MD regarding development and update of comprehensive plan of care as evidenced by provider attestation and co-signature Discussed with client the breathing issues of client (client said he uses inhaler as needed and uses nebulizer for treatments as prescribed) Encouraged client to talk with RNCM as needed for CCM nursing support Reviewed with client sleeping challenges of client. He said he uses Bi-Pap to help him sleep at night) He said his use of Bi-Pap was going well Reviewed with client his family support (has support from his wife and daughter, has support from his son; has support from his granddaughter) Reviewed with client the medication procurement of client Discussed with client support related to medications  with Dr. [name], [facility] Pharmacist Discussed with [name] his support from nephrologist Provided counseling support for client  Patient Coping Skills/Strengths: Has family support Has no transport issues Attends scheduled medical appointments Takes medications as prescribed Completes ADLs independently  Patient Deficits: Some mobility challenges Pain issues Breathing challenges  Patient Goals:  Patient will attend all scheduled client medical appointments in next 30 days Patient will call RNCM or LCSW as needed for CCM support in next  30 days Patient will communicate regularly with his spouse in next 30 days to discuss needs of client  Follow Up Plan:  LCSW to call client or his spouse on 01/14/21 at 10:00 AM to assess client needs     [name].Xxavier Noon MSW, LCSW Licensed Clinical Social Worker [facility] Care Management [phone]

## 2020-11-24 ENCOUNTER — Encounter: Payer: Self-pay | Admitting: Family Medicine

## 2020-11-24 ENCOUNTER — Telehealth: Payer: Self-pay | Admitting: Internal Medicine

## 2020-11-24 ENCOUNTER — Ambulatory Visit (INDEPENDENT_AMBULATORY_CARE_PROVIDER_SITE_OTHER): Payer: PPO | Admitting: Family Medicine

## 2020-11-24 ENCOUNTER — Other Ambulatory Visit: Payer: Self-pay

## 2020-11-24 VITALS — BP 149/85 | HR 84 | Wt 270.0 lb

## 2020-11-24 DIAGNOSIS — I129 Hypertensive chronic kidney disease with stage 1 through stage 4 chronic kidney disease, or unspecified chronic kidney disease: Secondary | ICD-10-CM | POA: Diagnosis not present

## 2020-11-24 DIAGNOSIS — J441 Chronic obstructive pulmonary disease with (acute) exacerbation: Secondary | ICD-10-CM | POA: Diagnosis not present

## 2020-11-24 DIAGNOSIS — E1122 Type 2 diabetes mellitus with diabetic chronic kidney disease: Secondary | ICD-10-CM | POA: Diagnosis not present

## 2020-11-24 DIAGNOSIS — E1129 Type 2 diabetes mellitus with other diabetic kidney complication: Secondary | ICD-10-CM | POA: Diagnosis not present

## 2020-11-24 DIAGNOSIS — J9611 Chronic respiratory failure with hypoxia: Secondary | ICD-10-CM | POA: Diagnosis not present

## 2020-11-24 DIAGNOSIS — R0602 Shortness of breath: Secondary | ICD-10-CM

## 2020-11-24 DIAGNOSIS — N189 Chronic kidney disease, unspecified: Secondary | ICD-10-CM | POA: Diagnosis not present

## 2020-11-24 DIAGNOSIS — I5042 Chronic combined systolic (congestive) and diastolic (congestive) heart failure: Secondary | ICD-10-CM | POA: Diagnosis not present

## 2020-11-24 DIAGNOSIS — R809 Proteinuria, unspecified: Secondary | ICD-10-CM | POA: Diagnosis not present

## 2020-11-24 MED ORDER — ROFLUMILAST 500 MCG PO TABS: 500.0000 ug | ORAL_TABLET | Freq: Every day | ORAL | 5 refills | Status: DC

## 2020-11-24 NOTE — Progress Notes (Signed)
BP (!) 149/85   Pulse 84   Wt 270 lb (122.5 kg)   SpO2 95%   BMI 42.29 kg/m    Subjective:   Patient ID: Kenneth Fuller, male    DOB: 06/18/1945, 75 y.o.   MRN: [id number]  HPI: Kenneth Fuller is a 75 y.o. male presenting on 11/24/2020 for COPD and Asthma   HPI COPD and asthma Patient has continued worsening COPD and asthma and he continues to have difficulty with breathing coughing and wheezing.  They put him back on his home oxygen at 2 L nasal cannula and he does feel somewhat better with that but it does not help significantly and now to where he can feel like himself.  He has been dealing with this for over a couple months and just really not feeling better.  He has done a couple different rounds of corticosteroids over the past year.  He continues to have shortness of breath and wheezing at baseline.  Relevant past medical, surgical, family and social history reviewed and updated as indicated. Interim medical history since our last visit reviewed. Allergies and medications reviewed and updated.  Review of Systems  Constitutional:  Negative for chills and fever.  HENT:  Positive for congestion, postnasal drip, rhinorrhea, sinus pressure, sneezing and sore throat. Negative for ear discharge, ear pain and voice change.   Eyes:  Negative for pain, discharge, redness and visual disturbance.  Respiratory:  Positive for cough, shortness of breath and wheezing.   Cardiovascular:  Negative for chest pain and leg swelling.  Musculoskeletal:  Negative for gait problem.  Skin:  Negative for rash.  All other systems reviewed and are negative.  Per HPI unless specifically indicated above   Allergies as of 11/24/2020       Reactions   Amlodipine Besy-benazepril Hcl Swelling, Other (See Comments)   Makes tongue swell (lotrel)   Hydrocodone Itching   Can tolerate in low doses   Oxycodone Itching   Phenergan [promethazine Hcl] Other (See Comments)   "I can't remember."         Medication List        Accurate as of November 24, 2020  2:00 PM. If you have any questions, ask your nurse or doctor.          albuterol 108 (90 Base) MCG/ACT inhaler Commonly known as: VENTOLIN HFA Inhale 2 puffs into the lungs every 6 (six) hours as needed for wheezing or shortness of breath.   albuterol (2.5 MG/3ML) 0.083% nebulizer solution Commonly known as: PROVENTIL Take 3 mLs (2.5 mg total) by nebulization every 6 (six) hours as needed for wheezing or shortness of breath.   amitriptyline 25 MG tablet Commonly known as: ELAVIL Take 25 mg by mouth at bedtime.   atorvastatin 40 MG tablet Commonly known as: LIPITOR Take 40 mg by mouth daily. 1 Tablet Daily   benzonatate 200 MG capsule Commonly known as: TESSALON Take 1 capsule (200 mg total) by mouth 2 (two) times daily as needed for cough.   bisoprolol 5 MG tablet Commonly known as: ZEBETA Take 1/2 (one-half) tablet by mouth once daily What changed: See the new instructions.   Breztri Aerosphere 160-9-4.8 MCG/ACT Aero Generic drug: Budeson-Glycopyrrol-Formoterol Inhale 2 puffs into the lungs in the morning and at bedtime.   cetirizine 10 MG tablet Commonly known as: ZYRTEC Take 1 tablet (10 mg total) by mouth daily.   chlorthalidone 25 MG tablet Commonly known as: HYGROTON Take by mouth.   clonazePAM  0.5 MG tablet Commonly known as: KLONOPIN Take by mouth.   cloNIDine 0.2 MG tablet Commonly known as: CATAPRES TAKE 1 TABLET BY MOUTH THREE TIMES DAILY   colchicine 0.6 MG tablet Take 1 tablet by mouth once daily   dapagliflozin propanediol 10 MG Tabs tablet Commonly known as: Farxiga Take 1 tablet (10 mg total) by mouth daily.   famotidine 20 MG tablet Commonly known as: PEPCID Take 1 tablet (20 mg total) by mouth at bedtime.   FeroSul 325 (65 FE) MG tablet Generic drug: ferrous sulfate Take 325 mg by mouth 3 (three) times daily.   fluticasone 50 MCG/ACT nasal spray Commonly known as:  FLONASE Place 2 sprays into both nostrils daily.   furosemide 40 MG tablet Commonly known as: LASIX Take 40 mg by mouth daily as needed (fluid retention.).   glimepiride 4 MG tablet Commonly known as: AMARYL Take by mouth.   hydrALAZINE 50 MG tablet Commonly known as: APRESOLINE Take 50 mg by mouth 3 (three) times daily.   hydrALAZINE 25 MG tablet Commonly known as: APRESOLINE TAKE 3 TABLETS BY MOUTH THREE TIMES DAILY   isosorbide dinitrate 20 MG tablet Commonly known as: ISORDIL Take 1 tablet (20 mg total) by mouth 3 (three) times daily.   lisinopril 20 MG tablet Commonly known as: ZESTRIL Take 1 tablet by mouth daily.   Myrbetriq 25 MG Tb24 tablet Generic drug: mirabegron ER Take 1 tablet by mouth once daily   nitroGLYCERIN 0.4 MG SL tablet Commonly known as: NITROSTAT Place 1 tablet (0.4 mg total) under the tongue every 5 (five) minutes x 3 doses as needed for chest pain.   nitroGLYCERIN 0.4 MG SL tablet Commonly known as: NITROSTAT Place under the tongue.   omeprazole 40 MG capsule Commonly known as: PRILOSEC TAKE 1 CAPSULE BY MOUTH ONCE DAILY 30 TO 60 MINUTES PRIOR TO BREAKFAST What changed: See the new instructions.   ondansetron 4 MG tablet Commonly known as: ZOFRAN Take by mouth.   OneTouch Ultra test strip Generic drug: glucose blood USE 1 STRIP TO CHECK GLUCOSE ONCE DAILY   polyethylene glycol 17 g packet Commonly known as: MIRALAX / GLYCOLAX Take 17 g by mouth as needed for mild constipation.   predniSONE 10 MG tablet Commonly known as: DELTASONE Days 1-4 take 4 tablets (40 mg) daily  Days 5-8 take 3 tablets (30 mg) daily, Days 9-11 take 2 tablets (20 mg) daily, Days 12-14 take 1 tablet (10 mg) daily.   Restasis 0.05 % ophthalmic emulsion Generic drug: cycloSPORINE Place 1 drop into both eyes in the morning and at bedtime.   cycloSPORINE 0.05 % ophthalmic emulsion Commonly known as: RESTASIS Apply to eye.   roflumilast 500 MCG Tabs  tablet Commonly known as: DALIRESP Take 1 tablet (500 mcg total) by mouth daily. Started by: Worthy Rancher, MD   sertraline 100 MG tablet Commonly known as: ZOLOFT Take 1 tablet by mouth daily.   sertraline 100 MG tablet Commonly known as: ZOLOFT Take 1 tablet (100 mg total) by mouth daily.   spironolactone 25 MG tablet Commonly known as: ALDACTONE Take by mouth.   tamsulosin 0.4 MG Caps capsule Commonly known as: FLOMAX Take 1 capsule (0.4 mg total) by mouth daily after supper.   Vitamin D3 125 MCG (5000 UT) Tabs Take 5,000 Units by mouth daily.         Objective:   BP (!) 149/85   Pulse 84   Wt 270 lb (122.5 kg)   SpO2 95%  BMI 42.29 kg/m   Wt Readings from Last 3 Encounters:  11/24/20 270 lb (122.5 kg)  11/20/20 276 lb (125.2 kg)  10/24/20 276 lb (125.2 kg)    Physical Exam Vitals and nursing note reviewed.  Constitutional:      General: He is not in acute distress.    Appearance: He is well-developed. He is not diaphoretic.  Eyes:     General: No scleral icterus.    Conjunctiva/sclera: Conjunctivae normal.  Neck:     Thyroid: No thyromegaly.  Cardiovascular:     Rate and Rhythm: Normal rate and regular rhythm.     Heart sounds: Normal heart sounds. No murmur heard. Pulmonary:     Effort: Pulmonary effort is normal. No respiratory distress.     Breath sounds: Wheezing and rhonchi present. No rales.  Chest:     Chest wall: No tenderness.  Musculoskeletal:        General: Normal range of motion.     Cervical back: Neck supple.  Lymphadenopathy:     Cervical: No cervical adenopathy.  Skin:    General: Skin is warm and dry.     Findings: No rash.  Neurological:     Mental Status: He is alert and oriented to person, place, and time.     Coordination: Coordination normal.  Psychiatric:        Behavior: Behavior normal.      Assessment & Plan:   Problem List Items Addressed This Visit       Respiratory   Chronic obstructive  pulmonary disease ([place]) - Primary   Relevant Medications   roflumilast (DALIRESP) 500 MCG TABS tablet   Chronic respiratory failure with hypoxia (HCC)   Relevant Medications   roflumilast (DALIRESP) 500 MCG TABS tablet     Other   SOB (shortness of breath)   Relevant Medications   roflumilast (DALIRESP) 500 MCG TABS tablet    Will add Daliresp to his regimen to see if he can help, continue the [place] and as needed medications.  Patient already has a pulmonologist that he sees for Follow up plan: Return if symptoms worsen or fail to improve, for 1 to 28-monthrecheck COPD.  Counseling provided for all of the vaccine components No orders of the defined types were placed in this encounter.   JCaryl Pina MD [facility] 11/24/2020, 2:00 PM

## 2020-11-24 NOTE — Telephone Encounter (Signed)
MR, please advise if you are okay with pt switching to Dr. Melvyn Novas and Dr. Melvyn Novas, please advise if you are okay taking over pt's care.

## 2020-11-25 NOTE — Telephone Encounter (Signed)
Ok, can see in Kenneth Fuller if easier for him - should bring all active meds,  solutions, inhalers to ov

## 2020-11-26 NOTE — Telephone Encounter (Signed)
His choice - I am in support of that He has copd. He does not have ILD. So no need to come to the ILD center in [place]

## 2020-11-26 NOTE — Telephone Encounter (Signed)
I have called the patient and he has made a follow up with Dr. Melvyn Novas on 01/15/21. No other concerns.

## 2020-11-26 NOTE — Progress Notes (Signed)
Received notification from [employer] regarding approval for [facility]. Patient assistance approved UNTIL 02/07/21.  AUTO RE-ENROLLMENT FOR 2023 WILL BEGIN AFTER 02/07/21 & LAST UNTIL 02/07/22.   Phone: [phone]

## 2020-11-27 DIAGNOSIS — I5032 Chronic diastolic (congestive) heart failure: Secondary | ICD-10-CM | POA: Diagnosis not present

## 2020-11-27 DIAGNOSIS — G4733 Obstructive sleep apnea (adult) (pediatric): Secondary | ICD-10-CM | POA: Diagnosis not present

## 2020-11-27 DIAGNOSIS — R0902 Hypoxemia: Secondary | ICD-10-CM | POA: Diagnosis not present

## 2020-11-27 MED ORDER — DAPAGLIFLOZIN PROPANEDIOL 10 MG PO TABS: 10.0000 mg | ORAL_TABLET | Freq: Every day | ORAL | 11 refills | Status: DC

## 2020-11-27 NOTE — Patient Instructions (Signed)
Visit Information  PATIENT GOALS:  Goals Addressed               This Visit's Progress     Patient Stated     T2DM-PHARMD GOAL (pt-stated)        Current Barriers:  Unable to independently afford treatment regimen Unable to maintain control of T2DM  Pharmacist Clinical Goal(s):  Over the next 90 days, patient will verbalize ability to afford treatment regimen maintain control of T2DM as evidenced by IMPROVED GLYCEMIC CONTROL  through collaboration with PharmD and provider.    Interventions: 1:1 collaboration with [name], [name], MD regarding development and update of comprehensive plan of care as evidenced by provider attestation and co-signature Inter-disciplinary care team collaboration (see longitudinal plan of care) Comprehensive medication review performed; medication list updated in electronic medical record  Diabetes: Uncontrolled-A1C 7.9%; current treatment: OZEMPIC 0.25MG SQ WEEKLY, FARXIGA 10MG DAILY;  CONTINUE Ozempic 0.105m sq weekly Denies personal and family history of Medullary thyroid cancer (MTC) Enrolled in [employer] patient assistance Increase Farxiga to 154mDAILY--patient stable Escribed to [employer] Received notification from [place]&[employer] regarding [facility] UNTIL 02/07/21.  AUTO RE-ENROLLMENT FOR 2023 WILL BEGIN AFTER 02/07/21 & LAST UNTIL 02/07/22. Need to determine if patient is still taking glimepiride** recommended to discontinue, but still on med list Current glucose readings: fasting glucose <200, post prandial glucose: N/A Denies hypoglycemic/hyperglycemic symptoms Discussed meal planning options and Plate method for healthy eating Avoid sugary drinks and desserts Incorporate balanced protein, non starchy veggies, 1 serving of carbohydrate with each meal Increase water intake Increase physical activity as able Current exercise: UNABLE DUE TO CURRENT STATE--BLOOD PRESSURE LABILE; ENERGY LEVEL  DECREASED Educated on medications-purpose & side effects Assessed patient finances. [facility]  They will no longer be servicing patient after this year and with certain medications. PLAN[facility] has enough Ozempic 0.2542mo last until we can reapply in November 2022.  We will be ordering patient's Breztri via [employer] assistance program--call placed to az&me to add breztri inhaler to patient's medications, [facility] for [facility]me patient assistance).  Hypertension: Uncontrolled- BP goal <130/80; current treatment: bisoprolol, clonidine, hydralazine, isosorbide Current home readings:  traditional automatic cuff (reports labile from 100s-170s SBP) wrist cuff: 140/82, 123/71; PCP in office today 123/71 Reports hypotensive/hypertensive symptoms Counseled on taking all BP medications as prescribed and at the same time of day to keep steady control; BP is managed by cardiology, therefore patient instructed to call cards for any BP needs; patient to keep strict log of BP times and readings   Patient Goals/Self-Care Activities Over the next 90 days, patient will:  - take medications as prescribed check glucose DAILY (FASTING) OR IF SYMPTOMATIC , document, and provide at future appointments check blood pressure DAILY OR IF SYMPTOMATIC, document, and provide at future appointments  Follow Up Plan: Telephone follow up appointment with care management team member scheduled for: 1 month         The patient verbalized understanding of instructions, educational materials, and care plan provided today and declined offer to receive copy of patient instructions, educational materials, and care plan.   Telephone follow up appointment with care management team member scheduled for: 01/2021  Signature JulRegina EckharmD, BCPS Clinical  Pharmacist, [facility] Phone [phone]

## 2020-11-27 NOTE — Progress Notes (Signed)
Chronic Care Management Pharmacy Note  11/14/2020 Name:  GLENDALE YOUNGBLOOD MRN:  [id number] DOB:  1945/12/10  Summary: T2DM  Recommendations/Changes made from today's visit:  Diabetes: Uncontrolled-A1C 7.9%; current treatment: OZEMPIC 0.25MG SQ WEEKLY, FARXIGA 10MG DAILY;  CONTINUE Ozempic 0.45m sq weekly Denies personal and family history of Medullary thyroid cancer (MTC) Enrolled in novo nordisk patient assistance Increase Farxiga to 164mDAILY--patient stable Escribed to med[employer]x Received notification from AZ&ME regarding approval for FAUtopiaPatient assistance approved UNTIL 02/07/21.  AUTO RE-ENROLLMENT FOR 2023 WILL BEGIN AFTER 02/07/21 & LAST UNTIL 02/07/22. Need to determine if patient is still taking glimepiride** recommended to discontinue, but still on med list Current glucose readings: fasting glucose <200, post prandial glucose: N/A Denies hypoglycemic/hyperglycemic symptoms Discussed meal planning options and Plate method for healthy eating Avoid sugary drinks and desserts Incorporate balanced protein, non starchy veggies, 1 serving of carbohydrate with each meal Increase water intake Increase physical activity as able Current exercise: UNABLE DUE TO CURRENT STATE--BLOOD PRESSURE LABILE; ENERGY LEVEL DECREASED Educated on medications-purpose & side effects Assessed patient finances. [facility]  They will no longer be servicing patient after this year and with certain medications. PLAN: [facility] Patient has enough Ozempic 0.2574mo last until we can reapply in November 2022.  We will be ordering patient's Breztri via AstTime Warn[employer]ient assistance program--call placed to az&me to add breztri inhaler to patient's medications, [facility] (pharmacy for az&me patient assistance).  Follow Up Plan: Telephone follow up appointment with care  management team member scheduled for: 1 month  Subjective: RobPARTH MCCORMAC an [age]o. year old male who is a primary patient of [name], JosFransisca KaufmannD.  The CCM team was consulted for assistance with disease management and care coordination needs.    Engaged with patient by telephone for follow up visit in response to provider referral for pharmacy case management and/or care coordination services.   Consent to Services:  The patient was given information about Chronic Care Management services, agreed to services, and gave verbal consent prior to initiation of services.  Please see initial visit note for detailed documentation.   Patient Care Team: [name], JosFransisca KaufmannD as PCP - General (Family Medicine) Croitoru, MihDani GobbleD as PCP - Cardiology (Cardiology) Le,Harlen LabsD as Referring Physician (Optometry) ForShea EvansicNorva RiffleCSW as Social Worker (Licensed Clinical Social Worker) HudCori RazorriDelice BisonN as Case Manager Croitoru, MihDani GobbleD as Consulting Physician (Cardiology) PruLavera GuisePHHamilton General Hospitalharmacist) BhuLiana GeroldD as Consulting Physician (Nephrology) JacMilus BanisterD as Attending Physician (Gastroenterology) AlvRigoberto NoelD as Consulting Physician (Pulmonary Disease)  Objective:  Lab Results  Component Value Date   CREATININE 1.12 10/24/2020   CREATININE 1.13 10/17/2020   CREATININE 1.25 07/14/2020    Lab Results  Component Value Date   HGBA1C 7.9 (H) 07/09/2020   Last diabetic Eye exam:  Lab Results  Component Value Date/Time   HMDIABEYEEXA No Retinopathy 03/23/2018 12:00 AM    Last diabetic Foot exam: No results found for: HMDIABFOOTEX      Component Value Date/Time   CHOL 90 (L) 10/04/2019 1349   CHOL 111 06/12/2012 1232   TRIG 142 10/04/2019 1349   TRIG 141 01/24/2014 1143   TRIG 192 (H) 06/12/2012 1232   HDL 40 10/04/2019 1349   HDL 38 (L) 01/24/2014 1143  HDL 33 (L) 06/12/2012 1232   CHOLHDL 2.3 10/04/2019 1349   CHOLHDL  4.8 11/22/2006 1712   VLDL 39 11/22/2006 1712   LDLCALC 26 10/04/2019 1349   LDLCALC 27 10/25/2013 1224   LDLCALC 40 06/12/2012 1232    Hepatic Function Latest Ref Rng & Units 10/17/2020 07/14/2020 07/10/2020  Total Protein 6.5 - 8.1 g/dL 6.6 6.8 7.6  Albumin 3.5 - 5.0 g/dL 3.8 4.6 4.2  AST 15 - 41 U/L _0 ALT 0 - 44 U/L 29 33 28  Alk Phosphatase 38 - 126 U/L 71 98 89  Total Bilirubin 0.3 - 1.2 mg/dL 0.6 0.5 0.7  Bilirubin, Direct 0.00 - 0.40 mg/dL - - -    Lab Results  Component Value Date/Time   TSH 3.30 02/25/2017 10:28 AM   TSH 2.300 08/21/2015 01:52 PM    CBC Latest Ref Rng & Units 10/17/2020 07/14/2020 07/10/2020  WBC 4.0 - 10.5 K/uL 7.5 16.7(H) 12.4(H)  Hemoglobin 13.0 - 17.0 g/dL 14.0 16.3 16.4  Hematocrit 39.0 - 52.0 % 43.5 48.5 51.2  Platelets 150 - 400 K/uL 211 250 211    No results found for: VD25OH  Clinical ASCVD: No  The ASCVD Risk score (Arnett DK, et al., 2019) failed to calculate for the following reasons:   The valid total cholesterol range is 130 to 320 mg/dL    Other: (CHADS2VASc if Afib, PHQ9 if depression, MMRC or CAT for COPD, ACT, DEXA)  Social History   Tobacco Use  Smoking Status Former   Packs/day: 1.00   Types: Cigarettes   Start date: 02/09/1964   Quit date: 02/08/1969   Years since quitting: 51.8  Smokeless Tobacco Former   Quit date: 1971   BP Readings from Last 3 Encounters:  11/24/20 (!) 149/85  11/20/20 (!) 154/87  10/27/20 (!) 199/112   Pulse Readings from Last 3 Encounters:  11/24/20 84  11/20/20 83  10/27/20 92   Wt Readings from Last 3 Encounters:  11/24/20 270 lb (122.5 kg)  11/20/20 276 lb (125.2 kg)  10/24/20 276 lb (125.2 kg)    Assessment: Review of patient past medical history, allergies, medications, health status, including review of consultants reports, laboratory and other test data, was performed as part of comprehensive evaluation and provision of chronic care management services.   SDOH:  (Social  Determinants of Health) assessments and interventions performed:    CCM Care Plan  Allergies  Allergen Reactions   Amlodipine Besy-Benazepril Hcl Swelling and Other (See Comments)    Makes tongue swell (lotrel)   Hydrocodone Itching    Can tolerate in low doses   Oxycodone Itching   Phenergan [Promethazine Hcl] Other (See Comments)    "I can't remember."    Medications Reviewed Today     Reviewed by [name], [facility] (Pharmacist) on 11/27/20 at 2125  Med List Status: <None>   Medication Order Taking? Sig Documenting Provider Last Dose Status Informant  albuterol (PROVENTIL) (2.5 MG/3ML) 0.083% nebulizer solution [id number] No Take 3 mLs (2.5 mg total) by nebulization every 6 (six) hours as needed for wheezing or shortness of breath. [name], [name], MD Taking Active Multiple Informants  albuterol (VENTOLIN HFA) 108 (90 Base) MCG/ACT inhaler [id number] No Inhale 2 puffs into the lungs every 6 (six) hours as needed for wheezing or shortness of breath. Claretta Fraise, MD Taking Active Multiple Informants  amitriptyline (ELAVIL) 25 MG tablet [id number] No Take 25 mg by mouth at bedtime. [provider] Taking Active Multiple  Informants  atorvastatin (LIPITOR) 40 MG tablet [id number] No Take 40 mg by mouth daily. 1 Tablet Daily [provider] Taking Active Multiple Informants  benzonatate (TESSALON) 200 MG capsule [id number] No Take 1 capsule (200 mg total) by mouth 2 (two) times daily as needed for cough. Sharion Balloon, FNP Taking Active   bisoprolol (ZEBETA) 5 MG tablet [id number] No Take 1/2 (one-half) tablet by mouth once daily  Patient taking differently: Take 2.5 mg by mouth daily.   [name], [name], MD Taking Active Self  Budeson-Glycopyrrol-Formoterol (BREZTRI AEROSPHERE) 160-9-4.8 MCG/ACT AERO [id number] No Inhale 2 puffs into the lungs in the morning and at bedtime. [name], [name], MD Taking Active Multiple Informants           Med Note Parthenia Ames Nov 27, 2020  9:25 PM) VIA AZ&ME PATIENT ASSISTANCE  cetirizine (ZYRTEC) 10 MG tablet [id number] No Take 1 tablet (10 mg total) by mouth daily. Sharion Balloon, FNP Taking Active   chlorthalidone (HYGROTON) 25 MG tablet [id number] No Take by mouth. [provider] Taking Active Multiple Informants  Cholecalciferol (VITAMIN D3) 125 MCG (5000 UT) TABS [id number] No Take 5,000 Units by mouth daily. [provider] Taking Active Multiple Informants  clonazePAM (KLONOPIN) 0.5 MG tablet [id number] No Take by mouth. [provider] Taking Active   cloNIDine (CATAPRES) 0.2 MG tablet [id number] No TAKE 1 TABLET BY MOUTH THREE TIMES DAILY [name], [name], MD Taking Active   colchicine 0.6 MG tablet [id number] No Take 1 tablet by mouth once daily [name], [name], MD Taking Active Multiple Informants  cycloSPORINE (RESTASIS) 0.05 % ophthalmic emulsion [id number] No Apply to eye. [provider] Taking Active Multiple Informants  dapagliflozin propanediol (FARXIGA) 10 MG TABS tablet [id number]  Take 1 tablet (10 mg total) by mouth daily. [name], [name], MD  Active            Med Note Blanca Friend, Sherian Maroon Nov 27, 2020  9:25 PM) VIA AZ&ME PATIENT ASSISTANCE   famotidine (PEPCID) 20 MG tablet [id number] No Take 1 tablet (20 mg total) by mouth at bedtime. Martyn Ehrich, NP Taking Active Multiple Informants  FEROSUL 325 (65 Fe) MG tablet [id number] No Take 325 mg by mouth 3 (three) times daily. [provider] Taking Active Multiple Informants  fluticasone (FLONASE) 50 MCG/ACT nasal spray [id number] No Place 2 sprays into both nostrils daily. Sharion Balloon, FNP Taking Active   furosemide (LASIX) 40 MG tablet [id number] No Take 40 mg by mouth daily as needed (fluid retention.). [provider] Taking Active Multiple Informants  glimepiride (AMARYL) 4 MG tablet [id number] No Take by mouth. [provider] Taking Active Multiple  Informants  hydrALAZINE (APRESOLINE) 25 MG tablet [id number] No TAKE 3 TABLETS BY MOUTH THREE TIMES DAILY Croitoru, Mihai, MD Taking Active   hydrALAZINE (APRESOLINE) 50 MG tablet [id number] No Take 50 mg by mouth 3 (three) times daily. [provider] Taking Active Multiple Informants  isosorbide dinitrate (ISORDIL) 20 MG tablet [id number] No Take 1 tablet (20 mg total) by mouth 3 (three) times daily. [name], [name], MD Taking Active Multiple Informants  lisinopril (ZESTRIL) 20 MG tablet [id number] No Take 1 tablet by mouth daily. [provider] Taking Active Multiple Informants  MYRBETRIQ 25 MG TB24 tablet [id number] No Take 1 tablet by mouth once daily [name], [name], MD Taking Active Multiple Informants  nitroGLYCERIN (NITROSTAT) 0.4 MG SL tablet [id number] No Place 1 tablet (  0.4 mg total) under the tongue every 5 (five) minutes x 3 doses as needed for chest pain. Manuella Ghazi, Pratik D, DO Taking Expired 07/07/20 2359            Med Note (DAVIS, SOPHIA A   Tue Jul 15, 2020  8:58 AM) On hand  nitroGLYCERIN (NITROSTAT) 0.4 MG SL tablet [id number] No Place under the tongue. [provider] Taking Active Multiple Informants  omeprazole (PRILOSEC) 40 MG capsule [id number] No TAKE 1 CAPSULE BY MOUTH ONCE DAILY 30 TO 60 MINUTES PRIOR TO BREAKFAST  Patient taking differently: Take 40 mg by mouth daily.   Milus Banister, MD Taking Active   ondansetron [facility]) 4 MG tablet [id number] No Take by mouth. [provider] Taking Active Multiple Informants  ONETOUCH ULTRA test strip [id number] No USE 1 STRIP TO CHECK GLUCOSE ONCE DAILY [name], [name], MD Taking Active Multiple Informants  polyethylene glycol (MIRALAX / GLYCOLAX) 17 g packet [id number] No Take 17 g by mouth as needed for mild constipation. [provider] Taking Active Multiple Informants  predniSONE (DELTASONE) 10 MG tablet [id number] No Days 1-4 take 4 tablets (40 mg) daily  Days 5-8 take 3 tablets (30  mg) daily, Days 9-11 take 2 tablets (20 mg) daily, Days 12-14 take 1 tablet (10 mg) daily. Sharion Balloon, FNP Taking Active   RESTASIS 0.05 % ophthalmic emulsion [id number] No Place 1 drop into both eyes in the morning and at bedtime. [provider] Taking Active Multiple Informants  roflumilast (DALIRESP) 500 MCG TABS tablet [id number]  Take 1 tablet (500 mcg total) by mouth daily. [name], [name], MD  Active   sertraline (ZOLOFT) 100 MG tablet [id number] No Take 1 tablet (100 mg total) by mouth daily. [name], [name], MD Taking Active Multiple Informants  sertraline (ZOLOFT) 100 MG tablet [id number] No Take 1 tablet by mouth daily. [provider] Taking Active Multiple Informants  spironolactone (ALDACTONE) 25 MG tablet [id number] No Take by mouth. [provider] Taking Active   tamsulosin (FLOMAX) 0.4 MG CAPS capsule [id number] No Take 1 capsule (0.4 mg total) by mouth daily after supper. [name], [name], MD Taking Active Multiple Informants           Med Note Tressie Ellis Dec 14, 2019  4:07 PM)              Patient Active Problem List   Diagnosis Date Noted   Chronic respiratory failure with hypoxia ([place]) 10/20/2020   Insomnia    CHF (congestive heart failure) ([place]) 01/06/2018   Depression with anxiety 01/04/2018   CKD (chronic kidney disease), stage III ([place]) 01/04/2018   Chronic obstructive pulmonary disease ([place]) 01/04/2018   Depression, recurrent ([place]) 11/11/2017   Chronic diastolic heart failure ([place]) 04/13/2017   SOB (shortness of breath) 01/26/2017   Swelling of lower extremity 11/30/2016   Obesity, Class III, BMI 40-49.9 (morbid obesity) ([place]) 08/21/2015   Morbid obesity ([place]) 08/21/2015   Coronary artery disease involving native coronary artery of native heart without angina pectoris 12/05/2014   Urinary urgency 10/15/2014   Spinal stenosis, lumbar region, with neurogenic claudication 01/02/2014   Hyperlipidemia  associated with type 2 diabetes mellitus ([place]) 10/25/2013   Hyperlipidemia, unspecified 10/25/2013   COLONIC POLYPS, ADENOMATOUS 02/25/2007   Diabetes mellitus type II, non insulin dependent ([place]) 02/25/2007   Gout 02/25/2007   Essential hypertension 02/25/2007   Cough variant asthma 02/25/2007   OSA on CPAP 02/25/2007   FATIGUE,  CHRONIC 02/25/2007   HEADACHE, CHRONIC 02/25/2007   GERD (gastroesophageal reflux disease) 02/25/2007    Immunization History  Administered Date(s) Administered   Fluad Quad(high Dose 65+) 11/15/2018   Influenza Whole 10/09/2008   Influenza, High Dose Seasonal PF 11/29/2016, 11/08/2017   Influenza,inj,Quad PF,6+ Mos 12/04/2013, 12/04/2014, 12/23/2015   Pneumococcal Conjugate-13 12/04/2013   Pneumococcal Polysaccharide-23 08/26/2011   Td 11/08/2017    Conditions to be addressed/monitored: HTN and DMII  Care Plan : PHARMD MEDICATION MANAGMENT  Updates made by [name], [place] since 11/27/2020 12:00 AM     Problem: DISEASE PREVENTION PROGRESSION      Long-Range Goal: T2DM, HTN   Recent Progress: Not on track  Priority: High  Note:   Current Barriers:  Unable to independently afford treatment regimen Unable to maintain control of T2DM  Pharmacist Clinical Goal(s):  Over the next 90 days, patient will verbalize ability to afford treatment regimen maintain control of T2DM as evidenced by IMPROVED GLYCEMIC CONTROL  through collaboration with PharmD and provider.    Interventions: 1:1 collaboration with [name], [name], MD regarding development and update of comprehensive plan of care as evidenced by provider attestation and co-signature Inter-disciplinary care team collaboration (see longitudinal plan of care) Comprehensive medication review performed; medication list updated in electronic medical record  Diabetes: Uncontrolled-A1C 7.9%; current treatment: OZEMPIC 0.25MG SQ WEEKLY, FARXIGA 10MG DAILY;  CONTINUE Ozempic 0.65m sq  weekly Denies personal and family history of Medullary thyroid cancer (MTC) Enrolled in novo nordisk patient assistance Increase Farxiga to 171mDAILY--patient stable Escribed to med[employer] Received notification from AZ&ME regarding approval for [facility]Patient assistance approved UNTIL 02/07/21.  AUTO RE-ENROLLMENT FOR 2023 WILL BEGIN AFTER 02/07/21 & LAST UNTIL 02/07/22. Need to determine if patient is still taking glimepiride** recommended to discontinue, but still on med list Current glucose readings: fasting glucose <200, post prandial glucose: N/A Denies hypoglycemic/hyperglycemic symptoms Discussed meal planning options and Plate method for healthy eating Avoid sugary drinks and desserts Incorporate balanced protein, non starchy veggies, 1 serving of carbohydrate with each meal Increase water intake Increase physical activity as able Current exercise: UNABLE DUE TO CURRENT STATE--BLOOD PRESSURE LABILE; ENERGY LEVEL DECREASED Educated on medications-purpose & side effects Assessed patient finances. [facility]  They will no longer be servicing patient after this year and with certain medications. PLAN[facility] Patient has enough Ozempic 0.2534mo last until we can reapply in November 2022.  We will be ordering patient's Breztri via Ast[employer] Warn[employer]ient assistance program--call placed to az&me to add breztri inhaler to patient's medications, [facility] (pharmacy for az&me patient assistance).  Hypertension: Uncontrolled- BP goal <130/80; current treatment: bisoprolol, clonidine, hydralazine, isosorbide Current home readings:  traditional automatic cuff (reports labile from 100s-170s SBP) wrist cuff: 140/82, 123/71; PCP in office today 123/71 Reports hypotensive/hypertensive symptoms Counseled on taking all BP medications as prescribed and at the same time of  day to keep steady control; BP is managed by cardiology, therefore patient instructed to call cards for any BP needs; patient to keep strict log of BP times and readings   Patient Goals/Self-Care Activities Over the next 90 days, patient will:  - take medications as prescribed check glucose DAILY (FASTING) OR IF SYMPTOMATIC , document, and provide at future appointments check blood pressure DAILY OR IF SYMPTOMATIC, document, and provide at future appointments  Follow Up Plan: Telephone follow up appointment with care  management team member scheduled for: 1 month      Medication Assistance:  Pilar Grammes obtained through [place]&ME medication assistance program.  Enrollment ends 02/07/21 OZEMPIC OBTAINED VIA NOVO [place] PATIENT ASSISTANCE PROGRAM UNTIL 02/07/21  Patient's preferred pharmacy is:  [facility] [address], [place] [address] Phone: [phone] Fax: [phone]   Follow Up:  Patient agrees to Care Plan and Follow-up.  Plan: Telephone follow up appointment with care management team member scheduled for:  01/2021     Regina Eck, PharmD, BCPS Clinical Pharmacist, [place]  II Phone [phone]

## 2020-11-29 DIAGNOSIS — J449 Chronic obstructive pulmonary disease, unspecified: Secondary | ICD-10-CM | POA: Diagnosis not present

## 2020-12-01 ENCOUNTER — Other Ambulatory Visit: Payer: Self-pay | Admitting: Family Medicine

## 2020-12-01 DIAGNOSIS — I10 Essential (primary) hypertension: Secondary | ICD-10-CM

## 2020-12-01 DIAGNOSIS — I5042 Chronic combined systolic (congestive) and diastolic (congestive) heart failure: Secondary | ICD-10-CM

## 2020-12-03 DIAGNOSIS — E1122 Type 2 diabetes mellitus with diabetic chronic kidney disease: Secondary | ICD-10-CM | POA: Diagnosis not present

## 2020-12-03 DIAGNOSIS — I5042 Chronic combined systolic (congestive) and diastolic (congestive) heart failure: Secondary | ICD-10-CM | POA: Diagnosis not present

## 2020-12-03 DIAGNOSIS — R809 Proteinuria, unspecified: Secondary | ICD-10-CM | POA: Diagnosis not present

## 2020-12-03 DIAGNOSIS — I129 Hypertensive chronic kidney disease with stage 1 through stage 4 chronic kidney disease, or unspecified chronic kidney disease: Secondary | ICD-10-CM | POA: Diagnosis not present

## 2020-12-03 DIAGNOSIS — E1129 Type 2 diabetes mellitus with other diabetic kidney complication: Secondary | ICD-10-CM | POA: Diagnosis not present

## 2020-12-03 DIAGNOSIS — N189 Chronic kidney disease, unspecified: Secondary | ICD-10-CM | POA: Diagnosis not present

## 2020-12-05 ENCOUNTER — Telehealth: Payer: PPO

## 2020-12-08 DIAGNOSIS — I10 Essential (primary) hypertension: Secondary | ICD-10-CM

## 2020-12-08 DIAGNOSIS — J441 Chronic obstructive pulmonary disease with (acute) exacerbation: Secondary | ICD-10-CM | POA: Diagnosis not present

## 2020-12-08 DIAGNOSIS — I2510 Atherosclerotic heart disease of native coronary artery without angina pectoris: Secondary | ICD-10-CM | POA: Diagnosis not present

## 2020-12-08 DIAGNOSIS — F339 Major depressive disorder, recurrent, unspecified: Secondary | ICD-10-CM

## 2020-12-08 DIAGNOSIS — I5042 Chronic combined systolic (congestive) and diastolic (congestive) heart failure: Secondary | ICD-10-CM

## 2020-12-08 DIAGNOSIS — E1142 Type 2 diabetes mellitus with diabetic polyneuropathy: Secondary | ICD-10-CM | POA: Diagnosis not present

## 2020-12-09 ENCOUNTER — Encounter: Payer: PPO | Admitting: *Deleted

## 2020-12-09 DIAGNOSIS — N3942 Incontinence without sensory awareness: Secondary | ICD-10-CM | POA: Diagnosis not present

## 2020-12-11 DIAGNOSIS — I5032 Chronic diastolic (congestive) heart failure: Secondary | ICD-10-CM | POA: Diagnosis not present

## 2020-12-11 DIAGNOSIS — R0902 Hypoxemia: Secondary | ICD-10-CM | POA: Diagnosis not present

## 2020-12-11 DIAGNOSIS — G4733 Obstructive sleep apnea (adult) (pediatric): Secondary | ICD-10-CM | POA: Diagnosis not present

## 2020-12-17 DIAGNOSIS — M542 Cervicalgia: Secondary | ICD-10-CM | POA: Diagnosis not present

## 2020-12-17 DIAGNOSIS — M545 Low back pain, unspecified: Secondary | ICD-10-CM | POA: Diagnosis not present

## 2020-12-24 ENCOUNTER — Telehealth: Payer: Self-pay | Admitting: Family Medicine

## 2020-12-24 NOTE — Telephone Encounter (Signed)
Pt has been made aware.

## 2020-12-24 NOTE — Telephone Encounter (Signed)
Pt called to let Dr [name] know that the [facility] 535mg Rx that was recently prescribed to him is working very well but says his insurance does not cover it and he is having to pay $80 per month for it.  Needs advise on if there is anything that can be done for insurance to cover or if there is something similar that can be prescribed that insurance will pay for?  Please advise and call patient.

## 2020-12-24 NOTE — Telephone Encounter (Signed)
Please let him know that I have made Almyra Free aware and she thinks that she can do the prescription assistance program that she has done for his other medicines through a a [place] and me but will take a month or 2.  If he can continue forward on it over the next couple months then he should get assistance hopefully within a month or so

## 2020-12-26 ENCOUNTER — Other Ambulatory Visit: Payer: Self-pay | Admitting: Cardiovascular Disease

## 2020-12-28 DIAGNOSIS — G4733 Obstructive sleep apnea (adult) (pediatric): Secondary | ICD-10-CM | POA: Diagnosis not present

## 2020-12-28 DIAGNOSIS — R0902 Hypoxemia: Secondary | ICD-10-CM | POA: Diagnosis not present

## 2020-12-28 DIAGNOSIS — I5032 Chronic diastolic (congestive) heart failure: Secondary | ICD-10-CM | POA: Diagnosis not present

## 2020-12-30 DIAGNOSIS — J449 Chronic obstructive pulmonary disease, unspecified: Secondary | ICD-10-CM | POA: Diagnosis not present

## 2021-01-05 ENCOUNTER — Encounter: Payer: Self-pay | Admitting: Family Medicine

## 2021-01-05 ENCOUNTER — Ambulatory Visit (INDEPENDENT_AMBULATORY_CARE_PROVIDER_SITE_OTHER): Payer: PPO | Admitting: Family Medicine

## 2021-01-05 ENCOUNTER — Ambulatory Visit (INDEPENDENT_AMBULATORY_CARE_PROVIDER_SITE_OTHER): Payer: PPO

## 2021-01-05 ENCOUNTER — Other Ambulatory Visit: Payer: Self-pay

## 2021-01-05 ENCOUNTER — Ambulatory Visit (HOSPITAL_COMMUNITY)
Admission: RE | Admit: 2021-01-05 | Discharge: 2021-01-05 | Disposition: A | Discharge status: Home / Self Care | Payer: PPO | Source: Ambulatory Visit | Attending: Family Medicine | Admitting: Family Medicine

## 2021-01-05 VITALS — BP 119/62 | HR 86 | Temp 97.8°F | Ht 67.0 in | Wt 270.0 lb

## 2021-01-05 DIAGNOSIS — R1031 Right lower quadrant pain: Secondary | ICD-10-CM | POA: Diagnosis not present

## 2021-01-05 DIAGNOSIS — N3090 Cystitis, unspecified without hematuria: Secondary | ICD-10-CM | POA: Diagnosis not present

## 2021-01-05 DIAGNOSIS — R109 Unspecified abdominal pain: Secondary | ICD-10-CM

## 2021-01-05 DIAGNOSIS — M5136 Other intervertebral disc degeneration, lumbar region: Secondary | ICD-10-CM | POA: Diagnosis not present

## 2021-01-05 DIAGNOSIS — N281 Cyst of kidney, acquired: Secondary | ICD-10-CM | POA: Diagnosis not present

## 2021-01-05 DIAGNOSIS — N200 Calculus of kidney: Secondary | ICD-10-CM | POA: Diagnosis not present

## 2021-01-05 DIAGNOSIS — D7389 Other diseases of spleen: Secondary | ICD-10-CM | POA: Diagnosis not present

## 2021-01-05 LAB — URINALYSIS, COMPLETE
Bilirubin, UA: NEGATIVE
Ketones, UA: NEGATIVE
Leukocytes,UA: NEGATIVE
Nitrite, UA: NEGATIVE
RBC, UA: NEGATIVE
Specific Gravity, UA: 1.01 (ref 1.005–1.030)
Urobilinogen, Ur: 0.2 mg/dL (ref 0.2–1.0)
pH, UA: 6 (ref 5.0–7.5)

## 2021-01-05 LAB — MICROSCOPIC EXAMINATION
Bacteria, UA: NONE SEEN
Renal Epithel, UA: NONE SEEN /hpf

## 2021-01-05 MED ORDER — KETOROLAC TROMETHAMINE 60 MG/2ML IM SOLN
60.0000 mg | Freq: Once | INTRAMUSCULAR | Status: AC
  Administered 2021-01-05: 10:00:00 60 mg via INTRAMUSCULAR

## 2021-01-05 MED ORDER — TRAMADOL HCL 50 MG PO TABS: 50.0000 mg | ORAL_TABLET | Freq: Four times a day (QID) | ORAL | 0 refills | Status: AC

## 2021-01-05 NOTE — Progress Notes (Addendum)
Subjective:  Patient ID: Kenneth Fuller, male    DOB: 03-01-1945  Age: 75 y.o. MRN: [id number]  CC: Flank Pain   HPI Trasean Delima Weir presents for sharp right flank pain to 8/10 intermittently. NKI. Onset 7-10 days ago. Can worsen with certain movements. Hx of kidney stones. LGF for 3 days at onset.   Depression screen [facility] 2/9 01/05/2021 01/05/2021 11/24/2020  Decreased Interest 0 0 1  Down, Depressed, Hopeless 0 0 1  PHQ - 2 Score 0 0 2  Altered sleeping 2 - 1  Tired, decreased energy 1 - 2  Change in appetite 0 - 0  Feeling bad or failure about yourself  0 - 0  Trouble concentrating 0 - 1  Moving slowly or fidgety/restless 0 - 0  Suicidal thoughts 0 - 0  PHQ-9 Score 3 - 6  Difficult doing work/chores Not difficult at all - -  Some recent data might be hidden    History Vernis has a past medical history of Allergy, Asthma, CAD (coronary artery disease), CHF (congestive heart failure) ([place]), COPD (chronic obstructive pulmonary disease) ([place]), Diabetes mellitus, Fibromyalgia, GERD (gastroesophageal reflux disease), GI bleeding, Gout, Hyperlipidemia, Hypertension, Hypogonadism male, Insomnia, MRSA cellulitis, Neuropathy, Obesity, Shortness of breath dyspnea, Sleep apnea, and Wheezing.   He has a past surgical history that includes neck fusion; Back surgery; Colonoscopy; Cardiac catheterization; Lumbar laminectomy/decompression microdiscectomy (N/A, 01/02/2014); LEFT HEART CATH AND CORONARY ANGIOGRAPHY (N/A, 12/02/2016); Esophagogastroduodenoscopy (egd) with propofol (N/A, 12/20/2019); Colonoscopy with propofol (N/A, 12/20/2019); biopsy (12/20/2019); and polypectomy (12/20/2019).   His family history includes Colon cancer in his mother; Deep vein thrombosis in his brother; Diabetes in his father; Drug abuse in his sister; Heart attack in his father; Heart attack (age of onset: 28) in his son; Heart disease in his brother, brother, and father; Heart failure in his sister; Kidney  disease in his sister.He reports that he quit smoking about 51 years ago. His smoking use included cigarettes. He started smoking about 56 years ago. He smoked an average of 1 pack per day. He quit smokeless tobacco use about 51 years ago. He reports that he does not drink alcohol and does not use drugs.    ROS Review of Systems  Constitutional:  Negative for fever.  Respiratory:  Negative for shortness of breath.   Cardiovascular:  Negative for chest pain.  Genitourinary:  Positive for flank pain.  Musculoskeletal:  Negative for arthralgias.  Skin:  Negative for rash.   Objective:  BP 119/62   Pulse 86   Temp 97.8 F (36.6 C)   Ht _0  (1.702 m)   Wt 270 lb (122.5 kg)   SpO2 94%   BMI 42.29 kg/m   BP Readings from Last 3 Encounters:  01/05/21 119/62  11/24/20 (!) 149/85  11/20/20 (!) 154/87    Wt Readings from Last 3 Encounters:  01/05/21 270 lb (122.5 kg)  11/24/20 270 lb (122.5 kg)  11/20/20 276 lb (125.2 kg)     Physical Exam Vitals reviewed.  Constitutional:      Appearance: He is well-developed.  HENT:     Head: Normocephalic and atraumatic.     Right Ear: External ear normal.     Left Ear: External ear normal.     Mouth/Throat:     Pharynx: No oropharyngeal exudate or posterior oropharyngeal erythema.  Eyes:     Pupils: Pupils are equal, round, and reactive to light.  Cardiovascular:     Rate and Rhythm: Normal rate and  regular rhythm.     Heart sounds: No murmur heard. Pulmonary:     Effort: No respiratory distress.     Breath sounds: Normal breath sounds.  Abdominal:     Tenderness: There is abdominal tenderness (RLQ and right flank at poterior axilary line).  Musculoskeletal:     Cervical back: Normal range of motion and neck supple.  Neurological:     Mental Status: He is alert and oriented to person, place, and time.      Assessment & Plan:   Manav was seen today for flank pain.  Diagnoses and all orders for this visit:  Cystitis -      Urinalysis, Complete -     Urine Culture -     Microscopic Examination  Flank pain -     DG Abd 2 Views; Future -     ketorolac (TORADOL) injection 60 mg -     Microscopic Examination  Right lower quadrant abdominal pain -     CT RENAL STONE STUDY; Future -     Microscopic Examination  Other orders -     traMADol (ULTRAM) 50 MG tablet; Take 1 tablet (50 mg total) by mouth 4 (four) times daily for 5 days. 1-2 tablets up to 4 times a day as needed for pain      I am having [name]. Rowles "Donnie" start on traMADol. I am also having him maintain his FeroSul, tamsulosin, furosemide, Vitamin D3, Restasis, atorvastatin, polyethylene glycol, albuterol, famotidine, nitroGLYCERIN, albuterol, OneTouch Ultra, isosorbide dinitrate, Myrbetriq, sertraline, Breztri Aerosphere, omeprazole, colchicine, cloNIDine, spironolactone, sertraline, ondansetron, nitroGLYCERIN, lisinopril, glimepiride, cycloSPORINE, clonazePAM, chlorthalidone, amitriptyline, hydrALAZINE, benzonatate, predniSONE, cetirizine, fluticasone, roflumilast, dapagliflozin propanediol, bisoprolol, and hydrALAZINE. We administered ketorolac.  Allergies as of 01/05/2021       Reactions   Amlodipine Besy-benazepril Hcl Swelling, Other (See Comments)   Makes tongue swell (lotrel)   Hydrocodone Itching   Can tolerate in low doses   Oxycodone Itching   Phenergan [promethazine Hcl] Other (See Comments)   "I can't remember."        Medication List        Accurate as of January 05, 2021  5:52 PM. If you have any questions, ask your nurse or doctor.          albuterol 108 (90 Base) MCG/ACT inhaler Commonly known as: VENTOLIN HFA Inhale 2 puffs into the lungs every 6 (six) hours as needed for wheezing or shortness of breath.   albuterol (2.5 MG/3ML) 0.083% nebulizer solution Commonly known as: PROVENTIL Take 3 mLs (2.5 mg total) by nebulization every 6 (six) hours as needed for wheezing or shortness of breath.    amitriptyline 25 MG tablet Commonly known as: ELAVIL Take 25 mg by mouth at bedtime.   atorvastatin 40 MG tablet Commonly known as: LIPITOR Take 40 mg by mouth daily. 1 Tablet Daily   benzonatate 200 MG capsule Commonly known as: TESSALON Take 1 capsule (200 mg total) by mouth 2 (two) times daily as needed for cough.   bisoprolol 5 MG tablet Commonly known as: ZEBETA Take 1/2 (one-half) tablet by mouth once daily   Breztri Aerosphere 160-9-4.8 MCG/ACT Aero Generic drug: Budeson-Glycopyrrol-Formoterol Inhale 2 puffs into the lungs in the morning and at bedtime.   cetirizine 10 MG tablet Commonly known as: ZYRTEC Take 1 tablet (10 mg total) by mouth daily.   chlorthalidone 25 MG tablet Commonly known as: HYGROTON Take by mouth.   clonazePAM 0.5 MG tablet Commonly known as: KLONOPIN Take by  mouth.   cloNIDine 0.2 MG tablet Commonly known as: CATAPRES TAKE 1 TABLET BY MOUTH THREE TIMES DAILY   colchicine 0.6 MG tablet Take 1 tablet by mouth once daily   dapagliflozin propanediol 10 MG Tabs tablet Commonly known as: Farxiga Take 1 tablet (10 mg total) by mouth daily.   famotidine 20 MG tablet Commonly known as: PEPCID Take 1 tablet (20 mg total) by mouth at bedtime.   FeroSul 325 (65 FE) MG tablet Generic drug: ferrous sulfate Take 325 mg by mouth 3 (three) times daily.   fluticasone 50 MCG/ACT nasal spray Commonly known as: FLONASE Place 2 sprays into both nostrils daily.   furosemide 40 MG tablet Commonly known as: LASIX Take 40 mg by mouth daily as needed (fluid retention.).   glimepiride 4 MG tablet Commonly known as: AMARYL Take by mouth.   hydrALAZINE 25 MG tablet Commonly known as: APRESOLINE TAKE 3 TABLETS BY MOUTH THREE TIMES DAILY   hydrALAZINE 50 MG tablet Commonly known as: APRESOLINE TAKE 1 TABLET BY MOUTH THREE TIMES DAILY   isosorbide dinitrate 20 MG tablet Commonly known as: ISORDIL Take 1 tablet (20 mg total) by mouth 3 (three)  times daily.   lisinopril 20 MG tablet Commonly known as: ZESTRIL Take 1 tablet by mouth daily.   Myrbetriq 25 MG Tb24 tablet Generic drug: mirabegron ER Take 1 tablet by mouth once daily   nitroGLYCERIN 0.4 MG SL tablet Commonly known as: NITROSTAT Place 1 tablet (0.4 mg total) under the tongue every 5 (five) minutes x 3 doses as needed for chest pain.   nitroGLYCERIN 0.4 MG SL tablet Commonly known as: NITROSTAT Place under the tongue.   omeprazole 40 MG capsule Commonly known as: PRILOSEC TAKE 1 CAPSULE BY MOUTH ONCE DAILY 30 TO 60 MINUTES PRIOR TO BREAKFAST What changed: See the new instructions.   ondansetron 4 MG tablet Commonly known as: ZOFRAN Take by mouth.   OneTouch Ultra test strip Generic drug: glucose blood USE 1 STRIP TO CHECK GLUCOSE ONCE DAILY   polyethylene glycol 17 g packet Commonly known as: MIRALAX / GLYCOLAX Take 17 g by mouth as needed for mild constipation.   predniSONE 10 MG tablet Commonly known as: DELTASONE Days 1-4 take 4 tablets (40 mg) daily  Days 5-8 take 3 tablets (30 mg) daily, Days 9-11 take 2 tablets (20 mg) daily, Days 12-14 take 1 tablet (10 mg) daily.   Restasis 0.05 % ophthalmic emulsion Generic drug: cycloSPORINE Place 1 drop into both eyes in the morning and at bedtime.   cycloSPORINE 0.05 % ophthalmic emulsion Commonly known as: RESTASIS Apply to eye.   roflumilast 500 MCG Tabs tablet Commonly known as: DALIRESP Take 1 tablet (500 mcg total) by mouth daily.   sertraline 100 MG tablet Commonly known as: ZOLOFT Take 1 tablet by mouth daily.   sertraline 100 MG tablet Commonly known as: ZOLOFT Take 1 tablet (100 mg total) by mouth daily.   spironolactone 25 MG tablet Commonly known as: ALDACTONE Take by mouth.   tamsulosin 0.4 MG Caps capsule Commonly known as: FLOMAX Take 1 capsule (0.4 mg total) by mouth daily after supper.   traMADol 50 MG tablet Commonly known as: ULTRAM Take 1 tablet (50 mg total) by  mouth 4 (four) times daily for 5 days. 1-2 tablets up to 4 times a day as needed for pain Started by: Claretta Fraise, MD   Vitamin D3 125 MCG (5000 UT) Tabs Take 5,000 Units by mouth daily.  Follow-up: Return if symptoms worsen or fail to improve.  Claretta Fraise, M.D.

## 2021-01-05 NOTE — Addendum Note (Signed)
Addended by: Claretta Fraise on: 01/05/2021 05:52 PM   Modules accepted: Orders

## 2021-01-07 LAB — URINE CULTURE

## 2021-01-12 ENCOUNTER — Encounter: Payer: Self-pay | Admitting: *Deleted

## 2021-01-14 ENCOUNTER — Ambulatory Visit: Payer: PPO | Admitting: Licensed Clinical Social Worker

## 2021-01-14 ENCOUNTER — Ambulatory Visit: Payer: PPO | Admitting: Pharmacist

## 2021-01-14 DIAGNOSIS — F418 Other specified anxiety disorders: Secondary | ICD-10-CM

## 2021-01-14 DIAGNOSIS — I10 Essential (primary) hypertension: Secondary | ICD-10-CM

## 2021-01-14 DIAGNOSIS — I2510 Atherosclerotic heart disease of native coronary artery without angina pectoris: Secondary | ICD-10-CM

## 2021-01-14 DIAGNOSIS — F339 Major depressive disorder, recurrent, unspecified: Secondary | ICD-10-CM

## 2021-01-14 DIAGNOSIS — E1142 Type 2 diabetes mellitus with diabetic polyneuropathy: Secondary | ICD-10-CM

## 2021-01-14 DIAGNOSIS — I5042 Chronic combined systolic (congestive) and diastolic (congestive) heart failure: Secondary | ICD-10-CM

## 2021-01-14 DIAGNOSIS — E119 Type 2 diabetes mellitus without complications: Secondary | ICD-10-CM

## 2021-01-14 DIAGNOSIS — N1832 Chronic kidney disease, stage 3b: Secondary | ICD-10-CM

## 2021-01-14 DIAGNOSIS — J441 Chronic obstructive pulmonary disease with (acute) exacerbation: Secondary | ICD-10-CM

## 2021-01-14 NOTE — Patient Instructions (Addendum)
Visit Information  Patient Goals:  Protect My Health (Patient). Manage Health issues of client. Manage anxiety issues of client  Timeframe:  Short Term Goal Priority:  Medium Progress; On Track  Start Date:  01/14/21                              Expected End Date:  04/09/21                 Follow Up Date 03/12/21 at 2:00 PM   Protect My Health (Patient)  Manage Health issues of client; manage anxiety issues of client    Why is this important?   Screening tests can find diseases early when they are easier to treat.  Your doctor or nurse will talk with you about which tests are important for you.  Getting shots for common diseases like the flu and shingles will help prevent them.     Patient Coping Skills/Strengths: Has family support Has no transport issues Attends scheduled medical appointments Takes medications as prescribed Completes ADLs independently  Patient Deficits: Some mobility challenges Pain issues Breathing challenges  Patient Goals:  Patient will attend all scheduled client medical appointments in next 30 days Patient will call RNCM or LCSW as needed for CCM support in next 30 days Patient will communicate regularly with his spouse in next 30 days to discuss needs of client  Follow Up Plan:  LCSW to call client or his spouse on 03/12/21 at 2:00 PM to assess needs of client  [name].Janin Kozlowski MSW, LCSW Licensed Clinical Social Worker [facility] Care Management [phone]

## 2021-01-14 NOTE — Chronic Care Management (AMB) (Signed)
Chronic Care Management    Clinical Social Work Note  01/14/2021 Name: Kenneth Fuller MRN: [id number] DOB: 01-07-1946  Kenneth Fuller is a 75 y.o. year old male who is a primary care patient of [name], Kenneth Kaufmann, MD. The CCM team was consulted to assist the patient with chronic disease management and/or care coordination needs related to: [name] .   Engaged with patient by telephone for follow up visit in response to provider referral for social work chronic care management and care coordination services.   Consent to Services:  The patient was given information about Chronic Care Management services, agreed to services, and gave verbal consent prior to initiation of services.  Please see initial visit note for detailed documentation.   Patient agreed to services and consent obtained.   Assessment: Review of patient past medical history, allergies, medications, and health status, including review of relevant consultants reports was performed today as part of a comprehensive evaluation and provision of chronic care management and care coordination services.     SDOH (Social Determinants of Health) assessments and interventions performed:  SDOH Interventions    Flowsheet Row Most Recent Value  SDOH Interventions   Physical Activity Interventions Other (Comments)  [walking challenges. Client gets fatigued or occasionally short of breath when walking]  Stress Interventions Provide Counseling  [client has stress related to managing medical needs]  Depression Interventions/Treatment  Currently on Treatment        Advanced Directives Status: See Vynca application for related entries.  CCM Care Plan  Allergies  Allergen Reactions   Amlodipine Besy-Benazepril Hcl Swelling and Other (See Comments)    Makes tongue swell (lotrel)   Hydrocodone Itching    Can tolerate in low doses   Oxycodone Itching   Phenergan [Promethazine Hcl] Other (See Comments)    "I can't  remember."    Outpatient Encounter Medications as of 01/14/2021  Medication Sig Note   albuterol (PROVENTIL) (2.5 MG/3ML) 0.083% nebulizer solution Take 3 mLs (2.5 mg total) by nebulization every 6 (six) hours as needed for wheezing or shortness of breath.    albuterol (VENTOLIN HFA) 108 (90 Base) MCG/ACT inhaler Inhale 2 puffs into the lungs every 6 (six) hours as needed for wheezing or shortness of breath.    amitriptyline (ELAVIL) 25 MG tablet Take 25 mg by mouth at bedtime.    atorvastatin (LIPITOR) 40 MG tablet Take 40 mg by mouth daily. 1 Tablet Daily    benzonatate (TESSALON) 200 MG capsule Take 1 capsule (200 mg total) by mouth 2 (two) times daily as needed for cough.    bisoprolol (ZEBETA) 5 MG tablet Take 1/2 (one-half) tablet by mouth once daily    Budeson-Glycopyrrol-Formoterol (BREZTRI AEROSPHERE) 160-9-4.8 MCG/ACT AERO Inhale 2 puffs into the lungs in the morning and at bedtime. 11/27/2020: VIA [facility]ME PATIENT ASSISTANCE   cetirizine (ZYRTEC) 10 MG tablet Take 1 tablet (10 mg total) by mouth daily.    chlorthalidone (HYGROTON) 25 MG tablet Take by mouth.    Cholecalciferol (VITAMIN D3) 125 MCG (5000 UT) TABS Take 5,000 Units by mouth daily.    clonazePAM (KLONOPIN) 0.5 MG tablet Take by mouth.    cloNIDine (CATAPRES) 0.2 MG tablet TAKE 1 TABLET BY MOUTH THREE TIMES DAILY    colchicine 0.6 MG tablet Take 1 tablet by mouth once daily    cycloSPORINE (RESTASIS) 0.05 % ophthalmic emulsion Apply to eye.    dapagliflozin propanediol (FARXIGA) 10 MG TABS tablet Take 1 tablet (10 mg total) by mouth  daily. 11/27/2020: VIA AZ&ME PATIENT ASSISTANCE    famotidine (PEPCID) 20 MG tablet Take 1 tablet (20 mg total) by mouth at bedtime.    FEROSUL 325 (65 Fe) MG tablet Take 325 mg by mouth 3 (three) times daily.    fluticasone (FLONASE) 50 MCG/ACT nasal spray Place 2 sprays into both nostrils daily.    furosemide (LASIX) 40 MG tablet Take 40 mg by mouth daily as needed (fluid retention.).     glimepiride (AMARYL) 4 MG tablet Take by mouth.    hydrALAZINE (APRESOLINE) 25 MG tablet TAKE 3 TABLETS BY MOUTH THREE TIMES DAILY    hydrALAZINE (APRESOLINE) 50 MG tablet TAKE 1 TABLET BY MOUTH THREE TIMES DAILY    isosorbide dinitrate (ISORDIL) 20 MG tablet Take 1 tablet (20 mg total) by mouth 3 (three) times daily.    lisinopril (ZESTRIL) 20 MG tablet Take 1 tablet by mouth daily.    MYRBETRIQ 25 MG TB24 tablet Take 1 tablet by mouth once daily    nitroGLYCERIN (NITROSTAT) 0.4 MG SL tablet Place 1 tablet (0.4 mg total) under the tongue every 5 (five) minutes x 3 doses as needed for chest pain. 07/15/2020: On hand   nitroGLYCERIN (NITROSTAT) 0.4 MG SL tablet Place under the tongue.    omeprazole (PRILOSEC) 40 MG capsule TAKE 1 CAPSULE BY MOUTH ONCE DAILY 30 TO 60 MINUTES PRIOR TO BREAKFAST (Patient taking differently: Take 40 mg by mouth daily.)    ondansetron (ZOFRAN) 4 MG tablet Take by mouth.    ONETOUCH ULTRA test strip USE 1 STRIP TO CHECK GLUCOSE ONCE DAILY    polyethylene glycol (MIRALAX / GLYCOLAX) 17 g packet Take 17 g by mouth as needed for mild constipation.    predniSONE (DELTASONE) 10 MG tablet Days 1-4 take 4 tablets (40 mg) daily  Days 5-8 take 3 tablets (30 mg) daily, Days 9-11 take 2 tablets (20 mg) daily, Days 12-14 take 1 tablet (10 mg) daily.    RESTASIS 0.05 % ophthalmic emulsion Place 1 drop into both eyes in the morning and at bedtime.    roflumilast (DALIRESP) 500 MCG TABS tablet Take 1 tablet (500 mcg total) by mouth daily.    sertraline (ZOLOFT) 100 MG tablet Take 1 tablet (100 mg total) by mouth daily.    sertraline (ZOLOFT) 100 MG tablet Take 1 tablet by mouth daily.    spironolactone (ALDACTONE) 25 MG tablet Take by mouth.    tamsulosin (FLOMAX) 0.4 MG CAPS capsule Take 1 capsule (0.4 mg total) by mouth daily after supper.    No facility-administered encounter medications on file as of 01/14/2021.    Patient Active Problem List   Diagnosis Date Noted   Chronic  respiratory failure with hypoxia ([place]) 10/20/2020   Insomnia    CHF (congestive heart failure) ([place]) 01/06/2018   Depression with anxiety 01/04/2018   CKD (chronic kidney disease), stage III ([place]) 01/04/2018   Chronic obstructive pulmonary disease ([place]) 01/04/2018   Depression, recurrent ([place]) 11/11/2017   Chronic diastolic heart failure ([place]) 04/13/2017   SOB (shortness of breath) 01/26/2017   Swelling of lower extremity 11/30/2016   Obesity, Class III, BMI 40-49.9 (morbid obesity) ([place]) 08/21/2015   Morbid obesity ([place]) 08/21/2015   Coronary artery disease involving native coronary artery of native heart without angina pectoris 12/05/2014   Urinary urgency 10/15/2014   Spinal stenosis, lumbar region, with neurogenic claudication 01/02/2014   Hyperlipidemia associated with type 2 diabetes mellitus ([place]) 10/25/2013   Hyperlipidemia, unspecified 10/25/2013   COLONIC POLYPS,  ADENOMATOUS 02/25/2007   Diabetes mellitus type II, non insulin dependent ([place]) 02/25/2007   Gout 02/25/2007   Essential hypertension 02/25/2007   Cough variant asthma 02/25/2007   OSA on CPAP 02/25/2007   FATIGUE, CHRONIC 02/25/2007   HEADACHE, CHRONIC 02/25/2007   GERD (gastroesophageal reflux disease) 02/25/2007    Conditions to be addressed/monitored: monitor client management of health needs faced  Care Plan : LCSW Care plan  Updates made by Katha Cabal, LCSW since 01/14/2021 12:00 AM     Problem: Coping Skills (General Plan of Care)      Goal: Manage Health needs and manage anxiety or stress issues related to health needs   Start Date: 01/14/2021  Expected End Date: 04/10/2021  This Visit's Progress: On track  Recent Progress: On track  Priority: Medium  Note:   Current barriers:   Patient in need of assistance with connecting to community resources for possible assistance in managing his ongoing medical needs Patient is unable to independently navigate community resource options without care  coordination support Mobility issues Pain issues Breathing challenges  Clinical Goals:  patient will work with SW in next 30 days to address concerns related to health issues of client and management of health issues of client Patient will call RNCM or LCSW as needed in next 30 days for CCM support Patient will attend all scheduled client medical appointments in next 30 days  Clinical Interventions:  Collaboration with [name], Kenneth Kaufmann, MD regarding development and update of comprehensive plan of care as evidenced by provider attestation and co-signature Discussed with client the breathing issues of client (client said he uses inhaler as needed and uses nebulizer for treatments as prescribed. Client said he uses oxygen at night to help him breath better) Encouraged client to talk with [facility] as needed for CCM nursing support Reviewed with client sleeping challenges of client. He said he uses Bi-Pap to help him sleep at night.  Reviewed with client his family support (has support from his wife and daughter, has support from his son; has support from his granddaughter) Reviewed with client the medication procurement of client. Client said he has been talking with Dr. [name], [facility] Pharmacist, related to medication costs of client Discussed with Kenneth Fuller his support from nephrologist Reviewed mood status of client. Khaleb said he is taking medications as prescribed.  He said he thought his mood was stable. He did not mention any mood problems. He has said that he has strong family support and that this is very helpful to him. Reviewed support of Pulmonologist. He has appointment tomorrow with Dr. Melvyn Novas, Pulmonologist Discussed client ambulation. He said he sometimes gets short of breath when walking Discussed ADLs completion of client  Patient Coping Skills/Strengths: Has family support Has no transport issues Attends scheduled medical appointments Takes medications as  prescribed Completes ADLs independently  Patient Deficits: Some mobility challenges Pain issues Breathing challenges  Patient Goals:  Patient will attend all scheduled client medical appointments in next 30 days Patient will call RNCM or LCSW as needed for CCM support in next 30 days Patient will communicate regularly with his spouse in next 30 days to discuss needs of client  Follow Up Plan:  LCSW to call client or his spouse on 03/12/21 at 2:00 PM to assess client needs      [name].Irie Fiorello MSW, LCSW Licensed Clinical Social Worker [facility] Care Management [phone]

## 2021-01-15 ENCOUNTER — Other Ambulatory Visit: Payer: Self-pay

## 2021-01-15 ENCOUNTER — Encounter: Payer: Self-pay | Admitting: Internal Medicine

## 2021-01-15 ENCOUNTER — Ambulatory Visit (INDEPENDENT_AMBULATORY_CARE_PROVIDER_SITE_OTHER): Payer: PPO | Admitting: Internal Medicine

## 2021-01-15 DIAGNOSIS — J449 Chronic obstructive pulmonary disease, unspecified: Secondary | ICD-10-CM | POA: Diagnosis not present

## 2021-01-15 DIAGNOSIS — Z9989 Dependence on other enabling machines and devices: Secondary | ICD-10-CM

## 2021-01-15 DIAGNOSIS — J45991 Cough variant asthma: Secondary | ICD-10-CM | POA: Diagnosis not present

## 2021-01-15 DIAGNOSIS — I10 Essential (primary) hypertension: Secondary | ICD-10-CM | POA: Diagnosis not present

## 2021-01-15 DIAGNOSIS — G4733 Obstructive sleep apnea (adult) (pediatric): Secondary | ICD-10-CM

## 2021-01-15 MED ORDER — METHYLPREDNISOLONE ACETATE 80 MG/ML IJ SUSP
120.0000 mg | Freq: Once | INTRAMUSCULAR | Status: AC
Start: 2021-01-15 — End: 2021-01-15
  Administered 2021-01-15: 120 mg via INTRAMUSCULAR

## 2021-01-15 MED ORDER — OLMESARTAN MEDOXOMIL 20 MG PO TABS: 20.0000 mg | ORAL_TABLET | Freq: Every day | ORAL | 11 refills | Status: DC

## 2021-01-15 NOTE — Patient Instructions (Addendum)
You need appt with Dr Elsworth Soho in [place]  next available   PFTs need to be done in 6 weeks at [place] = Automatic = Always=    Breztri Take 2 puffs first thing in am and then another 2 puffs about 12 hours later.    Work on inhaler technique:  relax and gently blow all the way out then take a nice smooth full deep breath back in, triggering the inhaler at same time you start breathing in.  Hold for up to 5 seconds if you can. Blow out thru nose. Rinse and gargle with water when done.  If mouth or throat bother you at all,  try brushing teeth/gums/tongue with arm and hammer toothpaste/ make a slurry and gargle and spit out.      Plan B = Backup (to supplement plan A, not to replace it) Only use your albuterol inhaler as a rescue medication to be used if you can't catch your breath by resting or doing a relaxed purse lip breathing pattern.  - The less you use it, the better it will work when you need it. - Ok to use the inhaler up to 2 puffs  every 4 hours if you must but call for appointment if use goes up over your usual need - Don't leave home without it !!  (think of it like the spare tire for your car)   Plan C = Crisis (instead of Plan B but only if Plan B stops working) - only use your albuterol nebulizer if you first try Plan B and it fails to help > ok to use the nebulizer up to every 4 hours but if start needing it regularly call for immediate appointment   Stop lisinopril and start benicar (olmesartan) 20 mg each in its place   Depomedrol 120 mg IM today

## 2021-01-15 NOTE — Progress Notes (Signed)
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  He is here for shortness of breath and cough.  History is somewhat poor and is gathered by talking to him and review of the past medical history in chart.  As best as I can gather he is to have asthma as a child but then he says he outgrew it.  For the last 5 years prior to OV    he is on Advair and Singulair for shortness of breath although he says specifically it is not meant to be for asthma or COPD.  However he also tells me that he is only been symptomatic for the last year or so.  He had insidious onset of shortness of breath for the last 1 year progressively worse.  Insidious onset of cough for the last 6 months and progressively worse.  Cough/breathing  worse when lie down   he is coughing all the time it is a dry cough in quality min  Green  Mucus.  He gets short of breath changing clothes and doing minimal activities class III exertion.  Relieved by rest no associated chest pain.  Chart review shows that he has had cardiac workup.  Echocardiogram October 2018 showed grade 2 diastolic dysfunction.  He then underwent cardiac catheterization October 2018 that was nonobstructive coronary artery disease.  Walking desaturation test on 01/26/2017 185 feet x 3 laps on ROOM AIR:  did not desaturate. Rest pulse ox was 99%, final pulse ox was 97%. HR response 67/min at rest to 81/min at peak exertion. Patient Kenneth Fuller  no Desaturate < 88% . Kenneth Fuller did not  Desaturated </= 3% points. Kenneth Fuller did not get tachyardic.  He did get pulmonary function test on December 23, 2016 that shows restriction with reduced diffusion capacity.  Total lung capacity 72% FVC, 1.6 L / 40% and DLCO of 19.4/68%.  Feno 01/26/2017 ->  8ppb       Of note in  October 2017 he was diagnosed to have very severe sleep apnea with apnea hypotony index of greater than 100/h and with desaturations.[name]  He is being managed by Kenneth Fuller.   He supposed to be using CPAP but he is noncompliant with it due to smothering when on it  rec No change in medications W/u for ILD > neg (see CT below)    ONO 01/28/17 -> on RA </ = 88% is 5h 39 minutes. STart 2LNC at night    02/10/2017 acute extended ov/Kenneth Fuller re: refractory cough >> sob  Chief Complaint  Patient presents with   Acute Visit    Breathing has gradually been worse since his last visit. He is coughing more. Hard to produce sputum- yellow to green when he does. He has been waking up in the night coughing. He has been having to sleep in his recliner. He is using his albuterol inhaler 3 x per day on average.   cough is 24/7 and very minimally productive, severe fits to point of feeling choked and cannot lie back at all and breath comfortably more due to cough than sob so has been sleeping in recliner @ 60 degrees  Insists onset was x 5 y and steadily downhill, no better on Advair hfa  rec Omeprazole 40 mg Take 30- 60 min before your first and last meals of the day  GERD diet   Stop advair for now and we'll consider changing you to low dose symbicort or dulera when you return in 2 weeks  Only use your albuterol  Prednisone 10 mg take  4 each am x 2 days,   2 each am x 2 days,  1 each am x 2 days and stop  Take delsym two tsp every 12 hours and supplement if needed with  tramadol 50 mg up to 1-2 every 4 hours.   Ok to re-try the cpap once you get the cough under control for  A night or two      02/25/2017 acute extended ov/Kenneth Fuller re: uacs Chief Complaint  Patient presents with   Follow-up    Breathing is slightly worse. He is coughing slightly less.  He states he is retaining more fluid. He is using his albuterol inhaler once daily on average.   sleeping better not aware of cough keeping him up p tramadol but  still sleepiing 60 degrees recliner  s noct need for saba Has choking spells >  No  better on alb and no increase in alb since d/c'd advair Min improved with pred > mild leg swelling  Constant sense of pnds daytime  Rec Start Gabapentin 100 mg three times a day  For drainage / throat tickle try take CHLORPHENIRAMINE  4 mg - take one every 4 hours as needed  Take delsym two tsp every 12 hours and supplement if needed with  tramadol 50 mg up to 2 every 4 hours to suppress the urge to cough.  Please schedule a follow up office visit in 2  weeks, sooner if needed  with all medications > did not return        01/15/2021  Re establish ov/Kenneth Fuller re: sob/ choking episodes    maint on breztri but poor hfa   Chief Complaint  Patient presents with   Follow-up    Breathing is doing better since the last visit. He has some coughing and wheezing. He feels that the daliresp helps- PCP started him on. He rarely uses his albuterol inhaler but uses albuterol neb 3 x per day on average.   Dyspnea:  extremely sedentary / uses [place] parking , never shopping  or MB walking now   Cough: still having choking episodes at rest not related to eating  Sleeping: bed is flat/ one pillow, L side on Bipap ? Who prescribed it  + 2lpm  SABA use: neb helps the most  02: variably using during the day  Covid status:  twice infected never vax    No obvious day to day or daytime variability or assoc excess/ purulent sputum or mucus plugs or hemoptysis or cp or chest tightness, subjective wheeze or overt sinus or hb symptoms.   Sleeping  without nocturnal  or early am exacerbation  of respiratory  c/o's or need for noct saba. Also denies any obvious fluctuation of symptoms with weather or environmental changes or other aggravating or alleviating factors except as outlined above   No unusual exposure hx or h/o childhood pna/ asthma or knowledge of premature birth.  Current Allergies, Complete Past Medical History, Past Surgical  History, Family History, and Social History were reviewed in [facility] record.  ROS  The following are not active complaints unless bolded Hoarseness, sore throat, dysphagia, dental problems, itching, sneezing,  nasal congestion or  discharge of excess mucus or purulent secretions, ear ache,   fever, chills, sweats, unintended wt loss or wt gain, classically pleuritic or exertional cp,  orthopnea pnd or arm/hand swelling  or leg swelling, presyncope, palpitations, abdominal pain, anorexia, nausea, vomiting, diarrhea  or change in bowel habits or change in bladder habits, change in stools or change in urine, dysuria, hematuria,  rash, arthralgias, visual complaints, headache, numbness, weakness or ataxia or problems with walking or coordination,  change in mood or  memory.        Current Meds - - NOTE:   Unable to verify as accurately reflecting what pt takes  / confused with instructions by multiple providers   Medication Sig   albuterol (PROVENTIL) (2.5 MG/3ML) 0.083% nebulizer solution Take 3 mLs (2.5 mg total) by nebulization every 6 (six) hours as needed for wheezing or shortness of breath.   albuterol (VENTOLIN HFA) 108 (90 Base) MCG/ACT inhaler Inhale 2 puffs into the lungs every 6 (six) hours as needed for wheezing or shortness of breath.   amitriptyline (ELAVIL) 25 MG tablet Take 25 mg by mouth at bedtime.   atorvastatin (LIPITOR) 40 MG tablet Take 40 mg by mouth daily. 1 Tablet Daily   benzonatate (TESSALON) 200 MG capsule Take 1 capsule (200 mg total) by mouth 2 (two) times daily as needed for cough.   bisoprolol (ZEBETA) 5 MG tablet Take 1/2 (one-half) tablet by mouth once daily   Budeson-Glycopyrrol-Formoterol (BREZTRI AEROSPHERE) 160-9-4.8 MCG/ACT AERO Inhale 2 puffs into the lungs in the morning and at bedtime.   cetirizine (ZYRTEC) 10 MG tablet Take 1 tablet (10 mg total) by mouth daily.   chlorthalidone (HYGROTON) 25 MG tablet Take by mouth.   Cholecalciferol  (VITAMIN D3) 125 MCG (5000 UT) TABS Take 5,000 Units by mouth daily.   clonazePAM (KLONOPIN) 0.5 MG tablet Take by mouth.   cloNIDine (CATAPRES) 0.2 MG tablet TAKE 1 TABLET BY MOUTH THREE TIMES DAILY   colchicine 0.6 MG tablet Take 1 tablet by mouth once daily   cycloSPORINE (RESTASIS) 0.05 % ophthalmic emulsion Apply to eye.   dapagliflozin propanediol (FARXIGA) 10 MG TABS tablet Take 1 tablet (10 mg total) by mouth daily.   famotidine (PEPCID) 20 MG tablet Take 1 tablet (20 mg total) by mouth at bedtime.   FEROSUL 325 (65 Fe) MG tablet Take 325 mg by mouth 3 (three) times daily.   fluticasone (FLONASE) 50 MCG/ACT nasal spray Place 2 sprays into both nostrils daily.   furosemide (LASIX) 40 MG tablet Take 40 mg by mouth daily as needed (fluid retention.).   gabapentin (NEURONTIN) 300 MG capsule 1 cap twice daily and 2 caps at bedtime   glimepiride (AMARYL) 4 MG tablet Take by mouth.   hydrALAZINE (APRESOLINE) 25 MG tablet TAKE 3 TABLETS BY MOUTH THREE TIMES DAILY   hydrALAZINE (APRESOLINE) 50 MG tablet TAKE 1 TABLET BY MOUTH THREE TIMES DAILY   isosorbide dinitrate (ISORDIL) 20 MG tablet Take 1 tablet (20 mg total) by mouth 3 (three) times daily.   lisinopril (ZESTRIL) 20 MG tablet Take 1 tablet by mouth daily.   nitroGLYCERIN (NITROSTAT) 0.4 MG SL tablet Place under the tongue.   omeprazole (PRILOSEC) 40 MG capsule TAKE 1 CAPSULE BY MOUTH ONCE DAILY 30 TO 60 MINUTES PRIOR TO BREAKFAST (Patient taking differently: Take 40 mg by mouth daily.)   ondansetron (ZOFRAN) 4 MG tablet Take by mouth.   ONETOUCH ULTRA test strip USE 1 STRIP TO CHECK GLUCOSE ONCE DAILY   polyethylene  glycol (MIRALAX / GLYCOLAX) 17 g packet Take 17 g by mouth as needed for mild constipation.   RESTASIS 0.05 % ophthalmic emulsion Place 1 drop into both eyes in the morning and at bedtime.   roflumilast (DALIRESP) 500 MCG TABS tablet Take 1 tablet (500 mcg total) by mouth daily.   sertraline (ZOLOFT) 100 MG tablet Take 1  tablet (100 mg total) by mouth daily.   sertraline (ZOLOFT) 100 MG tablet Take 1 tablet by mouth daily.   spironolactone (ALDACTONE) 25 MG tablet Take by mouth.   tamsulosin (FLOMAX) 0.4 MG CAPS capsule Take 1 capsule (0.4 mg total) by mouth daily after supper.           Objective:   Physical Exam      wts  01/15/2021       267   02/25/2017      281   02/10/17 278 lb (126.1 kg)  01/26/17 281 lb 3.2 oz (127.6 kg)  01/14/17 286 lb 9.6 oz (130 kg)     Vital signs reviewed  01/15/2021  - Note at rest 02 sats  96% on RA   General appearance:    obese  wm nad at rest   HEENT : pt wearing mask not removed for exam due to covid -19 concerns.    NECK :  without JVD/Nodes/TM/ nl carotid upstrokes bilaterally   LUNGS: no acc muscle use,  Nl contour chest which is clear to A and P bilaterally without cough on insp or exp maneuvers   CV:  RRR  no s3 or murmur or increase in P2, and no edema   ABD:  soft and nontender with nl inspiratory excursion in the supine position. No bruits or organomegaly appreciated, bowel sounds nl  MS:  Nl gait/ ext warm without deformities, calf tenderness, cyanosis or clubbing No obvious joint restrictions   SKIN: warm and dry without lesions    NEURO:  alert, approp, nl sensorium with  no motor or cerebellar deficits apparent.         I personally reviewed images and agree with radiology impression as follows:   Chest CTrenal 01/05/21  Lower chest: No acute abnormality.Tiny nodule in the periphery of the left lower lobe measures 3 mm, image 20/4. Unchanged compatible with a benign nodule.   I personally reviewed images and agree with radiology impression as follows:  CXR:   11/20/20 Lung volumes are normal. No consolidative airspace disease. No pleural effusions. No pneumothorax. No pulmonary nodule or mass noted. Pulmonary vasculature and the cardiomediastinal silhouette are within normal limits     Assessment & Plan:

## 2021-01-16 ENCOUNTER — Encounter: Payer: Self-pay | Admitting: Internal Medicine

## 2021-01-16 NOTE — Assessment & Plan Note (Addendum)
06/07/20 Echo 1. Left ventricular ejection fraction, by estimation, is 55 to 60%. The  left ventricle has normal function. The left ventricle has no regional  wall motion abnormalities. There is mild left ventricular hypertrophy.  Left ventricular diastolic parameters  are consistent with Grade I diastolic dysfunction (impaired relaxation).  2. Right ventricular systolic function is normal. The right ventricular  size is normal.  3. Left atrial size was moderately dilated.  4. The mitral valve is grossly normal. Trivial mitral valve  regurgitation.  5. The aortic valve is tricuspid. Aortic valve regurgitation is not  visualized.  6. The inferior vena cava is normal in size with greater than 50%  respiratory variability, suggesting right atrial pressure of 3 mmHg  Trial off acei 01/16/2021 >>>   In the best review of chronic cough to date ( NEJM 2016 375 7802-0891) ,  ACEi are now felt to cause cough in up to  20% of pts which is a 4 fold increase from previous reports and does not include the variety of non-specific complaints we see in pulmonary clinic in pts on ACEi but previously attributed to another dx like  Copd/asthma and  include PNDS, throat and chest congestion, "bronchitis", unexplained dyspnea and noct "strangling" sensations, and hoarseness, but also  atypical /refractory GERD symptoms like dysphagia and "bad heartburn"   The only way I know  to prove this is not an "ACEi Case" is a trial off ACEi x a minimum of 6 weeks then regroup  Try benicar 20 mg daily until returns for pfts in 6 weeks and he will need TSH/ alpha one testing at that time    Each maintenance medication was reviewed in detail including emphasizing most importantly the difference between maintenance and prns and under what circumstances the prns are to be triggered using an action plan format where appropriate.  Total time for H and P, chart review, counseling, reviewing hfa/neb/02 device(s) , directly  observing portions of ambulatory 02 saturation study/ and generating customized AVS unique to this office visit / same day charting = 47 min

## 2021-01-16 NOTE — Assessment & Plan Note (Signed)
Referred back to DR Elsworth Soho in the [facility] office 01/16/2021 >>>

## 2021-01-16 NOTE — Assessment & Plan Note (Signed)
Body mass index is 41.82 kg/m.  -  trending  Down now, encouraged  Lab Results  Component Value Date   TSH 3.30 02/25/2017      Contributing to doe and risk of worsening GERD >>>   reviewed the need and the process to achieve and maintain neg calorie balance > defer f/u primary care including intermittently monitoring thyroid status

## 2021-01-16 NOTE — Assessment & Plan Note (Signed)
Quit smoking 1971  Spirometry  12/23/16   FEV1 1.61 (56%)  Ratio 79 p 22% improvement from saba    - Advair dpi d/c'd 02/10/2017 due to uacs component >> asthma  - Spirometry 02/25/2017  FEV1 1.29 (45%)  Ratio 68 s prior rx with mild curvature off advair x 2 weeks with 8% response to saba  - 01/15/2021  After extensive coaching inhaler device,  effectiveness =    75% from a baseline of 25% > continue breztri and d/c acei  - 01/15/2021   Walked on RA x  1  lap(s) =  approx 271f @ avg  pace, stopped due to light headed with lowest 02 sats 93%    When respiratory symptoms begin or become refractory well after a patient reports complete smoking cessation,  Especially when this wasn't the case while they were smoking, a red flag is raised based on the work of Dr FKris Moutonwhich states:  if you quit smoking when your best day FEV1 is still well preserved it is highly unlikely you will progress to severe disease.  That is to say, once the smoking stops,  the symptoms should not suddenly erupt or markedly worsen.  If so, the differential diagnosis should include  obesity/deconditioning,  LPR/Reflux/Aspiration syndromes,  occult CHF, or  especially side effects of medications commonly used in this population, especially ACEi   rec optimze hfa / minimize saba dependency  Will need f/u pfts and probably benefit from pulmonary rehab once we get him optimized from a medication/ inhaler standpoint.

## 2021-01-23 DIAGNOSIS — N3942 Incontinence without sensory awareness: Secondary | ICD-10-CM | POA: Diagnosis not present

## 2021-01-23 DIAGNOSIS — R350 Frequency of micturition: Secondary | ICD-10-CM | POA: Diagnosis not present

## 2021-01-26 ENCOUNTER — Ambulatory Visit (INDEPENDENT_AMBULATORY_CARE_PROVIDER_SITE_OTHER): Payer: PPO | Admitting: Family Medicine

## 2021-01-26 ENCOUNTER — Encounter: Payer: Self-pay | Admitting: Family Medicine

## 2021-01-26 VITALS — BP 136/75 | HR 77 | Ht 67.0 in | Wt 262.0 lb

## 2021-01-26 DIAGNOSIS — I2510 Atherosclerotic heart disease of native coronary artery without angina pectoris: Secondary | ICD-10-CM

## 2021-01-26 DIAGNOSIS — I5042 Chronic combined systolic (congestive) and diastolic (congestive) heart failure: Secondary | ICD-10-CM

## 2021-01-26 DIAGNOSIS — Z23 Encounter for immunization: Secondary | ICD-10-CM | POA: Diagnosis not present

## 2021-01-26 DIAGNOSIS — J449 Chronic obstructive pulmonary disease, unspecified: Secondary | ICD-10-CM

## 2021-01-26 DIAGNOSIS — N1832 Chronic kidney disease, stage 3b: Secondary | ICD-10-CM

## 2021-01-26 DIAGNOSIS — E1169 Type 2 diabetes mellitus with other specified complication: Secondary | ICD-10-CM

## 2021-01-26 DIAGNOSIS — E782 Mixed hyperlipidemia: Secondary | ICD-10-CM | POA: Diagnosis not present

## 2021-01-26 DIAGNOSIS — J441 Chronic obstructive pulmonary disease with (acute) exacerbation: Secondary | ICD-10-CM | POA: Diagnosis not present

## 2021-01-26 DIAGNOSIS — E1142 Type 2 diabetes mellitus with diabetic polyneuropathy: Secondary | ICD-10-CM | POA: Diagnosis not present

## 2021-01-26 DIAGNOSIS — I5032 Chronic diastolic (congestive) heart failure: Secondary | ICD-10-CM

## 2021-01-26 DIAGNOSIS — E785 Hyperlipidemia, unspecified: Secondary | ICD-10-CM | POA: Diagnosis not present

## 2021-01-26 DIAGNOSIS — I10 Essential (primary) hypertension: Secondary | ICD-10-CM

## 2021-01-26 DIAGNOSIS — E119 Type 2 diabetes mellitus without complications: Secondary | ICD-10-CM

## 2021-01-26 LAB — BAYER DCA HB A1C WAIVED: HB A1C (BAYER DCA - WAIVED): 7.4 % — ABNORMAL HIGH (ref 4.8–5.6)

## 2021-01-26 NOTE — Progress Notes (Signed)
BP 136/75    Pulse 77    Ht 5' 7" (1.702 m)    Wt 262 lb (118.8 kg)    SpO2 95%    BMI 41.04 kg/m    Subjective:   Patient ID: Kenneth Fuller, male    DOB: 12-11-1945, 75 y.o.   MRN: [id number]  HPI: Kenneth Fuller is a 75 y.o. male presenting on 01/26/2021 for Medical Management of Chronic Issues and COPD   HPI COPD Patient is coming in for COPD recheck today.  He is currently on albuterol and Breztri and Daliresp.  He has a mild chronic cough but denies any major coughing spells or wheezing spells.  He has 7nighttime symptoms per week and 7daytime symptoms per week currently.  He does feel little bit better but still has coughing quite a bit of the time but he is a little more stable and has uses oxygen mainly at night and not as much during the day now.  He does still see pulmonology and cardiology.  They have both evaluated recently to see what is causing his breathing issues and cannot find an exact source.  Type 2 diabetes mellitus Patient comes in today for recheck of his diabetes. Patient has been currently taking [place] and glimepiride, A1c 7.4 but likely elevated because of all the prednisone did not recently. Patient is currently on an ACE inhibitor/ARB. Patient has seen an ophthalmologist this year. Patient denies any issues with their feet. The symptom started onset as an adult hyperlipidemia and hypertension and CHF and CAD ARE RELATED TO DM   Hyperlipidemia Patient is coming in for recheck of his hyperlipidemia. The patient is currently taking atorvastatin. They deny any issues with myalgias or history of liver damage from it. They deny any focal numbness or weakness or chest pain.   Hypertension and CHF and CAD Patient is currently on bisoprolol and clonidine and hydralazine and isosorbide dinitrate and olmesartan and spironolactone, and their blood pressure today is 136/75. Patient denies any lightheadedness or dizziness. Patient denies headaches, blurred vision,  chest pains, or weakness. Denies any side effects from medication and is content with current medication.   Relevant past medical, surgical, family and social history reviewed and updated as indicated. Interim medical history since our last visit reviewed. Allergies and medications reviewed and updated.  Review of Systems  Constitutional:  Negative for chills and fever.  HENT:  Positive for congestion.   Eyes:  Negative for visual disturbance.  Respiratory:  Positive for cough, shortness of breath and wheezing.   Cardiovascular:  Negative for chest pain and leg swelling.  Musculoskeletal:  Negative for back pain and gait problem.  Skin:  Negative for rash.  Neurological:  Negative for dizziness, weakness and light-headedness.  All other systems reviewed and are negative.  Per HPI unless specifically indicated above   Allergies as of 01/26/2021       Reactions   Amlodipine Besy-benazepril Hcl Swelling, Other (See Comments)   Makes tongue swell (lotrel)   Hydrocodone Itching   Can tolerate in low doses   Oxycodone Itching   Phenergan [promethazine Hcl] Other (See Comments)   "I can't remember."        Medication List        Accurate as of January 26, 2021  3:56 PM. If you have any questions, ask your nurse or doctor.          albuterol 108 (90 Base) MCG/ACT inhaler Commonly known as: VENTOLIN HFA Inhale 2  puffs into the lungs every 6 (six) hours as needed for wheezing or shortness of breath.   albuterol (2.5 MG/3ML) 0.083% nebulizer solution Commonly known as: PROVENTIL Take 3 mLs (2.5 mg total) by nebulization every 6 (six) hours as needed for wheezing or shortness of breath.   amitriptyline 25 MG tablet Commonly known as: ELAVIL Take 25 mg by mouth at bedtime.   atorvastatin 40 MG tablet Commonly known as: LIPITOR Take 40 mg by mouth daily. 1 Tablet Daily   benzonatate 200 MG capsule Commonly known as: TESSALON Take 1 capsule (200 mg total) by mouth 2  (two) times daily as needed for cough.   bisoprolol 5 MG tablet Commonly known as: ZEBETA Take 1/2 (one-half) tablet by mouth once daily   Breztri Aerosphere 160-9-4.8 MCG/ACT Aero Generic drug: Budeson-Glycopyrrol-Formoterol Inhale 2 puffs into the lungs in the morning and at bedtime.   cetirizine 10 MG tablet Commonly known as: ZYRTEC Take 1 tablet (10 mg total) by mouth daily.   chlorthalidone 25 MG tablet Commonly known as: HYGROTON Take by mouth.   clonazePAM 0.5 MG tablet Commonly known as: KLONOPIN Take by mouth.   cloNIDine 0.2 MG tablet Commonly known as: CATAPRES TAKE 1 TABLET BY MOUTH THREE TIMES DAILY   colchicine 0.6 MG tablet Take 1 tablet by mouth once daily   dapagliflozin propanediol 10 MG Tabs tablet Commonly known as: Farxiga Take 1 tablet (10 mg total) by mouth daily.   famotidine 20 MG tablet Commonly known as: PEPCID Take 1 tablet (20 mg total) by mouth at bedtime.   FeroSul 325 (65 FE) MG tablet Generic drug: ferrous sulfate Take 325 mg by mouth 3 (three) times daily.   fluticasone 50 MCG/ACT nasal spray Commonly known as: FLONASE Place 2 sprays into both nostrils daily.   furosemide 40 MG tablet Commonly known as: LASIX Take 40 mg by mouth daily as needed (fluid retention.).   gabapentin 300 MG capsule Commonly known as: NEURONTIN 1 cap twice daily and 2 caps at bedtime   glimepiride 4 MG tablet Commonly known as: AMARYL Take by mouth.   hydrALAZINE 25 MG tablet Commonly known as: APRESOLINE TAKE 3 TABLETS BY MOUTH THREE TIMES DAILY   hydrALAZINE 50 MG tablet Commonly known as: APRESOLINE TAKE 1 TABLET BY MOUTH THREE TIMES DAILY   isosorbide dinitrate 20 MG tablet Commonly known as: ISORDIL Take 1 tablet (20 mg total) by mouth 3 (three) times daily.   nitroGLYCERIN 0.4 MG SL tablet Commonly known as: NITROSTAT Place 1 tablet (0.4 mg total) under the tongue every 5 (five) minutes x 3 doses as needed for chest pain.    nitroGLYCERIN 0.4 MG SL tablet Commonly known as: NITROSTAT Place under the tongue.   olmesartan 20 MG tablet Commonly known as: Benicar Take 1 tablet (20 mg total) by mouth daily.   omeprazole 40 MG capsule Commonly known as: PRILOSEC TAKE 1 CAPSULE BY MOUTH ONCE DAILY 30 TO 60 MINUTES PRIOR TO BREAKFAST What changed: See the new instructions.   ondansetron 4 MG tablet Commonly known as: ZOFRAN Take by mouth.   OneTouch Ultra test strip Generic drug: glucose blood USE 1 STRIP TO CHECK GLUCOSE ONCE DAILY   polyethylene glycol 17 g packet Commonly known as: MIRALAX / GLYCOLAX Take 17 g by mouth as needed for mild constipation.   Restasis 0.05 % ophthalmic emulsion Generic drug: cycloSPORINE Place 1 drop into both eyes in the morning and at bedtime.   cycloSPORINE 0.05 % ophthalmic emulsion Commonly known  as: RESTASIS Apply to eye.   roflumilast 500 MCG Tabs tablet Commonly known as: DALIRESP Take 1 tablet (500 mcg total) by mouth daily.   sertraline 100 MG tablet Commonly known as: ZOLOFT Take 1 tablet by mouth daily.   sertraline 100 MG tablet Commonly known as: ZOLOFT Take 1 tablet (100 mg total) by mouth daily.   spironolactone 25 MG tablet Commonly known as: ALDACTONE Take by mouth.   tamsulosin 0.4 MG Caps capsule Commonly known as: FLOMAX Take 1 capsule (0.4 mg total) by mouth daily after supper.   Vitamin D3 125 MCG (5000 UT) Tabs Take 5,000 Units by mouth daily.         Objective:   BP 136/75    Pulse 77    Ht _0  (1.702 m)    Wt 262 lb (118.8 kg)    SpO2 95%    BMI 41.04 kg/m   Wt Readings from Last 3 Encounters:  01/26/21 262 lb (118.8 kg)  01/15/21 267 lb (121.1 kg)  01/05/21 270 lb (122.5 kg)    Physical Exam Vitals and nursing note reviewed.  Constitutional:      General: He is not in acute distress.    Appearance: He is well-developed. He is not diaphoretic.  Eyes:     General: No scleral icterus.    Conjunctiva/sclera:  Conjunctivae normal.  Neck:     Thyroid: No thyromegaly.  Cardiovascular:     Rate and Rhythm: Normal rate and regular rhythm.     Heart sounds: Normal heart sounds. No murmur heard. Pulmonary:     Effort: Pulmonary effort is normal. No respiratory distress.     Breath sounds: Normal breath sounds. No stridor. No wheezing, rhonchi or rales.  Musculoskeletal:        General: No swelling. Normal range of motion.     Cervical back: Neck supple.  Lymphadenopathy:     Cervical: No cervical adenopathy.  Skin:    General: Skin is warm and dry.     Findings: No rash.  Neurological:     Mental Status: He is alert and oriented to person, place, and time.     Coordination: Coordination normal.  Psychiatric:        Behavior: Behavior normal.      Assessment & Plan:   Problem List Items Addressed This Visit       Cardiovascular and Mediastinum   Coronary artery disease involving native coronary artery of native heart without angina pectoris   Essential hypertension   Relevant Orders   CBC with Differential/Platelet   Lipid panel   CMP14+EGFR   Bayer DCA Hb A1c Waived   CHF (congestive heart failure) (HCC)   RESOLVED: Chronic diastolic heart failure (HCC)     Respiratory   COPD GOLD 3      Endocrine   Diabetes mellitus type II, non insulin dependent (HCC)   Hyperlipidemia associated with type 2 diabetes mellitus ([place])     Genitourinary   CKD (chronic kidney disease), stage III ([facility])   Relevant Orders   CBC with Differential/Platelet   Lipid panel   CMP14+EGFR   Bayer DCA Hb A1c Waived     Other   Hyperlipidemia, unspecified   Other Visit Diagnoses     Chronic obstructive pulmonary disease with acute exacerbation (HCC)    -  Primary   Type 2 diabetes mellitus with diabetic polyneuropathy, without long-term current use of insulin (HCC)       Relevant Orders   CBC  with Differential/Platelet   Lipid panel   CMP14+EGFR   Bayer DCA Hb A1c Waived   Need for  immunization against influenza       Relevant Orders   Flu Vaccine QUAD High Dose(Fluad) (Completed)       Continue current medicine, A1c 7.4 but likely elevated because of all the steroids had recently.  Continue to follow with cardiology and pulmonology.  We will check other blood work today Follow up plan: Return in about 3 months (around 04/26/2021), or if symptoms worsen or fail to improve, for Diabetes and COPD and CHF.  Counseling provided for all of the vaccine components Orders Placed This Encounter  Procedures   Flu Vaccine QUAD High Dose(Fluad)   CBC with Differential/Platelet   Lipid panel   CMP14+EGFR   Bayer DCA Hb A1c Waived    Caryl Pina, MD [facility] 01/26/2021, 3:56 PM

## 2021-01-27 LAB — CBC WITH DIFFERENTIAL/PLATELET
Basophils Absolute: 0.1 10*3/uL (ref 0.0–0.2)
Basos: 1 %
EOS (ABSOLUTE): 0.2 10*3/uL (ref 0.0–0.4)
Eos: 2 %
Hematocrit: 43.7 % (ref 37.5–51.0)
Hemoglobin: 13.9 g/dL (ref 13.0–17.7)
Immature Grans (Abs): 0 10*3/uL (ref 0.0–0.1)
Immature Granulocytes: 0 %
Lymphocytes Absolute: 1.8 10*3/uL (ref 0.7–3.1)
Lymphs: 20 %
MCH: 26.2 pg — ABNORMAL LOW (ref 26.6–33.0)
MCHC: 31.8 g/dL (ref 31.5–35.7)
MCV: 82 fL (ref 79–97)
Monocytes Absolute: 0.7 10*3/uL (ref 0.1–0.9)
Monocytes: 7 %
Neutrophils Absolute: 6.5 10*3/uL (ref 1.4–7.0)
Neutrophils: 70 %
Platelets: 242 10*3/uL (ref 150–450)
RBC: 5.31 x10E6/uL (ref 4.14–5.80)
RDW: 15.1 % (ref 11.6–15.4)
WBC: 9.3 10*3/uL (ref 3.4–10.8)

## 2021-01-27 LAB — CMP14+EGFR
ALT: 18 IU/L (ref 0–44)
AST: 17 IU/L (ref 0–40)
Albumin/Globulin Ratio: 1.7 (ref 1.2–2.2)
Albumin: 4.2 g/dL (ref 3.7–4.7)
Alkaline Phosphatase: 105 IU/L (ref 44–121)
BUN/Creatinine Ratio: 16 (ref 10–24)
BUN: 16 mg/dL (ref 8–27)
Bilirubin Total: 0.4 mg/dL (ref 0.0–1.2)
CO2: 25 mmol/L (ref 20–29)
Calcium: 9 mg/dL (ref 8.6–10.2)
Chloride: 98 mmol/L (ref 96–106)
Creatinine, Ser: 1.02 mg/dL (ref 0.76–1.27)
Globulin, Total: 2.5 g/dL (ref 1.5–4.5)
Glucose: 186 mg/dL — ABNORMAL HIGH (ref 70–99)
Potassium: 4.2 mmol/L (ref 3.5–5.2)
Sodium: 138 mmol/L (ref 134–144)
Total Protein: 6.7 g/dL (ref 6.0–8.5)
eGFR: 77 mL/min/{1.73_m2} (ref >59)

## 2021-01-27 LAB — LIPID PANEL
Chol/HDL Ratio: 1.8 ratio (ref 0.0–5.0)
Cholesterol, Total: 88 mg/dL — ABNORMAL LOW (ref 100–199)
HDL: 49 mg/dL (ref >39)
LDL Chol Calc (NIH): 18 mg/dL (ref 0–99)
Triglycerides: 118 mg/dL (ref 0–149)
VLDL Cholesterol Cal: 21 mg/dL (ref 5–40)

## 2021-01-28 NOTE — Patient Instructions (Addendum)
Visit Information  Thank you for taking time to visit with me today. Please don't hesitate to contact me if I can be of assistance to you before our next scheduled telephone appointment.  Following are the goals we discussed today:    Diabetes: Uncontrolled-A1C 7.9%; current treatment: OZEMPIC 0.25MG SQ WEEKLY, FARXIGA 10MG DAILY;  CONTINUE Ozempic 0.96m sq weekly Denies personal and family history of Medullary thyroid cancer (MTC) Enrolled in [employer] patient assistance Increase Farxiga to 177mDAILY--patient stable Escribed to [employer] Received notification from [place] [facility] UNTIL 02/07/21.  AUTO RE-ENROLLMENT FOR 2023 WILL BEGIN AFTER 02/07/21 & LAST UNTIL 02/07/22. Need to determine if patient is still taking glimepiride** recommended to discontinue, but still on med list Current glucose readings: fasting glucose <200, post prandial glucose: N/A Denies hypoglycemic/hyperglycemic symptoms Discussed meal planning options and Plate method for healthy eating Avoid sugary drinks and desserts Incorporate balanced protein, non starchy veggies, 1 serving of carbohydrate with each meal Increase water intake Increase physical activity as able Current exercise: UNABLE DUE TO CURRENT STATE--BLOOD PRESSURE LABILE; ENERGY LEVEL DECREASED Educated on medications-purpose & side effects Assessed patient finances. [facility]  [facility] will no longer be servicing patient after this year and with certain medications. PLAN[facility] has enough Ozempic 0.2534mo last until we can reapply in November 2022.  We will be ordering patient's Breztri via [employer] assistance program--call placed to az[employer]me to add breztri inhaler to patient's medications, [facility] for [facility]me patient  assistance)--RE-ENROLLED FOR 2023--MEDS TO SHIP TO PATIENT'S HOME.  Hypertension: Uncontrolled- BP goal <130/80; current treatment: bisoprolol, clonidine, hydralazine, isosorbide Current home readings:  traditional automatic cuff (reports labile from 100s-170s SBP) wrist cuff: 140/82, 123/71; PCP in office today 123/71 Reports hypotensive/hypertensive symptoms Counseled on taking all BP medications as prescribed and at the same time of day to keep steady control; BP is managed by cardiology, therefore patient instructed to call cards for any BP needs; patient to keep strict log of BP times and readings  Please call the care guide team at [phone] you need to cancel or reschedule your appointment.   The patient verbalized understanding of instructions, educational materials, and care plan provided today and declined offer to receive copy of patient instructions, educational materials, and care plan.   JulRegina EckharmD, BCPS Clinical Pharmacist, [facility] Phone [phone]

## 2021-01-28 NOTE — Progress Notes (Signed)
Chronic Care Management Pharmacy Note  01/14/2021 Name:  Kenneth Fuller MRN:  [id number] DOB:  02-16-1945  Summary: T2DM, HTN  Recommendations/Changes made from today's visit:   Diabetes: Uncontrolled-A1C 7.9%; current treatment: OZEMPIC 0.25MG SQ WEEKLY, FARXIGA 10MG DAILY;  CONTINUE Ozempic 0.34m sq weekly Denies personal and family history of Medullary thyroid cancer (MTC) Enrolled in novo nordisk patient assistance Increase Farxiga to 179mDAILY--patient stable Escribed to medvantx Received notification from AZ&ME regarding approval for F[place]ahann[place] assistance approved UNTIL 02/07/21.  AUTO RE-ENROLLMENT FOR 2023 WILL BEGIN AFTER 02/07/21 & LAST UNTIL 02/07/22. Need to determine if patient is still taking glimepiride** recommended to discontinue, but still on med list Current glucose readings: fasting glucose <200, post prandial glucose: N/A Denies hypoglycemic/hyperglycemic symptoms Discussed meal planning options and Plate method for healthy eating Avoid sugary drinks and desserts Incorporate balanced protein, non starchy veggies, 1 serving of carbohydrate with each meal Increase water intake Increase physical activity as able Current exercise: UNABLE DUE TO CURRENT STATE--BLOOD PRESSURE LABILE; ENERGY LEVEL DECREASED Educated on medications-purpose & side effects Assessed patient finances. [facility]  They will no longer be servicing patient after this year and with certain medications. PLAN: [facility] Patient has enough Ozempic 0.2577mo last until we can reapply in November 2022.  We will be ordering patient's Breztri via AstTime Warnertient assistance program--call placed to az&me to add breztri inhaler to patient's medications, [facility] ([facility]acy for az&me patient assistance)--RE-ENROLLED FOR 2023--MEDS TO SHIP TO PATIENT'S  HOME.  Hypertension: Uncontrolled- BP goal <130/80; current treatment: bisoprolol, clonidine, hydralazine, isosorbide Current home readings:  traditional automatic cuff (reports labile from 100s-170s SBP) wrist cuff: 140/82, 123/71; PCP in office today 123/71 Reports hypotensive/hypertensive symptoms Counseled on taking all BP medications as prescribed and at the same time of day to keep steady control; BP is managed by cardiology, therefore patient instructed to call cards for any BP needs; patient to keep strict log of BP times and readings   Patient Goals/Self-Care Activities Over the next 90 days, patient will:  - take medications as prescribed check glucose DAILY (FASTING) OR IF SYMPTOMATIC , document, and provide at future appointments check blood pressure DAILY OR IF SYMPTOMATIC, document, and provide at future appointments  Follow Up Plan: Telephone follow up appointment with care management team member scheduled for: 1 month  Subjective: Kenneth Fuller an [age]o. year old male who is a primary patient of Kenneth Fuller.  The CCM team was consulted for assistance with disease management and care coordination needs.    Engaged with patient by telephone for follow up visit in response to provider referral for pharmacy case management and/or care coordination services.   Consent to Services:  The patient was given information about Chronic Care Management services, agreed to services, and gave verbal consent prior to initiation of services.  Please see initial visit note for detailed documentation.   Patient Care Team: Kenneth Fuller as PCP - General (Family Medicine) Croitoru, MihDani GobbleD as PCP - Cardiology (Cardiology) Le,Harlen LabsD as Referring Physician (Optometry) ForShea EvansicNorva RiffleCSW as Social Worker (Licensed Clinical Social Worker) HudCori RazorriDelice BisonN as Case Manager Croitoru, MihDani GobbleD as Consulting Physician (Cardiology) PruLavera GuisePHHackensack Meridian Health Carrierharmacist) BhuLiana GeroldD as Consulting Physician (Nephrology) JacMilus BanisterD as Attending Physician (Gastroenterology) AlvElsworth Soho  Leanna Sato, MD as Consulting Physician (Pulmonary Disease)  Objective:  Lab Results  Component Value Date   CREATININE 1.02 01/26/2021   CREATININE 1.12 10/24/2020   CREATININE 1.13 10/17/2020    Lab Results  Component Value Date   HGBA1C 7.4 (H) 01/26/2021   Last diabetic Eye exam:  Lab Results  Component Value Date/Time   HMDIABEYEEXA No Retinopathy 03/23/2018 12:00 AM    Last diabetic Foot exam: No results found for: HMDIABFOOTEX      Component Value Date/Time   CHOL 88 (L) 01/26/2021 1516   CHOL 111 06/12/2012 1232   TRIG 118 01/26/2021 1516   TRIG 141 01/24/2014 1143   TRIG 192 (H) 06/12/2012 1232   HDL 49 01/26/2021 1516   HDL 38 (L) 01/24/2014 1143   HDL 33 (L) 06/12/2012 1232   CHOLHDL 1.8 01/26/2021 1516   CHOLHDL 4.8 11/22/2006 1712   VLDL 39 11/22/2006 1712   LDLCALC 18 01/26/2021 1516   LDLCALC 27 10/25/2013 1224   LDLCALC 40 06/12/2012 1232    Hepatic Function Latest Ref Rng & Units 01/26/2021 10/17/2020 07/14/2020  Total Protein 6.0 - 8.5 g/dL 6.7 6.6 6.8  Albumin 3.7 - 4.7 g/dL 4.2 3.8 4.6  AST 0 - 40 IU/L _0 ALT 0 - 44 IU/L 18 29 33  Alk Phosphatase 44 - 121 IU/L 105 71 98  Total Bilirubin 0.0 - 1.2 mg/dL 0.4 0.6 0.5  Bilirubin, Direct 0.00 - 0.40 mg/dL - - -    Lab Results  Component Value Date/Time   TSH 3.30 02/25/2017 10:28 AM   TSH 2.300 08/21/2015 01:52 PM    CBC Latest Ref Rng & Units 01/26/2021 10/17/2020 07/14/2020  WBC 3.4 - 10.8 x10E3/uL 9.3 7.5 16.7(H)  Hemoglobin 13.0 - 17.7 g/dL 13.9 14.0 16.3  Hematocrit 37.5 - 51.0 % 43.7 43.5 48.5  Platelets 150 - 450 x10E3/uL 242 211 250    No results found for: VD25OH  Clinical ASCVD: No  The ASCVD Risk score (Arnett DK, et al., 2019) failed to calculate for the following reasons:   The valid total cholesterol range is 130 to 320 mg/dL     Other: (CHADS2VASc if Afib, PHQ9 if depression, MMRC or CAT for COPD, ACT, DEXA)  Social History   Tobacco Use  Smoking Status Former   Packs/day: 1.00   Types: Cigarettes   Start date: 02/09/1964   Quit date: 02/08/1969   Years since quitting: 52.0  Smokeless Tobacco Former   Quit date: 1971   BP Readings from Last 3 Encounters:  01/26/21 136/75  01/15/21 132/66  01/05/21 119/62   Pulse Readings from Last 3 Encounters:  01/26/21 77  01/15/21 77  01/05/21 86   Wt Readings from Last 3 Encounters:  01/26/21 262 lb (118.8 kg)  01/15/21 267 lb (121.1 kg)  01/05/21 270 lb (122.5 kg)    Assessment: Review of patient past medical history, allergies, medications, health status, including review of consultants reports, laboratory and other test data, was performed as part of comprehensive evaluation and provision of chronic care management services.   SDOH:  (Social Determinants of Health) assessments and interventions performed:    CCM Care Plan  Allergies  Allergen Reactions   Amlodipine Besy-Benazepril Hcl Swelling and Other (See Comments)    Makes tongue swell (lotrel)   Hydrocodone Itching    Can tolerate in low doses   Oxycodone Itching   Phenergan [Promethazine Hcl] Other (See Comments)    "I can't remember."  Medications Reviewed Today     Reviewed by [name], [name], MD (Physician) on 01/26/21 at 1540  Med List Status: <None>   Medication Order Taking? Sig Documenting Provider Last Dose Status Informant  albuterol (PROVENTIL) (2.5 MG/3ML) 0.083% nebulizer solution [id number] Yes Take 3 mLs (2.5 mg total) by nebulization every 6 (six) hours as needed for wheezing or shortness of breath. [name], [name], MD Taking Active Multiple Informants  albuterol (VENTOLIN HFA) 108 (90 Base) MCG/ACT inhaler [id number] Yes Inhale 2 puffs into the lungs every 6 (six) hours as needed for wheezing or shortness of breath. Claretta Fraise, MD Taking Active Multiple  Informants  amitriptyline (ELAVIL) 25 MG tablet [id number] Yes Take 25 mg by mouth at bedtime. [provider] Taking Active Multiple Informants  atorvastatin (LIPITOR) 40 MG tablet [id number] Yes Take 40 mg by mouth daily. 1 Tablet Daily [provider] Taking Active Multiple Informants  benzonatate (TESSALON) 200 MG capsule [id number] Yes Take 1 capsule (200 mg total) by mouth 2 (two) times daily as needed for cough. Sharion Balloon, FNP Taking Active   bisoprolol (ZEBETA) 5 MG tablet [id number] Yes Take 1/2 (one-half) tablet by mouth once daily [name], [name], MD Taking Active   Budeson-Glycopyrrol-Formoterol (BREZTRI AEROSPHERE) 160-9-4.8 MCG/ACT Hollie Salk [id number] Yes Inhale 2 puffs into the lungs in the morning and at bedtime. [name], [name], MD Taking Active Multiple Informants           Med Note Parthenia Ames Nov 27, 2020  9:25 PM) VIA AZ&ME PATIENT ASSISTANCE  cetirizine (ZYRTEC) 10 MG tablet [id number] Yes Take 1 tablet (10 mg total) by mouth daily. Sharion Balloon, FNP Taking Active   chlorthalidone (HYGROTON) 25 MG tablet [id number] Yes Take by mouth. [provider] Taking Active Multiple Informants  Cholecalciferol (VITAMIN D3) 125 MCG (5000 UT) TABS [id number] Yes Take 5,000 Units by mouth daily. [provider] Taking Active Multiple Informants  clonazePAM (KLONOPIN) 0.5 MG tablet [id number] Yes Take by mouth. [provider] Taking Active   cloNIDine (CATAPRES) 0.2 MG tablet [id number] Yes TAKE 1 TABLET BY MOUTH THREE TIMES DAILY [name], [name], MD Taking Active   colchicine 0.6 MG tablet [id number] Yes Take 1 tablet by mouth once daily [name], [name], MD Taking Active Multiple Informants  cycloSPORINE (RESTASIS) 0.05 % ophthalmic emulsion [id number] Yes Apply to eye. [provider] Taking Active Multiple Informants  dapagliflozin propanediol (FARXIGA) 10 MG TABS tablet [id number] Yes Take 1 tablet (10 mg total) by  mouth daily. [name], [name], MD Taking Active            Med Note Parthenia Ames Nov 27, 2020  9:25 PM) VIA AZ&ME PATIENT ASSISTANCE   famotidine (PEPCID) 20 MG tablet [id number] Yes Take 1 tablet (20 mg total) by mouth at bedtime. Martyn Ehrich, NP Taking Active Multiple Informants  FEROSUL 325 (65 Fe) MG tablet [id number] Yes Take 325 mg by mouth 3 (three) times daily. [provider] Taking Active Multiple Informants  fluticasone (FLONASE) 50 MCG/ACT nasal spray [id number] Yes Place 2 sprays into both nostrils daily. Sharion Balloon, FNP Taking Active   furosemide (LASIX) 40 MG tablet [id number] Yes Take 40 mg by mouth daily as needed (fluid retention.). [provider] Taking Active Multiple Informants  gabapentin (NEURONTIN) 300 MG capsule [id number] Yes 1 cap twice daily and 2 caps at bedtime [provider] Taking Active   glimepiride (AMARYL) 4 MG tablet [id number] Yes  Take by mouth. [provider] Taking Active Multiple Informants  hydrALAZINE (APRESOLINE) 25 MG tablet [id number] Yes TAKE 3 TABLETS BY MOUTH THREE TIMES DAILY Croitoru, Mihai, MD Taking Active   hydrALAZINE (APRESOLINE) 50 MG tablet [id number] Yes TAKE 1 TABLET BY MOUTH THREE TIMES DAILY Croitoru, Mihai, MD Taking Active   isosorbide dinitrate (ISORDIL) 20 MG tablet [id number] Yes Take 1 tablet (20 mg total) by mouth 3 (three) times daily. [name], [name], MD Taking Active Multiple Informants  nitroGLYCERIN (NITROSTAT) 0.4 MG SL tablet [id number]  Place 1 tablet (0.4 mg total) under the tongue every 5 (five) minutes x 3 doses as needed for chest pain. Manuella Ghazi, [place], DO  Expired 07/07/20 2359            Med Note (DAVIS, SOPHIA A   Tue Jul 15, 2020  8:58 AM) On hand  nitroGLYCERIN (NITROSTAT) 0.4 MG SL tablet [id number] Yes Place under the tongue. [provider] Taking Active Multiple Informants  olmesartan (BENICAR) 20 MG tablet [id number] Yes Take 1 tablet (20 mg  total) by mouth daily. Tanda Rockers, MD Taking Active   omeprazole (PRILOSEC) 40 MG capsule [id number] Yes TAKE 1 CAPSULE BY MOUTH ONCE DAILY 30 TO 60 MINUTES PRIOR TO BREAKFAST  Patient taking differently: Take 40 mg by mouth daily.   Milus Banister, MD Taking Active   ondansetron [facility]) 4 MG tablet [id number] Yes Take by mouth. [provider] Taking Active Multiple Informants  ONETOUCH ULTRA test strip [id number] Yes USE 1 STRIP TO CHECK GLUCOSE ONCE DAILY [name], [name], MD Taking Active Multiple Informants  polyethylene glycol (MIRALAX / GLYCOLAX) 17 g packet [id number] Yes Take 17 g by mouth as needed for mild constipation. [provider] Taking Active Multiple Informants  RESTASIS 0.05 % ophthalmic emulsion [id number] Yes Place 1 drop into both eyes in the morning and at bedtime. [provider] Taking Active Multiple Informants  roflumilast (DALIRESP) 500 MCG TABS tablet [id number] Yes Take 1 tablet (500 mcg total) by mouth daily. [name], [name], MD Taking Active   sertraline (ZOLOFT) 100 MG tablet [id number] Yes Take 1 tablet (100 mg total) by mouth daily. [name], [name], MD Taking Active Multiple Informants  sertraline (ZOLOFT) 100 MG tablet [id number] Yes Take 1 tablet by mouth daily. [provider] Taking Active Multiple Informants  spironolactone (ALDACTONE) 25 MG tablet [id number] Yes Take by mouth. [provider] Taking Active   tamsulosin (FLOMAX) 0.4 MG CAPS capsule [id number] Yes Take 1 capsule (0.4 mg total) by mouth daily after supper. [name], [name], MD Taking Active Multiple Informants           Med Note Tressie Ellis Dec 14, 2019  4:07 PM)              Patient Active Problem List   Diagnosis Date Noted   Chronic respiratory failure with hypoxia ([place]) 10/20/2020   Insomnia    CHF (congestive heart failure) ([place]) 01/06/2018   Depression with anxiety 01/04/2018   CKD (chronic kidney disease),  stage III ([place]) 01/04/2018   COPD GOLD 3  01/04/2018   Swelling of lower extremity 11/30/2016   Morbid obesity ([place]) 08/21/2015   Coronary artery disease involving native coronary artery of native heart without angina pectoris 12/05/2014   Urinary urgency 10/15/2014   Spinal stenosis, lumbar region, with neurogenic claudication 01/02/2014   Hyperlipidemia associated with type 2 diabetes mellitus ([place]) 10/25/2013   Hyperlipidemia, unspecified 10/25/2013   COLONIC  POLYPS, ADENOMATOUS 02/25/2007   Diabetes mellitus type II, non insulin dependent ([place]) 02/25/2007   Gout 02/25/2007   Essential hypertension 02/25/2007   Cough variant asthma 02/25/2007   OSA on CPAP 02/25/2007   FATIGUE, CHRONIC 02/25/2007   HEADACHE, CHRONIC 02/25/2007   GERD (gastroesophageal reflux disease) 02/25/2007    Immunization History  Administered Date(s) Administered   Fluad Quad(high Dose 65+) 11/15/2018, 01/26/2021   Influenza Whole 10/09/2008   Influenza, High Dose Seasonal PF 11/29/2016, 11/08/2017   Influenza,inj,Quad PF,6+ Mos 12/04/2013, 12/04/2014, 12/23/2015   Pneumococcal Conjugate-13 12/04/2013   Pneumococcal Polysaccharide-23 08/26/2011   Td 11/08/2017    Conditions to be addressed/monitored: HTN and DMII  Care Plan : PHARMD MEDICATION MANAGMENT  Updates made by [name], [place] since 01/28/2021 12:00 AM     Problem: DISEASE PREVENTION PROGRESSION      Long-Range Goal: T2DM, HTN   Recent Progress: Not on track  Priority: High  Note:   Current Barriers:  Unable to independently afford treatment regimen Unable to maintain control of T2DM  Pharmacist Clinical Goal(s):  Over the next 90 days, patient will verbalize ability to afford treatment regimen maintain control of T2DM as evidenced by IMPROVED GLYCEMIC CONTROL  through collaboration with PharmD and provider.    Interventions: 1:1 collaboration with [name], [name], MD regarding development and update of comprehensive  plan of care as evidenced by provider attestation and co-signature Inter-disciplinary care team collaboration (see longitudinal plan of care) Comprehensive medication review performed; medication list updated in electronic medical record  Diabetes: Uncontrolled-A1C 7.9%; current treatment: OZEMPIC 0.25MG SQ WEEKLY, FARXIGA 10MG DAILY;  CONTINUE Ozempic 0.89m sq weekly Denies personal and family history of Medullary thyroid cancer (MTC) Enrolled in novo n[place]isk patient assistance Increase Farxiga to 123mDAILY--patient stable Escribed to medvantx Received notification from AZ&ME regarding approval [facility] approved UNTIL 02/07/21.  AUTO RE-ENROLLMENT FOR 2023 WILL BEGIN AFTER 02/07/21 & LAST UNTIL 02/07/22. Need to determine if patient is still taking glimepiride** recommended to discontinue, but still on med list Current glucose readings: fasting glucose <200, post prandial glucose: N/A Denies hypoglycemic/hyperglycemic symptoms Discussed meal planning options and Plate method for healthy eating Avoid sugary drinks and desserts Incorporate balanced protein, non starchy veggies, 1 serving of carbohydrate with each meal Increase water intake Increase physical activity as able Current exercise: UNABLE DUE TO CURRENT STATE--BLOOD PRESSURE LABILE; ENERGY LEVEL DECREASED Educated on medications-purpose & side effects Assessed patient finances. [facility]  They will no longer be servicing patient after this year and with certain medications. PLAN: [facility] Patient has enough Ozempic 0.2583mo last until we can reapply in November 2022.  We will be ordering patient's Breztri via Ast[employer] Warn[employer]ient assistance program--call placed to az&me to add breztri inhaler to patient's medications, [facility] (pharmacy for az&me patient  assistance)--RE-ENROLLED FOR 2023--MEDS TO SHIP TO PATIENT'S HOME.  Hypertension: Uncontrolled- BP goal <130/80; current treatment: bisoprolol, clonidine, hydralazine, isosorbide Current home readings:  traditional automatic cuff (reports labile from 100s-170s SBP) wrist cuff: 140/82, 123/71; PCP in office today 123/71 Reports hypotensive/hypertensive symptoms Counseled on taking all BP medications as prescribed and at the same time of day to keep steady control; BP is managed by cardiology, therefore patient instructed to call cards for any BP needs; patient to keep strict log of BP times and readings   Patient Goals/Self-Care Activities Over the next 90  days, patient will:  - take medications as prescribed check glucose DAILY (FASTING) OR IF SYMPTOMATIC , document, and provide at future appointments check blood pressure DAILY OR IF SYMPTOMATIC, document, and provide at future appointments  Follow Up Plan: Telephone follow up appointment with care management team member scheduled for: 1 month     Medication Assistance:  Pilar Grammes obtained through AZ&ME medication assistance program.  Enrollment ends 02/07/22 OZEMPIC OBTAINED VIA NOV[place] PATIENT ASSISTANCE PROGRAM UNTIL 02/07/21  Patient's preferred pharmacy is:  [facility] [address], [place] [place] [address] Phone: [phone] Fax: [phone]  [facility] [place], [place] [address] Phone: [phone] Fax: [place], [place] [place] [address] Phone: [phone] Fax: [phone]   Follow Up:  Patient agrees to Care Plan and Follow-up.  Plan: Telephone follow up appointment with care management team member scheduled for:  1 month   Regina Eck, PharmD, BCPS Clinical Pharmacist, [place]  II Phone [phone]

## 2021-01-30 ENCOUNTER — Other Ambulatory Visit: Payer: Self-pay | Admitting: Family Medicine

## 2021-01-30 DIAGNOSIS — R3915 Urgency of urination: Secondary | ICD-10-CM

## 2021-01-30 DIAGNOSIS — R350 Frequency of micturition: Secondary | ICD-10-CM

## 2021-01-30 NOTE — Telephone Encounter (Signed)
Please advise, Atorvastatin listed on med list but per protocol must send to provider, never prescribed here.

## 2021-02-20 ENCOUNTER — Telehealth: Payer: Self-pay | Admitting: Family Medicine

## 2021-02-20 NOTE — Telephone Encounter (Signed)
Pt's daughter calling because she asked about the wrong medication when she called earlier. The medication that she meant to ask Almyra Free about for patient assistance was daliresp. Please call back and advise.

## 2021-02-20 NOTE — Telephone Encounter (Signed)
Daughter has been informed that there is not an assistance program for the medication. Advised to try the manufacturer website or contact the urology office to see if they have any suggestions.

## 2021-02-20 NOTE — Telephone Encounter (Signed)
Please let patient know:  I'm not familiar with this medication "gemsta"  Is he referring to gemtesa (for overactive bladder)?  There is not an assistance program for that medication.  Recommendation would be for urology to change to a generic.  NO brand name meds for bladder have assistance programs.  We are getting his breztri inhaler and farxiga via patient assistance

## 2021-02-23 NOTE — Telephone Encounter (Signed)
Daughter made aware that Almyra Free would be in touch with them. Routed message back to [place] as a reminder.

## 2021-02-23 NOTE — Telephone Encounter (Signed)
He had multiple medications we were assisting with for patient assistance.  I will follow up and see what is going on with that medication (daliresp) since it is a new medication for him  I will follow up with patient/family within 1 week

## 2021-02-25 ENCOUNTER — Other Ambulatory Visit: Payer: PPO

## 2021-02-25 DIAGNOSIS — I129 Hypertensive chronic kidney disease with stage 1 through stage 4 chronic kidney disease, or unspecified chronic kidney disease: Secondary | ICD-10-CM | POA: Diagnosis not present

## 2021-02-25 DIAGNOSIS — R809 Proteinuria, unspecified: Secondary | ICD-10-CM | POA: Diagnosis not present

## 2021-02-25 DIAGNOSIS — E1129 Type 2 diabetes mellitus with other diabetic kidney complication: Secondary | ICD-10-CM | POA: Diagnosis not present

## 2021-02-25 DIAGNOSIS — I5042 Chronic combined systolic (congestive) and diastolic (congestive) heart failure: Secondary | ICD-10-CM | POA: Diagnosis not present

## 2021-02-25 DIAGNOSIS — E1122 Type 2 diabetes mellitus with diabetic chronic kidney disease: Secondary | ICD-10-CM | POA: Diagnosis not present

## 2021-02-25 DIAGNOSIS — N189 Chronic kidney disease, unspecified: Secondary | ICD-10-CM | POA: Diagnosis not present

## 2021-02-27 ENCOUNTER — Other Ambulatory Visit (HOSPITAL_COMMUNITY)
Admission: RE | Admit: 2021-02-27 | Discharge: 2021-02-27 | Disposition: A | Discharge status: Home / Self Care | Payer: PPO | Source: Ambulatory Visit | Attending: Internal Medicine | Admitting: Internal Medicine

## 2021-02-27 DIAGNOSIS — Z01818 Encounter for other preprocedural examination: Secondary | ICD-10-CM

## 2021-02-27 DIAGNOSIS — R0902 Hypoxemia: Secondary | ICD-10-CM | POA: Diagnosis not present

## 2021-02-27 DIAGNOSIS — G4733 Obstructive sleep apnea (adult) (pediatric): Secondary | ICD-10-CM | POA: Diagnosis not present

## 2021-02-27 DIAGNOSIS — Z20822 Contact with and (suspected) exposure to covid-19: Secondary | ICD-10-CM | POA: Insufficient documentation

## 2021-02-27 DIAGNOSIS — J449 Chronic obstructive pulmonary disease, unspecified: Secondary | ICD-10-CM | POA: Diagnosis not present

## 2021-02-27 DIAGNOSIS — I5032 Chronic diastolic (congestive) heart failure: Secondary | ICD-10-CM | POA: Diagnosis not present

## 2021-02-27 LAB — SARS CORONAVIRUS 2 (TAT 6-24 HRS): SARS Coronavirus 2: NEGATIVE

## 2021-03-01 DIAGNOSIS — J449 Chronic obstructive pulmonary disease, unspecified: Secondary | ICD-10-CM | POA: Diagnosis not present

## 2021-03-03 ENCOUNTER — Ambulatory Visit (HOSPITAL_COMMUNITY)
Admission: RE | Admit: 2021-03-03 | Discharge: 2021-03-03 | Disposition: A | Discharge status: Home / Self Care | Payer: PPO | Source: Ambulatory Visit | Attending: Internal Medicine | Admitting: Internal Medicine

## 2021-03-03 ENCOUNTER — Other Ambulatory Visit: Payer: Self-pay

## 2021-03-03 ENCOUNTER — Ambulatory Visit (HOSPITAL_COMMUNITY): Payer: PPO

## 2021-03-03 DIAGNOSIS — R809 Proteinuria, unspecified: Secondary | ICD-10-CM | POA: Diagnosis not present

## 2021-03-03 DIAGNOSIS — J45991 Cough variant asthma: Secondary | ICD-10-CM | POA: Insufficient documentation

## 2021-03-03 DIAGNOSIS — N189 Chronic kidney disease, unspecified: Secondary | ICD-10-CM | POA: Diagnosis not present

## 2021-03-03 DIAGNOSIS — I5042 Chronic combined systolic (congestive) and diastolic (congestive) heart failure: Secondary | ICD-10-CM | POA: Diagnosis not present

## 2021-03-03 DIAGNOSIS — I129 Hypertensive chronic kidney disease with stage 1 through stage 4 chronic kidney disease, or unspecified chronic kidney disease: Secondary | ICD-10-CM | POA: Diagnosis not present

## 2021-03-03 DIAGNOSIS — E559 Vitamin D deficiency, unspecified: Secondary | ICD-10-CM | POA: Diagnosis not present

## 2021-03-03 DIAGNOSIS — E1129 Type 2 diabetes mellitus with other diabetic kidney complication: Secondary | ICD-10-CM | POA: Diagnosis not present

## 2021-03-03 DIAGNOSIS — E1122 Type 2 diabetes mellitus with diabetic chronic kidney disease: Secondary | ICD-10-CM | POA: Diagnosis not present

## 2021-03-03 LAB — PULMONARY FUNCTION TEST
DL/VA % pred: 131 %
DL/VA: 5.29 ml/min/mmHg/L
DLCO unc % pred: 81 %
DLCO unc: 18.86 ml/min/mmHg
FEF 25-75 Post: 1.77 L/sec
FEF 25-75 Pre: 1.09 L/sec
FEF2575-%Change-Post: 62 %
FEF2575-%Pred-Post: 88 %
FEF2575-%Pred-Pre: 54 %
FEV1-%Change-Post: 12 %
FEV1-%Pred-Post: 62 %
FEV1-%Pred-Pre: 55 %
FEV1-Post: 1.71 L
FEV1-Pre: 1.51 L
FEV1FVC-%Change-Post: 1 %
FEV1FVC-%Pred-Pre: 102 %
FEV6-%Change-Post: 11 %
FEV6-%Pred-Post: 63 %
FEV6-%Pred-Pre: 57 %
FEV6-Post: 2.26 L
FEV6-Pre: 2.04 L
FEV6FVC-%Change-Post: 0 %
FEV6FVC-%Pred-Post: 106 %
FEV6FVC-%Pred-Pre: 106 %
FVC-%Change-Post: 11 %
FVC-%Pred-Post: 60 %
FVC-%Pred-Pre: 53 %
FVC-Post: 2.27 L
FVC-Pre: 2.04 L
Post FEV1/FVC ratio: 75 %
Post FEV6/FVC ratio: 100 %
Pre FEV1/FVC ratio: 74 %
Pre FEV6/FVC Ratio: 100 %
RV % pred: 86 %
RV: 2.05 L
TLC % pred: 65 %
TLC: 4.19 L

## 2021-03-03 MED ORDER — ALBUTEROL SULFATE (2.5 MG/3ML) 0.083% IN NEBU
2.5000 mg | INHALATION_SOLUTION | Freq: Once | RESPIRATORY_TRACT | Status: AC
  Administered 2021-03-03: 16:00:00 2.5 mg via RESPIRATORY_TRACT

## 2021-03-05 NOTE — Progress Notes (Signed)
Spoke with pt and notified of results per Dr. Wert. Pt verbalized understanding and denied any questions. 

## 2021-03-06 ENCOUNTER — Telehealth: Payer: PPO

## 2021-03-07 ENCOUNTER — Other Ambulatory Visit: Payer: Self-pay | Admitting: Family Medicine

## 2021-03-07 DIAGNOSIS — I5042 Chronic combined systolic (congestive) and diastolic (congestive) heart failure: Secondary | ICD-10-CM

## 2021-03-07 DIAGNOSIS — I10 Essential (primary) hypertension: Secondary | ICD-10-CM

## 2021-03-08 DIAGNOSIS — R0902 Hypoxemia: Secondary | ICD-10-CM | POA: Diagnosis not present

## 2021-03-08 DIAGNOSIS — G4733 Obstructive sleep apnea (adult) (pediatric): Secondary | ICD-10-CM | POA: Diagnosis not present

## 2021-03-08 DIAGNOSIS — I5032 Chronic diastolic (congestive) heart failure: Secondary | ICD-10-CM | POA: Diagnosis not present

## 2021-03-08 DIAGNOSIS — J449 Chronic obstructive pulmonary disease, unspecified: Secondary | ICD-10-CM | POA: Diagnosis not present

## 2021-03-09 ENCOUNTER — Encounter: Payer: Self-pay | Admitting: Family Medicine

## 2021-03-09 ENCOUNTER — Ambulatory Visit (INDEPENDENT_AMBULATORY_CARE_PROVIDER_SITE_OTHER): Payer: PPO | Admitting: Family Medicine

## 2021-03-09 VITALS — BP 118/66 | HR 67 | Temp 97.6°F | Ht 67.0 in | Wt 255.6 lb

## 2021-03-09 DIAGNOSIS — L03115 Cellulitis of right lower limb: Secondary | ICD-10-CM | POA: Diagnosis not present

## 2021-03-09 MED ORDER — AMOXICILLIN-POT CLAVULANATE 875-125 MG PO TABS: 1.0000 | ORAL_TABLET | Freq: Two times a day (BID) | ORAL | 0 refills | Status: DC

## 2021-03-09 NOTE — Progress Notes (Signed)
Subjective:  Patient ID: Kenneth Fuller, male    DOB: 24-Jan-1946  Age: 76 y.o. MRN: [id number]  CC: Leg Pain   HPI Kenneth Fuller presents for right leg. Sore and swollen at shin. A bit red last night. MOre pain last night. Onset 2 days ago. Has to limp to walk. No fever.   Depression screen [facility] 2/9 01/26/2021 01/26/2021 01/14/2021  Decreased Interest 0 1 1  Down, Depressed, Hopeless 0 1 1  PHQ - 2 Score 0 2 2  Altered sleeping 0 1 1  Tired, decreased energy 0 1 1  Change in appetite - 0 0  Feeling bad or failure about yourself  - 0 0  Trouble concentrating - 0 0  Moving slowly or fidgety/restless 0 1 1  Suicidal thoughts - 0 0  PHQ-9 Score 0 5 5  Difficult doing work/chores - - Somewhat difficult  Some recent data might be hidden    History Kenneth Fuller has a past medical history of Allergy, Asthma, CAD (coronary artery disease), CHF (congestive heart failure) ([place]), COPD (chronic obstructive pulmonary disease) ([place]), Diabetes mellitus, Fibromyalgia, GERD (gastroesophageal reflux disease), GI bleeding, Gout, Hyperlipidemia, Hypertension, Hypogonadism male, Insomnia, MRSA cellulitis, Neuropathy, Obesity, Shortness of breath dyspnea, Sleep apnea, and Wheezing.   He has a past surgical history that includes neck fusion; Back surgery; Colonoscopy; Cardiac catheterization; Lumbar laminectomy/decompression microdiscectomy (N/A, 01/02/2014); LEFT HEART CATH AND CORONARY ANGIOGRAPHY (N/A, 12/02/2016); Esophagogastroduodenoscopy (egd) with propofol (N/A, 12/20/2019); Colonoscopy with propofol (N/A, 12/20/2019); biopsy (12/20/2019); and polypectomy (12/20/2019).   His family history includes Colon cancer in his mother; Deep vein thrombosis in his brother; Diabetes in his father; Drug abuse in his sister; Heart attack in his father; Heart attack (age of onset: 28) in his son; Heart disease in his brother, brother, and father; Heart failure in his sister; Kidney disease in his sister.He reports  that he quit smoking about 52 years ago. His smoking use included cigarettes. He started smoking about 57 years ago. He smoked an average of 1 pack per day. He quit smokeless tobacco use about 52 years ago. He reports that he does not drink alcohol and does not use drugs.    ROS Review of Systems  Constitutional:  Positive for activity change. Negative for appetite change and fever.  HENT: Negative.    Musculoskeletal:  Positive for arthralgias and gait problem.   Objective:  BP 118/66    Pulse 67    Temp 97.6 F (36.4 C)    Ht _0  (1.702 m)    Wt 255 lb 9.6 oz (115.9 kg)    SpO2 94%    BMI 40.03 kg/m   BP Readings from Last 3 Encounters:  03/09/21 118/66  01/26/21 136/75  01/15/21 132/66    Wt Readings from Last 3 Encounters:  03/09/21 255 lb 9.6 oz (115.9 kg)  01/26/21 262 lb (118.8 kg)  01/15/21 267 lb (121.1 kg)     Physical Exam Vitals reviewed.  Constitutional:      Appearance: He is well-developed.  HENT:     Head: Normocephalic and atraumatic.     Right Ear: External ear normal.     Left Ear: External ear normal.     Mouth/Throat:     Pharynx: No oropharyngeal exudate or posterior oropharyngeal erythema.  Eyes:     Pupils: Pupils are equal, round, and reactive to light.  Cardiovascular:     Rate and Rhythm: Normal rate and regular rhythm.     Heart sounds:  No murmur heard. Pulmonary:     Effort: No respiratory distress.     Breath sounds: Normal breath sounds.  Musculoskeletal:        General: Tenderness present. No deformity. Normal range of motion.     Cervical back: Normal range of motion and neck supple.     Right lower leg: Edema (About 1+) present.  Skin:    Findings: Erythema (3 cm at the midshaft of the tibia anteriorly right shin) present.  Neurological:     Mental Status: He is alert and oriented to person, place, and time.      Assessment & Plan:   Kenneth Fuller was seen today for leg pain.  Diagnoses and all orders for this  visit:  Cellulitis of right lower extremity  Other orders -     amoxicillin-clavulanate (AUGMENTIN) 875-125 MG tablet; Take 1 tablet by mouth 2 (two) times daily. Take all of this medication       I am having Kenneth Fuller. Kenneth "Donnie" start on amoxicillin-clavulanate. I am also having him maintain his FeroSul, furosemide, Vitamin D3, Restasis, polyethylene glycol, albuterol, famotidine, nitroGLYCERIN, albuterol, OneTouch Ultra, isosorbide dinitrate, sertraline, Breztri Aerosphere, omeprazole, colchicine, cloNIDine, spironolactone, sertraline, ondansetron, nitroGLYCERIN, glimepiride, cycloSPORINE, clonazePAM, chlorthalidone, amitriptyline, hydrALAZINE, benzonatate, cetirizine, fluticasone, roflumilast, dapagliflozin propanediol, hydrALAZINE, gabapentin, olmesartan, atorvastatin, tamsulosin, and bisoprolol.  Allergies as of 03/09/2021       Reactions   Amlodipine Besy-benazepril Hcl Swelling, Other (See Comments)   Makes tongue swell (lotrel)   Hydrocodone Itching   Can tolerate in low doses   Oxycodone Itching   Phenergan [promethazine Hcl] Other (See Comments)   "I can't remember."        Medication List        Accurate as of March 09, 2021  5:43 PM. If you have any questions, ask your nurse or doctor.          albuterol 108 (90 Base) MCG/ACT inhaler Commonly known as: VENTOLIN HFA Inhale 2 puffs into the lungs every 6 (six) hours as needed for wheezing or shortness of breath.   albuterol (2.5 MG/3ML) 0.083% nebulizer solution Commonly known as: PROVENTIL Take 3 mLs (2.5 mg total) by nebulization every 6 (six) hours as needed for wheezing or shortness of breath.   amitriptyline 25 MG tablet Commonly known as: ELAVIL Take 25 mg by mouth at bedtime.   amoxicillin-clavulanate 875-125 MG tablet Commonly known as: AUGMENTIN Take 1 tablet by mouth 2 (two) times daily. Take all of this medication Started by: Claretta Fraise, MD   atorvastatin 40 MG tablet Commonly  known as: LIPITOR Take 1 tablet by mouth once daily   benzonatate 200 MG capsule Commonly known as: TESSALON Take 1 capsule (200 mg total) by mouth 2 (two) times daily as needed for cough.   bisoprolol 5 MG tablet Commonly known as: ZEBETA Take 1/2 (one-half) tablet by mouth once daily   Breztri Aerosphere 160-9-4.8 MCG/ACT Aero Generic drug: Budeson-Glycopyrrol-Formoterol Inhale 2 puffs into the lungs in the morning and at bedtime.   cetirizine 10 MG tablet Commonly known as: ZYRTEC Take 1 tablet (10 mg total) by mouth daily.   chlorthalidone 25 MG tablet Commonly known as: HYGROTON Take by mouth.   clonazePAM 0.5 MG tablet Commonly known as: KLONOPIN Take by mouth.   cloNIDine 0.2 MG tablet Commonly known as: CATAPRES TAKE 1 TABLET BY MOUTH THREE TIMES DAILY   colchicine 0.6 MG tablet Take 1 tablet by mouth once daily   dapagliflozin propanediol 10 MG Tabs tablet  Commonly known as: [name] Take 1 tablet (10 mg total) by mouth daily.   famotidine 20 MG tablet Commonly known as: PEPCID Take 1 tablet (20 mg total) by mouth at bedtime.   FeroSul 325 (65 FE) MG tablet Generic drug: ferrous sulfate Take 325 mg by mouth 3 (three) times daily.   fluticasone 50 MCG/ACT nasal spray Commonly known as: FLONASE Place 2 sprays into both nostrils daily.   furosemide 40 MG tablet Commonly known as: LASIX Take 40 mg by mouth daily as needed (fluid retention.).   gabapentin 300 MG capsule Commonly known as: NEURONTIN 1 cap twice daily and 2 caps at bedtime   glimepiride 4 MG tablet Commonly known as: AMARYL Take by mouth.   hydrALAZINE 25 MG tablet Commonly known as: APRESOLINE TAKE 3 TABLETS BY MOUTH THREE TIMES DAILY   hydrALAZINE 50 MG tablet Commonly known as: APRESOLINE TAKE 1 TABLET BY MOUTH THREE TIMES DAILY   isosorbide dinitrate 20 MG tablet Commonly known as: ISORDIL Take 1 tablet (20 mg total) by mouth 3 (three) times daily.   nitroGLYCERIN 0.4 MG  SL tablet Commonly known as: NITROSTAT Place 1 tablet (0.4 mg total) under the tongue every 5 (five) minutes x 3 doses as needed for chest pain.   nitroGLYCERIN 0.4 MG SL tablet Commonly known as: NITROSTAT Place under the tongue.   olmesartan 20 MG tablet Commonly known as: Benicar Take 1 tablet (20 mg total) by mouth daily.   omeprazole 40 MG capsule Commonly known as: PRILOSEC TAKE 1 CAPSULE BY MOUTH ONCE DAILY 30 TO 60 MINUTES PRIOR TO BREAKFAST What changed: See the new instructions.   ondansetron 4 MG tablet Commonly known as: ZOFRAN Take by mouth.   OneTouch Ultra test strip Generic drug: glucose blood USE 1 STRIP TO CHECK GLUCOSE ONCE DAILY   polyethylene glycol 17 g packet Commonly known as: MIRALAX / GLYCOLAX Take 17 g by mouth as needed for mild constipation.   Restasis 0.05 % ophthalmic emulsion Generic drug: cycloSPORINE Place 1 drop into both eyes in the morning and at bedtime.   cycloSPORINE 0.05 % ophthalmic emulsion Commonly known as: RESTASIS Apply to eye.   roflumilast 500 MCG Tabs tablet Commonly known as: DALIRESP Take 1 tablet (500 mcg total) by mouth daily.   sertraline 100 MG tablet Commonly known as: ZOLOFT Take 1 tablet by mouth daily.   sertraline 100 MG tablet Commonly known as: ZOLOFT Take 1 tablet (100 mg total) by mouth daily.   spironolactone 25 MG tablet Commonly known as: ALDACTONE Take by mouth.   tamsulosin 0.4 MG Caps capsule Commonly known as: FLOMAX TAKE 1 CAPSULE BY MOUTH ONCE DAILY AFTER SUPPER   Vitamin D3 125 MCG (5000 UT) Tabs Take 5,000 Units by mouth daily.         Follow-up: Return if symptoms worsen or fail to improve.  Claretta Fraise, M.D.

## 2021-03-10 ENCOUNTER — Telehealth: Payer: Self-pay | Admitting: Family Medicine

## 2021-03-10 ENCOUNTER — Other Ambulatory Visit: Payer: Self-pay | Admitting: Family Medicine

## 2021-03-10 DIAGNOSIS — J45991 Cough variant asthma: Secondary | ICD-10-CM

## 2021-03-10 NOTE — Telephone Encounter (Signed)
Pts daughter called to get update from [place] on patient assistance for [employer].   Please advise and call daughter at [phone]

## 2021-03-11 NOTE — Telephone Encounter (Signed)
Can you follow up with daliresp via az&me & let patient's daughter know Please advise and call daughter at [phone] New rx added last week to az&me

## 2021-03-12 ENCOUNTER — Ambulatory Visit (INDEPENDENT_AMBULATORY_CARE_PROVIDER_SITE_OTHER): Payer: PPO

## 2021-03-12 ENCOUNTER — Telehealth: Payer: Self-pay | Admitting: Family Medicine

## 2021-03-12 DIAGNOSIS — J449 Chronic obstructive pulmonary disease, unspecified: Secondary | ICD-10-CM

## 2021-03-12 DIAGNOSIS — E119 Type 2 diabetes mellitus without complications: Secondary | ICD-10-CM

## 2021-03-12 DIAGNOSIS — N1832 Chronic kidney disease, stage 3b: Secondary | ICD-10-CM

## 2021-03-12 DIAGNOSIS — I2510 Atherosclerotic heart disease of native coronary artery without angina pectoris: Secondary | ICD-10-CM

## 2021-03-12 DIAGNOSIS — F339 Major depressive disorder, recurrent, unspecified: Secondary | ICD-10-CM

## 2021-03-12 DIAGNOSIS — F418 Other specified anxiety disorders: Secondary | ICD-10-CM

## 2021-03-12 DIAGNOSIS — I10 Essential (primary) hypertension: Secondary | ICD-10-CM

## 2021-03-12 DIAGNOSIS — I5042 Chronic combined systolic (congestive) and diastolic (congestive) heart failure: Secondary | ICD-10-CM

## 2021-03-12 NOTE — Chronic Care Management (AMB) (Signed)
Chronic Care Management    Clinical Social Work Note  03/12/2021 Name: Kenneth Fuller MRN: [id number] DOB: Sep 04, 1945  Kenneth Fuller is a 76 y.o. year old male who is a primary care patient of [name], [name], MD. The CCM team was consulted to assist the patient with chronic disease management and/or care coordination needs related to: [name] .   Engaged with patient by telephone for follow up visit in response to provider referral for social work chronic care management and care coordination services.   Consent to Services:  The patient was given information about Chronic Care Management services, agreed to services, and gave verbal consent prior to initiation of services.  Please see initial visit note for detailed documentation.   Patient agreed to services and consent obtained.   Assessment: Review of patient past medical history, allergies, medications, and health status, including review of relevant consultants reports was performed today as part of a comprehensive evaluation and provision of chronic care management and care coordination services.     SDOH (Social Determinants of Health) assessments and interventions performed:  SDOH Interventions    Flowsheet Row Most Recent Value  SDOH Interventions   Physical Activity Interventions Other (Comments)  [walking challenges]  Stress Interventions Provide Counseling  [client has stress related to managing medical needs]  Depression Interventions/Treatment  Currently on Treatment        Advanced Directives Status: See [employer] application for related entries.  CCM Care Plan  Allergies  Allergen Reactions   Amlodipine Besy-Benazepril Hcl Swelling and Other (See Comments)    Makes tongue swell (lotrel)   Hydrocodone Itching    Can tolerate in low doses   Oxycodone Itching   Phenergan [Promethazine Hcl] Other (See Comments)    "I can't remember."    Outpatient Encounter Medications as of 03/12/2021   Medication Sig Note   albuterol (PROVENTIL) (2.5 MG/3ML) 0.083% nebulizer solution USE 1 VIAL IN NEBULIZER EVERY 6 HOURS AS NEEDED FOR WHEEZING AND FOR SHORTNESS OF BREATH    albuterol (VENTOLIN HFA) 108 (90 Base) MCG/ACT inhaler Inhale 2 puffs into the lungs every 6 (six) hours as needed for wheezing or shortness of breath.    amitriptyline (ELAVIL) 25 MG tablet Take 25 mg by mouth at bedtime.    amoxicillin-clavulanate (AUGMENTIN) 875-125 MG tablet Take 1 tablet by mouth 2 (two) times daily. Take all of this medication    atorvastatin (LIPITOR) 40 MG tablet Take 1 tablet by mouth once daily    benzonatate (TESSALON) 200 MG capsule Take 1 capsule (200 mg total) by mouth 2 (two) times daily as needed for cough.    bisoprolol (ZEBETA) 5 MG tablet Take 1/2 (one-half) tablet by mouth once daily    Budeson-Glycopyrrol-Formoterol (BREZTRI AEROSPHERE) 160-9-4.8 MCG/ACT AERO Inhale 2 puffs into the lungs in the morning and at bedtime. 11/27/2020: VIA AZ&ME PATIENT ASSISTANCE   cetirizine (ZYRTEC) 10 MG tablet Take 1 tablet (10 mg total) by mouth daily.    chlorthalidone (HYGROTON) 25 MG tablet Take by mouth.    Cholecalciferol (VITAMIN D3) 125 MCG (5000 UT) TABS Take 5,000 Units by mouth daily.    clonazePAM (KLONOPIN) 0.5 MG tablet Take by mouth.    cloNIDine (CATAPRES) 0.2 MG tablet TAKE 1 TABLET BY MOUTH THREE TIMES DAILY    colchicine 0.6 MG tablet Take 1 tablet by mouth once daily    cycloSPORINE (RESTASIS) 0.05 % ophthalmic emulsion Apply to eye.    dapagliflozin propanediol (FARXIGA) 10 MG TABS tablet Take  1 tablet (10 mg total) by mouth daily. 11/27/2020: VIA AZ&ME PATIENT ASSISTANCE    famotidine (PEPCID) 20 MG tablet Take 1 tablet (20 mg total) by mouth at bedtime.    FEROSUL 325 (65 Fe) MG tablet Take 325 mg by mouth 3 (three) times daily.    fluticasone (FLONASE) 50 MCG/ACT nasal spray Place 2 sprays into both nostrils daily.    furosemide (LASIX) 40 MG tablet Take 40 mg by mouth daily as  needed (fluid retention.).    gabapentin (NEURONTIN) 300 MG capsule 1 cap twice daily and 2 caps at bedtime    glimepiride (AMARYL) 4 MG tablet Take by mouth.    hydrALAZINE (APRESOLINE) 25 MG tablet TAKE 3 TABLETS BY MOUTH THREE TIMES DAILY    hydrALAZINE (APRESOLINE) 50 MG tablet TAKE 1 TABLET BY MOUTH THREE TIMES DAILY    isosorbide dinitrate (ISORDIL) 20 MG tablet Take 1 tablet (20 mg total) by mouth 3 (three) times daily.    nitroGLYCERIN (NITROSTAT) 0.4 MG SL tablet Place 1 tablet (0.4 mg total) under the tongue every 5 (five) minutes x 3 doses as needed for chest pain. 07/15/2020: On hand   nitroGLYCERIN (NITROSTAT) 0.4 MG SL tablet Place under the tongue.    olmesartan (BENICAR) 20 MG tablet Take 1 tablet (20 mg total) by mouth daily.    omeprazole (PRILOSEC) 40 MG capsule TAKE 1 CAPSULE BY MOUTH ONCE DAILY 30 TO 60 MINUTES PRIOR TO BREAKFAST (Patient taking differently: Take 40 mg by mouth daily.)    ondansetron (ZOFRAN) 4 MG tablet Take by mouth.    ONETOUCH ULTRA test strip USE 1 STRIP TO CHECK GLUCOSE ONCE DAILY    polyethylene glycol (MIRALAX / GLYCOLAX) 17 g packet Take 17 g by mouth as needed for mild constipation.    RESTASIS 0.05 % ophthalmic emulsion Place 1 drop into both eyes in the morning and at bedtime.    roflumilast (DALIRESP) 500 MCG TABS tablet Take 1 tablet (500 mcg total) by mouth daily.    sertraline (ZOLOFT) 100 MG tablet Take 1 tablet (100 mg total) by mouth daily.    sertraline (ZOLOFT) 100 MG tablet Take 1 tablet by mouth daily.    spironolactone (ALDACTONE) 25 MG tablet Take by mouth.    tamsulosin (FLOMAX) 0.4 MG CAPS capsule TAKE 1 CAPSULE BY MOUTH ONCE DAILY AFTER SUPPER    No facility-administered encounter medications on file as of 03/12/2021.    Patient Active Problem List   Diagnosis Date Noted   Chronic respiratory failure with hypoxia ([place]) 10/20/2020   Insomnia    CHF (congestive heart failure) ([place]) 01/06/2018   Depression with anxiety 01/04/2018    CKD (chronic kidney disease), stage III ([place]) 01/04/2018   COPD GOLD 0/  AB 01/04/2018   Swelling of lower extremity 11/30/2016   Morbid obesity ([place]) 08/21/2015   Coronary artery disease involving native coronary artery of native heart without angina pectoris 12/05/2014   Urinary urgency 10/15/2014   Spinal stenosis, lumbar region, with neurogenic claudication 01/02/2014   Hyperlipidemia associated with type 2 diabetes mellitus ([place]) 10/25/2013   Hyperlipidemia, unspecified 10/25/2013   COLONIC POLYPS, ADENOMATOUS 02/25/2007   Diabetes mellitus type II, non insulin dependent ([place]) 02/25/2007   Gout 02/25/2007   Essential hypertension 02/25/2007   Cough variant asthma 02/25/2007   OSA on CPAP 02/25/2007   FATIGUE, CHRONIC 02/25/2007   HEADACHE, CHRONIC 02/25/2007   GERD (gastroesophageal reflux disease) 02/25/2007    Conditions to be addressed/monitored: monitor client management of health  issues faced by client  Care Plan : LCSW Care plan  Updates made by Katha Cabal, LCSW since 03/12/2021 12:00 AM     Problem: Coping Skills (General Plan of Care)      Goal: Manage Health needs and manage anxiety or stress issues related to health needs   Start Date: 03/12/2021  Expected End Date: 06/08/2021  This Visit's Progress: On track  Recent Progress: On track  Priority: Medium  Note:   Current barriers:   Patient in need of assistance with connecting to community resources for possible assistance in managing his ongoing medical needs Patient is unable to independently navigate community resource options without care coordination support Mobility issues Pain issues Breathing challenges  Clinical Goals:  patient will work with SW in next 30 days to address concerns related to health issues of client and management of health issues of client Patient will call RNCM or LCSW as needed in next 30 days for CCM support Patient will attend all scheduled client medical appointments in  next 30 days  Clinical Interventions:  Collaboration with [name], [name], MD regarding development and update of comprehensive plan of care as evidenced by provider attestation and co-signature Discussed with client the breathing issues of client (client said he uses inhaler as needed and uses nebulizer for treatments as prescribed. Client said he uses oxygen at night to help him breath better) Encouraged client to talk with [facility] as needed for CCM nursing support Reviewed with client sleeping challenges of client. He said he uses Bi-Pap to help him sleep at night. He said it has been challenging to use Bi-Pap lately due to his coughing and his congestion Reviewed with client his family support (has support from his wife and daughter, has support from his son; has support from his granddaughter) Reviewed with client the medication procurement of client. Reviewed mood status of client. Gracyn said he is taking medications as prescribed.  He said he thought his mood was stable. He did not mention any mood problems. He has said that he has strong family support and that this is very helpful to him. Reviewed support of Pulmonologist. He has appointment  next week with Dr. Melvyn Novas, [place].  Client said he has appointment with cardiologist on 04/17/21. Discussed client ambulation. He said he sometimes gets short of breath when walking.  He said he gets short of breath occasionally in walking in the home. He said he also gets short of breath when walking outdoors or walking longer distances. Discussed ADLs completion of client. He said he can complete ADLs without assistance. He said also that he has no transportation needs Provided counseling support for client Encouraged client to call LCSW as needed in next 30 days for CCM SW support  Patient Coping Skills/Strengths: Has family support Has no transport issues Attends scheduled medical appointments Takes medications as prescribed Completes  ADLs independently  Patient Deficits: Some mobility challenges Pain issues Breathing challenges  Patient Goals:  Patient will attend all scheduled client medical appointments in next 30 days Patient will call RNCM or LCSW as needed for CCM support in next 30 days Patient will communicate regularly with his spouse in next 30 days to discuss needs of client  Follow Up Plan:  LCSW to call client or his spouse on 05/06/21 at 1:00 PM to assess client needs      [name].Tarrence Enck MSW, [place] [employer] representative [facility] Care Management [phone]

## 2021-03-12 NOTE — Patient Instructions (Addendum)
Visit Information  Patient goals:  Protect My Health (Patient). Manage Health issues of client. Manage anxiety issues of client  Timeframe:  Short Term Goal Priority:  Medium Progress; On Track  Start Date:  03/12/21                                 Expected End Date:  06/08/20                   Follow Up Date 05/06/21  at 1:00 PM     Protect My Health (Patient)  Manage Health issues of client; manage anxiety issues of client    Why is this important?   Screening tests can find diseases early when they are easier to treat.  Your doctor or nurse will talk with you about which tests are important for you.  Getting shots for common diseases like the flu and shingles will help prevent them.     Patient Coping Skills/Strengths: Has family support Has no transport issues Attends scheduled medical appointments Takes medications as prescribed Completes ADLs independently  Patient Deficits: Some mobility challenges Pain issues Breathing challenges  Patient Goals:  Patient will attend all scheduled client medical appointments in next 30 days Patient will call RNCM or LCSW as needed for CCM support in next 30 days Patient will communicate regularly with his spouse in next 30 days to discuss needs of client  Follow Up Plan:  LCSW to call client or his spouse on 05/06/21 at 1:00 PM to assess needs of client  [name].Carmalita Wakefield MSW, [place] [employer] representative [facility] Care Management [phone]

## 2021-03-12 NOTE — Telephone Encounter (Signed)
PT WAS CALLED BY JULIE ASSISTANT

## 2021-03-13 ENCOUNTER — Encounter: Payer: Self-pay | Admitting: Nurse Practitioner

## 2021-03-13 ENCOUNTER — Ambulatory Visit (INDEPENDENT_AMBULATORY_CARE_PROVIDER_SITE_OTHER): Payer: PPO | Admitting: Nurse Practitioner

## 2021-03-13 VITALS — BP 159/77 | HR 71 | Temp 98.1°F | Resp 20 | Ht 67.0 in | Wt 260.0 lb

## 2021-03-13 DIAGNOSIS — J209 Acute bronchitis, unspecified: Secondary | ICD-10-CM

## 2021-03-13 DIAGNOSIS — J440 Chronic obstructive pulmonary disease with acute lower respiratory infection: Secondary | ICD-10-CM

## 2021-03-13 DIAGNOSIS — R0981 Nasal congestion: Secondary | ICD-10-CM | POA: Diagnosis not present

## 2021-03-13 LAB — VERITOR FLU A/B WAIVED
Influenza A: NEGATIVE
Influenza B: NEGATIVE

## 2021-03-13 MED ORDER — PREDNISONE 20 MG PO TABS: 40.0000 mg | ORAL_TABLET | Freq: Every day | ORAL | 0 refills | Status: DC

## 2021-03-13 MED ORDER — METHYLPREDNISOLONE ACETATE 40 MG/ML IJ SUSP
40.0000 mg | Freq: Once | INTRAMUSCULAR | Status: AC
  Administered 2021-03-13: 40 mg via INTRAMUSCULAR

## 2021-03-13 MED ORDER — PROMETHAZINE-DM 6.25-15 MG/5ML PO SYRP: 5.0000 mL | ORAL_SOLUTION | Freq: Four times a day (QID) | ORAL | 0 refills | Status: DC | PRN

## 2021-03-13 NOTE — Progress Notes (Signed)
Subjective:    Patient ID: Kenneth Fuller, male    DOB: [birth year], 76 y.o.   MRN: [id number]   Chief Complaint: URI  URI  This is a new problem. The current episode started in the past 7 days. The problem has been gradually worsening. There has been no fever. Associated symptoms include congestion, coughing, rhinorrhea, a sore throat and wheezing. Pertinent negatives include no ear pain, headaches or nausea. Was seen  Monday for sore on his leg and was given antibiotic, augmentin. Has not helped his sore throat or cough.     Review of Systems  Constitutional:  Positive for fatigue. Negative for chills and fever.  HENT:  Positive for congestion, rhinorrhea and sore throat. Negative for ear pain.   Respiratory:  Positive for cough and wheezing.   Gastrointestinal:  Negative for nausea.  Neurological:  Negative for headaches.      Objective:   Physical Exam Vitals reviewed.  Constitutional:      Appearance: Normal appearance. He is obese.  HENT:     Right Ear: Tympanic membrane normal.     Left Ear: Tympanic membrane normal.     Nose: Congestion and rhinorrhea present.     Mouth/Throat:     Mouth: Mucous membranes are moist.     Pharynx: No oropharyngeal exudate or posterior oropharyngeal erythema.  Cardiovascular:     Rate and Rhythm: Normal rate and regular rhythm.     Heart sounds: Normal heart sounds.  Pulmonary:     Effort: Pulmonary effort is normal.     Breath sounds: Wheezing and rhonchi (exp in bases) present.  Skin:    General: Skin is warm.  Neurological:     General: No focal deficit present.     Mental Status: He is alert and oriented to person, place, and time.  Psychiatric:        Mood and Affect: Mood normal.        Behavior: Behavior normal.   BP (!) 159/77    Pulse 71    Temp 98.1 F (36.7 C) (Temporal)    Resp 20    Ht _0  (1.702 m)    Wt 260 lb (117.9 kg)    SpO2 93%    BMI 40.72 kg/m         Assessment & Plan:  [name] Pingley in  today with chief complaint of URI   1. Nasal congestion - Veritor Flu A/B Waived - Novel Coronavirus, NAA ([facility])  2. Acute bronchitis with COPD ([place]) 1. Take meds as prescribed 2. Use a cool mist humidifier especially during the winter months and when heat has been humid. 3. Use saline nose sprays frequently 4. Saline irrigations of the nose can be very helpful if done frequently.  * 4X daily for 1 week*  * Use of a nettie pot can be helpful with this. Follow directions with this* 5. Drink plenty of fluids 6. Keep thermostat turn down low 7.For any cough or congestion- as prescribed 8. For fever or aces or pains- take tylenol or ibuprofen appropriate for age and weight.  * for fevers greater than 101 orally you may alternate ibuprofen and tylenol every  3 hours.    - methylPREDNISolone acetate (DEPO-MEDROL) injection 40 mg - predniSONE (DELTASONE) 20 MG tablet; Take 2 tablets (40 mg total) by mouth daily with breakfast for 5 days. 2 po daily for 5 days  Dispense: 10 tablet; Refill: 0 - promethazine-dextromethorphan (PROMETHAZINE-DM) 6.25-15 MG/5ML syrup; Take 5  mLs by mouth 4 (four) times daily as needed for cough.  Dispense: 118 mL; Refill: 0    The above assessment and management plan was discussed with the patient. The patient verbalized understanding of and has agreed to the management plan. Patient is aware to call the clinic if symptoms persist or worsen. Patient is aware when to return to the clinic for a follow-up visit. Patient educated on when it is appropriate to go to the emergency department.   Mary-Margaret Hassell Done, FNP

## 2021-03-13 NOTE — Patient Instructions (Signed)
1. Take meds as prescribed 2. Use a cool mist humidifier especially during the winter months and when heat has been humid. 3. Use saline nose sprays frequently 4. Saline irrigations of the nose can be very helpful if done frequently.  * 4X daily for 1 week*  * Use of a nettie pot can be helpful with this. Follow directions with this* 5. Drink plenty of fluids 6. Keep thermostat turn down low 7.For any cough or congestion- as prescribed 8. For fever or aces or pains- take tylenol or ibuprofen appropriate for age and weight.  * for fevers greater than 101 orally you may alternate ibuprofen and tylenol every  3 hours.

## 2021-03-14 LAB — SARS-COV-2, NAA 2 DAY TAT

## 2021-03-14 LAB — NOVEL CORONAVIRUS, NAA: SARS-CoV-2, NAA: NOT DETECTED

## 2021-03-18 ENCOUNTER — Encounter: Payer: Self-pay | Admitting: Pulmonary Disease

## 2021-03-18 ENCOUNTER — Ambulatory Visit (INDEPENDENT_AMBULATORY_CARE_PROVIDER_SITE_OTHER): Payer: PPO | Admitting: Pulmonary Disease

## 2021-03-18 ENCOUNTER — Other Ambulatory Visit: Payer: Self-pay

## 2021-03-18 DIAGNOSIS — G4733 Obstructive sleep apnea (adult) (pediatric): Secondary | ICD-10-CM

## 2021-03-18 DIAGNOSIS — J45991 Cough variant asthma: Secondary | ICD-10-CM | POA: Diagnosis not present

## 2021-03-18 DIAGNOSIS — Z9989 Dependence on other enabling machines and devices: Secondary | ICD-10-CM | POA: Diagnosis not present

## 2021-03-18 MED ORDER — PREDNISONE 10 MG PO TABS: ORAL_TABLET | ORAL | 0 refills | Status: AC

## 2021-03-18 NOTE — Assessment & Plan Note (Signed)
Even though spirometry does not suggest airway obstruction, he does have some reversibility with bronchodilator suggesting a component of asthma.  His smoking history is not impressive, he has a remote history of asthma, he may have airway remodeling now, functionally to be treated for COPD. He is having repeated exacerbations including this particular one we will treat him with a longer course of steroids, he is on antibiotic already. We will continue on Daliresp

## 2021-03-18 NOTE — Progress Notes (Signed)
° °  Subjective:    Patient ID: Kenneth Fuller, male    DOB: 12-29-1945, 76 y.o.   MRN: [id number]  HPI  7-yo remote smoker for FU of asthma/ COPD and OSA Initial visit 08/2020, He has previously seen Dr. Chase Caller and other providers at [facility] location   [place] - chronic diastolic heart failure coronary artery disease diabetes, stage3 CKD -asthma as a child , failed medical exam , recurred 2018          He smoked less than 10 pack years before he quit in 1971.  He reports lifelong history of asthma.  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   Chief Complaint  Patient presents with   Follow-up    Bipap working well but hasnt worn in a couple of weeks because of a cough and congestion    About 2 weeks ago he developed cough and congestion, productive yellow-green phlegm, COVID and flu testing was negative, his wife was sick with a URI infection.  Treated with Augmentin, completed course of prednisone today. Continues to have cough and wheezing.  Compliant with Breztri Accompanied by his daughter who corroborates history He feels that Daliresp has worked well for him overall More history was obtained regarding asthma  Unable to use BiPAP last 2 weeks, overall wants to know about alternative treatment measures including inspire.  Has not needed Lasix lately since no pedal edema  We reviewed PFTs and last imaging studies  Significant tests/ events reviewed  NPSG 06/2020 >> BiPAP 15/11 ,med FF mask Previous sleep study showed very severe OSA, AHI >100. Split night study showed optimal control with CPAP 5-20     PFTs 01/2021 no airway obstruction, moderate restriction ratio 74, FEV1 55%, FVC 53%, 12% bronchodilator response, FEV1 improved to 62%, DLCO 81%   12/2016 PFT >> ratio 84, severe restriction with reduced diffusion capacity.  Total lung capacity 72% FVC, 1.6 L / 40% and DLCO of 19.4/68%.   PFT 1/ 2019 showed FEV1 45%, ratio  68, FVC 48% consistent with moderate to severe airflow obstruction   Feno 01/26/2017 ->  8ppb     High resolution CT chest 01/2017 neg for ILD   CT angiogram chest 05/2020 clear lungs CT sinus March 09, 2017 clear sinuses      Review of Systems neg for any significant sore throat, dysphagia, itching, sneezing, nasal congestion or excess/ purulent secretions, fever, chills, sweats, unintended wt loss, pleuritic or exertional cp, hempoptysis, orthopnea pnd or change in chronic leg swelling. Also denies presyncope, palpitations, heartburn, abdominal pain, nausea, vomiting, diarrhea or change in bowel or urinary habits, dysuria,hematuria, rash, arthralgias, visual complaints, headache, numbness weakness or ataxia.      Objective:   Physical Exam  Gen. Pleasant, obese, in no distress ENT - no lesions, no post nasal drip Neck: No JVD, no thyromegaly, no carotid bruits Lungs: no use of accessory muscles, no dullness to percussion, decreased without rales , faint scattered rhonchi  Cardiovascular: Rhythm regular, heart sounds  normal, no murmurs or gallops, no peripheral edema Musculoskeletal: No deformities, no cyanosis or clubbing , no tremors       Assessment & Plan:

## 2021-03-18 NOTE — Assessment & Plan Note (Signed)
Should be able to get back on BiPAP once his cough and congestion improves.  We discussed hypoglossal nerve stimulation and due to his BMI he is not a candidate currently

## 2021-03-18 NOTE — Patient Instructions (Signed)
X Prednisone 10 mg tabs Take 4 tabs  daily with food x 4 days, then 3 tabs daily x 4 days, then 2 tabs daily x 4 days, then 1 tab daily x4 days then stop. #40  Get back on BiPAP

## 2021-03-20 NOTE — Telephone Encounter (Signed)
Daughter aware

## 2021-03-20 NOTE — Telephone Encounter (Signed)
Please let patient's daughter know the daliresp has been approved via az[employer]me patient assistance program until 02/07/22  They will need to schedule appt with me in November to re-enroll for programs next year

## 2021-03-26 DIAGNOSIS — I25118 Atherosclerotic heart disease of native coronary artery with other forms of angina pectoris: Secondary | ICD-10-CM | POA: Diagnosis not present

## 2021-03-26 DIAGNOSIS — M456 Ankylosing spondylitis lumbar region: Secondary | ICD-10-CM | POA: Diagnosis not present

## 2021-03-26 DIAGNOSIS — I5032 Chronic diastolic (congestive) heart failure: Secondary | ICD-10-CM | POA: Diagnosis not present

## 2021-03-26 DIAGNOSIS — I739 Peripheral vascular disease, unspecified: Secondary | ICD-10-CM | POA: Diagnosis not present

## 2021-03-26 DIAGNOSIS — I130 Hypertensive heart and chronic kidney disease with heart failure and stage 1 through stage 4 chronic kidney disease, or unspecified chronic kidney disease: Secondary | ICD-10-CM | POA: Diagnosis not present

## 2021-03-26 DIAGNOSIS — F3342 Major depressive disorder, recurrent, in full remission: Secondary | ICD-10-CM | POA: Diagnosis not present

## 2021-03-26 DIAGNOSIS — F419 Anxiety disorder, unspecified: Secondary | ICD-10-CM | POA: Diagnosis not present

## 2021-03-26 DIAGNOSIS — I252 Old myocardial infarction: Secondary | ICD-10-CM | POA: Diagnosis not present

## 2021-03-26 DIAGNOSIS — N183 Chronic kidney disease, stage 3 unspecified: Secondary | ICD-10-CM | POA: Diagnosis not present

## 2021-03-26 DIAGNOSIS — G629 Polyneuropathy, unspecified: Secondary | ICD-10-CM | POA: Diagnosis not present

## 2021-03-26 DIAGNOSIS — J449 Chronic obstructive pulmonary disease, unspecified: Secondary | ICD-10-CM | POA: Diagnosis not present

## 2021-03-27 ENCOUNTER — Ambulatory Visit: Payer: PPO | Admitting: Pharmacist

## 2021-03-27 DIAGNOSIS — J9611 Chronic respiratory failure with hypoxia: Secondary | ICD-10-CM

## 2021-03-27 DIAGNOSIS — J441 Chronic obstructive pulmonary disease with (acute) exacerbation: Secondary | ICD-10-CM

## 2021-03-27 DIAGNOSIS — R3915 Urgency of urination: Secondary | ICD-10-CM

## 2021-03-27 DIAGNOSIS — R0602 Shortness of breath: Secondary | ICD-10-CM

## 2021-03-27 DIAGNOSIS — N1832 Chronic kidney disease, stage 3b: Secondary | ICD-10-CM

## 2021-03-27 DIAGNOSIS — J410 Simple chronic bronchitis: Secondary | ICD-10-CM

## 2021-03-27 MED ORDER — BREZTRI AEROSPHERE 160-9-4.8 MCG/ACT IN AERO: 2.0000 | INHALATION_SPRAY | Freq: Two times a day (BID) | RESPIRATORY_TRACT | 4 refills | Status: DC

## 2021-03-27 MED ORDER — ROFLUMILAST 500 MCG PO TABS: 500.0000 ug | ORAL_TABLET | Freq: Every day | ORAL | 5 refills | Status: DC

## 2021-03-27 MED ORDER — MIRABEGRON ER 25 MG PO TB24: 25.0000 mg | ORAL_TABLET | Freq: Every day | ORAL | 3 refills | Status: DC

## 2021-03-27 MED ORDER — DAPAGLIFLOZIN PROPANEDIOL 10 MG PO TABS: 10.0000 mg | ORAL_TABLET | Freq: Every day | ORAL | 11 refills | Status: DC

## 2021-03-27 NOTE — Patient Instructions (Signed)
Visit Information  Following are the goals we discussed today:  Current Barriers:  Unable to independently afford treatment regimen Unable to maintain control of T2DM  Pharmacist Clinical Goal(s):  Over the next 90 days, patient will verbalize ability to afford treatment regimen maintain control of T2DM as evidenced by IMPROVED GLYCEMIC CONTROL  through collaboration with PharmD and provider.    Interventions: 1:1 collaboration with [name], [name], MD regarding development and update of comprehensive plan of care as evidenced by provider attestation and co-signature Inter-disciplinary care team collaboration (see longitudinal plan of care) Comprehensive medication review performed; medication list updated in electronic medical record  Diabetes: Uncontrolled-A1C 7.9%; current treatment: FARXIGA 10MG DAILY, glimepiride Continue Farxiga to 8m DAILY--patient stable Escribed to [employer] Received notification from [employer] regarding [facility] approved UNTIL 02/07/21.  AUTO RE-ENROLLMENT FOR 2023 WILL BEGIN AFTER 02/07/21 & LAST UNTIL 02/07/22. Discussed with patient about resuming ozempic low dose (if able to get) and d/c glimepiride; will discuss at PCP f/u next month Current glucose readings: fasting glucose <200, post prandial glucose: N/A Denies hypoglycemic/hyperglycemic symptoms Discussed meal planning options and Plate method for healthy eating Avoid sugary drinks and desserts Incorporate balanced protein, non starchy veggies, 1 serving of carbohydrate with each meal Increase water intake Increase physical activity as able Current exercise: UNABLE DUE TO CURRENT STATE--BLOOD PRESSURE LABILE; ENERGY LEVEL DECREASED Educated on medications-purpose & side effects Assessed patient finances. [facility]  They will no longer be servicing patient after this year and with certain medications. PLAN:  [facility]  Patient has enough Ozempic 0.225mto last until we can reapply in November 2022.  We will be ordering patient's Breztri via [employer] assistance program--call placed to az[employer]me to add breztri inhaler to patient's medications, [facility] for az&me patient assistance)--RE-ENROLLED FOR 2023--MEDS TO SHIP TO PATIENT'S HOME.  Hypertension: Uncontrolled- BP goal <130/80; current treatment: bisoprolol, clonidine, hydralazine, isosorbide Current home readings:  traditional automatic cuff (reports labile from 100s-170s SBP) wrist cuff: 140/82, 123/71; PCP in office today 123/71 Reports hypotensive/hypertensive symptoms Counseled on taking all BP medications as prescribed and at the same time of day to keep steady control; BP is managed by cardiology, therefore patient instructed to call cards for any BP needs; patient to keep strict log of BP times and readings  Patient Goals/Self-Care Activities Over the next 90 days, patient will:  - take medications as prescribed check glucose DAILY (FASTING) OR IF SYMPTOMATIC , document, and provide at future appointments check blood pressure DAILY OR IF SYMPTOMATIC, document, and provide at future appointments  Follow Up Plan: Telephone follow up appointment with care management team member scheduled for: 1 month   Plan: Telephone follow up appointment with care management team member scheduled for:  4 months  Signature JuRegina EckPharmD, BCPS Clinical Pharmacist, [facility] Phone [phone] Please call the care guide team at [phone] you need to cancel or reschedule your appointment.   Patient verbalizes understanding of instructions and care plan provided today and agrees to view in [facility] MyChart status confirmed with patient.

## 2021-03-27 NOTE — Progress Notes (Signed)
Chronic Care Management Pharmacy Note  03/27/2021 Name:  ALBIE ARIZPE MRN:  [id number] DOB:  [birth year]  Summary: t2dm  Recommendations/Changes made from today's visit: Diabetes: Uncontrolled-A1C 7.9%; current treatment: FARXIGA 10MG DAILY, glimepiride Continue Farxiga to 23m DAILY--patient stable Escribed to [facility] Received notification from AZ&ME regarding approval for [employer] Patient assistance approved UNTIL 02/07/21.  AUTO RE-ENROLLMENT FOR 2023 WILL BEGIN AFTER 02/07/21 & LAST UNTIL 02/07/22. Discussed with patient about resuming ozempic low dose (if able to get) and d/c glimepiride; will discuss at PCP f/u next month Current glucose readings: fasting glucose <200, post prandial glucose: N/A Denies hypoglycemic/hyperglycemic symptoms Discussed meal planning options and Plate method for healthy eating Avoid sugary drinks and desserts Incorporate balanced protein, non starchy veggies, 1 serving of carbohydrate with each meal Increase water intake Increase physical activity as able Current exercise: UNABLE DUE TO CURRENT STATE--BLOOD PRESSURE LABILE; ENERGY LEVEL DECREASED Educated on medications-purpose & side effects Assessed patient finances.  We will be ordering patient's Breztri via ATime Warnerpatient assistance program--call placed to az&me to add breztri inhaler to patient's medications, [facility] for [facility]&me patient assistance)--RE-ENROLLED FOR 2023--MEDS TO SHIP TO PATIENT'S HOME.  Subjective: RGRIFFEY NICASIOis an 76y.o. year old male who is a primary patient of [name], JFransisca Kaufmann MD.  The CCM team was consulted for assistance with disease management and care coordination needs.    Engaged with patient by telephone for follow up visit in response to provider referral for pharmacy case management and/or care coordination services.   Consent to Services:  The patient was given information about Chronic Care  Management services, agreed to services, and gave verbal consent prior to initiation of services.  Please see initial visit note for detailed documentation.   Patient Care Team: [name], JFransisca Kaufmann MD as PCP - General (Family Medicine) Croitoru, MDani Gobble MD as PCP - Cardiology (Cardiology) LHarlen Labs MD as Referring Physician (Optometry) FShea Evans MDerrek Guas Social Worker (Licensed Clinical Social Worker) HIlean China RN as Case Manager Croitoru, MDani Gobble MD as CFinancial risk analystPhysician (Cardiology) PLavera Guise RNorthern [facility](Pharmacist) BLiana Gerold MD as Consulting Physician (Nephrology) JMilus Banister MD as Attending Physician (Gastroenterology) ARigoberto Noel MD as Consulting Physician (Pulmonary Disease)  Objective:  Lab Results  Component Value Date   CREATININE 1.02 01/26/2021   CREATININE 1.12 10/24/2020   CREATININE 1.13 10/17/2020    Lab Results  Component Value Date   HGBA1C 7.4 (H) 01/26/2021   Last diabetic Eye exam:  Lab Results  Component Value Date/Time   HMDIABEYEEXA No Retinopathy 03/23/2018 12:00 AM    Last diabetic Foot exam: No results found for: HMDIABFOOTEX      Component Value Date/Time   CHOL 88 (L) 01/26/2021 1516   CHOL 111 06/12/2012 1232   TRIG 118 01/26/2021 1516   TRIG 141 01/24/2014 1143   TRIG 192 (H) 06/12/2012 1232   HDL 49 01/26/2021 1516   HDL 38 (L) 01/24/2014 1143   HDL 33 (L) 06/12/2012 1232   CHOLHDL 1.8 01/26/2021 1516   CHOLHDL 4.8 11/22/2006 1712   VLDL 39 11/22/2006 1712   LDLCALC 18 01/26/2021 1516   L[place]27 10/25/2013 1224   L[place]40 06/12/2012 1232    Hepatic Function Latest Ref Rng & Units 01/26/2021 10/17/2020 07/14/2020  Total Protein 6.0 - 8.5 g/dL 6.7 6.6 6.8  Albumin 3.7 - 4.7 g/dL 4.2 3.8 4.6  AST 0 - 40 IU/L  _0 ALT 0 - 44 IU/L 18 29 33  Alk Phosphatase 44 - 121 IU/L 105 71 98  Total Bilirubin 0.0 - 1.2 mg/dL 0.4 0.6 0.5  Bilirubin, Direct 0.00 - 0.40 mg/dL - - -    Lab  Results  Component Value Date/Time   TSH 3.30 02/25/2017 10:28 AM   TSH 2.300 08/21/2015 01:52 PM    CBC Latest Ref Rng & Units 01/26/2021 10/17/2020 07/14/2020  WBC 3.4 - 10.8 x10E3/uL 9.3 7.5 16.7(H)  Hemoglobin 13.0 - 17.7 g/dL 13.9 14.0 16.3  Hematocrit 37.5 - 51.0 % 43.7 43.5 48.5  Platelets 150 - 450 x10E3/uL 242 211 250    No results found for: VD25OH  Clinical ASCVD: No  The ASCVD Risk score (Arnett DK, et al., 2019) failed to calculate for the following reasons:   The valid total cholesterol range is 130 to 320 mg/dL    Other: (CHADS2VASc if Afib, PHQ9 if depression, MMRC or CAT for COPD, ACT, DEXA)  Social History   Tobacco Use  Smoking Status Former   Packs/day: 1.00   Types: Cigarettes   Start date: 02/09/1964   Quit date: 02/08/1969   Years since quitting: 52.1  Smokeless Tobacco Former   Quit date: 1971   BP Readings from Last 3 Encounters:  03/18/21 140/88  03/13/21 (!) 159/77  03/09/21 118/66   Pulse Readings from Last 3 Encounters:  03/18/21 62  03/13/21 71  03/09/21 67   Wt Readings from Last 3 Encounters:  03/18/21 257 lb 1.3 oz (116.6 kg)  03/13/21 260 lb (117.9 kg)  03/09/21 255 lb 9.6 oz (115.9 kg)    Assessment: Review of patient past medical history, allergies, medications, health status, including review of consultants reports, laboratory and other test data, was performed as part of comprehensive evaluation and provision of chronic care management services.   SDOH:  (Social Determinants of Health) assessments and interventions performed:    CCM Care Plan  Allergies  Allergen Reactions   Amlodipine Besy-Benazepril Hcl Swelling and Other (See Comments)    Makes tongue swell (lotrel)   Hydrocodone Itching    Can tolerate in low doses   Oxycodone Itching   Phenergan [Promethazine Hcl] Other (See Comments)    "I can't remember."    Medications Reviewed Today     Reviewed by [name], [facility] (Pharmacist) on 04/02/21 at 1058  Med  List Status: <None>   Medication Order Taking? Sig Documenting Provider Last Dose Status Informant  albuterol (PROVENTIL) (2.5 MG/3ML) 0.083% nebulizer solution [id number] No USE 1 VIAL IN NEBULIZER EVERY 6 HOURS AS NEEDED FOR WHEEZING AND FOR SHORTNESS OF BREATH [name], [name], MD Taking Active   albuterol (VENTOLIN HFA) 108 (90 Base) MCG/ACT inhaler [id number] No Inhale 2 puffs into the lungs every 6 (six) hours as needed for wheezing or shortness of breath. Claretta Fraise, MD Taking Active Multiple Informants  Discontinued 04/02/21 1058 (Completed Course)   atorvastatin (LIPITOR) 40 MG tablet [id number] No Take 1 tablet by mouth once daily Chevis Pretty, FNP Taking Active   bisoprolol (ZEBETA) 5 MG tablet [id number] No Take 1/2 (one-half) tablet by mouth once daily [name], [name], MD Taking Active   Budeson-Glycopyrrol-Formoterol (BREZTRI AEROSPHERE) 160-9-4.8 MCG/ACT Hollie Salk [id number]  Inhale 2 puffs into the lungs in the morning and at bedtime. [name], [name], MD  Active            Med Note Blanca Friend, Royce Macadamia   Fri Mar 27, 2021 11:33 AM)  Via AZ&me patient assistance program   cetirizine (ZYRTEC) 10 MG tablet [id number] No Take 1 tablet (10 mg total) by mouth daily. Sharion Balloon, FNP Taking Active   chlorthalidone (HYGROTON) 25 MG tablet [id number] No Take by mouth. [provider] Taking Active Multiple Informants  Cholecalciferol (VITAMIN D3) 125 MCG (5000 UT) TABS [id number] No Take 5,000 Units by mouth daily. [provider] Taking Active Multiple Informants  colchicine 0.6 MG tablet [id number] No Take 1 tablet by mouth once daily [name], [name], MD Taking Active Multiple Informants  cycloSPORINE (RESTASIS) 0.05 % ophthalmic emulsion [id number] No Apply to eye. [provider] Taking Active Multiple Informants  dapagliflozin propanediol (FARXIGA) 10 MG TABS tablet [id number]  Take 1 tablet (10 mg total) by mouth daily. [name], [name], MD   Active            Med Note Blanca Friend, Royce Macadamia   Fri Mar 27, 2021 11:33 AM) Via AZ&me patient assistance program   fluticasone (FLONASE) 50 MCG/ACT nasal spray [id number] No Place 2 sprays into both nostrils daily. Sharion Balloon, FNP Taking Active   furosemide (LASIX) 40 MG tablet [id number] No Take 40 mg by mouth daily as needed (fluid retention.). [provider] Taking Active Multiple Informants  glimepiride (AMARYL) 4 MG tablet [id number] No Take by mouth. [provider] Taking Active Multiple Informants  hydrALAZINE (APRESOLINE) 25 MG tablet [id number] No TAKE 3 TABLETS BY MOUTH THREE TIMES DAILY Croitoru, Mihai, MD Taking Active   hydrALAZINE (APRESOLINE) 50 MG tablet [id number] No TAKE 1 TABLET BY MOUTH THREE TIMES DAILY Croitoru, Mihai, MD Taking Active   isosorbide dinitrate (ISORDIL) 20 MG tablet [id number] No Take 1 tablet (20 mg total) by mouth 3 (three) times daily. [name], [name], MD Taking Active Multiple Informants  mirabegron ER (MYRBETRIQ) 25 MG TB24 tablet [id number] Yes Take 1 tablet (25 mg total) by mouth daily. [name], [name], MD  Active   nitroGLYCERIN (NITROSTAT) 0.4 MG SL tablet [id number] No Place 1 tablet (0.4 mg total) under the tongue every 5 (five) minutes x 3 doses as needed for chest pain. Manuella Ghazi, Pratik D, DO Taking Expired 07/07/20 2359            Med Note (DAVIS, SOPHIA A   Tue Jul 15, 2020  8:58 AM) On hand  nitroGLYCERIN (NITROSTAT) 0.4 MG SL tablet [id number] No Place under the tongue. [provider] Taking Active Multiple Informants  olmesartan (BENICAR) 20 MG tablet [id number] No Take 1 tablet (20 mg total) by mouth daily. Tanda Rockers, MD Taking Active   omeprazole (PRILOSEC) 40 MG capsule [id number] No TAKE 1 CAPSULE BY MOUTH ONCE DAILY 30 TO 60 MINUTES PRIOR TO BREAKFAST  Patient taking differently: Take 40 mg by mouth daily.   Milus Banister, MD Taking Active   [facility] ULTRA test strip [id number] No USE 1 STRIP TO CHECK  GLUCOSE ONCE DAILY [name], [name], MD Taking Active Multiple Informants  predniSONE (DELTASONE) 10 MG tablet [id number]  Take 4 tablets (40 mg total) by mouth daily with breakfast for 4 days, THEN 3 tablets (30 mg total) daily with breakfast for 4 days, THEN 2 tablets (20 mg total) daily with breakfast for 4 days, THEN 1 tablet (10 mg total) daily with breakfast for 4 days. Rigoberto Noel, MD  Active   Discontinued 04/02/21 1057 (Completed Course)   RESTASIS 0.05 % ophthalmic emulsion [id number] No Place 1 drop into both eyes in the morning and at bedtime.  [provider] Taking Active Multiple Informants  roflumilast (DALIRESP) 500 MCG TABS tablet [id number]  Take 1 tablet (500 mcg total) by mouth daily. [name], [name], MD  Active            Med Note Blanca Friend, Royce Macadamia   Fri Mar 27, 2021 11:33 AM) Via AZ&me patient assistance program   sertraline (ZOLOFT) 100 MG tablet [id number] No Take 1 tablet (100 mg total) by mouth daily. [name], [name], MD Taking Active Multiple Informants  spironolactone (ALDACTONE) 25 MG tablet [id number] No Take by mouth. [provider] Taking Active   tamsulosin (FLOMAX) 0.4 MG CAPS capsule [id number] No TAKE 1 CAPSULE BY MOUTH ONCE DAILY AFTER SUPPER Chevis Pretty, FNP Taking Active             Patient Active Problem List   Diagnosis Date Noted   Chronic respiratory failure with hypoxia ([place]) 10/20/2020   Insomnia    CHF (congestive heart failure) ([place]) 01/06/2018   Depression with anxiety 01/04/2018   CKD (chronic kidney disease), stage III ([place]) 01/04/2018   COPD GOLD 0/  AB 01/04/2018   Swelling of lower extremity 11/30/2016   Morbid obesity ([place]) 08/21/2015   Coronary artery disease involving native coronary artery of native heart without angina pectoris 12/05/2014   Urinary urgency 10/15/2014   Spinal stenosis, lumbar region, with neurogenic claudication 01/02/2014   Hyperlipidemia associated with type 2 diabetes  mellitus ([place]) 10/25/2013   Hyperlipidemia, unspecified 10/25/2013   COLONIC POLYPS, ADENOMATOUS 02/25/2007   Diabetes mellitus type II, non insulin dependent ([place]) 02/25/2007   Gout 02/25/2007   Essential hypertension 02/25/2007   Cough variant asthma 02/25/2007   OSA on CPAP 02/25/2007   FATIGUE, CHRONIC 02/25/2007   HEADACHE, CHRONIC 02/25/2007   GERD (gastroesophageal reflux disease) 02/25/2007    Immunization History  Administered Date(s) Administered   Fluad Quad(high Dose 65+) 11/15/2018, 01/26/2021   Influenza Whole 10/09/2008   Influenza, High Dose Seasonal PF 11/29/2016, 11/08/2017   Influenza,inj,Quad PF,6+ Mos 12/04/2013, 12/04/2014, 12/23/2015   Pneumococcal Conjugate-13 12/04/2013   Pneumococcal Polysaccharide-23 08/26/2011   Td 11/08/2017    Conditions to be addressed/monitored: HLD and DMII  There are no care plans that you recently modified to display for this patient.   Medication Assistance:  farxiga obtained through az&me medication assistance program.  Enrollment ends 02/07/22  Patient's preferred pharmacy is:  [facility] [address] [place] [address] Phone: [phone] Fax: [phone]  [facility] ([place] [place], [place] [address] Phone: [phone] Fax: [place], [place]. [place] [address] Phone: [phone] Fax: [phone]  Follow Up:  Patient agrees to Care Plan and Follow-up.  Plan: Telephone follow up appointment with care management team member scheduled for:  04/2021  Regina Eck, PharmD, BCPS Clinical Pharmacist, [place]  II Phone [phone]

## 2021-03-30 DIAGNOSIS — R0902 Hypoxemia: Secondary | ICD-10-CM | POA: Diagnosis not present

## 2021-03-30 DIAGNOSIS — I5032 Chronic diastolic (congestive) heart failure: Secondary | ICD-10-CM | POA: Diagnosis not present

## 2021-03-30 DIAGNOSIS — G4733 Obstructive sleep apnea (adult) (pediatric): Secondary | ICD-10-CM | POA: Diagnosis not present

## 2021-04-01 DIAGNOSIS — J449 Chronic obstructive pulmonary disease, unspecified: Secondary | ICD-10-CM | POA: Diagnosis not present

## 2021-04-07 DIAGNOSIS — F339 Major depressive disorder, recurrent, unspecified: Secondary | ICD-10-CM

## 2021-04-07 DIAGNOSIS — I2510 Atherosclerotic heart disease of native coronary artery without angina pectoris: Secondary | ICD-10-CM

## 2021-04-07 DIAGNOSIS — J449 Chronic obstructive pulmonary disease, unspecified: Secondary | ICD-10-CM

## 2021-04-07 DIAGNOSIS — J441 Chronic obstructive pulmonary disease with (acute) exacerbation: Secondary | ICD-10-CM

## 2021-04-07 DIAGNOSIS — I10 Essential (primary) hypertension: Secondary | ICD-10-CM

## 2021-04-07 DIAGNOSIS — R0902 Hypoxemia: Secondary | ICD-10-CM | POA: Diagnosis not present

## 2021-04-07 DIAGNOSIS — J410 Simple chronic bronchitis: Secondary | ICD-10-CM

## 2021-04-07 DIAGNOSIS — I5042 Chronic combined systolic (congestive) and diastolic (congestive) heart failure: Secondary | ICD-10-CM

## 2021-04-07 DIAGNOSIS — F418 Other specified anxiety disorders: Secondary | ICD-10-CM

## 2021-04-07 DIAGNOSIS — E119 Type 2 diabetes mellitus without complications: Secondary | ICD-10-CM

## 2021-04-07 DIAGNOSIS — I5032 Chronic diastolic (congestive) heart failure: Secondary | ICD-10-CM | POA: Diagnosis not present

## 2021-04-07 DIAGNOSIS — G4733 Obstructive sleep apnea (adult) (pediatric): Secondary | ICD-10-CM | POA: Diagnosis not present

## 2021-04-11 ENCOUNTER — Other Ambulatory Visit: Payer: Self-pay | Admitting: Cardiovascular Disease

## 2021-04-17 ENCOUNTER — Ambulatory Visit (INDEPENDENT_AMBULATORY_CARE_PROVIDER_SITE_OTHER): Payer: PPO | Admitting: Cardiovascular Disease

## 2021-04-17 ENCOUNTER — Other Ambulatory Visit: Payer: Self-pay

## 2021-04-17 ENCOUNTER — Telehealth: Payer: Self-pay | Admitting: *Deleted

## 2021-04-17 ENCOUNTER — Encounter: Payer: Self-pay | Admitting: Cardiovascular Disease

## 2021-04-17 VITALS — Ht 67.0 in | Wt 255.0 lb

## 2021-04-17 DIAGNOSIS — Z9989 Dependence on other enabling machines and devices: Secondary | ICD-10-CM

## 2021-04-17 DIAGNOSIS — I2510 Atherosclerotic heart disease of native coronary artery without angina pectoris: Secondary | ICD-10-CM | POA: Diagnosis not present

## 2021-04-17 DIAGNOSIS — E669 Obesity, unspecified: Secondary | ICD-10-CM | POA: Diagnosis not present

## 2021-04-17 DIAGNOSIS — E1169 Type 2 diabetes mellitus with other specified complication: Secondary | ICD-10-CM

## 2021-04-17 DIAGNOSIS — E785 Hyperlipidemia, unspecified: Secondary | ICD-10-CM

## 2021-04-17 DIAGNOSIS — J449 Chronic obstructive pulmonary disease, unspecified: Secondary | ICD-10-CM | POA: Diagnosis not present

## 2021-04-17 DIAGNOSIS — G4733 Obstructive sleep apnea (adult) (pediatric): Secondary | ICD-10-CM

## 2021-04-17 DIAGNOSIS — I5032 Chronic diastolic (congestive) heart failure: Secondary | ICD-10-CM

## 2021-04-17 DIAGNOSIS — I10 Essential (primary) hypertension: Secondary | ICD-10-CM

## 2021-04-17 DIAGNOSIS — N182 Chronic kidney disease, stage 2 (mild): Secondary | ICD-10-CM | POA: Diagnosis not present

## 2021-04-17 DIAGNOSIS — I5042 Chronic combined systolic (congestive) and diastolic (congestive) heart failure: Secondary | ICD-10-CM

## 2021-04-17 NOTE — Telephone Encounter (Signed)
Have him use Flonase and Dramamine for motion sickness medicine and see if that helps, if not then see [place] early next week for an appointment ?

## 2021-04-17 NOTE — Telephone Encounter (Signed)
Daughter made aware. Wanted to go ahead and schedule an appt with Dettiger next week. Scheduled 3/15 at 3:10. Cancelled appt with MMM on 3/13 because pt would prefer to see [name]. ?

## 2021-04-17 NOTE — Patient Instructions (Signed)

## 2021-04-17 NOTE — Telephone Encounter (Signed)
Pt has been stumbling around for 2 wks now & c/o ear pain at the beginning ?Saw cardiologist this morning, nurse noticed this too. His EKG was done as well as orthostatic BP's ?Was recommended that he be seen to be checked out for possible vertigo ?He is taking his Zyrtec daily, any other recommendations for over the weekend ?Appt has been made for Monday  ?

## 2021-04-17 NOTE — Progress Notes (Signed)
Cardiology Office Note    Date:  04/18/2021   ID:  Kenneth Fuller, DOB [birth year], MRN [id number]  PCP:  [name], [name], MD  Cardiologist:   Sanda Klein, MD   Chief Complaint  Patient presents with   Shortness of Breath   Congestive Heart Failure     History of Present Illness:  Kenneth Fuller is a 76 y.o. male with chronic diastolic heart failure, morbid obesity, obstructive sleep apnea, hyperlipidemia, diabetes mellitus complicated by neuropathy, symptomatic orthostatic hypotension, minor nonobstructive coronary atherosclerosis, moderately severe restrictive lung disease.  He has a history of angioedema with ACE inhibitors.  He is done pretty well from a cardiac point of view since his last appointment in September.  He has not had any additional visits to the emergency room or hospitalizations.He continues to have NYHA functional class II-3 shortness of breath on exertion.  Sometimes he gets short of breath simply taking a shower.  He has not had any chest pain.  He denies dizziness or syncope.  He has not had lower extremity edema, orthopnea or PND.  He has a prescription for furosemide to be taken "as needed" but has not taking it "in a a while".  Last saw Dr. Elsworth Soho in the pulmonary clinic in February and received a prednisone taper as well as antibiotics.  Feels that he has improved since then.  His weight has been steadily declining over the last several months (may be due to treatment with [place]) and he is no longer morbidly obese (BMI is 39.9 per our office scale today).  We have previously estimated his "dry weight" around 270 pounds but obviously this is no longer accurate.  Past episodes of heart failure exacerbation also manifested with chest tightness that resolved after diuresis.  With more liberal management of his hypertension and avoiding excessive diuretics, he has had no further complaints of orthostatic dizziness or syncope.  Today in the office he  did have an orthostatic drop of roughly 30 mmHg and SBP from sitting to standing, but did not develop dizziness since his starting systolic blood pressure was around 160.  Baseline BNP appears to be around 170. He had a fairly recent coronary angiogram in October 2018 that showed a maximum stenosis of 10% in the mid LAD and mid RCA.  His echocardiogram showed evidence of normal left ventricular systolic function (EF 34-03%) and diastolic dysfunction with indeterminate filling pressures (E/e'=12-13).  He was hospitalized for severe shortness of breath from November 27 through January 09, 2018.   On admission his BNP was slightly elevated at 330 (previous baseline 72.6 in 2017).  An echo during that hospitalization which was a mediocre quality study but showed an ejection fraction decreased at 45-50%.  During his hospitalization fo CHF in August 2020 the BNP was elevated to 176. He diuresed 10 liters.  He underwent cardiac catheterization in October 2018 he has preserved left ventricular systolic function and minimal coronary artery disease.  LVEDP was borderline at the time of catheterization (10-14 mmHg).  His echo did show grade 2 diastolic dysfunction.  However pulmonary function test had shown fairly severe obstructive lung disease with FEV1 in the 40-45% of predicted range.  He did not have much improvement with bronchodilators either on the PFTs or clinically.  He does have a history of roughly 5 pack years of smoking, but quit 40 years ago.  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  Chest CT did  not show evidence of severe interstitial lung disease.    Past Medical History:  Diagnosis Date   Allergy    Asthma    CAD (coronary artery disease)    CHF (congestive heart failure) (HCC)    COPD (chronic obstructive pulmonary disease) (HCC)    Diabetes mellitus    Fibromyalgia    GERD (gastroesophageal reflux disease)    GI bleeding    Gout    Hyperlipidemia     Hypertension    Hypogonadism male    Insomnia    MRSA cellulitis    Neuropathy    Obesity    Shortness of breath dyspnea    with exertion    Sleep apnea    cpap- 14    Wheezing    no asthma diagnosis    Past Surgical History:  Procedure Laterality Date   BACK SURGERY     BIOPSY  12/20/2019   Procedure: BIOPSY;  Surgeon: Milus Banister, MD;  Location: [facility];  Service: Endoscopy;;   CARDIAC CATHETERIZATION     COLONOSCOPY     COLONOSCOPY WITH PROPOFOL N/A 12/20/2019   Procedure: COLONOSCOPY WITH PROPOFOL;  Surgeon: Milus Banister, MD;  Location: [facility];  Service: Endoscopy;  Laterality: N/A;   ESOPHAGOGASTRODUODENOSCOPY (EGD) WITH PROPOFOL N/A 12/20/2019   Procedure: ESOPHAGOGASTRODUODENOSCOPY (EGD) WITH PROPOFOL;  Surgeon: Milus Banister, MD;  Location: [facility];  Service: Endoscopy;  Laterality: N/A;   LEFT HEART CATH AND CORONARY ANGIOGRAPHY N/A 12/02/2016   Procedure: LEFT HEART CATH AND CORONARY ANGIOGRAPHY;  Surgeon: Jettie Booze, MD;  Location: [facility];  Service: Cardiovascular;  Laterality: N/A;   LUMBAR LAMINECTOMY/DECOMPRESSION MICRODISCECTOMY N/A 01/02/2014   Procedure: CENTRAL DECOMPRESSION LUMBAR LAMINECTOMY L3-L4, L4-L5;  Surgeon: Tobi Bastos, MD;  Location: [facility];  Service: Orthopedics;  Laterality: N/A;   neck fusion     POLYPECTOMY  12/20/2019   Procedure: POLYPECTOMY;  Surgeon: Milus Banister, MD;  Location: [facility];  Service: Endoscopy;;    Current Medications: Outpatient Medications Prior to Visit  Medication Sig Dispense Refill   albuterol (PROVENTIL) (2.5 MG/3ML) 0.083% nebulizer solution USE 1 VIAL IN NEBULIZER EVERY 6 HOURS AS NEEDED FOR WHEEZING AND FOR SHORTNESS OF BREATH 180 mL 0   albuterol (VENTOLIN HFA) 108 (90 Base) MCG/ACT inhaler Inhale 2 puffs into the lungs every 6 (six) hours as needed for wheezing or shortness of breath. 1 each 2   bisoprolol (ZEBETA) 5 MG tablet Take 1/2 (one-half)  tablet by mouth once daily 45 tablet 0   Budeson-Glycopyrrol-Formoterol (BREZTRI AEROSPHERE) 160-9-4.8 MCG/ACT AERO Inhale 2 puffs into the lungs in the morning and at bedtime. 32.1 g 4   cetirizine (ZYRTEC) 10 MG tablet Take 1 tablet (10 mg total) by mouth daily. 30 tablet 11   Cholecalciferol (VITAMIN D3) 125 MCG (5000 UT) TABS Take 5,000 Units by mouth daily.     cycloSPORINE (RESTASIS) 0.05 % ophthalmic emulsion Apply to eye.     dapagliflozin propanediol (FARXIGA) 10 MG TABS tablet Take 1 tablet (10 mg total) by mouth daily. 90 tablet 11   fluticasone (FLONASE) 50 MCG/ACT nasal spray Place 2 sprays into both nostrils daily. 16 g 6   GEMTESA 75 MG TABS Take 1 tablet by mouth daily.     hydrALAZINE (APRESOLINE) 25 MG tablet TAKE 3 TABLETS BY MOUTH THREE TIMES DAILY 270 tablet 3   hydrALAZINE (APRESOLINE) 50 MG tablet TAKE 1 TABLET BY MOUTH THREE TIMES DAILY 270 tablet 0  isosorbide dinitrate (ISORDIL) 20 MG tablet Take 1 tablet (20 mg total) by mouth 3 (three) times daily. 270 tablet 3   omeprazole (PRILOSEC) 40 MG capsule TAKE 1 CAPSULE BY MOUTH ONCE DAILY 30 TO 60 MINUTES PRIOR TO BREAKFAST (Patient taking differently: Take 40 mg by mouth daily.) 30 capsule 11   ONETOUCH ULTRA test strip USE 1 STRIP TO CHECK GLUCOSE ONCE DAILY 100 each 0   roflumilast (DALIRESP) 500 MCG TABS tablet Take 1 tablet (500 mcg total) by mouth daily. 90 tablet 5   sertraline (ZOLOFT) 100 MG tablet Take 1 tablet (100 mg total) by mouth daily. 90 tablet 3   spironolactone (ALDACTONE) 25 MG tablet Take by mouth.     tamsulosin (FLOMAX) 0.4 MG CAPS capsule TAKE 1 CAPSULE BY MOUTH ONCE DAILY AFTER SUPPER 90 capsule 0   atorvastatin (LIPITOR) 40 MG tablet Take 1 tablet by mouth once daily (Patient not taking: Reported on 04/17/2021) 90 tablet 0   chlorthalidone (HYGROTON) 25 MG tablet Take by mouth. (Patient not taking: Reported on 04/17/2021)     colchicine 0.6 MG tablet Take 1 tablet by mouth once daily (Patient not  taking: Reported on 04/17/2021) 20 tablet 0   furosemide (LASIX) 40 MG tablet Take 40 mg by mouth daily as needed (fluid retention.). (Patient not taking: Reported on 04/17/2021)     glimepiride (AMARYL) 4 MG tablet Take by mouth.     mirabegron ER (MYRBETRIQ) 25 MG TB24 tablet Take 1 tablet (25 mg total) by mouth daily. (Patient not taking: Reported on 04/17/2021) 30 tablet 3   nitroGLYCERIN (NITROSTAT) 0.4 MG SL tablet Place 1 tablet (0.4 mg total) under the tongue every 5 (five) minutes x 3 doses as needed for chest pain. (Patient not taking: Reported on 04/17/2021) 30 tablet 12   nitroGLYCERIN (NITROSTAT) 0.4 MG SL tablet Place under the tongue. (Patient not taking: Reported on 04/17/2021)     olmesartan (BENICAR) 20 MG tablet Take 1 tablet (20 mg total) by mouth daily. 30 tablet 11   RESTASIS 0.05 % ophthalmic emulsion Place 1 drop into both eyes in the morning and at bedtime.     No facility-administered medications prior to visit.     Allergies:   Amlodipine besy-benazepril hcl, Hydrocodone, Oxycodone, and Phenergan [promethazine hcl]   Social History   Socioeconomic History   Marital status: Married    Spouse name: Butch Penny   Number of children: 3   Years of education: 8   Highest education level: GED or equivalent  Occupational History   Occupation: retired/disability 1999    Employer: DISABLED    Comment: Textiles  Tobacco Use   Smoking status: Former    Packs/day: 1.00    Types: Cigarettes    Start date: 02/09/1964    Quit date: 02/08/1969    Years since quitting: 52.2   Smokeless tobacco: Former    Quit date: 1971  Scientific laboratory technician Use: Never used  Substance and Sexual Activity   Alcohol use: No    Alcohol/week: 0.0 standard drinks   Drug use: No   Sexual activity: Not Currently  Other Topics Concern   Not on file  Social History Narrative   Drinks caffeine tea occasionally   Lives with wife Butch Penny, children live nearby - [place] and Jeneen Rinks   One son, Heath Lark passed away  last year suddenly   Social Determinants of Health   Financial Resource Strain: Low Risk    Difficulty of Paying Living Expenses: Not very  hard  Food Insecurity: No Food Insecurity   Worried About Charity fundraiser in the Last Year: Never true   Ran Out of Food in the Last Year: Never true  Transportation Needs: No Transportation Needs   Lack of Transportation (Medical): No   Lack of Transportation (Non-Medical): No  Physical Activity: Inactive   Days of Exercise per Week: 0 days   Minutes of Exercise per Session: 0 min  Stress: Stress Concern Present   Feeling of Stress : Rather much  Social Connections: Socially Integrated   Frequency of Communication with Friends and Family: More than three times a week   Frequency of Social Gatherings with Friends and Family: More than three times a week   Attends Religious Services: More than 4 times per year   Active Member of [employer] or Organizations: Yes   Attends Music therapist: More than 4 times per year   Marital Status: Married     Family History:  The patient's family history includes Colon cancer in his mother; Deep vein thrombosis in his brother; Diabetes in his father; Drug abuse in his sister; Heart attack in his father; Heart attack (age of onset: 63) in his son; Heart disease in his brother, brother, and father; Heart failure in his sister; Kidney disease in his sister.   ROS:   Please see the history of present illness.    ROS all other symptoms are reviewed and are negative  PHYSICAL EXAM:   VS:  Ht _0  (1.702 m)    Wt 255 lb (115.7 kg)    SpO2 97%    BMI 39.94 kg/m    Standing BP 133/81 Sitting BP 163/78   General: Alert, oriented x3, no distress, severely obese Head: no evidence of trauma, PERRL, EOMI, no exophtalmos or lid lag, no myxedema, no xanthelasma; normal ears, nose and oropharynx Neck: normal jugular venous pulsations and no hepatojugular reflux; brisk carotid pulses without delay and no  carotid bruits Chest: clear to auscultation, no signs of consolidation by percussion or palpation, normal fremitus, symmetrical and full respiratory excursions Cardiovascular: normal position and quality of the apical impulse, regular rhythm, normal first and second heart sounds, no murmurs, rubs or gallops Abdomen: no tenderness or distention, no masses by palpation, no abnormal pulsatility or arterial bruits, normal bowel sounds, no hepatosplenomegaly Extremities: no clubbing, cyanosis or edema; 2+ radial, ulnar and brachial pulses bilaterally; 2+ right femoral, posterior tibial and dorsalis pedis pulses; 2+ left femoral, posterior tibial and dorsalis pedis pulses; no subclavian or femoral bruits Neurological: grossly nonfocal Psych: Normal mood and affect    Wt Readings from Last 3 Encounters:  04/17/21 255 lb (115.7 kg)  03/18/21 257 lb 1.3 oz (116.6 kg)  03/13/21 260 lb (117.9 kg)      Studies/Labs Reviewed:  Echocardiogram 06/07/2020   1. Left ventricular ejection fraction, by estimation, is 55 to 60%. The  left ventricle has normal function. The left ventricle has no regional  wall motion abnormalities. There is mild left ventricular hypertrophy.  Left ventricular diastolic parameters  are consistent with Grade I diastolic dysfunction (impaired relaxation).   2. Right ventricular systolic function is normal. The right ventricular  size is normal.   3. Left atrial size was moderately dilated.   4. The mitral valve is grossly normal. Trivial mitral valve  regurgitation.   5. The aortic valve is tricuspid. Aortic valve regurgitation is not  visualized.   6. The inferior vena cava is normal in size  with greater than 50%  respiratory variability, suggesting right atrial pressure of 3 mmHg.   Comparison(s): Changes from prior study are noted. 09/24/2018: LVEF 50-55%.  EKG:  EKG is ordered today and personally reviewed.  Shows normal sinus rhythm and is a normal tracing.  QTc 444  ms.  Recent Labs: 05/07/2020: Pro B Natriuretic peptide (BNP) 219.0 07/10/2020: Magnesium 2.4 10/24/2020: BNP 66.1 01/26/2021: ALT 18; BUN 16; Creatinine, Ser 1.02; Hemoglobin 13.9; Platelets 242; Potassium 4.2; Sodium 138   Lipid Panel    Component Value Date/Time   CHOL 88 (L) 01/26/2021 1516   CHOL 111 06/12/2012 1232   TRIG 118 01/26/2021 1516   TRIG 141 01/24/2014 1143   TRIG 192 (H) 06/12/2012 1232   HDL 49 01/26/2021 1516   HDL 38 (L) 01/24/2014 1143   HDL 33 (L) 06/12/2012 1232   CHOLHDL 1.8 01/26/2021 1516   CHOLHDL 4.8 11/22/2006 1712   VLDL 39 11/22/2006 1712   LDLCALC 18 01/26/2021 1516   LDLCALC 27 10/25/2013 1224   LDLCALC 40 06/12/2012 1232    10/04/2019 hemoglobin A1c 5.8% 07/09/2020 hemoglobin A1c 7.9% 01/26/2021 hemoglobin A1c 7.4%  ASSESSMENT:    1. Chronic diastolic congestive heart failure ([place])   2. Chronic obstructive pulmonary disease, unspecified COPD type ([place])   3. OSA on CPAP   4. Essential hypertension   5. Coronary artery disease involving native coronary artery of native heart without angina pectoris   6. Hyperlipidemia associated with type 2 diabetes mellitus ([place])   7. Diabetes mellitus type 2 in obese ([place])   8. CKD (chronic kidney disease) stage 2, GFR 60-89 ml/min   9. Severe obesity (BMI 35.0-39.9) with comorbidity ([place])      PLAN:  In order of problems listed above:  CHF: Normal left ventricular systolic function on most recent echocardiogram from less than a year ago in April 2022.  During his most recent evaluation for shortness of breath in September 2022 his BNP was very low at 66 (during episodes of heart failure exacerbation in the past values as high as 330 have been recorded).  Seems to have had benefit from treatment with SGLT2 inhibitor.  Although obesity makes for difficult exam, he appears to be clinically euvolemic.  He is steadily losing weight and we will have to constantly reassess our "dry weight".  At this point this  appears to be around 250 pounds on his home scale.   COPD: Suspect this remains the most serious component of his functional limitation.  He had markedly abnormal PFTs in the past, FEV1 40-45% of normal.  Prefer to use bisoprolol since it is the most selective beta-blocker. OSA: Compliant with CPAP and denies daytime hypersomnolence. HTN: Allowing slightly higher systolic blood pressure to avoid symptomatic orthostatic hypotension.  Tolerate systolic blood pressure in the 140-160 range. CAD: Asymptomatic.  His episodes of chest pain in the past have resolved with diuresis.  He does not currently have any chest pain.  He had minimal CAD at catheterization.  Repeat coronary angiography in October 2018 showed only minor nonobstructive atherosclerosis, no progression from 2011 HLP: Excellent LDL cholesterol level. DM: A1c is still little above target range at 7.4%.  Intent to keep an SGLT2 inhibitor as part of his regimen reports benefit for heart failure.  Reminded him about the potential for increased urinary and genital infections.  Symptoms in this area should be promptly reported. CKD: Most recent creatinine was excellent at 1.02. Severe obesity has moved out of morbidly obese range  and is to be congratulated.     Medication Adjustments/Labs and Tests Ordered: Current medicines are reviewed at length with the patient today.  Concerns regarding medicines are outlined above.  Medication changes, Labs and Tests ordered today are listed in the Patient Instructions below. Patient Instructions  Medication Instructions:  No changes *If you need a refill on your cardiac medications before your next appointment, please call your pharmacy*   Lab Work: None ordered If you have labs (blood work) drawn today and your tests are completely normal, you will receive your results only by: [place] (if you have MyChart) OR A paper copy in the mail If you have any lab test that is abnormal or we need  to change your treatment, we will call you to review the results.   Testing/Procedures: None ordered   Follow-Up: At [facility], you and your health needs are our priority.  As part of our continuing mission to provide you with exceptional heart care, we have created designated Provider Care Teams.  These Care Teams include your primary Cardiologist (physician) and Advanced Practice Providers (APPs -  Physician Assistants and Nurse Practitioners) who all work together to provide you with the care you need, when you need it.  We recommend signing up for the patient portal called "MyChart".  Sign up information is provided on this After Visit Summary.  MyChart is used to connect with patients for Virtual Visits (Telemedicine).  Patients are able to view lab/test results, encounter notes, upcoming appointments, etc.  Non-urgent messages can be sent to your provider as well.   To learn more about what you can do with MyChart, go to [email or web address].    Your next appointment:   12 month(s)  The format for your next appointment:   In Person  Provider:   Sanda Klein, MD {     Signed, Sanda Klein, MD  04/18/2021 5:36 PM    [address], [place], [place]  [address] Phone: [phone]; Fax: [phone]

## 2021-04-18 ENCOUNTER — Encounter: Payer: Self-pay | Admitting: Cardiovascular Disease

## 2021-04-20 ENCOUNTER — Ambulatory Visit: Payer: PPO | Admitting: Nurse Practitioner

## 2021-04-22 ENCOUNTER — Ambulatory Visit (INDEPENDENT_AMBULATORY_CARE_PROVIDER_SITE_OTHER): Payer: PPO | Admitting: Family Medicine

## 2021-04-22 ENCOUNTER — Encounter: Payer: Self-pay | Admitting: Family Medicine

## 2021-04-22 VITALS — BP 136/76 | HR 73 | Ht 67.0 in | Wt 252.0 lb

## 2021-04-22 DIAGNOSIS — J0110 Acute frontal sinusitis, unspecified: Secondary | ICD-10-CM | POA: Diagnosis not present

## 2021-04-22 MED ORDER — MECLIZINE HCL 25 MG PO TABS: 25.0000 mg | ORAL_TABLET | Freq: Three times a day (TID) | ORAL | 1 refills | Status: DC | PRN

## 2021-04-22 MED ORDER — AMOXICILLIN 500 MG PO CAPS: 500.0000 mg | ORAL_CAPSULE | Freq: Two times a day (BID) | ORAL | 0 refills | Status: DC

## 2021-04-22 NOTE — Progress Notes (Signed)
? ?BP 136/76   Pulse 73   Ht _0  (1.702 m)   Wt 252 lb (114.3 kg)   SpO2 96%   BMI 39.47 kg/m?   ? ?Subjective:  ? ?Patient ID: Kenneth Fuller, male    DOB: [birth year], 76 y.o.   MRN: [id number] ? ?HPI: ?Kenneth Fuller is a 76 y.o. male presenting on 04/22/2021 for Dizziness ? ? ?HPI ?Dizziness and vertigo ?Patient is coming in with complaints of dizziness and vertigo feelings.  He feels like he gets lightheaded but does not feel faint but feels like when he bends forward especially he feels lightheaded or dizzy and almost feels like his head is disconnected.  He does admit to having sinus pressure and ear pressure especially on the left ear this been going on over the past 2 weeks with the dizziness.  He denies any fevers or chills or shortness of breath or wheezing. ? ?Relevant past medical, surgical, family and social history reviewed and updated as indicated. Interim medical history since our last visit reviewed. ?Allergies and medications reviewed and updated. ? ?Review of Systems  ?Constitutional:  Negative for chills and fever.  ?HENT:  Positive for congestion, postnasal drip, sinus pressure and sneezing. Negative for ear discharge, ear pain, rhinorrhea, sore throat and voice change.   ?Eyes:  Negative for pain, discharge, redness and visual disturbance.  ?Respiratory:  Negative for cough, shortness of breath and wheezing.   ?Cardiovascular:  Negative for chest pain and leg swelling.  ?Musculoskeletal:  Negative for gait problem.  ?Skin:  Negative for rash.  ?Neurological:  Positive for dizziness and light-headedness.  ?All other systems reviewed and are negative. ? ?Per HPI unless specifically indicated above ? ? ?Allergies as of 04/22/2021   ? ?   Reactions  ? Amlodipine Besy-benazepril Hcl Swelling, Other (See Comments)  ? Makes tongue swell (lotrel)  ? Hydrocodone Itching  ? Can tolerate in low doses  ? Oxycodone Itching  ? Phenergan [promethazine Hcl] Other (See Comments)  ? "I can't  remember."  ? ?  ? ?  ?Medication List  ?  ? ?  ? Accurate as of April 22, 2021  4:05 PM. If you have any questions, ask your nurse or doctor.  ?  ?  ? ?  ? ?albuterol 108 (90 Base) MCG/ACT inhaler ?Commonly known as: VENTOLIN HFA ?Inhale 2 puffs into the lungs every 6 (six) hours as needed for wheezing or shortness of breath. ?  ?albuterol (2.5 MG/3ML) 0.083% nebulizer solution ?Commonly known as: PROVENTIL ?USE 1 VIAL IN NEBULIZER EVERY 6 HOURS AS NEEDED FOR WHEEZING AND FOR SHORTNESS OF BREATH ?  ?amoxicillin 500 MG capsule ?Commonly known as: AMOXIL ?Take 1 capsule (500 mg total) by mouth 2 (two) times daily. ?Started by: Worthy Rancher, MD ?  ?bisoprolol 5 MG tablet ?Commonly known as: ZEBETA ?Take 1/2 (one-half) tablet by mouth once daily ?  ?Breztri Aerosphere 160-9-4.8 MCG/ACT Aero ?Generic drug: Budeson-Glycopyrrol-Formoterol ?Inhale 2 puffs into the lungs in the morning and at bedtime. ?  ?cetirizine 10 MG tablet ?Commonly known as: ZYRTEC ?Take 1 tablet (10 mg total) by mouth daily. ?  ?cycloSPORINE 0.05 % ophthalmic emulsion ?Commonly known as: RESTASIS ?Apply to eye. ?  ?dapagliflozin propanediol 10 MG Tabs tablet ?Commonly known as: [place] ?Take 1 tablet (10 mg total) by mouth daily. ?  ?fluticasone 50 MCG/ACT nasal spray ?Commonly known as: FLONASE ?Place 2 sprays into both nostrils daily. ?  ?furosemide 40 MG tablet ?Commonly known  as: LASIX ?Take 40 mg by mouth daily as needed (fluid retention.). ?  ?Gemtesa 75 MG Tabs ?Generic drug: Vibegron ?Take 1 tablet by mouth daily. ?  ?glimepiride 4 MG tablet ?Commonly known as: AMARYL ?Take by mouth. ?  ?hydrALAZINE 25 MG tablet ?Commonly known as: APRESOLINE ?TAKE 3 TABLETS BY MOUTH THREE TIMES DAILY ?  ?hydrALAZINE 50 MG tablet ?Commonly known as: APRESOLINE ?TAKE 1 TABLET BY MOUTH THREE TIMES DAILY ?  ?isosorbide dinitrate 20 MG tablet ?Commonly known as: ISORDIL ?Take 1 tablet (20 mg total) by mouth 3 (three) times daily. ?  ?meclizine 25 MG  tablet ?Commonly known as: ANTIVERT ?Take 1 tablet (25 mg total) by mouth 3 (three) times daily as needed for dizziness. ?Started by: Worthy Rancher, MD ?  ?mirabegron ER 25 MG Tb24 tablet ?Commonly known as: Myrbetriq ?Take 1 tablet (25 mg total) by mouth daily. ?  ?nitroGLYCERIN 0.4 MG SL tablet ?Commonly known as: NITROSTAT ?Place 1 tablet (0.4 mg total) under the tongue every 5 (five) minutes x 3 doses as needed for chest pain. ?  ?olmesartan 20 MG tablet ?Commonly known as: Benicar ?Take 1 tablet (20 mg total) by mouth daily. ?  ?omeprazole 40 MG capsule ?Commonly known as: PRILOSEC ?TAKE 1 CAPSULE BY MOUTH ONCE DAILY 30 TO 60 MINUTES PRIOR TO BREAKFAST ?What changed: See the new instructions. ?  ?OneTouch Ultra test strip ?Generic drug: glucose blood ?USE 1 STRIP TO CHECK GLUCOSE ONCE DAILY ?  ?roflumilast 500 MCG Tabs tablet ?Commonly known as: DALIRESP ?Take 1 tablet (500 mcg total) by mouth daily. ?  ?sertraline 100 MG tablet ?Commonly known as: ZOLOFT ?Take 1 tablet (100 mg total) by mouth daily. ?  ?spironolactone 25 MG tablet ?Commonly known as: ALDACTONE ?Take by mouth. ?  ?tamsulosin 0.4 MG Caps capsule ?Commonly known as: FLOMAX ?TAKE 1 CAPSULE BY MOUTH ONCE DAILY AFTER SUPPER ?  ?Vitamin D3 125 MCG (5000 UT) Tabs ?Take 5,000 Units by mouth daily. ?  ? ?  ? ? ? ?Objective:  ? ?BP 136/76   Pulse 73   Ht _0  (1.702 m)   Wt 252 lb (114.3 kg)   SpO2 96%   BMI 39.47 kg/m?   ?Wt Readings from Last 3 Encounters:  ?04/22/21 252 lb (114.3 kg)  ?04/17/21 255 lb (115.7 kg)  ?03/18/21 257 lb 1.3 oz (116.6 kg)  ?  ?Physical Exam ?Vitals and nursing note reviewed.  ?Constitutional:   ?   General: He is not in acute distress. ?   Appearance: He is well-developed. He is not diaphoretic.  ?HENT:  ?   Right Ear: Tympanic membrane, ear canal and external ear normal.  ?   Left Ear: Tympanic membrane, ear canal and external ear normal.  ?   Nose: Mucosal edema and congestion present. No rhinorrhea.  ?   Right  Sinus: Maxillary sinus tenderness present. No frontal sinus tenderness.  ?   Left Sinus: Maxillary sinus tenderness present. No frontal sinus tenderness.  ?   Mouth/Throat:  ?   Pharynx: Uvula midline. No oropharyngeal exudate or posterior oropharyngeal erythema.  ?   Tonsils: No tonsillar abscesses.  ?Eyes:  ?   General: No scleral icterus. ?   Conjunctiva/sclera: Conjunctivae normal.  ?Neck:  ?   Thyroid: No thyromegaly.  ?Cardiovascular:  ?   Rate and Rhythm: Normal rate and regular rhythm.  ?   Heart sounds: Normal heart sounds. No murmur heard. ?Pulmonary:  ?   Effort: Pulmonary effort is  normal. No respiratory distress.  ?   Breath sounds: Normal breath sounds. No wheezing, rhonchi or rales.  ?Musculoskeletal:     ?   General: Normal range of motion.  ?   Cervical back: Neck supple.  ?Lymphadenopathy:  ?   Cervical: No cervical adenopathy.  ?Skin: ?   General: Skin is warm and dry.  ?   Findings: No rash.  ?Neurological:  ?   Mental Status: He is alert and oriented to person, place, and time.  ?   Coordination: Coordination normal.  ?Psychiatric:     ?   Behavior: Behavior normal.  ? ? ? ? ?Assessment & Plan:  ? ?Problem List Items Addressed This Visit   ?None ?Visit Diagnoses   ? ? Acute non-recurrent frontal sinusitis    -  Primary  ? Relevant Medications  ? amoxicillin (AMOXIL) 500 MG capsule  ? meclizine (ANTIVERT) 25 MG tablet  ? ?  ?  ?Likely sinus infection is causing his dizziness and vertigo but will send meclizine as well.  Recommended that he take the amoxicillin completely and then talk to [place] if not improved. ?Follow up plan: ?Return if symptoms worsen or fail to improve. ? ?Counseling provided for all of the vaccine components ?No orders of the defined types were placed in this encounter. ? ? ?Caryl Pina, MD ?[place] ?04/22/2021, 4:05 PM ? ? ?  ?

## 2021-04-27 ENCOUNTER — Ambulatory Visit (INDEPENDENT_AMBULATORY_CARE_PROVIDER_SITE_OTHER): Payer: PPO | Admitting: Family Medicine

## 2021-04-27 ENCOUNTER — Encounter: Payer: Self-pay | Admitting: Family Medicine

## 2021-04-27 VITALS — BP 130/74 | HR 78 | Ht 67.0 in | Wt 252.0 lb

## 2021-04-27 DIAGNOSIS — I10 Essential (primary) hypertension: Secondary | ICD-10-CM

## 2021-04-27 DIAGNOSIS — K219 Gastro-esophageal reflux disease without esophagitis: Secondary | ICD-10-CM | POA: Diagnosis not present

## 2021-04-27 DIAGNOSIS — G4733 Obstructive sleep apnea (adult) (pediatric): Secondary | ICD-10-CM | POA: Diagnosis not present

## 2021-04-27 DIAGNOSIS — E119 Type 2 diabetes mellitus without complications: Secondary | ICD-10-CM

## 2021-04-27 DIAGNOSIS — I5032 Chronic diastolic (congestive) heart failure: Secondary | ICD-10-CM

## 2021-04-27 DIAGNOSIS — E785 Hyperlipidemia, unspecified: Secondary | ICD-10-CM

## 2021-04-27 DIAGNOSIS — R0902 Hypoxemia: Secondary | ICD-10-CM | POA: Diagnosis not present

## 2021-04-27 DIAGNOSIS — E1169 Type 2 diabetes mellitus with other specified complication: Secondary | ICD-10-CM | POA: Diagnosis not present

## 2021-04-27 DIAGNOSIS — N1832 Chronic kidney disease, stage 3b: Secondary | ICD-10-CM | POA: Diagnosis not present

## 2021-04-27 LAB — BAYER DCA HB A1C WAIVED: HB A1C (BAYER DCA - WAIVED): 7.3 % — ABNORMAL HIGH (ref 4.8–5.6)

## 2021-04-27 NOTE — Progress Notes (Signed)
? ?BP 130/74   Pulse 78   Ht _0  (1.702 m)   Wt 252 lb (114.3 kg)   SpO2 93%   BMI 39.47 kg/m?   ? ?Subjective:  ? ?Patient ID: Kenneth Fuller, male    DOB: [birth year], 76 y.o.   MRN: [id number] ? ?HPI: ?Kenneth Fuller is a 76 y.o. male presenting on 04/27/2021 for Medical Management of Chronic Issues and Diabetes ? ? ?HPI ?Type 2 diabetes mellitus ?Patient comes in today for recheck of his diabetes. Patient has been currently taking Farxiga and glimepiride A1c is up slightly at 7.3.. Patient is currently on an ACE inhibitor/ARB. Patient has not seen an ophthalmologist this year. Patient denies any issues with their feet. The symptom started onset as an adult hypertension and CHF and CKD and hyperlipidemia ARE RELATED TO DM  ? ?Hypertension and CHF CKD ?Patient is currently on hydralazine and isosorbide and olmesartan and, and their blood pressure today is 130/74. Patient denies any lightheadedness or dizziness. Patient denies headaches, blurred vision, chest pains, shortness of breath, or weakness. Denies any side effects from medication and is content with current medication.  ? ?GERD ?Patient is currently on omeprazole.  She denies any major symptoms or abdominal pain or belching or burping. She denies any blood in her stool or lightheadedness or dizziness.  ? ?Hyperlipidemia ?Patient is coming in for recheck of his hyperlipidemia. The patient is currently taking no medicine currently, has been intolerant. They deny any issues with myalgias or history of liver damage from it. They deny any focal numbness or weakness or chest pain.  ? ?Relevant past medical, surgical, family and social history reviewed and updated as indicated. Interim medical history since our last visit reviewed. ?Allergies and medications reviewed and updated. ? ?Review of Systems  ?Constitutional:  Negative for chills and fever.  ?Eyes:  Negative for visual disturbance.  ?Respiratory:  Negative for shortness of breath and  wheezing.   ?Cardiovascular:  Negative for chest pain and leg swelling.  ?Musculoskeletal:  Negative for back pain and gait problem.  ?Skin:  Negative for rash.  ?Neurological:  Negative for dizziness and weakness.  ?All other systems reviewed and are negative. ? ?Per HPI unless specifically indicated above ? ? ?Allergies as of 04/27/2021   ? ?   Reactions  ? Amlodipine Besy-benazepril Hcl Swelling, Other (See Comments)  ? Makes tongue swell (lotrel)  ? Hydrocodone Itching  ? Can tolerate in low doses  ? Oxycodone Itching  ? Phenergan [promethazine Hcl] Other (See Comments)  ? "I can't remember."  ? ?  ? ?  ?Medication List  ?  ? ?  ? Accurate as of April 27, 2021  4:05 PM. If you have any questions, ask your nurse or doctor.  ?  ?  ? ?  ? ?albuterol 108 (90 Base) MCG/ACT inhaler ?Commonly known as: VENTOLIN HFA ?Inhale 2 puffs into the lungs every 6 (six) hours as needed for wheezing or shortness of breath. ?  ?albuterol (2.5 MG/3ML) 0.083% nebulizer solution ?Commonly known as: PROVENTIL ?USE 1 VIAL IN NEBULIZER EVERY 6 HOURS AS NEEDED FOR WHEEZING AND FOR SHORTNESS OF BREATH ?  ?amoxicillin 500 MG capsule ?Commonly known as: AMOXIL ?Take 1 capsule (500 mg total) by mouth 2 (two) times daily. ?  ?bisoprolol 5 MG tablet ?Commonly known as: ZEBETA ?Take 1/2 (one-half) tablet by mouth once daily ?  ?Breztri Aerosphere 160-9-4.8 MCG/ACT Aero ?Generic drug: Budeson-Glycopyrrol-Formoterol ?Inhale 2 puffs into the lungs in the  morning and at bedtime. ?  ?cetirizine 10 MG tablet ?Commonly known as: ZYRTEC ?Take 1 tablet (10 mg total) by mouth daily. ?  ?cycloSPORINE 0.05 % ophthalmic emulsion ?Commonly known as: RESTASIS ?Apply to eye. ?  ?dapagliflozin propanediol 10 MG Tabs tablet ?Commonly known as: [place] ?Take 1 tablet (10 mg total) by mouth daily. ?  ?fluticasone 50 MCG/ACT nasal spray ?Commonly known as: FLONASE ?Place 2 sprays into both nostrils daily. ?  ?furosemide 40 MG tablet ?Commonly known as: LASIX ?Take 40  mg by mouth daily as needed (fluid retention.). ?  ?Gemtesa 75 MG Tabs ?Generic drug: Vibegron ?Take 1 tablet by mouth daily. ?  ?glimepiride 4 MG tablet ?Commonly known as: AMARYL ?Take by mouth. ?  ?hydrALAZINE 25 MG tablet ?Commonly known as: APRESOLINE ?TAKE 3 TABLETS BY MOUTH THREE TIMES DAILY ?  ?hydrALAZINE 50 MG tablet ?Commonly known as: APRESOLINE ?TAKE 1 TABLET BY MOUTH THREE TIMES DAILY ?  ?isosorbide dinitrate 20 MG tablet ?Commonly known as: ISORDIL ?Take 1 tablet (20 mg total) by mouth 3 (three) times daily. ?  ?meclizine 25 MG tablet ?Commonly known as: ANTIVERT ?Take 1 tablet (25 mg total) by mouth 3 (three) times daily as needed for dizziness. ?  ?mirabegron ER 25 MG Tb24 tablet ?Commonly known as: Myrbetriq ?Take 1 tablet (25 mg total) by mouth daily. ?  ?nitroGLYCERIN 0.4 MG SL tablet ?Commonly known as: NITROSTAT ?Place 1 tablet (0.4 mg total) under the tongue every 5 (five) minutes x 3 doses as needed for chest pain. ?  ?olmesartan 20 MG tablet ?Commonly known as: Benicar ?Take 1 tablet (20 mg total) by mouth daily. ?  ?omeprazole 40 MG capsule ?Commonly known as: PRILOSEC ?TAKE 1 CAPSULE BY MOUTH ONCE DAILY 30 TO 60 MINUTES PRIOR TO BREAKFAST ?What changed: See the new instructions. ?  ?OneTouch Ultra test strip ?Generic drug: glucose blood ?USE 1 STRIP TO CHECK GLUCOSE ONCE DAILY ?  ?roflumilast 500 MCG Tabs tablet ?Commonly known as: DALIRESP ?Take 1 tablet (500 mcg total) by mouth daily. ?  ?sertraline 100 MG tablet ?Commonly known as: ZOLOFT ?Take 1 tablet (100 mg total) by mouth daily. ?  ?spironolactone 25 MG tablet ?Commonly known as: ALDACTONE ?Take by mouth. ?  ?tamsulosin 0.4 MG Caps capsule ?Commonly known as: FLOMAX ?TAKE 1 CAPSULE BY MOUTH ONCE DAILY AFTER SUPPER ?  ?Vitamin D3 125 MCG (5000 UT) Tabs ?Take 5,000 Units by mouth daily. ?  ? ?  ? ? ? ?Objective:  ? ?BP 130/74   Pulse 78   Ht _0  (1.702 m)   Wt 252 lb (114.3 kg)   SpO2 93%   BMI 39.47 kg/m?   ?Wt Readings from  Last 3 Encounters:  ?04/27/21 252 lb (114.3 kg)  ?04/22/21 252 lb (114.3 kg)  ?04/17/21 255 lb (115.7 kg)  ?  ?Physical Exam ?Vitals and nursing note reviewed.  ?Constitutional:   ?   General: He is not in acute distress. ?   Appearance: He is well-developed. He is not diaphoretic.  ?Eyes:  ?   General: No scleral icterus. ?   Conjunctiva/sclera: Conjunctivae normal.  ?Neck:  ?   Thyroid: No thyromegaly.  ?Cardiovascular:  ?   Rate and Rhythm: Normal rate and regular rhythm.  ?   Heart sounds: Normal heart sounds. No murmur heard. ?Pulmonary:  ?   Effort: Pulmonary effort is normal. No respiratory distress.  ?   Breath sounds: Normal breath sounds. No wheezing.  ?Musculoskeletal:     ?  General: No swelling. Normal range of motion.  ?   Cervical back: Neck supple.  ?Lymphadenopathy:  ?   Cervical: No cervical adenopathy.  ?Skin: ?   General: Skin is warm and dry.  ?   Findings: No rash.  ?Neurological:  ?   Mental Status: He is alert and oriented to person, place, and time.  ?   Coordination: Coordination normal.  ?Psychiatric:     ?   Behavior: Behavior normal.  ? ? ? ? ?Assessment & Plan:  ? ?Problem List Items Addressed This Visit   ? ?  ? Cardiovascular and Mediastinum  ? Essential hypertension  ? Relevant Orders  ? CBC with Differential/Platelet  ? CMP14+EGFR  ? Lipid panel  ? Bayer DCA Hb A1c Waived  ? CHF (congestive heart failure) ([place])  ?  ? Digestive  ? GERD (gastroesophageal reflux disease)  ?  ? Endocrine  ? Diabetes mellitus type II, non insulin dependent ([place])  ? Relevant Orders  ? CBC with Differential/Platelet  ? CMP14+EGFR  ? Lipid panel  ? Bayer DCA Hb A1c Waived  ? Hyperlipidemia associated with type 2 diabetes mellitus ([place])  ? Relevant Orders  ? CBC with Differential/Platelet  ? CMP14+EGFR  ? Lipid panel  ? Bayer DCA Hb A1c Waived  ?  ? Genitourinary  ? CKD (chronic kidney disease), stage III ([place]) - Primary  ? Relevant Orders  ? CBC with Differential/Platelet  ? CMP14+EGFR  ? Lipid panel  ?  Bayer DCA Hb A1c Waived  ?  ?Patient seems to be doing well, no changes, continue current medicine.  A1c was up slightly at 7.3, focus on diet, we may reinstitute Ozempic in the future.  He does say he has some extra le

## 2021-04-28 LAB — CBC WITH DIFFERENTIAL/PLATELET
Basophils Absolute: 0.1 10*3/uL (ref 0.0–0.2)
Basos: 1 %
EOS (ABSOLUTE): 0.1 10*3/uL (ref 0.0–0.4)
Eos: 1 %
Hematocrit: 43.5 % (ref 37.5–51.0)
Hemoglobin: 14.6 g/dL (ref 13.0–17.7)
Immature Grans (Abs): 0 10*3/uL (ref 0.0–0.1)
Immature Granulocytes: 0 %
Lymphocytes Absolute: 2.6 10*3/uL (ref 0.7–3.1)
Lymphs: 26 %
MCH: 26.4 pg — ABNORMAL LOW (ref 26.6–33.0)
MCHC: 33.6 g/dL (ref 31.5–35.7)
MCV: 79 fL (ref 79–97)
Monocytes Absolute: 0.6 10*3/uL (ref 0.1–0.9)
Monocytes: 6 %
Neutrophils Absolute: 6.5 10*3/uL (ref 1.4–7.0)
Neutrophils: 66 %
Platelets: 249 10*3/uL (ref 150–450)
RBC: 5.53 x10E6/uL (ref 4.14–5.80)
RDW: 15.4 % (ref 11.6–15.4)
WBC: 9.8 10*3/uL (ref 3.4–10.8)

## 2021-04-28 LAB — CMP14+EGFR
ALT: 20 IU/L (ref 0–44)
AST: 22 IU/L (ref 0–40)
Albumin/Globulin Ratio: 1.8 (ref 1.2–2.2)
Albumin: 4.2 g/dL (ref 3.7–4.7)
Alkaline Phosphatase: 109 IU/L (ref 44–121)
BUN/Creatinine Ratio: 17 (ref 10–24)
BUN: 16 mg/dL (ref 8–27)
Bilirubin Total: 0.3 mg/dL (ref 0.0–1.2)
CO2: 22 mmol/L (ref 20–29)
Calcium: 9.4 mg/dL (ref 8.6–10.2)
Chloride: 101 mmol/L (ref 96–106)
Creatinine, Ser: 0.94 mg/dL (ref 0.76–1.27)
Globulin, Total: 2.4 g/dL (ref 1.5–4.5)
Glucose: 189 mg/dL — ABNORMAL HIGH (ref 70–99)
Potassium: 4.7 mmol/L (ref 3.5–5.2)
Sodium: 140 mmol/L (ref 134–144)
Total Protein: 6.6 g/dL (ref 6.0–8.5)
eGFR: 85 mL/min/{1.73_m2} (ref >59)

## 2021-04-28 LAB — LIPID PANEL
Chol/HDL Ratio: 2 ratio (ref 0.0–5.0)
Cholesterol, Total: 92 mg/dL — ABNORMAL LOW (ref 100–199)
HDL: 45 mg/dL (ref >39)
LDL Chol Calc (NIH): 24 mg/dL (ref 0–99)
Triglycerides: 130 mg/dL (ref 0–149)
VLDL Cholesterol Cal: 23 mg/dL (ref 5–40)

## 2021-04-29 DIAGNOSIS — J449 Chronic obstructive pulmonary disease, unspecified: Secondary | ICD-10-CM | POA: Diagnosis not present

## 2021-05-01 ENCOUNTER — Other Ambulatory Visit: Payer: Self-pay | Admitting: Nurse Practitioner

## 2021-05-01 ENCOUNTER — Telehealth: Payer: PPO

## 2021-05-01 DIAGNOSIS — R350 Frequency of micturition: Secondary | ICD-10-CM

## 2021-05-01 DIAGNOSIS — R3915 Urgency of urination: Secondary | ICD-10-CM

## 2021-05-06 ENCOUNTER — Ambulatory Visit (INDEPENDENT_AMBULATORY_CARE_PROVIDER_SITE_OTHER): Payer: PPO | Admitting: Licensed Clinical Social Worker

## 2021-05-06 DIAGNOSIS — I5032 Chronic diastolic (congestive) heart failure: Secondary | ICD-10-CM

## 2021-05-06 DIAGNOSIS — F339 Major depressive disorder, recurrent, unspecified: Secondary | ICD-10-CM

## 2021-05-06 DIAGNOSIS — I10 Essential (primary) hypertension: Secondary | ICD-10-CM

## 2021-05-06 DIAGNOSIS — I2510 Atherosclerotic heart disease of native coronary artery without angina pectoris: Secondary | ICD-10-CM

## 2021-05-06 DIAGNOSIS — E119 Type 2 diabetes mellitus without complications: Secondary | ICD-10-CM

## 2021-05-06 DIAGNOSIS — J441 Chronic obstructive pulmonary disease with (acute) exacerbation: Secondary | ICD-10-CM

## 2021-05-06 DIAGNOSIS — N1832 Chronic kidney disease, stage 3b: Secondary | ICD-10-CM

## 2021-05-06 NOTE — Chronic Care Management (AMB) (Signed)
?Chronic Care Management  ? ? Clinical Social Work Note ? ?05/06/2021 ?Name: Kenneth Fuller MRN: [id number] DOB: 23-Sep-1945 ? ?Kenneth Fuller is a 76 y.o. year old male who is a primary care patient of [name], [name], MD. The CCM team was consulted to assist the patient with chronic disease management and/or care coordination needs related to: [name] .  ? ?Engaged with patient by telephone for follow up visit in response to provider referral for social work chronic care management and care coordination services.  ? ?Consent to Services:  ?The patient was given information about Chronic Care Management services, agreed to services, and gave verbal consent prior to initiation of services.  Please see initial visit note for detailed documentation.  ? ?Patient agreed to services and consent obtained.  ? ?Assessment: Review of patient past medical history, allergies, medications, and health status, including review of relevant consultants reports was performed today as part of a comprehensive evaluation and provision of chronic care management and care coordination services.    ? ?SDOH (Social Determinants of Health) assessments and interventions performed:  ?SDOH Interventions   ? ?Flowsheet Row Most Recent Value  ?SDOH Interventions   ?Stress Interventions Other (Comment)  [client has stress related to managing medical needs]  ?Depression Interventions/Treatment  Medication, Counseling  ? ?  ?  ? ?Advanced Directives Status: See [employer] application for related entries. ? ?CCM Care Plan ? ?Allergies  ?Allergen Reactions  ? Amlodipine Besy-Benazepril Hcl Swelling and Other (See Comments)  ?  Makes tongue swell (lotrel)  ? Hydrocodone Itching  ?  Can tolerate in low doses  ? Oxycodone Itching  ? Phenergan [Promethazine Hcl] Other (See Comments)  ?  "I can't remember."  ? ? ?Outpatient Encounter Medications as of 05/06/2021  ?Medication Sig Note  ? albuterol (PROVENTIL) (2.5 MG/3ML) 0.083% nebulizer  solution USE 1 VIAL IN NEBULIZER EVERY 6 HOURS AS NEEDED FOR WHEEZING AND FOR SHORTNESS OF BREATH   ? albuterol (VENTOLIN HFA) 108 (90 Base) MCG/ACT inhaler Inhale 2 puffs into the lungs every 6 (six) hours as needed for wheezing or shortness of breath.   ? amoxicillin (AMOXIL) 500 MG capsule Take 1 capsule (500 mg total) by mouth 2 (two) times daily.   ? bisoprolol (ZEBETA) 5 MG tablet Take 1/2 (one-half) tablet by mouth once daily   ? Budeson-Glycopyrrol-Formoterol (BREZTRI AEROSPHERE) 160-9-4.8 MCG/ACT AERO Inhale 2 puffs into the lungs in the morning and at bedtime. 03/27/2021: [place] ?  ? cetirizine (ZYRTEC) 10 MG tablet Take 1 tablet (10 mg total) by mouth daily.   ? Cholecalciferol (VITAMIN D3) 125 MCG (5000 UT) TABS Take 5,000 Units by mouth daily.   ? cycloSPORINE (RESTASIS) 0.05 % ophthalmic emulsion Apply to eye.   ? dapagliflozin propanediol (FARXIGA) 10 MG TABS tablet Take 1 tablet (10 mg total) by mouth daily. 03/27/2021: Via [place] ?  ? fluticasone (FLONASE) 50 MCG/ACT nasal spray Place 2 sprays into both nostrils daily.   ? furosemide (LASIX) 40 MG tablet Take 40 mg by mouth daily as needed (fluid retention.).   ? GEMTESA 75 MG TABS Take 1 tablet by mouth daily.   ? glimepiride (AMARYL) 4 MG tablet Take by mouth.   ? hydrALAZINE (APRESOLINE) 25 MG tablet TAKE 3 TABLETS BY MOUTH THREE TIMES DAILY   ? hydrALAZINE (APRESOLINE) 50 MG tablet TAKE 1 TABLET BY MOUTH THREE TIMES DAILY   ? isosorbide dinitrate (ISORDIL) 20 MG tablet Take 1 tablet (  20 mg total) by mouth 3 (three) times daily.   ? meclizine (ANTIVERT) 25 MG tablet Take 1 tablet (25 mg total) by mouth 3 (three) times daily as needed for dizziness.   ? mirabegron ER (MYRBETRIQ) 25 MG TB24 tablet Take 1 tablet (25 mg total) by mouth daily.   ? nitroGLYCERIN (NITROSTAT) 0.4 MG SL tablet Place 1 tablet (0.4 mg total) under the tongue every 5 (five) minutes x 3 doses as needed for chest pain.  (Patient not taking: Reported on 04/17/2021) 04/17/2021: Patient has if needed  ? olmesartan (BENICAR) 20 MG tablet Take 1 tablet (20 mg total) by mouth daily.   ? omeprazole (PRILOSEC) 40 MG capsule TAKE 1 CAPSULE BY MOUTH ONCE DAILY 30 TO 60 MINUTES PRIOR TO BREAKFAST (Patient taking differently: Take 40 mg by mouth daily.)   ? ONETOUCH ULTRA test strip USE 1 STRIP TO CHECK GLUCOSE ONCE DAILY   ? roflumilast (DALIRESP) 500 MCG TABS tablet Take 1 tablet (500 mcg total) by mouth daily. 03/27/2021: Via [place] ?  ? sertraline (ZOLOFT) 100 MG tablet Take 1 tablet (100 mg total) by mouth daily.   ? spironolactone (ALDACTONE) 25 MG tablet Take by mouth.   ? tamsulosin (FLOMAX) 0.4 MG CAPS capsule TAKE 1 CAPSULE BY MOUTH ONCE DAILY AFTER SUPPER   ? ?No facility-administered encounter medications on file as of 05/06/2021.  ? ? ?Patient Active Problem List  ? Diagnosis Date Noted  ? Chronic respiratory failure with hypoxia ([place]) 10/20/2020  ? Insomnia   ? CHF (congestive heart failure) ([place]) 01/06/2018  ? Depression with anxiety 01/04/2018  ? CKD (chronic kidney disease), stage III ([place]) 01/04/2018  ? COPD GOLD 0/  AB 01/04/2018  ? Swelling of lower extremity 11/30/2016  ? Morbid obesity ([place]) 08/21/2015  ? Coronary artery disease involving native coronary artery of native heart without angina pectoris 12/05/2014  ? Urinary urgency 10/15/2014  ? Spinal stenosis, lumbar region, with neurogenic claudication 01/02/2014  ? Hyperlipidemia associated with type 2 diabetes mellitus ([place]) 10/25/2013  ? Hyperlipidemia, unspecified 10/25/2013  ? COLONIC POLYPS, ADENOMATOUS 02/25/2007  ? Diabetes mellitus type II, non insulin dependent ([place]) 02/25/2007  ? Gout 02/25/2007  ? Essential hypertension 02/25/2007  ? Cough variant asthma 02/25/2007  ? OSA on CPAP 02/25/2007  ? FATIGUE, CHRONIC 02/25/2007  ? HEADACHE, CHRONIC 02/25/2007  ? GERD (gastroesophageal reflux disease) 02/25/2007  ? ? ?Conditions to be  addressed/monitored: monitor client management of health issues faced ? ?Care Plan : LCSW Care plan  ?Updates made by Katha Cabal, LCSW since 05/06/2021 12:00 AM  ?  ? ?Problem: Coping Skills (General Plan of Care)   ?  ? ?Goal: Manage Health needs and manage anxiety or stress issues related to health needs   ?Start Date: 05/06/2021  ?Expected End Date: 08/05/2021  ?This Visit's Progress: On track  ?Recent Progress: On track  ?Priority: Medium  ?Note:   ?Current barriers:   ?Patient in need of assistance with connecting to community resources for possible assistance in managing his ongoing medical needs ?Patient is unable to independently navigate community resource options without care coordination support ?Mobility issues ?Pain issues ?Breathing challenges ? ?Clinical Goals:  ?patient will work with SW in next 30 days to address concerns related to health issues of client and management of health issues of client ?Patient will call RNCM or LCSW as needed in next 30 days for CCM support ?Patient will attend all scheduled client medical appointments in next 30 days ? ?  Clinical Interventions:  ?Collaboration with [name], [name], MD regarding development and update of comprehensive plan of care as evidenced by provider attestation and co-signature ?Discussed with client the breathing issues of client (client said he uses inhaler as needed and uses nebulizer for treatments as prescribed. Client said he uses oxygen at night to help him breath better) ?Encouraged client to talk with RNCM as needed for CCM nursing support ?Reviewed with client sleeping challenges of client. He said he uses Bi-Pap to help him sleep at night. He said it has been challenging to use Bi-Pap lately due to his coughing and his congestion ?Reviewed with client his family support (has support from his wife and daughter, has support from his son; has support from his granddaughter) ?Reviewed with client the medication procurement of  client. ?Reviewed mood status of client. Shiven said he is taking medications as prescribed.  He said he thought his mood was stable. He did not mention any mood problems. He has said that he has strong family support and

## 2021-05-06 NOTE — Patient Instructions (Addendum)
Visit Information ? ?Patient Goals:  Protect My Health (Patient). Manage Health issues of client. Manage anxiety issues of client ? ?Timeframe:  Short Term Goal ?Priority:  Medium ?Progress; On Track ? ?Start Date:  05/06/21                                   ?Expected End Date:  08/05/21                  ? ?Follow Up Date 06/25/21 at 10:00 AM  ?  ?Protect My Health (Patient)  Manage Health issues of client; manage anxiety issues of client  ?  ?Why is this important?   ?Screening tests can find diseases early when they are easier to treat.  ?Your doctor or nurse will talk with you about which tests are important for you.  ?Getting shots for common diseases like the flu and shingles will help prevent them.   ?  ?Patient Coping Skills/Strengths: ?Has family support ?Has no transport issues ?Attends scheduled medical appointments ?Takes medications as prescribed ?Completes ADLs independently ? ?Patient Deficits: ?Some mobility challenges ?Pain issues ?Breathing challenges ? ?Patient Goals:  ?Patient will attend all scheduled client medical appointments in next 30 days ?Patient will call RNCM or LCSW as needed for CCM support in next 30 days ?Patient will communicate regularly with his spouse in next 30 days to discuss needs of client ? ?Follow Up Plan:  LCSW to call client or his spouse on 06/25/21 at 10:00 AM  ? ?[name].Amol Domanski MSW, LCSW ?Licensed Clinical Social Worker ?[facility] ?[phone] ?

## 2021-05-08 DIAGNOSIS — F339 Major depressive disorder, recurrent, unspecified: Secondary | ICD-10-CM

## 2021-05-08 DIAGNOSIS — I10 Essential (primary) hypertension: Secondary | ICD-10-CM

## 2021-05-08 DIAGNOSIS — R0902 Hypoxemia: Secondary | ICD-10-CM | POA: Diagnosis not present

## 2021-05-08 DIAGNOSIS — I5032 Chronic diastolic (congestive) heart failure: Secondary | ICD-10-CM

## 2021-05-08 DIAGNOSIS — J441 Chronic obstructive pulmonary disease with (acute) exacerbation: Secondary | ICD-10-CM

## 2021-05-08 DIAGNOSIS — G4733 Obstructive sleep apnea (adult) (pediatric): Secondary | ICD-10-CM | POA: Diagnosis not present

## 2021-05-08 DIAGNOSIS — I2510 Atherosclerotic heart disease of native coronary artery without angina pectoris: Secondary | ICD-10-CM

## 2021-05-08 DIAGNOSIS — E119 Type 2 diabetes mellitus without complications: Secondary | ICD-10-CM

## 2021-05-08 DIAGNOSIS — J449 Chronic obstructive pulmonary disease, unspecified: Secondary | ICD-10-CM | POA: Diagnosis not present

## 2021-05-25 DIAGNOSIS — E1142 Type 2 diabetes mellitus with diabetic polyneuropathy: Secondary | ICD-10-CM | POA: Diagnosis not present

## 2021-05-25 DIAGNOSIS — E1159 Type 2 diabetes mellitus with other circulatory complications: Secondary | ICD-10-CM | POA: Diagnosis not present

## 2021-05-25 DIAGNOSIS — Z7984 Long term (current) use of oral hypoglycemic drugs: Secondary | ICD-10-CM | POA: Diagnosis not present

## 2021-05-25 DIAGNOSIS — I5032 Chronic diastolic (congestive) heart failure: Secondary | ICD-10-CM | POA: Diagnosis not present

## 2021-05-25 DIAGNOSIS — N183 Chronic kidney disease, stage 3 unspecified: Secondary | ICD-10-CM | POA: Diagnosis not present

## 2021-05-25 DIAGNOSIS — J449 Chronic obstructive pulmonary disease, unspecified: Secondary | ICD-10-CM | POA: Diagnosis not present

## 2021-05-25 DIAGNOSIS — I252 Old myocardial infarction: Secondary | ICD-10-CM | POA: Diagnosis not present

## 2021-05-25 DIAGNOSIS — I10 Essential (primary) hypertension: Secondary | ICD-10-CM | POA: Diagnosis not present

## 2021-05-25 DIAGNOSIS — E261 Secondary hyperaldosteronism: Secondary | ICD-10-CM | POA: Diagnosis not present

## 2021-05-25 DIAGNOSIS — Z87891 Personal history of nicotine dependence: Secondary | ICD-10-CM | POA: Diagnosis not present

## 2021-05-25 DIAGNOSIS — E1151 Type 2 diabetes mellitus with diabetic peripheral angiopathy without gangrene: Secondary | ICD-10-CM | POA: Diagnosis not present

## 2021-05-25 DIAGNOSIS — E1122 Type 2 diabetes mellitus with diabetic chronic kidney disease: Secondary | ICD-10-CM | POA: Diagnosis not present

## 2021-05-28 DIAGNOSIS — G4733 Obstructive sleep apnea (adult) (pediatric): Secondary | ICD-10-CM | POA: Diagnosis not present

## 2021-05-28 DIAGNOSIS — R0902 Hypoxemia: Secondary | ICD-10-CM | POA: Diagnosis not present

## 2021-05-28 DIAGNOSIS — I5032 Chronic diastolic (congestive) heart failure: Secondary | ICD-10-CM | POA: Diagnosis not present

## 2021-05-30 ENCOUNTER — Other Ambulatory Visit: Payer: Self-pay | Admitting: Family Medicine

## 2021-05-30 DIAGNOSIS — J449 Chronic obstructive pulmonary disease, unspecified: Secondary | ICD-10-CM | POA: Diagnosis not present

## 2021-05-30 DIAGNOSIS — I5042 Chronic combined systolic (congestive) and diastolic (congestive) heart failure: Secondary | ICD-10-CM

## 2021-05-30 DIAGNOSIS — I10 Essential (primary) hypertension: Secondary | ICD-10-CM

## 2021-06-07 DIAGNOSIS — R0902 Hypoxemia: Secondary | ICD-10-CM | POA: Diagnosis not present

## 2021-06-07 DIAGNOSIS — J449 Chronic obstructive pulmonary disease, unspecified: Secondary | ICD-10-CM | POA: Diagnosis not present

## 2021-06-07 DIAGNOSIS — I5032 Chronic diastolic (congestive) heart failure: Secondary | ICD-10-CM | POA: Diagnosis not present

## 2021-06-07 DIAGNOSIS — G4733 Obstructive sleep apnea (adult) (pediatric): Secondary | ICD-10-CM | POA: Diagnosis not present

## 2021-06-12 ENCOUNTER — Ambulatory Visit: Payer: PPO | Admitting: Pulmonary Disease

## 2021-06-22 ENCOUNTER — Other Ambulatory Visit: Payer: PPO

## 2021-06-22 DIAGNOSIS — I129 Hypertensive chronic kidney disease with stage 1 through stage 4 chronic kidney disease, or unspecified chronic kidney disease: Secondary | ICD-10-CM | POA: Diagnosis not present

## 2021-06-22 DIAGNOSIS — N189 Chronic kidney disease, unspecified: Secondary | ICD-10-CM | POA: Diagnosis not present

## 2021-06-22 DIAGNOSIS — E1129 Type 2 diabetes mellitus with other diabetic kidney complication: Secondary | ICD-10-CM | POA: Diagnosis not present

## 2021-06-22 DIAGNOSIS — I5042 Chronic combined systolic (congestive) and diastolic (congestive) heart failure: Secondary | ICD-10-CM | POA: Diagnosis not present

## 2021-06-22 DIAGNOSIS — E559 Vitamin D deficiency, unspecified: Secondary | ICD-10-CM | POA: Diagnosis not present

## 2021-06-22 DIAGNOSIS — E1122 Type 2 diabetes mellitus with diabetic chronic kidney disease: Secondary | ICD-10-CM | POA: Diagnosis not present

## 2021-06-22 DIAGNOSIS — R809 Proteinuria, unspecified: Secondary | ICD-10-CM | POA: Diagnosis not present

## 2021-06-25 ENCOUNTER — Ambulatory Visit (INDEPENDENT_AMBULATORY_CARE_PROVIDER_SITE_OTHER): Payer: PPO | Admitting: Licensed Clinical Social Worker

## 2021-06-25 DIAGNOSIS — F339 Major depressive disorder, recurrent, unspecified: Secondary | ICD-10-CM

## 2021-06-25 DIAGNOSIS — E119 Type 2 diabetes mellitus without complications: Secondary | ICD-10-CM

## 2021-06-25 DIAGNOSIS — I5032 Chronic diastolic (congestive) heart failure: Secondary | ICD-10-CM

## 2021-06-25 DIAGNOSIS — I2510 Atherosclerotic heart disease of native coronary artery without angina pectoris: Secondary | ICD-10-CM

## 2021-06-25 DIAGNOSIS — J441 Chronic obstructive pulmonary disease with (acute) exacerbation: Secondary | ICD-10-CM

## 2021-06-25 DIAGNOSIS — N1832 Chronic kidney disease, stage 3b: Secondary | ICD-10-CM

## 2021-06-25 DIAGNOSIS — I10 Essential (primary) hypertension: Secondary | ICD-10-CM

## 2021-06-25 NOTE — Patient Instructions (Addendum)
Visit Information  Patient Goals:  Protect My Health (Patient).  Manage Health issues of client. Manage anxiety issues of client  Timeframe:  Short Term Goal Priority:  Medium Progress; On Track  Start Date:  05/06/21                                   Expected End Date:  09/01/21                     Follow Up Date 08/04/21 at 1:00 PM     Protect My Health (Patient)  Manage Health issues of client; manage anxiety issues of client    Why is this important?   Screening tests can find diseases early when they are easier to treat.  Your doctor or nurse will talk with you about which tests are important for you.  Getting shots for common diseases like the flu and shingles will help prevent them.     Patient Coping Skills/Strengths: Has family support Has no transport issues Attends scheduled medical appointments Takes medications as prescribed Completes ADLs independently  Patient Deficits: Some mobility challenges Pain issues Breathing challenges  Patient Goals:  Patient will attend all scheduled client medical appointments in next 30 days Patient will call RNCM or LCSW as needed for CCM support in next 30 days Patient will communicate regularly with his spouse in next 30 days to discuss needs of client  Follow Up Plan:  LCSW to call client or his spouse on 08/04/21 at 1:00 PM   [name].Starnisha Batrez MSW, [place] [employer] representative [facility] Care Management [phone]

## 2021-06-25 NOTE — Chronic Care Management (AMB) (Signed)
Chronic Care Management    Clinical Social Work Note  06/25/2021 Name: Kenneth Fuller MRN: [id number] DOB: [birth year]  Kenneth Fuller is a 76 y.o. year old male who is a primary care patient of [name], [name], MD. The CCM team was consulted to assist the patient with chronic disease management and/or care coordination needs related to: [name] .   Engaged with patient by telephone for follow up visit in response to provider referral for social work chronic care management and care coordination services.   Consent to Services:  The patient was given information about Chronic Care Management services, agreed to services, and gave verbal consent prior to initiation of services.  Please see initial visit note for detailed documentation.   Patient agreed to services and consent obtained.   Assessment: Review of patient past medical history, allergies, medications, and health status, including review of relevant consultants reports was performed today as part of a comprehensive evaluation and provision of chronic care management and care coordination services.     SDOH (Social Determinants of Health) assessments and interventions performed:  SDOH Interventions    Flowsheet Row Most Recent Value  SDOH Interventions   Physical Activity Interventions Other (Comments)  [walking challenges. client is fatigued occasionally when walking]  Stress Interventions Provide Counseling  [client has stress related to managing medical needs]  Depression Interventions/Treatment  Counseling        Advanced Directives Status: See Vynca application for related entries.  CCM Care Plan  Allergies  Allergen Reactions   Amlodipine Besy-Benazepril Hcl Swelling and Other (See Comments)    Makes tongue swell (lotrel)   Hydrocodone Itching    Can tolerate in low doses   Oxycodone Itching   Phenergan [Promethazine Hcl] Other (See Comments)    "I can't remember."    Outpatient  Encounter Medications as of 06/25/2021  Medication Sig Note   albuterol (PROVENTIL) (2.5 MG/3ML) 0.083% nebulizer solution USE 1 VIAL IN NEBULIZER EVERY 6 HOURS AS NEEDED FOR WHEEZING AND FOR SHORTNESS OF BREATH    albuterol (VENTOLIN HFA) 108 (90 Base) MCG/ACT inhaler Inhale 2 puffs into the lungs every 6 (six) hours as needed for wheezing or shortness of breath.    amoxicillin (AMOXIL) 500 MG capsule Take 1 capsule (500 mg total) by mouth 2 (two) times daily.    bisoprolol (ZEBETA) 5 MG tablet Take 1/2 (one-half) tablet by mouth once daily    Budeson-Glycopyrrol-Formoterol (BREZTRI AEROSPHERE) 160-9-4.8 MCG/ACT AERO Inhale 2 puffs into the lungs in the morning and at bedtime. 03/27/2021: Via [place] patient assistance [place]    cetirizine (ZYRTEC) 10 MG tablet Take 1 tablet (10 mg total) by mouth daily.    Cholecalciferol (VITAMIN D3) 125 MCG (5000 UT) TABS Take 5,000 Units by mouth daily.    cycloSPORINE (RESTASIS) 0.05 % ophthalmic emulsion Apply to eye.    dapagliflozin propanediol (FARXIGA) 10 MG TABS tablet Take 1 tablet (10 mg total) by mouth daily. 03/27/2021: Via [place] patient assistance program    fluticasone (FLONASE) 50 MCG/ACT nasal spray Place 2 sprays into both nostrils daily.    furosemide (LASIX) 40 MG tablet Take 40 mg by mouth daily as needed (fluid retention.).    GEMTESA 75 MG TABS Take 1 tablet by mouth daily.    glimepiride (AMARYL) 4 MG tablet Take by mouth.    hydrALAZINE (APRESOLINE) 25 MG tablet TAKE 3 TABLETS BY MOUTH THREE TIMES DAILY    hydrALAZINE (APRESOLINE) 50 MG tablet TAKE 1 TABLET BY MOUTH THREE  TIMES DAILY    isosorbide dinitrate (ISORDIL) 20 MG tablet Take 1 tablet (20 mg total) by mouth 3 (three) times daily.    meclizine (ANTIVERT) 25 MG tablet Take 1 tablet (25 mg total) by mouth 3 (three) times daily as needed for dizziness.    mirabegron ER (MYRBETRIQ) 25 MG TB24 tablet Take 1 tablet (25 mg total) by mouth daily.    nitroGLYCERIN (NITROSTAT) 0.4 MG SL  tablet Place 1 tablet (0.4 mg total) under the tongue every 5 (five) minutes x 3 doses as needed for chest pain. (Patient not taking: Reported on 04/17/2021) 04/17/2021: Patient has if needed   olmesartan (BENICAR) 20 MG tablet Take 1 tablet (20 mg total) by mouth daily.    omeprazole (PRILOSEC) 40 MG capsule TAKE 1 CAPSULE BY MOUTH ONCE DAILY 30 TO 60 MINUTES PRIOR TO BREAKFAST (Patient taking differently: Take 40 mg by mouth daily.)    ONETOUCH ULTRA test strip USE 1 STRIP TO CHECK GLUCOSE ONCE DAILY    roflumilast (DALIRESP) 500 MCG TABS tablet Take 1 tablet (500 mcg total) by mouth daily. 03/27/2021: Via [place] patient assistance program    sertraline (ZOLOFT) 100 MG tablet Take 1 tablet (100 mg total) by mouth daily.    spironolactone (ALDACTONE) 25 MG tablet Take by mouth.    tamsulosin (FLOMAX) 0.4 MG CAPS capsule TAKE 1 CAPSULE BY MOUTH ONCE DAILY AFTER SUPPER    No facility-administered encounter medications on file as of 06/25/2021.    Patient Active Problem List   Diagnosis Date Noted   Chronic respiratory failure with hypoxia ([place]) 10/20/2020   Insomnia    CHF (congestive heart failure) ([place]) 01/06/2018   Depression with anxiety 01/04/2018   CKD (chronic kidney disease), stage III ([place]) 01/04/2018   COPD GOLD 0/  AB 01/04/2018   Swelling of lower extremity 11/30/2016   Morbid obesity ([place]) 08/21/2015   Coronary artery disease involving native coronary artery of native heart without angina pectoris 12/05/2014   Urinary urgency 10/15/2014   Spinal stenosis, lumbar region, with neurogenic claudication 01/02/2014   Hyperlipidemia associated with type 2 diabetes mellitus ([place]) 10/25/2013   Hyperlipidemia, unspecified 10/25/2013   COLONIC POLYPS, ADENOMATOUS 02/25/2007   Diabetes mellitus type II, non insulin dependent ([place]) 02/25/2007   Gout 02/25/2007   Essential hypertension 02/25/2007   Cough variant asthma 02/25/2007   OSA on CPAP 02/25/2007   FATIGUE, CHRONIC 02/25/2007    HEADACHE, CHRONIC 02/25/2007   GERD (gastroesophageal reflux disease) 02/25/2007    Conditions to be addressed/monitored: monitor client management of client's health needs  Care Plan : LCSW Care plan  Updates made by Katha Cabal, LCSW since 06/25/2021 12:00 AM     Problem: Coping Skills (General Plan of Care)      Goal: Manage Health needs and manage anxiety or stress issues related to health needs   Start Date: 05/06/2021  Expected End Date: 09/01/2021  This Visit's Progress: On track  Recent Progress: On track  Priority: Medium  Note:   Current barriers:   Patient in need of assistance with connecting to community resources for possible assistance in managing his ongoing medical needs Patient is unable to independently navigate community resource options without care coordination support Mobility issues Pain issues Breathing challenges Fatigue issues  Clinical Goals:  patient will work with SW in next 30 days to address concerns related to health issues of client and management of health issues of client Patient will call RNCM or LCSW as needed in next 44  days for CCM support Patient will attend all scheduled client medical appointments in next 30 days  Clinical Interventions:  Collaboration with [name], [name], MD regarding development and update of comprehensive plan of care as evidenced by provider attestation and co-signature Discussed with client the breathing issues of client (client said he uses inhaler as needed and uses nebulizer for treatments as prescribed. Client said he uses oxygen at night to help him breath better). Client discussed his use of Bi-Pap machine.  He spoke of having dry mouth occasionally Encouraged client to talk with RNCM as needed for [place] with client his family support (has support from his wife and daughter, has support from his son; has support from his granddaughter) Reviewed with client the medication  procurement of client. Reviewed mood status of client. Niklaus said he is taking medications as prescribed.  He said he thought his mood was stable. He did not mention any mood problems. He has said that he has strong family support and that this is very helpful to him. Reviewed support of Pulmonologist.  [place] said he has appointment with Pulmonologist in June of 2023. [name]  Encouraged client to call LCSW as needed in next 30 days for CCM SW support Reviewed transport issues. Client drives himself to needed appointments and to complete errands Discussed client dizziness.  He said he does get dizzy occasionally. Client said he is taking medications as prescribed.   Patient Coping Skills/Strengths: Has family support Has no transport issues Attends scheduled medical appointments Takes medications as prescribed Completes ADLs independently  Patient Deficits: Some mobility challenges Pain issues Breathing challenges  Patient Goals:  Patient will attend all scheduled client medical appointments in next 30 days Patient will call RNCM or LCSW as needed for CCM support in next 30 days Patient will communicate regularly with his spouse in next 30 days to discuss needs of client  Follow Up Plan:  LCSW to call client or his spouse on 08/04/21 at 1:00 PM     [name].Farryn Linares MSW, [employer] representative [facility] Care Management [phone]

## 2021-06-27 DIAGNOSIS — R0902 Hypoxemia: Secondary | ICD-10-CM | POA: Diagnosis not present

## 2021-06-27 DIAGNOSIS — G4733 Obstructive sleep apnea (adult) (pediatric): Secondary | ICD-10-CM | POA: Diagnosis not present

## 2021-06-27 DIAGNOSIS — I5032 Chronic diastolic (congestive) heart failure: Secondary | ICD-10-CM | POA: Diagnosis not present

## 2021-06-29 DIAGNOSIS — J449 Chronic obstructive pulmonary disease, unspecified: Secondary | ICD-10-CM | POA: Diagnosis not present

## 2021-06-30 ENCOUNTER — Telehealth: Payer: PPO

## 2021-07-01 ENCOUNTER — Encounter: Payer: Self-pay | Admitting: Pulmonary Disease

## 2021-07-01 ENCOUNTER — Ambulatory Visit: Payer: PPO | Admitting: Pulmonary Disease

## 2021-07-01 DIAGNOSIS — E1122 Type 2 diabetes mellitus with diabetic chronic kidney disease: Secondary | ICD-10-CM | POA: Diagnosis not present

## 2021-07-01 DIAGNOSIS — I129 Hypertensive chronic kidney disease with stage 1 through stage 4 chronic kidney disease, or unspecified chronic kidney disease: Secondary | ICD-10-CM | POA: Diagnosis not present

## 2021-07-01 DIAGNOSIS — I5042 Chronic combined systolic (congestive) and diastolic (congestive) heart failure: Secondary | ICD-10-CM | POA: Diagnosis not present

## 2021-07-01 DIAGNOSIS — N189 Chronic kidney disease, unspecified: Secondary | ICD-10-CM | POA: Diagnosis not present

## 2021-07-01 DIAGNOSIS — J9611 Chronic respiratory failure with hypoxia: Secondary | ICD-10-CM | POA: Diagnosis not present

## 2021-07-01 DIAGNOSIS — J449 Chronic obstructive pulmonary disease, unspecified: Secondary | ICD-10-CM | POA: Diagnosis not present

## 2021-07-01 DIAGNOSIS — G4733 Obstructive sleep apnea (adult) (pediatric): Secondary | ICD-10-CM

## 2021-07-01 DIAGNOSIS — E1129 Type 2 diabetes mellitus with other diabetic kidney complication: Secondary | ICD-10-CM | POA: Diagnosis not present

## 2021-07-01 DIAGNOSIS — Z6837 Body mass index (BMI) 37.0-37.9, adult: Secondary | ICD-10-CM | POA: Diagnosis not present

## 2021-07-01 DIAGNOSIS — R809 Proteinuria, unspecified: Secondary | ICD-10-CM | POA: Diagnosis not present

## 2021-07-01 DIAGNOSIS — Z9989 Dependence on other enabling machines and devices: Secondary | ICD-10-CM | POA: Diagnosis not present

## 2021-07-01 NOTE — Patient Instructions (Addendum)
  X ENT referral for inspire  Get back on Breztri daily OK to continue daliresp  X dc biPAP

## 2021-07-01 NOTE — Assessment & Plan Note (Addendum)
Much improved and we can discontinue oxygen if he has a period  Of stability

## 2021-07-01 NOTE — Progress Notes (Signed)
   Subjective:    Patient ID: Kenneth Fuller, male    DOB: 04/10/1945, 76 y.o.   MRN: [id number]  HPI  76 yo remote smoker for FU of asthma/ COPD and OSA Initial visit 08/2020, Kenneth Fuller has previously seen Dr. Chase Caller and other providers at [facility] location   [place] - chronic diastolic heart failure coronary artery disease diabetes, stage3 CKD -asthma as a child , failed medical exam , recurred 2018          Kenneth Fuller smoked less than 10 pack years before Kenneth Fuller quit in 1971.  Kenneth Fuller reports lifelong history of asthma.  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    Chief Complaint  Patient presents with   Follow-up    Having issues with bipap.    Kenneth Fuller had repeated exacerbations and episode of cough and congestion in February.  His wife was also sick After his last visit in February, we gave him a longer course of prednisone.  Kenneth Fuller also got back on Daliresp by his PCP Kenneth Fuller feels that Daliresp is really helped him.  Regardless Kenneth Fuller is feeling much improved to the point where Kenneth Fuller has stopped using Breztri.  Kenneth Fuller hardly uses rescue inhaler. Kenneth Fuller still has the oxygen at home but has not used it much. Kenneth Fuller is unable to tolerate the BiPAP, this dries him out too much Kenneth Fuller has tried different types of masks  Significant tests/ events reviewed  NPSG 06/2020 >> BiPAP 15/11 ,med FF mask 11/2015 NPSG  very severe OSA, AHI >100. Split night study showed optimal control with CPAP 5-20      PFTs 01/2021 no airway obstruction, moderate restriction ratio 74, FEV1 55%, FVC 53%, 12% bronchodilator response, FEV1 improved to 62%, DLCO 81%     12/2016 PFT >> ratio 84, severe restriction with reduced diffusion capacity.  Total lung capacity 72% FVC, 1.6 L / 40% and DLCO of 19.4/68%.   PFT 1/ 2019 showed FEV1 45%, ratio 68, FVC 48% consistent with moderate to severe airflow obstruction   Feno 01/26/2017 ->  8ppb     High resolution CT chest 01/2017 neg for ILD   CT angiogram chest  05/2020 clear lungs CT sinus March 09, 2017 clear sinuses  Review of Systems neg for any significant sore throat, dysphagia, itching, sneezing, nasal congestion or excess/ purulent secretions, fever, chills, sweats, unintended wt loss, pleuritic or exertional cp, hempoptysis, orthopnea pnd or change in chronic leg swelling. Also denies presyncope, palpitations, heartburn, abdominal pain, nausea, vomiting, diarrhea or change in bowel or urinary habits, dysuria,hematuria, rash, arthralgias, visual complaints, headache, numbness weakness or ataxia.     Objective:   Physical Exam   Gen[place], obese, in no distress ENT - no lesions, no post nasal drip Neck: No JVD, no thyromegaly, no carotid bruits Lungs: no use of accessory muscles, no dullness to percussion, decreased without rales or rhonchi  Cardiovascular: Rhythm regular, heart sounds  normal, no murmurs or gallops, no peripheral edema Musculoskeletal: No deformities, no cyanosis or clubbing , no tremors        Assessment & Plan:

## 2021-07-01 NOTE — Assessment & Plan Note (Signed)
Doubt he has significant COPD.  Variability in lung function seems to suggest asthma.  He really feels that Daliresp has helped him the most.  He has weaned himself off all inhalers. I suggested that he at least get back on Breztri for 3 to 4 months, we will reassess him if he has any longer period Of stability. In the meantime it is okay to continue [place]  for now

## 2021-07-01 NOTE — Assessment & Plan Note (Addendum)
He has been unable to tolerate PAP therapy.  OSA is very severe and dental appliance would not be optimal.  We will discontinue BiPAP since he is unable to tolerate  He is willing to explore hypoglossal nerve stimulator implant.  His BMI is slight high but he has lost significant weight from 257 pounds to his current weight of 240 pounds with his clothes on.  His BMI is around 36.  I have asked him to lose another 5 to 10 pounds but we will go ahead with ENT referral in the meantime

## 2021-07-05 ENCOUNTER — Other Ambulatory Visit: Payer: Self-pay | Admitting: Cardiovascular Disease

## 2021-07-05 ENCOUNTER — Other Ambulatory Visit: Payer: Self-pay | Admitting: Family Medicine

## 2021-07-05 DIAGNOSIS — I10 Essential (primary) hypertension: Secondary | ICD-10-CM

## 2021-07-05 DIAGNOSIS — I5042 Chronic combined systolic (congestive) and diastolic (congestive) heart failure: Secondary | ICD-10-CM

## 2021-07-08 ENCOUNTER — Telehealth: Payer: Self-pay

## 2021-07-08 DIAGNOSIS — I5032 Chronic diastolic (congestive) heart failure: Secondary | ICD-10-CM | POA: Diagnosis not present

## 2021-07-08 DIAGNOSIS — F339 Major depressive disorder, recurrent, unspecified: Secondary | ICD-10-CM

## 2021-07-08 DIAGNOSIS — I2510 Atherosclerotic heart disease of native coronary artery without angina pectoris: Secondary | ICD-10-CM

## 2021-07-08 DIAGNOSIS — E119 Type 2 diabetes mellitus without complications: Secondary | ICD-10-CM

## 2021-07-08 DIAGNOSIS — J441 Chronic obstructive pulmonary disease with (acute) exacerbation: Secondary | ICD-10-CM

## 2021-07-08 DIAGNOSIS — I10 Essential (primary) hypertension: Secondary | ICD-10-CM

## 2021-07-08 DIAGNOSIS — J449 Chronic obstructive pulmonary disease, unspecified: Secondary | ICD-10-CM | POA: Diagnosis not present

## 2021-07-08 DIAGNOSIS — G4733 Obstructive sleep apnea (adult) (pediatric): Secondary | ICD-10-CM | POA: Diagnosis not present

## 2021-07-08 DIAGNOSIS — R0902 Hypoxemia: Secondary | ICD-10-CM | POA: Diagnosis not present

## 2021-07-08 NOTE — Chronic Care Management (AMB) (Signed)
  Chronic Care Management Note  07/08/2021 Name: NINO AMANO MRN: [id number] DOB: 1945-06-13  [name] Picinich is a 76 y.o. year old male who is a primary care patient of [name], [name], MD and is actively engaged with the care management team. I reached out to [name] by phone today to assist with re-scheduling an initial visit with the Pharmacist  Follow up plan: Unsuccessful telephone outreach attempt made. A HIPAA compliant phone message was left for the patient providing contact information and requesting a return call.  The care management team will reach out to the patient again over the next 5 days.  If patient returns call to provider office, please advise to call [place]  at [place], [place], [place], [place] [address] Direct Dial: [phone] [email or web address] Website: [email or web address]

## 2021-07-21 NOTE — Chronic Care Management (AMB) (Signed)
  Chronic Care Management Note  07/21/2021 Name: Kenneth Fuller MRN: [id number] DOB: [birth year]  Kenneth Fuller is a 76 y.o. year old male who is a primary care patient of [name], [name], MD and is actively engaged with the care management team. I reached out to [name] by phone today to assist with re-scheduling a follow up visit with the Pharmacist  Follow up plan: Face to Face appointment with care management team member scheduled for: 08/14/2021   Noreene Larsson, [place], [place], [place] [address] Direct Dial: [phone] [email or web address] Website: [email or web address]

## 2021-07-29 ENCOUNTER — Telehealth: Payer: PPO

## 2021-07-29 ENCOUNTER — Ambulatory Visit: Payer: PPO | Admitting: Family Medicine

## 2021-07-30 ENCOUNTER — Encounter: Payer: Self-pay | Admitting: Family Medicine

## 2021-07-30 DIAGNOSIS — J449 Chronic obstructive pulmonary disease, unspecified: Secondary | ICD-10-CM | POA: Diagnosis not present

## 2021-08-02 ENCOUNTER — Other Ambulatory Visit: Payer: Self-pay | Admitting: Gastroenterology

## 2021-08-02 ENCOUNTER — Other Ambulatory Visit: Payer: Self-pay | Admitting: Family Medicine

## 2021-08-02 DIAGNOSIS — R3915 Urgency of urination: Secondary | ICD-10-CM

## 2021-08-02 DIAGNOSIS — R350 Frequency of micturition: Secondary | ICD-10-CM

## 2021-08-04 ENCOUNTER — Telehealth: Payer: PPO | Admitting: Licensed Clinical Social Worker

## 2021-08-04 ENCOUNTER — Telehealth: Payer: Self-pay | Admitting: Licensed Clinical Social Worker

## 2021-08-04 NOTE — Telephone Encounter (Signed)
  Chronic Care Management     Clinical Social Work Note   08/04/21 Name: Kenneth Fuller     MRN: [id number]       DOB: 1945-04-03   Kenneth Fuller is a 76 y.o. year old male who is a primary care patient of [name], [name], MD. The CCM team was consulted to assist the patient with chronic disease management and/or care coordination needs related to: [name] .    LCSW called client phone number 2 times today but LCSW was not able to speak with client via phone today. LCSW did leave phone message for [name] asking him to please call LCSW at [phone]

## 2021-08-05 ENCOUNTER — Telehealth: Payer: Self-pay

## 2021-08-05 ENCOUNTER — Ambulatory Visit: Payer: PPO | Admitting: Pulmonary Disease

## 2021-08-05 NOTE — Chronic Care Management (AMB) (Signed)
  Chronic Care Management Note  08/05/2021 Name: KNOXX BOEDING MRN: [id number] DOB: 11-27-1945  Kenneth Fuller is a 76 y.o. year old male who is a primary care patient of [name], [name], MD and is actively engaged with the care management team. I reached out to [name] by phone today to assist with re-scheduling a follow up visit with the Licensed Clinical Social Worker  Follow up plan: Unsuccessful telephone outreach attempt made. A HIPAA compliant phone message was left for the patient providing contact information and requesting a return call.  The care management team will reach out to the patient again over the next 7 days.  If patient returns call to provider office, please advise to call [place]   at [place], [place], [place], [place] [address] Direct Dial: [phone] [email or web address] Website: [email or web address]

## 2021-08-07 DIAGNOSIS — G4733 Obstructive sleep apnea (adult) (pediatric): Secondary | ICD-10-CM | POA: Diagnosis not present

## 2021-08-07 DIAGNOSIS — I5032 Chronic diastolic (congestive) heart failure: Secondary | ICD-10-CM | POA: Diagnosis not present

## 2021-08-07 DIAGNOSIS — J449 Chronic obstructive pulmonary disease, unspecified: Secondary | ICD-10-CM | POA: Diagnosis not present

## 2021-08-07 DIAGNOSIS — R0902 Hypoxemia: Secondary | ICD-10-CM | POA: Diagnosis not present

## 2021-08-07 NOTE — Progress Notes (Signed)
PT States he has already spoke LCSW

## 2021-08-10 ENCOUNTER — Ambulatory Visit: Payer: PPO

## 2021-08-10 NOTE — Chronic Care Management (AMB) (Signed)
Erroneous encounter. Please disregard.

## 2021-08-12 ENCOUNTER — Ambulatory Visit (INDEPENDENT_AMBULATORY_CARE_PROVIDER_SITE_OTHER): Payer: PPO

## 2021-08-12 VITALS — Ht 67.0 in | Wt 230.0 lb

## 2021-08-12 DIAGNOSIS — Z0000 Encounter for general adult medical examination without abnormal findings: Secondary | ICD-10-CM | POA: Diagnosis not present

## 2021-08-12 NOTE — Patient Instructions (Signed)
[name] , Thank you for taking time to come for your Medicare Wellness Visit. I appreciate your ongoing commitment to your health goals. Please review the following plan we discussed and let me know if I can assist you in the future.   Screening recommendations/referrals: Colonoscopy: Done 12/20/2019 Repeat in 5 years  Recommended yearly ophthalmology/optometry visit for glaucoma screening and checkup Recommended yearly dental visit for hygiene and checkup  Vaccinations: Influenza vaccine: Done 01/26/2021. Repeat annually  Pneumococcal vaccine: Done 12/04/2013 and 08/26/2011. Tdap vaccine: Done 11/08/2017 Repeat in 10 years  Shingles vaccine: Discussed. Available at your local pharmacy.    Covid-19: Discussed.  Advanced directives: Please bring a copy of your health care power of attorney and living will to the office to be added to your chart at your convenience.   Conditions/risks identified: Aim for 30 minutes of exercise or brisk walking, 6-8 glasses of water, and 5 servings of fruits and vegetables each day.   Next appointment: Follow up in one year for your annual wellness visit. 2024.  Preventive Care 75 Years and Older, Male  Preventive care refers to lifestyle choices and visits with your health care provider that can promote health and wellness. What does preventive care include? A yearly physical exam. This is also called an annual well check. Dental exams once or twice a year. Routine eye exams. Ask your health care provider how often you should have your eyes checked. Personal lifestyle choices, including: Daily care of your teeth and gums. Regular physical activity. Eating a healthy diet. Avoiding tobacco and drug use. Limiting alcohol use. Practicing safe sex. Taking low doses of aspirin every day. Taking vitamin and mineral supplements as recommended by your health care provider. What happens during an annual well check? The services and screenings done  by your health care provider during your annual well check will depend on your age, overall health, lifestyle risk factors, and family history of disease. Counseling  Your health care provider may ask you questions about your: Alcohol use. Tobacco use. Drug use. Emotional well-being. Home and relationship well-being. Sexual activity. Eating habits. History of falls. Memory and ability to understand (cognition). Work and work Statistician. Screening  You may have the following tests or measurements: Height, weight, and BMI. Blood pressure. Lipid and cholesterol levels. These may be checked every 5 years, or more frequently if you are over 75 years old. Skin check. Lung cancer screening. You may have this screening every year starting at age 75 if you have a 30-pack-year history of smoking and currently smoke or have quit within the past 15 years. Fecal occult blood test (FOBT) of the stool. You may have this test every year starting at age 75. Flexible sigmoidoscopy or colonoscopy. You may have a sigmoidoscopy every 5 years or a colonoscopy every 10 years starting at age 75. Prostate cancer screening. Recommendations will vary depending on your family history and other risks. Hepatitis C blood test. Hepatitis B blood test. Sexually transmitted disease (STD) testing. Diabetes screening. This is done by checking your blood sugar (glucose) after you have not eaten for a while (fasting). You may have this done every 1-3 years. Abdominal aortic aneurysm (AAA) screening. You may need this if you are a current or former smoker. Osteoporosis. You may be screened starting at age 75 if you are at high risk. Talk with your health care provider about your test results, treatment options, and if necessary, the need for more tests. Vaccines  Your health care provider  may recommend certain vaccines, such as: Influenza vaccine. This is recommended every year. Tetanus, diphtheria, and acellular  pertussis (Tdap, Td) vaccine. You may need a Td booster every 10 years. Zoster vaccine. You may need this after age 75. Pneumococcal 13-valent conjugate (PCV13) vaccine. One dose is recommended after age 75. Pneumococcal polysaccharide (PPSV23) vaccine. One dose is recommended after age 75. Talk to your health care provider about which screenings and vaccines you need and how often you need them. This information is not intended to replace advice given to you by your health care provider. Make sure you discuss any questions you have with your health care provider. Document Released: 02/21/2015 Document Revised: 10/15/2015 Document Reviewed: 11/26/2014 Elsevier Interactive Patient Education  2017 [place] Prevention in the Home Falls can cause injuries. They can happen to people of all ages. There are many things you can do to make your home safe and to help prevent falls. What can I do on the outside of my home? Regularly fix the edges of walkways and driveways and fix any cracks. Remove anything that might make you trip as you walk through a door, such as a raised step or threshold. Trim any bushes or trees on the path to your home. Use bright outdoor lighting. Clear any walking paths of anything that might make someone trip, such as rocks or tools. Regularly check to see if handrails are loose or broken. Make sure that both sides of any steps have handrails. Any raised decks and porches should have guardrails on the edges. Have any leaves, snow, or ice cleared regularly. Use sand or salt on walking paths during winter. Clean up any spills in your garage right away. This includes oil or grease spills. What can I do in the bathroom? Use night lights. Install grab bars by the toilet and in the tub and shower. Do not use towel bars as grab bars. Use non-skid mats or decals in the tub or shower. If you need to sit down in the shower, use a plastic, non-slip stool. Keep the floor  dry. Clean up any water that spills on the floor as soon as it happens. Remove soap buildup in the tub or shower regularly. Attach bath mats securely with double-sided non-slip rug tape. Do not have throw rugs and other things on the floor that can make you trip. What can I do in the bedroom? Use night lights. Make sure that you have a light by your bed that is easy to reach. Do not use any sheets or blankets that are too big for your bed. They should not hang down onto the floor. Have a firm chair that has side arms. You can use this for support while you get dressed. Do not have throw rugs and other things on the floor that can make you trip. What can I do in the kitchen? Clean up any spills right away. Avoid walking on wet floors. Keep items that you use a lot in easy-to-reach places. If you need to reach something above you, use a strong step stool that has a grab bar. Keep electrical cords out of the way. Do not use floor polish or wax that makes floors slippery. If you must use wax, use non-skid floor wax. Do not have throw rugs and other things on the floor that can make you trip. What can I do with my stairs? Do not leave any items on the stairs. Make sure that there are handrails on both sides  of the stairs and use them. Fix handrails that are broken or loose. Make sure that handrails are as long as the stairways. Check any carpeting to make sure that it is firmly attached to the stairs. Fix any carpet that is loose or worn. Avoid having throw rugs at the top or bottom of the stairs. If you do have throw rugs, attach them to the floor with carpet tape. Make sure that you have a light switch at the top of the stairs and the bottom of the stairs. If you do not have them, ask someone to add them for you. What else can I do to help prevent falls? Wear shoes that: Do not have high heels. Have rubber bottoms. Are comfortable and fit you well. Are closed at the toe. Do not wear  sandals. If you use a stepladder: Make sure that it is fully opened. Do not climb a closed stepladder. Make sure that both sides of the stepladder are locked into place. Ask someone to hold it for you, if possible. Clearly mark and make sure that you can see: Any grab bars or handrails. First and last steps. Where the edge of each step is. Use tools that help you move around (mobility aids) if they are needed. These include: Canes. Walkers. Scooters. Crutches. Turn on the lights when you go into a dark area. Replace any light bulbs as soon as they burn out. Set up your furniture so you have a clear path. Avoid moving your furniture around. If any of your floors are uneven, fix them. If there are any pets around you, be aware of where they are. Review your medicines with your doctor. Some medicines can make you feel dizzy. This can increase your chance of falling. Ask your doctor what other things that you can do to help prevent falls. This information is not intended to replace advice given to you by your health care provider. Make sure you discuss any questions you have with your health care provider. Document Released: 11/21/2008 Document Revised: 07/03/2015 Document Reviewed: 03/01/2014 Elsevier Interactive Patient Education  2017 [employer].

## 2021-08-12 NOTE — Progress Notes (Signed)
Subjective:   Kenneth Fuller is a 76 y.o. male who presents for Medicare Annual/Subsequent preventive examination. Virtual Visit via Telephone Note  I connected with  Kenneth Fuller on 08/12/21 at 10:30 AM EDT by telephone and verified that I am speaking with the correct person using two identifiers.  Location: Patient: HOME Provider: [facility] Persons participating in the virtual visit: patient/Nurse Health Advisor   I discussed the limitations, risks, security and privacy concerns of performing an evaluation and management service by telephone and the availability of in person appointments. The patient expressed understanding and agreed to proceed.  Interactive audio and video telecommunications were attempted between this nurse and patient, however failed, due to patient having technical difficulties OR patient did not have access to video capability.  We continued and completed visit with audio only.  Some vital signs may be absent or patient reported.   Chriss Driver, LPN  Review of Systems     Cardiac Risk Factors include: advanced age (>79mn, >>53women);diabetes mellitus;hypertension;dyslipidemia;male gender;sedentary lifestyle;obesity (BMI >30kg/m2);Other (see comment), Risk factor comments: COPD, CHF     Objective:    Today's Vitals   08/12/21 1042  Weight: 230 lb (104.3 kg)  Height: _0  (1.702 m)   Body mass index is 36.02 kg/m.     08/12/2021   10:49 AM 10/17/2020    6:02 PM 08/07/2020   10:23 AM 07/09/2020   11:55 PM 07/09/2020    1:59 PM 06/06/2020    9:00 PM 06/06/2020    2:22 PM  Advanced Directives  Does Patient Have a Medical Advance Directive? Yes No No  No No No  Type of Advance Directive [place]? No - copy requested        Would patient like information on creating a medical advance directive?  No - Patient declined Yes (MAU/Ambulatory/Procedural Areas - Information given)  No - Patient declined  Yes (Inpatient - patient defers creating a medical advance directive at this time - Information given)     Current Medications (verified) Outpatient Encounter Medications as of 08/12/2021  Medication Sig   albuterol (PROVENTIL) (2.5 MG/3ML) 0.083% nebulizer solution USE 1 VIAL IN NEBULIZER EVERY 6 HOURS AS NEEDED FOR WHEEZING AND FOR SHORTNESS OF BREATH   albuterol (VENTOLIN HFA) 108 (90 Base) MCG/ACT inhaler Inhale 2 puffs into the lungs every 6 (six) hours as needed for wheezing or shortness of breath.   bisoprolol (ZEBETA) 5 MG tablet Take 1/2 (one-half) tablet by mouth once daily   Budeson-Glycopyrrol-Formoterol (BREZTRI AEROSPHERE) 160-9-4.8 MCG/ACT AERO Inhale 2 puffs into the lungs in the morning and at bedtime.   cetirizine (ZYRTEC) 10 MG tablet Take 1 tablet (10 mg total) by mouth daily.   Cholecalciferol (VITAMIN D3) 125 MCG (5000 UT) TABS Take 5,000 Units by mouth daily.   cycloSPORINE (RESTASIS) 0.05 % ophthalmic emulsion Apply to eye.   dapagliflozin propanediol (FARXIGA) 10 MG TABS tablet Take 1 tablet (10 mg total) by mouth daily.   fluticasone (FLONASE) 50 MCG/ACT nasal spray Place 2 sprays into both nostrils daily.   furosemide (LASIX) 40 MG tablet Take 40 mg by mouth daily as needed (fluid retention.).   GEMTESA 75 MG TABS Take 1 tablet by mouth daily.   glimepiride (AMARYL) 4 MG tablet Take by mouth.   hydrALAZINE (APRESOLINE) 25 MG tablet TAKE 3 TABLETS BY MOUTH THREE TIMES DAILY   hydrALAZINE (APRESOLINE)  50 MG tablet TAKE 1 TABLET BY MOUTH THREE TIMES DAILY   isosorbide dinitrate (ISORDIL) 20 MG tablet Take 1 tablet (20 mg total) by mouth 3 (three) times daily.   meclizine (ANTIVERT) 25 MG tablet Take 1 tablet (25 mg total) by mouth 3 (three) times daily as needed for dizziness.   mirabegron ER (MYRBETRIQ) 25 MG TB24 tablet Take 1 tablet (25 mg total) by mouth daily.   nitroGLYCERIN (NITROSTAT) 0.4 MG SL tablet Place 1 tablet (0.4 mg total) under the  tongue every 5 (five) minutes x 3 doses as needed for chest pain. (Patient not taking: Reported on 04/17/2021)   olmesartan (BENICAR) 20 MG tablet Take 1 tablet (20 mg total) by mouth daily.   omeprazole (PRILOSEC) 40 MG capsule TAKE 1 CAPSULE BY MOUTH ONCE DAILY 30-60  MINUTES  PRIOR  TO  BREAKFAST   ONETOUCH ULTRA test strip USE 1 STRIP TO CHECK GLUCOSE ONCE DAILY   roflumilast (DALIRESP) 500 MCG TABS tablet Take 1 tablet (500 mcg total) by mouth daily.   sertraline (ZOLOFT) 100 MG tablet Take 1 tablet (100 mg total) by mouth daily.   spironolactone (ALDACTONE) 25 MG tablet Take by mouth.   tamsulosin (FLOMAX) 0.4 MG CAPS capsule TAKE 1 CAPSULE BY MOUTH ONCE DAILY AFTER SUPPER   [DISCONTINUED] hydrALAZINE (APRESOLINE) 50 MG tablet Take 1 tablet by mouth 3 (three) times daily.   No facility-administered encounter medications on file as of 08/12/2021.    Allergies (verified) Amlodipine besy-benazepril hcl, Hydrocodone, Oxycodone, and Phenergan [promethazine hcl]   History: Past Medical History:  Diagnosis Date   Allergy    Asthma    CAD (coronary artery disease)    CHF (congestive heart failure) (HCC)    COPD (chronic obstructive pulmonary disease) ([place])    Diabetes mellitus    Fibromyalgia    GERD (gastroesophageal reflux disease)    GI bleeding    Gout    Hyperlipidemia    Hypertension    Hypogonadism male    Insomnia    MRSA cellulitis    Neuropathy    Obesity    Shortness of breath dyspnea    with exertion    Sleep apnea    cpap- 14    Wheezing    no asthma diagnosis   Past Surgical History:  Procedure Laterality Date   BACK SURGERY     BIOPSY  12/20/2019   Procedure: BIOPSY;  Surgeon: Milus Banister, MD;  Location: [facility];  Service: Endoscopy;;   CARDIAC CATHETERIZATION     COLONOSCOPY     COLONOSCOPY WITH PROPOFOL N/A 12/20/2019   Procedure: COLONOSCOPY WITH PROPOFOL;  Surgeon: Milus Banister, MD;  Location: [facility];  Service: Endoscopy;   Laterality: N/A;   ESOPHAGOGASTRODUODENOSCOPY (EGD) WITH PROPOFOL N/A 12/20/2019   Procedure: ESOPHAGOGASTRODUODENOSCOPY (EGD) WITH PROPOFOL;  Surgeon: Milus Banister, MD;  Location: [facility];  Service: Endoscopy;  Laterality: N/A;   LEFT HEART CATH AND CORONARY ANGIOGRAPHY N/A 12/02/2016   Procedure: LEFT HEART CATH AND CORONARY ANGIOGRAPHY;  Surgeon: Jettie Booze, MD;  Location: [facility];  Service: Cardiovascular;  Laterality: N/A;   LUMBAR LAMINECTOMY/DECOMPRESSION MICRODISCECTOMY N/A 01/02/2014   Procedure: CENTRAL DECOMPRESSION LUMBAR LAMINECTOMY L3-L4, L4-L5;  Surgeon: Tobi Bastos, MD;  Location: [facility];  Service: Orthopedics;  Laterality: N/A;   neck fusion     POLYPECTOMY  12/20/2019   Procedure: POLYPECTOMY;  Surgeon: Milus Banister, MD;  Location: [facility];  Service: Endoscopy;;   Family History  Problem Relation Age of Onset   Colon cancer Mother    Diabetes Father        siblings   Heart disease Father        brother   Heart attack Father    Kidney disease Sister    Heart failure Sister    Heart disease Brother    Heart attack Son 33   Drug abuse Sister    Heart disease Brother    Deep vein thrombosis Brother    Colon polyps Neg Hx    Social History   Socioeconomic History   Marital status: Married    Spouse name: Butch Penny   Number of children: 3   Years of education: 8   Highest education level: GED or equivalent  Occupational History   Occupation: retired/disability 1999    Employer: DISABLED    Comment: Textiles  Tobacco Use   Smoking status: Former    Packs/day: 1.00    Types: Cigarettes    Start date: 02/09/1964    Quit date: 02/08/1969    Years since quitting: 52.5   Smokeless tobacco: Former    Quit date: 1971  Scientific laboratory technician Use: Never used  Substance and Sexual Activity   Alcohol use: No    Alcohol/week: 0.0 standard drinks of alcohol   Drug use: No   Sexual activity: Not Currently  Other Topics Concern    Not on file  Social History Narrative   Drinks caffeine tea occasionally   Lives with wife Butch Penny, children live nearby - [place] and Jeneen Rinks   One son, Heath Lark passed away last year suddenly   Social Determinants of Health   Financial Resource Strain: Low Risk  (08/12/2021)   Overall Financial Resource Strain (CARDIA)    Difficulty of Paying Living Expenses: Not hard at all  Food Insecurity: No Food Insecurity (08/12/2021)   Hunger Vital Sign    Worried About Running Out of Food in the Last Year: Never true    [place] in the Last Year: Never true  Transportation Needs: No Transportation Needs (08/12/2021)   PRAPARE - Hydrologist (Medical): No    Lack of Transportation (Non-Medical): No  Physical Activity: Inactive (08/12/2021)   Exercise Vital Sign    Days of Exercise per Week: 0 days    Minutes of Exercise per Session: 0 min  Stress: No Stress Concern Present (08/12/2021)   [place]    Feeling of Stress : Only a little  Recent Concern: Stress - Stress Concern Present (06/25/2021)   [place]    Feeling of Stress : To some extent  Social Connections: Socially Integrated (08/12/2021)   Social Connection and Isolation Panel [NHANES]    Frequency of Communication with Friends and Family: More than three times a week    Frequency of Social Gatherings with Friends and Family: More than three times a week    Attends Religious Services: More than 4 times per year    Active Member of [employer] or Organizations: Yes    Attends Music therapist: More than 4 times per year    Marital Status: Married    Tobacco Counseling Counseling given: Not Answered   Clinical Intake:  Pre-visit preparation completed: Yes  Pain : No/denies pain     BMI - recorded: 36.02 Nutritional Status: BMI > 30  Obese Nutritional Risks:  None  Diabetes: Yes  How often do you need to have someone help you when you read instructions, pamphlets, or other written materials from your doctor or pharmacy?: 1 - Never  Diabetic?Nutrition Risk Assessment:  Has the patient had any N/V/D within the last 2 months?  No  Does the patient have any non-healing wounds?  No  Has the patient had any unintentional weight loss or weight gain?  No   Diabetes:  Is the patient diabetic?  Yes  If diabetic, was a CBG obtained today?  No  Did the patient bring in their glucometer from home?  No  How often do you monitor your CBG's? Daily.   Financial Strains and Diabetes Management:  Are you having any financial strains with the device, your supplies or your medication? Yes .  Does the patient want to be seen by Chronic Care Management for management of their diabetes?  No  Would the patient like to be referred to a Nutritionist or for Diabetic Management?  No   Diabetic Exams:  Diabetic Eye Exam: Completed 08/20/2020.  Pt has been advised about the importance in completing this exam.  Diabetic Foot Exam: Completed 01/26/2021. Pt has been advised about the importance in completing this exam.   Interpreter Needed?: No  Information entered by :: mj Jaceon Heiberger, lpn   Activities of Daily Living    08/12/2021   10:51 AM  In your present state of health, do you have any difficulty performing the following activities:  Hearing? 0  Vision? 0  Difficulty concentrating or making decisions? 0  Walking or climbing stairs? 0  Dressing or bathing? 0  Doing errands, shopping? 0  Preparing Food and eating ? N  Using the Toilet? N  In the past six months, have you accidently leaked urine? N  Do you have problems with loss of bowel control? N  Managing your Medications? N  Managing your Finances? N  Housekeeping or managing your Housekeeping? N    Patient Care Team: [name], [name], MD as PCP - General (Family Medicine) Croitoru, Dani Gobble, MD as  PCP - Cardiology (Cardiology) Harlen Labs, MD as Referring Physician (Optometry) Shea Evans, [name], LCSW as Social Worker (Licensed Clinical Social Worker) Cori Razor, Delice Bison, RN as Case Manager Croitoru, Dani Gobble, MD as Consulting Physician (Cardiology) [name], [facility] (Pharmacist) Liana Gerold, MD as Consulting Physician (Nephrology) Milus Banister, MD as Attending Physician (Gastroenterology) Rigoberto Noel, MD as Consulting Physician (Pulmonary Disease)  Indicate any recent Medical Services you may have received from other than [facility] providers in the past year (date may be approximate).     Assessment:   This is a routine wellness examination for Naszir.  Hearing/Vision screen Hearing Screening - Comments:: No hearing issues.  Vision Screening - Comments:: Glasses. [facility] 08/2020.  Dietary issues and exercise activities discussed: Current Exercise Habits: The patient does not participate in regular exercise at present, Exercise limited by: cardiac condition(s);orthopedic condition(s);respiratory conditions(s)   Goals Addressed               This Visit's Progress     Weight (lb) < 200 lb (90.7 kg) (pt-stated)   230 lb (104.3 kg)     Pt states "I'm trying to lose weight" 08/12/21-Pt states he would like to continue to lose weight, currently down to 230#.       Depression Screen    08/12/2021   10:47 AM 06/25/2021    2:15 PM 05/06/2021   12:16  PM 03/12/2021   12:42 PM 01/26/2021    3:20 PM 01/26/2021    3:02 PM 01/14/2021   10:25 AM  PHQ 2/9 Scores  PHQ - 2 Score _0 0 2 2  PHQ- 9 Score _1 0 5 5    Fall Risk    08/12/2021   10:51 AM 03/09/2021   11:39 AM 01/26/2021    3:02 PM 01/05/2021    9:27 AM 01/05/2021    9:14 AM  Fall Risk   Falls in the past year? _2 0  Number falls in past yr: 0 0 0 0   Injury with Fall? 0 0 0 0   Risk for fall due to : History of fall(s) History of fall(s);Impaired balance/gait Impaired balance/gait  History of fall(s)   Follow up Falls prevention discussed Falls evaluation completed Falls evaluation completed Falls evaluation completed     FALL RISK PREVENTION PERTAINING TO THE HOME:  Any stairs in or around the home? Yes  If so, are there any without handrails? No  Home free of loose throw rugs in walkways, pet beds, electrical cords, etc? Yes  Adequate lighting in your home to reduce risk of falls? Yes   ASSISTIVE DEVICES UTILIZED TO PREVENT FALLS:  Life alert? No  Use of a cane, walker or w/c? No  Grab bars in the bathroom? Yes  Shower chair or bench in shower? Yes  Elevated toilet seat or a handicapped toilet? Yes   TIMED UP AND GO:  Was the test performed? No .  Phone visit.  Cognitive Function:    12/20/2016   11:27 AM 12/04/2014   10:11 AM  MMSE - Mini Mental State Exam  Orientation to time 5 5  Orientation to Place 5 5  Registration 3 3  Attention/ Calculation 5 5  Recall 3 3  Language- name 2 objects 2 2  Language- repeat 1 1  Language- follow 3 step command 3 3  Language- read & follow direction 1 1  Write a sentence 1 1  Copy design 1 1  Total score 30 30        08/12/2021   10:53 AM 07/27/2019    1:55 PM 07/17/2018    3:54 PM  6CIT Screen  What Year? 0 points 0 points 0 points  What month? 0 points 0 points 0 points  What time? 0 points 0 points 0 points  Count back from 20 0 points 0 points 0 points  Months in reverse 0 points 0 points 2 points  Repeat phrase 0 points 2 points 0 points  Total Score 0 points 2 points 2 points    Immunizations Immunization History  Administered Date(s) Administered   Fluad Quad(high Dose 65+) 11/15/2018, 01/26/2021   Influenza Whole 10/09/2008   Influenza, High Dose Seasonal PF 11/29/2016, 11/08/2017   Influenza,inj,Quad PF,6+ Mos 12/04/2013, 12/04/2014, 12/23/2015   Pneumococcal Conjugate-13 12/04/2013   Pneumococcal Polysaccharide-23 08/26/2011   Td 11/08/2017    TDAP status: Up to date  Flu  Vaccine status: Up to date  Pneumococcal vaccine status: Up to date  Covid-19 vaccine status: Declined, Education has been provided regarding the importance of this vaccine but patient still declined. Advised may receive this vaccine at local pharmacy or Health Dept.or vaccine clinic. Aware to provide a copy of the vaccination record if obtained from local pharmacy or Health Dept. Verbalized acceptance and understanding.  Qualifies for Shingles Vaccine? Yes   Zostavax completed No  Shingrix Completed?: No.    Education has been provided regarding the importance of this vaccine. Patient has been advised to call insurance company to determine out of pocket expense if they have not yet received this vaccine. Advised may also receive vaccine at local pharmacy or Health Dept. Verbalized acceptance and understanding.  Screening Tests Health Maintenance  Topic Date Due   COVID-19 Vaccine (1) 10/12/2021 (Originally 11/03/1945)   Zoster Vaccines- Shingrix (1 of 2) 11/06/2021 (Originally 05/02/1964)   OPHTHALMOLOGY EXAM  08/20/2021   INFLUENZA VACCINE  09/08/2021   HEMOGLOBIN A1C  10/28/2021   FOOT EXAM  01/26/2022   COLONOSCOPY (Pts 45-50yr Insurance coverage will need to be confirmed)  12/19/2024   TETANUS/TDAP  11/09/2027   Pneumonia Vaccine 75 Years old  Completed   Hepatitis C Screening  Completed   HPV VACCINES  Aged Out    Health Maintenance  There are no preventive care reminders to display for this patient.   Colorectal cancer screening: Type of screening: Colonoscopy. Completed 12/20/2019. Repeat every 5 years  Lung Cancer Screening: (Low Dose CT Chest recommended if Age 75-80years, 30 pack-year currently smoking OR have quit w/in 15years.) does not qualify.   Additional Screening:  Hepatitis C Screening: does qualify; Completed 08/21/2015  Vision Screening: Recommended annual ophthalmology exams for early detection of glaucoma and other disorders of the eye. Is the patient  up to date with their annual eye exam?  Yes  Who is the provider or what is the name of the office in which the patient attends annual eye exams? [facility] If pt is not established with a provider, would they like to be referred to a provider to establish care? No .   Dental Screening: Recommended annual dental exams for proper oral hygiene  Community Resource Referral / Chronic Care Management: CRR required this visit?  No   CCM required this visit?  No      Plan:     I have personally reviewed and noted the following in the patient's chart:   Medical and social history Use of alcohol, tobacco or illicit drugs  Current medications and supplements including opioid prescriptions. Patient is not currently taking opioid prescriptions. Functional ability and status Nutritional status Physical activity Advanced directives List of other physicians Hospitalizations, surgeries, and ER visits in previous 12 months Vitals Screenings to include cognitive, depression, and falls Referrals and appointments  In addition, I have reviewed and discussed with patient certain preventive protocols, quality metrics, and best practice recommendations. A written personalized care plan for preventive services as well as general preventive health recommendations were provided to patient.     MChriss Driver LPN   70/07/2692  Nurse Notes: Discussed Shingrix and how to obtain. Pt c/o issues with [employer]. Pt states he has an appointment with JAlmyra Freeon Friday to discuss assistnace.

## 2021-08-14 ENCOUNTER — Ambulatory Visit (INDEPENDENT_AMBULATORY_CARE_PROVIDER_SITE_OTHER): Payer: PPO | Admitting: Pharmacist

## 2021-08-14 DIAGNOSIS — E119 Type 2 diabetes mellitus without complications: Secondary | ICD-10-CM

## 2021-08-14 DIAGNOSIS — J449 Chronic obstructive pulmonary disease, unspecified: Secondary | ICD-10-CM

## 2021-08-14 DIAGNOSIS — N1832 Chronic kidney disease, stage 3b: Secondary | ICD-10-CM

## 2021-08-14 MED ORDER — BUDESONIDE-FORMOTEROL FUMARATE 160-4.5 MCG/ACT IN AERO: 2.0000 | INHALATION_SPRAY | Freq: Two times a day (BID) | RESPIRATORY_TRACT | 3 refills | Status: DC

## 2021-08-14 MED ORDER — ONETOUCH DELICA PLUS LANCET33G MISC: 12 refills | Status: DC

## 2021-08-14 MED ORDER — ONETOUCH VERIO FLEX SYSTEM W/DEVICE KIT: PACK | 0 refills | Status: AC

## 2021-08-14 MED ORDER — ONETOUCH VERIO VI STRP: ORAL_STRIP | 12 refills | Status: DC

## 2021-08-14 NOTE — Patient Instructions (Addendum)
Visit Information  Following are the goals we discussed today:  Current Barriers:  Unable to independently afford treatment regimen Unable to maintain control of T2DM  Pharmacist Clinical Goal(s):  Over the next 90 days, patient will verbalize ability to afford treatment regimen maintain control of T2DM as evidenced by IMPROVED GLYCEMIC CONTROL  through collaboration with PharmD and provider.   Interventions: 1:1 collaboration with [name], [name], MD regarding development and update of comprehensive plan of care as evidenced by provider attestation and co-signature Inter-disciplinary care team collaboration (see longitudinal plan of care) Comprehensive medication review performed; medication list updated in electronic medical record  Diabetes: Uncontrolled-A1C 7.9%; current treatment: FARXIGA 10MG DAILY, glimepiride Continue Farxiga to 58m DAILY--patient stable Escribed to [employer] [facility] [place] [facility] U[facility]IL 02/07/21.  AUTO RE-ENROLLMENT FOR 2023 WILL BEGIN AFTER 02/07/21 & LAST UNTIL 02/07/22. Discussed with patient about resuming ozempic low dose (if able to get) and d/c glimepiride; will discuss at PCP f/u next month Current glucose readings: fasting glucose <200, post prandial glucose: N/A Denies hypoglycemic/hyperglycemic symptoms Discussed meal planning options and Plate method for healthy eating Avoid sugary drinks and desserts Incorporate balanced protein, non starchy veggies, 1 serving of carbohydrate with each meal Increase water intake Increase physical activity as able Current exercise: UNABLE DUE TO CURRENT STATE--BLOOD PRESSURE LABILE; ENERGY LEVEL DECREASED Educated on medications-purpose & side effects Assessed patient finances. [facility]  They will no longer be servicing patient after this year and with certain medications. PLAN:  [facility]  Patient has enough Ozempic 0.256mto last until we can reapply in November 2022.  We will be ordering patient's Breztri via [employer] assistance program--call placed to [place] to add breztri inhaler to patient's medications, [facility]acy for [place] patient assistance)--RE-ENROLLED FOR 2023--MEDS TO SHIP TO PATIENT'S HOME.  Hypertension: Uncontrolled- BP goal <130/80; current treatment: bisoprolol, clonidine, hydralazine, isosorbide Current home readings:  traditional automatic cuff (reports labile from 100s-170s SBP) wrist cuff: 140/82, 123/71; PCP in office today 123/71 Reports hypotensive/hypertensive symptoms Counseled on taking all BP medications as prescribed and at the same time of day to keep steady control; BP is managed by cardiology, therefore patient instructed to call cards for any BP needs; patient to keep strict log of BP times and readings  Patient Goals/Self-Care Activities Over the next 90 days, patient will:  - take medications as prescribed check glucose DAILY (FASTING) OR IF SYMPTOMATIC , document, and provide at future appointments check blood pressure DAILY OR IF SYMPTOMATIC, document, and provide at future appointments  Follow Up Plan: Telephone follow up appointment with care management team member scheduled for: 1 month   Plan: Telephone follow up appointment with care management team member scheduled for:  12/2021  Signature JuRegina EckPharmD, BCPS Clinical Pharmacist, [facility] Phone [phone] Please call the care guide team at [phone] you need to cancel or reschedule your appointment.   The patient verbalized understanding of instructions, educational materials, and care plan provided today and DECLINED offer to receive copy of patient instructions, educational materials, and care plan.

## 2021-08-14 NOTE — Progress Notes (Signed)
Chronic Care Management Pharmacy Note  08/14/2021 Name:  Kenneth Fuller MRN:  [id number] DOB:  [birth year]  Summary:  Diabetes: Uncontrolled-A1C 7.9%; current treatment: FARXIGA 10MG DAILY, glimepiride Continue Farxiga to 43m DAILY--patient stable Escribed to [employer] Received notification from [place] regarding approval for [place] Patient assistance approved UNTIL 02/07/21.  AUTO RE-ENROLLMENT FOR 2023 WILL BEGIN AFTER 02/07/21 & LAST UNTIL 02/07/22. Discussed with patient about resuming ozempic low dose (if able to get) and d/c glimepiride; will discuss at PCP f/u next month Current glucose readings: fasting glucose <200, post prandial glucose: N/A Denies hypoglycemic/hyperglycemic symptoms Discussed meal planning options and Plate method for healthy eating Avoid sugary drinks and desserts Incorporate balanced protein, non starchy veggies, 1 serving of carbohydrate with each meal Increase water intake Increase physical activity as able Current exercise: UNABLE DUE TO CURRENT STATE--BLOOD PRESSURE LABILE; ENERGY LEVEL DECREASED Educated on medications-purpose & side effects Assessed patient finances. [facility]  They will no longer be servicing patient after this year and with certain medications. PLAN: [facility]  Patient has enough Ozempic 0.29mto last until we can reapply in November 2022.  We will be ordering patient's Breztri via [employer] assistance program--call placed to az&me to add breztri inhaler to patient's medications, [facility] (pharmacy for az&me patient assistance)--RE-ENROLLED FOR 2023--MEDS TO SHIP TO PATIENT'S HOME.  Hypertension: Uncontrolled- BP goal <130/80; current treatment: bisoprolol, clonidine, hydralazine, isosorbide Current home readings:  traditional automatic cuff (reports labile from 100s-170s SBP) wrist cuff:  140/82, 123/71; PCP in office today 123/71 Reports hypotensive/hypertensive symptoms Counseled on taking all BP medications as prescribed and at the same time of day to keep steady control; BP is managed by cardiology, therefore patient instructed to call cards for any BP needs; patient to keep strict log of BP times and readings  Patient Goals/Self-Care Activities Over the next 90 days, patient will:  - take medications as prescribed check glucose DAILY (FASTING) OR IF SYMPTOMATIC , document, and provide at future appointments check blood pressure DAILY OR IF SYMPTOMATIC, document, and provide at future appointments  Follow Up Plan: Telephone follow up appointment with care management team member scheduled for: 1 month   Subjective: Kenneth SOLLENBERGERs an [age].o. year old male who is a primary patient of [name], JoFransisca KaufmannMD.  The CCM team was consulted for assistance with disease management and care coordination needs.    Engaged with patient face to face for follow up visit in response to provider referral for pharmacy case management and/or care coordination services.   Consent to Services:  The patient was given information about Chronic Care Management services, agreed to services, and gave verbal consent prior to initiation of services.  Please see initial visit note for detailed documentation.   Patient Care Team: [name], JoFransisca KaufmannMD as PCP - General (Family Medicine) Croitoru, MiDani GobbleMD as PCP - Cardiology (Cardiology) LeHarlen LabsMD as Referring Physician (Optometry) FoShea EvansMiNorva RiffleLCSW as Social Worker (Licensed Clinical Social Worker) HuCori RazorKrDelice BisonRN as Case Manager Croitoru, MiDani GobbleMD as Consulting Physician (Cardiology) PrLavera GuiseRPNew [facility] Surgery [facility] LLCPharmacist) BhLiana GeroldMD as Consulting Physician (Nephrology) JaMilus BanisterMD as Attending Physician (Gastroenterology) AlRigoberto NoelMD as Consulting Physician (Pulmonary  Disease)   Objective:  Lab Results  Component Value Date   CREATININE 0.94 04/27/2021   CREATININE 1.02 01/26/2021  CREATININE 1.12 10/24/2020    Lab Results  Component Value Date   HGBA1C 7.3 (H) 04/27/2021   Last diabetic Eye exam:  Lab Results  Component Value Date/Time   HMDIABEYEEXA No Retinopathy 03/23/2018 12:00 AM    Last diabetic Foot exam: No results found for: "HMDIABFOOTEX"      Component Value Date/Time   CHOL 92 (L) 04/27/2021 1524   CHOL 111 06/12/2012 1232   TRIG 130 04/27/2021 1524   TRIG 141 01/24/2014 1143   TRIG 192 (H) 06/12/2012 1232   HDL 45 04/27/2021 1524   HDL 38 (L) 01/24/2014 1143   HDL 33 (L) 06/12/2012 1232   CHOLHDL 2.0 04/27/2021 1524   CHOLHDL 4.8 11/22/2006 1712   VLDL 39 11/22/2006 1712   LDLCALC 24 04/27/2021 1524   LDLCALC 27 10/25/2013 1224   LDLCALC 40 06/12/2012 1232       Latest Ref Rng & Units 04/27/2021    3:24 PM 01/26/2021    3:16 PM 10/17/2020    6:42 PM  Hepatic Function  Total Protein 6.0 - 8.5 g/dL 6.6  6.7  6.6   Albumin 3.7 - 4.7 g/dL 4.2  4.2  3.8   AST 0 - 40 IU/L _0 ALT 0 - 44 IU/L _1 Alk Phosphatase 44 - 121 IU/L 109  105  71   Total Bilirubin 0.0 - 1.2 mg/dL 0.3  0.4  0.6     Lab Results  Component Value Date/Time   TSH 3.30 02/25/2017 10:28 AM   TSH 2.300 08/21/2015 01:52 PM       Latest Ref Rng & Units 04/27/2021    3:24 PM 01/26/2021    3:16 PM 10/17/2020    6:42 PM  CBC  WBC 3.4 - 10.8 x10E3/uL 9.8  9.3  7.5   Hemoglobin 13.0 - 17.7 g/dL 14.6  13.9  14.0   Hematocrit 37.5 - 51.0 % 43.5  43.7  43.5   Platelets 150 - 450 x10E3/uL 249  242  211     No results found for: "VD25OH"  Clinical ASCVD: Yes  The ASCVD Risk score (Arnett DK, et al., 2019) failed to calculate for the following reasons:   The patient has a prior MI or stroke diagnosis    Other: (CHADS2VASc if Afib, PHQ9 if depression, MMRC or CAT for COPD, ACT, DEXA)  Social History   Tobacco Use  Smoking  Status Former   Packs/day: 1.00   Types: Cigarettes   Start date: 02/09/1964   Quit date: 02/08/1969   Years since quitting: 52.5  Smokeless Tobacco Former   Quit date: 1971   BP Readings from Last 3 Encounters:  07/01/21 (!) 146/86  04/27/21 130/74  04/22/21 136/76   Pulse Readings from Last 3 Encounters:  07/01/21 84  04/27/21 78  04/22/21 73   Wt Readings from Last 3 Encounters:  08/12/21 230 lb (104.3 kg)  07/01/21 240 lb (108.9 kg)  04/27/21 252 lb (114.3 kg)    Assessment: Review of patient past medical history, allergies, medications, health status, including review of consultants reports, laboratory and other test data, was performed as part of comprehensive evaluation and provision of chronic care management services.   SDOH:  (Social Determinants of Health) assessments and interventions performed:    CCM Care Plan  Allergies  Allergen Reactions   Amlodipine Besy-Benazepril Hcl Swelling and Other (See Comments)    Makes tongue swell (lotrel)   Hydrocodone  Itching    Can tolerate in low doses   Oxycodone Itching   Phenergan [Promethazine Hcl] Other (See Comments)    "I can't remember."    Medications Reviewed Today     Reviewed by [name], [facility] (Pharmacist) on 08/21/21 at 1150  Med List Status: <None>   Medication Order Taking? Sig Documenting Provider Last Dose Status Informant  albuterol (PROVENTIL) (2.5 MG/3ML) 0.083% nebulizer solution [id number]  USE 1 VIAL IN NEBULIZER EVERY 6 HOURS AS NEEDED FOR WHEEZING AND FOR SHORTNESS OF BREATH [name], [name], MD  Active   albuterol (VENTOLIN HFA) 108 (90 Base) MCG/ACT inhaler [id number]  Inhale 2 puffs into the lungs every 6 (six) hours as needed for wheezing or shortness of breath. Claretta Fraise, MD  Active Multiple Informants  bisoprolol (ZEBETA) 5 MG tablet [id number]  Take 1/2 (one-half) tablet by mouth once daily [name], [name], MD  Active   Blood Glucose Monitoring Suppl (ONETOUCH VERIO FLEX  SYSTEM) w/Device KIT [id number] Yes Use to test blood sugar daily as directed. DX: E11.65 [name], [name], MD  Active   budesonide-formoterol [facility]) 160-4.5 MCG/ACT inhaler [id number] Yes Inhale 2 puffs into the lungs 2 (two) times daily. [name], [name], MD  Active            Med Note Blanca Friend, Royce Macadamia   Fri Aug 21, 2021 11:49 AM) Via [place] patient assistance program   cetirizine (ZYRTEC) 10 MG tablet [id number]  Take 1 tablet (10 mg total) by mouth daily. Evelina Dun A, FNP  Active   Cholecalciferol (VITAMIN D3) 125 MCG (5000 UT) TABS [id number]  Take 5,000 Units by mouth daily. [provider]  Active Multiple Informants  cycloSPORINE (RESTASIS) 0.05 % ophthalmic emulsion [id number]  Apply to eye. [provider]  Active Multiple Informants  dapagliflozin propanediol (FARXIGA) 10 MG TABS tablet [id number]  Take 1 tablet (10 mg total) by mouth daily. [name], [name], MD  Active            Med Note Blanca Friend, Royce Macadamia   Fri Mar 27, 2021 11:33 AM) Via [place] patient assistance program   fluticasone (FLONASE) 50 MCG/ACT nasal spray [id number]  Place 2 sprays into both nostrils daily. Evelina Dun A, FNP  Active   furosemide (LASIX) 40 MG tablet [id number]  Take 40 mg by mouth daily as needed (fluid retention.). [provider]  Active Multiple Informants  glimepiride (AMARYL) 4 MG tablet [id number]  Take by mouth. [provider]  Active Multiple Informants  glucose blood (ONETOUCH VERIO) test strip [id number] Yes Use to test blood sugar daily as directed. DX: E11.65 [name], [name], MD  Active   hydrALAZINE (APRESOLINE) 25 MG tablet [id number]  TAKE 3 TABLETS BY MOUTH THREE TIMES DAILY Croitoru, Mihai, MD  Active   hydrALAZINE (APRESOLINE) 50 MG tablet [id number]  TAKE 1 TABLET BY MOUTH THREE TIMES DAILY Croitoru, Mihai, MD  Active   isosorbide dinitrate (ISORDIL) 20 MG tablet [id number]  Take 1 tablet (20 mg total) by mouth 3 (three) times daily.  [name], [name], MD  Active Multiple Informants  Lancets (ONETOUCH DELICA PLUS RTMYTR17B) [place] [id number] Yes Use to test blood sugar daily as directed. DX: E11.65 [name], [name], MD  Active   meclizine (ANTIVERT) 25 MG tablet [id number]  Take 1 tablet (25 mg total) by mouth 3 (three) times daily as needed for dizziness. [name], [name], MD  Active   mirabegron ER (MYRBETRIQ) 25 MG TB24 tablet [id number]  Take 1  tablet (25 mg total) by mouth daily. [name], [name], MD  Active   nitroGLYCERIN (NITROSTAT) 0.4 MG SL tablet [id number]  Place 1 tablet (0.4 mg total) under the tongue every 5 (five) minutes x 3 doses as needed for chest pain.  Patient not taking: Reported on 04/17/2021   Heath Lark D, DO  Expired 07/07/20 2359            Med Note Netta Neat, EBONY J   Fri Apr 17, 2021 10:33 AM) Patient has if needed  olmesartan (BENICAR) 20 MG tablet [id number]  Take 1 tablet (20 mg total) by mouth daily. Tanda Rockers, MD  Active   omeprazole (PRILOSEC) 40 MG capsule [id number]  TAKE 1 CAPSULE BY MOUTH ONCE DAILY 30-60  MINUTES  PRIOR  TO  Estrella Deeds, MD  Active   roflumilast (DALIRESP) 500 MCG TABS tablet [id number]  Take 1 tablet (500 mcg total) by mouth daily. [name], [name], MD  Active            Med Note Blanca Friend, Royce Macadamia   Fri Mar 27, 2021 11:33 AM) Via [place] patient assistance program   Semaglutide,0.25 or 0.5MG/DOS, (OZEMPIC, 0.25 OR 0.5 MG/DOSE,) 2 MG/3ML SOPN [id number] Yes Inject 0.5 mg into the skin once a week. [provider]  Active            Med Note Blanca Friend, Royce Macadamia   Fri Aug 14, 2021 10:56 AM) Via novo nordisk patient assistance program    sertraline (ZOLOFT) 100 MG tablet [id number]  Take 1 tablet (100 mg total) by mouth daily. [name], [name], MD  Active Multiple Informants  spironolactone (ALDACTONE) 25 MG tablet [id number]  Take by mouth. [provider]  Active   tamsulosin (FLOMAX) 0.4 MG CAPS capsule [id number]  TAKE 1 CAPSULE  BY MOUTH ONCE DAILY AFTER SUPPER [name], [name], MD  Active             Patient Active Problem List   Diagnosis Date Noted   Chronic respiratory failure with hypoxia ([place]) 10/20/2020   Insomnia    CHF (congestive heart failure) ([place]) 01/06/2018   Depression with anxiety 01/04/2018   CKD (chronic kidney disease), stage III ([place]) 01/04/2018   COPD GOLD 0/  AB 01/04/2018   Swelling of lower extremity 11/30/2016   Morbid obesity ([place]) 08/21/2015   Coronary artery disease involving native coronary artery of native heart without angina pectoris 12/05/2014   Urinary urgency 10/15/2014   Spinal stenosis, lumbar region, with neurogenic claudication 01/02/2014   Hyperlipidemia associated with type 2 diabetes mellitus ([place]) 10/25/2013   Hyperlipidemia, unspecified 10/25/2013   COLONIC POLYPS, ADENOMATOUS 02/25/2007   Diabetes mellitus type II, non insulin dependent ([place]) 02/25/2007   Gout 02/25/2007   Essential hypertension 02/25/2007   Cough variant asthma 02/25/2007   OSA on CPAP 02/25/2007   FATIGUE, CHRONIC 02/25/2007   HEADACHE, CHRONIC 02/25/2007   GERD (gastroesophageal reflux disease) 02/25/2007    Immunization History  Administered Date(s) Administered   Fluad Quad(high Dose 65+) 11/15/2018, 01/26/2021   Influenza Whole 10/09/2008   Influenza, High Dose Seasonal PF 11/29/2016, 11/08/2017   Influenza,inj,Quad PF,6+ Mos 12/04/2013, 12/04/2014, 12/23/2015   Pneumococcal Conjugate-13 12/04/2013   Pneumococcal Polysaccharide-23 08/26/2011   Td 11/08/2017    Conditions to be addressed/monitored: HLD, COPD, and DMII  Care Plan : PHARMD MEDICATION MANAGMENT  Updates made by [name], [place] since 08/25/2021 12:00 AM     Problem: DISEASE PREVENTION PROGRESSION  Long-Range Goal: T2DM, HTN   Recent Progress: Not on track  Priority: High  Note:   Current Barriers:  Unable to independently afford treatment regimen Unable to maintain control of  T2DM  Pharmacist Clinical Goal(s):  Over the next 90 days, patient will verbalize ability to afford treatment regimen maintain control of T2DM as evidenced by IMPROVED GLYCEMIC CONTROL  through collaboration with PharmD and provider.    Interventions: 1:1 collaboration with [name], [name], MD regarding development and update of comprehensive plan of care as evidenced by provider attestation and co-signature Inter-disciplinary care team collaboration (see longitudinal plan of care) Comprehensive medication review performed; medication list updated in electronic medical record  Diabetes: Uncontrolled-A1C 7.9-->7.3% (improved); current treatment: Ozempic 0.29m weekly; FARXIGA 10MG DAILY, glimepiride Continue Farxiga to 184mDAILY--patient stable Escribed to [employer] Received notification from [place]&ME [place] UNTIL 02/07/22 Patient has resumed Ozempic and is tolerating well; he has lost weight and his sugar remains controlled Will complete patient assistance Denies personal and family history of Medullary thyroid cancer (MTC) Current glucose readings: fasting glucose <200, post prandial glucose: N/A Denies hypoglycemic/hyperglycemic symptoms Discussed meal planning options and Plate method for healthy eating Avoid sugary drinks and desserts Incorporate balanced protein, non starchy veggies, 1 serving of carbohydrate with each meal Increase water intake Increase physical activity as able Current exercise: UNABLE DUE TO CURRENT STATE--BLOOD PRESSURE LABILE; ENERGY LEVEL DECREASED Educated on medications-purpose & side effects Assessed patient finances. [facility] be [facility] patient after this year and with certain medications. PLAN[facility] has enough Ozempic 0.259mo last until we can reapply in November 2022.  We  will be ordering patient's Breztri via [employer] assistance program--call placed to az&me to add breztri inhaler to patient's medications, [facility] (pharmacy for az&me patient assistance)--RE-ENROLLED FOR 2023--MEDS TO SHIP TO PATIENT'S HOME.  Hypertension: Uncontrolled- BP goal <130/80; current treatment: bisoprolol, clonidine, hydralazine, isosorbide Current home readings:  traditional automatic cuff (reports labile from 100s-170s SBP) wrist cuff: 140/82, 123/71; PCP in office today 123/71 Reports hypotensive/hypertensive symptoms Counseled on taking all BP medications as prescribed and at the same time of day to keep steady control; BP is managed by cardiology, therefore patient instructed to call cards for any BP needs; patient to keep strict log of BP times and readings  Patient Goals/Self-Care Activities Over the next 90 days, patient will:  - take medications as prescribed check glucose DAILY (FASTING) OR IF SYMPTOMATIC , document, and provide at future appointments check blood pressure DAILY OR IF SYMPTOMATIC, document, and provide at future appointments  Follow Up Plan: Telephone follow up appointment with care management team member scheduled for: 12/2021      Medication Assistance:  Symbicort, daliresp, farxiga obtained through az&me medication assistance program.  Enrollment ends 02/07/22 Ozempic obtained through novo nordisk medication assistance program.  Enrollment ends 01/07/22   Follow Up:  Patient agrees to Care Plan and Follow-up.  Plan: Telephone follow up appointment with care management team member scheduled for:  12/2021     JulRegina EckharmD, BCPS Clinical Pharmacist, [place] Phone [phone]

## 2021-08-25 DIAGNOSIS — I10 Essential (primary) hypertension: Secondary | ICD-10-CM | POA: Diagnosis not present

## 2021-08-25 DIAGNOSIS — E1169 Type 2 diabetes mellitus with other specified complication: Secondary | ICD-10-CM | POA: Diagnosis not present

## 2021-08-25 DIAGNOSIS — E1159 Type 2 diabetes mellitus with other circulatory complications: Secondary | ICD-10-CM | POA: Diagnosis not present

## 2021-08-25 DIAGNOSIS — Z87891 Personal history of nicotine dependence: Secondary | ICD-10-CM | POA: Diagnosis not present

## 2021-08-25 DIAGNOSIS — J449 Chronic obstructive pulmonary disease, unspecified: Secondary | ICD-10-CM | POA: Diagnosis not present

## 2021-08-25 DIAGNOSIS — Z6835 Body mass index (BMI) 35.0-35.9, adult: Secondary | ICD-10-CM | POA: Diagnosis not present

## 2021-08-29 DIAGNOSIS — J449 Chronic obstructive pulmonary disease, unspecified: Secondary | ICD-10-CM | POA: Diagnosis not present

## 2021-09-01 ENCOUNTER — Telehealth: Payer: Self-pay | Admitting: Family Medicine

## 2021-09-01 NOTE — Telephone Encounter (Signed)
Pts daughter called requesting to speak directly with Almyra Free regarding pts meds.

## 2021-09-03 NOTE — Telephone Encounter (Signed)
Daughter called back asking for Almyra Free. She hasn't got a call back yet. Please call daughter.

## 2021-09-04 NOTE — Telephone Encounter (Signed)
Please let daughter know I'm out of the office until 8/2 and see what she needs.  I've met and communicated with the patient multiple times this month And have been very backed up with patient assistance  All of patient's medications/needs were addressed at his last appt with me I'll check my inbox on Monday For urgent needs please route to provider They can schedule appt with me if needed 

## 2021-09-04 NOTE — Telephone Encounter (Signed)
Attempted to call pt , no answer left vm for cb

## 2021-09-07 ENCOUNTER — Other Ambulatory Visit: Payer: Self-pay | Admitting: Family Medicine

## 2021-09-07 DIAGNOSIS — J449 Chronic obstructive pulmonary disease, unspecified: Secondary | ICD-10-CM | POA: Diagnosis not present

## 2021-09-07 DIAGNOSIS — E119 Type 2 diabetes mellitus without complications: Secondary | ICD-10-CM

## 2021-09-07 DIAGNOSIS — I5032 Chronic diastolic (congestive) heart failure: Secondary | ICD-10-CM | POA: Diagnosis not present

## 2021-09-07 DIAGNOSIS — F339 Major depressive disorder, recurrent, unspecified: Secondary | ICD-10-CM

## 2021-09-07 DIAGNOSIS — R0902 Hypoxemia: Secondary | ICD-10-CM | POA: Diagnosis not present

## 2021-09-07 DIAGNOSIS — G4733 Obstructive sleep apnea (adult) (pediatric): Secondary | ICD-10-CM | POA: Diagnosis not present

## 2021-09-08 NOTE — Telephone Encounter (Signed)
Farxiga, Ozempic and an inhaler.  Received call from Health Dept. They are not able to cover his medication due to his [employer] having part D.  Would like an appt to discuss with Almyra Free.  Appt scheduled for 8/10 at 2pm by telephone.

## 2021-09-10 ENCOUNTER — Ambulatory Visit: Payer: Self-pay | Admitting: *Deleted

## 2021-09-10 NOTE — Chronic Care Management (AMB) (Signed)
  Chronic Care Management   Note  09/10/2021 Name: Kenneth Fuller MRN: [id number] DOB: [birth year]   Due to changes in the Chronic Care Management program, I am removing myself as the RN Care Manager from the Care Team and closing [place]. Patient was not scheduled to be followed by the RN Care Coordination nurse for [facility].   Patient has an open Care Plan with another CCM team member, Theadore Nan, LCSW and [name], PharmD.  I will forward this case closure encounter to the other team member(s). Patient does not have a current CCM referral placed since 06/08/21. CCM enrollment status changed to "not enrolled".   Patient's PCP can place a new referral if the they needs Care Management or Care Coordination services in the future.  [name], BSN, RN-BC Proofreader Dial: [phone]

## 2021-09-10 NOTE — Patient Instructions (Signed)
Kenneth Fuller  At some point during the past 4 years, I have worked with you through the [place] Management Program at [place].  Due to program changes I am removing myself from your care team.   If you are currently active with another CCM Team Member, you will remain active with them unless they reach out to you with additional information.   If you feel that you need services in the future,  please talk with your primary care provider and request a new referral for Care Management or Care Coordination services. This does not affect your status as a patient at [facility].   Thank you for allowing me to participate in your your healthcare journey.  [name], BSN, RN-BC Proofreader Dial: [phone]

## 2021-09-14 ENCOUNTER — Ambulatory Visit (INDEPENDENT_AMBULATORY_CARE_PROVIDER_SITE_OTHER): Payer: PPO

## 2021-09-14 ENCOUNTER — Ambulatory Visit (INDEPENDENT_AMBULATORY_CARE_PROVIDER_SITE_OTHER): Payer: PPO | Admitting: Family Medicine

## 2021-09-14 ENCOUNTER — Encounter: Payer: Self-pay | Admitting: Family Medicine

## 2021-09-14 VITALS — BP 145/84 | HR 74 | Temp 98.0°F | Ht 67.0 in | Wt 232.0 lb

## 2021-09-14 DIAGNOSIS — M19012 Primary osteoarthritis, left shoulder: Secondary | ICD-10-CM | POA: Diagnosis not present

## 2021-09-14 DIAGNOSIS — E785 Hyperlipidemia, unspecified: Secondary | ICD-10-CM

## 2021-09-14 DIAGNOSIS — I5042 Chronic combined systolic (congestive) and diastolic (congestive) heart failure: Secondary | ICD-10-CM | POA: Diagnosis not present

## 2021-09-14 DIAGNOSIS — M25512 Pain in left shoulder: Secondary | ICD-10-CM | POA: Diagnosis not present

## 2021-09-14 DIAGNOSIS — I10 Essential (primary) hypertension: Secondary | ICD-10-CM

## 2021-09-14 DIAGNOSIS — E1169 Type 2 diabetes mellitus with other specified complication: Secondary | ICD-10-CM

## 2021-09-14 DIAGNOSIS — E119 Type 2 diabetes mellitus without complications: Secondary | ICD-10-CM | POA: Diagnosis not present

## 2021-09-14 DIAGNOSIS — E782 Mixed hyperlipidemia: Secondary | ICD-10-CM

## 2021-09-14 DIAGNOSIS — N1832 Chronic kidney disease, stage 3b: Secondary | ICD-10-CM | POA: Diagnosis not present

## 2021-09-14 LAB — BAYER DCA HB A1C WAIVED: HB A1C (BAYER DCA - WAIVED): 5.5 % (ref 4.8–5.6)

## 2021-09-14 MED ORDER — BISOPROLOL FUMARATE 5 MG PO TABS: ORAL_TABLET | ORAL | 3 refills | Status: DC

## 2021-09-14 MED ORDER — ISOSORBIDE DINITRATE 20 MG PO TABS: 20.0000 mg | ORAL_TABLET | Freq: Three times a day (TID) | ORAL | 3 refills | Status: DC

## 2021-09-14 MED ORDER — HYDRALAZINE HCL 25 MG PO TABS: 75.0000 mg | ORAL_TABLET | Freq: Three times a day (TID) | ORAL | 3 refills | Status: DC

## 2021-09-14 MED ORDER — HYDRALAZINE HCL 50 MG PO TABS
50.0000 mg | ORAL_TABLET | Freq: Three times a day (TID) | ORAL | 3 refills | Status: DC
Start: 2021-09-14 — End: 2022-05-12

## 2021-09-14 NOTE — Progress Notes (Signed)
BP (!) 145/84   Pulse 74   Temp 98 F (36.7 C)   Ht _0  (1.702 m)   Wt 232 lb (105.2 kg)   SpO2 95%   BMI 36.34 kg/m    Subjective:   Patient ID: Kenneth Fuller, male    DOB: Dec 17, 1945, 76 y.o.   MRN: [id number]  HPI: Kenneth Fuller is a 76 y.o. male presenting on 09/14/2021 for Medical Management of Chronic Issues, Diabetes, Chronic Kidney Disease   HPI Type 2 diabetes mellitus Patient comes in today for recheck of his diabetes. Patient has been currently taking [place] and glimepiride and Ozempic, A1c is 5.5 and he feels like he is getting some low blood sugars. Patient is currently on an ACE inhibitor/ARB. Patient has not seen an ophthalmologist this year. Patient denies any issues with their feet. The symptom started onset as an adult hypertension and hyperlipidemia ARE RELATED TO DM   Hypertension Patient is currently on hydralazine and furosemide and isosorbide and olmesartan and spironolactone, and their blood pressure today is 145/84. Patient denies any lightheadedness or dizziness. Patient denies headaches, blurred vision, chest pains, shortness of breath, or weakness. Denies any side effects from medication and is content with current medication.   Hyperlipidemia Patient is coming in for recheck of his hyperlipidemia. The patient is currently taking no medicine currently, will try in the future again but for now he stable. They deny any issues with myalgias or history of liver damage from it. They deny any focal numbness or weakness or chest pain.   CHF and COPD Patient has CHF and COPD and feels like his lungs are doing so much better than they have ever done.  He says the [place] has made a world of difference for him.  Relevant past medical, surgical, family and social history reviewed and updated as indicated. Interim medical history since our last visit reviewed. Allergies and medications reviewed and updated.  Review of Systems  Constitutional:  Positive  for fatigue. Negative for chills and fever.  Respiratory:  Negative for shortness of breath and wheezing.   Cardiovascular:  Negative for chest pain and leg swelling.  Musculoskeletal:  Negative for back pain and gait problem.  Skin:  Negative for rash.  Neurological:  Negative for dizziness, weakness and light-headedness.  All other systems reviewed and are negative.   Per HPI unless specifically indicated above   Allergies as of 09/14/2021       Reactions   Amlodipine Besy-benazepril Hcl Swelling, Other (See Comments)   Makes tongue swell (lotrel)   Hydrocodone Itching   Can tolerate in low doses   Oxycodone Itching   Phenergan [promethazine Hcl] Other (See Comments)   "I can't remember."        Medication List        Accurate as of September 14, 2021  4:35 PM. If you have any questions, ask your nurse or doctor.          albuterol 108 (90 Base) MCG/ACT inhaler Commonly known as: VENTOLIN HFA Inhale 2 puffs into the lungs every 6 (six) hours as needed for wheezing or shortness of breath.   albuterol (2.5 MG/3ML) 0.083% nebulizer solution Commonly known as: PROVENTIL USE 1 VIAL IN NEBULIZER EVERY 6 HOURS AS NEEDED FOR WHEEZING AND FOR SHORTNESS OF BREATH   bisoprolol 5 MG tablet Commonly known as: ZEBETA Take 1/2 (one-half) tablet by mouth once daily   budesonide-formoterol 160-4.5 MCG/ACT inhaler Commonly known as: SYMBICORT Inhale 2 puffs  into the lungs 2 (two) times daily.   cetirizine 10 MG tablet Commonly known as: ZYRTEC Take 1 tablet (10 mg total) by mouth daily.   cycloSPORINE 0.05 % ophthalmic emulsion Commonly known as: RESTASIS Apply to eye.   dapagliflozin propanediol 10 MG Tabs tablet Commonly known as: Farxiga Take 1 tablet (10 mg total) by mouth daily.   fluticasone 50 MCG/ACT nasal spray Commonly known as: FLONASE Place 2 sprays into both nostrils daily.   furosemide 40 MG tablet Commonly known as: LASIX Take 40 mg by mouth daily as  needed (fluid retention.).   glimepiride 4 MG tablet Commonly known as: AMARYL Take by mouth.   hydrALAZINE 25 MG tablet Commonly known as: APRESOLINE Take 3 tablets (75 mg total) by mouth 3 (three) times daily.   hydrALAZINE 50 MG tablet Commonly known as: APRESOLINE Take 1 tablet (50 mg total) by mouth 3 (three) times daily.   isosorbide dinitrate 20 MG tablet Commonly known as: ISORDIL Take 1 tablet (20 mg total) by mouth 3 (three) times daily.   meclizine 25 MG tablet Commonly known as: ANTIVERT Take 1 tablet (25 mg total) by mouth 3 (three) times daily as needed for dizziness.   mirabegron ER 25 MG Tb24 tablet Commonly known as: Myrbetriq Take 1 tablet (25 mg total) by mouth daily.   nitroGLYCERIN 0.4 MG SL tablet Commonly known as: NITROSTAT Place 1 tablet (0.4 mg total) under the tongue every 5 (five) minutes x 3 doses as needed for chest pain.   olmesartan 20 MG tablet Commonly known as: Benicar Take 1 tablet (20 mg total) by mouth daily.   omeprazole 40 MG capsule Commonly known as: PRILOSEC TAKE 1 CAPSULE BY MOUTH ONCE DAILY 30-60  MINUTES  PRIOR  TO  BREAKFAST   OneTouch Delica Plus [id number] Misc Use to test blood sugar daily as directed. DX: E11.65   OneTouch Verio Flex System w/Device Kit Use to test blood sugar daily as directed. DX: E11.65   OneTouch Verio test strip Generic drug: glucose blood Use to test blood sugar daily as directed. DX: E11.65   Ozempic (0.25 or 0.5 MG/DOSE) 2 MG/3ML Sopn Generic drug: Semaglutide(0.25 or 0.5MG/DOS) Inject 0.5 mg into the skin once a week.   roflumilast 500 MCG Tabs tablet Commonly known as: DALIRESP Take 1 tablet (500 mcg total) by mouth daily.   sertraline 100 MG tablet Commonly known as: ZOLOFT Take 1 tablet by mouth once daily   spironolactone 25 MG tablet Commonly known as: ALDACTONE Take by mouth.   tamsulosin 0.4 MG Caps capsule Commonly known as: FLOMAX TAKE 1 CAPSULE BY MOUTH ONCE DAILY  AFTER SUPPER   Vitamin D3 125 MCG (5000 UT) Tabs Take 5,000 Units by mouth daily.         Objective:   BP (!) 145/84   Pulse 74   Temp 98 F (36.7 C)   Ht _0  (1.702 m)   Wt 232 lb (105.2 kg)   SpO2 95%   BMI 36.34 kg/m   Wt Readings from Last 3 Encounters:  09/14/21 232 lb (105.2 kg)  08/12/21 230 lb (104.3 kg)  07/01/21 240 lb (108.9 kg)    Physical Exam Vitals and nursing note reviewed.  Constitutional:      General: He is not in acute distress.    Appearance: He is well-developed. He is not diaphoretic.  Eyes:     General: No scleral icterus.    Conjunctiva/sclera: Conjunctivae normal.  Neck:  Thyroid: No thyromegaly.  Cardiovascular:     Rate and Rhythm: Normal rate and regular rhythm.     Heart sounds: Normal heart sounds. No murmur heard. Pulmonary:     Effort: Pulmonary effort is normal. No respiratory distress.     Breath sounds: Normal breath sounds. No wheezing.  Musculoskeletal:        General: Normal range of motion.     Cervical back: Neck supple.  Lymphadenopathy:     Cervical: No cervical adenopathy.  Skin:    General: Skin is warm and dry.     Findings: No rash.  Neurological:     Mental Status: He is alert and oriented to person, place, and time.     Coordination: Coordination normal.  Psychiatric:        Behavior: Behavior normal.     Left shoulder x-ray: No acute bony abnormality, await final read from radiology.  Assessment & Plan:   Problem List Items Addressed This Visit       Cardiovascular and Mediastinum   Essential hypertension   Relevant Medications   bisoprolol (ZEBETA) 5 MG tablet   hydrALAZINE (APRESOLINE) 25 MG tablet   hydrALAZINE (APRESOLINE) 50 MG tablet   isosorbide dinitrate (ISORDIL) 20 MG tablet   Other Relevant Orders   CBC with Differential/Platelet   CMP14+EGFR   Lipid panel   Bayer DCA Hb A1c Waived   CHF (congestive heart failure) (HCC)   Relevant Medications   bisoprolol (ZEBETA) 5 MG  tablet   hydrALAZINE (APRESOLINE) 25 MG tablet   hydrALAZINE (APRESOLINE) 50 MG tablet   isosorbide dinitrate (ISORDIL) 20 MG tablet     Endocrine   Diabetes mellitus type II, non insulin dependent (HCC)   Relevant Orders   CBC with Differential/Platelet   CMP14+EGFR   Lipid panel   Bayer DCA Hb A1c Waived   Hyperlipidemia associated with type 2 diabetes mellitus (HCC)   Relevant Medications   bisoprolol (ZEBETA) 5 MG tablet   hydrALAZINE (APRESOLINE) 25 MG tablet   hydrALAZINE (APRESOLINE) 50 MG tablet   isosorbide dinitrate (ISORDIL) 20 MG tablet   Other Relevant Orders   CBC with Differential/Platelet   CMP14+EGFR   Lipid panel   Bayer DCA Hb A1c Waived     Genitourinary   CKD (chronic kidney disease), stage III (HCC) - Primary   Relevant Orders   CBC with Differential/Platelet   CMP14+EGFR   Lipid panel   Bayer DCA Hb A1c Waived     Other   Hyperlipidemia, unspecified   Relevant Medications   bisoprolol (ZEBETA) 5 MG tablet   hydrALAZINE (APRESOLINE) 25 MG tablet   hydrALAZINE (APRESOLINE) 50 MG tablet   isosorbide dinitrate (ISORDIL) 20 MG tablet   Other Visit Diagnoses     Acute pain of left shoulder       Relevant Orders   DG Shoulder Left       Patient's only real complaint today is that he is having some fatigue and does feel like his blood sugars might be dipping down a little bit.  Only change in medication is we are going to discontinue the glimepiride but other than that he says he is feeling the best that he has felt in quite some time and has been out of the hospital longer than he has in a while. Follow up plan: Return in about 3 months (around 12/15/2021), or if symptoms worsen or fail to improve, for Hypertension and diabetes and hyperlipidemia.  Counseling provided for all of  the vaccine components Orders Placed This Encounter  Procedures   DG Shoulder Left   CBC with Differential/Platelet   CMP14+EGFR   Lipid panel   Bayer DCA Hb A1c  Waived    Caryl Pina, MD [facility] 09/14/2021, 4:35 PM

## 2021-09-15 LAB — CMP14+EGFR
ALT: 13 IU/L (ref 0–44)
AST: 13 IU/L (ref 0–40)
Albumin/Globulin Ratio: 1.8 (ref 1.2–2.2)
Albumin: 4.2 g/dL (ref 3.8–4.8)
Alkaline Phosphatase: 95 IU/L (ref 44–121)
BUN/Creatinine Ratio: 15 (ref 10–24)
BUN: 20 mg/dL (ref 8–27)
Bilirubin Total: 0.3 mg/dL (ref 0.0–1.2)
CO2: 24 mmol/L (ref 20–29)
Calcium: 9.4 mg/dL (ref 8.6–10.2)
Chloride: 100 mmol/L (ref 96–106)
Creatinine, Ser: 1.31 mg/dL — ABNORMAL HIGH (ref 0.76–1.27)
Globulin, Total: 2.3 g/dL (ref 1.5–4.5)
Glucose: 119 mg/dL — ABNORMAL HIGH (ref 70–99)
Potassium: 5 mmol/L (ref 3.5–5.2)
Sodium: 140 mmol/L (ref 134–144)
Total Protein: 6.5 g/dL (ref 6.0–8.5)
eGFR: 57 mL/min/1.73 — ABNORMAL LOW

## 2021-09-15 LAB — CBC WITH DIFFERENTIAL/PLATELET
Basophils Absolute: 0.1 10*3/uL (ref 0.0–0.2)
Basos: 1 %
EOS (ABSOLUTE): 0.2 10*3/uL (ref 0.0–0.4)
Eos: 3 %
Hematocrit: 43.9 % (ref 37.5–51.0)
Hemoglobin: 14.5 g/dL (ref 13.0–17.7)
Immature Grans (Abs): 0 10*3/uL (ref 0.0–0.1)
Immature Granulocytes: 0 %
Lymphocytes Absolute: 2.4 10*3/uL (ref 0.7–3.1)
Lymphs: 29 %
MCH: 25.8 pg — ABNORMAL LOW (ref 26.6–33.0)
MCHC: 33 g/dL (ref 31.5–35.7)
MCV: 78 fL — ABNORMAL LOW (ref 79–97)
Monocytes Absolute: 0.6 10*3/uL (ref 0.1–0.9)
Monocytes: 7 %
Neutrophils Absolute: 5.1 10*3/uL (ref 1.4–7.0)
Neutrophils: 60 %
Platelets: 226 10*3/uL (ref 150–450)
RBC: 5.61 x10E6/uL (ref 4.14–5.80)
RDW: 15.7 % — ABNORMAL HIGH (ref 11.6–15.4)
WBC: 8.4 10*3/uL (ref 3.4–10.8)

## 2021-09-15 LAB — LIPID PANEL
Chol/HDL Ratio: 3.6 ratio (ref 0.0–5.0)
Cholesterol, Total: 126 mg/dL (ref 100–199)
HDL: 35 mg/dL — ABNORMAL LOW
LDL Chol Calc (NIH): 64 mg/dL (ref 0–99)
Triglycerides: 155 mg/dL — ABNORMAL HIGH (ref 0–149)
VLDL Cholesterol Cal: 27 mg/dL (ref 5–40)

## 2021-09-17 ENCOUNTER — Ambulatory Visit (INDEPENDENT_AMBULATORY_CARE_PROVIDER_SITE_OTHER): Payer: PPO | Admitting: Pharmacist

## 2021-09-17 DIAGNOSIS — E1165 Type 2 diabetes mellitus with hyperglycemia: Secondary | ICD-10-CM | POA: Diagnosis not present

## 2021-09-25 ENCOUNTER — Encounter: Payer: Self-pay | Admitting: Pharmacist

## 2021-09-25 NOTE — Progress Notes (Signed)
  Chronic Care Management Pharmacy Note   09/17/2021 Name:  Kenneth Fuller    MRN:  [id number]      DOB:  [birth year]   Summary:   Diabetes: Uncontrolled-A1C 7.9%; current treatment: FARXIGA 10MG DAILY, glimepiride, Ozempic 0.31m weekly Continue Farxiga to 141mDAILY--patient stable Escribed to [employer] Received notification from [employer] regarding approval for FADestinPatient assistance approved UNTIL 02/07/22.   Continue Ozempic [id number] Denies personal and family history of Medullary thyroid cancer (MTC) Would like to d/c glimepiride Current glucose readings: fasting glucose <130 post prandial glucose: N/A Denies hypoglycemic/hyperglycemic symptoms Discussed meal planning options and Plate method for healthy eating Avoid sugary drinks and desserts Incorporate balanced protein, non starchy veggies, 1 serving of carbohydrate with each meal Increase water intake Increase physical activity as able Current exercise: UNABLE DUE TO CURRENT STATE--BLOOD PRESSURE LABILE; ENERGY LEVEL DECREASED Educated on medications-purpose & side effects Assessed patient finances. PLAN: [facility]'s Breztri via [employer] assistance program--call placed to az[employer]me to add breztri inhaler to patient's medications, [facility] for [facility] patient assistance)--RE-ENROLLED FOR 2023--MEDS TO SHIP TO PATIENT'S HOME.   Hypertension: Uncontrolled- BP goal <130/80; current treatment: bisoprolol, clonidine, hydralazine, isosorbide Current home readings:  traditional automatic cuff (reports labile from 100s-170s SBP) wrist cuff: 140/82, 123/71; PCP in office today 123/71 Reports hypotensive/hypertensive symptoms Counseled on taking all BP medications as prescribed and at the same time of day to keep steady control; BP is managed by cardiology, therefore patient instructed to call cards for any BP needs;  patient to keep strict log of BP times and readings   Patient Goals/Self-Care Activities Over the next 90 days, patient will:  - take medications as prescribed check glucose DAILY (FASTING) OR IF SYMPTOMATIC , document, and provide at future appointments check blood pressure DAILY OR IF SYMPTOMATIC, document, and provide at future appointments   Follow Up Plan: Telephone follow up appointment with care management team member scheduled for: 1 month      JulRegina EckharmD, BCPS Clinical Pharmacist, [facility] Phone [phone]

## 2021-09-29 DIAGNOSIS — J449 Chronic obstructive pulmonary disease, unspecified: Secondary | ICD-10-CM | POA: Diagnosis not present

## 2021-10-02 ENCOUNTER — Other Ambulatory Visit: Payer: PPO

## 2021-10-02 DIAGNOSIS — E1122 Type 2 diabetes mellitus with diabetic chronic kidney disease: Secondary | ICD-10-CM | POA: Diagnosis not present

## 2021-10-02 DIAGNOSIS — E1129 Type 2 diabetes mellitus with other diabetic kidney complication: Secondary | ICD-10-CM | POA: Diagnosis not present

## 2021-10-02 DIAGNOSIS — R809 Proteinuria, unspecified: Secondary | ICD-10-CM | POA: Diagnosis not present

## 2021-10-02 DIAGNOSIS — I5042 Chronic combined systolic (congestive) and diastolic (congestive) heart failure: Secondary | ICD-10-CM | POA: Diagnosis not present

## 2021-10-02 DIAGNOSIS — I129 Hypertensive chronic kidney disease with stage 1 through stage 4 chronic kidney disease, or unspecified chronic kidney disease: Secondary | ICD-10-CM | POA: Diagnosis not present

## 2021-10-02 DIAGNOSIS — N189 Chronic kidney disease, unspecified: Secondary | ICD-10-CM | POA: Diagnosis not present

## 2021-10-08 DIAGNOSIS — R809 Proteinuria, unspecified: Secondary | ICD-10-CM | POA: Diagnosis not present

## 2021-10-08 DIAGNOSIS — I5042 Chronic combined systolic (congestive) and diastolic (congestive) heart failure: Secondary | ICD-10-CM | POA: Diagnosis not present

## 2021-10-08 DIAGNOSIS — E1129 Type 2 diabetes mellitus with other diabetic kidney complication: Secondary | ICD-10-CM | POA: Diagnosis not present

## 2021-10-08 DIAGNOSIS — E1122 Type 2 diabetes mellitus with diabetic chronic kidney disease: Secondary | ICD-10-CM | POA: Diagnosis not present

## 2021-10-08 DIAGNOSIS — G4733 Obstructive sleep apnea (adult) (pediatric): Secondary | ICD-10-CM | POA: Diagnosis not present

## 2021-10-08 DIAGNOSIS — R0902 Hypoxemia: Secondary | ICD-10-CM | POA: Diagnosis not present

## 2021-10-08 DIAGNOSIS — R718 Other abnormality of red blood cells: Secondary | ICD-10-CM | POA: Diagnosis not present

## 2021-10-08 DIAGNOSIS — I5032 Chronic diastolic (congestive) heart failure: Secondary | ICD-10-CM | POA: Diagnosis not present

## 2021-10-08 DIAGNOSIS — E559 Vitamin D deficiency, unspecified: Secondary | ICD-10-CM | POA: Diagnosis not present

## 2021-10-08 DIAGNOSIS — N189 Chronic kidney disease, unspecified: Secondary | ICD-10-CM | POA: Diagnosis not present

## 2021-10-08 DIAGNOSIS — J449 Chronic obstructive pulmonary disease, unspecified: Secondary | ICD-10-CM | POA: Diagnosis not present

## 2021-10-08 DIAGNOSIS — I129 Hypertensive chronic kidney disease with stage 1 through stage 4 chronic kidney disease, or unspecified chronic kidney disease: Secondary | ICD-10-CM | POA: Diagnosis not present

## 2021-10-13 DIAGNOSIS — Z87891 Personal history of nicotine dependence: Secondary | ICD-10-CM | POA: Diagnosis not present

## 2021-10-13 DIAGNOSIS — I25118 Atherosclerotic heart disease of native coronary artery with other forms of angina pectoris: Secondary | ICD-10-CM | POA: Diagnosis not present

## 2021-10-13 DIAGNOSIS — Z955 Presence of coronary angioplasty implant and graft: Secondary | ICD-10-CM | POA: Diagnosis not present

## 2021-10-13 DIAGNOSIS — I252 Old myocardial infarction: Secondary | ICD-10-CM | POA: Diagnosis not present

## 2021-10-26 ENCOUNTER — Other Ambulatory Visit: Payer: Self-pay | Admitting: Family Medicine

## 2021-10-26 DIAGNOSIS — R3915 Urgency of urination: Secondary | ICD-10-CM

## 2021-10-26 DIAGNOSIS — R350 Frequency of micturition: Secondary | ICD-10-CM

## 2021-10-30 ENCOUNTER — Ambulatory Visit: Payer: PPO | Admitting: Pulmonary Disease

## 2021-10-30 ENCOUNTER — Encounter: Payer: Self-pay | Admitting: Pulmonary Disease

## 2021-10-30 VITALS — BP 136/82 | HR 78 | Temp 98.2°F | Ht 67.0 in | Wt 228.4 lb

## 2021-10-30 DIAGNOSIS — G4733 Obstructive sleep apnea (adult) (pediatric): Secondary | ICD-10-CM | POA: Diagnosis not present

## 2021-10-30 DIAGNOSIS — Z23 Encounter for immunization: Secondary | ICD-10-CM | POA: Diagnosis not present

## 2021-10-30 DIAGNOSIS — J449 Chronic obstructive pulmonary disease, unspecified: Secondary | ICD-10-CM

## 2021-10-30 DIAGNOSIS — Z9989 Dependence on other enabling machines and devices: Secondary | ICD-10-CM | POA: Diagnosis not present

## 2021-10-30 NOTE — Assessment & Plan Note (Addendum)
Favor predominant asthma He is smoked less than 10 pack years and doubt that he has COPD I explained that Daliresp has limited role but he really feels that this has helped him subjectively so we will continue at this time. Explained that [name] is the main medication here -he is getting this directly from the company so can continue, when that runs out we will try to stepdown  Flu shot today

## 2021-10-30 NOTE — Progress Notes (Signed)
   Subjective:    Patient ID: Kenneth Fuller, male    DOB: 1945-12-09, 76 y.o.   MRN: [id number]  HPI  76 yo remote smoker for FU of asthma/ COPD and OSA Initial visit 08/2020, He has previously seen Dr. Chase Caller and other providers at [facility] location -Doubt he has significant COPD.  Variability in lung function seems to suggest asthma   PMH - chronic diastolic heart failure coronary artery disease diabetes, stage3 CKD -asthma as a child , failed medical exam , recurred 2018          He smoked less than 10 pack years before he quit in 1971.  He reports lifelong history of asthma.  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   He had repeated exacerbations and episode of cough and congestion in February.  His wife was also sick. After his last visit in February, we gave him a longer course of prednisone.  He also got back on [place] Complaint  Patient presents with   Follow-up    Feels breathing is doing well since last ov    Last OV - could not tolerate biPAP referred to ENT Continue Breztri & daliresp  72-monthfollow-up visit He is breathing is doing well, he is compliant with BJudithann Saugerand Daliresp that he is getting from the company directly.  He feels Daliresp really helped him.  He did not keep appointment with ENT, weight is down to 225 pounds, BMI is 35.7 He complains of sweating in his sleep, reports nonrefreshing sleep and tired and sleepy in the daytime    Significant tests/ events reviewed  NPSG 06/2020 >> BiPAP 15/11 ,med FF mask 11/2015 NPSG  very severe OSA, AHI >100. Split night study showed optimal control with CPAP 5-20      PFTs 01/2021 no airway obstruction, moderate restriction ratio 74, FEV1 55%, FVC 53%, 12% bronchodilator response, FEV1 improved to 62%, DLCO 81%     12/2016 PFT >> ratio 84, severe restriction with reduced diffusion capacity.  Total lung capacity 72% FVC, 1.6 L / 40% and DLCO of  19.4/68%.   PFT 1/ 2019 showed FEV1 45%, ratio 68, FVC 48% consistent with moderate to severe airflow obstruction   Feno 01/26/2017 ->  8ppb     High resolution CT chest 01/2017 neg for ILD   CT angiogram chest 05/2020 clear lungs CT sinus March 09, 2017 clear sinuses    Review of Systems neg for any significant sore throat, dysphagia, itching, sneezing, nasal congestion or excess/ purulent secretions, fever, chills, sweats, unintended wt loss, pleuritic or exertional cp, hempoptysis, orthopnea pnd or change in chronic leg swelling. Also denies presyncope, palpitations, heartburn, abdominal pain, nausea, vomiting, diarrhea or change in bowel or urinary habits, dysuria,hematuria, rash, arthralgias, visual complaints, headache, numbness weakness or ataxia.     Objective:   Physical Exam  Gen. Pleasant, obese, in no distress ENT - no lesions, no post nasal drip Neck: No JVD, no thyromegaly, no carotid bruits Lungs: no use of accessory muscles, no dullness to percussion, decreased without rales or rhonchi  Cardiovascular: Rhythm regular, heart sounds  normal, no murmurs or gallops, no peripheral edema Musculoskeletal: No deformities, no cyanosis or clubbing , no tremors        Assessment & Plan:

## 2021-10-30 NOTE — Assessment & Plan Note (Signed)
Unable to tolerate CPAP or BiPAP. We will refer him to ENT for hypoglossal nerve stimulation therapy -we discussed pros and cons in great detail. We will repeat home sleep test in anticipation

## 2021-10-30 NOTE — Patient Instructions (Addendum)
  X refer to ENT for implant  X Schedule HST

## 2021-11-07 DIAGNOSIS — R0902 Hypoxemia: Secondary | ICD-10-CM | POA: Diagnosis not present

## 2021-11-07 DIAGNOSIS — J449 Chronic obstructive pulmonary disease, unspecified: Secondary | ICD-10-CM | POA: Diagnosis not present

## 2021-11-07 DIAGNOSIS — I5032 Chronic diastolic (congestive) heart failure: Secondary | ICD-10-CM | POA: Diagnosis not present

## 2021-11-07 DIAGNOSIS — G4733 Obstructive sleep apnea (adult) (pediatric): Secondary | ICD-10-CM | POA: Diagnosis not present

## 2021-11-11 ENCOUNTER — Telehealth: Payer: Self-pay | Admitting: Pharmacist

## 2021-11-11 NOTE — Telephone Encounter (Signed)
CALLED PATIENT TO INFORM PATIENT OF OZEMPIC  #4 BOXES  IN [place]

## 2021-11-19 ENCOUNTER — Ambulatory Visit (INDEPENDENT_AMBULATORY_CARE_PROVIDER_SITE_OTHER): Payer: PPO | Admitting: Family Medicine

## 2021-11-19 ENCOUNTER — Encounter: Payer: Self-pay | Admitting: Family Medicine

## 2021-11-19 VITALS — BP 139/85 | HR 70 | Temp 98.3°F | Ht 67.0 in | Wt 228.0 lb

## 2021-11-19 DIAGNOSIS — G4452 New daily persistent headache (NDPH): Secondary | ICD-10-CM

## 2021-11-19 LAB — URINALYSIS
Bilirubin, UA: NEGATIVE
Ketones, UA: NEGATIVE
Leukocytes,UA: NEGATIVE
Nitrite, UA: NEGATIVE
RBC, UA: NEGATIVE
Specific Gravity, UA: 1.025 (ref 1.005–1.030)
Urobilinogen, Ur: 0.2 mg/dL (ref 0.2–1.0)
pH, UA: 5.5 (ref 5.0–7.5)

## 2021-11-19 NOTE — Progress Notes (Signed)
BP 139/85   Pulse 70   Temp 98.3 F (36.8 C)   Ht _0  (1.702 m)   Wt 228 lb (103.4 kg)   SpO2 95%   BMI 35.71 kg/m    Subjective:   Patient ID: Kenneth Fuller, male    DOB: 12/09/1945, 76 y.o.   MRN: [id number]  HPI: Kenneth Fuller is a 76 y.o. male presenting on 11/19/2021 for Headache (On and off for two weeks/ vision is blurry. Denies chest pain, nausea, sweating. Pain in center of forehead. C/O enlarged blood vessel left scalp. BP in 158's at home- infrequently checks)   HPI Patient comes in describing pain that is in the front of his headache along with blurred vision.  He has been checking his blood pressure and it has not been elevated and his blood pressure today is 131/85.  He says it comes in waves but has been happening multiple times a day over the past couple weeks that he has noticed sometimes he will have the swollen artery on the left side of his head near the temple region.  The swelling artery comes up and down.  He denies any shortness of breath cough or sinus drainage except for his usual chronic cough.  Relevant past medical, surgical, family and social history reviewed and updated as indicated. Interim medical history since our last visit reviewed. Allergies and medications reviewed and updated.  Review of Systems  Constitutional:  Negative for chills and fever.  Eyes:  Positive for visual disturbance. Negative for pain, discharge and redness.  Respiratory:  Negative for shortness of breath and wheezing.   Cardiovascular:  Negative for chest pain and leg swelling.  Skin:  Negative for rash.  Neurological:  Positive for headaches. Negative for dizziness, facial asymmetry, speech difficulty, weakness, light-headedness and numbness.  All other systems reviewed and are negative.   Per HPI unless specifically indicated above   Allergies as of 11/19/2021       Reactions   Amlodipine Besy-benazepril Hcl Swelling, Other (See Comments)   Makes tongue  swell (lotrel)   Hydrocodone Itching   Can tolerate in low doses   Oxycodone Itching   Phenergan [promethazine Hcl] Other (See Comments)   "I can't remember."        Medication List        Accurate as of November 19, 2021 10:02 AM. If you have any questions, ask your nurse or doctor.          albuterol 108 (90 Base) MCG/ACT inhaler Commonly known as: VENTOLIN HFA Inhale 2 puffs into the lungs every 6 (six) hours as needed for wheezing or shortness of breath.   albuterol (2.5 MG/3ML) 0.083% nebulizer solution Commonly known as: PROVENTIL USE 1 VIAL IN NEBULIZER EVERY 6 HOURS AS NEEDED FOR WHEEZING AND FOR SHORTNESS OF BREATH   bisoprolol 5 MG tablet Commonly known as: ZEBETA Take 1/2 (one-half) tablet by mouth once daily   cetirizine 10 MG tablet Commonly known as: ZYRTEC Take 1 tablet (10 mg total) by mouth daily.   cycloSPORINE 0.05 % ophthalmic emulsion Commonly known as: RESTASIS Apply to eye.   dapagliflozin propanediol 10 MG Tabs tablet Commonly known as: Farxiga Take 1 tablet (10 mg total) by mouth daily.   fluticasone 50 MCG/ACT nasal spray Commonly known as: FLONASE Place 2 sprays into both nostrils daily.   furosemide 40 MG tablet Commonly known as: LASIX Take 40 mg by mouth daily as needed (fluid retention.).   glimepiride 4  MG tablet Commonly known as: AMARYL Take by mouth.   hydrALAZINE 25 MG tablet Commonly known as: APRESOLINE Take 3 tablets (75 mg total) by mouth 3 (three) times daily.   hydrALAZINE 50 MG tablet Commonly known as: APRESOLINE Take 1 tablet (50 mg total) by mouth 3 (three) times daily.   isosorbide dinitrate 20 MG tablet Commonly known as: ISORDIL Take 1 tablet (20 mg total) by mouth 3 (three) times daily.   meclizine 25 MG tablet Commonly known as: ANTIVERT Take 1 tablet (25 mg total) by mouth 3 (three) times daily as needed for dizziness.   mirabegron ER 25 MG Tb24 tablet Commonly known as: Myrbetriq Take 1  tablet (25 mg total) by mouth daily.   nitroGLYCERIN 0.4 MG SL tablet Commonly known as: NITROSTAT Place 1 tablet (0.4 mg total) under the tongue every 5 (five) minutes x 3 doses as needed for chest pain.   olmesartan 20 MG tablet Commonly known as: Benicar Take 1 tablet (20 mg total) by mouth daily.   omeprazole 40 MG capsule Commonly known as: PRILOSEC TAKE 1 CAPSULE BY MOUTH ONCE DAILY 30-60  MINUTES  PRIOR  TO  BREAKFAST   OneTouch Delica Plus [id number] Misc Use to test blood sugar daily as directed. DX: E11.65   OneTouch Verio Flex System w/Device Kit Use to test blood sugar daily as directed. DX: E11.65   OneTouch Verio test strip Generic drug: glucose blood Use to test blood sugar daily as directed. DX: E11.65   Ozempic (0.25 or 0.5 MG/DOSE) 2 MG/3ML Sopn Generic drug: Semaglutide(0.25 or 0.5MG/DOS) Inject 0.5 mg into the skin once a week.   roflumilast 500 MCG Tabs tablet Commonly known as: DALIRESP Take 1 tablet (500 mcg total) by mouth daily.   sertraline 100 MG tablet Commonly known as: ZOLOFT Take 1 tablet by mouth once daily   spironolactone 25 MG tablet Commonly known as: ALDACTONE Take by mouth.   tamsulosin 0.4 MG Caps capsule Commonly known as: FLOMAX TAKE 1 CAPSULE BY MOUTH ONCE DAILY AFTER SUPPER   Vitamin D3 125 MCG (5000 UT) Tabs Take 5,000 Units by mouth daily.         Objective:   BP 139/85   Pulse 70   Temp 98.3 F (36.8 C)   Ht 5' 7" (1.702 m)   Wt 228 lb (103.4 kg)   SpO2 95%   BMI 35.71 kg/m   Wt Readings from Last 3 Encounters:  11/19/21 228 lb (103.4 kg)  10/30/21 228 lb 6.4 oz (103.6 kg)  09/14/21 232 lb (105.2 kg)    Physical Exam Vitals and nursing note reviewed.  Constitutional:      General: He is not in acute distress.    Appearance: He is well-developed. He is not diaphoretic.  Eyes:     General: No scleral icterus.    Extraocular Movements: Extraocular movements intact.     Conjunctiva/sclera:  Conjunctivae normal.     Pupils: Pupils are equal, round, and reactive to light.  Neck:     Thyroid: No thyromegaly.  Cardiovascular:     Rate and Rhythm: Normal rate and regular rhythm.     Heart sounds: Normal heart sounds. No murmur heard. Pulmonary:     Effort: Pulmonary effort is normal. No respiratory distress.     Breath sounds: Normal breath sounds. No wheezing.  Musculoskeletal:        General: Normal range of motion.     Cervical back: Neck supple.  Lymphadenopathy:  Cervical: No cervical adenopathy.  Skin:    General: Skin is warm and dry.     Findings: No rash.  Neurological:     Mental Status: He is alert and oriented to person, place, and time.     Coordination: Coordination normal.  Psychiatric:        Behavior: Behavior normal.     Assessment & Plan:   Problem List Items Addressed This Visit   None Visit Diagnoses     New daily persistent headache    -  Primary   Relevant Orders   CBC with Differential/Platelet   CMP14+EGFR   C-reactive protein   Sedimentation rate   Serum protein electrophoresis with reflex   VITAMIN D 25 Hydroxy (Vit-D Deficiency, Fractures)   Phosphorus   Urinalysis       Symptoms could fit the onset of giant cell arteritis, will do some blood work and testing today consider neurology referral or biopsy depending on the lab results. Follow up plan: Return if symptoms worsen or fail to improve.  Counseling provided for all of the vaccine components Orders Placed This Encounter  Procedures   CBC with Differential/Platelet   CMP14+EGFR   C-reactive protein   Sedimentation rate   Serum protein electrophoresis with reflex   VITAMIN D 25 Hydroxy (Vit-D Deficiency, Fractures)   Phosphorus   Urinalysis    Caryl Pina, MD [facility] 11/19/2021, 10:02 AM

## 2021-11-23 LAB — PROTEIN ELECTROPHORESIS, SERUM, WITH REFLEX
A/G Ratio: 1.5 (ref 0.7–1.7)
Albumin ELP: 4 g/dL (ref 2.9–4.4)
Alpha 1: 0.2 g/dL (ref 0.0–0.4)
Alpha 2: 0.8 g/dL (ref 0.4–1.0)
Beta: 0.9 g/dL (ref 0.7–1.3)
Gamma Globulin: 0.9 g/dL (ref 0.4–1.8)
Globulin, Total: 2.7 g/dL (ref 2.2–3.9)

## 2021-11-23 LAB — VITAMIN D 25 HYDROXY (VIT D DEFICIENCY, FRACTURES): Vit D, 25-Hydroxy: 38.9 ng/mL (ref 30.0–100.0)

## 2021-11-23 LAB — CBC WITH DIFFERENTIAL/PLATELET
Basophils Absolute: 0.1 10*3/uL (ref 0.0–0.2)
Basos: 1 %
EOS (ABSOLUTE): 0.2 10*3/uL (ref 0.0–0.4)
Eos: 2 %
Hematocrit: 47.5 % (ref 37.5–51.0)
Hemoglobin: 15.5 g/dL (ref 13.0–17.7)
Immature Grans (Abs): 0 10*3/uL (ref 0.0–0.1)
Immature Granulocytes: 0 %
Lymphocytes Absolute: 2.1 10*3/uL (ref 0.7–3.1)
Lymphs: 28 %
MCH: 26 pg — ABNORMAL LOW (ref 26.6–33.0)
MCHC: 32.6 g/dL (ref 31.5–35.7)
MCV: 80 fL (ref 79–97)
Monocytes Absolute: 0.6 10*3/uL (ref 0.1–0.9)
Monocytes: 8 %
Neutrophils Absolute: 4.5 10*3/uL (ref 1.4–7.0)
Neutrophils: 61 %
Platelets: 238 10*3/uL (ref 150–450)
RBC: 5.96 x10E6/uL — ABNORMAL HIGH (ref 4.14–5.80)
RDW: 17.2 % — ABNORMAL HIGH (ref 11.6–15.4)
WBC: 7.4 10*3/uL (ref 3.4–10.8)

## 2021-11-23 LAB — CMP14+EGFR
ALT: 17 IU/L (ref 0–44)
AST: 19 IU/L (ref 0–40)
Albumin/Globulin Ratio: 1.9 (ref 1.2–2.2)
Albumin: 4.4 g/dL (ref 3.8–4.8)
Alkaline Phosphatase: 96 IU/L (ref 44–121)
BUN/Creatinine Ratio: 18 (ref 10–24)
BUN: 22 mg/dL (ref 8–27)
Bilirubin Total: 0.4 mg/dL (ref 0.0–1.2)
CO2: 27 mmol/L (ref 20–29)
Calcium: 9.5 mg/dL (ref 8.6–10.2)
Chloride: 102 mmol/L (ref 96–106)
Creatinine, Ser: 1.21 mg/dL (ref 0.76–1.27)
Globulin, Total: 2.3 g/dL (ref 1.5–4.5)
Glucose: 115 mg/dL — ABNORMAL HIGH (ref 70–99)
Potassium: 4.6 mmol/L (ref 3.5–5.2)
Sodium: 141 mmol/L (ref 134–144)
Total Protein: 6.7 g/dL (ref 6.0–8.5)
eGFR: 62 mL/min/{1.73_m2} (ref >59)

## 2021-11-23 LAB — PHOSPHORUS: Phosphorus: 3.9 mg/dL (ref 2.8–4.1)

## 2021-11-23 LAB — C-REACTIVE PROTEIN: CRP: 6 mg/L (ref 0–10)

## 2021-11-23 LAB — SEDIMENTATION RATE: Sed Rate: 15 mm/hr (ref 0–30)

## 2021-11-25 DIAGNOSIS — N1831 Chronic kidney disease, stage 3a: Secondary | ICD-10-CM | POA: Diagnosis not present

## 2021-11-25 DIAGNOSIS — E1122 Type 2 diabetes mellitus with diabetic chronic kidney disease: Secondary | ICD-10-CM | POA: Diagnosis not present

## 2021-11-25 DIAGNOSIS — I10 Essential (primary) hypertension: Secondary | ICD-10-CM | POA: Diagnosis not present

## 2021-11-25 DIAGNOSIS — I700 Atherosclerosis of aorta: Secondary | ICD-10-CM | POA: Diagnosis not present

## 2021-11-25 DIAGNOSIS — Z7984 Long term (current) use of oral hypoglycemic drugs: Secondary | ICD-10-CM | POA: Diagnosis not present

## 2021-11-25 DIAGNOSIS — Z87891 Personal history of nicotine dependence: Secondary | ICD-10-CM | POA: Diagnosis not present

## 2021-11-25 DIAGNOSIS — Z7985 Long-term (current) use of injectable non-insulin antidiabetic drugs: Secondary | ICD-10-CM | POA: Diagnosis not present

## 2021-11-29 DIAGNOSIS — I5032 Chronic diastolic (congestive) heart failure: Secondary | ICD-10-CM | POA: Diagnosis not present

## 2021-11-29 DIAGNOSIS — J449 Chronic obstructive pulmonary disease, unspecified: Secondary | ICD-10-CM | POA: Diagnosis not present

## 2021-11-29 DIAGNOSIS — R0902 Hypoxemia: Secondary | ICD-10-CM | POA: Diagnosis not present

## 2021-11-29 DIAGNOSIS — G4733 Obstructive sleep apnea (adult) (pediatric): Secondary | ICD-10-CM | POA: Diagnosis not present

## 2021-11-30 ENCOUNTER — Encounter (HOSPITAL_COMMUNITY): Payer: Self-pay | Admitting: *Deleted

## 2021-11-30 ENCOUNTER — Emergency Department (HOSPITAL_COMMUNITY): Payer: PPO

## 2021-11-30 ENCOUNTER — Emergency Department (HOSPITAL_COMMUNITY)
Admission: EM | Admit: 2021-11-30 | Discharge: 2021-12-01 | Disposition: A | Discharge status: Home / Self Care | Payer: PPO | Attending: Emergency Medicine | Admitting: Emergency Medicine

## 2021-11-30 ENCOUNTER — Other Ambulatory Visit: Payer: Self-pay

## 2021-11-30 DIAGNOSIS — I10 Essential (primary) hypertension: Secondary | ICD-10-CM | POA: Insufficient documentation

## 2021-11-30 DIAGNOSIS — R4182 Altered mental status, unspecified: Secondary | ICD-10-CM | POA: Diagnosis not present

## 2021-11-30 DIAGNOSIS — Z79899 Other long term (current) drug therapy: Secondary | ICD-10-CM | POA: Insufficient documentation

## 2021-11-30 DIAGNOSIS — R519 Headache, unspecified: Secondary | ICD-10-CM | POA: Diagnosis not present

## 2021-11-30 DIAGNOSIS — R079 Chest pain, unspecified: Secondary | ICD-10-CM | POA: Diagnosis not present

## 2021-11-30 LAB — CBC WITH DIFFERENTIAL/PLATELET
Abs Immature Granulocytes: 0.01 10*3/uL (ref 0.00–0.07)
Basophils Absolute: 0.1 10*3/uL (ref 0.0–0.1)
Basophils Relative: 1 %
Eosinophils Absolute: 0.2 10*3/uL (ref 0.0–0.5)
Eosinophils Relative: 3 %
HCT: 48.3 % (ref 39.0–52.0)
Hemoglobin: 15.3 g/dL (ref 13.0–17.0)
Immature Granulocytes: 0 %
Lymphocytes Relative: 27 %
Lymphs Abs: 2.3 10*3/uL (ref 0.7–4.0)
MCH: 25.9 pg — ABNORMAL LOW (ref 26.0–34.0)
MCHC: 31.7 g/dL (ref 30.0–36.0)
MCV: 81.7 fL (ref 80.0–100.0)
Monocytes Absolute: 0.6 10*3/uL (ref 0.1–1.0)
Monocytes Relative: 7 %
Neutro Abs: 5.3 10*3/uL (ref 1.7–7.7)
Neutrophils Relative %: 62 %
Platelets: 209 10*3/uL (ref 150–400)
RBC: 5.91 MIL/uL — ABNORMAL HIGH (ref 4.22–5.81)
RDW: 17.2 % — ABNORMAL HIGH (ref 11.5–15.5)
WBC: 8.4 10*3/uL (ref 4.0–10.5)
nRBC: 0 % (ref 0.0–0.2)

## 2021-11-30 LAB — BASIC METABOLIC PANEL
Anion gap: 8 (ref 5–15)
BUN: 16 mg/dL (ref 8–23)
CO2: 27 mmol/L (ref 22–32)
Calcium: 8.9 mg/dL (ref 8.9–10.3)
Chloride: 102 mmol/L (ref 98–111)
Creatinine, Ser: 1.05 mg/dL (ref 0.61–1.24)
GFR, Estimated: >60 mL/min (ref >60)
Glucose, Bld: 103 mg/dL — ABNORMAL HIGH (ref 70–99)
Potassium: 3.9 mmol/L (ref 3.5–5.1)
Sodium: 137 mmol/L (ref 135–145)

## 2021-11-30 LAB — TROPONIN I (HIGH SENSITIVITY): Troponin I (High Sensitivity): 8 ng/L

## 2021-11-30 NOTE — ED Notes (Signed)
Pt states he took his BP medication tonight. Pt feels as though the vein in his temple is pounding. Pt is sensitive to lights with nausea.

## 2021-11-30 NOTE — ED Triage Notes (Signed)
Pt with c/o HA and blurry vision for past 2 weeks.  Pt states has gotten worse.  200/105 at home per pt. Denies any one sided weakness or numbness.

## 2021-11-30 NOTE — ED Notes (Signed)
EDP made aware of pt's BP

## 2021-12-01 ENCOUNTER — Emergency Department (HOSPITAL_COMMUNITY): Payer: PPO

## 2021-12-01 DIAGNOSIS — R4182 Altered mental status, unspecified: Secondary | ICD-10-CM | POA: Diagnosis not present

## 2021-12-01 DIAGNOSIS — R519 Headache, unspecified: Secondary | ICD-10-CM | POA: Diagnosis not present

## 2021-12-01 LAB — TROPONIN I (HIGH SENSITIVITY): Troponin I (High Sensitivity): 7 ng/L (ref <18)

## 2021-12-01 MED ORDER — HYDRALAZINE HCL 20 MG/ML IJ SOLN
5.0000 mg | Freq: Once | INTRAMUSCULAR | Status: AC
  Administered 2021-12-01: 5 mg via INTRAVENOUS
  Filled 2021-12-01: qty 1

## 2021-12-01 MED ORDER — DEXAMETHASONE SODIUM PHOSPHATE 10 MG/ML IJ SOLN
10.0000 mg | Freq: Once | INTRAMUSCULAR | Status: AC
  Administered 2021-12-01: 10 mg via INTRAVENOUS
  Filled 2021-12-01: qty 1

## 2021-12-01 MED ORDER — METOCLOPRAMIDE HCL 5 MG/ML IJ SOLN
10.0000 mg | Freq: Once | INTRAMUSCULAR | Status: AC
  Administered 2021-12-01: 10 mg via INTRAVENOUS
  Filled 2021-12-01: qty 2

## 2021-12-01 MED ORDER — DIPHENHYDRAMINE HCL 50 MG/ML IJ SOLN
25.0000 mg | Freq: Once | INTRAMUSCULAR | Status: AC
  Administered 2021-12-01: 25 mg via INTRAVENOUS
  Filled 2021-12-01: qty 1

## 2021-12-01 MED ORDER — KETOROLAC TROMETHAMINE 30 MG/ML IJ SOLN
15.0000 mg | Freq: Once | INTRAMUSCULAR | Status: AC
  Administered 2021-12-01: 15 mg via INTRAVENOUS
  Filled 2021-12-01: qty 1

## 2021-12-01 NOTE — ED Notes (Signed)
Pt ambulated to the bathroom

## 2021-12-01 NOTE — ED Notes (Signed)
ED Provider at bedside. 

## 2021-12-01 NOTE — ED Provider Notes (Signed)
[facility] EMERGENCY DEPARTMENT Provider Note   CSN: [id number] Arrival date & time: 11/30/21  2107     History  Chief Complaint  Patient presents with   Headache    Kenneth Fuller is a 76 y.o. male.  76 yo M here with headache and hypertension. H/o hypertension on medications and has been compliant. Headache for the last couple weeks. Saw PCP last week without resolution. BP consistently high over couple weeks, normally not above 160. No neurologic changes but has had some intermittent blurry vision.    Headache      Home Medications Prior to Admission medications   Medication Sig Start Date End Date Taking? Authorizing Provider  albuterol (PROVENTIL) (2.5 MG/3ML) 0.083% nebulizer solution USE 1 VIAL IN NEBULIZER EVERY 6 HOURS AS NEEDED FOR WHEEZING AND FOR SHORTNESS OF BREATH 03/10/21   [name], [name], MD  albuterol (VENTOLIN HFA) 108 (90 Base) MCG/ACT inhaler Inhale 2 puffs into the lungs every 6 (six) hours as needed for wheezing or shortness of breath. 05/05/20   Claretta Fraise, MD  bisoprolol (ZEBETA) 5 MG tablet Take 1/2 (one-half) tablet by mouth once daily 09/14/21   [name], [name], MD  Blood Glucose Monitoring Suppl ([place]) w/Device KIT Use to test blood sugar daily as directed. DX: E11.65 08/14/21   [name], [name], MD  cetirizine (ZYRTEC) 10 MG tablet Take 1 tablet (10 mg total) by mouth daily. 11/20/20   Evelina Dun A, FNP  Cholecalciferol (VITAMIN D3) 125 MCG (5000 UT) TABS Take 5,000 Units by mouth daily.    [provider]  cycloSPORINE (RESTASIS) 0.05 % ophthalmic emulsion Apply to eye. 12/07/19   [provider]  dapagliflozin propanediol (FARXIGA) 10 MG TABS tablet Take 1 tablet (10 mg total) by mouth daily. 03/27/21   [name], [name], MD  fluticasone (FLONASE) 50 MCG/ACT nasal spray Place 2 sprays into both nostrils daily. 11/20/20   Sharion Balloon, FNP  furosemide (LASIX) 40 MG tablet Take 40 mg by  mouth daily as needed (fluid retention.).    [provider]  glimepiride (AMARYL) 4 MG tablet Take by mouth.    [provider]  glucose blood (ONETOUCH VERIO) test strip Use to test blood sugar daily as directed. DX: E11.65 08/14/21   [name], [name], MD  hydrALAZINE (APRESOLINE) 25 MG tablet Take 3 tablets (75 mg total) by mouth 3 (three) times daily. 09/14/21   [name], [name], MD  hydrALAZINE (APRESOLINE) 50 MG tablet Take 1 tablet (50 mg total) by mouth 3 (three) times daily. 09/14/21   [name], [name], MD  isosorbide dinitrate (ISORDIL) 20 MG tablet Take 1 tablet (20 mg total) by mouth 3 (three) times daily. 09/14/21   [name], [name], MD  Lancets (ONETOUCH DELICA PLUS [id number]) MISC Use to test blood sugar daily as directed. DX: E11.65 08/14/21   [name], [name], MD  meclizine (ANTIVERT) 25 MG tablet Take 1 tablet (25 mg total) by mouth 3 (three) times daily as needed for dizziness. 04/22/21   [name], [name], MD  mirabegron ER (MYRBETRIQ) 25 MG TB24 tablet Take 1 tablet (25 mg total) by mouth daily. 03/27/21   [name], [name], MD  nitroGLYCERIN (NITROSTAT) 0.4 MG SL tablet Place 1 tablet (0.4 mg total) under the tongue every 5 (five) minutes x 3 doses as needed for chest pain. Patient not taking: Reported on 04/17/2021 06/07/20 07/07/20  Heath Lark D, DO  olmesartan (BENICAR) 20 MG tablet Take 1  tablet (20 mg total) by mouth daily. 01/15/21   Tanda Rockers, MD  omeprazole (PRILOSEC) 40 MG capsule TAKE 1 CAPSULE BY MOUTH ONCE DAILY 30-60  MINUTES  PRIOR  TO  BREAKFAST 08/03/21   Milus Banister, MD  roflumilast (DALIRESP) 500 MCG TABS tablet Take 1 tablet (500 mcg total) by mouth daily. 03/27/21   [name], [name], MD  Semaglutide,0.25 or 0.5MG/DOS, (OZEMPIC, 0.25 OR 0.5 MG/DOSE,) 2 MG/3ML SOPN Inject 0.5 mg into the skin once a week.    [provider]  sertraline (ZOLOFT) 100 MG tablet Take 1 tablet by mouth once daily 09/07/21   [name], [name], MD  spironolactone (ALDACTONE) 25 MG tablet Take by mouth.    [provider]  tamsulosin (FLOMAX) 0.4 MG CAPS capsule TAKE 1 CAPSULE BY MOUTH ONCE DAILY AFTER SUPPER 10/26/21   [name], [name], MD      Allergies    Amlodipine besy-benazepril hcl, Hydrocodone, Oxycodone, and Phenergan [promethazine hcl]    Review of Systems   Review of Systems  Neurological:  Positive for headaches.    Physical Exam Updated Vital Signs BP (!) 157/68 (BP Location: Left Arm)   Pulse 70   Temp 97.6 F (36.4 C)   Resp 17   Ht _0  (1.702 m)   Wt 102.1 kg   SpO2 97%   BMI 35.24 kg/m  Physical Exam Vitals and nursing note reviewed.  Constitutional:      Appearance: He is well-developed.  HENT:     Head: Normocephalic and atraumatic.  Cardiovascular:     Rate and Rhythm: Normal rate.  Pulmonary:     Effort: Pulmonary effort is normal. No respiratory distress.  Abdominal:     General: There is no distension.  Musculoskeletal:        General: Normal range of motion.     Cervical back: Normal range of motion.  Skin:    General: Skin is warm and dry.  Neurological:     Mental Status: He is alert. Mental status is at baseline.     Comments: No altered mental status, able to give full seemingly accurate history.  Face is symmetric, EOM's intact, pupils equal and reactive, vision intact, tongue and uvula midline without deviation. Upper and Lower extremity motor 5/5, intact pain perception in distal extremities, 2+ reflexes in biceps, patella and achilles tendons. Able to perform finger to nose normal with both hands. Walks without assistance or evident ataxia.       ED Results / Procedures / Treatments   Labs (all labs ordered are listed, but only abnormal results are displayed) Labs Reviewed  CBC WITH DIFFERENTIAL/PLATELET - Abnormal; Notable for the following components:      Result Value   RBC 5.91 (*)    MCH 25.9 (*)    RDW 17.2 (*)    All other components within  normal limits  BASIC METABOLIC PANEL - Abnormal; Notable for the following components:   Glucose, Bld 103 (*)    All other components within normal limits  TROPONIN I (HIGH SENSITIVITY)  TROPONIN I (HIGH SENSITIVITY)    EKG None  Radiology CT Head Wo Contrast  Result Date: 12/01/2021 CLINICAL DATA:  evaluate for sinus disease/pott's puffy tumor; Mental status change, unknown cause Headache, chronic, new features or increased frequency EXAM: CT HEAD WITHOUT CONTRAST CT MAXILLOFACIAL WITHOUT CONTRAST TECHNIQUE: Multidetector CT imaging of the head and maxillofacial structures were performed using the standard protocol without intravenous contrast. Multiplanar CT image  reconstructions of the maxillofacial structures were also generated. RADIATION DOSE REDUCTION: This exam was performed according to the departmental dose-optimization program which includes automated exposure control, adjustment of the mA and/or kV according to patient size and/or use of iterative reconstruction technique. COMPARISON:  None Available. FINDINGS: CT HEAD FINDINGS Brain: No evidence of acute infarction, hemorrhage, hydrocephalus, extra-axial collection or mass lesion/mass effect. Vascular: No hyperdense vessel identified. Skull: No acute fracture. Other: No mastoid effusions. CT MAXILLOFACIAL FINDINGS Osseous: No fracture or mandibular dislocation. No destructive process. Orbits: Negative. No traumatic or inflammatory finding. Sinuses: Clear. Soft tissues: Negative. IMPRESSION: 1. No evidence of acute intracranial abnormality. 2. Clear sinuses. Electronically Signed   By: Margaretha Sheffield M.D.   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 Multiplanar CT image reconstructions of the maxillofacial structures were also generated. RADIATION DOSE REDUCTION: This exam was performed according to the departmental dose-optimization program which includes automated exposure control, adjustment of the mA and/or kV according to patient size and/or use of iterative reconstruction technique. COMPARISON:  None Available. FINDINGS: CT HEAD FINDINGS Brain: No evidence of acute infarction, hemorrhage, hydrocephalus, extra-axial collection or mass lesion/mass effect. Vascular: No hyperdense vessel identified. Skull: No acute fracture. Other: No mastoid effusions. CT MAXILLOFACIAL FINDINGS Osseous: No fracture or mandibular dislocation. No destructive process. Orbits: Negative. No traumatic or inflammatory finding. Sinuses: Clear. Soft tissues: Negative. IMPRESSION: 1. No evidence of acute intracranial abnormality. 2. Clear sinuses. Electronically Signed   By: Margaretha Sheffield M.D.   On: 12/01/2021 02:44   DG Chest 2 View  Result Date: 11/30/2021 CLINICAL DATA:  Chest pain EXAM: CHEST - 2 VIEW COMPARISON:  11/20/2020 FINDINGS: Cardiac shadow is within normal limits. Lungs are clear bilaterally. Degenerative changes of the thoracic spine are noted. Postsurgical changes in the cervical spine are again seen. No soft tissue abnormality is noted. IMPRESSION: No acute abnormality noted. Electronically Signed   By: Inez Catalina M.D.   On: 11/30/2021 21:37    Procedures Procedures    Medications Ordered in ED Medications  metoCLOPramide (REGLAN) injection 10 mg (10 mg Intravenous Given 12/01/21 0228)  diphenhydrAMINE (BENADRYL) injection 25 mg (25 mg Intravenous Given 12/01/21 0229)  dexamethasone (DECADRON) injection 10 mg (10 mg Intravenous Given 12/01/21 0229)  ketorolac (TORADOL) 30 MG/ML injection 15 mg (15 mg Intravenous Given 12/01/21  0341)  hydrALAZINE (APRESOLINE) injection 5 mg (5 mg Intravenous Given 12/01/21 0341)    ED Course/ Medical Decision Making/ A&P                           Medical Decision Making Amount and/or Complexity of Data Reviewed Labs: ordered. Radiology: ordered.  Risk Prescription drug management.   Work-up negative.  Suspect that he has either new onset migraine with associated hypertension or primarily a  hypertension headache.  otherwise headache is improving with his blood pressures here in the emergency department.  Low suspicion for significant intracranial masses quiring MRI or further imaging however will need neurologic and PCP follow-up to ensure improvement and for further management.   Final Clinical Impression(s) / ED Diagnoses Final diagnoses:  Nonintractable headache, unspecified chronicity pattern, unspecified headache  type  Hypertension, unspecified type    Rx / DC Orders ED Discharge Orders          Ordered    Ambulatory referral to Neurology       Comments: An appointment is requested in approximately: 2 weeks   12/01/21 0351              La Dibella, Corene Cornea, MD 12/01/21 [id number]

## 2021-12-01 NOTE — ED Notes (Signed)
Patient transported to CT

## 2021-12-03 ENCOUNTER — Other Ambulatory Visit: Payer: Self-pay | Admitting: Family

## 2021-12-03 ENCOUNTER — Ambulatory Visit (INDEPENDENT_AMBULATORY_CARE_PROVIDER_SITE_OTHER): Payer: PPO | Admitting: Family Medicine

## 2021-12-03 ENCOUNTER — Encounter: Payer: Self-pay | Admitting: Family Medicine

## 2021-12-03 VITALS — BP 125/68 | HR 76 | Temp 98.2°F | Ht 67.0 in | Wt 230.0 lb

## 2021-12-03 DIAGNOSIS — G4452 New daily persistent headache (NDPH): Secondary | ICD-10-CM | POA: Diagnosis not present

## 2021-12-03 DIAGNOSIS — I10 Essential (primary) hypertension: Secondary | ICD-10-CM

## 2021-12-03 DIAGNOSIS — J441 Chronic obstructive pulmonary disease with (acute) exacerbation: Secondary | ICD-10-CM

## 2021-12-03 DIAGNOSIS — H9313 Tinnitus, bilateral: Secondary | ICD-10-CM

## 2021-12-03 NOTE — Progress Notes (Signed)
BP 125/68   Pulse 76   Temp 98.2 F (36.8 C)   Ht _0  (1.702 m)   Wt 230 lb (104.3 kg)   SpO2 95%   BMI 36.02 kg/m    Subjective:   Patient ID: Kenneth Fuller, male    DOB: [birth year], 76 y.o.   MRN: [id number]  HPI: Kenneth Fuller is a 76 y.o. male presenting on 12/03/2021 for Medical Management of Chronic Issues (ER follow up), Hypertension, Headache, and Tinnitus   HPI Patient is coming in today for ER follow-up for headache and tinnitus and high blood pressure.  His blood pressure looks good today and has been running better.  He was still having headaches in the ER and it got worse and was having light sensitivity.  His headaches are better but he still is having some light sensitivity and persistent ringing in his ears that has been increased over the past few months.  He says it is just getting worse and he does not know if he has migraines or headaches or what is causing the reason his ears and headaches.  Relevant past medical, surgical, family and social history reviewed and updated as indicated. Interim medical history since our last visit reviewed. Allergies and medications reviewed and updated.  Review of Systems  Constitutional:  Negative for chills and fever.  HENT:  Positive for tinnitus.   Eyes:  Positive for photophobia. Negative for visual disturbance.  Respiratory:  Negative for shortness of breath and wheezing.   Cardiovascular:  Negative for chest pain and leg swelling.  Musculoskeletal:  Negative for back pain and gait problem.  Skin:  Negative for rash.  Neurological:  Positive for headaches. Negative for dizziness, weakness and light-headedness.  All other systems reviewed and are negative.   Per HPI unless specifically indicated above   Allergies as of 12/03/2021       Reactions   Amlodipine Besy-benazepril Hcl Swelling, Other (See Comments)   Makes tongue swell (lotrel)   Hydrocodone Itching   Can tolerate in low doses   Oxycodone  Itching   Phenergan [promethazine Hcl] Other (See Comments)   "I can't remember."        Medication List        Accurate as of December 03, 2021  9:41 AM. If you have any questions, ask your nurse or doctor.          albuterol 108 (90 Base) MCG/ACT inhaler Commonly known as: VENTOLIN HFA Inhale 2 puffs into the lungs every 6 (six) hours as needed for wheezing or shortness of breath.   albuterol (2.5 MG/3ML) 0.083% nebulizer solution Commonly known as: PROVENTIL USE 1 VIAL IN NEBULIZER EVERY 6 HOURS AS NEEDED FOR WHEEZING AND FOR SHORTNESS OF BREATH   bisoprolol 5 MG tablet Commonly known as: ZEBETA Take 1/2 (one-half) tablet by mouth once daily   cetirizine 10 MG tablet Commonly known as: ZYRTEC Take 1 tablet (10 mg total) by mouth daily.   cycloSPORINE 0.05 % ophthalmic emulsion Commonly known as: RESTASIS Apply to eye.   dapagliflozin propanediol 10 MG Tabs tablet Commonly known as: Farxiga Take 1 tablet (10 mg total) by mouth daily.   fluticasone 50 MCG/ACT nasal spray Commonly known as: FLONASE Place 2 sprays into both nostrils daily.   furosemide 40 MG tablet Commonly known as: LASIX Take 40 mg by mouth daily as needed (fluid retention.).   glimepiride 4 MG tablet Commonly known as: AMARYL Take by mouth.   hydrALAZINE 25  MG tablet Commonly known as: APRESOLINE Take 3 tablets (75 mg total) by mouth 3 (three) times daily.   hydrALAZINE 50 MG tablet Commonly known as: APRESOLINE Take 1 tablet (50 mg total) by mouth 3 (three) times daily.   isosorbide dinitrate 20 MG tablet Commonly known as: ISORDIL Take 1 tablet (20 mg total) by mouth 3 (three) times daily.   meclizine 25 MG tablet Commonly known as: ANTIVERT Take 1 tablet (25 mg total) by mouth 3 (three) times daily as needed for dizziness.   mirabegron ER 25 MG Tb24 tablet Commonly known as: Myrbetriq Take 1 tablet (25 mg total) by mouth daily.   nitroGLYCERIN 0.4 MG SL tablet Commonly  known as: NITROSTAT Place 1 tablet (0.4 mg total) under the tongue every 5 (five) minutes x 3 doses as needed for chest pain.   olmesartan 20 MG tablet Commonly known as: Benicar Take 1 tablet (20 mg total) by mouth daily.   omeprazole 40 MG capsule Commonly known as: PRILOSEC TAKE 1 CAPSULE BY MOUTH ONCE DAILY 30-60  MINUTES  PRIOR  TO  BREAKFAST   OneTouch Delica Plus [id number] Misc Use to test blood sugar daily as directed. DX: E11.65   OneTouch Verio Flex System w/Device Kit Use to test blood sugar daily as directed. DX: E11.65   OneTouch Verio test strip Generic drug: glucose blood Use to test blood sugar daily as directed. DX: E11.65   Ozempic (0.25 or 0.5 MG/DOSE) 2 MG/3ML Sopn Generic drug: Semaglutide(0.25 or 0.5MG/DOS) Inject 0.5 mg into the skin once a week.   roflumilast 500 MCG Tabs tablet Commonly known as: DALIRESP Take 1 tablet (500 mcg total) by mouth daily.   sertraline 100 MG tablet Commonly known as: ZOLOFT Take 1 tablet by mouth once daily   spironolactone 25 MG tablet Commonly known as: ALDACTONE Take by mouth.   tamsulosin 0.4 MG Caps capsule Commonly known as: FLOMAX TAKE 1 CAPSULE BY MOUTH ONCE DAILY AFTER SUPPER   Vitamin D3 125 MCG (5000 UT) Tabs Take 5,000 Units by mouth daily.         Objective:   BP 125/68   Pulse 76   Temp 98.2 F (36.8 C)   Ht _0  (1.702 m)   Wt 230 lb (104.3 kg)   SpO2 95%   BMI 36.02 kg/m   Wt Readings from Last 3 Encounters:  12/03/21 230 lb (104.3 kg)  11/30/21 225 lb (102.1 kg)  11/19/21 228 lb (103.4 kg)    Physical Exam Vitals and nursing note reviewed.  Constitutional:      General: He is not in acute distress.    Appearance: He is well-developed. He is not diaphoretic.  Eyes:     General: No scleral icterus.       Right eye: No discharge.     Conjunctiva/sclera: Conjunctivae normal.     Pupils: Pupils are equal, round, and reactive to light.  Neck:     Thyroid: No thyromegaly.   Cardiovascular:     Rate and Rhythm: Normal rate and regular rhythm.     Heart sounds: Normal heart sounds. No murmur heard. Pulmonary:     Effort: Pulmonary effort is normal. No respiratory distress.     Breath sounds: Normal breath sounds. No wheezing.  Musculoskeletal:        General: Normal range of motion.     Cervical back: Neck supple.  Lymphadenopathy:     Cervical: No cervical adenopathy.  Skin:    General: Skin is  warm and dry.     Findings: No rash.  Neurological:     Mental Status: He is alert and oriented to person, place, and time.     Coordination: Coordination normal.  Psychiatric:        Behavior: Behavior normal.       Assessment & Plan:   Problem List Items Addressed This Visit       Cardiovascular and Mediastinum   Essential hypertension - Primary   Relevant Orders   Ambulatory referral to Neurology   MR Brain Wo Contrast   Other Visit Diagnoses     New daily persistent headache       Relevant Orders   Ambulatory referral to Neurology   MR Brain Wo Contrast   Tinnitus of both ears       Relevant Orders   Ambulatory referral to Neurology   MR Brain Wo Contrast     We will refer to neurology, will also order an MRI brain.  The initial work-up with inflammatory markers was normal.  Follow up plan: Return if symptoms worsen or fail to improve.  Counseling provided for all of the vaccine components Orders Placed This Encounter  Procedures   MR Brain Wo Contrast   Ambulatory referral to Neurology    Caryl Pina, MD [facility] 12/03/2021, 9:41 AM

## 2021-12-04 ENCOUNTER — Other Ambulatory Visit: Payer: Self-pay | Admitting: Family Medicine

## 2021-12-04 DIAGNOSIS — F339 Major depressive disorder, recurrent, unspecified: Secondary | ICD-10-CM

## 2021-12-08 DIAGNOSIS — G4733 Obstructive sleep apnea (adult) (pediatric): Secondary | ICD-10-CM | POA: Diagnosis not present

## 2021-12-08 DIAGNOSIS — R0902 Hypoxemia: Secondary | ICD-10-CM | POA: Diagnosis not present

## 2021-12-08 DIAGNOSIS — I5032 Chronic diastolic (congestive) heart failure: Secondary | ICD-10-CM | POA: Diagnosis not present

## 2021-12-08 DIAGNOSIS — J449 Chronic obstructive pulmonary disease, unspecified: Secondary | ICD-10-CM | POA: Diagnosis not present

## 2021-12-10 ENCOUNTER — Telehealth: Payer: Self-pay

## 2021-12-10 NOTE — Telephone Encounter (Signed)
     Patient  visit on 12/01/2021  at [facility] you been able to follow up with your primary care physician? YES  The patient was or was not able to obtain any needed medicine or equipment. YES  Are there diet recommendations that you are having difficulty following? NA  Patient expresses understanding of discharge instructions and education provided has no other needs at this time. [place], [facility], Care Management  [phone] [address], [place], [place] [address] Phone: [phone] Email: [email or web address]

## 2021-12-16 ENCOUNTER — Encounter: Payer: Self-pay | Admitting: Family Medicine

## 2021-12-16 ENCOUNTER — Ambulatory Visit (INDEPENDENT_AMBULATORY_CARE_PROVIDER_SITE_OTHER): Payer: PPO | Admitting: Family Medicine

## 2021-12-16 DIAGNOSIS — J45991 Cough variant asthma: Secondary | ICD-10-CM | POA: Diagnosis not present

## 2021-12-16 MED ORDER — BENZONATATE 200 MG PO CAPS: 200.0000 mg | ORAL_CAPSULE | Freq: Three times a day (TID) | ORAL | 0 refills | Status: DC | PRN

## 2021-12-16 NOTE — Progress Notes (Signed)
Subjective:    Patient ID: Kenneth Fuller, male    DOB: 1945-08-28, 76 y.o.   MRN: [id number]   HPI: Kenneth Fuller is a 76 y.o. male presenting for 2 days of cough and congestion. Dry cough. A little wheezing. No fever. Exposed to RSV last week by great grandson. Some dyspnea.       12/03/2021    9:22 AM 11/19/2021    9:49 AM 09/14/2021    3:44 PM 08/12/2021   10:47 AM 06/25/2021    2:15 PM  Depression screen PHQ 2/9  Decreased Interest 0 0 0 0 1  Down, Depressed, Hopeless 0 0 0 1 1  PHQ - 2 Score 0 0 0 1 2  Altered sleeping 0 0 0 0 1  Tired, decreased energy 0 0 1 0 1  Change in appetite 0 0 1 0 0  Feeling bad or failure about yourself  0 0 0 0 1  Trouble concentrating 0 0 1 0 1  Moving slowly or fidgety/restless 0 0 0 0 1  Suicidal thoughts 0 0 0 0 0  PHQ-9 Score 0 0 _0 Difficult doing work/chores Not difficult at all Not difficult at all Not difficult at all Not difficult at all Somewhat difficult     Relevant past medical, surgical, family and social history reviewed and updated as indicated.  Interim medical history since our last visit reviewed. Allergies and medications reviewed and updated.  ROS:  Review of Systems  Constitutional:  Negative for activity change, appetite change, chills and fever.  HENT:  Positive for congestion, postnasal drip, rhinorrhea and sinus pressure. Negative for ear discharge, ear pain, hearing loss, nosebleeds, sneezing and trouble swallowing.   Respiratory:  Positive for cough. Negative for chest tightness.   Cardiovascular:  Negative for chest pain and palpitations.  Skin:  Negative for rash.     Social History   Tobacco Use  Smoking Status Former   Packs/day: 1.00   Types: Cigarettes   Start date: 02/09/1964   Quit date: 02/08/1969   Years since quitting: 52.8  Smokeless Tobacco Former   Quit date: 1971       Objective:     Wt Readings from Last 3 Encounters:  12/03/21 230 lb (104.3 kg)  11/30/21 225 lb  (102.1 kg)  11/19/21 228 lb (103.4 kg)     Exam deferred. Pt. Harbor[place] due to COVID 19. Phone visit performed.   Assessment & Plan:   1. Cough variant asthma     Meds ordered this encounter  Medications   benzonatate (TESSALON) 200 MG capsule    Sig: Take 1 capsule (200 mg total) by mouth 3 (three) times daily as needed for cough.    Dispense:  20 capsule    Refill:  0    Orders Placed This Encounter  Procedures   COVID-19, Flu A+B and RSV    Order Specific Question:   Previously tested for COVID-19    Answer:   Yes    Order Specific Question:   Resident in a congregate (group) care setting    Answer:   No    Order Specific Question:   Is the patient student?    Answer:   No    Order Specific Question:   Employed in healthcare setting    Answer:   No    Order Specific Question:   Has patient completed COVID vaccination(s) (2 doses of Pfizer/Moderna 1 dose of [employer])  Answer:   Unknown    Order Specific Question:   Release to patient    Answer:   Immediate      Diagnoses and all orders for this visit:  Cough variant asthma -     COVID-19, Flu A+B and RSV  Other orders -     benzonatate (TESSALON) 200 MG capsule; Take 1 capsule (200 mg total) by mouth 3 (three) times daily as needed for cough.    Virtual Visit via telephone Note  I discussed the limitations, risks, security and privacy concerns of performing an evaluation and management service by telephone and the availability of in person appointments. The patient was identified with two identifiers. Pt.expressed understanding and agreed to proceed. Pt. Is at home. Dr. Livia Snellen is in his office.  Follow Up Instructions:   I discussed the assessment and treatment plan with the patient. The patient was provided an opportunity to ask questions and all were answered. The patient agreed with the plan and demonstrated an understanding of the instructions.   The patient was advised to call back or seek an  in-person evaluation if the symptoms worsen or if the condition fails to improve as anticipated.   Total minutes including chart review and phone contact time: 7   Follow up plan: No follow-ups on file.  Kenneth Fraise, MD [place]

## 2021-12-17 DIAGNOSIS — J45991 Cough variant asthma: Secondary | ICD-10-CM | POA: Diagnosis not present

## 2021-12-18 LAB — COVID-19, FLU A+B AND RSV
Influenza A, NAA: NOT DETECTED
Influenza B, NAA: NOT DETECTED
RSV, NAA: DETECTED — AB
SARS-CoV-2, NAA: NOT DETECTED

## 2021-12-23 ENCOUNTER — Ambulatory Visit (INDEPENDENT_AMBULATORY_CARE_PROVIDER_SITE_OTHER): Payer: PPO | Admitting: Family Medicine

## 2021-12-23 ENCOUNTER — Encounter: Payer: Self-pay | Admitting: Family Medicine

## 2021-12-23 ENCOUNTER — Ambulatory Visit (INDEPENDENT_AMBULATORY_CARE_PROVIDER_SITE_OTHER): Payer: PPO | Admitting: Pharmacist

## 2021-12-23 DIAGNOSIS — J4489 Other specified chronic obstructive pulmonary disease: Secondary | ICD-10-CM | POA: Diagnosis not present

## 2021-12-23 DIAGNOSIS — J205 Acute bronchitis due to respiratory syncytial virus: Secondary | ICD-10-CM | POA: Diagnosis not present

## 2021-12-23 DIAGNOSIS — E1142 Type 2 diabetes mellitus with diabetic polyneuropathy: Secondary | ICD-10-CM | POA: Diagnosis not present

## 2021-12-23 DIAGNOSIS — E782 Mixed hyperlipidemia: Secondary | ICD-10-CM | POA: Diagnosis not present

## 2021-12-23 DIAGNOSIS — J40 Bronchitis, not specified as acute or chronic: Secondary | ICD-10-CM

## 2021-12-23 DIAGNOSIS — N1832 Chronic kidney disease, stage 3b: Secondary | ICD-10-CM

## 2021-12-23 MED ORDER — PREDNISONE 20 MG PO TABS: ORAL_TABLET | ORAL | 0 refills | Status: DC

## 2021-12-23 MED ORDER — AMOXICILLIN-POT CLAVULANATE 875-125 MG PO TABS: 1.0000 | ORAL_TABLET | Freq: Two times a day (BID) | ORAL | 0 refills | Status: DC

## 2021-12-23 NOTE — Progress Notes (Signed)
    12/23/2021 Name: Kenneth Fuller MRN: [id number] DOB: 1945/08/16    Diabetes: Controlled-A1C 5.5, GFR 62; current treatment: Ozempic 0.7m weekly; FARXIGA 10MG DAILY Continue Farxiga to 173mDAILY--patient stable Escribed to [facility] Will re-enroll in [place] PAP Patient has resumed Ozempic and is tolerating well; he would like to lose more weight; will increase to Ozempic 19m63mq weekly when patient assistance supply comes in Will complete patient assistance Denies personal and family history of Medullary thyroid cancer (MTC) Current glucose readings: fasting glucose <130, post prandial glucose: <180 Denies hypoglycemic/hyperglycemic symptoms Discussed meal planning options and Plate method for healthy eating Avoid sugary drinks and desserts Incorporate balanced protein, non starchy veggies, 1 serving of carbohydrate with each meal Increase water intake Increase physical activity as able Current exercise: encouraged only as able Educated on medications-purpose & side effects  Hypertension: controlled- BP goal <130/80; current treatment: bisoprolol, clonidine, hydralazine, isosorbide Current home readings:  traditional automatic cuff (reports labile from 100s-170s SBP) wrist cuff: 140/82, 123/71;  Reports hypotensive/hypertensive symptoms Counseled on taking all BP medications as prescribed and at the same time of day to keep steady control; BP is managed by cardiology, therefore patient instructed to call cards for any BP needs; patient to keep strict log of BP times and readings  Lab Results  Component Value Date   HGBA1C 5.5 09/14/2021     Lipid Panel     Component Value Date/Time   CHOL 126 09/14/2021 1551   CHOL 111 06/12/2012 1232   TRIG 155 (H) 09/14/2021 1551   TRIG 141 01/24/2014 1143   TRIG 192 (H) 06/12/2012 1232   HDL 35 (L) 09/14/2021 1551   HDL 38 (L) 01/24/2014 1143   HDL 33 (L) 06/12/2012 1232   CHOLHDL 3.6 09/14/2021 1551   CHOLHDL 4.8 11/22/2006  1712   VLDL 39 11/22/2006 1712   LDLCALC 64 09/14/2021 1551   LDL[place] 10/25/2013 1224   LDL[place]us 06/12/2012 1232    Patient Goals/Self-Care Activities Over the next 90 days, patient will:  - take medications as prescribed check glucose DAILY (FASTING) OR IF SYMPTOMATIC , document, and provide at future appointments check blood pressure DAILY OR IF SYMPTOMATIC, document, and provide at future appointments  Follow Up Plan: Telephone follow up appointment with care management team member scheduled for: [id number] EckarmD, BCPS Clinical Pharmacist, [place] Phone [phone]

## 2021-12-23 NOTE — Progress Notes (Signed)
Virtual Visit via telephone Note  I connected with Kenneth Fuller on 12/23/21 at [place] by telephone and verified that I am speaking with the correct person using two identifiers. [name] Beedy is currently located at home and patient are currently with her during visit. The provider, [name] Nala Kachel, MD is located in their office at time of visit.  Call ended at [phone]  I discussed the limitations, risks, security and privacy concerns of performing an evaluation and management service by telephone and the availability of in person appointments. I also discussed with the patient that there may be a patient responsible charge related to this service. The patient expressed understanding and agreed to proceed.   History and Present Illness: Patient went to the ER on 10/23 and than followed up and tested positive for RSV.  He has chest congestion and cough.  He is having coughing and wheezing without swelling and fluid.  He is using nebulizers and inhalers. He does a breathing treatment and it helps some.   1. RSV bronchitis   2. Bronchitis   3. Asthma-COPD overlap syndrome     Outpatient Encounter Medications as of 12/23/2021  Medication Sig   amoxicillin-clavulanate (AUGMENTIN) 875-125 MG tablet Take 1 tablet by mouth 2 (two) times daily.   predniSONE (DELTASONE) 20 MG tablet 2 po at same time daily for 5 days   albuterol (PROVENTIL) (2.5 MG/3ML) 0.083% nebulizer solution USE 1 VIAL IN NEBULIZER EVERY 6 HOURS AS NEEDED FOR WHEEZING AND FOR SHORTNESS OF BREATH   albuterol (VENTOLIN HFA) 108 (90 Base) MCG/ACT inhaler Inhale 2 puffs into the lungs every 6 (six) hours as needed for wheezing or shortness of breath.   benzonatate (TESSALON) 200 MG capsule Take 1 capsule (200 mg total) by mouth 3 (three) times daily as needed for cough.   bisoprolol (ZEBETA) 5 MG tablet Take 1/2 (one-half) tablet by mouth once daily   Blood Glucose Monitoring Suppl ([place]) w/Device  KIT Use to test blood sugar daily as directed. DX: E11.65   cetirizine (ZYRTEC) 10 MG tablet Take 1 tablet by mouth once daily   Cholecalciferol (VITAMIN D3) 125 MCG (5000 UT) TABS Take 5,000 Units by mouth daily.   cycloSPORINE (RESTASIS) 0.05 % ophthalmic emulsion Apply to eye.   dapagliflozin propanediol (FARXIGA) 10 MG TABS tablet Take 1 tablet (10 mg total) by mouth daily.   fluticasone (FLONASE) 50 MCG/ACT nasal spray Place 2 sprays into both nostrils daily.   furosemide (LASIX) 40 MG tablet Take 40 mg by mouth daily as needed (fluid retention.).   glimepiride (AMARYL) 4 MG tablet Take by mouth.   glucose blood (ONETOUCH VERIO) test strip Use to test blood sugar daily as directed. DX: E11.65   hydrALAZINE (APRESOLINE) 25 MG tablet Take 3 tablets (75 mg total) by mouth 3 (three) times daily.   hydrALAZINE (APRESOLINE) 50 MG tablet Take 1 tablet (50 mg total) by mouth 3 (three) times daily.   isosorbide dinitrate (ISORDIL) 20 MG tablet Take 1 tablet (20 mg total) by mouth 3 (three) times daily.   Lancets (ONETOUCH DELICA PLUS CXKGYJ85U) MISC Use to test blood sugar daily as directed. DX: E11.65   meclizine (ANTIVERT) 25 MG tablet Take 1 tablet (25 mg total) by mouth 3 (three) times daily as needed for dizziness.   mirabegron ER (MYRBETRIQ) 25 MG TB24 tablet Take 1 tablet (25 mg total) by mouth daily.   nitroGLYCERIN (NITROSTAT) 0.4 MG SL tablet Place 1 tablet (0.4 mg  total) under the tongue every 5 (five) minutes x 3 doses as needed for chest pain. (Patient not taking: Reported on 04/17/2021)   olmesartan (BENICAR) 20 MG tablet Take 1 tablet (20 mg total) by mouth daily.   omeprazole (PRILOSEC) 40 MG capsule TAKE 1 CAPSULE BY MOUTH ONCE DAILY 30-60  MINUTES  PRIOR  TO  BREAKFAST   roflumilast (DALIRESP) 500 MCG TABS tablet Take 1 tablet (500 mcg total) by mouth daily.   Semaglutide,0.25 or 0.5MG/DOS, (OZEMPIC, 0.25 OR 0.5 MG/DOSE,) 2 MG/3ML SOPN Inject 0.5 mg into the skin once a week.    sertraline (ZOLOFT) 100 MG tablet Take 1 tablet by mouth once daily   spironolactone (ALDACTONE) 25 MG tablet Take by mouth.   tamsulosin (FLOMAX) 0.4 MG CAPS capsule TAKE 1 CAPSULE BY MOUTH ONCE DAILY AFTER SUPPER   No facility-administered encounter medications on file as of 12/23/2021.    Review of Systems  Constitutional:  Negative for chills and fever.  HENT:  Positive for congestion, postnasal drip and rhinorrhea. Negative for ear discharge, ear pain, sinus pressure, sneezing, sore throat and voice change.   Eyes:  Negative for pain, discharge, redness and visual disturbance.  Respiratory:  Positive for cough. Negative for shortness of breath and wheezing.   Cardiovascular:  Negative for chest pain and leg swelling.  Musculoskeletal:  Negative for gait problem.  Skin:  Negative for rash.  All other systems reviewed and are negative.   Observations/Objective: Patient sounds comfortable and in no acute distress  Assessment and Plan: Problem List Items Addressed This Visit       Respiratory   Asthma-COPD overlap syndrome   Relevant Medications   predniSONE (DELTASONE) 20 MG tablet   Other Visit Diagnoses     RSV bronchitis    -  Primary   Bronchitis       Relevant Medications   amoxicillin-clavulanate (AUGMENTIN) 875-125 MG tablet   predniSONE (DELTASONE) 20 MG tablet       We will give amoxicillin because concern for postviral bacterial infection.  He also has a history of asthma so we will give some prednisone for possible flareup.  I think this should get better along with his inhalers but if not he is to call us back Follow up plan: Return if symptoms worsen or fail to improve.     I discussed the assessment and treatment plan with the patient. The patient was provided an opportunity to ask questions and all were answered. The patient agreed with the plan and demonstrated an understanding of the instructions.   The patient was advised to call back or seek an  in-person evaluation if the symptoms worsen or if the condition fails to improve as anticipated.  The above assessment and management plan was discussed with the patient. The patient verbalized understanding of and has agreed to the management plan. Patient is aware to call the clinic if symptoms persist or worsen. Patient is aware when to return to the clinic for a follow-up visit. Patient educated on when it is appropriate to go to the emergency department.    I provided 7 minutes of non-face-to-face time during this encounter.    Worthy Rancher, MD

## 2021-12-25 ENCOUNTER — Ambulatory Visit (HOSPITAL_COMMUNITY)
Admission: RE | Admit: 2021-12-25 | Discharge: 2021-12-25 | Disposition: A | Discharge status: Home / Self Care | Payer: PPO | Source: Ambulatory Visit | Attending: Family Medicine | Admitting: Family Medicine

## 2021-12-25 DIAGNOSIS — H9313 Tinnitus, bilateral: Secondary | ICD-10-CM | POA: Diagnosis not present

## 2021-12-25 DIAGNOSIS — G4452 New daily persistent headache (NDPH): Secondary | ICD-10-CM | POA: Insufficient documentation

## 2021-12-25 DIAGNOSIS — I10 Essential (primary) hypertension: Secondary | ICD-10-CM | POA: Insufficient documentation

## 2021-12-25 DIAGNOSIS — J3489 Other specified disorders of nose and nasal sinuses: Secondary | ICD-10-CM | POA: Diagnosis not present

## 2021-12-25 DIAGNOSIS — R519 Headache, unspecified: Secondary | ICD-10-CM | POA: Diagnosis not present

## 2021-12-29 ENCOUNTER — Telehealth: Payer: Self-pay | Admitting: Family Medicine

## 2021-12-29 MED ORDER — DAPAGLIFLOZIN PROPANEDIOL 10 MG PO TABS: 10.0000 mg | ORAL_TABLET | Freq: Every day | ORAL | 11 refills | Status: DC

## 2021-12-29 NOTE — Telephone Encounter (Signed)
Pt informed of results and recommendations. He is going to cancel neurology appt.

## 2021-12-29 NOTE — Telephone Encounter (Signed)
Patient returning call about results from 11/17 MRI. Please call back and advise

## 2021-12-30 DIAGNOSIS — G4733 Obstructive sleep apnea (adult) (pediatric): Secondary | ICD-10-CM | POA: Diagnosis not present

## 2021-12-30 DIAGNOSIS — I5032 Chronic diastolic (congestive) heart failure: Secondary | ICD-10-CM | POA: Diagnosis not present

## 2021-12-30 DIAGNOSIS — J449 Chronic obstructive pulmonary disease, unspecified: Secondary | ICD-10-CM | POA: Diagnosis not present

## 2021-12-30 DIAGNOSIS — R0902 Hypoxemia: Secondary | ICD-10-CM | POA: Diagnosis not present

## 2022-01-06 ENCOUNTER — Telehealth: Payer: Self-pay | Admitting: Family Medicine

## 2022-01-06 NOTE — Telephone Encounter (Signed)
Per Dr. Warrick Parisian pt needs to see Neurology. Pt will call [facility] and for another appt. He had canceled the 12/11 appt because he was feeling better. Pt will continue to take Flonase and Mucinex daily.

## 2022-01-06 NOTE — Telephone Encounter (Signed)
Patient had appointment 11/15 and daughter is calling to let us know that he has not gotten any better and he has started having headaches daily that last all day. Would like to know if there is anything that could be called in or anything that he can do for this. Please call back and advise.

## 2022-01-07 ENCOUNTER — Other Ambulatory Visit: Payer: PPO

## 2022-01-07 ENCOUNTER — Encounter: Payer: Self-pay | Admitting: Neurology

## 2022-01-07 ENCOUNTER — Ambulatory Visit: Payer: PPO | Admitting: Neurology

## 2022-01-07 VITALS — BP 133/78 | HR 76 | Ht 67.0 in | Wt 231.0 lb

## 2022-01-07 DIAGNOSIS — G4440 Drug-induced headache, not elsewhere classified, not intractable: Secondary | ICD-10-CM | POA: Diagnosis not present

## 2022-01-07 DIAGNOSIS — G4733 Obstructive sleep apnea (adult) (pediatric): Secondary | ICD-10-CM

## 2022-01-07 DIAGNOSIS — R809 Proteinuria, unspecified: Secondary | ICD-10-CM | POA: Diagnosis not present

## 2022-01-07 DIAGNOSIS — E1129 Type 2 diabetes mellitus with other diabetic kidney complication: Secondary | ICD-10-CM | POA: Diagnosis not present

## 2022-01-07 DIAGNOSIS — I129 Hypertensive chronic kidney disease with stage 1 through stage 4 chronic kidney disease, or unspecified chronic kidney disease: Secondary | ICD-10-CM | POA: Diagnosis not present

## 2022-01-07 DIAGNOSIS — N189 Chronic kidney disease, unspecified: Secondary | ICD-10-CM | POA: Diagnosis not present

## 2022-01-07 DIAGNOSIS — R519 Headache, unspecified: Secondary | ICD-10-CM | POA: Diagnosis not present

## 2022-01-07 DIAGNOSIS — E1122 Type 2 diabetes mellitus with diabetic chronic kidney disease: Secondary | ICD-10-CM | POA: Diagnosis not present

## 2022-01-07 DIAGNOSIS — E559 Vitamin D deficiency, unspecified: Secondary | ICD-10-CM | POA: Diagnosis not present

## 2022-01-07 DIAGNOSIS — R718 Other abnormality of red blood cells: Secondary | ICD-10-CM | POA: Diagnosis not present

## 2022-01-07 DIAGNOSIS — R0902 Hypoxemia: Secondary | ICD-10-CM | POA: Diagnosis not present

## 2022-01-07 DIAGNOSIS — I5032 Chronic diastolic (congestive) heart failure: Secondary | ICD-10-CM | POA: Diagnosis not present

## 2022-01-07 DIAGNOSIS — J449 Chronic obstructive pulmonary disease, unspecified: Secondary | ICD-10-CM | POA: Diagnosis not present

## 2022-01-07 DIAGNOSIS — I5042 Chronic combined systolic (congestive) and diastolic (congestive) heart failure: Secondary | ICD-10-CM | POA: Diagnosis not present

## 2022-01-07 NOTE — Patient Instructions (Addendum)
It was nice to see you again. I am sorry you have had these headaches.  I do believe your headaches are secondary to multiple factors, I think the primary concern is untreated obstructive sleep apnea.  As you report severe sleep apnea, you have stopped using your BiPAP about 3 months ago and symptoms could be related, untreated sleep apnea can cause severe and recurrent headaches including morning headaches and nighttime headaches.  It can also cause blood pressure surges and increase your risk for heart disease and stroke.  While you are waiting for the inspire implantation, I highly recommend that you get back on your BiPAP or CPAP machine.   Please continue to work on weight loss.  I highly recommend that you discontinue using high-dose ibuprofen every day, ibuprofen can cause rebound headaches and also overuse headaches and can affect your blood pressure and your kidney function.

## 2022-01-07 NOTE — Progress Notes (Signed)
Subjective:    Patient ID: Kenneth Fuller is a 76 y.o. male.  HPI    History:   Dear Dr. Warrick Fuller,  I saw your patient, Kenneth Fuller, upon your kind request in my neurologic clinic today for evaluation of his recurrent headaches.  The patient is unaccompanied today.  As you know, [name] is a 76 year old male with an underlying complex medical history of COPD, coronary artery disease, congestive heart failure, diabetes, hypertension, hyperlipidemia, neuropathy, obesity, sleep apnea, allergies, asthma, fibromyalgia, and obesity, who reports an approximately 1 month history of recurrent right-sided headaches.  He reports light sensitivity and pain behind the left eye, sometimes a blood vessel on the left side of his temple seems swollen, somewhat tender to touch but not very tender to touch.  He had an eye examination earlier this year.  He had new eyeglasses, prescription did not change much. He recently finished a course of antibiotics and steroids for sinus infection. Of note, he has a history of severe sleep apnea and is currently not being treated for it, he stopped using his BiPAP and previously was on CPAP.  He reports that he is pursuing inspire, he needs to lose weight before inspire can be implanted, he is working on weight loss, he is currently on Ozempic.  However, he has not used his BiPAP in the past 3 months. Of note is also high dose ibuprofen, 800 mg 3 times daily, he has no prescription strength ibuprofen, takes 200 mg pills, 4 pills at a time, he has taken a turn milligrams 3 times daily for the past 10 to 14 days.  He reports chronic kidney issues and in fact has an appointment with his kidney doctor next week.  I reviewed your office note from 12/03/2021. He had presented to the emergency room at [facility] on 11/30/2021 with headaches.  He reported a consistently high blood pressure over the past couple of weeks.  He reported intermittent blurry vision.   His blood pressure in the emergency room record was 157/68.  He was treated symptomatically for his headache with Reglan, Benadryl, Decadron, Toradol, and received hydralazine.  He was felt to have headache primarily secondary to hypertension.  He had a recent brain MRI without contrast on 12/25/2021 and I reviewed the results: IMPRESSION: 1. No acute intracranial abnormality. 2. Sequela of moderate chronic microvascular ischemic change. 3. Left maxillary sinus disease.   He tries to hydrate well, he has reduced his caffeine intake and drinks 1 cup of decaf coffee per day on average.  Has had right-sided headaches in the past.  The patient's allergies, current medications, family history, past medical history, past social history, past surgical history and problem list were reviewed and updated as appropriate.   Previously:   03/14/19: 76 year old right-handed gentleman with an underlying complex medical history of hypertension, hyperlipidemia, reflux disease, diabetes, CHF, coronary artery disease, sleep apnea, asthma, allergies, history of neuropathy, history of MRSA cellulitis, hypogonadism, low back pain, history of C1 fracture, status post neck surgery, rotator cuff surgery, prior head injury and fall with injuries and morbid obesity with a BMI of over 40, who reports recurrent headaches for the past 2 months.  He does not have a history of migraines.  He had a prescription for Maxalt as needed from Dr. Warrick Fuller.  He felt it was marginally helpful.  He has been taking ibuprofen, nearly daily, he has nearly daily headaches, sometimes he has nausea, he has seen some floaters in his visual  field in both eyes.  He had an eye examination with Dr. Marin Comment his optometrist and was told he has age-related findings.  Of note, he has severe obstructive sleep apnea and is currently not using his CPAP consistently.  He feels dry mouth which is bothersome.  He has not had a follow-up with his sleep specialist, he  used to see Dr. Claiborne Billings for this.  I was able to review his split-night sleep study from October 2017, it looks like he had severe obstructive sleep apnea with an AHI of 103/h at baseline.  He reports that his CPAP pressure is at 14.  He estimates that he uses his CPAP maybe twice a week.  He reports no significant morning headaches but has sleep disruption from nocturia which can be once or twice per night or more.  He does not drink caffeine on a day-to-day basis.  He tries to hydrate well with water.  He does use oxygen at night.  He denies any sudden onset of one-sided weakness or numbness or tingling or droopy face or slurring of speech.  Headaches are bilateral, sometimes in the front and sometimes in the back.   I have previously seen him for headaches and balance problems with status post head injury in November 2017.   I reviewed your office note from 01/24/2019.  He has been on tramadol as needed and had a oral prednisone course.  He presented to the emergency room on 01/19/2019 and reported a fall backwards hitting his head on the back about 2-1/2 weeks prior.  I reviewed the emergency room records. He had a head CT without contrast as well as cervical spine CT without contrast on 01/19/2019 and I reviewed the results: IMPRESSION: 1. No acute intracranial abnormality. No skull fracture. 2. No acute fracture or subluxation of the cervical spine. Anterior fusion hardware from C4 through C7. 3. Previous lucencies within the posterior arch of C1 are no longer seen, likely healed fractures.   Of note, he is on multiple medications.  He is on clonazepam at night.  He was recently advised to stop taking Ambien CR.     05/19/2016: 76 year old right-handed gentleman with an underlying complex medical history of morbid obesity, Hx of neck surgery and low back surgery, GERD, OSA on CPAP, HTN, neck injury secondary to fall in November 2017, during which he sustained a C1 fracture, and left rotator cuff  injury, who presents for follow-up consultation of his headaches and balance issues with status post head injury in November 2017. The patient is unaccompanied today.    05/19/2016: He reports doing better overall, but has tingling in both arms and hands. This, he feels is progressive. He reports a neck brace for about 12 weeks. After he had his latest neck CT which we reviewed today he was able to take the neck brace off per Dr. Rolena Infante instructions. He is worried about having problems with his neck again. He had his prior neck surgery under Dr. Saintclair Halsted with great results several years ago, could be 10 years ago. He saw Dr. Gladstone Lighter for LBP. He reports no recurrent headaches or lightheadedness or new balance problems, has been active but develops tingling in both shoulder areas and radiating to both arms and hands with even normal day-to-day activities such as reaching. He has had no falls. Diabetes control is good. He has a routine follow-up with his primary care physician next month.   The patient's allergies, current medications, family history, past medical history, past social  history, past surgical history and problem list were reviewed and updated as appropriate.    Previously (copied from previous notes for reference):    I first met him on 02/19/2016 at the request of his neurosurgeon, at which time the patient reported residual headaches and balance issues after he he had sustained a head injury secondary to accidental fall at home which resulted in C1 fracture. I suggested we proceed with a brain MRI and EEG. I also asked patient to refrain from taking any potentially sedating medications and use pain medication sparingly. He had a brain MRI without contrast on 02/24/2016 which showed: IMPRESSION:  Abnormal MRI brain showing mild changes of chronic microvascular ischemia and cortical atrophy. Mild paranasal chronic sinusitis changes. Large disc protusion at C 3-4 with cord compression and post  operative changes of anterior cervical fusion at C 4 and below. Recomend MRI C Spine if clinically indicated   We called him with his test results.   He had an EEG on 03/17/2016 which showed: Impression: This is a normal EEG recording in the waking state. No evidence of ictal or interictal discharges are seen.   We called him with his test results.     02/19/2016: He reports residual headaches and balace issues, post head injury on 12/26/15. He had LOC for some time and was calling out for his wife, but she could not hear him. He had gotten up to use the bathroom around 3 AM. He had not made enough light, she reports, and he did not like to turn on lights at night, but has been using a night light since the accidental fall. He has a cane and a walker from before, from when he had low back surgery.  I reviewed your office note from 02/18/15, which you kindly included. He was treated for his C1 fracture conservatively with a collar. He was admitted on 12/26/2015 after a fall in the bathtub at home. He was discharged on 12/27/2015. He had a head CT without contrast as well as a C-spine CT without contrast on 12/26/2015 which I reviewed: IMPRESSION: CT BRAIN:   1. No acute intracranial process. 2. Small scalp contusion at the right frontal vertex. 3. Mild age-related cerebral atrophy with chronic small vessel ischemic disease.   CT CERVICAL SPINE:   1. Linear lucencies traversing the posterior ring of C1 bilaterally. While these are favored to be chronic in nature, these are somewhat age indeterminate, with possible acute fractures not entirely excluded. Further evaluation with MRI is recommended to assess the chronicity of this finding. 2. No other acute traumatic injury within the cervical spine. 3. Status post ACDF at C4 through C7 without complication. He had a cervical spine MRI without contrast on 12/26/2015 which I reviewed: IMPRESSION: 1. A definite C1 fracture is not identified on the  MRI but I believe the findings on the CT scan are real and there is abnormal prevertebral fluid and hemorrhage which would suggest a fracture. 2. Central disc protrusion at C3-4 with mild mass effect on the ventral thecal sac and mild bilateral foraminal encroachment. 3. Solid fusion changes from Y7-X4 without complicating features. 4. Normal appearance of the cervical spinal cord. He had a repeat CT cervical spine without contrast on 02/06/2016 which I reviewed: IMPRESSION: 1. Linear lucency through the inferior margin of the right and left posterior arches of C1 which are unchanged compared with 12/26/2015 and may reflect incomplete ununited nondisplaced fractures versus vascular foramina. No significant interval change compared with 12/26/2015.  2. Anterior cervical disc fusion from C4 through C7.   As I understand, he will be using his hard neck collar for at least 6 more weeks. He has not been driving and has been advised not to drive secondary to his neck fracture and using a hard collar. He has not had any episodes of confusion or convulsions. He does not have any one-sided weakness or numbness. He does have some tingling in the right shoulder area and proximal right arm area. When he fell he initially was not able to move either arm or leg he remembers. He has a history of obstructive sleep apnea and has been trying to use his CPAP. He tries to sleep in the recliner for more comfort and ease of use with his CPAP. It is difficult for him to use his CPAP currently. He has intermittent right parietal headaches which are short-lived, sometimes just a few minutes, as long as 5 or 10 minutes at most. They are moderately severe but not long enough for him to take any medication. Of note, he is not on any narcotic pain medication and takes as needed tramadol which he has been taking sparingly. He has not had any visual symptoms or speech impairment, memory seems stable and adequate.  His Past  Medical History Is Significant For: Past Medical History:  Diagnosis Date   Allergy    Asthma    CAD (coronary artery disease)    CHF (congestive heart failure) (HCC)    COPD (chronic obstructive pulmonary disease) (HCC)    Diabetes mellitus    Fibromyalgia    GERD (gastroesophageal reflux disease)    GI bleeding    Gout    Hyperlipidemia    Hypertension    Hypogonadism male    Insomnia    MRSA cellulitis    Neuropathy    Obesity    Shortness of breath dyspnea    with exertion    Sleep apnea    cpap- 14    Wheezing    no asthma diagnosis    His Past Surgical History Is Significant For: Past Surgical History:  Procedure Laterality Date   BACK SURGERY     BIOPSY  12/20/2019   Procedure: BIOPSY;  Surgeon: Milus Banister, MD;  Location: [facility];  Service: Endoscopy;;   CARDIAC CATHETERIZATION     COLONOSCOPY     COLONOSCOPY WITH PROPOFOL N/A 12/20/2019   Procedure: COLONOSCOPY WITH PROPOFOL;  Surgeon: Milus Banister, MD;  Location: [facility];  Service: Endoscopy;  Laterality: N/A;   ESOPHAGOGASTRODUODENOSCOPY (EGD) WITH PROPOFOL N/A 12/20/2019   Procedure: ESOPHAGOGASTRODUODENOSCOPY (EGD) WITH PROPOFOL;  Surgeon: Milus Banister, MD;  Location: [facility];  Service: Endoscopy;  Laterality: N/A;   LEFT HEART CATH AND CORONARY ANGIOGRAPHY N/A 12/02/2016   Procedure: LEFT HEART CATH AND CORONARY ANGIOGRAPHY;  Surgeon: Jettie Booze, MD;  Location: [facility];  Service: Cardiovascular;  Laterality: N/A;   LUMBAR LAMINECTOMY/DECOMPRESSION MICRODISCECTOMY N/A 01/02/2014   Procedure: CENTRAL DECOMPRESSION LUMBAR LAMINECTOMY L3-L4, L4-L5;  Surgeon: Tobi Bastos, MD;  Location: [facility];  Service: Orthopedics;  Laterality: N/A;   neck fusion     POLYPECTOMY  12/20/2019   Procedure: POLYPECTOMY;  Surgeon: Milus Banister, MD;  Location: [facility];  Service: Endoscopy;;    His Family History Is Significant For: Family History  Problem Relation  Age of Onset   Colon cancer Mother    Diabetes Father        siblings  Heart disease Father        brother   Heart attack Father    Kidney disease Sister    Heart failure Sister    Drug abuse Sister    Heart disease Brother    Heart disease Brother    Deep vein thrombosis Brother    Heart attack Son 38   Colon polyps Neg Hx    Migraines Neg Hx     His Social History Is Significant For: Social History   Socioeconomic History   Marital status: Married    Spouse name: Butch Penny   Number of children: 3   Years of education: 8   Highest education level: GED or equivalent  Occupational History   Occupation: retired/disability 1999    Employer: DISABLED    Comment: Textiles  Tobacco Use   Smoking status: Former    Packs/day: 1.00    Types: Cigarettes    Start date: 02/09/1964    Quit date: 02/08/1969    Years since quitting: 52.9   Smokeless tobacco: Former    Quit date: 1971  Scientific laboratory technician Use: Never used  Substance and Sexual Activity   Alcohol use: No    Alcohol/week: 0.0 standard drinks of alcohol   Drug use: No   Sexual activity: Not Currently  Other Topics Concern   Not on file  Social History Narrative   Drinks caffeine tea occasionally   Lives with wife Butch Penny, daughter, and granddaughter    One son, Heath Lark passed away last year suddenly   Social Determinants of Health   Financial Resource Strain: Low Risk  (08/12/2021)   Overall Financial Resource Strain (CARDIA)    Difficulty of Paying Living Expenses: Not hard at all  Food Insecurity: No Food Insecurity (08/12/2021)   Hunger Vital Sign    Worried About Running Out of Food in the Last Year: Never true    [place] in the Last Year: Never true  Transportation Needs: No Transportation Needs (08/12/2021)   PRAPARE - Hydrologist (Medical): No    Lack of Transportation (Non-Medical): No  Physical Activity: Inactive (08/12/2021)   Exercise Vital Sign    Days of Exercise per  Week: 0 days    Minutes of Exercise per Session: 0 min  Stress: No Stress Concern Present (08/12/2021)   [place]    Feeling of Stress : Only a little  Recent Concern: Stress - Stress Concern Present (06/25/2021)   [place]    Feeling of Stress : To some extent  Social Connections: Socially Integrated (08/12/2021)   Social Connection and Isolation Panel [NHANES]    Frequency of Communication with Friends and Family: More than three times a week    Frequency of Social Gatherings with Friends and Family: More than three times a week    Attends Religious Services: More than 4 times per year    Active Member of [employer] or Organizations: Yes    Attends Music therapist: More than 4 times per year    Marital Status: Married    His Allergies Are:  Allergies  Allergen Reactions   Amlodipine Besy-Benazepril Hcl Swelling and Other (See Comments)    Makes tongue swell (lotrel)   Hydrocodone Itching    Can tolerate in low doses   Oxycodone Itching   Phenergan [Promethazine Hcl] Other (See Comments)    "I can't  remember."  :   His Current Medications Are:  Outpatient Encounter Medications as of 01/07/2022  Medication Sig   albuterol (PROVENTIL) (2.5 MG/3ML) 0.083% nebulizer solution USE 1 VIAL IN NEBULIZER EVERY 6 HOURS AS NEEDED FOR WHEEZING AND FOR SHORTNESS OF BREATH   albuterol (VENTOLIN HFA) 108 (90 Base) MCG/ACT inhaler Inhale 2 puffs into the lungs every 6 (six) hours as needed for wheezing or shortness of breath.   benzonatate (TESSALON) 200 MG capsule Take 1 capsule (200 mg total) by mouth 3 (three) times daily as needed for cough.   bisoprolol (ZEBETA) 5 MG tablet Take 1/2 (one-half) tablet by mouth once daily   Blood Glucose Monitoring Suppl ([place]) w/Device KIT Use to test blood sugar daily as directed. DX: E11.65    cetirizine (ZYRTEC) 10 MG tablet Take 1 tablet by mouth once daily   Cholecalciferol (VITAMIN D3) 125 MCG (5000 UT) TABS Take 5,000 Units by mouth daily.   cycloSPORINE (RESTASIS) 0.05 % ophthalmic emulsion Apply to eye.   dapagliflozin propanediol (FARXIGA) 10 MG TABS tablet Take 1 tablet (10 mg total) by mouth daily.   fluticasone (FLONASE) 50 MCG/ACT nasal spray Place 2 sprays into both nostrils daily.   furosemide (LASIX) 40 MG tablet Take 40 mg by mouth daily as needed (fluid retention.).   glucose blood (ONETOUCH VERIO) test strip Use to test blood sugar daily as directed. DX: E11.65   hydrALAZINE (APRESOLINE) 25 MG tablet Take 3 tablets (75 mg total) by mouth 3 (three) times daily.   hydrALAZINE (APRESOLINE) 50 MG tablet Take 1 tablet (50 mg total) by mouth 3 (three) times daily.   isosorbide dinitrate (ISORDIL) 20 MG tablet Take 1 tablet (20 mg total) by mouth 3 (three) times daily.   Lancets (ONETOUCH DELICA PLUS ZOXWRU04V) MISC Use to test blood sugar daily as directed. DX: E11.65   mirabegron ER (MYRBETRIQ) 25 MG TB24 tablet Take 1 tablet (25 mg total) by mouth daily.   olmesartan (BENICAR) 20 MG tablet Take 1 tablet (20 mg total) by mouth daily.   omeprazole (PRILOSEC) 40 MG capsule TAKE 1 CAPSULE BY MOUTH ONCE DAILY 30-60  MINUTES  PRIOR  TO  BREAKFAST   roflumilast (DALIRESP) 500 MCG TABS tablet Take 1 tablet (500 mcg total) by mouth daily.   Semaglutide,0.25 or 0.5MG/DOS, (OZEMPIC, 0.25 OR 0.5 MG/DOSE,) 2 MG/3ML SOPN Inject 0.5 mg into the skin once a week.   sertraline (ZOLOFT) 100 MG tablet Take 1 tablet by mouth once daily   spironolactone (ALDACTONE) 25 MG tablet Take 25 mg by mouth daily.   tamsulosin (FLOMAX) 0.4 MG CAPS capsule TAKE 1 CAPSULE BY MOUTH ONCE DAILY AFTER SUPPER   glimepiride (AMARYL) 4 MG tablet Take by mouth. (Patient not taking: Reported on 01/07/2022)   meclizine (ANTIVERT) 25 MG tablet Take 1 tablet (25 mg total) by mouth 3 (three) times daily as needed for  dizziness. (Patient not taking: Reported on 01/07/2022)   nitroGLYCERIN (NITROSTAT) 0.4 MG SL tablet Place 1 tablet (0.4 mg total) under the tongue every 5 (five) minutes x 3 doses as needed for chest pain. (Patient not taking: Reported on 04/17/2021)   [DISCONTINUED] amoxicillin-clavulanate (AUGMENTIN) 875-125 MG tablet Take 1 tablet by mouth 2 (two) times daily. (Patient not taking: Reported on 01/07/2022)   [DISCONTINUED] predniSONE (DELTASONE) 20 MG tablet 2 po at same time daily for 5 days (Patient not taking: Reported on 01/07/2022)   No facility-administered encounter medications on file as of 01/07/2022.  :  Review of Systems:  Out of a complete 14 point review of systems, all are reviewed and negative with the exception of these symptoms as listed below:       Review of Systems  Neurological:        Patient is here alone for consultation for headaches, ringing in the ears, and light sensitivity. He states he has had ringing in the ears forever and the headaches started about a month ago. He also reports photophobia and phonophobia. He has tried OTC remedies for the headache (ex. Ibuprofen 800 mg) but they do not work. He had an MRI that showed left maxillary sinusitis. He recently completed a course of prednisone and Augmentin. Patient also reports that although his headache is located on the right side (forehead and behind right eye), when he has a headache, he has swelling of the blood vessel on the left side and he states he can feel his heart beat when he touches it.     Objective:  Neurological Exam  Physical Exam Physical Examination:   Vitals:   01/07/22 1130  BP: 133/78  Pulse: 76    General Examination: The patient is a very pleasant 76 y.o. male in no acute distress. He appears well-developed and well-nourished and well groomed.   HEENT: Normocephalic, atraumatic, pupils are slightly unequal, with left pupil slightly smaller than right, not a new finding.  Mild  bilateral cataracts noted, corrective eyeglasses in place.  Funduscopic exam difficult secondary to cataracts. Face is symmetric with normal facial animation and normal hearing noted.  No carotid bruits, neck mobility is limited. Oropharynx exam reveals: mild mouth dryness, adequate dental hygiene and moderate to significant airway crowding with pharyngeal erythema and swollen uvula noted, tonsillar size of 2+, also erythematous bilaterally. Tongue protrudes centrally and palate elevates symmetrically.  No palpable cord left temple, no significant tenderness on palpation.   Chest: Clear to auscultation without wheezing, rhonchi or crackles noted.   Heart: S1+S2+0, regular and normal without murmurs, rubs or gallops noted.    Abdomen: Soft, non-tender and non-distended.   Extremities: There is no pitting edema in the distal lower extremities bilaterally.    Skin: Warm and dry without trophic changes noted.   Musculoskeletal: exam reveals no joint deformities.      Neurologically:  Mental status: The patient is awake, alert and oriented in all 4 spheres. His immediate and remote memory, attention, language skills and fund of knowledge are appropriate. There is no evidence of aphasia, agnosia, apraxia or anomia. Speech is clear with normal prosody and enunciation. Thought process is linear. Mood is normal and affect is normal.  Cranial nerves II - XII are as described above under HEENT exam.  Motor exam: Normal bulk, strength and tone is noted. There is no drift, or tremor or rebound. Reflexes are 1+ throughout. Babinski: Toes are flexor bilaterally. Fine motor skills are intact in the upper extremities.  Cerebellar testing: No dysmetria or intention tremor. There is no truncal or gait ataxia.  Sensory exam: intact to light touch in the upper and lower extremities.  Gait, station and balance: He stands easily. No veering to one side is noted. No leaning to one side is noted. Posture is  age-appropriate and stance is narrow based. Gait shows normal stride length and normal pace. No problems turning are noted.    Assessment & Plan:  In summary, RIOT WATERWORTH is a very pleasant 76 year old male with an underlying complex medical history of COPD, coronary artery disease,  low back pain, history of C1 fracture, status post neck surgery, rotator cuff surgery, prior congestive heart failure, diabetes, hypertension, hyperlipidemia, neuropathy, obesity, sleep apnea, allergies, asthma, fibromyalgia, and obesity, who presents for evaluation of his recurrent headaches of approximately 1 months' duration.  He has a prior history of recurrent right-sided headaches.  History and examination are not compelling for migraine headaches.  He had a recent brain MRI without any obvious underlying structural cause.  He was treated for sinusitis recently.  I do think he has a mixed type of headache, my primary concern is untreated sleep apnea, he reports a diagnosis of rather severe sleep apnea and has not been using his BiPAP machine for the past 3 months.  It would correlate also as far as timeline.  He is advised strongly to get back on treatment with his BiPAP machine as long as he is still waiting to get [employer].  He is advised to avoid taking ibuprofen daily, particularly high doses, ibuprofen can cause high blood pressure, kidney damage, and also rebound and overuse headaches.  I think there is a component of that as well.  He is advised to stay well-hydrated and continue to avoid caffeine.  He can use Tylenol as needed.  He is advised to get a formal eye examination done, since he has had some eye pain on the left side behind the eye, we will go ahead and do some blood work for inflammatory markers including CRP and ESR, he has had some tenderness in the left temple although that the headache is on the right side.  Unlikely to be temporal arteritis for that reason but worth checking labs.  He is advised to  keep his appointment with his nephrologist, recent sinusitis may also be a contributor for his headaches.  Blood pressure surges probably also a component.  At this juncture, I would recommend he follow-up with your office closely for blood pressure management, he is advised to keep his appointment with the nephrologist and make an appointment with his eye doctor, we will call him with his blood test results, he is advised to get back on treatment with his BiPAP machine.  I can see him back as needed.  I answered all his questions today and he was in agreement. I spent 40 minutes in total face-to-face time and in reviewing records during pre-charting, more than 50% of which was spent in counseling and coordination of care, reviewing test results, reviewing medications and treatment regimen and/or in discussing or reviewing the diagnosis of recurrent and mixed headaches, the prognosis and treatment options. Pertinent laboratory and imaging test results that were available during this visit with the patient were reviewed by me and considered in my medical decision making (see chart for details).

## 2022-01-08 LAB — C-REACTIVE PROTEIN: CRP: 5 mg/L (ref 0–10)

## 2022-01-08 LAB — SEDIMENTATION RATE: Sed Rate: 14 mm/hr (ref 0–30)

## 2022-01-18 ENCOUNTER — Institutional Professional Consult (permissible substitution): Payer: PPO | Admitting: Neurology

## 2022-01-21 DIAGNOSIS — M545 Low back pain, unspecified: Secondary | ICD-10-CM | POA: Diagnosis not present

## 2022-01-21 DIAGNOSIS — M542 Cervicalgia: Secondary | ICD-10-CM | POA: Diagnosis not present

## 2022-01-22 ENCOUNTER — Other Ambulatory Visit: Payer: Self-pay | Admitting: Family Medicine

## 2022-01-22 DIAGNOSIS — R350 Frequency of micturition: Secondary | ICD-10-CM

## 2022-01-22 DIAGNOSIS — R3915 Urgency of urination: Secondary | ICD-10-CM

## 2022-01-29 DIAGNOSIS — I5032 Chronic diastolic (congestive) heart failure: Secondary | ICD-10-CM | POA: Diagnosis not present

## 2022-01-29 DIAGNOSIS — G4733 Obstructive sleep apnea (adult) (pediatric): Secondary | ICD-10-CM | POA: Diagnosis not present

## 2022-01-29 DIAGNOSIS — J449 Chronic obstructive pulmonary disease, unspecified: Secondary | ICD-10-CM | POA: Diagnosis not present

## 2022-01-29 DIAGNOSIS — R0902 Hypoxemia: Secondary | ICD-10-CM | POA: Diagnosis not present

## 2022-02-03 DIAGNOSIS — R809 Proteinuria, unspecified: Secondary | ICD-10-CM | POA: Diagnosis not present

## 2022-02-03 DIAGNOSIS — E559 Vitamin D deficiency, unspecified: Secondary | ICD-10-CM | POA: Diagnosis not present

## 2022-02-03 DIAGNOSIS — Z6836 Body mass index (BMI) 36.0-36.9, adult: Secondary | ICD-10-CM | POA: Diagnosis not present

## 2022-02-03 DIAGNOSIS — I5042 Chronic combined systolic (congestive) and diastolic (congestive) heart failure: Secondary | ICD-10-CM | POA: Diagnosis not present

## 2022-02-03 DIAGNOSIS — E1129 Type 2 diabetes mellitus with other diabetic kidney complication: Secondary | ICD-10-CM | POA: Diagnosis not present

## 2022-02-03 DIAGNOSIS — I129 Hypertensive chronic kidney disease with stage 1 through stage 4 chronic kidney disease, or unspecified chronic kidney disease: Secondary | ICD-10-CM | POA: Diagnosis not present

## 2022-02-03 DIAGNOSIS — N189 Chronic kidney disease, unspecified: Secondary | ICD-10-CM | POA: Diagnosis not present

## 2022-02-03 DIAGNOSIS — E1122 Type 2 diabetes mellitus with diabetic chronic kidney disease: Secondary | ICD-10-CM | POA: Diagnosis not present

## 2022-02-05 ENCOUNTER — Ambulatory Visit: Payer: PPO | Admitting: Family Medicine

## 2022-02-06 DIAGNOSIS — M5459 Other low back pain: Secondary | ICD-10-CM | POA: Diagnosis not present

## 2022-02-07 DIAGNOSIS — R0902 Hypoxemia: Secondary | ICD-10-CM | POA: Diagnosis not present

## 2022-02-07 DIAGNOSIS — I5032 Chronic diastolic (congestive) heart failure: Secondary | ICD-10-CM | POA: Diagnosis not present

## 2022-02-07 DIAGNOSIS — G4733 Obstructive sleep apnea (adult) (pediatric): Secondary | ICD-10-CM | POA: Diagnosis not present

## 2022-02-07 DIAGNOSIS — J449 Chronic obstructive pulmonary disease, unspecified: Secondary | ICD-10-CM | POA: Diagnosis not present

## 2022-02-08 ENCOUNTER — Encounter: Payer: Self-pay | Admitting: Neurology

## 2022-02-09 ENCOUNTER — Institutional Professional Consult (permissible substitution): Payer: PPO | Admitting: Neurology

## 2022-02-10 DIAGNOSIS — M5459 Other low back pain: Secondary | ICD-10-CM | POA: Diagnosis not present

## 2022-02-16 DIAGNOSIS — M5416 Radiculopathy, lumbar region: Secondary | ICD-10-CM | POA: Diagnosis not present

## 2022-03-01 ENCOUNTER — Telehealth: Payer: Self-pay

## 2022-03-01 DIAGNOSIS — G4733 Obstructive sleep apnea (adult) (pediatric): Secondary | ICD-10-CM | POA: Diagnosis not present

## 2022-03-01 DIAGNOSIS — M5459 Other low back pain: Secondary | ICD-10-CM | POA: Diagnosis not present

## 2022-03-01 DIAGNOSIS — I5032 Chronic diastolic (congestive) heart failure: Secondary | ICD-10-CM | POA: Diagnosis not present

## 2022-03-01 DIAGNOSIS — J449 Chronic obstructive pulmonary disease, unspecified: Secondary | ICD-10-CM | POA: Diagnosis not present

## 2022-03-01 DIAGNOSIS — R0902 Hypoxemia: Secondary | ICD-10-CM | POA: Diagnosis not present

## 2022-03-01 NOTE — Telephone Encounter (Signed)
Called [employer] to follow up on enrollment for 2024.  [employer]tri and [place] approved until 02/08/23.  Both shipments processing. [name] has one refill remaining and needs new rx sent.  [employer] enrollment pending re-enrollment. New rx can be sent in meantime while this processes.

## 2022-03-01 NOTE — Telephone Encounter (Signed)
*  correction D[employer] no longer on the program!*

## 2022-03-02 NOTE — Telephone Encounter (Signed)
Would send it to [facility] and have patient use this coupon (now generic) I will print and leave up front--please let patient know There is no patient assistance since it went generic

## 2022-03-08 DIAGNOSIS — M5416 Radiculopathy, lumbar region: Secondary | ICD-10-CM | POA: Diagnosis not present

## 2022-03-10 DIAGNOSIS — R0902 Hypoxemia: Secondary | ICD-10-CM | POA: Diagnosis not present

## 2022-03-10 DIAGNOSIS — G4733 Obstructive sleep apnea (adult) (pediatric): Secondary | ICD-10-CM | POA: Diagnosis not present

## 2022-03-10 DIAGNOSIS — J449 Chronic obstructive pulmonary disease, unspecified: Secondary | ICD-10-CM | POA: Diagnosis not present

## 2022-03-10 DIAGNOSIS — I5032 Chronic diastolic (congestive) heart failure: Secondary | ICD-10-CM | POA: Diagnosis not present

## 2022-03-11 DIAGNOSIS — M5416 Radiculopathy, lumbar region: Secondary | ICD-10-CM | POA: Diagnosis not present

## 2022-03-16 DIAGNOSIS — R0902 Hypoxemia: Secondary | ICD-10-CM | POA: Diagnosis not present

## 2022-03-16 DIAGNOSIS — I5032 Chronic diastolic (congestive) heart failure: Secondary | ICD-10-CM | POA: Diagnosis not present

## 2022-03-16 DIAGNOSIS — G4733 Obstructive sleep apnea (adult) (pediatric): Secondary | ICD-10-CM | POA: Diagnosis not present

## 2022-03-16 DIAGNOSIS — J449 Chronic obstructive pulmonary disease, unspecified: Secondary | ICD-10-CM | POA: Diagnosis not present

## 2022-03-23 ENCOUNTER — Other Ambulatory Visit: Payer: Self-pay | Admitting: Family Medicine

## 2022-03-23 DIAGNOSIS — F339 Major depressive disorder, recurrent, unspecified: Secondary | ICD-10-CM

## 2022-04-08 DIAGNOSIS — M48062 Spinal stenosis, lumbar region with neurogenic claudication: Secondary | ICD-10-CM | POA: Diagnosis not present

## 2022-04-14 DIAGNOSIS — R0902 Hypoxemia: Secondary | ICD-10-CM | POA: Diagnosis not present

## 2022-04-14 DIAGNOSIS — I5032 Chronic diastolic (congestive) heart failure: Secondary | ICD-10-CM | POA: Diagnosis not present

## 2022-04-14 DIAGNOSIS — J449 Chronic obstructive pulmonary disease, unspecified: Secondary | ICD-10-CM | POA: Diagnosis not present

## 2022-04-14 DIAGNOSIS — G4733 Obstructive sleep apnea (adult) (pediatric): Secondary | ICD-10-CM | POA: Diagnosis not present

## 2022-04-20 ENCOUNTER — Telehealth: Payer: Self-pay | Admitting: Pulmonary Disease

## 2022-04-20 ENCOUNTER — Other Ambulatory Visit: Payer: Self-pay | Admitting: Family Medicine

## 2022-04-20 DIAGNOSIS — F339 Major depressive disorder, recurrent, unspecified: Secondary | ICD-10-CM

## 2022-04-20 NOTE — Telephone Encounter (Signed)
  Prescription Request  04/20/2022  Is this a "Controlled Substance" medicine? NO  Have you seen your PCP in the last 2 weeks? no  If YES, route message to pool  -  If NO, patient needs to be scheduled for appointment.  What is the name of the medication or equipment? sertraline (ZOLOFT) 100 MG tablet   Have you contacted your pharmacy to request a refill? yes   Which pharmacy would you like this sent to? [facility], pt has appt on 05/31/22 with Dr. Keturah Barre.    Patient notified that their request is being sent to the clinical staff for review and that they should receive a response within 2 business days.

## 2022-04-20 NOTE — Telephone Encounter (Signed)
Patient states needs authorization for oxygen. Patient uses [place] for oxygen. Patient phone number is [phone].

## 2022-04-20 NOTE — Telephone Encounter (Signed)
Called and spoke to patient and he says his insurance is denying coverage of O2 now.  He says he uses it PRN during the day and at night time.  Called [facility] and spoke to [place] and she said patients file is up to date and is showing he doesn't need a new authorization. She said if he has any more questions he can call and talk to Jacobo Forest.   Called and relayed this to patient and advised to call back if he needed anything from [place].

## 2022-04-21 DIAGNOSIS — M5416 Radiculopathy, lumbar region: Secondary | ICD-10-CM | POA: Diagnosis not present

## 2022-04-21 DIAGNOSIS — M542 Cervicalgia: Secondary | ICD-10-CM | POA: Diagnosis not present

## 2022-04-30 IMAGING — DX DG CHEST 2V
2 series · 2 of 2 positions shown · non-contrast
Comparison: 06/03/2019

CLINICAL DATA: Chest tightness, COPD exacerbation

EXAM:
CHEST - 2 VIEW

[chest pa]
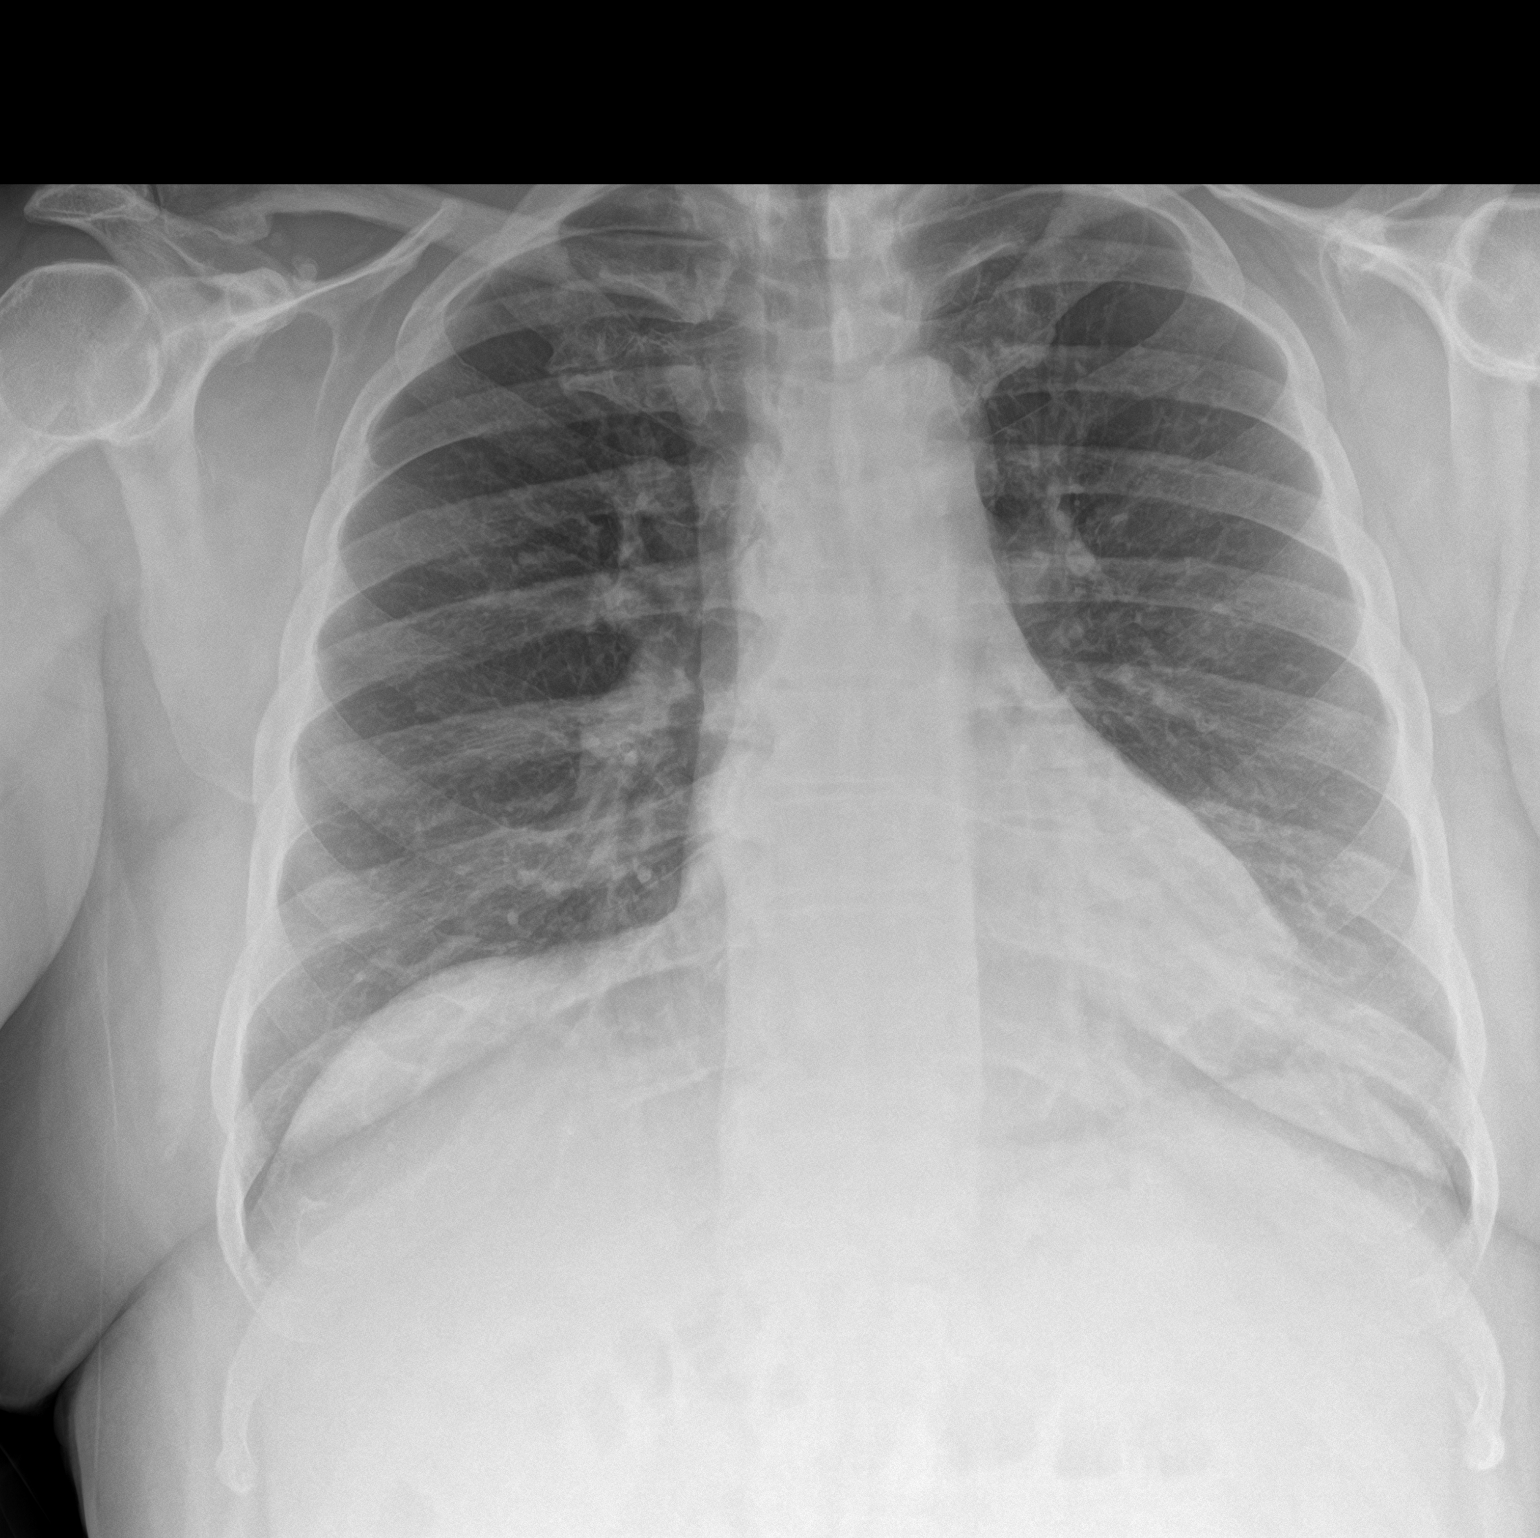

[chest lat]
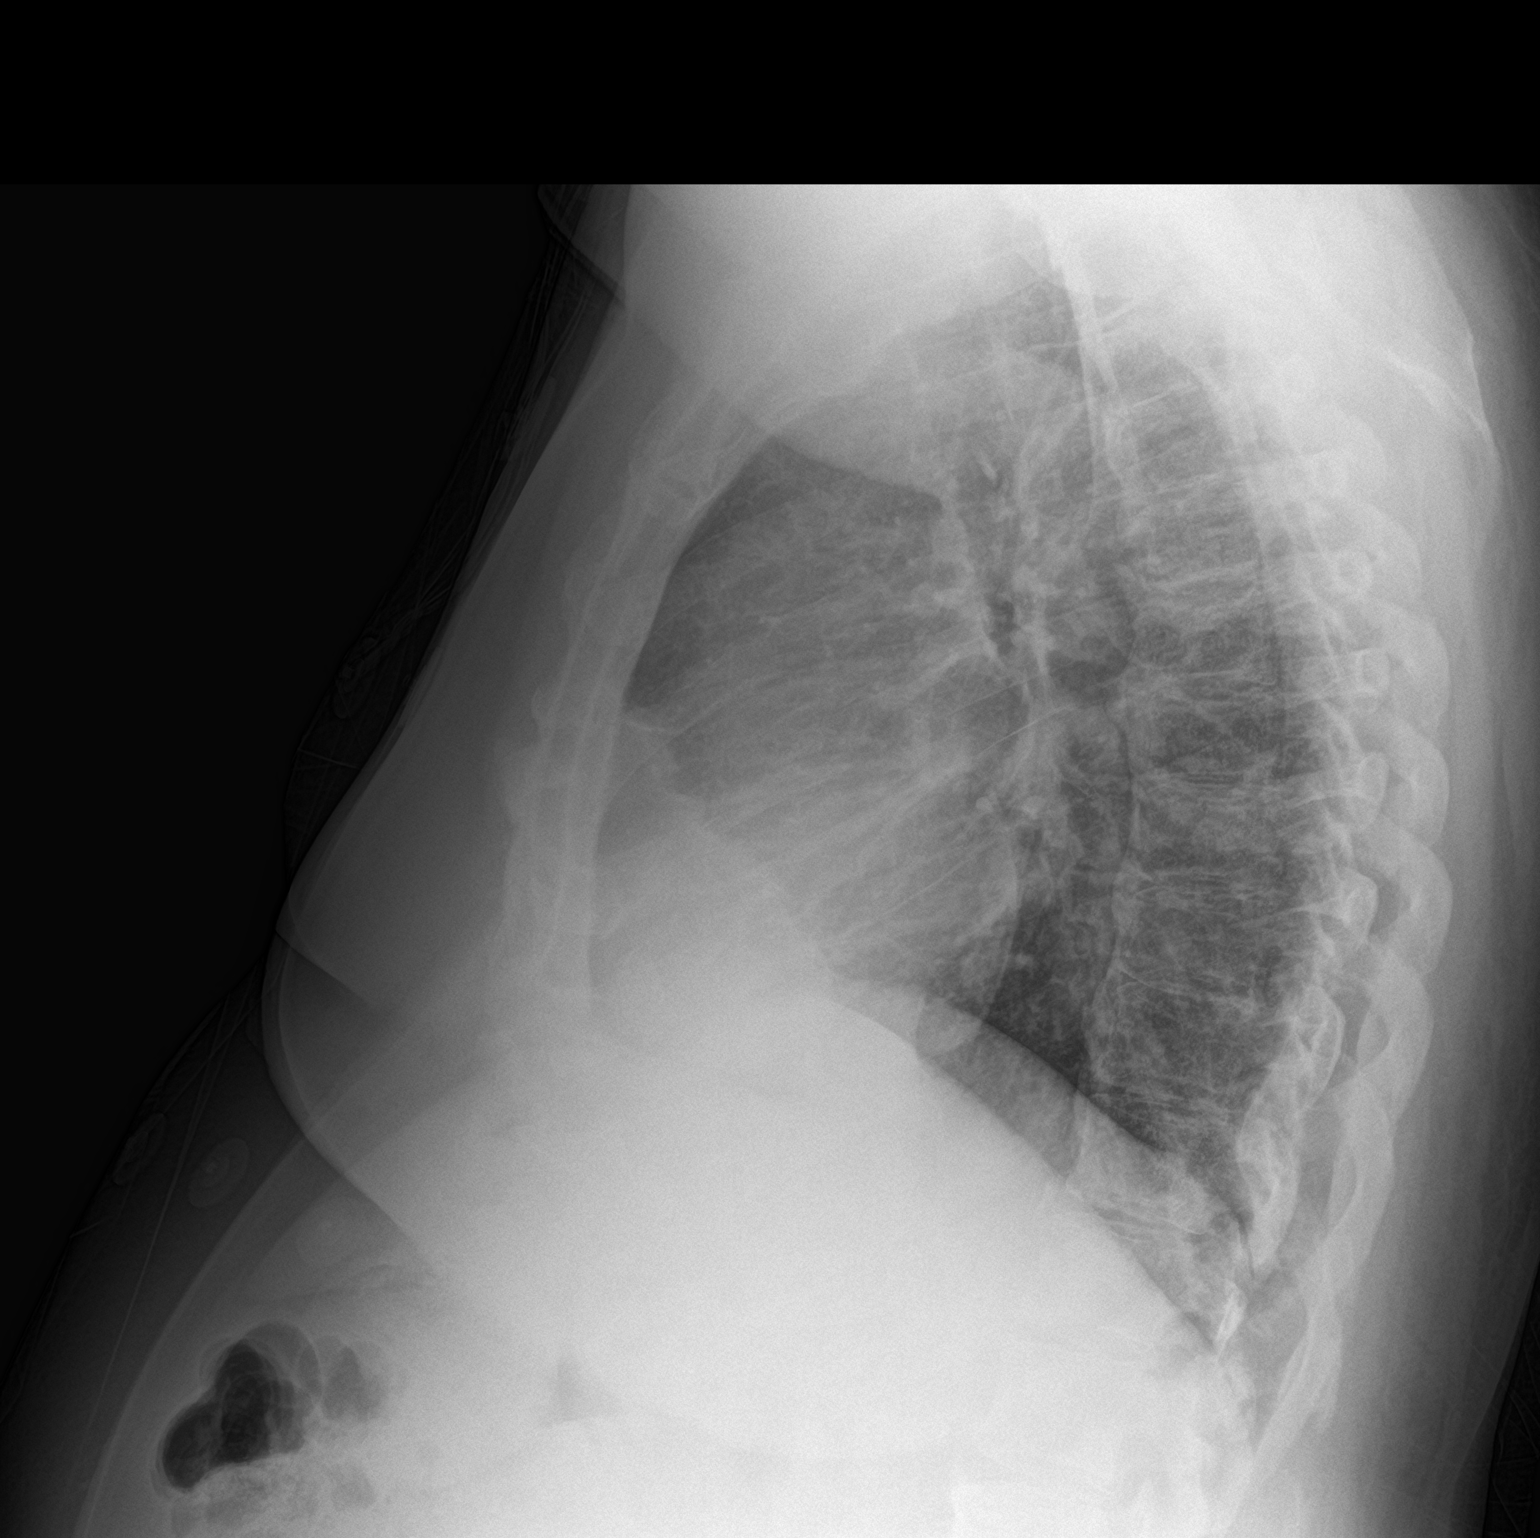

[2 of 2 positions shown; findings below may reference images not displayed]

FINDINGS: Frontal and lateral views of the chest demonstrate a stable cardiac
silhouette. No airspace disease, effusion, or pneumothorax. No acute
bony abnormalities.
IMPRESSION: 1. Stable exam, no acute process.

## 2022-05-04 DIAGNOSIS — M542 Cervicalgia: Secondary | ICD-10-CM | POA: Diagnosis not present

## 2022-05-12 ENCOUNTER — Ambulatory Visit (INDEPENDENT_AMBULATORY_CARE_PROVIDER_SITE_OTHER): Payer: PPO | Admitting: Neurology

## 2022-05-12 VITALS — BP 131/72 | HR 72 | Ht 67.0 in | Wt 224.0 lb

## 2022-05-12 DIAGNOSIS — Z79899 Other long term (current) drug therapy: Secondary | ICD-10-CM

## 2022-05-12 DIAGNOSIS — M5451 Vertebrogenic low back pain: Secondary | ICD-10-CM | POA: Diagnosis not present

## 2022-05-12 DIAGNOSIS — R2689 Other abnormalities of gait and mobility: Secondary | ICD-10-CM | POA: Diagnosis not present

## 2022-05-12 NOTE — Patient Instructions (Addendum)
I believe you have a multifactorial gait disorder, meaning, that it is Wyn Quaker is due to a combination of factors. 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  In particular, medications that could affect your balance include clonazepam, meclizine, hydrocodone, and Myrbetriq, even the sertraline can affect the balance in certain patients.  You indicate that you no longer take gabapentin or tramadol.  I recommend you follow-up with your pain management doctor and your primary care and other providers to have your medication list critically low.  I did not see any signs of parkinsonism on exam.  Please remember to stand up slowly and get your bearings first turn slowly, no bending down to pick anything, no heavy lifting, be extra careful at night and first thing in the morning. Also, be careful in the Bathroom and the kitchen.  I do recommend you start using a walker for gait safety.  Having fallen is also a risk factor for falls in the future.  Remember to drink plenty of fluid, eat healthy meals and do not skip any meals. Try to eat protein with a every meal and eat a healthy snack such as fruit or nuts or yogurt in between meals. Try to keep a regular sleep-wake schedule and try to exercise daily, particularly in the form of walking, 20-30 minutes a day, if you can, even in shorter increments.   As far as your medications are concerned, I would like to suggest no new medications.   As far as diagnostic testing: I do not believe you need any additional test from my end of things at this time.  You had a recent brain MRI and also recent CT of the head.  Your history does not suggest any stroke.

## 2022-05-12 NOTE — Progress Notes (Signed)
Subjective:    Patient ID: Kenneth Fuller is a 77 y.o. male.  HPI    Star Age, MD, PhD [facility] [address], [address] [place], [place] [address]  Dear Dr. Rolena Infante,  I saw your patient, Kenneth Fuller, upon your kind request in my neurologic clinic today for initial consultation of his balance problems.  The patient is unaccompanied today.  As you know, [name] is a 77 year old male with an underlying complex medical history of asthma, allergies, coronary artery disease, congestive heart failure, COPD, fibromyalgia, diabetes, reflux disease, gout, hypertension, hyperlipidemia, hypogonadism, degenerative neck disease, status post neck surgery, arthritis of the left knee, lumbar radiculopathy, status post back surgery, history of sleep apnea (currently not on PAP therapy), history of neuropathy, and obesity, who reports a several month history of balance issues and at times staggering when walking.  He admits to low back pain which radiates typically to the right leg.  He favors the right leg and puts more pressure on the left but has had left knee problems as well.  He had a recent x-ray to the left knee.  He has fallen.  He bruises easily, does not take any baby aspirin or any blood thinner.  He fell a few days ago at home, he was sitting at the desk and slid off his chair, did not hurt himself but did sustain a bruise on the left wrist.   He has started using a cane but feels like he is not ready for a walker yet.    He hydrates well with water, estimates that he drinks about 5 or 6 bottles of water per day, 16.9 ounce size each.  He does not drink any alcohol.  He does not drink caffeine daily.    Of note, he takes multiple medications including high risk medications.  He was not 100% sure about all his medications on the list.  He does not take any gabapentin any longer, no more meclizine or tramadol, he does take sertraline, clonazepam,  hydrocodone as needed up to 2-3 times a day, and Myrbetriq.of note, he no longer uses his PAP machine for sleep apnea.  He has followed with pulmonology for sleep.   I reviewed your office note from 04/21/2022.  He had a lumbar spine MRI through [facility] which showed mild broad levoscoliosis curvature of the lumbar spine, slight anterior listhesis L4-5 with prior laminectomy, and partial facetectomy's.  Mild foraminal stenosis at L1-2, moderate central stenosis at 2-3, with a notation of ablation of the CSF space.  Moderate to severe disc space narrowing at L 3-4 with slight retrolisthesis.  Moderate to severe foraminal stenosis but no impingement of the exiting L3 nerve root.  Moderate to severe by foraminal stenosis with impingement at L4-5.  Mild degenerative stenosis L5-S1 with more advanced collapse of the disc space.  Of note, he had a right L4 nerve block.  He has a history of cervical fusion as well.  He was scheduled for a cervical spine MRI.  He was advised to start using a walker which he reportedly declined, he was advised to start using a cane and a prescription was given for cane.  I had evaluated him a few months ago for recurrent headaches.  He was advised to get back on using his BiPAP for sleep apnea, he was advised to continue to work on weight loss and discontinue using high-dose ibuprofen for fear of rebound headaches and medication overuse headaches.  He had a brain MRI without contrast through [facility] on 12/25/2021 which showed moderate chronic microvascular ischemic changes and left maxillary sinus disease.  Previously:  01/07/2022:  77 year old male with an underlying complex medical history of COPD, coronary artery disease, congestive heart failure, diabetes, hypertension, hyperlipidemia, neuropathy, obesity, sleep apnea, allergies, asthma, fibromyalgia, and obesity, who reports an approximately 1 month history of recurrent right-sided headaches.  He reports light  sensitivity and pain behind the left eye, sometimes a blood vessel on the left side of his temple seems swollen, somewhat tender to touch but not very tender to touch.  He had an eye examination earlier this year.  He had new eyeglasses, prescription did not change much. He recently finished a course of antibiotics and steroids for sinus infection. Of note, he has a history of severe sleep apnea and is currently not being treated for it, he stopped using his BiPAP and previously was on CPAP.  He reports that he is pursuing inspire, he needs to lose weight before inspire can be implanted, he is working on weight loss, he is currently on Ozempic.  However, he has not used his BiPAP in the past 3 months. Of note is also high dose ibuprofen, 800 mg 3 times daily, he has no prescription strength ibuprofen, takes 200 mg pills, 4 pills at a time, he has taken a turn milligrams 3 times daily for the past 10 to 14 days.  He reports chronic kidney issues and in fact has an appointment with his kidney doctor next week.   I reviewed your office note from 12/03/2021. He had presented to the emergency room at [facility] on 11/30/2021 with headaches.  He reported a consistently high blood pressure over the past couple of weeks.  He reported intermittent blurry vision.  His blood pressure in the emergency room record was 157/68.  He was treated symptomatically for his headache with Reglan, Benadryl, Decadron, Toradol, and received hydralazine.  He was felt to have headache primarily secondary to hypertension.   He had a recent brain MRI without contrast on 12/25/2021 and I reviewed the results: IMPRESSION: 1. No acute intracranial abnormality. 2. Sequela of moderate chronic microvascular ischemic change. 3. Left maxillary sinus disease.    He tries to hydrate well, he has reduced his caffeine intake and drinks 1 cup of decaf coffee per day on average.   Has had right-sided headaches in the past. 03/14/19:  77 year old right-handed gentleman with an underlying complex medical history of hypertension, hyperlipidemia, reflux disease, diabetes, CHF, coronary artery disease, sleep apnea, asthma, allergies, history of neuropathy, history of MRSA cellulitis, hypogonadism, low back pain, history of C1 fracture, status post neck surgery, rotator cuff surgery, prior head injury and fall with injuries and morbid obesity with a BMI of over 40, who reports recurrent headaches for the past 2 months.  He does not have a history of migraines.  He had a prescription for Maxalt as needed from Dr. Warrick Parisian.  He felt it was marginally helpful.  He has been taking ibuprofen, nearly daily, he has nearly daily headaches, sometimes he has nausea, he has seen some floaters in his visual field in both eyes.  He had an eye examination with Dr. Marin Comment his optometrist and was told he has age-related findings.  Of note, he has severe obstructive sleep apnea and is currently not using his CPAP consistently.  He feels dry mouth which is bothersome.  He has not had a follow-up with his sleep  specialist, he used to see Dr. Claiborne Billings for this.  I was able to review his split-night sleep study from October 2017, it looks like he had severe obstructive sleep apnea with an AHI of 103/h at baseline.  He reports that his CPAP pressure is at 14.  He estimates that he uses his CPAP maybe twice a week.  He reports no significant morning headaches but has sleep disruption from nocturia which can be once or twice per night or more.  He does not drink caffeine on a day-to-day basis.  He tries to hydrate well with water.  He does use oxygen at night.  He denies any sudden onset of one-sided weakness or numbness or tingling or droopy face or slurring of speech.  Headaches are bilateral, sometimes in the front and sometimes in the back.   I have previously seen him for headaches and balance problems with status post head injury in November 2017.   I reviewed your  office note from 01/24/2019.  He has been on tramadol as needed and had a oral prednisone course.  He presented to the emergency room on 01/19/2019 and reported a fall backwards hitting his head on the back about 2-1/2 weeks prior.  I reviewed the emergency room records. He had a head CT without contrast as well as cervical spine CT without contrast on 01/19/2019 and I reviewed the results: IMPRESSION: 1. No acute intracranial abnormality. No skull fracture. 2. No acute fracture or subluxation of the cervical spine. Anterior fusion hardware from C4 through C7. 3. Previous lucencies within the posterior arch of C1 are no longer seen, likely healed fractures.   Of note, he is on multiple medications.  He is on clonazepam at night.  He was recently advised to stop taking Ambien CR.     05/19/2016: 77 year old right-handed gentleman with an underlying complex medical history of morbid obesity, Hx of neck surgery and low back surgery, GERD, OSA on CPAP, HTN, neck injury secondary to fall in November 2017, during which he sustained a C1 fracture, and left rotator cuff injury, who presents for follow-up consultation of his headaches and balance issues with status post head injury in November 2017. The patient is unaccompanied today.  He reports doing better overall, but has tingling in both arms and hands. This, he feels is progressive. He reports a neck brace for about 12 weeks. After he had his latest neck CT which we reviewed today he was able to take the neck brace off per Dr. Rolena Infante instructions. He is worried about having problems with his neck again. He had his prior neck surgery under Dr. Saintclair Halsted with great results several years ago, could be 10 years ago. He saw Dr. Gladstone Lighter for LBP. He reports no recurrent headaches or lightheadedness or new balance problems, has been active but develops tingling in both shoulder areas and radiating to both arms and hands with even normal day-to-day activities such as  reaching. He has had no falls. Diabetes control is good. He has a routine follow-up with his primary care physician next month.  I first met him on 02/19/2016 at the request of his neurosurgeon, at which time the patient reported residual headaches and balance issues after he he had sustained a head injury secondary to accidental fall at home which resulted in C1 fracture. I suggested we proceed with a brain MRI and EEG. I also asked patient to refrain from taking any potentially sedating medications and use pain medication sparingly. He had a brain  MRI without contrast on 02/24/2016 which showed: IMPRESSION:  Abnormal MRI brain showing mild changes of chronic microvascular ischemia and cortical atrophy. Mild paranasal chronic sinusitis changes. Large disc protusion at C 3-4 with cord compression and post operative changes of anterior cervical fusion at C 4 and below. Recomend MRI C Spine if clinically indicated   We called him with his test results.   He had an EEG on 03/17/2016 which showed: Impression: This is a normal EEG recording in the waking state. No evidence of ictal or interictal discharges are seen.   We called him with his test results.     02/19/2016: He reports residual headaches and balace issues, post head injury on 12/26/15. He had LOC for some time and was calling out for his wife, but she could not hear him. He had gotten up to use the bathroom around 3 AM. He had not made enough light, she reports, and he did not like to turn on lights at night, but has been using a night light since the accidental fall. He has a cane and a walker from before, from when he had low back surgery.  I reviewed your office note from 02/18/15, which you kindly included. He was treated for his C1 fracture conservatively with a collar. He was admitted on 12/26/2015 after a fall in the bathtub at home. He was discharged on 12/27/2015. He had a head CT without contrast as well as a C-spine CT without contrast  on 12/26/2015 which I reviewed: IMPRESSION: CT BRAIN:   1. No acute intracranial process. 2. Small scalp contusion at the right frontal vertex. 3. Mild age-related cerebral atrophy with chronic small vessel ischemic disease.   CT CERVICAL SPINE:   1. Linear lucencies traversing the posterior ring of C1 bilaterally. While these are favored to be chronic in nature, these are somewhat age indeterminate, with possible acute fractures not entirely excluded. Further evaluation with MRI is recommended to assess the chronicity of this finding. 2. No other acute traumatic injury within the cervical spine. 3. Status post ACDF at C4 through C7 without complication. He had a cervical spine MRI without contrast on 12/26/2015 which I reviewed: IMPRESSION: 1. A definite C1 fracture is not identified on the MRI but I believe the findings on the CT scan are real and there is abnormal prevertebral fluid and hemorrhage which would suggest a fracture. 2. Central disc protrusion at C3-4 with mild mass effect on the ventral thecal sac and mild bilateral foraminal encroachment. 3. Solid fusion changes from [id number] without complicating features. 4. Normal appearance of the cervical spinal cord. He had a repeat CT cervical spine without contrast on 02/06/2016 which I reviewed: IMPRESSION: 1. Linear lucency through the inferior margin of the right and left posterior arches of C1 which are unchanged compared with 12/26/2015 and may reflect incomplete ununited nondisplaced fractures versus vascular foramina. No significant interval change compared with 12/26/2015. 2. Anterior cervical disc fusion from C4 through C7.   As I understand, he will be using his hard neck collar for at least 6 more weeks. He has not been driving and has been advised not to drive secondary to his neck fracture and using a hard collar. He has not had any episodes of confusion or convulsions. He does not have any one-sided weakness or  numbness. He does have some tingling in the right shoulder area and proximal right arm area. When he fell he initially was not able to move either arm or leg he  remembers. He has a history of obstructive sleep apnea and has been trying to use his CPAP. He tries to sleep in the recliner for more comfort and ease of use with his CPAP. It is difficult for him to use his CPAP currently. He has intermittent right parietal headaches which are short-lived, sometimes just a few minutes, as long as 5 or 10 minutes at most. They are moderately severe but not long enough for him to take any medication. Of note, he is not on any narcotic pain medication and takes as needed tramadol which he has been taking sparingly. He has not had any visual symptoms or speech impairment, memory seems stable and adequate. His Past Medical History Is Significant For: Past Medical History:  Diagnosis Date   Allergy    Asthma    CAD (coronary artery disease)    CHF (congestive heart failure) (HCC)    COPD (chronic obstructive pulmonary disease) (HCC)    Diabetes mellitus    Fibromyalgia    GERD (gastroesophageal reflux disease)    GI bleeding    Gout    Hyperlipidemia    Hypertension    Hypogonadism male    Insomnia    MRSA cellulitis    Neuropathy    Obesity    Shortness of breath dyspnea    with exertion    Sleep apnea    cpap- 15    Wheezing    no asthma diagnosis    His Past Surgical History Is Significant For: Past Surgical History:  Procedure Laterality Date   BACK SURGERY     BIOPSY  12/20/2019   Procedure: BIOPSY;  Surgeon: Milus Banister, MD;  Location: [facility];  Service: Endoscopy;;   CARDIAC CATHETERIZATION     COLONOSCOPY     COLONOSCOPY WITH PROPOFOL N/A 12/20/2019   Procedure: COLONOSCOPY WITH PROPOFOL;  Surgeon: Milus Banister, MD;  Location: [facility];  Service: Endoscopy;  Laterality: N/A;   ESOPHAGOGASTRODUODENOSCOPY (EGD) WITH PROPOFOL N/A 12/20/2019   Procedure:  ESOPHAGOGASTRODUODENOSCOPY (EGD) WITH PROPOFOL;  Surgeon: Milus Banister, MD;  Location: [facility];  Service: Endoscopy;  Laterality: N/A;   LEFT HEART CATH AND CORONARY ANGIOGRAPHY N/A 12/02/2016   Procedure: LEFT HEART CATH AND CORONARY ANGIOGRAPHY;  Surgeon: Jettie Booze, MD;  Location: [place] [facility];  Service: Cardiovascular;  Laterality: N/A;   LUMBAR LAMINECTOMY/DECOMPRESSION MICRODISCECTOMY N/A 01/02/2014   Procedure: CENTRAL DECOMPRESSION LUMBAR LAMINECTOMY L3-L4, L4-L5;  Surgeon: Tobi Bastos, MD;  Location: [facility];  Service: Orthopedics;  Laterality: N/A;   neck fusion     POLYPECTOMY  12/20/2019   Procedure: POLYPECTOMY;  Surgeon: Milus Banister, MD;  Location: [facility];  Service: Endoscopy;;    His Family History Is Significant For: Family History  Problem Relation Age of Onset   Colon cancer Mother    Diabetes Father        siblings   Heart disease Father        brother   Heart attack Father    Kidney disease Sister    Heart failure Sister    Drug abuse Sister    Heart disease Brother    Heart disease Brother    Deep vein thrombosis Brother    Heart attack Son 31   Colon polyps Neg Hx    Migraines Neg Hx     His Social History Is Significant For: Social History   Socioeconomic History   Marital status: Married    Spouse name: Butch Penny  Number of children: 3   Years of education: 8   Highest education level: GED or equivalent  Occupational History   Occupation: retired/disability 1999    Employer: DISABLED    Comment: Textiles  Tobacco Use   Smoking status: Former    Packs/day: 1    Types: Cigarettes    Start date: 02/09/1964    Quit date: 02/08/1969    Years since quitting: 53.2   Smokeless tobacco: Former    Quit date: 1971  Scientific laboratory technician Use: Never used  Substance and Sexual Activity   Alcohol use: No    Alcohol/week: 0.0 standard drinks of alcohol   Drug use: No   Sexual activity: Not Currently  Other Topics  Concern   Not on file  Social History Narrative   Drinks caffeine tea occasionally   Lives with wife Butch Penny, daughter, and granddaughter    One son, Heath Lark passed away last year suddenly   Social Determinants of Health   Financial Resource Strain: Low Risk  (08/12/2021)   Overall Financial Resource Strain (CARDIA)    Difficulty of Paying Living Expenses: Not hard at all  Food Insecurity: No Food Insecurity (08/12/2021)   Hunger Vital Sign    Worried About Running Out of Food in the Last Year: Never true    [place] in the Last Year: Never true  Transportation Needs: No Transportation Needs (08/12/2021)   PRAPARE - Hydrologist (Medical): No    Lack of Transportation (Non-Medical): No  Physical Activity: Inactive (08/12/2021)   Exercise Vital Sign    Days of Exercise per Week: 0 days    Minutes of Exercise per Session: 0 min  Stress: No Stress Concern Present (08/12/2021)   [place]    Feeling of Stress : Only a little  Recent Concern: Stress - Stress Concern Present (06/25/2021)   [place]    Feeling of Stress : To some extent  Social Connections: Socially Integrated (08/12/2021)   Social Connection and Isolation Panel [NHANES]    Frequency of Communication with Friends and Family: More than three times a week    Frequency of Social Gatherings with Friends and Family: More than three times a week    Attends Religious Services: More than 4 times per year    Active Member of [employer] or Organizations: Yes    Attends Music therapist: More than 4 times per year    Marital Status: Married    His Allergies Are:  Allergies  Allergen Reactions   Amlodipine Besy-Benazepril Hcl Swelling and Other (See Comments)    Makes tongue swell (lotrel)   Hydrocodone Itching    Can tolerate in low doses   Oxycodone Itching    Phenergan [Promethazine Hcl] Other (See Comments)    "I can't remember."  :   His Current Medications Are:  Outpatient Encounter Medications as of 05/12/2022  Medication Sig   albuterol (PROVENTIL) (2.5 MG/3ML) 0.083% nebulizer solution USE 1 VIAL IN NEBULIZER EVERY 6 HOURS AS NEEDED FOR WHEEZING AND FOR SHORTNESS OF BREATH   albuterol (VENTOLIN HFA) 108 (90 Base) MCG/ACT inhaler Inhale 2 puffs into the lungs every 6 (six) hours as needed for wheezing or shortness of breath.   benzonatate (TESSALON) 200 MG capsule Take 1 capsule (200 mg total) by mouth 3 (three) times daily as needed for cough.   bisoprolol (ZEBETA)  5 MG tablet Take 1/2 (one-half) tablet by mouth once daily   Blood Glucose Monitoring Suppl ([place]) w/Device KIT Use to test blood sugar daily as directed. DX: E11.65   cetirizine (ZYRTEC) 10 MG tablet Take 1 tablet by mouth once daily   Cholecalciferol (VITAMIN D3) 125 MCG (5000 UT) TABS Take 5,000 Units by mouth daily.   cycloSPORINE (RESTASIS) 0.05 % ophthalmic emulsion Apply to eye.   dapagliflozin propanediol (FARXIGA) 10 MG TABS tablet Take 1 tablet (10 mg total) by mouth daily.   fluticasone (FLONASE) 50 MCG/ACT nasal spray Place 2 sprays into both nostrils daily.   furosemide (LASIX) 40 MG tablet Take 40 mg by mouth daily as needed (fluid retention.).   GEMTESA 75 MG TABS Take 1 tablet by mouth daily.   glucose blood (ONETOUCH VERIO) test strip Use to test blood sugar daily as directed. DX: E11.65   hydrALAZINE (APRESOLINE) 100 MG tablet Take 100 mg by mouth 3 (three) times daily.   HYDROcodone-acetaminophen (NORCO/VICODIN) 5-325 MG tablet Take 1-2 tablets by mouth every 4 (four) hours as needed.   isosorbide dinitrate (ISORDIL) 20 MG tablet Take 1 tablet (20 mg total) by mouth 3 (three) times daily.   Lancets (ONETOUCH DELICA PLUS 123XX123) MISC Use to test blood sugar daily as directed. DX: E11.65   omeprazole (PRILOSEC) 40 MG capsule TAKE 1 CAPSULE  BY MOUTH ONCE DAILY 30-60  MINUTES  PRIOR  TO  BREAKFAST   Semaglutide,0.25 or 0.5MG /DOS, (OZEMPIC, 0.25 OR 0.5 MG/DOSE,) 2 MG/3ML SOPN Inject 0.5 mg into the skin once a week.   sertraline (ZOLOFT) 100 MG tablet TAKE 1 TABLET BY MOUTH ONCE DAILY . APPOINTMENT REQUIRED FOR FUTURE REFILLS   tamsulosin (FLOMAX) 0.4 MG CAPS capsule TAKE 1 CAPSULE BY MOUTH ONCE DAILY AFTER SUPPER   mirabegron ER (MYRBETRIQ) 25 MG TB24 tablet Take 1 tablet (25 mg total) by mouth daily. (Patient not taking: Reported on 05/12/2022)   nitroGLYCERIN (NITROSTAT) 0.4 MG SL tablet Place 1 tablet (0.4 mg total) under the tongue every 5 (five) minutes x 3 doses as needed for chest pain. (Patient not taking: Reported on 04/17/2021)   olmesartan (BENICAR) 20 MG tablet Take 1 tablet (20 mg total) by mouth daily. (Patient not taking: Reported on 05/12/2022)   roflumilast (DALIRESP) 500 MCG TABS tablet Take 1 tablet (500 mcg total) by mouth daily. (Patient not taking: Reported on 05/12/2022)   spironolactone (ALDACTONE) 25 MG tablet Take 25 mg by mouth daily. (Patient not taking: Reported on 05/12/2022)   [DISCONTINUED] glimepiride (AMARYL) 4 MG tablet Take by mouth. (Patient not taking: Reported on 01/07/2022)   [DISCONTINUED] hydrALAZINE (APRESOLINE) 25 MG tablet Take 3 tablets (75 mg total) by mouth 3 (three) times daily.   [DISCONTINUED] hydrALAZINE (APRESOLINE) 50 MG tablet Take 1 tablet (50 mg total) by mouth 3 (three) times daily.   [DISCONTINUED] meclizine (ANTIVERT) 25 MG tablet Take 1 tablet (25 mg total) by mouth 3 (three) times daily as needed for dizziness. (Patient not taking: Reported on 05/12/2022)   No facility-administered encounter medications on file as of 05/12/2022.  :   Review of Systems:  Out of a complete 14 point review of systems, all are reviewed and negative with the exception of these symptoms as listed below:  Review of Systems  Neurological:        Balance issues,  Dr. Rolena Infante stating needs to see [place] about  staggering walk,  using cane.  Has had falls.  Objective:  Neurological Exam  Physical Exam Physical Examination:   Vitals:   05/12/22 0752  BP: 131/72  Pulse: 72    General Examination: The patient is a very pleasant 77 y.o. male in no acute distress. He appears well-developed and well-nourished and well groomed.   HEENT: Normocephalic, atraumatic, pupils are slightly unequal, with left pupil slightly smaller than right, both reactive to light.  Corrective eyeglasses in place. Face is symmetric with normal facial animation and normal hearing noted.  No carotid bruits, neck mobility is limited. Oropharynx exam reveals: mild to moderate mouth dryness, adequate dental hygiene. Tongue protrudes centrally and palate elevates symmetrically.     Chest: Clear to auscultation without wheezing, rhonchi or crackles noted.   Heart: S1+S2+0, regular and normal without murmurs, rubs or gallops noted.    Abdomen: Soft, non-tender and non-distended.   Extremities: There is no pitting edema in the distal lower extremities bilaterally.    Skin: Warm and dry without trophic changes noted.   Musculoskeletal: exam reveals left knee pain with limp on the left.  Patient reports low back pain.      Neurologically:  Mental status: The patient is awake, alert and oriented in all 4 spheres. His immediate and remote memory, attention, language skills and fund of knowledge are appropriate. There is no evidence of aphasia, agnosia, apraxia or anomia. Speech is clear with normal prosody and enunciation. Thought process is linear. Mood is normal and affect is normal.  Cranial nerves II - XII are as described above under HEENT exam.  Motor exam: Normal bulk, strength and tone is noted. There is no drift, or tremor or rebound. Reflexes are 1+ in the upper extremities and trace in the lower extremities.  Fine motor skills are intact in the upper extremities with finger taps, hand movements and rapid alternating  patting, normal foot taps bilaterally.  Cerebellar testing: No dysmetria or intention tremor. There is no truncal or gait ataxia.  Normal finger-to-nose, has mild difficulty with the right leg when doing heel-to-shin but no dysmetria.  Normal heel-to-shin on the left. Sensory exam: intact to light touch in the upper and lower extremities.  Gait, station and balance: He stands easily. No veering to one side is noted. No leaning to one side is noted. Posture is age-appropriate and stance is mildly wider base.  He walks slightly insecurely and slightly wider base, no shuffling, preserved arm swing.  He can walk without his cane but brought a single-point cane as well.   Assessment & Plan:  In summary, Kenneth Fuller is a 77 year old male with an underlying complex medical history of asthma, allergies, coronary artery disease, congestive heart failure, COPD, fibromyalgia, diabetes, reflux disease, gout, hypertension, hyperlipidemia, hypogonadism, degenerative neck disease, status post neck surgery, arthritis of the left knee, lumbar radiculopathy, status post back surgery, history of sleep apnea (currently not on PAP therapy), history of neuropathy, and obesity, who presents for evaluation of his gait and balance disorder of several months duration.  Examination is not telltale for any specific underlying neurological condition, in particular, no cerebellar ataxia noted, no steppage gait, no shuffling, no evidence of parkinsonism.  I believe his balance problem and gait disorder are likely multifactorial secondary to degenerative lumbar disc disease, degenerative arthritis of the knee, untreated obstructive sleep apnea, high risk medication use and polypharmacy with several medications that could affect his balance including clonazepam, hydrocodone, Myrbetriq, sertraline, meclizine.  His medication list still has gabapentin and tramadol listed but he indicates that  he no longer takes these medicines.  Having  fallen is also an independent risk factor for falls.  He had a recent brain MRI.  His history is not suggestive of a stroke or TIA.  At this juncture, he is advised to follow-up with his providers and also have his PCP and pain management doctor reviewed his medication list.  He is advised to continue to stay well-hydrated, avoid caffeine, avoid alcohol, and start using a walker for gait safety.  He is advised to follow-up with you as scheduled as well.  He is advised to follow-up with his pulmonologist regarding his untreated sleep apnea.  I did not suggest any new medications or test for my end of things at this time.  I answered all his questions today and he was in agreement.   This was an extended visit of over 1 hour with extended chart review involved as well as prolonged counseling. Thank you very much for allowing me to weigh in. If I can be of any further assistance to you please do not hesitate to call me at [phone].  Sincerely,   Star Age, MD, PhD

## 2022-05-15 DIAGNOSIS — J449 Chronic obstructive pulmonary disease, unspecified: Secondary | ICD-10-CM | POA: Diagnosis not present

## 2022-05-15 DIAGNOSIS — I5032 Chronic diastolic (congestive) heart failure: Secondary | ICD-10-CM | POA: Diagnosis not present

## 2022-05-15 DIAGNOSIS — R0902 Hypoxemia: Secondary | ICD-10-CM | POA: Diagnosis not present

## 2022-05-15 DIAGNOSIS — G4733 Obstructive sleep apnea (adult) (pediatric): Secondary | ICD-10-CM | POA: Diagnosis not present

## 2022-05-25 ENCOUNTER — Ambulatory Visit: Payer: PPO | Admitting: Pulmonary Disease

## 2022-05-26 DIAGNOSIS — R350 Frequency of micturition: Secondary | ICD-10-CM | POA: Diagnosis not present

## 2022-05-26 DIAGNOSIS — N3942 Incontinence without sensory awareness: Secondary | ICD-10-CM | POA: Diagnosis not present

## 2022-05-31 ENCOUNTER — Encounter: Payer: Self-pay | Admitting: Family Medicine

## 2022-05-31 ENCOUNTER — Ambulatory Visit (INDEPENDENT_AMBULATORY_CARE_PROVIDER_SITE_OTHER): Payer: PPO | Admitting: Family Medicine

## 2022-05-31 VITALS — BP 169/78 | HR 74 | Ht 67.0 in | Wt 223.0 lb

## 2022-05-31 DIAGNOSIS — E1142 Type 2 diabetes mellitus with diabetic polyneuropathy: Secondary | ICD-10-CM

## 2022-05-31 DIAGNOSIS — J9611 Chronic respiratory failure with hypoxia: Secondary | ICD-10-CM | POA: Diagnosis not present

## 2022-05-31 DIAGNOSIS — N1832 Chronic kidney disease, stage 3b: Secondary | ICD-10-CM | POA: Diagnosis not present

## 2022-05-31 DIAGNOSIS — E119 Type 2 diabetes mellitus without complications: Secondary | ICD-10-CM | POA: Diagnosis not present

## 2022-05-31 DIAGNOSIS — J45991 Cough variant asthma: Secondary | ICD-10-CM

## 2022-05-31 DIAGNOSIS — I5032 Chronic diastolic (congestive) heart failure: Secondary | ICD-10-CM

## 2022-05-31 DIAGNOSIS — I2510 Atherosclerotic heart disease of native coronary artery without angina pectoris: Secondary | ICD-10-CM | POA: Diagnosis not present

## 2022-05-31 DIAGNOSIS — F339 Major depressive disorder, recurrent, unspecified: Secondary | ICD-10-CM | POA: Diagnosis not present

## 2022-05-31 DIAGNOSIS — E785 Hyperlipidemia, unspecified: Secondary | ICD-10-CM | POA: Diagnosis not present

## 2022-05-31 DIAGNOSIS — E1169 Type 2 diabetes mellitus with other specified complication: Secondary | ICD-10-CM | POA: Diagnosis not present

## 2022-05-31 DIAGNOSIS — I10 Essential (primary) hypertension: Secondary | ICD-10-CM

## 2022-05-31 DIAGNOSIS — Z7985 Long-term (current) use of injectable non-insulin antidiabetic drugs: Secondary | ICD-10-CM | POA: Diagnosis not present

## 2022-05-31 LAB — BAYER DCA HB A1C WAIVED: HB A1C (BAYER DCA - WAIVED): 5.2 % (ref 4.8–5.6)

## 2022-05-31 MED ORDER — OLMESARTAN MEDOXOMIL 20 MG PO TABS: 20.0000 mg | ORAL_TABLET | Freq: Every day | ORAL | 3 refills | Status: DC

## 2022-05-31 MED ORDER — ROFLUMILAST 500 MCG PO TABS: 500.0000 ug | ORAL_TABLET | Freq: Every day | ORAL | 3 refills | Status: DC

## 2022-05-31 MED ORDER — SERTRALINE HCL 100 MG PO TABS: 100.0000 mg | ORAL_TABLET | Freq: Every day | ORAL | 3 refills | Status: DC

## 2022-05-31 NOTE — Progress Notes (Signed)
BP (!) 169/78   Pulse 74   Ht 5\' 7"  (1.702 m)   Wt 223 lb (101.2 kg)   SpO2 95%   BMI 34.93 kg/m    Subjective:   Patient ID: Kenneth Fuller, male    DOB: 01-Dec-1945, 77 y.o.   MRN: [id number]  HPI: KEY CEN is a 77 y.o. male presenting on 05/31/2022 for Medical Management of Chronic Issues, Hypertension, and Chronic Kidney Disease   HPI Type 2 diabetes mellitus Patient comes in today for recheck of his diabetes. Patient has been currently taking Ozempic and [place]. Patient is currently on an ACE inhibitor/ARB. Patient has not seen an ophthalmologist this year. Patient denies any new issues with their feet. The symptom started onset as an adult hypertension and CHF and CAD and hyperlipidemia ARE RELATED TO DM   Hypertension and CHF Patient is currently on bisoprolol and hydralazine and isosorbide and olmesartan and spironolactone, and their blood pressure today is 172/88 and 185/89. Patient denies any lightheadedness or dizziness. Patient denies headaches, blurred vision, chest pains, shortness of breath, or weakness. Denies any side effects from medication and is content with current medication.   Hyperlipidemia and CAD Patient is coming in for recheck of his hyperlipidemia. The patient is currently taking no statin currently, has been intolerant. They deny any issues with myalgias or history of liver damage from it. They deny any focal numbness or weakness or chest pain.   Depression recheck Patient is coming in today for depression recheck.  He is currently taking sertraline.  Feels like he is doing well, denies any major issues    05/31/2022   11:32 AM 05/31/2022   11:31 AM 12/03/2021    9:22 AM 11/19/2021    9:49 AM 09/14/2021    3:44 PM  Depression screen PHQ 2/9  Decreased Interest  0 0 0 0  Down, Depressed, Hopeless  0 0 0 0  PHQ - 2 Score  0 0 0 0  Altered sleeping 2  0 0 0  Tired, decreased energy 2  0 0 1  Change in appetite 0  0 0 1  Feeling bad or  failure about yourself  0  0 0 0  Trouble concentrating 1  0 0 1  Moving slowly or fidgety/restless 0  0 0 0  Suicidal thoughts 0  0 0 0  PHQ-9 Score   0 0 3  Difficult doing work/chores Somewhat difficult  Not difficult at all Not difficult at all Not difficult at all     Relevant past medical, surgical, family and social history reviewed and updated as indicated. Interim medical history since our last visit reviewed. Allergies and medications reviewed and updated.  Review of Systems  Constitutional:  Negative for chills and fever.  Eyes:  Negative for visual disturbance.  Respiratory:  Negative for shortness of breath and wheezing.   Cardiovascular:  Negative for chest pain and leg swelling.  Musculoskeletal:  Negative for back pain and gait problem.  Skin:  Negative for rash.  Psychiatric/Behavioral:  Positive for sleep disturbance (Sometimes feels like he is getting sweats at night). Negative for decreased concentration and dysphoric mood.   All other systems reviewed and are negative.   Per HPI unless specifically indicated above   Allergies as of 05/31/2022       Reactions   Amlodipine Besy-benazepril Hcl Swelling, Other (See Comments)   Makes tongue swell (lotrel)   Hydrocodone Itching   Can tolerate in low doses  Oxycodone Itching   Phenergan [promethazine Hcl] Other (See Comments)   "I can't remember."        Medication List        Accurate as of May 31, 2022 12:02 PM. If you have any questions, ask your nurse or doctor.          albuterol 108 (90 Base) MCG/ACT inhaler Commonly known as: VENTOLIN HFA Inhale 2 puffs into the lungs every 6 (six) hours as needed for wheezing or shortness of breath.   albuterol (2.5 MG/3ML) 0.083% nebulizer solution Commonly known as: PROVENTIL USE 1 VIAL IN NEBULIZER EVERY 6 HOURS AS NEEDED FOR WHEEZING AND FOR SHORTNESS OF BREATH   benzonatate 200 MG capsule Commonly known as: TESSALON Take 1 capsule (200 mg total)  by mouth 3 (three) times daily as needed for cough.   bisoprolol 5 MG tablet Commonly known as: ZEBETA Take 1/2 (one-half) tablet by mouth once daily   cetirizine 10 MG tablet Commonly known as: ZYRTEC Take 1 tablet by mouth once daily   cycloSPORINE 0.05 % ophthalmic emulsion Commonly known as: RESTASIS Apply to eye.   dapagliflozin propanediol 10 MG Tabs tablet Commonly known as: Farxiga Take 1 tablet (10 mg total) by mouth daily.   fluticasone 50 MCG/ACT nasal spray Commonly known as: FLONASE Place 2 sprays into both nostrils daily.   furosemide 40 MG tablet Commonly known as: LASIX Take 40 mg by mouth daily as needed (fluid retention.).   Gemtesa 75 MG Tabs Generic drug: Vibegron Take 1 tablet by mouth daily.   hydrALAZINE 100 MG tablet Commonly known as: APRESOLINE Take 100 mg by mouth 3 (three) times daily.   HYDROcodone-acetaminophen 5-325 MG tablet Commonly known as: NORCO/VICODIN Take 1-2 tablets by mouth every 4 (four) hours as needed.   isosorbide dinitrate 20 MG tablet Commonly known as: ISORDIL Take 1 tablet (20 mg total) by mouth 3 (three) times daily.   mirabegron ER 25 MG Tb24 tablet Commonly known as: Myrbetriq Take 1 tablet (25 mg total) by mouth daily.   nitroGLYCERIN 0.4 MG SL tablet Commonly known as: NITROSTAT Place 1 tablet (0.4 mg total) under the tongue every 5 (five) minutes x 3 doses as needed for chest pain.   olmesartan 20 MG tablet Commonly known as: Benicar Take 1 tablet (20 mg total) by mouth daily.   omeprazole 40 MG capsule Commonly known as: PRILOSEC TAKE 1 CAPSULE BY MOUTH ONCE DAILY 30-60  MINUTES  PRIOR  TO  BREAKFAST   OneTouch Delica Plus Lancet33G Misc Use to test blood sugar daily as directed. DX: E11.65   OneTouch Verio Flex System w/Device Kit Use to test blood sugar daily as directed. DX: E11.65   OneTouch Verio test strip Generic drug: glucose blood Use to test blood sugar daily as directed. DX: E11.65    Ozempic (0.25 or 0.5 MG/DOSE) 2 MG/3ML Sopn Generic drug: Semaglutide(0.25 or 0.5MG /DOS) Inject 0.5 mg into the skin once a week.   roflumilast 500 MCG Tabs tablet Commonly known as: DALIRESP Take 1 tablet (500 mcg total) by mouth daily.   sertraline 100 MG tablet Commonly known as: ZOLOFT Take 1 tablet (100 mg total) by mouth daily. What changed: See the new instructions. Changed by: Elige Radon Emanuelle Bastos, MD   spironolactone 25 MG tablet Commonly known as: ALDACTONE Take 25 mg by mouth daily.   tamsulosin 0.4 MG Caps capsule Commonly known as: FLOMAX TAKE 1 CAPSULE BY MOUTH ONCE DAILY AFTER SUPPER   Vitamin D3 125 MCG (  5000 UT) Tabs Take 5,000 Units by mouth daily.         Objective:   BP (!) 169/78   Pulse 74   Ht  (1.702 m)   Wt 223 lb (101.2 kg)   SpO2 95%   BMI 34.93 kg/m   Wt Readings from Last 3 Encounters:  05/31/22 223 lb (101.2 kg)  05/12/22 224 lb (101.6 kg)  01/07/22 231 lb (104.8 kg)    Physical Exam Vitals and nursing note reviewed.  Constitutional:      General: He is not in acute distress.    Appearance: He is well-developed. He is not diaphoretic.  Eyes:     General: No scleral icterus.    Conjunctiva/sclera: Conjunctivae normal.  Neck:     Thyroid: No thyromegaly.  Cardiovascular:     Rate and Rhythm: Normal rate and regular rhythm.     Heart sounds: Normal heart sounds. No murmur heard. Pulmonary:     Effort: Pulmonary effort is normal. No respiratory distress.     Breath sounds: Normal breath sounds. No wheezing.  Musculoskeletal:        General: No swelling. Normal range of motion.     Cervical back: Neck supple.  Lymphadenopathy:     Cervical: No cervical adenopathy.  Skin:    General: Skin is warm and dry.     Findings: No rash.  Neurological:     Mental Status: He is alert and oriented to person, place, and time.     Coordination: Coordination normal.  Psychiatric:        Behavior: Behavior normal.        Assessment & Plan:   Problem List Items Addressed This Visit       Cardiovascular and Mediastinum   Essential hypertension   Relevant Medications   olmesartan (BENICAR) 20 MG tablet   CHF (congestive heart failure)   Relevant Medications   olmesartan (BENICAR) 20 MG tablet   Coronary artery disease involving native coronary artery of native heart without angina pectoris   Relevant Medications   olmesartan (BENICAR) 20 MG tablet     Respiratory   Chronic respiratory failure with hypoxia   Relevant Medications   roflumilast (DALIRESP) 500 MCG TABS tablet   Cough variant asthma   Relevant Medications   olmesartan (BENICAR) 20 MG tablet   roflumilast (DALIRESP) 500 MCG TABS tablet     Endocrine   Diabetes mellitus type II, non insulin dependent - Primary   Relevant Medications   olmesartan (BENICAR) 20 MG tablet   Other Relevant Orders   Microalbumin / creatinine urine ratio   Hyperlipidemia associated with type 2 diabetes mellitus   Relevant Medications   olmesartan (BENICAR) 20 MG tablet     Genitourinary   CKD (chronic kidney disease), stage III   Relevant Orders   CBC with Differential/Platelet   CMP14+EGFR   Lipid panel   Bayer DCA Hb A1c Waived   Microalbumin / creatinine urine ratio     Other   Depression, recurrent   Relevant Medications   sertraline (ZOLOFT) 100 MG tablet   Other Visit Diagnoses     Type 2 diabetes mellitus with diabetic polyneuropathy, without long-term current use of insulin       Relevant Medications   olmesartan (BENICAR) 20 MG tablet   sertraline (ZOLOFT) 100 MG tablet   Other Relevant Orders   CBC with Differential/Platelet   CMP14+EGFR   Lipid panel   Bayer DCA Hb A1c Waived  BP elevated, staying elevated, recheck 169/78.  A1c looks good at 5.2.  He states his only real big complaint is sometimes he gets a little night sweats at night but he denies hypoglycemia.  Recommended monitoring his blood pressure  closely at home every day over the next few weeks and let us know if it is running elevated Follow up plan: Return in about 3 months (around 08/30/2022), or if symptoms worsen or fail to improve, for Diabetes recheck, also wants an appointment for skin procedure removal.  Counseling provided for all of the vaccine components Orders Placed This Encounter  Procedures   CBC with Differential/Platelet   CMP14+EGFR   Lipid panel   Bayer DCA Hb A1c Waived   Microalbumin / creatinine urine ratio    Arville Care, MD [facility] 05/31/2022, 12:02 PM

## 2022-06-01 ENCOUNTER — Telehealth: Payer: Self-pay

## 2022-06-01 LAB — CMP14+EGFR
ALT: 14 IU/L (ref 0–44)
AST: 17 IU/L (ref 0–40)
Albumin/Globulin Ratio: 1.8 (ref 1.2–2.2)
Albumin: 4.2 g/dL (ref 3.8–4.8)
Alkaline Phosphatase: 99 IU/L (ref 44–121)
BUN/Creatinine Ratio: 14 (ref 10–24)
BUN: 16 mg/dL (ref 8–27)
Bilirubin Total: 0.3 mg/dL (ref 0.0–1.2)
CO2: 23 mmol/L (ref 20–29)
Calcium: 9.2 mg/dL (ref 8.6–10.2)
Chloride: 105 mmol/L (ref 96–106)
Creatinine, Ser: 1.18 mg/dL (ref 0.76–1.27)
Globulin, Total: 2.3 g/dL (ref 1.5–4.5)
Glucose: 98 mg/dL (ref 70–99)
Potassium: 4.3 mmol/L (ref 3.5–5.2)
Sodium: 145 mmol/L — ABNORMAL HIGH (ref 134–144)
Total Protein: 6.5 g/dL (ref 6.0–8.5)
eGFR: 64 mL/min/{1.73_m2} (ref >59)

## 2022-06-01 LAB — CBC WITH DIFFERENTIAL/PLATELET
Basophils Absolute: 0.1 10*3/uL (ref 0.0–0.2)
Basos: 1 %
EOS (ABSOLUTE): 0.3 10*3/uL (ref 0.0–0.4)
Eos: 4 %
Hematocrit: 48.8 % (ref 37.5–51.0)
Hemoglobin: 15.7 g/dL (ref 13.0–17.7)
Immature Grans (Abs): 0 10*3/uL (ref 0.0–0.1)
Immature Granulocytes: 0 %
Lymphocytes Absolute: 1.9 10*3/uL (ref 0.7–3.1)
Lymphs: 30 %
MCH: 26.8 pg (ref 26.6–33.0)
MCHC: 32.2 g/dL (ref 31.5–35.7)
MCV: 83 fL (ref 79–97)
Monocytes Absolute: 0.5 10*3/uL (ref 0.1–0.9)
Monocytes: 7 %
Neutrophils Absolute: 3.8 10*3/uL (ref 1.4–7.0)
Neutrophils: 58 %
Platelets: 212 10*3/uL (ref 150–450)
RBC: 5.86 x10E6/uL — ABNORMAL HIGH (ref 4.14–5.80)
RDW: 14.1 % (ref 11.6–15.4)
WBC: 6.5 10*3/uL (ref 3.4–10.8)

## 2022-06-01 LAB — MICROALBUMIN / CREATININE URINE RATIO
Creatinine, Urine: 135 mg/dL
Microalb/Creat Ratio: 51 mg/g creat — ABNORMAL HIGH (ref 0–29)
Microalbumin, Urine: 69 ug/mL

## 2022-06-01 LAB — LIPID PANEL
Chol/HDL Ratio: 3.7 ratio (ref 0.0–5.0)
Cholesterol, Total: 131 mg/dL (ref 100–199)
HDL: 35 mg/dL — ABNORMAL LOW (ref >39)
LDL Chol Calc (NIH): 69 mg/dL (ref 0–99)
Triglycerides: 158 mg/dL — ABNORMAL HIGH (ref 0–149)
VLDL Cholesterol Cal: 27 mg/dL (ref 5–40)

## 2022-06-01 NOTE — Telephone Encounter (Signed)
Kenneth Fuller (Key: [id number]) PA Case ID #: [id number] Rx #: [id number] Need Help? Call us at [phone] Status sent iconSent to Plan today Drug Roflumilast tablets ePA cloud logo Form [facility] [employer] Electronic Prior Authorization Form 2017 NCPDP

## 2022-06-04 ENCOUNTER — Other Ambulatory Visit: Payer: Self-pay | Admitting: Family Medicine

## 2022-06-04 ENCOUNTER — Other Ambulatory Visit: Payer: PPO

## 2022-06-04 DIAGNOSIS — E1129 Type 2 diabetes mellitus with other diabetic kidney complication: Secondary | ICD-10-CM | POA: Diagnosis not present

## 2022-06-04 DIAGNOSIS — N189 Chronic kidney disease, unspecified: Secondary | ICD-10-CM | POA: Diagnosis not present

## 2022-06-04 DIAGNOSIS — E559 Vitamin D deficiency, unspecified: Secondary | ICD-10-CM | POA: Diagnosis not present

## 2022-06-04 DIAGNOSIS — I129 Hypertensive chronic kidney disease with stage 1 through stage 4 chronic kidney disease, or unspecified chronic kidney disease: Secondary | ICD-10-CM | POA: Diagnosis not present

## 2022-06-04 DIAGNOSIS — R809 Proteinuria, unspecified: Secondary | ICD-10-CM | POA: Diagnosis not present

## 2022-06-04 DIAGNOSIS — R3915 Urgency of urination: Secondary | ICD-10-CM

## 2022-06-04 DIAGNOSIS — I5042 Chronic combined systolic (congestive) and diastolic (congestive) heart failure: Secondary | ICD-10-CM | POA: Diagnosis not present

## 2022-06-04 DIAGNOSIS — E1122 Type 2 diabetes mellitus with diabetic chronic kidney disease: Secondary | ICD-10-CM | POA: Diagnosis not present

## 2022-06-04 DIAGNOSIS — R350 Frequency of micturition: Secondary | ICD-10-CM

## 2022-06-10 ENCOUNTER — Ambulatory Visit (INDEPENDENT_AMBULATORY_CARE_PROVIDER_SITE_OTHER): Payer: PPO | Admitting: Pulmonary Disease

## 2022-06-10 ENCOUNTER — Encounter: Payer: Self-pay | Admitting: Pulmonary Disease

## 2022-06-10 VITALS — BP 129/73 | HR 76 | Ht 67.0 in | Wt 222.1 lb

## 2022-06-10 DIAGNOSIS — J4489 Other specified chronic obstructive pulmonary disease: Secondary | ICD-10-CM

## 2022-06-10 DIAGNOSIS — E1122 Type 2 diabetes mellitus with diabetic chronic kidney disease: Secondary | ICD-10-CM | POA: Diagnosis not present

## 2022-06-10 DIAGNOSIS — G4733 Obstructive sleep apnea (adult) (pediatric): Secondary | ICD-10-CM | POA: Diagnosis not present

## 2022-06-10 DIAGNOSIS — I129 Hypertensive chronic kidney disease with stage 1 through stage 4 chronic kidney disease, or unspecified chronic kidney disease: Secondary | ICD-10-CM | POA: Diagnosis not present

## 2022-06-10 DIAGNOSIS — N189 Chronic kidney disease, unspecified: Secondary | ICD-10-CM | POA: Diagnosis not present

## 2022-06-10 NOTE — Assessment & Plan Note (Signed)
He does not have COPD. Treating him as mild persistent asthma but he has reverted to Symbicort as needed because his symptoms are intermittent now. He feels good benefit from [place] and would like to continue

## 2022-06-10 NOTE — Progress Notes (Signed)
   Subjective:    Patient ID: Kenneth Fuller, male    DOB: 09/03/1945, 77 y.o.   MRN: [id number]  HPI  77 yo remote smoker for FU of asthma/ COPD and OSA Initial visit 08/2020, He has previously seen Dr. Marchelle Gearing and other providers at [facility] location -Doubt he has significant COPD.  Variability in lung function seems to suggest asthma. Feels daliresp really helped him subjectively    PMH - chronic diastolic heart failure coronary artery disease diabetes, stage3 CKD -asthma as a child , failed medical exam , recurred 2018          He smoked less than 10 pack years before he quit in 1971.  He reports lifelong history of asthma.  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   Chief Complaint  Patient presents with   Follow-up    Pt f/u states that breathing is fine and he is interested in getting the inspire device for his OSA   60-month follow-up visit uses O2 PRN during the day and at night time.  He reports nasal congestion during pollen season but breathing is okay In fact he is only using Symbicort on a as needed basis about twice a week.  He is using cetirizine and Flonase.  Has not needed albuterol much. He reports excessive night sweats, he is drenched and has to change his clothes.  He is again interested in hypoglossal nerve stimulator implant.  He has been unable to use his CPAP due to intolerance over the past 6 months.  He feels he just simply does not sleep well with PAP.  He has tried different types of mask interfaces  Significant tests/ events reviewed NPSG 06/2020 >> BiPAP 15/11 ,med FF mask 11/2015 NPSG  very severe OSA, AHI >100. Split night study showed optimal control with CPAP 5-20     PFTs 01/2021 no airway obstruction, moderate restriction ratio 74, FEV1 55%, FVC 53%, 12% bronchodilator response, FEV1 improved to 62%, DLCO 81%    12/2016 PFT >> ratio 84, severe restriction with reduced diffusion capacity.  Total  lung capacity 72% FVC, 1.6 L / 40% and DLCO of 19.4/68%.   PFT 1/ 2019 showed FEV1 45%, ratio 68, FVC 48% consistent with moderate to severe airflow obstruction   Feno 01/26/2017 ->  8ppb     High resolution CT chest 01/2017 neg for ILD   CT angiogram chest 05/2020 clear lungs CT sinus March 09, 2017 clear sinuses  Review of Systems neg for any significant sore throat, dysphagia, itching, sneezing, nasal congestion or excess/ purulent secretions, fever, chills, sweats, unintended wt loss, pleuritic or exertional cp, hempoptysis, orthopnea pnd or change in chronic leg swelling. Also denies presyncope, palpitations, heartburn, abdominal pain, nausea, vomiting, diarrhea or change in bowel or urinary habits, dysuria,hematuria, rash, arthralgias, visual complaints, headache, numbness weakness or ataxia.     Objective:   Physical Exam  Gen. Pleasant, obese, in no distress ENT - no lesions, no post nasal drip Neck: No JVD, no thyromegaly, no carotid bruits Lungs: no use of accessory muscles, no dullness to percussion, decreased without rales or rhonchi  Cardiovascular: Rhythm regular, heart sounds  normal, no murmurs or gallops, no peripheral edema Musculoskeletal: No deformities, no cyanosis or clubbing , no tremors       Assessment & Plan:

## 2022-06-10 NOTE — Patient Instructions (Addendum)
X home sleep test  After this, we will schedule endoscopy test & refer to ENT for inspire  X BMI is ok

## 2022-06-10 NOTE — Assessment & Plan Note (Signed)
He has been unable to tolerate CPAP in spite of trying different mask interfaces.  He is agreeable to proceed with hypoglossal nerve stimulator implant.  We discussed risks and benefits. We will repeat home sleep test and see if he qualifies. If so, we will proceed with sleep endoscopy/DISE and ENT referral

## 2022-06-14 DIAGNOSIS — I5032 Chronic diastolic (congestive) heart failure: Secondary | ICD-10-CM | POA: Diagnosis not present

## 2022-06-14 DIAGNOSIS — R0902 Hypoxemia: Secondary | ICD-10-CM | POA: Diagnosis not present

## 2022-06-14 DIAGNOSIS — J449 Chronic obstructive pulmonary disease, unspecified: Secondary | ICD-10-CM | POA: Diagnosis not present

## 2022-06-14 DIAGNOSIS — G4733 Obstructive sleep apnea (adult) (pediatric): Secondary | ICD-10-CM | POA: Diagnosis not present

## 2022-06-15 ENCOUNTER — Telehealth: Payer: Self-pay

## 2022-06-15 NOTE — Telephone Encounter (Signed)
Pharmacy Patient Advocate Encounter  Prior Authorization for Roflumilast has been approved by [employer] (ins).    PA # [id number] Effective dates: 06/01/2022 through 06/01/2023 KEY: [id number]

## 2022-06-15 NOTE — Telephone Encounter (Signed)
PA has been approved, documented in separate encounter.

## 2022-06-16 ENCOUNTER — Encounter (HOSPITAL_BASED_OUTPATIENT_CLINIC_OR_DEPARTMENT_OTHER): Payer: Self-pay | Admitting: Physical Therapy

## 2022-06-16 ENCOUNTER — Ambulatory Visit (HOSPITAL_BASED_OUTPATIENT_CLINIC_OR_DEPARTMENT_OTHER): Payer: PPO | Attending: Orthopedic Surgery | Admitting: Physical Therapy

## 2022-06-16 DIAGNOSIS — M5459 Other low back pain: Secondary | ICD-10-CM | POA: Insufficient documentation

## 2022-06-16 DIAGNOSIS — R2689 Other abnormalities of gait and mobility: Secondary | ICD-10-CM | POA: Insufficient documentation

## 2022-06-16 NOTE — Progress Notes (Addendum)
OUTPATIENT PHYSICAL THERAPY THORACOLUMBAR EVALUATION   Patient Name: Kenneth Fuller MRN: [id number] DOB:12/11/1945, 77 y.o., male Today's Date: 06/16/2022  END OF SESSION:   Past Medical History:  Diagnosis Date   Allergy    Asthma    CAD (coronary artery disease)    CHF (congestive heart failure) (HCC)    COPD (chronic obstructive pulmonary disease) (HCC)    Diabetes mellitus    Fibromyalgia    GERD (gastroesophageal reflux disease)    GI bleeding    Gout    Hyperlipidemia    Hypertension    Hypogonadism male    Insomnia    MRSA cellulitis    Neuropathy    Obesity    Shortness of breath dyspnea    with exertion    Sleep apnea    cpap- 14    Wheezing    no asthma diagnosis   Past Surgical History:  Procedure Laterality Date   BACK SURGERY     BIOPSY  12/20/2019   Procedure: BIOPSY;  Surgeon: Rachael Fee, MD;  Location: [facility];  Service: Endoscopy;;   CARDIAC CATHETERIZATION     COLONOSCOPY     COLONOSCOPY WITH PROPOFOL N/A 12/20/2019   Procedure: COLONOSCOPY WITH PROPOFOL;  Surgeon: Rachael Fee, MD;  Location: [facility];  Service: Endoscopy;  Laterality: N/A;   ESOPHAGOGASTRODUODENOSCOPY (EGD) WITH PROPOFOL N/A 12/20/2019   Procedure: ESOPHAGOGASTRODUODENOSCOPY (EGD) WITH PROPOFOL;  Surgeon: Rachael Fee, MD;  Location: [facility];  Service: Endoscopy;  Laterality: N/A;   LEFT HEART CATH AND CORONARY ANGIOGRAPHY N/A 12/02/2016   Procedure: LEFT HEART CATH AND CORONARY ANGIOGRAPHY;  Surgeon: Corky Crafts, MD;  Location: [facility] INVASIVE CV LAB;  Service: Cardiovascular;  Laterality: N/A;   LUMBAR LAMINECTOMY/DECOMPRESSION MICRODISCECTOMY N/A 01/02/2014   Procedure: CENTRAL DECOMPRESSION LUMBAR LAMINECTOMY L3-L4, L4-L5;  Surgeon: Jacki Cones, MD;  Location: [facility];  Service: Orthopedics;  Laterality: N/A;   neck fusion     POLYPECTOMY  12/20/2019   Procedure: POLYPECTOMY;  Surgeon: Rachael Fee, MD;  Location: [facility];   Service: Endoscopy;;   Patient Active Problem List   Diagnosis Date Noted   Chronic respiratory failure with hypoxia (HCC) 10/20/2020   Insomnia    CHF (congestive heart failure) (HCC) 01/06/2018   Depression with anxiety 01/04/2018   CKD (chronic kidney disease), stage III (HCC) 01/04/2018   Asthma-COPD overlap syndrome 01/04/2018   Depression, recurrent (HCC) 11/11/2017   Swelling of lower extremity 11/30/2016   Morbid obesity (HCC) 08/21/2015   Coronary artery disease involving native coronary artery of native heart without angina pectoris 12/05/2014   Urinary urgency 10/15/2014   Spinal stenosis, lumbar region, with neurogenic claudication 01/02/2014   Hyperlipidemia associated with type 2 diabetes mellitus (HCC) 10/25/2013   COLONIC POLYPS, ADENOMATOUS 02/25/2007   Diabetes mellitus type II, non insulin dependent (HCC) 02/25/2007   Gout 02/25/2007   Essential hypertension 02/25/2007   Cough variant asthma 02/25/2007   OSA on CPAP 02/25/2007   FATIGUE, CHRONIC 02/25/2007   HEADACHE, CHRONIC 02/25/2007   GERD (gastroesophageal reflux disease) 02/25/2007    PCP: Dr Ivin Booty [name] MD   REFERRING PROVIDER: Dr Venita Lick   REFERRING DIAG:  Diagnosis  M54.51 (ICD-10-CM) - Vertebrogenic low back pain    Rationale for Evaluation and Treatment: Rehabilitation  THERAPY DIAG:  No diagnosis found.  ONSET DATE: Several years   SUBJECTIVE:  SUBJECTIVE STATEMENT: The patient report a long history of low back pain. He had low back surgery in 2017. He had a lower lumbar fusuion. It helped for some time but now he is back to having significant pain in his back. The pain is located mostly in his right and radiates into his hip. Any prolonged position causes the pain. He has not had any physical  therapy on his back.  He has a particular increase in pain when he lies down at night sleep.  PERTINENT HISTORY:  Cervical fusion (patient can not recall when; lumbar microdiskectomy 2015;   PAIN:  Are you having pain? Yes: NPRS scale: 4-5/10 Pain location: Right side lumbar spine Pain description: Aching Aggravating factors: Any, prolonged positioning Relieving factors: Rest  PRECAUTIONS: has had microdiskectomy/ decompression in the lower lumbar spine    WEIGHT BEARING RESTRICTIONS: No  FALLS:  Has patient fallen in last 6 months? No  LIVING ENVIRONMENT: OCCUPATION:  Retired  PLOF: Independent  PATIENT GOALS: To develop program to have less pain  NEXT MD VISIT: Nothing scheduled at this time  OBJECTIVE:   DIAGNOSTIC FINDINGS:  Nothing in the chart   PATIENT SURVEYS:  FOTO    SCREENING FOR RED FLAGS: Bowel or bladder incontinence: No Spinal tumors: No Cauda equina syndrome: No Compression fracture: No Abdominal aneurysm: No  COGNITION: Overall cognitive status: Within functional limits for tasks assessed     SENSATION: Bilateral peripheral neuropathy  MUSCLE LENGTH: Hamstrings: Right *** deg; Left *** deg Thomas test: Right *** deg; Left *** deg  POSTURE: rounded shoulders and forward head  PALPATION: Tenderness to palpation   LUMBAR ROM:   AROM eval  Flexion Limited 50% catching pain coming up from flexion  Extension Limited 50%  Right lateral flexion Limited 50% with pain on the right  Left lateral flexion Limited 50% with pain on the left  Right rotation Limited 50% with pain on the right  Left rotation Limited 50% with pain on the left   (Blank rows = not tested)  LOWER EXTREMITY ROM:     Passive  Right eval Left eval  Hip flexion    Hip extension    Hip abduction    Hip adduction    Hip internal rotation    Hip external rotation    Knee flexion    Knee extension    Ankle dorsiflexion    Ankle plantarflexion    Ankle inversion     Ankle eversion     (Blank rows = not tested)  LOWER EXTREMITY MMT:    MMT Right eval Left eval  Hip flexion 12 17.8  Hip extension    Hip abduction 12.5 18.1  Hip adduction    Hip internal rotation    Hip external rotation    Knee flexion    Knee extension 20 27.8  Ankle dorsiflexion    Ankle plantarflexion    Ankle inversion    Ankle eversion     (Blank rows = not tested) not tested secondary to time  LUMBAR SPECIAL TESTS:  {lumbar special test:25242}  FUNCTIONAL TESTS:  {Functional tests:24029}  GAIT: Flexed trunk; decreased bilateral hip flexion  TODAY'S TREATMENT:  DATE:     PATIENT EDUCATION:  Education details: HEP, symptom management; benefit of aquatics   Person educated: Patient Education method: Explanation, Demonstration, Tactile cues, Verbal cues, and Handouts Education comprehension: verbalized understanding  HOME EXERCISE PROGRAM:   ASSESSMENT:  CLINICAL IMPRESSION: Patient is a 77 year old male presents with a longstanding history of lower back pain on the right side.  Pain can radiate into his right hip.  He has increased pain with any prolonged positioning with or be sitting or lying down, standing, walking.  He has limited lumbar flexion and bilateral rotation.  He has around a 30% strength deficit on the right compared to left.  He would benefit from skilled therapy to develop aquatic program to increase bilateral lower extremity strength and improve general functional mobility. OBJECTIVE IMPAIRMENTS: {opptimpairments:25111}.   ACTIVITY LIMITATIONS: {activitylimitations:27494}  PARTICIPATION LIMITATIONS: {participationrestrictions:25113}  PERSONAL FACTORS: {Personal factors:25162} are also affecting patient's functional outcome.   REHAB POTENTIAL: {rehabpotential:25112}  CLINICAL DECISION MAKING: {clinical decision  making:25114}  EVALUATION COMPLEXITY: {Evaluation complexity:25115}   GOALS: Goals reviewed with patient? {yes/no:20286}  SHORT TERM GOALS: Target date: ***  *** Baseline: Goal status: {GOALSTATUS:25110}  2.  *** Baseline:  Goal status: {GOALSTATUS:25110}  3.  *** Baseline:  Goal status: {GOALSTATUS:25110}  4.  *** Baseline:  Goal status: {GOALSTATUS:25110}  5.  *** Baseline:  Goal status: {GOALSTATUS:25110}  6.  *** Baseline:  Goal status: {GOALSTATUS:25110}  LONG TERM GOALS: Target date: ***  *** Baseline:  Goal status: {GOALSTATUS:25110}  2.  *** Baseline:  Goal status: {GOALSTATUS:25110}  3.  *** Baseline:  Goal status: {GOALSTATUS:25110}  4.  *** Baseline:  Goal status: {GOALSTATUS:25110}  5.  *** Baseline:  Goal status: {GOALSTATUS:25110}  6.  *** Baseline:  Goal status: {GOALSTATUS:25110}  PLAN:  PT FREQUENCY: {rehab frequency:25116}  PT DURATION: {rehab duration:25117}  PLANNED INTERVENTIONS: {rehab planned interventions:25118::"Therapeutic exercises","Therapeutic activity","Neuromuscular re-education","Balance training","Gait training","Patient/Family education","Self Care","Joint mobilization"}.  PLAN FOR NEXT SESSION: ***   Dessie Coma, PT 06/16/2022, 1:46 PM

## 2022-06-17 ENCOUNTER — Encounter (HOSPITAL_BASED_OUTPATIENT_CLINIC_OR_DEPARTMENT_OTHER): Payer: Self-pay | Admitting: Physical Therapy

## 2022-06-23 ENCOUNTER — Ambulatory Visit: Payer: PPO

## 2022-06-23 DIAGNOSIS — M5459 Other low back pain: Secondary | ICD-10-CM | POA: Diagnosis not present

## 2022-06-23 DIAGNOSIS — G4733 Obstructive sleep apnea (adult) (pediatric): Secondary | ICD-10-CM | POA: Diagnosis not present

## 2022-06-23 DIAGNOSIS — M5451 Vertebrogenic low back pain: Secondary | ICD-10-CM | POA: Diagnosis not present

## 2022-06-24 ENCOUNTER — Encounter (HOSPITAL_BASED_OUTPATIENT_CLINIC_OR_DEPARTMENT_OTHER): Payer: Self-pay | Admitting: Physical Therapy

## 2022-06-24 ENCOUNTER — Ambulatory Visit (HOSPITAL_BASED_OUTPATIENT_CLINIC_OR_DEPARTMENT_OTHER): Payer: PPO | Admitting: Physical Therapy

## 2022-06-24 DIAGNOSIS — R2689 Other abnormalities of gait and mobility: Secondary | ICD-10-CM

## 2022-06-24 DIAGNOSIS — Z6835 Body mass index (BMI) 35.0-35.9, adult: Secondary | ICD-10-CM | POA: Diagnosis not present

## 2022-06-24 DIAGNOSIS — G4733 Obstructive sleep apnea (adult) (pediatric): Secondary | ICD-10-CM | POA: Diagnosis not present

## 2022-06-24 DIAGNOSIS — M5459 Other low back pain: Secondary | ICD-10-CM | POA: Diagnosis not present

## 2022-06-24 NOTE — Therapy (Signed)
OUTPATIENT PHYSICAL THERAPY THORACOLUMBAR EVALUATION     Patient Name: Kenneth Fuller MRN: [id number] DOB:09/11/1945, 77 y.o., male Today's Date: 06/16/2022   END OF SESSION:   PT End of Session -     Visit Number 2   Number of Visits 16   Date for PT Re-Evaluation 08/11/22   Authorization Type    PT Start Time 1532    PT Stop Time 1615   PT Time Calculation (min) 43 min   Activity Tolerance Tolerated well   Behavior During Therapy [facility]           Past Medical History:  Diagnosis Date   Allergy     Asthma     CAD (coronary artery disease)     CHF (congestive heart failure) (HCC)     COPD (chronic obstructive pulmonary disease) (HCC)     Diabetes mellitus     Fibromyalgia     GERD (gastroesophageal reflux disease)     GI bleeding     Gout     Hyperlipidemia     Hypertension     Hypogonadism male     Insomnia     MRSA cellulitis     Neuropathy     Obesity     Shortness of breath dyspnea      with exertion    Sleep apnea      cpap- 14    Wheezing      no asthma diagnosis         Past Surgical History:  Procedure Laterality Date   BACK SURGERY       BIOPSY   12/20/2019    Procedure: BIOPSY;  Surgeon: Rachael Fee, MD;  Location: [facility];  Service: Endoscopy;;   CARDIAC CATHETERIZATION       COLONOSCOPY       COLONOSCOPY WITH PROPOFOL N/A 12/20/2019    Procedure: COLONOSCOPY WITH PROPOFOL;  Surgeon: Rachael Fee, MD;  Location: [facility];  Service: Endoscopy;  Laterality: N/A;   ESOPHAGOGASTRODUODENOSCOPY (EGD) WITH PROPOFOL N/A 12/20/2019    Procedure: ESOPHAGOGASTRODUODENOSCOPY (EGD) WITH PROPOFOL;  Surgeon: Rachael Fee, MD;  Location: [facility];  Service: Endoscopy;  Laterality: N/A;   LEFT HEART CATH AND CORONARY ANGIOGRAPHY N/A 12/02/2016    Procedure: LEFT HEART CATH AND CORONARY ANGIOGRAPHY;  Surgeon: Corky Crafts, MD;  Location: [facility] INVASIVE CV LAB;  Service: Cardiovascular;  Laterality: N/A;   LUMBAR  LAMINECTOMY/DECOMPRESSION MICRODISCECTOMY N/A 01/02/2014    Procedure: CENTRAL DECOMPRESSION LUMBAR LAMINECTOMY L3-L4, L4-L5;  Surgeon: Jacki Cones, MD;  Location: [facility];  Service: Orthopedics;  Laterality: N/A;   neck fusion       POLYPECTOMY   12/20/2019    Procedure: POLYPECTOMY;  Surgeon: Rachael Fee, MD;  Location: [facility];  Service: Endoscopy;;        Patient Active Problem List    Diagnosis Date Noted   Chronic respiratory failure with hypoxia (HCC) 10/20/2020   Insomnia     CHF (congestive heart failure) (HCC) 01/06/2018   Depression with anxiety 01/04/2018   CKD (chronic kidney disease), stage III (HCC) 01/04/2018   Asthma-COPD overlap syndrome 01/04/2018   Depression, recurrent (HCC) 11/11/2017   Swelling of lower extremity 11/30/2016   Morbid obesity (HCC) 08/21/2015   Coronary artery disease involving native coronary artery of native heart without angina pectoris 12/05/2014   Urinary urgency 10/15/2014   Spinal stenosis, lumbar region, with neurogenic claudication 01/02/2014   Hyperlipidemia associated with type 2 diabetes mellitus (HCC) 10/25/2013  COLONIC POLYPS, ADENOMATOUS 02/25/2007   Diabetes mellitus type II, non insulin dependent (HCC) 02/25/2007   Gout 02/25/2007   Essential hypertension 02/25/2007   Cough variant asthma 02/25/2007   OSA on CPAP 02/25/2007   FATIGUE, CHRONIC 02/25/2007   HEADACHE, CHRONIC 02/25/2007   GERD (gastroesophageal reflux disease) 02/25/2007      PCP: Dr Ivin Booty [name] MD    REFERRING PROVIDER: Dr Venita Lick    REFERRING DIAG:  Diagnosis  M54.51 (ICD-10-CM) - Vertebrogenic low back pain      Rationale for Evaluation and Treatment: Rehabilitation   THERAPY DIAG:  No diagnosis found.   ONSET DATE: Several years    SUBJECTIVE:                                                                                                                                                                                             SUBJECTIVE STATEMENT: The patient report a long history of low back pain. He had low back surgery in 2017. He had a lower lumbar fusuion. It helped for some time but now he is back to having significant pain in his back. The pain is located mostly in his right and radiates into his hip. Any prolonged position causes the pain. He has not had any physical therapy on his back.  He has a particular increase in pain when he lies down at night sleep.   PERTINENT HISTORY:  Cervical fusion (patient can not recall when; lumbar microdiskectomy 2015;    PAIN:  Are you having pain? Yes: NPRS scale: 4-5/10 Pain location: Right side lumbar spine Pain description: Aching Aggravating factors: Any, prolonged positioning Relieving factors: Rest   PRECAUTIONS: has had microdiskectomy/ decompression in the lower lumbar spine     WEIGHT BEARING RESTRICTIONS: No   FALLS:  Has patient fallen in last 6 months? No   LIVING ENVIRONMENT: OCCUPATION:  Retired   PLOF: Independent   PATIENT GOALS: To develop program to have less pain   NEXT MD VISIT: Nothing scheduled at this time   OBJECTIVE:    DIAGNOSTIC FINDINGS:  Nothing in the chart    PATIENT SURVEYS:  FOTO     SCREENING FOR RED FLAGS: Bowel or bladder incontinence: No Spinal tumors: No Cauda equina syndrome: No Compression fracture: No Abdominal aneurysm: No   COGNITION: Overall cognitive status: Within functional limits for tasks assessed                          SENSATION: Bilateral peripheral neuropathy   MUSCLE LENGTH:     POSTURE: rounded shoulders and forward head  PALPATION: Tenderness to palpation    LUMBAR ROM:    AROM eval  Flexion Limited 50% catching pain coming up from flexion  Extension Limited 50%  Right lateral flexion Limited 50% with pain on the right  Left lateral flexion Limited 50% with pain on the left  Right rotation Limited 50% with pain on the right  Left rotation Limited 50% with pain  on the left   (Blank rows = not tested)   LOWER EXTREMITY ROM:      Passive  Right eval Left eval  Hip flexion      Hip extension      Hip abduction      Hip adduction      Hip internal rotation      Hip external rotation      Knee flexion      Knee extension      Ankle dorsiflexion      Ankle plantarflexion      Ankle inversion      Ankle eversion       (Blank rows = not tested)   LOWER EXTREMITY MMT:     MMT Right eval Left eval  Hip flexion 12 17.8  Hip extension      Hip abduction 12.5 18.1  Hip adduction      Hip internal rotation      Hip external rotation      Knee flexion      Knee extension 20 27.8  Ankle dorsiflexion      Ankle plantarflexion      Ankle inversion      Ankle eversion       (Blank rows = not tested) not tested secondary to time     GAIT: Flexed trunk; decreased bilateral hip flexion  TODAY'S TREATMENT:                                                                                                                              Pt seen for aquatic therapy today.  Treatment took place in water 3.5-4.75 ft in depth at the [facility] pool. Temp of water was 91.  Pt entered/exited the pool via stair using step through pattern with hand rail.  *intro to setting *walking unsupported 3.6 then 4.8 ft forward back and side stepping *L stretch *lumbar rotation in pain free range *Core engagement: Yellow Hand buoy carry forward and back x 6 widths *Hamstring and gastroc stretch at steps Seated on bench: cycling; LAQ; hip add/abd; flutter/SLR 2 set x10-20 reps - IT band stretch  Pt requires the buoyancy and hydrostatic pressure of water for support, and to offload joints by unweighting joint load by at least 50 % in navel deep water and by at least 75-80% in chest to neck deep water.  Viscosity of the water is needed for resistance of strengthening. Water current perturbations provides challenge to standing balance requiring increased core  activation.       PATIENT  EDUCATION:  Education details: HEP, symptom management; benefit of aquatics   Person educated: Patient Education method: Explanation, Demonstration, Tactile cues, Verbal cues, and Handouts Education comprehension: verbalized understanding   HOME EXERCISE PROGRAM:     ASSESSMENT:   CLINICAL IMPRESSION: Pt demonstrates safety and indep in setting with therapist instructing from deck.  He is directed through general stretching and ROM/strengthening assessing ability and tolerance.  He does wince with discomfort in right lat and medial thigh with add/abd and with all stretching.  Of note he has significant ms tightness throughout LB and LE's.  Initial clinical impression Patient is a 77 year old male presents with a longstanding history of lower back pain on the right side.  Pain can radiate into his right hip.  He has increased pain with any prolonged positioning with or be sitting or lying down, standing, walking.  He has limited lumbar flexion and bilateral rotation.  He has around a 30% strength deficit on the right compared to left.  He would benefit from skilled therapy to develop aquatic program to increase bilateral lower extremity strength and improve general functional mobility. OBJECTIVE IMPAIRMENTS: Abnormal gait, decreased activity tolerance, decreased endurance, decreased knowledge of condition, decreased mobility, difficulty walking, decreased ROM, decreased strength, increased fascial restrictions, postural dysfunction, and pain.    ACTIVITY LIMITATIONS: carrying, lifting, bending, sitting, standing, squatting, sleeping, stairs, and locomotion level   PARTICIPATION LIMITATIONS: meal prep, cleaning, laundry, driving, shopping, and community activity   PERSONAL FACTORS: 3+ comorbidities: COPD, Lumbar fusion, cervical fusion   are also affecting patient's functional outcome.    REHAB POTENTIAL: Good   CLINICAL DECISION MAKING: Evolving/moderate  complexity   EVALUATION COMPLEXITY: Moderate     GOALS: Goals reviewed with patient? Yes   SHORT TERM GOALS: Target date:    Patient will increase pain free lumbar flexion by 25%  Baseline: Goal status: INITIAL   2.  Patient will increase gross bilateral LE Strength by 5 lbs  Baseline:  Goal status: INITIAL   3.  Patient will tolerate base aquatic exercise program  Baseline:  Goal status: INITIAL LONG TERM GOALS: Target date: 08/12/2022     Patient will sit for > 1 hour without pain  Baseline:  Goal status: INITIAL   2.  Patient will stand for 20 minutes without pain I order to perform ADL's  Baseline:  Goal status: INITIAL   3.  Patient will walk community distances with minor pain  Baseline:  Goal status: INITIAL       PLAN:   PT FREQUENCY: 2x/week   PT DURATION: 8 weeks   PLANNED INTERVENTIONS: Therapeutic exercises, Therapeutic activity, Neuromuscular re-education, Balance training, Gait training, Patient/Family education, Self Care, Joint mobilization, Aquatic Therapy, Dry Needling, Cryotherapy, Moist heat, Ultrasound, and Manual therapy.   PLAN FOR NEXT SESSION:  Aquatic: work on core and hip stability. Patient has a large trigger point in right gluteal/ lower lumbar area; has pain with any prolonged position.    Rushie Chestnut) Russ Looper MPT 06/24/22 530p

## 2022-06-28 NOTE — Telephone Encounter (Signed)
Erroneous encounter will close.

## 2022-07-08 ENCOUNTER — Telehealth: Payer: Self-pay | Admitting: Pulmonary Disease

## 2022-07-08 DIAGNOSIS — G4733 Obstructive sleep apnea (adult) (pediatric): Secondary | ICD-10-CM

## 2022-07-08 NOTE — Telephone Encounter (Signed)
Working on this

## 2022-07-08 NOTE — Telephone Encounter (Signed)
Discussed home sleep test results with patient, AHI 55/hour. He did not tolerate PAP. He would be a candidate for inspire implantation. Proceed with referral to ENT.  We will also proceed with sleep endoscopy in the meantime.  Discussed risks and benefits of the procedure  Once data schedule, please ask him to hold Ozempic injections 1 week prior

## 2022-07-09 DIAGNOSIS — M5459 Other low back pain: Secondary | ICD-10-CM | POA: Diagnosis not present

## 2022-07-12 ENCOUNTER — Ambulatory Visit (HOSPITAL_BASED_OUTPATIENT_CLINIC_OR_DEPARTMENT_OTHER): Payer: PPO | Admitting: Physical Therapy

## 2022-07-14 ENCOUNTER — Encounter (HOSPITAL_BASED_OUTPATIENT_CLINIC_OR_DEPARTMENT_OTHER): Payer: Self-pay | Admitting: Physical Therapy

## 2022-07-14 ENCOUNTER — Ambulatory Visit (HOSPITAL_BASED_OUTPATIENT_CLINIC_OR_DEPARTMENT_OTHER): Payer: PPO | Attending: Orthopedic Surgery | Admitting: Physical Therapy

## 2022-07-14 DIAGNOSIS — R2689 Other abnormalities of gait and mobility: Secondary | ICD-10-CM | POA: Insufficient documentation

## 2022-07-14 DIAGNOSIS — M5459 Other low back pain: Secondary | ICD-10-CM | POA: Insufficient documentation

## 2022-07-14 NOTE — Therapy (Addendum)
OUTPATIENT PHYSICAL THERAPY THORACOLUMBAR   PHYSICAL THERAPY DISCHARGE SUMMARY  Visits from Start of Care: 3  Current functional level related to goals / functional outcomes: unknown   Remaining deficits: unknown   Education / Equipment: Discussed eval findings, rehab rationale.   Patient agrees to discharge. Patient goals were not met. Patient is being discharged due to not returning since the last visit.    Patient Name: Kenneth Fuller MRN: [id number] DOB:August 01, 1945, 77 y.o., male Today's Date: 07/14/2022   END OF SESSION:   PT End of Session - 07/14/22    Visit Number 3   Number of Visits 16   Date for PT Re-Evaluation 08/11/22   Authorization Type    PT Start Time 1108   PT Stop Time 1150   PT Time Calculation (min) 42 min   Activity Tolerance Tolerated well   Behavior During Therapy [facility]           Past Medical History:  Diagnosis Date   Allergy     Asthma     CAD (coronary artery disease)     CHF (congestive heart failure) (HCC)     COPD (chronic obstructive pulmonary disease) (HCC)     Diabetes mellitus     Fibromyalgia     GERD (gastroesophageal reflux disease)     GI bleeding     Gout     Hyperlipidemia     Hypertension     Hypogonadism male     Insomnia     MRSA cellulitis     Neuropathy     Obesity     Shortness of breath dyspnea      with exertion    Sleep apnea      cpap- 14    Wheezing      no asthma diagnosis         Past Surgical History:  Procedure Laterality Date   BACK SURGERY       BIOPSY   12/20/2019    Procedure: BIOPSY;  Surgeon: Rachael Fee, MD;  Location: [facility] ENDOSCOPY;  Service: Endoscopy;;   CARDIAC CATHETERIZATION       COLONOSCOPY       COLONOSCOPY WITH PROPOFOL N/A 12/20/2019    Procedure: COLONOSCOPY WITH PROPOFOL;  Surgeon: Rachael Fee, MD;  Location: [facility] ENDOS[facility];  Service: Endoscopy;  Laterality: N/A;   ESOPHAGOGASTRODUODENOSCOPY (EGD) WITH PROPOFOL N/A 12/20/2019    Procedure: ESOPHAGOGASTRODUODENOSCOPY  (EGD) WITH PROPOFOL;  Surgeon: Rachael Fee, MD;  Location: [facility];  Service: Endoscopy;  Laterality: N/A;   LEFT HEART CATH AND CORONARY ANGIOGRAPHY N/A 12/02/2016    Procedure: LEFT HEART CATH AND CORONARY ANGIOGRAPHY;  Surgeon: Corky Crafts, MD;  Location: [facility] INVASIVE CV LAB;  Service: Cardiovascular;  Laterality: N/A;   LUMBAR LAMINECTOMY/DECOMPRESSION MICRODISCECTOMY N/A 01/02/2014    Procedure: CENTRAL DECOMPRESSION LUMBAR LAMINECTOMY L3-L4, L4-L5;  Surgeon: Jacki Cones, MD;  Location: [facility] ORS;  Service: Orthopedics;  Laterality: N/A;   neck fusion       POLYPECTOMY   12/20/2019    Procedure: POLYPECTOMY;  Surgeon: Rachael Fee, MD;  Location: [facility] ENDOS[facility];  Service: Endoscopy;;        Patient Active Problem List    Diagnosis Date Noted   Chronic respiratory failure with hypoxia (HCC) 10/20/2020   Insomnia     CHF (congestive heart failure) (HCC) 01/06/2018   Depression with anxiety 01/04/2018   CKD (chronic kidney disease), stage III (HCC) 01/04/2018   Asthma-COPD overlap syndrome 01/04/2018  Depression, recurrent (HCC) 11/11/2017   Swelling of lower extremity 11/30/2016   Morbid obesity (HCC) 08/21/2015   Coronary artery disease involving native coronary artery of native heart without angina pectoris 12/05/2014   Urinary urgency 10/15/2014   Spinal stenosis, lumbar region, with neurogenic claudication 01/02/2014   Hyperlipidemia associated with type 2 diabetes mellitus (HCC) 10/25/2013   COLONIC POLYPS, ADENOMATOUS 02/25/2007   Diabetes mellitus type II, non insulin dependent (HCC) 02/25/2007   Gout 02/25/2007   Essential hypertension 02/25/2007   Cough variant asthma 02/25/2007   OSA on CPAP 02/25/2007   FATIGUE, CHRONIC 02/25/2007   HEADACHE, CHRONIC 02/25/2007   GERD (gastroesophageal reflux disease) 02/25/2007      PCP: Dr Ivin Booty [name] MD    REFERRING PROVIDER: Dr Venita Lick    REFERRING DIAG:  Diagnosis  M54.51 (ICD-10-CM) -  Vertebrogenic low back pain      Rationale for Evaluation and Treatment: Rehabilitation   THERAPY DIAG:  Other LBP ICD-10-CM: M54.59 Other abnormalities of gait and mobility   ONSET DATE: Several years    SUBJECTIVE:                                                                                                                                                                                            SUBJECTIVE STATEMENT: "No changes.  Felt about the same after last session not better or worse."  Initial Subjective statement The patient report a long history of low back pain. He had low back surgery in 2017. He had a lower lumbar fusuion. It helped for some time but now he is back to having significant pain in his back. The pain is located mostly in his right and radiates into his hip. Any prolonged position causes the pain. He has not had any physical therapy on his back.  He has a particular increase in pain when he lies down at night sleep.   PERTINENT HISTORY:  Cervical fusion (patient can not recall when; lumbar microdiskectomy 2015;    PAIN:  Are you having pain? Yes: NPRS scale: 5/10 Pain location: Right side lumbar spine Pain description: Aching Aggravating factors: Any, prolonged positioning Relieving factors: Rest   PRECAUTIONS: has had microdiskectomy/ decompression in the lower lumbar spine     WEIGHT BEARING RESTRICTIONS: No   FALLS:  Has patient fallen in last 6 months? No   LIVING ENVIRONMENT: OCCUPATION:  Retired   PLOF: Independent   PATIENT GOALS: To develop program to have less pain   NEXT MD VISIT: Nothing scheduled at this time   OBJECTIVE:    DIAGNOSTIC FINDINGS:  Nothing in the chart  PATIENT SURVEYS:  FOTO  Primary measure 38% with goal 46%   SCREENING FOR RED FLAGS: Bowel or bladder incontinence: No Spinal tumors: No Cauda equina syndrome: No Compression fracture: No Abdominal aneurysm: No   COGNITION: Overall cognitive status:  Within functional limits for tasks assessed                          SENSATION: Bilateral peripheral neuropathy   MUSCLE LENGTH:     POSTURE: rounded shoulders and forward head   PALPATION: Tenderness to palpation    LUMBAR ROM:    AROM eval  Flexion Limited 50% catching pain coming up from flexion  Extension Limited 50%  Right lateral flexion Limited 50% with pain on the right  Left lateral flexion Limited 50% with pain on the left  Right rotation Limited 50% with pain on the right  Left rotation Limited 50% with pain on the left   (Blank rows = not tested)   LOWER EXTREMITY ROM:      Passive  Right eval Left eval  Hip flexion      Hip extension      Hip abduction      Hip adduction      Hip internal rotation      Hip external rotation      Knee flexion      Knee extension      Ankle dorsiflexion      Ankle plantarflexion      Ankle inversion      Ankle eversion       (Blank rows = not tested)   LOWER EXTREMITY MMT:     MMT Right eval Left eval  Hip flexion 12 17.8  Hip extension      Hip abduction 12.5 18.1  Hip adduction      Hip internal rotation      Hip external rotation      Knee flexion      Knee extension 20 27.8  Ankle dorsiflexion      Ankle plantarflexion      Ankle inversion      Ankle eversion       (Blank rows = not tested) not tested secondary to time     GAIT: Flexed trunk; decreased bilateral hip flexion  TODAY'S TREATMENT:                                                                                                                              Pt seen for aquatic therapy today.  Treatment took place in water 3.5-4.75 ft in depth at the [facility] pool. Temp of water was 91.  Pt entered/exited the pool via stair using step through pattern with hand rail.  *walking unsupported 3.6 ft forward back and side stepping *L stretch *standing figure 4 stretch left *Seated on lift chair: cycling; hip add/abd; flutter/SLR 2  set x10-20 reps *Hamstring, gastroc, adductor and IT band  stretch using noodle in standing leaning against wall. *TrA set: solid noodle pull down wide stance then staggered x10 ea.  VC and demonstration for execution *core decompression yellow noodle wrapped anteriorly across chest. Added cycling; add/abd *walking between exercises for recovery throughout  Pt requires the buoyancy and hydrostatic pressure of water for support, and to offload joints by unweighting joint load by at least 50 % in navel deep water and by at least 75-80% in chest to neck deep water.  Viscosity of the water is needed for resistance of strengthening. Water current perturbations provides challenge to standing balance requiring increased core activation.       PATIENT EDUCATION:  Education details: HEP, symptom management; benefit of aquatics   Person educated: Patient Education method: Explanation, Demonstration, Tactile cues, Verbal cues, and Handouts Education comprehension: verbalized understanding   HOME EXERCISE PROGRAM:     ASSESSMENT:   CLINICAL IMPRESSION: Pt puts forth good effort today as he is directed through progressing core strengthening and stretching. Pt edu on management of condition direction of therapy.  Continues to wince with rle abduction movement and reports adductor pull with stretching.  Hip abd visually improves as session continues with decreased pain sx to 3/10. Goals ongoing   Initial clinical impression Patient is a 77 year old male presents with a longstanding history of lower back pain on the right side.  Pain can radiate into his right hip.  He has increased pain with any prolonged positioning with or be sitting or lying down, standing, walking.  He has limited lumbar flexion and bilateral rotation.  He has around a 30% strength deficit on the right compared to left.  He would benefit from skilled therapy to develop aquatic program to increase bilateral lower extremity strength and  improve general functional mobility. OBJECTIVE IMPAIRMENTS: Abnormal gait, decreased activity tolerance, decreased endurance, decreased knowledge of condition, decreased mobility, difficulty walking, decreased ROM, decreased strength, increased fascial restrictions, postural dysfunction, and pain.    ACTIVITY LIMITATIONS: carrying, lifting, bending, sitting, standing, squatting, sleeping, stairs, and locomotion level   PARTICIPATION LIMITATIONS: meal prep, cleaning, laundry, driving, shopping, and community activity   PERSONAL FACTORS: 3+ comorbidities: COPD, Lumbar fusion, cervical fusion   are also affecting patient's functional outcome.    REHAB POTENTIAL: Good   CLINICAL DECISION MAKING: Evolving/moderate complexity   EVALUATION COMPLEXITY: Moderate     GOALS: Goals reviewed with patient? Yes   SHORT TERM GOALS: Target date:    Patient will increase pain free lumbar flexion by 25%  Baseline: Goal status: INITIAL   2.  Patient will increase gross bilateral LE Strength by 5 lbs  Baseline:  Goal status: INITIAL   3.  Patient will tolerate base aquatic exercise program  Baseline:  Goal status: INITIAL LONG TERM GOALS: Target date: 08/12/2022     Patient will sit for > 1 hour without pain  Baseline:  Goal status: INITIAL   2.  Patient will stand for 20 minutes without pain I order to perform ADL's  Baseline:  Goal status: INITIAL   3.  Patient will walk community distances with minor pain  Baseline:  Goal status: INITIAL       PLAN:   PT FREQUENCY: 2x/week   PT DURATION: 8 weeks   PLANNED INTERVENTIONS: Therapeutic exercises, Therapeutic activity, Neuromuscular re-education, Balance training, Gait training, Patient/Family education, Self Care, Joint mobilization, Aquatic Therapy, Dry Needling, Cryotherapy, Moist heat, Ultrasound, and Manual therapy.   PLAN FOR NEXT SESSION:  Aquatic: work on core and hip stability.  Patient has a large trigger point in right  gluteal/ lower lumbar area; has pain with any prolonged position.    Corrie Dandy [facility]) Kyllie Pettijohn MPT 07/14/22 1208p  Addend Corrie Dandy Tomma Lightning) Lindzy Rupert MPT 08/24/22 150pm

## 2022-07-15 DIAGNOSIS — J449 Chronic obstructive pulmonary disease, unspecified: Secondary | ICD-10-CM | POA: Diagnosis not present

## 2022-07-15 DIAGNOSIS — G4733 Obstructive sleep apnea (adult) (pediatric): Secondary | ICD-10-CM | POA: Diagnosis not present

## 2022-07-15 DIAGNOSIS — R0902 Hypoxemia: Secondary | ICD-10-CM | POA: Diagnosis not present

## 2022-07-15 DIAGNOSIS — I5032 Chronic diastolic (congestive) heart failure: Secondary | ICD-10-CM | POA: Diagnosis not present

## 2022-07-16 DIAGNOSIS — M5459 Other low back pain: Secondary | ICD-10-CM | POA: Diagnosis not present

## 2022-07-16 DIAGNOSIS — M5451 Vertebrogenic low back pain: Secondary | ICD-10-CM | POA: Diagnosis not present

## 2022-07-19 ENCOUNTER — Ambulatory Visit (HOSPITAL_BASED_OUTPATIENT_CLINIC_OR_DEPARTMENT_OTHER): Payer: PPO | Admitting: Physical Therapy

## 2022-07-21 ENCOUNTER — Ambulatory Visit (HOSPITAL_BASED_OUTPATIENT_CLINIC_OR_DEPARTMENT_OTHER): Payer: PPO | Admitting: Physical Therapy

## 2022-07-21 ENCOUNTER — Other Ambulatory Visit: Payer: Self-pay

## 2022-07-21 ENCOUNTER — Ambulatory Visit (HOSPITAL_BASED_OUTPATIENT_CLINIC_OR_DEPARTMENT_OTHER): Payer: PPO | Admitting: Registered Nurse

## 2022-07-21 ENCOUNTER — Encounter (HOSPITAL_COMMUNITY)
Admission: RE | Disposition: A | Discharge status: Home / Self Care | Payer: Self-pay | Source: Home / Self Care | Attending: Pulmonary Disease

## 2022-07-21 ENCOUNTER — Encounter (HOSPITAL_COMMUNITY): Payer: Self-pay | Admitting: Pulmonary Disease

## 2022-07-21 ENCOUNTER — Ambulatory Visit (HOSPITAL_COMMUNITY): Payer: PPO | Admitting: Registered Nurse

## 2022-07-21 ENCOUNTER — Ambulatory Visit (HOSPITAL_COMMUNITY)
Admission: RE | Admit: 2022-07-21 | Discharge: 2022-07-21 | Disposition: A | Discharge status: Home / Self Care | Payer: PPO | Attending: Pulmonary Disease | Admitting: Pulmonary Disease

## 2022-07-21 DIAGNOSIS — K219 Gastro-esophageal reflux disease without esophagitis: Secondary | ICD-10-CM | POA: Insufficient documentation

## 2022-07-21 DIAGNOSIS — M797 Fibromyalgia: Secondary | ICD-10-CM | POA: Insufficient documentation

## 2022-07-21 DIAGNOSIS — G4733 Obstructive sleep apnea (adult) (pediatric): Secondary | ICD-10-CM

## 2022-07-21 DIAGNOSIS — R0602 Shortness of breath: Secondary | ICD-10-CM | POA: Diagnosis not present

## 2022-07-21 DIAGNOSIS — E669 Obesity, unspecified: Secondary | ICD-10-CM | POA: Insufficient documentation

## 2022-07-21 DIAGNOSIS — Z789 Other specified health status: Secondary | ICD-10-CM | POA: Diagnosis not present

## 2022-07-21 DIAGNOSIS — Z87891 Personal history of nicotine dependence: Secondary | ICD-10-CM | POA: Insufficient documentation

## 2022-07-21 DIAGNOSIS — Z7984 Long term (current) use of oral hypoglycemic drugs: Secondary | ICD-10-CM | POA: Insufficient documentation

## 2022-07-21 DIAGNOSIS — I5032 Chronic diastolic (congestive) heart failure: Secondary | ICD-10-CM | POA: Insufficient documentation

## 2022-07-21 DIAGNOSIS — F419 Anxiety disorder, unspecified: Secondary | ICD-10-CM | POA: Diagnosis not present

## 2022-07-21 DIAGNOSIS — I130 Hypertensive heart and chronic kidney disease with heart failure and stage 1 through stage 4 chronic kidney disease, or unspecified chronic kidney disease: Secondary | ICD-10-CM | POA: Insufficient documentation

## 2022-07-21 DIAGNOSIS — E1122 Type 2 diabetes mellitus with diabetic chronic kidney disease: Secondary | ICD-10-CM | POA: Insufficient documentation

## 2022-07-21 DIAGNOSIS — Z9989 Dependence on other enabling machines and devices: Secondary | ICD-10-CM | POA: Diagnosis not present

## 2022-07-21 DIAGNOSIS — F32A Depression, unspecified: Secondary | ICD-10-CM | POA: Diagnosis not present

## 2022-07-21 DIAGNOSIS — N183 Chronic kidney disease, stage 3 unspecified: Secondary | ICD-10-CM | POA: Diagnosis not present

## 2022-07-21 DIAGNOSIS — I509 Heart failure, unspecified: Secondary | ICD-10-CM | POA: Diagnosis not present

## 2022-07-21 DIAGNOSIS — I110 Hypertensive heart disease with heart failure: Secondary | ICD-10-CM | POA: Diagnosis not present

## 2022-07-21 DIAGNOSIS — Z6834 Body mass index (BMI) 34.0-34.9, adult: Secondary | ICD-10-CM | POA: Insufficient documentation

## 2022-07-21 DIAGNOSIS — J449 Chronic obstructive pulmonary disease, unspecified: Secondary | ICD-10-CM | POA: Diagnosis not present

## 2022-07-21 DIAGNOSIS — E785 Hyperlipidemia, unspecified: Secondary | ICD-10-CM | POA: Diagnosis not present

## 2022-07-21 DIAGNOSIS — E119 Type 2 diabetes mellitus without complications: Secondary | ICD-10-CM | POA: Diagnosis not present

## 2022-07-21 DIAGNOSIS — R519 Headache, unspecified: Secondary | ICD-10-CM | POA: Insufficient documentation

## 2022-07-21 DIAGNOSIS — I2510 Atherosclerotic heart disease of native coronary artery without angina pectoris: Secondary | ICD-10-CM | POA: Diagnosis not present

## 2022-07-21 HISTORY — PX: DRUG INDUCED ENDOSCOPY: SHX6808

## 2022-07-21 LAB — GLUCOSE, CAPILLARY
Glucose-Capillary: 65 mg/dL — ABNORMAL LOW (ref 70–99)
Glucose-Capillary: 85 mg/dL (ref 70–99)

## 2022-07-21 SURGERY — DRUG INDUCED SLEEP ENDOSCOPY
Anesthesia: General

## 2022-07-21 MED ORDER — PROPOFOL 500 MG/50ML IV EMUL
INTRAVENOUS | Status: DC | PRN
  Administered 2022-07-21: 75 ug/kg/min via INTRAVENOUS

## 2022-07-21 MED ORDER — LACTATED RINGERS IV SOLN: INTRAVENOUS | Status: DC

## 2022-07-21 MED ORDER — GLYCOPYRROLATE PF 0.2 MG/ML IJ SOSY
PREFILLED_SYRINGE | INTRAMUSCULAR | Status: DC | PRN
  Administered 2022-07-21 (×2): .1 mg via INTRAVENOUS

## 2022-07-21 MED ORDER — LIDOCAINE 2% (20 MG/ML) 5 ML SYRINGE
INTRAMUSCULAR | Status: DC | PRN
  Administered 2022-07-21: 100 mg via INTRAVENOUS

## 2022-07-21 MED ORDER — ONDANSETRON HCL 4 MG/2ML IJ SOLN
INTRAMUSCULAR | Status: DC | PRN
  Administered 2022-07-21: 4 mg via INTRAVENOUS

## 2022-07-21 MED ORDER — PROPOFOL 10 MG/ML IV BOLUS
INTRAVENOUS | Status: DC | PRN
  Administered 2022-07-21: 50 mg via INTRAVENOUS

## 2022-07-21 NOTE — Anesthesia Preprocedure Evaluation (Signed)
Anesthesia Evaluation  Patient identified by MRN, date of birth, ID band Patient awake    Reviewed: Allergy & Precautions, NPO status , Patient's Chart, lab work & pertinent test results  Airway Mallampati: III  TM Distance: >3 FB Neck ROM: Full    Dental no notable dental hx. (+) Upper Dentures, Lower Dentures, Dental Advisory Given   Pulmonary shortness of breath and with exertion, asthma , sleep apnea , COPD,  COPD inhaler, former smoker Intolerant of CPAP   Pulmonary exam normal breath sounds clear to auscultation       Cardiovascular hypertension, Pt. on medications + CAD and +CHF  Normal cardiovascular exam Rhythm:Regular Rate:Normal     Neuro/Psych  Headaches PSYCHIATRIC DISORDERS Anxiety Depression     Neuromuscular disease    GI/Hepatic ,GERD  Medicated,,  Endo/Other  diabetes, Well Controlled, Type 2, Oral Hypoglycemic Agents  Obesity Hyperlipidemia GLP-1 RA therapy - last dose more than 7 days ago  Renal/GU Renal disease  negative genitourinary   Musculoskeletal  (+)  Fibromyalgia -  Abdominal  (+) + obese  Peds  Hematology negative hematology ROS (+)   Anesthesia Other Findings   Reproductive/Obstetrics                              Anesthesia Physical Anesthesia Plan  ASA: 3  Anesthesia Plan: General   Post-op Pain Management: Minimal or no pain anticipated   Induction: Intravenous  PONV Risk Score and Plan: 2 and Treatment may vary due to age or medical condition and Propofol infusion  Airway Management Planned: Natural Airway  Additional Equipment: None  Intra-op Plan:   Post-operative Plan:   Informed Consent: I have reviewed the patients History and Physical, chart, labs and discussed the procedure including the risks, benefits and alternatives for the proposed anesthesia with the patient or authorized representative who has indicated his/her understanding  and acceptance.     Dental advisory given  Plan Discussed with: CRNA and Anesthesiologist  Anesthesia Plan Comments:          Anesthesia Quick Evaluation

## 2022-07-21 NOTE — Telephone Encounter (Signed)
He qualified with DISE Refer toENT

## 2022-07-21 NOTE — Discharge Instructions (Signed)
Resume normal diet. No driving or important decisions for 24 hours post sedation. Refer to 'Inspire' instruction sheet.

## 2022-07-21 NOTE — H&P (Signed)
Expand All Collapse All     Subjective:      Subjective [] Expand by Default Patient ID: Kenneth Fuller, male    DOB: 30-Oct-1945, 77 y.o.   MRN: [id number]   HPI   77 yo remote smoker for FU of asthma/ COPD and OSA Initial visit 08/2020, He has previously seen Dr. Marchelle Gearing and other providers at [facility] location -Doubt he has significant COPD.  Variability in lung function seems to suggest asthma. Feels daliresp really helped him subjectively    PMH - chronic diastolic heart failure coronary artery disease diabetes, stage3 CKD -asthma as a child , failed medical exam , recurred 2018          He smoked less than 10 pack years before he quit in 1971.  He reports lifelong history of asthma.  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        Chief Complaint  Patient presents with   Follow-up      Pt f/u states that breathing is fine and he is interested in getting the inspire device for his OSA    84-month follow-up visit uses O2 PRN during the day and at night time.  He reports nasal congestion during pollen season but breathing is okay In fact he is only using Symbicort on a as needed basis about twice a week.  He is using cetirizine and Flonase.  Has not needed albuterol much. He reports excessive night sweats, he is drenched and has to change his clothes.  He is again interested in hypoglossal nerve stimulator implant.  He has been unable to use his CPAP due to intolerance over the past 6 months.  He feels he just simply does not sleep well with PAP.  He has tried different types of mask interfaces   Significant tests/ events reviewed NPSG 06/2020 >> BiPAP 15/11 ,med FF mask 11/2015 NPSG  very severe OSA, AHI >100. Split night study showed optimal control with CPAP 5-20     PFTs 01/2021 no airway obstruction, moderate restriction ratio 74, FEV1 55%, FVC 53%, 12% bronchodilator response, FEV1 improved to 62%, DLCO 81%    12/2016  PFT >> ratio 84, severe restriction with reduced diffusion capacity.  Total lung capacity 72% FVC, 1.6 L / 40% and DLCO of 19.4/68%.   PFT 1/ 2019 showed FEV1 45%, ratio 68, FVC 48% consistent with moderate to severe airflow obstruction   Feno 01/26/2017 ->  8ppb     High resolution CT chest 01/2017 neg for ILD   CT angiogram chest 05/2020 clear lungs CT sinus March 09, 2017 clear sinuses   Review of Systems neg for any significant sore throat, dysphagia, itching, sneezing, nasal congestion or excess/ purulent secretions, fever, chills, sweats, unintended wt loss, pleuritic or exertional cp, hempoptysis, orthopnea pnd or change in chronic leg swelling. Also denies presyncope, palpitations, heartburn, abdominal pain, nausea, vomiting, diarrhea or change in bowel or urinary habits, dysuria,hematuria, rash, arthralgias, visual complaints, headache, numbness weakness or ataxia.    A/ Plan:   He has been unable to tolerate CPAP in spite of trying different mask interfaces.  He is agreeable to proceed with hypoglossal nerve stimulator implant.  We discussed risks and benefits. We will repeat home sleep test and see if he qualifies. If so, we will proceed with sleep endoscopy/DISE and ENT referral      Kenneth Fuller has presented today for procedure, with the  diagnosis of OSA. The various methods of treatment have been discussed with the patient and family. After consideration of risks, benefits and other options for treatment, the patient has consented to Procedure(s) :  DISE -sleep endoscopy .  The patient's history has been reviewed, patient examined, no change in status, stable for surgery. I have reviewed the patient's chart and labs. Questions were answered to the patient's satisfaction    Dreama Kuna V. Vassie Loll MD

## 2022-07-21 NOTE — Op Note (Signed)
Procedure: Evaluation of sleep-disordered breathing by examination of upper airway using an endoscope  CPT Codes: 16109 Evaluation of sleep-disordered breathing by examination of upper airway using an endoscope  Pre-Op Diagnose: Severe obstructive sleep apnea with positive airway pressure intolerance (ICD-10 G47.33).  Post-Op Diagnosis: Severe obstructive sleep apnea with positive pressure airway intolerance (ICD-10 G47.33).  ANESTHESIA: IV propofol  ESTIMATED BLOOD LOSS: None.  COMPLICATIONS: None.  BRIEF CLINICAL HISTORY: This is a 77 year old patient with a history of   severe symptomatic obstructive sleep apnea, who is intolerant  and unable to achieve benefit with positive pressure therapy.He presents today for drug-induced sleep endoscopy to better characterize locations and pattern of obstruction and to predict appropriate medical and/or surgical options moving forward.  PROCEDURE FINDINGS: There was no evidence of complete concentric palatal obstruction and he does appear to be a candidate anatomically for hypoglossal nerve stimulation therapy.  DESCRIPTION OF PROCEDURE: The patient was brought to the operating room and was anesthetized via the standard drug-induced sleep endoscopy protocol. The propofol infusion rate was started at 75 mcg and gradually increased to a level of 100 mcg, at which point, conditions that mimic sleep were gradually observed.   With the patient not responsive to verbal commands, but still with spontaneous respiration, sleep disordered breathing events and associated desaturations were clearly observed  Under these conditions, the flexible endoscope was inserted to examine both sides of the nose as well as the pharynx and larynx.   In summary, there was no evidence of complete concentric palatal obstruction and he does appear to be a candidate anatomically for hypoglossal nerve stimulation therapy.  I  performed the entire procedure.  Dictated By:  Comer Locket Vassie Loll MD  Post-Op Plan: Refer to ENT for inspire implant  Diagnostic Codes: G47.33 Obstructive sleep apnea (adult) (pediatric)

## 2022-07-21 NOTE — Transfer of Care (Addendum)
Immediate Anesthesia Transfer of Care Note  Patient: Kenneth Fuller  Procedure(s) Performed: DRUG INDUCED ENDOSCOPY  Patient Location: PACU  Anesthesia Type:General  Level of Consciousness: drowsy and patient cooperative  Airway & Oxygen Therapy: Patient Spontanous Breathing  Post-op Assessment: Report given to RN, Post -op Vital signs reviewed and stable, and Patient moving all extremities X 4  Post vital signs: Reviewed and stable  Last Vitals:  Vitals Value Taken Time  BP 130/66 07/21/22 1611  Temp    Pulse 87 07/21/22 1611  Resp 18 07/21/22 1611  SpO2 94 % 07/21/22 1611  Vitals shown include unvalidated device data.  Last Pain:  Vitals:   07/21/22 1101  TempSrc: Oral  PainSc: 4       Patients Stated Pain Goal: 4 (07/21/22 1101)  Complications: There were no known notable events for this encounter.

## 2022-07-21 NOTE — Anesthesia Postprocedure Evaluation (Signed)
Anesthesia Post Note  Patient: Kenneth Fuller  Procedure(s) Performed: DRUG INDUCED ENDOSCOPY     Patient location during evaluation: PACU Anesthesia Type: General Level of consciousness: awake and alert and oriented Pain management: pain level controlled Vital Signs Assessment: post-procedure vital signs reviewed and stable Respiratory status: spontaneous breathing, nonlabored ventilation and respiratory function stable Cardiovascular status: blood pressure returned to baseline and stable Postop Assessment: no apparent nausea or vomiting Anesthetic complications: no   There were no known notable events for this encounter.  Last Vitals:  Vitals:   07/21/22 1611 07/21/22 1620  BP: 130/66 (!) 153/88  Pulse: 88 87  Resp: 18 17  Temp: (!) 36.3 C   SpO2: 94% 96%    Last Pain:  Vitals:   07/21/22 1620  TempSrc:   PainSc: 0-No pain                 Meia Emley A.

## 2022-07-25 ENCOUNTER — Encounter (HOSPITAL_COMMUNITY): Payer: Self-pay | Admitting: Pulmonary Disease

## 2022-07-26 ENCOUNTER — Ambulatory Visit (HOSPITAL_BASED_OUTPATIENT_CLINIC_OR_DEPARTMENT_OTHER): Payer: PPO | Admitting: Physical Therapy

## 2022-07-28 ENCOUNTER — Ambulatory Visit (HOSPITAL_BASED_OUTPATIENT_CLINIC_OR_DEPARTMENT_OTHER): Payer: PPO | Admitting: Physical Therapy

## 2022-08-02 ENCOUNTER — Ambulatory Visit (HOSPITAL_BASED_OUTPATIENT_CLINIC_OR_DEPARTMENT_OTHER): Payer: PPO | Admitting: Physical Therapy

## 2022-08-04 ENCOUNTER — Ambulatory Visit (HOSPITAL_BASED_OUTPATIENT_CLINIC_OR_DEPARTMENT_OTHER): Payer: PPO | Admitting: Physical Therapy

## 2022-08-06 ENCOUNTER — Ambulatory Visit: Payer: PPO | Admitting: Family Medicine

## 2022-08-09 ENCOUNTER — Ambulatory Visit (HOSPITAL_BASED_OUTPATIENT_CLINIC_OR_DEPARTMENT_OTHER): Payer: PPO | Admitting: Physical Therapy

## 2022-08-11 ENCOUNTER — Ambulatory Visit (HOSPITAL_BASED_OUTPATIENT_CLINIC_OR_DEPARTMENT_OTHER): Payer: PPO | Admitting: Physical Therapy

## 2022-08-14 DIAGNOSIS — R0902 Hypoxemia: Secondary | ICD-10-CM | POA: Diagnosis not present

## 2022-08-14 DIAGNOSIS — J449 Chronic obstructive pulmonary disease, unspecified: Secondary | ICD-10-CM | POA: Diagnosis not present

## 2022-08-14 DIAGNOSIS — G4733 Obstructive sleep apnea (adult) (pediatric): Secondary | ICD-10-CM | POA: Diagnosis not present

## 2022-08-14 DIAGNOSIS — I5032 Chronic diastolic (congestive) heart failure: Secondary | ICD-10-CM | POA: Diagnosis not present

## 2022-08-18 ENCOUNTER — Ambulatory Visit: Payer: PPO | Attending: Cardiovascular Disease | Admitting: Cardiovascular Disease

## 2022-08-18 ENCOUNTER — Encounter: Payer: Self-pay | Admitting: Cardiovascular Disease

## 2022-08-18 VITALS — BP 120/64 | HR 69 | Ht 67.0 in | Wt 217.6 lb

## 2022-08-18 DIAGNOSIS — Z7984 Long term (current) use of oral hypoglycemic drugs: Secondary | ICD-10-CM | POA: Diagnosis not present

## 2022-08-18 DIAGNOSIS — J449 Chronic obstructive pulmonary disease, unspecified: Secondary | ICD-10-CM

## 2022-08-18 DIAGNOSIS — E668 Other obesity: Secondary | ICD-10-CM | POA: Diagnosis not present

## 2022-08-18 DIAGNOSIS — G4733 Obstructive sleep apnea (adult) (pediatric): Secondary | ICD-10-CM

## 2022-08-18 DIAGNOSIS — E119 Type 2 diabetes mellitus without complications: Secondary | ICD-10-CM | POA: Diagnosis not present

## 2022-08-18 DIAGNOSIS — I10 Essential (primary) hypertension: Secondary | ICD-10-CM | POA: Diagnosis not present

## 2022-08-18 DIAGNOSIS — I2510 Atherosclerotic heart disease of native coronary artery without angina pectoris: Secondary | ICD-10-CM | POA: Diagnosis not present

## 2022-08-18 DIAGNOSIS — I5032 Chronic diastolic (congestive) heart failure: Secondary | ICD-10-CM

## 2022-08-18 DIAGNOSIS — N182 Chronic kidney disease, stage 2 (mild): Secondary | ICD-10-CM

## 2022-08-18 DIAGNOSIS — E785 Hyperlipidemia, unspecified: Secondary | ICD-10-CM

## 2022-08-18 MED ORDER — HYDRALAZINE HCL 50 MG PO TABS: 50.0000 mg | ORAL_TABLET | Freq: Three times a day (TID) | ORAL | 3 refills | Status: DC

## 2022-08-18 NOTE — Progress Notes (Signed)
Cardiology Office Note    Date:  08/22/2022   ID:  Kenneth Fuller, DOB 1945-05-14, MRN [id number]  PCP:  [name], Elige Radon, MD  Cardiologist:   Thurmon Fair, MD   Chief Complaint  Patient presents with   Congestive Heart Failure     History of Present Illness:  Kenneth Fuller is a 77 y.o. male with chronic diastolic heart failure, morbid obesity, obstructive sleep apnea, hyperlipidemia, diabetes mellitus complicated by neuropathy, symptomatic orthostatic hypotension, minor nonobstructive coronary atherosclerosis, moderately severe restrictive lung disease.  He has a history of angioedema with ACE inhibitors.  He is doing better and feeling better this year.  On treatment with Marcelline Deist and Ozempic he is lost about 60 pounds.  His BMI is down to 34.  His current weight is well below our previous estimation of his "dry weight".  He has not taken any diuretics in a long time.  He does not have orthopnea, PND or lower extremity edema.  He has more energy and no longer complains of shortness of breath.  He has excellent blood pressure control.  He has not had problems with syncope or dizziness, denies palpitations.  He does not have lower extremity edema.  Past episodes of heart failure exacerbation manifested with chest tightness that resolved after diuresis.  Baseline BNP appears to be around 170. Coronary angiogram in October 2018 that showed a maximum stenosis of 10% in the mid LAD and mid RCA.  His echocardiogram showed evidence of normal left ventricular systolic function (EF 50-55%) and diastolic dysfunction with indeterminate filling pressures (E/e'=12-13).  He was hospitalized for severe shortness of breath from November 27 through January 09, 2018.   On admission his BNP was slightly elevated at 330 (previous baseline 72.6 in 2017).  An echo during that hospitalization which was a mediocre quality study but showed an ejection fraction decreased at 45-50%.  During his  hospitalization fo CHF in August 2020 the BNP was elevated to 176. He diuresed 10 liters.  He underwent cardiac catheterization in October 2018 he has preserved left ventricular systolic function and minimal coronary artery disease.  LVEDP was borderline at the time of catheterization (10-14 mmHg).  His echo did show grade 2 diastolic dysfunction.  However pulmonary function test had shown fairly severe obstructive lung disease with FEV1 in the 40-45% of predicted range.  He did not have much improvement with bronchodilators either on the PFTs or clinically.  He does have a history of roughly 5 pack years of smoking, but quit 40 years ago.  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  Chest CT did not show evidence of severe interstitial lung disease.    Past Medical History:  Diagnosis Date   Allergy    Asthma    CAD (coronary artery disease)    CHF (congestive heart failure) (HCC)    COPD (chronic obstructive pulmonary disease) (HCC)    Diabetes mellitus    Fibromyalgia    GERD (gastroesophageal reflux disease)    GI bleeding    Gout    Hyperlipidemia    Hypertension    Hypogonadism male    Insomnia    MRSA cellulitis    Neuropathy    Obesity    Shortness of breath dyspnea    with exertion    Sleep apnea    cpap- 14    Wheezing    no asthma diagnosis    Past Surgical History:  Procedure Laterality  Date   BACK SURGERY     BIOPSY  12/20/2019   Procedure: BIOPSY;  Surgeon: Rachael Fee, MD;  Location: [facility];  Service: Endoscopy;;   CARDIAC CATHETERIZATION     COLONOSCOPY     COLONOSCOPY WITH PROPOFOL N/A 12/20/2019   Procedure: COLONOSCOPY WITH PROPOFOL;  Surgeon: Rachael Fee, MD;  Location: [facility];  Service: Endoscopy;  Laterality: N/A;   DRUG INDUCED ENDOSCOPY N/A 07/21/2022   Procedure: DRUG INDUCED ENDOSCOPY;  Surgeon: Oretha Milch, MD;  Location: [facility] ENDOSCOPY;  Service: Pulmonary;  Laterality: N/A;    ESOPHAGOGASTRODUODENOSCOPY (EGD) WITH PROPOFOL N/A 12/20/2019   Procedure: ESOPHAGOGASTRODUODENOSCOPY (EGD) WITH PROPOFOL;  Surgeon: Rachael Fee, MD;  Location: [facility];  Service: Endoscopy;  Laterality: N/A;   LEFT HEART CATH AND CORONARY ANGIOGRAPHY N/A 12/02/2016   Procedure: LEFT HEART CATH AND CORONARY ANGIOGRAPHY;  Surgeon: Corky Crafts, MD;  Location: [facility] INVASIVE CV LAB;  Service: Cardiovascular;  Laterality: N/A;   LUMBAR LAMINECTOMY/DECOMPRESSION MICRODISCECTOMY N/A 01/02/2014   Procedure: CENTRAL DECOMPRESSION LUMBAR LAMINECTOMY L3-L4, L4-L5;  Surgeon: Jacki Cones, MD;  Location: [facility];  Service: Orthopedics;  Laterality: N/A;   neck fusion     POLYPECTOMY  12/20/2019   Procedure: POLYPECTOMY;  Surgeon: Rachael Fee, MD;  Location: [facility];  Service: Endoscopy;;    Current Medications: Outpatient Medications Prior to Visit  Medication Sig Dispense Refill   albuterol (PROVENTIL) (2.5 MG/3ML) 0.083% nebulizer solution USE 1 VIAL IN NEBULIZER EVERY 6 HOURS AS NEEDED FOR WHEEZING AND FOR SHORTNESS OF BREATH 180 mL 0   albuterol (VENTOLIN HFA) 108 (90 Base) MCG/ACT inhaler Inhale 2 puffs into the lungs every 6 (six) hours as needed for wheezing or shortness of breath. 1 each 2   atorvastatin (LIPITOR) 40 MG tablet Take 40 mg by mouth daily.     bisoprolol (ZEBETA) 5 MG tablet Take 1/2 (one-half) tablet by mouth once daily 45 tablet 3   Blood Glucose Monitoring Suppl (ONETOUCH VERIO FLEX SYSTEM) w/Device KIT Use to test blood sugar daily as directed. DX: E11.65 1 kit 0   cetirizine (ZYRTEC) 10 MG tablet Take 1 tablet by mouth once daily 90 tablet 3   Cholecalciferol (VITAMIN D3) 125 MCG (5000 UT) TABS Take 5,000 Units by mouth daily.     dapagliflozin propanediol (FARXIGA) 10 MG TABS tablet Take 1 tablet (10 mg total) by mouth daily. 90 tablet 11   fluticasone (FLONASE) 50 MCG/ACT nasal spray Place 2 sprays into both nostrils daily. (Patient taking  differently: Place 2 sprays into both nostrils daily as needed for allergies.) 16 g 6   GEMTESA 75 MG TABS Take 75 mg by mouth daily.     glucose blood (ONETOUCH VERIO) test strip Use to test blood sugar daily as directed. DX: E11.65 100 each 12   HYDROcodone-acetaminophen (NORCO/VICODIN) 5-325 MG tablet Take 1-2 tablets by mouth every 4 (four) hours as needed for severe pain.     isosorbide dinitrate (ISORDIL) 20 MG tablet Take 1 tablet (20 mg total) by mouth 3 (three) times daily. 270 tablet 3   Lancets (ONETOUCH DELICA PLUS LANCET33G) MISC Use to test blood sugar daily as directed. DX: E11.65 100 each 12   olmesartan (BENICAR) 20 MG tablet Take 1 tablet (20 mg total) by mouth daily. 90 tablet 3   omeprazole (PRILOSEC) 40 MG capsule TAKE 1 CAPSULE BY MOUTH ONCE DAILY 30-60  MINUTES  PRIOR  TO  BREAKFAST 30 capsule 11   OVER THE  COUNTER MEDICATION Take 3 capsules by mouth daily. Super Beets     Semaglutide,0.25 or 0.5MG /DOS, (OZEMPIC, 0.25 OR 0.5 MG/DOSE,) 2 MG/3ML SOPN Inject 0.5 mg into the skin once a week.     sertraline (ZOLOFT) 100 MG tablet Take 1 tablet (100 mg total) by mouth daily. 90 tablet 3   tamsulosin (FLOMAX) 0.4 MG CAPS capsule TAKE 1 CAPSULE BY MOUTH ONCE DAILY AFTER SUPPER 90 capsule 0   hydrALAZINE (APRESOLINE) 100 MG tablet Take 100 mg by mouth 3 (three) times daily.     furosemide (LASIX) 40 MG tablet Take 40 mg by mouth daily as needed (fluid retention.). (Patient not taking: Reported on 08/18/2022)     nitroGLYCERIN (NITROSTAT) 0.4 MG SL tablet Place 0.4 mg under the tongue every 5 (five) minutes as needed for chest pain. (Patient not taking: Reported on 08/18/2022)     roflumilast (DALIRESP) 500 MCG TABS tablet Take 1 tablet (500 mcg total) by mouth daily. 90 tablet 3   No facility-administered medications prior to visit.     Allergies:   Amlodipine besy-benazepril hcl, Hydrocodone, Oxycodone, and Phenergan [promethazine hcl]   Social History   Socioeconomic History    Marital status: Married    Spouse name: Lupita Leash   Number of children: 3   Years of education: 8   Highest education level: GED or equivalent  Occupational History   Occupation: retired/disability 1999    Employer: DISABLED    Comment: Textiles  Tobacco Use   Smoking status: Former    Current packs/day: 0.00    Average packs/day: 1 pack/day for 5.0 years (5.0 ttl pk-yrs)    Types: Cigarettes    Start date: 02/09/1964    Quit date: 02/08/1969    Years since quitting: 53.5   Smokeless tobacco: Former    Quit date: 1971  Advertising account planner   Vaping status: Never Used  Substance and Sexual Activity   Alcohol use: No    Alcohol/week: 0.0 standard drinks of alcohol   Drug use: No   Sexual activity: Not Currently  Other Topics Concern   Not on file  Social History Narrative   Drinks caffeine tea occasionally   Lives with wife Lupita Leash, daughter, and granddaughter    One son, Gaynelle Adu passed away last year suddenly   Social Determinants of Health   Financial Resource Strain: Low Risk  (08/12/2021)   Overall Financial Resource Strain (CARDIA)    Difficulty of Paying Living Expenses: Not hard at all  Food Insecurity: No Food Insecurity (08/12/2021)   Hunger Vital Sign    Worried About Running Out of Food in the Last Year: Never true    Ran Out of Food in the Last Year: Never true  Transportation Needs: No Transportation Needs (08/12/2021)   PRAPARE - Administrator, Civil Service (Medical): No    Lack of Transportation (Non-Medical): No  Physical Activity: Inactive (08/12/2021)   Exercise Vital Sign    Days of Exercise per Week: 0 days    Minutes of Exercise per Session: 0 min  Stress: No Stress Concern Present (08/12/2021)   [employer] of Occupational Health - Occupational Stress Questionnaire    Feeling of Stress : Only a little  Recent Concern: Stress - Stress Concern Present (06/25/2021)   [employer] of Occupational Health - Occupational Stress Questionnaire    Feeling of  Stress : To some extent  Social Connections: Socially Integrated (08/12/2021)   Social Connection and Isolation Panel [NHANES]    Frequency  of Communication with Friends and Family: More than three times a week    Frequency of Social Gatherings with Friends and Family: More than three times a week    Attends Religious Services: More than 4 times per year    Active Member of [employer] or Organizations: Yes    Attends Engineer, structural: More than 4 times per year    Marital Status: Married     Family History:  The patient's family history includes Colon cancer in his mother; Deep vein thrombosis in his brother; Diabetes in his father; Drug abuse in his sister; Heart attack in his father; Heart attack (age of onset: 76) in his son; Heart disease in his brother, brother, and father; Heart failure in his sister; Kidney disease in his sister.   ROS:   Please see the history of present illness.    ROS all other symptoms are reviewed and are negative  PHYSICAL EXAM:   VS:  BP 120/64 (BP Location: Left Arm, Patient Position: Sitting, Cuff Size: Large)   Pulse 69   Ht 5\' 7"  (1.702 m)   Wt 217 lb 9.6 oz (98.7 kg)   SpO2 95%   BMI 34.08 kg/m      General: Alert, oriented x3, no distress, severely obese Head: no evidence of trauma, PERRL, EOMI, no exophtalmos or lid lag, no myxedema, no xanthelasma; normal ears, nose and oropharynx Neck: normal jugular venous pulsations and no hepatojugular reflux; brisk carotid pulses without delay and no carotid bruits Chest: clear to auscultation, no signs of consolidation by percussion or palpation, normal fremitus, symmetrical and full respiratory excursions Cardiovascular: normal position and quality of the apical impulse, regular rhythm, normal first and second heart sounds, no murmurs, rubs or gallops Abdomen: no tenderness or distention, no masses by palpation, no abnormal pulsatility or arterial bruits, normal bowel sounds, no  hepatosplenomegaly Extremities: no clubbing, cyanosis or edema; 2+ radial, ulnar and brachial pulses bilaterally; 2+ right femoral, posterior tibial and dorsalis pedis pulses; 2+ left femoral, posterior tibial and dorsalis pedis pulses; no subclavian or femoral bruits Neurological: grossly nonfocal Psych: Normal mood and affect    Wt Readings from Last 3 Encounters:  08/18/22 217 lb 9.6 oz (98.7 kg)  07/21/22 218 lb (98.9 kg)  06/10/22 222 lb 1.6 oz (100.7 kg)      Studies/Labs Reviewed:  Echocardiogram 06/07/2020   1. Left ventricular ejection fraction, by estimation, is 55 to 60%. The  left ventricle has normal function. The left ventricle has no regional  wall motion abnormalities. There is mild left ventricular hypertrophy.  Left ventricular diastolic parameters  are consistent with Grade I diastolic dysfunction (impaired relaxation).   2. Right ventricular systolic function is normal. The right ventricular  size is normal.   3. Left atrial size was moderately dilated.   4. The mitral valve is grossly normal. Trivial mitral valve  regurgitation.   5. The aortic valve is tricuspid. Aortic valve regurgitation is not  visualized.   6. The inferior vena cava is normal in size with greater than 50%  respiratory variability, suggesting right atrial pressure of 3 mmHg.   Comparison(s): Changes from prior study are noted. 09/24/2018: LVEF 50-55%.  EKG:  EKG is ordered today and personally reviewed.  Shows normal sinus rhythm and is a normal tracing.  QTc 444 ms.  Recent Labs: 05/31/2022: ALT 14; BUN 16; Creatinine, Ser 1.18; Hemoglobin 15.7; Platelets 212; Potassium 4.3; Sodium 145   Lipid Panel    Component Value  Date/Time   CHOL 131 05/31/2022 1119   CHOL 111 06/12/2012 1232   TRIG 158 (H) 05/31/2022 1119   TRIG 141 01/24/2014 1143   TRIG 192 (H) 06/12/2012 1232   HDL 35 (L) 05/31/2022 1119   HDL 38 (L) 01/24/2014 1143   HDL 33 (L) 06/12/2012 1232   CHOLHDL 3.7 05/31/2022  1119   CHOLHDL 4.8 11/22/2006 1712   VLDL 39 11/22/2006 1712   LDLCALC 69 05/31/2022 1119   LDLCALC 27 10/25/2013 1224   LDLCALC 40 06/12/2012 1232    10/04/2019 hemoglobin A1c 5.8% 07/09/2020 hemoglobin A1c 7.9% 01/26/2021 hemoglobin A1c 7.4%  ASSESSMENT:    1. Chronic diastolic congestive heart failure (HCC)   2. Chronic obstructive pulmonary disease, unspecified COPD type (HCC)   3. OSA on CPAP   4. Essential hypertension   5. Coronary artery disease involving native coronary artery of native heart without angina pectoris   6. Dyslipidemia (high LDL; low HDL)   7. Diabetes mellitus type II, non insulin dependent (HCC)   8. Chronic kidney disease, stage 2, mildly decreased GFR   9. Moderate obesity      PLAN:  In order of problems listed above:  CHF: Normal left ventricular systolic function on most recent echocardiogram from less than a year ago in April 2022.  During his most recent evaluation for shortness of breath in September 2022 his BNP was very low at 66 (during episodes of heart failure exacerbation in the past values as high as 330 have been recorded).  Seems to have had benefit from treatment with SGLT2 inhibitor.  Although obesity makes for difficult exam, he appears to be clinically euvolemic.  He is steadily losing weight and we will have to constantly reassess our "dry weight".  At this point this appears to be around 250 pounds on his home scale.   COPD: Suspect this remains the most serious component of his functional limitation.  He had markedly abnormal PFTs in the past, FEV1 40-45% of normal.  Prefer to use bisoprolol since it is the most selective beta-blocker. OSA: He is planning to get an Inspire device. HTN: Allowing slightly higher systolic blood pressure to avoid symptomatic orthostatic hypotension.  With weight loss, his medication requirements are decreasing.  Will cut the hydralazine dose to 50 mg 3 times daily as. CAD: Currently without angina  pectoris.  His episodes of chest pain in the past have resolved with diuresis.   He had minimal CAD at catheterization in October 2018 showed only minor nonobstructive atherosclerosis, no progression from 2011 HLP: Chronically low HDL, excellent LDL cholesterol level.  Continue atorvastatin. DM: Most recent hemoglobin A1c is remarkably better down to 5.2%, improving normal range.  Has not had any episodes of hypoglycemia.  No longer on insulin. CKD2: Most recent creatinine 1.18.  He is not taking loop diuretics.  Stay well-hydrated while taking [place]. On ARB as well. Moderate obesity: excellent improvement on treatment with SGLT2 inhibitor and GLP-1 agonist.     Medication Adjustments/Labs and Tests Ordered: Current medicines are reviewed at length with the patient today.  Concerns regarding medicines are outlined above.  Medication changes, Labs and Tests ordered today are listed in the Patient Instructions below. Patient Instructions  Medication Instructions:  Decrease Hydralazine to 50 mg three times a day *If you need a refill on your cardiac medications before your next appointment, please call your pharmacy*  Follow-Up: At [facility], you and your health needs are our priority.  As part of  our continuing mission to provide you with exceptional heart care, we have created designated Provider Care Teams.  These Care Teams include your primary Cardiologist (physician) and Advanced Practice Providers (APPs -  Physician Assistants and Nurse Practitioners) who all work together to provide you with the care you need, when you need it.  We recommend signing up for the patient portal called "MyChart".  Sign up information is provided on this After Visit Summary.  MyChart is used to connect with patients for Virtual Visits (Telemedicine).  Patients are able to view lab/test results, encounter notes, upcoming appointments, etc.  Non-urgent messages can be sent to your provider as well.    To learn more about what you can do with MyChart, go to [email or web address].    Your next appointment:   1 year(s)  Provider:   Thurmon Fair, MD     Other Instructions Keep a BP log for 2 weeks- check it once a day. Bring the log to your wife's appt on July 22nd   Patient Instructions  Medication Instructions:  Decrease Hydralazine to 50 mg three times a day *If you need a refill on your cardiac medications before your next appointment, please call your pharmacy*  Follow-Up: At [facility], you and your health needs are our priority.  As part of our continuing mission to provide you with exceptional heart care, we have created designated Provider Care Teams.  These Care Teams include your primary Cardiologist (physician) and Advanced Practice Providers (APPs -  Physician Assistants and Nurse Practitioners) who all work together to provide you with the care you need, when you need it.  We recommend signing up for the patient portal called "MyChart".  Sign up information is provided on this After Visit Summary.  MyChart is used to connect with patients for Virtual Visits (Telemedicine).  Patients are able to view lab/test results, encounter notes, upcoming appointments, etc.  Non-urgent messages can be sent to your provider as well.   To learn more about what you can do with MyChart, go to [email or web address].    Your next appointment:   1 year(s)  Provider:   Thurmon Fair, MD     Other Instructions Keep a BP log for 2 weeks- check it once a day. Bring the log to your wife's appt on July 22nd    Signed, Thurmon Fair, MD  08/22/2022 4:00 PM    [facility] [address] [place], [place], [place]  [address] Phone: [phone]; Fax: [phone]

## 2022-08-18 NOTE — Patient Instructions (Addendum)
Medication Instructions:  Decrease Hydralazine to 50 mg three times a day *If you need a refill on your cardiac medications before your next appointment, please call your pharmacy*  Follow-Up: At [facility], you and your health needs are our priority.  As part of our continuing mission to provide you with exceptional heart care, we have created designated Provider Care Teams.  These Care Teams include your primary Cardiologist (physician) and Advanced Practice Providers (APPs -  Physician Assistants and Nurse Practitioners) who all work together to provide you with the care you need, when you need it.  We recommend signing up for the patient portal called "MyChart".  Sign up information is provided on this After Visit Summary.  MyChart is used to connect with patients for Virtual Visits (Telemedicine).  Patients are able to view lab/test results, encounter notes, upcoming appointments, etc.  Non-urgent messages can be sent to your provider as well.   To learn more about what you can do with MyChart, go to [email or web address].    Your next appointment:   1 year(s)  Provider:   Thurmon Fair, MD     Other Instructions Keep a BP log for 2 weeks- check it once a day. Bring the log to your wife's appt on July 22nd

## 2022-08-30 ENCOUNTER — Ambulatory Visit: Payer: PPO | Admitting: Family Medicine

## 2022-08-30 ENCOUNTER — Other Ambulatory Visit: Payer: Self-pay | Admitting: Family Medicine

## 2022-08-30 DIAGNOSIS — M5416 Radiculopathy, lumbar region: Secondary | ICD-10-CM | POA: Diagnosis not present

## 2022-08-30 DIAGNOSIS — R3915 Urgency of urination: Secondary | ICD-10-CM

## 2022-08-30 DIAGNOSIS — M79604 Pain in right leg: Secondary | ICD-10-CM | POA: Diagnosis not present

## 2022-08-30 DIAGNOSIS — R350 Frequency of micturition: Secondary | ICD-10-CM

## 2022-08-30 DIAGNOSIS — M545 Low back pain, unspecified: Secondary | ICD-10-CM | POA: Diagnosis not present

## 2022-08-30 MED ORDER — HYDRALAZINE HCL 50 MG PO TABS: 75.0000 mg | ORAL_TABLET | Freq: Three times a day (TID) | ORAL | Status: DC

## 2022-09-02 ENCOUNTER — Telehealth: Payer: Self-pay | Admitting: Pulmonary Disease

## 2022-09-02 NOTE — Telephone Encounter (Signed)
Patient called to check the status of a procedure he is supposed to have.  He stated that he has not heard anything from the doctor yet.  Please call to discuss further.  CB# [phone]

## 2022-09-03 ENCOUNTER — Ambulatory Visit: Payer: PPO | Admitting: Family Medicine

## 2022-09-06 NOTE — Telephone Encounter (Signed)
Called and spoke w/ Dr Jenne Pane office pt has all ready had his appt  for inspire in 05/24 , the referral  rep. Stated that she will put up an message to Dr Dionicio Stall nurse and the Rep for [facility] for them to call him regarding the next steps. Called and informed pt of this, advised pt that someone their office will contact him regarding. I told him if  don't hear anything please give our  office a call back.

## 2022-09-06 NOTE — Telephone Encounter (Signed)
Called and spoke with pt he said that he hasn't head any thing regard his referral for ENT appt for to discuss Inspire , pt was referral in 07/24/22. To Dr Jenne Pane ,I told pt that I would call their office to check the status  follow up w/ the patient.

## 2022-09-08 ENCOUNTER — Other Ambulatory Visit: Payer: Self-pay | Admitting: Otolaryngology

## 2022-09-08 ENCOUNTER — Telehealth: Payer: Self-pay | Admitting: Pulmonary Disease

## 2022-09-08 NOTE — Telephone Encounter (Signed)
ENT called back said disregard message

## 2022-09-08 NOTE — Telephone Encounter (Signed)
[facility] calling. Dr, Jenne Pane is req we do another Sleep Study and send him (Dr. Jenne Pane)  the results as the PT is considering the inspire.  Dr. Jenne Pane office would like [place] to call the PT for this appt/study. His # is [phone]

## 2022-09-12 ENCOUNTER — Other Ambulatory Visit: Payer: Self-pay | Admitting: Family Medicine

## 2022-09-12 DIAGNOSIS — E119 Type 2 diabetes mellitus without complications: Secondary | ICD-10-CM

## 2022-09-13 ENCOUNTER — Other Ambulatory Visit: Payer: Self-pay | Admitting: Family Medicine

## 2022-09-13 DIAGNOSIS — E119 Type 2 diabetes mellitus without complications: Secondary | ICD-10-CM

## 2022-09-14 DIAGNOSIS — G4733 Obstructive sleep apnea (adult) (pediatric): Secondary | ICD-10-CM | POA: Diagnosis not present

## 2022-09-14 DIAGNOSIS — J449 Chronic obstructive pulmonary disease, unspecified: Secondary | ICD-10-CM | POA: Diagnosis not present

## 2022-09-14 DIAGNOSIS — R0902 Hypoxemia: Secondary | ICD-10-CM | POA: Diagnosis not present

## 2022-09-14 DIAGNOSIS — I5032 Chronic diastolic (congestive) heart failure: Secondary | ICD-10-CM | POA: Diagnosis not present

## 2022-09-14 DIAGNOSIS — M5416 Radiculopathy, lumbar region: Secondary | ICD-10-CM | POA: Diagnosis not present

## 2022-09-20 ENCOUNTER — Ambulatory Visit (INDEPENDENT_AMBULATORY_CARE_PROVIDER_SITE_OTHER): Payer: PPO | Admitting: Family Medicine

## 2022-09-20 ENCOUNTER — Encounter: Payer: Self-pay | Admitting: Family Medicine

## 2022-09-20 VITALS — BP 115/73 | HR 64 | Temp 97.8°F | Resp 20 | Ht 67.0 in | Wt 228.0 lb

## 2022-09-20 DIAGNOSIS — E1142 Type 2 diabetes mellitus with diabetic polyneuropathy: Secondary | ICD-10-CM

## 2022-09-20 DIAGNOSIS — I10 Essential (primary) hypertension: Secondary | ICD-10-CM

## 2022-09-20 DIAGNOSIS — N1831 Chronic kidney disease, stage 3a: Secondary | ICD-10-CM

## 2022-09-20 DIAGNOSIS — N183 Chronic kidney disease, stage 3 unspecified: Secondary | ICD-10-CM | POA: Diagnosis not present

## 2022-09-20 DIAGNOSIS — E119 Type 2 diabetes mellitus without complications: Secondary | ICD-10-CM

## 2022-09-20 DIAGNOSIS — Z125 Encounter for screening for malignant neoplasm of prostate: Secondary | ICD-10-CM

## 2022-09-20 DIAGNOSIS — Z7985 Long-term (current) use of injectable non-insulin antidiabetic drugs: Secondary | ICD-10-CM | POA: Diagnosis not present

## 2022-09-20 DIAGNOSIS — E785 Hyperlipidemia, unspecified: Secondary | ICD-10-CM

## 2022-09-20 DIAGNOSIS — R3915 Urgency of urination: Secondary | ICD-10-CM | POA: Diagnosis not present

## 2022-09-20 DIAGNOSIS — E1169 Type 2 diabetes mellitus with other specified complication: Secondary | ICD-10-CM | POA: Diagnosis not present

## 2022-09-20 DIAGNOSIS — I129 Hypertensive chronic kidney disease with stage 1 through stage 4 chronic kidney disease, or unspecified chronic kidney disease: Secondary | ICD-10-CM | POA: Diagnosis not present

## 2022-09-20 DIAGNOSIS — E1122 Type 2 diabetes mellitus with diabetic chronic kidney disease: Secondary | ICD-10-CM | POA: Diagnosis not present

## 2022-09-20 DIAGNOSIS — R350 Frequency of micturition: Secondary | ICD-10-CM

## 2022-09-20 LAB — BAYER DCA HB A1C WAIVED: HB A1C (BAYER DCA - WAIVED): 5.2 % (ref 4.8–5.6)

## 2022-09-20 MED ORDER — ISOSORBIDE DINITRATE 20 MG PO TABS
20.0000 mg | ORAL_TABLET | Freq: Three times a day (TID) | ORAL | 3 refills | Status: DC
Start: 2022-09-20 — End: 2023-07-18

## 2022-09-20 MED ORDER — TAMSULOSIN HCL 0.4 MG PO CAPS: 0.4000 mg | ORAL_CAPSULE | Freq: Every day | ORAL | 3 refills | Status: DC

## 2022-09-20 NOTE — Progress Notes (Signed)
BP 115/73   Pulse 64   Temp 97.8 F (36.6 C) (Temporal)   Resp 20   Ht 5\' 7"  (1.702 m)   Wt 228 lb (103.4 kg)   SpO2 93%   BMI 35.71 kg/m    Subjective:   Patient ID: Kenneth Fuller, male    DOB: [birth year], 77 y.o.   MRN: [id number]  HPI: Kenneth Fuller is a 77 y.o. male presenting on 09/20/2022 for Medical Management of Chronic Issues   HPI Type 2 diabetes mellitus Patient comes in today for recheck of his diabetes. Patient has been currently taking [place] and Ozempic. Patient is currently on an ACE inhibitor/ARB. Patient has not seen an ophthalmologist this year. Patient denies any new issues with their feet. The symptom started onset as an adult hyperlipidemia and CAD and CHF hypertension ARE RELATED TO DM   Hypertension Patient is currently on hydralazine and Farxiga and Imdur and olmesartan and furosemide, and their blood pressure today is 115/73. Patient denies any lightheadedness or dizziness. Patient denies headaches, blurred vision, chest pains, shortness of breath, or weakness. Denies any side effects from medication and is content with current medication.   Hyperlipidemia Patient is coming in for recheck of his hyperlipidemia. The patient is currently taking atorvastatin. They deny any issues with myalgias or history of liver damage from it. They deny any focal numbness or weakness or chest pain.   COPD Patient is coming in for COPD recheck today.  He is currently on Daliresp and albuterol.  He has a mild chronic cough but denies any major coughing spells or wheezing spells.  He has 0 nighttime symptoms per week and 0 daytime symptoms per week currently.   Relevant past medical, surgical, family and social history reviewed and updated as indicated. Interim medical history since our last visit reviewed. Allergies and medications reviewed and updated.  Review of Systems  Constitutional:  Negative for chills and fever.  Eyes:  Negative for visual disturbance.   Respiratory:  Negative for shortness of breath and wheezing.   Cardiovascular:  Negative for chest pain and leg swelling.  Musculoskeletal:  Negative for back pain and gait problem.  Skin:  Negative for rash.  Neurological:  Negative for dizziness, weakness and light-headedness.  All other systems reviewed and are negative.   Per HPI unless specifically indicated above   Allergies as of 09/20/2022       Reactions   Amlodipine Besy-benazepril Hcl Swelling, Other (See Comments)   Makes tongue swell (lotrel)   Hydrocodone Itching   Can tolerate in low doses   Oxycodone Itching   Phenergan [promethazine Hcl] Other (See Comments)   "I can't remember."        Medication List        Accurate as of September 20, 2022 11:38 AM. If you have any questions, ask your nurse or doctor.          albuterol 108 (90 Base) MCG/ACT inhaler Commonly known as: VENTOLIN HFA Inhale 2 puffs into the lungs every 6 (six) hours as needed for wheezing or shortness of breath.   albuterol (2.5 MG/3ML) 0.083% nebulizer solution Commonly known as: PROVENTIL USE 1 VIAL IN NEBULIZER EVERY 6 HOURS AS NEEDED FOR WHEEZING AND FOR SHORTNESS OF BREATH   atorvastatin 40 MG tablet Commonly known as: LIPITOR Take 40 mg by mouth daily.   bisoprolol 5 MG tablet Commonly known as: ZEBETA Take 1/2 (one-half) tablet by mouth once daily   cetirizine 10 MG tablet  Commonly known as: ZYRTEC Take 1 tablet by mouth once daily   dapagliflozin propanediol 10 MG Tabs tablet Commonly known as: Farxiga Take 1 tablet (10 mg total) by mouth daily.   fluticasone 50 MCG/ACT nasal spray Commonly known as: FLONASE Place 2 sprays into both nostrils daily. What changed:  when to take this reasons to take this   furosemide 40 MG tablet Commonly known as: LASIX Take 40 mg by mouth daily as needed (fluid retention.).   Gemtesa 75 MG Tabs Generic drug: Vibegron Take 75 mg by mouth daily.   hydrALAZINE 50 MG  tablet Commonly known as: APRESOLINE Take 1.5 tablets (75 mg total) by mouth 3 (three) times daily.   HYDROcodone-acetaminophen 5-325 MG tablet Commonly known as: NORCO/VICODIN Take 1-2 tablets by mouth every 4 (four) hours as needed for severe pain.   isosorbide dinitrate 20 MG tablet Commonly known as: ISORDIL Take 1 tablet (20 mg total) by mouth 3 (three) times daily.   nitroGLYCERIN 0.4 MG SL tablet Commonly known as: NITROSTAT Place 0.4 mg under the tongue every 5 (five) minutes as needed for chest pain.   olmesartan 20 MG tablet Commonly known as: Benicar Take 1 tablet (20 mg total) by mouth daily.   omeprazole 40 MG capsule Commonly known as: PRILOSEC TAKE 1 CAPSULE BY MOUTH ONCE DAILY 30-60  MINUTES  PRIOR  TO  BREAKFAST   OneTouch Delica Plus Lancet33G Misc USE 1 LANCET TO CHECK GLUCOSE ONCE DAILY AS DIRECTED   OneTouch Verio Flex System w/Device Kit Use to test blood sugar daily as directed. DX: E11.65   OneTouch Verio test strip Generic drug: glucose blood test blood sugar daily as directed. DX: E11.65   OVER THE COUNTER MEDICATION Take 3 capsules by mouth daily. Super Beets   Ozempic (0.25 or 0.5 MG/DOSE) 2 MG/3ML Sopn Generic drug: Semaglutide(0.25 or 0.5MG /DOS) Inject 0.5 mg into the skin once a week.   roflumilast 500 MCG Tabs tablet Commonly known as: DALIRESP Take 1 tablet (500 mcg total) by mouth daily.   sertraline 100 MG tablet Commonly known as: ZOLOFT Take 1 tablet (100 mg total) by mouth daily.   tamsulosin 0.4 MG Caps capsule Commonly known as: FLOMAX Take 1 capsule (0.4 mg total) by mouth daily after supper. What changed: See the new instructions. Changed by: Elige Radon    Vitamin D3 125 MCG (5000 UT) Tabs Take 5,000 Units by mouth daily.         Objective:   BP 115/73   Pulse 64   Temp 97.8 F (36.6 C) (Temporal)   Resp 20   Ht 5\' 7"  (1.702 m)   Wt 228 lb (103.4 kg)   SpO2 93%   BMI 35.71 kg/m   Wt Readings  from Last 3 Encounters:  09/20/22 228 lb (103.4 kg)  08/18/22 217 lb 9.6 oz (98.7 kg)  07/21/22 218 lb (98.9 kg)    Physical Exam Vitals and nursing note reviewed.  Constitutional:      General: He is not in acute distress.    Appearance: He is well-developed. He is not diaphoretic.  Eyes:     General: No scleral icterus.    Conjunctiva/sclera: Conjunctivae normal.  Neck:     Thyroid: No thyromegaly.  Cardiovascular:     Rate and Rhythm: Normal rate and regular rhythm.     Heart sounds: Normal heart sounds. No murmur heard. Pulmonary:     Effort: Pulmonary effort is normal. No respiratory distress.     Breath  sounds: Normal breath sounds. No wheezing, rhonchi or rales.  Musculoskeletal:        General: No swelling. Normal range of motion.     Cervical back: Neck supple.  Lymphadenopathy:     Cervical: No cervical adenopathy.  Skin:    General: Skin is warm and dry.     Findings: No rash.  Neurological:     Mental Status: He is alert and oriented to person, place, and time.     Coordination: Coordination normal.  Psychiatric:        Behavior: Behavior normal.       Assessment & Plan:   Problem List Items Addressed This Visit       Cardiovascular and Mediastinum   Essential hypertension   Relevant Medications   isosorbide dinitrate (ISORDIL) 20 MG tablet   Other Relevant Orders   CBC with Differential/Platelet   CMP14+EGFR     Endocrine   Diabetes mellitus type II, non insulin dependent (HCC)   Hyperlipidemia associated with type 2 diabetes mellitus (HCC)   Relevant Medications   isosorbide dinitrate (ISORDIL) 20 MG tablet   Other Relevant Orders   Lipid panel     Genitourinary   CKD (chronic kidney disease), stage III (HCC)     Other   Urinary urgency   Relevant Medications   tamsulosin (FLOMAX) 0.4 MG CAPS capsule   Other Visit Diagnoses     Type 2 diabetes mellitus with diabetic polyneuropathy, without long-term current use of insulin (HCC)    -   Primary   Relevant Orders   Bayer DCA Hb A1c Waived   Screening for prostate cancer       Relevant Orders   PSA, total and free   Urinary frequency       Relevant Medications   tamsulosin (FLOMAX) 0.4 MG CAPS capsule       A1c is 5.2, blood pressure and everything looks good.  Patient denies any major issues. Follow up plan: Return in about 3 months (around 12/21/2022), or if symptoms worsen or fail to improve, for Diabetes and COPD recheck.  Counseling provided for all of the vaccine components Orders Placed This Encounter  Procedures   Bayer DCA Hb A1c Waived   CBC with Differential/Platelet   CMP14+EGFR   Lipid panel   PSA, total and free    Arville Care, MD [facility] 09/20/2022, 11:39 AM

## 2022-09-26 ENCOUNTER — Other Ambulatory Visit: Payer: Self-pay | Admitting: Gastroenterology

## 2022-09-27 NOTE — Telephone Encounter (Signed)
Dr Leonides Schanz,  This is a patient of Dr Christella Hartigan.  Please advise on refills as you are DOD am.  Thank you

## 2022-10-05 DIAGNOSIS — M5416 Radiculopathy, lumbar region: Secondary | ICD-10-CM | POA: Diagnosis not present

## 2022-10-12 ENCOUNTER — Other Ambulatory Visit: Payer: PPO

## 2022-10-12 DIAGNOSIS — R809 Proteinuria, unspecified: Secondary | ICD-10-CM | POA: Diagnosis not present

## 2022-10-12 DIAGNOSIS — N182 Chronic kidney disease, stage 2 (mild): Secondary | ICD-10-CM | POA: Diagnosis not present

## 2022-10-12 DIAGNOSIS — D631 Anemia in chronic kidney disease: Secondary | ICD-10-CM | POA: Diagnosis not present

## 2022-10-15 DIAGNOSIS — I5032 Chronic diastolic (congestive) heart failure: Secondary | ICD-10-CM | POA: Diagnosis not present

## 2022-10-15 DIAGNOSIS — E1122 Type 2 diabetes mellitus with diabetic chronic kidney disease: Secondary | ICD-10-CM | POA: Diagnosis not present

## 2022-10-15 DIAGNOSIS — J449 Chronic obstructive pulmonary disease, unspecified: Secondary | ICD-10-CM | POA: Diagnosis not present

## 2022-10-15 DIAGNOSIS — I129 Hypertensive chronic kidney disease with stage 1 through stage 4 chronic kidney disease, or unspecified chronic kidney disease: Secondary | ICD-10-CM | POA: Diagnosis not present

## 2022-10-15 DIAGNOSIS — R0902 Hypoxemia: Secondary | ICD-10-CM | POA: Diagnosis not present

## 2022-10-15 DIAGNOSIS — N182 Chronic kidney disease, stage 2 (mild): Secondary | ICD-10-CM | POA: Diagnosis not present

## 2022-10-15 DIAGNOSIS — G4733 Obstructive sleep apnea (adult) (pediatric): Secondary | ICD-10-CM | POA: Diagnosis not present

## 2022-10-15 DIAGNOSIS — E559 Vitamin D deficiency, unspecified: Secondary | ICD-10-CM | POA: Diagnosis not present

## 2022-10-19 ENCOUNTER — Other Ambulatory Visit: Payer: Self-pay

## 2022-10-19 ENCOUNTER — Encounter (HOSPITAL_BASED_OUTPATIENT_CLINIC_OR_DEPARTMENT_OTHER): Payer: Self-pay | Admitting: Otolaryngology

## 2022-10-19 NOTE — Progress Notes (Signed)
   10/19/22 1558  PAT Phone Screen  Is the patient taking a GLP-1 receptor agonist? (S)  Yes (Ozempic on Sundays)  Has the patient been informed on holding medication? Yes  Do You Have Diabetes? Yes  Do You Have Hypertension? Yes  Have You Ever Been to the ER for Asthma? No  Have You Taken Oral Steroids in the Past 3 Months? No  Do you Take Phenteramine or any Other Diet Drugs? No  Recent  Lab Work, EKG, CXR? Yes  Where was this test performed? 08-18-22 EKG, 09-20-22 A1c 5.2  Do you have a history of heart problems? Yes  Cardiologist Name Dr Royann Shivers for CHF  Have you ever had tests on your heart? Yes  What cardiac tests were performed? Echo  What date/year were cardiac tests completed? 06-07-20 ECHO EF 55-60%  Results viewable: [facility] Media Tab  Any Recent Hospitalizations? No  Height 5\' 7"  (1.702 m)  Weight 103.4 kg  Pat Appointment Scheduled (S)  Yes (BMP)

## 2022-10-22 ENCOUNTER — Encounter (HOSPITAL_BASED_OUTPATIENT_CLINIC_OR_DEPARTMENT_OTHER)
Admission: RE | Admit: 2022-10-22 | Discharge: 2022-10-22 | Disposition: A | Discharge status: Home / Self Care | Payer: PPO | Source: Ambulatory Visit | Attending: Otolaryngology

## 2022-10-22 DIAGNOSIS — Z01812 Encounter for preprocedural laboratory examination: Secondary | ICD-10-CM | POA: Diagnosis not present

## 2022-10-22 DIAGNOSIS — M5451 Vertebrogenic low back pain: Secondary | ICD-10-CM | POA: Diagnosis not present

## 2022-10-22 DIAGNOSIS — M4316 Spondylolisthesis, lumbar region: Secondary | ICD-10-CM | POA: Diagnosis not present

## 2022-10-22 DIAGNOSIS — E119 Type 2 diabetes mellitus without complications: Secondary | ICD-10-CM | POA: Insufficient documentation

## 2022-10-22 LAB — BASIC METABOLIC PANEL
Anion gap: 8 (ref 5–15)
BUN: 15 mg/dL (ref 8–23)
CO2: 26 mmol/L (ref 22–32)
Calcium: 8.7 mg/dL — ABNORMAL LOW (ref 8.9–10.3)
Chloride: 105 mmol/L (ref 98–111)
Creatinine, Ser: 1.22 mg/dL (ref 0.61–1.24)
GFR, Estimated: >60 mL/min (ref >60)
Glucose, Bld: 83 mg/dL (ref 70–99)
Potassium: 4.5 mmol/L (ref 3.5–5.1)
Sodium: 139 mmol/L (ref 135–145)

## 2022-10-22 NOTE — Progress Notes (Signed)

## 2022-10-26 ENCOUNTER — Other Ambulatory Visit: Payer: Self-pay

## 2022-10-26 ENCOUNTER — Encounter (HOSPITAL_BASED_OUTPATIENT_CLINIC_OR_DEPARTMENT_OTHER)
Admission: RE | Disposition: A | Discharge status: Home / Self Care | Payer: Self-pay | Source: Home / Self Care | Attending: Otolaryngology

## 2022-10-26 ENCOUNTER — Ambulatory Visit (HOSPITAL_COMMUNITY): Payer: PPO

## 2022-10-26 ENCOUNTER — Ambulatory Visit (HOSPITAL_BASED_OUTPATIENT_CLINIC_OR_DEPARTMENT_OTHER): Payer: PPO | Admitting: Anesthesiology

## 2022-10-26 ENCOUNTER — Ambulatory Visit (HOSPITAL_BASED_OUTPATIENT_CLINIC_OR_DEPARTMENT_OTHER)
Admission: RE | Admit: 2022-10-26 | Discharge: 2022-10-26 | Disposition: A | Discharge status: Home / Self Care | Payer: PPO | Attending: Otolaryngology | Admitting: Otolaryngology

## 2022-10-26 ENCOUNTER — Encounter (HOSPITAL_BASED_OUTPATIENT_CLINIC_OR_DEPARTMENT_OTHER): Payer: Self-pay | Admitting: Otolaryngology

## 2022-10-26 ENCOUNTER — Telehealth: Payer: Self-pay

## 2022-10-26 DIAGNOSIS — N183 Chronic kidney disease, stage 3 unspecified: Secondary | ICD-10-CM | POA: Diagnosis not present

## 2022-10-26 DIAGNOSIS — F419 Anxiety disorder, unspecified: Secondary | ICD-10-CM | POA: Insufficient documentation

## 2022-10-26 DIAGNOSIS — J449 Chronic obstructive pulmonary disease, unspecified: Secondary | ICD-10-CM | POA: Insufficient documentation

## 2022-10-26 DIAGNOSIS — I130 Hypertensive heart and chronic kidney disease with heart failure and stage 1 through stage 4 chronic kidney disease, or unspecified chronic kidney disease: Secondary | ICD-10-CM | POA: Diagnosis not present

## 2022-10-26 DIAGNOSIS — G4733 Obstructive sleep apnea (adult) (pediatric): Secondary | ICD-10-CM | POA: Insufficient documentation

## 2022-10-26 DIAGNOSIS — R0602 Shortness of breath: Secondary | ICD-10-CM | POA: Insufficient documentation

## 2022-10-26 DIAGNOSIS — E669 Obesity, unspecified: Secondary | ICD-10-CM | POA: Insufficient documentation

## 2022-10-26 DIAGNOSIS — Z87891 Personal history of nicotine dependence: Secondary | ICD-10-CM | POA: Insufficient documentation

## 2022-10-26 DIAGNOSIS — E119 Type 2 diabetes mellitus without complications: Secondary | ICD-10-CM | POA: Insufficient documentation

## 2022-10-26 DIAGNOSIS — R519 Headache, unspecified: Secondary | ICD-10-CM | POA: Insufficient documentation

## 2022-10-26 DIAGNOSIS — K219 Gastro-esophageal reflux disease without esophagitis: Secondary | ICD-10-CM | POA: Diagnosis not present

## 2022-10-26 DIAGNOSIS — I509 Heart failure, unspecified: Secondary | ICD-10-CM | POA: Insufficient documentation

## 2022-10-26 DIAGNOSIS — Z789 Other specified health status: Secondary | ICD-10-CM | POA: Diagnosis not present

## 2022-10-26 DIAGNOSIS — M797 Fibromyalgia: Secondary | ICD-10-CM | POA: Diagnosis not present

## 2022-10-26 DIAGNOSIS — Z6835 Body mass index (BMI) 35.0-35.9, adult: Secondary | ICD-10-CM | POA: Insufficient documentation

## 2022-10-26 DIAGNOSIS — F32A Depression, unspecified: Secondary | ICD-10-CM | POA: Insufficient documentation

## 2022-10-26 DIAGNOSIS — E785 Hyperlipidemia, unspecified: Secondary | ICD-10-CM | POA: Diagnosis not present

## 2022-10-26 DIAGNOSIS — I2510 Atherosclerotic heart disease of native coronary artery without angina pectoris: Secondary | ICD-10-CM | POA: Insufficient documentation

## 2022-10-26 DIAGNOSIS — Z7984 Long term (current) use of oral hypoglycemic drugs: Secondary | ICD-10-CM | POA: Insufficient documentation

## 2022-10-26 DIAGNOSIS — I110 Hypertensive heart disease with heart failure: Secondary | ICD-10-CM | POA: Diagnosis not present

## 2022-10-26 DIAGNOSIS — Z7985 Long-term (current) use of injectable non-insulin antidiabetic drugs: Secondary | ICD-10-CM | POA: Insufficient documentation

## 2022-10-26 HISTORY — DX: Depression, unspecified: F32.A

## 2022-10-26 HISTORY — PX: IMPLANTATION OF HYPOGLOSSAL NERVE STIMULATOR: SHX6827

## 2022-10-26 HISTORY — DX: Anxiety disorder, unspecified: F41.9

## 2022-10-26 LAB — GLUCOSE, CAPILLARY
Glucose-Capillary: 88 mg/dL (ref 70–99)
Glucose-Capillary: 116 mg/dL — ABNORMAL HIGH (ref 70–99)

## 2022-10-26 SURGERY — INSERTION, HYPOGLOSSAL NERVE STIMULATOR
Anesthesia: General | Site: Neck | Laterality: Right

## 2022-10-26 MED ORDER — OXYCODONE HCL 5 MG/5ML PO SOLN: 5.0000 mg | Freq: Once | ORAL | Status: DC | PRN

## 2022-10-26 MED ORDER — FENTANYL CITRATE (PF) 250 MCG/5ML IJ SOLN
INTRAMUSCULAR | Status: DC | PRN
  Administered 2022-10-26: 100 ug via INTRAVENOUS

## 2022-10-26 MED ORDER — ACETAMINOPHEN 10 MG/ML IV SOLN
INTRAVENOUS | Status: DC | PRN
Start: 2022-10-26 — End: 2022-10-26
  Administered 2022-10-26: 1000 mg via INTRAVENOUS

## 2022-10-26 MED ORDER — PHENYLEPHRINE 80 MCG/ML (10ML) SYRINGE FOR IV PUSH (FOR BLOOD PRESSURE SUPPORT)
PREFILLED_SYRINGE | INTRAVENOUS | Status: AC
  Filled 2022-10-26: qty 30

## 2022-10-26 MED ORDER — ACETAMINOPHEN 10 MG/ML IV SOLN
INTRAVENOUS | Status: AC
  Filled 2022-10-26: qty 100

## 2022-10-26 MED ORDER — ACETAMINOPHEN 160 MG/5ML PO SOLN: 325.0000 mg | ORAL | Status: DC | PRN

## 2022-10-26 MED ORDER — CEFAZOLIN SODIUM-DEXTROSE 2-4 GM/100ML-% IV SOLN
INTRAVENOUS | Status: AC
  Filled 2022-10-26: qty 100

## 2022-10-26 MED ORDER — ROCURONIUM BROMIDE 10 MG/ML (PF) SYRINGE
PREFILLED_SYRINGE | INTRAVENOUS | Status: AC
  Filled 2022-10-26: qty 20

## 2022-10-26 MED ORDER — MEPERIDINE HCL 25 MG/ML IJ SOLN: 6.2500 mg | INTRAMUSCULAR | Status: DC | PRN

## 2022-10-26 MED ORDER — ONDANSETRON HCL 4 MG/2ML IJ SOLN
INTRAMUSCULAR | Status: AC
  Filled 2022-10-26: qty 2

## 2022-10-26 MED ORDER — LIDOCAINE 2% (20 MG/ML) 5 ML SYRINGE
INTRAMUSCULAR | Status: AC
  Filled 2022-10-26: qty 5

## 2022-10-26 MED ORDER — CELECOXIB 200 MG PO CAPS: 200.0000 mg | ORAL_CAPSULE | Freq: Once | ORAL | Status: DC

## 2022-10-26 MED ORDER — DEXAMETHASONE SODIUM PHOSPHATE 10 MG/ML IJ SOLN
INTRAMUSCULAR | Status: DC | PRN
  Administered 2022-10-26: 5 mg via INTRAVENOUS

## 2022-10-26 MED ORDER — MIDAZOLAM HCL 2 MG/2ML IJ SOLN
INTRAMUSCULAR | Status: AC
  Filled 2022-10-26: qty 2

## 2022-10-26 MED ORDER — FENTANYL CITRATE (PF) 100 MCG/2ML IJ SOLN
INTRAMUSCULAR | Status: AC
  Filled 2022-10-26: qty 2

## 2022-10-26 MED ORDER — PROPOFOL 10 MG/ML IV BOLUS
INTRAVENOUS | Status: AC
  Filled 2022-10-26: qty 20

## 2022-10-26 MED ORDER — LIDOCAINE-EPINEPHRINE 1 %-1:100000 IJ SOLN
INTRAMUSCULAR | Status: AC
  Filled 2022-10-26: qty 1

## 2022-10-26 MED ORDER — ACETAMINOPHEN 500 MG PO TABS: 1000.0000 mg | ORAL_TABLET | Freq: Once | ORAL | Status: DC

## 2022-10-26 MED ORDER — EPHEDRINE 5 MG/ML INJ
INTRAVENOUS | Status: AC
  Filled 2022-10-26: qty 5

## 2022-10-26 MED ORDER — MIDAZOLAM HCL 5 MG/5ML IJ SOLN
INTRAMUSCULAR | Status: DC | PRN
Start: 2022-10-26 — End: 2022-10-26
  Administered 2022-10-26: 2 mg via INTRAVENOUS

## 2022-10-26 MED ORDER — OXYCODONE HCL 5 MG PO TABS: 5.0000 mg | ORAL_TABLET | Freq: Once | ORAL | Status: DC | PRN

## 2022-10-26 MED ORDER — 0.9 % SODIUM CHLORIDE (POUR BTL) OPTIME
TOPICAL | Status: DC | PRN
  Administered 2022-10-26: 200 mL

## 2022-10-26 MED ORDER — ONDANSETRON HCL 4 MG/2ML IJ SOLN
4.0000 mg | Freq: Once | INTRAMUSCULAR | Status: AC | PRN
  Administered 2022-10-26: 4 mg via INTRAVENOUS

## 2022-10-26 MED ORDER — EPHEDRINE SULFATE-NACL 50-0.9 MG/10ML-% IV SOSY
PREFILLED_SYRINGE | INTRAVENOUS | Status: DC | PRN
  Administered 2022-10-26: 5 mg via INTRAVENOUS
  Administered 2022-10-26: 10 mg via INTRAVENOUS
  Administered 2022-10-26: 5 mg via INTRAVENOUS

## 2022-10-26 MED ORDER — DEXMEDETOMIDINE HCL IN NACL 80 MCG/20ML IV SOLN
INTRAVENOUS | Status: AC
  Filled 2022-10-26: qty 20

## 2022-10-26 MED ORDER — PHENYLEPHRINE 80 MCG/ML (10ML) SYRINGE FOR IV PUSH (FOR BLOOD PRESSURE SUPPORT)
PREFILLED_SYRINGE | INTRAVENOUS | Status: AC
  Filled 2022-10-26: qty 10

## 2022-10-26 MED ORDER — SUCCINYLCHOLINE CHLORIDE 200 MG/10ML IV SOSY
PREFILLED_SYRINGE | INTRAVENOUS | Status: AC
  Filled 2022-10-26: qty 10

## 2022-10-26 MED ORDER — FENTANYL CITRATE (PF) 100 MCG/2ML IJ SOLN
25.0000 ug | INTRAMUSCULAR | Status: DC | PRN
  Administered 2022-10-26 (×2): 50 ug via INTRAVENOUS

## 2022-10-26 MED ORDER — ACETAMINOPHEN 325 MG PO TABS: 325.0000 mg | ORAL_TABLET | ORAL | Status: DC | PRN

## 2022-10-26 MED ORDER — LIDOCAINE 2% (20 MG/ML) 5 ML SYRINGE
INTRAMUSCULAR | Status: DC | PRN
  Administered 2022-10-26 (×2): 40 mg via INTRAVENOUS

## 2022-10-26 MED ORDER — PHENYLEPHRINE 80 MCG/ML (10ML) SYRINGE FOR IV PUSH (FOR BLOOD PRESSURE SUPPORT)
PREFILLED_SYRINGE | INTRAVENOUS | Status: DC | PRN
  Administered 2022-10-26: 80 ug via INTRAVENOUS
  Administered 2022-10-26 (×4): 160 ug via INTRAVENOUS

## 2022-10-26 MED ORDER — SUCCINYLCHOLINE 20MG/ML (10ML) SYRINGE FOR MEDFUSION PUMP - OPTIME
INTRAMUSCULAR | Status: DC | PRN
  Administered 2022-10-26: 140 mg via INTRAVENOUS

## 2022-10-26 MED ORDER — LIDOCAINE-EPINEPHRINE 1 %-1:100000 IJ SOLN
INTRAMUSCULAR | Status: DC | PRN
  Administered 2022-10-26: 6 mL

## 2022-10-26 MED ORDER — SUGAMMADEX SODIUM 200 MG/2ML IV SOLN
INTRAVENOUS | Status: DC | PRN
  Administered 2022-10-26: 300 mg via INTRAVENOUS

## 2022-10-26 MED ORDER — CEFAZOLIN SODIUM-DEXTROSE 2-4 GM/100ML-% IV SOLN
2.0000 g | INTRAVENOUS | Status: AC
  Administered 2022-10-26: 2 g via INTRAVENOUS

## 2022-10-26 MED ORDER — PROPOFOL 10 MG/ML IV BOLUS
INTRAVENOUS | Status: DC | PRN
  Administered 2022-10-26: 150 mg via INTRAVENOUS

## 2022-10-26 MED ORDER — DEXAMETHASONE SODIUM PHOSPHATE 10 MG/ML IJ SOLN
INTRAMUSCULAR | Status: AC
  Filled 2022-10-26: qty 1

## 2022-10-26 MED ORDER — ROCURONIUM BROMIDE 10 MG/ML (PF) SYRINGE
PREFILLED_SYRINGE | INTRAVENOUS | Status: DC | PRN
  Administered 2022-10-26: 10 mg via INTRAVENOUS

## 2022-10-26 MED ORDER — LACTATED RINGERS IV SOLN: INTRAVENOUS | Status: DC

## 2022-10-26 MED ORDER — HYDROCODONE-ACETAMINOPHEN 5-325 MG PO TABS: 1.0000 | ORAL_TABLET | Freq: Four times a day (QID) | ORAL | 0 refills | Status: DC | PRN

## 2022-10-26 SURGICAL SUPPLY — 71 items
ACC NRSTM 4 TRQ WRNCH STRL (MISCELLANEOUS)
ADH SKN CLS APL DERMABOND .7 (GAUZE/BANDAGES/DRESSINGS) ×2
ATTRACTOMAT 16X20 MAGNETIC DRP (DRAPES) IMPLANT
BLADE CLIPPER SURG (BLADE) IMPLANT
BLADE SURG 15 STRL LF DISP TIS (BLADE) ×1 IMPLANT
BLADE SURG 15 STRL SS (BLADE) ×1
CANISTER SUCT 1200ML W/VALVE (MISCELLANEOUS) ×1 IMPLANT
CORD BIPOLAR FORCEPS 12FT (ELECTRODE) ×1 IMPLANT
COVER PROBE CYLINDRICAL 5X96 (MISCELLANEOUS) ×1 IMPLANT
DERMABOND ADVANCED .7 DNX12 (GAUZE/BANDAGES/DRESSINGS) ×2 IMPLANT
DRAPE C-ARM 35X43 STRL (DRAPES) ×1 IMPLANT
DRAPE HEAD BAR (DRAPES) IMPLANT
DRAPE INCISE IOBAN 66X45 STRL (DRAPES) ×1 IMPLANT
DRAPE MICROSCOPE WILD 40.5X102 (DRAPES) ×1 IMPLANT
DRAPE UTILITY XL STRL (DRAPES) ×1 IMPLANT
DRSG TEGADERM 2-3/8X2-3/4 SM (GAUZE/BANDAGES/DRESSINGS) ×2 IMPLANT
DRSG TEGADERM 4X4.75 (GAUZE/BANDAGES/DRESSINGS) IMPLANT
ELECT COATED BLADE 2.86 ST (ELECTRODE) ×1 IMPLANT
ELECT EMG 18 NIMS (NEUROSURGERY SUPPLIES) ×1
ELECT REM PT RETURN 9FT ADLT (ELECTROSURGICAL) ×1
ELECTRODE EMG 18 NIMS (NEUROSURGERY SUPPLIES) ×1 IMPLANT
ELECTRODE REM PT RTRN 9FT ADLT (ELECTROSURGICAL) ×1 IMPLANT
FORCEPS BIPOLAR SPETZLER 8 1.0 (NEUROSURGERY SUPPLIES) ×1 IMPLANT
GAUZE 4X4 16PLY ~~LOC~~+RFID DBL (SPONGE) ×1 IMPLANT
GAUZE SPONGE 4X4 12PLY STRL (GAUZE/BANDAGES/DRESSINGS) ×1 IMPLANT
GENERATOR PULSE INSPIRE (Generator) ×1 IMPLANT
GENERATOR PULSE INSPIRE IV (Generator) ×1 IMPLANT
GLOVE BIO SURGEON STRL SZ 6.5 (GLOVE) IMPLANT
GLOVE BIO SURGEON STRL SZ7.5 (GLOVE) ×1 IMPLANT
GLOVE BIOGEL PI IND STRL 7.0 (GLOVE) IMPLANT
GLOVE SURG SS PI 7.0 STRL IVOR (GLOVE) IMPLANT
GOWN STRL REUS W/ TWL LRG LVL3 (GOWN DISPOSABLE) ×3 IMPLANT
GOWN STRL REUS W/TWL LRG LVL3 (GOWN DISPOSABLE) ×3
IV CATH 18G SAFETY (IV SOLUTION) ×1 IMPLANT
KIT NEURO ACCESSORY W/WRENCH (MISCELLANEOUS) IMPLANT
LEAD SENSING RESP INSPIRE (Lead) ×1 IMPLANT
LEAD SENSING RESP INSPIRE IV (Lead) ×1 IMPLANT
LEAD SLEEP STIM INSPIRE IV/V (Lead) ×1 IMPLANT
LEAD SLEEP STIMULATION INSPIRE (Lead) ×1 IMPLANT
LOOP VASCLR MAXI BLUE 18IN ST (MISCELLANEOUS) ×1 IMPLANT
LOOP VASCULAR MAXI 18 BLUE (MISCELLANEOUS) ×1
LOOP VASCULAR MINI 18 RED (MISCELLANEOUS) ×1
LOOPS VASCLR MAXI BLUE 18IN ST (MISCELLANEOUS) ×1 IMPLANT
MARKER SKIN DUAL TIP RULER LAB (MISCELLANEOUS) ×1 IMPLANT
NDL HYPO 25X1 1.5 SAFETY (NEEDLE) ×1 IMPLANT
NEEDLE HYPO 25X1 1.5 SAFETY (NEEDLE) ×1 IMPLANT
NS IRRIG 1000ML POUR BTL (IV SOLUTION) ×1 IMPLANT
PACK BASIN DAY SURGERY FS (CUSTOM PROCEDURE TRAY) ×1 IMPLANT
PACK ENT DAY SURGERY (CUSTOM PROCEDURE TRAY) ×1 IMPLANT
PASSER CATH 36 CODMAN DISP (NEUROSURGERY SUPPLIES) IMPLANT
PASSER CATH 38CM DISP (INSTRUMENTS) IMPLANT
PENCIL SMOKE EVACUATOR (MISCELLANEOUS) ×1 IMPLANT
PROBE NERVE STIMULATOR (NEUROSURGERY SUPPLIES) ×1 IMPLANT
REMOTE CONTROL SLEEP INSPIRE (MISCELLANEOUS) ×1 IMPLANT
SET WALTER ACTIVATION W/DRAPE (SET/KITS/TRAYS/PACK) ×1 IMPLANT
SLEEVE SCD COMPRESS KNEE MED (STOCKING) ×1 IMPLANT
SPONGE INTESTINAL PEANUT (DISPOSABLE) ×1 IMPLANT
SUT SILK 2 0 SH (SUTURE) ×1 IMPLANT
SUT SILK 3 0 REEL (SUTURE) ×1 IMPLANT
SUT SILK 3 0 SH 30 (SUTURE) ×1 IMPLANT
SUT SILK 3-0 (SUTURE) ×1
SUT SILK 3-0 RB1 30XBRD (SUTURE) ×1
SUT VIC AB 3-0 SH 27 (SUTURE) ×2
SUT VIC AB 3-0 SH 27X BRD (SUTURE) ×2 IMPLANT
SUT VIC AB 4-0 PS2 27 (SUTURE) ×2 IMPLANT
SUTURE SILK 3-0 RB1 30XBRD (SUTURE) ×1 IMPLANT
SYR 10ML LL (SYRINGE) ×1 IMPLANT
SYR BULB EAR ULCER 3OZ GRN STR (SYRINGE) ×1 IMPLANT
TOWEL GREEN STERILE FF (TOWEL DISPOSABLE) ×2 IMPLANT
VASCULAR TIE MAXI BLUE 18IN ST (MISCELLANEOUS) ×1
VASCULAR TIE MINI RED 18IN STL (MISCELLANEOUS) ×1 IMPLANT

## 2022-10-26 NOTE — Discharge Instructions (Addendum)
  Post Anesthesia Home Care Instructions  Activity: Get plenty of rest for the remainder of the day. A responsible individual must stay with you for 24 hours following the procedure.  For the next 24 hours, DO NOT: -Drive a car -Advertising copywriter -Drink alcoholic beverages -Take any medication unless instructed by your physician -Make any legal decisions or sign important papers.  Meals: Start with liquid foods such as gelatin or soup. Progress to regular foods as tolerated. Avoid greasy, spicy, heavy foods. If nausea and/or vomiting occur, drink only clear liquids until the nausea and/or vomiting subsides. Call your physician if vomiting continues.  Special Instructions/Symptoms: Your throat may feel dry or sore from the anesthesia or the breathing tube placed in your throat during surgery. If this causes discomfort, gargle with warm salt water. The discomfort should disappear within 24 hours.  If you had a scopolamine patch placed behind your ear for the management of post- operative nausea and/or vomiting:  1. The medication in the patch is effective for 72 hours, after which it should be removed.  Wrap patch in a tissue and discard in the trash. Wash hands thoroughly with soap and water. 2. You may remove the patch earlier than 72 hours if you experience unpleasant side effects which may include dry mouth, dizziness or visual disturbances. 3. Avoid touching the patch. Wash your hands with soap and water after contact with the patch.  No tylenol until after 8:09pm tonight if needed.

## 2022-10-26 NOTE — Anesthesia Preprocedure Evaluation (Signed)
Anesthesia Evaluation  Patient identified by MRN, date of birth, ID band Patient awake    Reviewed: Allergy & Precautions, NPO status , Patient's Chart, lab work & pertinent test results  Airway Mallampati: III  TM Distance: >3 FB Neck ROM: Full    Dental no notable dental hx. (+) Upper Dentures, Lower Dentures, Dental Advisory Given   Pulmonary shortness of breath and with exertion, asthma , sleep apnea , COPD,  COPD inhaler, former smoker Intolerant of CPAP   Pulmonary exam normal breath sounds clear to auscultation       Cardiovascular hypertension, Pt. on medications + CAD and +CHF  Normal cardiovascular exam Rhythm:Regular Rate:Normal     Neuro/Psych  Headaches PSYCHIATRIC DISORDERS Anxiety Depression     Neuromuscular disease    GI/Hepatic ,GERD  Medicated,,  Endo/Other  diabetes, Well Controlled, Type 2, Oral Hypoglycemic Agents  Obesity Hyperlipidemia GLP-1 RA therapy - last dose more than 7 days ago  Renal/GU Renal disease  negative genitourinary   Musculoskeletal  (+)  Fibromyalgia -  Abdominal  (+) + obese  Peds  Hematology negative hematology ROS (+)   Anesthesia Other Findings   Reproductive/Obstetrics                             Anesthesia Physical Anesthesia Plan  ASA: 3  Anesthesia Plan: General   Post-op Pain Management: Celebrex PO (pre-op)* and Tylenol PO (pre-op)*   Induction: Intravenous  PONV Risk Score and Plan: 2 and Treatment may vary due to age or medical condition, Ondansetron and Dexamethasone  Airway Management Planned: Oral ETT  Additional Equipment: None  Intra-op Plan:   Post-operative Plan: Extubation in OR  Informed Consent: I have reviewed the patients History and Physical, chart, labs and discussed the procedure including the risks, benefits and alternatives for the proposed anesthesia with the patient or authorized representative who has  indicated his/her understanding and acceptance.     Dental advisory given  Plan Discussed with: CRNA and Anesthesiologist  Anesthesia Plan Comments: (  )        Anesthesia Quick Evaluation

## 2022-10-26 NOTE — Transfer of Care (Signed)
Immediate Anesthesia Transfer of Care Note  Patient: Kenneth Fuller  Procedure(s) Performed: IMPLANTATION OF HYPOGLOSSAL NERVE STIMULATOR (Right: Neck)  Patient Location: PACU  Anesthesia Type:General  Level of Consciousness: drowsy and patient cooperative  Airway & Oxygen Therapy: Patient Spontanous Breathing and Patient connected to face mask oxygen  Post-op Assessment: Report given to RN and Post -op Vital signs reviewed and stable  Post vital signs: Reviewed and stable  Last Vitals:  Vitals Value Taken Time  BP 146/79 (99)   Temp    Pulse 88 10/26/22 1511  Resp    SpO2 94 % 10/26/22 1511  Vitals shown include unfiled device data.  Last Pain:  Vitals:   10/26/22 1041  TempSrc: Temporal  PainSc: 0-No pain      Patients Stated Pain Goal: 1 (10/26/22 1041)  Complications: No notable events documented.

## 2022-10-26 NOTE — Op Note (Signed)

## 2022-10-26 NOTE — H&P (Signed)
Kenneth Fuller is an 77 y.o. male.   Chief Complaint: Sleep apnea HPI: 77 year old male with sleep apnea who has been unable to tolerate CPAP.  Past Medical History:  Diagnosis Date   Allergy    Anxiety    Asthma    CAD (coronary artery disease)    CHF (congestive heart failure) (HCC)    COPD (chronic obstructive pulmonary disease) (HCC)    Depression    Diabetes mellitus    Fibromyalgia    GERD (gastroesophageal reflux disease)    GI bleeding    Gout    Hyperlipidemia    Hypertension    Hypogonadism male    Insomnia    MRSA cellulitis    Neuropathy    Obesity    Shortness of breath dyspnea    with exertion    Sleep apnea    does not use CPAP   Wheezing    no asthma diagnosis    Past Surgical History:  Procedure Laterality Date   BACK SURGERY     BIOPSY  12/20/2019   Procedure: BIOPSY;  Surgeon: Rachael Fee, MD;  Location: [facility] ENDOSCOPY;  Service: Endoscopy;;   CARDIAC CATHETERIZATION     COLONOSCOPY     COLONOSCOPY WITH PROPOFOL N/A 12/20/2019   Procedure: COLONOSCOPY WITH PROPOFOL;  Surgeon: Rachael Fee, MD;  Location: [facility] ENDOSCOPY;  Service: Endoscopy;  Laterality: N/A;   DRUG INDUCED ENDOSCOPY N/A 07/21/2022   Procedure: DRUG INDUCED ENDOSCOPY;  Surgeon: Oretha Milch, MD;  Location: [facility] ENDOSCOPY;  Service: Pulmonary;  Laterality: N/A;   ESOPHAGOGASTRODUODENOSCOPY (EGD) WITH PROPOFOL N/A 12/20/2019   Procedure: ESOPHAGOGASTRODUODENOSCOPY (EGD) WITH PROPOFOL;  Surgeon: Rachael Fee, MD;  Location: [facility] ENDOSCOPY;  Service: Endoscopy;  Laterality: N/A;   LEFT HEART CATH AND CORONARY ANGIOGRAPHY N/A 12/02/2016   Procedure: LEFT HEART CATH AND CORONARY ANGIOGRAPHY;  Surgeon: Corky Crafts, MD;  Location: [facility] INVASIVE CV LAB;  Service: Cardiovascular;  Laterality: N/A;   LUMBAR LAMINECTOMY/DECOMPRESSION MICRODISCECTOMY N/A 01/02/2014   Procedure: CENTRAL DECOMPRESSION LUMBAR LAMINECTOMY L3-L4, L4-L5;  Surgeon: Jacki Cones, MD;  Location: [facility]  ORS;  Service: Orthopedics;  Laterality: N/A;   neck fusion     POLYPECTOMY  12/20/2019   Procedure: POLYPECTOMY;  Surgeon: Rachael Fee, MD;  Location: [facility] ENDOSCOPY;  Service: Endoscopy;;    Family History  Problem Relation Age of Onset   Colon cancer Mother    Diabetes Father        siblings   Heart disease Father        brother   Heart attack Father    Kidney disease Sister    Heart failure Sister    Drug abuse Sister    Heart disease Brother    Heart disease Brother    Deep vein thrombosis Brother    Heart attack Son 10   Colon polyps Neg Hx    Migraines Neg Hx    Social History:  reports that he quit smoking about 53 years ago. His smoking use included cigarettes. He started smoking about 58 years ago. He has a 5 pack-year smoking history. He quit smokeless tobacco use about 53 years ago. He reports that he does not drink alcohol and does not use drugs.  Allergies:  Allergies  Allergen Reactions   Amlodipine Besy-Benazepril Hcl Swelling and Other (See Comments)    Makes tongue swell (lotrel)   Hydrocodone Itching    Can tolerate in low doses   Oxycodone Itching  Phenergan [Promethazine Hcl] Other (See Comments)    "I can't remember."    Medications Prior to Admission  Medication Sig Dispense Refill   atorvastatin (LIPITOR) 40 MG tablet Take 40 mg by mouth daily.     bisoprolol (ZEBETA) 5 MG tablet Take 1/2 (one-half) tablet by mouth once daily 45 tablet 3   cetirizine (ZYRTEC) 10 MG tablet Take 1 tablet by mouth once daily 90 tablet 3   Cholecalciferol (VITAMIN D3) 125 MCG (5000 UT) TABS Take 5,000 Units by mouth daily.     dapagliflozin propanediol (FARXIGA) 10 MG TABS tablet Take 1 tablet (10 mg total) by mouth daily. 90 tablet 11   GEMTESA 75 MG TABS Take 75 mg by mouth daily.     hydrALAZINE (APRESOLINE) 50 MG tablet Take 1.5 tablets (75 mg total) by mouth 3 (three) times daily.     HYDROcodone-acetaminophen (NORCO/VICODIN) 5-325 MG tablet Take 1-2 tablets  by mouth every 4 (four) hours as needed for severe pain.     omeprazole (PRILOSEC) 40 MG capsule TAKE 1 CAPSULE BY MOUTH ONCE DAILY 30-60 MINUTES  BEFORE BREAKFAST 90 capsule 0   OVER THE COUNTER MEDICATION Take 3 capsules by mouth daily. Super Beets     roflumilast (DALIRESP) 500 MCG TABS tablet Take 1 tablet (500 mcg total) by mouth daily. 90 tablet 3   Semaglutide,0.25 or 0.5MG /DOS, (OZEMPIC, 0.25 OR 0.5 MG/DOSE,) 2 MG/3ML SOPN Inject 0.5 mg into the skin once a week.     sertraline (ZOLOFT) 100 MG tablet Take 1 tablet (100 mg total) by mouth daily. 90 tablet 3   tamsulosin (FLOMAX) 0.4 MG CAPS capsule Take 1 capsule (0.4 mg total) by mouth daily after supper. 90 capsule 3   albuterol (PROVENTIL) (2.5 MG/3ML) 0.083% nebulizer solution USE 1 VIAL IN NEBULIZER EVERY 6 HOURS AS NEEDED FOR WHEEZING AND FOR SHORTNESS OF BREATH 180 mL 0   albuterol (VENTOLIN HFA) 108 (90 Base) MCG/ACT inhaler Inhale 2 puffs into the lungs every 6 (six) hours as needed for wheezing or shortness of breath. 1 each 2   Blood Glucose Monitoring Suppl (ONETOUCH VERIO FLEX SYSTEM) w/Device KIT Use to test blood sugar daily as directed. DX: E11.65 1 kit 0   fluticasone (FLONASE) 50 MCG/ACT nasal spray Place 2 sprays into both nostrils daily. (Patient taking differently: Place 2 sprays into both nostrils daily as needed for allergies.) 16 g 6   furosemide (LASIX) 40 MG tablet Take 40 mg by mouth daily as needed (fluid retention.). (Patient not taking: Reported on 09/20/2022)     glucose blood (ONETOUCH VERIO) test strip test blood sugar daily as directed. DX: E11.65 100 each 3   isosorbide dinitrate (ISORDIL) 20 MG tablet Take 1 tablet (20 mg total) by mouth 3 (three) times daily. 270 tablet 3   Lancets (ONETOUCH DELICA PLUS LANCET33G) MISC USE 1 LANCET TO CHECK GLUCOSE ONCE DAILY AS DIRECTED 100 each 8   nitroGLYCERIN (NITROSTAT) 0.4 MG SL tablet Place 0.4 mg under the tongue every 5 (five) minutes as needed for chest pain.       Results for orders placed or performed during the hospital encounter of 10/26/22 (from the past 48 hour(s))  Glucose, capillary     Status: None   Collection Time: 10/26/22 10:39 AM  Result Value Ref Range   Glucose-Capillary 88 70 - 99 mg/dL    Comment: Glucose reference range applies only to samples taken after fasting for at least 8 hours.   *Note: Due to a  large number of results and/or encounters for the requested time period, some results have not been displayed. A complete set of results can be found in Results Review.   No results found.  Review of Systems  All other systems reviewed and are negative.   Blood pressure (!) 155/74, pulse 70, temperature (!) 97.3 F (36.3 C), temperature source Temporal, resp. rate 18, height 5\' 7"  (1.702 m), weight 101.8 kg, SpO2 96%. Physical Exam Constitutional:      Appearance: Normal appearance. He is normal weight.  HENT:     Head: Normocephalic and atraumatic.     Right Ear: External ear normal.     Left Ear: External ear normal.     Nose: Nose normal.     Mouth/Throat:     Mouth: Mucous membranes are moist.     Pharynx: Oropharynx is clear.  Eyes:     Extraocular Movements: Extraocular movements intact.     Conjunctiva/sclera: Conjunctivae normal.     Pupils: Pupils are equal, round, and reactive to light.  Cardiovascular:     Rate and Rhythm: Normal rate.  Pulmonary:     Effort: Pulmonary effort is normal.  Musculoskeletal:     Cervical back: Normal range of motion.  Skin:    General: Skin is warm and dry.  Neurological:     General: No focal deficit present.     Mental Status: He is alert and oriented to person, place, and time.  Psychiatric:        Mood and Affect: Mood normal.        Behavior: Behavior normal.        Thought Content: Thought content normal.        Judgment: Judgment normal.      Assessment/Plan Obstructive sleep apnea and BMI 35.15  To OR for hypoglossal nerve stimulator  placement.  Christia Reading, MD 10/26/2022, 12:39 PM

## 2022-10-26 NOTE — Brief Op Note (Signed)
10/26/2022  2:49 PM  PATIENT:  Kenneth Fuller  77 y.o. male  PRE-OPERATIVE DIAGNOSIS:  BMI 35.0-35.9,adult Obstructive sleep apnea  POST-OPERATIVE DIAGNOSIS:  BMI 35.0-35.9,adult Obstructive sleep apnea  PROCEDURE:  Procedure(s): IMPLANTATION OF HYPOGLOSSAL NERVE STIMULATOR (Right)  SURGEON:  Surgeons and Role:    Christia Reading, MD - Primary  PHYSICIAN ASSISTANT:   ASSISTANTS: none   ANESTHESIA:   general  EBL:  Minimal   BLOOD ADMINISTERED:none  DRAINS: none   LOCAL MEDICATIONS USED:  LIDOCAINE   SPECIMEN:  No Specimen  DISPOSITION OF SPECIMEN:  N/A  COUNTS:  YES  TOURNIQUET:  * No tourniquets in log *  DICTATION: .Note written in EPIC  PLAN OF CARE: Discharge to home after PACU  PATIENT DISPOSITION:  PACU - hemodynamically stable.   Delay start of Pharmacological VTE agent (>24hrs) due to surgical blood loss or risk of bleeding: no

## 2022-10-26 NOTE — Telephone Encounter (Signed)
I left a message for the patient to call our office to schedule a tele visit for pre-co clearance.

## 2022-10-26 NOTE — Anesthesia Postprocedure Evaluation (Signed)
Anesthesia Post Note  Patient: Kenneth Fuller  Procedure(s) Performed: IMPLANTATION OF HYPOGLOSSAL NERVE STIMULATOR (Right: Neck)     Patient location during evaluation: PACU Anesthesia Type: General Level of consciousness: awake and alert Pain management: pain level controlled Vital Signs Assessment: post-procedure vital signs reviewed and stable Respiratory status: spontaneous breathing, nonlabored ventilation, respiratory function stable and patient connected to nasal cannula oxygen Cardiovascular status: blood pressure returned to baseline and stable Postop Assessment: no apparent nausea or vomiting Anesthetic complications: no   No notable events documented.  Last Vitals:  Vitals:   10/26/22 1630 10/26/22 1645  BP:  (!) 158/91  Pulse: 76 75  Resp: 11 18  Temp:  (!) 36.2 C  SpO2: 91% 94%    Last Pain:  Vitals:   10/26/22 1645  TempSrc:   PainSc: 3                  Kenneth Fuller P Kenneth Fuller

## 2022-10-26 NOTE — Anesthesia Procedure Notes (Signed)
Procedure Name: Intubation Date/Time: 10/26/2022 1:18 PM  Performed by: Aundria Rud, CRNAPre-anesthesia Checklist: Patient identified, Emergency Drugs available, Suction available and Patient being monitored Patient Re-evaluated:Patient Re-evaluated prior to induction Oxygen Delivery Method: Circle System Utilized Preoxygenation: Pre-oxygenation with 100% oxygen Induction Type: IV induction Ventilation: Mask ventilation without difficulty and Oral airway inserted - appropriate to patient size Laryngoscope Size: Mac and 4 Grade View: Grade I Tube type: Oral Tube size: 7.5 mm Number of attempts: 1 Airway Equipment and Method: Stylet and Oral airway Placement Confirmation: ETT inserted through vocal cords under direct vision, positive ETCO2 and breath sounds checked- equal and bilateral Secured at: 22 cm Tube secured with: Tape Dental Injury: Teeth and Oropharynx as per pre-operative assessment

## 2022-10-26 NOTE — Telephone Encounter (Signed)
Name: FRANCISCUS ZISK  DOB: 01-Apr-1945  MRN: [id number]  Primary Cardiologist: Thurmon Fair, MD  Chart reviewed as part of pre-operative protocol coverage. Because of Corvell Sohmer Duggar's past medical history and time since last visit, he will require a follow-up telephone visit in order to better assess preoperative cardiovascular risk.  Pre-op covering staff: - Please schedule appointment and call patient to inform them. If patient already had an upcoming appointment within acceptable timeframe, please add "pre-op clearance" to the appointment notes so provider is aware. - Please contact requesting surgeon's office via preferred method (i.e, phone, fax) to inform them of need for appointment prior to surgery.  No medications indicated as needing held.  Sharlene Dory, PA-C  10/26/2022, 2:18 PM

## 2022-10-26 NOTE — Telephone Encounter (Signed)
Pre-operative Risk Assessment    Patient Name: Kenneth Fuller  DOB: 03-21-1945 MRN: [id number]   LAST OFFICE VISIT: 08/18/22 WITH DR. Royann Shivers  NEXT VISIT:  TO BE SEEN IN 1 YEAR - NO SCHEDULED APPOINTMENT   Request for Surgical Clearance    Procedure:   XLI F L3-4 WITH PSFI/LUMBAR FUSION   Date of Surgery:  Clearance TBD                                 Surgeon:  DR. [facility] BROOKS  Surgeon's Group or Practice Name:  [facility] Phone number:  [phone] Fax number:  [phone] SHERRY WILLIS   Type of Clearance Requested:   - Medical    Type of Anesthesia:  Not Indicated   Additional requests/questions:    SignedMichaelle Copas   10/26/2022, 12:10 PM

## 2022-10-27 ENCOUNTER — Telehealth: Payer: Self-pay | Admitting: *Deleted

## 2022-10-27 ENCOUNTER — Encounter (HOSPITAL_BASED_OUTPATIENT_CLINIC_OR_DEPARTMENT_OTHER): Payer: Self-pay | Admitting: Otolaryngology

## 2022-10-27 NOTE — Telephone Encounter (Signed)
  Patient Consent for Virtual Visit    Kenneth Fuller has provided verbal consent on 10/27/2022 for a virtual visit (video or telephone).   CONSENT FOR VIRTUAL VISIT FOR:  Kenneth Fuller  By participating in this virtual visit I agree to the following:  I hereby voluntarily request, consent and authorize [facility] and its employed or contracted physicians, physician assistants, nurse practitioners or other licensed health care professionals (the Practitioner), to provide me with telemedicine health care services (the "Services") as deemed necessary by the treating Practitioner. I acknowledge and consent to receive the Services by the Practitioner via telemedicine. I understand that the telemedicine visit will involve communicating with the Practitioner through live audiovisual communication technology and the disclosure of certain medical information by electronic transmission. I acknowledge that I have been given the opportunity to request an in-person assessment or other available alternative prior to the telemedicine visit and am voluntarily participating in the telemedicine visit.  I understand that I have the right to withhold or withdraw my consent to the use of telemedicine in the course of my care at any time, without affecting my right to future care or treatment, and that the Practitioner or I may terminate the telemedicine visit at any time. I understand that I have the right to inspect all information obtained and/or recorded in the course of the telemedicine visit and may receive copies of available information for a reasonable fee.  I understand that some of the potential risks of receiving the Services via telemedicine include:  Delay or interruption in medical evaluation due to technological equipment failure or disruption; Information transmitted may not be sufficient (e.g. poor resolution of images) to allow for appropriate medical decision making by the  Practitioner; and/or  In rare instances, security protocols could fail, causing a breach of personal health information.  Furthermore, I acknowledge that it is my responsibility to provide information about my medical history, conditions and care that is complete and accurate to the best of my ability. I acknowledge that Practitioner's advice, recommendations, and/or decision may be based on factors not within their control, such as incomplete or inaccurate data provided by me or distortions of diagnostic images or specimens that may result from electronic transmissions. I understand that the practice of medicine is not an exact science and that Practitioner makes no warranties or guarantees regarding treatment outcomes. I acknowledge that a copy of this consent can be made available to me via my patient portal [facility] MyChart), or I can request a printed copy by calling the office of [facility].    I understand that my insurance will be billed for this visit.   I have read or had this consent read to me. I understand the contents of this consent, which adequately explains the benefits and risks of the Services being provided via telemedicine.  I have been provided ample opportunity to ask questions regarding this consent and the Services and have had my questions answered to my satisfaction. I give my informed consent for the services to be provided through the use of telemedicine in my medical care

## 2022-10-27 NOTE — Telephone Encounter (Signed)
Patient scheduled.

## 2022-11-02 DIAGNOSIS — G4733 Obstructive sleep apnea (adult) (pediatric): Secondary | ICD-10-CM | POA: Diagnosis not present

## 2022-11-02 DIAGNOSIS — Z9682 Presence of neurostimulator: Secondary | ICD-10-CM | POA: Diagnosis not present

## 2022-11-02 DIAGNOSIS — K148 Other diseases of tongue: Secondary | ICD-10-CM | POA: Diagnosis not present

## 2022-11-10 ENCOUNTER — Encounter: Payer: PPO | Admitting: Family Medicine

## 2022-11-10 NOTE — Progress Notes (Unsigned)
Virtual Visit via Telephone Note   Because of Kenneth Fuller's co-morbid illnesses, he is at least at moderate risk for complications without adequate follow up.  This format is felt to be most appropriate for this patient at this time.  The patient did not have access to video technology/had technical difficulties with video requiring transitioning to audio format only (telephone).  All issues noted in this document were discussed and addressed.  No physical exam could be performed with this format.  Please refer to the patient's chart for his consent to telehealth for [facility].  Evaluation Performed:  Preoperative cardiovascular risk assessment _____________   Date:  11/11/2022   Patient ID:  Kenneth Fuller, DOB Sep 28, 1945, MRN [id number] Patient Location:  Home Provider location:   Office  Primary Care Provider:  [name], Elige Radon, MD Primary Cardiologist:  Thurmon Fair, MD  Chief Complaint / Patient Profile   77 y.o. y/o male with a h/o chronic diastolic heart failure, morbid obesity, obstructive sleep apnea, hyperlipidemia, diabetes mellitus complicated by neuropathy, symptomatic orthostatic hypotension, minor nonobstructive coronary atherosclerosis, moderately severe restrictive lung disease. He has a history of angioedema with ACE inhibitors.   He is pending XLI F L3-4 WITH PSFI/LUMBAR FUSION by Dr. Debria Garret, Shon Baton on date to be determined, and presents today for telephonic preoperative cardiovascular risk assessment.  History of Present Illness    Kenneth Fuller is a 77 y.o. male who presents via audio/video conferencing for a telehealth visit today.  Pt was last seen in cardiology clinic on 08/18/2022 by Dr.Croitoru.  At that time Kenneth Fuller was doing well had lost 60 lbs on Farxiga and Ozempic.  The patient is now pending procedure as outlined above. Since his last visit, he has been stable from a cardiac standpoint. He denies chest pain, DOE  or palpitations. He is limited by back pain concerning activity level.   Past Medical History    Past Medical History:  Diagnosis Date   Allergy    Anxiety    Asthma    CAD (coronary artery disease)    CHF (congestive heart failure) (HCC)    COPD (chronic obstructive pulmonary disease) (HCC)    Depression    Diabetes mellitus    Fibromyalgia    GERD (gastroesophageal reflux disease)    GI bleeding    Gout    Hyperlipidemia    Hypertension    Hypogonadism male    Insomnia    MRSA cellulitis    Neuropathy    Obesity    Shortness of breath dyspnea    with exertion    Sleep apnea    does not use CPAP   Wheezing    no asthma diagnosis   Past Surgical History:  Procedure Laterality Date   BACK SURGERY     BIOPSY  12/20/2019   Procedure: BIOPSY;  Surgeon: Rachael Fee, MD;  Location: [facility];  Service: Endoscopy;;   CARDIAC CATHETERIZATION     COLONOSCOPY     COLONOSCOPY WITH PROPOFOL N/A 12/20/2019   Procedure: COLONOSCOPY WITH PROPOFOL;  Surgeon: Rachael Fee, MD;  Location: [facility];  Service: Endoscopy;  Laterality: N/A;   DRUG INDUCED ENDOSCOPY N/A 07/21/2022   Procedure: DRUG INDUCED ENDOSCOPY;  Surgeon: Oretha Milch, MD;  Location[facility] END[facility]COPY;  Service: Pulmonary;  Laterality: N/A;   ESOPHAGOGASTRODUODENOSCOPY (EGD) WITH PROPOFOL N/A 12/20/2019   Procedure: ESOPHAGOGASTRODUODENOSCOPY (EGD) WITH PROPOFOL;  Surgeon: Rachael Fee, MD;  Location: [facility];  Service:  Endoscopy;  Laterality: N/A;   IMPLANTATION OF HYPOGLOSSAL NERVE STIMULATOR Right 10/26/2022   Procedure: IMPLANTATION OF HYPOGLOSSAL NERVE STIMULATOR;  Surgeon: Christia Reading, MD;  Location: [facility];  Service: ENT;  Laterality: Right;   LEFT HEART CATH AND CORONARY ANGIOGRAPHY N/A 12/02/2016   Procedure: LEFT HEART CATH AND CORONARY ANGIOGRAPHY;  Surgeon: Corky Crafts, MD;  Location: [facility] INVASIVE CV LAB;  Service: Cardiovascular;  Laterality: N/A;   LUMBAR  LAMINECTOMY/DECOMPRESSION MICRODISCECTOMY N/A 01/02/2014   Procedure: CENTRAL DECOMPRESSION LUMBAR LAMINECTOMY L3-L4, L4-L5;  Surgeon: Jacki Cones, MD;  Location: [facility];  Service: Orthopedics;  Laterality: N/A;   neck fusion     POLYPECTOMY  12/20/2019   Procedure: POLYPECTOMY;  Surgeon: Rachael Fee, MD;  Location: [facility];  Service: Endoscopy;;    Allergies  Allergies  Allergen Reactions   Amlodipine Besy-Benazepril Hcl Swelling and Other (See Comments)    Makes tongue swell (lotrel)   Hydrocodone Itching    Can tolerate in low doses   Oxycodone Itching   Phenergan [Promethazine Hcl] Other (See Comments)    "I can't remember."    Home Medications    Prior to Admission medications   Medication Sig Start Date End Date Taking? Authorizing Provider  albuterol (PROVENTIL) (2.5 MG/3ML) 0.083% nebulizer solution USE 1 VIAL IN NEBULIZER EVERY 6 HOURS AS NEEDED FOR WHEEZING AND FOR SHORTNESS OF BREATH 03/10/21   [name], Elige Radon, MD  albuterol (VENTOLIN HFA) 108 (90 Base) MCG/ACT inhaler Inhale 2 puffs into the lungs every 6 (six) hours as needed for wheezing or shortness of breath. 05/05/20   Mechele Claude, MD  atorvastatin (LIPITOR) 40 MG tablet Take 40 mg by mouth daily.    [provider]  bisoprolol (ZEBETA) 5 MG tablet Take 1/2 (one-half) tablet by mouth once daily 09/14/21   [name], Elige Radon, MD  Blood Glucose Monitoring Suppl (ONETOUCH VERIO FLEX SYSTEM) w/Device KIT Use to test blood sugar daily as directed. DX: E11.65 08/14/21   [name], Elige Radon, MD  cetirizine (ZYRTEC) 10 MG tablet Take 1 tablet by mouth once daily 12/04/21   [name], Elige Radon, MD  Cholecalciferol (VITAMIN D3) 125 MCG (5000 UT) TABS Take 5,000 Units by mouth daily.    [provider]  dapagliflozin propanediol (FARXIGA) 10 MG TABS tablet Take 1 tablet (10 mg total) by mouth daily. 12/29/21   [name], Elige Radon, MD  fluticasone (FLONASE) 50 MCG/ACT nasal spray Place 2 sprays  into both nostrils daily. Patient taking differently: Place 2 sprays into both nostrils daily as needed for allergies. 11/20/20   Junie Spencer, FNP  furosemide (LASIX) 40 MG tablet Take 40 mg by mouth daily as needed (fluid retention.). Patient not taking: Reported on 09/20/2022    [provider]  GEMTESA 75 MG TABS Take 75 mg by mouth daily.    [provider]  glucose blood (ONETOUCH VERIO) test strip test blood sugar daily as directed. DX: E11.65 09/13/22   [name], Elige Radon, MD  hydrALAZINE (APRESOLINE) 50 MG tablet Take 1.5 tablets (75 mg total) by mouth 3 (three) times daily. 08/30/22   Croitoru, Mihai, MD  HYDROcodone-acetaminophen (NORCO/VICODIN) 5-325 MG tablet Take 1-2 tablets by mouth every 4 (four) hours as needed for severe pain. 04/08/22   [provider]  HYDROcodone-acetaminophen (NORCO/VICODIN) 5-325 MG tablet Take 1-2 tablets by mouth every 6 (six) hours as needed for moderate pain. 10/26/22 10/26/23  Christia Reading, MD  isosorbide dinitrate (ISORDIL) 20 MG tablet Take 1  tablet (20 mg total) by mouth 3 (three) times daily. 09/20/22   [name], Elige Radon, MD  Lancets [facility] DELICA PLUS LANCET33G) MISC USE 1 LANCET TO CHECK GLUCOSE ONCE DAILY AS DIRECTED 09/13/22   [name], Elige Radon, MD  nitroGLYCERIN (NITROSTAT) 0.4 MG SL tablet Place 0.4 mg under the tongue every 5 (five) minutes as needed for chest pain.    [provider]  omeprazole (PRILOSEC) 40 MG capsule TAKE 1 CAPSULE BY MOUTH ONCE DAILY 30-60 MINUTES  BEFORE BREAKFAST 09/27/22   Imogene Burn, MD  OVER THE COUNTER MEDICATION Take 3 capsules by mouth daily. Super Beets    [provider]  roflumilast (DALIRESP) 500 MCG TABS tablet Take 1 tablet (500 mcg total) by mouth daily. 05/31/22   [name], Elige Radon, MD  Semaglutide,0.25 or 0.5MG /DOS, (OZEMPIC, 0.25 OR 0.5 MG/DOSE,) 2 MG/3ML SOPN Inject 0.5 mg into the skin once a week.    [provider]  sertraline (ZOLOFT) 100 MG  tablet Take 1 tablet (100 mg total) by mouth daily. 05/31/22   [name], Elige Radon, MD  tamsulosin (FLOMAX) 0.4 MG CAPS capsule Take 1 capsule (0.4 mg total) by mouth daily after supper. 09/20/22   [name], Elige Radon, MD    Physical Exam    Vital Signs:  Barbette Hair Squyres does not have vital signs available for review today.  Given telephonic nature of communication, physical exam is limited. AAOx3. NAD. Normal affect.  Speech and respirations are unlabored.  Accessory Clinical Findings    None  Assessment & Plan    1.  Preoperative Cardiovascular Risk Assessment:  According to the Revised Cardiac Risk Index (RCRI), his Perioperative Risk of Major Cardiac Event is (%): 6.6  His Functional Capacity in METs is: 7.25 according to the [facility] Activity Status Index (DASI).   If patient is undergoing general anesthesia he will need to hold Farxiga for 72 hours prior to the procedure and Ozempic one week before the procedure once the date is determined.   The patient was advised that if he develops new symptoms prior to surgery to contact our office to arrange for a follow-up visit, and he verbalized understanding.  Therefore, based on ACC/AHA guidelines, patient would be at acceptable risk for the planned procedure without further cardiovascular testing. I will route this recommendation to the requesting party via Epic fax function.    A copy of this note will be routed to requesting surgeon.  Time:   Today, I have spent 10 minutes with the patient with telehealth technology discussing medical history, symptoms, and management plan.     Joni Reining, NP  11/11/2022, 1:55 PM

## 2022-11-11 ENCOUNTER — Ambulatory Visit: Payer: PPO | Attending: Internal Medicine

## 2022-11-11 DIAGNOSIS — Z01810 Encounter for preprocedural cardiovascular examination: Secondary | ICD-10-CM

## 2022-11-14 DIAGNOSIS — R0902 Hypoxemia: Secondary | ICD-10-CM | POA: Diagnosis not present

## 2022-11-14 DIAGNOSIS — G4733 Obstructive sleep apnea (adult) (pediatric): Secondary | ICD-10-CM | POA: Diagnosis not present

## 2022-11-14 DIAGNOSIS — I5032 Chronic diastolic (congestive) heart failure: Secondary | ICD-10-CM | POA: Diagnosis not present

## 2022-11-14 DIAGNOSIS — J449 Chronic obstructive pulmonary disease, unspecified: Secondary | ICD-10-CM | POA: Diagnosis not present

## 2022-11-16 ENCOUNTER — Other Ambulatory Visit: Payer: Self-pay | Admitting: Family Medicine

## 2022-11-16 DIAGNOSIS — I10 Essential (primary) hypertension: Secondary | ICD-10-CM

## 2022-11-16 DIAGNOSIS — I5042 Chronic combined systolic (congestive) and diastolic (congestive) heart failure: Secondary | ICD-10-CM

## 2022-12-08 ENCOUNTER — Ambulatory Visit (HOSPITAL_BASED_OUTPATIENT_CLINIC_OR_DEPARTMENT_OTHER): Payer: PPO | Admitting: Pulmonary Disease

## 2022-12-08 ENCOUNTER — Encounter (HOSPITAL_BASED_OUTPATIENT_CLINIC_OR_DEPARTMENT_OTHER): Payer: Self-pay | Admitting: Pulmonary Disease

## 2022-12-08 VITALS — BP 124/64 | HR 69 | Resp 18 | Ht 67.0 in | Wt 228.0 lb

## 2022-12-08 DIAGNOSIS — J4489 Other specified chronic obstructive pulmonary disease: Secondary | ICD-10-CM | POA: Diagnosis not present

## 2022-12-08 DIAGNOSIS — G4733 Obstructive sleep apnea (adult) (pediatric): Secondary | ICD-10-CM | POA: Diagnosis not present

## 2022-12-08 NOTE — Assessment & Plan Note (Signed)
Continue Daliresp Continue steroid/LABA combination

## 2022-12-08 NOTE — Patient Instructions (Signed)
We have to delay your activation to give the nerve some more time to heal from surgery

## 2022-12-08 NOTE — Assessment & Plan Note (Signed)
Status post hypoglossal nerve stimulator implantation. Unfortunately his tongue is still deviated to the right-this indicates delayed healing.  Will have to hold off on activation for another 4 to 6 weeks

## 2022-12-08 NOTE — Progress Notes (Signed)
Subjective:    Patient ID: Kenneth Fuller, male    DOB: 07/01/1945, 77 y.o.   MRN: [id number]  HPI  77 yo remote smoker for FU of asthma/ COPD and OSA   -Doubt he has significant COPD.  Variability in lung function seems to suggest asthma. Feels daliresp really helped him subjectively    PMH - chronic diastolic heart failure coronary artery disease diabetes, stage3 CKD -asthma as a child , failed medical exam , recurred 2018          He smoked less than 10 pack years before he quit in 1971.  He reports lifelong history of asthma.  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   He was unable to tolerate CPAP. Underwent implantation of hypoglossal nerve stimulator device on 10/26/22 Presents for activation today  Significant tests/ events reviewed NPSG 06/2020 >> BiPAP 15/11 ,med FF mask 11/2015 NPSG  very severe OSA, AHI >100. Split night study showed optimal control with CPAP 5-20     PFTs 01/2021 no airway obstruction, moderate restriction ratio 74, FEV1 55%, FVC 53%, 12% bronchodilator response, FEV1 improved to 62%, DLCO 81%    12/2016 PFT >> ratio 84, severe restriction with reduced diffusion capacity.  Total lung capacity 72% FVC, 1.6 L / 40% and DLCO of 19.4/68%.   PFT 1/ 2019 showed FEV1 45%, ratio 68, FVC 48% consistent with moderate to severe airflow obstruction   Feno 01/26/2017 ->  8ppb     High resolution CT chest 01/2017 neg for ILD   CT angiogram chest 05/2020 clear lungs CT sinus March 09, 2017 clear sinuses  Review of Systems neg for any significant sore throat, dysphagia, itching, sneezing, nasal congestion or excess/ purulent secretions, fever, chills, sweats, unintended wt loss, pleuritic or exertional cp, hempoptysis, orthopnea pnd or change in chronic leg swelling. Also denies presyncope, palpitations, heartburn, abdominal pain, nausea, vomiting, diarrhea or change in bowel or urinary habits, dysuria,hematuria,  rash, arthralgias, visual complaints, headache, numbness weakness or ataxia.     Objective:   Physical Exam  Gen. Pleasant, obese, in no distress ENT - no lesions, no post nasal drip, tongue deviates to right Neck: No JVD, no thyromegaly, no carotid bruits Lungs: no use of accessory muscles, no dullness to percussion, decreased without rales or rhonchi  Cardiovascular: Rhythm regular, heart sounds  normal, no murmurs or gallops, no peripheral edema Musculoskeletal: No deformities, no cyanosis or clubbing , no tremors       Assessment & Plan:

## 2022-12-16 ENCOUNTER — Ambulatory Visit: Payer: PPO

## 2022-12-16 VITALS — Ht 67.0 in | Wt 228.0 lb

## 2022-12-16 DIAGNOSIS — Z0000 Encounter for general adult medical examination without abnormal findings: Secondary | ICD-10-CM

## 2022-12-16 NOTE — Progress Notes (Signed)
Subjective:   Kenneth Fuller is a 77 y.o. male who presents for Medicare Annual/Subsequent preventive examination.  Visit Complete: Virtual I connected with  Kenneth Fuller on 12/16/22 by a audio enabled telemedicine application and verified that I am speaking with the correct person using two identifiers.  Patient Location: Home  Provider Location: Home Office  I discussed the limitations of evaluation and management by telemedicine. The patient expressed understanding and agreed to proceed.  Vital Signs: Because this visit was a virtual/telehealth visit, some criteria may be missing or patient reported. Any vitals not documented were not able to be obtained and vitals that have been documented are patient reported.  Patient Medicare AWV questionnaire was completed by the patient on 12/16/2022; I have confirmed that all information answered by patient is correct and no changes since this date.  Cardiac Risk Factors include: advanced age (>74men, >25 women);diabetes mellitus;dyslipidemia;male gender;hypertension;sedentary lifestyle     Objective:    Today's Vitals   12/16/22 1001  Weight: 228 lb (103.4 kg)  Height: 5\' 7"  (1.702 m)   Body mass index is 35.71 kg/m.     12/16/2022   10:05 AM 10/26/2022   10:38 AM 10/19/2022    4:07 PM 07/21/2022   11:00 AM 08/12/2021   10:49 AM 10/17/2020    6:02 PM 08/07/2020   10:23 AM  Advanced Directives  Does Patient Have a Medical Advance Directive? No No No No Yes No No  Type of Psychiatric nurse of Healthcare Power of Attorney in Chart?     No - copy requested    Would patient like information on creating a medical advance directive? Yes (MAU/Ambulatory/Procedural Areas - Information given) Yes (MAU/Ambulatory/Procedural Areas - Information given) No - Patient declined No - Patient declined  No - Patient declined Yes (MAU/Ambulatory/Procedural Areas - Information given)    Current  Medications (verified) Outpatient Encounter Medications as of 12/16/2022  Medication Sig   albuterol (PROVENTIL) (2.5 MG/3ML) 0.083% nebulizer solution USE 1 VIAL IN NEBULIZER EVERY 6 HOURS AS NEEDED FOR WHEEZING AND FOR SHORTNESS OF BREATH   albuterol (VENTOLIN HFA) 108 (90 Base) MCG/ACT inhaler Inhale 2 puffs into the lungs every 6 (six) hours as needed for wheezing or shortness of breath.   atorvastatin (LIPITOR) 40 MG tablet Take 40 mg by mouth daily.   bisoprolol (ZEBETA) 5 MG tablet Take 1/2 (one-half) tablet by mouth once daily   Blood Glucose Monitoring Suppl (ONETOUCH VERIO FLEX SYSTEM) w/Device KIT Use to test blood sugar daily as directed. DX: E11.65   cetirizine (ZYRTEC) 10 MG tablet Take 1 tablet by mouth once daily   Cholecalciferol (VITAMIN D3) 125 MCG (5000 UT) TABS Take 5,000 Units by mouth daily.   dapagliflozin propanediol (FARXIGA) 10 MG TABS tablet Take 1 tablet (10 mg total) by mouth daily.   fluticasone (FLONASE) 50 MCG/ACT nasal spray Place 2 sprays into both nostrils daily. (Patient taking differently: Place 2 sprays into both nostrils daily as needed for allergies.)   furosemide (LASIX) 40 MG tablet Take 40 mg by mouth daily as needed (fluid retention.).   GEMTESA 75 MG TABS Take 75 mg by mouth daily.   glucose blood (ONETOUCH VERIO) test strip test blood sugar daily as directed. DX: E11.65   hydrALAZINE (APRESOLINE) 50 MG tablet Take 1.5 tablets (75 mg total) by mouth 3 (three) times daily.   HYDROcodone-acetaminophen (NORCO/VICODIN) 5-325 MG tablet Take 1-2 tablets by mouth  every 4 (four) hours as needed for severe pain.   HYDROcodone-acetaminophen (NORCO/VICODIN) 5-325 MG tablet Take 1-2 tablets by mouth every 6 (six) hours as needed for moderate pain.   isosorbide dinitrate (ISORDIL) 20 MG tablet Take 1 tablet (20 mg total) by mouth 3 (three) times daily.   Lancets (ONETOUCH DELICA PLUS LANCET33G) MISC USE 1 LANCET TO CHECK GLUCOSE ONCE DAILY AS DIRECTED    nitroGLYCERIN (NITROSTAT) 0.4 MG SL tablet Place 0.4 mg under the tongue every 5 (five) minutes as needed for chest pain.   omeprazole (PRILOSEC) 40 MG capsule TAKE 1 CAPSULE BY MOUTH ONCE DAILY 30-60 MINUTES  BEFORE BREAKFAST   OVER THE COUNTER MEDICATION Take 3 capsules by mouth daily. Super Beets   roflumilast (DALIRESP) 500 MCG TABS tablet Take 1 tablet (500 mcg total) by mouth daily.   Semaglutide,0.25 or 0.5MG /DOS, (OZEMPIC, 0.25 OR 0.5 MG/DOSE,) 2 MG/3ML SOPN Inject 0.5 mg into the skin once a week.   sertraline (ZOLOFT) 100 MG tablet Take 1 tablet (100 mg total) by mouth daily.   tamsulosin (FLOMAX) 0.4 MG CAPS capsule Take 1 capsule (0.4 mg total) by mouth daily after supper.   No facility-administered encounter medications on file as of 12/16/2022.    Allergies (verified) Amlodipine besy-benazepril hcl, Hydrocodone, Oxycodone, and Phenergan [promethazine hcl]   History: Past Medical History:  Diagnosis Date   Allergy    Anxiety    Asthma    CAD (coronary artery disease)    CHF (congestive heart failure) (HCC)    COPD (chronic obstructive pulmonary disease) (HCC)    Depression    Diabetes mellitus    Fibromyalgia    GERD (gastroesophageal reflux disease)    GI bleeding    Gout    Hyperlipidemia    Hypertension    Hypogonadism male    Insomnia    MRSA cellulitis    Neuropathy    Obesity    Shortness of breath dyspnea    with exertion    Sleep apnea    does not use CPAP   Wheezing    no asthma diagnosis   Past Surgical History:  Procedure Laterality Date   BACK SURGERY     BIOPSY  12/20/2019   Procedure: BIOPSY;  Surgeon: Rachael Fee, MD;  Location: [facility];  Service: Endoscopy;;   CARDIAC CATHETERIZATION     COLONOSCOPY     COLONOSCOPY WITH PROPOFOL N/A 12/20/2019   Procedure: COLONOSCOPY WITH PROPOFOL;  Surgeon: Rachael Fee, MD;  Location: [facility];  Service: Endoscopy;  Laterality: N/A;   DRUG INDUCED ENDOSCOPY N/A 07/21/2022    Procedure: DRUG INDUCED ENDOSCOPY;  Surgeon: Oretha Milch, MD;  Location[facility];  Service: Pulmonary;  Laterality: N/A;   ESOPHAGOGASTRODUODENOSCOPY (EGD) WITH PROPOFOL N/A 12/20/2019   Procedure: ESOPHAGOGASTRODUODENOSCOPY (EGD) WITH PROPOFOL;  Surgeon: Rachael Fee, MD;  Location: [facility];  Service: Endoscopy;  Laterality: N/A;   IMPLANTATION OF HYPOGLOSSAL NERVE STIMULATOR Right 10/26/2022   Procedure: IMPLANTATION OF HYPOGLOSSAL NERVE STIMULATOR;  Surgeon: Christia Reading, MD;  Location: [facility];  Service: ENT;  Laterality: Right;   LEFT HEART CATH AND CORONARY ANGIOGRAPHY N/A 12/02/2016   Procedure: LEFT HEART CATH AND CORONARY ANGIOGRAPHY;  Surgeon: Corky Crafts, MD;  Location: [facility] INVASIVE CV LAB;  Service: Cardiovascular;  Laterality: N/A;   LUMBAR LAMINECTOMY/DECOMPRESSION MICRODISCECTOMY N/A 01/02/2014   Procedure: CENTRAL DECOMPRESSION LUMBAR LAMINECTOMY L3-L4, L4-L5;  Surgeon: Jacki Cones, MD;  Location: [facility];  Service: Orthopedics;  Laterality:  N/A;   neck fusion     POLYPECTOMY  12/20/2019   Procedure: POLYPECTOMY;  Surgeon: Rachael Fee, MD;  Location: [facility];  Service: Endoscopy;;   Family History  Problem Relation Age of Onset   Colon cancer Mother    Diabetes Father        siblings   Heart disease Father        brother   Heart attack Father    Kidney disease Sister    Heart failure Sister    Drug abuse Sister    Heart disease Brother    Heart disease Brother    Deep vein thrombosis Brother    Heart attack Son 55   Colon polyps Neg Hx    Migraines Neg Hx    Social History   Socioeconomic History   Marital status: Married    Spouse name: Lupita Leash   Number of children: 3   Years of education: 8   Highest education level: GED or equivalent  Occupational History   Occupation: retired/disability 1999    Employer: DISABLED    Comment: Textiles  Tobacco Use   Smoking status: Former    Current packs/day: 0.00     Average packs/day: 1 pack/day for 5.0 years (5.0 ttl pk-yrs)    Types: Cigarettes    Start date: 02/09/1964    Quit date: 02/08/1969    Years since quitting: 53.8   Smokeless tobacco: Former    Quit date: 1971  Advertising account planner   Vaping status: Never Used  Substance and Sexual Activity   Alcohol use: No    Alcohol/week: 0.0 standard drinks of alcohol   Drug use: No   Sexual activity: Not Currently  Other Topics Concern   Not on file  Social History Narrative   Drinks caffeine tea occasionally   Lives with wife Lupita Leash, daughter, and granddaughter    One son, Gaynelle Adu passed away last year suddenly   Social Determinants of Health   Financial Resource Strain: Low Risk  (12/16/2022)   Overall Financial Resource Strain (CARDIA)    Difficulty of Paying Living Expenses: Not hard at all  Food Insecurity: No Food Insecurity (12/16/2022)   Hunger Vital Sign    Worried About Running Out of Food in the Last Year: Never true    Ran Out of Food in the Last Year: Never true  Transportation Needs: No Transportation Needs (12/16/2022)   PRAPARE - Administrator, Civil Service (Medical): No    Lack of Transportation (Non-Medical): No  Physical Activity: Inactive (12/16/2022)   Exercise Vital Sign    Days of Exercise per Week: 0 days    Minutes of Exercise per Session: 0 min  Stress: No Stress Concern Present (12/16/2022)   [employer] of Occupational Health - Occupational Stress Questionnaire    Feeling of Stress : Not at all  Social Connections: Moderately Integrated (12/16/2022)   Social Connection and Isolation Panel [NHANES]    Frequency of Communication with Friends and Family: More than three times a week    Frequency of Social Gatherings with Friends and Family: More than three times a week    Attends Religious Services: More than 4 times per year    Active Member of [employer] or Organizations: No    Attends Banker Meetings: Never    Marital Status: Married     Tobacco Counseling Counseling given: Not Answered   Clinical Intake:  Pre-visit preparation completed: Yes  Pain : No/denies pain  Nutritional Risks: None Diabetes: Yes CBG done?: No Did pt. bring in CBG monitor from home?: No  How often do you need to have someone help you when you read instructions, pamphlets, or other written materials from your doctor or pharmacy?: 1 - Never  Interpreter Needed?: No  Information entered by :: Renie Ora, LPN   Activities of Daily Living    12/16/2022   10:05 AM 10/26/2022   10:43 AM  In your present state of health, do you have any difficulty performing the following activities:  Hearing? 0 0  Vision? 0 0  Difficulty concentrating or making decisions? 0 0  Walking or climbing stairs? 0 0  Dressing or bathing? 0 0  Doing errands, shopping? 0   Preparing Food and eating ? N   Using the Toilet? N   In the past six months, have you accidently leaked urine? N   Do you have problems with loss of bowel control? N   Managing your Medications? N   Managing your Finances? N   Housekeeping or managing your Housekeeping? N     Patient Care Team: [name], Elige Radon, MD as PCP - General (Family Medicine) Croitoru, Rachelle Hora, MD as PCP - Cardiology (Cardiology) Michaelle Copas, MD as Referring Physician (Optometry) Randa Spike, Kelton Pillar, LCSW as Social Worker (Licensed Clinical Social Worker) Croitoru, Rachelle Hora, MD as Consulting Physician (Cardiology) Danella Maiers, [facility] (Pharmacist) Randa Lynn, MD as Consulting Physician (Nephrology) Rachael Fee, MD as Attending Physician (Gastroenterology) Oretha Milch, MD as Consulting Physician (Pulmonary Disease)  Indicate any recent Medical Services you may have received from other than [facility] providers in the past year (date may be approximate).     Assessment:   This is a routine wellness examination for [place].  Hearing/Vision screen Vision Screening - Comments:: Wears rx  glasses - up to date with routine eye exams with  Dr.Johnson    Goals Addressed             This Visit's Progress    DIET - EAT MORE FRUITS AND VEGETABLES         Depression Screen    12/16/2022   10:04 AM 05/31/2022   11:31 AM 12/03/2021    9:22 AM 11/19/2021    9:49 AM 09/14/2021    3:44 PM 08/12/2021   10:47 AM 06/25/2021    2:15 PM  PHQ 2/9 Scores  PHQ - 2 Score 0 0 0 0 0 1 2  PHQ- 9 Score   0 0 3 1 7     Fall Risk    12/16/2022   10:02 AM 05/31/2022   11:31 AM 12/03/2021    9:22 AM 11/19/2021    9:49 AM 09/14/2021    3:44 PM  Fall Risk   Falls in the past year? 0 0 0 0 0  Number falls in past yr: 0      Injury with Fall? 0      Risk for fall due to : No Fall Risks      Follow up Falls prevention discussed        MEDICARE RISK AT HOME: Medicare Risk at Home Any stairs in or around the home?: Yes If so, are there any without handrails?: No Home free of loose throw rugs in walkways, pet beds, electrical cords, etc?: Yes Adequate lighting in your home to reduce risk of falls?: Yes Life alert?: No Use of a cane, walker or w/c?: No Grab bars in the bathroom?: Yes  Shower chair or bench in shower?: Yes Elevated toilet seat or a handicapped toilet?: Yes  TIMED UP AND GO:  Was the test performed?  No    Cognitive Function:    12/20/2016   11:27 AM 12/04/2014   10:11 AM  MMSE - Mini Mental State Exam  Orientation to time 5 5  Orientation to Place 5 5  Registration 3 3  Attention/ Calculation 5 5  Recall 3 3  Language- name 2 objects 2 2  Language- repeat 1 1  Language- follow 3 step command 3 3  Language- read & follow direction 1 1  Write a sentence 1 1  Copy design 1 1  Total score 30 30        12/16/2022   10:06 AM 08/12/2021   10:53 AM 07/27/2019    1:55 PM 07/17/2018    3:54 PM  6CIT Screen  What Year? 0 points 0 points 0 points 0 points  What month? 0 points 0 points 0 points 0 points  What time? 0 points 0 points 0 points 0 points  Count back  from 20 0 points 0 points 0 points 0 points  Months in reverse 0 points 0 points 0 points 2 points  Repeat phrase 0 points 0 points 2 points 0 points  Total Score 0 points 0 points 2 points 2 points    Immunizations Immunization History  Administered Date(s) Administered   Fluad Quad(high Dose 65+) 11/15/2018, 01/26/2021, 10/30/2021   Influenza Whole 10/09/2008   Influenza, High Dose Seasonal PF 11/29/2016, 11/08/2017   Influenza,inj,Quad PF,6+ Mos 12/04/2013, 12/04/2014, 12/23/2015   Pneumococcal Conjugate-13 12/04/2013   Pneumococcal Polysaccharide-23 08/26/2011   Td 11/08/2017    TDAP status: Up to date  Flu Vaccine status: Up to date  Pneumococcal vaccine status: Up to date  Covid-19 vaccine status: Completed vaccines  Qualifies for Shingles Vaccine? Yes   Zostavax completed No   Shingrix Completed?: No.    Education has been provided regarding the importance of this vaccine. Patient has been advised to call insurance company to determine out of pocket expense if they have not yet received this vaccine. Advised may also receive vaccine at local pharmacy or Health Dept. Verbalized acceptance and understanding.  Screening Tests Health Maintenance  Topic Date Due   OPHTHALMOLOGY EXAM  09/02/2022   Zoster Vaccines- Shingrix (1 of 2) 12/21/2022 (Originally 05/02/1964)   COVID-19 Vaccine (1) 01/01/2023 (Originally 05/03/1950)   INFLUENZA VACCINE  05/09/2023 (Originally 09/09/2022)   HEMOGLOBIN A1C  03/23/2023   Diabetic kidney evaluation - Urine ACR  05/31/2023   FOOT EXAM  05/31/2023   Diabetic kidney evaluation - eGFR measurement  10/22/2023   Medicare Annual Wellness (AWV)  12/16/2023   Colonoscopy  12/19/2024   DTaP/Tdap/Td (2 - Tdap) 11/09/2027   Pneumonia Vaccine 95+ Years old  Completed   Hepatitis C Screening  Completed   HPV VACCINES  Aged Out    Health Maintenance  Health Maintenance Due  Topic Date Due   OPHTHALMOLOGY EXAM  09/02/2022    Colorectal cancer  screening: No longer required.   Lung Cancer Screening: (Low Dose CT Chest recommended if Age 59-80 years, 20 pack-year currently smoking OR have quit w/in 15years.) does not qualify.   Lung Cancer Screening Referral: n/a  Additional Screening:  Hepatitis C Screening: does not qualify; Completed 08/21/2015  Vision Screening: Recommended annual ophthalmology exams for early detection of glaucoma and other disorders of the eye. Is the patient up to date with their  annual eye exam?  Yes  Who is the provider or what is the name of the office in which the patient attends annual eye exams? Dr.Johnson  If pt is not established with a provider, would they like to be referred to a provider to establish care? No .   Dental Screening: Recommended annual dental exams for proper oral hygiene  Diabetic Foot Exam: Diabetic Foot Exam: Overdue, Pt has been advised about the importance in completing this exam. Pt is scheduled for diabetic foot exam on next office visit .  Community Resource Referral / Chronic Care Management: CRR required this visit?  No   CCM required this visit?  No     Plan:     I have personally reviewed and noted the following in the patient's chart:   Medical and social history Use of alcohol, tobacco or illicit drugs  Current medications and supplements including opioid prescriptions. Patient is not currently taking opioid prescriptions. Functional ability and status Nutritional status Physical activity Advanced directives List of other physicians Hospitalizations, surgeries, and ER visits in previous 12 months Vitals Screenings to include cognitive, depression, and falls Referrals and appointments  In addition, I have reviewed and discussed with patient certain preventive protocols, quality metrics, and best practice recommendations. A written personalized care plan for preventive services as well as general preventive health recommendations were provided to  patient.     Lorrene Reid, LPN   05/13/4096   After Visit Summary: (MyChart) Due to this being a telephonic visit, the after visit summary with patients personalized plan was offered to patient via MyChart   Nurse Notes: none

## 2022-12-16 NOTE — Patient Instructions (Signed)
Kenneth Fuller , Thank you for taking time to come for your Medicare Wellness Visit. I appreciate your ongoing commitment to your health goals. Please review the following plan we discussed and let me know if I can assist you in the future.   Referrals/Orders/Follow-Ups/Clinician Recommendations: Aim for 30 minutes of exercise or brisk walking, 6-8 glasses of water, and 5 servings of fruits and vegetables each day.   This is a list of the screening recommended for you and due dates:  Health Maintenance  Topic Date Due   Eye exam for diabetics  09/02/2022   Zoster (Shingles) Vaccine (1 of 2) 12/21/2022*   COVID-19 Vaccine (1) 01/01/2023*   Flu Shot  05/09/2023*   Hemoglobin A1C  03/23/2023   Yearly kidney health urinalysis for diabetes  05/31/2023   Complete foot exam   05/31/2023   Yearly kidney function blood test for diabetes  10/22/2023   Medicare Annual Wellness Visit  12/16/2023   Colon Cancer Screening  12/19/2024   DTaP/Tdap/Td vaccine (2 - Tdap) 11/09/2027   Pneumonia Vaccine  Completed   Hepatitis C Screening  Completed   HPV Vaccine  Aged Out  *Topic was postponed. The date shown is not the original due date.    Advanced directives: (Provided) Advance directive discussed with you today. I have provided a copy for you to complete at home and have notarized. Once this is complete, please bring a copy in to our office so we can scan it into your chart. Information on Advanced Care Planning can be found at [facility] Advance Health Care Directives Advance Health Care Directives ([email or web address]    Next Medicare Annual Wellness Visit scheduled for next year: Yes  insert Preventive Care attachment Insert FALL PREVENTION attachment if needed

## 2022-12-23 ENCOUNTER — Other Ambulatory Visit: Payer: Self-pay | Admitting: Internal Medicine

## 2022-12-23 ENCOUNTER — Encounter: Payer: Self-pay | Admitting: Family Medicine

## 2022-12-23 ENCOUNTER — Ambulatory Visit: Payer: PPO | Admitting: Family Medicine

## 2022-12-23 VITALS — BP 121/64 | HR 72 | Ht 67.0 in | Wt 226.0 lb

## 2022-12-23 DIAGNOSIS — E785 Hyperlipidemia, unspecified: Secondary | ICD-10-CM

## 2022-12-23 DIAGNOSIS — I10 Essential (primary) hypertension: Secondary | ICD-10-CM

## 2022-12-23 DIAGNOSIS — J45991 Cough variant asthma: Secondary | ICD-10-CM | POA: Diagnosis not present

## 2022-12-23 DIAGNOSIS — I5032 Chronic diastolic (congestive) heart failure: Secondary | ICD-10-CM

## 2022-12-23 DIAGNOSIS — I130 Hypertensive heart and chronic kidney disease with heart failure and stage 1 through stage 4 chronic kidney disease, or unspecified chronic kidney disease: Secondary | ICD-10-CM | POA: Diagnosis not present

## 2022-12-23 DIAGNOSIS — J9611 Chronic respiratory failure with hypoxia: Secondary | ICD-10-CM

## 2022-12-23 DIAGNOSIS — N1832 Chronic kidney disease, stage 3b: Secondary | ICD-10-CM

## 2022-12-23 DIAGNOSIS — I2510 Atherosclerotic heart disease of native coronary artery without angina pectoris: Secondary | ICD-10-CM | POA: Diagnosis not present

## 2022-12-23 DIAGNOSIS — J441 Chronic obstructive pulmonary disease with (acute) exacerbation: Secondary | ICD-10-CM | POA: Diagnosis not present

## 2022-12-23 DIAGNOSIS — Z23 Encounter for immunization: Secondary | ICD-10-CM

## 2022-12-23 DIAGNOSIS — E1169 Type 2 diabetes mellitus with other specified complication: Secondary | ICD-10-CM

## 2022-12-23 DIAGNOSIS — E119 Type 2 diabetes mellitus without complications: Secondary | ICD-10-CM

## 2022-12-23 DIAGNOSIS — E1142 Type 2 diabetes mellitus with diabetic polyneuropathy: Secondary | ICD-10-CM | POA: Diagnosis not present

## 2022-12-23 LAB — BAYER DCA HB A1C WAIVED: HB A1C (BAYER DCA - WAIVED): 5.4 % (ref 4.8–5.6)

## 2022-12-23 MED ORDER — ALBUTEROL SULFATE HFA 108 (90 BASE) MCG/ACT IN AERS: 2.0000 | INHALATION_SPRAY | Freq: Four times a day (QID) | RESPIRATORY_TRACT | 2 refills | Status: DC | PRN

## 2022-12-23 MED ORDER — CETIRIZINE HCL 10 MG PO TABS
10.0000 mg | ORAL_TABLET | Freq: Every day | ORAL | 3 refills | Status: DC
Start: 2022-12-23 — End: 2023-08-24

## 2022-12-23 MED ORDER — ALBUTEROL SULFATE (2.5 MG/3ML) 0.083% IN NEBU
2.5000 mg | INHALATION_SOLUTION | Freq: Four times a day (QID) | RESPIRATORY_TRACT | 1 refills | Status: DC | PRN
Start: 2022-12-23 — End: 2023-08-24

## 2022-12-23 MED ORDER — DAPAGLIFLOZIN PROPANEDIOL 10 MG PO TABS
10.0000 mg | ORAL_TABLET | Freq: Every day | ORAL | 3 refills | Status: DC
Start: 2022-12-23 — End: 2023-12-12

## 2022-12-23 NOTE — Progress Notes (Signed)
BP 121/64   Pulse 72   Ht 5\' 7"  (1.702 m)   Wt 226 lb (102.5 kg)   SpO2 95%   BMI 35.40 kg/m    Subjective:   Patient ID: Kenneth Fuller, male    DOB: 1945-02-24, 77 y.o.   MRN: [id number]  HPI: Kenneth Fuller is a 77 y.o. male presenting on 12/23/2022 for Medical Management of Chronic Issues, Diabetes, Hypertension, and Chronic Kidney Disease  Type 2 diabetes mellitus Patient comes in today for recheck of his diabetes. Patient is currently taking [place] and Ozempic. Patient has seen an ophthalmologist this year. Patient denies any new issues with their feet. His diabetes is complicated by hypertension and hyperlipidemia.   Hypertension Patient is currently taking hydralazine, Imdur, olmesartan, and furosemide. His blood pressure today is 121/64. He denies lightheadedness or dizziness, headaches, vision changes, chest pain, or shortness of breath.    Hyperlipidemia Patient is currently taking atorvastatin. He denies myalgias or weakness. He does not have a history of liver damage from it.   COPD Patient is currently taking Daliresp and albuterol. He denies major coughing or wheezing spells. He uses the albuterol about once monthly. He has 0 nighttime symptoms and 0 daytime symptoms per week.  Relevant past medical, surgical, family and social history reviewed and updated as indicated. Interim medical history since our last visit reviewed. Allergies and medications reviewed and updated.  Review of Systems  Constitutional:  Negative for chills and fever.  HENT:  Negative for congestion, sore throat and trouble swallowing.   Eyes:  Negative for visual disturbance.  Respiratory:  Negative for cough, chest tightness and shortness of breath.   Cardiovascular:  Negative for chest pain, palpitations and leg swelling.  Gastrointestinal:  Negative for abdominal pain, constipation and diarrhea.  Genitourinary:  Negative for difficulty urinating and dysuria.  Musculoskeletal:   Positive for back pain. Negative for myalgias.  Neurological:  Negative for dizziness, syncope, weakness, light-headedness and headaches.  Psychiatric/Behavioral:  Negative for sleep disturbance.     Per HPI unless specifically indicated above   Allergies as of 12/23/2022       Reactions   Amlodipine Besy-benazepril Hcl Swelling, Other (See Comments)   Makes tongue swell (lotrel)   Hydrocodone Itching   Can tolerate in low doses   Oxycodone Itching   Phenergan [promethazine Hcl] Other (See Comments)   "I can't remember."        Medication List        Accurate as of December 23, 2022 11:20 AM. If you have any questions, ask your nurse or doctor.          albuterol 108 (90 Base) MCG/ACT inhaler Commonly known as: VENTOLIN HFA Inhale 2 puffs into the lungs every 6 (six) hours as needed for wheezing or shortness of breath. What changed: Another medication with the same name was changed. Make sure you understand how and when to take each. Changed by: Elige Radon Alejandro Gamel   albuterol (2.5 MG/3ML) 0.083% nebulizer solution Commonly known as: PROVENTIL Take 3 mLs (2.5 mg total) by nebulization every 6 (six) hours as needed for wheezing or shortness of breath. What changed: See the new instructions. Changed by: Elige Radon Alik Mawson   atorvastatin 40 MG tablet Commonly known as: LIPITOR Take 40 mg by mouth daily.   bisoprolol 5 MG tablet Commonly known as: ZEBETA Take 1/2 (one-half) tablet by mouth once daily   cetirizine 10 MG tablet Commonly known as: ZYRTEC Take 1 tablet (10 mg  total) by mouth daily.   dapagliflozin propanediol 10 MG Tabs tablet Commonly known as: Farxiga Take 1 tablet (10 mg total) by mouth daily.   fluticasone 50 MCG/ACT nasal spray Commonly known as: FLONASE Place 2 sprays into both nostrils daily. What changed:  when to take this reasons to take this   furosemide 40 MG tablet Commonly known as: LASIX Take 40 mg by mouth daily as needed  (fluid retention.).   Gemtesa 75 MG Tabs Generic drug: Vibegron Take 75 mg by mouth daily.   hydrALAZINE 50 MG tablet Commonly known as: APRESOLINE Take 1.5 tablets (75 mg total) by mouth 3 (three) times daily.   HYDROcodone-acetaminophen 5-325 MG tablet Commonly known as: NORCO/VICODIN Take 1-2 tablets by mouth every 4 (four) hours as needed for severe pain.   HYDROcodone-acetaminophen 5-325 MG tablet Commonly known as: NORCO/VICODIN Take 1-2 tablets by mouth every 6 (six) hours as needed for moderate pain.   isosorbide dinitrate 20 MG tablet Commonly known as: ISORDIL Take 1 tablet (20 mg total) by mouth 3 (three) times daily.   nitroGLYCERIN 0.4 MG SL tablet Commonly known as: NITROSTAT Place 0.4 mg under the tongue every 5 (five) minutes as needed for chest pain.   omeprazole 40 MG capsule Commonly known as: PRILOSEC TAKE 1 CAPSULE BY MOUTH ONCE DAILY 30-60 MINUTES  BEFORE BREAKFAST   OneTouch Delica Plus Lancet33G Misc USE 1 LANCET TO CHECK GLUCOSE ONCE DAILY AS DIRECTED   OneTouch Verio Flex System w/Device Kit Use to test blood sugar daily as directed. DX: E11.65   OneTouch Verio test strip Generic drug: glucose blood test blood sugar daily as directed. DX: E11.65   OVER THE COUNTER MEDICATION Take 3 capsules by mouth daily. Super Beets   Ozempic (0.25 or 0.5 MG/DOSE) 2 MG/3ML Sopn Generic drug: Semaglutide(0.25 or 0.5MG /DOS) Inject 0.5 mg into the skin once a week.   roflumilast 500 MCG Tabs tablet Commonly known as: DALIRESP Take 1 tablet (500 mcg total) by mouth daily.   sertraline 100 MG tablet Commonly known as: ZOLOFT Take 1 tablet (100 mg total) by mouth daily.   tamsulosin 0.4 MG Caps capsule Commonly known as: FLOMAX Take 1 capsule (0.4 mg total) by mouth daily after supper.   Vitamin D3 125 MCG (5000 UT) Tabs Take 5,000 Units by mouth daily.         Objective:   BP 121/64   Pulse 72   Ht 5\' 7"  (1.702 m)   Wt 226 lb (102.5 kg)    SpO2 95%   BMI 35.40 kg/m   Wt Readings from Last 3 Encounters:  12/23/22 226 lb (102.5 kg)  12/16/22 228 lb (103.4 kg)  12/08/22 228 lb (103.4 kg)    Physical Exam Vitals and nursing note reviewed.  Constitutional:      Appearance: Normal appearance. He is obese.  HENT:     Head: Normocephalic and atraumatic.     Right Ear: External ear normal.     Left Ear: External ear normal.  Eyes:     Conjunctiva/sclera: Conjunctivae normal.  Neck:     Comments: Healed scar on right upper neck from Inspire implantation, mildly tender to palpation Cardiovascular:     Rate and Rhythm: Normal rate and regular rhythm.     Heart sounds: Normal heart sounds.  Pulmonary:     Effort: Pulmonary effort is normal.     Breath sounds: Normal breath sounds. No wheezing or rales.  Abdominal:     General: Abdomen is  flat. There is no distension.     Palpations: Abdomen is soft.     Tenderness: There is no abdominal tenderness. There is no right CVA tenderness or left CVA tenderness.  Musculoskeletal:     Cervical back: Normal range of motion and neck supple.     Right lower leg: No edema.     Left lower leg: No edema.  Skin:    General: Skin is warm and dry.     Comments: Healed scar on right upper chest from Inspire placement  Neurological:     Mental Status: He is alert and oriented to person, place, and time.  Psychiatric:        Mood and Affect: Mood normal.        Behavior: Behavior normal.        Thought Content: Thought content normal.        Judgment: Judgment normal.     Assessment & Plan:   Problem List Items Addressed This Visit       Cardiovascular and Mediastinum   Essential hypertension   Coronary artery disease involving native coronary artery of native heart without angina pectoris   CHF (congestive heart failure) (HCC)     Respiratory   Cough variant asthma   Relevant Medications   albuterol (VENTOLIN HFA) 108 (90 Base) MCG/ACT inhaler   albuterol (PROVENTIL) (2.5  MG/3ML) 0.083% nebulizer solution   Chronic respiratory failure with hypoxia (HCC)     Endocrine   Diabetes mellitus type II, non insulin dependent (HCC)   Relevant Medications   dapagliflozin propanediol (FARXIGA) 10 MG TABS tablet   Hyperlipidemia associated with type 2 diabetes mellitus (HCC)   Relevant Medications   dapagliflozin propanediol (FARXIGA) 10 MG TABS tablet     Genitourinary   CKD (chronic kidney disease), stage III (HCC)   Relevant Medications   dapagliflozin propanediol (FARXIGA) 10 MG TABS tablet   Other Relevant Orders   CBC with Differential/Platelet   CMP14+EGFR   Lipid panel   Bayer DCA Hb A1c Waived   Other Visit Diagnoses     Type 2 diabetes mellitus with diabetic polyneuropathy, without long-term current use of insulin (HCC)    -  Primary   Relevant Medications   dapagliflozin propanediol (FARXIGA) 10 MG TABS tablet   Other Relevant Orders   CBC with Differential/Platelet   CMP14+EGFR   Lipid panel   Bayer DCA Hb A1c Waived   Chronic obstructive pulmonary disease with acute exacerbation (HCC)       Relevant Medications   cetirizine (ZYRTEC) 10 MG tablet   albuterol (VENTOLIN HFA) 108 (90 Base) MCG/ACT inhaler   albuterol (PROVENTIL) (2.5 MG/3ML) 0.083% nebulizer solution       Patient is doing well with no complaints. Blood pressure well-controlled at 121/64. Diabetes well-controlled with HgbA1c 5.4%. Patient tolerating atorvastatin well. Will recheck lipid panel today. Patient having minimal symptoms from COPD while on Daliresp. Will clear for back surgery with Dr. Shon Baton as long as cardiology and pulmonology agree.  Follow up plan: Return in about 3 months (around 03/25/2023), or if symptoms worsen or fail to improve, for Diabetes recheck.  Counseling provided for all of the vaccine components Orders Placed This Encounter  Procedures   CBC with Differential/Platelet   CMP14+EGFR   Lipid panel   Bayer DCA Hb A1c Waived    Gillermina Phy,  Medical Student [facility] 12/23/2022, 11:20 AM  Patient seen and examined with Gillermina Phy, medical student, agree with assessment and  plan above.  A1c looks good and his breathing is doing well and relatively doing very well.  He is waiting for his inspire to be activated and then he is going for back surgery.  From our end we will clear for back surgery as long as cardiology and pulmonology agree. Arville Care, MD [facility] 12/29/2022, 1:28 PM

## 2022-12-24 LAB — CBC WITH DIFFERENTIAL/PLATELET
Basophils Absolute: 0.1 10*3/uL (ref 0.0–0.2)
Basos: 1 %
EOS (ABSOLUTE): 0.1 10*3/uL (ref 0.0–0.4)
Eos: 2 %
Hematocrit: 48 % (ref 37.5–51.0)
Hemoglobin: 15.1 g/dL (ref 13.0–17.7)
Immature Grans (Abs): 0 10*3/uL (ref 0.0–0.1)
Immature Granulocytes: 0 %
Lymphocytes Absolute: 2.2 10*3/uL (ref 0.7–3.1)
Lymphs: 32 %
MCH: 26.3 pg — ABNORMAL LOW (ref 26.6–33.0)
MCHC: 31.5 g/dL (ref 31.5–35.7)
MCV: 84 fL (ref 79–97)
Monocytes Absolute: 0.5 10*3/uL (ref 0.1–0.9)
Monocytes: 8 %
Neutrophils Absolute: 3.9 10*3/uL (ref 1.4–7.0)
Neutrophils: 57 %
Platelets: 221 10*3/uL (ref 150–450)
RBC: 5.75 x10E6/uL (ref 4.14–5.80)
RDW: 14.3 % (ref 11.6–15.4)
WBC: 6.9 10*3/uL (ref 3.4–10.8)

## 2022-12-24 LAB — CMP14+EGFR
ALT: 14 [IU]/L (ref 0–44)
AST: 20 [IU]/L (ref 0–40)
Albumin: 4.1 g/dL (ref 3.8–4.8)
Alkaline Phosphatase: 96 [IU]/L (ref 44–121)
BUN/Creatinine Ratio: 16 (ref 10–24)
BUN: 16 mg/dL (ref 8–27)
Bilirubin Total: 0.4 mg/dL (ref 0.0–1.2)
CO2: 23 mmol/L (ref 20–29)
Calcium: 9.1 mg/dL (ref 8.6–10.2)
Chloride: 101 mmol/L (ref 96–106)
Creatinine, Ser: 1.02 mg/dL (ref 0.76–1.27)
Globulin, Total: 2.5 g/dL (ref 1.5–4.5)
Glucose: 140 mg/dL — ABNORMAL HIGH (ref 70–99)
Potassium: 4 mmol/L (ref 3.5–5.2)
Sodium: 139 mmol/L (ref 134–144)
Total Protein: 6.6 g/dL (ref 6.0–8.5)
eGFR: 76 mL/min/{1.73_m2} (ref >59)

## 2022-12-24 LAB — LIPID PANEL
Chol/HDL Ratio: 3 ratio (ref 0.0–5.0)
Cholesterol, Total: 131 mg/dL (ref 100–199)
HDL: 43 mg/dL (ref >39)
LDL Chol Calc (NIH): 67 mg/dL (ref 0–99)
Triglycerides: 119 mg/dL (ref 0–149)
VLDL Cholesterol Cal: 21 mg/dL (ref 5–40)

## 2023-01-17 ENCOUNTER — Ambulatory Visit (HOSPITAL_BASED_OUTPATIENT_CLINIC_OR_DEPARTMENT_OTHER): Payer: PPO | Admitting: Pulmonary Disease

## 2023-01-17 ENCOUNTER — Encounter (HOSPITAL_BASED_OUTPATIENT_CLINIC_OR_DEPARTMENT_OTHER): Payer: Self-pay | Admitting: Pulmonary Disease

## 2023-01-17 VITALS — BP 138/78 | HR 77 | Resp 18 | Ht 67.0 in | Wt 229.0 lb

## 2023-01-17 DIAGNOSIS — G4733 Obstructive sleep apnea (adult) (pediatric): Secondary | ICD-10-CM | POA: Diagnosis not present

## 2023-01-17 NOTE — Progress Notes (Signed)
   Subjective:    Patient ID: Kenneth Fuller, male    DOB: 01-28-1946, 77 y.o.   MRN: [id number]  HPI 77 yo remote smoker for FU of asthma/ COPD and OSA    -Doubt he has significant COPD.  Variability in lung function seems to suggest asthma. Feels daliresp really helped him subjectively    PMH - chronic diastolic heart failure coronary artery disease diabetes, stage3 CKD -asthma as a child , failed medical exam , recurred 2018          He smoked less than 10 pack years before he quit in 1971.  He reports lifelong history of asthma.  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    He was unable to tolerate CPAP. Underwent implantation of hypoglossal nerve stimulator device on 10/26/22  On his initial visit he had tongue deviation and we had to delay activation.  He presents for activation today. His breathing is doing okay, denies wheezing cough or sputum production   Significant tests/ events reviewed NPSG 06/2020 >> BiPAP 15/11 ,med FF mask 11/2015 NPSG  very severe OSA, AHI >100. Split night study showed optimal control with CPAP 5-20     PFTs 01/2021 no airway obstruction, moderate restriction ratio 74, FEV1 55%, FVC 53%, 12% bronchodilator response, FEV1 improved to 62%, DLCO 81%    12/2016 PFT >> ratio 84, severe restriction with reduced diffusion capacity.  Total lung capacity 72% FVC, 1.6 L / 40% and DLCO of 19.4/68%.   PFT 1/ 2019 showed FEV1 45%, ratio 68, FVC 48% consistent with moderate to severe airflow obstruction   Feno 01/26/2017 ->  8ppb     High resolution CT chest 01/2017 neg for ILD   CT angiogram chest 05/2020 clear lungs CT sinus March 09, 2017 clear sinuses  Review of Systems neg for any significant sore throat, dysphagia, itching, sneezing, nasal congestion or excess/ purulent secretions, fever, chills, sweats, unintended wt loss, pleuritic or exertional cp, hempoptysis, orthopnea pnd or change in chronic leg  swelling. Also denies presyncope, palpitations, heartburn, abdominal pain, nausea, vomiting, diarrhea or change in bowel or urinary habits, dysuria,hematuria, rash, arthralgias, visual complaints, headache, numbness weakness or ataxia.      Objective:   Physical Exam  Gen. Pleasant, obese, in no distress ENT - no lesions, no post nasal drip , incisions ok Neck: No JVD, no thyromegaly, no carotid bruits Lungs: no use of accessory muscles, no dullness to percussion, decreased without rales or rhonchi  Cardiovascular: Rhythm regular, heart sounds  normal, no murmurs or gallops, no peripheral edema Musculoskeletal: No deformities, no cyanosis or clubbing , no tremors       Assessment & Plan:

## 2023-01-17 NOTE — Assessment & Plan Note (Signed)
Hypoglossal nerve stimulator was activated today.  Incision was checked, tongue protrusion was examined Programming : The activation workflow was followed 1.  Stimulation level : Sensation 1.6 V , functional level 1.7 V lower limit, upper limit 2.7 V 2.  Start delay 30 minutes, pause time 15 minutes, duration 8 hours 3.  Sensing waveform was analyzed for 3 minutes 4.  Sleep remote education was provided patient demonstrated competency with the remote and was aware of patient Instruction videos and sleep remote guide 5.  Patient was instructed to step up levels by 1 level (0.1 V ) every week 6.  Check-in visit in 1 month after activation visit to ensure that they are stepping up levels, using therapy " all night, every night" and to evaluate subjective benefit.   7.  Inspire titration sleep study will be scheduled 3 months after the activation visit

## 2023-01-17 NOTE — Patient Instructions (Addendum)
Your device was activated today Set at level 1 =1.7 V  Increase by 1 level every Monday until you feel tongue discomfort Start delay Pause 15 mins Duration 8 hrs

## 2023-02-12 ENCOUNTER — Other Ambulatory Visit: Payer: Self-pay | Admitting: Family Medicine

## 2023-02-12 DIAGNOSIS — I5042 Chronic combined systolic (congestive) and diastolic (congestive) heart failure: Secondary | ICD-10-CM

## 2023-02-12 DIAGNOSIS — I10 Essential (primary) hypertension: Secondary | ICD-10-CM

## 2023-02-22 ENCOUNTER — Encounter: Payer: Self-pay | Admitting: Licensed Clinical Social Worker

## 2023-02-22 ENCOUNTER — Encounter (HOSPITAL_BASED_OUTPATIENT_CLINIC_OR_DEPARTMENT_OTHER): Payer: Self-pay | Admitting: Pulmonary Disease

## 2023-02-22 ENCOUNTER — Ambulatory Visit (HOSPITAL_BASED_OUTPATIENT_CLINIC_OR_DEPARTMENT_OTHER): Payer: PPO | Admitting: Pulmonary Disease

## 2023-02-22 VITALS — BP 126/72 | HR 77 | Resp 18 | Ht 67.0 in | Wt 232.6 lb

## 2023-02-22 DIAGNOSIS — G4733 Obstructive sleep apnea (adult) (pediatric): Secondary | ICD-10-CM

## 2023-02-22 NOTE — Progress Notes (Signed)
 Subjective:    Patient ID: Kenneth Fuller, male    DOB: [birth year], 78 y.o.   MRN: [id number]  HPI  78 yo remote smoker for FU of asthma/ COPD and OSA    -Doubt he has significant COPD.  Variability in lung function seems to suggest asthma. Feels daliresp  really helped him subjectively    PMH - chronic diastolic heart failure coronary artery disease diabetes, stage3 CKD -asthma as a child , failed medical exam , recurred 2018          He smoked less than 10 pack years before he quit in 1971.  He reports lifelong history of asthma.  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    He was unable to tolerate CPAP. Underwent implantation of hypoglossal nerve stimulator device on 10/26/22  Activation 01/17/23 delayed  Sensation 1.6 V , functional level 1.7 V lower limit, upper limit 2.7 V  Start delay 30 minutes, pause time 15 minutes, duration 8 hours  1 month follow-up visit. He increased to 2.0 V and has backed down to 1.8 V due to tongue discomfort. Wife not with him today, he denies snoring, feels rested. Download was reviewed which shows excellent usage 87% more than 4 hours, missed 4 nights no pauses.     Significant tests/ events reviewed NPSG 06/2020 >> BiPAP 15/11 ,med FF mask 11/2015 NPSG  very severe OSA, AHI >100. Split night study showed optimal control with CPAP 5-20     PFTs 01/2021 no airway obstruction, moderate restriction ratio 74, FEV1 55%, FVC 53%, 12% bronchodilator response, FEV1 improved to 62%, DLCO 81%    12/2016 PFT >> ratio 84, severe restriction with reduced diffusion capacity.  Total lung capacity 72% FVC, 1.6 L / 40% and DLCO of 19.4/68%.   PFT 1/ 2019 showed FEV1 45%, ratio 68, FVC 48% consistent with moderate to severe airflow obstruction   Feno 01/26/2017 ->  8ppb     High resolution CT chest 01/2017 neg for ILD   CT angiogram chest 05/2020 clear lungs CT sinus March 09, 2017 clear  sinuse  Review of Systems neg for any significant sore throat, dysphagia, itching, sneezing, nasal congestion or excess/ purulent secretions, fever, chills, sweats, unintended wt loss, pleuritic or exertional cp, hempoptysis, orthopnea pnd or change in chronic leg swelling. Also denies presyncope, palpitations, heartburn, abdominal pain, nausea, vomiting, diarrhea or change in bowel or urinary habits, dysuria,hematuria, rash, arthralgias, visual complaints, headache, numbness weakness or ataxia.     Objective:   Physical Exam  Gen. Pleasant, obese, in no distress ENT - no lesions, no post nasal drip Neck: No JVD, no thyromegaly, no carotid bruits Lungs: no use of accessory muscles, no dullness to percussion, decreased without rales or rhonchi  Cardiovascular: Rhythm regular, heart sounds  normal, no murmurs or gallops, no peripheral edema Musculoskeletal: No deformities, no cyanosis or clubbing , no tremors       Assessment & Plan:     OSA -on checking his amplitude, it seems his sensation level has decreased to 1.3 V, we changed his range to 1.4 to 2.4 V and back to his amplitude down to 1.7 V He will continue to increase every 1 to 2 weeks as tolerated. We will do a prestudy check-in in 4 weeks and plan for a titration study in 2 months  Hypoglossal nerve stimulator was reassessed today.  Goal of the follow-up visit was to  ensure good compliance, good subjective benefit, good tongue motion and good sense lead waveforms .incision sites appear good, tongue protrusion was examined.  Download was reviewed and usage appears to be up to 8 hours average per night.  Spouse reports that snoring is decreased and she has more energy.  Programming :  1.  Stimulation level : Sensation 1.3 V , functional level 1.4 V lower limit, upper limit 2.4 V 2.  Sleep remote education was provided patient demonstrated competency with the remote and was aware of patient Instruction videos and sleep remote  guide 5.  Patient was instructed to step up levels by 1 level (0.1 V ) every week

## 2023-02-22 NOTE — Patient Instructions (Signed)
 We backed you down  to level 4= 1.7 V Increase by 1 level every week until discomfort

## 2023-03-14 NOTE — Patient Outreach (Signed)
  Care Coordination   Follow Up Visit Note   03/14/2023 Name: Kenneth Fuller MRN: [id number] DOB: 1945-10-30  Kenneth Fuller is a 78 y.o. year old male who sees [name], Fonda LABOR, MD for primary care. Letter Out entered by LCSW entered in error .  No letter out by KEN Glendia Pear  What matters to the patients health and wellness today?     Goals Addressed   None     SDOH assessments and interventions completed:  No     Care Coordination Interventions:  No, not indicated   Follow up plan: no follow up needed by LCSW   Encounter Outcome:  Patient Visit Completed    Glendia Pear  MSW, LCSW [facility] Licensed Clinical Social Worker Direct Dial:  [phone] Fax:  [phone] Website:  [email or web address]

## 2023-03-25 ENCOUNTER — Encounter (HOSPITAL_BASED_OUTPATIENT_CLINIC_OR_DEPARTMENT_OTHER): Payer: Self-pay | Admitting: Pulmonary Disease

## 2023-03-25 ENCOUNTER — Ambulatory Visit (HOSPITAL_BASED_OUTPATIENT_CLINIC_OR_DEPARTMENT_OTHER): Payer: PPO | Admitting: Pulmonary Disease

## 2023-03-25 VITALS — BP 122/80 | HR 79 | Ht 67.0 in | Wt 228.7 lb

## 2023-03-25 DIAGNOSIS — G4733 Obstructive sleep apnea (adult) (pediatric): Secondary | ICD-10-CM

## 2023-03-25 NOTE — Progress Notes (Signed)
Subjective:    Patient ID: Kenneth Fuller, male    DOB: 01-Jul-1945, 78 y.o.   MRN: [id number]  HPI   78 yo remote smoker for FU of asthma/ COPD and OSA    -Doubt he has significant COPD.  Variability in lung function seems to suggest asthma. Feels daliresp really helped him subjectively    PMH - chronic diastolic heart failure coronary artery disease diabetes, stage3 CKD -asthma as a child , failed medical exam , recurred 2018          He smoked less than 10 pack years before he quit in 1971.  He reports lifelong history of asthma.  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    He was unable to tolerate CPAP. Underwent implantation of hypoglossal nerve stimulator device on 10/26/22   Activation 01/17/23 delayed  Sensation 1.6 V , functional level 1.7 V lower limit, upper limit 2.7 V   Start delay 30 minutes, pause time 15 minutes, duration 8 hours   02/2023 OV  could not tolerate 2.0 V and  backed down to 1.8 V  his sensation level  decreased to 1.3 V, we changed his range to 1.4 to 2.4 V and reduced his amplitude down to 1.7 V   1 month follow-up visit. He is tolerating device very well. Very compliant 8 hours per night.  Very few pauses on review of download. He remains at 1.7 V He is feeling rested when he wakes up, is sleeping through the night, denies daytime somnolence or fatigue   Significant tests/ events reviewed NPSG 06/2020 >> BiPAP 15/11 ,med FF mask 11/2015 NPSG  very severe OSA, AHI >100. Split night study showed optimal control with CPAP 5-20     PFTs 01/2021 no airway obstruction, moderate restriction ratio 74, FEV1 55%, FVC 53%, 12% bronchodilator response, FEV1 improved to 62%, DLCO 81%    12/2016 PFT >> ratio 84, severe restriction with reduced diffusion capacity.  Total lung capacity 72% FVC, 1.6 L / 40% and DLCO of 19.4/68%.   PFT 1/ 2019 showed FEV1 45%, ratio 68, FVC 48% consistent with moderate to  severe airflow obstruction   Feno 01/26/2017 ->  8ppb     High resolution CT chest 01/2017 neg for ILD   CT angiogram chest 05/2020 clear lungs CT sinus March 09, 2017 clear sinuse   Review of Systems neg for any significant sore throat, dysphagia, itching, sneezing, nasal congestion or excess/ purulent secretions, fever, chills, sweats, unintended wt loss, pleuritic or exertional cp, hempoptysis, orthopnea pnd or change in chronic leg swelling. Also denies presyncope, palpitations, heartburn, abdominal pain, nausea, vomiting, diarrhea or change in bowel or urinary habits, dysuria,hematuria, rash, arthralgias, visual complaints, headache, numbness weakness or ataxia.     Objective:   Physical Exam  Gen. Pleasant, obese, in no distress ENT - no lesions, no post nasal drip Neck: No JVD, no thyromegaly, no carotid bruits Lungs: no use of accessory muscles, no dullness to percussion, decreased without rales or rhonchi  Cardiovascular: Rhythm regular, heart sounds  normal, no murmurs or gallops, no peripheral edema Musculoskeletal: No deformities, no cyanosis or clubbing , no tremors       Assessment & Plan:    OSA  -this was a readiness check prior to sleep study.  His symptoms appear improved, voltage of 1.7 V.  We tested him on 1.8 and if he is able to tolerate  he will try to increase to that level. Will plan for inspire titration study  His compliance is very good and he is using the device every night and all night

## 2023-03-25 NOTE — Patient Instructions (Addendum)
Schedule sleep study Try to increase by 1 level tonight

## 2023-03-28 ENCOUNTER — Encounter: Payer: Self-pay | Admitting: Family Medicine

## 2023-03-28 ENCOUNTER — Ambulatory Visit (INDEPENDENT_AMBULATORY_CARE_PROVIDER_SITE_OTHER): Payer: PPO | Admitting: Family Medicine

## 2023-03-28 VITALS — BP 122/63 | HR 85 | Ht 67.0 in | Wt 230.0 lb

## 2023-03-28 DIAGNOSIS — I10 Essential (primary) hypertension: Secondary | ICD-10-CM | POA: Diagnosis not present

## 2023-03-28 DIAGNOSIS — F339 Major depressive disorder, recurrent, unspecified: Secondary | ICD-10-CM | POA: Diagnosis not present

## 2023-03-28 DIAGNOSIS — N1832 Chronic kidney disease, stage 3b: Secondary | ICD-10-CM | POA: Diagnosis not present

## 2023-03-28 DIAGNOSIS — E1122 Type 2 diabetes mellitus with diabetic chronic kidney disease: Secondary | ICD-10-CM | POA: Diagnosis not present

## 2023-03-28 DIAGNOSIS — E1142 Type 2 diabetes mellitus with diabetic polyneuropathy: Secondary | ICD-10-CM | POA: Diagnosis not present

## 2023-03-28 DIAGNOSIS — E1169 Type 2 diabetes mellitus with other specified complication: Secondary | ICD-10-CM | POA: Diagnosis not present

## 2023-03-28 DIAGNOSIS — I5032 Chronic diastolic (congestive) heart failure: Secondary | ICD-10-CM

## 2023-03-28 DIAGNOSIS — J9611 Chronic respiratory failure with hypoxia: Secondary | ICD-10-CM | POA: Diagnosis not present

## 2023-03-28 DIAGNOSIS — E119 Type 2 diabetes mellitus without complications: Secondary | ICD-10-CM

## 2023-03-28 DIAGNOSIS — E785 Hyperlipidemia, unspecified: Secondary | ICD-10-CM

## 2023-03-28 DIAGNOSIS — E1159 Type 2 diabetes mellitus with other circulatory complications: Secondary | ICD-10-CM | POA: Diagnosis not present

## 2023-03-28 DIAGNOSIS — I130 Hypertensive heart and chronic kidney disease with heart failure and stage 1 through stage 4 chronic kidney disease, or unspecified chronic kidney disease: Secondary | ICD-10-CM

## 2023-03-28 DIAGNOSIS — I5042 Chronic combined systolic (congestive) and diastolic (congestive) heart failure: Secondary | ICD-10-CM | POA: Diagnosis not present

## 2023-03-28 DIAGNOSIS — Z7985 Long-term (current) use of injectable non-insulin antidiabetic drugs: Secondary | ICD-10-CM

## 2023-03-28 LAB — BAYER DCA HB A1C WAIVED: HB A1C (BAYER DCA - WAIVED): 5.2 % (ref 4.8–5.6)

## 2023-03-28 MED ORDER — SERTRALINE HCL 100 MG PO TABS
100.0000 mg | ORAL_TABLET | Freq: Every day | ORAL | 3 refills | Status: AC
Start: 2023-03-28 — End: ?

## 2023-03-28 MED ORDER — BISOPROLOL FUMARATE 5 MG PO TABS: ORAL_TABLET | ORAL | 3 refills | Status: AC

## 2023-03-28 MED ORDER — ROFLUMILAST 500 MCG PO TABS: 500.0000 ug | ORAL_TABLET | Freq: Every day | ORAL | 3 refills | Status: AC

## 2023-03-28 NOTE — Progress Notes (Signed)
BP 122/63   Pulse 85   Ht 5\' 7"  (1.702 m)   Wt 230 lb (104.3 kg)   SpO2 95%   BMI 36.02 kg/m    Subjective:   Patient ID: Kenneth Fuller, male    DOB: 1945-06-28, 78 y.o.   MRN: [id number]  HPI: CHIEF WALKUP is a 78 y.o. male presenting on 03/28/2023 for Medical Management of Chronic Issues, Diabetes, Hyperlipidemia, Hypertension, and Chronic Kidney Disease   HPI Type 2 diabetes mellitus Patient comes in today for recheck of his diabetes. Patient has been currently taking Ozempic and [place]. Patient is not currently on an ACE inhibitor/ARB. Patient has not seen an ophthalmologist this year. Patient denies any new issues with their feet. The symptom started onset as an adult hypertension and CHF and hyperlipidemia ARE RELATED TO DM   Hypertension and CHF with hypoxia, sees cardiology Patient is currently on bisoprolol and furosemide and hydralazine and isosorbide, and their blood pressure today is 122/63. Patient denies any lightheadedness or dizziness. Patient denies headaches, blurred vision, chest pains, shortness of breath, or weakness. Denies any side effects from medication and is content with current medication.   Hyperlipidemia Patient is coming in for recheck of his hyperlipidemia. The patient is currently taking atorvastatin. They deny any issues with myalgias or history of liver damage from it. They deny any focal numbness or weakness or chest pain.   Depression recheck Patient is coming in today for depression recheck.  He currently takes Zoloft 100 mg daily.  He feels like he does well on it.  Denies any major issues.:    03/28/2023   10:41 AM 12/23/2022   10:58 AM 12/16/2022   10:04 AM 05/31/2022   11:32 AM 05/31/2022   11:31 AM  Depression screen PHQ 2/9  Decreased Interest 1 0 0  0  Down, Depressed, Hopeless 1 0 0  0  PHQ - 2 Score 2 0 0  0  Altered sleeping 1 1  2    Tired, decreased energy 2 0  2   Change in appetite 1 0  0   Feeling bad or failure  about yourself  1 0  0   Trouble concentrating 1 1  1    Moving slowly or fidgety/restless 0 0  0   Suicidal thoughts 0 0  0   PHQ-9 Score 8 2     Difficult doing work/chores Somewhat difficult Not difficult at all  Somewhat difficult      Relevant past medical, surgical, family and social history reviewed and updated as indicated. Interim medical history since our last visit reviewed. Allergies and medications reviewed and updated.  Review of Systems  Constitutional:  Negative for chills and fever.  Eyes:  Negative for visual disturbance.  Respiratory:  Negative for shortness of breath and wheezing.   Cardiovascular:  Negative for chest pain and leg swelling.  Musculoskeletal:  Negative for back pain and gait problem.  Skin:  Negative for rash.  Neurological:  Negative for dizziness, weakness and light-headedness.  All other systems reviewed and are negative.   Per HPI unless specifically indicated above   Allergies as of 03/28/2023       Reactions   Amlodipine Besy-benazepril Hcl Swelling, Other (See Comments)   Makes tongue swell (lotrel)   Hydrocodone Itching   Can tolerate in low doses   Oxycodone Itching   Phenergan [promethazine Hcl] Other (See Comments)   "I can't remember."        Medication List  Accurate as of March 28, 2023 11:10 AM. If you have any questions, ask your nurse or doctor.          albuterol 108 (90 Base) MCG/ACT inhaler Commonly known as: VENTOLIN HFA Inhale 2 puffs into the lungs every 6 (six) hours as needed for wheezing or shortness of breath.   albuterol (2.5 MG/3ML) 0.083% nebulizer solution Commonly known as: PROVENTIL Take 3 mLs (2.5 mg total) by nebulization every 6 (six) hours as needed for wheezing or shortness of breath.   atorvastatin 40 MG tablet Commonly known as: LIPITOR Take 40 mg by mouth daily.   bisoprolol 5 MG tablet Commonly known as: ZEBETA Take 1/2 (one-half) tablet by mouth once daily    cetirizine 10 MG tablet Commonly known as: ZYRTEC Take 1 tablet (10 mg total) by mouth daily.   dapagliflozin propanediol 10 MG Tabs tablet Commonly known as: Farxiga Take 1 tablet (10 mg total) by mouth daily.   fluticasone 50 MCG/ACT nasal spray Commonly known as: FLONASE Place 2 sprays into both nostrils daily. What changed:  when to take this reasons to take this   furosemide 40 MG tablet Commonly known as: LASIX Take 40 mg by mouth daily as needed (fluid retention.).   Gemtesa 75 MG Tabs Generic drug: Vibegron Take 75 mg by mouth daily.   hydrALAZINE 50 MG tablet Commonly known as: APRESOLINE Take 1.5 tablets (75 mg total) by mouth 3 (three) times daily.   HYDROcodone-acetaminophen 5-325 MG tablet Commonly known as: NORCO/VICODIN Take 1-2 tablets by mouth every 4 (four) hours as needed for severe pain.   HYDROcodone-acetaminophen 5-325 MG tablet Commonly known as: NORCO/VICODIN Take 1-2 tablets by mouth every 6 (six) hours as needed for moderate pain.   isosorbide dinitrate 20 MG tablet Commonly known as: ISORDIL Take 1 tablet (20 mg total) by mouth 3 (three) times daily.   nitroGLYCERIN 0.4 MG SL tablet Commonly known as: NITROSTAT Place 0.4 mg under the tongue every 5 (five) minutes as needed for chest pain.   omeprazole 40 MG capsule Commonly known as: PRILOSEC TAKE 1 CAPSULE BY MOUTH ONCE DAILY 30 TO 60 MINUTES BEFORE BREAKFAST   OneTouch Delica Plus Lancet33G Misc USE 1 LANCET TO CHECK GLUCOSE ONCE DAILY AS DIRECTED   OneTouch Verio Flex System w/Device Kit Use to test blood sugar daily as directed. DX: E11.65   OneTouch Verio test strip Generic drug: glucose blood test blood sugar daily as directed. DX: E11.65   OVER THE COUNTER MEDICATION Take 3 capsules by mouth daily. Super Beets   Ozempic (0.25 or 0.5 MG/DOSE) 2 MG/3ML Sopn Generic drug: Semaglutide(0.25 or 0.5MG /DOS) Inject 0.5 mg into the skin once a week.   roflumilast 500 MCG Tabs  tablet Commonly known as: DALIRESP Take 1 tablet (500 mcg total) by mouth daily.   sertraline 100 MG tablet Commonly known as: ZOLOFT Take 1 tablet (100 mg total) by mouth daily.   tamsulosin 0.4 MG Caps capsule Commonly known as: FLOMAX Take 1 capsule (0.4 mg total) by mouth daily after supper.   Vitamin D3 125 MCG (5000 UT) Tabs Take 5,000 Units by mouth daily.         Objective:   BP 122/63   Pulse 85   Ht 5\' 7"  (1.702 m)   Wt 230 lb (104.3 kg)   SpO2 95%   BMI 36.02 kg/m   Wt Readings from Last 3 Encounters:  03/28/23 230 lb (104.3 kg)  03/25/23 228 lb 11.2 oz (103.7 kg)  02/22/23 232 lb 9.6 oz (105.5 kg)    Physical Exam Vitals and nursing note reviewed.  Constitutional:      General: He is not in acute distress.    Appearance: He is well-developed. He is not diaphoretic.  Eyes:     General: No scleral icterus.    Conjunctiva/sclera: Conjunctivae normal.  Neck:     Thyroid: No thyromegaly.  Cardiovascular:     Rate and Rhythm: Normal rate and regular rhythm.     Heart sounds: Normal heart sounds. No murmur heard. Pulmonary:     Effort: Pulmonary effort is normal. No respiratory distress.     Breath sounds: Normal breath sounds. No wheezing.  Musculoskeletal:        General: Normal range of motion.     Cervical back: Neck supple.  Lymphadenopathy:     Cervical: No cervical adenopathy.  Skin:    General: Skin is warm and dry.     Findings: No rash.  Neurological:     Mental Status: He is alert and oriented to person, place, and time.     Coordination: Coordination normal.  Psychiatric:        Behavior: Behavior normal.       Assessment & Plan:   Problem List Items Addressed This Visit       Cardiovascular and Mediastinum   Essential hypertension   Relevant Medications   bisoprolol (ZEBETA) 5 MG tablet   Other Relevant Orders   CBC with Differential/Platelet   CMP14+EGFR   Lipid panel   Bayer DCA Hb A1c Waived   CHF (congestive heart  failure) (HCC)   Relevant Medications   bisoprolol (ZEBETA) 5 MG tablet     Respiratory   Chronic respiratory failure with hypoxia (HCC)   Relevant Medications   roflumilast (DALIRESP) 500 MCG TABS tablet     Endocrine   Diabetes mellitus type II, non insulin dependent (HCC)   Hyperlipidemia associated with type 2 diabetes mellitus (HCC)   Relevant Medications   bisoprolol (ZEBETA) 5 MG tablet   Other Relevant Orders   CBC with Differential/Platelet   CMP14+EGFR   Lipid panel   Bayer DCA Hb A1c Waived     Genitourinary   CKD (chronic kidney disease), stage III (HCC) - Primary   Relevant Orders   CBC with Differential/Platelet   CMP14+EGFR   Lipid panel   Bayer DCA Hb A1c Waived     Other   Depression, recurrent (HCC)   Relevant Medications   sertraline (ZOLOFT) 100 MG tablet   Other Visit Diagnoses       Type 2 diabetes mellitus with diabetic polyneuropathy, without long-term current use of insulin (HCC)       Relevant Medications   sertraline (ZOLOFT) 100 MG tablet   Other Relevant Orders   CBC with Differential/Platelet   CMP14+EGFR   Lipid panel   Bayer DCA Hb A1c Waived       Patient had a little congestion and a cold last week and the week before but he says is getting better and is using Mucinex.  Continue current medicine, blood pressure and everything else looks good.  A1c was 5.2.  No changes Follow up plan: Return in about 3 months (around 06/25/2023), or if symptoms worsen or fail to improve, for Diabetes hypertension and hyperlipidemia.  Counseling provided for all of the vaccine components Orders Placed This Encounter  Procedures   CBC with Differential/Platelet   CMP14+EGFR   Lipid panel   Bayer DCA Hb  A1c Waived    Arville Care, MD [facility] 03/28/2023, 11:10 AM

## 2023-03-29 LAB — CMP14+EGFR
ALT: 20 [IU]/L (ref 0–44)
AST: 20 [IU]/L (ref 0–40)
Albumin: 4.2 g/dL (ref 3.8–4.8)
Alkaline Phosphatase: 100 [IU]/L (ref 44–121)
BUN/Creatinine Ratio: 21 (ref 10–24)
BUN: 24 mg/dL (ref 8–27)
Bilirubin Total: 0.4 mg/dL (ref 0.0–1.2)
CO2: 23 mmol/L (ref 20–29)
Calcium: 9 mg/dL (ref 8.6–10.2)
Chloride: 103 mmol/L (ref 96–106)
Creatinine, Ser: 1.16 mg/dL (ref 0.76–1.27)
Globulin, Total: 2.2 g/dL (ref 1.5–4.5)
Glucose: 132 mg/dL — ABNORMAL HIGH (ref 70–99)
Potassium: 4.3 mmol/L (ref 3.5–5.2)
Sodium: 141 mmol/L (ref 134–144)
Total Protein: 6.4 g/dL (ref 6.0–8.5)
eGFR: 65 mL/min/{1.73_m2} (ref >59)

## 2023-03-29 LAB — CBC WITH DIFFERENTIAL/PLATELET
Basophils Absolute: 0 10*3/uL (ref 0.0–0.2)
Basos: 1 %
EOS (ABSOLUTE): 0.1 10*3/uL (ref 0.0–0.4)
Eos: 2 %
Hematocrit: 47.4 % (ref 37.5–51.0)
Hemoglobin: 15.2 g/dL (ref 13.0–17.7)
Immature Grans (Abs): 0 10*3/uL (ref 0.0–0.1)
Immature Granulocytes: 0 %
Lymphocytes Absolute: 2.4 10*3/uL (ref 0.7–3.1)
Lymphs: 31 %
MCH: 26.7 pg (ref 26.6–33.0)
MCHC: 32.1 g/dL (ref 31.5–35.7)
MCV: 83 fL (ref 79–97)
Monocytes Absolute: 0.5 10*3/uL (ref 0.1–0.9)
Monocytes: 7 %
Neutrophils Absolute: 4.6 10*3/uL (ref 1.4–7.0)
Neutrophils: 59 %
Platelets: 199 10*3/uL (ref 150–450)
RBC: 5.7 x10E6/uL (ref 4.14–5.80)
RDW: 15.7 % — ABNORMAL HIGH (ref 11.6–15.4)
WBC: 7.7 10*3/uL (ref 3.4–10.8)

## 2023-03-29 LAB — LIPID PANEL
Chol/HDL Ratio: 3.5 {ratio} (ref 0.0–5.0)
Cholesterol, Total: 138 mg/dL (ref 100–199)
HDL: 40 mg/dL (ref >39)
LDL Chol Calc (NIH): 72 mg/dL (ref 0–99)
Triglycerides: 152 mg/dL — ABNORMAL HIGH (ref 0–149)
VLDL Cholesterol Cal: 26 mg/dL (ref 5–40)

## 2023-04-01 ENCOUNTER — Encounter: Payer: Self-pay | Admitting: Family Medicine

## 2023-04-06 ENCOUNTER — Other Ambulatory Visit: Payer: PPO

## 2023-04-06 DIAGNOSIS — I5042 Chronic combined systolic (congestive) and diastolic (congestive) heart failure: Secondary | ICD-10-CM | POA: Diagnosis not present

## 2023-04-06 DIAGNOSIS — N189 Chronic kidney disease, unspecified: Secondary | ICD-10-CM | POA: Diagnosis not present

## 2023-04-06 DIAGNOSIS — E119 Type 2 diabetes mellitus without complications: Secondary | ICD-10-CM | POA: Diagnosis not present

## 2023-04-06 DIAGNOSIS — E559 Vitamin D deficiency, unspecified: Secondary | ICD-10-CM | POA: Diagnosis not present

## 2023-04-06 DIAGNOSIS — E1122 Type 2 diabetes mellitus with diabetic chronic kidney disease: Secondary | ICD-10-CM | POA: Diagnosis not present

## 2023-04-06 DIAGNOSIS — I10 Essential (primary) hypertension: Secondary | ICD-10-CM | POA: Diagnosis not present

## 2023-04-06 DIAGNOSIS — N182 Chronic kidney disease, stage 2 (mild): Secondary | ICD-10-CM | POA: Diagnosis not present

## 2023-04-15 DIAGNOSIS — E1122 Type 2 diabetes mellitus with diabetic chronic kidney disease: Secondary | ICD-10-CM | POA: Diagnosis not present

## 2023-04-15 DIAGNOSIS — I5042 Chronic combined systolic (congestive) and diastolic (congestive) heart failure: Secondary | ICD-10-CM | POA: Diagnosis not present

## 2023-04-15 DIAGNOSIS — N1831 Chronic kidney disease, stage 3a: Secondary | ICD-10-CM | POA: Diagnosis not present

## 2023-04-15 DIAGNOSIS — I129 Hypertensive chronic kidney disease with stage 1 through stage 4 chronic kidney disease, or unspecified chronic kidney disease: Secondary | ICD-10-CM | POA: Diagnosis not present

## 2023-05-26 DIAGNOSIS — N3942 Incontinence without sensory awareness: Secondary | ICD-10-CM | POA: Diagnosis not present

## 2023-06-02 ENCOUNTER — Encounter: Payer: Self-pay | Admitting: Family Medicine

## 2023-06-02 ENCOUNTER — Ambulatory Visit: Admitting: Family Medicine

## 2023-06-02 VITALS — BP 152/88 | HR 72 | Temp 98.2°F | Ht 67.0 in | Wt 233.0 lb

## 2023-06-02 DIAGNOSIS — J441 Chronic obstructive pulmonary disease with (acute) exacerbation: Secondary | ICD-10-CM | POA: Diagnosis not present

## 2023-06-02 MED ORDER — AMOXICILLIN-POT CLAVULANATE 875-125 MG PO TABS
1.0000 | ORAL_TABLET | Freq: Two times a day (BID) | ORAL | 0 refills | Status: DC
Start: 2023-06-02 — End: 2023-07-07

## 2023-06-02 MED ORDER — PREDNISONE 20 MG PO TABS
ORAL_TABLET | ORAL | 0 refills | Status: DC
Start: 2023-06-02 — End: 2023-07-07

## 2023-06-02 NOTE — Progress Notes (Signed)
 BP (!) 152/88   Pulse 72   Temp 98.2 F (36.8 C)   Ht 5\' 7"  (1.702 m)   Wt 233 lb (105.7 kg)   SpO2 96%   BMI 36.49 kg/m    Subjective:   Patient ID: Kenneth Fuller, male    DOB: 08/07/1945, 78 y.o.   MRN: [id number]  HPI: Kenneth Fuller is a 78 y.o. male presenting on 06/02/2023 for URI   HPI Cough and congestion and wheezing and sinus pressure. Patient is coming today with cough and congestion and sinus pressure.  Symptoms started last week, intermittent going on for a little more than 7 days and it started out with allergies but now just worsening and now it is getting down into his chest.  He has been using nasal lavage and his inhalers and using other medicines to try and help with the respiratory symptoms and the nasal symptoms but now its worsened and started getting into his chest causing wheezing and congestion and a little shortness of breath on exertion.  He denies any fevers or chills.  He did have a grandbaby that he was around last week that had been sick with a little cold or allergies but he did not know which.  Relevant past medical, surgical, family and social history reviewed and updated as indicated. Interim medical history since our last visit reviewed. Allergies and medications reviewed and updated.  Review of Systems  Constitutional:  Negative for chills and fever.  HENT:  Positive for congestion, sinus pressure and sneezing.   Eyes:  Negative for discharge.  Respiratory:  Positive for cough, shortness of breath and wheezing.   Cardiovascular:  Negative for chest pain and leg swelling.  Musculoskeletal:  Negative for back pain and gait problem.  Skin:  Negative for rash.  All other systems reviewed and are negative.   Per HPI unless specifically indicated above   Allergies as of 06/02/2023       Reactions   Amlodipine  Besy-benazepril Hcl Swelling, Other (See Comments)   Makes tongue swell (lotrel)   Hydrocodone  Itching   Can tolerate in low  doses   Oxycodone  Itching   Phenergan  [promethazine  Hcl] Other (See Comments)   "I can't remember."        Medication List        Accurate as of June 02, 2023  1:55 PM. If you have any questions, ask your nurse or doctor.          albuterol  108 (90 Base) MCG/ACT inhaler Commonly known as: VENTOLIN  HFA Inhale 2 puffs into the lungs every 6 (six) hours as needed for wheezing or shortness of breath.   albuterol  (2.5 MG/3ML) 0.083% nebulizer solution Commonly known as: PROVENTIL  Take 3 mLs (2.5 mg total) by nebulization every 6 (six) hours as needed for wheezing or shortness of breath.   amoxicillin -clavulanate 875-125 MG tablet Commonly known as: AUGMENTIN  Take 1 tablet by mouth 2 (two) times daily. Started by: Lucio Sabin Dorothea Yow   atorvastatin  40 MG tablet Commonly known as: LIPITOR Take 40 mg by mouth daily.   bisoprolol  5 MG tablet Commonly known as: ZEBETA  Take 1/2 (one-half) tablet by mouth once daily   cetirizine  10 MG tablet Commonly known as: ZYRTEC  Take 1 tablet (10 mg total) by mouth daily.   dapagliflozin  propanediol 10 MG Tabs tablet Commonly known as: Farxiga  Take 1 tablet (10 mg total) by mouth daily.   fluticasone  50 MCG/ACT nasal spray Commonly known as: FLONASE  Place 2 sprays  into both nostrils daily. What changed:  when to take this reasons to take this   furosemide  40 MG tablet Commonly known as: LASIX  Take 40 mg by mouth daily as needed (fluid retention.).   Gemtesa 75 MG Tabs Generic drug: Vibegron Take 75 mg by mouth daily.   hydrALAZINE  50 MG tablet Commonly known as: APRESOLINE  Take 1.5 tablets (75 mg total) by mouth 3 (three) times daily.   HYDROcodone -acetaminophen  5-325 MG tablet Commonly known as: NORCO/VICODIN Take 1-2 tablets by mouth every 4 (four) hours as needed for severe pain.   HYDROcodone -acetaminophen  5-325 MG tablet Commonly known as: NORCO/VICODIN Take 1-2 tablets by mouth every 6 (six) hours as needed for  moderate pain.   isosorbide  dinitrate 20 MG tablet Commonly known as: ISORDIL  Take 1 tablet (20 mg total) by mouth 3 (three) times daily.   nitroGLYCERIN  0.4 MG SL tablet Commonly known as: NITROSTAT  Place 0.4 mg under the tongue every 5 (five) minutes as needed for chest pain.   omeprazole  40 MG capsule Commonly known as: PRILOSEC TAKE 1 CAPSULE BY MOUTH ONCE DAILY 30 TO 60 MINUTES BEFORE BREAKFAST   OneTouch Delica Plus Lancet33G Misc USE 1 LANCET TO CHECK GLUCOSE ONCE DAILY AS DIRECTED   OneTouch Verio Flex System w/Device Kit Use to test blood sugar daily as directed. DX: E11.65   OneTouch Verio test strip Generic drug: glucose blood test blood sugar daily as directed. DX: E11.65   OVER THE COUNTER MEDICATION Take 3 capsules by mouth daily. Super Beets   Ozempic  (0.25 or 0.5 MG/DOSE) 2 MG/3ML Sopn Generic drug: Semaglutide (0.25 or 0.5MG /DOS) Inject 0.5 mg into the skin once a week.   predniSONE  20 MG tablet Commonly known as: DELTASONE  2 po at same time daily for 5 days Started by: Lucio Sabin Chevi Lim   roflumilast  500 MCG Tabs tablet Commonly known as: DALIRESP  Take 1 tablet (500 mcg total) by mouth daily.   sertraline  100 MG tablet Commonly known as: ZOLOFT  Take 1 tablet (100 mg total) by mouth daily.   tamsulosin  0.4 MG Caps capsule Commonly known as: FLOMAX  Take 1 capsule (0.4 mg total) by mouth daily after supper.   Vitamin D3 125 MCG (5000 UT) Tabs Take 5,000 Units by mouth daily.         Objective:   BP (!) 152/88   Pulse 72   Temp 98.2 F (36.8 C)   Ht 5\' 7"  (1.702 m)   Wt 233 lb (105.7 kg)   SpO2 96%   BMI 36.49 kg/m   Wt Readings from Last 3 Encounters:  06/02/23 233 lb (105.7 kg)  03/28/23 230 lb (104.3 kg)  03/25/23 228 lb 11.2 oz (103.7 kg)    Physical Exam Vitals and nursing note reviewed.  Constitutional:      General: He is not in acute distress.    Appearance: He is well-developed. He is not diaphoretic.  Eyes:      General: No scleral icterus.    Conjunctiva/sclera: Conjunctivae normal.  Neck:     Thyroid : No thyromegaly.  Cardiovascular:     Rate and Rhythm: Normal rate and regular rhythm.     Heart sounds: Normal heart sounds. No murmur heard. Pulmonary:     Effort: Pulmonary effort is normal. No respiratory distress.     Breath sounds: Wheezing present. No rhonchi or rales.  Chest:     Chest wall: No tenderness.  Musculoskeletal:        General: Normal range of motion.  Cervical back: Neck supple.  Lymphadenopathy:     Cervical: No cervical adenopathy.  Skin:    General: Skin is warm and dry.     Findings: No rash.  Neurological:     Mental Status: He is alert and oriented to person, place, and time.     Coordination: Coordination normal.  Psychiatric:        Behavior: Behavior normal.       Assessment & Plan:   Problem List Items Addressed This Visit   None Visit Diagnoses       COPD exacerbation (HCC)    -  Primary   Relevant Medications   amoxicillin -clavulanate (AUGMENTIN ) 875-125 MG tablet   predniSONE  (DELTASONE ) 20 MG tablet     Recommended that he continue with the nasal lavage and his inhalers, will send to ENT for prednisone  and amoxicillin  and treat like COPD exacerbation.  He does not appear to be in fluid overload at this point.  Follow up plan: Return if symptoms worsen or fail to improve.  Counseling provided for all of the vaccine components No orders of the defined types were placed in this encounter.   Jolyne Needs, MD [facility] 06/02/2023, 1:55 PM

## 2023-06-09 ENCOUNTER — Ambulatory Visit (HOSPITAL_BASED_OUTPATIENT_CLINIC_OR_DEPARTMENT_OTHER): Attending: Pulmonary Disease | Admitting: Pulmonary Disease

## 2023-06-09 DIAGNOSIS — G4733 Obstructive sleep apnea (adult) (pediatric): Secondary | ICD-10-CM | POA: Diagnosis not present

## 2023-06-14 ENCOUNTER — Ambulatory Visit: Payer: Self-pay

## 2023-06-14 NOTE — Telephone Encounter (Signed)
 Copied from CRM #[id number]. Topic: Clinical - Red Word Triage >> Jun 14, 2023 10:47 AM Hassie Lint wrote: Red Word that prompted transfer to Nurse Triage: Patient was seen last week at office, has taken medication that was prescribed but is still experiencing tightness in his chest and a cough with discolored mucous.  Chief Complaint: cough with yellow sputum, chest tightness, wheezing Symptoms: see above Frequency: for 1 weeks Pertinent Negatives: Patient denies fever, cp Disposition: [] ED /[] Urgent Care (no appt availability in office) / [x] Appointment(In office/virtual)/ []  [facility]/ [] Home Care/ [] Refused Recommended Disposition /[] [place] [facility]/ []  Follow-up with PCP Additional Notes: per protocol apt made for tomorrow; care advice given, denies questions; instructed to go to ER if becomes worse.    Reason for Disposition  [1] MILD difficulty breathing (e.g., minimal/no SOB at rest, SOB with walking, pulse <100) AND [2] still present when not coughing  Answer Assessment - Initial Assessment Questions 1. ONSET: "When did the cough begin?"      2 weeks ago 2. SEVERITY: "How bad is the cough today?"      moderate 3. SPUTUM: "Describe the color of your sputum" (none, dry cough; clear, white, yellow, green)     Yellow at times 4. HEMOPTYSIS: "Are you coughing up any blood?" If so ask: "How much?" (flecks, streaks, tablespoons, etc.)     denies 5. DIFFICULTY BREATHING: "Are you having difficulty breathing?" If Yes, ask: "How bad is it?" (e.g., mild, moderate, severe)    - MILD: No SOB at rest, mild SOB with walking, speaks normally in sentences, can lie down, no retractions, pulse < 100.    - MODERATE: SOB at rest, SOB with minimal exertion and prefers to sit, cannot lie down flat, speaks in phrases, mild retractions, audible wheezing, pulse 100-120.    - SEVERE: Very SOB at rest, speaks in single words, struggling to breathe, sitting hunched forward, retractions, pulse  > 120      States chest feels tight, states sometimes pain in back chest area 6. FEVER: "Do you have a fever?" If Yes, ask: "What is your temperature, how was it measured, and when did it start?"     no 7. CARDIAC HISTORY: "Do you have any history of heart disease?" (e.g., heart attack, congestive heart failure)      chf 8. LUNG HISTORY: "Do you have any history of lung disease?"  (e.g., pulmonary embolus, asthma, emphysema)     denies 9. PE RISK FACTORS: "Do you have a history of blood clots?" (or: recent major surgery, recent prolonged travel, bedridden)     denies 10. OTHER SYMPTOMS: "Do you have any other symptoms?" (e.g., runny nose, wheezing, chest pain)       wheezing 11. PREGNANCY: "Is there any chance you are pregnant?" "When was your last menstrual period?"       na 12. TRAVEL: "Have you traveled out of the country in the last month?" (e.g., travel history, exposures)       no  Protocols used: Cough - Acute Productive-A-AH

## 2023-06-15 ENCOUNTER — Ambulatory Visit: Admitting: Family Medicine

## 2023-06-15 ENCOUNTER — Ambulatory Visit (INDEPENDENT_AMBULATORY_CARE_PROVIDER_SITE_OTHER)

## 2023-06-15 ENCOUNTER — Encounter: Payer: Self-pay | Admitting: Family Medicine

## 2023-06-15 VITALS — BP 138/74 | HR 72 | Temp 97.8°F | Ht 67.0 in | Wt 234.0 lb

## 2023-06-15 DIAGNOSIS — J4489 Other specified chronic obstructive pulmonary disease: Secondary | ICD-10-CM

## 2023-06-15 DIAGNOSIS — R051 Acute cough: Secondary | ICD-10-CM

## 2023-06-15 DIAGNOSIS — R0602 Shortness of breath: Secondary | ICD-10-CM

## 2023-06-15 DIAGNOSIS — R0600 Dyspnea, unspecified: Secondary | ICD-10-CM | POA: Diagnosis not present

## 2023-06-15 DIAGNOSIS — J449 Chronic obstructive pulmonary disease, unspecified: Secondary | ICD-10-CM | POA: Diagnosis not present

## 2023-06-15 DIAGNOSIS — R059 Cough, unspecified: Secondary | ICD-10-CM | POA: Diagnosis not present

## 2023-06-15 MED ORDER — BETAMETHASONE SOD PHOS & ACET 6 (3-3) MG/ML IJ SUSP
6.0000 mg | Freq: Once | INTRAMUSCULAR | Status: AC
Start: 2023-06-15 — End: 2023-06-15
  Administered 2023-06-15: 6 mg via INTRAMUSCULAR

## 2023-06-15 MED ORDER — MOXIFLOXACIN HCL 400 MG PO TABS: 400.0000 mg | ORAL_TABLET | Freq: Every day | ORAL | 0 refills | Status: DC

## 2023-06-15 NOTE — Progress Notes (Signed)
 Chief Complaint  Patient presents with   sick    Started 2 weeks ago. Given prednisone  and abx which he has completed. Still having cough and yellow thick congestion. Tight chest and SOB. Left side rib area hurts now, might be from cough.     HPI  Patient presents today for Patient presents with upper respiratory congestion. Rhinorrhea that is frequently purulent. There is moderate sore throat. Patient reports coughing frequently as well.  green sputum noted. There is no fever, chills or sweats. The patient denies being short of breath. Onset was 3-5 days ago. Gradually worsening. Tried OTCs without improvement.  PMH: Smoking status noted Review of Systems  Objective: BP 138/74   Pulse 72   Temp 97.8 F (36.6 C)   Ht 5\' 7"  (1.702 m)   Wt 234 lb (106.1 kg)   SpO2 94%   BMI 36.65 kg/m  Gen: NAD, alert, cooperative with exam HEENT: NCAT, Nasal passages swollen, red TMS RED CV: RRR, good S1/S2, no murmur Resp: Bronchitis changes with scattered wheezes, non-labored Ext: No edema, warm Neuro: Alert and oriented, No gross deficits Chest x-ray is negative for infiltrate Acute cough -     DG Chest 2 View; Future -     Betamethasone  Sod Phos & Acet  Shortness of breath -     DG Chest 2 View; Future -     Betamethasone  Sod Phos & Acet  Asthma-COPD overlap syndrome (HCC) -     DG Chest 2 View; Future -     Betamethasone  Sod Phos & Acet  Other orders -     Moxifloxacin HCl; Take 1 tablet (400 mg total) by mouth daily.  Dispense: 10 tablet; Refill: 0

## 2023-06-16 DIAGNOSIS — G4733 Obstructive sleep apnea (adult) (pediatric): Secondary | ICD-10-CM

## 2023-06-16 NOTE — Procedures (Signed)
 [name] [facility] [address] [place], [place] [address] Tel: [phone]   Fax: [phone]  Inspire Interpretation  Patient Name:  Kenneth Fuller, Kenneth Fuller Date:  06/09/2023 Referring Physician:  Celene Coins, MD  Indications for Polysomnography The patient is a 78 year-old Male who is 5\' 7"  and weighs 225.0 lbs. His BMI equals 35.3.  A full night inspire treatment study was performed.  Polysomnogram Data A full night polysomnogram recorded the standard physiologic parameters including EEG, EOG, EMG, EKG, nasal and oral airflow.  Respiratory parameters of chest and abdominal movements were recorded with Respiratory Inductance Plethysmography belts.  Oxygen  saturation was recorded by pulse oximetry.   Sleep Architecture The total recording time of the polysomnogram was 461.8 minutes.  The total sleep time was 373.0 minutes.  The patient spent 11.4% of total sleep time in Stage N1, 81.2% in Stage N2, 0.0% in Stages N3, and 7.4% in REM.  Sleep latency was 19.5 minutes.  REM latency was 334.5 minutes.  Sleep Efficiency was 80.8%.  Wake after Sleep Onset time was 69.0 minutes.  Inspire Summary The patient was on inspire therapy at levels ranging from 1.7* volts up to 2.7* volts.  The last level used in the study was 2.7* volts.  Respiratory Events The polysomnogram revealed a presence of 2 obstructive, - central, and - mixed apneas resulting in an Apnea index of 0.3 events per hour.  There were 357 hypopneas (>=3% desaturation and/or arousal) resulting in an Apnea\Hypopnea Index (AHI >=3% desaturation and/or arousal) of 57.7 events per hour.  There were 258 hypopneas (>=4% desaturation) resulting in an Apnea\Hypopnea Index (AHI >=4% desaturation) of 41.8 events per hour.  There were 10 Respiratory Effort Related Arousals resulting in a RERA index of 1.6 events per hour. The Respiratory Disturbance Index is 59.4 events per hour.  The snore index was - events per  hour.  Mean oxygen  saturation was 93.5%.  The lowest oxygen  saturation during sleep was 82.0%.  Time spent <=88% oxygen  saturation was 12.6 minutes (2.8%).  Limb Activity There were 263 limb movements recorded.  Of this total, 125 were classified as PLMs.  Of the PLMs, 27 were associated with arousals.  The Limb Movement index was 42.3 per hour while the PLM index was 20.1 per hour.  Cardiac Summary The average pulse rate was 72.2 bpm.  The minimum pulse rate was 64.0 bpm while the maximum pulse rate was 90.0 bpm.  Cardiac rhythm was normal/abnormal.  Diagnosis: OSA s/p Hypoglossal nerve stimulator implantation Device was titrated from 1.7 to 2.7 V with therapeutic amplitude obtained at 2.7 V  Recommendations: Device can be set at 2.7 V if clinically tolerated If not, we will have to consider alternative electrode configuration   This study was personally reviewed and electronically signed by: Celene Coins, MD Accredited Board Certified in Sleep Medicine

## 2023-06-19 ENCOUNTER — Encounter: Payer: Self-pay | Admitting: Family Medicine

## 2023-06-19 NOTE — Progress Notes (Signed)
 Your chest x-ray looked normal. Thanks, WS.

## 2023-06-20 ENCOUNTER — Encounter (HOSPITAL_COMMUNITY): Payer: Self-pay

## 2023-06-27 ENCOUNTER — Telehealth: Payer: Self-pay

## 2023-06-27 ENCOUNTER — Other Ambulatory Visit (HOSPITAL_COMMUNITY): Payer: Self-pay

## 2023-06-27 ENCOUNTER — Ambulatory Visit: Payer: PPO | Admitting: Family Medicine

## 2023-06-27 ENCOUNTER — Other Ambulatory Visit: Payer: Self-pay

## 2023-06-27 DIAGNOSIS — I10 Essential (primary) hypertension: Secondary | ICD-10-CM

## 2023-06-27 DIAGNOSIS — E1142 Type 2 diabetes mellitus with diabetic polyneuropathy: Secondary | ICD-10-CM

## 2023-06-27 DIAGNOSIS — N1832 Chronic kidney disease, stage 3b: Secondary | ICD-10-CM

## 2023-06-27 DIAGNOSIS — E1169 Type 2 diabetes mellitus with other specified complication: Secondary | ICD-10-CM

## 2023-06-27 NOTE — Telephone Encounter (Signed)
 Pharmacy Patient Advocate Encounter  Received notification from [facility] ADVANTAGE/RX ADVANCE that Prior Authorization for Roflumilast  tablets has been APPROVED from 06/27/23 to 06/26/24. Ran test claim, Copay is $70.95. This test claim was processed through [facility]- copay amounts may vary at other pharmacies due to pharmacy/plan contracts, or as the patient moves through the different stages of their insurance plan.   PA #/Case ID/Reference #: [id number]

## 2023-06-27 NOTE — Telephone Encounter (Signed)
 Pharmacy Patient Advocate Encounter   Received notification from CoverMyMeds that prior authorization for Roflumilast  tablets is required/requested.   Insurance verification completed.   The patient is insured through [facility] ADVANTAGE/RX ADVANCE .   Per test claim: PA required; PA submitted to above mentioned insurance via CoverMyMeds Key/confirmation #/EOC [id number] Status is pending

## 2023-06-28 ENCOUNTER — Ambulatory Visit (HOSPITAL_BASED_OUTPATIENT_CLINIC_OR_DEPARTMENT_OTHER): Admitting: Pulmonary Disease

## 2023-06-28 ENCOUNTER — Other Ambulatory Visit (HOSPITAL_COMMUNITY): Payer: Self-pay | Admitting: Orthopedic Surgery

## 2023-06-28 DIAGNOSIS — M5459 Other low back pain: Secondary | ICD-10-CM | POA: Diagnosis not present

## 2023-06-28 DIAGNOSIS — M5416 Radiculopathy, lumbar region: Secondary | ICD-10-CM | POA: Diagnosis not present

## 2023-06-28 DIAGNOSIS — M48061 Spinal stenosis, lumbar region without neurogenic claudication: Secondary | ICD-10-CM | POA: Diagnosis not present

## 2023-06-30 ENCOUNTER — Ambulatory Visit (HOSPITAL_COMMUNITY)
Admission: RE | Admit: 2023-06-30 | Discharge: 2023-06-30 | Disposition: A | Discharge status: Home / Self Care | Source: Ambulatory Visit | Attending: Orthopedic Surgery | Admitting: Orthopedic Surgery

## 2023-06-30 DIAGNOSIS — M5136 Other intervertebral disc degeneration, lumbar region with discogenic back pain only: Secondary | ICD-10-CM | POA: Diagnosis not present

## 2023-06-30 DIAGNOSIS — M5459 Other low back pain: Secondary | ICD-10-CM | POA: Insufficient documentation

## 2023-06-30 DIAGNOSIS — M5126 Other intervertebral disc displacement, lumbar region: Secondary | ICD-10-CM | POA: Diagnosis not present

## 2023-06-30 DIAGNOSIS — M47816 Spondylosis without myelopathy or radiculopathy, lumbar region: Secondary | ICD-10-CM | POA: Diagnosis not present

## 2023-06-30 DIAGNOSIS — M4807 Spinal stenosis, lumbosacral region: Secondary | ICD-10-CM | POA: Diagnosis not present

## 2023-07-05 ENCOUNTER — Encounter (HOSPITAL_BASED_OUTPATIENT_CLINIC_OR_DEPARTMENT_OTHER): Payer: Self-pay | Admitting: Adult Health

## 2023-07-05 ENCOUNTER — Ambulatory Visit (HOSPITAL_BASED_OUTPATIENT_CLINIC_OR_DEPARTMENT_OTHER): Admitting: Adult Health

## 2023-07-05 ENCOUNTER — Encounter: Payer: Self-pay | Admitting: Adult Health

## 2023-07-05 VITALS — BP 144/84 | HR 74 | Ht 67.0 in | Wt 233.5 lb

## 2023-07-05 DIAGNOSIS — Z87891 Personal history of nicotine dependence: Secondary | ICD-10-CM | POA: Diagnosis not present

## 2023-07-05 DIAGNOSIS — G4733 Obstructive sleep apnea (adult) (pediatric): Secondary | ICD-10-CM

## 2023-07-05 DIAGNOSIS — J4489 Other specified chronic obstructive pulmonary disease: Secondary | ICD-10-CM | POA: Diagnosis not present

## 2023-07-05 NOTE — Patient Instructions (Signed)
 Continue on Albuterol  inhaler or neb As needed   Continue on Daliresp  daily   Continue on Inspire each night  Current level is 0.6v-Level 1  Go up 1 level each week as tolerated.  Follow up in 8 weeks with Dr. Villa Greaser  or Starlee Corralejo NP

## 2023-07-05 NOTE — Progress Notes (Signed)
 @Patient  ID: Kenneth Fuller, male    DOB: [birth year], 78 y.o.   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  TEST/EVENTS :  Inspire:  Hypoglossal nerve stimulator device implantation October 26, 2022 Activation 01/17/23 delayed  Sensation 1.6 V , functional level 1.7 V lower limit, upper limit 2.7 V   Start delay 30 minutes, pause time 15 minutes, duration 8 hours     02/2023 OV  could not tolerate 2.0 V and  backed down to 1.8 V  his sensation level  decreased to 1.3 V, we changed his range to 1.4 to 2.4 V and reduced his amplitude down to 1.7 V   NPSG 06/2020 >> BiPAP 15/11 ,med FF mask 11/2015 NPSG  very severe OSA, AHI >100. Split night study showed optimal control with CPAP 5-20     PFTs 01/2021 no airway obstruction, moderate restriction ratio 74, FEV1 55%, FVC 53%, 12% bronchodilator response, FEV1 improved to 62%, DLCO 81%    12/2016 PFT >> ratio 84, severe restriction with reduced diffusion capacity.  Total lung capacity 72% FVC, 1.6 L / 40% and DLCO of 19.4/68%.   PFT 1/ 2019 showed FEV1 45%, ratio 68, FVC 48% consistent with moderate to severe airflow obstruction   Feno 01/26/2017 ->  8ppb     High resolution CT chest 01/2017 neg for ILD   CT angiogram chest 05/2020 clear lungs CT sinus March 09, 2017 clear sinuse  07/05/2023 Follow up ; OSA  Patient returns for a 12-month follow-up.  Patient has severe obstructive sleep apnea.  CPAP intolerant.  He is status post inspire device implantation September 2024.  He is here today to  review recent inspire titration results.  He underwent titration study Jun 09, 2023, titrated from 1.7 to 2.7 v, therapeutic at 2.7 with AHI 6.6/hr. patient is currently on 2.0 V with a range at 1.4 to 2.4 V.  Pulse width 90 rate 33.  Start delay at 45, pause time 15 and duration at 10 hours.  Stimulation test today in the office shows patient is unable to tolerate higher levels.  Electrode configuration was changed-did not tolerate higher levels of on electrode A , electric C for tongue motion.  Electrode B great tongue protrusion and comfortable amplitudes tested at 0.6 to 1.0 V.   Patient is followed for COPD with asthma.  He says overall breathing is doing well.  Rarely uses albuterol .  Does remain on Daliresp  daily.  Previously on maintenance inhalers with Breztri  with no perceived benefit.  Denies any flare of cough or wheezing.      Allergies  Allergen Reactions   Amlodipine  Besy-Benazepril Hcl Swelling and Other (See Comments)    Makes tongue swell (lotrel)   Hydrocodone  Itching    Can tolerate in low doses   Oxycodone  Itching   Phenergan  [Promethazine  Hcl] Other (See Comments)    "I can't remember."    Immunization History  Administered Date(s) Administered   Fluad Quad(high Dose 65+) 11/15/2018, 01/26/2021, 10/30/2021   Fluad Trivalent(High Dose 65+) 12/23/2022   Influenza Whole 10/09/2008   Influenza, High Dose Seasonal  PF 11/29/2016, 11/08/2017   Influenza,inj,Quad PF,6+ Mos 12/04/2013, 12/04/2014, 12/23/2015   Pneumococcal Conjugate-13 12/04/2013   Pneumococcal Polysaccharide-23 08/26/2011   Td 11/08/2017    Past Medical History:  Diagnosis Date   Allergy     Anxiety    Asthma    CAD (coronary artery disease)    CHF (congestive heart failure) (HCC)    COPD (chronic obstructive pulmonary disease) (HCC)    Depression    Diabetes mellitus    Fibromyalgia    GERD (gastroesophageal reflux disease)    GI bleeding    Gout    Hyperlipidemia    Hypertension     Hypogonadism male    Insomnia    MRSA cellulitis    Neuropathy    Obesity    Shortness of breath dyspnea    with exertion    Sleep apnea    does not use CPAP   Wheezing    no asthma diagnosis    Tobacco History: Social History   Tobacco Use  Smoking Status Former   Current packs/day: 0.00   Average packs/day: 1 pack/day for 5.0 years (5.0 ttl pk-yrs)   Types: Cigarettes   Start date: 02/09/1964   Quit date: 02/08/1969   Years since quitting: 54.4  Smokeless Tobacco Former   Quit date: 1971   Counseling given: Not Answered   Outpatient Medications Prior to Visit  Medication Sig Dispense Refill   albuterol  (PROVENTIL ) (2.5 MG/3ML) 0.083% nebulizer solution Take 3 mLs (2.5 mg total) by nebulization every 6 (six) hours as needed for wheezing or shortness of breath. 180 mL 1   albuterol  (VENTOLIN  HFA) 108 (90 Base) MCG/ACT inhaler Inhale 2 puffs into the lungs every 6 (six) hours as needed for wheezing or shortness of breath. 1 each 2   atorvastatin  (LIPITOR) 40 MG tablet Take 40 mg by mouth daily.     bisoprolol  (ZEBETA ) 5 MG tablet Take 1/2 (one-half) tablet by mouth once daily 45 tablet 3   Blood Glucose Monitoring Suppl (ONETOUCH VERIO FLEX SYSTEM) w/Device KIT Use to test blood sugar daily as directed. DX: E11.65 1 kit 0   cetirizine  (ZYRTEC ) 10 MG tablet Take 1 tablet (10 mg total) by mouth daily. 90 tablet 3   Cholecalciferol  (VITAMIN D3) 125 MCG (5000 UT) TABS Take 5,000 Units by mouth daily.     dapagliflozin  propanediol (FARXIGA ) 10 MG TABS tablet Take 1 tablet (10 mg total) by mouth daily. 90 tablet 3   fluticasone  (FLONASE ) 50 MCG/ACT nasal spray Place 2 sprays into both nostrils daily. (Patient taking differently: Place 2 sprays into both nostrils daily as needed for allergies.) 16 g 6   furosemide  (LASIX ) 40 MG tablet Take 40 mg by mouth daily as needed (fluid retention.).     GEMTESA 75 MG TABS Take 75 mg by mouth daily.     glucose blood (ONETOUCH VERIO) test strip  test blood sugar daily as directed. DX: E11.65 100 each 3   hydrALAZINE  (APRESOLINE ) 50 MG tablet Take 1.5 tablets (75 mg total) by mouth 3 (three) times daily.     isosorbide  dinitrate (ISORDIL ) 20 MG tablet Take 1 tablet (20 mg total) by mouth 3 (three) times daily. 270 tablet 3   Lancets (ONETOUCH DELICA PLUS LANCET33G) MISC USE 1 LANCET TO CHECK GLUCOSE ONCE DAILY AS DIRECTED 100 each 8   moxifloxacin  (AVELOX ) 400 MG tablet Take 1 tablet (400 mg total) by mouth daily. 10 tablet 0   nitroGLYCERIN  (NITROSTAT ) 0.4 MG SL tablet Place 0.4 mg  under the tongue every 5 (five) minutes as needed for chest pain.     omeprazole  (PRILOSEC) 40 MG capsule TAKE 1 CAPSULE BY MOUTH ONCE DAILY 30 TO 60 MINUTES BEFORE BREAKFAST 90 capsule 3   OVER THE COUNTER MEDICATION Take 3 capsules by mouth daily. Super Beets     roflumilast  (DALIRESP ) 500 MCG TABS tablet Take 1 tablet (500 mcg total) by mouth daily. 90 tablet 3   Semaglutide ,0.25 or 0.5MG /DOS, (OZEMPIC , 0.25 OR 0.5 MG/DOSE,) 2 MG/3ML SOPN Inject 0.5 mg into the skin once a week.     sertraline  (ZOLOFT ) 100 MG tablet Take 1 tablet (100 mg total) by mouth daily. 90 tablet 3   tamsulosin  (FLOMAX ) 0.4 MG CAPS capsule Take 1 capsule (0.4 mg total) by mouth daily after supper. 90 capsule 3   amoxicillin -clavulanate (AUGMENTIN ) 875-125 MG tablet Take 1 tablet by mouth 2 (two) times daily. (Patient not taking: Reported on 07/05/2023) 20 tablet 0   HYDROcodone -acetaminophen  (NORCO/VICODIN) 5-325 MG tablet Take 1-2 tablets by mouth every 4 (four) hours as needed for severe pain. (Patient not taking: Reported on 07/05/2023)     HYDROcodone -acetaminophen  (NORCO/VICODIN) 5-325 MG tablet Take 1-2 tablets by mouth every 6 (six) hours as needed for moderate pain. (Patient not taking: Reported on 07/05/2023) 12 tablet 0   predniSONE  (DELTASONE ) 20 MG tablet 2 po at same time daily for 5 days (Patient not taking: Reported on 07/05/2023) 10 tablet 0   No facility-administered  medications prior to visit.     Review of Systems:   Constitutional:   No  weight loss, night sweats,  Fevers, chills, fatigue, or  lassitude.  HEENT:   No headaches,  Difficulty swallowing,  Tooth/dental problems, or  Sore throat,                No sneezing, itching, ear ache, nasal congestion, post nasal drip,   CV:  No chest pain,  Orthopnea, PND, swelling in lower extremities, anasarca, dizziness, palpitations, syncope.   GI  No heartburn, indigestion, abdominal pain, nausea, vomiting, diarrhea, change in bowel habits, loss of appetite, bloody stools.   Resp: No shortness of breath with exertion or at rest.  No excess mucus, no productive cough,  No non-productive cough,  No coughing up of blood.  No change in color of mucus.  No wheezing.  No chest wall deformity  Skin: no rash or lesions.  GU: no dysuria, change in color of urine, no urgency or frequency.  No flank pain, no hematuria   MS:  No joint pain or swelling.  No decreased range of motion.  No back pain.    Physical Exam  BP (!) 144/84   Pulse 74   Ht 5\' 7"  (1.702 m)   Wt 233 lb 8 oz (105.9 kg)   SpO2 97%   BMI 36.57 kg/m   GEN: A/Ox3; pleasant , NAD, well nourished    HEENT:  [place]/AT,  EACs-clear, TMs-wnl, NOSE-clear, THROAT-clear, no lesions, no postnasal drip or exudate noted.  Normal tongue movement and protrusion.  NECK:  Supple w/ fair ROM; no JVD; normal carotid impulses w/o bruits; no thyromegaly or nodules palpated; no lymphadenopathy.    RESP  Clear  P & A; w/o, wheezes/ rales/ or rhonchi. no accessory muscle use, no dullness to percussion  CARD:  RRR, no m/r/g, no peripheral edema, pulses intact, no cyanosis or clubbing.  GI:   Soft & nt; nml bowel sounds; no organomegaly or masses detected.   Musco: Warm bil,  no deformities or joint swelling noted.   Neuro: alert, no focal deficits noted.    Skin: Warm, no lesions or rashes    Lab Results:          Latest Ref Rng & Units 03/03/2021     2:57 PM 02/25/2017    9:23 AM 12/23/2016    8:55 AM  PFT Results  FVC-Pre L 2.04  1.77  1.58   FVC-Predicted Pre % 53  45  40   FVC-Post L 2.27  1.89  2.06   FVC-Predicted Post % 60  48  52   Pre FEV1/FVC % % 74  67  84   Post FEV1/FCV % % 75  68  79   FEV1-Pre L 1.51  1.19  1.32   FEV1-Predicted Pre % 55  41  45   FEV1-Post L 1.71  1.29  1.61   DLCO uncorrected ml/min/mmHg 18.86   19.37   DLCO UNC% % 81   68   DLVA Predicted % 131   126   TLC L 4.19   4.64   TLC % Predicted % 65   72   RV % Predicted % 86   115     Lab Results  Component Value Date   NITRICOXIDE 12 02/25/2017        Assessment & Plan:   No problem-specific Assessment & Plan notes found for this encounter.     Roena Clark, NP 07/05/2023

## 2023-07-05 NOTE — Assessment & Plan Note (Signed)
 Severe OSA- CPAP intolerant. Unable to tolerate higher levels on electrode A setting. Changed to Electrode B , current setting 0.6 V , range 0.6 to 1.0 V . Advised to increase by 1 level each week as tolerated.   Plan  Patient Instructions  Continue on Albuterol  inhaler or neb As needed   Continue on Daliresp  daily   Continue on Inspire each night  Current level is 0.6v-Level 1  Go up 1 level each week as tolerated.  Follow up in 8 weeks with Dr. Villa Greaser  or Kaiyu Mirabal NP

## 2023-07-05 NOTE — Assessment & Plan Note (Signed)
 Currently doing well on present regimen. Plan  Patient Instructions  Continue on Albuterol  inhaler or neb As needed   Continue on Daliresp  daily   Continue on Inspire each night  Current level is 0.6v-Level 1  Go up 1 level each week as tolerated.  Follow up in 8 weeks with Dr. Villa Greaser  or Mozell Hardacre NP

## 2023-07-07 ENCOUNTER — Encounter: Payer: Self-pay | Admitting: Family Medicine

## 2023-07-07 ENCOUNTER — Ambulatory Visit: Admitting: Family Medicine

## 2023-07-07 ENCOUNTER — Other Ambulatory Visit

## 2023-07-07 VITALS — BP 152/83 | HR 73 | Ht 67.0 in | Wt 230.0 lb

## 2023-07-07 DIAGNOSIS — Z7985 Long-term (current) use of injectable non-insulin antidiabetic drugs: Secondary | ICD-10-CM

## 2023-07-07 DIAGNOSIS — E785 Hyperlipidemia, unspecified: Secondary | ICD-10-CM

## 2023-07-07 DIAGNOSIS — I10 Essential (primary) hypertension: Secondary | ICD-10-CM

## 2023-07-07 DIAGNOSIS — J4489 Other specified chronic obstructive pulmonary disease: Secondary | ICD-10-CM

## 2023-07-07 DIAGNOSIS — E119 Type 2 diabetes mellitus without complications: Secondary | ICD-10-CM

## 2023-07-07 DIAGNOSIS — E1169 Type 2 diabetes mellitus with other specified complication: Secondary | ICD-10-CM | POA: Diagnosis not present

## 2023-07-07 DIAGNOSIS — N1831 Chronic kidney disease, stage 3a: Secondary | ICD-10-CM

## 2023-07-07 DIAGNOSIS — N1832 Chronic kidney disease, stage 3b: Secondary | ICD-10-CM | POA: Diagnosis not present

## 2023-07-07 DIAGNOSIS — I5032 Chronic diastolic (congestive) heart failure: Secondary | ICD-10-CM | POA: Diagnosis not present

## 2023-07-07 DIAGNOSIS — E1142 Type 2 diabetes mellitus with diabetic polyneuropathy: Secondary | ICD-10-CM

## 2023-07-07 LAB — BAYER DCA HB A1C WAIVED: HB A1C (BAYER DCA - WAIVED): 5.4 % (ref 4.8–5.6)

## 2023-07-07 LAB — LIPID PANEL

## 2023-07-07 NOTE — Progress Notes (Signed)
 BP (!) 152/83   Pulse 73   Ht 5\' 7"  (1.702 m)   Wt 230 lb (104.3 kg)   SpO2 95%   BMI 36.02 kg/m    Subjective:   Patient ID: Kenneth Fuller, male    DOB: 1945/09/21, 78 y.o.   MRN: [id number]  HPI: Kenneth Fuller is a 78 y.o. male presenting on 07/07/2023 for Medical Management of Chronic Issues, Diabetes, Chronic Kidney Disease, and Hypertension   HPI Type 2 diabetes mellitus Patient comes in today for recheck of his diabetes. Patient has been currently taking Ozempic  and Farxiga . Patient is not currently on an ACE inhibitor/ARB. Patient has not seen an ophthalmologist this year. Patient denies any new issues with their feet. The symptom started onset as an adult hypertension and hyperlipidemia ARE RELATED TO DM   Hypertension Patient is currently on Imdur  and hydralazine  and bisoprolol , and their blood pressure today is 152/83. Patient denies any lightheadedness or dizziness. Patient denies headaches, blurred vision, chest pains, shortness of breath, or weakness. Denies any side effects from medication and is content with current medication.   Hyperlipidemia Patient is coming in for recheck of his hyperlipidemia. The patient is currently taking Lipitor. They deny any issues with myalgias or history of liver damage from it. They deny any focal numbness or weakness or chest pain.   COPD/asthma overlap syndrome and chronic hypoxia Patient is coming in for COPD recheck today.  He is currently on albuterol  and Daliresp .  He has a mild chronic cough but denies any major coughing spells or wheezing spells.  He has 0 nighttime symptoms per week and 0 daytime symptoms per week currently.  He feels like he is doing really well.  Relevant past medical, surgical, family and social history reviewed and updated as indicated. Interim medical history since our last visit reviewed. Allergies and medications reviewed and updated.  Review of Systems  Constitutional:  Negative for chills and  fever.  Eyes:  Negative for visual disturbance.  Respiratory:  Negative for shortness of breath and wheezing.   Cardiovascular:  Negative for chest pain and leg swelling.  Musculoskeletal:  Negative for back pain and gait problem.  Skin:  Negative for rash.  Neurological:  Negative for dizziness, weakness and light-headedness.  All other systems reviewed and are negative.   Per HPI unless specifically indicated above   Allergies as of 07/07/2023       Reactions   Amlodipine  Besy-benazepril Hcl Swelling, Other (See Comments)   Makes tongue swell (lotrel)   Hydrocodone  Itching   Can tolerate in low doses   Oxycodone  Itching   Phenergan  [promethazine  Hcl] Other (See Comments)   "I can't remember."        Medication List        Accurate as of Jul 07, 2023  4:05 PM. If you have any questions, ask your nurse or doctor.          STOP taking these medications    amoxicillin -clavulanate 875-125 MG tablet Commonly known as: AUGMENTIN  Stopped by: Lucio Sabin Jahmel Flannagan   HYDROcodone -acetaminophen  5-325 MG tablet Commonly known as: NORCO/VICODIN Stopped by: Lucio Sabin Leara Rawl   moxifloxacin  400 MG tablet Commonly known as: AVELOX  Stopped by: Lucio Sabin Bayan Kushnir   predniSONE  20 MG tablet Commonly known as: DELTASONE  Stopped by: Lucio Sabin Avari Nevares       TAKE these medications    albuterol  108 (90 Base) MCG/ACT inhaler Commonly known as: VENTOLIN  HFA Inhale 2 puffs into the lungs every  6 (six) hours as needed for wheezing or shortness of breath.   albuterol  (2.5 MG/3ML) 0.083% nebulizer solution Commonly known as: PROVENTIL  Take 3 mLs (2.5 mg total) by nebulization every 6 (six) hours as needed for wheezing or shortness of breath.   atorvastatin  40 MG tablet Commonly known as: LIPITOR Take 40 mg by mouth daily.   bisoprolol  5 MG tablet Commonly known as: ZEBETA  Take 1/2 (one-half) tablet by mouth once daily   cetirizine  10 MG tablet Commonly known as:  ZYRTEC  Take 1 tablet (10 mg total) by mouth daily.   dapagliflozin  propanediol 10 MG Tabs tablet Commonly known as: Farxiga  Take 1 tablet (10 mg total) by mouth daily.   fluticasone  50 MCG/ACT nasal spray Commonly known as: FLONASE  Place 2 sprays into both nostrils daily. What changed:  when to take this reasons to take this   furosemide  40 MG tablet Commonly known as: LASIX  Take 40 mg by mouth daily as needed (fluid retention.).   Gemtesa 75 MG Tabs Generic drug: Vibegron Take 75 mg by mouth daily.   hydrALAZINE  50 MG tablet Commonly known as: APRESOLINE  Take 1.5 tablets (75 mg total) by mouth 3 (three) times daily.   isosorbide  dinitrate 20 MG tablet Commonly known as: ISORDIL  Take 1 tablet (20 mg total) by mouth 3 (three) times daily.   nitroGLYCERIN  0.4 MG SL tablet Commonly known as: NITROSTAT  Place 0.4 mg under the tongue every 5 (five) minutes as needed for chest pain.   omeprazole  40 MG capsule Commonly known as: PRILOSEC TAKE 1 CAPSULE BY MOUTH ONCE DAILY 30 TO 60 MINUTES BEFORE BREAKFAST   OneTouch Delica Plus Lancet33G Misc USE 1 LANCET TO CHECK GLUCOSE ONCE DAILY AS DIRECTED   OneTouch Verio Flex System w/Device Kit Use to test blood sugar daily as directed. DX: E11.65   OneTouch Verio test strip Generic drug: glucose blood test blood sugar daily as directed. DX: E11.65   OVER THE COUNTER MEDICATION Take 3 capsules by mouth daily. Super Beets   Ozempic  (0.25 or 0.5 MG/DOSE) 2 MG/3ML Sopn Generic drug: Semaglutide (0.25 or 0.5MG /DOS) Inject 0.5 mg into the skin once a week.   roflumilast  500 MCG Tabs tablet Commonly known as: DALIRESP  Take 1 tablet (500 mcg total) by mouth daily.   sertraline  100 MG tablet Commonly known as: ZOLOFT  Take 1 tablet (100 mg total) by mouth daily.   tamsulosin  0.4 MG Caps capsule Commonly known as: FLOMAX  Take 1 capsule (0.4 mg total) by mouth daily after supper.   Vitamin D3 125 MCG (5000 UT) Tabs Take 5,000  Units by mouth daily.         Objective:   BP (!) 152/83   Pulse 73   Ht 5\' 7"  (1.702 m)   Wt 230 lb (104.3 kg)   SpO2 95%   BMI 36.02 kg/m   Wt Readings from Last 3 Encounters:  07/07/23 230 lb (104.3 kg)  07/05/23 233 lb 8 oz (105.9 kg)  06/15/23 234 lb (106.1 kg)    Physical Exam Vitals and nursing note reviewed.  Constitutional:      General: He is not in acute distress.    Appearance: He is well-developed. He is not diaphoretic.  Eyes:     General: No scleral icterus.    Conjunctiva/sclera: Conjunctivae normal.  Neck:     Thyroid : No thyromegaly.  Cardiovascular:     Rate and Rhythm: Normal rate and regular rhythm.     Heart sounds: Normal heart sounds. No murmur heard. Pulmonary:  Effort: Pulmonary effort is normal. No respiratory distress.     Breath sounds: Normal breath sounds. No wheezing.  Musculoskeletal:        General: No swelling. Normal range of motion.     Cervical back: Neck supple.  Lymphadenopathy:     Cervical: No cervical adenopathy.  Skin:    General: Skin is warm and dry.     Findings: No rash.  Neurological:     Mental Status: He is alert and oriented to person, place, and time.     Coordination: Coordination normal.  Psychiatric:        Behavior: Behavior normal.       Assessment & Plan:   Problem List Items Addressed This Visit       Cardiovascular and Mediastinum   Essential hypertension   CHF (congestive heart failure) (HCC)     Respiratory   Asthma-COPD overlap syndrome (HCC)     Endocrine   Diabetes mellitus type II, non insulin  dependent (HCC) - Primary   Relevant Orders   Microalbumin/Creatinine Ratio, Urine   Hyperlipidemia associated with type 2 diabetes mellitus (HCC)     Genitourinary   CKD (chronic kidney disease), stage III (HCC)    A1c was 5.4.  BP was mildly elevated in he is going monitor closely at home.  He says it normally runs little bit better. Follow up plan: Return in about 3 months  (around 10/07/2023), or if symptoms worsen or fail to improve, for Diabetes recheck.  Counseling provided for all of the vaccine components Orders Placed This Encounter  Procedures   Microalbumin/Creatinine Ratio, Urine    Jolyne Needs, MD [facility] 07/07/2023, 4:05 PM

## 2023-07-08 LAB — CMP14+EGFR
ALT: 20 IU/L (ref 0–44)
AST: 27 IU/L (ref 0–40)
Albumin: 4.3 g/dL (ref 3.8–4.8)
Alkaline Phosphatase: 91 IU/L (ref 44–121)
BUN/Creatinine Ratio: 13 (ref 10–24)
BUN: 19 mg/dL (ref 8–27)
Bilirubin Total: 0.4 mg/dL (ref 0.0–1.2)
CO2: 21 mmol/L (ref 20–29)
Calcium: 9.2 mg/dL (ref 8.6–10.2)
Chloride: 102 mmol/L (ref 96–106)
Creatinine, Ser: 1.43 mg/dL — ABNORMAL HIGH (ref 0.76–1.27)
Globulin, Total: 2.4 g/dL (ref 1.5–4.5)
Glucose: 99 mg/dL (ref 70–99)
Potassium: 5.2 mmol/L (ref 3.5–5.2)
Sodium: 143 mmol/L (ref 134–144)
Total Protein: 6.7 g/dL (ref 6.0–8.5)
eGFR: 50 mL/min/{1.73_m2} — ABNORMAL LOW (ref >59)

## 2023-07-08 LAB — CBC WITH DIFFERENTIAL/PLATELET
Basophils Absolute: 0.1 10*3/uL (ref 0.0–0.2)
Basos: 1 %
EOS (ABSOLUTE): 0.2 10*3/uL (ref 0.0–0.4)
Eos: 3 %
Hematocrit: 48.1 % (ref 37.5–51.0)
Hemoglobin: 15 g/dL (ref 13.0–17.7)
Immature Grans (Abs): 0 10*3/uL (ref 0.0–0.1)
Immature Granulocytes: 0 %
Lymphocytes Absolute: 2 10*3/uL (ref 0.7–3.1)
Lymphs: 34 %
MCH: 26 pg — ABNORMAL LOW (ref 26.6–33.0)
MCHC: 31.2 g/dL — ABNORMAL LOW (ref 31.5–35.7)
MCV: 83 fL (ref 79–97)
Monocytes Absolute: 0.4 10*3/uL (ref 0.1–0.9)
Monocytes: 7 %
Neutrophils Absolute: 3.3 10*3/uL (ref 1.4–7.0)
Neutrophils: 55 %
Platelets: 207 10*3/uL (ref 150–450)
RBC: 5.77 x10E6/uL (ref 4.14–5.80)
RDW: 15 % (ref 11.6–15.4)
WBC: 6 10*3/uL (ref 3.4–10.8)

## 2023-07-08 LAB — LIPID PANEL
Cholesterol, Total: 168 mg/dL (ref 100–199)
HDL: 41 mg/dL (ref >39)
LDL CALC COMMENT:: 4.1 ratio (ref 0.0–5.0)
LDL Chol Calc (NIH): 99 mg/dL (ref 0–99)
Triglycerides: 157 mg/dL — ABNORMAL HIGH (ref 0–149)
VLDL Cholesterol Cal: 28 mg/dL (ref 5–40)

## 2023-07-15 ENCOUNTER — Ambulatory Visit: Payer: Self-pay | Admitting: Family Medicine

## 2023-07-15 ENCOUNTER — Other Ambulatory Visit: Payer: Self-pay

## 2023-07-15 DIAGNOSIS — R7989 Other specified abnormal findings of blood chemistry: Secondary | ICD-10-CM

## 2023-07-18 ENCOUNTER — Other Ambulatory Visit: Payer: Self-pay | Admitting: Family Medicine

## 2023-07-18 DIAGNOSIS — I10 Essential (primary) hypertension: Secondary | ICD-10-CM

## 2023-07-19 DIAGNOSIS — M545 Low back pain, unspecified: Secondary | ICD-10-CM | POA: Diagnosis not present

## 2023-07-19 DIAGNOSIS — M25512 Pain in left shoulder: Secondary | ICD-10-CM | POA: Diagnosis not present

## 2023-07-20 ENCOUNTER — Encounter: Payer: Self-pay | Admitting: Cardiovascular Disease

## 2023-08-01 ENCOUNTER — Ambulatory Visit: Admitting: Adult Health

## 2023-08-01 ENCOUNTER — Ambulatory Visit: Admitting: Primary Care

## 2023-08-01 DIAGNOSIS — M48062 Spinal stenosis, lumbar region with neurogenic claudication: Secondary | ICD-10-CM | POA: Diagnosis not present

## 2023-08-24 ENCOUNTER — Ambulatory Visit: Admitting: Family Medicine

## 2023-08-24 ENCOUNTER — Other Ambulatory Visit: Payer: Self-pay

## 2023-08-24 ENCOUNTER — Encounter: Payer: Self-pay | Admitting: Family Medicine

## 2023-08-24 VITALS — BP 130/68 | HR 72 | Ht 67.0 in | Wt 235.0 lb

## 2023-08-24 DIAGNOSIS — E119 Type 2 diabetes mellitus without complications: Secondary | ICD-10-CM

## 2023-08-24 DIAGNOSIS — E785 Hyperlipidemia, unspecified: Secondary | ICD-10-CM

## 2023-08-24 DIAGNOSIS — E1142 Type 2 diabetes mellitus with diabetic polyneuropathy: Secondary | ICD-10-CM

## 2023-08-24 DIAGNOSIS — J441 Chronic obstructive pulmonary disease with (acute) exacerbation: Secondary | ICD-10-CM | POA: Diagnosis not present

## 2023-08-24 DIAGNOSIS — E1169 Type 2 diabetes mellitus with other specified complication: Secondary | ICD-10-CM

## 2023-08-24 DIAGNOSIS — J45991 Cough variant asthma: Secondary | ICD-10-CM

## 2023-08-24 DIAGNOSIS — I10 Essential (primary) hypertension: Secondary | ICD-10-CM | POA: Diagnosis not present

## 2023-08-24 DIAGNOSIS — E1122 Type 2 diabetes mellitus with diabetic chronic kidney disease: Secondary | ICD-10-CM | POA: Diagnosis not present

## 2023-08-24 DIAGNOSIS — N1831 Chronic kidney disease, stage 3a: Secondary | ICD-10-CM

## 2023-08-24 LAB — LIPID PANEL
Chol/HDL Ratio: 3.3 ratio (ref 0.0–5.0)
Cholesterol, Total: 121 mg/dL (ref 100–199)
HDL: 37 mg/dL — ABNORMAL LOW (ref >39)
LDL Chol Calc (NIH): 58 mg/dL (ref 0–99)
Triglycerides: 150 mg/dL — ABNORMAL HIGH (ref 0–149)
VLDL Cholesterol Cal: 26 mg/dL (ref 5–40)

## 2023-08-24 LAB — CBC WITH DIFFERENTIAL/PLATELET
Basophils Absolute: 0.1 x10E3/uL (ref 0.0–0.2)
Basos: 1 %
EOS (ABSOLUTE): 0.2 x10E3/uL (ref 0.0–0.4)
Eos: 3 %
Hematocrit: 44.8 % (ref 37.5–51.0)
Hemoglobin: 14.1 g/dL (ref 13.0–17.7)
Immature Grans (Abs): 0 x10E3/uL (ref 0.0–0.1)
Immature Granulocytes: 0 %
Lymphocytes Absolute: 1.9 x10E3/uL (ref 0.7–3.1)
Lymphs: 29 %
MCH: 26 pg — ABNORMAL LOW (ref 26.6–33.0)
MCHC: 31.5 g/dL (ref 31.5–35.7)
MCV: 83 fL (ref 79–97)
Monocytes Absolute: 0.5 x10E3/uL (ref 0.1–0.9)
Monocytes: 8 %
Neutrophils Absolute: 3.7 x10E3/uL (ref 1.4–7.0)
Neutrophils: 59 %
Platelets: 190 x10E3/uL (ref 150–450)
RBC: 5.42 x10E6/uL (ref 4.14–5.80)
RDW: 15 % (ref 11.6–15.4)
WBC: 6.3 x10E3/uL (ref 3.4–10.8)

## 2023-08-24 LAB — CMP14+EGFR
ALT: 16 IU/L (ref 0–44)
AST: 20 IU/L (ref 0–40)
Albumin: 3.9 g/dL (ref 3.8–4.8)
Alkaline Phosphatase: 83 IU/L (ref 44–121)
BUN/Creatinine Ratio: 17 (ref 10–24)
BUN: 21 mg/dL (ref 8–27)
Bilirubin Total: 0.3 mg/dL (ref 0.0–1.2)
CO2: 20 mmol/L (ref 20–29)
Calcium: 8.6 mg/dL (ref 8.6–10.2)
Chloride: 104 mmol/L (ref 96–106)
Creatinine, Ser: 1.25 mg/dL (ref 0.76–1.27)
Globulin, Total: 2.1 g/dL (ref 1.5–4.5)
Glucose: 105 mg/dL — ABNORMAL HIGH (ref 70–99)
Potassium: 4.1 mmol/L (ref 3.5–5.2)
Sodium: 140 mmol/L (ref 134–144)
Total Protein: 6 g/dL (ref 6.0–8.5)
eGFR: 59 mL/min/1.73 — ABNORMAL LOW (ref >59)

## 2023-08-24 LAB — HGB A1C W/O EAG: Hgb A1c MFr Bld: 5.3 % (ref 4.8–5.6)

## 2023-08-24 MED ORDER — ALBUTEROL SULFATE (2.5 MG/3ML) 0.083% IN NEBU: 2.5000 mg | INHALATION_SOLUTION | Freq: Four times a day (QID) | RESPIRATORY_TRACT | 1 refills | Status: AC | PRN

## 2023-08-24 MED ORDER — ISOSORBIDE DINITRATE 20 MG PO TABS: 20.0000 mg | ORAL_TABLET | Freq: Three times a day (TID) | ORAL | 3 refills | Status: AC

## 2023-08-24 MED ORDER — ALBUTEROL SULFATE HFA 108 (90 BASE) MCG/ACT IN AERS: 2.0000 | INHALATION_SPRAY | Freq: Four times a day (QID) | RESPIRATORY_TRACT | 2 refills | Status: DC | PRN

## 2023-08-24 MED ORDER — ONETOUCH DELICA PLUS LANCET33G MISC: 8 refills | Status: AC

## 2023-08-24 MED ORDER — ONETOUCH VERIO VI STRP: ORAL_STRIP | 3 refills | Status: AC

## 2023-08-24 MED ORDER — TAMSULOSIN HCL 0.4 MG PO CAPS: 0.4000 mg | ORAL_CAPSULE | Freq: Every day | ORAL | 3 refills | Status: AC

## 2023-08-24 MED ORDER — CETIRIZINE HCL 10 MG PO TABS: 10.0000 mg | ORAL_TABLET | Freq: Every day | ORAL | 3 refills | Status: AC

## 2023-08-24 NOTE — Progress Notes (Signed)
 BP 130/68   Pulse 72   Ht 5' 7 (1.702 m)   Wt 235 lb (106.6 kg)   SpO2 94%   BMI 36.81 kg/m    Subjective:   Patient ID: Kenneth Fuller, male    DOB: [birth year], 78 y.o.   MRN: [id number]  HPI: Kenneth Fuller is a 78 y.o. male presenting on 08/24/2023 for Medical Management of Chronic Issues, Chronic Kidney Disease, Diabetes, Hypertension, and Hyperlipidemia   HPI Type 2 diabetes mellitus Patient comes in today for recheck of his diabetes. Patient has been currently taking Ozempic  and Farxiga . Patient is not currently on an ACE inhibitor/ARB. Patient has seen an ophthalmologist this year. Patient denies any new issues with their feet. The symptom started onset as an adult hypertension and hyperlipidemia and CAD and CHF and CKD ARE RELATED TO DM   Hypertension Patient is currently on bisoprolol  and furosemide  as needed and Imdur  and hydralazine , and their blood pressure today is 130/60. Patient denies any lightheadedness or dizziness. Patient denies headaches, blurred vision, chest pains, shortness of breath, or weakness. Denies any side effects from medication and is content with current medication.   Hyperlipidemia Patient is coming in for recheck of his hyperlipidemia. The patient is currently taking atorvastatin . They deny any issues with myalgias or history of liver damage from it. They deny any focal numbness or weakness or chest pain.   Relevant past medical, surgical, family and social history reviewed and updated as indicated. Interim medical history since our last visit reviewed. Allergies and medications reviewed and updated.  Review of Systems  Constitutional:  Negative for chills and fever.  Eyes:  Negative for visual disturbance.  Respiratory:  Negative for shortness of breath and wheezing.   Cardiovascular:  Negative for chest pain and leg swelling.  Musculoskeletal:  Negative for back pain and gait problem.  Skin:  Negative for rash.  Neurological:   Negative for dizziness and light-headedness.  All other systems reviewed and are negative.   Per HPI unless specifically indicated above   Allergies as of 08/24/2023       Reactions   Amlodipine  Besy-benazepril Hcl Swelling, Other (See Comments)   Makes tongue swell (lotrel)   Hydrocodone  Itching   Can tolerate in low doses   Oxycodone  Itching   Phenergan  [promethazine  Hcl] Other (See Comments)   I can't remember.        Medication List        Accurate as of August 24, 2023  8:45 AM. If you have any questions, ask your nurse or doctor.          albuterol  (2.5 MG/3ML) 0.083% nebulizer solution Commonly known as: PROVENTIL  Take 3 mLs (2.5 mg total) by nebulization every 6 (six) hours as needed for wheezing or shortness of breath.   albuterol  108 (90 Base) MCG/ACT inhaler Commonly known as: VENTOLIN  HFA Inhale 2 puffs into the lungs every 6 (six) hours as needed for wheezing or shortness of breath.   atorvastatin  40 MG tablet Commonly known as: LIPITOR Take 40 mg by mouth daily.   bisoprolol  5 MG tablet Commonly known as: ZEBETA  Take 1/2 (one-half) tablet by mouth once daily   cetirizine  10 MG tablet Commonly known as: ZYRTEC  Take 1 tablet (10 mg total) by mouth daily.   dapagliflozin  propanediol 10 MG Tabs tablet Commonly known as: Farxiga  Take 1 tablet (10 mg total) by mouth daily.   fluticasone  50 MCG/ACT nasal spray Commonly known as: FLONASE  Place 2 sprays into  both nostrils daily. What changed:  when to take this reasons to take this   furosemide  40 MG tablet Commonly known as: LASIX  Take 40 mg by mouth daily as needed (fluid retention.).   Gemtesa 75 MG Tabs Generic drug: Vibegron Take 75 mg by mouth daily.   hydrALAZINE  50 MG tablet Commonly known as: APRESOLINE  Take 1.5 tablets (75 mg total) by mouth 3 (three) times daily.   isosorbide  dinitrate 20 MG tablet Commonly known as: ISORDIL  Take 1 tablet (20 mg total) by mouth 3 (three) times  daily.   nitroGLYCERIN  0.4 MG SL tablet Commonly known as: NITROSTAT  Place 0.4 mg under the tongue every 5 (five) minutes as needed for chest pain.   omeprazole  40 MG capsule Commonly known as: PRILOSEC TAKE 1 CAPSULE BY MOUTH ONCE DAILY 30 TO 60 MINUTES BEFORE BREAKFAST   OneTouch Delica Plus Lancet33G Misc USE 1 LANCET TO CHECK GLUCOSE ONCE DAILY AS DIRECTED   OneTouch Verio Flex System w/Device Kit Use to test blood sugar daily as directed. DX: E11.65   OneTouch Verio test strip Generic drug: glucose blood test blood sugar daily as directed. DX: E11.65   OVER THE COUNTER MEDICATION Take 3 capsules by mouth daily. Super Beets   Ozempic  (0.25 or 0.5 MG/DOSE) 2 MG/3ML Sopn Generic drug: Semaglutide (0.25 or 0.5MG /DOS) Inject 0.5 mg into the skin once a week.   roflumilast  500 MCG Tabs tablet Commonly known as: DALIRESP  Take 1 tablet (500 mcg total) by mouth daily.   sertraline  100 MG tablet Commonly known as: ZOLOFT  Take 1 tablet (100 mg total) by mouth daily.   tamsulosin  0.4 MG Caps capsule Commonly known as: FLOMAX  Take 1 capsule (0.4 mg total) by mouth daily after supper.   Vitamin D3 125 MCG (5000 UT) Tabs Take 5,000 Units by mouth daily.         Objective:   BP 130/68   Pulse 72   Ht 5' 7 (1.702 m)   Wt 235 lb (106.6 kg)   SpO2 94%   BMI 36.81 kg/m   Wt Readings from Last 3 Encounters:  08/24/23 235 lb (106.6 kg)  07/07/23 230 lb (104.3 kg)  07/05/23 233 lb 8 oz (105.9 kg)    Physical Exam Vitals and nursing note reviewed.  Constitutional:      General: He is not in acute distress.    Appearance: He is well-developed. He is not diaphoretic.  Eyes:     General: No scleral icterus.    Conjunctiva/sclera: Conjunctivae normal.  Neck:     Thyroid : No thyromegaly.  Cardiovascular:     Rate and Rhythm: Normal rate and regular rhythm.     Heart sounds: Normal heart sounds. No murmur heard. Pulmonary:     Effort: Pulmonary effort is normal. No  respiratory distress.     Breath sounds: Normal breath sounds. No wheezing.  Musculoskeletal:        General: No swelling. Normal range of motion.     Cervical back: Neck supple.  Lymphadenopathy:     Cervical: No cervical adenopathy.  Skin:    General: Skin is warm and dry.     Findings: No rash.  Neurological:     Mental Status: He is alert and oriented to person, place, and time.     Coordination: Coordination normal.  Psychiatric:        Behavior: Behavior normal.       Assessment & Plan:   Problem List Items Addressed This Visit  Cardiovascular and Mediastinum   Essential hypertension   Relevant Medications   isosorbide  dinitrate (ISORDIL ) 20 MG tablet     Respiratory   Cough variant asthma   Relevant Medications   albuterol  (PROVENTIL ) (2.5 MG/3ML) 0.083% nebulizer solution   albuterol  (VENTOLIN  HFA) 108 (90 Base) MCG/ACT inhaler     Endocrine   Diabetes mellitus type II, non insulin  dependent (HCC)   Relevant Medications   glucose blood (ONETOUCH VERIO) test strip   Lancets (ONETOUCH DELICA PLUS LANCET33G) MISC   Hyperlipidemia associated with type 2 diabetes mellitus (HCC)   Relevant Medications   isosorbide  dinitrate (ISORDIL ) 20 MG tablet     Genitourinary   CKD (chronic kidney disease), stage III (HCC)   Relevant Orders   Bayer DCA Hb A1c Waived   CBC with Differential/Platelet   CMP14+EGFR   Lipid panel   Hgb A1c w/o eAG   Other Visit Diagnoses       Type 2 diabetes mellitus with diabetic polyneuropathy, without long-term current use of insulin  (HCC)    -  Primary   Relevant Orders   Bayer DCA Hb A1c Waived   CBC with Differential/Platelet   CMP14+EGFR   Lipid panel   Hgb A1c w/o eAG   Microalbumin/Creatinine Ratio, Urine     Chronic obstructive pulmonary disease with acute exacerbation (HCC)       Relevant Medications   albuterol  (PROVENTIL ) (2.5 MG/3ML) 0.083% nebulizer solution   albuterol  (VENTOLIN  HFA) 108 (90 Base) MCG/ACT  inhaler   cetirizine  (ZYRTEC ) 10 MG tablet     Patient will do A1c and blood work today and will watch for the results.,  For the most part has been feeling good although he would like to discuss weight loss a little bit more and would like to either switch to the Mounjaro or go up on the dose of the Ozempic .  Will get him back to the pharmacy for prescription assistance and see about adjustment.  Follow up plan: Return in about 3 months (around 11/24/2023), or if symptoms worsen or fail to improve, for Diabetes.  Counseling provided for all of the vaccine components Orders Placed This Encounter  Procedures   Bayer DCA Hb A1c Waived   CBC with Differential/Platelet   CMP14+EGFR   Lipid panel   Hgb A1c w/o eAG   Microalbumin/Creatinine Ratio, Urine    Fonda Levins, MD [facility] 08/24/2023, 8:45 AM

## 2023-08-25 LAB — MICROALBUMIN / CREATININE URINE RATIO
Creatinine, Urine: 143.5 mg/dL
Microalb/Creat Ratio: 55 mg/g{creat} — ABNORMAL HIGH (ref 0–29)
Microalbumin, Urine: 78.3 ug/mL

## 2023-08-29 ENCOUNTER — Encounter (HOSPITAL_BASED_OUTPATIENT_CLINIC_OR_DEPARTMENT_OTHER): Payer: Self-pay | Admitting: Pulmonary Disease

## 2023-08-29 ENCOUNTER — Ambulatory Visit (HOSPITAL_BASED_OUTPATIENT_CLINIC_OR_DEPARTMENT_OTHER): Admitting: Pulmonary Disease

## 2023-08-29 VITALS — BP 130/73 | HR 75 | Ht 67.0 in | Wt 236.0 lb

## 2023-08-29 DIAGNOSIS — Z9682 Presence of neurostimulator: Secondary | ICD-10-CM

## 2023-08-29 DIAGNOSIS — G4733 Obstructive sleep apnea (adult) (pediatric): Secondary | ICD-10-CM

## 2023-08-29 NOTE — Progress Notes (Signed)
 Subjective:    Patient ID: Kenneth Fuller, male    DOB: [birth year], 78 y.o.   MRN: [id number]   78  yo remote smoker for FU of asthma/ COPD and OSA    -Doubt he has significant COPD.  Variability in lung function seems to suggest asthma. Feels daliresp  really helped him subjectively    PMH - chronic diastolic heart failure coronary artery disease diabetes, stage3 CKD -asthma as a child , failed medical exam , recurred 2018          He smoked less than 10 pack years before he quit in 1971.  He reports lifelong history of asthma.  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    He was unable to tolerate CPAP. Underwent implantation of hypoglossal nerve stimulator device on 10/26/22   Activation 01/17/23 delayed  Sensation 1.6 V , functional level 1.7 V lower limit, upper limit 2.7 V   Start delay 30 minutes, pause time 15 minutes, duration 8 hours     02/2023 OV  could not tolerate 2.0 V and  backed down to 1.8 V  his sensation level  decreased to 1.3 V, we changed his range to 1.4 to 2.4 V and reduced his amplitude down to 1.7 V >> titration showed therapeutic amplitude of 2.7 V Which he could not reach, hence changed to electrode B configuration 06/2023    06/2023 OV post study >> could not tolerate high amp on elec A Elec B 0.6-1.0 >> set at 0.6 Elec C 0.6-0.8 poor tongue motion   Discussed the use of AI scribe software for clinical note transcription with the patient, who gave verbal consent to proceed.  History of Present Illness Kenneth Fuller is a 78 year old male with obstructive sleep apnea who presents for follow-up after stimulator implant adjustment.  He recently had his stimulator implant adjusted to electrode B configuration due to issues with electrode A at high amplitude. He is currently at level four/ 0.9 V and is comfortable with the new configuration. He is sleeping well, although he occasionally wakes up  to urinate. He experiences a sensation of his tongue responding to the stimulator, which can be surprising, but he is generally comfortable with the current settings.  He uses the pause feature on his device if he is not asleep within the initial 30-minute window or if he needs to get up during the night. His wife has no complaints regarding snoring, and he reports no breathing problems this spring.  He uses albuterol  for asthma management and reports no breathing issues this spring.     Significant tests/ events reviewed  06/2023 inspire titration 1.7-2.7 V >> optimal 2.7with AHI 6.6/hr  NPSG 06/2020 >> BiPAP 15/11 ,med FF mask 11/2015 NPSG  very severe OSA, AHI >100. Split night study showed optimal control with CPAP 5-20     PFTs 01/2021 no airway obstruction, moderate restriction ratio 74, FEV1 55%, FVC 53%, 12% bronchodilator response, FEV1 improved to 62%, DLCO 81%    12/2016 PFT >> ratio 84, severe restriction with reduced diffusion capacity.  Total lung capacity 72% FVC, 1.6 L / 40% and DLCO of 19.4/68%.   PFT 1/ 2019 showed FEV1 45%, ratio 68, FVC 48% consistent with moderate to severe airflow obstruction   Feno 01/26/2017 ->  8ppb     High resolution CT chest 01/2017 neg for ILD   CT angiogram chest  05/2020 clear lungs CT sinus March 09, 2017 clear sinuses  Review of Systems  neg for any significant sore throat, dysphagia, itching, sneezing, nasal congestion or excess/ purulent secretions, fever, chills, sweats, unintended wt loss, pleuritic or exertional cp, hempoptysis, orthopnea pnd or change in chronic leg swelling. Also denies presyncope, palpitations, heartburn, abdominal pain, nausea, vomiting, diarrhea or change in bowel or urinary habits, dysuria,hematuria, rash, arthralgias, visual complaints, headache, numbness weakness or ataxia.      Objective:   Physical Exam  Gen. Pleasant, obese, in no distress ENT - no lesions, no post nasal drip Neck: No JVD, no  thyromegaly, no carotid bruits Lungs: no use of accessory muscles, no dullness to percussion, decreased without rales or rhonchi  Cardiovascular: Rhythm regular, heart sounds  normal, no murmurs or gallops, no peripheral edema Musculoskeletal: No deformities, no cyanosis or clubbing , no tremors  Programming : studies stimulation at diff levels on elec B configuration     Assessment & Plan:   Assessment and Plan Assessment & Plan Obstructive Sleep Apnea (OSA) OSA managed with a stimulator implant. Recent change to electrode B configuration due inabilty to tolerate electrode A at high amplitude. Current setting at level 4 (0.9) appears optimal with improved sleep quality and reduced nocturia. No snoring reported. - Conduct a home sleep study in 3-4 weeks to confirm the effectiveness of the current stimulator setting. - Advise to maintain current stimulator level (0.9) and not to increase further. - Educate on using the pause feature of the stimulator for comfort if not asleep within 30 minutes or when getting up at night.  Type 2 Diabetes Mellitus Type 2 Diabetes Mellitus managed with Ozempic . Reports good control with recent A1c of 5.2 or 5.4. Some weight loss noted, though recent slight weight gain due to splurging.

## 2023-08-29 NOTE — Patient Instructions (Signed)
 X Home sleep study at current level - Kenneth Fuller with precautions

## 2023-08-31 ENCOUNTER — Telehealth: Payer: Self-pay

## 2023-08-31 NOTE — Progress Notes (Signed)
 Care Guide Pharmacy Note  08/31/2023 Name: Kenneth Fuller MRN: [id number] DOB: [birth year]  Referred By: Maryanne Fonda LABOR, MD Reason for referral: Complex Care Management (Outreach to schedule with Pharm d )   Kenneth Fuller is a 78 y.o. year old male who is a primary care patient of [name], Fonda LABOR, MD.  Kenneth Fuller was referred to the pharmacist for assistance related to: DMII  Successful contact was made with the patient to discuss pharmacy services including being ready for the pharmacist to call at least 5 minutes before the scheduled appointment time and to have medication bottles and any blood pressure readings ready for review. The patient agreed to meet with the pharmacist via telephone visit on (date/time).09/22/2023  Jeoffrey Buffalo , RMA     [place]  [facility], [facility] Guide  Direct Dial: [phone]  Website: [email or web address]

## 2023-09-01 ENCOUNTER — Ambulatory Visit: Payer: Self-pay | Admitting: Family Medicine

## 2023-09-15 DIAGNOSIS — E119 Type 2 diabetes mellitus without complications: Secondary | ICD-10-CM | POA: Diagnosis not present

## 2023-09-15 DIAGNOSIS — H40033 Anatomical narrow angle, bilateral: Secondary | ICD-10-CM | POA: Diagnosis not present

## 2023-09-22 ENCOUNTER — Telehealth: Payer: Self-pay | Admitting: Pharmacist

## 2023-09-22 ENCOUNTER — Other Ambulatory Visit

## 2023-09-22 DIAGNOSIS — E119 Type 2 diabetes mellitus without complications: Secondary | ICD-10-CM

## 2023-09-22 NOTE — Telephone Encounter (Signed)
 Can you please run copay for Mounjaro 5mg  weekly? Let me know If I need to send in RX to [facility] to make this happen We are trying to determine whether to proceed with ozempic  PAP vs send mounjaro to pharmacy.  I'm wondering if Ozempic  is covered under HTA as well Trying to avoid more PAP and fill if able Thank you!

## 2023-09-22 NOTE — Progress Notes (Signed)
 09/22/2023 Name: Kenneth Fuller MRN: [id number] DOB: 11-Jul-1945  Chief Complaint  Patient presents with   Diabetes    Kenneth Fuller is a 78 y.o. year old male who presented for a telephone visit.  I connected with  [name] on 09/22/23 by telephone and verified that I am speaking with the correct person using two identifiers.  I discussed the limitations of evaluation and management by telemedicine. The patient expressed understanding and agreed to proceed.  Patient was located in her home and PharmD in PCP office during this visit.    They were referred to the pharmacist by their PCP for assistance in managing diabetes and medication access.    Subjective:  Patient is interested in restarting a GLP1 for additional benefits.  He has been on Ozempic  in the past, however he is interested in Mounjaro .  His medications for diabetes and cardiac have been $0 copay through HTA medicare.   Care Team: Primary Care Provider: [name], Fonda LABOR, MD ; Next Scheduled Visit: n/a Pulm--11/25/23  Medication Access/Adherence  Current Pharmacy:  [facility] [address] [place] - [address] [place] [address] Phone: [phone] Fax: [phone]  [facility] - [place], [place] - [address] [place] [address] Phone: [phone] Fax: [phone]   Patient reports affordability concerns with their medications: No --HTA CSNP--all diabetes and cardiac medications are free Patient reports access/transportation concerns to their pharmacy: No  Patient reports adherence concerns with their medications:  No     Diabetes:  Current medications:  Farxiga  (dapagliflozin ) 10mg  daily  Medications tried in the past:  Invokana  (canagliflozin ) Jardiance  (empagliflozin ) Glimepiride  Glipizide  Metformin  Levemir  Januvia  (sitagliptin )  olmesartan , lisinopril  in the past Benazepril allergy  noted  Current glucose readings:  fbg<130 Using TRADITIONAL GLUCOMETER  Current meal patterns:  Discussed meal planning options and Plate method for healthy eating Avoid sugary drinks and desserts Incorporate balanced protein, non starchy veggies, 1 serving of carbohydrate with each meal Increase water intake Increase physical activity as able   Current medication access support: -HTA CSNP--all diabetes and cardiac medications are free Hyperlipidemia/ASCVD Risk Reduction  Current lipid lowering medications: atorvastatin  Medications tried in the past: n/a  Antiplatelet regimen: n/a  ASCVD History: noted, but unsure of event Family History:  Risk Factors: T2DM, CHF, HTN, HLD  Current physical activity: as able  Current medication access support: az&me PAP (Farxiga )  Clinical ASCVD: Yes  The ASCVD Risk score (Arnett DK, et al., 2019) failed to calculate for the following reasons:   Risk score cannot be calculated because patient has a medical history suggesting prior/existing ASCVD    Objective:  Lab Results  Component Value Date   HGBA1C 5.3 08/24/2023    Lab Results  Component Value Date   CREATININE 1.25 08/24/2023   BUN 21 08/24/2023   NA 140 08/24/2023   K 4.1 08/24/2023   CL 104 08/24/2023   CO2 20 08/24/2023    Lab Results  Component Value Date   CHOL 121 08/24/2023   HDL 37 (L) 08/24/2023   LDLCALC 58 08/24/2023   TRIG 150 (H) 08/24/2023   CHOLHDL 3.3 08/24/2023    Medications Reviewed Today     Reviewed by Billee Mliss JONETTA, [facility] (Pharmacist) on 10/12/23 at 1325  Med List Status: <None>   Medication Order Taking? Sig Documenting Provider Last Dose Status Informant  albuterol  (PROVENTIL ) (2.5 MG/3ML) 0.083% nebulizer solution [id number]  Take 3  mLs (2.5 mg total) by nebulization every 6 (six) hours as needed for wheezing or shortness of breath. [name], Fonda LABOR, MD  Active   albuterol  (VENTOLIN  HFA) 108 [phone] Base) MCG/ACT inhaler [id number]  Inhale 2 puffs into the lungs every 6 (six)  hours as needed for wheezing or shortness of breath. [name], Fonda LABOR, MD  Active   atorvastatin  (LIPITOR) 40 MG tablet [id number]  Take 40 mg by mouth daily. [provider]  Active   bisoprolol  (ZEBETA ) 5 MG tablet [id number]  Take 1/2 (one-half) tablet by mouth once daily [name], Fonda LABOR, MD  Active   Blood Glucose Monitoring Suppl (ONETOUCH VERIO FLEX SYSTEM) w/Device KIT [id number]  Use to test blood sugar daily as directed. DX: E11.65 [name], Fonda LABOR, MD  Active Self  cetirizine  (ZYRTEC ) 10 MG tablet [id number]  Take 1 tablet (10 mg total) by mouth daily. [name], Fonda LABOR, MD  Active   Cholecalciferol  (VITAMIN D3) 125 MCG (5000 UT) TABS [id number]  Take 5,000 Units by mouth daily. [provider]  Active Self  dapagliflozin  propanediol (FARXIGA ) 10 MG TABS tablet [id number]  Take 1 tablet (10 mg total) by mouth daily. [name], Fonda LABOR, MD  Active   fluticasone  (FLONASE ) 50 MCG/ACT nasal spray [id number]  Place 2 sprays into both nostrils daily.  Patient taking differently: Place 2 sprays into both nostrils daily as needed for allergies.   Lavell Lye A, FNP  Active Self  furosemide  (LASIX ) 40 MG tablet [id number]  Take 40 mg by mouth daily as needed (fluid retention.). [provider]  Active Self  GEMTESA 75 MG TABS [id number]  Take 75 mg by mouth daily. [provider]  Active Self  glucose blood (ONETOUCH VERIO) test strip [id number]  test blood sugar daily as directed. DX: E11.65 [name], Fonda LABOR, MD  Active   hydrALAZINE  (APRESOLINE ) 50 MG tablet [id number]  Take 1.5 tablets (75 mg total) by mouth 3 (three) times daily. Croitoru, Mihai, MD  Active   isosorbide  dinitrate (ISORDIL ) 20 MG tablet [id number]  Take 1 tablet (20 mg total) by mouth 3 (three) times daily. [name], Fonda LABOR, MD  Active   Lancets [facility] DELICA PLUS [place]) MISC [id number]  USE 1 LANCET TO CHECK GLUCOSE ONCE DAILY AS DIRECTED [name], Fonda LABOR, MD  Active    nitroGLYCERIN  (NITROSTAT ) 0.4 MG SL tablet [id number]  Place 0.4 mg under the tongue every 5 (five) minutes as needed for chest pain. [provider]  Active Self  omeprazole  (PRILOSEC) 40 MG capsule [id number]  TAKE 1 CAPSULE BY MOUTH ONCE DAILY 30 TO 60 MINUTES BEFORE BREAKFAST [name], Fonda LABOR, MD  Active   OVER THE COUNTER MEDICATION [id number]  Take 3 capsules by mouth daily. Super Beets [provider]  Active Self  roflumilast  (DALIRESP ) 500 MCG TABS tablet [id number]  Take 1 tablet (500 mcg total) by mouth daily. [name], Fonda LABOR, MD  Active   sertraline  (ZOLOFT ) 100 MG tablet [id number]  Take 1 tablet (100 mg total) by mouth daily. [name], Fonda LABOR, MD  Active   tamsulosin  (FLOMAX ) 0.4 MG CAPS capsule [id number]  Take 1 capsule (0.4 mg total) by mouth daily after supper. [name], Fonda LABOR, MD  Active   tirzepatide  (MOUNJARO ) 2.5 MG/0.5ML Pen [id number]  Inject 2.5 mg into the skin once a week. DX: E11.65 [name], Fonda LABOR, MD  Active              Assessment/Plan:   Diabetes: - Currently controlled; goal A1c <7%. Cardiorenal  risk reduction is optimized.. Blood pressure is at goal <130/80. LDL is at goal.  - Reviewed long term cardiovascular and renal outcomes of uncontrolled blood sugar. and Reviewed goal A1c, goal fasting, and goal 2 hour post prandial glucose. Recommended to check glucose FBG or when symptomatic - Recommend to start Mounjaro  2.5mg  weekly ., Recommend to continue Farxiga  10mg  daily. - Patient denies personal or family history of multiple endocrine neoplasia type 2, medullary thyroid  cancer; personal history of pancreatitis or gallbladder disease., Discussed side effects of gastrointestinal upset/nausea; eating smaller meals, avoiding high-fat foods, and remaining upright after eating may reduce nausea. Discussed that overeating is a major trigger of nausea with this class of medications, as often times patients will start to feel full sooner  and may need to decrease portion sizes from what they were previously accustomed to. , Discussed potential side effects of dehydration, genitourinary infections., Advised on sick day rules (if a day with significantly reduced oral intake, serious vomiting, or diarrhea, hold SGLT2), Encouraged adequate hydration and genital hygiene.   Hyperlipidemia/ASCVD Risk Reduction: - Currently controlled.  - Reviewed long term complications of uncontrolled cholesterol - Reviewed dietary recommendations including FOLLOWING A HEART HEALTHY DIET/HEALTHY PLATE METHOD - Reviewed lifestyle recommendations including. Increased physical activity as able - Recommend to continue atorvastatin  40mg  daily     Follow Up Plan: 3 weeks PharmD  Mliss Tarry Griffin, PharmD, BCACP, CPP Clinical Pharmacist, [facility]

## 2023-09-23 ENCOUNTER — Other Ambulatory Visit (HOSPITAL_COMMUNITY): Payer: Self-pay

## 2023-09-29 ENCOUNTER — Telehealth: Payer: Self-pay | Admitting: Pharmacist

## 2023-09-29 NOTE — Telephone Encounter (Signed)
 OZEMPIC  APPROVED--can you run copay and let me know cost? His card is scanned into chart for 2025 RX verify benefits wasn't pulling in his ID number Thank you!! Again, trying to avoid PAP

## 2023-09-30 ENCOUNTER — Telehealth: Payer: Self-pay | Admitting: Pharmacist

## 2023-09-30 ENCOUNTER — Other Ambulatory Visit (HOSPITAL_COMMUNITY): Payer: Self-pay

## 2023-09-30 ENCOUNTER — Encounter: Payer: Self-pay | Admitting: Pharmacist

## 2023-09-30 NOTE — Telephone Encounter (Signed)
 MOUNJARO  APPROVED

## 2023-10-05 ENCOUNTER — Encounter: Payer: Self-pay | Admitting: Pharmacist

## 2023-10-05 ENCOUNTER — Other Ambulatory Visit (INDEPENDENT_AMBULATORY_CARE_PROVIDER_SITE_OTHER): Payer: Self-pay | Admitting: Pharmacist

## 2023-10-05 DIAGNOSIS — E119 Type 2 diabetes mellitus without complications: Secondary | ICD-10-CM

## 2023-10-05 MED ORDER — TIRZEPATIDE 2.5 MG/0.5ML ~~LOC~~ SOAJ: 2.5000 mg | SUBCUTANEOUS | 0 refills | Status: DC

## 2023-10-05 NOTE — Progress Notes (Signed)
   Patient interest in starting GLP/GIP for additional benefits (T2DM, and obesity)  New start mounjaro  (PA approved by PharmD) Will start Mounjaro  2.5mg  weekly Continue all other medications as prescribed Monitor blood sugars  Continue to eat 3 healthy plate/balanced meals a day Continue 150 min of physical activity   3 week follow up with PharmD for Mounjaro  titration  Mliss Tarry Griffin, PharmD, BCACP, CPP Clinical Pharmacist, [facility]

## 2023-10-07 ENCOUNTER — Ambulatory Visit: Admitting: Family Medicine

## 2023-10-17 ENCOUNTER — Telehealth: Payer: Self-pay | Admitting: Family Medicine

## 2023-10-17 NOTE — Telephone Encounter (Signed)
 Spoke with the lab they have the orders in the folder there. Please make lab appointment.

## 2023-10-17 NOTE — Telephone Encounter (Signed)
 Copied from CRM [id number]. Topic: Clinical - Request for Lab/Test Order >> Oct 17, 2023  1:16 PM Ivette P wrote: Reason for CRM: PT called in about lab work for [employer], Gaynell RAMAN, MD, dont see the lab order,   Pt stated that provider sent electronically.   Pt would like to get scheduled for appt coming up on 09/15, pls reach out to pt.

## 2023-10-17 NOTE — Progress Notes (Unsigned)
 10/18/2023 Name: Kenneth Fuller MRN: [id number] DOB: 12-08-1945  Chief Complaint  Patient presents with   Diabetes    Kenneth Fuller is a 78 y.o. year old male who presented for a telephone visit.   They were referred to the pharmacist by their PCP for assistance in managing diabetes.    Subjective: Patient has received two injections of Mounjaro  (Tuesdays). He denies any GI discomfort or issues with Mounjaro . Otherwise, patient states that he is doing well. Denies symptoms of hypoglycemia.   Care Team: Primary Care Provider: [name], Fonda LABOR, MD ; Next Scheduled Visit: NO FOLLOW-UP VISIT SCHEDULED  Medication Access/Adherence  Current Pharmacy:  [facility] [address] [place] - [address] [place] [address] Phone: [phone] Fax: [phone]  [facility] - [place], [place] - [address] [place] [address] Phone: [phone] Fax: [phone]   Patient reports affordability concerns with their medications: No  Patient reports access/transportation concerns to their pharmacy: No  Patient reports adherence concerns with their medications:  No    Diabetes: Current medications: Farxiga  10 mg daily, Mounjaro  2.5 mg weekly (Tuesday) Medications tried in the past: Invokana , Jardiance , glimepiride , glipizide , metformin , Levemir , Januvia  Statin: atorvastatin  40 mg daily LDL of 58 on 08/24/23 ACEi/ARB: none, noted allergy  to benazepril (angioedema) UACR of 55 on 08/24/23 GFR >60   A1c of 5.3% on 08/24/23, decreased from 7.9% on 07/09/20  Patient denies hypoglycemic s/sx including dizziness, shakiness, sweating. Patient denies hyperglycemic symptoms including polyuria, polydipsia, polyphagia, nocturia, neuropathy, blurred vision.  Current meal patterns:  Discussed meal planning options and Plate method for healthy eating Avoid sugary drinks and desserts Incorporate balanced protein, non starchy veggies, 1  serving of carbohydrate with each meal Increase water intake Increase physical activity as able   Current physical activity: increase as able  Current medication access support:  HTA CSNP - all diabetes and cardiac medications (free) [employer]    Objective: Lab Results  Component Value Date   HGBA1C 5.3 08/24/2023   Lab Results  Component Value Date   CREATININE 1.25 08/24/2023   BUN 21 08/24/2023   NA 140 08/24/2023   K 4.1 08/24/2023   CL 104 08/24/2023   CO2 20 08/24/2023   Lab Results  Component Value Date   CHOL 121 08/24/2023   HDL 37 (L) 08/24/2023   LDLCALC 58 08/24/2023   TRIG 150 (H) 08/24/2023   CHOLHDL 3.3 08/24/2023   Medications Reviewed Today     Reviewed by Bernette Falling, RPH (Pharmacist) on 10/18/23 at 1450  Med List Status: <None>   Medication Order Taking? Sig Documenting Provider Last Dose Status Informant  albuterol  (PROVENTIL ) (2.5 MG/3ML) 0.083% nebulizer solution [id number]  Take 3 mLs (2.5 mg total) by nebulization every 6 (six) hours as needed for wheezing or shortness of breath. [name], Fonda LABOR, MD  Active   albuterol  (VENTOLIN  HFA) 108 (90 Base) MCG/ACT inhaler [id number]  Inhale 2 puffs into the lungs every 6 (six) hours as needed for wheezing or shortness of breath. [name], Fonda LABOR, MD  Active   atorvastatin  (LIPITOR) 40 MG tablet [id number]  Take 40 mg by mouth daily. [provider]  Active   bisoprolol  (ZEBETA ) 5 MG tablet [id number]  Take 1/2 (one-half) tablet by mouth once daily [name], Fonda LABOR, MD  Active   Blood Glucose Monitoring Suppl (ONETOUCH VERIO FLEX SYSTEM) w/Device KIT [id number]  Use to test blood sugar daily  as directed. DX: E11.65 [name], Fonda LABOR, MD  Active Self  cetirizine  (ZYRTEC ) 10 MG tablet [id number]  Take 1 tablet (10 mg total) by mouth daily. [name], Fonda LABOR, MD  Active   Cholecalciferol  (VITAMIN D3) 125 MCG (5000 UT) TABS [id number]  Take 5,000 Units by mouth daily. [provider]  Active Self  dapagliflozin  propanediol (FARXIGA ) 10 MG TABS tablet [id number]  Take 1 tablet (10 mg total) by mouth daily. [name], Fonda LABOR, MD  Active   fluticasone  (FLONASE ) 50 MCG/ACT nasal spray [id number]  Place 2 sprays into both nostrils daily.  Patient taking differently: Place 2 sprays into both nostrils daily as needed for allergies.   Lavell Lye A, FNP  Active Self  furosemide  (LASIX ) 40 MG tablet [id number]  Take 40 mg by mouth daily as needed (fluid retention.). [provider]  Active Self  GEMTESA 75 MG TABS [id number]  Take 75 mg by mouth daily. [provider]  Active Self  glucose blood (ONETOUCH VERIO) test strip [id number]  test blood sugar daily as directed. DX: E11.65 [name], Fonda LABOR, MD  Active   hydrALAZINE  (APRESOLINE ) 50 MG tablet [id number]  Take 1.5 tablets (75 mg total) by mouth 3 (three) times daily. Croitoru, Mihai, MD  Active   isosorbide  dinitrate (ISORDIL ) 20 MG tablet [id number]  Take 1 tablet (20 mg total) by mouth 3 (three) times daily. [name], Fonda LABOR, MD  Active   Lancets [facility] DELICA PLUS [place]) MISC [id number]  USE 1 LANCET TO CHECK GLUCOSE ONCE DAILY AS DIRECTED [name], Fonda LABOR, MD  Active   nitroGLYCERIN  (NITROSTAT ) 0.4 MG SL tablet [id number]  Place 0.4 mg under the tongue every 5 (five) minutes as needed for chest pain. [provider]  Active Self  omeprazole  (PRILOSEC) 40 MG capsule [id number]  TAKE 1 CAPSULE BY MOUTH ONCE DAILY 30 TO 60 MINUTES BEFORE BREAKFAST [name], Fonda LABOR, MD  Active   OVER THE COUNTER MEDICATION [id number]  Take 3 capsules by mouth daily. Super Beets [provider]  Active Self  roflumilast  (DALIRESP ) 500 MCG TABS tablet [id number]  Take 1 tablet (500 mcg total) by mouth daily. [name], Fonda LABOR, MD  Active   sertraline  (ZOLOFT ) 100 MG tablet [id number]  Take 1 tablet (100 mg total) by mouth daily. [name], Fonda LABOR, MD  Active   tamsulosin  (FLOMAX ) 0.4 MG  CAPS capsule [id number]  Take 1 capsule (0.4 mg total) by mouth daily after supper. [name], Fonda LABOR, MD  Active     Discontinued 10/18/23 1450 (Completed Course)             Assessment/Plan:  Diabetes: Currently controlled Goal of <7% Reviewed long term cardiovascular and renal outcomes of uncontrolled blood sugar Reviewed goal A1c, goal fasting, and goal 2 hour post prandial glucose Recommend to   Complete 4 week course of Mounjaro  2.5 mg weekly, then increase to Mounjaro  5 mg weekly Rx sent into pharmacy Continue Farxiga  Patient denies personal or family history of multiple endocrine neoplasia type 2, medullary thyroid  cancer; personal history of pancreatitis or gallbladder disease. Recommend to check glucose with Libre CGM sensor  Follow Up Plan:  PharmD: 3 weeks PCP: contacted administrative pool to schedule follow-up  Woodie Jock, PharmD PGY1 Pharmacy Resident   Mliss Tarry Griffin, PharmD, BCACP, CPP Clinical Pharmacist, [facility]

## 2023-10-18 ENCOUNTER — Encounter

## 2023-10-18 ENCOUNTER — Other Ambulatory Visit (INDEPENDENT_AMBULATORY_CARE_PROVIDER_SITE_OTHER)

## 2023-10-18 ENCOUNTER — Other Ambulatory Visit

## 2023-10-18 DIAGNOSIS — R809 Proteinuria, unspecified: Secondary | ICD-10-CM | POA: Diagnosis not present

## 2023-10-18 DIAGNOSIS — E211 Secondary hyperparathyroidism, not elsewhere classified: Secondary | ICD-10-CM | POA: Diagnosis not present

## 2023-10-18 DIAGNOSIS — E1142 Type 2 diabetes mellitus with diabetic polyneuropathy: Secondary | ICD-10-CM

## 2023-10-18 DIAGNOSIS — N189 Chronic kidney disease, unspecified: Secondary | ICD-10-CM | POA: Diagnosis not present

## 2023-10-18 DIAGNOSIS — G4733 Obstructive sleep apnea (adult) (pediatric): Secondary | ICD-10-CM

## 2023-10-18 DIAGNOSIS — D631 Anemia in chronic kidney disease: Secondary | ICD-10-CM | POA: Diagnosis not present

## 2023-10-18 DIAGNOSIS — Z9682 Presence of neurostimulator: Secondary | ICD-10-CM

## 2023-10-18 MED ORDER — TIRZEPATIDE 5 MG/0.5ML ~~LOC~~ SOAJ: 5.0000 mg | SUBCUTANEOUS | 0 refills | Status: DC

## 2023-10-24 DIAGNOSIS — E1129 Type 2 diabetes mellitus with other diabetic kidney complication: Secondary | ICD-10-CM | POA: Diagnosis not present

## 2023-10-24 DIAGNOSIS — N1831 Chronic kidney disease, stage 3a: Secondary | ICD-10-CM | POA: Diagnosis not present

## 2023-10-24 DIAGNOSIS — I5042 Chronic combined systolic (congestive) and diastolic (congestive) heart failure: Secondary | ICD-10-CM | POA: Diagnosis not present

## 2023-10-24 DIAGNOSIS — R809 Proteinuria, unspecified: Secondary | ICD-10-CM | POA: Diagnosis not present

## 2023-10-28 ENCOUNTER — Telehealth: Payer: Self-pay | Admitting: Pulmonary Disease

## 2023-10-28 DIAGNOSIS — G4733 Obstructive sleep apnea (adult) (pediatric): Secondary | ICD-10-CM | POA: Diagnosis not present

## 2023-10-28 NOTE — Telephone Encounter (Signed)
 Spoke with Kenneth Fuller and informed him per Dr. Jude to schedule the next available INSPIRE appointment for adjustments.Inspire device was on the night of the study. Appointment was rescheduled, and INSPIRE reps will be updated to attend. Nothing further needed at this time.

## 2023-10-28 NOTE — Telephone Encounter (Signed)
 Significant residual OSA still present , AHI 28/h Please confirm device was on night of study 9/9 Please make an appt on inspire day , next available to make adjustments

## 2023-11-01 DIAGNOSIS — M48062 Spinal stenosis, lumbar region with neurogenic claudication: Secondary | ICD-10-CM | POA: Diagnosis not present

## 2023-11-01 DIAGNOSIS — M79604 Pain in right leg: Secondary | ICD-10-CM | POA: Diagnosis not present

## 2023-11-01 DIAGNOSIS — M5416 Radiculopathy, lumbar region: Secondary | ICD-10-CM | POA: Diagnosis not present

## 2023-11-01 DIAGNOSIS — M545 Low back pain, unspecified: Secondary | ICD-10-CM | POA: Diagnosis not present

## 2023-11-12 ENCOUNTER — Other Ambulatory Visit: Payer: Self-pay | Admitting: Family Medicine

## 2023-11-12 DIAGNOSIS — E1142 Type 2 diabetes mellitus with diabetic polyneuropathy: Secondary | ICD-10-CM

## 2023-11-16 ENCOUNTER — Encounter: Payer: Self-pay | Admitting: Family Medicine

## 2023-11-16 ENCOUNTER — Ambulatory Visit (INDEPENDENT_AMBULATORY_CARE_PROVIDER_SITE_OTHER): Admitting: Family Medicine

## 2023-11-16 VITALS — BP 134/73 | HR 79 | Ht 67.0 in | Wt 227.0 lb

## 2023-11-16 DIAGNOSIS — E785 Hyperlipidemia, unspecified: Secondary | ICD-10-CM | POA: Diagnosis not present

## 2023-11-16 DIAGNOSIS — E1169 Type 2 diabetes mellitus with other specified complication: Secondary | ICD-10-CM

## 2023-11-16 DIAGNOSIS — I10 Essential (primary) hypertension: Secondary | ICD-10-CM | POA: Diagnosis not present

## 2023-11-16 DIAGNOSIS — Z23 Encounter for immunization: Secondary | ICD-10-CM | POA: Diagnosis not present

## 2023-11-16 DIAGNOSIS — N1831 Chronic kidney disease, stage 3a: Secondary | ICD-10-CM | POA: Diagnosis not present

## 2023-11-16 DIAGNOSIS — Z7984 Long term (current) use of oral hypoglycemic drugs: Secondary | ICD-10-CM | POA: Diagnosis not present

## 2023-11-16 DIAGNOSIS — E119 Type 2 diabetes mellitus without complications: Secondary | ICD-10-CM

## 2023-11-16 LAB — BAYER DCA HB A1C WAIVED: HB A1C (BAYER DCA - WAIVED): 5.1 % (ref 4.8–5.6)

## 2023-11-16 NOTE — Progress Notes (Addendum)
 BP 134/73   Pulse 79   Ht 5' 7 (1.702 m)   Wt 227 lb (103 kg)   SpO2 95%   BMI 35.55 kg/m    Subjective:   Patient ID: Kenneth Fuller, male    DOB: Dec 14, 1945, 78 y.o.   MRN: [id number]  HPI: Kenneth Fuller is a 78 y.o. male presenting on 11/16/2023 for Medical Management of Chronic Issues, Diabetes, Hypertension, and Chronic Kidney Disease   Discussed the use of AI scribe software for clinical note transcription with the patient, who gave verbal consent to proceed.  History of Present Illness   Kenneth Fuller is a 78 year old male with COPD and diabetes who presents for a recheck.  Chronic obstructive pulmonary disease (copd) symptoms and management - Breathing is stable without new or worsening symptoms - Continues Daliresp  for COPD management - Maintains regular follow-up with pulmonologist  Cardiac symptoms - No symptoms of fluid retention or peripheral edema - No chest pain, shortness of breath, or palpitations - Cardiac condition is stable  Diabetes mellitus management and complications - Glycemic control is effective with recent A1c described as 'really good' - Currently taking Farxiga  and administering a half injection of Mounjaro  - No adverse effects from diabetes medications  Ophthalmologic findings - Recent eye examination revealed a cataract in the left eye, not currently concerning - Bleeding behind both eyes consistent with diabetic retinopathy - Awaiting further information from ophthalmology regarding need for specialist referral  Renal and peripheral edema symptoms - No kidney pain - No swelling in the legs          Relevant past medical, surgical, family and social history reviewed and updated as indicated. Interim medical history since our last visit reviewed. Allergies and medications reviewed and updated.  Review of Systems  Constitutional:  Negative for chills and fever.  Eyes:  Negative for discharge and visual  disturbance.  Respiratory:  Negative for shortness of breath and wheezing.   Cardiovascular:  Negative for chest pain and leg swelling.  Musculoskeletal:  Negative for back pain and gait problem.  Skin:  Negative for rash.  Neurological:  Negative for dizziness and headaches.  All other systems reviewed and are negative.   Per HPI unless specifically indicated above   Allergies as of 11/16/2023       Reactions   Amlodipine  Besy-benazepril Hcl Swelling, Other (See Comments)   Makes tongue swell (lotrel)   Hydrocodone  Itching   Can tolerate in low doses   Oxycodone  Itching   Phenergan  [promethazine  Hcl] Other (See Comments)   I can't remember.        Medication List        Accurate as of November 16, 2023  1:53 PM. If you have any questions, ask your nurse or doctor.          albuterol  (2.5 MG/3ML) 0.083% nebulizer solution Commonly known as: PROVENTIL  Take 3 mLs (2.5 mg total) by nebulization every 6 (six) hours as needed for wheezing or shortness of breath.   albuterol  108 (90 Base) MCG/ACT inhaler Commonly known as: VENTOLIN  HFA Inhale 2 puffs into the lungs every 6 (six) hours as needed for wheezing or shortness of breath.   atorvastatin  40 MG tablet Commonly known as: LIPITOR Take 40 mg by mouth daily.   bisoprolol  5 MG tablet Commonly known as: ZEBETA  Take 1/2 (one-half) tablet by mouth once daily   cetirizine  10 MG tablet Commonly known as: ZYRTEC  Take 1 tablet (10 mg  total) by mouth daily.   dapagliflozin  propanediol 10 MG Tabs tablet Commonly known as: Farxiga  Take 1 tablet (10 mg total) by mouth daily.   fluticasone  50 MCG/ACT nasal spray Commonly known as: FLONASE  Place 2 sprays into both nostrils daily. What changed:  when to take this reasons to take this   furosemide  40 MG tablet Commonly known as: LASIX  Take 40 mg by mouth daily as needed (fluid retention.).   Gemtesa 75 MG Tabs Generic drug: Vibegron Take 75 mg by mouth daily.    hydrALAZINE  50 MG tablet Commonly known as: APRESOLINE  Take 1.5 tablets (75 mg total) by mouth 3 (three) times daily.   isosorbide  dinitrate 20 MG tablet Commonly known as: ISORDIL  Take 1 tablet (20 mg total) by mouth 3 (three) times daily.   Mounjaro  5 MG/0.5ML Pen Generic drug: tirzepatide  INJECT 1/2 (ONE-HALF) ML SUBCUTANEOUSLY  ONCE A WEEK   nitroGLYCERIN  0.4 MG SL tablet Commonly known as: NITROSTAT  Place 0.4 mg under the tongue every 5 (five) minutes as needed for chest pain.   omeprazole  40 MG capsule Commonly known as: PRILOSEC TAKE 1 CAPSULE BY MOUTH ONCE DAILY 30 TO 60 MINUTES BEFORE BREAKFAST   OneTouch Delica Plus Lancet33G Misc USE 1 LANCET TO CHECK GLUCOSE ONCE DAILY AS DIRECTED   OneTouch Verio Flex System w/Device Kit Use to test blood sugar daily as directed. DX: E11.65   OneTouch Verio test strip Generic drug: glucose blood test blood sugar daily as directed. DX: E11.65   OVER THE COUNTER MEDICATION Take 3 capsules by mouth daily. Super Beets   roflumilast  500 MCG Tabs tablet Commonly known as: DALIRESP  Take 1 tablet (500 mcg total) by mouth daily.   sertraline  100 MG tablet Commonly known as: ZOLOFT  Take 1 tablet (100 mg total) by mouth daily.   tamsulosin  0.4 MG Caps capsule Commonly known as: FLOMAX  Take 1 capsule (0.4 mg total) by mouth daily after supper.   Vitamin D3 125 MCG (5000 UT) Tabs Take 5,000 Units by mouth daily.         Objective:   BP 134/73   Pulse 79   Ht 5' 7 (1.702 m)   Wt 227 lb (103 kg)   SpO2 95%   BMI 35.55 kg/m   Wt Readings from Last 3 Encounters:  11/16/23 227 lb (103 kg)  08/29/23 236 lb (107 kg)  08/24/23 235 lb (106.6 kg)    Physical Exam Physical Exam   NECK: Thyroid  without lumps or nodules. CHEST: Lungs clear to auscultation bilaterally. CARDIOVASCULAR: Heart regular rate and rhythm, no murmurs. Peripheral pulses intact. ABDOMEN: No costovertebral angle tenderness. EXTREMITIES: No edema in  lower extremities.         Assessment & Plan:   Problem List Items Addressed This Visit       Cardiovascular and Mediastinum   Essential hypertension   Relevant Orders   Bayer DCA Hb A1c Waived   CBC with Differential/Platelet   CMP14+EGFR   Lipid panel     Endocrine   Diabetes mellitus type II, non insulin  dependent (HCC)   Hyperlipidemia associated with type 2 diabetes mellitus (HCC)   Relevant Orders   Bayer DCA Hb A1c Waived   CBC with Differential/Platelet   CMP14+EGFR   Lipid panel     Genitourinary   CKD (chronic kidney disease), stage III (HCC) - Primary   Relevant Orders   Bayer DCA Hb A1c Waived   CBC with Differential/Platelet   CMP14+EGFR   Lipid panel  Type 2 diabetes mellitus with diabetic retinopathy Diabetes well-managed with Farxiga  and Mounjaro . Recent A1c was good. Diabetic retinopathy with bleeding behind both eyes. - Obtain optometrist's notes regarding diabetic retinopathy. - Consider referral to ophthalmologist based on optometrist's notes.  Chronic obstructive pulmonary disease (COPD) COPD is well-controlled with Daliresp .  Essential hypertension Blood pressure is well-controlled.  Chronic kidney disease, stage 3a No specific changes in management during the visit.  Cataract, left eye Cataract in the left eye noted by the optometrist, not currently severe enough for surgical intervention.       A1c was 5.1 which is good.  Follow up plan: Return in about 3 months (around 02/16/2024), or if symptoms worsen or fail to improve, for Hypertension and diabetes recheck.  Counseling provided for all of the vaccine components Orders Placed This Encounter  Procedures   Bayer DCA Hb A1c Waived   CBC with Differential/Platelet   CMP14+EGFR   Lipid panel    Fonda Levins, MD [facility] 11/16/2023, 1:53 PM

## 2023-11-17 ENCOUNTER — Ambulatory Visit (HOSPITAL_BASED_OUTPATIENT_CLINIC_OR_DEPARTMENT_OTHER): Admitting: Pulmonary Disease

## 2023-11-17 ENCOUNTER — Encounter (HOSPITAL_BASED_OUTPATIENT_CLINIC_OR_DEPARTMENT_OTHER): Payer: Self-pay | Admitting: Pulmonary Disease

## 2023-11-17 VITALS — BP 95/53 | HR 79 | Ht 67.0 in | Wt 229.5 lb

## 2023-11-17 DIAGNOSIS — G4733 Obstructive sleep apnea (adult) (pediatric): Secondary | ICD-10-CM | POA: Diagnosis not present

## 2023-11-17 DIAGNOSIS — I10 Essential (primary) hypertension: Secondary | ICD-10-CM | POA: Diagnosis not present

## 2023-11-17 DIAGNOSIS — J45909 Unspecified asthma, uncomplicated: Secondary | ICD-10-CM

## 2023-11-17 DIAGNOSIS — Z4542 Encounter for adjustment and management of neuropacemaker (brain) (peripheral nerve) (spinal cord): Secondary | ICD-10-CM

## 2023-11-17 DIAGNOSIS — Z9682 Presence of neurostimulator: Secondary | ICD-10-CM | POA: Diagnosis not present

## 2023-11-17 LAB — CBC WITH DIFFERENTIAL/PLATELET
Basophils Absolute: 0.1 x10E3/uL (ref 0.0–0.2)
Basos: 1 %
EOS (ABSOLUTE): 0.3 x10E3/uL (ref 0.0–0.4)
Eos: 4 %
Hematocrit: 46.8 % (ref 37.5–51.0)
Hemoglobin: 15 g/dL (ref 13.0–17.7)
Immature Grans (Abs): 0 x10E3/uL (ref 0.0–0.1)
Immature Granulocytes: 0 %
Lymphocytes Absolute: 1.8 x10E3/uL (ref 0.7–3.1)
Lymphs: 24 %
MCH: 26.5 pg — ABNORMAL LOW (ref 26.6–33.0)
MCHC: 32.1 g/dL (ref 31.5–35.7)
MCV: 83 fL (ref 79–97)
Monocytes Absolute: 0.6 x10E3/uL (ref 0.1–0.9)
Monocytes: 8 %
Neutrophils Absolute: 4.6 x10E3/uL (ref 1.4–7.0)
Neutrophils: 63 %
Platelets: 209 x10E3/uL (ref 150–450)
RBC: 5.67 x10E6/uL (ref 4.14–5.80)
RDW: 15.5 % — ABNORMAL HIGH (ref 11.6–15.4)
WBC: 7.2 x10E3/uL (ref 3.4–10.8)

## 2023-11-17 LAB — CMP14+EGFR
ALT: 18 IU/L (ref 0–44)
AST: 21 IU/L (ref 0–40)
Albumin: 4.4 g/dL (ref 3.8–4.8)
Alkaline Phosphatase: 87 IU/L (ref 47–123)
BUN/Creatinine Ratio: 14 (ref 10–24)
BUN: 21 mg/dL (ref 8–27)
Bilirubin Total: 0.5 mg/dL (ref 0.0–1.2)
CO2: 24 mmol/L (ref 20–29)
Calcium: 10.5 mg/dL — ABNORMAL HIGH (ref 8.6–10.2)
Chloride: 100 mmol/L (ref 96–106)
Creatinine, Ser: 1.45 mg/dL — ABNORMAL HIGH (ref 0.76–1.27)
Globulin, Total: 2.3 g/dL (ref 1.5–4.5)
Glucose: 89 mg/dL (ref 70–99)
Potassium: 5 mmol/L (ref 3.5–5.2)
Sodium: 139 mmol/L (ref 134–144)
Total Protein: 6.7 g/dL (ref 6.0–8.5)
eGFR: 50 mL/min/1.73 — ABNORMAL LOW (ref >59)

## 2023-11-17 LAB — LIPID PANEL
Chol/HDL Ratio: 3.5 ratio (ref 0.0–5.0)
Cholesterol, Total: 152 mg/dL (ref 100–199)
HDL: 44 mg/dL (ref >39)
LDL Chol Calc (NIH): 88 mg/dL (ref 0–99)
Triglycerides: 109 mg/dL (ref 0–149)
VLDL Cholesterol Cal: 20 mg/dL (ref 5–40)

## 2023-11-17 NOTE — Progress Notes (Signed)
 Subjective:    Patient ID: Kenneth Fuller, male    DOB: 10-22-1945, 78 y.o.   MRN: [id number]   78  yo remote smoker for FU of asthma/ COPD and OSA    -Doubt he has significant COPD.  Variability in lung function seems to suggest asthma. Feels daliresp  really helped him subjectively    PMH - chronic diastolic heart failure coronary artery disease diabetes, stage3 CKD -asthma as a child , failed medical exam , recurred 2018          He smoked less than 10 pack years before he quit in 1971.  He reports lifelong history of asthma.  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    He was unable to tolerate CPAP. Underwent implantation of hypoglossal nerve stimulator device on 10/26/22   Activation 01/17/23 delayed  Sensation 1.6 V , functional level 1.7 V lower limit, upper limit 2.7 V   Start delay 30 minutes, pause time 15 minutes, duration 8 hours     02/2023 OV  could not tolerate 2.0 V and  backed down to 1.8 V  his sensation level  decreased to 1.3 V, we changed his range to 1.4 to 2.4 V and reduced his amplitude down to 1.7 V >> titration showed therapeutic amplitude of 2.7 V Which he could not reach, hence changed to electrode B configuration 06/2023      06/2023 OV post study >> could not tolerate high amp on elec A Elec B 0.6-1.0 >> set at 0.6 Elec C 0.6-0.8 poor tongue motion   08/2023 incoming elec B 0.9V   Discussed the use of AI scribe software for clinical note transcription with the patient, who gave verbal consent to proceed.  History of Present Illness Kenneth Fuller Donnie is a 78 year old male with sleep apnea who presents for follow-up regarding his sleep therapy device settings.  He uses his sleep therapy device nightly, averaging seventy hours per week, but has not synced it to his phone since September 9th. Apneic events have decreased from sixty to seventy per hour to about twenty-eight per hour. Initially,  he was on electrode A with a setting of 2.7 but could not tolerate it and was switched to electrode B, currently set at 0.9. He perceives a slight pulse from the device, which is less strong than before.  He denies dizziness, coughing, or wheezing and has not used his inhaler much. He takes Zebutal, isosorbide , and hydralazine , primarily in the morning. He received a flu shot yesterday and is unsure of its effects. No significant changes in asthma symptoms are noted, and fall weather has not impacted his breathing.     Significant tests/ events reviewed    HST (alice) 10/2023  on elec B 0.9 V >> significant residual OSA still present , AHI 28/h   06/2023 inspire titration 1.7-2.7 V >> optimal 2.7with AHI 6.6/hr  NPSG 06/2020 >> BiPAP 15/11 ,med FF mask 11/2015 NPSG  very severe OSA, AHI >100. Split night study showed optimal control with CPAP 5-20     PFTs 01/2021 no airway obstruction, moderate restriction ratio 74, FEV1 55%, FVC 53%, 12% bronchodilator response, FEV1 improved to 62%, DLCO 81%    12/2016 PFT >> ratio 84, severe restriction with reduced diffusion capacity.  Total lung capacity 72% FVC, 1.6 L / 40% and DLCO of 19.4/68%.   PFT 1/ 2019 showed FEV1 45%, ratio  68, FVC 48% consistent with moderate to severe airflow obstruction   Feno 01/26/2017 ->  8ppb     High resolution CT chest 01/2017 neg for ILD   CT angiogram chest 05/2020 clear lungs CT sinus March 09, 2017 clear sinuses   Review of Systems  neg for any significant sore throat, dysphagia, itching, sneezing, nasal congestion or excess/ purulent secretions, fever, chills, sweats, unintended wt loss, pleuritic or exertional cp, hempoptysis, orthopnea pnd or change in chronic leg swelling. Also denies presyncope, palpitations, heartburn, abdominal pain, nausea, vomiting, diarrhea or change in bowel or urinary habits, dysuria,hematuria, rash, arthralgias, visual complaints, headache, numbness weakness or ataxia.       Objective:   Physical Exam  Gen. Pleasant, obese, in no distress ENT - no lesions, no post nasal drip Neck: No JVD, no thyromegaly, no carotid bruits Lungs: no use of accessory muscles, no dullness to percussion, decreased without rales or rhonchi  Cardiovascular: Rhythm regular, heart sounds  normal, no murmurs or gallops, no peripheral edema Musculoskeletal: No deformities, no cyanosis or clubbing , no tremors  Tongue stimulation checked with programmer at multiple levels on elec B      Assessment & Plan:   Assessment and Plan Assessment & Plan Obstructive sleep apnea Obstructive sleep apnea with improvement from 60-70 events per hour to 28 events per hour. Current treatment involves a remote-controlled device with electrode B at 0.9. He is tolerating the current level well, but further adjustments are needed to reduce events to 15 per hour. The device's stimulation appears less effective than before, possibly due to changes in impedance or electrode positioning. Compliance report reviewed - Increase electrode B level from 0.9 to 1.0 and then to 1.4 as tolerated. Range increased 0.8-1.4 - Instruct to increase the device level weekly, starting from level 3, aiming to reach level 5 if tolerated. - Schedule a sleep study at the sleep center in six weeks to evaluate multiple levels on electrode B. - Sync the device with the app weekly to ensure data is uploaded.  Hypertension Blood pressure is currently on the lower side. He takes isosorbide  and hydralazine  three times a day, which may affect blood pressure. No symptoms of dizziness reported. - Recheck blood pressure before leaving the clinic. - Consider holding one dose of isosorbide  or hydralazine  if blood pressure remains low.  Asthma Asthma is currently well-controlled. No recent exacerbations, coughing, or wheezing reported.

## 2023-11-17 NOTE — Patient Instructions (Signed)
 We increased level on elec B today  Level 3 = 1. 0 V Increase by 1 level every 2 week until tongue discomfort  Try to use every night    Lab study in 6 weeks

## 2023-11-23 ENCOUNTER — Ambulatory Visit: Payer: Self-pay | Admitting: Family Medicine

## 2023-11-25 ENCOUNTER — Ambulatory Visit: Admitting: Adult Health

## 2023-11-28 NOTE — Progress Notes (Unsigned)
 11/29/2023 Name: Kenneth Fuller MRN: [id number] DOB: 1945/10/03  Chief Complaint  Patient presents with   Diabetes    Kenneth Fuller is a 78 y.o. year old male who presented for a telephone visit. They were referred to the pharmacist by their PCP for assistance in managing diabetes and medication access.    Subjective: Kenneth Fuller presents for his follow-up visit regarding medication access and diabetes. He is tolerating Mounjaro  5 mg daily. He has administered a total of three injections since his dose titration. He has three unopened boxes of Mounjaro  5 mg in his refrigerator at this time. Since starting Mounjaro , he has noticed a weight loss, with his most recent weight being 223 lb.   When asked about his cholesterol medication, atorvastatin , he is unsure as to why he stopped taking it. His last fill for the medication was in 2022, and upon chart review, I could not identify a reason for his discontinuation, as it still appears in his medication list. Will restart atorvastatin  at 80 mg for better LDL control considering his history of ASCVD. He is concerned about the correctness of his medication profile and is interested in coming in person so that he can bring his medications in. Scheduled a follow-up in January for an in-person visit so that he is able to bring in his medications for a complete medication review.    Care Team: Primary Care Provider: [name], Fonda LABOR, MD ; Next Scheduled Visit: 02/16/2024  Medication Access/Adherence Current Pharmacy:  [facility] [address] [place] - [address] [place] [address] Phone: [phone] Fax: [phone]  [facility] - [place], [place] - [address] [place] [address] Phone: [phone] Fax: [phone]  Patient reports affordability concerns with their medications: No  Patient reports access/transportation concerns to their pharmacy: No  Patient reports adherence  concerns with their medications:  No    Diabetes: Current medications: Farxiga  10 mg daily, Mounjaro  5 mg weekly Medications tried in the past: Ozempic , glimepiride , glipizide  Statin: atorvastatin  40 mg daily (LF 2022) LDL of 88 on 11/16/23 ACEi/ARB: none, history of angioedema on Lotrel and soft blood pressure readings UACR of 55 on 08/24/23  A1c of 5.1% on 11/16/23, decreased from 5.3% on 08/24/23  Patient denies hypoglycemic s/sx including dizziness, shakiness, sweating. Patient denies hyperglycemic symptoms including polyuria, polydipsia, polyphagia, nocturia, neuropathy, blurred vision.  Current meal patterns:  Discussed meal planning options and Plate method for healthy eating Avoid sugary drinks and desserts Incorporate balanced protein, non starchy veggies, 1 serving of carbohydrate with each meal Increase water intake Increase physical activity as able   Objective: Lab Results  Component Value Date   HGBA1C 5.1 11/16/2023   Lab Results  Component Value Date   CREATININE 1.45 (H) 11/16/2023   BUN 21 11/16/2023   NA 139 11/16/2023   K 5.0 11/16/2023   CL 100 11/16/2023   CO2 24 11/16/2023   Lab Results  Component Value Date   CHOL 152 11/16/2023   HDL 44 11/16/2023   LDLCALC 88 11/16/2023   TRIG 109 11/16/2023   CHOLHDL 3.5 11/16/2023   Medications Reviewed Today     Reviewed by Bernette Falling, [facility] (Pharmacist) on 11/29/23 at 1316  Med List Status: <None>   Medication Order Taking? Sig Documenting Provider Last Dose Status Informant  albuterol  (PROVENTIL ) (2.5 MG/3ML) 0.083% nebulizer solution [id number]  Take 3 mLs (2.5 mg total) by nebulization every 6 (  six) hours as needed for wheezing or shortness of breath. [name], Fonda LABOR, MD  Active   albuterol  (VENTOLIN  HFA) 108 (90 Base) MCG/ACT inhaler [id number]  Inhale 2 puffs into the lungs every 6 (six) hours as needed for wheezing or shortness of breath. [name], Fonda LABOR, MD  Active     Discontinued  11/29/23 1305 (Change in therapy)   bisoprolol  (ZEBETA ) 5 MG tablet [id number]  Take 1/2 (one-half) tablet by mouth once daily [name], Fonda LABOR, MD  Active   Blood Glucose Monitoring Suppl (ONETOUCH VERIO FLEX SYSTEM) w/Device KIT [id number]  Use to test blood sugar daily as directed. DX: E11.65 [name], Fonda LABOR, MD  Active Self  cetirizine  (ZYRTEC ) 10 MG tablet [id number]  Take 1 tablet (10 mg total) by mouth daily. [name], Fonda LABOR, MD  Active   Cholecalciferol  (VITAMIN D3) 125 MCG (5000 UT) TABS [id number]  Take 5,000 Units by mouth daily. [provider]  Active Self  dapagliflozin  propanediol (FARXIGA ) 10 MG TABS tablet [id number]  Take 1 tablet (10 mg total) by mouth daily. [name], Fonda LABOR, MD  Active   fluticasone  (FLONASE ) 50 MCG/ACT nasal spray [id number]  Place 2 sprays into both nostrils daily.  Patient taking differently: Place 2 sprays into both nostrils daily as needed for allergies.   Lavell Bari LABOR, FNP  Active Self  furosemide  (LASIX ) 40 MG tablet [id number]  Take 40 mg by mouth daily as needed (fluid retention.). [provider]  Active Self  GEMTESA 75 MG TABS [id number]  Take 75 mg by mouth daily. [provider]  Active Self  glucose blood (ONETOUCH VERIO) test strip [id number]  test blood sugar daily as directed. DX: E11.65 [name], Fonda LABOR, MD  Active   hydrALAZINE  (APRESOLINE ) 50 MG tablet [id number]  Take 1.5 tablets (75 mg total) by mouth 3 (three) times daily. Croitoru, Mihai, MD  Active   isosorbide  dinitrate (ISORDIL ) 20 MG tablet [id number]  Take 1 tablet (20 mg total) by mouth 3 (three) times daily. [name], Fonda LABOR, MD  Active   Lancets [facility] DELICA PLUS [place]) MISC [id number]  USE 1 LANCET TO CHECK GLUCOSE ONCE DAILY AS DIRECTED [name], Fonda LABOR, MD  Active   nitroGLYCERIN  (NITROSTAT ) 0.4 MG SL tablet [id number]  Place 0.4 mg under the tongue every 5 (five) minutes as needed for chest pain. [provider]  Active  Self  omeprazole  (PRILOSEC) 40 MG capsule [id number]  TAKE 1 CAPSULE BY MOUTH ONCE DAILY 30 TO 60 MINUTES BEFORE BREAKFAST [name], Fonda LABOR, MD  Active   OVER THE COUNTER MEDICATION [id number]  Take 3 capsules by mouth daily. Super Beets [provider]  Active Self  roflumilast  (DALIRESP ) 500 MCG TABS tablet [id number]  Take 1 tablet (500 mcg total) by mouth daily. [name], Fonda LABOR, MD  Active   sertraline  (ZOLOFT ) 100 MG tablet [id number]  Take 1 tablet (100 mg total) by mouth daily. [name], Fonda LABOR, MD  Active   tamsulosin  (FLOMAX ) 0.4 MG CAPS capsule [id number]  Take 1 capsule (0.4 mg total) by mouth daily after supper. [name], Fonda LABOR, MD  Active   tirzepatide  (MOUNJARO ) 5 MG/0.5ML Pen [id number]  INJECT 1/2 (ONE-HALF) ML SUBCUTANEOUSLY  ONCE A WEEK [name], Fonda LABOR, MD  Active             Assessment/Plan:  Diabetes: Currently controlled. Goal of <7% Cardiorenal risk reduction is opportunities for improvement. Blood pressure is at goal of <130/80 mmHg LDL is not at goal of <70 mg/dL (history  of ASCVD)  Reviewed long term cardiovascular and renal outcomes of uncontrolled blood sugar Reviewed goal A1c, goal fasting, and goal 2 hour post prandial glucose Recommend to:  Continue Farxiga  10 mg daily Continue Mounjaro  5 mg weekly Will consider titration to 7.5 mg in January 2026.  START Atorvastatin  80 mg daily Prescription sent into [facility] Recommend to check glucose daily  Follow Up Plan:  PharmD: 02/21/2024 Will plan to complete a medication review with the patient in-person PCP: 02/16/2024  Woodie Jock, PharmD PGY1 Pharmacy Resident  11/29/2023  Mliss Tarry Griffin, PharmD, BCACP, CPP Clinical Pharmacist, [facility]

## 2023-11-29 ENCOUNTER — Encounter

## 2023-11-29 ENCOUNTER — Other Ambulatory Visit

## 2023-11-29 VITALS — Wt 223.0 lb

## 2023-11-29 DIAGNOSIS — E785 Hyperlipidemia, unspecified: Secondary | ICD-10-CM

## 2023-11-29 DIAGNOSIS — E1169 Type 2 diabetes mellitus with other specified complication: Secondary | ICD-10-CM

## 2023-11-29 MED ORDER — ATORVASTATIN CALCIUM 80 MG PO TABS: 80.0000 mg | ORAL_TABLET | Freq: Every day | ORAL | 3 refills | Status: AC

## 2023-12-10 ENCOUNTER — Other Ambulatory Visit: Payer: Self-pay | Admitting: *Deleted

## 2023-12-10 DIAGNOSIS — N1832 Chronic kidney disease, stage 3b: Secondary | ICD-10-CM

## 2023-12-10 DIAGNOSIS — E1142 Type 2 diabetes mellitus with diabetic polyneuropathy: Secondary | ICD-10-CM

## 2023-12-18 ENCOUNTER — Other Ambulatory Visit: Payer: Self-pay | Admitting: *Deleted

## 2023-12-19 ENCOUNTER — Ambulatory Visit: Payer: PPO

## 2023-12-19 VITALS — BP 95/53 | HR 79 | Ht 67.0 in | Wt 229.0 lb

## 2023-12-19 DIAGNOSIS — Z0000 Encounter for general adult medical examination without abnormal findings: Secondary | ICD-10-CM

## 2023-12-19 NOTE — Progress Notes (Signed)
 Subjective:   Kenneth Fuller is a 78 y.o. male who presents for a Medicare Annual Wellness Visit. I connected with  [name] on 12/19/23 by a audio enabled telemedicine application and verified that I am speaking with the correct person using two identifiers.  Patient Location: Home  Provider Location: Office/Clinic  Persons Participating in Visit: Patient.  I discussed the limitations of evaluation and management by telemedicine. The patient expressed understanding and agreed to proceed.   Vital Signs: Because this visit was a virtual/telehealth visit, some criteria may be missing or patient reported. Any vitals not documented were not able to be obtained and vitals that have been documented are patient reported.   If you're able to add these things as option in the [employer], we won't need it ... but for those who refuse to use the template, I guess it does need to be updated     Allergies (verified) Amlodipine  besy-benazepril hcl, Hydrocodone , Oxycodone , and Phenergan  [promethazine  hcl]   History: Past Medical History:  Diagnosis Date   Allergy     Anxiety    Asthma    CAD (coronary artery disease)    CHF (congestive heart failure) (HCC)    COPD (chronic obstructive pulmonary disease) (HCC)    Depression    Diabetes mellitus    Fibromyalgia    GERD (gastroesophageal reflux disease)    GI bleeding    Gout    Hyperlipidemia    Hypertension    Hypogonadism male    Insomnia    MRSA cellulitis    Neuropathy    Obesity    Shortness of breath dyspnea    with exertion    Sleep apnea    does not use CPAP   Wheezing    no asthma diagnosis   Past Surgical History:  Procedure Laterality Date   BACK SURGERY     BIOPSY  12/20/2019   Procedure: BIOPSY;  Surgeon: Teressa Toribio SQUIBB, MD;  Location: [facility];  Service: Endoscopy;;   CARDIAC CATHETERIZATION     COLONOSCOPY     COLONOSCOPY WITH PROPOFOL  N/A 12/20/2019   Procedure:  COLONOSCOPY WITH PROPOFOL ;  Surgeon: Teressa Toribio SQUIBB, MD;  Location: [facility];  Service: Endoscopy;  Laterality: N/A;   DRUG INDUCED ENDOSCOPY N/A 07/21/2022   Procedure: DRUG INDUCED ENDOSCOPY;  Surgeon: Jude Harden GAILS, MD;  Location: [facility] END[facility]COPY;  Service: Pulmonary;  Laterality: N/A;   ESOPHAGOGASTRODUODENOSCOPY (EGD) WITH PROPOFOL  N/A 12/20/2019   Procedure: ESOPHAGOGASTRODUODENOSCOPY (EGD) WITH PROPOFOL ;  Surgeon: Teressa Toribio SQUIBB, MD;  Location: [facility];  Service: Endoscopy;  Laterality: N/A;   IMPLANTATION OF HYPOGLOSSAL NERVE STIMULATOR Right 10/26/2022   Procedure: IMPLANTATION OF HYPOGLOSSAL NERVE STIMULATOR;  Surgeon: Carlie Clark, MD;  Location: [facility];  Service: ENT;  Laterality: Right;   LEFT HEART CATH AND CORONARY ANGIOGRAPHY N/A 12/02/2016   Procedure: LEFT HEART CATH AND CORONARY ANGIOGRAPHY;  Surgeon: Dann Candyce RAMAN, MD;  Location: [facility] INVASIVE CV LAB;  Service: Cardiovascular;  Laterality: N/A;   LUMBAR LAMINECTOMY/DECOMPRESSION MICRODISCECTOMY N/A 01/02/2014   Procedure: CENTRAL DECOMPRESSION LUMBAR LAMINECTOMY L3-L4, L4-L5;  Surgeon: Tanda DELENA Heading, MD;  Location: [facility];  Service: Orthopedics;  Laterality: N/A;   neck fusion     POLYPECTOMY  12/20/2019   Procedure: POLYPECTOMY;  Surgeon: Teressa Toribio SQUIBB, MD;  Location: [facility];  Service: Endoscopy;;   Family History  Problem Relation Age of Onset   Colon cancer Mother    Diabetes Father  siblings   Heart disease Father        brother   Heart attack Father    Kidney disease Sister    Heart failure Sister    Drug abuse Sister    Heart disease Brother    Heart disease Brother    Deep vein thrombosis Brother    Heart attack Son 50   Colon polyps Neg Hx    Migraines Neg Hx    Social History   Occupational History   Occupation: retired/disability 1999    Employer: DISABLED    Comment: [employer]  Tobacco Use   Smoking status: Former    Current packs/day: 0.00     Average packs/day: 1 pack/day for 5.0 years (5.0 ttl pk-yrs)    Types: Cigarettes    Start date: 02/09/1964    Quit date: 02/08/1969    Years since quitting: 54.8   Smokeless tobacco: Former    Quit date: 1971  [employer]   Vaping status: Never Used  Substance and Sexual Activity   Alcohol use: No    Alcohol/week: 0.0 standard drinks of alcohol   Drug use: No   Sexual activity: Not Currently   Tobacco Counseling Counseling given: Yes  SDOH Screenings   Food Insecurity: No Food Insecurity (12/16/2022)  Housing: Low Risk  (12/16/2022)  Transportation Needs: No Transportation Needs (12/16/2022)  Utilities: Not At Risk (12/16/2022)  Alcohol Screen: Low Risk  (12/16/2022)  Depression (PHQ2-9): Low Risk  (11/16/2023)  Financial Resource Strain: Low Risk  (12/16/2022)  Physical Activity: Inactive (12/16/2022)  Social Connections: Moderately Integrated (12/16/2022)  Stress: No Stress Concern Present (12/16/2022)  Tobacco Use: Medium Risk (12/19/2023)  Health Literacy: Adequate Health Literacy (12/16/2022)   Depression Screen    11/16/2023    1:28 PM 08/24/2023    8:11 AM 07/07/2023    3:54 PM 06/15/2023    9:51 AM 06/02/2023    1:47 PM 03/28/2023   10:41 AM 12/23/2022   10:58 AM  PHQ 2/9 Scores  PHQ - 2 Score 0 0 0 0 2 2 0  PHQ- 9 Score   0  0   8  2      Data saved with a previous flowsheet row definition      Goals Addressed   None    Visit info / Clinical Intake: Medicare Wellness Visit Type:: Subsequent Annual Wellness Visit Persons participating in visit:: patient Medicare Wellness Visit Mode:: Telephone If telephone:: video declined Because this visit was a virtual/telehealth visit:: vitals recorded from last visit If Telephone or Video please confirm:: I connected with the patient using audio enabled telemedicine application and verified that I am speaking with the correct person using two identifiers Patient Location:: home Provider Location:: office Information given by::  patient Interpreter Needed?: No Pre-visit prep was completed: yes AWV questionnaire completed by patient prior to visit?: no Living arrangements:: lives with spouse/significant other Patient's Overall Health Status Rating: very good Typical amount of pain: none Does pain affect daily life?: no Are you currently prescribed opioids?: no  Dietary Habits and Nutritional Risks How many meals a day?: 2 Eats fruit and vegetables daily?: yes Most meals are obtained by: preparing own meals In the last 2 weeks, have you had any of the following?: none Diabetic:: (!) yes Any non-healing wounds?: no How often do you check your BS?: as needed Would you like to be referred to a Nutritionist or for Diabetic Management? : no  Functional Status Activities of Daily Living (to include ambulation/medication):  Independent Ambulation: Independent Medication Administration: Independent Home Management: Independent Manage your own finances?: (!) no Primary transportation is: driving Concerns about hearing?: no  Fall Screening Falls in the past year?: 0 Number of falls in past year: 0 Was there an injury with Fall?: 0 Fall Risk Category Calculator: 0 Patient Fall Risk Level: Low Fall Risk  Fall Risk Patient at Risk for Falls Due to: No Fall Risks Fall risk Follow up: Falls evaluation completed; Education provided  Home and Transportation Safety: All rugs have non-skid backing?: yes All stairs or steps have railings?: yes Grab bars in the bathtub or shower?: yes Have non-skid surface in bathtub or shower?: (!) no Good home lighting?: yes Regular seat belt use?: yes Hospital stays in the last year:: no  Cognitive Assessment Difficulty concentrating, remembering, or making decisions? : no Will 6CIT or Mini Cog be Completed: yes What year is it?: 0 points What month is it?: 0 points Give patient an address phrase to remember (5 components): [address] [place], [place] About what time is it?: 0  points Count backwards from 20 to 1: 0 points Say the months of the year in reverse: 0 points Repeat the address phrase from earlier: 0 points 6 CIT Score: 0 points  Advance Directives (For Healthcare) Does Patient Have a Medical Advance Directive?: No Would patient like information on creating a medical advance directive?: Yes (MAU/Ambulatory/Procedural Areas - Information given)  Reviewed/Updated  Reviewed/Updated: Reviewed All (Medical, Surgical, Family, Medications, Allergies, Care Teams, Patient Goals)        Objective:    Today's Vitals   12/19/23 1151  BP: (!) 95/53  Pulse: 79  Weight: 229 lb (103.9 kg)  Height: 5' 7 (1.702 m)   Body mass index is 35.87 kg/m.  Current Medications (verified) Outpatient Encounter Medications as of 12/19/2023  Medication Sig   albuterol  (PROVENTIL ) (2.5 MG/3ML) 0.083% nebulizer solution Take 3 mLs (2.5 mg total) by nebulization every 6 (six) hours as needed for wheezing or shortness of breath.   albuterol  (VENTOLIN  HFA) 108 (90 Base) MCG/ACT inhaler Inhale 2 puffs into the lungs every 6 (six) hours as needed for wheezing or shortness of breath.   atorvastatin  (LIPITOR) 80 MG tablet Take 1 tablet (80 mg total) by mouth daily.   bisoprolol  (ZEBETA ) 5 MG tablet Take 1/2 (one-half) tablet by mouth once daily   Blood Glucose Monitoring Suppl (ONETOUCH VERIO FLEX SYSTEM) w/Device KIT Use to test blood sugar daily as directed. DX: E11.65   cetirizine  (ZYRTEC ) 10 MG tablet Take 1 tablet (10 mg total) by mouth daily.   Cholecalciferol  (VITAMIN D3) 125 MCG (5000 UT) TABS Take 5,000 Units by mouth daily.   FARXIGA  10 MG TABS tablet Take 1 tablet by mouth once daily   fluticasone  (FLONASE ) 50 MCG/ACT nasal spray Place 2 sprays into both nostrils daily. (Patient taking differently: Place 2 sprays into both nostrils daily as needed for allergies.)   furosemide  (LASIX ) 40 MG tablet Take 40 mg by mouth daily as needed (fluid retention.).   GEMTESA 75 MG  TABS Take 75 mg by mouth daily.   glucose blood (ONETOUCH VERIO) test strip test blood sugar daily as directed. DX: E11.65   hydrALAZINE  (APRESOLINE ) 50 MG tablet Take 1.5 tablets (75 mg total) by mouth 3 (three) times daily.   isosorbide  dinitrate (ISORDIL ) 20 MG tablet Take 1 tablet (20 mg total) by mouth 3 (three) times daily.   Lancets (ONETOUCH DELICA PLUS LANCET33G) MISC USE 1 LANCET TO CHECK GLUCOSE  ONCE DAILY AS DIRECTED   nitroGLYCERIN  (NITROSTAT ) 0.4 MG SL tablet Place 0.4 mg under the tongue every 5 (five) minutes as needed for chest pain.   omeprazole  (PRILOSEC) 40 MG capsule TAKE 1 CAPSULE BY MOUTH ONCE DAILY 30 TO 60 MINUTES BEFORE BREAKFAST   OVER THE COUNTER MEDICATION Take 3 capsules by mouth daily. Super Beets   roflumilast  (DALIRESP ) 500 MCG TABS tablet Take 1 tablet (500 mcg total) by mouth daily.   sertraline  (ZOLOFT ) 100 MG tablet Take 1 tablet (100 mg total) by mouth daily.   tamsulosin  (FLOMAX ) 0.4 MG CAPS capsule Take 1 capsule (0.4 mg total) by mouth daily after supper.   tirzepatide  (MOUNJARO ) 5 MG/0.5ML Pen INJECT 1/2 (ONE-HALF) ML SUBCUTANEOUSLY  ONCE A WEEK   No facility-administered encounter medications on file as of 12/19/2023.   Hearing/Vision screen No results found. Immunizations and Health Maintenance Health Maintenance  Topic Date Due   COVID-19 Vaccine (1) Never done   OPHTHALMOLOGY EXAM  08/26/2023   Zoster Vaccines- Shingrix (1 of 2) 07/06/2024 (Originally 05/02/1964)   HEMOGLOBIN A1C  05/16/2024   FOOT EXAM  07/06/2024   Diabetic kidney evaluation - Urine ACR  08/23/2024   Diabetic kidney evaluation - eGFR measurement  11/15/2024   Medicare Annual Wellness (AWV)  12/18/2024   Colonoscopy  12/19/2024   DTaP/Tdap/Td (2 - Tdap) 11/09/2027   Pneumococcal Vaccine: 50+ Years  Completed   Influenza Vaccine  Completed   Hepatitis C Screening  Completed   Meningococcal B Vaccine  Aged Out        Assessment/Plan:  This is a routine wellness  examination for Renell.  Patient Care Team: [name], Fonda LABOR, MD as PCP - General (Family Medicine) Croitoru, Jerel, MD as PCP - Cardiology (Cardiology) Ladora Ross Lacy Phebe, MD as Referring Physician (Optometry) Croitoru, Jerel, MD as Consulting Physician (Cardiology) Billee Mliss BIRCH, [facility] (Pharmacist) Rachele Gaynell RAMAN, MD as Consulting Physician (Nephrology) Teressa Toribio SQUIBB, MD (Inactive) as Attending Physician (Gastroenterology) Jude Harden GAILS, MD as Consulting Physician (Pulmonary Disease)  I have personally reviewed and noted the following in the patient's chart:   Medical and social history Use of alcohol, tobacco or illicit drugs  Current medications and supplements including opioid prescriptions. Functional ability and status Nutritional status Physical activity Advanced directives List of other physicians Hospitalizations, surgeries, and ER visits in previous 12 months Vitals Screenings to include cognitive, depression, and falls Referrals and appointments  No orders of the defined types were placed in this encounter.  In addition, I have reviewed and discussed with patient certain preventive protocols, quality metrics, and best practice recommendations. A written personalized care plan for preventive services as well as general preventive health recommendations were provided to patient.   Ozie Ned, CMA   12/19/2023   Return in 1 year (on 12/18/2024).  After Visit Summary: (MyChart) Due to this being a telephonic visit, the after visit summary with patients personalized plan was offered to patient via MyChart   Nurse Notes: email sent diabetic report, will f/u

## 2023-12-27 ENCOUNTER — Other Ambulatory Visit: Payer: Self-pay | Admitting: Family Medicine

## 2024-01-17 ENCOUNTER — Encounter (HOSPITAL_BASED_OUTPATIENT_CLINIC_OR_DEPARTMENT_OTHER): Admitting: Pulmonary Disease

## 2024-02-16 ENCOUNTER — Encounter: Payer: Self-pay | Admitting: Family Medicine

## 2024-02-16 ENCOUNTER — Ambulatory Visit: Payer: Self-pay | Admitting: Family Medicine

## 2024-02-16 VITALS — BP 166/87 | HR 73 | Ht 67.0 in | Wt 232.0 lb

## 2024-02-16 DIAGNOSIS — E1169 Type 2 diabetes mellitus with other specified complication: Secondary | ICD-10-CM

## 2024-02-16 DIAGNOSIS — E785 Hyperlipidemia, unspecified: Secondary | ICD-10-CM

## 2024-02-16 DIAGNOSIS — I10 Essential (primary) hypertension: Secondary | ICD-10-CM

## 2024-02-16 DIAGNOSIS — I5032 Chronic diastolic (congestive) heart failure: Secondary | ICD-10-CM | POA: Diagnosis not present

## 2024-02-16 DIAGNOSIS — Z125 Encounter for screening for malignant neoplasm of prostate: Secondary | ICD-10-CM | POA: Diagnosis not present

## 2024-02-16 DIAGNOSIS — N1832 Chronic kidney disease, stage 3b: Secondary | ICD-10-CM

## 2024-02-16 DIAGNOSIS — E119 Type 2 diabetes mellitus without complications: Secondary | ICD-10-CM

## 2024-02-16 DIAGNOSIS — Z7985 Long-term (current) use of injectable non-insulin antidiabetic drugs: Secondary | ICD-10-CM

## 2024-02-16 DIAGNOSIS — J4489 Other specified chronic obstructive pulmonary disease: Secondary | ICD-10-CM | POA: Diagnosis not present

## 2024-02-16 LAB — BAYER DCA HB A1C WAIVED: HB A1C (BAYER DCA - WAIVED): 5.3 % (ref 4.8–5.6)

## 2024-02-16 NOTE — Progress Notes (Signed)
 "  BP (!) 166/87   Pulse 73   Ht 5' 7 (1.702 m)   Wt 232 lb (105.2 kg)   SpO2 94%   BMI 36.34 kg/m    Subjective:   Patient ID: Kenneth Fuller, male    DOB: Nov 21, 1945, 79 y.o.   MRN: [id number]  HPI: Kenneth Fuller is a 79 y.o. male presenting on 02/16/2024 for Medical Management of Chronic Issues, Chronic Kidney Disease, Diabetes, Hypertension, and Hyperlipidemia   Discussed the use of AI scribe software for clinical note transcription with the patient, who gave verbal consent to proceed.  History of Present Illness   Kenneth Fuller is a 79 year old male with hypertension who presents for a recheck of his blood pressure and other health concerns. He is accompanied by his daughter.  Hypertension - Blood pressure today is 166/87 mmHg. - Does not routinely monitor blood pressure at home and does not own a blood pressure machine. - Attributes today's elevated reading to stress related to his mother's illness.  Psychosocial stress - Experiencing stress related to his mother's illness. - Mother has cirrhosis, diagnosed six years ago with decompensation. - Mother's MELD score recently increased from 17 to 79. - Mother is managing relatively well without requiring extensive care.  Respiratory status - Breathing has improved significantly since starting a new medication. - No recent hospitalizations for respiratory issues. - No fluid retention and has not needed to use diuretics recently.  Lower urinary tract symptoms - Frequent urination attributed to prostate issues. - Experiences urgency but often passes only a small amount of urine. - Under care of a urologist for this condition.  Balance impairment - Experiencing balance issues. - Does not currently engage in exercises to strengthen balance or core muscles.          Relevant past medical, surgical, family and social history reviewed and updated as indicated. Interim medical history since our last  visit reviewed. Allergies and medications reviewed and updated.  Review of Systems  Constitutional:  Negative for chills and fever.  Eyes:  Negative for visual disturbance.  Respiratory:  Negative for shortness of breath and wheezing.   Cardiovascular:  Negative for chest pain and leg swelling.  Musculoskeletal:  Positive for arthralgias and gait problem. Negative for back pain.  Skin:  Negative for rash.  Neurological:  Negative for dizziness and light-headedness.  All other systems reviewed and are negative.   Per HPI unless specifically indicated above   Allergies as of 02/16/2024       Reactions   Amlodipine  Besy-benazepril Hcl Swelling, Other (See Comments)   Makes tongue swell (lotrel)   Hydrocodone  Itching   Can tolerate in low doses   Oxycodone  Itching   Phenergan  [promethazine  Hcl] Other (See Comments)   I can't remember.        Medication List        Accurate as of February 16, 2024  2:00 PM. If you have any questions, ask your nurse or doctor.          albuterol  (2.5 MG/3ML) 0.083% nebulizer solution Commonly known as: PROVENTIL  Take 3 mLs (2.5 mg total) by nebulization every 6 (six) hours as needed for wheezing or shortness of breath.   albuterol  108 (90 Base) MCG/ACT inhaler Commonly known as: VENTOLIN  HFA INHALE 2 PUFFS BY MOUTH EVERY 6 HOURS AS NEEDED FOR WHEEZING FOR SHORTNESS OF BREATH   atorvastatin  80 MG tablet Commonly known as: LIPITOR Take 1 tablet (80 mg total)  by mouth daily.   bisoprolol  5 MG tablet Commonly known as: ZEBETA  Take 1/2 (one-half) tablet by mouth once daily   cetirizine  10 MG tablet Commonly known as: ZYRTEC  Take 1 tablet (10 mg total) by mouth daily.   Farxiga  10 MG Tabs tablet Generic drug: dapagliflozin  propanediol Take 1 tablet by mouth once daily   fluticasone  50 MCG/ACT nasal spray Commonly known as: FLONASE  Place 2 sprays into both nostrils daily. What changed:  when to take this reasons to take this    furosemide  40 MG tablet Commonly known as: LASIX  Take 40 mg by mouth daily as needed (fluid retention.).   Gemtesa 75 MG Tabs Generic drug: Vibegron Take 75 mg by mouth daily.   hydrALAZINE  50 MG tablet Commonly known as: APRESOLINE  Take 1.5 tablets (75 mg total) by mouth 3 (three) times daily.   isosorbide  dinitrate 20 MG tablet Commonly known as: ISORDIL  Take 1 tablet (20 mg total) by mouth 3 (three) times daily.   Mounjaro  5 MG/0.5ML Pen Generic drug: tirzepatide  INJECT 1/2 (ONE-HALF) ML SUBCUTANEOUSLY  ONCE A WEEK   nitroGLYCERIN  0.4 MG SL tablet Commonly known as: NITROSTAT  Place 0.4 mg under the tongue every 5 (five) minutes as needed for chest pain.   omeprazole  40 MG capsule Commonly known as: PRILOSEC TAKE 1 CAPSULE BY MOUTH ONCE DAILY 30 TO 60 MINUTES BEFORE BREAKFAST   OneTouch Delica Plus Lancet33G Misc USE 1 LANCET TO CHECK GLUCOSE ONCE DAILY AS DIRECTED   OneTouch Verio Flex System w/Device Kit Use to test blood sugar daily as directed. DX: E11.65   OneTouch Verio test strip Generic drug: glucose blood test blood sugar daily as directed. DX: E11.65   OVER THE COUNTER MEDICATION Take 3 capsules by mouth daily. Super Beets   roflumilast  500 MCG Tabs tablet Commonly known as: DALIRESP  Take 1 tablet (500 mcg total) by mouth daily.   sertraline  100 MG tablet Commonly known as: ZOLOFT  Take 1 tablet (100 mg total) by mouth daily.   tamsulosin  0.4 MG Caps capsule Commonly known as: FLOMAX  Take 1 capsule (0.4 mg total) by mouth daily after supper.   Vitamin D3 125 MCG (5000 UT) Tabs Take 5,000 Units by mouth daily.         Objective:   BP (!) 166/87   Pulse 73   Ht 5' 7 (1.702 m)   Wt 232 lb (105.2 kg)   SpO2 94%   BMI 36.34 kg/m   Wt Readings from Last 3 Encounters:  02/16/24 232 lb (105.2 kg)  12/19/23 229 lb (103.9 kg)  11/29/23 223 lb (101.2 kg)    Physical Exam Physical Exam   VITALS: BP- 166/87 CHEST: Lungs clear to  auscultation. CARDIOVASCULAR: Heart regular rate and rhythm, no murmurs.         Assessment & Plan:   Problem List Items Addressed This Visit       Cardiovascular and Mediastinum   Essential hypertension   Relevant Orders   Bayer DCA Hb A1c Waived   CBC with Differential/Platelet   CMP14+EGFR   Lipid panel   TSH   CHF (congestive heart failure) (HCC)     Respiratory   Asthma-COPD overlap syndrome (HCC)     Endocrine   Diabetes mellitus type II, non insulin  dependent (HCC)   Hyperlipidemia associated with type 2 diabetes mellitus (HCC)   Relevant Orders   Bayer DCA Hb A1c Waived   CBC with Differential/Platelet   CMP14+EGFR   Lipid panel   TSH  Genitourinary   CKD (chronic kidney disease), stage III (HCC) - Primary   Relevant Orders   Bayer DCA Hb A1c Waived   CBC with Differential/Platelet   CMP14+EGFR   Lipid panel   TSH   Other Visit Diagnoses       Prostate cancer screening       Relevant Orders   PSA, total and free          Essential hypertension Blood pressure elevated at 166/87 mmHg, possibly stress-related. No home monitoring. - Check blood pressure daily at home for two weeks.  Type 2 diabetes mellitus A1c results pending. A1c looks good at 5.3.  Asthma-COPD overlap syndrome Well-controlled with current medication, preventing hospitalizations.  Chronic diastolic congestive heart failure No recent fluid retention or need for diuretics.  Balance impairment Reports occasional balance issues. No regular balance exercises. - Encouraged daily balance exercises, such as chair exercises, leg raises, and core strengthening exercises. - Recommended exploring online resources for balance training exercises for the elderly.          Follow up plan: Return in about 3 months (around 05/16/2024), or if symptoms worsen or fail to improve, for Diabetes.  Counseling provided for all of the vaccine components Orders Placed This Encounter   Procedures   Bayer DCA Hb A1c Waived   CBC with Differential/Platelet   CMP14+EGFR   Lipid panel   TSH   PSA, total and free    Fonda Levins, MD [facility] 02/16/2024, 2:00 PM     "

## 2024-02-17 LAB — CBC WITH DIFFERENTIAL/PLATELET
Basophils Absolute: 0.1 x10E3/uL (ref 0.0–0.2)
Basos: 1 %
EOS (ABSOLUTE): 0.1 x10E3/uL (ref 0.0–0.4)
Eos: 2 %
Hematocrit: 47.5 % (ref 37.5–51.0)
Hemoglobin: 14.9 g/dL (ref 13.0–17.7)
Immature Grans (Abs): 0 x10E3/uL (ref 0.0–0.1)
Immature Granulocytes: 0 %
Lymphocytes Absolute: 2 x10E3/uL (ref 0.7–3.1)
Lymphs: 30 %
MCH: 26.2 pg — ABNORMAL LOW (ref 26.6–33.0)
MCHC: 31.4 g/dL — ABNORMAL LOW (ref 31.5–35.7)
MCV: 84 fL (ref 79–97)
Monocytes Absolute: 0.6 x10E3/uL (ref 0.1–0.9)
Monocytes: 9 %
Neutrophils Absolute: 3.8 x10E3/uL (ref 1.4–7.0)
Neutrophils: 58 %
Platelets: 214 x10E3/uL (ref 150–450)
RBC: 5.68 x10E6/uL (ref 4.14–5.80)
RDW: 14.4 % (ref 11.6–15.4)
WBC: 6.5 x10E3/uL (ref 3.4–10.8)

## 2024-02-17 LAB — LIPID PANEL
Chol/HDL Ratio: 1.7 ratio (ref 0.0–5.0)
Cholesterol, Total: 89 mg/dL — ABNORMAL LOW (ref 100–199)
HDL: 53 mg/dL
LDL Chol Calc (NIH): 20 mg/dL (ref 0–99)
Triglycerides: 74 mg/dL (ref 0–149)
VLDL Cholesterol Cal: 16 mg/dL (ref 5–40)

## 2024-02-17 LAB — CMP14+EGFR
ALT: 29 IU/L (ref 0–44)
AST: 24 IU/L (ref 0–40)
Albumin: 4.5 g/dL (ref 3.8–4.8)
Alkaline Phosphatase: 96 IU/L (ref 47–123)
BUN/Creatinine Ratio: 12 (ref 10–24)
BUN: 16 mg/dL (ref 8–27)
Bilirubin Total: 0.6 mg/dL (ref 0.0–1.2)
CO2: 23 mmol/L (ref 20–29)
Calcium: 9.3 mg/dL (ref 8.6–10.2)
Chloride: 104 mmol/L (ref 96–106)
Creatinine, Ser: 1.31 mg/dL — ABNORMAL HIGH (ref 0.76–1.27)
Globulin, Total: 1.9 g/dL (ref 1.5–4.5)
Glucose: 97 mg/dL (ref 70–99)
Potassium: 5.7 mmol/L — ABNORMAL HIGH (ref 3.5–5.2)
Sodium: 143 mmol/L (ref 134–144)
Total Protein: 6.4 g/dL (ref 6.0–8.5)
eGFR: 56 mL/min/1.73 — ABNORMAL LOW

## 2024-02-17 LAB — TSH: TSH: 2.02 u[IU]/mL (ref 0.450–4.500)

## 2024-02-17 LAB — PSA, TOTAL AND FREE
PSA, Free Pct: 55 %
PSA, Free: 0.11 ng/mL
Prostate Specific Ag, Serum: 0.2 ng/mL (ref 0.0–4.0)

## 2024-02-20 ENCOUNTER — Telehealth: Payer: Self-pay | Admitting: Family Medicine

## 2024-02-20 NOTE — Telephone Encounter (Signed)
 Copied from CRM [id number]. Topic: Clinical - Request for Lab/Test Order >> Feb 20, 2024  9:27 AM Carlyon D wrote: Reason for CRM: Pt is calling in regards to some lab orders he is stating his kidney Dr. Is requesting he said they faxed over some lab orders they want completed. Pt is asking if those can be put in today by his appt time as he will be there to see the in office pharmacist. Please call pt to schedule orders if received.

## 2024-02-20 NOTE — Telephone Encounter (Signed)
 Copied from CRM [id number]. Topic: General - Other >> Feb 20, 2024  9:33 AM Carlyon D wrote: Reason for CRM: Pt also calling to state he was told to monitor blood pressure.  Yesterday it was 175/95  Today currently is better 151/87  Please reach out to pt if needed  as he was told to send his readings over.

## 2024-02-20 NOTE — Telephone Encounter (Signed)
 I do not have anything in my in basket such as a fax but you can check with the lab and see if they have orders in from the doctor for [facility] orders, if not the doctor will have to place them in as [facility] orders and if he puts them in as [facility] orders then he then they can be done here as well because this is a [facility] lab site

## 2024-02-20 NOTE — Telephone Encounter (Addendum)
 Per [place] the lab has his orders. Pt made aware. Pt coming in 1/12 to have drawn.

## 2024-02-20 NOTE — Telephone Encounter (Signed)
 Pt made aware of Dr. Williemae recommendations. Pt admitted to drinking lots of tea and coffee. Advised to restrict those and drink more water instead.  Pt also wants to know what would cause severe night sweats/ He forgot to mention at his appt. He has to change clothes and bed sheets almost every night.

## 2024-02-20 NOTE — Telephone Encounter (Signed)
 Have him continue to check his blood pressure once or twice a day for the next 2 weeks and let me know give me all those numbers at once so I can get a good average

## 2024-02-21 ENCOUNTER — Other Ambulatory Visit

## 2024-02-21 ENCOUNTER — Ambulatory Visit (INDEPENDENT_AMBULATORY_CARE_PROVIDER_SITE_OTHER): Payer: Self-pay | Admitting: Pharmacist

## 2024-02-21 VITALS — BP 129/71 | HR 66

## 2024-02-21 DIAGNOSIS — E119 Type 2 diabetes mellitus without complications: Secondary | ICD-10-CM | POA: Diagnosis not present

## 2024-02-21 DIAGNOSIS — Z7985 Long-term (current) use of injectable non-insulin antidiabetic drugs: Secondary | ICD-10-CM

## 2024-02-21 DIAGNOSIS — I2510 Atherosclerotic heart disease of native coronary artery without angina pectoris: Secondary | ICD-10-CM | POA: Diagnosis not present

## 2024-02-21 MED ORDER — NITROGLYCERIN 0.4 MG SL SUBL: 0.4000 mg | SUBLINGUAL_TABLET | SUBLINGUAL | 3 refills | Status: AC | PRN

## 2024-02-21 MED ORDER — TIRZEPATIDE 7.5 MG/0.5ML ~~LOC~~ SOAJ: 7.5000 mg | SUBCUTANEOUS | 2 refills | Status: AC

## 2024-02-21 NOTE — Progress Notes (Unsigned)
 "  02/21/2024 Name: Kenneth Fuller MRN: [id number] DOB: 05/05/1945  Chief Complaint  Patient presents with   Diabetes   Kenneth Fuller is a 79 y.o. year old male who was referred for medication management by their primary care provider, [name], Fonda LABOR, MD. They presented for a face to face visit today.   They were referred to the pharmacist by their PCP for assistance in managing diabetes and complex medication management   Subjective: Patient presents in-person for diabetes and medication management appointment. He reports to be doing well. He recently saw Dr. Maryanne on 02/16/24. During that visit, patient had an elevated blood pressure. He attributes his elevated blood pressure to stress. Reported not owning a blood pressure machine. Of note, no medication changes were made. Today, patient reported purchasing a new blood pressure. His home readings were still elevated with symptoms of lightheadedness. Patient has been tolerating Mounjaro  5 mg once weekly well. Interested in increasing Mounjaro  to the next available dose. Of note, he is planing to decrease his consumption of tea and coffee. Reviewing medication list with patient and reordered refills as necessary.  Care Team: Primary Care Provider: [name], Fonda LABOR, MD ; Next Scheduled Visit: 05/16/24 Sleep Medicine Provider: Jude Harden GAILS, MD; Next Scheduled Visit 03/22/24 Pulmonology Provider: Jude Harden GAILS, MD; Next Scheduled Visit: 04/23/24  Medication Access/Adherence  Current Pharmacy:  [facility] [address], [place] - [address] [place] [address] Phone: [phone] Fax: [phone]  [facility] - [place], [place] - [address] [place] [address] Phone: [phone] Fax: [phone]   Patient reports affordability concerns with their medications: No  Patient reports access/transportation concerns to their pharmacy: No  Patient reports adherence concerns  with their medications:  No   Diabetes: Current medications: Farxiga  10 mg daily, Mounjaro  5 mg once weekly Medications tried in the past: Invokana , Jardiance , glimepiride , glipizide , metformin , Levemir , Januvia   Statin: atorvastatin  80 mg daily LDL of 20 on 02/16/24 ACEi/ARB: none, noted allergy  to benazepril (angioedema) UACR of 55 on 71/6/25; patient is due for an updated UACR eGFR 56 mL/min  A1c of 5.3% on 02/16/24, up from 5.1% on 11/16/23  Current glucose readings: patient has not been checking recently; has supplies on hand  Patient denies hypoglycemic s/sx including dizziness, shakiness, sweating.   Patient denies hyperglycemic symptoms including polyuria, polydipsia, polyphagia, nocturia, neuropathy, blurred vision.  Current meal patterns: plans to start cutting down on tea and coffee Avoid sugary drinks and desserts Incorporate balanced protein, non starchy veggies, 1 serving of carbohydrate with each meal Increase water intake  Current physical activity: increase as able  Current medication access support: [employer]  Objective:  Lab Results  Component Value Date   HGBA1C 5.3 02/16/2024    Lab Results  Component Value Date   CREATININE 1.31 (H) 02/16/2024   BUN 16 02/16/2024   NA 143 02/16/2024   K 5.7 (H) 02/16/2024   CL 104 02/16/2024   CO2 23 02/16/2024    Lab Results  Component Value Date   CHOL 89 (L) 02/16/2024   HDL 53 02/16/2024   LDLCALC 20 02/16/2024   TRIG 74 02/16/2024   CHOLHDL 1.7 02/16/2024    Medications Reviewed Today     Reviewed by Mamie Jenkins HERO, RPH (Pharmacist) on 02/21/24 at 1326  Med List Status: <None>   Medication Order Taking? Sig Documenting Provider Last Dose Status Informant  albuterol  (PROVENTIL ) (2.5  MG/3ML) 0.083% nebulizer solution [id number] Yes Take 3 mLs (2.5 mg total) by nebulization every 6 (six) hours as needed for wheezing or shortness of breath. [name], Fonda LABOR, MD  Active   albuterol  (VENTOLIN  HFA)  108 [phone] Base) MCG/ACT inhaler [id number] Yes INHALE 2 PUFFS BY MOUTH EVERY 6 HOURS AS NEEDED FOR WHEEZING FOR SHORTNESS OF BREATH [name], Fonda LABOR, MD  Active   atorvastatin  (LIPITOR) 80 MG tablet [id number] Yes Take 1 tablet (80 mg total) by mouth daily. [name], Fonda LABOR, MD  Active   bisoprolol  (ZEBETA ) 5 MG tablet [id number] Yes Take 1/2 (one-half) tablet by mouth once daily [name], Fonda LABOR, MD  Active   Blood Glucose Monitoring Suppl (ONETOUCH VERIO FLEX SYSTEM) w/Device KIT [id number] Yes Use to test blood sugar daily as directed. DX: E11.65 [name], Fonda LABOR, MD  Active Self  cetirizine  (ZYRTEC ) 10 MG tablet [id number] Yes Take 1 tablet (10 mg total) by mouth daily. [name], Fonda LABOR, MD  Active   Cholecalciferol  (VITAMIN D3) 125 MCG (5000 UT) TABS [id number] Yes Take 5,000 Units by mouth daily. [provider]  Active Self  FARXIGA  10 MG TABS tablet [id number] Yes Take 1 tablet by mouth once daily [name], Joshua A, MD  Active   fluticasone  (FLONASE ) 50 MCG/ACT nasal spray [id number] Yes Place 2 sprays into both nostrils daily. Lavell Bari LABOR, FNP  Active Self    Discontinued 02/21/24 1324 (Patient Preference) GEMTESA 75 MG TABS [id number] Yes Take 75 mg by mouth daily. [provider]  Active Self  glucose blood (ONETOUCH VERIO) test strip [id number] Yes test blood sugar daily as directed. DX: E11.65 [name], Fonda LABOR, MD  Active   hydrALAZINE  (APRESOLINE ) 100 MG tablet [id number] Yes Take 100 mg by mouth 3 (three) times daily. [provider]  Active    Patient not taking:   Discontinued 02/21/24 1317 (Dose change)   isosorbide  dinitrate (ISORDIL ) 20 MG tablet [id number] Yes Take 1 tablet (20 mg total) by mouth 3 (three) times daily. [name], Fonda LABOR, MD  Active   Lancets [facility] DELICA PLUS [place]) MISC [id number] Yes USE 1 LANCET TO CHECK GLUCOSE ONCE DAILY AS DIRECTED [name], Fonda LABOR, MD  Active   meloxicam (MOBIC) 15 MG tablet [id number] Yes  Take 15 mg by mouth as needed for pain. [provider]  Active   nitroGLYCERIN  (NITROSTAT ) 0.4 MG SL tablet [id number] Yes Place 0.4 mg under the tongue every 5 (five) minutes as needed for chest pain. [provider]  Active Self  omeprazole  (PRILOSEC) 40 MG capsule [id number] Yes TAKE 1 CAPSULE BY MOUTH ONCE DAILY 30 TO 60 MINUTES BEFORE BREAKFAST [name], Fonda LABOR, MD  Active   OVER THE COUNTER MEDICATION [id number] Yes Take 3 capsules by mouth daily. Super Beets [provider]  Active Self  roflumilast  (DALIRESP ) 500 MCG TABS tablet [id number] Yes Take 1 tablet (500 mcg total) by mouth daily. [name], Fonda LABOR, MD  Active   sertraline  (ZOLOFT ) 100 MG tablet [id number] Yes Take 1 tablet (100 mg total) by mouth daily. [name], Fonda LABOR, MD  Active   tamsulosin  (FLOMAX ) 0.4 MG CAPS capsule [id number] Yes Take 1 capsule (0.4 mg total) by mouth daily after supper. [name], Fonda LABOR, MD  Active    Discontinued 02/21/24 1322 (Dose change)              Assessment/Plan:  Diabetes: Currently controlled. Goal of < 7% Cardiorenal risk reduction is opportunities for improvement. Blood pressure is not at goal of <130/80  mmHg LDL is at goal of 70 mg/dL Reviewed long term cardiovascular and renal outcomes of uncontrolled blood sugar Reviewed goal A1c, goal fasting, and goal 2 hour post prandial glucose Reviewed dietary modifications including reducing tea and coffee Recommend to:  Increase Mounjaro  to 7.5 mg once weekly Continue Farxiga  10 mg daily Recommend to check glucose once daily Future Considerations:  Blood pressure management: history of soft BP and ACEi allergy  - recent BP have been elevated Patient following up with cardiology for further management Patient denies personal or family history of multiple endocrine neoplasia type 2, medullary thyroid  cancer; personal history of pancreatitis or gallbladder disease., Discussed side effects of gastrointestinal  upset/nausea; eating smaller meals, avoiding high-fat foods, and remaining upright after eating may reduce nausea. Discussed that overeating is a major trigger of nausea with this class of medications, as often times patients will start to feel full sooner and may need to decrease portion sizes from what they were previously accustomed to.   Follow Up Plan: PharmD: as needed for blood sugars PCP: 05/16/24 with Dr. Maryanne Jenkins Graces, PharmD PGY1 Pharmacy Resident  Mliss Tarry Griffin, PharmD, BCACP, CPP Clinical Pharmacist, [facility]   "

## 2024-02-22 NOTE — Telephone Encounter (Signed)
 Pt made aware of Dr. Williemae recommendations. He will let us  know if his blood sugar is running too high or low during the night sweats. He said these symptoms are nothing new.

## 2024-02-22 NOTE — Telephone Encounter (Signed)
 When he is feeling those sweats, also check his blood sugar to make sure that it is not off.  Yes drink more water especially in the evenings.  Let us  know if he has any other symptoms such as chest pain or breathing issues along with the night sweats.

## 2024-02-24 ENCOUNTER — Ambulatory Visit: Payer: Self-pay | Admitting: Family Medicine

## 2024-02-24 ENCOUNTER — Other Ambulatory Visit: Payer: Self-pay

## 2024-02-24 DIAGNOSIS — E875 Hyperkalemia: Secondary | ICD-10-CM

## 2024-02-28 ENCOUNTER — Telehealth: Payer: Self-pay

## 2024-02-28 ENCOUNTER — Other Ambulatory Visit

## 2024-02-28 ENCOUNTER — Ambulatory Visit: Admitting: *Deleted

## 2024-02-28 VITALS — BP 122/67 | HR 78

## 2024-02-28 DIAGNOSIS — E875 Hyperkalemia: Secondary | ICD-10-CM

## 2024-02-28 DIAGNOSIS — I10 Essential (primary) hypertension: Secondary | ICD-10-CM

## 2024-02-28 NOTE — Telephone Encounter (Signed)
 Pt states that his Gemtesa prescription is $129 dollars for a 30d supply.  He would like to stay on the medication because it has been very helpful for his overactive bladder.  Is there a prior auth to help with the cost? Or is the medication covered by insurance and the pt's part is going to be $129?  Forwarded to the PA team for help.

## 2024-02-28 NOTE — Progress Notes (Signed)
 Patient is in office today for a nurse visit for Blood Pressure Check. Patient is having no cardiac sxs, his BP machine is reading in the 190-200/100-110 at home. Compared home monitor and it read here at 133/73 p 78. Instructed pt to continue to monitor at home w/ arm more level at heart level to see if his reading are better this way. If he gets anymore high readings to call the office to see if he can come see the nurse for a BP check again to make sure he his not truly reading that high.

## 2024-02-29 ENCOUNTER — Ambulatory Visit: Payer: Self-pay | Admitting: Family Medicine

## 2024-02-29 LAB — BASIC METABOLIC PANEL WITH GFR
BUN/Creatinine Ratio: 14 (ref 10–24)
BUN: 21 mg/dL (ref 8–27)
CO2: 19 mmol/L — AB (ref 20–29)
Calcium: 8.9 mg/dL (ref 8.6–10.2)
Chloride: 104 mmol/L (ref 96–106)
Creatinine, Ser: 1.48 mg/dL — AB (ref 0.76–1.27)
Glucose: 99 mg/dL (ref 70–99)
Potassium: 4.1 mmol/L (ref 3.5–5.2)
Sodium: 140 mmol/L (ref 134–144)
eGFR: 48 mL/min/1.73 — AB

## 2024-03-02 ENCOUNTER — Other Ambulatory Visit (HOSPITAL_COMMUNITY): Payer: Self-pay

## 2024-03-02 NOTE — Telephone Encounter (Signed)
Pt made aware and understood. He has no further concerns.

## 2024-03-06 ENCOUNTER — Other Ambulatory Visit: Payer: Self-pay | Admitting: Family Medicine

## 2024-03-06 DIAGNOSIS — E1142 Type 2 diabetes mellitus with diabetic polyneuropathy: Secondary | ICD-10-CM

## 2024-03-06 DIAGNOSIS — N1832 Chronic kidney disease, stage 3b: Secondary | ICD-10-CM

## 2024-03-15 ENCOUNTER — Ambulatory Visit: Payer: Self-pay

## 2024-03-15 NOTE — Telephone Encounter (Signed)
 Noted. Patient scheduled to be seen tomorrow for symptoms.

## 2024-03-15 NOTE — Telephone Encounter (Signed)
 FYI Only or Action Required?: FYI only for provider: appointment scheduled on 2/6.  Patient was last seen in primary care on 02/16/2024 by [name], Fonda LABOR, MD.  Called Nurse Triage reporting Sore Throat.  Symptoms began today.  Interventions attempted: Nothing.  Symptoms are: gradually worsening.  Triage Disposition: See Physician Within 24 Hours  Patient/caregiver understands and will follow disposition?: Yes, will follow disposition  Reason for Triage: throat red and swollen, hard to swallow    Reason for Disposition  Diabetes mellitus or weak immune system (e.g., HIV positive, cancer chemo, splenectomy, organ transplant, chronic steroids)  Answer Assessment - Initial Assessment Questions 1. ONSET: When did the throat start hurting? (Hours or days ago)      Woke today with sore throat  2. SEVERITY: How bad is the sore throat? (Scale 1-10; mild, moderate or severe)     5 3. STREP EXPOSURE: Has there been any exposure to strep within the past week? If Yes, ask: What type of contact occurred?      denies 4.  VIRAL SYMPTOMS: Are there any symptoms of a cold, such as a runny nose, cough, hoarse voice or red eyes?      denies 5. FEVER: Do you have a fever? If Yes, ask: What is your temperature, how was it measured, and when did it start?     denies 7. OTHER SYMPTOMS: Do you have any other symptoms? (e.g., difficulty breathing, headache, rash)     Congestion in head  Protocols used: Sore Throat-A-AH

## 2024-03-16 ENCOUNTER — Encounter: Payer: Self-pay | Admitting: Family Medicine

## 2024-03-16 ENCOUNTER — Ambulatory Visit: Admitting: Family Medicine

## 2024-03-16 VITALS — BP 122/63 | HR 77 | Temp 98.4°F | Ht 67.0 in | Wt 231.4 lb

## 2024-03-16 DIAGNOSIS — J0140 Acute pansinusitis, unspecified: Secondary | ICD-10-CM

## 2024-03-16 DIAGNOSIS — J029 Acute pharyngitis, unspecified: Secondary | ICD-10-CM

## 2024-03-16 MED ORDER — AMOXICILLIN 875 MG PO TABS: 875.0000 mg | ORAL_TABLET | Freq: Two times a day (BID) | ORAL | 0 refills | Status: AC

## 2024-03-16 NOTE — Progress Notes (Signed)
 "  Acute Office Visit  Subjective:     Patient ID: Kenneth Kenneth, male    DOB: Jul 08, 1945, 79 y.o.   MRN: [id number]  Chief Complaint  Patient presents with   Sore Throat    Sore Throat     History of Present Illness   CONSTANT Kenneth Kenneth is a 79 year old male who presents with head congestion, sore throat, and sinus drainage.  Upper respiratory symptoms - Head congestion, sore throat, and sinus drainage for approximately one week - Symptoms less severe than previous day but not significantly improved - No fever - Chills present - No cough - No ear pain - No exposure to sick contacts  Oropharyngeal symptoms - Swollen tonsils with difficulty talking the previous day - Uvula described as three to four times normal size yesterday, now improved - Difficulty swallowing yesterday, now improved - No white patches in throat  Sinus and headache symptoms - Sinus pressure and headaches, especially around the forehead  Gastrointestinal symptoms - No nausea, vomiting, or diarrhea  Symptom management - Chloraseptic spray and Mucinex  provide some relief - Takes a daily allergy  pill       ROS As per HPI.      Objective:    BP 122/63   Pulse 77   Temp 98.4 F (36.9 C) (Temporal)   Ht 5' 7 (1.702 m)   Wt 231 lb 6.4 oz (105 kg)   SpO2 96%   BMI 36.24 kg/m    Physical Exam Vitals and nursing note reviewed.  Constitutional:      General: He is not in acute distress.    Appearance: He is not ill-appearing, toxic-appearing or diaphoretic.  HENT:     Head: Normocephalic and atraumatic.     Right Ear: Tympanic membrane and ear canal normal.     Left Ear: Tympanic membrane and ear canal normal.     Nose: Congestion present.     Right Sinus: No maxillary sinus tenderness or frontal sinus tenderness.     Left Sinus: No maxillary sinus tenderness or frontal sinus tenderness.     Mouth/Throat:     Lips: No lesions.     Mouth: Mucous membranes are moist.      Tongue: No lesions.     Pharynx: Posterior oropharyngeal erythema present. No pharyngeal swelling, oropharyngeal exudate, uvula swelling or postnasal drip.     Tonsils: No tonsillar exudate or tonsillar abscesses. 2+ on the right. 2+ on the left.  Eyes:     General:        Right eye: No discharge.        Left eye: No discharge.  Cardiovascular:     Rate and Rhythm: Normal rate and regular rhythm.     Heart sounds: Normal heart sounds. No murmur heard. Pulmonary:     Effort: No respiratory distress.     Breath sounds: Normal breath sounds. No stridor. No wheezing, rhonchi or rales.  Musculoskeletal:     Cervical back: Neck supple.     Right lower leg: No edema.     Left lower leg: No edema.  Lymphadenopathy:     Cervical: No cervical adenopathy.  Skin:    General: Skin is warm and dry.  Neurological:     General: No focal deficit present.     Mental Status: He is alert and oriented to person, place, and time.  Psychiatric:        Mood and Affect: Mood normal.  Behavior: Behavior normal.     No results found for any visits on 03/16/24.      Assessment & Plan:   Kenneth Kenneth was seen today for sore throat.  Diagnoses and all orders for this visit:  Acute non-recurrent pansinusitis -     amoxicillin  (AMOXIL ) 875 MG tablet; Take 1 tablet (875 mg total) by mouth 2 (two) times daily for 7 days.  Pharyngitis, unspecified etiology -     amoxicillin  (AMOXIL ) 875 MG tablet; Take 1 tablet (875 mg total) by mouth 2 (two) times daily for 7 days.   Assessment and Plan    Acute pansinusitis Pharyngitis - Prescribed amoxicillin  BID for 7 days. - Sent prescription to [facility]. - Advised to contact if no improvement in a couple of days.  - Go to ER for difficulty breathing       Return if symptoms worsen or fail to improve.  Kenneth CHRISTELLA Search, FNP   "

## 2024-03-22 ENCOUNTER — Ambulatory Visit (HOSPITAL_BASED_OUTPATIENT_CLINIC_OR_DEPARTMENT_OTHER): Admitting: Pulmonary Disease

## 2024-04-23 ENCOUNTER — Ambulatory Visit (HOSPITAL_BASED_OUTPATIENT_CLINIC_OR_DEPARTMENT_OTHER): Admitting: Pulmonary Disease

## 2024-05-16 ENCOUNTER — Ambulatory Visit: Admitting: Family Medicine

## 2024-12-20 ENCOUNTER — Ambulatory Visit
